# Patient Record
Sex: Female | Born: 1950 | Race: White | Hispanic: No | Marital: Single | Smoking: Former smoker
Health system: Southern US, Community
[De-identification: ages and names within clinical notes are randomized; demographics above are authoritative.]

## PROBLEM LIST (undated history)

## (undated) DIAGNOSIS — J449 Chronic obstructive pulmonary disease, unspecified: Secondary | ICD-10-CM

## (undated) DIAGNOSIS — K219 Gastro-esophageal reflux disease without esophagitis: Secondary | ICD-10-CM

## (undated) DIAGNOSIS — Z5189 Encounter for other specified aftercare: Secondary | ICD-10-CM

## (undated) DIAGNOSIS — Z9889 Other specified postprocedural states: Secondary | ICD-10-CM

## (undated) DIAGNOSIS — U071 COVID-19: Secondary | ICD-10-CM

## (undated) DIAGNOSIS — N3010 Interstitial cystitis (chronic) without hematuria: Secondary | ICD-10-CM

## (undated) DIAGNOSIS — N189 Chronic kidney disease, unspecified: Secondary | ICD-10-CM

## (undated) DIAGNOSIS — R350 Frequency of micturition: Secondary | ICD-10-CM

## (undated) DIAGNOSIS — R112 Nausea with vomiting, unspecified: Secondary | ICD-10-CM

## (undated) DIAGNOSIS — D649 Anemia, unspecified: Secondary | ICD-10-CM

## (undated) DIAGNOSIS — N390 Urinary tract infection, site not specified: Secondary | ICD-10-CM

## (undated) DIAGNOSIS — M797 Fibromyalgia: Secondary | ICD-10-CM

## (undated) DIAGNOSIS — G453 Amaurosis fugax: Secondary | ICD-10-CM

## (undated) DIAGNOSIS — F32A Depression, unspecified: Secondary | ICD-10-CM

## (undated) DIAGNOSIS — T7840XA Allergy, unspecified, initial encounter: Secondary | ICD-10-CM

## (undated) DIAGNOSIS — G9332 Myalgic encephalomyelitis/chronic fatigue syndrome: Secondary | ICD-10-CM

## (undated) DIAGNOSIS — Z8719 Personal history of other diseases of the digestive system: Secondary | ICD-10-CM

## (undated) DIAGNOSIS — G43909 Migraine, unspecified, not intractable, without status migrainosus: Secondary | ICD-10-CM

## (undated) DIAGNOSIS — E785 Hyperlipidemia, unspecified: Secondary | ICD-10-CM

## (undated) DIAGNOSIS — Z8709 Personal history of other diseases of the respiratory system: Secondary | ICD-10-CM

## (undated) DIAGNOSIS — R5382 Chronic fatigue, unspecified: Secondary | ICD-10-CM

## (undated) DIAGNOSIS — R0602 Shortness of breath: Secondary | ICD-10-CM

## (undated) DIAGNOSIS — K5792 Diverticulitis of intestine, part unspecified, without perforation or abscess without bleeding: Secondary | ICD-10-CM

## (undated) DIAGNOSIS — F329 Major depressive disorder, single episode, unspecified: Secondary | ICD-10-CM

## (undated) DIAGNOSIS — E079 Disorder of thyroid, unspecified: Secondary | ICD-10-CM

## (undated) DIAGNOSIS — E039 Hypothyroidism, unspecified: Secondary | ICD-10-CM

## (undated) DIAGNOSIS — R42 Dizziness and giddiness: Secondary | ICD-10-CM

## (undated) DIAGNOSIS — M5126 Other intervertebral disc displacement, lumbar region: Secondary | ICD-10-CM

## (undated) DIAGNOSIS — J189 Pneumonia, unspecified organism: Secondary | ICD-10-CM

## (undated) DIAGNOSIS — M1990 Unspecified osteoarthritis, unspecified site: Secondary | ICD-10-CM

## (undated) DIAGNOSIS — K589 Irritable bowel syndrome without diarrhea: Secondary | ICD-10-CM

## (undated) DIAGNOSIS — F419 Anxiety disorder, unspecified: Secondary | ICD-10-CM

## (undated) DIAGNOSIS — L900 Lichen sclerosus et atrophicus: Secondary | ICD-10-CM

## (undated) DIAGNOSIS — J439 Emphysema, unspecified: Secondary | ICD-10-CM

## (undated) HISTORY — DX: Hyperlipidemia, unspecified: E78.5

## (undated) HISTORY — DX: Chronic kidney disease, unspecified: N18.9

## (undated) HISTORY — DX: Depression, unspecified: F32.A

## (undated) HISTORY — DX: Encounter for other specified aftercare: Z51.89

## (undated) HISTORY — PX: TONSILLECTOMY: SUR1361

## (undated) HISTORY — PX: TEMPOROMANDIBULAR JOINT SURGERY: SHX35

## (undated) HISTORY — DX: Chronic fatigue, unspecified: R53.82

## (undated) HISTORY — DX: Emphysema, unspecified: J43.9

## (undated) HISTORY — PX: WRIST ARTHROSCOPY: SUR100

## (undated) HISTORY — DX: Major depressive disorder, single episode, unspecified: F32.9

## (undated) HISTORY — DX: Amaurosis fugax: G45.3

## (undated) HISTORY — PX: TRIGGER FINGER RELEASE: SHX641

## (undated) HISTORY — PX: PLANTAR FASCIA SURGERY: SHX746

## (undated) HISTORY — DX: COVID-19: U07.1

## (undated) HISTORY — PX: JOINT REPLACEMENT: SHX530

## (undated) HISTORY — DX: Chronic obstructive pulmonary disease, unspecified: J44.9

## (undated) HISTORY — DX: Other intervertebral disc displacement, lumbar region: M51.26

## (undated) HISTORY — DX: Gastro-esophageal reflux disease without esophagitis: K21.9

## (undated) HISTORY — PX: DOPPLER ECHOCARDIOGRAPHY: SHX263

## (undated) HISTORY — DX: Migraine, unspecified, not intractable, without status migrainosus: G43.909

## (undated) HISTORY — DX: Pneumonia, unspecified organism: J18.9

## (undated) HISTORY — PX: ABDOMINAL HYSTERECTOMY: SHX81

## (undated) HISTORY — PX: EYE SURGERY: SHX253

## (undated) HISTORY — DX: Irritable bowel syndrome, unspecified: K58.9

## (undated) HISTORY — PX: FOOT SURGERY: SHX648

## (undated) HISTORY — DX: Disorder of thyroid, unspecified: E07.9

## (undated) HISTORY — DX: Allergy, unspecified, initial encounter: T78.40XA

## (undated) HISTORY — DX: Myalgic encephalomyelitis/chronic fatigue syndrome: G93.32

## (undated) HISTORY — PX: OTHER SURGICAL HISTORY: SHX169

## (undated) HISTORY — PX: KNEE SURGERY: SHX244

## (undated) HISTORY — DX: Anxiety disorder, unspecified: F41.9

---

## 1998-05-10 ENCOUNTER — Emergency Department (HOSPITAL_COMMUNITY): Admission: EM | Admit: 1998-05-10 | Discharge: 1998-05-10 | Payer: Self-pay | Admitting: Emergency Medicine

## 1998-05-10 ENCOUNTER — Encounter: Payer: Self-pay | Admitting: Emergency Medicine

## 1999-03-30 ENCOUNTER — Encounter: Payer: Self-pay | Admitting: Family Medicine

## 1999-03-30 ENCOUNTER — Inpatient Hospital Stay (HOSPITAL_COMMUNITY): Admission: AD | Admit: 1999-03-30 | Discharge: 1999-04-01 | Payer: Self-pay | Admitting: Family Medicine

## 1999-07-12 ENCOUNTER — Encounter: Admission: RE | Admit: 1999-07-12 | Discharge: 1999-08-01 | Payer: Self-pay | Admitting: Family Medicine

## 2000-01-12 ENCOUNTER — Encounter: Payer: Self-pay | Admitting: Family Medicine

## 2000-01-12 ENCOUNTER — Encounter: Admission: RE | Admit: 2000-01-12 | Discharge: 2000-01-12 | Payer: Self-pay | Admitting: Family Medicine

## 2001-01-20 ENCOUNTER — Encounter: Payer: Self-pay | Admitting: Family Medicine

## 2001-01-20 ENCOUNTER — Encounter: Admission: RE | Admit: 2001-01-20 | Discharge: 2001-01-20 | Payer: Self-pay | Admitting: Family Medicine

## 2001-03-07 ENCOUNTER — Emergency Department (HOSPITAL_COMMUNITY): Admission: EM | Admit: 2001-03-07 | Discharge: 2001-03-07 | Payer: Self-pay

## 2002-01-26 ENCOUNTER — Encounter: Payer: Self-pay | Admitting: Family Medicine

## 2002-01-26 ENCOUNTER — Encounter: Admission: RE | Admit: 2002-01-26 | Discharge: 2002-01-26 | Payer: Self-pay | Admitting: Family Medicine

## 2002-10-09 ENCOUNTER — Emergency Department (HOSPITAL_COMMUNITY): Admission: EM | Admit: 2002-10-09 | Discharge: 2002-10-10 | Payer: Self-pay | Admitting: Emergency Medicine

## 2003-01-13 ENCOUNTER — Encounter: Admission: RE | Admit: 2003-01-13 | Discharge: 2003-01-13 | Payer: Self-pay | Admitting: Family Medicine

## 2003-12-31 ENCOUNTER — Ambulatory Visit: Payer: Self-pay | Admitting: Family Medicine

## 2004-01-07 ENCOUNTER — Encounter: Admission: RE | Admit: 2004-01-07 | Discharge: 2004-01-07 | Payer: Self-pay | Admitting: Family Medicine

## 2004-01-25 ENCOUNTER — Ambulatory Visit: Payer: Self-pay | Admitting: Family Medicine

## 2004-01-28 ENCOUNTER — Ambulatory Visit: Payer: Self-pay | Admitting: Family Medicine

## 2004-04-03 ENCOUNTER — Ambulatory Visit: Payer: Self-pay | Admitting: Family Medicine

## 2004-04-04 ENCOUNTER — Encounter: Admission: RE | Admit: 2004-04-04 | Discharge: 2004-04-04 | Payer: Self-pay | Admitting: Family Medicine

## 2004-05-17 ENCOUNTER — Ambulatory Visit: Payer: Self-pay | Admitting: Family Medicine

## 2004-08-16 ENCOUNTER — Ambulatory Visit: Payer: Self-pay | Admitting: Family Medicine

## 2004-10-03 ENCOUNTER — Ambulatory Visit: Payer: Self-pay | Admitting: Family Medicine

## 2004-12-12 ENCOUNTER — Ambulatory Visit: Payer: Self-pay | Admitting: Family Medicine

## 2005-01-09 ENCOUNTER — Ambulatory Visit: Payer: Self-pay | Admitting: Family Medicine

## 2005-01-23 ENCOUNTER — Ambulatory Visit: Payer: Self-pay | Admitting: Family Medicine

## 2005-04-10 ENCOUNTER — Ambulatory Visit: Payer: Self-pay | Admitting: Family Medicine

## 2005-04-24 ENCOUNTER — Encounter: Admission: RE | Admit: 2005-04-24 | Discharge: 2005-04-24 | Payer: Self-pay | Admitting: Family Medicine

## 2005-04-26 ENCOUNTER — Ambulatory Visit: Payer: Self-pay | Admitting: Family Medicine

## 2005-05-15 ENCOUNTER — Encounter: Admission: RE | Admit: 2005-05-15 | Discharge: 2005-05-15 | Payer: Self-pay | Admitting: Family Medicine

## 2005-07-18 ENCOUNTER — Ambulatory Visit: Payer: Self-pay | Admitting: Family Medicine

## 2005-12-04 ENCOUNTER — Ambulatory Visit: Payer: Self-pay | Admitting: Family Medicine

## 2005-12-04 ENCOUNTER — Encounter: Admission: RE | Admit: 2005-12-04 | Discharge: 2005-12-04 | Payer: Self-pay | Admitting: Family Medicine

## 2006-01-08 ENCOUNTER — Ambulatory Visit: Payer: Self-pay | Admitting: Family Medicine

## 2006-01-22 ENCOUNTER — Ambulatory Visit: Payer: Self-pay | Admitting: Family Medicine

## 2006-01-29 ENCOUNTER — Ambulatory Visit: Payer: Self-pay | Admitting: Family Medicine

## 2006-03-19 ENCOUNTER — Ambulatory Visit: Payer: Self-pay | Admitting: Family Medicine

## 2006-04-10 ENCOUNTER — Ambulatory Visit: Payer: Self-pay | Admitting: Internal Medicine

## 2006-04-16 ENCOUNTER — Ambulatory Visit: Payer: Self-pay | Admitting: Family Medicine

## 2006-05-03 ENCOUNTER — Encounter: Admission: RE | Admit: 2006-05-03 | Discharge: 2006-05-03 | Payer: Self-pay | Admitting: Family Medicine

## 2006-05-07 ENCOUNTER — Ambulatory Visit: Payer: Self-pay | Admitting: Family Medicine

## 2006-05-10 ENCOUNTER — Ambulatory Visit: Payer: Self-pay | Admitting: Internal Medicine

## 2006-07-09 ENCOUNTER — Telehealth (INDEPENDENT_AMBULATORY_CARE_PROVIDER_SITE_OTHER): Payer: Self-pay | Admitting: *Deleted

## 2006-09-17 ENCOUNTER — Ambulatory Visit: Payer: Self-pay | Admitting: Internal Medicine

## 2006-12-10 ENCOUNTER — Ambulatory Visit: Payer: Self-pay | Admitting: Family Medicine

## 2006-12-10 DIAGNOSIS — E7800 Pure hypercholesterolemia, unspecified: Secondary | ICD-10-CM | POA: Insufficient documentation

## 2006-12-10 DIAGNOSIS — R5382 Chronic fatigue, unspecified: Secondary | ICD-10-CM

## 2006-12-16 LAB — CONVERTED CEMR LAB
AST: 19 units/L (ref 0–37)
Albumin: 3.5 g/dL (ref 3.5–5.2)
Basophils Absolute: 0 10*3/uL (ref 0.0–0.1)
Bilirubin, Direct: 0.1 mg/dL (ref 0.0–0.3)
Cholesterol: 154 mg/dL (ref 0–200)
Creatinine, Ser: 0.9 mg/dL (ref 0.4–1.2)
Eosinophils Relative: 2.3 % (ref 0.0–5.0)
Glucose, Bld: 86 mg/dL (ref 70–99)
HCT: 35.8 % — ABNORMAL LOW (ref 36.0–46.0)
Hemoglobin: 12.3 g/dL (ref 12.0–15.0)
MCHC: 34.4 g/dL (ref 30.0–36.0)
MCV: 94.8 fL (ref 78.0–100.0)
Monocytes Absolute: 0.4 10*3/uL (ref 0.2–0.7)
Neutrophils Relative %: 34.6 % — ABNORMAL LOW (ref 43.0–77.0)
Potassium: 4.4 meq/L (ref 3.5–5.1)
RDW: 14.3 % (ref 11.5–14.6)
Sodium: 143 meq/L (ref 135–145)
TSH: 2.04 microintl units/mL (ref 0.35–5.50)
Total Bilirubin: 0.6 mg/dL (ref 0.3–1.2)
Total Protein: 6.2 g/dL (ref 6.0–8.3)
WBC: 5.4 10*3/uL (ref 4.5–10.5)

## 2006-12-20 ENCOUNTER — Ambulatory Visit: Payer: Self-pay | Admitting: Family Medicine

## 2007-03-27 ENCOUNTER — Ambulatory Visit: Payer: Self-pay | Admitting: Internal Medicine

## 2007-03-27 DIAGNOSIS — J449 Chronic obstructive pulmonary disease, unspecified: Secondary | ICD-10-CM | POA: Insufficient documentation

## 2007-05-07 ENCOUNTER — Ambulatory Visit: Payer: Self-pay | Admitting: Family Medicine

## 2007-05-07 DIAGNOSIS — H3400 Transient retinal artery occlusion, unspecified eye: Secondary | ICD-10-CM | POA: Insufficient documentation

## 2007-05-08 ENCOUNTER — Ambulatory Visit: Payer: Self-pay | Admitting: Vascular Surgery

## 2007-05-20 DIAGNOSIS — K224 Dyskinesia of esophagus: Secondary | ICD-10-CM

## 2007-05-20 DIAGNOSIS — F411 Generalized anxiety disorder: Secondary | ICD-10-CM | POA: Insufficient documentation

## 2007-05-20 DIAGNOSIS — F609 Personality disorder, unspecified: Secondary | ICD-10-CM | POA: Insufficient documentation

## 2007-05-20 DIAGNOSIS — F329 Major depressive disorder, single episode, unspecified: Secondary | ICD-10-CM

## 2007-05-20 DIAGNOSIS — F319 Bipolar disorder, unspecified: Secondary | ICD-10-CM

## 2007-05-20 DIAGNOSIS — K449 Diaphragmatic hernia without obstruction or gangrene: Secondary | ICD-10-CM

## 2007-05-21 ENCOUNTER — Ambulatory Visit: Payer: Self-pay | Admitting: Family Medicine

## 2007-05-22 ENCOUNTER — Telehealth: Payer: Self-pay | Admitting: Family Medicine

## 2007-05-23 ENCOUNTER — Encounter: Payer: Self-pay | Admitting: Family Medicine

## 2007-05-23 ENCOUNTER — Ambulatory Visit: Payer: Self-pay

## 2007-06-17 ENCOUNTER — Ambulatory Visit: Payer: Self-pay | Admitting: Family Medicine

## 2007-06-18 LAB — CONVERTED CEMR LAB
ALT: 16 units/L (ref 0–35)
Albumin: 3.9 g/dL (ref 3.5–5.2)
BUN: 17 mg/dL (ref 6–23)
Basophils Absolute: 0 10*3/uL (ref 0.0–0.1)
Basophils Relative: 0.1 % (ref 0.0–1.0)
Cholesterol: 167 mg/dL (ref 0–200)
Creatinine, Ser: 1.2 mg/dL (ref 0.4–1.2)
Eosinophils Absolute: 0.1 10*3/uL (ref 0.0–0.7)
Eosinophils Relative: 2.4 % (ref 0.0–5.0)
GFR calc Af Amer: 60 mL/min
HCT: 39.7 % (ref 36.0–46.0)
Hemoglobin: 13.4 g/dL (ref 12.0–15.0)
MCHC: 33.8 g/dL (ref 30.0–36.0)
MCV: 96.2 fL (ref 78.0–100.0)
Monocytes Absolute: 0.5 10*3/uL (ref 0.1–1.0)
Neutro Abs: 1.8 10*3/uL (ref 1.4–7.7)
Phosphorus: 4.3 mg/dL (ref 2.3–4.6)
RBC: 4.12 M/uL (ref 3.87–5.11)
VLDL: 17 mg/dL (ref 0–40)
WBC: 6 10*3/uL (ref 4.5–10.5)

## 2007-06-26 ENCOUNTER — Encounter: Admission: RE | Admit: 2007-06-26 | Discharge: 2007-06-26 | Payer: Self-pay | Admitting: Neurology

## 2007-09-18 ENCOUNTER — Ambulatory Visit: Payer: Self-pay | Admitting: Family Medicine

## 2007-09-19 LAB — CONVERTED CEMR LAB
AST: 20 units/L (ref 0–37)
HDL: 52.9 mg/dL (ref >39.0)
Total CHOL/HDL Ratio: 3.2
Triglycerides: 74 mg/dL (ref 0–149)
VLDL: 15 mg/dL (ref 0–40)

## 2007-09-25 ENCOUNTER — Ambulatory Visit: Payer: Self-pay | Admitting: Internal Medicine

## 2007-10-09 ENCOUNTER — Ambulatory Visit: Payer: Self-pay | Admitting: Family Medicine

## 2007-10-09 DIAGNOSIS — N951 Menopausal and female climacteric states: Secondary | ICD-10-CM

## 2007-10-16 ENCOUNTER — Ambulatory Visit: Payer: Self-pay | Admitting: Family Medicine

## 2007-10-22 LAB — CONVERTED CEMR LAB
BUN: 12 mg/dL (ref 6–23)
Calcium: 9.4 mg/dL (ref 8.4–10.5)
Chloride: 109 meq/L (ref 96–112)
Creatinine, Ser: 1 mg/dL (ref 0.4–1.2)
Glucose, Bld: 81 mg/dL (ref 70–99)
Phosphorus: 3.9 mg/dL (ref 2.3–4.6)
Potassium: 4.8 meq/L (ref 3.5–5.1)

## 2007-10-28 ENCOUNTER — Encounter: Payer: Self-pay | Admitting: Family Medicine

## 2008-02-03 ENCOUNTER — Ambulatory Visit: Payer: Self-pay | Admitting: Family Medicine

## 2008-02-04 LAB — CONVERTED CEMR LAB
Albumin: 3.8 g/dL (ref 3.5–5.2)
BUN: 19 mg/dL (ref 6–23)
Calcium: 9.6 mg/dL (ref 8.4–10.5)
Cholesterol: 148 mg/dL (ref 0–200)
Creatinine, Ser: 1.1 mg/dL (ref 0.4–1.2)
GFR calc Af Amer: 66 mL/min
LDL Cholesterol: 85 mg/dL (ref 0–99)
Phosphorus: 4.3 mg/dL (ref 2.3–4.6)
Total CHOL/HDL Ratio: 2.9
Triglycerides: 63 mg/dL (ref 0–149)
VLDL: 13 mg/dL (ref 0–40)

## 2008-03-09 ENCOUNTER — Ambulatory Visit: Payer: Self-pay | Admitting: Family Medicine

## 2008-03-09 DIAGNOSIS — R259 Unspecified abnormal involuntary movements: Secondary | ICD-10-CM | POA: Insufficient documentation

## 2008-03-12 ENCOUNTER — Ambulatory Visit: Payer: Self-pay | Admitting: Internal Medicine

## 2008-03-23 ENCOUNTER — Ambulatory Visit: Payer: Self-pay | Admitting: Family Medicine

## 2008-09-28 ENCOUNTER — Ambulatory Visit: Payer: Self-pay | Admitting: Family Medicine

## 2008-11-25 ENCOUNTER — Ambulatory Visit: Payer: Self-pay | Admitting: Family Medicine

## 2009-02-07 ENCOUNTER — Ambulatory Visit: Payer: Self-pay | Admitting: Family Medicine

## 2009-02-08 LAB — CONVERTED CEMR LAB
ALT: 15 units/L (ref 0–35)
Calcium: 9.5 mg/dL (ref 8.4–10.5)
Creatinine, Ser: 1 mg/dL (ref 0.4–1.2)
GFR calc non Af Amer: 60.39 mL/min (ref >60)
Glucose, Bld: 86 mg/dL (ref 70–99)
HDL: 49 mg/dL (ref >39.00)
LDL Cholesterol: 83 mg/dL (ref 0–99)
Phosphorus: 4.2 mg/dL (ref 2.3–4.6)
Potassium: 3.9 meq/L (ref 3.5–5.1)
Sodium: 144 meq/L (ref 135–145)
Total CHOL/HDL Ratio: 3
VLDL: 18.6 mg/dL (ref 0.0–40.0)

## 2009-03-11 ENCOUNTER — Telehealth: Payer: Self-pay | Admitting: Internal Medicine

## 2009-03-11 ENCOUNTER — Ambulatory Visit: Payer: Self-pay | Admitting: Internal Medicine

## 2009-04-08 ENCOUNTER — Ambulatory Visit: Payer: Self-pay | Admitting: Family Medicine

## 2009-04-13 ENCOUNTER — Telehealth: Payer: Self-pay | Admitting: Family Medicine

## 2009-04-14 ENCOUNTER — Telehealth: Payer: Self-pay | Admitting: Family Medicine

## 2009-04-18 ENCOUNTER — Telehealth: Payer: Self-pay | Admitting: Family Medicine

## 2009-04-18 ENCOUNTER — Ambulatory Visit: Payer: Self-pay | Admitting: Family Medicine

## 2009-04-29 ENCOUNTER — Telehealth: Payer: Self-pay | Admitting: Family Medicine

## 2009-05-02 ENCOUNTER — Encounter: Payer: Self-pay | Admitting: Family Medicine

## 2009-05-17 ENCOUNTER — Ambulatory Visit: Payer: Self-pay | Admitting: Internal Medicine

## 2009-05-26 ENCOUNTER — Encounter: Admission: RE | Admit: 2009-05-26 | Discharge: 2009-05-26 | Payer: Self-pay | Admitting: Family Medicine

## 2009-05-27 ENCOUNTER — Encounter: Payer: Self-pay | Admitting: Family Medicine

## 2009-08-05 ENCOUNTER — Ambulatory Visit: Payer: Self-pay | Admitting: Family Medicine

## 2009-08-05 DIAGNOSIS — G609 Hereditary and idiopathic neuropathy, unspecified: Secondary | ICD-10-CM | POA: Insufficient documentation

## 2009-08-05 DIAGNOSIS — M549 Dorsalgia, unspecified: Secondary | ICD-10-CM | POA: Insufficient documentation

## 2009-08-05 DIAGNOSIS — M79609 Pain in unspecified limb: Secondary | ICD-10-CM

## 2009-08-05 DIAGNOSIS — R252 Cramp and spasm: Secondary | ICD-10-CM | POA: Insufficient documentation

## 2009-08-05 DIAGNOSIS — M545 Low back pain, unspecified: Secondary | ICD-10-CM | POA: Insufficient documentation

## 2009-08-08 ENCOUNTER — Encounter: Payer: Self-pay | Admitting: Family Medicine

## 2009-08-09 ENCOUNTER — Telehealth: Payer: Self-pay | Admitting: Family Medicine

## 2009-08-10 LAB — CONVERTED CEMR LAB
BUN: 20 mg/dL (ref 6–23)
Basophils Absolute: 0 10*3/uL (ref 0.0–0.1)
Basophils Relative: 1 % (ref 0–1)
CO2: 24 meq/L (ref 19–32)
Chloride: 105 meq/L (ref 96–112)
Creatinine, Ser: 1.13 mg/dL (ref 0.40–1.20)
Eosinophils Absolute: 0.2 10*3/uL (ref 0.0–0.7)
MCHC: 32.8 g/dL (ref 30.0–36.0)
MCV: 93.7 fL (ref 78.0–100.0)
Monocytes Relative: 10 % (ref 3–12)
Neutrophils Relative %: 48 % (ref 43–77)
RBC: 3.78 M/uL — ABNORMAL LOW (ref 3.87–5.11)
RDW: 14.7 % (ref 11.5–15.5)
Vitamin B-12: 298 pg/mL (ref 211–911)

## 2009-08-26 ENCOUNTER — Ambulatory Visit: Payer: Self-pay | Admitting: Family Medicine

## 2009-08-30 ENCOUNTER — Encounter: Payer: Self-pay | Admitting: Family Medicine

## 2009-08-30 LAB — CONVERTED CEMR LAB: OCCULT 3: NEGATIVE

## 2009-09-08 ENCOUNTER — Ambulatory Visit: Payer: Self-pay | Admitting: Internal Medicine

## 2009-10-11 ENCOUNTER — Ambulatory Visit: Payer: Self-pay | Admitting: Family Medicine

## 2009-10-11 ENCOUNTER — Encounter (INDEPENDENT_AMBULATORY_CARE_PROVIDER_SITE_OTHER): Payer: Self-pay | Admitting: *Deleted

## 2009-10-11 DIAGNOSIS — D649 Anemia, unspecified: Secondary | ICD-10-CM | POA: Insufficient documentation

## 2009-10-13 LAB — CONVERTED CEMR LAB
CO2: 29 meq/L (ref 19–32)
Chloride: 102 meq/L (ref 96–112)
Eosinophils Relative: 2.1 % (ref 0.0–5.0)
Ferritin: 90.3 ng/mL (ref 10.0–291.0)
GFR calc non Af Amer: 58.89 mL/min (ref >60)
HCT: 34.9 % — ABNORMAL LOW (ref 36.0–46.0)
Hemoglobin: 12 g/dL (ref 12.0–15.0)
Lymphs Abs: 2.8 10*3/uL (ref 0.7–4.0)
Monocytes Relative: 8.9 % (ref 3.0–12.0)
Neutro Abs: 2.2 10*3/uL (ref 1.4–7.7)
Sodium: 141 meq/L (ref 135–145)
WBC: 5.6 10*3/uL (ref 4.5–10.5)

## 2009-10-15 ENCOUNTER — Encounter: Admission: RE | Admit: 2009-10-15 | Discharge: 2009-10-15 | Payer: Self-pay | Admitting: Orthopedic Surgery

## 2009-10-18 ENCOUNTER — Encounter: Admission: RE | Admit: 2009-10-18 | Discharge: 2009-10-18 | Payer: Self-pay | Admitting: Orthopedic Surgery

## 2009-11-08 ENCOUNTER — Ambulatory Visit: Payer: Self-pay | Admitting: Obstetrics & Gynecology

## 2009-11-29 ENCOUNTER — Ambulatory Visit: Payer: Self-pay | Admitting: Gastroenterology

## 2009-12-19 ENCOUNTER — Encounter: Admission: RE | Admit: 2009-12-19 | Discharge: 2010-01-04 | Payer: Self-pay | Admitting: Orthopedic Surgery

## 2010-02-22 ENCOUNTER — Ambulatory Visit
Admission: RE | Admit: 2010-02-22 | Discharge: 2010-02-22 | Payer: Self-pay | Source: Home / Self Care | Attending: Obstetrics & Gynecology | Admitting: Obstetrics & Gynecology

## 2010-03-07 ENCOUNTER — Ambulatory Visit
Admission: RE | Admit: 2010-03-07 | Discharge: 2010-03-07 | Payer: Self-pay | Source: Home / Self Care | Attending: Internal Medicine | Admitting: Internal Medicine

## 2010-03-07 DIAGNOSIS — F17200 Nicotine dependence, unspecified, uncomplicated: Secondary | ICD-10-CM | POA: Insufficient documentation
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
## 2010-03-28 NOTE — Letter (Signed)
Summary: Results Follow up Letter  [facility]card at [facility]  [address] [place], [place] [address]   Phone: [phone]  Fax: [phone]    08/30/2009 MRN: [id number]    TIOMBE TOMEO [address] [place], [place]  [address]    Dear Ms. Oberry,  The following are the results of your recent test(s):  Test         Result    Pap Smear:        Normal _____  Not Normal _____ Comments: ______________________________________________________ Cholesterol: LDL(Bad cholesterol):         Your goal is less than:         HDL (Good cholesterol):       Your goal is more than: Comments:  ______________________________________________________ Mammogram:        Normal _____  Not Normal _____ Comments:  ___________________________________________________________________ Hemoccult:        Normal __X___  Not normal _______ Comments: Will discuss in more detail at follow up appt.  _____________________________________________________________________ Other Tests:    We routinely do not discuss normal results over the telephone.  If you desire a copy of the results, or you have any questions about this information we can discuss them at your next office visit.   Sincerely,    Idamae Schuller Tiara Bartoli,MD  MT/ri

## 2010-03-28 NOTE — Progress Notes (Signed)
Summary: flovent FYI  Phone Note Call from Patient Call back at Home Phone [phone]   Caller: Patient Call For: young Summary of Call: pt states that her flovent is 44.  Initial call taken by: Tivis Ringer,  March 11, 2009 3:07 PM  Follow-up for Phone Call        pt states CY wanted her to call back about the strength of her flovent. Pt states she has Flovent and uses it as needed. FYI. Carron Curie CMA  March 11, 2009 3:14 PM   Additional Follow-up for Phone Call Additional follow up Details #1::        Updated on pt's med list.Katie [facility] CMA  March 11, 2009 4:11 PM

## 2010-03-28 NOTE — Miscellaneous (Signed)
Summary: Orders Update pft charges  Clinical Lists Changes  Orders: Added new Service order of Carbon Monoxide diffusing w/capacity (94720) - Signed Added new Service order of Lung Volumes (94240) - Signed Added new Service order of Spirometry (Pre & Post) (94060) - Signed 

## 2010-03-28 NOTE — Assessment & Plan Note (Signed)
Summary: 6 MONTH FOLLOW UP/[facility] r/s from 2/7   Vital Signs:  Patient profile:   60 year old female Height:      67 inches Weight:      177.50 pounds BMI:     27.90 Temp:     98.8 degrees F oral Pulse rate:   92 / minute Pulse rhythm:   regular BP sitting:   118 / 68  (left arm) Cuff size:   regular  Vitals Entered By: Lewanda Rife LPN (April 08, 2009 3:30 PM)  History of Present Illness: here for f/u of lipids/thyroid/ copd / smoking   is feeling ok overall   wt is stable today  bp good 118/68 no probs with that   lipids in fair control with trig 93/ HDL 49 and LDL 83 diet is overall about the same/ fair  zocor- is working well   thyroid test is nl  hx of hyperthyroid with tx by Dr Juleen China  is really hungry in the evenings -- does not eat as much during the day because not hungry  has had thyroid nodules in the past - was not told they needed to be followed  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 Penicillin 2)  ! Erythromycin 3)  ! Restasis (Cyclosporine) 4)  Aciphex  Past History:  Past Medical History: Last updated: 03/12/2008 hospitalized for depression with disability for conversion disorder chronic fatigue syndrome irritable bowel COPD- tob abuse pneumonia RLL- hosp 1995 migraine 3/09 amaurosis fugax hyperlipidemia opthy: Dr Dagoberto Ligas Hyperthyroidism Anxiety  Past Surgical History: Last updated: 09/28/2008 bilateral TMJ surgery Hysterectomy tonsilectomy Dexa- osteopenia femoral neck (02/2001) Thyroid nodules (12/2003) Epicondylitis- injection (12/2003) Foot surgery (12/2005) pneumonia with outpt tx (12/09) carotid  dopplers nl 3/09 echocardiogram 3/09  Family History: Last updated: 10/06/2007 Faather- bladder cancer Mother -thyroid disease, hypertension, renal failure, carotid stenosis, smoker/copd grandmother had rheumatism 2 siblings in good health  Social History: Last updated: 03/11/2009 Patient is a current smoker 1 ppd against repeated counseling. on disability since 1988 single no children  Risk Factors: Smoking Status: quit (05/20/2007) Packs/Day: 1 1/2 (03/27/2007)  Review of Systems General:  Denies fatigue, fever, loss of appetite, and malaise. Eyes:  Denies blurring and eye pain. CV:  Denies chest pain or discomfort, palpitations, and shortness of breath with exertion. Resp:  Denies cough and wheezing. GI:  Denies abdominal pain, bloody stools, change in bowel habits, indigestion, and nausea. MS:  Complains of joint pain, muscle aches, and stiffness; denies muscle weakness. Derm:  Denies lesion(s), poor wound healing, and rash. Neuro:  Denies numbness and tingling. Psych:  Complains of depression. Endo:  Denies cold intolerance, excessive thirst, excessive urination, and heat intolerance. Heme:  Denies abnormal bruising and bleeding.  Physical Exam  General:  fatigued but overall well appearing Head:  normocephalic, atraumatic, and no abnormalities observed.   Eyes:  vision grossly intact, pupils equal, pupils round, and pupils reactive to light.  no conjunctival pallor, injection or icterus  Ears:  R ear normal and L ear normal.   Nose:  no nasal discharge.  Mouth:  pharynx pink and moist.   Neck:  supple with full rom and no masses or thyromegally, no JVD or carotid bruit  Chest Wall:  No deformities, masses, or tenderness noted. Lungs:  prolonged exp phase with slt exp wheeze today no rales or crackles  Heart:  RRR with no M Abdomen:  Bowel sounds positive,abdomen soft and non-tender without masses, organomegaly or hernias noted. Msk:  trigger points noted  no  acute joint changes  Pulses:  R and L carotid,radial,femoral,dorsalis pedis and posterior tibial pulses are full and equal bilaterally Extremities:  No clubbing, cyanosis, edema, or deformity noted with normal full range of motion of all joints.   Neurologic:  sensation intact to light touch, gait normal, and DTRs symmetrical and normal.   Skin:  Intact without suspicious lesions or rashes Cervical Nodes:  No lymphadenopathy noted Psych:  normal affect, talkative and pleasant    Impression & Recommendations:  Problem # 1:  HYPERTHYROIDISM (ICD-242.90) Assessment Unchanged hx of graves that is in remission - without treatment  remote hx of thyroid nodules that did not change under care of endo no clinical changes  will continue to follow  lab rev today- stable tsh  Problem # 2:  TOBACCO USE (ICD-305.1) Assessment: Unchanged  discussed in detail risks of smoking, and possible outcomes including COPD, vascular dz, cancer and also respiratory infections/sinus problems  - pt voiced understanding talked in detail about reasons to quit and expectations may want to try chantix again  will disc with her psychologist and call for px when ready  Orders: Tobacco use cessation intermediate 3-10 minutes (99406)  Problem # 3:  HYPERCHOLESTEROLEMIA, PURE (ICD-272.0) Assessment: Unchanged  very well controlled with diet and zocor no change in this  disc imp of control in light of other vascular risk factors  Her updated medication list for this problem includes:    Zocor 20 Mg Tabs (Simvastatin) .Marland Kitchen... Take one by mouth daily  Labs Reviewed: SGOT: 22 (02/07/2009)   SGPT: 15 (02/07/2009)   HDL:49.00 (02/07/2009), 50.5 (02/03/2008)  LDL:83 (02/07/2009), 85 (02/03/2008)  Chol:151 (02/07/2009), 148 (02/03/2008)  Trig:93.0 (02/07/2009), 63 (02/03/2008)  Complete Medication List: 1)  Zoloft 25 Mg Tabs (Sertraline hcl) .... Take 1 by mouth every other day 2)  Remeron 30 Mg Tabs (Mirtazapine) ....  Take 1 tablet by mouth once a day 3)  Depakote Er 500 Mg Tb24 (Divalproex sodium) .... Take 1 tablet by mouth once a day 4)  Wellbutrin Xl 150 Mg Tb24 (Bupropion hcl) .... Take 1 tablet by mouth once a day 5)  Buspar 15 Mg Tabs (Buspirone hcl) .... One tab twice daily 6)  Spiriva Handihaler 18 Mcg Caps (Tiotropium bromide monohydrate) .... Once daily 7)  Flonase 50 Mcg/act Susp (Fluticasone propionate) .... 2 sprays in each nostril once daily 8)  Proair Hfa 108 (90 Base) Mcg/act Aers (Albuterol sulfate) .... 2 puffs 4 x daily as needed 9)  Ativan 1 Mg Tabs (Lorazepam) .... Prn 10)  Zocor 20 Mg Tabs (Simvastatin) .... Take one by mouth daily 11)  Adult Aspirin Low Strength 81 Mg Tbdp (Aspirin) .... Take 1 by mouth once daily 12)  Flexeril 10 Mg Tabs (Cyclobenzaprine hcl) .... 1/2 to 1 by mouth up to three times a day as needed back pain 13)  Cymbalta 60 Mg Cpep (Duloxetine hcl) .... Take 1 by mouth once daily 14)  Flovent Hfa 44 Mcg/act Aero (Fluticasone propionate  hfa) .Marland Kitchen.. 1puff two times a day as needed 15)  Fish Oil 1000 Mg Caps (Omega-3 fatty acids) .... Takes two daily  Patient Instructions: 1)  thyroid and and cholesterol are ok and stable  2)  keep working on smoking  3)  send me paperwork for chantix when needed  4)  follow up with me in about 6 months for 30 min check up  Prescriptions: FLEXERIL 10 MG TABS (CYCLOBENZAPRINE HCL) 1/2 to 1 by mouth up to three times a day as needed back pain  #30 x 0   Entered and Authorized by:   Judith Part MD   Signed by:   Judith Part MD on 04/08/2009   Method used:   Print then Give to Patient   RxID:   [id number]   Current Allergies (reviewed today): ! PENICILLIN ! ERYTHROMYCIN ! RESTASIS (CYCLOSPORINE) ACIPHEX

## 2010-03-28 NOTE — Assessment & Plan Note (Signed)
Summary: 6 min walk   Nurse Visit   Vital Signs:  Patient profile:   60 year old female Pulse rate:   96 / minute BP sitting:   124 / 64  Allergies: 1)  ! Penicillin 2)  ! Erythromycin 3)  ! Restasis (Cyclosporine) 4)  Aciphex  Orders Added: 1)  Pulmonary Stress (6 min walk) [94620]   Six Minute Walk Test Medications taken before test(dose and time): chantix at 0700 Supplemental oxygen during the test: No  Lap counter(place a tick mark inside a square for each lap completed) lap 1 complete  lap 2 complete   lap 3 complete   lap 4 complete  lap 5 complete  lap 6 complete  lap 7 complete    Baseline  BP sitting: 124/ 64 Heart rate: 96 Dyspnea ( Borg scale) 0 Fatigue (Borg scale) 3 SPO2 97 ra  End Of Test  BP sitting: 148/ 58 Heart rate: 122 Dyspnea ( Borg scale) 4 Fatigue (Borg scale) 6 SPO2 97 ra  2 Minutes post  BP sitting: 122/ 64 Heart rate: 104 SPO2 98 ra  Stopped or paused before six minutes? Yes Reason: 3:27sec - 2:51sec d/t tightness/heaviness in chest.  Interpretation: Number of laps  7 X 48 meters =   336 meters+ final partial lap: 23 meters =    359 meters   Total distance walked in six minutes: 359 meters  Tech ID: Boone Master CNA (May 17, 2009 3:14 PM) Tech Comments pt completed test w/ 1 rest break d/t tightness/heaviness in chest.

## 2010-03-28 NOTE — Medication Information (Signed)
Summary: Approval for Chantix/[employer]  Approval for Chantix/[employer]   Imported By: Lanelle Bal 05/10/2009 08:57:50  _____________________________________________________________________  External Attachment:    Type:   Image     Comment:   External Document

## 2010-03-28 NOTE — Progress Notes (Signed)
Summary: Chantix not covered under Medicare  Phone Note From Pharmacy Call back at [phone]   Caller: [employer] Call For: Dr. Milinda Antis  Summary of Call: Received form from [employer] stating that Chantix is not a covered medication under Medicare's approved prescription drug plan.  Would like for the provide to either choose an alternative medication or request a coverage review.  Please advise.  I will sent for coverage review information if you would like me to.  Please advise.  Form in your IN box. Initial call taken by: Linde Gillis CMA Duncan Dull),  April 29, 2009 4:20 PM  Follow-up for Phone Call        can you please call for prior auth form from [facility]-- the toll free number is on form- I will put in your box thanks  Follow-up by: Judith Part MD,  April 29, 2009 5:22 PM  Additional Follow-up for Phone Call Additional follow up Details #1::        Called [employer] to get PA forms faxed to our office.  Case# [id number].  [employer] case # [id number].  Spoke to Alexa.  thanks  form done and in nurse in box  Additional Follow-up by: Linde Gillis CMA Duncan Dull),  May 02, 2009 11:08 AM    Additional Follow-up for Phone Call Additional follow up Details #2::    Received PA forms, in your IN box.  Linde Gillis CMA Duncan Dull)  May 02, 2009 3:01 PM   Additional Follow-up for Phone Call Additional follow up Details #3:: Details for Additional Follow-up Action Taken: Complete forms faxed to [phone] as instructed. Original forms given to [facility] incase needed at later time.Lewanda Rife LPN  May 03, 8411 4:54 PM    Appended Document: Chantix not covered under Medicare Received denial letter from [employer].  Form in your IN box  Appended Document: Chantix not covered under Medicare please let pt know that medicare denied chantix this time- unfortunately she will likely have to save up and pay out of pocket when she can afford it   Appended Document: Chantix not covered under Medicare Patient notified via  voicemail on cell phone.    Appended Document: Chantix not covered under Medicare Pt called and reported that [employer] told her they would approve her chantix through 10/29/09.  Appended Document: Chantix not covered under Medicare good news - I'm glad to hear it   Appended Document: Chantix not covered under Medicare Received PA Approval for Chantix.  Approved from 04/11/2009 through 10/29/2009.  Pharmacy notified.

## 2010-03-28 NOTE — Miscellaneous (Signed)
Summary: [facility] PV/need for Propofol?  Clinical Lists Changes   Pt is taking mltiple medications for depression and anxiety.  Screening colonoscopy for 12/13/2009 cancelled.  Pt will make new patient appointment for early December to talke with Dr. Russella Dar about sedation.

## 2010-03-28 NOTE — Assessment & Plan Note (Signed)
Summary: FEVER & CONGESTION / [facility]   Vital Signs:  Patient profile:   60 year old female Height:      67 inches Weight:      179 pounds BMI:     28.14 Temp:     99.4 degrees F oral Pulse rate:   92 / minute Pulse rhythm:   regular Resp:     24 per minute BP sitting:   128 / 70  (left arm) Cuff size:   regular  Vitals Entered By: Lewanda Rife LPN (April 18, 2009 12:45 PM)  History of Present Illness: symptoms started monday- with some ache and chills and fever (no congestion or st yet )  tuesday- same thing  wednesday -started tamiflu (was called in ) , and started respiratory symptoms and congestion  clear to white phlegm with cough  wheezing is not too bad at this point - thankfully   thursday and friday sore throat and ahes - helped to gargle  saturday more color to her mucous -- coughing a lot more  yesterday fever over 100  today 99.4  is taking ibuprofen for that    is really hoarse  up all night - coughing up phlegm  tamiflu really keeps her awake - today is the last day   chest is really sore now     Hypertension History:      Positive major cardiovascular risk factors include female age 57 years old or older and hyperlipidemia.  Negative major cardiovascular risk factors include non-tobacco-user status.    Allergies: 1)  ! Penicillin 2)  ! Erythromycin 3)  ! Restasis (Cyclosporine) 4)  Aciphex  Past History:  Past Medical History: Last updated: 03/12/2008 hospitalized for depression with disability for conversion disorder chronic fatigue syndrome irritable bowel COPD- tob abuse pneumonia RLL- hosp 1995 migraine 3/09 amaurosis fugax hyperlipidemia opthy: Dr Dagoberto Ligas Hyperthyroidism Anxiety  Past Surgical History: Last updated: 09/28/2008 bilateral TMJ surgery Hysterectomy tonsilectomy Dexa- osteopenia femoral neck (02/2001) Thyroid nodules (12/2003) Epicondylitis- injection (12/2003) Foot surgery (12/2005) pneumonia with outpt tx  (12/09) carotid dopplers nl 3/09 echocardiogram 3/09  Family History: Last updated: 10/06/2007 Faather- bladder cancer Mother -thyroid disease, hypertension, renal failure, carotid stenosis, smoker/copd grandmother had rheumatism 2 siblings in good health  Social History: Last updated: 03/11/2009 Patient is a current smoker 1 ppd against repeated counseling. on disability since 1988 single no children  Risk Factors: Smoking Status: quit (05/20/2007) Packs/Day: 1 1/2 (03/27/2007)  Review of Systems General:  Complains of chills, fatigue, fever, loss of appetite, and malaise. Eyes:  Denies discharge and eye irritation. ENT:  Complains of nasal congestion, postnasal drainage, sinus pressure, and sore throat; denies earache. CV:  Denies palpitations. Resp:  Complains of chest pain with inspiration, cough, pleuritic, sputum productive, and wheezing; denies coughing up blood. GI:  Denies abdominal pain, change in bowel habits, nausea, and vomiting. MS:  Complains of thoracic pain; chest wall is sore . Derm:  Denies itching, lesion(s), poor wound healing, and rash.  Physical Exam  General:  fatigued appearing  Head:  normocephalic, atraumatic, and no abnormalities observed.  no sinus tenderness  Eyes:  vision grossly intact, pupils equal, pupils round, and pupils reactive to light.  mild conj injection Ears:  R ear normal and L ear normal.   Nose:  nares are congested bilat  Mouth:  pharynx pink and moist, no erythema, and no exudates.   Neck:  No deformities, masses, or tenderness noted. Lungs:  CTA with diffusely distant bs  no rales or rhonchi  wheeze only on forced exp  prolonged exp phase is baseline  wet sounding cough not sob Heart:  RRR with no M Abdomen:  soft and non-tender.   Skin:  Intact without suspicious lesions or rashes Cervical Nodes:  No lymphadenopathy noted Psych:  normal affect, talkative and pleasant    Impression & Recommendations:  Problem #  1:  INFLUENZA, WITH RESPIRATORY SYMPTOMS (ICD-487.1) Assessment New  in pt with inc prod cough (copd/ smoker) already finishing tamiflu high potential for bacterial complication- will cover with zithromax thankfully - reactive airways are stable  recommend sympt care- see pt instructions   trial of tessalon for cough/ mucinex for congestion  pt advised to update me if symptoms worsen or do not improve - esp if wheezing   Orders: Prescription Created Electronically [phone])  Problem # 2:  TOBACCO USE (ICD-305.1) Assessment: Comment Only pt plans to quit with chantix - starting as soon as she feel better  has cut down a lot with this illness  Her updated medication list for this problem includes:    Chantix Starting Month Pak 0.5 Mg X 11 & 1 Mg X 42 Tabs (Varenicline tartrate) .Marland Kitchen... Take by mouth as directed    Chantix 1 Mg Tabs (Varenicline tartrate) .Marland Kitchen... Take 1 by mouth two times a day after finishing starter pack  Complete Medication List: 1)  Remeron 30 Mg Tabs (Mirtazapine) .... Take 1 tablet by mouth once a day 2)  Depakote Er 500 Mg Tb24 (Divalproex sodium) .... Take 1 tablet by mouth once a day 3)  Wellbutrin Xl 150 Mg Tb24 (Bupropion hcl) .... Take 1 tablet by mouth once a day 4)  Buspar 15 Mg Tabs (Buspirone hcl) .... One tab twice daily 5)  Spiriva Handihaler 18 Mcg Caps (Tiotropium bromide monohydrate) .... Once daily 6)  Flonase 50 Mcg/act Susp (Fluticasone propionate) .... 2 sprays in each nostril once daily 7)  Proair Hfa 108 (90 Base) Mcg/act Aers (Albuterol sulfate) .... 2 puffs 4 x daily as needed 8)  Ativan 1 Mg Tabs (Lorazepam) .... Prn 9)  Zocor 20 Mg Tabs (Simvastatin) .... Take one by mouth daily 10)  Adult Aspirin Low Strength 81 Mg Tbdp (Aspirin) .... Take 1 by mouth once daily 11)  Flexeril 10 Mg Tabs (Cyclobenzaprine hcl) .... 1/2 to 1 by mouth up to three times a day as needed back pain 12)  Cymbalta 60 Mg Cpep (Duloxetine hcl) .... Take 1 by mouth once  daily 13)  Flovent Hfa 44 Mcg/act Aero (Fluticasone propionate  hfa) .Marland Kitchen.. 1puff two times a day as needed 14)  Fish Oil 1000 Mg Caps (Omega-3 fatty acids) .... Takes two daily 15)  Chantix Starting Month Pak 0.5 Mg X 11 & 1 Mg X 42 Tabs (Varenicline tartrate) .... Take by mouth as directed 16)  Chantix 1 Mg Tabs (Varenicline tartrate) .... Take 1 by mouth two times a day after finishing starter pack 17)  Ibuprofen 200 Mg Tabs (Ibuprofen) .... Otc as directed. 18)  Zithromax Z-pak 250 Mg Tabs (Azithromycin) .... Take by mouth as directed 19)  Tessalon 200 Mg Caps (Benzonatate) .Marland Kitchen.. 1 by mouth three times a day as needed cough - swallow whole  Hypertension Assessment/Plan:      The patient's hypertensive risk group is category B: At least one risk factor (excluding diabetes) with no target organ damage.  Her calculated 10 year risk of coronary heart disease is 6 %.  Today's blood pressure is 128/70.  Patient Instructions: 1)  drink lots of fluids and get rest  2)  tylenol or ibuprofen for fever 3)  alert me if wheezing worsens  4)  take zithromax to cover for bronchitis 5)  rest your voice 6)  try tessalon for cough  7)  mucinex for congestion is ok  Prescriptions: TESSALON 200 MG CAPS (BENZONATATE) 1 by mouth three times a day as needed cough - swallow whole  #30 x 0   Entered and Authorized by:   Judith Part MD   Signed by:   Judith Part MD on 04/18/2009   Method used:   Electronically to        [facility] (retail)       [address] [place], [place]  [address]       Ph: [phone]       Fax: [phone]   RxID:   [id number] ZITHROMAX Z-PAK 250 MG TABS (AZITHROMYCIN) take by mouth as directed  #1 pack x 0   Entered and Authorized by:   Judith Part MD   Signed by:   Judith Part MD on 04/18/2009   Method used:   Electronically to        [facility] (retail)       [address] [place], [place]  [address]       Ph:  [phone]       Fax: [phone]   RxID:   [id number]   Current Allergies (reviewed today): ! PENICILLIN ! ERYTHROMYCIN ! RESTASIS (CYCLOSPORINE) ACIPHEX

## 2010-03-28 NOTE — Progress Notes (Signed)
  Phone Note Call from Patient   Caller: Patient Summary of Call: Called the pt to set up Hand appt. She went to see Dr Lajoyce Corners for her neck and back and while she was there she also menthioned her hands. He was able to see films in the pacs system and said that it was nothing to see another Dr about. It was degenerative due to age and that the blood was pooling in her wrists due to the accident. She will call us if they continue to hurt and if she wants the referral to the Hand Specialist but will probably wait and see how they feel in time. Initial call taken by: Carlton Adam,  August 09, 2009 2:52 PM  Follow-up for Phone Call        that sounds like good news  please send for his note when availible  I'm fine with call back if hands do not improve - can cancel ref for now  Follow-up by: Judith Part MD,  August 09, 2009 5:20 PM  Additional Follow-up for Phone Call Additional follow up Details #1::        Called Dr Burna Sis office and requested his note be faxed to Dr Milinda Antis. Additional Follow-up by: Carlton Adam,  August 10, 2009 8:12 AM
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
## 2010-03-28 NOTE — Letter (Signed)
Summary: [facility]   Imported By: Lester Elkins 08/13/2009 11:28:14  _____________________________________________________________________  External Attachment:    Type:   Image     Comment:   External Document

## 2010-03-28 NOTE — Progress Notes (Signed)
Summary: weak, achy  Phone Note Call from Patient Call back at Home Phone [phone]   Caller: Patient Call For: Judith Part MD Summary of Call: Pt states she has felt weak, shaky, cold and achy since monday.  Fever of 101.2 last night.  Taking advil.  Advised pt there is not much to do- take tylenol or advil, get plenty of fluids and rest.  Call back if not better or if she gets worse or wants to be seen. Initial call taken by: Lowella Petties CMA,  April 13, 2009 4:18 PM  Follow-up for Phone Call        I am worried about flu in her in light of copd  worried enough to empirically start her on tamiflu px written on EMR for call in  update if worse or no improvement Follow-up by: Judith Part MD,  April 13, 2009 5:41 PM  Additional Follow-up for Phone Call Additional follow up Details #1::        Patient notified as instructed by telephone. Medication phoned to [facility] pharmacy as instructed. Lewanda Rife LPN  April 13, 2009 5:51 PM     New/Updated Medications: TAMIFLU 75 MG CAPS (OSELTAMIVIR PHOSPHATE) 1 by mouth two times a day for 5 days Prescriptions: TAMIFLU 75 MG CAPS (OSELTAMIVIR PHOSPHATE) 1 by mouth two times a day for 5 days  #10 x 0   Entered and Authorized by:   Judith Part MD   Signed by:   Lewanda Rife LPN on 09/81/1914   Method used:   Telephoned to ...       [facility] (retail)       [address] [place], [place]  [address]       Ph: [phone]       Fax: [phone]   RxID:   [id number]

## 2010-03-28 NOTE — Progress Notes (Signed)
Summary: pt requests chantix  Phone Note Call from Patient Call back at Home Phone [phone]   Caller: Patient Summary of Call: Pt is asking for a script for chantix to be called to [facility] in [place], phone [phone].  This will probably be denied by her insurance and will need a prior auth, but we have to call it in first in order to get the form from the pharmacy. Initial call taken by: Lowella Petties CMA,  April 14, 2009 3:24 PM  Follow-up for Phone Call        px written on EMR for call in  Follow-up by: Judith Part MD,  April 15, 2009 8:53 AM  Additional Follow-up for Phone Call Additional follow up Details #1::        sent to pharmacy Additional Follow-up by: Benny Lennert CMA Duncan Dull),  April 15, 2009 11:16 AM    New/Updated Medications: CHANTIX STARTING MONTH PAK 0.5 MG X 11 & 1 MG X 42 TABS (VARENICLINE TARTRATE) take by mouth as directed CHANTIX 1 MG TABS (VARENICLINE TARTRATE) take 1 by mouth two times a day after finishing starter pack Prescriptions: CHANTIX 1 MG TABS (VARENICLINE TARTRATE) take 1 by mouth two times a day after finishing starter pack  #60 x 4   Entered by:   Benny Lennert CMA (AAMA)   Authorized by:   Judith Part MD   Signed by:   Benny Lennert CMA (AAMA) on 04/15/2009   Method used:   Electronically to        [facility] (retail)       [address] [place], [place]  [address]       Ph: [phone]       Fax: [phone]   RxID:   [id number] CHANTIX STARTING MONTH PAK 0.5 MG X 11 & 1 MG X 42 TABS (VARENICLINE TARTRATE) take by mouth as directed  #1 pk x 0   Entered by:   Benny Lennert CMA (AAMA)   Authorized by:   Judith Part MD   Signed by:   Benny Lennert CMA (AAMA) on 04/15/2009   Method used:   Electronically to        [facility] (retail)       [address] [place], [place]  [address]       Ph: [phone]       Fax: [phone]   RxID:   [id number]

## 2010-03-28 NOTE — Letter (Signed)
Summary: Results Follow up Letter  [facility]  [address] [place], [place] [address]   Phone: [phone]  Fax: [phone]    05/27/2009 MRN: [id number]  ELYSHA DAW [address] [place], [place]  [address]  Dear Ms. Effinger,  The following are the results of your recent test(s):  Test         Result    Pap Smear:        Normal _____  Not Normal _____ Comments: ______________________________________________________ Cholesterol: LDL(Bad cholesterol):         Your goal is less than:         HDL (Good cholesterol):       Your goal is more than: Comments:  ______________________________________________________ Mammogram:        Normal __X___  Not Normal _____ Comments: next mammogram due in 1 year  ___________________________________________________________________ Hemoccult:        Normal _____  Not normal _______ Comments:    _____________________________________________________________________ Other Tests:    We routinely do not discuss normal results over the telephone.  If you desire a copy of the results, or you have any questions about this information we can discuss them at your next office visit.   Sincerely,     Roxy Manns, MD
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
## 2010-03-28 NOTE — Progress Notes (Signed)
Summary: call a nurse  Phone Note Call from Patient   Caller: Patient Call For: Judith Part MD Summary of Call: Triage Record Num: [id number] Operator: Caswell Corwin Patient Name: Lori Orozco Call Date & Time: 04/17/2009 7:47:46PM Patient Phone: PCP: Patient Gender: Female PCP Fax : Patient DOB: [birth year] Practice Name: [facility] Reason for Call: Karen/ roomate calling that she is Tamiflu since2/16/11. Spoke with pt. Last temp was 101.4 @ 1800. has ahd a temp since Monday 04/11/09/ Lowest it's gotten is 100.0. has coughed up some phlegm. Traiged URI and sx and ongoing. Inst needs to be sen in 24 hrs. Call office 04/18/09 for aPPT. Home care and call back parameters given. Protocol(s) Used: Upper Respiratory Infections / Colds Recommended Outcome per Protocol: See Provider within 24 hours Reason for Outcome: Symptoms worsen after 7 days or symptoms do not improve after 14 days of home care Care Advice:  ~ Use a cool mist humidifier to moisten air. Be sure to clean according to manufacturer's instructions. Go to the ED if stiff neck (unable to touch chin to chest), generalized headache, difficulty opening mouth, unable to swallow liquids or signs of dehydration.  ~  ~ Consider use of a saline nasal spray per package directions to help relieve nasal congestion. Drink 6-10 eight ounce glasses (1.2-2.0 liters) of fluids per day unless previously instructed to restrict fluid intake for other medical reasons. Limit fluids that contain caffeine, sugar or alcohol.  ~ Consider acetaminophen as directed on label or by pharmacist/provider for pain or fever. PRECAUTIONS: - Use only If there is no history of liver disease, alcoholism, or intake of three or more alcohol drinks per day. - If approved by provider when breastfeeding. - Do not exceed recommended dose or frequency.  ~  ~ Warm fluids may help, or try a mixture of honey and lemon juice in warm tea. Analgesic  Advice: Consider aspirin, ibuprofen, naproxen or ketoprofen for pain or fever as directed on label or by pharmacist/provider. PRECAUTION: - If over 24 years of age, should not take long Initial call taken by: Melody Comas,  April 18, 2009 10:04 AM

## 2010-03-28 NOTE — Medication Information (Signed)
Summary: [employer] Prior Auth Denial for Chantix Tablet  [employer] Prior Auth Denial for Chantix Tablet   Imported By: Beau Fanny 05/03/2009 14:14:36  _____________________________________________________________________  External Attachment:    Type:   Image     Comment:   External Document

## 2010-03-28 NOTE — Letter (Signed)
Summary: Pre Visit Letter Revised  [facility]  [address] [place], [place] [address]   Phone: [phone]  Fax: [phone]    10/11/2009 MRN: [id number]  Lori Orozco [address] [name], [place]  [address]              Procedure Date:  Tues Oct 18.  Welcome to the Gastroenterology Division at [facility].    You are scheduled to see a nurse for your pre-procedure visit on 11-29-09 at 2pm on the 3rd floor at [facility], [address]  We ask that you try to arrive at our office 15 minutes prior to your appointment time to allow for check-in.  Please take a minute to review the attached form.  If you answer "Yes" to one or more of the questions on the first page, we ask that you call the person listed at your earliest opportunity.  If you answer "No" to all of the questions, please complete the rest of the form and bring it to your appointment.    Your nurse visit will consist of discussing your medical and surgical history, your immediate family medical history, and your medications.    If you are unable to list all of your medications on the form, please bring the medication bottles to your appointment and we will list them.  We will need to be aware of both prescribed and over the counter drugs.  We will need to know exact dosage information as well.    Please be prepared to read and sign documents such as consent forms, a financial agreement, and acknowledgement forms.  If necessary, and with your consent, a friend or relative is welcome to sit-in on the nurse visit with you.  Please bring your insurance card so that we may make a copy of it.  If your insurance requires a referral to see a specialist, please bring your referral form from your primary care physician.  No co-pay is required for this nurse visit.     If you cannot keep your appointment, please call [phone] to cancel or reschedule prior to your appointment date.  This  allows [place] the opportunity to schedule an appointment for another patient in need of care.   Thank you for choosing  Gastroenterology for your medical needs.  We appreciate the opportunity to care for you.  Please visit [place] at our website  to learn more about our practice.                     Sincerely,               The Gastroenterology Division
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
## 2010-04-04 ENCOUNTER — Ambulatory Visit (INDEPENDENT_AMBULATORY_CARE_PROVIDER_SITE_OTHER): Payer: Medicare Other | Admitting: Gastroenterology

## 2010-04-04 ENCOUNTER — Telehealth: Payer: Self-pay | Admitting: Gastroenterology

## 2010-04-04 ENCOUNTER — Encounter: Payer: Self-pay | Admitting: Gastroenterology

## 2010-04-04 DIAGNOSIS — Z1211 Encounter for screening for malignant neoplasm of colon: Secondary | ICD-10-CM

## 2010-04-04 DIAGNOSIS — Z01810 Encounter for preprocedural cardiovascular examination: Secondary | ICD-10-CM

## 2010-04-07 ENCOUNTER — Encounter: Payer: Self-pay | Admitting: Family Medicine

## 2010-04-07 ENCOUNTER — Ambulatory Visit (INDEPENDENT_AMBULATORY_CARE_PROVIDER_SITE_OTHER): Payer: Medicare Other | Admitting: Family Medicine

## 2010-04-07 DIAGNOSIS — E7800 Pure hypercholesterolemia, unspecified: Secondary | ICD-10-CM

## 2010-04-07 DIAGNOSIS — J4489 Other specified chronic obstructive pulmonary disease: Secondary | ICD-10-CM

## 2010-04-07 DIAGNOSIS — R5382 Chronic fatigue, unspecified: Secondary | ICD-10-CM

## 2010-04-07 DIAGNOSIS — E0590 Thyrotoxicosis, unspecified without thyrotoxic crisis or storm: Secondary | ICD-10-CM

## 2010-04-07 DIAGNOSIS — J449 Chronic obstructive pulmonary disease, unspecified: Secondary | ICD-10-CM

## 2010-04-07 DIAGNOSIS — F17200 Nicotine dependence, unspecified, uncomplicated: Secondary | ICD-10-CM

## 2010-04-07 DIAGNOSIS — G9332 Myalgic encephalomyelitis/chronic fatigue syndrome: Secondary | ICD-10-CM

## 2010-04-12 ENCOUNTER — Telehealth: Payer: Self-pay | Admitting: Gastroenterology

## 2010-04-12 LAB — CONVERTED CEMR LAB
AST: 18 units/L (ref 0–37)
Albumin: 4.6 g/dL (ref 3.5–5.2)
BUN: 16 mg/dL (ref 6–23)
Calcium: 10.3 mg/dL (ref 8.4–10.5)
Chloride: 102 meq/L (ref 96–112)
Glucose, Bld: 81 mg/dL (ref 70–99)
HDL: 56 mg/dL (ref >39)
Potassium: 4.7 meq/L (ref 3.5–5.3)
TSH: 1.623 microintl units/mL (ref 0.350–4.500)

## 2010-04-13 NOTE — Assessment & Plan Note (Signed)
Summary: FILL OUT DIABILITY FORMS/CLE  MEDICARE   Vital Signs:  Patient profile:   60 year old female Height:      67 inches Weight:      185.25 pounds BMI:     29.12 Temp:     98.5 degrees F oral Pulse rate:   88 / minute Pulse rhythm:   regular BP sitting:   150 / 80  (left arm) Cuff size:   regular  Vitals Entered By: Lewanda Rife LPN (April 07, 2010 3:34 PM)  Serial Vital Signs/Assessments:  Time      Position  BP       Pulse  Resp  Temp     By                     138/80                         Judith Part MD  CC: Disablity forms Comments Pt Bp is up but pt has been rushing.   History of Present Illness: is here to fill out her [employer] forms  disability for chronic fatigue syndrome and copd   is really stressed- whole body is in turmoil  hard to take care of herself with full time care of her sick mother  she continues care with psychologist and psychiatrist   bp high - but better on 2nd check   still extremely fatigued- cannot sit or stand for any length of time  pain in joints and between them in muscles cannot lift over 10 lb which makes it hard to care for her mother  sob on moderate exertion- sees pulmonary  still smoking lightly at this time  Allergies: 1)  ! Penicillin 2)  ! Erythromycin 3)  ! Restasis (Cyclosporine) 4)  Aciphex  Past History:  Past Medical History: Last updated: 03/12/2008 hospitalized for depression with disability for conversion disorder chronic fatigue syndrome irritable bowel COPD- tob abuse pneumonia RLL- hosp 1995 migraine 3/09 amaurosis fugax hyperlipidemia opthy: Dr Dagoberto Ligas Hyperthyroidism Anxiety  Past Surgical History: Last updated: 09/28/2008 bilateral TMJ surgery Hysterectomy tonsilectomy Dexa- osteopenia femoral neck (02/2001) Thyroid nodules (12/2003) Epicondylitis- injection (12/2003) Foot surgery (12/2005) pneumonia with outpt tx (12/09) carotid dopplers nl 3/09 echocardiogram  3/09  Family History: Last updated: 04/04/2010 Faather- bladder cancer Mother -thyroid disease, hypertension, renal failure, carotid stenosis, smoker/copd grandmother had rheumatism 2 siblings in good health Family History of Colon Cancer:Great Uncle  Social History: Last updated: 04/04/2010 Patient is a current smoker 1 ppd against repeated counseling. on disability since 1988 single no children Alcohol Use - no Illicit Drug Use - no  Risk Factors: Smoking Status: current (03/07/2010) Packs/Day: 0.5 (03/07/2010)  Review of Systems General:  Complains of fatigue; denies chills, fever, loss of appetite, and malaise. Eyes:  Denies blurring and eye irritation. CV:  Denies chest pain or discomfort, lightheadness, palpitations, and shortness of breath with exertion. Resp:  Complains of cough, shortness of breath, and wheezing; denies pleuritic and sputum productive. GI:  Denies abdominal pain, change in bowel habits, indigestion, and loss of appetite. GU:  Denies dysuria and urinary frequency. MS:  Complains of joint pain, muscle, muscle weakness, and stiffness; denies joint redness, joint swelling, and cramps. Derm:  Denies itching, lesion(s), poor wound healing, and rash. Neuro:  Denies numbness and tingling. Psych:  Complains of anxiety and depression. Endo:  Denies cold intolerance, excessive thirst, excessive urination, and heat intolerance. Heme:  Denies  bleeding and enlarge lymph nodes.  Physical Exam  General:  overweight but generally well appearing  Head:  normocephalic, atraumatic, and no abnormalities observed.   Eyes:  vision grossly intact, pupils equal, pupils round, and pupils reactive to light.  no conjunctival pallor, injection or icterus  Ears:  R ear normal and L ear normal.   Nose:  no nasal discharge.   Mouth:  pharynx pink and moist.   Neck:  nares are injected and congested bilaterally  Chest Wall:  No deformities, masses, or tenderness noted. no  bruising or swelling noted  Lungs:  CTA with distant bs no wheeze or rales no sob  overall improved  Heart:  RRR with no M Abdomen:  Bowel sounds positive,abdomen soft and non-tender without masses, organomegaly or hernias noted. no renal bruits  Msk:  diffuse myofascial tenderness in arms and legs some trigger points in back  some whole spinal tenderness nl rom joints  Pulses:  R and L carotid,radial,femoral,dorsalis pedis and posterior tibial pulses are full and equal bilaterally Extremities:  nl rom all ext  Neurologic:  sensation intact to light touch, gait normal, and DTRs symmetrical and normal.  no tremor  Skin:  Intact without suspicious lesions or rashes Cervical Nodes:  No lymphadenopathy noted Inguinal Nodes:  No significant adenopathy Psych:  normal affect, talkative and pleasant    Impression & Recommendations:  Problem # 1:  SYMPTOM, SYNDROME, CHRONIC FATIGUE (ICD-780.71) Assessment Unchanged still disabled from this and copd  forms for [employer] filled out in detail - taking 25 minutes   Problem # 2:  TOBACCO USER (ICD-305.1) Assessment: Unchanged doing better but still smoking  reviewed plan to quit again  discussed in detail risks of smoking, and possible outcomes including COPD, vascular dz, cancer and also respiratory infections/sinus problems    Problem # 3:  HYPERTHYROIDISM (ICD-242.90) Assessment: Unchanged tsh today no clinical changes  Orders: T-Comprehensive Metabolic Panel [id number]) T-Lipid Profile (09811-91478) T-TSH ([id number]) Venipuncture (57846) Specimen Handling (96295)  Problem # 4:  COPD (ICD-496) Assessment: Unchanged this is fairly stable on mt meds will continue f/u with pulm disability is not changed  no change in meds  again disc imp of quitting smoking  Her updated medication list for this problem includes:    Spiriva Handihaler 18 Mcg Caps (Tiotropium bromide monohydrate) ..... Once daily    Proair Hfa 108 (90 Base)  Mcg/act Aers (Albuterol sulfate) .Marland Kitchen... 2 puffs 4 x daily as needed    Flovent Hfa 44 Mcg/act Aero (Fluticasone propionate  hfa) .Marland Kitchen... 1puff two times a day as needed  Problem # 5:  HYPERCHOLESTEROLEMIA, PURE (ICD-272.0) Assessment: Unchanged has been fairly controlled with zocor and diet  continue current lab today Her updated medication list for this problem includes:    Zocor 20 Mg Tabs (Simvastatin) .Marland Kitchen... Take one by mouth daily  Orders: T-Comprehensive Metabolic Panel [id number]) T-Lipid Profile [id number]) T-TSH [id number]) Venipuncture (38756) Specimen Handling (43329)  Complete Medication List: 1)  Remeron 45 Mg Tabs (Mirtazapine) .... One tablet by mouth once daily 2)  Depakote Er 500 Mg Tb24 (Divalproex sodium) .... Take 1 tablet by mouth once a day 3)  Wellbutrin Xl 150 Mg Tb24 (Bupropion hcl) .... Take 1 tablet by mouth once a day 4)  Buspar 15 Mg Tabs (Buspirone hcl) .... One and 1/3 tab twice daily 5)  Spiriva Handihaler 18 Mcg Caps (Tiotropium bromide monohydrate) .... Once daily 6)  Flonase 50 Mcg/act Susp (Fluticasone propionate) .... 2 sprays in each nostril once  daily as needed 7)  Proair Hfa 108 (90 Base) Mcg/act Aers (Albuterol sulfate) .... 2 puffs 4 x daily as needed 8)  Ativan 1 Mg Tabs (Lorazepam) .... Prn 9)  Zocor 20 Mg Tabs (Simvastatin) .... Take one by mouth daily 10)  Adult Aspirin Low Strength 81 Mg Tbdp (Aspirin) .... Take 1 by mouth once daily 11)  Flexeril 10 Mg Tabs (Cyclobenzaprine hcl) .... 1/2 to 1 by mouth up to three times a day as needed back pain 12)  Flovent Hfa 44 Mcg/act Aero (Fluticasone propionate  hfa) .Marland Kitchen.. 1puff two times a day as needed 13)  Ibuprofen 200 Mg Tabs (Ibuprofen) .... Otc as directed. 14)  Zoloft 100 Mg Tabs (Sertraline hcl) .... Take 1 by mouth once daily 15)  Premarin 0.625 Mg Tabs (Estrogens conjugated) .... Take 1 by mouth once daily 16)  Moviprep 100 Gm Solr (Peg-kcl-nacl-nasulf-na asc-c) .... As per prep  instructions.  Patient Instructions: 1)  labs today 2)  forms filled out to scan  3)  no change in medicines  4)  keep working on the smoking cessation    Orders Added: 1)  T-Comprehensive Metabolic Panel [[id number]] 2)  T-Lipid Profile [[id number]] 3)  T-TSH [[id number]] 4)  Venipuncture [13086] 5)  Specimen Handling [99000] 6)  Est. Patient Level IV [57846]    Current Allergies (reviewed today): ! PENICILLIN ! ERYTHROMYCIN ! RESTASIS (CYCLOSPORINE) ACIPHEX

## 2010-04-13 NOTE — Assessment & Plan Note (Signed)
Summary: NEED TO SET UP COLON..LSW - SCHD WITH PT//MEDICARE//ONLY ASPR...   History of Present Illness Visit Type: Initial Consult Primary GI MD: Elie Goody MD [facility] Primary Provider: Roxy Manns, MD Requesting Provider: Roxy Manns, MD Chief Complaint: Consult Colonoscopy. Pt denies any GI complaints  History of Present Illness:   Is a 60 year old female today to discuss colorectal cancer screening with colonoscopy. She has no ongoing colorectal complaints. A great uncle had colon cancer, but no other family members with a history of colon cancer or colon polyps. She has several underlying medical problems and is maintained on Wellbutrin, BuSpar and Ativan. She has COPD, chronic fatigue syndrome, hyperlipidemia, and anxiety.    GI Review of Systems      Denies abdominal pain, acid reflux, belching, bloating, chest pain, dysphagia with liquids, dysphagia with solids, heartburn, loss of appetite, nausea, vomiting, vomiting blood, weight loss, and  weight gain.        Denies anal fissure, black tarry stools, change in bowel habit, constipation, diarrhea, diverticulosis, fecal incontinence, heme positive stool, hemorrhoids, irritable bowel syndrome, jaundice, light color stool, liver problems, rectal bleeding, and  rectal pain.   Current Medications (verified): 1)  Remeron 45 Mg Tabs (Mirtazapine) .... One Tablet By Mouth Once Daily 2)  Depakote Er 500 Mg  Tb24 (Divalproex Sodium) .... Take 1 Tablet By Mouth Once A Day 3)  Wellbutrin Xl 150 Mg  Tb24 (Bupropion Hcl) .... Take 1 Tablet By Mouth Once A Day 4)  Buspar 15 Mg  Tabs (Buspirone Hcl) .... One and 1/3 Tab Twice Daily 5)  Spiriva Handihaler 18 Mcg  Caps (Tiotropium Bromide Monohydrate) .... Once Daily 6)  Flonase 50 Mcg/act  Susp (Fluticasone Propionate) .... 2 Sprays in Each Nostril Once Daily As Needed 7)  Proair Hfa 108 (90 Base) Mcg/act  Aers (Albuterol Sulfate) .... 2 Puffs 4 X Daily As Needed 8)  Ativan 1 Mg  Tabs  (Lorazepam) .... Prn 9)  Zocor 20 Mg  Tabs (Simvastatin) .... Take One By Mouth Daily 10)  Adult Aspirin Low Strength 81 Mg  Tbdp (Aspirin) .... Take 1 By Mouth Once Daily 11)  Flexeril 10 Mg Tabs (Cyclobenzaprine Hcl) .... 1/2 To 1 By Mouth Up To Three Times A Day As Needed Back Pain 12)  Flovent Hfa 44 Mcg/act Aero (Fluticasone Propionate  Hfa) .Marland Kitchen.. 1puff Two Times A Day As Needed 13)  Ibuprofen 200 Mg Tabs (Ibuprofen) .... Otc As Directed. 14)  Zoloft 100 Mg Tabs (Sertraline Hcl) .... Take 1 By Mouth Once Daily 15)  Premarin 0.625 Mg Tabs (Estrogens Conjugated) .... Take 1 By Mouth Once Daily  Allergies (verified): 1)  ! Penicillin 2)  ! Erythromycin 3)  ! Restasis (Cyclosporine) 4)  Aciphex  Past History:  Past Medical History: Reviewed history from 03/12/2008 and no changes required. hospitalized for depression with disability for conversion disorder chronic fatigue syndrome irritable bowel COPD- tob abuse pneumonia RLL- hosp 1995 migraine 3/09 amaurosis fugax hyperlipidemia opthy: Dr Dagoberto Ligas Hyperthyroidism Anxiety  Past Surgical History: Reviewed history from 09/28/2008 and no changes required. bilateral TMJ surgery Hysterectomy tonsilectomy Dexa- osteopenia femoral neck (02/2001) Thyroid nodules (12/2003) Epicondylitis- injection (12/2003) Foot surgery (12/2005) pneumonia with outpt tx (12/09) carotid dopplers nl 3/09 echocardiogram 3/09  Family History: Reviewed history from 10/06/2007 and no changes required. Faather- bladder cancer Mother -thyroid disease, hypertension, renal failure, carotid stenosis, smoker/copd grandmother had rheumatism 2 siblings in good health Family History of Colon Cancer:Great Uncle  Social History: Patient is a  current smoker 1 ppd against repeated counseling. on disability since 1988 single no children Alcohol Use - no Illicit Drug Use - no Drug Use:  no  Review of Systems       The patient complains of  anxiety-new, back pain, depression-new, fatigue, headaches-new, muscle pains/cramps, night sweats, shortness of breath, thirst - excessive, and urination - excessive.         The pertinent positives and negatives are noted as above and in the HPI. All other ROS were reviewed and were negative.  Vital Signs:  Patient profile:   60 year old female Height:      67 inches Weight:      186.13 pounds BMI:     29.26 BSA:     1.96 Pulse rate:   92 / minute Pulse rhythm:   regular BP sitting:   132 / 64  (left arm) Cuff size:   regular  Vitals Entered By: Christie Nottingham CMA Duncan Dull) (April 04, 2010 11:08 AM)  Physical Exam  General:  Well developed, well nourished, no acute distress. Head:  Normocephalic and atraumatic. Eyes:  PERRLA, no icterus. Ears:  Normal auditory acuity. Mouth:  No deformity or lesions, dentition normal. Neck:  Supple; no masses or thyromegaly. Lungs:  Clear throughout to auscultation. Heart:  Regular rate and rhythm; no murmurs, rubs,  or bruits. Abdomen:  Soft, nontender and nondistended. No masses, hepatosplenomegaly or hernias noted. Normal bowel sounds. Rectal:  deferred until time of colonoscopy.   Msk:  Symmetrical with no gross deformities. Normal posture. Pulses:  Normal pulses noted. Extremities:  No clubbing, cyanosis, edema or deformities noted. Neurologic:  Alert and  oriented x4;  grossly normal neurologically. Cervical Nodes:  No significant cervical adenopathy. Inguinal Nodes:  No significant inguinal adenopathy. Psych:  Alert and cooperative. Normal mood and affect.  Impression & Recommendations:  Problem # 1:  SPECIAL SCREENING FOR MALIGNANT NEOPLASMS COLON (ICD-V76.51) Average risk for colorectal cancer. The risks, benefits and alternatives to colonoscopy with possible biopsy and possible polypectomy were discussed with the patient and they consent to proceed. The procedure will be scheduled electively. Given her comorbidities and  medications MAC sedation would be more effective, and safer. Orders: Colonoscopy (Colon)  Problem # 2:  COPD (ICD-496)  Problem # 3:  ANXIETY (ICD-300.00)  Problem # 4:  BIPOLAR AFFECTIVE DISORDER (ICD-296.80)  Patient Instructions: 1)  Colonoscopy brochure given.  2)  Pick up your prep from your pharmacy.  3)  Copy sent to : Roxy Manns, MD 4)  The medication list was reviewed and reconciled.  All changed / newly prescribed medications were explained.  A complete medication list was provided to the patient / caregiver.  Prescriptions: MOVIPREP 100 GM  SOLR (PEG-KCL-NACL-NASULF-NA ASC-C) As per prep instructions.  #1 x 0   Entered by:   Christie Nottingham CMA (AAMA)   Authorized by:   Meryl Dare MD [facility]   Signed by:   Christie Nottingham CMA (AAMA) on 04/04/2010   Method used:   Electronically to        [facility] (retail)       [address] [place], [place]  [address]       Ph: [phone]       Fax: [phone]   RxID:   [id number]

## 2010-04-13 NOTE — Letter (Signed)
Summary: [facility] Instructions  [facility]  [address] [place], [place] [address]   Phone: [phone]  Fax: [phone]       ASHELYN Orozco    [birth year]    MRN: [id number]        Procedure Day /Date: Friday March 30th, 2012     Arrival Time: 1:00pm     Procedure Time: 2:00pm     Location of Procedure:                    _ x_  [facility] (4th Floor)                        PREPARATION FOR COLONOSCOPY w/ propofol WITH MOVIPREP   Starting 5 days prior to your procedure 04/22/10 do not eat nuts, seeds, popcorn, corn, beans, peas,  salads, or any raw vegetables.  Do not take any fiber supplements (e.g. Metamucil, Citrucel, and Benefiber).  THE DAY BEFORE YOUR PROCEDURE         DATE: 04/26/10  DAY: Thursday  1.  Drink clear liquids the entire day-NO SOLID FOOD  2.  Do not drink anything colored red or purple.  Avoid juices with pulp.  No orange juice.  3.  Drink at least 64 oz. (8 glasses) of fluid/clear liquids during the day to prevent dehydration and help the prep work efficiently.  CLEAR LIQUIDS INCLUDE: Water Jello Ice Popsicles Tea (sugar ok, no milk/cream) Powdered fruit flavored drinks Coffee (sugar ok, no milk/cream) Gatorade Juice: apple, white grape, white cranberry  Lemonade Clear bullion, consomm, broth Carbonated beverages (any kind) Strained chicken noodle soup Hard Candy                             4.  In the morning, mix first dose of MoviPrep solution:    Empty 1 Pouch A and 1 Pouch B into the disposable container    Add lukewarm drinking water to the top line of the container. Mix to dissolve    Refrigerate (mixed solution should be used within 24 hrs)  5.  Begin drinking the prep at 5:00 p.m. The MoviPrep container is divided by 4 marks.   Every 15 minutes drink the solution down to the next mark (approximately 8 oz) until the full liter is complete.   6.  Follow completed prep with 16 oz of clear liquid  of your choice (Nothing red or purple).  Continue to drink clear liquids until bedtime.  7.  Before going to bed, mix second dose of MoviPrep solution:    Empty 1 Pouch A and 1 Pouch B into the disposable container    Add lukewarm drinking water to the top line of the container. Mix to dissolve    Refrigerate  THE DAY OF YOUR PROCEDURE      DATE: 05/26/10 DAY: Friday  Beginning at  9:00a.m. (5 hours before procedure):         1. Every 15 minutes, drink the solution down to the next mark (approx 8 oz) until the full liter is complete.  2. Follow completed prep with 16 oz. of clear liquid of your choice.    3. You may drink clear liquids until 12:00pm (2 HOURS BEFORE PROCEDURE).   MEDICATION INSTRUCTIONS  Unless otherwise instructed, you should take regular prescription medications with a small sip of water   as early as possible the morning  of your procedure.          OTHER INSTRUCTIONS  You will need a responsible adult at least 60 years of age to accompany you and drive you home.   This person must remain in the waiting room during your procedure.  Wear loose fitting clothing that is easily removed.  Leave jewelry and other valuables at home.  However, you may wish to bring a book to read or  an iPod/MP3 player to listen to music as you wait for your procedure to start.  Remove all body piercing jewelry and leave at home.  Total time from sign-in until discharge is approximately 2-3 hours.  You should go home directly after your procedure and rest.  You can resume normal activities the  day after your procedure.  The day of your procedure you should not:   Drive   Make legal decisions   Operate machinery   Drink alcohol   Return to work  You will receive specific instructions about eating, activities and medications before you leave.    The above instructions have been reviewed and explained to me by   _______________________    I fully understand  and can verbalize these instructions _____________________________ Date _________

## 2010-04-13 NOTE — Progress Notes (Signed)
Summary: medication   Phone Note Call from Patient   Caller: Pharmacist Call For: Dr. Russella Dar Reason for Call: Talk to Nurse Summary of Call: [phone] Bill the pharmacist, they needs to know if they can use a generic for Moviprep because patients insurance does not cover it Initial call taken by: [place] Johnson,  April 04, 2010 1:10 PM  Follow-up for Phone Call        Left a message for Bill to call me back regarding pts Movi prep. Follow-up by: Christie Nottingham CMA Duncan Dull),  April 04, 2010 1:53 PM  Additional Follow-up for Phone Call Additional follow up Details #1::        Bill the pharmacist states her insurance company does not cover Movi prep and he wanted to know if we can switch patient to a alternative cheaper prep. Informed pharmacist we do not prefer to switch to another prep but we do have mail in rebate coupons for the patient. Informed Bill to call us back if the patient still cannot afford the prep with the coupon and needs to be switched to another prep. Additional Follow-up by: Christie Nottingham CMA Duncan Dull),  April 04, 2010 2:57 PM

## 2010-04-18 ENCOUNTER — Other Ambulatory Visit: Payer: Self-pay | Admitting: Family Medicine

## 2010-04-18 DIAGNOSIS — Z1231 Encounter for screening mammogram for malignant neoplasm of breast: Secondary | ICD-10-CM

## 2010-04-19 NOTE — Progress Notes (Signed)
Summary: Medication   Phone Note Call from Patient Call back at Home Phone [phone]   Caller: Patient Call For: Dr. Russella Dar Reason for Call: Talk to Nurse Summary of Call: Moviprep is still too expensive with the coupon.....requesting an alternative med. Initial call taken by: Karna Christmas,  April 12, 2010 1:40 PM  Follow-up for Phone Call        Movi prep sample was left up front for pt to pick up.  Follow-up by: Christie Nottingham CMA Duncan Dull),  April 12, 2010 3:55 PM

## 2010-05-22 ENCOUNTER — Other Ambulatory Visit: Payer: Self-pay | Admitting: *Deleted

## 2010-05-22 MED ORDER — CYCLOBENZAPRINE HCL 10 MG PO TABS: 10.0000 mg | ORAL_TABLET | Freq: Three times a day (TID) | ORAL | Status: AC | PRN

## 2010-05-22 NOTE — Telephone Encounter (Signed)
Here is px for call in

## 2010-05-25 ENCOUNTER — Encounter: Payer: Self-pay | Admitting: Gastroenterology

## 2010-05-26 ENCOUNTER — Encounter: Payer: Self-pay | Admitting: Gastroenterology

## 2010-05-26 ENCOUNTER — Ambulatory Visit (AMBULATORY_SURGERY_CENTER): Payer: Medicare Other | Admitting: Gastroenterology

## 2010-05-26 ENCOUNTER — Encounter: Payer: Medicare Other | Admitting: Gastroenterology

## 2010-05-26 VITALS — BP 156/72 | HR 87 | Temp 98.0°F | Resp 16 | Ht 68.0 in | Wt 180.0 lb

## 2010-05-26 DIAGNOSIS — K5520 Angiodysplasia of colon without hemorrhage: Secondary | ICD-10-CM

## 2010-05-26 DIAGNOSIS — Z1211 Encounter for screening for malignant neoplasm of colon: Secondary | ICD-10-CM

## 2010-05-26 HISTORY — PX: COLONOSCOPY: SHX174

## 2010-05-26 NOTE — Patient Instructions (Signed)
AVS sent to patient

## 2010-05-26 NOTE — Progress Notes (Signed)
PLEASE SEE SCANNED INTRA PROCEDURE NOTE FOR PROPOFOL PER JOHN NULTY CRNA

## 2010-05-29 ENCOUNTER — Telehealth: Payer: Self-pay

## 2010-05-29 NOTE — Telephone Encounter (Signed)
Called pt's hm#, which is the same # for cell, a/m picked up.  No ID, No message left. MAW

## 2010-05-30 ENCOUNTER — Ambulatory Visit
Admission: RE | Admit: 2010-05-30 | Discharge: 2010-05-30 | Disposition: A | Discharge status: Home / Self Care | Payer: Medicare Other | Source: Ambulatory Visit | Attending: Family Medicine | Admitting: Family Medicine

## 2010-05-30 DIAGNOSIS — Z1231 Encounter for screening mammogram for malignant neoplasm of breast: Secondary | ICD-10-CM

## 2010-05-30 LAB — HM MAMMOGRAPHY: HM Mammogram: NORMAL

## 2010-05-30 NOTE — Procedures (Signed)
Summary: Colonoscopy  Patient: Lori Orozco Note: All result statuses are Final unless otherwise noted.  Tests: (1) Colonoscopy (COL)   COL Colonoscopy           DONE     [facility]     [address]     [place], [place]  [address]          COLONOSCOPY PROCEDURE REPORT          PATIENT:  [name], [name]  MR#:  [id number]     BIRTHDATE:  [birth year], 59 yrs. old  GENDER:  female     ENDOSCOPIST:  Judie Petit T. Russella Dar, MD, [facility]     Referred by:  Marne A. Milinda Antis, M.D.     PROCEDURE DATE:  05/26/2010     PROCEDURE:  Average-risk screening colonoscopy G0121     ASA CLASS:  Class II     INDICATIONS:  1) Routine Risk Screening     MEDICATIONS:   propofol (Diprivan) 200 mg IV     DESCRIPTION OF PROCEDURE:   After the risks benefits and     alternatives of the procedure were thoroughly explained, informed     consent was obtained.  Digital rectal exam was performed and     revealed no abnormalities.   The LB [id number] endoscope     was introduced through the anus and advanced to the cecum, which     was identified by both the appendix and ileocecal valve, without     limitations.  The quality of the prep was excellent, using     MoviPrep.  The instrument was then slowly withdrawn as the colon     was fully examined.          <<PROCEDUREIMAGES>>          FINDINGS:  Arteriovenous malformation was seen in the in the     cecum. They were non-bleeding. They were 6 mm in size.  A normal     appearing  ileocecal valve, and appendiceal orifice were     identified. The ascending, hepatic flexure, transverse, splenic     flexure, descending, sigmoid colon, and rectum appeared     unremarkable. Retroflexed views in the rectum revealed no     abnormalities. The time to cecum =  3.75  minutes. The scope was     then withdrawn (time =  13  min) from the patient and the     procedure completed.          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          Venita Lick. Russella Dar, MD, [facility]          n.     eSIGNED:   Venita Lick. Stark at 05/26/2010 02:38 PM          Isidor Holts, [phone]  Note: An exclamation mark (!) indicates a result that was not dispersed into the flowsheet. Document Creation Date: 05/26/2010 2:38 PM _______________________________________________________________________  (1) Order result status: Final Collection or observation date-time: 05/26/2010 14:33 Requested date-time:  Receipt date-time:  Reported date-time:  Referring Physician:   Ordering Physician: Claudette Head [phone]) Specimen Source:  Source: Launa Grill  Order Number: [id number] Lab site:

## 2010-06-22 ENCOUNTER — Telehealth: Payer: Self-pay

## 2010-06-22 NOTE — Telephone Encounter (Signed)
Letter mailed to patient Re; mammogram as instructed. Health maintenance updated.

## 2010-06-30 ENCOUNTER — Encounter: Payer: Self-pay | Admitting: Family Medicine

## 2010-07-11 NOTE — Assessment & Plan Note (Signed)
NAMEANDIE, MUNGIN NO.:  [id number]   MEDICAL RECORD NO.:  [id number]          PATIENT TYPE:  POB   LOCATION:  [facility] at [place]         FACILITY:  [facility]   PHYSICIAN:  Allie Bossier, MD        DATE OF BIRTH:  12-24-1950   DATE OF SERVICE:  02/22/2010                                  CLINIC NOTE   Ms. Gassmann is a 60 year old nulliparous woman who was seen by Dr.  Penne Lash here on November 08, 2009.  She was here wanting to restart her  estrogen replacement therapy.  Dr. Penne Lash did restart her estrogen  replacement therapy because of the patient's severe menopausal symptoms.  They had a third discussion of the risks and benefits of this medical  treatment.  The patient comes back today and is extremely happy with the  improvement of her menopausal symptoms.  She does tell me that she has  had a boil in her left groin which is decreasing in size, but she  wanted me to look at it.  On exam, there appears to be a hair follicle  that has some evidence of infection; however, it does not need to be  incised and drained.  I have recommended warm compresses, told her that  should she not continue improving over the next week that I would call  her in an antibiotic; however, she is on any medicines and I have  recommended we try to avoid drug-drug interactions by treating it with  warm compresses.      Allie Bossier, MD     MCD/MEDQ  D:  02/22/2010  T:  02/22/2010  Job:  [id number]

## 2010-07-11 NOTE — Assessment & Plan Note (Signed)
[facility]                             PULMONARY OFFICE NOTE   NAME:Lori Orozco, Lori Orozco                  MRN:          [id number]  DATE:09/17/2006                            DOB:          1950/05/27    PROBLEM:  1. Chronic obstructive pulmonary disease.  2. Tobacco abuse.   HISTORY:  She got Chantix but did not refill it.  Still smoking about a  pack a day.  Cost is an issue on a day to day basis and because of that  she is still using Advair and Spiriva on a p.r.n. basis.  We reviewed  her medications.   OBJECTIVE:  Weight 186 pounds.  Blood pressure 142/80, pulse rate 98,  room air saturation 96%.  Chest is quiet, do not hear rhonchi or wheeze,  work of breathing is not increased.  I do not find adenopathy, there is  no neck vein distention or peripheral edema.  Heart sounds are regular  without murmur.   Chest x-ray from February 1 of 2008 had shown no evidence of acute  cardiopulmonary disease.   IMPRESSION:  Chronic obstructive pulmonary disease  as defined on  pulmonary function tests in March with mild slowing primarily involving  small airway flows where was response to bronchodilator and some  reduction of diffusion capacity.   PLAN:  1. We strongly emphasize smoking cessation today.  I offered support      and we talked about available tools and to Chantix. I also reviewed      her medications and discussed appropriate use, suggesting that if      she was very stable she could take her Advair once daily and use      Spiriva more on a p.r.n. basis during intervals of trouble.  2. Return in 6 months, earlier p.r.n.     Clinton D. Maple Hudson, MD, Tonny Bollman, FACP  Electronically Signed    CDY/MedQ  DD: 09/17/2006  DT: 09/18/2006  Job #: [id number]   cc:   Marne A. Milinda Antis, MD

## 2010-07-11 NOTE — Procedures (Signed)
CAROTID DUPLEX EXAM   INDICATION:  Amaurosis fugax.   HISTORY:  Diabetes:  No.  Cardiac:  No.  Hypertension:  No.  Smoking:  Yes.  Previous Surgery:  CV History:  Amaurosis Fugax  Yes, Paresthesias No, Hemiparesis No                                       RIGHT             LEFT  Brachial systolic pressure:         130               120  Brachial Doppler waveforms:         Biphasic          Biphasic  Vertebral direction of flow:        Antegrade         Antegrade  DUPLEX VELOCITIES (cm/sec)  CCA peak systolic                   77                79  ECA peak systolic                   90                117  ICA peak systolic                   78                77  ICA end diastolic                   32                20  PLAQUE MORPHOLOGY:                  Heterogenous      Heterogenous  PLAQUE AMOUNT:                      Mild              Mild  PLAQUE LOCATION:                    ICA, ECA          ICA, ECA   IMPRESSION:  1. 1-39% stenosis noted in bilateral internal carotid arteries.  2. Antegrade bilateral vertebral arteries.      ___________________________________________  Quita Skye Hart Rochester, M.D.   MG/MEDQ  D:  05/08/2007  T:  05/08/2007  Job:  [id number]
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
## 2010-09-07 ENCOUNTER — Encounter: Payer: Self-pay | Admitting: Internal Medicine

## 2010-09-07 ENCOUNTER — Ambulatory Visit (INDEPENDENT_AMBULATORY_CARE_PROVIDER_SITE_OTHER): Payer: Medicare Other | Admitting: Internal Medicine

## 2010-09-07 VITALS — BP 140/84 | HR 97 | Ht 68.0 in | Wt 184.4 lb

## 2010-09-07 DIAGNOSIS — Z23 Encounter for immunization: Secondary | ICD-10-CM

## 2010-09-07 DIAGNOSIS — J449 Chronic obstructive pulmonary disease, unspecified: Secondary | ICD-10-CM

## 2010-09-07 DIAGNOSIS — F17200 Nicotine dependence, unspecified, uncomplicated: Secondary | ICD-10-CM

## 2010-09-07 NOTE — Assessment & Plan Note (Addendum)
I will contnue working with her on this. We are giving print information on the [facility] program.

## 2010-09-07 NOTE — Assessment & Plan Note (Addendum)
She is between infections, and weather is gentler today so she feels well, but gradually more limited as predicted. Meds are appropriate for now. Recent pneumonia resolved. We discussed and will give a pneumovax today. Ongoing emphasis on smoking cessation.

## 2010-09-07 NOTE — Progress Notes (Signed)
  Subjective:    Patient ID: Lori Orozco, female    DOB: Mar 05, 1950, 60 y.o.   MRN: [id number]  HPI 09/07/10- 60 yoF smoker  followed for COPD and tobacco use.  Last here March 07, 2010 Says she was treated for pneumonia in June treated by an UC in [place]. F/U cxr showed clearing after Avelox. Has had pneumovax.  Still smoking 1 PPD. We discussed smoking cessation program. Discussed meds.  Today feels fine. Aware of some DOE if she hurries.  Review of Systems Constitutional:   No weight loss, night sweats,  Fevers, chills, fatigue, lassitude. HEENT:   No headaches,  Difficulty swallowing,  Tooth/dental problems,  Sore throat,                No sneezing, itching, ear ache, nasal congestion, post nasal drip,   CV:  No chest pain,  Orthopnea, PND, swelling in lower extremities, anasarca, dizziness, palpitations  GI  No heartburn, indigestion, abdominal pain, nausea, vomiting, diarrhea, change in bowel habits, loss of appetite  Resp:.  No excess mucus, no productive cough,  No non-productive cough,  No coughing up of blood.  No change in color of mucus.  No wheezing.    Skin: no rash or lesions.  GU: no dysuria, change in color of urine, no urgency or frequency.  No flank pain.  MS:  No joint pain or swelling.  No decreased range of motion.  No back pain.  Psych:  No change in mood or affect. No depression or anxiety.  No memory loss.      Objective:   Physical Exam General- Alert, Oriented, Affect-appropriate, Distress- none acute  Skin- rash-none, lesions- none, excoriation- none  Lymphadenopathy- none  Head- atraumatic  Eyes- Gross vision intact, PERRLA, conjunctivae clear secretions  Ears- Hearing, canals, Tm- normal  Nose- Clear, Septal dev, mucus, polyps, erosion, perforation   Throat- Mallampati II , mucosa clear , drainage- none, tonsils- atrophic  Neck- flexible , trachea midline, no stridor , thyroid nl, carotid no bruit  Chest - symmetrical  excursion , unlabored     Heart/CV- RRR , no murmur , no gallop  , no rub, nl s1 s2                     - JVD- none , edema- none, stasis changes- none, varices- none     Lung- clear to P&A, diminished, wheeze- trace           cough- none , dullness-none, rub- none     Chest wall-   Abd- tender-no, distended-no, bowel sounds-present, HSM- no  Br/ Gen/ Rectal- Not done, not indicated  Extrem- cyanosis- none, clubbing, none, atrophy- none, strength- nl  Neuro- grossly intact to observation         Assessment & Plan:

## 2010-09-07 NOTE — Patient Instructions (Signed)
Pneumovax  Print information sheet on [facility] smoking cessation program-- Please keep trying to stop smoking.

## 2010-10-27 ENCOUNTER — Telehealth: Payer: Self-pay | Admitting: *Deleted

## 2010-10-27 NOTE — Telephone Encounter (Signed)
Lori Orozco at [facility] called to request medical records.  Patient is having surgery on Tuesday and they would like the records today if possible.  Her faxed number is [phone].

## 2010-10-27 NOTE — Telephone Encounter (Signed)
Send whatever they want -thanks

## 2010-10-27 NOTE — Telephone Encounter (Signed)
Pt's last office note and labs faxed to [phone].

## 2011-01-01 ENCOUNTER — Other Ambulatory Visit: Payer: Self-pay | Admitting: *Deleted

## 2011-01-01 MED ORDER — SIMVASTATIN 20 MG PO TABS: 20.0000 mg | ORAL_TABLET | Freq: Every day | ORAL | Status: DC

## 2011-02-12 ENCOUNTER — Ambulatory Visit (INDEPENDENT_AMBULATORY_CARE_PROVIDER_SITE_OTHER)
Admission: RE | Admit: 2011-02-12 | Discharge: 2011-02-12 | Disposition: A | Discharge status: Home / Self Care | Payer: Medicare Other | Source: Ambulatory Visit | Attending: Family Medicine | Admitting: Family Medicine

## 2011-02-12 ENCOUNTER — Encounter: Payer: Self-pay | Admitting: Family Medicine

## 2011-02-12 ENCOUNTER — Ambulatory Visit (INDEPENDENT_AMBULATORY_CARE_PROVIDER_SITE_OTHER): Payer: Medicare Other | Admitting: Family Medicine

## 2011-02-12 VITALS — BP 122/64 | HR 105 | Temp 98.5°F | Resp 16 | Ht 68.0 in | Wt 181.0 lb

## 2011-02-12 DIAGNOSIS — R05 Cough: Secondary | ICD-10-CM

## 2011-02-12 DIAGNOSIS — J441 Chronic obstructive pulmonary disease with (acute) exacerbation: Secondary | ICD-10-CM

## 2011-02-12 MED ORDER — PREDNISONE 20 MG PO TABS: ORAL_TABLET | ORAL | Status: DC

## 2011-02-12 NOTE — Progress Notes (Signed)
  Patient Name: Lori Orozco Date of Birth: October 15, 1950 Age: 60 y.o. Medical Record Number: [id number] Gender: female  History of Present Illness:  Lori Orozco is a 60 y.o. very pleasant female patient with a history of COPD who presents with the following:  01/24/2011 got sick, then went to [facility]. Xrays, were worried about possible pneumonia?  Then had a + Influenza A. Was having some fever and felt really tight -- and on the following Monday went to [facility], and was given a script to prednisone. Had some IM steroids. Seemed to clear up and things started to loosen up. Everything that is breaking up and coughing up and spit. Back is so sore and hurting when coughing. Off prednisone right now.  She is still wheezing some. In general, improving, but wheezing, coughing up thick sputum.  Past Medical History, Surgical History, Social History, Family History, and Problem List have been reviewed in EHR and updated if relevant.  Review of Systems: ROS: GEN: Acute illness details above GI: Tolerating PO intake GU: maintaining adequate hydration and urination Pulm: No SOB Interactive and getting along well at home.  Otherwise, ROS is as per the HPI.   Physical Examination: Filed Vitals:   02/12/11 1219  BP: 122/64  Pulse: 105  Temp: 98.5 F (36.9 C)  TempSrc: Oral  Resp: 16  Height: 5\' 8"  (1.727 m)  Weight: 181 lb (82.101 kg)  SpO2: 95%    Body mass index is 27.52 kg/(m^2).   GEN: WDWN, NAD, Non-toxic, A & O x 3 HEENT: Atraumatic, Normocephalic. Neck supple. No masses, No LAD. Ears and Nose: No external deformity. CV: RRR, No M/G/R. No JVD. No thrill. No extra heart sounds. PULM: diffuse wheezing, occ rhonchi, no crackles. No resp distress EXTR: No c/c/e NEURO Normal gait.  PSYCH: Normally interactive. Conversant. Not depressed or anxious appearing.  Calm demeanor.    Assessment and Plan: 1. Productive cough  DG Chest 2 View, predniSONE (DELTASONE)  20 MG tablet  2. COPD exacerbation  predniSONE (DELTASONE) 20 MG tablet    Resolving flu. Ongoing COPD flare and cough as a result burst and taper of steroids.  CXR, AP and Lateral Indication: Cough, shortness or breath: Findings: No focal infiltrate, there is some increased peribronchial markings

## 2011-03-06 ENCOUNTER — Ambulatory Visit (INDEPENDENT_AMBULATORY_CARE_PROVIDER_SITE_OTHER): Payer: Medicare Other | Admitting: Internal Medicine

## 2011-03-06 ENCOUNTER — Encounter: Payer: Self-pay | Admitting: Internal Medicine

## 2011-03-06 VITALS — BP 138/84 | HR 95 | Ht 68.0 in | Wt 187.0 lb

## 2011-03-06 DIAGNOSIS — F17200 Nicotine dependence, unspecified, uncomplicated: Secondary | ICD-10-CM

## 2011-03-06 DIAGNOSIS — J449 Chronic obstructive pulmonary disease, unspecified: Secondary | ICD-10-CM

## 2011-03-06 MED ORDER — TIOTROPIUM BROMIDE MONOHYDRATE 18 MCG IN CAPS: 18.0000 ug | ORAL_CAPSULE | Freq: Every day | RESPIRATORY_TRACT | Status: DC

## 2011-03-06 NOTE — Patient Instructions (Addendum)
Refill script Spiriva  Please call as needed

## 2011-03-06 NOTE — Progress Notes (Signed)
Subjective:    Patient ID: Lori Orozco, female    DOB: September 18, 1950, 61 y.o.   MRN: [id number]  HPI 09/07/10- 27 yoF smoker  followed for COPD and tobacco use.  Last here March 07, 2010 Says she was treated for pneumonia in June treated by an UC in [place]. F/U cxr showed clearing after Avelox. Has had pneumovax.  Still smoking 1 PPD. We discussed smoking cessation program. Discussed meds.  Today feels fine. Aware of some DOE if she hurries.   03/06/11- 60 yoF smoker  followed for COPD and tobacco use. Since last here she lost her mother and is grieving. Had flu vaccine. Still smoking about a pack a day. She had finger surgery under general anesthesia at [facility] and brings preoperative spirometry done 10/27/2010 which showed FEV1 1.54, FEV1/FVC 0.63 and FEF 25-75% 0.84, indicating moderate obstructive airways disease with some restriction of forced vital capacity, possibly air trapping. Recognizes morning cough with only scant white sputum. Denies chest pain fever or blood. Short of breath with brisk exertion. Chest x-ray: 02/12/2011-stable, no active disease, old right rib fracture, healed.   ROS-see HPI Constitutional:   No-   weight loss, night sweats, fevers, chills, fatigue, lassitude. HEENT:   No-  headaches, difficulty swallowing, tooth/dental problems, sore throat,       No-  sneezing, itching, ear ache, nasal congestion, post nasal drip,  CV:  No-   chest pain, orthopnea, PND, swelling in lower extremities, anasarca, dizziness, palpitations Resp: +shortness of breath with exertion or at rest.              + productive cough,  No non-productive cough,  No- coughing up of blood.              No-   change in color of mucus.  No- wheezing.   Skin: No-   rash or lesions. GI:  No-   heartburn, indigestion, abdominal pain, nausea, vomiting, diarrhea,                 change in bowel habits, loss of appetite GU: MS:  No-   joint pain or swelling.  No- decreased range  of motion.  No- back pain. Neuro-     nothing unusual Psych:  No- change in mood or affect. No depression or anxiety.  No memory loss.      Objective:   Physical Exam General- Alert, Oriented, Affect-appropriate, Distress- none acute, looks older than stated age Skin- rash-none, lesions- none, excoriation- none Lymphadenopathy- none Head- atraumatic            Eyes- Gross vision intact, PERRLA, conjunctivae clear secretions            Ears- Hearing, canals-normal            Nose- Clear, no-Septal dev, mucus, polyps, erosion, perforation             Throat- Mallampati III-IV , mucosa clear , drainage- none, tonsils- atrophic Neck- flexible , trachea midline, no stridor , thyroid nl, carotid no bruit Chest - symmetrical excursion , unlabored           Heart/CV- RRR , no murmur , no gallop  , no rub, nl s1 s2                           - JVD- none , edema- none, stasis changes- none, varices- none  Lung- trace rhonchi RUL, cough- none , dullness-none, rub- none           Chest wall-  Abd- tender-no, distended-no, bowel sounds-present, HSM- no Br/ Gen/ Rectal- Not done, not indicated Extrem- cyanosis- none, clubbing, none, atrophy- none, strength- nl Neuro- grossly intact to observation

## 2011-03-10 NOTE — Assessment & Plan Note (Signed)
I talked with her about motivation to stop smoking and available support including electronic cigarettes.

## 2011-03-10 NOTE — Assessment & Plan Note (Signed)
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 Our emphasis is on smoking cessation.

## 2011-04-09 ENCOUNTER — Other Ambulatory Visit: Payer: Self-pay

## 2011-04-09 MED ORDER — SIMVASTATIN 20 MG PO TABS: 20.0000 mg | ORAL_TABLET | Freq: Every day | ORAL | Status: DC

## 2011-04-09 NOTE — Telephone Encounter (Signed)
[facility] request refill Simvastatin 20 mg #30 x 6. Pt saw Dr Patsy Lager COPD 02/12/11.

## 2011-04-17 ENCOUNTER — Encounter: Payer: Self-pay | Admitting: Family Medicine

## 2011-04-17 ENCOUNTER — Ambulatory Visit (INDEPENDENT_AMBULATORY_CARE_PROVIDER_SITE_OTHER): Payer: Medicare Other | Admitting: Family Medicine

## 2011-04-17 VITALS — BP 140/80 | HR 84 | Temp 98.1°F | Ht 68.0 in | Wt 188.1 lb

## 2011-04-17 DIAGNOSIS — R5382 Chronic fatigue, unspecified: Secondary | ICD-10-CM

## 2011-04-17 DIAGNOSIS — J4489 Other specified chronic obstructive pulmonary disease: Secondary | ICD-10-CM

## 2011-04-17 DIAGNOSIS — G9332 Myalgic encephalomyelitis/chronic fatigue syndrome: Secondary | ICD-10-CM

## 2011-04-17 DIAGNOSIS — F17200 Nicotine dependence, unspecified, uncomplicated: Secondary | ICD-10-CM

## 2011-04-17 DIAGNOSIS — J449 Chronic obstructive pulmonary disease, unspecified: Secondary | ICD-10-CM

## 2011-04-17 NOTE — Assessment & Plan Note (Signed)
Disc in detail risks of smoking and possible outcomes including copd, vascular/ heart disease, cancer , respiratory and sinus infections  Pt voices understanding Pt aware She has copd Is in grief for loss of mother - and thinks in the next 6 mo will be ready to quit again

## 2011-04-17 NOTE — Assessment & Plan Note (Signed)
Forms filled out for disability (yearly) for this and also copd Stable / unimproved and unable to work at all  Also now neuropathy- treated with gabapentin Will scan form into the chart Pt also continues psych care  Will f/u for annual exam in 6 mo

## 2011-04-17 NOTE — Assessment & Plan Note (Signed)
Overall stable at this time - sees pulmonary prn  Is disabled from work and meds are unchanged Pt continues to smoke and plans to quit again hopefully in the near future with counseling for grief

## 2011-04-17 NOTE — Progress Notes (Signed)
Subjective:    Patient ID: Lori Orozco, female    DOB: [birth year], 61 y.o.   MRN: [id number]  HPI Lost her mother since her last visit - going to hospice for counseling and doing ok overall Also has her sisters - has a lot to do with estate  Still sees psychologist and psychiatrist  Tries to take care of herself  Is not sleeping very well   Physically - not much change Chronic fatigue is the same - good days and bad days  More neuropathy lately -- in surgical shoe since mid jan -- thought she had a stress fracture - now not  Using insoles  On neurontin now -for ? Nerve damage  Not doing a lot of good - at 100 mg so far - has f/u on the 5th of march   Had the flu - over a weekend  Took her 4 weeks to get over it - in and out of [facility] and [facility]  Saw Dr Patsy Lager and had f/u cxr  She had a flu shot   Smoking is the same - has told herself when grief eases off will quit again   See intake form for disability Still totally disabled from work due to chronic fatigue syndrome and pain / also copd with sob  Forms filled out and rev with pt in detail today  Patient Active Problem List  Diagnoses  . HYPERTHYROIDISM  . HYPERCHOLESTEROLEMIA, PURE  . ANEMIA  . DEPRESSION, MAJOR  . BIPOLAR AFFECTIVE DISORDER  . ANXIETY  . PERSONALITY DISORDER  . PERIPHERAL NEUROPATHY, FEET  . GUILLAIN-BARRE SYNDROME  . AMAUROSIS FUGAX  . COPD  . ESOPHAGEAL SPASM  . HIATAL HERNIA  . MENOPAUSAL SYNDROME  . BACK PAIN  . HAND PAIN, BILATERAL  . CRAMP IN LIMB  . SYMPTOM, SYNDROME, CHRONIC FATIGUE  . TREMOR  . TOBACCO USER   Past Medical History  Diagnosis Date  . COPD (chronic obstructive pulmonary disease)   . Chronic fatigue syndrome   . Hyperlipidemia   . Anxiety   . Depression   . Irritable bowel syndrome   . Pneumonia   . Migraines   . Amaurosis fugax   . Asthma   . Cataract   . Emphysema of lung   . GERD (gastroesophageal reflux disease)   . Chronic kidney  disease     frequency, nephritis  . Thyroid disease     Graves   Past Surgical History  Procedure Date  . Temporomandibular joint surgery   . Abdominal hysterectomy   . Dexa-osteopenia   . Epicondylitis   . Foot surgery   . Carotid dopplar   . Doppler echocardiography   . Tonsillectomy   . Knee surgery     Left  . Trigger finger release   . Colonoscopy 05/26/2010    avms- otherwise nl , re check 10y   History  Substance Use Topics  . Smoking status: Current Everyday Smoker -- 1.0 packs/day    Types: Cigarettes  . Smokeless tobacco: Not on file  . Alcohol Use: No   Family History  Problem Relation Age of Onset  . Thyroid disease Mother   . Hypertension Mother   . Kidney disease Mother   . Cancer Father   . Colon cancer Other    Allergies  Allergen Reactions  . Codeine Nausea Only    Makes pt stay awake  . Cyclosporine     REACTION: caused eyes to severly burn  . Erythromycin  REACTION: GI  . Penicillins     REACTION: rash  . Rabeprazole Sodium     REACTION: keeps her awake   Current Outpatient Prescriptions on File Prior to Visit  Medication Sig Dispense Refill  . albuterol (PROAIR HFA) 108 (90 BASE) MCG/ACT inhaler Inhale 2 puffs into the lungs every 6 (six) hours as needed.        Marland Kitchen aspirin 81 MG chewable tablet Chew 81 mg by mouth daily.        Marland Kitchen buPROPion (WELLBUTRIN XL) 150 MG 24 hr tablet Take 150 mg by mouth daily.        . busPIRone (BUSPAR) 15 MG tablet Take 15 mg by mouth 2 (two) times daily before a meal.        . cyclobenzaprine (FLEXERIL) 10 MG tablet Take 10 mg by mouth 3 (three) times daily as needed.        . divalproex (DEPAKOTE) 500 MG 24 hr tablet Take 500 mg by mouth daily.        Marland Kitchen estrogens, conjugated, (PREMARIN) 0.625 MG tablet Take 0.625 mg by mouth daily. Take daily for 21 days then do not take for 7 days.       . fluticasone (FLONASE) 50 MCG/ACT nasal spray 2 sprays by Nasal route daily.        . fluticasone (FLOVENT HFA) 44  MCG/ACT inhaler Inhale 1 puff into the lungs 2 (two) times daily as needed.        Marland Kitchen ibuprofen (ADVIL,MOTRIN) 200 MG tablet Take 200 mg by mouth every 8 (eight) hours as needed.        Marland Kitchen LORazepam (ATIVAN) 1 MG tablet Take 1 mg by mouth every 8 (eight) hours as needed.        . mirtazapine (REMERON) 45 MG tablet Take 45 mg by mouth at bedtime.        . sertraline (ZOLOFT) 100 MG tablet Take 100 mg by mouth daily.        . simvastatin (ZOCOR) 20 MG tablet Take 1 tablet (20 mg total) by mouth at bedtime.  30 tablet  6  . tiotropium (SPIRIVA HANDIHALER) 18 MCG inhalation capsule Place 1 capsule (18 mcg total) into inhaler and inhale daily.  30 capsule  prn       Review of Systems Review of Systems  Constitutional: Negative for fever, appetite change,  and unexpected weight change. pos for severe fatigue  Eyes: Negative for pain and visual disturbance.  Respiratory: Negative for cough and pos for baseline sob    Cardiovascular: Negative for cp or palpitations    Gastrointestinal: Negative for nausea, diarrhea and constipation.  Genitourinary: Negative for urgency and frequency.  Skin: Negative for pallor or rash   MSK pos for diffuse joint and muscle pain  Neurological: Negative for weakness, light-headedness and headaches pos for numbness and burning of feet .  Hematological: Negative for adenopathy. Does not bruise/bleed easily.  Psychiatric/Behavioral: pos for dep/ anx fairly controlled also with grief rxn         Objective:   Physical Exam  Constitutional: She appears well-developed and well-nourished. No distress.       Fatigued but well appearing   HENT:  Head: Normocephalic and atraumatic.  Mouth/Throat: Oropharynx is clear and moist.  Eyes: Conjunctivae and EOM are normal. Pupils are equal, round, and reactive to light. No scleral icterus.  Neck: Normal range of motion. Neck supple. No JVD present. No thyromegaly present.  Cardiovascular: Normal rate, regular rhythm,  normal  heart sounds and intact distal pulses.  Exam reveals no gallop.   Pulmonary/Chest: Effort normal and breath sounds normal. No respiratory distress. She has no wheezes. She has no rales. She exhibits tenderness.       Diffusely distant bs   Abdominal: Soft. Bowel sounds are normal.  Musculoskeletal: Normal range of motion. She exhibits tenderness. She exhibits no edema.       Tender myofasical trigger points on back and diffuse tenderness of muscles on ext and joints  No joint deformities   Lymphadenopathy:    She has no cervical adenopathy.  Neurological: She is alert. She has normal reflexes. No cranial nerve deficit. She exhibits normal muscle tone. Coordination normal.  Skin: Skin is warm and dry. No rash noted. No erythema. No pallor.  Psychiatric: She has a normal mood and affect.          Assessment & Plan:

## 2011-04-17 NOTE — Patient Instructions (Signed)
Did your forms for disability today Schedule annual exam for 6 months with labs prior  Have the staff up front copy your form twice --you get the original and 1 copy and I keep one copy to scan into the chart Keep thinking about quitting smoking

## 2011-07-17 ENCOUNTER — Ambulatory Visit: Payer: Medicare Other | Admitting: Family Medicine

## 2011-07-17 ENCOUNTER — Encounter: Payer: Self-pay | Admitting: Family Medicine

## 2011-07-17 ENCOUNTER — Ambulatory Visit (INDEPENDENT_AMBULATORY_CARE_PROVIDER_SITE_OTHER): Payer: Medicare Other | Admitting: Family Medicine

## 2011-07-17 VITALS — BP 142/72 | HR 102 | Temp 98.2°F | Ht 68.0 in | Wt 191.8 lb

## 2011-07-17 DIAGNOSIS — F17200 Nicotine dependence, unspecified, uncomplicated: Secondary | ICD-10-CM

## 2011-07-17 DIAGNOSIS — J209 Acute bronchitis, unspecified: Secondary | ICD-10-CM | POA: Insufficient documentation

## 2011-07-17 DIAGNOSIS — J440 Chronic obstructive pulmonary disease with acute lower respiratory infection: Secondary | ICD-10-CM

## 2011-07-17 MED ORDER — FLUTICASONE PROPIONATE 50 MCG/ACT NA SUSP: 2.0000 | Freq: Every day | NASAL | Status: DC

## 2011-07-17 MED ORDER — ALBUTEROL SULFATE HFA 108 (90 BASE) MCG/ACT IN AERS: 2.0000 | INHALATION_SPRAY | RESPIRATORY_TRACT | Status: DC | PRN

## 2011-07-17 MED ORDER — AZITHROMYCIN 250 MG PO TABS: ORAL_TABLET | ORAL | Status: AC

## 2011-07-17 MED ORDER — FLUTICASONE PROPIONATE HFA 44 MCG/ACT IN AERO: 1.0000 | INHALATION_SPRAY | Freq: Two times a day (BID) | RESPIRATORY_TRACT | Status: DC | PRN

## 2011-07-17 MED ORDER — ALBUTEROL SULFATE (2.5 MG/3ML) 0.083% IN NEBU
2.5000 mg | INHALATION_SOLUTION | Freq: Once | RESPIRATORY_TRACT | Status: AC
  Administered 2011-07-17: 2.5 mg via RESPIRATORY_TRACT

## 2011-07-17 MED ORDER — PREDNISONE 10 MG PO TABS: ORAL_TABLET | ORAL | Status: DC

## 2011-07-17 NOTE — Progress Notes (Signed)
Subjective:    Patient ID: Lori Orozco, female    DOB: 1950/09/17, 61 y.o.   MRN: [id number]  HPI Sick since Thursday- at first thought allergies-runny nose  Friday- chest congestion and head congestion  Is drinking lots of fluids   Cough - prod - yellow  Last night low grade fever of 99- chills   Wheezing a fair amount   Was around nephew with a cold on Tuesday   Did have uri in April on vacation  Used her inhalers and ibuprofen   Smoking is about the same   Needs refil of flovent - almost out  Also refil on proair  Has not used inhaler yet to day and is quite wheezy  Patient Active Problem List  Diagnoses  . HYPERTHYROIDISM  . HYPERCHOLESTEROLEMIA, PURE  . ANEMIA  . DEPRESSION, MAJOR  . BIPOLAR AFFECTIVE DISORDER  . ANXIETY  . PERSONALITY DISORDER  . PERIPHERAL NEUROPATHY, FEET  . GUILLAIN-BARRE SYNDROME  . AMAUROSIS FUGAX  . COPD  . ESOPHAGEAL SPASM  . HIATAL HERNIA  . MENOPAUSAL SYNDROME  . BACK PAIN  . HAND PAIN, BILATERAL  . CRAMP IN LIMB  . SYMPTOM, SYNDROME, CHRONIC FATIGUE  . TREMOR  . TOBACCO USER  . Acute bronchitis with chronic obstructive pulmonary disease (COPD)   Past Medical History  Diagnosis Date  . COPD (chronic obstructive pulmonary disease)   . Chronic fatigue syndrome   . Hyperlipidemia   . Anxiety   . Depression   . Irritable bowel syndrome   . Pneumonia   . Migraines   . Amaurosis fugax   . Asthma   . Cataract   . Emphysema of lung   . GERD (gastroesophageal reflux disease)   . Chronic kidney disease     frequency, nephritis  . Thyroid disease     [place]   Past Surgical History  Procedure Date  . Temporomandibular joint surgery   . Abdominal hysterectomy   . Dexa-osteopenia   . Epicondylitis   . Foot surgery   . Carotid dopplar   . Doppler echocardiography   . Tonsillectomy   . Knee surgery     Left  . Trigger finger release   . Colonoscopy 05/26/2010    avms- otherwise nl , re check 10y   History    Substance Use Topics  . Smoking status: Current Everyday Smoker -- 1.0 packs/day    Types: Cigarettes  . Smokeless tobacco: Not on file  . Alcohol Use: No   Family History  Problem Relation Age of Onset  . Thyroid disease Mother   . Hypertension Mother   . Kidney disease Mother   . Cancer Father   . Colon cancer Other    Allergies  Allergen Reactions  . Codeine Nausea Only    Makes pt stay awake  . Cyclosporine     REACTION: caused eyes to severly burn  . Erythromycin     REACTION: GI  . Penicillins     REACTION: rash  . Rabeprazole Sodium     REACTION: keeps her awake   Current Outpatient Prescriptions on File Prior to Visit  Medication Sig Dispense Refill  . albuterol (PROAIR HFA) 108 (90 BASE) MCG/ACT inhaler Inhale 2 puffs into the lungs every 4 (four) hours as needed for wheezing.  1 Inhaler  11  . aspirin 81 MG chewable tablet Chew 81 mg by mouth daily.        Marland Kitchen buPROPion (WELLBUTRIN XL) 150 MG 24 hr tablet Take  150 mg by mouth daily.        . busPIRone (BUSPAR) 15 MG tablet Take 15 mg by mouth 2 (two) times daily before a meal.        . cyclobenzaprine (FLEXERIL) 10 MG tablet Take 10 mg by mouth 3 (three) times daily as needed.        . divalproex (DEPAKOTE) 500 MG 24 hr tablet Take 500 mg by mouth daily.        Marland Kitchen estrogens, conjugated, (PREMARIN) 0.625 MG tablet Take 0.625 mg by mouth daily. Take daily for 21 days then do not take for 7 days.       . fluticasone (FLONASE) 50 MCG/ACT nasal spray Place 2 sprays into the nose daily.  16 g  11  . fluticasone (FLOVENT HFA) 44 MCG/ACT inhaler Inhale 1 puff into the lungs 2 (two) times daily as needed.  1 Inhaler  11  . ibuprofen (ADVIL,MOTRIN) 200 MG tablet Take 200 mg by mouth every 8 (eight) hours as needed.        Marland Kitchen LORazepam (ATIVAN) 1 MG tablet Take 1 mg by mouth every 8 (eight) hours as needed.        . mirtazapine (REMERON) 45 MG tablet Take 45 mg by mouth at bedtime.        . sertraline (ZOLOFT) 100 MG tablet Take  100 mg by mouth daily.        . simvastatin (ZOCOR) 20 MG tablet Take 1 tablet (20 mg total) by mouth at bedtime.  30 tablet  6  . tiotropium (SPIRIVA HANDIHALER) 18 MCG inhalation capsule Place 1 capsule (18 mcg total) into inhaler and inhale daily.  30 capsule  prn         Review of Systems Review of Systems  Constitutional: Negative for fever, appetite change,  and unexpected weight change. pos for fatigue  Eyes: Negative for pain and visual disturbance.  ENT pos for nasal congestion/ st and ear fullness  Respiratory pos for prod cough/ wheeze and sob with exertion that is intermittent  Cardiovascular: Negative for cp or palpitations    Gastrointestinal: Negative for nausea, diarrhea and constipation.  Genitourinary: Negative for urgency and frequency.  Skin: Negative for pallor or rash   Neurological: Negative for weakness, light-headedness, numbness and headaches.  Hematological: Negative for adenopathy. Does not bruise/bleed easily.  Psychiatric/Behavioral: Negative for dysphoric mood. The patient is not nervous/anxious.          Objective:   Physical Exam  Constitutional: She appears well-developed and well-nourished. No distress.  HENT:  Head: Normocephalic and atraumatic.  Right Ear: External ear normal.  Left Ear: External ear normal.  Mouth/Throat: Oropharynx is clear and moist.       Nares are injected and congested  No sinus tenderness Tms dull  Post nasal drainage   Eyes: Conjunctivae and EOM are normal. Pupils are equal, round, and reactive to light. Right eye exhibits no discharge. Left eye exhibits no discharge. No scleral icterus.  Neck: Normal range of motion. Neck supple. No JVD present. No thyromegaly present.  Cardiovascular: Regular rhythm and normal heart sounds.  Exam reveals no gallop.        Rate in 90s at rest   Pulmonary/Chest: Effort normal. No respiratory distress. She has wheezes. She has no rales.       Harsh and distant bs throughout - with  exp wheezes in all areas with prolonged exp phase  After albuterol nmt- much improved air exch with less  wheezing  No rales heard occ rhonchi noted   Abdominal: Soft. Bowel sounds are normal. She exhibits no distension. There is no tenderness.  Musculoskeletal: She exhibits no edema.  Lymphadenopathy:    She has no cervical adenopathy.  Neurological: She is alert. She has normal reflexes. No cranial nerve deficit.       Slight hand tremor after NMT  Skin: Skin is warm and dry. No rash noted. No pallor.  Psychiatric: She has a normal mood and affect.          Assessment & Plan:

## 2011-07-17 NOTE — Patient Instructions (Signed)
Take medicines as directed  Stop smoking If worse- call or go to ER if severely short of breath  Follow up with me Friday

## 2011-07-19 NOTE — Assessment & Plan Note (Signed)
Improved after albuterol NMT in office Refilled inhalers- rescue and mt  Will cover with zithromax and also pred taper  Disc red flags to watch for - if worse or sob - call/ go to ER if after hours  Also disc smoking cessation F/u Friday for re check

## 2011-07-19 NOTE — Assessment & Plan Note (Signed)
Pt is back to smoking again -had quit for a while, but distressed after the loss of her mother  Disc in detail risks of smoking and possible outcomes including copd, vascular/ heart disease, cancer , respiratory and sinus infections  Pt voices understanding - she is well aware that quitting while sick is a good idea  She is not completely ready to quit yet- but will try

## 2011-07-20 ENCOUNTER — Encounter: Payer: Self-pay | Admitting: Family Medicine

## 2011-07-20 ENCOUNTER — Ambulatory Visit (INDEPENDENT_AMBULATORY_CARE_PROVIDER_SITE_OTHER): Payer: Medicare Other | Admitting: Family Medicine

## 2011-07-20 VITALS — BP 140/80 | HR 102 | Temp 97.8°F | Ht 68.0 in | Wt 192.1 lb

## 2011-07-20 DIAGNOSIS — J209 Acute bronchitis, unspecified: Secondary | ICD-10-CM

## 2011-07-20 DIAGNOSIS — J440 Chronic obstructive pulmonary disease with acute lower respiratory infection: Secondary | ICD-10-CM

## 2011-07-20 NOTE — Patient Instructions (Signed)
I'm glad you are starting to feel better  Finish the prednisone and antibiotic  Keep thinking about quitting smoking  If wheezing or other symptoms worsen again please let me know

## 2011-07-20 NOTE — Progress Notes (Signed)
Subjective:    Patient ID: Lori Orozco, female    DOB: 1950-11-17, 61 y.o.   MRN: [id number]  HPI Is feeling much better in terms of breathing - coughing up lots of sputum , yellow and green - all day long On prednisone and also zpack  Wheezing is not as bad  Using her inhalers   Has still been smoking- but less   No fever beyond 2 days (99)  Is tired but feeling better Tolerating steroids ok - but they keep her up all night   Patient Active Problem List  Diagnoses  . HYPERTHYROIDISM  . HYPERCHOLESTEROLEMIA, PURE  . ANEMIA  . DEPRESSION, MAJOR  . BIPOLAR AFFECTIVE DISORDER  . ANXIETY  . PERSONALITY DISORDER  . PERIPHERAL NEUROPATHY, FEET  . GUILLAIN-BARRE SYNDROME  . AMAUROSIS FUGAX  . COPD  . ESOPHAGEAL SPASM  . HIATAL HERNIA  . MENOPAUSAL SYNDROME  . BACK PAIN  . HAND PAIN, BILATERAL  . CRAMP IN LIMB  . SYMPTOM, SYNDROME, CHRONIC FATIGUE  . TREMOR  . TOBACCO USER  . Acute bronchitis with chronic obstructive pulmonary disease (COPD)   Past Medical History  Diagnosis Date  . COPD (chronic obstructive pulmonary disease)   . Chronic fatigue syndrome   . Hyperlipidemia   . Anxiety   . Depression   . Irritable bowel syndrome   . Pneumonia   . Migraines   . Amaurosis fugax   . Asthma   . Cataract   . Emphysema of lung   . GERD (gastroesophageal reflux disease)   . Chronic kidney disease     frequency, nephritis  . Thyroid disease     Gra[place]   Past Surgical History  Procedure Date  . Temporomandibular joint surgery   . Abdominal hysterectomy   . Dexa-osteopenia   . Epicondylitis   . Foot surgery   . Carotid dopplar   . Doppler echocardiography   . Tonsillectomy   . Knee surgery     Left  . Trigger finger release   . Colonoscopy 05/26/2010    avms- otherwise nl , re check 10y   History  Substance Use Topics  . Smoking status: Current Everyday Smoker -- 1.0 packs/day    Types: Cigarettes  . Smokeless tobacco: Not on file  . Alcohol Use:  No   Family History  Problem Relation Age of Onset  . Thyroid disease Mother   . Hypertension Mother   . Kidney disease Mother   . Cancer Father   . Colon cancer Other    Allergies  Allergen Reactions  . Codeine Nausea Only    Makes pt stay awake  . Cyclosporine     REACTION: caused eyes to severly burn  . Erythromycin     REACTION: GI  . Penicillins     REACTION: rash  . Rabeprazole Sodium     REACTION: keeps her awake   Current Outpatient Prescriptions on File Prior to Visit  Medication Sig Dispense Refill  . albuterol (PROAIR HFA) 108 (90 BASE) MCG/ACT inhaler Inhale 2 puffs into the lungs every 4 (four) hours as needed for wheezing.  1 Inhaler  11  . aspirin 81 MG chewable tablet Chew 81 mg by mouth daily.        Marland Kitchen azithromycin (ZITHROMAX Z-PAK) 250 MG tablet Take 2 pills by mouth today and then one pill each day by mouth for 4 days  6 each  0  . buPROPion (WELLBUTRIN XL) 150 MG 24 hr tablet Take 150  mg by mouth daily.        . busPIRone (BUSPAR) 15 MG tablet Take 15 mg by mouth 2 (two) times daily before a meal.        . cyclobenzaprine (FLEXERIL) 10 MG tablet Take 10 mg by mouth 3 (three) times daily as needed.        . divalproex (DEPAKOTE) 500 MG 24 hr tablet Take 500 mg by mouth daily.        Marland Kitchen estrogens, conjugated, (PREMARIN) 0.625 MG tablet Take 0.625 mg by mouth daily. Take daily for 21 days then do not take for 7 days.       . fluticasone (FLONASE) 50 MCG/ACT nasal spray Place 2 sprays into the nose daily.  16 g  11  . fluticasone (FLOVENT HFA) 44 MCG/ACT inhaler Inhale 1 puff into the lungs 2 (two) times daily as needed.  1 Inhaler  11  . gabapentin (NEURONTIN) 300 MG capsule Take 300 mg by mouth 3 (three) times daily.      Marland Kitchen ibuprofen (ADVIL,MOTRIN) 200 MG tablet Take 200 mg by mouth every 8 (eight) hours as needed.        Marland Kitchen LORazepam (ATIVAN) 1 MG tablet Take 1 mg by mouth every 8 (eight) hours as needed.        . mirtazapine (REMERON) 45 MG tablet Take 45 mg by  mouth at bedtime.        . predniSONE (DELTASONE) 10 MG tablet Take 4 pills once daily for 3 days by mouth and then 3 pills daily for 3 days and 2 pills daily for 3 days and then 1 pill daily for 3 days and then stop  30 tablet  0  . sertraline (ZOLOFT) 100 MG tablet Take 100 mg by mouth daily.        . simvastatin (ZOCOR) 20 MG tablet Take 1 tablet (20 mg total) by mouth at bedtime.  30 tablet  6  . tiotropium (SPIRIVA HANDIHALER) 18 MCG inhalation capsule Place 1 capsule (18 mcg total) into inhaler and inhale daily.  30 capsule  prn     Review of Systems Review of Systems  Constitutional: Negative for fever, appetite change, fatigue and unexpected weight change.  Eyes: Negative for pain and visual disturbance.  ENT pos for post nasal drip and ST from cough Respiratory: Neg for sob/ pos for cough and wheezing that are improved  Cardiovascular: Negative for cp or palpitations    Gastrointestinal: Negative for nausea, diarrhea and constipation.  Genitourinary: Negative for urgency and frequency.  Skin: Negative for pallor or rash   Neurological: Negative for weakness, light-headedness, numbness and headaches.  Hematological: Negative for adenopathy. Does not bruise/bleed easily.  Psychiatric/Behavioral: Negative for dysphoric mood. The patient is nervous from the prednisone- but doing ok       Objective:   Physical Exam  Constitutional: She appears well-developed and well-nourished. No distress.  HENT:  Head: Normocephalic and atraumatic.  Mouth/Throat: Oropharynx is clear and moist.  Eyes: Conjunctivae and EOM are normal. Pupils are equal, round, and reactive to light. Right eye exhibits no discharge. Left eye exhibits no discharge. No scleral icterus.  Neck: Normal range of motion. Neck supple.  Cardiovascular: Normal rate and regular rhythm.        Rate in low 90s at rest   Pulmonary/Chest: Effort normal. No respiratory distress. She has wheezes. She has no rales. She exhibits no  tenderness.       Diffusely distant bs  Harsh bs  throughtout  Few rhonchi No rales  Wheeze on forced exp   Lymphadenopathy:    She has no cervical adenopathy.  Neurological: She is alert.  Skin: Skin is warm and dry.          Assessment & Plan:

## 2011-07-23 NOTE — Assessment & Plan Note (Signed)
Much improved after start of prednisone taper and antibiotic  Will continue guifenesin to help with congestion  Has inhalers and using albuterol prn  Again disc smoking cessation-and pt is not ready to quit

## 2011-07-24 ENCOUNTER — Telehealth: Payer: Self-pay | Admitting: Family Medicine

## 2011-07-24 MED ORDER — AZITHROMYCIN 250 MG PO TABS: ORAL_TABLET | ORAL | Status: DC

## 2011-07-24 NOTE — Telephone Encounter (Signed)
Caller: Lori Orozco/Patient; PCP: Lori Manns A.; CB#: [phone]. Call regarding Message To PCP. Caller reports she was seen in the office on Tues 5/21 for congestion and was given a Zpack and Steroids. She was using her inhalers bid. Seen again on Friday 5/24 for follow-up appt, breathing was improved and she was advised to continue meds as directed. Completed her Zpack on Friday. Caller reports she began to use her Nebs yesterday, Monday 5/28 and she has 3 more days of tapering doses of steroids. Caller reports she is still coughing up thick yellow/greenish mucous and now has a low grade fever of 100. Taking fluids well, appetite is adequate. She is able to be up and about, but not as active as usual. Sleeping appx 3 hrs/night due to side effects of Prednisone. Caller would like PCP to be aware of same and would like further direction. She can be reached at above #.

## 2011-07-24 NOTE — Telephone Encounter (Signed)
Patient advised as instructed via telephone, Rx sent to [facility] electronically.  F/u appt scheduled for 07/27/2011 with Dr. Milinda Antis at 9:00am.

## 2011-07-24 NOTE — Telephone Encounter (Signed)
Lets extend the zithromax for another round and have her f/u this week for a re check  I am hesitant to extend the prednisone due to the insomnia , but if her breathing worsens, we will have to  Px written for call in

## 2011-07-27 ENCOUNTER — Ambulatory Visit (INDEPENDENT_AMBULATORY_CARE_PROVIDER_SITE_OTHER): Payer: Medicare Other | Admitting: Family Medicine

## 2011-07-27 ENCOUNTER — Encounter: Payer: Self-pay | Admitting: Family Medicine

## 2011-07-27 ENCOUNTER — Telehealth: Payer: Self-pay | Admitting: Family Medicine

## 2011-07-27 ENCOUNTER — Ambulatory Visit (INDEPENDENT_AMBULATORY_CARE_PROVIDER_SITE_OTHER)
Admission: RE | Admit: 2011-07-27 | Discharge: 2011-07-27 | Disposition: A | Discharge status: Home / Self Care | Payer: Medicare Other | Source: Ambulatory Visit | Attending: Family Medicine | Admitting: Family Medicine

## 2011-07-27 VITALS — BP 120/62 | HR 96 | Temp 98.7°F | Ht 68.0 in | Wt 190.2 lb

## 2011-07-27 DIAGNOSIS — J440 Chronic obstructive pulmonary disease with acute lower respiratory infection: Secondary | ICD-10-CM

## 2011-07-27 DIAGNOSIS — J209 Acute bronchitis, unspecified: Secondary | ICD-10-CM

## 2011-07-27 MED ORDER — LEVOFLOXACIN 500 MG PO TABS: 500.0000 mg | ORAL_TABLET | Freq: Every day | ORAL | Status: DC

## 2011-07-27 MED ORDER — PREDNISONE 10 MG PO TABS: ORAL_TABLET | ORAL | Status: DC

## 2011-07-27 NOTE — Assessment & Plan Note (Signed)
This is starting to improve after worsening - finishing 2nd zpak cxr today and exam is improved  Will repeat the prednisone taper-enc to use rescue bronchodialators when needed Again counseled strongly to quit smoking  Pend cxr report

## 2011-07-27 NOTE — Progress Notes (Signed)
Subjective:    Patient ID: Lori Orozco, female    DOB: [birth year], 61 y.o.   MRN: [id number]  HPI Here for f/u of acute bronchitis with copd   Was doing better after zpak and also pred taper  Then called with continued prod cough- yellow and fever of 100 Prednisone keeping her awake   Today is feeling some better  Still has raspy voice  Sputum is lighter in color - almost clear  Fever is 99 max now   Still wheezing - worse lying down Is exhausted , no energy at all  Thinks she will need more prednisone   Is using neb tx every 4 hours -- now does not need as much  No rescue inhaler this am   No change in smoking  Is just unable to quit at this time  Patient Active Problem List  Diagnoses  . HYPERTHYROIDISM  . HYPERCHOLESTEROLEMIA, PURE  . ANEMIA  . DEPRESSION, MAJOR  . BIPOLAR AFFECTIVE DISORDER  . ANXIETY  . PERSONALITY DISORDER  . PERIPHERAL NEUROPATHY, FEET  . GUILLAIN-BARRE SYNDROME  . AMAUROSIS FUGAX  . COPD  . ESOPHAGEAL SPASM  . HIATAL HERNIA  . MENOPAUSAL SYNDROME  . BACK PAIN  . HAND PAIN, BILATERAL  . CRAMP IN LIMB  . SYMPTOM, SYNDROME, CHRONIC FATIGUE  . TREMOR  . TOBACCO USER  . Acute bronchitis with chronic obstructive pulmonary disease (COPD)   Past Medical History  Diagnosis Date  . COPD (chronic obstructive pulmonary disease)   . Chronic fatigue syndrome   . Hyperlipidemia   . Anxiety   . Depression   . Irritable bowel syndrome   . Pneumonia   . Migraines   . Amaurosis fugax   . Asthma   . Cataract   . Emphysema of lung   . GERD (gastroesophageal reflux disease)   . Chronic kidney disease     frequency, nephritis  . Thyroid disease     Graves   Past Surgical History  Procedure Date  . Temporomandibular joint surgery   . Abdominal hysterectomy   . Dexa-osteopenia   . Epicondylitis   . Foot surgery   . Carotid dopplar   . Doppler echocardiography   . Tonsillectomy   . Knee surgery     Left  . Trigger finger release    . Colonoscopy 05/26/2010    avms- otherwise nl , re check 10y   History  Substance Use Topics  . Smoking status: Current Everyday Smoker -- 1.0 packs/day    Types: Cigarettes  . Smokeless tobacco: Not on file  . Alcohol Use: No   Family History  Problem Relation Age of Onset  . Thyroid disease Mother   . Hypertension Mother   . Kidney disease Mother   . Cancer Father   . Colon cancer Other    Allergies  Allergen Reactions  . Codeine Nausea Only    Makes pt stay awake  . Cyclosporine     REACTION: caused eyes to severly burn  . Erythromycin     REACTION: GI  . Penicillins     REACTION: rash  . Rabeprazole Sodium     REACTION: keeps her awake   Current Outpatient Prescriptions on File Prior to Visit  Medication Sig Dispense Refill  . albuterol (PROAIR HFA) 108 (90 BASE) MCG/ACT inhaler Inhale 2 puffs into the lungs every 4 (four) hours as needed for wheezing.  1 Inhaler  11  . aspirin 81 MG chewable tablet Chew 81 mg by  mouth daily.        Marland Kitchen azithromycin (ZITHROMAX Z-PAK) 250 MG tablet Take 2 pills by mouth today and then one pill each day by mouth for 4 days  6 each  0  . buPROPion (WELLBUTRIN XL) 150 MG 24 hr tablet Take 150 mg by mouth daily.        . busPIRone (BUSPAR) 15 MG tablet Take 15 mg by mouth 2 (two) times daily before a meal.        . cyclobenzaprine (FLEXERIL) 10 MG tablet Take 10 mg by mouth 3 (three) times daily as needed.        . divalproex (DEPAKOTE) 500 MG 24 hr tablet Take 500 mg by mouth daily.        Marland Kitchen estrogens, conjugated, (PREMARIN) 0.625 MG tablet Take 0.625 mg by mouth daily. Take daily for 21 days then do not take for 7 days.       . fluticasone (FLONASE) 50 MCG/ACT nasal spray Place 2 sprays into the nose daily.  16 g  11  . fluticasone (FLOVENT HFA) 44 MCG/ACT inhaler Inhale 1 puff into the lungs 2 (two) times daily as needed.  1 Inhaler  11  . gabapentin (NEURONTIN) 300 MG capsule Take 300 mg by mouth 3 (three) times daily.      Marland Kitchen ibuprofen  (ADVIL,MOTRIN) 200 MG tablet Take 200 mg by mouth every 8 (eight) hours as needed.        Marland Kitchen LORazepam (ATIVAN) 1 MG tablet Take 1 mg by mouth every 8 (eight) hours as needed.        . mirtazapine (REMERON) 45 MG tablet Take 45 mg by mouth at bedtime.        . sertraline (ZOLOFT) 100 MG tablet Take 100 mg by mouth daily.        . simvastatin (ZOCOR) 20 MG tablet Take 1 tablet (20 mg total) by mouth at bedtime.  30 tablet  6  . tiotropium (SPIRIVA HANDIHALER) 18 MCG inhalation capsule Place 1 capsule (18 mcg total) into inhaler and inhale daily.  30 capsule  prn     Review of Systems Review of Systems  Constitutional: Negative for fever, appetite change, fatigue and unexpected weight change.  Eyes: Negative for pain and visual disturbance.  ENT improvement in nasal cong/ no sinus pain / pos for hoarseness Respiratory: pos  for cough and shortness of breath.  And wheeze  Cardiovascular: Negative for cp or palpitations    Gastrointestinal: Negative for nausea, diarrhea and constipation.  Genitourinary: Negative for urgency and frequency.  Skin: Negative for pallor or rash   Neurological: Negative for weakness, light-headedness, numbness and headaches.  Hematological: Negative for adenopathy. Does not bruise/bleed easily.  Psychiatric/Behavioral: Negative for dysphoric mood. The patient is not nervous/anxious.  insomnia from prednisone        Objective:   Physical Exam  Constitutional: She appears well-developed and well-nourished. No distress.  HENT:  Head: Normocephalic and atraumatic.  Right Ear: External ear normal.  Left Ear: External ear normal.  Mouth/Throat: Oropharynx is clear and moist.  Eyes: Conjunctivae and EOM are normal. Pupils are equal, round, and reactive to light. No scleral icterus.  Neck: Normal range of motion. Neck supple.  Cardiovascular: Normal rate, regular rhythm and normal heart sounds.        Rate in high 80s  At rest  Pulmonary/Chest: No respiratory  distress. She has wheezes. She has no rales. She exhibits no tenderness.  Diffusely distant bs Harsh bs throughout Improved wheezing  Exp phase is not prolonged today- better air exch   Lymphadenopathy:    She has no cervical adenopathy.  Neurological: She is alert.  Skin: Skin is warm and dry. No rash noted. No pallor.  Psychiatric: She has a normal mood and affect.          Assessment & Plan:

## 2011-07-27 NOTE — Telephone Encounter (Signed)
cxr shows patchy area that could be pneumonia  Though zpak is good coverage- I want to extend with levaquin to be safe Px written for call in  -levaquin  Follow up with me in about 10-14 days please Call if worse at all

## 2011-07-27 NOTE — Patient Instructions (Signed)
Chest xray today  Will update you soon with result  Re start the prednisone taper Use your rescue inhaler or neb tx when you need it  Update if not starting to improve in a week or if worsening

## 2011-07-27 NOTE — Telephone Encounter (Signed)
Rx sent to pharmacy electronically, patient advised as instructed via telephone.

## 2011-08-06 ENCOUNTER — Ambulatory Visit (INDEPENDENT_AMBULATORY_CARE_PROVIDER_SITE_OTHER): Payer: Medicare Other | Admitting: Family Medicine

## 2011-08-06 ENCOUNTER — Encounter: Payer: Self-pay | Admitting: Family Medicine

## 2011-08-06 VITALS — BP 130/78 | HR 92 | Temp 98.1°F | Ht 68.0 in | Wt 189.0 lb

## 2011-08-06 DIAGNOSIS — J189 Pneumonia, unspecified organism: Secondary | ICD-10-CM

## 2011-08-06 NOTE — Assessment & Plan Note (Signed)
Now clinically resolved after course of levaquin and steroids  Much imp  Again disc smoking cessation-not ready to quit Rev her cxr  Will have this re checked likely at next pulm f/u Will call re : inc in price of spiriva  We did not have samples today

## 2011-08-06 NOTE — Patient Instructions (Signed)
I'm glad you are better ! - keep thinking about quitting smoking Call your pulmonary doctor about alternatives to spiriva - for cost matters  If cough or wheeze returns - let me know

## 2011-08-06 NOTE — Progress Notes (Signed)
Subjective:    Patient ID: [name], female    DOB: Jun 29, 1950, 61 y.o.   MRN: [id number]  HPI Here for f/u of pneumonia (patchy lingular opacity seen on cxr) with copd  Was put on levaquin after course of azithromycin Also steroids Continues to smoke -no change yet   Started getting better very quick after changing antibiotic  No longer sob  Pulse ox is 95%  No more fever   spiriva price went up -- will call pulm   Patient Active Problem List  Diagnoses  . HYPERTHYROIDISM  . HYPERCHOLESTEROLEMIA, PURE  . ANEMIA  . DEPRESSION, MAJOR  . BIPOLAR AFFECTIVE DISORDER  . ANXIETY  . PERSONALITY DISORDER  . PERIPHERAL NEUROPATHY, FEET  . GUILLAIN-BARRE SYNDROME  . AMAUROSIS FUGAX  . COPD  . ESOPHAGEAL SPASM  . HIATAL HERNIA  . MENOPAUSAL SYNDROME  . BACK PAIN  . HAND PAIN, BILATERAL  . CRAMP IN LIMB  . SYMPTOM, SYNDROME, CHRONIC FATIGUE  . TREMOR  . TOBACCO USER  . Acute bronchitis with chronic obstructive pulmonary disease (COPD)  . Lingular pneumonia   Past Medical History  Diagnosis Date  . COPD (chronic obstructive pulmonary disease)   . Chronic fatigue syndrome   . Hyperlipidemia   . Anxiety   . Depression   . Irritable bowel syndrome   . Pneumonia   . Migraines   . Amaurosis fugax   . Asthma   . Cataract   . Emphysema of lung   . GERD (gastroesophageal reflux disease)   . Chronic kidney disease     frequency, nephritis  . Thyroid disease     Graves   Past Surgical History  Procedure Date  . Temporomandibular joint surgery   . Abdominal hysterectomy   . Dexa-osteopenia   . Epicondylitis   . Foot surgery   . Carotid dopplar   . Doppler echocardiography   . Tonsillectomy   . Knee surgery     Left  . Trigger finger release   . Colonoscopy 05/26/2010    avms- otherwise nl , re check 10y   History  Substance Use Topics  . Smoking status: Current Everyday Smoker -- 0.5 packs/day    Types: Cigarettes  . Smokeless tobacco: Not on file   . Alcohol Use: No   Family History  Problem Relation Age of Onset  . Thyroid disease Mother   . Hypertension Mother   . Kidney disease Mother   . Cancer Father   . Colon cancer Other    Allergies  Allergen Reactions  . Codeine Nausea Only    Makes pt stay awake  . Cyclosporine     REACTION: caused eyes to severly burn  . Erythromycin     REACTION: GI  . Penicillins     REACTION: rash  . Rabeprazole Sodium     REACTION: keeps her awake   Current Outpatient Prescriptions on File Prior to Visit  Medication Sig Dispense Refill  . albuterol (PROAIR HFA) 108 (90 BASE) MCG/ACT inhaler Inhale 2 puffs into the lungs every 4 (four) hours as needed for wheezing.  1 Inhaler  11  . aspirin 81 MG chewable tablet Chew 81 mg by mouth daily.        Marland Kitchen buPROPion (WELLBUTRIN XL) 150 MG 24 hr tablet Take 150 mg by mouth daily.        . cyclobenzaprine (FLEXERIL) 10 MG tablet Take 10 mg by mouth 3 (three) times daily as needed.        Marland Kitchen  divalproex (DEPAKOTE) 500 MG 24 hr tablet Take 500 mg by mouth daily.        Marland Kitchen estrogens, conjugated, (PREMARIN) 0.625 MG tablet Take 0.625 mg by mouth daily. Take daily for 21 days then do not take for 7 days.       . fluticasone (FLONASE) 50 MCG/ACT nasal spray Place 2 sprays into the nose daily.  16 g  11  . fluticasone (FLOVENT HFA) 44 MCG/ACT inhaler Inhale 1 puff into the lungs 2 (two) times daily as needed.  1 Inhaler  11  . gabapentin (NEURONTIN) 300 MG capsule Take 300 mg by mouth 3 (three) times daily.      Marland Kitchen ibuprofen (ADVIL,MOTRIN) 200 MG tablet Take 200 mg by mouth every 8 (eight) hours as needed.        Marland Kitchen LORazepam (ATIVAN) 1 MG tablet Take 1 mg by mouth every 8 (eight) hours as needed.        . mirtazapine (REMERON) 45 MG tablet Take 45 mg by mouth at bedtime.        . predniSONE (DELTASONE) 10 MG tablet Take 4 pills once daily for 3 days by mouth and then 3 pills daily for 3 days and 2 pills daily for 3 days and then 1 pill daily for 3 days and then  stop  30 tablet  0  . sertraline (ZOLOFT) 100 MG tablet Take 100 mg by mouth daily.        . simvastatin (ZOCOR) 20 MG tablet Take 1 tablet (20 mg total) by mouth at bedtime.  30 tablet  6  . tiotropium (SPIRIVA HANDIHALER) 18 MCG inhalation capsule Place 1 capsule (18 mcg total) into inhaler and inhale daily.  30 capsule  prn  . busPIRone (BUSPAR) 15 MG tablet Take 15 mg by mouth 2 (two) times daily before a meal.            Review of Systems Review of Systems  Constitutional: Negative for fever, appetite change, fatigue and unexpected weight change.  Eyes: Negative for pain and visual disturbance.  Respiratory: Negative for sob or wheeze, cough is much iproved  Cardiovascular: Negative for cp or palpitations    Gastrointestinal: Negative for nausea, diarrhea and constipation.  Genitourinary: Negative for urgency and frequency.  Skin: Negative for pallor or rash   Neurological: Negative for weakness, light-headedness, numbness and headaches.  Hematological: Negative for adenopathy. Does not bruise/bleed easily.  Psychiatric/Behavioral: Negative for dysphoric mood. The patient is not nervous/anxious.         Objective:   Physical Exam  Constitutional: She appears well-developed and well-nourished. No distress.  HENT:  Head: Normocephalic and atraumatic.  Right Ear: External ear normal.  Left Ear: External ear normal.  Nose: Nose normal.  Mouth/Throat: Oropharynx is clear and moist.  Eyes: Conjunctivae and EOM are normal. Pupils are equal, round, and reactive to light. Right eye exhibits no discharge. Left eye exhibits no discharge. No scleral icterus.  Neck: Normal range of motion. Neck supple.  Cardiovascular: Normal rate and regular rhythm.   Pulmonary/Chest: Effort normal and breath sounds normal. No respiratory distress. She has no wheezes. She has no rales. She exhibits no tenderness.       Much improved exam Wheeze only on forced expiration  Not sob    Lymphadenopathy:      She has no cervical adenopathy.  Neurological: She is alert.  Skin: Skin is warm and dry. No rash noted.  Psychiatric: She has a normal mood and affect.  Assessment & Plan:

## 2011-10-08 ENCOUNTER — Telehealth: Payer: Self-pay | Admitting: Family Medicine

## 2011-10-08 DIAGNOSIS — D649 Anemia, unspecified: Secondary | ICD-10-CM

## 2011-10-08 DIAGNOSIS — E7800 Pure hypercholesterolemia, unspecified: Secondary | ICD-10-CM

## 2011-10-08 DIAGNOSIS — E0590 Thyrotoxicosis, unspecified without thyrotoxic crisis or storm: Secondary | ICD-10-CM

## 2011-10-08 NOTE — Telephone Encounter (Signed)
Message copied by [name] on Mon Oct 08, 2011 10:28 PM ------      Message from: Alvina Chou      Created: Wed Oct 03, 2011 12:46 PM      Regarding: Lab orders for Tuesday, 8.13.13       Patient is scheduled for CPX labs, please order future labs, Thanks , Camelia Eng

## 2011-10-09 ENCOUNTER — Other Ambulatory Visit (INDEPENDENT_AMBULATORY_CARE_PROVIDER_SITE_OTHER): Payer: Medicare Other

## 2011-10-09 DIAGNOSIS — E0590 Thyrotoxicosis, unspecified without thyrotoxic crisis or storm: Secondary | ICD-10-CM

## 2011-10-09 DIAGNOSIS — D649 Anemia, unspecified: Secondary | ICD-10-CM

## 2011-10-09 DIAGNOSIS — E7800 Pure hypercholesterolemia, unspecified: Secondary | ICD-10-CM

## 2011-10-09 LAB — COMPREHENSIVE METABOLIC PANEL
Albumin: 4 g/dL (ref 3.5–5.2)
Alkaline Phosphatase: 53 U/L (ref 39–117)
BUN: 16 mg/dL (ref 6–23)
GFR: 49.93 mL/min — ABNORMAL LOW (ref >60.00)
Glucose, Bld: 84 mg/dL (ref 70–99)
Potassium: 4.7 mEq/L (ref 3.5–5.1)
Total Bilirubin: 0.3 mg/dL (ref 0.3–1.2)

## 2011-10-09 LAB — CBC WITH DIFFERENTIAL/PLATELET
Basophils Relative: 0.7 % (ref 0.0–3.0)
Eosinophils Relative: 2.2 % (ref 0.0–5.0)
HCT: 39.4 % (ref 36.0–46.0)
Lymphs Abs: 3.1 10*3/uL (ref 0.7–4.0)
MCV: 95.3 fl (ref 78.0–100.0)
Monocytes Absolute: 0.4 10*3/uL (ref 0.1–1.0)
RBC: 4.13 Mil/uL (ref 3.87–5.11)
WBC: 5.9 10*3/uL (ref 4.5–10.5)

## 2011-10-09 LAB — LIPID PANEL
Cholesterol: 177 mg/dL (ref 0–200)
LDL Cholesterol: 92 mg/dL (ref 0–99)
Triglycerides: 116 mg/dL (ref 0.0–149.0)

## 2011-10-19 ENCOUNTER — Ambulatory Visit (INDEPENDENT_AMBULATORY_CARE_PROVIDER_SITE_OTHER): Payer: Medicare Other | Admitting: Family Medicine

## 2011-10-19 ENCOUNTER — Encounter: Payer: Self-pay | Admitting: Family Medicine

## 2011-10-19 VITALS — BP 130/82 | HR 95 | Temp 97.9°F | Ht 68.0 in | Wt 188.8 lb

## 2011-10-19 DIAGNOSIS — E039 Hypothyroidism, unspecified: Secondary | ICD-10-CM

## 2011-10-19 DIAGNOSIS — E7800 Pure hypercholesterolemia, unspecified: Secondary | ICD-10-CM

## 2011-10-19 DIAGNOSIS — F17200 Nicotine dependence, unspecified, uncomplicated: Secondary | ICD-10-CM

## 2011-10-19 DIAGNOSIS — M8580 Other specified disorders of bone density and structure, unspecified site: Secondary | ICD-10-CM | POA: Insufficient documentation

## 2011-10-19 DIAGNOSIS — Z23 Encounter for immunization: Secondary | ICD-10-CM

## 2011-10-19 DIAGNOSIS — M949 Disorder of cartilage, unspecified: Secondary | ICD-10-CM

## 2011-10-19 DIAGNOSIS — Z1231 Encounter for screening mammogram for malignant neoplasm of breast: Secondary | ICD-10-CM | POA: Insufficient documentation

## 2011-10-19 MED ORDER — SIMVASTATIN 20 MG PO TABS: 20.0000 mg | ORAL_TABLET | Freq: Every day | ORAL | Status: DC

## 2011-10-19 MED ORDER — LEVOTHYROXINE SODIUM 50 MCG PO TABS: 50.0000 ug | ORAL_TABLET | Freq: Every day | ORAL | Status: DC

## 2011-10-19 NOTE — Patient Instructions (Addendum)
If you are interested in shingles vaccine in future - call your insurance company to see how coverage is and call us to schedule  We will schedule mammogram and dexa at check out  Keep working on quitting smoking  Start synthroid 50 mcg one daily  Schedule non fasting labs 4-6 weeks for thyroid

## 2011-10-19 NOTE — Progress Notes (Signed)
Subjective:    Patient ID: Lori Orozco, female    DOB: 1950-09-23, 61 y.o.   MRN: [id number]  HPI Here for check up of chronic medical conditions and to review health mt list  No new complaints   She was sent to a neurologist recently and has had a bunch of tests done  Will have biopsy on the bottom of her feet - for neuropathy  Is trying to get some lidocaine patches approved for her -ins will not pay  Is on gabapentin     Wt is stable with bmi of 28 bp is stable/ good   Lipids On zocor and diet Lab Results  Component Value Date   CHOL 177 10/09/2011   CHOL 181 04/07/2010   CHOL 151 02/07/2009   Lab Results  Component Value Date   HDL 61.60 10/09/2011   HDL 56 04/07/2010   HDL 49.00 02/07/2009   Lab Results  Component Value Date   LDLCALC 92 10/09/2011   LDLCALC 90 04/07/2010   LDLCALC 83 02/07/2009   Lab Results  Component Value Date   TRIG 116.0 10/09/2011   TRIG 173* 04/07/2010   TRIG 93.0 02/07/2009   Lab Results  Component Value Date   CHOLHDL 3 10/09/2011   CHOLHDL 3.2 Ratio 04/07/2010   CHOLHDL 3 02/07/2009   No results found for this basename: LDLDIRECT    Cholesterol is very well controlled  Hx of retinal vein occlusion  On hrt-- wants to stay on HRT , understands the risks   Tab status - is at about 1 ppd or sometimes less  Plans to try to quit with the elect cig-bought several  Her quit date will be anniv of mother's death  tsh is high this time  Hx of graves in the past  Has chronic fatigue   Had hysterectomy No hx of abn paps  No problems or complaints   Zoster status Wants vaccine if her ins covers them    Td due Due to get that today    mammo 4/12 Is due for that - wants to get that in [place] Self exam   colonosc 2/12 with 10 y f/u   dexa has been a while - at least 4 years  Osteopenia - takes ca and D   Patient Active Problem List  Diagnosis  . HYPERTHYROIDISM  . HYPERCHOLESTEROLEMIA, PURE  . ANEMIA  .  DEPRESSION, MAJOR  . BIPOLAR AFFECTIVE DISORDER  . ANXIETY  . PERSONALITY DISORDER  . PERIPHERAL NEUROPATHY, FEET  . GUILLAIN-BARRE SYNDROME  . AMAUROSIS FUGAX  . COPD  . ESOPHAGEAL SPASM  . HIATAL HERNIA  . MENOPAUSAL SYNDROME  . BACK PAIN  . HAND PAIN, BILATERAL  . CRAMP IN LIMB  . SYMPTOM, SYNDROME, CHRONIC FATIGUE  . TREMOR  . TOBACCO USER  . Acute bronchitis with chronic obstructive pulmonary disease (COPD)  . Lingular pneumonia  . Osteopenia   Past Medical History  Diagnosis Date  . COPD (chronic obstructive pulmonary disease)   . Chronic fatigue syndrome   . Hyperlipidemia   . Anxiety   . Depression   . Irritable bowel syndrome   . Pneumonia   . Migraines   . Amaurosis fugax   . Asthma   . Cataract   . Emphysema of lung   . GERD (gastroesophageal reflux disease)   . Chronic kidney disease     frequency, nephritis  . Thyroid disease     Graves   Past Surgical History  Procedure  Date  . Temporomandibular joint surgery   . Abdominal hysterectomy   . Dexa-osteopenia   . Epicondylitis   . Foot surgery   . Carotid dopplar   . Doppler echocardiography   . Tonsillectomy   . Knee surgery     Left  . Trigger finger release   . Colonoscopy 05/26/2010    avms- otherwise nl , re check 10y   History  Substance Use Topics  . Smoking status: Current Everyday Smoker -- 0.5 packs/day    Types: Cigarettes  . Smokeless tobacco: Not on file  . Alcohol Use: No   Family History  Problem Relation Age of Onset  . Thyroid disease Mother   . Hypertension Mother   . Kidney disease Mother   . Cancer Father   . Colon cancer Other    Allergies  Allergen Reactions  . Codeine Nausea Only    Makes pt stay awake  . Cyclosporine     REACTION: caused eyes to severly burn  . Erythromycin     REACTION: GI  . Lithium Swelling  . Penicillins     REACTION: rash  . Rabeprazole Sodium     REACTION: keeps her awake   Current Outpatient Prescriptions on File Prior to  Visit  Medication Sig Dispense Refill  . albuterol (PROAIR HFA) 108 (90 BASE) MCG/ACT inhaler Inhale 2 puffs into the lungs every 4 (four) hours as needed for wheezing.  1 Inhaler  11  . aspirin 81 MG chewable tablet Chew 81 mg by mouth daily.        Marland Kitchen buPROPion (WELLBUTRIN XL) 150 MG 24 hr tablet Take 150 mg by mouth daily.        . busPIRone (BUSPAR) 15 MG tablet Take 15 mg by mouth 2 (two) times daily before a meal.        . cyclobenzaprine (FLEXERIL) 10 MG tablet Take 10 mg by mouth 3 (three) times daily as needed.        . divalproex (DEPAKOTE) 500 MG 24 hr tablet Take 500 mg by mouth daily.        Marland Kitchen estrogens, conjugated, (PREMARIN) 0.625 MG tablet Take 0.625 mg by mouth daily. Take daily for 21 days then do not take for 7 days.       . fluticasone (FLONASE) 50 MCG/ACT nasal spray Place 2 sprays into the nose daily.  16 g  11  . fluticasone (FLOVENT HFA) 44 MCG/ACT inhaler Inhale 1 puff into the lungs 2 (two) times daily as needed.  1 Inhaler  11  . gabapentin (NEURONTIN) 300 MG capsule Take 300 mg by mouth 3 (three) times daily.      Marland Kitchen ibuprofen (ADVIL,MOTRIN) 200 MG tablet Take 200 mg by mouth every 8 (eight) hours as needed.        Marland Kitchen LORazepam (ATIVAN) 1 MG tablet Take 1 mg by mouth every 8 (eight) hours as needed.        . mirtazapine (REMERON) 45 MG tablet Take 45 mg by mouth at bedtime.        . sertraline (ZOLOFT) 100 MG tablet Take 100 mg by mouth daily.        . simvastatin (ZOCOR) 20 MG tablet Take 1 tablet (20 mg total) by mouth at bedtime.  30 tablet  6  . tiotropium (SPIRIVA HANDIHALER) 18 MCG inhalation capsule Place 1 capsule (18 mcg total) into inhaler and inhale daily.  30 capsule  prn      Review of Systems Review  of Systems  Constitutional: Negative for fever, appetite change, and unexpected weight change. pos for chronic fatigue Eyes: Negative for pain and visual disturbance.  Respiratory: Negative for cough and pos for occ wheeze and sob from copd  Cardiovascular:  Negative for cp or palpitations    Gastrointestinal: Negative for nausea, diarrhea and constipation.  Genitourinary: Negative for urgency and frequency.  Skin: Negative for pallor or rash   Neurological: Negative for weakness, light-headedness, numbness and headaches. pos for burning of feet from neuropathy Hematological: Negative for adenopathy. Does not bruise/bleed easily.  Psychiatric/Behavioral: Negative for dysphoric mood. The patient is not nervous/anxious.         Objective:   Physical Exam  Constitutional: She appears well-developed and well-nourished. No distress.  HENT:  Head: Normocephalic and atraumatic.  Right Ear: External ear normal.  Left Ear: External ear normal.  Mouth/Throat: Oropharynx is clear and moist.       Nares are boggy  Eyes: Conjunctivae and EOM are normal. Pupils are equal, round, and reactive to light. Right eye exhibits no discharge. Left eye exhibits no discharge. No scleral icterus.  Neck: Normal range of motion. Neck supple. No JVD present. Carotid bruit is not present. No thyromegaly present.  Cardiovascular: Normal rate, regular rhythm, normal heart sounds and intact distal pulses.  Exam reveals no gallop.   Pulmonary/Chest: Effort normal and breath sounds normal. No respiratory distress. She has no wheezes.       Diffusely distant bs   Abdominal: Soft. Bowel sounds are normal. She exhibits no distension, no abdominal bruit and no mass. There is no tenderness.  Genitourinary: No breast swelling, tenderness, discharge or bleeding.       Breast exam: No mass, nodules, thickening, tenderness, bulging, retraction, inflamation, nipple discharge or skin changes noted.  No axillary or clavicular LA.  Chaperoned exam.    Musculoskeletal: Normal range of motion. She exhibits no edema.  Lymphadenopathy:    She has no cervical adenopathy.  Neurological: She is alert. She has normal reflexes. No cranial nerve deficit. She exhibits normal muscle tone.  Coordination normal.       Hypersensitive feet   Skin: Skin is warm and dry. No rash noted. No erythema. No pallor.  Psychiatric: She has a normal mood and affect.          Assessment & Plan:

## 2011-10-21 NOTE — Assessment & Plan Note (Signed)
Disc in detail risks of smoking and possible outcomes including copd, vascular/ heart disease, cancer , respiratory and sinus infections  Pt voices understanding  

## 2011-10-21 NOTE — Assessment & Plan Note (Signed)
Disc goals for lipids and reasons to control them Rev labs with pt Rev low sat fat diet in detail  Controlled on zocor Pt has hx of retinal vein occl

## 2011-10-21 NOTE — Assessment & Plan Note (Signed)
Needs dexa  Is smoker and now going on thyroid repl  Disc fx risk Disc need for ca and D Will schedule and then disc results

## 2011-10-21 NOTE — Assessment & Plan Note (Signed)
Scheduled annual screening mammogram Nl breast exam today  Encouraged monthly self exams   

## 2011-10-21 NOTE — Assessment & Plan Note (Signed)
Now tsh is up and pt is symptomatic Hx of grave's in past No goiter currently  Start synthroid 50 mcg daily Lab in 6 wk  Adv to update if any problems

## 2011-11-06 ENCOUNTER — Ambulatory Visit: Payer: Medicare Other

## 2011-11-06 ENCOUNTER — Ambulatory Visit (INDEPENDENT_AMBULATORY_CARE_PROVIDER_SITE_OTHER): Payer: Medicare Other

## 2011-11-06 DIAGNOSIS — N959 Unspecified menopausal and perimenopausal disorder: Secondary | ICD-10-CM

## 2011-11-06 DIAGNOSIS — Z1231 Encounter for screening mammogram for malignant neoplasm of breast: Secondary | ICD-10-CM

## 2011-11-06 LAB — HM DEXA SCAN: HM Dexa Scan: NORMAL

## 2011-11-07 ENCOUNTER — Ambulatory Visit (INDEPENDENT_AMBULATORY_CARE_PROVIDER_SITE_OTHER): Payer: Medicare Other | Admitting: Family Medicine

## 2011-11-07 ENCOUNTER — Encounter: Payer: Self-pay | Admitting: Family Medicine

## 2011-11-07 VITALS — BP 126/64 | HR 96 | Temp 98.1°F | Ht 68.0 in | Wt 189.8 lb

## 2011-11-07 DIAGNOSIS — J209 Acute bronchitis, unspecified: Secondary | ICD-10-CM

## 2011-11-07 DIAGNOSIS — J441 Chronic obstructive pulmonary disease with (acute) exacerbation: Secondary | ICD-10-CM | POA: Insufficient documentation

## 2011-11-07 DIAGNOSIS — F17200 Nicotine dependence, unspecified, uncomplicated: Secondary | ICD-10-CM

## 2011-11-07 MED ORDER — LEVOFLOXACIN 500 MG PO TABS: 500.0000 mg | ORAL_TABLET | Freq: Every day | ORAL | Status: AC

## 2011-11-07 MED ORDER — PREDNISONE 10 MG PO TABS: ORAL_TABLET | ORAL | Status: DC

## 2011-11-07 NOTE — Assessment & Plan Note (Signed)
With copd/ reactive airways and purulent mucous Cover with levaquin Fluids/ rest Prednisone taper  Update if not starting to improve in a week or if worsening   Can use mucinex DM for cough and congestion  Counseled on smoking cessation

## 2011-11-07 NOTE — Assessment & Plan Note (Signed)
With bronchitis pred taper 30 mg spiriva -continue Has albuterol hfa MDI and nebs to use prn Update if not starting to improve in a week or if worsening

## 2011-11-07 NOTE — Progress Notes (Signed)
Subjective:    Patient ID: Lori Orozco, female    DOB: February 15, 1951, 61 y.o.   MRN: [id number]  HPI Here with uri symptoms and cough  Started over the weekend  By Monday- progressively worse cough - green/ yellow mucous Lots of head congestion- also yellow mucous Ears are stopped up  No ST  Perhaps a little low grade fever last night- chills Ibuprofen helped   Wheezing -mild Using her proair rescue inhaler every 4 hours spriva regularly   ? Sick contacts -not really  Smoking is the same Has an E cigarette  Patient Active Problem List  Diagnosis  . HYPERCHOLESTEROLEMIA, PURE  . ANEMIA  . DEPRESSION, MAJOR  . BIPOLAR AFFECTIVE DISORDER  . ANXIETY  . PERSONALITY DISORDER  . PERIPHERAL NEUROPATHY, FEET  . GUILLAIN-BARRE SYNDROME  . AMAUROSIS FUGAX  . COPD  . ESOPHAGEAL SPASM  . HIATAL HERNIA  . MENOPAUSAL SYNDROME  . BACK PAIN  . HAND PAIN, BILATERAL  . SYMPTOM, SYNDROME, CHRONIC FATIGUE  . TREMOR  . TOBACCO USER  . Acute bronchitis with chronic obstructive pulmonary disease (COPD)  . Lingular pneumonia  . Osteopenia  . Other screening mammogram  . Hypothyroid   Past Medical History  Diagnosis Date  . COPD (chronic obstructive pulmonary disease)   . Chronic fatigue syndrome   . Hyperlipidemia   . Anxiety   . Depression   . Irritable bowel syndrome   . Pneumonia   . Migraines   . Amaurosis fugax   . Asthma   . Cataract   . Emphysema of lung   . GERD (gastroesophageal reflux disease)   . Chronic kidney disease     frequency, nephritis  . Thyroid disease     Graves   Past Surgical History  Procedure Date  . Temporomandibular joint surgery   . Abdominal hysterectomy   . Dexa-osteopenia   . Epicondylitis   . Foot surgery   . Carotid dopplar   . Doppler echocardiography   . Tonsillectomy   . Knee surgery     Left  . Trigger finger release   . Colonoscopy 05/26/2010    avms- otherwise nl , re check 10y   History  Substance Use Topics    . Smoking status: Current Every Day Smoker -- 0.5 packs/day    Types: Cigarettes  . Smokeless tobacco: Not on file  . Alcohol Use: No   Family History  Problem Relation Age of Onset  . Thyroid disease Mother   . Hypertension Mother   . Kidney disease Mother   . Cancer Father   . Colon cancer Other    Allergies  Allergen Reactions  . Codeine Nausea Only    Makes pt stay awake  . Cyclosporine     REACTION: caused eyes to severly burn  . Erythromycin     REACTION: GI  . Lithium Swelling  . Penicillins     REACTION: rash  . Rabeprazole Sodium     REACTION: keeps her awake   Current Outpatient Prescriptions on File Prior to Visit  Medication Sig Dispense Refill  . albuterol (PROAIR HFA) 108 (90 BASE) MCG/ACT inhaler Inhale 2 puffs into the lungs every 4 (four) hours as needed for wheezing.  1 Inhaler  11  . aspirin 81 MG chewable tablet Chew 81 mg by mouth daily.        Marland Kitchen buPROPion (WELLBUTRIN XL) 150 MG 24 hr tablet Take 150 mg by mouth daily.        Marland Kitchen  busPIRone (BUSPAR) 15 MG tablet Take 15 mg by mouth 2 (two) times daily before a meal.        . cyclobenzaprine (FLEXERIL) 10 MG tablet Take 10 mg by mouth 3 (three) times daily as needed.        . divalproex (DEPAKOTE) 500 MG 24 hr tablet Take 500 mg by mouth daily.        Marland Kitchen estrogens, conjugated, (PREMARIN) 0.625 MG tablet Take 0.625 mg by mouth daily. Take daily for 21 days then do not take for 7 days.       . fluticasone (FLONASE) 50 MCG/ACT nasal spray Place 2 sprays into the nose daily.  16 g  11  . fluticasone (FLOVENT HFA) 44 MCG/ACT inhaler Inhale 1 puff into the lungs 2 (two) times daily as needed.  1 Inhaler  11  . gabapentin (NEURONTIN) 300 MG capsule Take 300 mg by mouth 3 (three) times daily.      Marland Kitchen ibuprofen (ADVIL,MOTRIN) 200 MG tablet Take 200 mg by mouth every 8 (eight) hours as needed.        Marland Kitchen levothyroxine (SYNTHROID, LEVOTHROID) 50 MCG tablet Take 1 tablet (50 mcg total) by mouth daily.  30 tablet  11  .  LORazepam (ATIVAN) 1 MG tablet Take 1 mg by mouth every 8 (eight) hours as needed.        . mirtazapine (REMERON) 45 MG tablet Take 45 mg by mouth at bedtime.        . sertraline (ZOLOFT) 100 MG tablet Take 100 mg by mouth daily.        . simvastatin (ZOCOR) 20 MG tablet Take 1 tablet (20 mg total) by mouth at bedtime.  90 tablet  3  . tiotropium (SPIRIVA HANDIHALER) 18 MCG inhalation capsule Place 1 capsule (18 mcg total) into inhaler and inhale daily.  30 capsule  prn         Review of Systems Review of Systems  Constitutional: Negative for fever, appetite change, and unexpected weight change. pos for fatigue Eyes: Negative for pain and visual disturbance.  Respiratory: Negative for sob/ pos for prod cough and wheezing  Cardiovascular: Negative for cp or palpitations    Gastrointestinal: Negative for nausea, diarrhea and constipation.  Genitourinary: Negative for urgency and frequency.  Skin: Negative for pallor or rash   Neurological: Negative for weakness, light-headedness, numbness and headaches.  Hematological: Negative for adenopathy. Does not bruise/bleed easily.  Psychiatric/Behavioral: Negative for dysphoric mood. The patient is not nervous/anxious.         Objective:   Physical Exam  Constitutional: She appears well-developed and well-nourished. No distress.  HENT:  Head: Normocephalic and atraumatic.  Right Ear: External ear normal.  Left Ear: External ear normal.  Mouth/Throat: Oropharynx is clear and moist.       Nares are injected and congested   Some cerumen No facial tenderness  Eyes: Conjunctivae normal and EOM are normal. Pupils are equal, round, and reactive to light. Right eye exhibits no discharge. Left eye exhibits no discharge.  Neck: Normal range of motion.  Cardiovascular: Normal rate and normal heart sounds.        Mild tachycardia  Pulmonary/Chest: Effort normal. No respiratory distress. She has wheezes. She has no rales. She exhibits no  tenderness.       Diffuse rhonchi Diffusely distant bs Wheezing is mild/ expiratory Mildly prolonged exp phase  Lymphadenopathy:    She has no cervical adenopathy.  Neurological: She is alert.  Skin: Skin is  warm and dry. No rash noted. No erythema. No pallor.  Psychiatric: She has a normal mood and affect.          Assessment & Plan:

## 2011-11-07 NOTE — Patient Instructions (Addendum)
Take the levaquin as directed  Take the prednisone as directed Rest and drink fluids Work on stopping smoking  mucinex DM as needed for cough  Update if not starting to improve in a week or if worsening

## 2011-11-07 NOTE — Assessment & Plan Note (Signed)
Disc in detail risks of smoking and possible outcomes including copd, vascular/ heart disease, cancer , respiratory and sinus infections  Pt voices understanding  She is not ready to quit  Is trying e- cigarette however

## 2011-11-08 ENCOUNTER — Encounter: Payer: Self-pay | Admitting: *Deleted

## 2011-11-19 ENCOUNTER — Ambulatory Visit (INDEPENDENT_AMBULATORY_CARE_PROVIDER_SITE_OTHER): Payer: Medicare Other

## 2011-11-19 ENCOUNTER — Other Ambulatory Visit (INDEPENDENT_AMBULATORY_CARE_PROVIDER_SITE_OTHER): Payer: Medicare Other

## 2011-11-19 DIAGNOSIS — Z23 Encounter for immunization: Secondary | ICD-10-CM

## 2011-11-19 DIAGNOSIS — E039 Hypothyroidism, unspecified: Secondary | ICD-10-CM

## 2011-11-20 LAB — T4, FREE: Free T4: 1.08 ng/dL (ref 0.60–1.60)

## 2011-12-03 ENCOUNTER — Encounter: Payer: Self-pay | Admitting: Family Medicine

## 2012-03-04 ENCOUNTER — Encounter: Payer: Self-pay | Admitting: Internal Medicine

## 2012-03-04 ENCOUNTER — Ambulatory Visit (INDEPENDENT_AMBULATORY_CARE_PROVIDER_SITE_OTHER)
Admission: RE | Admit: 2012-03-04 | Discharge: 2012-03-04 | Disposition: A | Discharge status: Home / Self Care | Payer: Medicare Other | Source: Ambulatory Visit | Attending: Internal Medicine | Admitting: Internal Medicine

## 2012-03-04 ENCOUNTER — Ambulatory Visit (INDEPENDENT_AMBULATORY_CARE_PROVIDER_SITE_OTHER): Payer: Medicare Other | Admitting: Internal Medicine

## 2012-03-04 VITALS — BP 120/64 | HR 111 | Ht 68.0 in | Wt 180.6 lb

## 2012-03-04 DIAGNOSIS — F17200 Nicotine dependence, unspecified, uncomplicated: Secondary | ICD-10-CM

## 2012-03-04 DIAGNOSIS — J449 Chronic obstructive pulmonary disease, unspecified: Secondary | ICD-10-CM

## 2012-03-04 MED ORDER — IPRATROPIUM-ALBUTEROL 0.5-2.5 (3) MG/3ML IN SOLN: 3.0000 mL | Freq: Four times a day (QID) | RESPIRATORY_TRACT | Status: DC | PRN

## 2012-03-04 NOTE — Progress Notes (Signed)
Subjective:    Patient ID: Lori Orozco, female    DOB: 07/23/1950, 62 y.o.   MRN: [id number]  HPI 09/07/10- 1 yoF smoker  followed for COPD and tobacco use.  Last here March 07, 2010 Says she was treated for pneumonia in June treated by an UC in [place]. F/U cxr showed clearing after Avelox. Has had pneumovax.  Still smoking 1 PPD. We discussed smoking cessation program. Discussed meds.  Today feels fine. Aware of some DOE if she hurries.   03/06/11- 60 yoF smoker  followed for COPD and tobacco use. Since last here she lost her mother and is grieving. Had flu vaccine. Still smoking about a pack a day. She had finger surgery under general anesthesia at [facility] and brings preoperative spirometry done 10/27/2010 which showed FEV1 1.54, FEV1/FVC 0.63 and FEF 25-75% 0.84, indicating moderate obstructive airways disease with some restriction of forced vital capacity, possibly air trapping. Recognizes morning cough with only scant white sputum. Denies chest pain fever or blood. Short of breath with brisk exertion. Chest x-ray: 02/12/2011-stable, no active disease, old right rib fracture, healed.  03/04/12 61 yoF smoker  followed for COPD and tobacco use. FOLLOWS FOR: breathing about the same since last visit; has had to use inhalers more than normal-recently been sick Prolonged bronchitis/ pneumonia in May required antibiotics and prednisone before clearing. A few months later another episode of bronchitis which resolved and then a third episode in October when she also had a tooth abscess requiring extraction and antibiotics. Still smokes and I pointed out the negative impact her smoking with this history of repeated bronchitis. Can't afford Spiriva. CXR 5/ 31 /13 IMPRESSION:  Patchy lingular opacity, possibly reflecting pneumonia.  Follow-up radiographs are suggested to document clearing following  resolution of therapy.  Original Report Authenticated By: Charline Bills, M.D.    ROS-see HPI Constitutional:   No-   weight loss, night sweats, fevers, chills, fatigue, lassitude. HEENT:   No-  headaches, difficulty swallowing, tooth/dental problems, sore throat,       No-  sneezing, itching, ear ache, nasal congestion, post nasal drip,  CV:  No-   chest pain, orthopnea, PND, swelling in lower extremities, anasarca, dizziness, palpitations Resp: +shortness of breath with exertion or at rest.              + productive cough,  No non-productive cough,  No- coughing up of blood.               No-   change in color of mucus.  No- wheezing.   Skin: No-   rash or lesions. GI:  No-   heartburn, indigestion, abdominal pain, nausea, vomiting,  GU: MS:  No-   joint pain or swelling.  . Neuro-     nothing unusual Psych:  No- change in mood or affect. No depression or anxiety.  No memory loss.  Objective:   Physical Exam BP 120/64  Pulse 111  Ht 5\' 8"  (1.727 m)  Wt 180 lb 9.6 oz (81.92 kg)  BMI 27.46 kg/m2  SpO2 95% General- Alert, Oriented, Affect-appropriate, Distress- none acute. Odor of tobacco. Talkative Skin- rash-none, lesions- none, excoriation- none Lymphadenopathy- none Head- atraumatic            Eyes- Gross vision intact, PERRLA, conjunctivae clear secretions            Ears- Hearing, canals-normal            Nose- Clear, no-Septal dev, mucus, polyps, erosion, perforation  Throat- Mallampati III-IV , mucosa clear , drainage- none, tonsils- atrophic Neck- flexible , trachea midline, no stridor , thyroid nl, carotid no bruit Chest - symmetrical excursion , unlabored           Heart/CV- RRR , no murmur , no gallop  , no rub, nl s1 s2                           - JVD- none , edema- none, stasis changes- none, varices- none           Lung- +diminished but clear, cough- none , dullness-none, rub- none           Chest wall-  Abd-  Br/ Gen/ Rectal- Not done, not indicated Extrem- cyanosis- none, clubbing, none, atrophy- none, strength- nl Neuro-  grossly intact to observation

## 2012-03-04 NOTE — Patient Instructions (Addendum)
Order- CXR  Dx COPD  Order- DME nebulizer machine, with Duoneb neb solution  4 times daily as needed    Dx COPD  Lori Orozco- Please try to cut down and quit your smoking !

## 2012-03-06 NOTE — Progress Notes (Signed)
Quick Note:  Spoke with pt and notified of results per Dr. Young. Pt verbalized understanding and denied any questions.  ______ 

## 2012-03-15 NOTE — Assessment & Plan Note (Signed)
COPD with ongoing tobacco abuse and several bronchitis episodes including a pneumonia last May. Currently near her baseline but still smoking. Plan-smoking cessation discussion. She is not motivated. Chest x-ray, nebulizer, duo neb

## 2012-03-15 NOTE — Assessment & Plan Note (Signed)
Today we emphasized the connection between her repeated respiratory illnesses and her smoking habit and discussed available support including patches and other nicotine supplements.

## 2012-04-28 ENCOUNTER — Other Ambulatory Visit: Payer: Self-pay | Admitting: Family Medicine

## 2012-04-28 NOTE — Telephone Encounter (Signed)
Ok to refill 

## 2012-04-28 NOTE — Telephone Encounter (Signed)
Will refill electronically  

## 2012-05-20 ENCOUNTER — Encounter: Payer: Self-pay | Admitting: Family Medicine

## 2012-05-20 ENCOUNTER — Ambulatory Visit (INDEPENDENT_AMBULATORY_CARE_PROVIDER_SITE_OTHER): Payer: Medicare Other | Admitting: Family Medicine

## 2012-05-20 VITALS — BP 160/76 | HR 84 | Temp 98.1°F

## 2012-05-20 DIAGNOSIS — E039 Hypothyroidism, unspecified: Secondary | ICD-10-CM

## 2012-05-20 DIAGNOSIS — F17200 Nicotine dependence, unspecified, uncomplicated: Secondary | ICD-10-CM

## 2012-05-20 DIAGNOSIS — M549 Dorsalgia, unspecified: Secondary | ICD-10-CM

## 2012-05-20 LAB — TSH: TSH: 1.53 u[IU]/mL (ref 0.35–5.50)

## 2012-05-20 MED ORDER — HYDROCODONE-ACETAMINOPHEN 5-325 MG PO TABS: 1.0000 | ORAL_TABLET | Freq: Four times a day (QID) | ORAL | Status: DC | PRN

## 2012-05-20 NOTE — Assessment & Plan Note (Signed)
With moderate copd  Disc in detail risks of smoking and possible outcomes including copd, vascular/ heart disease, cancer , respiratory and sinus infections  Pt voices understanding - she is not yet ready to quit and will continue to contemplate it

## 2012-05-20 NOTE — Assessment & Plan Note (Signed)
Ongoing/ chronic with occas acute flares (in setting of chronic fatigue / widespread pain) Usually takes nsaid occas- uses norco very rarely for severe pain-needs new px since the formula changed Given the 5-325 # 30 which will last quite a while Enc to exercise low impact as tolerated

## 2012-05-20 NOTE — Progress Notes (Signed)
Subjective:    Patient ID: Lori Orozco, female    DOB: [birth year], 62 y.o.   MRN: [id number]  HPI Here for f/u of chronic medical problems  Had cataract surgery - in both eyes and her vision has not improved some - but will need a new glasses px   Had a stomach bug- that got better  Is trying lyrica for neuropathy- just increased that to see if it will work Problems with balance - and has to be very careful      bp is up a bit today (was up at neurol also)- usually comes down after sitting  2nd check 140/70 bp in fair control at this time  No changes needed  Disc lifstyle change with low sodium diet and exercise   Smoking status- about the same amount - no changes  Still wants to quit when she is ready  Not ready yet  Still dealing with mothers death/ grief and her affairs   Hypothyroid On med No clinical changes No goiter or neck discomfort No skin or hair or energy changes  Lab Results  Component Value Date   TSH 1.14 11/19/2011     dexa  Normal BMD this summer -that is reassuring   She uses hydrocodone for severe pain only -- takes it very rarely for her chronic pain / back/ hand / arthritis  Very infrequent Needs refil  Wants to be able to do a bit of low impact exercise  Patient Active Problem List  Diagnosis  . HYPERCHOLESTEROLEMIA, PURE  . ANEMIA  . DEPRESSION, MAJOR  . BIPOLAR AFFECTIVE DISORDER  . ANXIETY  . PERSONALITY DISORDER  . PERIPHERAL NEUROPATHY, FEET  . GUILLAIN-BARRE SYNDROME  . AMAUROSIS FUGAX  . COPD  . ESOPHAGEAL SPASM  . HIATAL HERNIA  . MENOPAUSAL SYNDROME  . BACK PAIN  . HAND PAIN, BILATERAL  . SYMPTOM, SYNDROME, CHRONIC FATIGUE  . TREMOR  . TOBACCO USER  . Acute bronchitis with chronic obstructive pulmonary disease (COPD)  . Lingular pneumonia  . Osteopenia  . Other screening mammogram  . Hypothyroid  . Acute bronchitis  . COPD exacerbation   Past Medical History  Diagnosis Date  . COPD (chronic obstructive  pulmonary disease)   . Chronic fatigue syndrome   . Hyperlipidemia   . Anxiety   . Depression   . Irritable bowel syndrome   . Pneumonia   . Migraines   . Amaurosis fugax   . Asthma   . Cataract   . Emphysema of lung   . GERD (gastroesophageal reflux disease)   . Chronic kidney disease     frequency, nephritis  . Thyroid disease     Graves   Past Surgical History  Procedure Laterality Date  . Temporomandibular joint surgery    . Abdominal hysterectomy    . Dexa-osteopenia    . Epicondylitis    . Foot surgery    . Carotid dopplar    . Doppler echocardiography    . Tonsillectomy    . Knee surgery      Left  . Trigger finger release    . Colonoscopy  05/26/2010    avms- otherwise nl , re check 10y   History  Substance Use Topics  . Smoking status: Current Every Day Smoker -- 0.50 packs/day    Types: Cigarettes  . Smokeless tobacco: Not on file  . Alcohol Use: No   Family History  Problem Relation Age of Onset  . Thyroid disease Mother   .  Hypertension Mother   . Kidney disease Mother   . Cancer Father   . Colon cancer Other    Allergies  Allergen Reactions  . Codeine Nausea Only    Makes pt stay awake  . Cyclosporine     REACTION: caused eyes to severly burn  . Erythromycin     REACTION: GI  . Lithium Swelling  . Penicillins     REACTION: rash  . Rabeprazole Sodium     REACTION: keeps her awake   Current Outpatient Prescriptions on File Prior to Visit  Medication Sig Dispense Refill  . albuterol (PROAIR HFA) 108 (90 BASE) MCG/ACT inhaler Inhale 2 puffs into the lungs every 4 (four) hours as needed for wheezing.  1 Inhaler  11  . aspirin 81 MG chewable tablet Chew 81 mg by mouth daily.        Marland Kitchen buPROPion (WELLBUTRIN XL) 150 MG 24 hr tablet Take 150 mg by mouth daily.        . busPIRone (BUSPAR) 15 MG tablet Take 15 mg by mouth 2 (two) times daily before a meal.        . cyclobenzaprine (FLEXERIL) 10 MG tablet ONE TABLET BY MOUTH UP TO 3 TIMES DAILY AS  NEEDED FOR muscle spasms  90 tablet  1  . divalproex (DEPAKOTE) 500 MG 24 hr tablet Take 500 mg by mouth daily.        Marland Kitchen estrogens, conjugated, (PREMARIN) 0.625 MG tablet Take 0.625 mg by mouth daily. Take daily for 21 days then do not take for 7 days.       . fluticasone (FLONASE) 50 MCG/ACT nasal spray Place 2 sprays into the nose daily.  16 g  11  . fluticasone (FLOVENT HFA) 44 MCG/ACT inhaler Inhale 1 puff into the lungs 2 (two) times daily as needed.  1 Inhaler  11  . ibuprofen (ADVIL,MOTRIN) 200 MG tablet Take 200 mg by mouth every 8 (eight) hours as needed.        Marland Kitchen ipratropium-albuterol (DUONEB) 0.5-2.5 (3) MG/3ML SOLN Take 3 mLs by nebulization every 6 (six) hours as needed.  360 mL  prn  . levothyroxine (SYNTHROID, LEVOTHROID) 50 MCG tablet Take 1 tablet (50 mcg total) by mouth daily.  30 tablet  11  . LORazepam (ATIVAN) 1 MG tablet Take 1 mg by mouth every 8 (eight) hours as needed.        . mirtazapine (REMERON) 45 MG tablet Take 45 mg by mouth at bedtime.        . sertraline (ZOLOFT) 100 MG tablet Take 100 mg by mouth daily.        . simvastatin (ZOCOR) 20 MG tablet Take 1 tablet (20 mg total) by mouth at bedtime.  90 tablet  3   No current facility-administered medications on file prior to visit.        Review of Systems     Objective:   Physical Exam  Constitutional: She appears well-developed and well-nourished. No distress.  HENT:  Head: Normocephalic and atraumatic.  Right Ear: External ear normal.  Left Ear: External ear normal.  Mouth/Throat: Oropharynx is clear and moist. No oropharyngeal exudate.  Nares are boggy  Eyes: Conjunctivae and EOM are normal. Pupils are equal, round, and reactive to light. No scleral icterus.  Neck: Normal range of motion. Neck supple. No JVD present. Carotid bruit is not present. No thyromegaly present.  Cardiovascular: Normal rate, regular rhythm, normal heart sounds and intact distal pulses.  Exam reveals no  gallop.    Pulmonary/Chest: Effort normal and breath sounds normal. No respiratory distress. She has no rales.  Diffusely distant bs  occ end exp wheeze that is mild  Abdominal: Soft. Bowel sounds are normal. She exhibits no distension, no abdominal bruit and no mass. There is no tenderness.  Musculoskeletal: She exhibits no edema.  Lymphadenopathy:    She has no cervical adenopathy.  Neurological: She is alert. She has normal reflexes. No cranial nerve deficit. She exhibits normal muscle tone. Coordination normal.  Skin: Skin is warm and dry. No rash noted. No erythema. No pallor.  Psychiatric: She has a normal mood and affect.          Assessment & Plan:

## 2012-05-20 NOTE — Assessment & Plan Note (Signed)
No new symptoms/ clinically stable  tsh today and adj dose of levothyroxine if needed

## 2012-05-20 NOTE — Patient Instructions (Addendum)
Take norco for pain (severe) with caution Keep thinking about quitting smoking  Thyroid test today  Take care of yourself Follow up in 6 months for annual exam with labs prior

## 2012-05-21 ENCOUNTER — Encounter: Payer: Self-pay | Admitting: *Deleted

## 2012-05-27 ENCOUNTER — Ambulatory Visit (INDEPENDENT_AMBULATORY_CARE_PROVIDER_SITE_OTHER): Payer: Medicare Other | Admitting: Family Medicine

## 2012-05-27 ENCOUNTER — Encounter: Payer: Self-pay | Admitting: Family Medicine

## 2012-05-27 ENCOUNTER — Telehealth: Payer: Self-pay | Admitting: Family Medicine

## 2012-05-27 VITALS — BP 162/70 | HR 113 | Temp 98.8°F | Wt 180.0 lb

## 2012-05-27 DIAGNOSIS — J441 Chronic obstructive pulmonary disease with (acute) exacerbation: Secondary | ICD-10-CM

## 2012-05-27 MED ORDER — LEVOFLOXACIN 500 MG PO TABS: 500.0000 mg | ORAL_TABLET | Freq: Every day | ORAL | Status: DC

## 2012-05-27 MED ORDER — PREDNISONE 20 MG PO TABS: ORAL_TABLET | ORAL | Status: DC

## 2012-05-27 NOTE — Telephone Encounter (Signed)
Will see today.  

## 2012-05-27 NOTE — Patient Instructions (Signed)
Start the antibiotic and prednisone today.  Take the prednisone with food.  If you get profoundly short of breath, then go to the ER. Continue to use your inhalers and nebs.  Take care.

## 2012-05-27 NOTE — Telephone Encounter (Signed)
Patient Information:  Caller Name: Charlett  Phone: [phone]  Patient: Lori Orozco, Lori Orozco  Gender: Female  DOB: 20-Oct-1950  Age: 62 Years  PCP: Roxy Manns [facility])  Office Follow Up:  Does the office need to follow up with this patient?: No  Instructions For The Office: N/A   Symptoms  Reason For Call & Symptoms: Patient states she was in the office on 05/20/12 for check up.  She states now she has "the crud".  Sputum green, chills, body aches.  +Fever,  Wheezing and  treating with Nebulizer and inhalers . HX of COPD, Asthma and Bronchitis. Smoker- 1/2 ppd.  Last used Nebulizer was last night. Inhaler used this morning.  Needs more frequently. Advised 2 puffs now and repeat in twenty minutes.  Appt Scheduled.  Reviewed Health History In EMR: Yes  Reviewed Medications In EMR: Yes  Reviewed Allergies In EMR: Yes  Reviewed Surgeries / Procedures: Yes  Date of Onset of Symptoms: 05/24/2012  Treatments Tried: Nebulizer , inhalers, Ibuprofen x3 every 4 hours.  Treatments Tried Worked: No  Any Fever: Yes  Fever Taken: Oral  Fever Time Of Reading: 19:00:00  Fever Last Reading: 100.0  Guideline(s) Used:  Asthma Attack  Disposition Per Guideline:   See Today in Office  Reason For Disposition Reached:   Asthma medicine (nebulizer or inhaler) is needed more frequently than every 4 hours to keep you comfortable  Advice Given:  Quick-Relief Asthma Medicine:   Start your quick-relief medicine (e.g., albuterol, salbutamol) at the first sign of any coughing or shortness of breath (don't wait for wheezing). Use your inhaler (2 puffs each time) or nebulizer every 4 hours. Continue the quick-relief medicine until you have not wheezed or coughed for 48 hours.  The best "cough medicine" for an adult with asthma is always the asthma medicine (Note: Don't use cough suppressants, but cough drops may help a tickly cough).  Drinking Liquids:  Try to drink normal amount of liquids (e.g.,  water). Being adequately hydrated makes it easier to cough up the sticky lung mucus.  Humidifier:   If the air is dry, use a cool mist humidifier to prevent drying of the upper airway.  Avoid Triggers:  Avoid known triggers of asthma attacks (e.g., tobacco smoke, cats, other pets, feather pillows, exercise).  Call Back If:  Inhaled asthma medicine (nebulizer or inhaler) is needed more often than every 4 hours  Wheezing has not completely cleared after 5 days  You become worse.  Patient Will Follow Care Advice:  YES  Appointment Scheduled:  05/27/2012 15:15:00 Appointment Scheduled Provider:  Crawford Givens Clelia Croft) [facility])

## 2012-05-27 NOTE — Progress Notes (Signed)
Started having sx (cough) about 3-4 days ago.  Then had change in sputum, darker.  Chest felt tight. Started her nebs. Continued other inhalers.  Smoking, started back after the death of her mother.  No temps >100.4.  More SOB than baseline.  Not wheezing more than typical.  Nebs help some.  Used albuterol and flovent this AM before the OV.  She has diffuse aches.  H/o PNA last year.   Meds, vitals, and allergies reviewed.   ROS: See HPI.  Otherwise, noncontributory.  nad ncat Mmm Rrr, recheck pulse 100-104 Recheck pulse ox 94-95% on RA No inc in wob but diffuse wheeze and rhonchi in all lung fields w/o stridor.  Cough noted abd soft Ext w/o edema or cyanosis.

## 2012-05-28 NOTE — Assessment & Plan Note (Signed)
Would start steroids with steroid caution, levaquin and continue home meds.  To ER if more SOB.  She agrees.  Okay for outpatient f/u at this time. Routed to PCP as FYI.

## 2012-07-01 ENCOUNTER — Ambulatory Visit (INDEPENDENT_AMBULATORY_CARE_PROVIDER_SITE_OTHER): Payer: Medicare Other | Admitting: Internal Medicine

## 2012-07-01 ENCOUNTER — Encounter: Payer: Self-pay | Admitting: Internal Medicine

## 2012-07-01 VITALS — BP 128/76 | HR 88 | Ht 67.25 in | Wt 177.4 lb

## 2012-07-01 DIAGNOSIS — J449 Chronic obstructive pulmonary disease, unspecified: Secondary | ICD-10-CM

## 2012-07-01 DIAGNOSIS — J4489 Other specified chronic obstructive pulmonary disease: Secondary | ICD-10-CM

## 2012-07-01 DIAGNOSIS — J209 Acute bronchitis, unspecified: Secondary | ICD-10-CM

## 2012-07-01 MED ORDER — FLUTICASONE FUROATE-VILANTEROL 100-25 MCG/INH IN AEPB: 1.0000 | INHALATION_SPRAY | Freq: Every day | RESPIRATORY_TRACT | Status: DC

## 2012-07-01 MED ORDER — IPRATROPIUM-ALBUTEROL 0.5-2.5 (3) MG/3ML IN SOLN: 3.0000 mL | Freq: Four times a day (QID) | RESPIRATORY_TRACT | Status: DC | PRN

## 2012-07-01 NOTE — Patient Instructions (Addendum)
Script for duoneb neb solution (albuterol plus ipratropium) to price compare at drug store  Sample Breo Ellipta 1 puff then rinse mouth, once daily        Try this instead of Flovent 110   Please keep trying to stop smoking- do it for your mother !

## 2012-07-01 NOTE — Progress Notes (Signed)
Subjective:    Patient ID: Lori Orozco, female    DOB: 1950/03/29, 62 y.o.   MRN: [id number]  HPI 09/07/10- 11 yoF smoker  followed for COPD and tobacco use.  Last here March 07, 2010 Says she was treated for pneumonia in June treated by an UC in [place]. F/U cxr showed clearing after Avelox. Has had pneumovax.  Still smoking 1 PPD. We discussed smoking cessation program. Discussed meds.  Today feels fine. Aware of some DOE if she hurries.   03/06/11- 60 yoF smoker  followed for COPD and tobacco use. Since last here she lost her mother and is grieving. Had flu vaccine. Still smoking about a pack a day. She had finger surgery under general anesthesia at [facility] and brings preoperative spirometry done 10/27/2010 which showed FEV1 1.54, FEV1/FVC 0.63 and FEF 25-75% 0.84, indicating moderate obstructive airways disease with some restriction of forced vital capacity, possibly air trapping. Recognizes morning cough with only scant white sputum. Denies chest pain fever or blood. Short of breath with brisk exertion. Chest x-ray: 02/12/2011-stable, no active disease, old right rib fracture, healed.  03/04/12 61 yoF smoker  followed for COPD and tobacco use. FOLLOWS FOR: breathing about the same since last visit; has had to use inhalers more than normal-recently been sick Prolonged bronchitis/ pneumonia in May required antibiotics and prednisone before clearing. A few months later another episode of bronchitis which resolved and then a third episode in October when she also had a tooth abscess requiring extraction and antibiotics. Still smokes and I pointed out the negative impact her smoking with this history of repeated bronchitis. Can't afford Spiriva. CXR 5/ 31 /13 IMPRESSION:  Patchy lingular opacity, possibly reflecting pneumonia.  Follow-up radiographs are suggested to document clearing following  resolution of therapy.  Original Report Authenticated By: Charline Bills, M.D.     07/01/12- 18 yoF smoker  followed for COPD and tobacco use. FOLLOWS FOR: good and bad days with breathing-sometimes struggle to walk long distances; Uses nebulizer and feels some what better. Still smoking one half pack per day despite all of the counseling. CXR 03/06/12 IMPRESSION:  No interval change or acute cardiopulmonary disease.  Original Report Authenticated By: Andreas Newport, M.D.   ROS-see HPI Constitutional:   No-   weight loss, night sweats, fevers, chills, fatigue, lassitude. HEENT:   No-  headaches, difficulty swallowing, tooth/dental problems, sore throat,       No-  sneezing, itching, ear ache, nasal congestion, post nasal drip,  CV:  No-   chest pain, orthopnea, PND, swelling in lower extremities, anasarca, dizziness, palpitations Resp: +shortness of breath with exertion or at rest.              + productive cough,  No non-productive cough,  No- coughing up of blood.               No-   change in color of mucus.  No- wheezing.   Skin: No-   rash or lesions. GI:  No-   heartburn, indigestion, abdominal pain, nausea, vomiting,  GU: MS:  No-   joint pain or swelling.  . Neuro-     nothing unusual Psych:  No- change in mood or affect. No depression or anxiety.  No memory loss.  Objective:   Physical Exam  General- Alert, Oriented, Affect-appropriate, Distress- none acute. Odor of tobacco. Talkative Skin- rash-none, lesions- none, excoriation- none Lymphadenopathy- none Head- atraumatic            Eyes-  Gross vision intact, PERRLA, conjunctivae clear secretions            Ears- Hearing, canals-normal            Nose- Clear, no-Septal dev, mucus, polyps, erosion, perforation             Throat- Mallampati III-IV , mucosa clear , drainage- none, tonsils- atrophic Neck- flexible , trachea midline, no stridor , thyroid nl, carotid no bruit Chest - symmetrical excursion , unlabored           Heart/CV- RRR , no murmur , no gallop  , no rub, nl s1 s2                            - JVD- none , edema- none, stasis changes- none, varices- none           Lung- +coarse wheeze, unlabored, cough+light , dullness-none, rub- none           Chest wall-  Abd-  Br/ Gen/ Rectal- Not done, not indicated Extrem- cyanosis- none, clubbing, none, atrophy- none, strength- nl Neuro- grossly intact to observation

## 2012-07-02 ENCOUNTER — Ambulatory Visit (INDEPENDENT_AMBULATORY_CARE_PROVIDER_SITE_OTHER): Payer: Medicare Other | Admitting: Obstetrics & Gynecology

## 2012-07-02 ENCOUNTER — Encounter: Payer: Self-pay | Admitting: Obstetrics & Gynecology

## 2012-07-02 VITALS — BP 128/68 | HR 85 | Resp 16 | Ht 67.25 in | Wt 177.0 lb

## 2012-07-02 DIAGNOSIS — Z01419 Encounter for gynecological examination (general) (routine) without abnormal findings: Secondary | ICD-10-CM

## 2012-07-02 DIAGNOSIS — Z0000 Encounter for general adult medical examination without abnormal findings: Secondary | ICD-10-CM

## 2012-07-02 MED ORDER — ESTROGENS CONJUGATED 0.625 MG PO TABS: 0.6250 mg | ORAL_TABLET | Freq: Every day | ORAL | Status: DC

## 2012-07-02 NOTE — Progress Notes (Signed)
Subjective:    Lori Orozco is a 62 y.o. female who presents for an annual exam. The patient has no complaints today. She wants a refill of her premarin. She has had some irritation and itching of her vulva. The patient is not currently sexually active. GYN screening history: last pap: was normal. The patient wears seatbelts: yes. The patient participates in regular exercise: no. Has the patient ever been transfused or tattooed?: not asked. The patient reports that there is not domestic violence in her life.   Menstrual History: OB History   Grav Para Term Preterm Abortions TAB SAB Ect Mult Living                  Menarche age: 21  No LMP recorded. Patient has had a hysterectomy.    The following portions of the patient's history were reviewed and updated as appropriate: allergies, current medications, past family history, past medical history, past social history, past surgical history and problem list.  Review of Systems A comprehensive review of systems was negative.    Objective:    BP 128/68  Pulse 85  Resp 16  Ht 5' 7.25" (1.708 m)  Wt 177 lb (80.287 kg)  BMI 27.52 kg/m2  General Appearance:    Alert, cooperative, no distress, appears stated age  Head:    Normocephalic, without obvious abnormality, atraumatic  Eyes:    PERRL, conjunctiva/corneas clear, EOM's intact, fundi    benign, both eyes  Ears:    Normal TM's and external ear canals, both ears  Nose:   Nares normal, septum midline, mucosa normal, no drainage    or sinus tenderness  Throat:   Lips, mucosa, and tongue normal; teeth and gums normal  Neck:   Supple, symmetrical, trachea midline, no adenopathy;    thyroid:  no enlargement/tenderness/nodules; no carotid   bruit or JVD  Back:     Symmetric, no curvature, ROM normal, no CVA tenderness  Lungs:     Clear to auscultation bilaterally, respirations unlabored  Chest Wall:    No tenderness or deformity   Heart:    Regular rate and rhythm, S1 and S2 normal,  no murmur, rub   or gallop  Breast Exam:    No tenderness, masses, or nipple abnormality  Abdomen:     Soft, non-tender, bowel sounds active all four quadrants,    no masses, no organomegaly  Genitalia:    Normal female without lesion, discharge or tenderness, almost complete obliteration of her vulva due to what appears to be lichen sclerosis. VERY tiny vaginal introitus. White plaque of posterior fourchette. Very skinny speculum required for vaginal exam (normal) Bimanual exam with my pinky reveals no masses     Extremities:   Extremities normal, atraumatic, no cyanosis or edema  Pulses:   2+ and symmetric all extremities  Skin:   Skin color, texture, turgor normal, no rashes or lesions  Lymph nodes:   Cervical, supraclavicular, and axillary nodes normal  Neurologic:   CNII-XII intact, normal strength, sensation and reflexes    throughout  .    Assessment:    Healthy female exam.  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 in 9/14 RTC for a vulvar biopsy I refilled her ERT (We again discussed risks and benefits)

## 2012-07-12 NOTE — Assessment & Plan Note (Signed)
Ongoing tobacco use against counseling. She refers to a depressing anniversary of her mother's death. Plan-try sample [employer]

## 2012-07-17 ENCOUNTER — Encounter: Payer: Self-pay | Admitting: Obstetrics & Gynecology

## 2012-07-17 ENCOUNTER — Ambulatory Visit (INDEPENDENT_AMBULATORY_CARE_PROVIDER_SITE_OTHER): Payer: Medicare Other | Admitting: Obstetrics & Gynecology

## 2012-07-17 VITALS — BP 136/82 | HR 109 | Resp 16 | Ht 67.25 in | Wt 174.0 lb

## 2012-07-17 DIAGNOSIS — N9089 Other specified noninflammatory disorders of vulva and perineum: Secondary | ICD-10-CM

## 2012-07-17 DIAGNOSIS — L940 Localized scleroderma [morphea]: Secondary | ICD-10-CM

## 2012-07-17 MED ORDER — CLOBETASOL PROPIONATE 0.05 % EX OINT: TOPICAL_OINTMENT | CUTANEOUS | Status: DC

## 2012-07-17 NOTE — Progress Notes (Signed)
  Subjective:    Patient ID: Lori Orozco, female    DOB: [birth year], 62 y.o.   MRN: [id number]  HPI  62 yo lady with severe vulvar itching and vulvar skin changes c/w lichen sclerosis. 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 Silver nitrate yielded hemostasis      Assessment & Plan:  As above I will go ahead and prescribe clobetasol

## 2012-07-24 ENCOUNTER — Telehealth: Payer: Self-pay | Admitting: Internal Medicine

## 2012-07-24 MED ORDER — FLUTICASONE FUROATE-VILANTEROL 100-25 MCG/INH IN AEPB: 1.0000 | INHALATION_SPRAY | Freq: Every day | RESPIRATORY_TRACT | Status: DC

## 2012-07-24 NOTE — Telephone Encounter (Signed)
Rx has been sent in. Pt is aware. Nothing further was needed. 

## 2012-07-29 ENCOUNTER — Encounter: Payer: Self-pay | Admitting: Obstetrics & Gynecology

## 2012-07-29 ENCOUNTER — Ambulatory Visit (INDEPENDENT_AMBULATORY_CARE_PROVIDER_SITE_OTHER): Payer: Medicare Other | Admitting: Obstetrics & Gynecology

## 2012-07-29 VITALS — BP 166/91 | HR 105 | Resp 16 | Ht 67.0 in | Wt 176.0 lb

## 2012-07-29 DIAGNOSIS — N904 Leukoplakia of vulva: Secondary | ICD-10-CM

## 2012-07-29 DIAGNOSIS — L940 Localized scleroderma [morphea]: Secondary | ICD-10-CM

## 2012-07-29 NOTE — Progress Notes (Signed)
  Subjective:    Patient ID: Lori Orozco, female    DOB: [birth year], 62 y.o.   MRN: [id number]  HPI  She is here to discuss her vulvar biopsy results. 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 She is using the temovate and having good relief from her itching.  Review of Systems     Objective:   Physical Exam        Assessment & Plan:  Lichen sclerosis I have discussed the increased risk of vulvar cancer and have recommended every 6 months exams here and monthly SVEs. I offered to add topical estrogen if her relief is not complete by next month.

## 2012-09-11 ENCOUNTER — Other Ambulatory Visit: Payer: Self-pay | Admitting: Family Medicine

## 2012-09-11 ENCOUNTER — Emergency Department (INDEPENDENT_AMBULATORY_CARE_PROVIDER_SITE_OTHER): Payer: Medicare Other

## 2012-09-11 ENCOUNTER — Encounter: Payer: Self-pay | Admitting: *Deleted

## 2012-09-11 ENCOUNTER — Emergency Department (INDEPENDENT_AMBULATORY_CARE_PROVIDER_SITE_OTHER)
Admission: EM | Admit: 2012-09-11 | Discharge: 2012-09-11 | Disposition: A | Discharge status: Home / Self Care | Payer: Medicare Other | Source: Home / Self Care | Attending: Family Medicine | Admitting: Family Medicine

## 2012-09-11 DIAGNOSIS — M25579 Pain in unspecified ankle and joints of unspecified foot: Secondary | ICD-10-CM

## 2012-09-11 DIAGNOSIS — S93402A Sprain of unspecified ligament of left ankle, initial encounter: Secondary | ICD-10-CM

## 2012-09-11 DIAGNOSIS — M7989 Other specified soft tissue disorders: Secondary | ICD-10-CM

## 2012-09-11 DIAGNOSIS — S93409A Sprain of unspecified ligament of unspecified ankle, initial encounter: Secondary | ICD-10-CM

## 2012-09-11 NOTE — ED Notes (Signed)
Pt c/o LT ankle injury after falling down the steps twice yesterday. She has applied ice, taken IBF, and Vicodin.

## 2012-09-11 NOTE — ED Provider Notes (Addendum)
History    CSN: [id number] Arrival date & time 09/11/12  1239  First MD Initiated Contact with Patient 09/11/12 1254     Chief Complaint  Patient presents with  . Ankle Pain      HPI Comments: Patient inverted her left ankle on stairs and fell yesterday.  Shortly afterwards, she inverted her ankle again.  She has had persistent pain/swelling laterally  Patient is a 62 y.o. female presenting with ankle pain. The history is provided by the patient.  Ankle Pain Lower extremity pain location: left ankle. Time since incident:  1 day Injury: yes   Mechanism of injury: fall   Fall:    Fall occurred:  Down stairs   Impact surface:  Hard floor Pain details:    Quality:  Throbbing and sharp   Radiates to:  Does not radiate   Severity:  Moderate   Onset quality:  Sudden   Duration:  1 day   Timing:  Constant   Progression:  Unchanged Chronicity:  New Prior injury to area:  Yes Relieved by:  Nothing Worsened by:  Bearing weight Associated symptoms: decreased ROM, stiffness, swelling and tingling   Associated symptoms: no back pain, no fatigue, no muscle weakness and no numbness    Past Medical History  Diagnosis Date  . COPD (chronic obstructive pulmonary disease)   . Chronic fatigue syndrome   . Hyperlipidemia   . Anxiety   . Depression   . Irritable bowel syndrome   . Pneumonia   . Migraines   . Amaurosis fugax   . Asthma   . Cataract   . Emphysema of lung   . GERD (gastroesophageal reflux disease)   . Chronic kidney disease     frequency, nephritis  . Thyroid disease     [place]   Past Surgical History  Procedure Laterality Date  . Temporomandibular joint surgery    . Abdominal hysterectomy    . Dexa-osteopenia    . Epicondylitis    . Foot surgery    . Carotid dopplar    . Doppler echocardiography    . Tonsillectomy    . Knee surgery      Left  . Trigger finger release    . Colonoscopy  05/26/2010    avms- otherwise nl , re check 10y  . Eye surgery      Family History  Problem Relation Age of Onset  . Thyroid disease Mother   . Hypertension Mother   . Kidney disease Mother   . Cancer Father   . Colon cancer Other    History  Substance Use Topics  . Smoking status: Current Every Day Smoker -- 0.50 packs/day for 43 years    Types: Cigarettes  . Smokeless tobacco: Not on file  . Alcohol Use: No   OB History   Grav Para Term Preterm Abortions TAB SAB Ect Mult Living                 Review of Systems  Constitutional: Negative for fatigue.  Musculoskeletal: Positive for stiffness. Negative for back pain.  All other systems reviewed and are negative.    Allergies  Codeine; Cyclosporine; Erythromycin; Lithium; Lyrica; Penicillins; Rabeprazole sodium; and Tegretol  Home Medications   Current Outpatient Rx  Name  Route  Sig  Dispense  Refill  . albuterol (PROAIR HFA) 108 (90 BASE) MCG/ACT inhaler   Inhalation   Inhale 2 puffs into the lungs every 4 (four) hours as needed for wheezing.  1 Inhaler   11   . aspirin 81 MG chewable tablet   Oral   Chew 81 mg by mouth daily.           Marland Kitchen buPROPion (WELLBUTRIN XL) 150 MG 24 hr tablet   Oral   Take 150 mg by mouth daily.           . busPIRone (BUSPAR) 15 MG tablet   Oral   Take 15 mg by mouth 2 (two) times daily before a meal.           . clobetasol ointment (TEMOVATE) 0.05 %      Apply to affected area daily for 2 weeks, then every other day   30 g   2   . cyanocobalamin 2000 MCG tablet   Oral   Take 2,000 mcg by mouth daily.         . cyclobenzaprine (FLEXERIL) 10 MG tablet      ONE TABLET BY MOUTH UP TO 3 TIMES DAILY AS NEEDED FOR muscle spasms   90 tablet   1     CYCLE FILL MEDICATION. Authorization is required f ...   . divalproex (DEPAKOTE) 500 MG 24 hr tablet   Oral   Take 500 mg by mouth daily.           Marland Kitchen estrogens, conjugated, (PREMARIN) 0.625 MG tablet   Oral   Take 1 tablet (0.625 mg total) by mouth daily. Take daily for 21 days  then do not take for 7 days.   31 tablet   13   . fluticasone (FLONASE) 50 MCG/ACT nasal spray   Nasal   Place 2 sprays into the nose daily.   16 g   11   . fluticasone (FLOVENT HFA) 44 MCG/ACT inhaler   Inhalation   Inhale 1 puff into the lungs 2 (two) times daily as needed.   1 Inhaler   11   . Fluticasone Furoate-Vilanterol (BREO ELLIPTA) 100-25 MCG/INH AEPB   Inhalation   Inhale 1 puff into the lungs daily.   1 each   2   . HYDROcodone-acetaminophen (NORCO/VICODIN) 5-325 MG per tablet   Oral   Take 1 tablet by mouth every 6 (six) hours as needed for pain.   30 tablet   0   . ibuprofen (ADVIL,MOTRIN) 200 MG tablet   Oral   Take 200 mg by mouth every 8 (eight) hours as needed.           Marland Kitchen ipratropium-albuterol (DUONEB) 0.5-2.5 (3) MG/3ML SOLN   Nebulization   Take 3 mLs by nebulization every 6 (six) hours as needed.   360 mL   prn   . levothyroxine (SYNTHROID, LEVOTHROID) 50 MCG tablet   Oral   Take 1 tablet (50 mcg total) by mouth daily.   30 tablet   11   . LORazepam (ATIVAN) 1 MG tablet   Oral   Take 1 mg by mouth every 8 (eight) hours as needed.           . mirtazapine (REMERON) 45 MG tablet   Oral   Take 45 mg by mouth at bedtime.           . sertraline (ZOLOFT) 100 MG tablet   Oral   Take 100 mg by mouth daily.           . simvastatin (ZOCOR) 20 MG tablet   Oral   Take 1 tablet (20 mg total) by mouth at bedtime.   90  tablet   3    BP 148/73  Pulse 96  Temp(Src) 98.1 F (36.7 C) (Oral)  Resp 18  Wt 175 lb (79.379 kg)  BMI 27.4 kg/m2  SpO2 96% Physical Exam  Nursing note and vitals reviewed. Constitutional: She is oriented to person, place, and time. She appears well-developed and well-nourished. No distress.  Eyes: Conjunctivae are normal. Pupils are equal, round, and reactive to light.  Musculoskeletal: She exhibits tenderness.       Left ankle: She exhibits decreased range of motion and swelling. She exhibits no ecchymosis,  no deformity, no laceration and normal pulse. Tenderness. Lateral malleolus, AITFL, CF ligament, posterior TFL and proximal fibula tenderness found. No medial malleolus and no head of 5th metatarsal tenderness found. Achilles tendon normal.       Feet:  Marked tenderness and swelling lateral malleolus as noted on diagram.  Patient has significant pain with attempted inversion of ankle joint.  Distal neurovascular function is intact.   Neurological: She is alert and oriented to person, place, and time.  Skin: Skin is warm and dry.    ED Course  Procedures  None   Labs Reviewed -    Dg Ankle Complete Left  09/11/2012   *RADIOLOGY REPORT*  Clinical Data: Inverted ankle with pain and swelling  LEFT ANKLE COMPLETE - 3+ VIEW  Comparison: None.  Findings: No acute fracture is seen.  The ankle joint appears normal and alignment is normal.  There is soft tissue swelling over the lateral malleolus.  IMPRESSION: No acute fracture.   Original Report Authenticated By: Dwyane Dee, M.D.   1. Ankle sprain, left, initial encounter.  Suspect either Grade 1 or 2 sprain     MDM  Ace wrap applied.  Dispensed AirCast stirrup splint Apply ice pack for 30 minutes every 1 to 4 hours until swelling decreases.  Elevate.  Use crutches for 5 to 7 days, then begin AirCast splint (patient already has crutches at home).  Wear Ace wrap until swelling decreases.  Wear AirCast brace for about 3 to 4 weeks.  Begin range of motion and stretching exercises in about 5 days as per instruction sheets.  Followup with [facility] if not improving about one week   Lattie Haw, MD 09/11/12 1405  Lattie Haw, MD 09/11/12 [phone]

## 2012-09-13 ENCOUNTER — Telehealth: Payer: Self-pay

## 2012-09-13 NOTE — ED Notes (Signed)
Left a message on voice mail asking how patient is feeling and advising to call back with any questions or concerns.  

## 2012-10-09 ENCOUNTER — Other Ambulatory Visit: Payer: Self-pay | Admitting: Family Medicine

## 2012-10-31 ENCOUNTER — Telehealth: Payer: Self-pay | Admitting: Internal Medicine

## 2012-10-31 MED ORDER — FLUTICASONE FUROATE-VILANTEROL 100-25 MCG/INH IN AEPB: 1.0000 | INHALATION_SPRAY | Freq: Every day | RESPIRATORY_TRACT | Status: DC

## 2012-10-31 NOTE — Telephone Encounter (Signed)
Pt is aware RX has been sent for breo. Nothing further needed

## 2012-11-08 ENCOUNTER — Emergency Department (INDEPENDENT_AMBULATORY_CARE_PROVIDER_SITE_OTHER): Payer: Medicare Other

## 2012-11-08 ENCOUNTER — Emergency Department (INDEPENDENT_AMBULATORY_CARE_PROVIDER_SITE_OTHER)
Admission: EM | Admit: 2012-11-08 | Discharge: 2012-11-08 | Disposition: A | Discharge status: Home / Self Care | Payer: Medicare Other | Source: Home / Self Care | Attending: Family Medicine | Admitting: Family Medicine

## 2012-11-08 ENCOUNTER — Encounter: Payer: Self-pay | Admitting: Emergency Medicine

## 2012-11-08 DIAGNOSIS — R05 Cough: Secondary | ICD-10-CM

## 2012-11-08 DIAGNOSIS — R509 Fever, unspecified: Secondary | ICD-10-CM

## 2012-11-08 DIAGNOSIS — J069 Acute upper respiratory infection, unspecified: Secondary | ICD-10-CM

## 2012-11-08 LAB — POCT CBC W AUTO DIFF (K'VILLE URGENT CARE)

## 2012-11-08 MED ORDER — PREDNISONE 20 MG PO TABS: 20.0000 mg | ORAL_TABLET | Freq: Two times a day (BID) | ORAL | Status: DC

## 2012-11-08 MED ORDER — DOXYCYCLINE HYCLATE 100 MG PO CAPS: 100.0000 mg | ORAL_CAPSULE | Freq: Two times a day (BID) | ORAL | Status: DC

## 2012-11-08 NOTE — ED Notes (Signed)
Reports 4 days of body aches, congestion, fever, sore throat and cough. Took ibuprofen at 1400.

## 2012-11-08 NOTE — ED Provider Notes (Signed)
CSN: [id number]     Arrival date & time 11/08/12  1652 History   First MD Initiated Contact with Patient 11/08/12 1730     Chief Complaint  Patient presents with  . Generalized Body Aches  . Fever  . Cough  . Nasal Congestion      HPI Comments: Patient complains of onset of URI symptoms four days ago with non-productive cough, sore throat, sinus congestion, myalgias, fatigue, and anterior chest tightness.  Her cough has increased as well as shortness of breath with activity, and she had a fever to 100 today. She has COPD and asthma, and has had pneumonia in the past.  She continues to smoke.  The history is provided by the patient.    Past Medical History  Diagnosis Date  . COPD (chronic obstructive pulmonary disease)   . Chronic fatigue syndrome   . Hyperlipidemia   . Anxiety   . Depression   . Irritable bowel syndrome   . Pneumonia   . Migraines   . Amaurosis fugax   . Asthma   . Cataract   . Emphysema of lung   . GERD (gastroesophageal reflux disease)   . Chronic kidney disease     frequency, nephritis  . Thyroid disease     [facility]   Past Surgical History  Procedure Laterality Date  . Temporomandibular joint surgery    . Abdominal hysterectomy    . Dexa-osteopenia    . Epicondylitis    . Foot surgery    . Carotid dopplar    . Doppler echocardiography    . Tonsillectomy    . Knee surgery      Left  . Trigger finger release    . Colonoscopy  05/26/2010    avms- otherwise nl , re check 10y  . Eye surgery     Family History  Problem Relation Age of Onset  . Thyroid disease Mother   . Hypertension Mother   . Kidney disease Mother   . Cancer Father   . Colon cancer Other    History  Substance Use Topics  . Smoking status: Current Every Day Smoker -- 0.50 packs/day for 43 years    Types: Cigarettes  . Smokeless tobacco: Not on file  . Alcohol Use: No   OB History   Grav Para Term Preterm Abortions TAB SAB Ect Mult Living                 Review of  Systems + sore throat + cough No pleuritic pain + wheezing + nasal congestion + post-nasal drainage No sinus pain/pressure No itchy/red eyes No earache No hemoptysis + SOB with activity + fever, + chills No nausea No vomiting No abdominal pain No diarrhea No urinary symptoms No skin rashes + fatigue + myalgias No headache Used OTC meds without relief  Allergies  Codeine; Cyclosporine; Erythromycin; Lithium; Lyrica; Penicillins; Rabeprazole sodium; and Tegretol  Home Medications   Current Outpatient Rx  Name  Route  Sig  Dispense  Refill  . albuterol (PROAIR HFA) 108 (90 BASE) MCG/ACT inhaler   Inhalation   Inhale 2 puffs into the lungs every 4 (four) hours as needed for wheezing.   1 Inhaler   11   . aspirin 81 MG chewable tablet   Oral   Chew 81 mg by mouth daily.           Marland Kitchen buPROPion (WELLBUTRIN XL) 150 MG 24 hr tablet   Oral   Take 150 mg by mouth  daily.           . busPIRone (BUSPAR) 15 MG tablet   Oral   Take 15 mg by mouth 2 (two) times daily before a meal.           . clobetasol ointment (TEMOVATE) 0.05 %      Apply to affected area daily for 2 weeks, then every other day   30 g   2   . cyanocobalamin 2000 MCG tablet   Oral   Take 2,000 mcg by mouth daily.         . cyclobenzaprine (FLEXERIL) 10 MG tablet      ONE TABLET BY MOUTH UP TO 3 TIMES DAILY AS NEEDED FOR muscle spasms   90 tablet   1     CYCLE FILL MEDICATION. Authorization is required f ...   . divalproex (DEPAKOTE) 500 MG 24 hr tablet   Oral   Take 500 mg by mouth daily.           Marland Kitchen doxycycline (VIBRAMYCIN) 100 MG capsule   Oral   Take 1 capsule (100 mg total) by mouth 2 (two) times daily.   20 capsule   0   . estrogens, conjugated, (PREMARIN) 0.625 MG tablet   Oral   Take 1 tablet (0.625 mg total) by mouth daily. Take daily for 21 days then do not take for 7 days.   31 tablet   13   . fluticasone (FLONASE) 50 MCG/ACT nasal spray   Nasal   Place 2 sprays  into the nose daily.   16 g   11   . fluticasone (FLOVENT HFA) 44 MCG/ACT inhaler   Inhalation   Inhale 1 puff into the lungs 2 (two) times daily as needed.   1 Inhaler   11   . Fluticasone Furoate-Vilanterol (BREO ELLIPTA) 100-25 MCG/INH AEPB   Inhalation   Inhale 1 puff into the lungs daily.   1 each   5   . HYDROcodone-acetaminophen (NORCO/VICODIN) 5-325 MG per tablet   Oral   Take 1 tablet by mouth every 6 (six) hours as needed for pain.   30 tablet   0   . ibuprofen (ADVIL,MOTRIN) 200 MG tablet   Oral   Take 200 mg by mouth every 8 (eight) hours as needed.           Marland Kitchen ipratropium-albuterol (DUONEB) 0.5-2.5 (3) MG/3ML SOLN   Nebulization   Take 3 mLs by nebulization every 6 (six) hours as needed.   360 mL   prn   . levothyroxine (SYNTHROID, LEVOTHROID) 50 MCG tablet      TAKE 1 TABLET (50 MCG TOTAL) BY MOUTH DAILY.   30 tablet   2   . LORazepam (ATIVAN) 1 MG tablet   Oral   Take 1 mg by mouth every 8 (eight) hours as needed.           . mirtazapine (REMERON) 45 MG tablet   Oral   Take 45 mg by mouth at bedtime.           . predniSONE (DELTASONE) 20 MG tablet   Oral   Take 1 tablet (20 mg total) by mouth 2 (two) times daily. Take with food.   10 tablet   0   . sertraline (ZOLOFT) 100 MG tablet   Oral   Take 100 mg by mouth daily.           . simvastatin (ZOCOR) 20 MG tablet  TAKE 1 TABLET (20 MG TOTAL) BY MOUTH AT BEDTIME.   30 tablet   2    BP 128/80  Pulse 96  Temp(Src) 98.2 F (36.8 C) (Oral)  Resp 16  Ht 5\' 7"  (1.702 m)  Wt 171 lb (77.565 kg)  BMI 26.78 kg/m2  SpO2 94% Physical Exam Nursing notes and Vital Signs reviewed. Appearance:  Patient appears older than stated age, and in no acute distress Eyes:  Pupils are equal, round, and reactive to light and accomodation.  Extraocular movement is intact.  Conjunctivae are not inflamed  Ears:  Canals normal.  Tympanic membranes normal.  Nose:  Mildly congested turbinates.  No  sinus tenderness.   Pharynx:  Normal Neck:  Supple.  ender shotty posterior nodes are palpated bilaterally  Lungs:   Diffuse rhonchi and wheezes bilaterally.  Breath sounds are equal.  Heart:  Regular rate and rhythm without murmurs, rubs, or gallops.  Abdomen:  Nontender without masses or hepatosplenomegaly.  Bowel sounds are present.  No CVA or flank tenderness.  Extremities:  No edema.  No calf tenderness Skin:  No rash present.   ED Course  Procedures  none    Labs Reviewed  POCT CBC W AUTO DIFF ([facility])  WBC 7.8; LY 30.8; MO 4.0; GR 65.2; Hgb 12.8; Platelets 186    Imaging Review Dg Chest 2 View  11/08/2012   CLINICAL DATA:  Cough.  EXAM: CHEST  2 VIEW  COMPARISON:  03/04/2012  FINDINGS: Biapical scarring. No acute airspace opacities. Mild hyperinflation of the lungs. Heart is normal size. No effusions or acute bony abnormality.  IMPRESSION: No active cardiopulmonary disease.   Electronically Signed   By: Charlett Nose M.D.   On: 11/08/2012 18:10    MDM   1. Acute upper respiratory infections of unspecified site; although her respiratory infection is likely viral, she is at risk for pneumonia with her history of COPD and asthma    Begin doxycycline, and prednisone burst. Take plain Mucinex (guaifenesin) twice daily for cough and congestion.  Increase fluid intake, rest. May use Afrin nasal spray (or generic oxymetazoline) twice daily for about 5 days.  Also recommend using saline nasal spray several times daily and saline nasal irrigation (AYR is a common brand) Stop all antihistamines for now, and other non-prescription cough/cold preparations. Continue inhalers as prescribed. May take Delsym Cough Suppressant at bedtime for nighttime cough.  Follow-up with family doctor if not improving 7 to 10 days. If symptoms become significantly worse during the night or over the weekend, proceed to the local emergency room.      Lattie Haw, MD 11/09/12 [phone]

## 2012-11-10 ENCOUNTER — Telehealth: Payer: Self-pay | Admitting: Family Medicine

## 2012-11-10 DIAGNOSIS — M8580 Other specified disorders of bone density and structure, unspecified site: Secondary | ICD-10-CM

## 2012-11-10 DIAGNOSIS — Z0000 Encounter for general adult medical examination without abnormal findings: Secondary | ICD-10-CM | POA: Insufficient documentation

## 2012-11-10 DIAGNOSIS — E7800 Pure hypercholesterolemia, unspecified: Secondary | ICD-10-CM

## 2012-11-10 DIAGNOSIS — D649 Anemia, unspecified: Secondary | ICD-10-CM

## 2012-11-10 DIAGNOSIS — E039 Hypothyroidism, unspecified: Secondary | ICD-10-CM

## 2012-11-10 NOTE — Telephone Encounter (Signed)
Message copied by [name] on Mon Nov 10, 2012  7:41 PM ------      Message from: Alvina Chou      Created: Thu Nov 06, 2012 10:58 AM      Regarding: Lab orders for Tuesday, 9.16.14       Patient is scheduled for CPX labs, please order future labs, Thanks , Terri       ------

## 2012-11-11 ENCOUNTER — Other Ambulatory Visit (INDEPENDENT_AMBULATORY_CARE_PROVIDER_SITE_OTHER): Payer: Medicare Other

## 2012-11-11 DIAGNOSIS — M8580 Other specified disorders of bone density and structure, unspecified site: Secondary | ICD-10-CM

## 2012-11-11 DIAGNOSIS — E039 Hypothyroidism, unspecified: Secondary | ICD-10-CM

## 2012-11-11 DIAGNOSIS — D649 Anemia, unspecified: Secondary | ICD-10-CM

## 2012-11-11 DIAGNOSIS — Z0000 Encounter for general adult medical examination without abnormal findings: Secondary | ICD-10-CM

## 2012-11-11 DIAGNOSIS — E7800 Pure hypercholesterolemia, unspecified: Secondary | ICD-10-CM

## 2012-11-11 LAB — COMPREHENSIVE METABOLIC PANEL
ALT: 13 U/L (ref 0–35)
Alkaline Phosphatase: 62 U/L (ref 39–117)
CO2: 30 mEq/L (ref 19–32)
Creatinine, Ser: 1 mg/dL (ref 0.4–1.2)
GFR: 59.63 mL/min — ABNORMAL LOW (ref >60.00)
Glucose, Bld: 81 mg/dL (ref 70–99)
Total Bilirubin: 0.3 mg/dL (ref 0.3–1.2)

## 2012-11-11 LAB — CBC WITH DIFFERENTIAL/PLATELET
Basophils Absolute: 0 10*3/uL (ref 0.0–0.1)
HCT: 39.6 % (ref 36.0–46.0)
Hemoglobin: 13.5 g/dL (ref 12.0–15.0)
Lymphs Abs: 3.7 10*3/uL (ref 0.7–4.0)
MCHC: 34 g/dL (ref 30.0–36.0)
MCV: 94.7 fl (ref 78.0–100.0)
Monocytes Absolute: 0.7 10*3/uL (ref 0.1–1.0)
Monocytes Relative: 7 % (ref 3.0–12.0)
Neutro Abs: 5.9 10*3/uL (ref 1.4–7.7)
RDW: 15.3 % — ABNORMAL HIGH (ref 11.5–14.6)

## 2012-11-11 LAB — LIPID PANEL
Cholesterol: 164 mg/dL (ref 0–200)
HDL: 60 mg/dL (ref >39.00)
VLDL: 17.6 mg/dL (ref 0.0–40.0)

## 2012-11-18 ENCOUNTER — Ambulatory Visit (INDEPENDENT_AMBULATORY_CARE_PROVIDER_SITE_OTHER): Payer: Medicare Other | Admitting: Family Medicine

## 2012-11-18 ENCOUNTER — Encounter: Payer: Self-pay | Admitting: Family Medicine

## 2012-11-18 VITALS — BP 130/70 | HR 95 | Temp 98.5°F | Ht 65.75 in | Wt 168.2 lb

## 2012-11-18 DIAGNOSIS — M899 Disorder of bone, unspecified: Secondary | ICD-10-CM

## 2012-11-18 DIAGNOSIS — E039 Hypothyroidism, unspecified: Secondary | ICD-10-CM

## 2012-11-18 DIAGNOSIS — J209 Acute bronchitis, unspecified: Secondary | ICD-10-CM | POA: Insufficient documentation

## 2012-11-18 DIAGNOSIS — M8580 Other specified disorders of bone density and structure, unspecified site: Secondary | ICD-10-CM

## 2012-11-18 DIAGNOSIS — Z9181 History of falling: Secondary | ICD-10-CM

## 2012-11-18 DIAGNOSIS — E7800 Pure hypercholesterolemia, unspecified: Secondary | ICD-10-CM

## 2012-11-18 DIAGNOSIS — F17200 Nicotine dependence, unspecified, uncomplicated: Secondary | ICD-10-CM

## 2012-11-18 DIAGNOSIS — Z0000 Encounter for general adult medical examination without abnormal findings: Secondary | ICD-10-CM

## 2012-11-18 MED ORDER — PREDNISONE 10 MG PO TABS: ORAL_TABLET | ORAL | Status: DC

## 2012-11-18 MED ORDER — FLUTICASONE PROPIONATE 50 MCG/ACT NA SUSP: 2.0000 | Freq: Every day | NASAL | Status: DC

## 2012-11-18 MED ORDER — AZITHROMYCIN 250 MG PO TABS: ORAL_TABLET | ORAL | Status: DC

## 2012-11-18 NOTE — Progress Notes (Signed)
Subjective:    Patient ID: Lori Orozco, female    DOB: September 16, 1950, 62 y.o.   MRN: [id number]  HPI I have personally reviewed the Medicare Annual Wellness questionnaire and have noted 1. The patient's medical and social history 2. Their use of alcohol, tobacco or illicit drugs 3. Their current medications and supplements 4. The patient's functional ability including ADL's, fall risks, home safety risks and hearing or visual             impairment. 5. Diet and physical activities 6. Evidence for depression or mood disorders  The patients weight, height, BMI have been recorded in the chart and visual acuity is per eye clinic.  I have made referrals, counseling and provided education to the patient based review of the above and I have provided the pt with a written personalized care plan for preventive services.  Hard summer  Had to gut the house her mother lived in - very difficult physically and emotionally- but it is done   She is getting over a resp infection- took doxycycline  No fever but achey  Bad cough - prod of phlegm that is light in color (better than it was)  Today was last day of doxycycline    See scanned forms.  Routine anticipatory guidance given to patient.  See health maintenance. Flu- wants to go ahead and get the shot (she has a hx of GB- but has had many flu shots without incident) Shingles- has not had the vaccine and ins will not pay for it  PNA vaccine 7/12 Tetanus 8/14 vaccine  Colon 3/12 was ok - [facility], recall in 10 years  Breast cancer screening 9/13 normal -she is due (got her letter in the mail)- gets that in [place] Has had a total hysterectomy- not doing pap smears  Has seen gyn for lichen sclerosis - steroid cream qod - is helping  Advance directive--she has a living will set up already  Cognitive function addressed- see scanned forms- and if abnormal then additional documentation follows. Has some memory issues with chronic fatigue-no  changes   Falls -she has 4 falls  Recently -- 3 in house and one in back of truck after slipping  Slipped down some stairs recently - fell on the cement  No particular injuries  Her balance is off - and has discussed this with neruologist- thinks it is a combo of pain/ neuropathy/ med side eff  She has not done PT     Mood - is treated for depression- ok overall / lot of stressors but ok and motivated (her medicine is working)  Hx of osteopenia but dexa was in nl range 9/13  Smoking -still about the same , she has seriously thought about quitting - getting her mother's estate settled now    Hyperlipidemia Lab Results  Component Value Date   CHOL 164 11/11/2012   CHOL 177 10/09/2011   CHOL 181 04/07/2010   Lab Results  Component Value Date   HDL 60.00 11/11/2012   HDL 62.13 10/09/2011   HDL 56 04/07/2010   Lab Results  Component Value Date   LDLCALC 86 11/11/2012   LDLCALC 92 10/09/2011   LDLCALC 90 04/07/2010   Lab Results  Component Value Date   TRIG 88.0 11/11/2012   TRIG 116.0 10/09/2011   TRIG 173* 04/07/2010   Lab Results  Component Value Date   CHOLHDL 3 11/11/2012   CHOLHDL 3 10/09/2011   CHOLHDL 3.2 Ratio 04/07/2010   No results found  for this basename: LDLDIRECT      PMH and SH reviewed  Meds, vitals, and allergies reviewed.   ROS: See HPI.  Otherwise negative.    Patient Active Problem List   Diagnosis Date Noted  . Encounter for Medicare annual wellness exam 11/18/2012  . Acute bronchitis with bronchospasm 11/18/2012  . History of falling 11/18/2012  . Routine general medical examination at a health care facility 11/10/2012  . 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 COPD exacerbation 11/07/2011  . Osteopenia 10/19/2011  . Other screening mammogram 10/19/2011  . Hypothyroid 10/19/2011  . Lingular pneumonia 08/06/2011  . Acute bronchitis with chronic obstructive pulmonary disease (COPD) 07/17/2011  . TOBACCO USER 03/07/2010  . ANEMIA  10/11/2009  . PERIPHERAL NEUROPATHY, FEET 08/05/2009  . BACK PAIN 08/05/2009  . HAND PAIN, BILATERAL 08/05/2009  . TREMOR 03/09/2008  . MENOPAUSAL SYNDROME 10/09/2007  . DEPRESSION, MAJOR 05/20/2007  . BIPOLAR AFFECTIVE DISORDER 05/20/2007  . ANXIETY 05/20/2007  . 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 ESOPHAGEAL SPASM 05/20/2007  . HIATAL HERNIA 05/20/2007  . AMAUROSIS FUGAX 05/07/2007  . COPD with chronic bronchitis 03/27/2007  . HYPERCHOLESTEROLEMIA, PURE 12/10/2006  . SYMPTOM, SYNDROME, CHRONIC FATIGUE 12/10/2006   Past Medical History  Diagnosis Date  . COPD (chronic obstructive pulmonary disease)   . Chronic fatigue syndrome   . Hyperlipidemia   . Anxiety   . Depression   . Irritable bowel syndrome   . Pneumonia   . Migraines   . Amaurosis fugax   . Asthma   . Cataract   . Emphysema of lung   . GERD (gastroesophageal reflux disease)   . Chronic kidney disease     frequency, nephritis  . Thyroid disease     [facility]   Past Surgical History  Procedure Laterality Date  . Temporomandibular joint surgery    . Abdominal hysterectomy    . Dexa-osteopenia    . Epicondylitis    . Foot surgery    . Carotid dopplar    . Doppler echocardiography    . Tonsillectomy    . Knee surgery      Left  . Trigger finger release    . Colonoscopy  05/26/2010    avms- otherwise nl , re check 10y  . Eye surgery     History  Substance Use Topics  . Smoking status: Current Every Day Smoker -- 0.50 packs/day for 43 years    Types: Cigarettes  . Smokeless tobacco: Not on file  . Alcohol Use: No   Family History  Problem Relation Age of Onset  . Thyroid disease Mother   . Hypertension Mother   . Kidney disease Mother   . Cancer Father   . Colon cancer Other    Allergies  Allergen Reactions  . Codeine Nausea Only    Makes pt stay awake  . Cyclosporine     REACTION: caused eyes to severly burn  . Erythromycin     REACTION: GI  .  Lithium Swelling  . Lyrica [Pregabalin]     Felt faint  . Penicillins     REACTION: rash  . Rabeprazole Sodium     REACTION: keeps her awake  . Tegretol [Carbamazepine]     Body aches and cold   Current Outpatient Prescriptions on File Prior to Visit  Medication Sig Dispense Refill  . albuterol (PROAIR HFA) 108 (90 BASE) MCG/ACT inhaler Inhale 2 puffs into the lungs every 4 (four) hours as  needed for wheezing.  1 Inhaler  11  . aspirin 81 MG chewable tablet Chew 81 mg by mouth daily.        Marland Kitchen buPROPion (WELLBUTRIN XL) 150 MG 24 hr tablet Take 150 mg by mouth daily.        . busPIRone (BUSPAR) 15 MG tablet Take 15 mg by mouth 2 (two) times daily before a meal.        . clobetasol ointment (TEMOVATE) 0.05 % Apply to affected area daily for 2 weeks, then every other day  30 g  2  . cyanocobalamin 2000 MCG tablet Take 2,000 mcg by mouth daily.      . cyclobenzaprine (FLEXERIL) 10 MG tablet ONE TABLET BY MOUTH UP TO 3 TIMES DAILY AS NEEDED FOR muscle spasms  90 tablet  1  . divalproex (DEPAKOTE) 500 MG 24 hr tablet Take 500 mg by mouth daily.        Marland Kitchen doxycycline (VIBRAMYCIN) 100 MG capsule Take 1 capsule (100 mg total) by mouth 2 (two) times daily.  20 capsule  0  . estrogens, conjugated, (PREMARIN) 0.625 MG tablet Take 1 tablet (0.625 mg total) by mouth daily. Take daily for 21 days then do not take for 7 days.  31 tablet  13  . fluticasone (FLOVENT HFA) 44 MCG/ACT inhaler Inhale 1 puff into the lungs 2 (two) times daily as needed.  1 Inhaler  11  . Fluticasone Furoate-Vilanterol (BREO ELLIPTA) 100-25 MCG/INH AEPB Inhale 1 puff into the lungs daily.  1 each  5  . HYDROcodone-acetaminophen (NORCO/VICODIN) 5-325 MG per tablet Take 1 tablet by mouth every 6 (six) hours as needed for pain.  30 tablet  0  . ibuprofen (ADVIL,MOTRIN) 200 MG tablet Take 200 mg by mouth every 8 (eight) hours as needed.        Marland Kitchen ipratropium-albuterol (DUONEB) 0.5-2.5 (3) MG/3ML SOLN Take 3 mLs by nebulization every 6  (six) hours as needed.  360 mL  prn  . levothyroxine (SYNTHROID, LEVOTHROID) 50 MCG tablet TAKE 1 TABLET (50 MCG TOTAL) BY MOUTH DAILY.  30 tablet  2  . LORazepam (ATIVAN) 1 MG tablet Take 1 mg by mouth every 8 (eight) hours as needed.        . mirtazapine (REMERON) 45 MG tablet Take 45 mg by mouth at bedtime.        . predniSONE (DELTASONE) 20 MG tablet Take 1 tablet (20 mg total) by mouth 2 (two) times daily. Take with food.  10 tablet  0  . sertraline (ZOLOFT) 100 MG tablet Take 100 mg by mouth daily.        . simvastatin (ZOCOR) 20 MG tablet TAKE 1 TABLET (20 MG TOTAL) BY MOUTH AT BEDTIME.  30 tablet  2   No current facility-administered medications on file prior to visit.    Review of Systems Review of Systems  Constitutional: Negative for fever, appetite change, fatigue and unexpected weight change.  ENT pos for cong and rhinorrhea  Eyes: Negative for pain and visual disturbance.  Respiratory: pos for cough and wheeze  Cardiovascular: Negative for cp or palpitations    Gastrointestinal: Negative for nausea, diarrhea and constipation.  Genitourinary: Negative for urgency and frequency.  Skin: Negative for pallor or rash   Neurological: Negative for weakness, light-headedness, numbness and headaches.  Hematological: Negative for adenopathy. Does not bruise/bleed easily.  Psychiatric/Behavioral: Negative for dysphoric mood. The patient is not nervous/anxious.         Objective:  Physical Exam  Constitutional: She appears well-developed and well-nourished. No distress.  HENT:  Head: Normocephalic and atraumatic.  Right Ear: External ear normal.  Left Ear: External ear normal.  Mouth/Throat: Oropharynx is clear and moist. No oropharyngeal exudate.  Nares are injected and congested    Eyes: Conjunctivae and EOM are normal. Pupils are equal, round, and reactive to light. Right eye exhibits no discharge. No scleral icterus.  Neck: Normal range of motion. Neck supple. No JVD  present. Carotid bruit is not present. No thyromegaly present.  Cardiovascular: Normal rate, regular rhythm, normal heart sounds and intact distal pulses.  Exam reveals no gallop.   Pulmonary/Chest: Effort normal. No respiratory distress. She has wheezes. She has no rales. She exhibits no tenderness.  Diffusely distant bs Rhonchi diffusely  Wheeze with slt prolonged exp phase   Abdominal: Soft. Bowel sounds are normal. She exhibits no distension, no abdominal bruit and no mass. There is no tenderness.  Musculoskeletal: She exhibits no edema and no tenderness.  Lymphadenopathy:    She has no cervical adenopathy.  Neurological: She is alert. She has normal reflexes. She displays no atrophy. No cranial nerve deficit or sensory deficit. She exhibits normal muscle tone. Coordination and gait normal.  Skin: Skin is warm and dry. No rash noted. No erythema. No pallor.  Psychiatric: She has a normal mood and affect.          Assessment & Plan:

## 2012-11-18 NOTE — Patient Instructions (Addendum)
For upper respiratory infection - take the zithromax and prednisone taper - update me if no improvement  Put off flu shot until you are feeling better  I want to refer you to physical therapy for balance - when you are ready for the referral let me know  Don't forget to make your mammogram appt  You need more fluids - work on that ! - especially water

## 2012-11-19 NOTE — Assessment & Plan Note (Signed)
S/p tx with doxy (in pt with copd) - still symptomatic  Per pt -neg cxr  Cover with zpak and pred taper 30 mg  Update if not starting to improve in a week or if worsening   counseled to quit smoking

## 2012-11-19 NOTE — Assessment & Plan Note (Signed)
Reviewed health habits including diet and exercise and skin cancer prevention Also reviewed health mt list, fam hx and immunizations  See HPI Labs rev

## 2012-11-19 NOTE — Assessment & Plan Note (Signed)
Strongly enc pt to consider PT for balance/ vestib training  May need a walker in the future She has neuropathy and poor balance and sedating medication  She will call when ready for ref Is a high fx risk

## 2012-11-19 NOTE — Assessment & Plan Note (Signed)
Disc goals for lipids and reasons to control them Rev labs with pt Rev low sat fat diet in detail   

## 2012-11-19 NOTE — Assessment & Plan Note (Signed)
dexa utd Rev ca and D  No fx Need to address falls

## 2012-11-19 NOTE — Assessment & Plan Note (Signed)
Disc in detail risks of smoking and possible outcomes including copd, vascular/ heart disease, cancer , respiratory and sinus infections  Pt voices understanding Pt states she is not ready to quit  

## 2012-11-19 NOTE — Assessment & Plan Note (Signed)
Hypothyroidism  Pt has no clinical changes No change in energy level/ hair or skin/ edema and no tremor Lab Results  Component Value Date   TSH 1.05 11/11/2012

## 2012-12-17 ENCOUNTER — Other Ambulatory Visit: Payer: Self-pay | Admitting: Family Medicine

## 2012-12-19 ENCOUNTER — Telehealth: Payer: Self-pay

## 2012-12-19 NOTE — Telephone Encounter (Signed)
Pt left v/m requesting cb about billing and coding issue. Pt said she has already spoke with [facility] rep and they will be contacting our office.

## 2012-12-30 ENCOUNTER — Encounter: Payer: Self-pay | Admitting: Internal Medicine

## 2012-12-30 ENCOUNTER — Ambulatory Visit (INDEPENDENT_AMBULATORY_CARE_PROVIDER_SITE_OTHER): Payer: Medicare Other | Admitting: Internal Medicine

## 2012-12-30 VITALS — BP 136/70 | HR 99 | Ht 66.0 in | Wt 172.0 lb

## 2012-12-30 DIAGNOSIS — J449 Chronic obstructive pulmonary disease, unspecified: Secondary | ICD-10-CM

## 2012-12-30 DIAGNOSIS — F17200 Nicotine dependence, unspecified, uncomplicated: Secondary | ICD-10-CM

## 2012-12-30 NOTE — Progress Notes (Signed)
Subjective:    Patient ID: Lori Orozco, female    DOB: 1950-08-25, 62 y.o.   MRN: [id number]  HPI 09/07/10- 76 yoF smoker  followed for COPD and tobacco use.  Last here March 07, 2010 Says she was treated for pneumonia in June treated by an UC in [place]. F/U cxr showed clearing after Avelox. Has had pneumovax.  Still smoking 1 PPD. We discussed smoking cessation program. Discussed meds.  Today feels fine. Aware of some DOE if she hurries.   03/06/11- 60 yoF smoker  followed for COPD and tobacco use. Since last here she lost her mother and is grieving. Had flu vaccine. Still smoking about a pack a day. She had finger surgery under general anesthesia at [facility] and brings preoperative spirometry done 10/27/2010 which showed FEV1 1.54, FEV1/FVC 0.63 and FEF 25-75% 0.84, indicating moderate obstructive airways disease with some restriction of forced vital capacity, possibly air trapping. Recognizes morning cough with only scant white sputum. Denies chest pain fever or blood. Short of breath with brisk exertion. Chest x-ray: 02/12/2011-stable, no active disease, old right rib fracture, healed.  03/04/12 61 yoF smoker  followed for COPD and tobacco use. FOLLOWS FOR: breathing about the same since last visit; has had to use inhalers more than normal-recently been sick Prolonged bronchitis/ pneumonia in May required antibiotics and prednisone before clearing. A few months later another episode of bronchitis which resolved and then a third episode in October when she also had a tooth abscess requiring extraction and antibiotics. Still smokes and I pointed out the negative impact her smoking with this history of repeated bronchitis. Can't afford Spiriva. CXR 5/ 31 /13 IMPRESSION:  Patchy lingular opacity, possibly reflecting pneumonia.  Follow-up radiographs are suggested to document clearing following  resolution of therapy.  Original Report Authenticated By: Charline Bills, M.D.     07/01/12- 8 yoF smoker  followed for COPD and tobacco use. FOLLOWS FOR: good and bad days with breathing-sometimes struggle to walk long distances; Uses nebulizer and feels some what better. Still smoking one half pack per day despite all of the counseling. CXR 03/06/12 IMPRESSION:  No interval change or acute cardiopulmonary disease.  Original Report Authenticated By: Andreas Newport, M.D.   12/30/12- 65 yoF smoker  followed for COPD and tobacco use. Follows For:  SOB since on Breo - Prod cough (clear, brown) - Occas wheezing - Denies chest tightness She continues to smoke making no effort to stop despite my counseling and offers of help. Had steroid injection for disc disease. Considers Breo Ellipta a significant help. CXR 11/08/12 IMPRESSION:  No active cardiopulmonary disease.  Electronically Signed  By: Charlett Nose M.D.  On: 11/08/2012 18:10   ROS-see HPI Constitutional:   No-   weight loss, night sweats, fevers, chills, fatigue, lassitude. HEENT:   No-  headaches, difficulty swallowing, tooth/dental problems, sore throat,       No-  sneezing, itching, ear ache, nasal congestion, post nasal drip,  CV:  No-   chest pain, orthopnea, PND, swelling in lower extremities, anasarca, dizziness, palpitations Resp: +shortness of breath with exertion or at rest.              + productive cough,  No non-productive cough,  No- coughing up of blood.               No-   change in color of mucus.  No- wheezing.   Skin: No-   rash or lesions. GI:  No-   heartburn, indigestion,  abdominal pain, nausea, vomiting,  GU: MS:  No-   joint pain or swelling.  . Neuro-     nothing unusual Psych:  No- change in mood or affect. No depression or anxiety.  No memory loss.  Objective:   Physical Exam  General- Alert, Oriented, Affect-appropriate, Distress- none acute. Odor of tobacco. Talkative Skin- rash-none, lesions- none, excoriation- none Lymphadenopathy- none Head- atraumatic            Eyes-  Gross vision intact, PERRLA, conjunctivae clear secretions            Ears- Hearing, canals-normal            Nose- Clear, no-Septal dev, mucus, polyps, erosion, perforation             Throat- Mallampati III-IV , mucosa clear , drainage- none, tonsils- atrophic Neck- flexible , trachea midline, no stridor , thyroid nl, carotid no bruit Chest - symmetrical excursion , unlabored           Heart/CV- RRR , no murmur , no gallop  , no rub, nl s1 s2                           - JVD- none , edema- none, stasis changes- none, varices- none           Lung- +coarse wheeze, unlabored, cough+light , dullness-none, rub- none           Chest wall-   Abd-  Br/ Gen/ Rectal- Not done, not indicated Extrem- cyanosis- none, clubbing, none, atrophy- none, strength- nl Neuro- grossly intact to observation

## 2012-12-30 NOTE — Patient Instructions (Signed)
We continue to encourage you to stop smoking- ask yourself every time- "Do I really need this one, or can I skip or delay it?"  We can continue present meds  Please call as needed

## 2013-01-09 ENCOUNTER — Other Ambulatory Visit: Payer: Self-pay | Admitting: Family Medicine

## 2013-01-14 NOTE — Assessment & Plan Note (Signed)
Symptom control Lori Orozco is quite good. Unfortunately she continues to smoke. We removed Flovent 44 from her medication list-not needed

## 2013-01-14 NOTE — Assessment & Plan Note (Signed)
Counseling done, support offered

## 2013-01-29 ENCOUNTER — Encounter: Payer: Self-pay | Admitting: Obstetrics & Gynecology

## 2013-01-29 ENCOUNTER — Ambulatory Visit (INDEPENDENT_AMBULATORY_CARE_PROVIDER_SITE_OTHER): Payer: Medicare Other | Admitting: Obstetrics & Gynecology

## 2013-01-29 ENCOUNTER — Ambulatory Visit (INDEPENDENT_AMBULATORY_CARE_PROVIDER_SITE_OTHER): Payer: Medicare Other

## 2013-01-29 VITALS — BP 147/71 | HR 99 | Resp 16 | Ht 67.0 in | Wt 175.0 lb

## 2013-01-29 DIAGNOSIS — L940 Localized scleroderma [morphea]: Secondary | ICD-10-CM

## 2013-01-29 DIAGNOSIS — N904 Leukoplakia of vulva: Secondary | ICD-10-CM | POA: Insufficient documentation

## 2013-01-29 DIAGNOSIS — Z0000 Encounter for general adult medical examination without abnormal findings: Secondary | ICD-10-CM

## 2013-01-29 DIAGNOSIS — Z1231 Encounter for screening mammogram for malignant neoplasm of breast: Secondary | ICD-10-CM

## 2013-01-29 NOTE — Progress Notes (Signed)
   Subjective:    Patient ID: Lori Orozco, female    DOB: 01-16-1951, 62 y.o.   MRN: [id number]  HPI  Lori Orozco is here for a vulva check. She is using a very small amount of clobetasol QOD at bedtime and reports that the itching and discomfort have resolved.  Review of Systems Her mammogram is due. She has had a flu vaccine this season.    Objective:   Physical Exam  Mild improvement of her lichen.  No evidence of vulvar cancer      Assessment & Plan:  Preventative care- schedule mammogram Lichen- continue clobetasol

## 2013-01-30 ENCOUNTER — Telehealth: Payer: Self-pay | Admitting: Family Medicine

## 2013-01-30 MED ORDER — SCOPOLAMINE 1 MG/3DAYS TD PT72: 1.0000 | MEDICATED_PATCH | TRANSDERMAL | Status: DC

## 2013-01-30 NOTE — Telephone Encounter (Signed)
Pt requesting scop patches for upcoming cruise.

## 2013-01-30 NOTE — Telephone Encounter (Signed)
Pt advise Rx sent and careful of sedation

## 2013-01-30 NOTE — Telephone Encounter (Signed)
I sent it to [facility] for her  Watch out for sedation  Have a great trip!

## 2013-02-12 ENCOUNTER — Other Ambulatory Visit (HOSPITAL_COMMUNITY): Payer: Self-pay | Admitting: Orthopedic Surgery

## 2013-02-20 ENCOUNTER — Telehealth: Payer: Self-pay | Admitting: Family Medicine

## 2013-02-20 NOTE — Telephone Encounter (Signed)
Pt states she is getting total hip replacement in mid January.  She wants to know if there is anything you need to see her for in the next couple of weeks?  She won't be able to come for a couple of months.

## 2013-02-22 NOTE — Telephone Encounter (Signed)
Not unless she needs preop exam for her surgeon  Good luck with everything

## 2013-02-23 ENCOUNTER — Ambulatory Visit (INDEPENDENT_AMBULATORY_CARE_PROVIDER_SITE_OTHER): Payer: Medicare Other | Admitting: Family Medicine

## 2013-02-23 ENCOUNTER — Telehealth: Payer: Self-pay

## 2013-02-23 ENCOUNTER — Encounter: Payer: Self-pay | Admitting: Family Medicine

## 2013-02-23 VITALS — BP 116/64 | HR 95 | Temp 98.5°F | Ht 67.0 in | Wt 180.5 lb

## 2013-02-23 DIAGNOSIS — J111 Influenza due to unidentified influenza virus with other respiratory manifestations: Secondary | ICD-10-CM | POA: Insufficient documentation

## 2013-02-23 DIAGNOSIS — R05 Cough: Secondary | ICD-10-CM

## 2013-02-23 MED ORDER — OSELTAMIVIR PHOSPHATE 75 MG PO CAPS: 75.0000 mg | ORAL_CAPSULE | Freq: Two times a day (BID) | ORAL | Status: DC

## 2013-02-23 NOTE — Telephone Encounter (Signed)
I will see her then  

## 2013-02-23 NOTE — Telephone Encounter (Signed)
Pt has hip replacement and cruise scheduled in Jan and pt has fever, non productive cough with wheezing and chills and head congestion.No CP. Pt scheduled appt this AM.

## 2013-02-23 NOTE — Progress Notes (Signed)
Pre-visit discussion using our clinic review tool. No additional management support is needed unless otherwise documented below in the visit note.  

## 2013-02-23 NOTE — Telephone Encounter (Signed)
Pt.notified

## 2013-02-23 NOTE — Progress Notes (Signed)
Subjective:    Patient ID: Lori Orozco, female    DOB: 02-Dec-1950, 62 y.o.   MRN: [id number]  HPI Here with uri symptoms  Just started getting sick yesterday am - had chills - temp went up to 101  Took ibuprofen -helpful  This am went up to 102 Now 98.5  Some nasal congestion - used neo synephrine yesterday Cough makes her chest sore (is not too bad however)  Small amt of phlegm  Breathing ok   Fever/ aches is the worst symptom    Rapid flu test is neg today  Patient Active Problem List   Diagnosis Date Noted  . 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 Encounter for Medicare annual wellness exam 11/18/2012  . Acute bronchitis with bronchospasm 11/18/2012  . History of falling 11/18/2012  . Routine general medical examination at a health care facility 11/10/2012  . 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 COPD exacerbation 11/07/2011  . Osteopenia 10/19/2011  . Other screening mammogram 10/19/2011  . Hypothyroid 10/19/2011  . Lingular pneumonia 08/06/2011  . Acute bronchitis with chronic obstructive pulmonary disease (COPD) 07/17/2011  . TOBACCO USER 03/07/2010  . ANEMIA 10/11/2009  . PERIPHERAL NEUROPATHY, FEET 08/05/2009  . BACK PAIN 08/05/2009  . HAND PAIN, BILATERAL 08/05/2009  . TREMOR 03/09/2008  . MENOPAUSAL SYNDROME 10/09/2007  . DEPRESSION, MAJOR 05/20/2007  . BIPOLAR AFFECTIVE DISORDER 05/20/2007  . ANXIETY 05/20/2007  . 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 ESOPHAGEAL SPASM 05/20/2007  . HIATAL HERNIA 05/20/2007  . AMAUROSIS FUGAX 05/07/2007  . COPD with chronic bronchitis 03/27/2007  . HYPERCHOLESTEROLEMIA, PURE 12/10/2006  . SYMPTOM, SYNDROME, CHRONIC FATIGUE 12/10/2006   Past Medical History  Diagnosis Date  . COPD (chronic obstructive pulmonary disease)   . Chronic fatigue syndrome   . Hyperlipidemia   . Anxiety   . Depression   . Irritable bowel syndrome   .  Pneumonia   . Migraines   . Amaurosis fugax   . Asthma   . Cataract   . Emphysema of lung   . GERD (gastroesophageal reflux disease)   . Chronic kidney disease     frequency, nephritis  . Thyroid disease     [place]  . Lumbar herniated disc    Past Surgical History  Procedure Laterality Date  . Temporomandibular joint surgery    . Abdominal hysterectomy    . Dexa-osteopenia    . Epicondylitis    . Foot surgery    . Carotid dopplar    . Doppler echocardiography    . Tonsillectomy    . Knee surgery      Left  . Trigger finger release    . Colonoscopy  05/26/2010    avms- otherwise nl , re check 10y  . Eye surgery     History  Substance Use Topics  . Smoking status: Current Every Day Smoker -- 0.50 packs/day for 43 years    Types: Cigarettes  . Smokeless tobacco: Not on file  . Alcohol Use: No   Family History  Problem Relation Age of Onset  . Thyroid disease Mother   . Hypertension Mother   . Kidney disease Mother   . Cancer Father   . Colon cancer Other    Allergies  Allergen Reactions  . Codeine Nausea Only    Makes pt stay awake  . Cyclosporine     REACTION: caused eyes to severly burn  . Erythromycin  REACTION: GI  . Lithium Swelling  . Lyrica [Pregabalin]     Felt faint  . Penicillins     REACTION: rash  . Rabeprazole Sodium     REACTION: keeps her awake  . Tegretol [Carbamazepine]     Body aches and cold   Current Outpatient Prescriptions on File Prior to Visit  Medication Sig Dispense Refill  . albuterol (PROAIR HFA) 108 (90 BASE) MCG/ACT inhaler Inhale 2 puffs into the lungs every 4 (four) hours as needed for wheezing.  1 Inhaler  11  . aspirin 81 MG chewable tablet Chew 81 mg by mouth daily.        Marland Kitchen buPROPion (WELLBUTRIN XL) 150 MG 24 hr tablet Take 150 mg by mouth daily.        . busPIRone (BUSPAR) 15 MG tablet Take 15 mg by mouth 2 (two) times daily before a meal.        . clobetasol ointment (TEMOVATE) 0.05 % Apply to affected area  daily for 2 weeks, then every other day  30 g  2  . cyanocobalamin 2000 MCG tablet Take 2,000 mcg by mouth daily.      . cyclobenzaprine (FLEXERIL) 10 MG tablet ONE TABLET BY MOUTH UP TO 3 TIMES DAILY AS NEEDED FOR muscle spasms  90 tablet  1  . divalproex (DEPAKOTE) 500 MG 24 hr tablet Take 500 mg by mouth daily.        Marland Kitchen estrogens, conjugated, (PREMARIN) 0.625 MG tablet Take 1 tablet (0.625 mg total) by mouth daily. Take daily for 21 days then do not take for 7 days.  31 tablet  13  . fluticasone (FLONASE) 50 MCG/ACT nasal spray Place 2 sprays into the nose daily.  16 g  11  . Fluticasone Furoate-Vilanterol (BREO ELLIPTA) 100-25 MCG/INH AEPB Inhale 1 puff into the lungs daily.  1 each  5  . HYDROcodone-acetaminophen (NORCO/VICODIN) 5-325 MG per tablet Take 1 tablet by mouth every 6 (six) hours as needed for pain.  30 tablet  0  . ibuprofen (ADVIL,MOTRIN) 200 MG tablet Take 200 mg by mouth every 8 (eight) hours as needed.        Marland Kitchen ipratropium-albuterol (DUONEB) 0.5-2.5 (3) MG/3ML SOLN Take 3 mLs by nebulization every 6 (six) hours as needed.  360 mL  prn  . levothyroxine (SYNTHROID, LEVOTHROID) 50 MCG tablet TAKE 1 TABLET (50 MCG TOTAL) BY MOUTH DAILY.  30 tablet  5  . LORazepam (ATIVAN) 1 MG tablet Take 1 mg by mouth every 8 (eight) hours as needed.        . mirtazapine (REMERON) 45 MG tablet Take 45 mg by mouth at bedtime.        Marland Kitchen oxyCODONE-acetaminophen (PERCOCET/ROXICET) 5-325 MG per tablet Take 1-2 tablets by mouth every 6 (six) hours as needed for severe pain.      Marland Kitchen scopolamine (TRANSDERM-SCOP) 1.5 MG Place 1 patch (1.5 mg total) onto the skin every 3 (three) days. As needed for motion sickness  10 patch  1  . sertraline (ZOLOFT) 100 MG tablet Take 100 mg by mouth daily.        . simvastatin (ZOCOR) 20 MG tablet TAKE 1 TABLET (20 MG TOTAL) BY MOUTH AT BEDTIME.  30 tablet  5   No current facility-administered medications on file prior to visit.    Review of Systems Review of Systems    Constitutional: Negative for fever, appetite change,  and unexpected weight change.  ENt pos for congestion and rhinorrhea  Eyes: Negative for pain and visual disturbance.  Respiratory: Negative for  shortness of breath.   Cardiovascular: Negative for cp or palpitations    Gastrointestinal: Negative for nausea, diarrhea and constipation.  Genitourinary: Negative for urgency and frequency.  Skin: Negative for pallor or rash   Neurological: Negative for weakness, light-headedness, numbness and headaches.  Hematological: Negative for adenopathy. Does not bruise/bleed easily.  Psychiatric/Behavioral: Negative for dysphoric mood. The patient is not nervous/anxious.         Objective:   Physical Exam  Nursing note and vitals reviewed. Constitutional: She appears well-developed and well-nourished. No distress.  HENT:  Head: Normocephalic and atraumatic.  Right Ear: External ear normal.  Left Ear: External ear normal.  Mouth/Throat: Oropharynx is clear and moist. No oropharyngeal exudate.  Nares are injected and congested  No sinus tenderness Clear rhinorrhea   Eyes: Conjunctivae and EOM are normal. Pupils are equal, round, and reactive to light. Right eye exhibits no discharge. Left eye exhibits no discharge.  Neck: Normal range of motion. Neck supple.  Cardiovascular: Regular rhythm and normal heart sounds.   Pulmonary/Chest: Effort normal. No respiratory distress. She has wheezes. She has no rales.  Diffusely distant bs Scant wheeze on forced exp only   Lymphadenopathy:    She has no cervical adenopathy.  Skin: Skin is warm and dry. No rash noted.  Psychiatric: She has a normal mood and affect.          Assessment & Plan:

## 2013-02-23 NOTE — Assessment & Plan Note (Signed)
In high risk pt -smoker with copd Cover with 5 d of tamiflu Disc symptomatic care - see instructions on AVS  Update if not starting to improve in a week or if worsening  -esp if wheeze or sob

## 2013-02-23 NOTE — Patient Instructions (Signed)
I think you have the flu - even though you have a negative flu test  Fluids/ rest / symptomatic care  Take tamiflu as directed  Ibuprofen as needed for fever (with food) - and tylenol if needed  If wheezing worsens please let me know   I'm glad you are thinking about quitting smoking

## 2013-03-16 ENCOUNTER — Encounter (HOSPITAL_COMMUNITY): Payer: Self-pay

## 2013-03-16 ENCOUNTER — Encounter (HOSPITAL_COMMUNITY)
Admission: RE | Admit: 2013-03-16 | Discharge: 2013-03-16 | Disposition: A | Discharge status: Home / Self Care | Payer: Medicare Other | Source: Ambulatory Visit | Attending: Orthopedic Surgery | Admitting: Orthopedic Surgery

## 2013-03-16 ENCOUNTER — Ambulatory Visit (HOSPITAL_COMMUNITY)
Admission: RE | Admit: 2013-03-16 | Discharge: 2013-03-16 | Disposition: A | Discharge status: Home / Self Care | Payer: Medicare Other | Source: Ambulatory Visit | Attending: Orthopedic Surgery | Admitting: Orthopedic Surgery

## 2013-03-16 DIAGNOSIS — Z01810 Encounter for preprocedural cardiovascular examination: Secondary | ICD-10-CM | POA: Insufficient documentation

## 2013-03-16 DIAGNOSIS — Z01818 Encounter for other preprocedural examination: Secondary | ICD-10-CM | POA: Insufficient documentation

## 2013-03-16 DIAGNOSIS — Z01812 Encounter for preprocedural laboratory examination: Secondary | ICD-10-CM | POA: Insufficient documentation

## 2013-03-16 HISTORY — DX: Hypothyroidism, unspecified: E03.9

## 2013-03-16 HISTORY — DX: Anemia, unspecified: D64.9

## 2013-03-16 HISTORY — DX: Shortness of breath: R06.02

## 2013-03-16 HISTORY — DX: Personal history of other diseases of the digestive system: Z87.19

## 2013-03-16 HISTORY — DX: Other specified postprocedural states: Z98.890

## 2013-03-16 HISTORY — DX: Fibromyalgia: M79.7

## 2013-03-16 HISTORY — DX: Nausea with vomiting, unspecified: R11.2

## 2013-03-16 LAB — PROTIME-INR
INR: 0.87 (ref 0.00–1.49)
PROTHROMBIN TIME: 11.7 s (ref 11.6–15.2)

## 2013-03-16 LAB — COMPREHENSIVE METABOLIC PANEL
ALK PHOS: 57 U/L (ref 39–117)
ALT: 13 U/L (ref 0–35)
AST: 17 U/L (ref 0–37)
Albumin: 3.5 g/dL (ref 3.5–5.2)
BUN: 19 mg/dL (ref 6–23)
CO2: 26 meq/L (ref 19–32)
Calcium: 9.2 mg/dL (ref 8.4–10.5)
Chloride: 104 mEq/L (ref 96–112)
Creatinine, Ser: 1.08 mg/dL (ref 0.50–1.10)
GFR, EST AFRICAN AMERICAN: 62 mL/min — AB (ref >90)
GFR, EST NON AFRICAN AMERICAN: 54 mL/min — AB (ref >90)
GLUCOSE: 87 mg/dL (ref 70–99)
Potassium: 4.8 mEq/L (ref 3.7–5.3)
Sodium: 142 mEq/L (ref 137–147)
Total Bilirubin: 0.3 mg/dL (ref 0.3–1.2)
Total Protein: 6.8 g/dL (ref 6.0–8.3)

## 2013-03-16 LAB — CBC
HEMATOCRIT: 37.1 % (ref 36.0–46.0)
Hemoglobin: 12.3 g/dL (ref 12.0–15.0)
MCH: 31.9 pg (ref 26.0–34.0)
MCHC: 33.2 g/dL (ref 30.0–36.0)
MCV: 96.4 fL (ref 78.0–100.0)
Platelets: 207 10*3/uL (ref 150–400)
RBC: 3.85 MIL/uL — AB (ref 3.87–5.11)
RDW: 14.4 % (ref 11.5–15.5)
WBC: 6.7 10*3/uL (ref 4.0–10.5)

## 2013-03-16 LAB — TYPE AND SCREEN
ABO/RH(D): A POS
Antibody Screen: NEGATIVE

## 2013-03-16 LAB — SURGICAL PCR SCREEN
MRSA, PCR: NEGATIVE
STAPHYLOCOCCUS AUREUS: NEGATIVE

## 2013-03-16 LAB — APTT: APTT: 25 s (ref 24–37)

## 2013-03-16 NOTE — Pre-Procedure Instructions (Addendum)
Lori Orozco  03/16/2013   Your procedure is scheduled on:  friday, January 23.  Report to [facility], Main Entrance [name] at 10:15AM.  Call this number if you have problems the morning of surgery: [phone]   Remember:   Do not eat food or drink liquids after midnight.   Take these medicines the morning of surgery with A SIP OF WATER: buPROPion (WELLBUTRIN XL, sertraline (ZOLOFT) buspar.   Use inhalers,nebulizer if needed and bring Albuterol inhaler to the hospital with you.  Take if needed :oxyCODONE-acetaminophen (PERCOCET/ROXICET), , LORazepam (ATIVAN).             STOP all herbel meds, nsaids (aleve,naproxen,advil,ibuprofen) 5 days prior to surgery including aspirin,ibuprofen   Do not wear jewelry, make-up or nail polish.  Do not wear lotions, powders, or perfumes. You may wear deodorant.  Do not shave 48 hours prior to surgery.   Do not bring valuables to the hospital.  [facility] is not responsible for any belongings or valuables.               Contacts, dentures or bridgework may not be worn into surgery.  Leave suitcase in the car. After surgery it may be brought to your room.  For patients admitted to the hospital, discharge time is determined by your  treatment team.                 Special Instructions: Shower using CHG 2 nights before surgery and the night before surgery.  If you shower the day of surgery use CHG.  Use special wash - you have one bottle of CHG for all showers.  You should use approximately 1/3 of the bottle for each shower.   Please read over the following fact sheets that you were given: Pain Booklet, Coughing and Deep Breathing and Surgical Site Infection Prevention

## 2013-03-17 LAB — ABO/RH: ABO/RH(D): A POS

## 2013-03-19 MED ORDER — CLINDAMYCIN PHOSPHATE 900 MG/50ML IV SOLN
900.0000 mg | INTRAVENOUS | Status: AC
  Administered 2013-03-20: 900 mg via INTRAVENOUS
  Filled 2013-03-19 (×2): qty 50

## 2013-03-20 ENCOUNTER — Inpatient Hospital Stay (HOSPITAL_COMMUNITY)
Admission: RE | Admit: 2013-03-20 | Discharge: 2013-03-23 | DRG: 470 | Disposition: A | Discharge status: Skilled Nursing Facility | Payer: Medicare Other | Source: Ambulatory Visit | Attending: Orthopedic Surgery | Admitting: Orthopedic Surgery

## 2013-03-20 ENCOUNTER — Inpatient Hospital Stay (HOSPITAL_COMMUNITY): Payer: Medicare Other

## 2013-03-20 ENCOUNTER — Encounter (HOSPITAL_COMMUNITY): Payer: Medicare Other | Admitting: Critical Care Medicine

## 2013-03-20 ENCOUNTER — Encounter (HOSPITAL_COMMUNITY)
Admission: RE | Disposition: A | Discharge status: Skilled Nursing Facility | Payer: Self-pay | Source: Ambulatory Visit | Attending: Orthopedic Surgery

## 2013-03-20 ENCOUNTER — Encounter (HOSPITAL_COMMUNITY): Payer: Self-pay | Admitting: *Deleted

## 2013-03-20 ENCOUNTER — Inpatient Hospital Stay (HOSPITAL_COMMUNITY): Payer: Medicare Other | Admitting: Critical Care Medicine

## 2013-03-20 DIAGNOSIS — F411 Generalized anxiety disorder: Secondary | ICD-10-CM | POA: Diagnosis present

## 2013-03-20 DIAGNOSIS — D62 Acute posthemorrhagic anemia: Secondary | ICD-10-CM | POA: Diagnosis not present

## 2013-03-20 DIAGNOSIS — Z8249 Family history of ischemic heart disease and other diseases of the circulatory system: Secondary | ICD-10-CM

## 2013-03-20 DIAGNOSIS — Z880 Allergy status to penicillin: Secondary | ICD-10-CM

## 2013-03-20 DIAGNOSIS — K589 Irritable bowel syndrome without diarrhea: Secondary | ICD-10-CM | POA: Diagnosis present

## 2013-03-20 DIAGNOSIS — M169 Osteoarthritis of hip, unspecified: Principal | ICD-10-CM | POA: Diagnosis present

## 2013-03-20 DIAGNOSIS — E785 Hyperlipidemia, unspecified: Secondary | ICD-10-CM | POA: Diagnosis present

## 2013-03-20 DIAGNOSIS — G9332 Myalgic encephalomyelitis/chronic fatigue syndrome: Secondary | ICD-10-CM | POA: Diagnosis present

## 2013-03-20 DIAGNOSIS — R5382 Chronic fatigue, unspecified: Secondary | ICD-10-CM | POA: Diagnosis present

## 2013-03-20 DIAGNOSIS — F319 Bipolar disorder, unspecified: Secondary | ICD-10-CM | POA: Diagnosis present

## 2013-03-20 DIAGNOSIS — K219 Gastro-esophageal reflux disease without esophagitis: Secondary | ICD-10-CM | POA: Diagnosis present

## 2013-03-20 DIAGNOSIS — Z881 Allergy status to other antibiotic agents status: Secondary | ICD-10-CM

## 2013-03-20 DIAGNOSIS — F609 Personality disorder, unspecified: Secondary | ICD-10-CM | POA: Diagnosis present

## 2013-03-20 DIAGNOSIS — Z800 Family history of malignant neoplasm of digestive organs: Secondary | ICD-10-CM

## 2013-03-20 DIAGNOSIS — E039 Hypothyroidism, unspecified: Secondary | ICD-10-CM | POA: Diagnosis present

## 2013-03-20 DIAGNOSIS — M87059 Idiopathic aseptic necrosis of unspecified femur: Secondary | ICD-10-CM | POA: Diagnosis present

## 2013-03-20 DIAGNOSIS — Z96649 Presence of unspecified artificial hip joint: Secondary | ICD-10-CM

## 2013-03-20 DIAGNOSIS — E7800 Pure hypercholesterolemia, unspecified: Secondary | ICD-10-CM | POA: Diagnosis present

## 2013-03-20 DIAGNOSIS — Z841 Family history of disorders of kidney and ureter: Secondary | ICD-10-CM

## 2013-03-20 DIAGNOSIS — J4489 Other specified chronic obstructive pulmonary disease: Secondary | ICD-10-CM | POA: Diagnosis present

## 2013-03-20 DIAGNOSIS — J449 Chronic obstructive pulmonary disease, unspecified: Secondary | ICD-10-CM | POA: Diagnosis present

## 2013-03-20 DIAGNOSIS — F17200 Nicotine dependence, unspecified, uncomplicated: Secondary | ICD-10-CM | POA: Diagnosis present

## 2013-03-20 DIAGNOSIS — Z79899 Other long term (current) drug therapy: Secondary | ICD-10-CM

## 2013-03-20 DIAGNOSIS — Z888 Allergy status to other drugs, medicaments and biological substances status: Secondary | ICD-10-CM

## 2013-03-20 DIAGNOSIS — M1610 Unilateral primary osteoarthritis, unspecified hip: Principal | ICD-10-CM | POA: Diagnosis present

## 2013-03-20 DIAGNOSIS — R110 Nausea: Secondary | ICD-10-CM | POA: Diagnosis not present

## 2013-03-20 DIAGNOSIS — Z7982 Long term (current) use of aspirin: Secondary | ICD-10-CM

## 2013-03-20 HISTORY — PX: TOTAL HIP ARTHROPLASTY: SHX124

## 2013-03-20 SURGERY — ARTHROPLASTY, HIP, TOTAL,POSTERIOR APPROACH
Anesthesia: General | Site: Hip | Laterality: Right

## 2013-03-20 MED ORDER — DIVALPROEX SODIUM ER 500 MG PO TB24
500.0000 mg | ORAL_TABLET | Freq: Every day | ORAL | Status: DC
  Administered 2013-03-20 – 2013-03-23 (×4): 500 mg via ORAL
  Filled 2013-03-20 (×4): qty 1

## 2013-03-20 MED ORDER — BUPROPION HCL ER (XL) 150 MG PO TB24
150.0000 mg | ORAL_TABLET | Freq: Every day | ORAL | Status: DC
  Administered 2013-03-20 – 2013-03-23 (×4): 150 mg via ORAL
  Filled 2013-03-20 (×4): qty 1

## 2013-03-20 MED ORDER — PROPOFOL 10 MG/ML IV BOLUS
INTRAVENOUS | Status: AC
  Filled 2013-03-20: qty 20

## 2013-03-20 MED ORDER — CEFAZOLIN SODIUM 1-5 GM-% IV SOLN: 1.0000 g | Freq: Four times a day (QID) | INTRAVENOUS | Status: DC

## 2013-03-20 MED ORDER — CLINDAMYCIN PHOSPHATE 600 MG/50ML IV SOLN
600.0000 mg | Freq: Four times a day (QID) | INTRAVENOUS | Status: AC
  Administered 2013-03-20: 600 mg via INTRAVENOUS
  Filled 2013-03-20 (×2): qty 50

## 2013-03-20 MED ORDER — ASPIRIN EC 325 MG PO TBEC
325.0000 mg | DELAYED_RELEASE_TABLET | Freq: Every day | ORAL | Status: DC
  Administered 2013-03-21 – 2013-03-23 (×3): 325 mg via ORAL
  Filled 2013-03-20 (×5): qty 1

## 2013-03-20 MED ORDER — METOCLOPRAMIDE HCL 5 MG/ML IJ SOLN
5.0000 mg | Freq: Three times a day (TID) | INTRAMUSCULAR | Status: DC | PRN
  Administered 2013-03-20: 10 mg via INTRAVENOUS
  Filled 2013-03-20: qty 2

## 2013-03-20 MED ORDER — BUSPIRONE HCL 15 MG PO TABS
15.0000 mg | ORAL_TABLET | Freq: Two times a day (BID) | ORAL | Status: DC
  Administered 2013-03-20 – 2013-03-23 (×6): 15 mg via ORAL
  Filled 2013-03-20 (×9): qty 1

## 2013-03-20 MED ORDER — LORAZEPAM 1 MG PO TABS
1.0000 mg | ORAL_TABLET | Freq: Three times a day (TID) | ORAL | Status: DC | PRN
  Administered 2013-03-21 – 2013-03-22 (×2): 1 mg via ORAL
  Filled 2013-03-20 (×2): qty 1

## 2013-03-20 MED ORDER — PROPOFOL INFUSION 10 MG/ML OPTIME
INTRAVENOUS | Status: DC | PRN
  Administered 2013-03-20: 50 ug/kg/min via INTRAVENOUS

## 2013-03-20 MED ORDER — LEVOTHYROXINE SODIUM 50 MCG PO TABS
50.0000 ug | ORAL_TABLET | Freq: Every day | ORAL | Status: DC
  Administered 2013-03-21 – 2013-03-23 (×3): 50 ug via ORAL
  Filled 2013-03-20 (×4): qty 1

## 2013-03-20 MED ORDER — ACETAMINOPHEN 650 MG RE SUPP: 650.0000 mg | Freq: Four times a day (QID) | RECTAL | Status: DC | PRN

## 2013-03-20 MED ORDER — ALBUTEROL SULFATE (2.5 MG/3ML) 0.083% IN NEBU: 2.5000 mg | INHALATION_SOLUTION | RESPIRATORY_TRACT | Status: DC | PRN

## 2013-03-20 MED ORDER — ALBUTEROL SULFATE HFA 108 (90 BASE) MCG/ACT IN AERS: 2.0000 | INHALATION_SPRAY | RESPIRATORY_TRACT | Status: DC | PRN

## 2013-03-20 MED ORDER — MIRTAZAPINE 45 MG PO TABS
45.0000 mg | ORAL_TABLET | Freq: Every day | ORAL | Status: DC
  Administered 2013-03-20 – 2013-03-22 (×3): 45 mg via ORAL
  Filled 2013-03-20 (×5): qty 1

## 2013-03-20 MED ORDER — IPRATROPIUM-ALBUTEROL 0.5-2.5 (3) MG/3ML IN SOLN: 3.0000 mL | Freq: Four times a day (QID) | RESPIRATORY_TRACT | Status: DC | PRN

## 2013-03-20 MED ORDER — ESTROGENS CONJUGATED 0.625 MG PO TABS: 0.6250 mg | ORAL_TABLET | Freq: Every day | ORAL | Status: DC

## 2013-03-20 MED ORDER — SODIUM CHLORIDE 0.9 % IV SOLN
INTRAVENOUS | Status: DC
  Administered 2013-03-20: 23:00:00 via INTRAVENOUS

## 2013-03-20 MED ORDER — FLUTICASONE FUROATE-VILANTEROL 100-25 MCG/INH IN AEPB: 1.0000 | INHALATION_SPRAY | Freq: Every day | RESPIRATORY_TRACT | Status: DC

## 2013-03-20 MED ORDER — MIDAZOLAM HCL 2 MG/2ML IJ SOLN
INTRAMUSCULAR | Status: AC
  Filled 2013-03-20: qty 2

## 2013-03-20 MED ORDER — BUPIVACAINE HCL (PF) 0.5 % IJ SOLN
INTRAMUSCULAR | Status: DC | PRN
  Administered 2013-03-20: 3 mL

## 2013-03-20 MED ORDER — ONDANSETRON HCL 4 MG PO TABS
4.0000 mg | ORAL_TABLET | Freq: Four times a day (QID) | ORAL | Status: DC | PRN
  Administered 2013-03-21: 4 mg via ORAL
  Filled 2013-03-20: qty 1

## 2013-03-20 MED ORDER — METOCLOPRAMIDE HCL 10 MG PO TABS
5.0000 mg | ORAL_TABLET | Freq: Three times a day (TID) | ORAL | Status: DC | PRN
  Administered 2013-03-21 (×3): 10 mg via ORAL
  Filled 2013-03-20 (×3): qty 1

## 2013-03-20 MED ORDER — HYDROMORPHONE HCL PF 1 MG/ML IJ SOLN
1.0000 mg | INTRAMUSCULAR | Status: DC | PRN
  Administered 2013-03-20 – 2013-03-21 (×3): 1 mg via INTRAVENOUS
  Filled 2013-03-20 (×4): qty 1

## 2013-03-20 MED ORDER — PROMETHAZINE HCL 25 MG/ML IJ SOLN: 6.2500 mg | INTRAMUSCULAR | Status: DC | PRN

## 2013-03-20 MED ORDER — OXYCODONE HCL 5 MG PO TABS
5.0000 mg | ORAL_TABLET | ORAL | Status: DC | PRN
  Administered 2013-03-21 (×4): 10 mg via ORAL
  Filled 2013-03-20 (×5): qty 2

## 2013-03-20 MED ORDER — SERTRALINE HCL 100 MG PO TABS
100.0000 mg | ORAL_TABLET | Freq: Every day | ORAL | Status: DC
  Administered 2013-03-20 – 2013-03-23 (×4): 100 mg via ORAL
  Filled 2013-03-20 (×4): qty 1

## 2013-03-20 MED ORDER — LACTATED RINGERS IV SOLN
INTRAVENOUS | Status: DC
  Administered 2013-03-20: 13:00:00 via INTRAVENOUS
  Administered 2013-03-20: 50 mL/h via INTRAVENOUS

## 2013-03-20 MED ORDER — SIMVASTATIN 20 MG PO TABS
20.0000 mg | ORAL_TABLET | Freq: Every day | ORAL | Status: DC
  Administered 2013-03-20 – 2013-03-22 (×3): 20 mg via ORAL
  Filled 2013-03-20 (×4): qty 1

## 2013-03-20 MED ORDER — OXYCODONE HCL 5 MG/5ML PO SOLN: 5.0000 mg | Freq: Once | ORAL | Status: DC | PRN

## 2013-03-20 MED ORDER — MIDAZOLAM HCL 5 MG/5ML IJ SOLN
INTRAMUSCULAR | Status: DC | PRN
  Administered 2013-03-20: 2 mg via INTRAVENOUS

## 2013-03-20 MED ORDER — MENTHOL 3 MG MT LOZG
1.0000 | LOZENGE | OROMUCOSAL | Status: DC | PRN
Start: 2013-03-20 — End: 2013-03-23

## 2013-03-20 MED ORDER — PHENOL 1.4 % MT LIQD: 1.0000 | OROMUCOSAL | Status: DC | PRN

## 2013-03-20 MED ORDER — BUDESONIDE-FORMOTEROL FUMARATE 160-4.5 MCG/ACT IN AERO
2.0000 | INHALATION_SPRAY | Freq: Two times a day (BID) | RESPIRATORY_TRACT | Status: DC
  Administered 2013-03-21 – 2013-03-23 (×5): 2 via RESPIRATORY_TRACT
  Filled 2013-03-20: qty 6

## 2013-03-20 MED ORDER — ACETAMINOPHEN 325 MG PO TABS: 325.0000 mg | ORAL_TABLET | ORAL | Status: DC | PRN

## 2013-03-20 MED ORDER — METHOCARBAMOL 100 MG/ML IJ SOLN: 500.0000 mg | Freq: Four times a day (QID) | INTRAVENOUS | Status: DC | PRN

## 2013-03-20 MED ORDER — OXYCODONE HCL 5 MG PO TABS: 5.0000 mg | ORAL_TABLET | Freq: Once | ORAL | Status: DC | PRN

## 2013-03-20 MED ORDER — FERROUS SULFATE 325 (65 FE) MG PO TABS
325.0000 mg | ORAL_TABLET | Freq: Three times a day (TID) | ORAL | Status: DC
  Administered 2013-03-20 – 2013-03-23 (×8): 325 mg via ORAL
  Filled 2013-03-20 (×11): qty 1

## 2013-03-20 MED ORDER — ALUM & MAG HYDROXIDE-SIMETH 200-200-20 MG/5ML PO SUSP: 30.0000 mL | ORAL | Status: DC | PRN

## 2013-03-20 MED ORDER — KETOROLAC TROMETHAMINE 30 MG/ML IJ SOLN
INTRAMUSCULAR | Status: AC
  Filled 2013-03-20: qty 1

## 2013-03-20 MED ORDER — FENTANYL CITRATE 0.05 MG/ML IJ SOLN
INTRAMUSCULAR | Status: AC
  Filled 2013-03-20: qty 5

## 2013-03-20 MED ORDER — ACETAMINOPHEN 325 MG PO TABS
650.0000 mg | ORAL_TABLET | Freq: Four times a day (QID) | ORAL | Status: DC | PRN
Start: 2013-03-20 — End: 2013-03-23

## 2013-03-20 MED ORDER — SCOPOLAMINE 1 MG/3DAYS TD PT72
1.0000 | MEDICATED_PATCH | TRANSDERMAL | Status: DC
  Administered 2013-03-20: 1.5 mg via TRANSDERMAL
  Filled 2013-03-20 (×2): qty 1

## 2013-03-20 MED ORDER — FENTANYL CITRATE 0.05 MG/ML IJ SOLN
INTRAMUSCULAR | Status: DC | PRN
  Administered 2013-03-20: 100 ug via INTRAVENOUS

## 2013-03-20 MED ORDER — KETOROLAC TROMETHAMINE 30 MG/ML IJ SOLN
15.0000 mg | Freq: Once | INTRAMUSCULAR | Status: AC | PRN
  Administered 2013-03-20: 30 mg via INTRAVENOUS

## 2013-03-20 MED ORDER — HYDROMORPHONE HCL PF 1 MG/ML IJ SOLN
0.2500 mg | INTRAMUSCULAR | Status: DC | PRN
  Administered 2013-03-20 (×2): 0.5 mg via INTRAVENOUS

## 2013-03-20 MED ORDER — METHOCARBAMOL 500 MG PO TABS
500.0000 mg | ORAL_TABLET | Freq: Four times a day (QID) | ORAL | Status: DC | PRN
  Administered 2013-03-20 – 2013-03-21 (×2): 500 mg via ORAL
  Filled 2013-03-20 (×3): qty 1

## 2013-03-20 MED ORDER — ONDANSETRON HCL 4 MG/2ML IJ SOLN
4.0000 mg | Freq: Four times a day (QID) | INTRAMUSCULAR | Status: DC | PRN
  Administered 2013-03-20 (×2): 4 mg via INTRAVENOUS
  Filled 2013-03-20 (×2): qty 2

## 2013-03-20 MED ORDER — DIPHENHYDRAMINE HCL 12.5 MG/5ML PO ELIX: 12.5000 mg | ORAL_SOLUTION | ORAL | Status: DC | PRN

## 2013-03-20 MED ORDER — SODIUM CHLORIDE 0.9 % IR SOLN
Status: DC | PRN
  Administered 2013-03-20: 1000 mL

## 2013-03-20 MED ORDER — CYCLOBENZAPRINE HCL 10 MG PO TABS
10.0000 mg | ORAL_TABLET | Freq: Three times a day (TID) | ORAL | Status: DC | PRN
  Administered 2013-03-21: 10 mg via ORAL
  Filled 2013-03-20: qty 1

## 2013-03-20 MED ORDER — LIDOCAINE HCL (CARDIAC) 20 MG/ML IV SOLN
INTRAVENOUS | Status: AC
  Filled 2013-03-20: qty 5

## 2013-03-20 MED ORDER — FLUTICASONE PROPIONATE 50 MCG/ACT NA SUSP
2.0000 | Freq: Every day | NASAL | Status: DC
  Administered 2013-03-21 – 2013-03-23 (×3): 2 via NASAL
  Filled 2013-03-20: qty 16

## 2013-03-20 MED ORDER — HYDROMORPHONE HCL PF 1 MG/ML IJ SOLN
INTRAMUSCULAR | Status: AC
  Filled 2013-03-20: qty 1

## 2013-03-20 MED ORDER — ACETAMINOPHEN 160 MG/5ML PO SOLN
325.0000 mg | ORAL | Status: DC | PRN
  Filled 2013-03-20: qty 20.3

## 2013-03-20 MED ORDER — PHENYLEPHRINE HCL 10 MG/ML IJ SOLN
INTRAMUSCULAR | Status: DC | PRN
  Administered 2013-03-20 (×5): 80 ug via INTRAVENOUS

## 2013-03-20 SURGICAL SUPPLY — 47 items
BLADE SAW SAG 73X25 THK (BLADE) ×2
BLADE SAW SGTL 73X25 THK (BLADE) ×1 IMPLANT
BLADE SURG 10 STRL SS (BLADE) IMPLANT
BLADE SURG 21 STRL SS (BLADE) ×3 IMPLANT
BRUSH FEMORAL CANAL (MISCELLANEOUS) IMPLANT
CAP POROUS W/METAL ON POLY ×2 IMPLANT
CLOTH BEACON ORANGE TIMEOUT ST (SAFETY) ×3 IMPLANT
COVER BACK TABLE 24X17X13 BIG (DRAPES) IMPLANT
COVER SURGICAL LIGHT HANDLE (MISCELLANEOUS) ×3 IMPLANT
DRAPE INCISE IOBAN 85X60 (DRAPES) ×3 IMPLANT
DRAPE ORTHO SPLIT 77X108 STRL (DRAPES) ×6
DRAPE SURG ORHT 6 SPLT 77X108 (DRAPES) ×2 IMPLANT
DRAPE U-SHAPE 47X51 STRL (DRAPES) ×3 IMPLANT
DRSG MEPILEX BORDER 4X12 (GAUZE/BANDAGES/DRESSINGS) ×2 IMPLANT
DRSG MEPILEX BORDER 4X8 (GAUZE/BANDAGES/DRESSINGS) IMPLANT
DURAPREP 26ML APPLICATOR (WOUND CARE) ×3 IMPLANT
ELECT BLADE 6.5 EXT (BLADE) IMPLANT
ELECT CAUTERY BLADE 6.4 (BLADE) ×3 IMPLANT
ELECT REM PT RETURN 9FT ADLT (ELECTROSURGICAL) ×3
ELECTRODE REM PT RTRN 9FT ADLT (ELECTROSURGICAL) ×1 IMPLANT
GLOVE BIOGEL PI IND STRL 9 (GLOVE) ×1 IMPLANT
GLOVE BIOGEL PI INDICATOR 9 (GLOVE) ×2
GLOVE SURG ORTHO 9.0 STRL STRW (GLOVE) ×3 IMPLANT
GOWN PREVENTION PLUS XLARGE (GOWN DISPOSABLE) ×3 IMPLANT
GOWN SRG XL XLNG 56XLVL 4 (GOWN DISPOSABLE) ×1 IMPLANT
GOWN STRL NON-REIN XL XLG LVL4 (GOWN DISPOSABLE) ×3
HANDPIECE INTERPULSE COAX TIP (DISPOSABLE)
KIT BASIN OR (CUSTOM PROCEDURE TRAY) ×3 IMPLANT
KIT ROOM TURNOVER OR (KITS) ×3 IMPLANT
MANIFOLD NEPTUNE II (INSTRUMENTS) ×3 IMPLANT
NS IRRIG 1000ML POUR BTL (IV SOLUTION) ×3 IMPLANT
PACK TOTAL JOINT (CUSTOM PROCEDURE TRAY) ×3 IMPLANT
PAD ARMBOARD 7.5X6 YLW CONV (MISCELLANEOUS) ×6 IMPLANT
PRESSURIZER FEMORAL UNIV (MISCELLANEOUS) IMPLANT
SET HNDPC FAN SPRY TIP SCT (DISPOSABLE) IMPLANT
STAPLER VISISTAT 35W (STAPLE) ×3 IMPLANT
SUT ETHIBOND NAB CT1 #1 30IN (SUTURE) ×5 IMPLANT
SUT VIC AB 0 CT1 27 (SUTURE) ×6
SUT VIC AB 0 CT1 27XBRD ANBCTR (SUTURE) ×2 IMPLANT
SUT VIC AB 1 CTX 36 (SUTURE) ×3
SUT VIC AB 1 CTX36XBRD ANBCTR (SUTURE) ×1 IMPLANT
SUT VIC AB 2-0 CTB1 (SUTURE) ×6 IMPLANT
TOWEL OR 17X24 6PK STRL BLUE (TOWEL DISPOSABLE) ×3 IMPLANT
TOWEL OR 17X26 10 PK STRL BLUE (TOWEL DISPOSABLE) ×3 IMPLANT
TOWER CARTRIDGE SMART MIX (DISPOSABLE) IMPLANT
TRAY FOLEY CATH 16FRSI W/METER (SET/KITS/TRAYS/PACK) IMPLANT
WATER STERILE IRR 1000ML POUR (IV SOLUTION) ×9 IMPLANT

## 2013-03-20 NOTE — Anesthesia Postprocedure Evaluation (Signed)
  Anesthesia Post-op Note  Patient: Lori Orozco  Procedure(s) Performed: Procedure(s) with comments: Right TOTAL HIP ARTHROPLASTY (Right) - Right Total Hip Arthroplasty  Patient Location: PACU  Anesthesia Type:Spinal  Level of Consciousness: awake, alert  and oriented  Airway and Oxygen Therapy: Patient Spontanous Breathing  Post-op Pain: mild  Post-op Assessment: Post-op Vital signs reviewed, Patient's Cardiovascular Status Stable, Respiratory Function Stable, Patent Airway, No signs of Nausea or vomiting and Pain level controlled  Post-op Vital Signs: Reviewed and stable  Complications: No apparent anesthesia complications

## 2013-03-20 NOTE — Progress Notes (Signed)
Anticipate patient will require discharge to skilled nursing facility. Patient may be discharged on Monday.

## 2013-03-20 NOTE — Transfer of Care (Signed)
Immediate Anesthesia Transfer of Care Note  Patient: Lori Orozco  Procedure(s) Performed: Procedure(s) with comments: Right TOTAL HIP ARTHROPLASTY (Right) - Right Total Hip Arthroplasty  Patient Location: PACU  Anesthesia Type:spinal  Level of Consciousness: awake, alert  and oriented  Airway & Oxygen Therapy: Patient Spontanous Breathing and Patient connected to nasal cannula oxygen  Post-op Assessment: Report given to PACU RN and Post -op Vital signs reviewed and stable  Post vital signs: Reviewed and stable  Complications: No apparent anesthesia complications

## 2013-03-20 NOTE — Anesthesia Procedure Notes (Addendum)
Spinal  Patient location during procedure: OR Start time: 03/20/2013 12:01 PM End time: 03/20/2013 12:06 PM Staffing Anesthesiologist: MOSER, CHRIS Performed by: anesthesiologist  Preanesthetic Checklist Completed: patient identified, site marked, surgical consent, pre-op evaluation, timeout performed, IV checked, risks and benefits discussed and monitors and equipment checked Spinal Block Patient position: sitting Prep: Betadine Patient monitoring: heart rate, cardiac monitor, continuous pulse ox and blood pressure Approach: midline Location: L4-5 Injection technique: single-shot Needle Needle type: Quincke  Needle gauge: 25 G Needle length: 5 cm  Procedure Name: MAC Date/Time: 03/20/2013 11:59 AM Performed by: Elon Alas Pre-anesthesia Checklist: Patient identified, Timeout performed, Emergency Drugs available, Patient being monitored and Suction available Oxygen Delivery Method: Simple face mask Intubation Type: IV induction Placement Confirmation: positive ETCO2 and breath sounds checked- equal and bilateral Dental Injury: Teeth and Oropharynx as per pre-operative assessment

## 2013-03-20 NOTE — Op Note (Signed)
OPERATIVE REPORT  DATE OF SURGERY: 03/20/2013  PATIENT:  Lori Orozco,  63 y.o. female  PRE-OPERATIVE DIAGNOSIS:  Osteoarthritis Right Hip  POST-OPERATIVE DIAGNOSIS:  Osteoarthritis Right Hip  PROCEDURE:  Procedure(s): Right TOTAL HIP ARTHROPLASTY Zimmer components size 11 stem  modular neck size A. a  36 mm metal head +0  54 mm acetabulum and 36 mm liner  SURGEON:  Surgeon(s): [name], MD  ANESTHESIA:   spinal  EBL:  min ML  SPECIMEN:  No Specimen  TOURNIQUET:  * No tourniquets in log *  PROCEDURE DETAILS: Patient is a 63 year old woman with osteoarthritis of her right hip. She has failed conservative care has pain with activities of daily living and presents at this time for total hip arthroplasty. Risks and benefits were discussed including infection neurovascular injury pain DVT need for additional surgery. Patient states she understands and wishes to proceed at this time. Description of procedure patient was brought to the operating room and underwent a spinal anesthetic. After adequate levels of anesthesia were obtained patient was placed on her left lateral decubitus position with the right side up and the right lower extremity was prepped using DuraPrep draped in a sterile field an [place] was used to cover all exposed skin. A posterior lateral incision was made this was carried down to the tensor fascia lata which was split. The piriformis short external rotators and capsule were incised off the femoral neck retracted with Ethibond suture. The hip was dislocated and the femoral neck cut was made 1 cm proximal to the calcar. The acetabulum was sequentially reamed to 54 mm and a 54 mm acetabulum was placed 45 of abduction and 20 of anteversion. The 0 liner was placed. Attention was then focused on the femur the femur was sequentially broached up to a size 11 the size 11 femoral stem was placed. The neck with the anteversion was sequentially tried different lengths and  patient had good stable leg length with a -4 modular stem anteverted. The final stem and head were placed the hip was placed a full range of motion. Patient had full flexion to 110 internal rotation of 90 and the hip was stable with full abduction. Leg lengths clinically were equal. The wound is irrigated with normal saline the piriformis short external rotators and capsule were reapproximated using the Ethibond suture. The tensor fascia lata was closed using #1 Vicryl. Subcutaneous is closed using 0 Vicryl. Skin was closed using staples. A Mepilex dressing was applied. Patient was taken to the PACU in stable condition.  PLAN OF CARE: Admit to inpatient   PATIENT DISPOSITION:  PACU - hemodynamically stable.   [name], MD 03/20/2013 3:59 PM

## 2013-03-20 NOTE — Anesthesia Preprocedure Evaluation (Addendum)
Anesthesia Evaluation  Patient identified by MRN, date of birth, ID band Patient awake    Reviewed: Allergy & Precautions, H&P , NPO status , Patient's Chart, lab work & pertinent test results  History of Anesthesia Complications (+) PONV and history of anesthetic complications  Airway Mallampati: III TM Distance: >3 FB Neck ROM: Full    Dental  (+) Dental Advisory Given and Teeth Intact   Pulmonary shortness of breath, COPD COPD inhaler, Current Smoker,          Cardiovascular + Peripheral Vascular Disease     Neuro/Psych  Headaches, PSYCHIATRIC DISORDERS Anxiety Depression  Neuromuscular disease    GI/Hepatic hiatal hernia, GERD-  ,  Endo/Other  Hypothyroidism   Renal/GU      Musculoskeletal  (+) Fibromyalgia -  Abdominal   Peds  Hematology   Anesthesia Other Findings   Reproductive/Obstetrics                        Anesthesia Physical Anesthesia Plan  ASA: III  Anesthesia Plan: Spinal and MAC   Post-op Pain Management:    Induction: Intravenous  Airway Management Planned: Simple Face Mask  Additional Equipment:   Intra-op Plan:   Post-operative Plan:   Informed Consent: I have reviewed the patients History and Physical, chart, labs and discussed the procedure including the risks, benefits and alternatives for the proposed anesthesia with the patient or authorized representative who has indicated his/her understanding and acceptance.   Dental advisory given  Plan Discussed with: Anesthesiologist and Surgeon  Anesthesia Plan Comments:         Anesthesia Quick Evaluation

## 2013-03-20 NOTE — H&P (Signed)
TOTAL HIP ADMISSION H&P  Patient is admitted for right total hip arthroplasty.  Subjective:  Chief Complaint: right hip pain  HPI: Lori Orozco, 63 y.o. female, has a history of pain and functional disability in the right hip(s) due to arthritis and patient has failed non-surgical conservative treatments for greater than 12 weeks to include NSAID's and/or analgesics, use of assistive devices and activity modification.  Onset of symptoms was gradual starting 8 years ago with gradually worsening course since that time.The patient noted no past surgery on the right hip(s).  Patient currently rates pain in the right hip at 8 out of 10 with activity. Patient has night pain, worsening of pain with activity and weight bearing, trendelenberg gait, pain that interfers with activities of daily living and pain with passive range of motion. Patient has evidence of subchondral cysts, subchondral sclerosis, periarticular osteophytes and joint space narrowing by imaging studies. This condition presents safety issues increasing the risk of falls. This patient has had avascular necrosis of the hip, acetabular fracture, hip dysplasia.  There is no current active infection.  Patient Active Problem List   Diagnosis Date Noted  . Influenza 02/23/2013  . 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 Encounter for Medicare annual wellness exam 11/18/2012  . Acute bronchitis with bronchospasm 11/18/2012  . History of falling 11/18/2012  . Routine general medical examination at a health care facility 11/10/2012  . 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 COPD exacerbation 11/07/2011  . Osteopenia 10/19/2011  . Other screening mammogram 10/19/2011  . Hypothyroid 10/19/2011  . Lingular pneumonia 08/06/2011  . Acute bronchitis with chronic obstructive pulmonary disease (COPD) 07/17/2011  . TOBACCO USER 03/07/2010  . ANEMIA 10/11/2009  . PERIPHERAL NEUROPATHY, FEET 08/05/2009  .  BACK PAIN 08/05/2009  . HAND PAIN, BILATERAL 08/05/2009  . TREMOR 03/09/2008  . MENOPAUSAL SYNDROME 10/09/2007  . DEPRESSION, MAJOR 05/20/2007  . BIPOLAR AFFECTIVE DISORDER 05/20/2007  . ANXIETY 05/20/2007  . 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 ESOPHAGEAL SPASM 05/20/2007  . HIATAL HERNIA 05/20/2007  . AMAUROSIS FUGAX 05/07/2007  . COPD with chronic bronchitis 03/27/2007  . HYPERCHOLESTEROLEMIA, PURE 12/10/2006  . SYMPTOM, SYNDROME, CHRONIC FATIGUE 12/10/2006   Past Medical History  Diagnosis Date  . COPD (chronic obstructive pulmonary disease)   . Chronic fatigue syndrome   . Hyperlipidemia   . Anxiety   . Depression   . Irritable bowel syndrome   . Migraines   . Amaurosis fugax   . Asthma   . Cataract   . Emphysema of lung   . Thyroid disease     [facility]  . Lumbar herniated disc   . PONV (postoperative nausea and vomiting)   . Hypothyroidism   . Shortness of breath   . Pneumonia   . Chronic kidney disease     frequency, nephritis when 63 yrs old  . GERD (gastroesophageal reflux disease)     occ  . H/O hiatal hernia   . Fibromyalgia   . Anemia     hx    Past Surgical History  Procedure Laterality Date  . Temporomandibular joint surgery    . Abdominal hysterectomy    . Dexa-osteopenia    . Epicondylitis    . Foot surgery Bilateral   . Carotid dopplar    . Doppler echocardiography    . Tonsillectomy    . Knee surgery      Left cartilage  . Trigger finger release  Right   . Colonoscopy  05/26/2010    avms- otherwise nl , re check 10y  . Plantar fascia surgery Right   . Wrist arthroscopy Right     ligament tear  . Eye surgery Bilateral     cataracts    No prescriptions prior to admission   Allergies  Allergen Reactions  . Codeine Nausea Only    Makes pt stay awake  . Cyclosporine     REACTION: caused eyes to severly burn  . Cymbalta [Duloxetine Hcl] Other (See Comments)    Makes pt pass out   . Erythromycin      REACTION: GI  . Lithium Swelling  . Lyrica [Pregabalin]     Felt faint  . Penicillins     REACTION: rash  . Rabeprazole Sodium     REACTION: keeps her awake  . Tegretol [Carbamazepine]     Body aches and cold  . Duraprep [employer], Misc.] Rash    RASH   . Tape Rash    PT ALLERGIC NYLON TAPE     History  Substance Use Topics  . Smoking status: Current Every Day Smoker -- 1.00 packs/day for 43 years    Types: Cigarettes  . Smokeless tobacco: Not on file     Comment: "vaping now"for 3 weeks  . Alcohol Use: No    Family History  Problem Relation Age of Onset  . Thyroid disease Mother   . Hypertension Mother   . Kidney disease Mother   . Cancer Father   . Colon cancer Other      Review of Systems  All other systems reviewed and are negative.    Objective:  Physical Exam  Vital signs in last 24 hours:    Labs:   Estimated body mass index is 27.40 kg/(m^2) as calculated from the following:   Height as of 01/29/13: 5\' 7"  (1.702 m).   Weight as of 01/29/13: 79.379 kg (175 lb).   Imaging Review Plain radiographs demonstrate moderate degenerative joint disease of the right hip(s). The bone quality appears to be adequate for age and reported activity level.  Assessment/Plan:  End stage arthritis, right hip(s)  The patient history, physical examination, clinical judgement of the provider and imaging studies are consistent with end stage degenerative joint disease of the right hip(s) and total hip arthroplasty is deemed medically necessary. The treatment options including medical management, injection therapy, arthroscopy and arthroplasty were discussed at length. The risks and benefits of total hip arthroplasty were presented and reviewed. The risks due to aseptic loosening, infection, stiffness, dislocation/subluxation,  thromboembolic complications and other imponderables were discussed.  The patient acknowledged the explanation, agreed to proceed with  the plan and consent was signed. Patient is being admitted for inpatient treatment for surgery, pain control, PT, OT, prophylactic antibiotics, VTE prophylaxis, progressive ambulation and ADL's and discharge planning.The patient is planning to be discharged home with home health services

## 2013-03-20 NOTE — Progress Notes (Signed)
UR completed 

## 2013-03-20 NOTE — Preoperative (Signed)
Beta Blockers   Reason not to administer Beta Blockers:Not Applicable 

## 2013-03-21 LAB — BASIC METABOLIC PANEL
BUN: 21 mg/dL (ref 6–23)
CALCIUM: 8.4 mg/dL (ref 8.4–10.5)
CO2: 24 meq/L (ref 19–32)
Chloride: 102 mEq/L (ref 96–112)
Creatinine, Ser: 0.78 mg/dL (ref 0.50–1.10)
GFR calc Af Amer: >90 mL/min (ref >90)
GFR calc non Af Amer: 88 mL/min — ABNORMAL LOW (ref >90)
GLUCOSE: 129 mg/dL — AB (ref 70–99)
POTASSIUM: 4.7 meq/L (ref 3.7–5.3)
Sodium: 140 mEq/L (ref 137–147)

## 2013-03-21 LAB — CBC
HEMATOCRIT: 33.5 % — AB (ref 36.0–46.0)
HEMOGLOBIN: 11.1 g/dL — AB (ref 12.0–15.0)
MCH: 31.4 pg (ref 26.0–34.0)
MCHC: 33.1 g/dL (ref 30.0–36.0)
MCV: 94.6 fL (ref 78.0–100.0)
Platelets: 195 10*3/uL (ref 150–400)
RBC: 3.54 MIL/uL — ABNORMAL LOW (ref 3.87–5.11)
RDW: 13.7 % (ref 11.5–15.5)
WBC: 6.7 10*3/uL (ref 4.0–10.5)

## 2013-03-21 MED ORDER — FLUTICASONE FUROATE-VILANTEROL 100-25 MCG/INH IN AEPB
1.0000 | INHALATION_SPRAY | Freq: Every day | RESPIRATORY_TRACT | Status: DC
  Administered 2013-03-22 – 2013-03-23 (×2): 1 via RESPIRATORY_TRACT

## 2013-03-21 NOTE — Evaluation (Signed)
Physical Therapy Evaluation Patient Details Name: Lori Orozco MRN: [id number] DOB: 26-Jul-1950 Today's Date: 03/21/2013 Time: 4098-1191 PT Time Calculation (min): 25 min  PT Assessment / Plan / Recommendation History of Present Illness  Pt underwent R THA posterior approach yesterday, 03/20/13.  Clinical Impression  Pt is s/p THA resulting in the deficits listed below (see PT Problem List).  Pt will benefit from skilled PT to increase their independence and safety with mobility to allow discharge to the venue listed below. Recommending [facility] SNF at d/c due to no caregiver assist at home.       PT Assessment  Patient needs continued PT services    Follow Up Recommendations  SNF    Does the patient have the potential to tolerate intense rehabilitation      Barriers to Discharge Decreased caregiver support      Equipment Recommendations  None recommended by PT    Recommendations for Other Services     Frequency 7X/week    Precautions / Restrictions Precautions Precautions: Posterior Hip Precaution Booklet Issued: Yes (comment) Precaution Comments: Pt educated on 3/3 hip precautions. Restrictions Weight Bearing Restrictions: Yes RLE Weight Bearing: Weight bearing as tolerated   Pertinent Vitals/Pain 10/10 with ambulation (asymptomatic)      Mobility  Bed Mobility Overal bed mobility: Needs Assistance Bed Mobility: Sit to Supine Sit to supine: Min assist General bed mobility comments: assist with RLE, verbal cues for technique/precautions Transfers Overall transfer level: Needs assistance Equipment used: Rolling walker (2 wheeled) Transfers: Sit to/from Stand Sit to Stand: Min assist General transfer comment: verbal cues for sequencing Ambulation/Gait Ambulation/Gait assistance: Min assist Ambulation Distance (Feet): 15 Feet Assistive device: Rolling walker (2 wheeled) Gait Pattern/deviations: Step-to pattern;Antalgic;Decreased stance time - right Gait  velocity interpretation: Below normal speed for age/gender    Exercises Total Joint Exercises Ankle Circles/Pumps: AROM;Both;10 reps;Supine Heel Slides: AAROM;Right;10 reps;Supine Hip ABduction/ADduction: AAROM;Right;10 reps;Supine   PT Diagnosis: Difficulty walking;Acute pain  PT Problem List: Decreased strength;Decreased range of motion;Decreased activity tolerance;Decreased balance;Decreased mobility;Decreased knowledge of precautions;Pain;Decreased knowledge of use of DME PT Treatment Interventions: DME instruction;Gait training;Functional mobility training;Therapeutic activities;Therapeutic exercise;Patient/family education;Balance training     PT Goals(Current goals can be found in the care plan section) Acute Rehab PT Goals Patient Stated Goal: home PT Goal Formulation: With patient Time For Goal Achievement: 03/28/13 Potential to Achieve Goals: Good  Visit Information  Last PT Received On: 03/21/13 Assistance Needed: +1 History of Present Illness: Pt underwent R THA posterior approach yesterday, 03/20/13.       Prior Functioning  Home Living Family/patient expects to be discharged to:: Skilled nursing facility Prior Function Level of Independence: Independent Communication Communication: No difficulties    Cognition  Cognition Arousal/Alertness: Awake/alert Behavior During Therapy: WFL for tasks assessed/performed Overall Cognitive Status: Within Functional Limits for tasks assessed    Extremity/Trunk Assessment     Balance    End of Session PT - End of Session Equipment Utilized During Treatment: Gait belt Activity Tolerance: Patient tolerated treatment well Patient left: in bed;with call bell/phone within reach Nurse Communication: Mobility status  GP     Ilda Foil 03/21/2013, 11:53 AM  Aida Raider, PT  Office # [phone] Pager [phone]

## 2013-03-21 NOTE — Progress Notes (Signed)
Patient ID: Lori Orozco, female   DOB: [birth year], 63 y.o.   MRN: [id number] POD#1 Awake, alert and comfortable in chair-some nausea, but eating "all breakfast"-denies SOB or chest pain. Has voided without problem. Dressing right hip clean, dry-n/v intact to RLE. Will get OOB with PT, saline lock IV. VS stable, lab ok. Will need rehab facility at D/C

## 2013-03-21 NOTE — Progress Notes (Signed)
OT Cancellation Note  Patient Details Name: ADILEE LEMME MRN: [id number] DOB: Nov 06, 1950   Cancelled Treatment:    Reason Eval/Treat Not Completed: OT screened, no needs identified, will sign off - Pt is for SNF placement.  Will therefore, defer OT eval to SNF.  If d/c plan change, please reconsult OT   Jeani Hawking, OTR/L [phone]  03/21/2013, 2:08 PM

## 2013-03-22 LAB — BASIC METABOLIC PANEL
BUN: 12 mg/dL (ref 6–23)
CALCIUM: 8.6 mg/dL (ref 8.4–10.5)
CO2: 27 meq/L (ref 19–32)
CREATININE: 0.96 mg/dL (ref 0.50–1.10)
Chloride: 101 mEq/L (ref 96–112)
GFR calc Af Amer: 72 mL/min — ABNORMAL LOW (ref >90)
GFR calc non Af Amer: 62 mL/min — ABNORMAL LOW (ref >90)
GLUCOSE: 101 mg/dL — AB (ref 70–99)
Potassium: 4.2 mEq/L (ref 3.7–5.3)
Sodium: 140 mEq/L (ref 137–147)

## 2013-03-22 LAB — CBC
HCT: 32.3 % — ABNORMAL LOW (ref 36.0–46.0)
HEMOGLOBIN: 10.6 g/dL — AB (ref 12.0–15.0)
MCH: 31.5 pg (ref 26.0–34.0)
MCHC: 32.8 g/dL (ref 30.0–36.0)
MCV: 96.1 fL (ref 78.0–100.0)
Platelets: 176 10*3/uL (ref 150–400)
RBC: 3.36 MIL/uL — AB (ref 3.87–5.11)
RDW: 14.3 % (ref 11.5–15.5)
WBC: 7.5 10*3/uL (ref 4.0–10.5)

## 2013-03-22 NOTE — Progress Notes (Signed)
Clinical Social Work Department BRIEF PSYCHOSOCIAL ASSESSMENT 03/22/2013  Patient:  Lori Orozco,Lori Orozco     Account Number:  [id number]     Admit date:  03/20/2013  Clinical Social Worker:  MORGAN,, LCSWA  Date/Time:  03/22/2013 06:32 PM  Referred by:  Physician  Date Referred:  03/21/2013 Referred for  SNF Placement   Other Referral:   Interview type:  Patient Other interview type:    PSYCHOSOCIAL DATA Living Status:  OTHER Admitted from facility:   Level of care:   Primary support name:  Karen Short- Roommate Primary support relationship to patient:  NONE Degree of support available:   Karen Short is patient's roommate and primary support identified by the patient.    CURRENT CONCERNS  Other Concerns:    SOCIAL WORK ASSESSMENT / PLAN Clinical Social Worker (CSW) met with patient to discuss D/C plan. Patient reported that she wants to go to [facility] or [facility]. CSW encouraged patient to think about a third SNF choice because [facility] and [facility] are the most requested facilities. Patient verbalized her understanding.   Assessment/plan status:  Psychosocial Support/Ongoing Assessment of Needs Other assessment/ plan:   Information/referral to community resources:   CSW gave SNF list.    PATIENT'S/FAMILY'S RESPONSE TO PLAN OF CARE: Patient thanked CSW for visit.        

## 2013-03-22 NOTE — Progress Notes (Signed)
Patient ID: Lori Orozco, female   DOB: February 07, 1951, 63 y.o.   MRN: [id number] PATIENT ID: Lori Orozco        MRN:  [id number]          DOB/AGE: [birth year] / 63 y.o.    Norlene Campbell, MD   Jacqualine Code, PA-C [address] [place], [place]  [address]                             [phone]   PROGRESS NOTE  Subjective:  negative for Chest Pain  negative for Shortness of Breath  negative for Nausea/Vomiting   negative for Calf Pain    Tolerating Diet: yes         Patient reports pain as mild and moderate.     Very comfortable.  No complaints   Objective: Vital signs in last 24 hours:   Patient Vitals for the past 24 hrs:  BP Temp Temp src Pulse Resp SpO2  03/22/13 0930 128/58 mmHg 98.6 F (37 C) Oral 103 16 93 %  03/22/13 0757 - - - - - 96 %  03/22/13 0618 153/67 mmHg 98.5 F (36.9 C) Oral 92 16 96 %  03/22/13 0214 137/71 mmHg 99.1 F (37.3 C) Oral 102 16 94 %  03/21/13 2128 138/62 mmHg 99.9 F (37.7 C) Oral 100 18 96 %  03/21/13 1833 147/60 mmHg 98.6 F (37 C) Oral 95 18 93 %  03/21/13 1400 115/81 mmHg 100 F (37.8 C) Oral 90 18 95 %      Intake/Output from previous day:   01/24 0701 - 01/25 0700 In: 960 [P.O.:960] Out: -    Intake/Output this shift:   01/25 0701 - 01/25 1900 In: 120 [P.O.:120] Out: -    Intake/Output     01/24 0701 - 01/25 0700 01/25 0701 - 01/26 0700   P.O. 960 120   I.V. (mL/kg)     Total Intake(mL/kg) 960 (11.7) 120 (1.5)   Net +960 +120        Urine Occurrence 9 x       LABORATORY DATA:  Recent Labs  03/16/13 1353 03/21/13 0540 03/22/13 0600  WBC 6.7 6.7 7.5  HGB 12.3 11.1* 10.6*  HCT 37.1 33.5* 32.3*  PLT 207 195 176    Recent Labs  03/16/13 1353 03/21/13 0540 03/22/13 0600  NA 142 140 140  K 4.8 4.7 4.2  CL 104 102 101  CO2 26 24 27   BUN 19 21 12   CREATININE 1.08 0.78 0.96  GLUCOSE 87 129* 101*  CALCIUM 9.2 8.4 8.6   Lab Results  Component Value Date   INR 0.87 03/16/2013    Recent  Radiographic Studies :  Dg Chest 2 View  03/16/2013   CLINICAL DATA:  Preop  EXAM: CHEST  2 VIEW  COMPARISON:  Prior study from 11/08/2012  FINDINGS: The cardiac and mediastinal silhouettes are stable in size and contour, and remain within normal limits.  The lungs are normally inflated. No airspace consolidation, pleural effusion, or pulmonary edema is identified. There is no pneumothorax. Irregular biapical pleural thickening noted, unchanged.  No acute osseous abnormality identified.  IMPRESSION: No active cardiopulmonary disease.   Electronically Signed   By: 03/18/2013 M.D.   On: 03/16/2013 15:06   Dg Hip Portable 1 View Right  03/20/2013   CLINICAL DATA:  Postop hip arthroplasty.  EXAM: PORTABLE RIGHT HIP - 1 VIEW  COMPARISON:  None.  FINDINGS: Exam demonstrates a total right hip arthroplasty intact and normally located. There postsurgical changes over the adjacent soft tissues. Skin staples are present laterally. There are multiple phleboliths of the pelvis.  IMPRESSION: Normal postop right total hip arthroplasty.   Electronically Signed   By: Elberta Fortis M.D.   On: 03/20/2013 15:52     Examination:  General appearance: alert, cooperative, mild distress and moderate distress Resp: clear to auscultation bilaterally Cardio: regular rate and rhythm GI: normal findings: bowel sounds normal  Wound Exam: clean, dry, intact   Drainage:  None: wound tissue dry  Motor Exam: EHL, FHL, Anterior Tibial and Posterior Tibial Intact  Sensory Exam: Superficial Peroneal, Deep Peroneal and Tibial normal  Vascular Exam: Right posterior tibial artery has trace pulse  Assessment:    2 Days Post-Op  Procedure(s) (LRB): Right TOTAL HIP ARTHROPLASTY (Right)  ADDITIONAL DIAGNOSIS:  Active Problems:   S/P total hip arthroplasty  Acute Blood Loss Anemia asymptomatic   Plan: Physical Therapy as ordered   DVT Prophylaxis:  Aspirin  DISCHARGE PLAN: Skilled Nursing  Facility/Rehab  DISCHARGE NEEDS: HHPT, Walker and 3-in-1 comode seat  Social worker seeing today to determine d/c plans       [facility] 03/22/2013 10:33 AM

## 2013-03-22 NOTE — Progress Notes (Signed)
Physical Therapy Treatment Patient Details Name: Lori Orozco MRN: [id number] DOB: 1950-12-17 Today's Date: 03/22/2013 Time: 7341-9379 PT Time Calculation (min): 24 min  PT Assessment / Plan / Recommendation  History of Present Illness     PT Comments   Gait distance continues to be limited by fatigue/nausea/ligtheaded.  Follow Up Recommendations  SNF     Does the patient have the potential to tolerate intense rehabilitation     Barriers to Discharge        Equipment Recommendations  None recommended by PT    Recommendations for Other Services    Frequency 7X/week   Progress towards PT Goals Progress towards PT goals: Progressing toward goals  Plan Current plan remains appropriate    Precautions / Restrictions Precautions Precautions: Posterior Hip Precaution Comments: Reviewed 3/3 hip precautions. Restrictions RLE Weight Bearing: Weight bearing as tolerated   Pertinent Vitals/Pain 5/10    Mobility  Bed Mobility Bed Mobility: Supine to Sit Supine to sit: Min assist General bed mobility comments: assist with RLE Transfers Equipment used: Rolling walker (2 wheeled) Sit to Stand: Min assist General transfer comment: verbal cues for hand placement Ambulation/Gait Ambulation/Gait assistance: Min assist Ambulation Distance (Feet): 20 Feet Assistive device: Rolling walker (2 wheeled) Gait Pattern/deviations: Step-to pattern;Antalgic Gait velocity interpretation: Below normal speed for age/gender    Exercises Total Joint Exercises Ankle Circles/Pumps: AROM;Both;10 reps Quad Sets: AROM;Both;10 reps Gluteal Sets: AROM;Both;10 reps Heel Slides: AAROM;Right;10 reps Hip ABduction/ADduction: AAROM;Right;10 reps   PT Diagnosis:    PT Problem List:   PT Treatment Interventions:     PT Goals (current goals can now be found in the care plan section)    Visit Information  Last PT Received On: 03/22/13 Assistance Needed: +1    Subjective Data       Cognition  Cognition Arousal/Alertness: Awake/alert Behavior During Therapy: WFL for tasks assessed/performed Overall Cognitive Status: Within Functional Limits for tasks assessed    Balance     End of Session PT - End of Session Equipment Utilized During Treatment: Gait belt Activity Tolerance: Patient limited by fatigue Patient left: in chair;with family/visitor present;with call bell/phone within reach Nurse Communication: Mobility status   GP     Ilda Foil 03/22/2013, 3:48 PM  Aida Raider, PT  Office # [phone] Pager [phone]

## 2013-03-22 NOTE — Progress Notes (Addendum)
Clinical Social Work Department CLINICAL SOCIAL WORK PLACEMENT NOTE 03/22/2013  Patient:  Lori Orozco, Lori Orozco  Account Number:  [id number] Admit date:  03/20/2013  Clinical Social Worker:  Samuella Bruin, Theresia Majors  Date/time:  03/22/2013 06:26 PM  Clinical Social Work is seeking post-discharge placement for this patient at the following level of care:   SKILLED NURSING   (*CSW will update this form in Epic as items are completed)   03/22/2013  Patient/family provided with [facility] Department of Clinical Social Work's list of facilities offering this level of care within the geographic area requested by the patient (or if unable, by the patient's family).  03/22/2013  Patient/family informed of their freedom to choose among providers that offer the needed level of care, that participate in Medicare, Medicaid or managed care program needed by the patient, have an available bed and are willing to accept the patient.    Patient/family informed of [facility]' ownership interest in [facility], as well as of the fact that they are under no obligation to receive care at this facility.  PASARR submitted to EDS on 03/22/2013 PASARR number received from EDS on 03/22/2013  FL2 transmitted to all facilities in geographic area requested by pt/family on  03/22/2013 FL2 transmitted to all facilities within larger geographic area on 03/22/2013  Patient informed that his/her managed care company has contracts with or will negotiate with  certain facilities, including the following:     Patient/family informed of bed offers received:  03/23/2013 Patient chooses bed at [facility] Physician recommends and patient chooses bed at    Patient to be transferred to [facility] on  03/23/2013 Patient to be transferred to facility by [facility]  The following physician request were entered in Epic:   Additional Comments:   Samuella Bruin, MSW, LCSWA Clinical Social  Worker [facility] Emergency Dept. [phone]

## 2013-03-23 LAB — BASIC METABOLIC PANEL
BUN: 11 mg/dL (ref 6–23)
CHLORIDE: 101 meq/L (ref 96–112)
CO2: 26 meq/L (ref 19–32)
Calcium: 8.7 mg/dL (ref 8.4–10.5)
Creatinine, Ser: 0.81 mg/dL (ref 0.50–1.10)
GFR calc Af Amer: 88 mL/min — ABNORMAL LOW (ref >90)
GFR calc non Af Amer: 76 mL/min — ABNORMAL LOW (ref >90)
GLUCOSE: 105 mg/dL — AB (ref 70–99)
POTASSIUM: 4.2 meq/L (ref 3.7–5.3)
Sodium: 140 mEq/L (ref 137–147)

## 2013-03-23 LAB — CBC
HEMATOCRIT: 30.7 % — AB (ref 36.0–46.0)
Hemoglobin: 10.5 g/dL — ABNORMAL LOW (ref 12.0–15.0)
MCH: 32.4 pg (ref 26.0–34.0)
MCHC: 34.2 g/dL (ref 30.0–36.0)
MCV: 94.8 fL (ref 78.0–100.0)
Platelets: 168 10*3/uL (ref 150–400)
RBC: 3.24 MIL/uL — AB (ref 3.87–5.11)
RDW: 14.1 % (ref 11.5–15.5)
WBC: 7.6 10*3/uL (ref 4.0–10.5)

## 2013-03-23 MED ORDER — OXYCODONE-ACETAMINOPHEN 5-325 MG PO TABS: 1.0000 | ORAL_TABLET | ORAL | Status: DC | PRN

## 2013-03-23 NOTE — Progress Notes (Signed)
CSW (Clinical Social Worker) prepared pt dc packet and placed with shadow chart. CSW arranged non-emergent ambulance transport. Pt, pt nurse, and facility informed. CSW signing off.  Walt Geathers, LCSWA [phone]  

## 2013-03-23 NOTE — Discharge Summary (Signed)
Physician Discharge Summary  Patient ID: Lori Orozco MRN: [id number] DOB/AGE: Jul 26, 1950 63 y.o.  Admit date: 03/20/2013 Discharge date: 03/23/2013  Admission Diagnoses: Osteoarthritis right hip  Discharge Diagnoses:  Active Problems:   S/P total hip arthroplasty   Discharged Condition: stable  Hospital Course: Patient's hospital course was essentially unremarkable. She underwent a right total hip arthroplasty. Postoperatively patient progressed slowly with therapy and was discharged to skilled nursing.  Consults: None  Significant Diagnostic Studies: labs: Routine labs  Treatments: surgery: See operative note  Discharge Exam: Blood pressure 140/61, pulse 102, temperature 98.8 F (37.1 C), temperature source Oral, resp. rate 18, height 5\' 6"  (1.676 m), weight 82.101 kg (181 lb), SpO2 95.00%. Incision/Wound: clean and dry  Disposition: 01-Home or Self Care   Future Appointments Provider Department Dept Phone   06/29/2013 9:30 AM 08/29/2013, MD [facility] [phone]       Medication List    ASK your doctor about these medications       albuterol 108 (90 BASE) MCG/ACT inhaler  Commonly known as:  PROAIR HFA  Inhale 2 puffs into the lungs every 4 (four) hours as needed for wheezing.     aspirin 81 MG chewable tablet  Chew 81 mg by mouth daily.     buPROPion 150 MG 24 hr tablet  Commonly known as:  WELLBUTRIN XL  Take 150 mg by mouth daily.     busPIRone 15 MG tablet  Commonly known as:  BUSPAR  Take 15 mg by mouth 2 (two) times daily before a meal.     clobetasol ointment 0.05 %  Commonly known as:  TEMOVATE  Apply to affected area daily for 2 weeks, then every other day     cyanocobalamin 2000 MCG tablet  Take 2,000 mcg by mouth daily.     cyclobenzaprine 10 MG tablet  Commonly known as:  FLEXERIL  Take 10 mg by mouth 3 (three) times daily as needed for muscle spasms.     divalproex 500 MG 24 hr tablet  Commonly known as:   DEPAKOTE ER  Take 500 mg by mouth daily.     estrogens (conjugated) 0.625 MG tablet  Commonly known as:  PREMARIN  Take 1 tablet (0.625 mg total) by mouth daily. Take daily for 21 days then do not take for 7 days.     fluticasone 50 MCG/ACT nasal spray  Commonly known as:  FLONASE  Place 2 sprays into the nose daily.     Fluticasone Furoate-Vilanterol 100-25 MCG/INH Aepb  Commonly known as:  BREO ELLIPTA  Inhale 1 puff into the lungs daily.     HYDROcodone-acetaminophen 5-325 MG per tablet  Commonly known as:  NORCO/VICODIN  Take 1 tablet by mouth every 6 (six) hours as needed for pain.     ibuprofen 200 MG tablet  Commonly known as:  ADVIL,MOTRIN  Take 200 mg by mouth every 8 (eight) hours as needed.     ipratropium-albuterol 0.5-2.5 (3) MG/3ML Soln  Commonly known as:  DUONEB  Take 3 mLs by nebulization every 6 (six) hours as needed.     levothyroxine 50 MCG tablet  Commonly known as:  SYNTHROID, LEVOTHROID  Take 50 mcg by mouth daily before breakfast.     LORazepam 1 MG tablet  Commonly known as:  ATIVAN  Take 1 mg by mouth every 8 (eight) hours as needed.     mirtazapine 45 MG tablet  Commonly known as:  REMERON  Take 45 mg by  mouth at bedtime.     oxyCODONE-acetaminophen 5-325 MG per tablet  Commonly known as:  PERCOCET/ROXICET  Take 1-2 tablets by mouth every 6 (six) hours as needed for severe pain.     scopolamine 1.5 MG  Commonly known as:  TRANSDERM-SCOP  Place 1 patch (1.5 mg total) onto the skin every 3 (three) days. As needed for motion sickness     sertraline 100 MG tablet  Commonly known as:  ZOLOFT  Take 100 mg by mouth daily.     simvastatin 20 MG tablet  Commonly known as:  ZOCOR  Take 20 mg by mouth at bedtime.           Follow-up Information   Follow up with DUDA,MARCUS V, MD In 2 weeks.   Specialty:  Orthopedic Surgery   Contact information:   [address] [place] [address] [phone]       Signed: [name] 03/23/2013, 6:36 AM

## 2013-03-23 NOTE — Discharge Instructions (Signed)
Posterior total hip precautions right hip

## 2013-03-23 NOTE — Care Management Note (Signed)
CARE MANAGEMENT NOTE 03/23/2013  Patient:  Lori Orozco, Lori Orozco   Account Number:  [id number]  Date Initiated:  03/23/2013  Documentation initiated by:  Vance Peper  Subjective/Objective Assessment:   63 yr old female s/p right total hip arthroplasty     Action/Plan:   Patient is for shortterm rehab at [facility]. Selected [facility]. Social worker is aware.   Anticipated DC Date:  03/23/2013   Anticipated DC Plan:  SKILLED NURSING FACILITY         Choice offered to / List presented to:             Status of service:  Completed, signed off Medicare Important Message given?   (If response is "NO", the following Medicare IM given date fields will be blank) Date Medicare IM given:   Date Additional Medicare IM given:    Discharge Disposition:  SKILLED NURSING FACILITY  Per UR Regulation:

## 2013-03-24 ENCOUNTER — Encounter (HOSPITAL_COMMUNITY): Payer: Self-pay | Admitting: Orthopedic Surgery

## 2013-04-04 ENCOUNTER — Other Ambulatory Visit: Payer: Self-pay | Admitting: Internal Medicine

## 2013-06-17 ENCOUNTER — Encounter: Payer: Self-pay | Admitting: Family Medicine

## 2013-06-17 ENCOUNTER — Ambulatory Visit (INDEPENDENT_AMBULATORY_CARE_PROVIDER_SITE_OTHER): Payer: Medicare Other | Admitting: Family Medicine

## 2013-06-17 VITALS — BP 118/62 | HR 92 | Temp 98.5°F | Ht 67.0 in | Wt 188.0 lb

## 2013-06-17 DIAGNOSIS — B9689 Other specified bacterial agents as the cause of diseases classified elsewhere: Secondary | ICD-10-CM | POA: Insufficient documentation

## 2013-06-17 DIAGNOSIS — H109 Unspecified conjunctivitis: Secondary | ICD-10-CM

## 2013-06-17 DIAGNOSIS — J302 Other seasonal allergic rhinitis: Secondary | ICD-10-CM | POA: Insufficient documentation

## 2013-06-17 DIAGNOSIS — F17200 Nicotine dependence, unspecified, uncomplicated: Secondary | ICD-10-CM

## 2013-06-17 DIAGNOSIS — J019 Acute sinusitis, unspecified: Secondary | ICD-10-CM

## 2013-06-17 DIAGNOSIS — J309 Allergic rhinitis, unspecified: Secondary | ICD-10-CM

## 2013-06-17 DIAGNOSIS — J3089 Other allergic rhinitis: Secondary | ICD-10-CM

## 2013-06-17 MED ORDER — AZITHROMYCIN 250 MG PO TABS: ORAL_TABLET | ORAL | Status: DC

## 2013-06-17 MED ORDER — NEOMYCIN-POLYMYXIN-HC 3.5-10000-1 OP SUSP: 3.0000 [drp] | Freq: Four times a day (QID) | OPHTHALMIC | Status: DC

## 2013-06-17 MED ORDER — MOMETASONE FUROATE 50 MCG/ACT NA SUSP: 2.0000 | Freq: Every day | NASAL | Status: DC

## 2013-06-17 NOTE — Progress Notes (Signed)
Pre visit review using our clinic review tool, if applicable. No additional management support is needed unless otherwise documented below in the visit note. 

## 2013-06-17 NOTE — Progress Notes (Signed)
Subjective:    Patient ID: Lori Orozco, female    DOB: 1950-03-22, 63 y.o.   MRN: [id number]  HPI Here with uri / allergy symptoms  Symptoms started 4 days ago -felt generally rotten - and then she began to have chills at night relieved by ibuprofen Then coughing (hack) and heavy head and sinus pain  Glands have been swollen and irritated throat  L eye is very watery and irritated /itchy (not very red)   She is out of flonase -wanted to check to see if her ins covered since otc   Some streaks of yellow /green in sputum and very thick  Using her Breo  Monday wheezed just a little   Using vapor cig instead of a regular one since Jan  Using the 16 mg nicotine (she thinks) - has not weaned it yet  A big improvement in breathing   Patient Active Problem List   Diagnosis Date Noted  . S/P total hip arthroplasty 03/20/2013  . Influenza 02/23/2013  . 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 Encounter for Medicare annual wellness exam 11/18/2012  . Acute bronchitis with bronchospasm 11/18/2012  . History of falling 11/18/2012  . Routine general medical examination at a health care facility 11/10/2012  . 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 COPD exacerbation 11/07/2011  . Osteopenia 10/19/2011  . Other screening mammogram 10/19/2011  . Hypothyroid 10/19/2011  . Lingular pneumonia 08/06/2011  . Acute bronchitis with chronic obstructive pulmonary disease (COPD) 07/17/2011  . TOBACCO USER 03/07/2010  . ANEMIA 10/11/2009  . PERIPHERAL NEUROPATHY, FEET 08/05/2009  . BACK PAIN 08/05/2009  . HAND PAIN, BILATERAL 08/05/2009  . TREMOR 03/09/2008  . MENOPAUSAL SYNDROME 10/09/2007  . DEPRESSION, MAJOR 05/20/2007  . BIPOLAR AFFECTIVE DISORDER 05/20/2007  . ANXIETY 05/20/2007  . 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 ESOPHAGEAL SPASM 05/20/2007  . HIATAL HERNIA 05/20/2007  . AMAUROSIS FUGAX 05/07/2007  .  COPD with chronic bronchitis 03/27/2007  . HYPERCHOLESTEROLEMIA, PURE 12/10/2006  . SYMPTOM, SYNDROME, CHRONIC FATIGUE 12/10/2006   Past Medical History  Diagnosis Date  . COPD (chronic obstructive pulmonary disease)   . Chronic fatigue syndrome   . Hyperlipidemia   . Anxiety   . Depression   . Irritable bowel syndrome   . Migraines   . Amaurosis fugax   . Asthma   . Cataract   . Emphysema of lung   . Thyroid disease     [place]  . Lumbar herniated disc   . PONV (postoperative nausea and vomiting)   . Hypothyroidism   . Shortness of breath   . Pneumonia   . Chronic kidney disease     frequency, nephritis when 63 yrs old  . GERD (gastroesophageal reflux disease)     occ  . H/O hiatal hernia   . Fibromyalgia   . Anemia     hx   Past Surgical History  Procedure Laterality Date  . Temporomandibular joint surgery    . Abdominal hysterectomy    . Dexa-osteopenia    . Epicondylitis    . Foot surgery Bilateral   . Carotid dopplar    . Doppler echocardiography    . Tonsillectomy    . Knee surgery      Left cartilage  . Trigger finger release Right   . Colonoscopy  05/26/2010    avms- otherwise nl , re check 10y  . Plantar fascia surgery Right   .  Wrist arthroscopy Right     ligament tear  . Eye surgery Bilateral     cataracts  . Total hip arthroplasty Right 03/20/2013    Procedure: Right TOTAL HIP ARTHROPLASTY;  Surgeon: [name], MD;  Location: [facility] OR;  Service: Orthopedics;  Laterality: Right;  Right Total Hip Arthroplasty   History  Substance Use Topics  . Smoking status: Former Smoker -- 1.00 packs/day for 43 years    Types: Cigarettes    Quit date: 02/26/2013  . Smokeless tobacco: Not on file     Comment: "vaping now"for 3 weeks  . Alcohol Use: No   Family History  Problem Relation Age of Onset  . Thyroid disease Mother   . Hypertension Mother   . Kidney disease Mother   . Cancer Father   . Colon cancer Other    Allergies  Allergen Reactions  .  Codeine Nausea Only    Makes pt stay awake  . Cyclosporine     REACTION: caused eyes to severly burn  . Cymbalta [Duloxetine Hcl] Other (See Comments)    Makes pt pass out   . Erythromycin     REACTION: GI  . Lithium Swelling  . Lyrica [Pregabalin]     Felt faint  . Neurontin [Gabapentin]     Passes  out  . Penicillins     REACTION: rash  . Rabeprazole Sodium     REACTION: keeps her awake  . Tegretol [Carbamazepine]     Body aches and cold  . Duraprep [employer], Misc.] Rash    RASH   . Tape Rash    PT ALLERGIC NYLON TAPE    Current Outpatient Prescriptions on File Prior to Visit  Medication Sig Dispense Refill  . albuterol (PROAIR HFA) 108 (90 BASE) MCG/ACT inhaler Inhale 2 puffs into the lungs every 4 (four) hours as needed for wheezing.  1 Inhaler  11  . aspirin 81 MG chewable tablet Chew 81 mg by mouth daily.        Marland Kitchen BREO ELLIPTA 100-25 MCG/INH AEPB INHALE 1 PUFF INTO THE LUNGS DAILY.  60 each  4  . buPROPion (WELLBUTRIN XL) 150 MG 24 hr tablet Take 150 mg by mouth daily.        . busPIRone (BUSPAR) 15 MG tablet Take 15 mg by mouth 2 (two) times daily before a meal.       . clobetasol ointment (TEMOVATE) 0.05 % Apply to affected area daily for 2 weeks, then every other day  30 g  2  . cyanocobalamin 2000 MCG tablet Take 2,000 mcg by mouth daily.      . cyclobenzaprine (FLEXERIL) 10 MG tablet Take 10 mg by mouth 3 (three) times daily as needed for muscle spasms.      . divalproex (DEPAKOTE) 500 MG 24 hr tablet Take 500 mg by mouth daily.        Marland Kitchen estrogens, conjugated, (PREMARIN) 0.625 MG tablet Take 1 tablet (0.625 mg total) by mouth daily. Take daily for 21 days then do not take for 7 days.  31 tablet  13  . fluticasone (FLONASE) 50 MCG/ACT nasal spray Place 2 sprays into the nose daily.  16 g  11  . HYDROcodone-acetaminophen (NORCO/VICODIN) 5-325 MG per tablet Take 1 tablet by mouth every 6 (six) hours as needed for pain.  30 tablet  0  . ibuprofen  (ADVIL,MOTRIN) 200 MG tablet Take 200 mg by mouth every 8 (eight) hours as needed.        Marland Kitchen  ipratropium-albuterol (DUONEB) 0.5-2.5 (3) MG/3ML SOLN Take 3 mLs by nebulization every 6 (six) hours as needed.  360 mL  prn  . levothyroxine (SYNTHROID, LEVOTHROID) 50 MCG tablet Take 50 mcg by mouth daily before breakfast.      . LORazepam (ATIVAN) 1 MG tablet Take 1 mg by mouth every 8 (eight) hours as needed.        . mirtazapine (REMERON) 45 MG tablet Take 45 mg by mouth at bedtime.        Marland Kitchen oxyCODONE-acetaminophen (ROXICET) 5-325 MG per tablet Take 1 tablet by mouth every 4 (four) hours as needed for severe pain.  60 tablet  0  . scopolamine (TRANSDERM-SCOP) 1.5 MG Place 1 patch (1.5 mg total) onto the skin every 3 (three) days. As needed for motion sickness  10 patch  1  . sertraline (ZOLOFT) 100 MG tablet Take 100 mg by mouth daily.        . simvastatin (ZOCOR) 20 MG tablet Take 20 mg by mouth at bedtime.       No current facility-administered medications on file prior to visit.    Review of Systems Review of Systems  Constitutional: Negative for, appetite change, and unexpected weight change.  ENT pos for congestion and drip and sinus pain  Eyes: Negative for pain and visual disturbance.  Respiratory: Negative for wheeze and shortness of breath.   Cardiovascular: Negative for cp or palpitations    Gastrointestinal: Negative for nausea, diarrhea and constipation.  Genitourinary: Negative for urgency and frequency.  Skin: Negative for pallor or rash   Neurological: Negative for weakness, light-headedness, numbness and headaches.  Hematological: Negative for adenopathy. Does not bruise/bleed easily.  Psychiatric/Behavioral: Negative for dysphoric mood. The patient is not nervous/anxious.         Objective:   Physical Exam  Constitutional: She appears well-developed and well-nourished. No distress.  HENT:  Head: Normocephalic and atraumatic.  Right Ear: External ear normal.  Left Ear:  External ear normal.  Mouth/Throat: Oropharynx is clear and moist. No oropharyngeal exudate.  Nares are injected and congested   Bilateral maxillary sinus tenderness Post nasal drip   Eyes: EOM are normal. Pupils are equal, round, and reactive to light. Right eye exhibits no discharge. Left eye exhibits no discharge.  Mild conj injection of L eye  Vision grossly nl   Neck: Normal range of motion. Neck supple.  Cardiovascular: Normal rate and regular rhythm.   Pulmonary/Chest: Effort normal and breath sounds normal. No respiratory distress. She has no wheezes. She has no rales.  Diffusely distant bs Overall much improved air exch from pre smoking cessation   Lymphadenopathy:    She has no cervical adenopathy.  Neurological: She is alert.  Skin: Skin is warm and dry. No rash noted. No pallor.  Psychiatric: She has a normal mood and affect.          Assessment & Plan:

## 2013-06-17 NOTE — Patient Instructions (Signed)
Start Nasonex daily for allergy season  Use eye drops until improved  Take zithromax for sinus infection  Drink fluids and rest  [place] job with smoking!!!! Take care of yourself  Update if not starting to improve in a week or if worsening

## 2013-06-18 NOTE — Assessment & Plan Note (Signed)
Cover with zithromax  Disc symptomatic care - see instructions on AVS  Update if not starting to improve in a week or if worsening  Allergy control

## 2013-06-18 NOTE — Assessment & Plan Note (Signed)
Suspect rel to current infx  Will try corticosporin opthy susp Update if not starting to improve in a week or if worsening   Disc symptomatic care - see instructions on AVS

## 2013-06-18 NOTE — Assessment & Plan Note (Signed)
Will add nasonex for onging cong and other nasal all symptoms Update if not starting to improve in a week or if worsening

## 2013-06-20 ENCOUNTER — Encounter: Payer: Self-pay | Admitting: Family Medicine

## 2013-06-29 ENCOUNTER — Ambulatory Visit (INDEPENDENT_AMBULATORY_CARE_PROVIDER_SITE_OTHER): Payer: Medicare Other | Admitting: Internal Medicine

## 2013-06-29 ENCOUNTER — Encounter: Payer: Self-pay | Admitting: Internal Medicine

## 2013-06-29 VITALS — BP 120/74 | HR 82 | Ht 66.0 in | Wt 189.4 lb

## 2013-06-29 DIAGNOSIS — J449 Chronic obstructive pulmonary disease, unspecified: Secondary | ICD-10-CM

## 2013-06-29 DIAGNOSIS — F17200 Nicotine dependence, unspecified, uncomplicated: Secondary | ICD-10-CM

## 2013-06-29 NOTE — Progress Notes (Signed)
Subjective:    Patient ID: [name], female    DOB: January 10, 1951, 63 y.o.   MRN: [id number]  HPI 09/07/10- 63 yoF smoker  followed for COPD and tobacco use.  Last here March 07, 2010 Says she was treated for pneumonia in June treated by an UC in [place]. F/U cxr showed clearing after Avelox. Has had pneumovax.  Still smoking 1 PPD. We discussed smoking cessation program. Discussed meds.  Today feels fine. Aware of some DOE if she hurries.   03/06/11- 60 yoF smoker  followed for COPD and tobacco use. Since last here she lost her mother and is grieving. Had flu vaccine. Still smoking about a pack a day. She had finger surgery under general anesthesia at [facility] and brings preoperative spirometry done 10/27/2010 which showed FEV1 1.54, FEV1/FVC 0.63 and FEF 25-75% 0.84, indicating moderate obstructive airways disease with some restriction of forced vital capacity, possibly air trapping. Recognizes morning cough with only scant white sputum. Denies chest pain fever or blood. Short of breath with brisk exertion. Chest x-ray: 02/12/2011-stable, no active disease, old right rib fracture, healed.  03/04/12 61 yoF smoker  followed for COPD and tobacco use. FOLLOWS FOR: breathing about the same since last visit; has had to use inhalers more than normal-recently been sick Prolonged bronchitis/ pneumonia in May required antibiotics and prednisone before clearing. A few months later another episode of bronchitis which resolved and then a third episode in October when she also had a tooth abscess requiring extraction and antibiotics. Still smokes and I pointed out the negative impact her smoking with this history of repeated bronchitis. Can't afford Spiriva. CXR 5/ 31 /13 IMPRESSION:  Patchy lingular opacity, possibly reflecting pneumonia.  Follow-up radiographs are suggested to document clearing following  resolution of therapy.  Original Report Authenticated By: Charline Bills, M.D.     07/01/12- 28 yoF smoker  followed for COPD and tobacco use. FOLLOWS FOR: good and bad days with breathing-sometimes struggle to walk long distances; Uses nebulizer and feels some what better. Still smoking one half pack per day despite all of the counseling. CXR 03/06/12 IMPRESSION:  No interval change or acute cardiopulmonary disease.  Original Report Authenticated By: Andreas Newport, M.D.   12/30/12- 66 yoF smoker  followed for COPD and tobacco use. Follows For:  SOB since on Breo - Prod cough (clear, brown) - Occas wheezing - Denies chest tightness She continues to smoke making no effort to stop despite my counseling and offers of help. Had steroid injection for disc disease. Considers Breo Ellipta a significant help. CXR 11/08/12 IMPRESSION:  No active cardiopulmonary disease.  Electronically Signed  By: Charlett Nose M.D.  On: 11/08/2012 18:10  06/29/13- 63 yoF Vape smoker  followed for COPD and nicotine use FOLLOWS FOR: Pt states she stopped smoking cigs 02-26-2013 and her breathing is doing great-no SOB, wheezing, or cough. Using mid-strength nicotine Vapes. Minor pollen rhinitis. R THR in January without respiratory complication CXR 03/16/13 IMPRESSION:  No active cardiopulmonary disease.  Electronically Signed  By: Rise Mu M.D.  On: 03/16/2013 15:06  ROS-see HPI Constitutional:   No-   weight loss, night sweats, fevers, chills, fatigue, lassitude. HEENT:   No-  headaches, difficulty swallowing, tooth/dental problems, sore throat,       No-  sneezing, itching, ear ache, nasal congestion, post nasal drip,  CV:  No-   chest pain, orthopnea, PND, swelling in lower extremities, anasarca, dizziness, palpitations Resp: +shortness of breath with exertion or at rest.  No- productive cough,  No non-productive cough,  No- coughing up of blood.               No-   change in color of mucus.  No- wheezing.   Skin: No-   rash or lesions. GI:  No-   heartburn,  indigestion, abdominal pain, nausea, vomiting,  GU: MS:  No-   joint pain or swelling.  . Neuro-     nothing unusual Psych:  No- change in mood or affect. No depression or anxiety.  No memory loss.  Objective:   Physical Exam  General- Alert, Oriented, Affect-appropriate, Distress- none acute.  Talkative Skin- rash-none, lesions- none, excoriation- none Lymphadenopathy- none Head- atraumatic            Eyes- Gross vision intact, PERRLA, conjunctivae clear secretions            Ears- Hearing, canals-normal            Nose- Clear, no-Septal dev, mucus, polyps, erosion, perforation             Throat- Mallampati III-IV , mucosa clear , drainage- none, tonsils- atrophic Neck- flexible , trachea midline, no stridor , thyroid nl, carotid no bruit Chest - symmetrical excursion , unlabored           Heart/CV- RRR , no murmur , no gallop  , no rub, nl s1 s2                           - JVD- none , edema- none, stasis changes- none, varices- none           Lung- + wheeze LUL , unlabored, cough+light , dullness-none, rub- none           Chest wall-   Abd-  Br/ Gen/ Rectal- Not done, not indicated Extrem- cyanosis- none, clubbing, none, atrophy- none, strength- nl Neuro- grossly intact to observation

## 2013-06-29 NOTE — Patient Instructions (Signed)
I am very pleased that you have been able to get off cigarettes. Now try to taper down on the nicotine concentration in your Vape.  Please call if we can help

## 2013-07-10 ENCOUNTER — Other Ambulatory Visit: Payer: Self-pay | Admitting: Family Medicine

## 2013-07-17 ENCOUNTER — Other Ambulatory Visit: Payer: Self-pay | Admitting: Obstetrics & Gynecology

## 2013-07-18 ENCOUNTER — Other Ambulatory Visit: Payer: Self-pay | Admitting: Obstetrics & Gynecology

## 2013-07-22 ENCOUNTER — Other Ambulatory Visit: Payer: Self-pay | Admitting: Obstetrics & Gynecology

## 2013-07-29 ENCOUNTER — Other Ambulatory Visit: Payer: Self-pay | Admitting: Family Medicine

## 2013-07-29 NOTE — Telephone Encounter (Signed)
Electronic refill request, no recent/future appt. (last OV was in Dec 2014 for an acute illness), please advise

## 2013-07-29 NOTE — Telephone Encounter (Signed)
Please set her up after 11/18/13 for annual exam with labs prior and refill until then- thanks

## 2013-07-30 ENCOUNTER — Ambulatory Visit (INDEPENDENT_AMBULATORY_CARE_PROVIDER_SITE_OTHER): Payer: Medicare Other | Admitting: Obstetrics & Gynecology

## 2013-07-30 ENCOUNTER — Encounter: Payer: Self-pay | Admitting: Obstetrics & Gynecology

## 2013-07-30 VITALS — BP 152/88 | HR 91 | Resp 16 | Ht 67.0 in

## 2013-07-30 DIAGNOSIS — N904 Leukoplakia of vulva: Secondary | ICD-10-CM

## 2013-07-30 DIAGNOSIS — L940 Localized scleroderma [morphea]: Secondary | ICD-10-CM

## 2013-07-30 NOTE — Progress Notes (Signed)
   Subjective:    Patient ID: Lori Orozco, female    DOB: November 06, 1950, 63 y.o.   MRN: [id number]  HPI  63 yo lady with lichen sclerosis who is here for a vulva check and a refill of her clobetasol. She has no complaints today.  Review of Systems She recently had hip replacement    Objective:   Physical Exam  Severe vulvovaginal atrophy but no evidence of lichen currently or malignancy      Assessment & Plan:  Lichen sclerosis- continue clobetasol

## 2013-08-08 NOTE — Assessment & Plan Note (Signed)
This may be progress but she should quit these as well.

## 2013-08-08 NOTE — Assessment & Plan Note (Signed)
Symptoms improved. She may need inhalers more than she realizes. Plan-watch off of cigarettes and encourage cessation of e-cigs.

## 2013-12-08 ENCOUNTER — Other Ambulatory Visit: Payer: Self-pay | Admitting: Family Medicine

## 2013-12-09 NOTE — Telephone Encounter (Signed)
Please schedule PE after the first of the year and refill until then

## 2013-12-09 NOTE — Telephone Encounter (Signed)
Electronic refill request, no recent/future appt., please advise  

## 2013-12-10 NOTE — Telephone Encounter (Signed)
appt scheduled and med refilled 

## 2013-12-11 ENCOUNTER — Other Ambulatory Visit: Payer: Self-pay

## 2013-12-29 ENCOUNTER — Encounter: Payer: Self-pay | Admitting: Internal Medicine

## 2013-12-29 ENCOUNTER — Ambulatory Visit: Payer: Medicare Other | Admitting: Internal Medicine

## 2013-12-29 VITALS — BP 142/86 | HR 83 | Ht 66.0 in | Wt 200.4 lb

## 2013-12-29 DIAGNOSIS — J4489 Other specified chronic obstructive pulmonary disease: Secondary | ICD-10-CM

## 2013-12-29 MED ORDER — AZITHROMYCIN 250 MG PO TABS: ORAL_TABLET | ORAL | Status: DC

## 2013-12-29 NOTE — Patient Instructions (Signed)
Script sent for Zpak to hold for respiratory infection  Ok to continue present meds  Please call as needed

## 2013-12-29 NOTE — Progress Notes (Signed)
Subjective:    Patient ID: Lori Orozco, female    DOB: 1951/02/14, 63 y.o.   MRN: [id number]  HPI 09/07/10- 63 yoF smoker  followed for COPD and tobacco use.  Last here March 07, 2010 Says she was treated for pneumonia in June treated by an UC in [place]. F/U cxr showed clearing after Avelox. Has had pneumovax.  Still smoking 1 PPD. We discussed smoking cessation program. Discussed meds.  Today feels fine. Aware of some DOE if she hurries.   03/06/11- 63 yoF smoker  followed for COPD and tobacco use. Since last here she lost her mother and is grieving. Had flu vaccine. Still smoking about a pack a day. She had finger surgery under general anesthesia at [facility] and brings preoperative spirometry done 10/27/2010 which showed FEV1 1.54, FEV1/FVC 0.63 and FEF 25-75% 0.84, indicating moderate obstructive airways disease with some restriction of forced vital capacity, possibly air trapping. Recognizes morning cough with only scant white sputum. Denies chest pain fever or blood. Short of breath with brisk exertion. Chest x-ray: 02/12/2011-stable, no active disease, old right rib fracture, healed.  03/04/12 63 yoF smoker  followed for COPD and tobacco use. FOLLOWS FOR: breathing about the same since last visit; has had to use inhalers more than normal-recently been sick Prolonged bronchitis/ pneumonia in May required antibiotics and prednisone before clearing. A few months later another episode of bronchitis which resolved and then a third episode in October when she also had a tooth abscess requiring extraction and antibiotics. Still smokes and I pointed out the negative impact her smoking with this history of repeated bronchitis. Can't afford Spiriva. CXR 5/ 31 /13 IMPRESSION:  Patchy lingular opacity, possibly reflecting pneumonia.  Follow-up radiographs are suggested to document clearing following  resolution of therapy.  Original Report Authenticated By: Charline Bills, M.D.     07/01/12- 63 yoF smoker  followed for COPD and tobacco use. FOLLOWS FOR: good and bad days with breathing-sometimes struggle to walk long distances; Uses nebulizer and feels some what better. Still smoking one half pack per day despite all of the counseling. CXR 03/06/12 IMPRESSION:  No interval change or acute cardiopulmonary disease.  Original Report Authenticated By: Andreas Newport, M.D.   12/30/12- 63 yoF smoker  followed for COPD and tobacco use. Follows For:  SOB since on Breo - Prod cough (clear, brown) - Occas wheezing - Denies chest tightness She continues to smoke making no effort to stop despite my counseling and offers of help. Had steroid injection for disc disease. Considers Breo Ellipta a significant help. CXR 11/08/12 IMPRESSION:  No active cardiopulmonary disease.  Electronically Signed  By: Charlett Nose M.D.  On: 11/08/2012 18:10  06/29/13- 63 yoF Vape smoker  followed for COPD and nicotine use FOLLOWS FOR: Pt states she stopped smoking cigs 02-26-2013 and her breathing is doing great-no SOB, wheezing, or cough. Using mid-strength nicotine Vapes. Minor pollen rhinitis. R THR in January without respiratory complication CXR 03/16/13 IMPRESSION:  No active cardiopulmonary disease.  Electronically Signed  By: Rise Mu M.D.  On: 03/16/2013 15:06  12/29/13- 63 yoF Vape smoker  followed for COPD and nicotine use, complicated by BiPolar,  FOLLOWS FOR: Pt states she is just getting over a cold and just finished keflex. Pt denies change in SOB. Pt c/o dry hacky cough and chest tightness at night.     ROS-see HPI Constitutional:   No-   weight loss, night sweats, fevers, chills, fatigue, lassitude. HEENT:   No-  headaches, difficulty  swallowing, tooth/dental problems, sore throat,       No-  sneezing, itching, ear ache, nasal congestion, post nasal drip,  CV:  No-   chest pain, orthopnea, PND, swelling in lower extremities, anasarca, dizziness, palpitations Resp:  +shortness of breath with exertion or at rest.              No- productive cough,  No non-productive cough,  No- coughing up of blood.               No-   change in color of mucus.  No- wheezing.   Skin: No-   rash or lesions. GI:  No-   heartburn, indigestion, abdominal pain, nausea, vomiting,  GU: MS:  No-   joint pain or swelling.  . Neuro-     nothing unusual Psych:  No- change in mood or affect. No depression or anxiety.  No memory loss.  Objective:   Physical Exam  General- Alert, Oriented, Affect-appropriate, Distress- none acute.  Talkative Skin- rash-none, lesions- none, excoriation- none Lymphadenopathy- none Head- atraumatic            Eyes- Gross vision intact, PERRLA, conjunctivae clear secretions            Ears- Hearing, canals-normal            Nose- Clear, no-Septal dev, mucus, polyps, erosion, perforation             Throat- Mallampati III-IV , mucosa clear , drainage- none, tonsils- atrophic Neck- flexible , trachea midline, no stridor , thyroid nl, carotid no bruit Chest - symmetrical excursion , unlabored           Heart/CV- RRR , no murmur , no gallop  , no rub, nl s1 s2                           - JVD- none , edema- none, stasis changes- none, varices- none           Lung- + wheeze LUL , unlabored, cough+light , dullness-none, rub- none           Chest wall-   Abd-  Br/ Gen/ Rectal- Not done, not indicated Extrem- cyanosis- none, clubbing, none, atrophy- none, strength- nl Neuro- grossly intact to observation

## 2013-12-31 ENCOUNTER — Other Ambulatory Visit: Payer: Self-pay | Admitting: *Deleted

## 2013-12-31 MED ORDER — SIMVASTATIN 20 MG PO TABS: ORAL_TABLET | ORAL | Status: DC

## 2014-02-04 ENCOUNTER — Ambulatory Visit (INDEPENDENT_AMBULATORY_CARE_PROVIDER_SITE_OTHER): Payer: Medicare Other | Admitting: Obstetrics & Gynecology

## 2014-02-04 ENCOUNTER — Encounter: Payer: Self-pay | Admitting: Obstetrics & Gynecology

## 2014-02-04 VITALS — BP 155/80 | HR 90 | Resp 16 | Ht 67.0 in | Wt 195.0 lb

## 2014-02-04 DIAGNOSIS — N904 Leukoplakia of vulva: Secondary | ICD-10-CM

## 2014-02-04 MED ORDER — CLOBETASOL PROPIONATE 0.05 % EX OINT: TOPICAL_OINTMENT | CUTANEOUS | Status: DC

## 2014-02-04 NOTE — Progress Notes (Signed)
   Subjective:    Patient ID: Lori Orozco, female    DOB: 05-10-1950, 63 y.o.   MRN: [id number]  HPI  63 yo lady with biopsy-proven lichen sclerosus is here for her biannual vulvar check. She needs a refill of her temovate.  Review of Systems     Objective:   Physical Exam There is a 3 mm white area at the posterior fourchette. The remainder of her vulva is atrophic. The clitorus is not visible. She is not up for a biopsy of the white area today.       Assessment & Plan:  Lichen-  She will make sure that the white area receives temovate 3 times per week. If it is still there at her next visit, she is agreeable to have it biopsied.

## 2014-03-09 ENCOUNTER — Other Ambulatory Visit: Payer: Self-pay | Admitting: Family Medicine

## 2014-04-07 ENCOUNTER — Other Ambulatory Visit: Payer: Self-pay | Admitting: Family Medicine

## 2014-04-13 ENCOUNTER — Encounter: Payer: Medicare Other | Admitting: Family Medicine

## 2014-04-23 ENCOUNTER — Encounter: Payer: Self-pay | Admitting: Family Medicine

## 2014-04-23 ENCOUNTER — Ambulatory Visit (INDEPENDENT_AMBULATORY_CARE_PROVIDER_SITE_OTHER): Payer: Medicare Other | Admitting: Family Medicine

## 2014-04-23 VITALS — BP 132/80 | HR 78 | Temp 98.1°F | Ht 67.0 in | Wt 198.8 lb

## 2014-04-23 DIAGNOSIS — Z0000 Encounter for general adult medical examination without abnormal findings: Secondary | ICD-10-CM

## 2014-04-23 DIAGNOSIS — M8580 Other specified disorders of bone density and structure, unspecified site: Secondary | ICD-10-CM

## 2014-04-23 DIAGNOSIS — E7800 Pure hypercholesterolemia, unspecified: Secondary | ICD-10-CM

## 2014-04-23 DIAGNOSIS — E039 Hypothyroidism, unspecified: Secondary | ICD-10-CM

## 2014-04-23 LAB — COMPREHENSIVE METABOLIC PANEL
ALBUMIN: 4 g/dL (ref 3.5–5.2)
ALK PHOS: 67 U/L (ref 39–117)
ALT: 12 U/L (ref 0–35)
AST: 15 U/L (ref 0–37)
BILIRUBIN TOTAL: 0.4 mg/dL (ref 0.2–1.2)
BUN: 20 mg/dL (ref 6–23)
CO2: 30 mEq/L (ref 19–32)
Calcium: 9.1 mg/dL (ref 8.4–10.5)
Chloride: 103 mEq/L (ref 96–112)
Creatinine, Ser: 1.13 mg/dL (ref 0.40–1.20)
GFR: 51.55 mL/min — ABNORMAL LOW (ref >60.00)
Glucose, Bld: 82 mg/dL (ref 70–99)
POTASSIUM: 4.1 meq/L (ref 3.5–5.1)
SODIUM: 138 meq/L (ref 135–145)
Total Protein: 6.7 g/dL (ref 6.0–8.3)

## 2014-04-23 LAB — TSH: TSH: 1.25 u[IU]/mL (ref 0.35–4.50)

## 2014-04-23 LAB — LIPID PANEL
CHOL/HDL RATIO: 3
Cholesterol: 182 mg/dL (ref 0–200)
HDL: 68.7 mg/dL (ref >39.00)
LDL Cholesterol: 97 mg/dL (ref 0–99)
NonHDL: 113.3
TRIGLYCERIDES: 84 mg/dL (ref 0.0–149.0)
VLDL: 16.8 mg/dL (ref 0.0–40.0)

## 2014-04-23 LAB — VITAMIN D 25 HYDROXY (VIT D DEFICIENCY, FRACTURES): VITD: 17.16 ng/mL — ABNORMAL LOW (ref 30.00–100.00)

## 2014-04-23 MED ORDER — FLUTICASONE PROPIONATE 50 MCG/ACT NA SUSP: NASAL | Status: DC

## 2014-04-23 MED ORDER — LEVOTHYROXINE SODIUM 50 MCG PO TABS: ORAL_TABLET | ORAL | Status: DC

## 2014-04-23 MED ORDER — SIMVASTATIN 20 MG PO TABS: ORAL_TABLET | ORAL | Status: DC

## 2014-04-23 NOTE — Patient Instructions (Addendum)
Labs today  Don't forget to schedule your mammogram  I gave you some materials to work on an advanced directive (if you want to revise what you have)   Take care of yourself

## 2014-04-23 NOTE — Progress Notes (Signed)
Subjective:    Patient ID: Lori Orozco, female    DOB: 20-Apr-1950, 64 y.o.   MRN: [id number]  HPI Here for annual medicare wellness visit as well as chronic/acute medical problems   Wt is up 3 lb with  bmi of 31  I have personally reviewed the Medicare Annual Wellness questionnaire and have noted 1. The patient's medical and social history 2. Their use of alcohol, tobacco or illicit drugs 3. Their current medications and supplements 4. The patient's functional ability including ADL's, fall risks, home safety risks and hearing or visual             impairment. 5. Diet and physical activities 6. Evidence for depression or mood disorders  The patients weight, height, BMI have been recorded in the chart and visual acuity is per eye clinic.  I have made referrals, counseling and provided education to the patient based review of the above and I have provided the pt with a written personalized care plan for preventive services.  Doing all right overall  Has had several colds  Her pulmonologist gave her a px for zpack to hold on to and she took it  Feeling better   See scanned forms.  Routine anticipatory guidance given to patient.  See health maintenance. Colon cancer screening 3/12 -with 10 year recall  Breast cancer screening 12/14 - just got a letter in the mail / will schedule it Lori Orozco) Self breast exam-no lumps or changes  Gyn care - saw Dr Lori Orozco in Dec- following lichen sclerosis (goes every 6 mo)- watching a new spot - has had multiple biopsies (found out her oldest sister has it too)  Declines HIV screen-not high risk  Flu vaccine 9/15  Tetanus vaccine 8/13  Pneumovax 7/12 Zoster vaccine -was interested in one but medicare will not pay   Advance directive - she has done it years ago / may revise it -given materials  Cognitive function addressed- see scanned forms- and if abnormal then additional documentation follows.   PMH and SH reviewed  Meds, vitals,  and allergies reviewed.   ROS: See HPI.  Otherwise negative.    Had dexa 9/13 normal   Due for thyroid check Lab Results  Component Value Date   TSH 1.05 11/11/2012    Hx of CKD   Chemistry      Component Value Date/Time   NA 140 03/23/2013 0535   K 4.2 03/23/2013 0535   CL 101 03/23/2013 0535   CO2 26 03/23/2013 0535   BUN 11 03/23/2013 0535   CREATININE 0.81 03/23/2013 0535      Component Value Date/Time   CALCIUM 8.7 03/23/2013 0535   ALKPHOS 57 03/16/2013 1353   AST 17 03/16/2013 1353   ALT 13 03/16/2013 1353   BILITOT 0.3 03/16/2013 1353     was ok last time   Due for chol check Lab Results  Component Value Date   CHOL 164 11/11/2012   HDL 60.00 11/11/2012   LDLCALC 86 11/11/2012   TRIG 88.0 11/11/2012   CHOLHDL 3 11/11/2012    Diet is fair - she eats too much but healthy food  She eats at night watching TV- it is hard  Wt is up 3 lb     Patient Active Problem List   Diagnosis Date Noted  . Acute bacterial sinusitis 06/17/2013  . Allergic rhinitis 06/17/2013  . Conjunctivitis, left eye 06/17/2013  . S/P total hip arthroplasty 03/20/2013  . Lichen sclerosus et atrophicus of  the vulva 01/29/2013  . Encounter for Medicare annual wellness exam 11/18/2012  . History of falling 11/18/2012  . Routine general medical examination at a health care facility 11/10/2012  . 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 COPD exacerbation 11/07/2011  . Osteopenia 10/19/2011  . Other screening mammogram 10/19/2011  . Hypothyroid 10/19/2011  . Acute bronchitis with chronic obstructive pulmonary disease (COPD) 07/17/2011  . Tobacco use disorder 03/07/2010  . ANEMIA 10/11/2009  . PERIPHERAL NEUROPATHY, FEET 08/05/2009  . BACK PAIN 08/05/2009  . HAND PAIN, BILATERAL 08/05/2009  . TREMOR 03/09/2008  . MENOPAUSAL SYNDROME 10/09/2007  . DEPRESSION, MAJOR 05/20/2007  . BIPOLAR AFFECTIVE DISORDER 05/20/2007  . ANXIETY 05/20/2007  . PERSONALITY DISORDER  05/20/2007  . 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 ESOPHAGEAL SPASM 05/20/2007  . HIATAL HERNIA 05/20/2007  . AMAUROSIS FUGAX 05/07/2007  . COPD with chronic bronchitis 03/27/2007  . HYPERCHOLESTEROLEMIA, PURE 12/10/2006  . SYMPTOM, SYNDROME, CHRONIC FATIGUE 12/10/2006   Past Medical History  Diagnosis Date  . COPD (chronic obstructive pulmonary disease)   . Chronic fatigue syndrome   . Hyperlipidemia   . Anxiety   . Depression   . Irritable bowel syndrome   . Migraines   . Amaurosis fugax   . Asthma   . Cataract   . Emphysema of lung   . Thyroid disease     [place]  . Lumbar herniated disc   . PONV (postoperative nausea and vomiting)   . Hypothyroidism   . Shortness of breath   . Pneumonia   . Chronic kidney disease     frequency, nephritis when 64 yrs old  . GERD (gastroesophageal reflux disease)     occ  . H/O hiatal hernia   . Fibromyalgia   . Anemia     hx   Past Surgical History  Procedure Laterality Date  . Temporomandibular joint surgery    . Abdominal hysterectomy    . Dexa-osteopenia    . Epicondylitis    . Foot surgery Bilateral   . Carotid dopplar    . Doppler echocardiography    . Tonsillectomy    . Knee surgery      Left cartilage  . Trigger finger release Right   . Colonoscopy  05/26/2010    avms- otherwise nl , re check 10y  . Plantar fascia surgery Right   . Wrist arthroscopy Right     ligament tear  . Eye surgery Bilateral     cataracts  . Total hip arthroplasty Right 03/20/2013    Procedure: Right TOTAL HIP ARTHROPLASTY;  Surgeon: [name], MD;  Location: [facility] OR;  Service: Orthopedics;  Laterality: Right;  Right Total Hip Arthroplasty   History  Substance Use Topics  . Smoking status: Former Smoker -- 1.00 packs/day for 43 years    Types: Cigarettes    Quit date: 02/26/2013  . Smokeless tobacco: Not on file     Comment: "vaping now"  . Alcohol Use: No   Family History  Problem Relation Age of Onset  . Thyroid disease Mother    . Hypertension Mother   . Kidney disease Mother   . Cancer Father   . Colon cancer Other    Allergies  Allergen Reactions  . Codeine Nausea Only    Makes pt stay awake  . Cyclosporine     REACTION: caused eyes to severly burn  . Cymbalta [Duloxetine Hcl] Other (See Comments)    Makes pt pass out   . Erythromycin  REACTION: GI  . Lithium Swelling  . Lyrica [Pregabalin]     Felt faint  . Neurontin [Gabapentin]     Passes  out  . Penicillins     REACTION: rash  . Rabeprazole Sodium     REACTION: keeps her awake  . Tegretol [Carbamazepine]     Body aches and cold  . Duraprep [employer], Misc.] Rash    RASH   . Tape Rash    PT ALLERGIC NYLON TAPE    Current Outpatient Prescriptions on File Prior to Visit  Medication Sig Dispense Refill  . albuterol (PROAIR HFA) 108 (90 BASE) MCG/ACT inhaler Inhale 2 puffs into the lungs every 4 (four) hours as needed for wheezing. 1 Inhaler 11  . aspirin 81 MG chewable tablet Chew 81 mg by mouth daily.      Marland Kitchen BREO ELLIPTA 100-25 MCG/INH AEPB INHALE 1 PUFF INTO THE LUNGS DAILY. 60 each 4  . buPROPion (WELLBUTRIN XL) 150 MG 24 hr tablet Take 150 mg by mouth daily.      . busPIRone (BUSPAR) 15 MG tablet Take 15 mg by mouth 2 (two) times daily before a meal.     . clobetasol ointment (TEMOVATE) 0.05 % Apply to affected area every night for 4 weeks, then every other day for 4 weeks and then twice a week for 4 weeks or until resolution. 30 g 5  . cyanocobalamin 2000 MCG tablet Take 2,000 mcg by mouth daily.    . cyclobenzaprine (FLEXERIL) 10 MG tablet Take 10 mg by mouth 3 (three) times daily as needed for muscle spasms.    . divalproex (DEPAKOTE) 500 MG 24 hr tablet Take 500 mg by mouth daily.      Marland Kitchen HYDROcodone-acetaminophen (NORCO/VICODIN) 5-325 MG per tablet Take 1 tablet by mouth every 6 (six) hours as needed for pain. 30 tablet 0  . ibuprofen (ADVIL,MOTRIN) 200 MG tablet Take 200 mg by mouth every 8 (eight) hours as needed.       Marland Kitchen LORazepam (ATIVAN) 1 MG tablet Take 1 mg by mouth every 8 (eight) hours as needed.      . mirtazapine (REMERON) 45 MG tablet Take 45 mg by mouth at bedtime.      . nabumetone (RELAFEN) 750 MG tablet Take 750 mg by mouth 2 (two) times daily.    Marland Kitchen Pentafluoroprop-Tetrafluoroeth (GEBAUERS SPRAY AND STRETCH EX) Apply topically as needed.    Marland Kitchen PREMARIN 0.625 MG tablet TAKE ONE TABLET BY MOUTH DAILY FOR 21 DAYS THEN DO NOT TAKE FOR 7 DAYS 21 tablet 13  . sertraline (ZOLOFT) 100 MG tablet Take 100 mg by mouth daily.       No current facility-administered medications on file prior to visit.      Review of Systems Review of Systems  Constitutional: Negative for fever, appetite change, fatigue and unexpected weight change.  Eyes: Negative for pain and visual disturbance.  Respiratory: Negative for cough and pos for intermittent  shortness of breath.   Cardiovascular: Negative for cp or palpitations    Gastrointestinal: Negative for nausea, diarrhea and constipation.  Genitourinary: Negative for urgency and frequency.  Skin: Negative for pallor or rash   Neurological: Negative for weakness, light-headedness, numbness and headaches.  Hematological: Negative for adenopathy. Does not bruise/bleed easily.  Psychiatric/Behavioral: Negative for dysphoric mood. The patient is not nervous/anxious.         Objective:   Physical Exam  Constitutional: She appears well-developed and well-nourished. No distress.  overwt and well app  HENT:  Head: Normocephalic and atraumatic.  Right Ear: External ear normal.  Left Ear: External ear normal.  Mouth/Throat: Oropharynx is clear and moist.  Eyes: Conjunctivae and EOM are normal. Pupils are equal, round, and reactive to light. No scleral icterus.  Neck: Normal range of motion. Neck supple. No JVD present. Carotid bruit is not present. No thyromegaly present.  Cardiovascular: Normal rate, regular rhythm, normal heart sounds and intact distal pulses.  Exam  reveals no gallop.   Pulmonary/Chest: Effort normal and breath sounds normal. No respiratory distress. She has no wheezes. She exhibits no tenderness.  Diffusely distant bs  No wheeze  Abdominal: Soft. Bowel sounds are normal. She exhibits no distension, no abdominal bruit and no mass. There is no tenderness.  Genitourinary: No breast swelling, tenderness, discharge or bleeding.  Breast exam: No mass, nodules, thickening, tenderness, bulging, retraction, inflamation, nipple discharge or skin changes noted.  No axillary or clavicular LA.      Musculoskeletal: Normal range of motion. She exhibits no edema or tenderness.  Lymphadenopathy:    She has no cervical adenopathy.  Neurological: She is alert. She has normal reflexes. No cranial nerve deficit. She exhibits normal muscle tone. Coordination normal.  Skin: Skin is warm and dry. No rash noted. No erythema. No pallor.  Some lentigos and keratoses   Psychiatric: She has a normal mood and affect.          Assessment & Plan:   Problem List Items Addressed This Visit      Endocrine   Hypothyroid    tsh today  No clinical changes  Refilled levothyroxine       Relevant Medications   levothyroxine (SYNTHROID, LEVOTHROID) tablet   Other Relevant Orders   TSH (Completed)     Musculoskeletal and Integument   Osteopenia    Nl dexa 9/13 Understands inc risk as former smoker  Disc need for calcium/ vitamin D/ wt bearing exercise and bone density test every 2 y to monitor Disc safety/ fracture risk in detail   Will plan to check at age 20       Relevant Orders   Vit D  25 hydroxy (rtn osteoporosis monitoring) (Completed)     Other   Encounter for Medicare annual wellness exam - Primary    Reviewed health habits including diet and exercise and skin cancer prevention Reviewed appropriate screening tests for age  Also reviewed health mt list, fam hx and immunization status , as well as social and family history   Urged to  schedule her annual mammogram  Given materials to revise her advanced directive   Labs today  Commended on being nicotine free        HYPERCHOLESTEROLEMIA, PURE    Disc goals for lipids and reasons to control them Rev labs with pt (from last check) Lipid panel drawn today Rev low sat fat diet in detail       Relevant Medications   simvastatin (ZOCOR) tablet   Other Relevant Orders   Comprehensive metabolic panel (Completed)   Lipid panel (Completed)

## 2014-04-23 NOTE — Progress Notes (Signed)
Pre visit review using our clinic review tool, if applicable. No additional management support is needed unless otherwise documented below in the visit note. 

## 2014-04-25 NOTE — Assessment & Plan Note (Signed)
Disc goals for lipids and reasons to control them Rev labs with pt (from last check) Lipid panel drawn today Rev low sat fat diet in detail

## 2014-04-25 NOTE — Assessment & Plan Note (Signed)
Reviewed health habits including diet and exercise and skin cancer prevention Reviewed appropriate screening tests for age  Also reviewed health mt list, fam hx and immunization status , as well as social and family history   Urged to schedule her annual mammogram  Given materials to revise her advanced directive   Labs today  Commended on being nicotine free

## 2014-04-25 NOTE — Assessment & Plan Note (Signed)
Nl dexa 9/13 Understands inc risk as former smoker  Disc need for calcium/ vitamin D/ wt bearing exercise and bone density test every 2 y to monitor Disc safety/ fracture risk in detail   Will plan to check at age 64

## 2014-04-25 NOTE — Assessment & Plan Note (Signed)
tsh today  No clinical changes  Refilled levothyroxine

## 2014-04-26 ENCOUNTER — Encounter: Payer: Self-pay | Admitting: Family Medicine

## 2014-04-26 ENCOUNTER — Other Ambulatory Visit: Payer: Self-pay | Admitting: Family Medicine

## 2014-04-26 DIAGNOSIS — Z1231 Encounter for screening mammogram for malignant neoplasm of breast: Secondary | ICD-10-CM

## 2014-04-27 ENCOUNTER — Telehealth: Payer: Self-pay | Admitting: Family Medicine

## 2014-04-27 DIAGNOSIS — E559 Vitamin D deficiency, unspecified: Secondary | ICD-10-CM | POA: Insufficient documentation

## 2014-04-27 MED ORDER — ERGOCALCIFEROL 1.25 MG (50000 UT) PO CAPS: 50000.0000 [IU] | ORAL_CAPSULE | ORAL | Status: DC

## 2014-04-27 NOTE — Telephone Encounter (Signed)
Lori Orozco left v/m wanting to ck on vit D rx. Spoke with Toni Amend at [facility] and rx was received;nothing further needed.

## 2014-04-27 NOTE — Telephone Encounter (Signed)
Sending vit D to her pharmacy for a 12 week course

## 2014-04-28 ENCOUNTER — Ambulatory Visit (INDEPENDENT_AMBULATORY_CARE_PROVIDER_SITE_OTHER): Payer: Medicare Other

## 2014-04-28 DIAGNOSIS — Z1231 Encounter for screening mammogram for malignant neoplasm of breast: Secondary | ICD-10-CM

## 2014-04-29 ENCOUNTER — Other Ambulatory Visit: Payer: Self-pay | Admitting: Family Medicine

## 2014-06-04 ENCOUNTER — Other Ambulatory Visit: Payer: Self-pay | Admitting: Family Medicine

## 2014-06-15 ENCOUNTER — Other Ambulatory Visit: Payer: Self-pay | Admitting: Family Medicine

## 2014-07-01 ENCOUNTER — Ambulatory Visit (INDEPENDENT_AMBULATORY_CARE_PROVIDER_SITE_OTHER): Payer: Medicare Other | Admitting: Internal Medicine

## 2014-07-01 ENCOUNTER — Ambulatory Visit (INDEPENDENT_AMBULATORY_CARE_PROVIDER_SITE_OTHER)
Admission: RE | Admit: 2014-07-01 | Discharge: 2014-07-01 | Disposition: A | Discharge status: Home / Self Care | Payer: Medicare Other | Source: Ambulatory Visit | Attending: Internal Medicine | Admitting: Internal Medicine

## 2014-07-01 ENCOUNTER — Encounter: Payer: Self-pay | Admitting: Internal Medicine

## 2014-07-01 VITALS — BP 142/68 | HR 89 | Ht 66.0 in | Wt 203.4 lb

## 2014-07-01 DIAGNOSIS — J3089 Other allergic rhinitis: Secondary | ICD-10-CM

## 2014-07-01 DIAGNOSIS — J432 Centrilobular emphysema: Secondary | ICD-10-CM

## 2014-07-01 DIAGNOSIS — J302 Other seasonal allergic rhinitis: Secondary | ICD-10-CM

## 2014-07-01 DIAGNOSIS — J449 Chronic obstructive pulmonary disease, unspecified: Secondary | ICD-10-CM

## 2014-07-01 DIAGNOSIS — J309 Allergic rhinitis, unspecified: Secondary | ICD-10-CM | POA: Diagnosis not present

## 2014-07-01 MED ORDER — AZITHROMYCIN 250 MG PO TABS: ORAL_TABLET | ORAL | Status: DC

## 2014-07-01 NOTE — Patient Instructions (Signed)
Order CXR - dx centrilobular emphysema  Script for Zpak to hold  Please call as needed

## 2014-07-01 NOTE — Progress Notes (Signed)
Subjective:    Patient ID: Lori Orozco, female    DOB: [birth year], 64 y.o.   MRN: [id number]  HPI 09/07/10- 8 yoF smoker  followed for COPD and tobacco use.  Last here March 07, 2010 Says she was treated for pneumonia in June treated by an UC in [place]. F/U cxr showed clearing after Avelox. Has had pneumovax.  Still smoking 1 PPD. We discussed smoking cessation program. Discussed meds.  Today feels fine. Aware of some DOE if she hurries.   03/06/11- 60 yoF smoker  followed for COPD and tobacco use. Since last here she lost her mother and is grieving. Had flu vaccine. Still smoking about a pack a day. She had finger surgery under general anesthesia at [facility] and brings preoperative spirometry done 10/27/2010 which showed FEV1 1.54, FEV1/FVC 0.63 and FEF 25-75% 0.84, indicating moderate obstructive airways disease with some restriction of forced vital capacity, possibly air trapping. Recognizes morning cough with only scant white sputum. Denies chest pain fever or blood. Short of breath with brisk exertion. Chest x-ray: 02/12/2011-stable, no active disease, old right rib fracture, healed.  03/04/12 61 yoF smoker  followed for COPD and tobacco use. FOLLOWS FOR: breathing about the same since last visit; has had to use inhalers more than normal-recently been sick Prolonged bronchitis/ pneumonia in May required antibiotics and prednisone before clearing. A few months later another episode of bronchitis which resolved and then a third episode in October when she also had a tooth abscess requiring extraction and antibiotics. Still smokes and I pointed out the negative impact her smoking with this history of repeated bronchitis. Can't afford Spiriva. CXR 5/ 31 /13 IMPRESSION:  Patchy lingular opacity, possibly reflecting pneumonia.  Follow-up radiographs are suggested to document clearing following  resolution of therapy.  Original Report Authenticated By: Charline Bills, M.D.     07/01/12- 78 yoF smoker  followed for COPD and tobacco use. FOLLOWS FOR: good and bad days with breathing-sometimes struggle to walk long distances; Uses nebulizer and feels some what better. Still smoking one half pack per day despite all of the counseling. CXR 03/06/12 IMPRESSION:  No interval change or acute cardiopulmonary disease.  Original Report Authenticated By: Andreas Newport, M.D.   12/30/12- 5 yoF smoker  followed for COPD and tobacco use. Follows For:  SOB since on Breo - Prod cough (clear, brown) - Occas wheezing - Denies chest tightness She continues to smoke making no effort to stop despite my counseling and offers of help. Had steroid injection for disc disease. Considers Breo Ellipta a significant help. CXR 11/08/12 IMPRESSION:  No active cardiopulmonary disease.  Electronically Signed  By: Charlett Nose M.D.  On: 11/08/2012 18:10  06/29/13- 63 yoF Vape smoker  followed for COPD and nicotine use FOLLOWS FOR: Pt states she stopped smoking cigs 02-26-2013 and her breathing is doing great-no SOB, wheezing, or cough. Using mid-strength nicotine Vapes. Minor pollen rhinitis. R THR in January without respiratory complication CXR 03/16/13 IMPRESSION:  No active cardiopulmonary disease.  Electronically Signed  By: Rise Mu M.D.  On: 03/16/2013 15:06  12/29/13- 63 yoF Vape smoker  followed for COPD and nicotine use, complicated by BiPolar,  FOLLOWS FOR: Pt states she is just getting over a cold and just finished keflex. Pt denies change in SOB. Pt c/o dry hacky cough and chest tightness at night.   06/29/13- 63 yoF Vape smoker  followed for COPD and nicotine use, complicated by BiPolar,  FOLLOWS YHC:WCBJS good now.Sob same with exertion,denies wheeze and  cough,decreased energy. No tobacco in over a year and she is using Vapes with no nicotine, slowing down. She feels well. Z-Pak cleared a bronchitis. Discussed avoiding quinolone antibiotics because of peripheral  neuropathy-guided by her neurologist  ROS-see HPI Constitutional:   No-   weight loss, night sweats, fevers, chills, fatigue, lassitude. HEENT:   No-  headaches, difficulty swallowing, tooth/dental problems, sore throat,       No-  sneezing, itching, ear ache, nasal congestion, post nasal drip,  CV:  No-   chest pain, orthopnea, PND, swelling in lower extremities, anasarca, dizziness, palpitations Resp: +shortness of breath with exertion or at rest.              No- productive cough,  No non-productive cough,  No- coughing up of blood.               No-   change in color of mucus.  No- wheezing.   Skin: No-   rash or lesions. GI:  No-   heartburn, indigestion, abdominal pain, nausea, vomiting,  GU: MS:  No-   joint pain or swelling.  . Neuro-     nothing unusual Psych:  No- change in mood or affect. No depression or anxiety.  No memory loss.  Objective:   Physical Exam  General- Alert, Oriented, Affect-appropriate, Distress- none acute.  Talkative Skin- rash-none, lesions- none, excoriation- none Lymphadenopathy- none Head- atraumatic            Eyes- Gross vision intact, PERRLA, conjunctivae clear secretions            Ears- Hearing, canals-normal            Nose- Clear, no-Septal dev, mucus, polyps, erosion, perforation             Throat- Mallampati III-IV , mucosa clear , drainage- none, tonsils- atrophic Neck- flexible , trachea midline, no stridor , thyroid nl, carotid no bruit Chest - symmetrical excursion , unlabored           Heart/CV- RRR , no murmur , no gallop  , no rub, nl s1 s2                           - JVD- none , edema- none, stasis changes- none, varices- none           Lung- clear , unlabored, cough-none , dullness-none, rub- none           Chest wall-   Abd-  Br/ Gen/ Rectal- Not done, not indicated Extrem- cyanosis- none, clubbing, none, atrophy- none, strength- nl Neuro- grossly intact to observation

## 2014-07-15 ENCOUNTER — Encounter: Payer: Self-pay | Admitting: Family Medicine

## 2014-07-25 NOTE — Assessment & Plan Note (Signed)
As she has stopped smoking cigarettes, the irritant rhinitis component has improved. She noted mild seasonal pollen symptoms early in the spring. We will watch her pattern through other seasons

## 2014-07-25 NOTE — Assessment & Plan Note (Signed)
She has improved substantially on clinical exam since stopping cigarettes. Plan-I encouraged her to get off of her Vapes also. Z-Pak to hold. Chest x-ray

## 2014-07-28 ENCOUNTER — Other Ambulatory Visit: Payer: Self-pay | Admitting: Obstetrics & Gynecology

## 2014-08-18 ENCOUNTER — Encounter: Payer: Self-pay | Admitting: Obstetrics & Gynecology

## 2014-08-18 ENCOUNTER — Ambulatory Visit (INDEPENDENT_AMBULATORY_CARE_PROVIDER_SITE_OTHER): Payer: Medicare Other | Admitting: Obstetrics & Gynecology

## 2014-08-18 VITALS — BP 146/85 | HR 96 | Resp 16 | Ht 67.0 in | Wt 204.0 lb

## 2014-08-18 DIAGNOSIS — N904 Leukoplakia of vulva: Secondary | ICD-10-CM | POA: Diagnosis not present

## 2014-08-18 NOTE — Progress Notes (Signed)
   Subjective:    Patient ID: Lori Orozco, female    DOB: 05-23-1950, 64 y.o.   MRN: [id number]  HPI  64 yo lady is here for her q 6 month vulva check. She is using her clobetasol. She has no complaints.  Review of Systems     Objective:   Physical Exam  WNWHWFNAD Breathing and ambulating normally Abd- benign EG- atrophyic, varicosities, 2 mm area of white right at the introitus. I applied vinegar and used magnification to view the area. No areas of obvious dysplasia.      Assessment & Plan:  She will pay special attention to applying the clobetasol to the introitus for the next 2 months, but if this area is still white, then she will need a biopsy. She did not want a biopsy today.

## 2014-10-18 ENCOUNTER — Encounter: Payer: Self-pay | Admitting: Obstetrics & Gynecology

## 2014-10-18 ENCOUNTER — Ambulatory Visit (INDEPENDENT_AMBULATORY_CARE_PROVIDER_SITE_OTHER): Payer: Medicare Other | Admitting: Obstetrics & Gynecology

## 2014-10-18 VITALS — BP 144/80 | HR 90 | Resp 16 | Ht 67.0 in | Wt 207.0 lb

## 2014-10-18 DIAGNOSIS — N904 Leukoplakia of vulva: Secondary | ICD-10-CM

## 2014-10-18 NOTE — Progress Notes (Signed)
   Subjective:    Patient ID: Lori Orozco, female    DOB: 08-Jul-1950, 64 y.o.   MRN: [id number]  HPI This pleasant 64 yo lady with lichen sclerosis is here today to see if her attention to applying temovate to the introitus will cause the last white area to resolve. She is very nervous about getting another vulvar biopsy.   Review of Systems     Objective:   Physical Exam  WNWHWF, anxious Breathing, conversing, and ambulating, normally Abd- benign EG- severe atrophy, there is still a very small area of white at the introitus (It does seem somewhat smaller).      Assessment & Plan:  Lichen- If the white does not resolve in 3 months, rec biopsy

## 2014-10-27 ENCOUNTER — Other Ambulatory Visit: Payer: Self-pay | Admitting: Obstetrics & Gynecology

## 2014-12-03 ENCOUNTER — Other Ambulatory Visit: Payer: Self-pay | Admitting: Sports Medicine

## 2014-12-03 DIAGNOSIS — M25562 Pain in left knee: Secondary | ICD-10-CM

## 2014-12-17 ENCOUNTER — Ambulatory Visit
Admission: RE | Admit: 2014-12-17 | Discharge: 2014-12-17 | Disposition: A | Discharge status: Home / Self Care | Payer: Medicare Other | Source: Ambulatory Visit | Attending: Sports Medicine | Admitting: Sports Medicine

## 2014-12-17 ENCOUNTER — Telehealth: Payer: Self-pay | Admitting: Family Medicine

## 2014-12-17 DIAGNOSIS — M25562 Pain in left knee: Secondary | ICD-10-CM

## 2014-12-17 NOTE — Telephone Encounter (Signed)
Please have her list the things that prevent her from doing jury duty- symptom wise - I have to include those in the letter   Thanks

## 2014-12-17 NOTE — Telephone Encounter (Signed)
Letter in your in box. 

## 2014-12-17 NOTE — Telephone Encounter (Signed)
Pt brought in a jury summons letter. She is asking for an excuse from it. She will need a doctors note. Pt asked if we could send the note and letter in the mail to the address provided on the back of the letter. If not or if you have any questions, call [phone]. Placing ppw in Dr. Lucretia Roers rx slot.

## 2014-12-20 NOTE — Telephone Encounter (Signed)
Dr. Royden Purl letter as well as the letter she dropped off were mailed to pt and pt notified

## 2014-12-20 NOTE — Telephone Encounter (Signed)
Done and in IN box 

## 2014-12-20 NOTE — Telephone Encounter (Signed)
Pt said she has problems walking and once she is sitting it's hard to get up and down, pt had an MRI of her knee on 12/17/14, and she just found out today from her ortho doc that she is going to have to have a knee replacement ASAP because her knee is so bad (MRI results are in EPIC) Pt request once letter is done that we mail it to her sine getting around is difficult right now

## 2015-01-11 ENCOUNTER — Ambulatory Visit (INDEPENDENT_AMBULATORY_CARE_PROVIDER_SITE_OTHER): Payer: Medicare Other | Admitting: Obstetrics & Gynecology

## 2015-01-11 ENCOUNTER — Encounter: Payer: Self-pay | Admitting: Obstetrics & Gynecology

## 2015-01-11 VITALS — BP 178/80 | HR 101 | Resp 16 | Ht 67.0 in | Wt 207.0 lb

## 2015-01-11 DIAGNOSIS — N904 Leukoplakia of vulva: Secondary | ICD-10-CM | POA: Diagnosis not present

## 2015-01-11 NOTE — Progress Notes (Signed)
   Subjective:    Patient ID: Lori Orozco, female    DOB: Aug 08, 1950, 64 y.o.   MRN: [id number]  HPI  64 yo lady with lichen here to see if she needs a vulvar biopsy.   Review of Systems     Objective:   Physical Exam The white area is still present so I am recommending a biopsy again.       Assessment & Plan:  She agrees with the plan for a biopsy but she is having a total knee replacement in 2 weeks and has been told by those surgeons that she should have NO open wounds. She would like to delay this biopsy until after her knee surgery.

## 2015-01-14 ENCOUNTER — Other Ambulatory Visit (HOSPITAL_COMMUNITY): Payer: Self-pay | Admitting: Orthopaedic Surgery

## 2015-01-19 ENCOUNTER — Encounter (HOSPITAL_COMMUNITY): Payer: Self-pay

## 2015-01-19 ENCOUNTER — Encounter (HOSPITAL_COMMUNITY)
Admission: RE | Admit: 2015-01-19 | Discharge: 2015-01-19 | Disposition: A | Discharge status: Home / Self Care | Payer: Medicare Other | Source: Ambulatory Visit | Attending: Orthopaedic Surgery | Admitting: Orthopaedic Surgery

## 2015-01-19 DIAGNOSIS — M179 Osteoarthritis of knee, unspecified: Secondary | ICD-10-CM | POA: Insufficient documentation

## 2015-01-19 DIAGNOSIS — Z01818 Encounter for other preprocedural examination: Secondary | ICD-10-CM | POA: Insufficient documentation

## 2015-01-19 HISTORY — DX: Diverticulitis of intestine, part unspecified, without perforation or abscess without bleeding: K57.92

## 2015-01-19 HISTORY — DX: Dizziness and giddiness: R42

## 2015-01-19 HISTORY — DX: Personal history of other diseases of the respiratory system: Z87.09

## 2015-01-19 HISTORY — DX: Frequency of micturition: R35.0

## 2015-01-19 HISTORY — DX: Lichen sclerosus et atrophicus: L90.0

## 2015-01-19 HISTORY — DX: Urinary tract infection, site not specified: N39.0

## 2015-01-19 HISTORY — DX: Interstitial cystitis (chronic) without hematuria: N30.10

## 2015-01-19 HISTORY — DX: Unspecified osteoarthritis, unspecified site: M19.90

## 2015-01-19 LAB — ABO/RH: ABO/RH(D): A POS

## 2015-01-19 LAB — CBC
HEMATOCRIT: 38 % (ref 36.0–46.0)
Hemoglobin: 12.3 g/dL (ref 12.0–15.0)
MCH: 31.4 pg (ref 26.0–34.0)
MCHC: 32.4 g/dL (ref 30.0–36.0)
MCV: 96.9 fL (ref 78.0–100.0)
Platelets: 220 10*3/uL (ref 150–400)
RBC: 3.92 MIL/uL (ref 3.87–5.11)
RDW: 14 % (ref 11.5–15.5)
WBC: 5.2 10*3/uL (ref 4.0–10.5)

## 2015-01-19 LAB — BASIC METABOLIC PANEL
Anion gap: 9 (ref 5–15)
BUN: 23 mg/dL — AB (ref 6–20)
CHLORIDE: 102 mmol/L (ref 101–111)
CO2: 29 mmol/L (ref 22–32)
Calcium: 9.6 mg/dL (ref 8.9–10.3)
Creatinine, Ser: 1.16 mg/dL — ABNORMAL HIGH (ref 0.44–1.00)
GFR calc Af Amer: 56 mL/min — ABNORMAL LOW (ref >60)
GFR calc non Af Amer: 49 mL/min — ABNORMAL LOW (ref >60)
GLUCOSE: 93 mg/dL (ref 65–99)
POTASSIUM: 4.7 mmol/L (ref 3.5–5.1)
Sodium: 140 mmol/L (ref 135–145)

## 2015-01-19 LAB — PROTIME-INR
INR: 0.92 (ref 0.00–1.49)
PROTHROMBIN TIME: 12.6 s (ref 11.6–15.2)

## 2015-01-19 LAB — SURGICAL PCR SCREEN
MRSA, PCR: NEGATIVE
Staphylococcus aureus: NEGATIVE

## 2015-01-19 LAB — APTT: APTT: 27 s (ref 24–37)

## 2015-01-19 NOTE — Progress Notes (Signed)
BMP results in epic per PAT visit 01/19/2015

## 2015-01-19 NOTE — Patient Instructions (Addendum)
Lori Orozco  01/19/2015 Your procedure is scheduled on: Friday January 28, 2015    Report to [facility] Main  Entrance take [place]  elevators to 3rd floor to  Short Stay Center at 12:45 PM.  Call this number if you have problems the morning of surgery [phone]   Remember: ONLY 1 PERSON MAY GO WITH YOU TO SHORT STAY TO GET  READY MORNING OF YOUR SURGERY.  Do not eat food After Midnight but may take clear liquids till 8:45 am day of surgery then nothing by mouth.      Take these medicines the morning of surgery with A SIP OF WATER: Bupropion (Wellbutrin); Sertraline (Zoloft); Buspirone (Buspar); May use flonase if needed (bring with you day of surgery); May use albuterol inhaler if needed (bring with you day of surgery); Lorazepam (Ativan) if needed; Hydrocodone_Acetaminophen if needed                               You may not have any metal on your body including hair pins and              piercings  Do not wear jewelry, make-up, lotions, powders or perfumes, deodorant             Do not wear nail polish.  Do not shave  48 hours prior to surgery.              Do not bring valuables to the hospital. [place] IS NOT             RESPONSIBLE   FOR VALUABLES.  Contacts, dentures or bridgework may not be worn into surgery.  Leave suitcase in the car. After surgery it may be brought to your room.    Please read over the following fact sheets you were given:MRSA INFORMATION SHEET; INCENTIVE SPIROMETER; BLOOD TRANSFUSION INFORMATION SHEET _____________________________________________________________________             [facility] - Preparing for Surgery Before surgery, you can play an important role.  Because skin is not sterile, your skin needs to be as free of germs as possible.  You can reduce the number of germs on your skin by washing with CHG (chlorahexidine gluconate) soap before surgery.  CHG is an antiseptic cleaner which kills germs and bonds with  the skin to continue killing germs even after washing. Please DO NOT use if you have an allergy to CHG or antibacterial soaps.  If your skin becomes reddened/irritated stop using the CHG and inform your nurse when you arrive at Short Stay. Do not shave (including legs and underarms) for at least 48 hours prior to the first CHG shower.  You may shave your face/neck. Please follow these instructions carefully:  1.  Shower with CHG Soap the night before surgery and the  morning of Surgery.  2.  If you choose to wash your hair, wash your hair first as usual with your  normal  shampoo.  3.  After you shampoo, rinse your hair and body thoroughly to remove the  shampoo.                           4.  Use CHG as you would any other liquid soap.  You can apply chg directly  to the skin and wash  Gently with a scrungie or clean washcloth.  5.  Apply the CHG Soap to your body ONLY FROM THE NECK DOWN.   Do not use on face/ open                           Wound or open sores. Avoid contact with eyes, ears mouth and genitals (private parts).                       Wash face,  Genitals (private parts) with your normal soap.             6.  Wash thoroughly, paying special attention to the area where your surgery  will be performed.  7.  Thoroughly rinse your body with warm water from the neck down.  8.  DO NOT shower/wash with your normal soap after using and rinsing off  the CHG Soap.                9.  Pat yourself dry with a clean towel.            10.  Wear clean pajamas.            11.  Place clean sheets on your bed the night of your first shower and do not  sleep with pets. Day of Surgery : Do not apply any lotions/deodorants the morning of surgery.  Please wear clean clothes to the hospital/surgery center.  FAILURE TO FOLLOW THESE INSTRUCTIONS MAY RESULT IN THE CANCELLATION OF YOUR SURGERY PATIENT SIGNATURE_________________________________  NURSE  SIGNATURE__________________________________  ________________________________________________________________________    CLEAR LIQUID DIET   Foods Allowed                                                                     Foods Excluded  Coffee and tea, regular and decaf                             liquids that you cannot  Plain Jell-O in any flavor                                             see through such as: Fruit ices (not with fruit pulp)                                     milk, soups, orange juice  Iced Popsicles                                    All solid food Carbonated beverages, regular and diet                                    Cranberry, grape and apple juices Sports drinks like Gatorade Lightly seasoned clear broth or consume(fat free) Sugar, honey syrup  Sample Menu Breakfast                                Lunch                                     Supper Cranberry juice                    Beef broth                            Chicken broth Jell-O                                     Grape juice                           Apple juice Coffee or tea                        Jell-O                                      Popsicle                                                Coffee or tea                        Coffee or tea  _____________________________________________________________________    Incentive Spirometer  An incentive spirometer is a tool that can help keep your lungs clear and active. This tool measures how well you are filling your lungs with each breath. Taking long deep breaths may help reverse or decrease the chance of developing breathing (pulmonary) problems (especially infection) following:  A long period of time when you are unable to move or be active. BEFORE THE PROCEDURE   If the spirometer includes an indicator to show your best effort, your nurse or respiratory therapist will set it to a desired goal.  If possible, sit up straight or lean  slightly forward. Try not to slouch.  Hold the incentive spirometer in an upright position. INSTRUCTIONS FOR USE   Sit on the edge of your bed if possible, or sit up as far as you can in bed or on a chair.  Hold the incentive spirometer in an upright position.  Breathe out normally.  Place the mouthpiece in your mouth and seal your lips tightly around it.  Breathe in slowly and as deeply as possible, raising the piston or the ball toward the top of the column.  Hold your breath for 3-5 seconds or for as long as possible. Allow the piston or ball to fall to the bottom of the column.  Remove the mouthpiece from your mouth and breathe out normally.  Rest for a few seconds and repeat Steps 1 through 7 at least 10 times every 1-2 hours when you are awake. Take your time and take a few normal breaths between  deep breaths.  The spirometer may include an indicator to show your best effort. Use the indicator as a goal to work toward during each repetition.  After each set of 10 deep breaths, practice coughing to be sure your lungs are clear. If you have an incision (the cut made at the time of surgery), support your incision when coughing by placing a pillow or rolled up towels firmly against it. Once you are able to get out of bed, walk around indoors and cough well. You may stop using the incentive spirometer when instructed by your caregiver.  RISKS AND COMPLICATIONS  Take your time so you do not get dizzy or light-headed.  If you are in pain, you may need to take or ask for pain medication before doing incentive spirometry. It is harder to take a deep breath if you are having pain. AFTER USE  Rest and breathe slowly and easily.  It can be helpful to keep track of a log of your progress. Your caregiver can provide you with a simple table to help with this. If you are using the spirometer at home, follow these instructions: SEEK MEDICAL CARE IF:   You are having difficultly using the  spirometer.  You have trouble using the spirometer as often as instructed.  Your pain medication is not giving enough relief while using the spirometer.  You develop fever of 100.5 F (38.1 C) or higher. SEEK IMMEDIATE MEDICAL CARE IF:   You cough up bloody sputum that had not been present before.  You develop fever of 102 F (38.9 C) or greater.  You develop worsening pain at or near the incision site. MAKE SURE YOU:   Understand these instructions.  Will watch your condition.  Will get help right away if you are not doing well or get worse. Document Released: 06/25/2006 Document Revised: 05/07/2011 Document Reviewed: 08/26/2006 Exit[employer] Patient Information 2014 [employer], [place].   ________________________________________________________________________  WHAT IS A BLOOD TRANSFUSION? Blood Transfusion Information  A transfusion is the replacement of blood or some of its parts. Blood is made up of multiple cells which provide different functions.  Red blood cells carry oxygen and are used for blood loss replacement.  White blood cells fight against infection.  Platelets control bleeding.  Plasma helps clot blood.  Other blood products are available for specialized needs, such as hemophilia or other clotting disorders. BEFORE THE TRANSFUSION  Who gives blood for transfusions?   Healthy volunteers who are fully evaluated to make sure their blood is safe. This is blood bank blood. Transfusion therapy is the safest it has ever been in the practice of medicine. Before blood is taken from a donor, a complete history is taken to make sure that person has no history of diseases nor engages in risky social behavior (examples are intravenous drug use or sexual activity with multiple partners). The donor's travel history is screened to minimize risk of transmitting infections, such as malaria. The donated blood is tested for signs of infectious diseases, such as HIV and hepatitis.  The blood is then tested to be sure it is compatible with you in order to minimize the chance of a transfusion reaction. If you or a relative donates blood, this is often done in anticipation of surgery and is not appropriate for emergency situations. It takes many days to process the donated blood. RISKS AND COMPLICATIONS Although transfusion therapy is very safe and saves many lives, the main dangers of transfusion include:   Getting an infectious disease.  Developing a transfusion reaction. This is an allergic reaction to something in the blood you were given. Every precaution is taken to prevent this. The decision to have a blood transfusion has been considered carefully by your caregiver before blood is given. Blood is not given unless the benefits outweigh the risks. AFTER THE TRANSFUSION  Right after receiving a blood transfusion, you will usually feel much better and more energetic. This is especially true if your red blood cells have gotten low (anemic). The transfusion raises the level of the red blood cells which carry oxygen, and this usually causes an energy increase.  The nurse administering the transfusion will monitor you carefully for complications. HOME CARE INSTRUCTIONS  No special instructions are needed after a transfusion. You may find your energy is better. Speak with your caregiver about any limitations on activity for underlying diseases you may have. SEEK MEDICAL CARE IF:   Your condition is not improving after your transfusion.  You develop redness or irritation at the intravenous (IV) site. SEEK IMMEDIATE MEDICAL CARE IF:  Any of the following symptoms occur over the next 12 hours:  Shaking chills.  You have a temperature by mouth above 102 F (38.9 C), not controlled by medicine.  Chest, back, or muscle pain.  People around you feel you are not acting correctly or are confused.  Shortness of breath or difficulty breathing.  Dizziness and fainting.  You  get a rash or develop hives.  You have a decrease in urine output.  Your urine turns a dark color or changes to pink, red, or brown. Any of the following symptoms occur over the next 10 days:  You have a temperature by mouth above 102 F (38.9 C), not controlled by medicine.  Shortness of breath.  Weakness after normal activity.  The white part of the eye turns yellow (jaundice).  You have a decrease in the amount of urine or are urinating less often.  Your urine turns a dark color or changes to pink, red, or brown. Document Released: 02/10/2000 Document Revised: 05/07/2011 Document Reviewed: 09/29/2007 [facility] Patient Information 2014 [place], [place].  _______________________________________________________________________

## 2015-01-19 NOTE — Progress Notes (Addendum)
CXR 07/01/2014 epic  ECHO epic 05/23/2007

## 2015-01-28 ENCOUNTER — Encounter (HOSPITAL_COMMUNITY): Payer: Self-pay | Admitting: *Deleted

## 2015-01-28 ENCOUNTER — Inpatient Hospital Stay (HOSPITAL_COMMUNITY): Payer: Medicare Other | Admitting: Anesthesiology

## 2015-01-28 ENCOUNTER — Encounter (HOSPITAL_COMMUNITY)
Admission: RE | Disposition: A | Discharge status: Home Health | Payer: Self-pay | Source: Ambulatory Visit | Attending: Orthopaedic Surgery

## 2015-01-28 ENCOUNTER — Inpatient Hospital Stay (HOSPITAL_COMMUNITY)
Admission: RE | Admit: 2015-01-28 | Discharge: 2015-01-30 | DRG: 470 | Disposition: A | Discharge status: Home Health | Payer: Medicare Other | Source: Ambulatory Visit | Attending: Orthopaedic Surgery | Admitting: Orthopaedic Surgery

## 2015-01-28 ENCOUNTER — Inpatient Hospital Stay (HOSPITAL_COMMUNITY): Payer: Medicare Other

## 2015-01-28 DIAGNOSIS — I739 Peripheral vascular disease, unspecified: Secondary | ICD-10-CM | POA: Diagnosis present

## 2015-01-28 DIAGNOSIS — M1712 Unilateral primary osteoarthritis, left knee: Principal | ICD-10-CM | POA: Diagnosis present

## 2015-01-28 DIAGNOSIS — Z01812 Encounter for preprocedural laboratory examination: Secondary | ICD-10-CM | POA: Diagnosis not present

## 2015-01-28 DIAGNOSIS — E039 Hypothyroidism, unspecified: Secondary | ICD-10-CM | POA: Diagnosis present

## 2015-01-28 DIAGNOSIS — Z87891 Personal history of nicotine dependence: Secondary | ICD-10-CM

## 2015-01-28 DIAGNOSIS — Z96641 Presence of right artificial hip joint: Secondary | ICD-10-CM | POA: Diagnosis present

## 2015-01-28 DIAGNOSIS — M25562 Pain in left knee: Secondary | ICD-10-CM | POA: Diagnosis present

## 2015-01-28 DIAGNOSIS — J449 Chronic obstructive pulmonary disease, unspecified: Secondary | ICD-10-CM | POA: Diagnosis present

## 2015-01-28 DIAGNOSIS — K219 Gastro-esophageal reflux disease without esophagitis: Secondary | ICD-10-CM | POA: Diagnosis present

## 2015-01-28 DIAGNOSIS — K449 Diaphragmatic hernia without obstruction or gangrene: Secondary | ICD-10-CM | POA: Diagnosis present

## 2015-01-28 DIAGNOSIS — J45909 Unspecified asthma, uncomplicated: Secondary | ICD-10-CM | POA: Diagnosis present

## 2015-01-28 DIAGNOSIS — Z96652 Presence of left artificial knee joint: Secondary | ICD-10-CM

## 2015-01-28 HISTORY — PX: TOTAL KNEE ARTHROPLASTY: SHX125

## 2015-01-28 LAB — TYPE AND SCREEN
ABO/RH(D): A POS
Antibody Screen: NEGATIVE

## 2015-01-28 SURGERY — ARTHROPLASTY, KNEE, TOTAL
Anesthesia: Monitor Anesthesia Care | Site: Knee | Laterality: Left

## 2015-01-28 MED ORDER — HYDROMORPHONE HCL 1 MG/ML IJ SOLN: 0.2500 mg | INTRAMUSCULAR | Status: DC | PRN

## 2015-01-28 MED ORDER — METOCLOPRAMIDE HCL 5 MG PO TABS
5.0000 mg | ORAL_TABLET | Freq: Three times a day (TID) | ORAL | Status: DC | PRN
  Filled 2015-01-28: qty 2

## 2015-01-28 MED ORDER — LACTATED RINGERS IV SOLN
INTRAVENOUS | Status: DC
  Administered 2015-01-28: 1000 mL via INTRAVENOUS

## 2015-01-28 MED ORDER — ONDANSETRON HCL 4 MG/2ML IJ SOLN: 4.0000 mg | Freq: Four times a day (QID) | INTRAMUSCULAR | Status: DC | PRN

## 2015-01-28 MED ORDER — MIRTAZAPINE 45 MG PO TABS
45.0000 mg | ORAL_TABLET | Freq: Every day | ORAL | Status: DC
  Administered 2015-01-28 – 2015-01-29 (×2): 45 mg via ORAL
  Filled 2015-01-28 (×3): qty 1

## 2015-01-28 MED ORDER — CLINDAMYCIN PHOSPHATE 900 MG/50ML IV SOLN
900.0000 mg | INTRAVENOUS | Status: AC
  Administered 2015-01-28: 900 mg via INTRAVENOUS

## 2015-01-28 MED ORDER — ACETAMINOPHEN 325 MG PO TABS: 650.0000 mg | ORAL_TABLET | Freq: Four times a day (QID) | ORAL | Status: DC | PRN

## 2015-01-28 MED ORDER — BUPROPION HCL ER (XL) 150 MG PO TB24
150.0000 mg | ORAL_TABLET | Freq: Every day | ORAL | Status: DC
  Administered 2015-01-29 – 2015-01-30 (×2): 150 mg via ORAL
  Filled 2015-01-28 (×2): qty 1

## 2015-01-28 MED ORDER — FENTANYL CITRATE (PF) 100 MCG/2ML IJ SOLN
INTRAMUSCULAR | Status: DC | PRN
  Administered 2015-01-28: 100 ug via INTRAVENOUS

## 2015-01-28 MED ORDER — MIDAZOLAM HCL 2 MG/2ML IJ SOLN
INTRAMUSCULAR | Status: AC
  Filled 2015-01-28: qty 2

## 2015-01-28 MED ORDER — DIVALPROEX SODIUM ER 500 MG PO TB24
500.0000 mg | ORAL_TABLET | Freq: Every evening | ORAL | Status: DC
  Administered 2015-01-28 – 2015-01-29 (×2): 500 mg via ORAL
  Filled 2015-01-28 (×3): qty 1

## 2015-01-28 MED ORDER — BUPIVACAINE HCL (PF) 0.5 % IJ SOLN
INTRAMUSCULAR | Status: AC
  Filled 2015-01-28: qty 30

## 2015-01-28 MED ORDER — MIDAZOLAM HCL 5 MG/5ML IJ SOLN
INTRAMUSCULAR | Status: DC | PRN
  Administered 2015-01-28: 2 mg via INTRAVENOUS

## 2015-01-28 MED ORDER — OXYCODONE HCL 5 MG PO TABS: 5.0000 mg | ORAL_TABLET | Freq: Once | ORAL | Status: DC | PRN

## 2015-01-28 MED ORDER — ACETAMINOPHEN 650 MG RE SUPP
650.0000 mg | Freq: Four times a day (QID) | RECTAL | Status: DC | PRN
  Filled 2015-01-28: qty 1

## 2015-01-28 MED ORDER — BUPIVACAINE HCL (PF) 0.75 % IJ SOLN
INTRAMUSCULAR | Status: DC | PRN
  Administered 2015-01-28: 15 mg via INTRATHECAL

## 2015-01-28 MED ORDER — METOCLOPRAMIDE HCL 5 MG/ML IJ SOLN
5.0000 mg | Freq: Three times a day (TID) | INTRAMUSCULAR | Status: DC | PRN
  Administered 2015-01-28: 10 mg via INTRAVENOUS
  Filled 2015-01-28: qty 2

## 2015-01-28 MED ORDER — HYDROMORPHONE HCL 1 MG/ML IJ SOLN
1.0000 mg | INTRAMUSCULAR | Status: DC | PRN
  Administered 2015-01-28: 1 mg via INTRAVENOUS
  Filled 2015-01-28: qty 1

## 2015-01-28 MED ORDER — BUSPIRONE HCL 15 MG PO TABS
15.0000 mg | ORAL_TABLET | Freq: Two times a day (BID) | ORAL | Status: DC
  Administered 2015-01-29 – 2015-01-30 (×3): 15 mg via ORAL
  Filled 2015-01-28 (×5): qty 1

## 2015-01-28 MED ORDER — METHOCARBAMOL 500 MG PO TABS
500.0000 mg | ORAL_TABLET | Freq: Four times a day (QID) | ORAL | Status: DC | PRN
  Administered 2015-01-28: 500 mg via ORAL
  Filled 2015-01-28: qty 1

## 2015-01-28 MED ORDER — CLINDAMYCIN PHOSPHATE 900 MG/50ML IV SOLN: 900.0000 mg | INTRAVENOUS | Status: DC

## 2015-01-28 MED ORDER — ESTROGENS CONJUGATED 0.625 MG PO TABS
0.6250 mg | ORAL_TABLET | Freq: Every day | ORAL | Status: DC
  Administered 2015-01-29 – 2015-01-30 (×2): 0.625 mg via ORAL
  Filled 2015-01-28 (×2): qty 1

## 2015-01-28 MED ORDER — TRANEXAMIC ACID 1000 MG/10ML IV SOLN
1000.0000 mg | INTRAVENOUS | Status: AC
  Administered 2015-01-28: 1000 mg via INTRAVENOUS
  Filled 2015-01-28: qty 10

## 2015-01-28 MED ORDER — METHOCARBAMOL 1000 MG/10ML IJ SOLN
500.0000 mg | Freq: Four times a day (QID) | INTRAVENOUS | Status: DC | PRN
  Filled 2015-01-28: qty 5

## 2015-01-28 MED ORDER — POLYETHYLENE GLYCOL 3350 17 G PO PACK: 17.0000 g | PACK | Freq: Every day | ORAL | Status: DC | PRN

## 2015-01-28 MED ORDER — OXYCODONE HCL 5 MG/5ML PO SOLN
5.0000 mg | Freq: Once | ORAL | Status: DC | PRN
  Filled 2015-01-28: qty 5

## 2015-01-28 MED ORDER — PROPOFOL 10 MG/ML IV BOLUS
INTRAVENOUS | Status: AC
  Filled 2015-01-28: qty 20

## 2015-01-28 MED ORDER — FENTANYL CITRATE (PF) 100 MCG/2ML IJ SOLN
INTRAMUSCULAR | Status: AC
  Filled 2015-01-28: qty 2

## 2015-01-28 MED ORDER — RIVAROXABAN 10 MG PO TABS
10.0000 mg | ORAL_TABLET | Freq: Every day | ORAL | Status: DC
  Administered 2015-01-29 – 2015-01-30 (×2): 10 mg via ORAL
  Filled 2015-01-28 (×3): qty 1

## 2015-01-28 MED ORDER — SODIUM CHLORIDE 0.9 % IR SOLN
Status: DC | PRN
  Administered 2015-01-28: 2000 mL

## 2015-01-28 MED ORDER — PROPOFOL 10 MG/ML IV BOLUS
INTRAVENOUS | Status: AC
  Filled 2015-01-28: qty 40

## 2015-01-28 MED ORDER — VITAMIN D3 25 MCG (1000 UNIT) PO TABS
2000.0000 [IU] | ORAL_TABLET | Freq: Every day | ORAL | Status: DC
  Administered 2015-01-28 – 2015-01-30 (×3): 2000 [IU] via ORAL
  Filled 2015-01-28 (×3): qty 2

## 2015-01-28 MED ORDER — MENTHOL 3 MG MT LOZG: 1.0000 | LOZENGE | OROMUCOSAL | Status: DC | PRN

## 2015-01-28 MED ORDER — VITAMIN B-12 1000 MCG PO TABS
2000.0000 ug | ORAL_TABLET | Freq: Every day | ORAL | Status: DC
Start: 2015-01-29 — End: 2015-01-30
  Administered 2015-01-29 – 2015-01-30 (×2): 2000 ug via ORAL
  Filled 2015-01-28 (×2): qty 2

## 2015-01-28 MED ORDER — PROMETHAZINE HCL 25 MG/ML IJ SOLN: 6.2500 mg | INTRAMUSCULAR | Status: DC | PRN

## 2015-01-28 MED ORDER — KETOROLAC TROMETHAMINE 15 MG/ML IJ SOLN
7.5000 mg | Freq: Four times a day (QID) | INTRAMUSCULAR | Status: AC
  Administered 2015-01-28 – 2015-01-29 (×4): 7.5 mg via INTRAVENOUS
  Filled 2015-01-28 (×4): qty 1

## 2015-01-28 MED ORDER — LORAZEPAM 1 MG PO TABS
1.0000 mg | ORAL_TABLET | Freq: Two times a day (BID) | ORAL | Status: DC | PRN
  Administered 2015-01-29: 1 mg via ORAL
  Filled 2015-01-28: qty 1

## 2015-01-28 MED ORDER — PHENOL 1.4 % MT LIQD: 1.0000 | OROMUCOSAL | Status: DC | PRN

## 2015-01-28 MED ORDER — DOCUSATE SODIUM 100 MG PO CAPS
100.0000 mg | ORAL_CAPSULE | Freq: Two times a day (BID) | ORAL | Status: DC
  Administered 2015-01-28 – 2015-01-30 (×4): 100 mg via ORAL

## 2015-01-28 MED ORDER — OXYCODONE HCL 5 MG PO TABS
5.0000 mg | ORAL_TABLET | ORAL | Status: DC | PRN
  Administered 2015-01-28 – 2015-01-29 (×5): 10 mg via ORAL
  Filled 2015-01-28 (×5): qty 2

## 2015-01-28 MED ORDER — CLINDAMYCIN PHOSPHATE 600 MG/50ML IV SOLN
600.0000 mg | Freq: Four times a day (QID) | INTRAVENOUS | Status: AC
  Administered 2015-01-28 – 2015-01-29 (×2): 600 mg via INTRAVENOUS
  Filled 2015-01-28 (×2): qty 50

## 2015-01-28 MED ORDER — ALBUTEROL SULFATE (2.5 MG/3ML) 0.083% IN NEBU: 3.0000 mL | INHALATION_SOLUTION | RESPIRATORY_TRACT | Status: DC | PRN

## 2015-01-28 MED ORDER — SIMVASTATIN 20 MG PO TABS
20.0000 mg | ORAL_TABLET | Freq: Every day | ORAL | Status: DC
  Administered 2015-01-28 – 2015-01-29 (×2): 20 mg via ORAL
  Filled 2015-01-28 (×3): qty 1

## 2015-01-28 MED ORDER — ONDANSETRON HCL 4 MG PO TABS: 4.0000 mg | ORAL_TABLET | Freq: Four times a day (QID) | ORAL | Status: DC | PRN

## 2015-01-28 MED ORDER — PROPOFOL 500 MG/50ML IV EMUL
INTRAVENOUS | Status: DC | PRN
  Administered 2015-01-28: 25 ug/kg/min via INTRAVENOUS

## 2015-01-28 MED ORDER — SERTRALINE HCL 100 MG PO TABS
100.0000 mg | ORAL_TABLET | Freq: Every day | ORAL | Status: DC
  Administered 2015-01-29 – 2015-01-30 (×2): 100 mg via ORAL
  Filled 2015-01-28 (×2): qty 1

## 2015-01-28 MED ORDER — FLUTICASONE PROPIONATE 50 MCG/ACT NA SUSP
2.0000 | Freq: Every day | NASAL | Status: DC | PRN
  Filled 2015-01-28: qty 16

## 2015-01-28 MED ORDER — CLINDAMYCIN PHOSPHATE 900 MG/50ML IV SOLN
INTRAVENOUS | Status: AC
  Filled 2015-01-28: qty 50

## 2015-01-28 MED ORDER — DIPHENHYDRAMINE HCL 12.5 MG/5ML PO ELIX
12.5000 mg | ORAL_SOLUTION | ORAL | Status: DC | PRN
  Filled 2015-01-28: qty 10

## 2015-01-28 MED ORDER — LEVOTHYROXINE SODIUM 50 MCG PO TABS
50.0000 ug | ORAL_TABLET | Freq: Every day | ORAL | Status: DC
  Administered 2015-01-28 – 2015-01-29 (×2): 50 ug via ORAL
  Filled 2015-01-28 (×3): qty 1

## 2015-01-28 SURGICAL SUPPLY — 51 items
APL SKNCLS STERI-STRIP NONHPOA (GAUZE/BANDAGES/DRESSINGS)
BAG SPEC THK2 15X12 ZIP CLS (MISCELLANEOUS)
BAG ZIPLOCK 12X15 (MISCELLANEOUS) IMPLANT
BANDAGE ELASTIC 6 VELCRO ST LF (GAUZE/BANDAGES/DRESSINGS) ×3 IMPLANT
BENZOIN TINCTURE PRP APPL 2/3 (GAUZE/BANDAGES/DRESSINGS) IMPLANT
BLADE SAG 13.0X1.37X90 (BLADE) IMPLANT
BLADE SAG 18X100X1.27 (BLADE) IMPLANT
BOWL SMART MIX CTS (DISPOSABLE) ×3 IMPLANT
CAPT KNEE TOTAL 3 ×2 IMPLANT
CEMENT BONE 1-PACK (Cement) ×10 IMPLANT
CLOSURE WOUND 1/2 X4 (GAUZE/BANDAGES/DRESSINGS)
CLOTH BEACON ORANGE TIMEOUT ST (SAFETY) ×3 IMPLANT
CUFF TOURN SGL QUICK 34 (TOURNIQUET CUFF) ×3
CUFF TRNQT CYL 34X4X40X1 (TOURNIQUET CUFF) ×1 IMPLANT
DRAPE U-SHAPE 47X51 STRL (DRAPES) ×3 IMPLANT
DRSG AQUACEL AG ADV 3.5X10 (GAUZE/BANDAGES/DRESSINGS) ×1 IMPLANT
DRSG PAD ABDOMINAL 8X10 ST (GAUZE/BANDAGES/DRESSINGS) ×1 IMPLANT
DURAPREP 26ML APPLICATOR (WOUND CARE) ×3 IMPLANT
ELECT REM PT RETURN 9FT ADLT (ELECTROSURGICAL) ×3
ELECTRODE REM PT RTRN 9FT ADLT (ELECTROSURGICAL) ×1 IMPLANT
GAUZE SPONGE 4X4 12PLY STRL (GAUZE/BANDAGES/DRESSINGS) ×3 IMPLANT
GAUZE XEROFORM 1X8 LF (GAUZE/BANDAGES/DRESSINGS) ×2 IMPLANT
GLOVE BIO SURGEON STRL SZ7.5 (GLOVE) ×3 IMPLANT
GLOVE BIOGEL PI IND STRL 8 (GLOVE) ×2 IMPLANT
GLOVE BIOGEL PI INDICATOR 8 (GLOVE) ×4
GLOVE ECLIPSE 8.0 STRL XLNG CF (GLOVE) ×3 IMPLANT
GOWN STRL REUS W/TWL XL LVL3 (GOWN DISPOSABLE) ×6 IMPLANT
HANDPIECE INTERPULSE COAX TIP (DISPOSABLE) ×3
IMMOBILIZER KNEE 20 (SOFTGOODS) ×3
IMMOBILIZER KNEE 20 THIGH 36 (SOFTGOODS) ×1 IMPLANT
NS IRRIG 1000ML POUR BTL (IV SOLUTION) ×3 IMPLANT
PACK TOTAL KNEE CUSTOM (KITS) ×3 IMPLANT
PADDING CAST COTTON 6X4 STRL (CAST SUPPLIES) ×4 IMPLANT
POSITIONER SURGICAL ARM (MISCELLANEOUS) ×3 IMPLANT
SET HNDPC FAN SPRY TIP SCT (DISPOSABLE) ×1 IMPLANT
SET PAD KNEE POSITIONER (MISCELLANEOUS) ×3 IMPLANT
STAPLER VISISTAT 35W (STAPLE) IMPLANT
STRIP CLOSURE SKIN 1/2X4 (GAUZE/BANDAGES/DRESSINGS) IMPLANT
SUCTION FRAZIER 12FR DISP (SUCTIONS) ×3 IMPLANT
SUT MNCRL AB 4-0 PS2 18 (SUTURE) IMPLANT
SUT VIC AB 0 CT1 27 (SUTURE) ×3
SUT VIC AB 0 CT1 27XBRD ANTBC (SUTURE) ×1 IMPLANT
SUT VIC AB 1 CT1 27 (SUTURE) ×6
SUT VIC AB 1 CT1 27XBRD ANTBC (SUTURE) ×2 IMPLANT
SUT VIC AB 2-0 CT1 27 (SUTURE) ×6
SUT VIC AB 2-0 CT1 TAPERPNT 27 (SUTURE) ×2 IMPLANT
TRAY FOLEY W/METER SILVER 14FR (SET/KITS/TRAYS/PACK) ×3 IMPLANT
TRAY FOLEY W/METER SILVER 16FR (SET/KITS/TRAYS/PACK) ×1 IMPLANT
WATER STERILE IRR 1500ML POUR (IV SOLUTION) ×3 IMPLANT
WRAP KNEE MAXI GEL POST OP (GAUZE/BANDAGES/DRESSINGS) ×3 IMPLANT
YANKAUER SUCT BULB TIP 10FT TU (MISCELLANEOUS) ×3 IMPLANT

## 2015-01-28 NOTE — Brief Op Note (Signed)
01/28/2015  3:40 PM  PATIENT:  Lori Orozco  64 y.o. female  PRE-OPERATIVE DIAGNOSIS:  severe osteoarthritis left knee  POST-OPERATIVE DIAGNOSIS:  severe osteoarthritis left knee  PROCEDURE:  Procedure(s): LEFT TOTAL KNEE ARTHROPLASTY (Left)  SURGEON:  Surgeon(s) and Role:    * [name], MD - Primary  PHYSICIAN ASSISTANT: Rexene Edison, PA-C  ANESTHESIA:   spinal  EBL:  Total I/O In: 1000 [I.V.:1000] Out: 150 [Urine:100; Blood:50]  BLOOD ADMINISTERED:none  DRAINS: none   LOCAL MEDICATIONS USED:  NONE  SPECIMEN:  No Specimen  DISPOSITION OF SPECIMEN:  N/A  COUNTS:  YES  TOURNIQUET:   Total Tourniquet Time Documented: Thigh (Left) - 49 minutes Total: Thigh (Left) - 49 minutes   DICTATION: .Other Dictation: Dictation Number [id number]  PLAN OF CARE: Admit to inpatient   PATIENT DISPOSITION:  PACU - hemodynamically stable.   Delay start of Pharmacological VTE agent (>24hrs) due to surgical blood loss or risk of bleeding: no

## 2015-01-28 NOTE — Anesthesia Preprocedure Evaluation (Addendum)
Anesthesia Evaluation  Patient identified by MRN, date of birth, ID band Patient awake    Reviewed: Allergy & Precautions, NPO status , Patient's Chart, lab work & pertinent test results  History of Anesthesia Complications (+) PONV  Airway Mallampati: II  TM Distance: >3 FB Neck ROM: Full    Dental   Pulmonary asthma , COPD, former smoker,    breath sounds clear to auscultation       Cardiovascular + Peripheral Vascular Disease   Rhythm:Regular Rate:Normal     Neuro/Psych  Headaches, Anxiety Depression Bipolar Disorder    GI/Hepatic Neg liver ROS, hiatal hernia, GERD  ,  Endo/Other  Hypothyroidism   Renal/GU Renal disease     Musculoskeletal   Abdominal   Peds  Hematology negative hematology ROS (+)   Anesthesia Other Findings   Reproductive/Obstetrics                            Lab Results  Component Value Date   WBC 5.2 01/19/2015   HGB 12.3 01/19/2015   HCT 38.0 01/19/2015   MCV 96.9 01/19/2015   PLT 220 01/19/2015   Lab Results  Component Value Date   CREATININE 1.16* 01/19/2015   BUN 23* 01/19/2015   NA 140 01/19/2015   K 4.7 01/19/2015   CL 102 01/19/2015   CO2 29 01/19/2015    Anesthesia Physical Anesthesia Plan  ASA: III  Anesthesia Plan: Spinal and MAC   Post-op Pain Management:    Induction: Intravenous  Airway Management Planned: Simple Face Mask and Natural Airway  Additional Equipment:   Intra-op Plan:   Post-operative Plan:   Informed Consent: I have reviewed the patients History and Physical, chart, labs and discussed the procedure including the risks, benefits and alternatives for the proposed anesthesia with the patient or authorized representative who has indicated his/her understanding and acceptance.     Plan Discussed with: CRNA  Anesthesia Plan Comments:         Anesthesia Quick Evaluation

## 2015-01-28 NOTE — H&P (Signed)
TOTAL KNEE ADMISSION H&P  Patient is being admitted for left total knee arthroplasty.  Subjective:  Chief Complaint:left knee pain.  HPI: Lori Orozco, 64 y.o. female, has a history of pain and functional disability in the left knee due to arthritis and has failed non-surgical conservative treatments for greater than 12 weeks to includeNSAID's and/or analgesics, corticosteriod injections, viscosupplementation injections, flexibility and strengthening excercises and activity modification.  Onset of symptoms was gradual, starting 5 years ago with gradually worsening course since that time. The patient noted prior procedures on the knee to include  arthroscopy on the left knee(s).  Patient currently rates pain in the left knee(s) at 10 out of 10 with activity. Patient has night pain, worsening of pain with activity and weight bearing, pain that interferes with activities of daily living, pain with passive range of motion, crepitus and joint swelling.  Patient has evidence of subchondral sclerosis, periarticular osteophytes and joint space narrowing by imaging studies. There is no active infection.  Patient Active Problem List   Diagnosis Date Noted  . Osteoarthritis of left knee 01/28/2015  . Vitamin D deficiency 04/27/2014  . Acute bacterial sinusitis 06/17/2013  . Seasonal and perennial allergic rhinitis 06/17/2013  . S/P total hip arthroplasty 03/20/2013  . 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 Encounter for Medicare annual wellness exam 11/18/2012  . History of falling 11/18/2012  . Routine general medical examination at a health care facility 11/10/2012  . 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 COPD exacerbation (HCC) 11/07/2011  . Osteopenia 10/19/2011  . Other screening mammogram 10/19/2011  . Hypothyroid 10/19/2011  . Acute bronchitis with chronic obstructive pulmonary disease (COPD) (HCC) 07/17/2011  . Tobacco use disorder 03/07/2010  .  ANEMIA 10/11/2009  . PERIPHERAL NEUROPATHY, FEET 08/05/2009  . BACK PAIN 08/05/2009  . HAND PAIN, BILATERAL 08/05/2009  . TREMOR 03/09/2008  . MENOPAUSAL SYNDROME 10/09/2007  . DEPRESSION, MAJOR 05/20/2007  . BIPOLAR AFFECTIVE DISORDER 05/20/2007  . ANXIETY 05/20/2007  . 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 History of Guillain-Barre syndrome 05/20/2007  . ESOPHAGEAL SPASM 05/20/2007  . HIATAL HERNIA 05/20/2007  . AMAUROSIS FUGAX 05/07/2007  . COPD with chronic bronchitis (HCC) 03/27/2007  . HYPERCHOLESTEROLEMIA, PURE 12/10/2006  . SYMPTOM, SYNDROME, CHRONIC FATIGUE 12/10/2006   Past Medical History  Diagnosis Date  . COPD (chronic obstructive pulmonary disease) (HCC)   . Chronic fatigue syndrome   . Hyperlipidemia   . Anxiety   . Depression   . Irritable bowel syndrome   . Migraines   . Amaurosis fugax   . Asthma   . Cataract   . Emphysema of lung (HCC)   . Thyroid disease     [place]  . Lumbar herniated disc   . PONV (postoperative nausea and vomiting)   . Hypothyroidism   . Pneumonia   . Chronic kidney disease     frequency, nephritis when 64 yrs old  . GERD (gastroesophageal reflux disease)     occ  . H/O hiatal hernia   . Fibromyalgia   . Anemia     hx  . Shortness of breath     exertion   . History of bronchitis   . Urinary tract infection   . Urinary frequency   . Arthritis   . Diverticulitis   . Vertigo   . Lichen sclerosus   . Interstitial cystitis     Past Surgical History  Procedure Laterality Date  . Temporomandibular joint surgery    .  Abdominal hysterectomy    . Dexa-osteopenia    . Epicondylitis    . Foot surgery Bilateral   . Carotid dopplar    . Doppler echocardiography    . Tonsillectomy    . Knee surgery      Left cartilage  . Trigger finger release Right   . Colonoscopy  05/26/2010    avms- otherwise nl , re check 10y  . Plantar fascia surgery Left   . Wrist arthroscopy Right     ligament tear  . Eye surgery Bilateral      cataracts  . Total hip arthroplasty Right 03/20/2013    Procedure: Right TOTAL HIP ARTHROPLASTY;  Surgeon: [name], MD;  Location: [facility] OR;  Service: Orthopedics;  Laterality: Right;  Right Total Hip Arthroplasty  . Bladder sugery     . Lipoma in second finger right hand      No prescriptions prior to admission   Allergies  Allergen Reactions  . Codeine Nausea Only    Makes pt stay awake  . Cymbalta [Duloxetine Hcl] Other (See Comments)    Makes pt pass out   . Erythromycin     REACTION: abdominal pain  . Lithium Swelling  . Lyrica [Pregabalin]     Felt faint  . Neurontin [Gabapentin]     Passes  out  . Rabeprazole Sodium     REACTION: insomnia  . Tegretol [Carbamazepine] Other (See Comments)    Fever and body aches   . Duraprep [employer], Misc.] Itching and Rash    Tolerates Betadine   . Penicillins Rash    Has patient had a PCN reaction causing immediate rash, facial/tongue/throat swelling, SOB or lightheadedness with hypotension: Yes Has patient had a PCN reaction causing severe rash involving mucus membranes or skin necrosis: No Has patient had a PCN reaction that required hospitalization No Has patient had a PCN reaction occurring within the last 10 years: No If all of the above answers are "NO", then may proceed with Cephalosporin use.   . Tape Rash    PT ALLERGIC NYLON TAPE     Social History  Substance Use Topics  . Smoking status: Former Smoker -- 2.00 packs/day for 43 years    Types: Cigarettes    Quit date: 02/26/2013  . Smokeless tobacco: Never Used     Comment: "vaping now"  . Alcohol Use: No    Family History  Problem Relation Age of Onset  . Thyroid disease Mother   . Hypertension Mother   . Kidney disease Mother   . Cancer Father   . Colon cancer Other      Review of Systems  Musculoskeletal: Positive for joint pain.  All other systems reviewed and are negative.   Objective:  Physical Exam  Constitutional: She is oriented  to person, place, and time. She appears well-developed and well-nourished.  HENT:  Head: Normocephalic and atraumatic.  Eyes: EOM are normal. Pupils are equal, round, and reactive to light.  Neck: Normal range of motion. Neck supple.  Cardiovascular: Normal rate and regular rhythm.   Respiratory: Effort normal and breath sounds normal.  GI: Soft. Bowel sounds are normal.  Musculoskeletal:       Left knee: She exhibits decreased range of motion, effusion and bony tenderness. Tenderness found. Medial joint line and lateral joint line tenderness noted.  Neurological: She is alert and oriented to person, place, and time.  Skin: Skin is warm and dry.  Psychiatric: She has a normal mood and  affect.    Vital signs in last 24 hours:    Labs:   Estimated body mass index is 32.41 kg/(m^2) as calculated from the following:   Height as of 10/18/14: 5\' 7"  (1.702 m).   Weight as of 10/18/14: 93.895 kg (207 lb).   Imaging Review Plain radiographs demonstrate moderate degenerative joint disease of the left knee(s). The overall alignment isneutral. The bone quality appears to be excellent for age and reported activity level.  Assessment/Plan:  End stage arthritis, left knee   The patient history, physical examination, clinical judgment of the provider and imaging studies are consistent with end stage degenerative joint disease of the left knee(s) and total knee arthroplasty is deemed medically necessary. The treatment options including medical management, injection therapy arthroscopy and arthroplasty were discussed at length. The risks and benefits of total knee arthroplasty were presented and reviewed. The risks due to aseptic loosening, infection, stiffness, patella tracking problems, thromboembolic complications and other imponderables were discussed. The patient acknowledged the explanation, agreed to proceed with the plan and consent was signed. Patient is being admitted for inpatient treatment  for surgery, pain control, PT, OT, prophylactic antibiotics, VTE prophylaxis, progressive ambulation and ADL's and discharge planning. The patient is planning to be discharged home with home health services

## 2015-01-28 NOTE — Transfer of Care (Signed)
Immediate Anesthesia Transfer of Care Note  Patient: Lori Orozco  Procedure(s) Performed: Procedure(s): LEFT TOTAL KNEE ARTHROPLASTY (Left)  Patient Location: PACU  Anesthesia Type:Spinal  Level of Consciousness: sedated  Airway & Oxygen Therapy: Patient Spontanous Breathing and Patient connected to face mask oxygen  Post-op Assessment: Report given to RN and Post -op Vital signs reviewed and stable  Post vital signs: Reviewed and stable  Last Vitals:  Filed Vitals:   01/28/15 1210  BP: 148/78  Pulse: 108  Temp: 37 C  Resp: 20    Complications: No apparent anesthesia complications

## 2015-01-28 NOTE — Anesthesia Procedure Notes (Signed)
Spinal Patient location during procedure: OR Start time: 01/28/2015 2:20 PM End time: 01/28/2015 2:22 PM Staffing Resident/CRNA: Harle Stanford R Performed by: resident/CRNA  Preanesthetic Checklist Completed: patient identified, site marked, surgical consent, pre-op evaluation, timeout performed, IV checked, risks and benefits discussed and monitors and equipment checked Spinal Block Patient position: sitting Prep: Betadine Patient monitoring: heart rate, cardiac monitor, continuous pulse ox and blood pressure Approach: midline Location: L3-4 Injection technique: single-shot Needle Needle type: Sprotte  Needle gauge: 24 G Needle length: 10 cm Needle insertion depth: 7 cm Assessment Sensory level: T6 Additional Notes Timeout performed. Spinal kit date checked. SAB without difficulty

## 2015-01-29 LAB — BASIC METABOLIC PANEL
ANION GAP: 7 (ref 5–15)
BUN: 20 mg/dL (ref 6–20)
CALCIUM: 8.6 mg/dL — AB (ref 8.9–10.3)
CHLORIDE: 103 mmol/L (ref 101–111)
CO2: 28 mmol/L (ref 22–32)
CREATININE: 0.96 mg/dL (ref 0.44–1.00)
GFR calc Af Amer: >60 mL/min (ref >60)
GFR calc non Af Amer: >60 mL/min (ref >60)
GLUCOSE: 138 mg/dL — AB (ref 65–99)
Potassium: 4.5 mmol/L (ref 3.5–5.1)
Sodium: 138 mmol/L (ref 135–145)

## 2015-01-29 LAB — CBC
HEMATOCRIT: 31.3 % — AB (ref 36.0–46.0)
HEMOGLOBIN: 10.5 g/dL — AB (ref 12.0–15.0)
MCH: 33 pg (ref 26.0–34.0)
MCHC: 33.5 g/dL (ref 30.0–36.0)
MCV: 98.4 fL (ref 78.0–100.0)
Platelets: 181 10*3/uL (ref 150–400)
RBC: 3.18 MIL/uL — ABNORMAL LOW (ref 3.87–5.11)
RDW: 14 % (ref 11.5–15.5)
WBC: 6.7 10*3/uL (ref 4.0–10.5)

## 2015-01-29 MED ORDER — METHOCARBAMOL 500 MG PO TABS: 500.0000 mg | ORAL_TABLET | Freq: Four times a day (QID) | ORAL | Status: DC | PRN

## 2015-01-29 MED ORDER — OXYCODONE HCL 5 MG PO TABS: 5.0000 mg | ORAL_TABLET | ORAL | Status: DC | PRN

## 2015-01-29 MED ORDER — DOCUSATE SODIUM 100 MG PO CAPS: 100.0000 mg | ORAL_CAPSULE | Freq: Two times a day (BID) | ORAL | Status: DC | PRN

## 2015-01-29 MED ORDER — RIVAROXABAN 10 MG PO TABS
10.0000 mg | ORAL_TABLET | Freq: Every day | ORAL | Status: DC
Start: 2015-01-29 — End: 2015-04-26

## 2015-01-29 NOTE — Op Note (Signed)
Lori Orozco, Lori Orozco NO.:  [id number]  MEDICAL RECORD NO.:  [id number]  LOCATION:  1608                         FACILITY:  [facility]  PHYSICIAN:  Vanita Panda. [name], M.D.DATE OF BIRTH:  23-Jun-1950  DATE OF PROCEDURE:  01/28/2015 DATE OF DISCHARGE:                              OPERATIVE REPORT   PREOPERATIVE DIAGNOSIS:  Primary osteoarthritis and degenerative joint disease, left knee.  POSTOPERATIVE DIAGNOSIS:  Primary osteoarthritis and degenerative joint disease, left knee.  PROCEDURE:  Left total knee arthroplasty.  IMPLANTS:  Stryker Triathlon knee with size 3 femur, size 3 tibial tray, 9-mm fix bearing polyethylene insert, size 29 patellar button.  SURGEON:  Vanita Panda. [name], MD.  ASSISTANT:  Richardean Canal, P.A.  ANESTHESIA:  Spinal.  ANTIBIOTICS:  900 mg of IV clindamycin.  TOURNIQUET TIME:  Less than 1 hour.  BLOOD LOSS:  100 mL.  COMPLICATIONS:  None.  INDICATIONS:  Lori Orozco is a 64 year old very active female with debilitating arthritis involving her left knee.  An MRI shows a complete loss of cartilage on the patellofemoral joint, the lateral joint line and medial.  With that being said, she has recurrent effusions of her knee, her pain is daily that is affecting her mobility and her quality of life detrimentally.  At this point, with failure of conservative treatment, she wished to proceed with a total knee arthroplasty.  She understands the risk of acute blood loss anemia, nerve and vessel injury, fracture, infection, and DVT.  She understands our goals are decreased pain, improved mobility, and overall improved quality of life.  PROCEDURE DESCRIPTION:  After informed consent was obtained, appropriate left knee was marked, she was brought to the operating room where spinal anesthesia was obtained.  She was then laid supine on the operating table.  A nonsterile tourniquet was placed around her upper left thigh, and her  right leg was prepped and draped with ChloraPrep and sterile drapes including sterile stockinette.  A time-out was called to identify correct patient, correct left knee.  We then used an Esmarch to wrap out the leg and tourniquet was inflated to 300 mm of pressure.  We made a direct midline incision over the patella and carried this proximally and distally.  We dissected down the knee joint and carried out a medial parapatellar arthrotomy.  We found a large joint effusion and complete cartilage loss throughout the knee.  With the knee in a flexed position, we used the extramedullary cutting guide for the tibia, correction of her varus and valgus in a neutral slope to take 9 mm off the high side. We made this cut without difficulty.  Using an intramedullary guide for the femur and based off [place] line with the epicondylar axis,  we were able to set our distal femoral cut at 8 mm based off the cutting jig also at 5 degrees externally rotated for the left knee.  We made our distal femoral cut without problem.  We then brought the knee back down in extension and with the knee in full extension, we used a 9-mm extension block and we had achieved full extension.  We then went back to the femur and put our femoral sizing guide and chose  a size 3 femur. We placed our 4 in 1 cutting block for a size 3 femur and made our anterior-posterior cuts followed by our chamfer cuts.  We then made our femoral box cut.  We then went back to the tibia and then chose a size 3 tibial tray.  We made our keel cut off this.  With the size 3 tibial tray and the size 3 femur, we placed a 9-mm trial polyethylene insert, and we were pleased with our hip flexion-extension gaps, range of motion and stability.  We then made our patellar cut and drilled 3 holes for a size 29 asymmetric patellar button.  With all trial components in place, again we were pleased with range of motion and stability of the knee. We then  removed all trial components and irrigated the knee with normal saline solution using pulsatile lavage.  We mixed our cement and cemented the real Stryker Triathlon tibial tray size 3, followed by the real size 3 femur.  We placed the real 9-mm fix bearing polyethylene insert and cemented the 29 patellar button.  Once our cement had hardened, we let the tourniquet down and hemostasis was obtained with electrocautery.  We then irrigated the knee again with normal saline solution using pulsatile lavage.  We then closed our arthrotomy with interrupted #1 Vicryl suture, followed by 0 Vicryl in the deep tissue, 2- 0 Vicryl in subcutaneous tissue, and interrupted staples on the skin. Well-padded sterile dressing was applied.  She was taken to the recovery room in stable condition.  All final counts were correct.  There were no complications noted.  Of note, Lori Edison PA-C assisted me in the entire case.  His assistance was crucial for facilitating all aspects of this case.     Vanita Panda. [name], M.D.     CYB/MEDQ  D:  01/28/2015  T:  01/29/2015  Job:  [id number]

## 2015-01-29 NOTE — Progress Notes (Signed)
Occupational Therapy Evaluation Patient Details Name: Lori Orozco MRN: [id number] DOB: 1950/03/24 Today's Date: 01/29/2015    History of Present Illness Pt is a 64 year old female s/p L TKA with hx of R THA   Clinical Impression   Limited OT evaluation due to patient politely declining OOB due to just getting comfortable in bed after PT session. Patient reports no need to review LB bathing/dressing as she has AE at home and remembers how to use it. Patient also plans to sponge bathe initially. OT will follow for toilet transfer/toileting education.    Follow Up Recommendations  No OT follow up;Supervision - Intermittent    Equipment Recommendations  None recommended by OT    Recommendations for Other Services       Precautions / Restrictions Precautions Precautions: Knee Required Braces or Orthoses: Knee Immobilizer - Left Restrictions Other Position/Activity Restrictions: WBAT      Mobility Bed Mobility             Transfers                 Balance                                            ADL Overall ADL's : Needs assistance/impaired Eating/Feeding: Independent   Grooming: Set up   Upper Body Bathing: Set up   Lower Body Bathing: Minimal assistance   Upper Body Dressing : Set up   Lower Body Dressing: Minimal assistance;Moderate assistance                 General ADL Comments: Patient received in bed; reports no pain due to being still; icing her knee. Educated patient on 3 in 1 over toilet and she reports she has one at home. She just got foley catheter out and does not yet feel the urge to urinate. Prefers to practice [facility] transfer when she needs to urinate. Discussed tub transfer; patient reports she will sponge bathe until she feels confident about getting in the tub and plans to borrow tub bench from a family member. For LB bathing/dressing, patient reports she has reacher/long sponge/sock aid/long shoe  horn and does not need a review on how to use this AE. Patient politely declined OOB activities due to just getting comfortable after PT session.Will follow up with patient tomorrow to see if any further OT needs.     Vision     Perception     Praxis      Pertinent Vitals/Pain Pain Assessment: 0-10 Pain Score: 0-No pain Pain Location: L knee with ambulation however reports none at rest Pain Descriptors / Indicators: Sore;Sharp Pain Intervention(s): Limited activity within patient's tolerance;Monitored during session;Repositioned;Ice applied     Hand Dominance     Extremity/Trunk Assessment Upper Extremity Assessment Upper Extremity Assessment: Overall WFL for tasks assessed   Lower Extremity Assessment Lower Extremity Assessment: Defer to PT evaluation        Communication Communication Communication: No difficulties   Cognition Arousal/Alertness: Awake/alert Behavior During Therapy: WFL for tasks assessed/performed Overall Cognitive Status: Within Functional Limits for tasks assessed                     General Comments       Exercises      Shoulder Instructions      Home Living Family/patient expects to be discharged to::  Private residence Living Arrangements: Non-relatives/Friends Available Help at Discharge: Family;Friend(s) Type of Home: House Home Access: Level entry     Home Layout: One level     Bathroom Shower/Tub: Chief Strategy Officer: Standard Bathroom Accessibility: Yes How Accessible: Accessible via walker Home Equipment: Walker - 2 wheels;Bedside commode (will borrow tub bench from family member)   Additional Comments: pt reports her sister is going to stay with her upon d/c and assist      Prior Functioning/Environment Level of Independence: Independent             OT Diagnosis: Acute pain   OT Problem List: Decreased strength;Decreased range of motion;Decreased activity tolerance;Pain   OT  Treatment/Interventions: Self-care/ADL training;DME and/or AE instruction;Therapeutic activities;Patient/family education    OT Goals(Current goals can be found in the care plan section) Acute Rehab OT Goals Patient Stated Goal: none stated OT Goal Formulation: With patient Time For Goal Achievement: 02/12/15 Potential to Achieve Goals: Good  OT Frequency: Min 2X/week   Barriers to D/C:            Co-evaluation              End of Session    Activity Tolerance: Patient tolerated treatment well Patient left: in bed;with call bell/phone within reach;with family/visitor present   Time: 3276-1470 OT Time Calculation (min): 17 min Charges:  OT General Charges $OT Visit: 1 Procedure OT Evaluation $Initial OT Evaluation Tier I: 1 Procedure G-Codes:    Kerby Borner A 02-07-2015, 2:36 PM

## 2015-01-29 NOTE — Progress Notes (Signed)
Physical Therapy Treatment Note    01/29/15 1336  PT Visit Information  Last PT Received On 01/29/15  Assistance Needed +1  History of Present Illness Pt is a 64 year old female s/p L TKA with hx of R THA  PT Time Calculation  PT Start Time (ACUTE ONLY) 1318  PT Stop Time (ACUTE ONLY) 1344  PT Time Calculation (min) (ACUTE ONLY) 26 min  Subjective Data  Subjective Pt ambulated in hallway and performed LE exercises.  Pt plans to d/c home tomorrow.  Precautions  Precautions Knee  Required Braces or Orthoses Knee Immobilizer - Left  Restrictions  Other Position/Activity Restrictions WBAT  Pain Assessment  Pain Assessment 0-10  Pain Score 9  Pain Location L knee with ambulation however reports none at rest  Pain Descriptors / Indicators Sore;Sharp  Pain Intervention(s) Limited activity within patient's tolerance;Monitored during session;Repositioned;Ice applied  Cognition  Arousal/Alertness Awake/alert  Behavior During Therapy WFL for tasks assessed/performed  Overall Cognitive Status Within Functional Limits for tasks assessed  Bed Mobility  Overal bed mobility Needs Assistance  Bed Mobility Sit to Supine  Sit to supine Supervision  General bed mobility comments verbal cues for self assist  Transfers  Overall transfer level Needs assistance  Equipment used Rolling walker (2 wheeled)  Transfers Sit to/from Stand  Sit to Stand Min guard  General transfer comment verbal cues for safe technique including L LE forward  Ambulation/Gait  Ambulation/Gait assistance Min guard  Ambulation Distance (Feet) 140 Feet  Assistive device Rolling walker (2 wheeled)  Gait Pattern/deviations Step-through pattern;Antalgic;Decreased stance time - left  General Gait Details verbal cues for RW positioning, step length  Exercises  Exercises Total Joint  Total Joint Exercises  Ankle Circles/Pumps AROM;Both;15 reps  Quad Sets AROM;Both;10 reps  Short Arc Quad 10 reps;Left;AAROM  Heel Slides  AAROM;Left;10 reps  Hip ABduction/ADduction AAROM;Left;10 reps  Straight Leg Raises AAROM;Left;10 reps  Goniometric ROM L knee AAROM flexion 55*  PT - End of Session  Equipment Utilized During Treatment Left knee immobilizer  Activity Tolerance Patient tolerated treatment well  Patient left in bed;with call bell/phone within reach  PT - Assessment/Plan  PT Plan Current plan remains appropriate  PT Frequency (ACUTE ONLY) 7X/week  Follow Up Recommendations Home health PT;Supervision for mobility/OOB  PT equipment None recommended by PT  PT Goal Progression  Progress towards PT goals Progressing toward goals  PT General Charges  $$ ACUTE PT VISIT 1 Procedure  PT Treatments  $Gait Training 8-22 mins  $Therapeutic Exercise 8-22 mins   Zenovia Jarred, PT, DPT 01/29/2015 Pager: [phone]

## 2015-01-29 NOTE — Progress Notes (Signed)
Subjective: Patient stable pain controlled   Objective: Vital signs in last 24 hours: Temp:  [97.6 F (36.4 C)-98.7 F (37.1 C)] 98.7 F (37.1 C) (12/03 0641) Pulse Rate:  [64-108] 102 (12/03 0641) Resp:  [17-22] 18 (12/03 0641) BP: (103-156)/(52-78) 156/66 mmHg (12/03 0641) SpO2:  [92 %-100 %] 96 % (12/03 0641) Weight:  [93.441 kg (206 lb)] 93.441 kg (206 lb) (12/02 1748)  Intake/Output from previous day: 12/02 0701 - 12/03 0700 In: 2100 [P.O.:600; I.V.:1500] Out: 800 [Urine:750; Blood:50] Intake/Output this shift: Total I/O In: 120 [P.O.:120] Out: -   Exam:  Sensation intact distally Dorsiflexion/Plantar flexion intact  Labs:  Recent Labs  01/29/15 0510  HGB 10.5*    Recent Labs  01/29/15 0510  WBC 6.7  RBC 3.18*  HCT 31.3*  PLT 181    Recent Labs  01/29/15 0510  NA 138  K 4.5  CL 103  CO2 28  BUN 20  CREATININE 0.96  GLUCOSE 138*  CALCIUM 8.6*   No results for input(s): LABPT, INR in the last 72 hours.  Assessment/Plan: Plan mobilization with physical therapy possible discharge Sunday versus Monday   Raffaella Edison SCOTT 01/29/2015, 10:50 AM

## 2015-01-29 NOTE — Evaluation (Signed)
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  Pt will benefit from skilled PT to increase their independence and safety with mobility to allow discharge to the venue listed below.   Pt reports she lives with a roommate and will also have her sister coming to stay with her upon d/c home.     Follow Up Recommendations Home health PT;Supervision for mobility/OOB    Equipment Recommendations  None recommended by PT    Recommendations for Other Services       Precautions / Restrictions Precautions Precautions: Knee Required Braces or Orthoses: Knee Immobilizer - Left Restrictions Other Position/Activity Restrictions: WBAT      Mobility  Bed Mobility Overal bed mobility: Needs Assistance Bed Mobility: Supine to Sit     Supine to sit: Min guard;HOB elevated     General bed mobility comments: verbal cues for sefl assist  Transfers Overall transfer level: Needs assistance Equipment used: Rolling walker (2 wheeled) Transfers: Sit to/from Stand Sit to Stand: Min assist         General transfer comment: verbal cues for safe technique, assist to steady with rise  Ambulation/Gait Ambulation/Gait assistance: Min assist Ambulation Distance (Feet): 120 Feet Assistive device: Rolling walker (2 wheeled) Gait Pattern/deviations: Step-to pattern;Antalgic     General Gait Details: verbal cues for sequence, RW positioning, step length, assist for LOB with tight turn  Stairs            Wheelchair Mobility    Modified Rankin (Stroke Patients Only)       Balance                                             Pertinent Vitals/Pain Pain Assessment: 0-10 Pain Score: 4  Pain Location: L  knee Pain Descriptors / Indicators: Aching;Sore Pain Intervention(s): Monitored during session;Limited activity within patient's tolerance;Repositioned;Premedicated before session    Home Living Family/patient expects to be discharged to:: Private residence Living Arrangements: Non-relatives/Friends Available Help at Discharge: Family;Friend(s) Type of Home: House Home Access: Level entry     Home Layout: One level Home Equipment: Environmental consultant - 2 wheels;Bedside commode Additional Comments: pt reports her sister is going to stay with her upon d/c and assist    Prior Function Level of Independence: Independent               Hand Dominance        Extremity/Trunk Assessment               Lower Extremity Assessment: LLE deficits/detail   LLE Deficits / Details: able to perform SLR, ROM not tested     Communication   Communication: No difficulties  Cognition Arousal/Alertness: Awake/alert Behavior During Therapy: WFL for tasks assessed/performed Overall Cognitive Status: Within Functional Limits for tasks assessed                      General Comments      Exercises        Assessment/Plan    PT Assessment    PT Diagnosis Difficulty walking;Acute pain   PT Problem List Decreased  strength;Decreased range of motion;Decreased mobility;Decreased knowledge of use of DME;Decreased knowledge of precautions;Pain  PT Treatment Interventions Functional mobility training;Stair training;Gait training;DME instruction;Patient/family education;Therapeutic activities;Therapeutic exercise   PT Goals (Current goals can be found in the Care Plan section) Acute Rehab PT Goals PT Goal Formulation: With patient Time For Goal Achievement: 02/02/15 Potential to Achieve Goals: Good    Frequency 7X/week   Barriers to discharge        Co-evaluation               End of Session Equipment Utilized During Treatment: Gait belt;Left knee immobilizer Activity  Tolerance: Patient tolerated treatment well Patient left: in chair;with call bell/phone within reach;with family/visitor present           Time: 1031-1050 PT Time Calculation (min) (ACUTE ONLY): 19 min   Charges:   PT Evaluation $Initial PT Evaluation Tier I: 1 Procedure     PT G Codes:        Nimrat Woolworth,KATHrine E 01/29/2015, 12:24 PM Zenovia Jarred, PT, DPT 01/29/2015 Pager: [phone]

## 2015-01-29 NOTE — Discharge Instructions (Signed)
INSTRUCTIONS AFTER JOINT REPLACEMENT  ° °o Remove items at home which could result in a fall. This includes throw rugs or furniture in walking pathways °o ICE to the affected joint every three hours while awake for 30 minutes at a time, for at least the first 3-5 days, and then as needed for pain and swelling.  Continue to use ice for pain and swelling. You may notice swelling that will progress down to the foot and ankle.  This is normal after surgery.  Elevate your leg when you are not up walking on it.   °o Continue to use the breathing machine you got in the hospital (incentive spirometer) which will help keep your temperature down.  It is common for your temperature to cycle up and down following surgery, especially at night when you are not up moving around and exerting yourself.  The breathing machine keeps your lungs expanded and your temperature down. ° ° °DIET:  As you were doing prior to hospitalization, we recommend a well-balanced diet. ° °DRESSING / WOUND CARE / SHOWERING ° °Keep the surgical dressing until follow up.  The dressing is water proof, so you can shower without any extra covering.  IF THE DRESSING FALLS OFF or the wound gets wet inside, change the dressing with sterile gauze.  Please use good hand washing techniques before changing the dressing.  Do not use any lotions or creams on the incision until instructed by your surgeon.   ° °ACTIVITY ° °o Increase activity slowly as tolerated, but follow the weight bearing instructions below.   °o No driving for 6 weeks or until further direction given by your physician.  You cannot drive while taking narcotics.  °o No lifting or carrying greater than 10 lbs. until further directed by your surgeon. °o Avoid periods of inactivity such as sitting longer than an hour when not asleep. This helps prevent blood clots.  °o You may return to work once you are authorized by your doctor.  ° ° ° °WEIGHT BEARING  ° °Weight bearing as tolerated with assist  device (walker, cane, etc) as directed, use it as long as suggested by your surgeon or therapist, typically at least 4-6 weeks. ° ° °EXERCISES ° °Results after joint replacement surgery are often greatly improved when you follow the exercise, range of motion and muscle strengthening exercises prescribed by your doctor. Safety measures are also important to protect the joint from further injury. Any time any of these exercises cause you to have increased pain or swelling, decrease what you are doing until you are comfortable again and then slowly increase them. If you have problems or questions, call your caregiver or physical therapist for advice.  ° °Rehabilitation is important following a joint replacement. After just a few days of immobilization, the muscles of the leg can become weakened and shrink (atrophy).  These exercises are designed to build up the tone and strength of the thigh and leg muscles and to improve motion. Often times heat used for twenty to thirty minutes before working out will loosen up your tissues and help with improving the range of motion but do not use heat for the first two weeks following surgery (sometimes heat can increase post-operative swelling).  ° °These exercises can be done on a training (exercise) mat, on the floor, on a table or on a bed. Use whatever works the best and is most comfortable for you.    Use music or television while you are exercising so that   the exercises are a pleasant break in your day. This will make your life better with the exercises acting as a break in your routine that you can look forward to.   Perform all exercises about fifteen times, three times per day or as directed.  You should exercise both the operative leg and the other leg as well. ° °Exercises include: °  °• Quad Sets - Tighten up the muscle on the front of the thigh (Quad) and hold for 5-10 seconds.   °• Straight Leg Raises - With your knee straight (if you were given a brace, keep it on),  lift the leg to 60 degrees, hold for 3 seconds, and slowly lower the leg.  Perform this exercise against resistance later as your leg gets stronger.  °• Leg Slides: Lying on your back, slowly slide your foot toward your buttocks, bending your knee up off the floor (only go as far as is comfortable). Then slowly slide your foot back down until your leg is flat on the floor again.  °• Angel Wings: Lying on your back spread your legs to the side as far apart as you can without causing discomfort.  °• Hamstring Strength:  Lying on your back, push your heel against the floor with your leg straight by tightening up the muscles of your buttocks.  Repeat, but this time bend your knee to a comfortable angle, and push your heel against the floor.  You may put a pillow under the heel to make it more comfortable if necessary.  ° °A rehabilitation program following joint replacement surgery can speed recovery and prevent re-injury in the future due to weakened muscles. Contact your doctor or a physical therapist for more information on knee rehabilitation.  ° ° °CONSTIPATION ° °Constipation is defined medically as fewer than three stools per week and severe constipation as less than one stool per week.  Even if you have a regular bowel pattern at home, your normal regimen is likely to be disrupted due to multiple reasons following surgery.  Combination of anesthesia, postoperative narcotics, change in appetite and fluid intake all can affect your bowels.  ° °YOU MUST use at least one of the following options; they are listed in order of increasing strength to get the job done.  They are all available over the counter, and you may need to use some, POSSIBLY even all of these options:   ° °Drink plenty of fluids (prune juice may be helpful) and high fiber foods °Colace 100 mg by mouth twice a day  °Senokot for constipation as directed and as needed Dulcolax (bisacodyl), take with full glass of water  °Miralax (polyethylene glycol)  once or twice a day as needed. ° °If you have tried all these things and are unable to have a bowel movement in the first 3-4 days after surgery call either your surgeon or your primary doctor.   ° °If you experience loose stools or diarrhea, hold the medications until you stool forms back up.  If your symptoms do not get better within 1 week or if they get worse, check with your doctor.  If you experience "the worst abdominal pain ever" or develop nausea or vomiting, please contact the office immediately for further recommendations for treatment. ° ° °ITCHING:  If you experience itching with your medications, try taking only a single pain pill, or even half a pain pill at a time.  You can also use Benadryl over the counter for itching or also to   help with sleep.  ° °TED HOSE STOCKINGS:  Use stockings on both legs until for at least 2 weeks or as directed by physician office. They may be removed at night for sleeping. ° °MEDICATIONS:  See your medication summary on the “After Visit Summary” that nursing will review with you.  You may have some home medications which will be placed on hold until you complete the course of blood thinner medication.  It is important for you to complete the blood thinner medication as prescribed. ° °PRECAUTIONS:  If you experience chest pain or shortness of breath - call 911 immediately for transfer to the hospital emergency department.  ° °If you develop a fever greater that 101 F, purulent drainage from wound, increased redness or drainage from wound, foul odor from the wound/dressing, or calf pain - CONTACT YOUR SURGEON.   °                                                °FOLLOW-UP APPOINTMENTS:  If you do not already have a post-op appointment, please call the office for an appointment to be seen by your surgeon.  Guidelines for how soon to be seen are listed in your “After Visit Summary”, but are typically between 1-4 weeks after surgery. ° °OTHER INSTRUCTIONS:  ° °Knee  Replacement:  Do not place pillow under knee, focus on keeping the knee straight while resting. CPM instructions: 0-90 degrees, 2 hours in the morning, 2 hours in the afternoon, and 2 hours in the evening. Place foam block, curve side up under heel at all times except when in CPM or when walking.  DO NOT modify, tear, cut, or change the foam block in any way. ° °MAKE SURE YOU:  °• Understand these instructions.  °• Get help right away if you are not doing well or get worse.  ° ° °Thank you for letting us be a part of your medical care team.  It is a privilege we respect greatly.  We hope these instructions will help you stay on track for a fast and full recovery!  ° °Information on my medicine - XARELTO® (Rivaroxaban) ° °This medication education was reviewed with me or my healthcare representative as part of my discharge preparation. ° °Why was Xarelto® prescribed for you? °Xarelto® was prescribed for you to reduce the risk of blood clots forming after orthopedic surgery. The medical term for these abnormal blood clots is venous thromboembolism (VTE). ° °What do you need to know about xarelto® ? °Take your Xarelto® ONCE DAILY at the same time every day. °You may take it either with or without food. ° °If you have difficulty swallowing the tablet whole, you may crush it and mix in applesauce just prior to taking your dose. ° °Take Xarelto® exactly as prescribed by your doctor and DO NOT stop taking Xarelto® without talking to the doctor who prescribed the medication.  Stopping without other VTE prevention medication to take the place of Xarelto® may increase your risk of developing a clot. ° °After discharge, you should have regular check-up appointments with your healthcare provider that is prescribing your Xarelto®.   ° °What do you do if you miss a dose? °If you miss a dose, take it as soon as you remember on the same day then continue your regularly scheduled once daily regimen the next day. Do   not take two  doses of Xarelto® on the same day.  ° °Important Safety Information °A possible side effect of Xarelto® is bleeding. You should call your healthcare provider right away if you experience any of the following: °? Bleeding from an injury or your nose that does not stop. °? Unusual colored urine (red or dark brown) or unusual colored stools (red or black). °? Unusual bruising for unknown reasons. °? A serious fall or if you hit your head (even if there is no bleeding). ° °Some medicines may interact with Xarelto® and might increase your risk of bleeding while on Xarelto®. To help avoid this, consult your healthcare provider or pharmacist prior to using any new prescription or non-prescription medications, including herbals, vitamins, non-steroidal anti-inflammatory drugs (NSAIDs) and supplements. ° °This website has more information on Xarelto®: www.xarelto.com. ° ° ° ° °

## 2015-01-30 NOTE — Progress Notes (Signed)
Subjective: Pt stable  Desires dc to home   Objective: Vital signs in last 24 hours: Temp:  [99.3 F (37.4 C)-100.3 F (37.9 C)] 99.3 F (37.4 C) (12/04 0517) Pulse Rate:  [101-110] 107 (12/04 0517) Resp:  [16-20] 18 (12/04 0517) BP: (160-174)/(63-74) 160/71 mmHg (12/04 0517) SpO2:  [93 %-95 %] 95 % (12/04 0517)  Intake/Output from previous day: 12/03 0701 - 12/04 0700 In: 1440 [P.O.:1440] Out: 1350 [Urine:1350] Intake/Output this shift:    Exam:  Neurovascular intact Sensation intact distally Intact pulses distally Dorsiflexion/Plantar flexion intact  Labs:  Recent Labs  01/29/15 0510  HGB 10.5*    Recent Labs  01/29/15 0510  WBC 6.7  RBC 3.18*  HCT 31.3*  PLT 181    Recent Labs  01/29/15 0510  NA 138  K 4.5  CL 103  CO2 28  BUN 20  CREATININE 0.96  GLUCOSE 138*  CALCIUM 8.6*   No results for input(s): LABPT, INR in the last 72 hours.  Assessment/Plan: Dc home   DEAN,GREGORY SCOTT 01/30/2015, 11:42 AM

## 2015-01-30 NOTE — Care Management Note (Signed)
Case Management Note  Patient Details  Name: Lori Orozco MRN: [id number] Date of Birth: 1950/04/08  Subjective/Objective:    Left total knee arthroplasty                Action/Plan: NCM spoke to pt and offered choice for [facility]. Pt agreeable to [facility] for [facility]. Has RW, bedside commode and cane at home. Sister at home to assist with care as needed.   Expected Discharge Date:  01/30/2015              Expected Discharge Plan:  Home w Home Health Services  In-House Referral:     Discharge planning Services  CM Consult  Post Acute Care Choice:  Home Health Choice offered to:  Patient   HH Arranged:  PT HH Agency:  [facility]  Status of Service:  Completed, signed off  Medicare Important Message Given:    Date Medicare IM Given:    Medicare IM give by:    Date Additional Medicare IM Given:    Additional Medicare Important Message give by:     If discussed at Long Length of Stay Meetings, dates discussed:    Additional Comments:  Elliot Cousin, RN 01/30/2015, 10:53 AM

## 2015-01-30 NOTE — Progress Notes (Signed)
Discharged from floor via w/c, friend & belongings with pt. No changes in assessment. Norris Brumbach  

## 2015-01-30 NOTE — Progress Notes (Signed)
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 OT will sign off.  Follow Up Recommendations  No OT follow up;Supervision - Intermittent    Equipment Recommendations  None recommended by OT    Recommendations for Other Services      Precautions / Restrictions Precautions Precautions: Knee Required Braces or Orthoses: Knee Immobilizer - Left Restrictions Weight Bearing Restrictions: No Other Position/Activity Restrictions: WBAT       Mobility Bed Mobility Overal bed mobility: Needs Assistance Bed Mobility: Sit to Supine       Sit to supine: Supervision      Transfers Overall transfer level: Needs assistance Equipment used: Rolling walker (2 wheeled) Transfers: Sit to/from Stand Sit to Stand: Supervision              Balance                                   ADL Overall ADL's : Needs assistance/impaired Eating/Feeding: Independent   Grooming: Wash/dry Radiographer, therapeutic: Supervision/safety;Ambulation;BSC;RW   Toileting- Clothing Manipulation and Hygiene: Supervision/safety;Sit to/from stand       Functional mobility during ADLs: Supervision/safety;Rolling walker General ADL Comments: Patient received sitting in recliner eating breakfast. Reports discomfort from chair and reports she needs to use the bathroom. Assisted patient to bathroom, then to bed per her request. When attempting to sit on bed, patient had decreased eccentric control due to forgetting to place operated leg in front. Educated on proper technique. Patient plans to discharge home today.      Vision                      Perception     Praxis      Cognition   Behavior During Therapy: WFL for tasks assessed/performed Overall Cognitive Status: Within Functional Limits for tasks assessed                       Extremity/Trunk Assessment               Exercises     Shoulder Instructions       General Comments      Pertinent Vitals/ Pain       Pain Assessment: 0-10 Pain Score: 5  Pain Location: L knee Pain Descriptors / Indicators: Aching;Sore Pain Intervention(s): Limited activity within patient's tolerance;Monitored during session;Repositioned  Home Living                                          Prior Functioning/Environment              Frequency       Progress Toward Goals  OT Goals(current goals can now be found in the care plan section)  Progress towards OT goals: Goals met/education completed, patient discharged from [facility] All goals met and education completed, patient discharged from OT services  Co-evaluation                 End of Session Equipment Utilized During Treatment: Rolling walker;Left knee immobilizer CPM Left Knee CPM Left Knee: Off   Activity Tolerance Patient tolerated treatment well   Patient Left in bed;with call bell/phone within reach   Nurse Communication          Time: 6147-0929 OT Time Calculation (min): 13 min  Charges: OT General Charges $OT Visit: 1 Procedure OT Treatments $Self Care/Home Management : 8-22 mins  Laurin Morgenstern A 01/30/2015, 9:58 AM

## 2015-01-30 NOTE — Progress Notes (Signed)
Physical Therapy Treatment Patient Details Name: Lori Orozco MRN: [id number] DOB: [birth year] Today's Date: 01/30/2015    History of Present Illness Pt is a 64 year old female s/p L TKA with hx of R THA    PT Comments    Pt ambulated good distance in hallway and performed LE exercises.  Pt plans to d/c home today and had no further questions/concerns.  Follow Up Recommendations  Home health PT;Supervision for mobility/OOB     Equipment Recommendations  None recommended by PT    Recommendations for Other Services       Precautions / Restrictions Precautions Precautions: Knee Required Braces or Orthoses: Knee Immobilizer - Left Restrictions Weight Bearing Restrictions: No Other Position/Activity Restrictions: WBAT    Mobility  Bed Mobility Overal bed mobility: Needs Assistance Bed Mobility: Sit to Supine;Supine to Sit     Supine to sit: Supervision Sit to supine: Supervision   General bed mobility comments: verbal cues for self assist  Transfers Overall transfer level: Needs assistance Equipment used: Rolling walker (2 wheeled) Transfers: Sit to/from Stand Sit to Stand: Supervision         General transfer comment: verbal cues for safe technique including L LE forward  Ambulation/Gait Ambulation/Gait assistance: Supervision Ambulation Distance (Feet): 240 Feet Assistive device: Rolling walker (2 wheeled) Gait Pattern/deviations: Antalgic;Step-through pattern;Decreased stance time - left Gait velocity: verbal cues for safe RW positioning (occasionally catches R toes on walker leg) and smaller steps for better pain control   General Gait Details: verbal cues for RW positioning, step length   Stairs            Wheelchair Mobility    Modified Rankin (Stroke Patients Only)       Balance                                    Cognition Arousal/Alertness: Awake/alert Behavior During Therapy: WFL for tasks  assessed/performed Overall Cognitive Status: Within Functional Limits for tasks assessed                      Exercises Total Joint Exercises Ankle Circles/Pumps: AROM;Both;15 reps Quad Sets: AROM;Both;10 reps Short Arc Quad: 10 reps;Left;AROM Heel Slides: AAROM;Left;10 reps Hip ABduction/ADduction: AAROM;Left;10 reps Straight Leg Raises: AAROM;Left;10 reps    General Comments        Pertinent Vitals/Pain Pain Assessment: 0-10 Pain Score: 5  Pain Location: L knee Pain Descriptors / Indicators: Aching;Sore Pain Intervention(s): Limited activity within patient's tolerance;Monitored during session;Repositioned;Premedicated before session;Ice applied    Home Living                      Prior Function            PT Goals (current goals can now be found in the care plan section) Progress towards PT goals: Progressing toward goals    Frequency  7X/week    PT Plan Current plan remains appropriate    Co-evaluation             End of Session   Activity Tolerance: Patient tolerated treatment well Patient left: in bed;with call bell/phone within reach     Time: 0953-1027 PT Time Calculation (min) (ACUTE ONLY): 34 min  Charges:  $Gait Training: 8-22 mins $Therapeutic Exercise: 8-22 mins                    G Codes:  Marnette Perkins,KATHrine E 01/30/2015, 11:53 AM Zenovia Jarred, PT, DPT 01/30/2015 Pager: [phone]

## 2015-01-30 NOTE — Progress Notes (Signed)
Utilization Review Completed.Lori Orozco T12/05/2014  

## 2015-01-31 ENCOUNTER — Encounter (HOSPITAL_COMMUNITY): Payer: Self-pay | Admitting: Orthopaedic Surgery

## 2015-02-01 NOTE — Anesthesia Postprocedure Evaluation (Signed)
Anesthesia Post Note  Patient: Lori Orozco  Procedure(s) Performed: Procedure(s) (LRB): LEFT TOTAL KNEE ARTHROPLASTY (Left)  Patient location during evaluation: PACU Anesthesia Type: Spinal Level of consciousness: awake and alert Pain management: pain level controlled Vital Signs Assessment: post-procedure vital signs reviewed and stable Respiratory status: spontaneous breathing Cardiovascular status: blood pressure returned to baseline Postop Assessment: spinal receding Anesthetic complications: no    Last Vitals:  Filed Vitals:   01/30/15 0058 01/30/15 0517  BP:  160/71  Pulse:  107  Temp: 37.9 C 37.4 C  Resp:  18    Last Pain:  Filed Vitals:   01/30/15 0946  PainSc: 3                  Kennieth Rad

## 2015-02-02 NOTE — Discharge Summary (Signed)
Patient ID: Lori Orozco MRN: [id number] DOB/AGE: 64-29-1952 64 y.o.  Admit date: 01/28/2015 Discharge date: 02/02/2015  Admission Diagnoses:  Principal Problem:   Osteoarthritis of left knee Active Problems:   Status post total left knee replacement   Discharge Diagnoses:  Same  Past Medical History  Diagnosis Date  . COPD (chronic obstructive pulmonary disease) (HCC)   . Chronic fatigue syndrome   . Hyperlipidemia   . Anxiety   . Depression   . Irritable bowel syndrome   . Migraines   . Amaurosis fugax   . Asthma   . Cataract   . Emphysema of lung (HCC)   . Thyroid disease     [place]  . Lumbar herniated disc   . PONV (postoperative nausea and vomiting)   . Hypothyroidism   . Pneumonia   . Chronic kidney disease     frequency, nephritis when 64 yrs old  . GERD (gastroesophageal reflux disease)     occ  . H/O hiatal hernia   . Fibromyalgia   . Anemia     hx  . Shortness of breath     exertion   . History of bronchitis   . Urinary tract infection   . Urinary frequency   . Arthritis   . Diverticulitis   . Vertigo   . Lichen sclerosus   . Interstitial cystitis     Surgeries: Procedure(s): LEFT TOTAL KNEE ARTHROPLASTY on 01/28/2015   Consultants:    Discharged Condition: Improved  Hospital Course: Lori Orozco is an 64 y.o. female who was admitted 01/28/2015 for operative treatment ofOsteoarthritis of left knee. Patient has severe unremitting pain that affects sleep, daily activities, and work/hobbies. After pre-op clearance the patient was taken to the operating room on 01/28/2015 and underwent  Procedure(s): LEFT TOTAL KNEE ARTHROPLASTY.    Patient was given perioperative antibiotics:  Anti-infectives    Start     Dose/Rate Route Frequency Ordered Stop   01/29/15 0600  clindamycin (CLEOCIN) IVPB 900 mg  Status:  Discontinued     900 mg 100 mL/hr over 30 Minutes Intravenous On call to O.R. 01/28/15 1203 01/28/15 1207   01/28/15 2000   clindamycin (CLEOCIN) IVPB 600 mg     600 mg 100 mL/hr over 30 Minutes Intravenous Every 6 hours 01/28/15 1748 01/29/15 0236   01/28/15 1300  clindamycin (CLEOCIN) IVPB 900 mg     900 mg 100 mL/hr over 30 Minutes Intravenous On call to O.R. 01/28/15 1206 01/28/15 1423       Patient was given sequential compression devices, early ambulation, and chemoprophylaxis to prevent DVT.  Patient benefited maximally from hospital stay and there were no complications.    Recent vital signs: No data found.    Recent laboratory studies: No results for input(s): WBC, HGB, HCT, PLT, NA, K, CL, CO2, BUN, CREATININE, GLUCOSE, INR, CALCIUM in the last 72 hours.  Invalid input(s): PT, 2   Discharge Medications:     Medication List    TAKE these medications        albuterol 108 (90 BASE) MCG/ACT inhaler  Commonly known as:  PROAIR HFA  Inhale 2 puffs into the lungs every 4 (four) hours as needed for wheezing.     aspirin 81 MG chewable tablet  Chew 81 mg by mouth at bedtime.     buPROPion 150 MG 24 hr tablet  Commonly known as:  WELLBUTRIN XL  Take 150 mg by mouth daily.     busPIRone 15 MG tablet  Commonly known as:  BUSPAR  Take 15 mg by mouth 2 (two) times daily before a meal.     clobetasol cream 0.05 %  Commonly known as:  TEMOVATE  APPLY SMALL AMOUNT TO AFFECTED AREAS 2 TO 3 TIMES PER WEEK     cyanocobalamin 2000 MCG tablet  Take 2,000 mcg by mouth daily.     divalproex 500 MG 24 hr tablet  Commonly known as:  DEPAKOTE ER  Take 500 mg by mouth every evening.     docusate sodium 100 MG capsule  Commonly known as:  COLACE  Take 1 capsule (100 mg total) by mouth 2 (two) times daily as needed for mild constipation.     fluticasone 50 MCG/ACT nasal spray  Commonly known as:  FLONASE  Place 2 sprays into both nostrils daily as needed for allergies or rhinitis.     HYDROcodone-acetaminophen 5-325 MG tablet  Commonly known as:  NORCO/VICODIN  Take 1 tablet by mouth every 6  (six) hours as needed for moderate pain.     levothyroxine 50 MCG tablet  Commonly known as:  SYNTHROID, LEVOTHROID  Take 50 mcg by mouth at bedtime.     LORazepam 1 MG tablet  Commonly known as:  ATIVAN  Take 1 mg by mouth 2 (two) times daily as needed for anxiety.     methocarbamol 500 MG tablet  Commonly known as:  ROBAXIN  Take 1 tablet (500 mg total) by mouth every 6 (six) hours as needed for muscle spasms.     mirtazapine 45 MG tablet  Commonly known as:  REMERON  Take 45 mg by mouth at bedtime.     nabumetone 750 MG tablet  Commonly known as:  RELAFEN  Take 750 mg by mouth 2 (two) times daily.     oxyCODONE 5 MG immediate release tablet  Commonly known as:  Oxy IR/ROXICODONE  Take 1-2 tablets (5-10 mg total) by mouth every 4 (four) hours as needed for breakthrough pain.     PREMARIN 0.625 MG tablet  Generic drug:  estrogens (conjugated)  Take 0.625 mg by mouth daily.     rivaroxaban 10 MG Tabs tablet  Commonly known as:  XARELTO  Take 1 tablet (10 mg total) by mouth daily with breakfast.     sertraline 100 MG tablet  Commonly known as:  ZOLOFT  Take 100 mg by mouth daily.     simvastatin 20 MG tablet  Commonly known as:  ZOCOR  Take 20 mg by mouth at bedtime.     Vitamin D3 2000 UNITS Tabs  Take 2,000 Units by mouth daily.        Diagnostic Studies: Dg Knee Left Port  01/28/2015  CLINICAL DATA:  Left total knee arthroplasty EXAM: PORTABLE LEFT KNEE - 1-2 VIEW COMPARISON:  None. FINDINGS: The patient is status post left total knee arthroplasty, with well-positioned left distal femoral and left proximal tibial prostheses, with no evidence of hardware fracture or loosening. No osseous fracture or malalignment. No suspicious focal osseous lesion. Expected overlying midline skin staples and soft tissue gas within and surrounding the left knee joint. IMPRESSION: Satisfactory appearance status post left total knee arthroplasty. Electronically Signed   By: Delbert Phenix  M.D.   On: 01/28/2015 16:38    Disposition: 06-Home-Health Care Svc      Discharge Instructions    Call MD / Call 911    Complete by:  As directed   If you experience chest pain or shortness of breath,  CALL 911 and be transported to the hospital emergency room.  If you develope a fever above 101 F, pus (white drainage) or increased drainage or redness at the wound, or calf pain, call your surgeon's office.     Constipation Prevention    Complete by:  As directed   Drink plenty of fluids.  Prune juice may be helpful.  You may use a stool softener, such as Colace (over the counter) 100 mg twice a day.  Use MiraLax (over the counter) for constipation as needed.     Diet - low sodium heart healthy    Complete by:  As directed      Increase activity slowly as tolerated    Complete by:  As directed            Follow-up Information    Follow up with [name], MD In 2 weeks.   Specialty:  Orthopedic Surgery   Contact information:   [address] [place] [address] [phone]       Follow up with [facility].   Why:  Home Health Physical Therapy   Contact information:   [address] [place] [address] [phone]        Signed: [name] 02/02/2015, 12:39 PM

## 2015-03-21 ENCOUNTER — Ambulatory Visit (INDEPENDENT_AMBULATORY_CARE_PROVIDER_SITE_OTHER): Payer: Medicare Other | Admitting: Obstetrics & Gynecology

## 2015-03-21 ENCOUNTER — Encounter: Payer: Self-pay | Admitting: Obstetrics & Gynecology

## 2015-03-21 VITALS — BP 166/89 | HR 121 | Resp 16 | Ht 67.0 in | Wt 195.0 lb

## 2015-03-21 DIAGNOSIS — N9089 Other specified noninflammatory disorders of vulva and perineum: Secondary | ICD-10-CM

## 2015-03-21 DIAGNOSIS — N904 Leukoplakia of vulva: Secondary | ICD-10-CM

## 2015-03-21 NOTE — Progress Notes (Signed)
   Subjective:    Patient ID: [name], female    DOB: [birth year], 65 y.o.   MRN: [id number]  HPI  65 yo lady with biopsy-proven lichen sclerosis with persistent vaginal introitus white lesion despite 3 times per week temovate.  Review of Systems She has now had her right knee replacement and is doing well with recovery.    Objective:   Physical Exam  [id number] Breathing, conversing, and ambulating normally (slight favoring of her left knee) I prepped the area with betadine after spraying lidocaine topically. I then used 1 cc of 1% lidocaine injection I used a 39mm punch biopsy. The area was cauterized with silver nitrate. She tolerated the procedure well.      Assessment & Plan:  Vulvar lesion- await biopsy

## 2015-04-10 ENCOUNTER — Other Ambulatory Visit: Payer: Self-pay | Admitting: Family Medicine

## 2015-04-26 ENCOUNTER — Encounter: Payer: Self-pay | Admitting: Family Medicine

## 2015-04-26 ENCOUNTER — Ambulatory Visit (INDEPENDENT_AMBULATORY_CARE_PROVIDER_SITE_OTHER): Payer: Medicare Other | Admitting: Family Medicine

## 2015-04-26 VITALS — BP 136/84 | HR 79 | Temp 98.0°F | Ht 66.5 in | Wt 200.0 lb

## 2015-04-26 DIAGNOSIS — E559 Vitamin D deficiency, unspecified: Secondary | ICD-10-CM | POA: Diagnosis not present

## 2015-04-26 DIAGNOSIS — E039 Hypothyroidism, unspecified: Secondary | ICD-10-CM | POA: Diagnosis not present

## 2015-04-26 DIAGNOSIS — M8580 Other specified disorders of bone density and structure, unspecified site: Secondary | ICD-10-CM | POA: Diagnosis not present

## 2015-04-26 DIAGNOSIS — Z0000 Encounter for general adult medical examination without abnormal findings: Secondary | ICD-10-CM | POA: Diagnosis not present

## 2015-04-26 DIAGNOSIS — J4489 Other specified chronic obstructive pulmonary disease: Secondary | ICD-10-CM

## 2015-04-26 DIAGNOSIS — Z1159 Encounter for screening for other viral diseases: Secondary | ICD-10-CM

## 2015-04-26 DIAGNOSIS — Z114 Encounter for screening for human immunodeficiency virus [HIV]: Secondary | ICD-10-CM

## 2015-04-26 DIAGNOSIS — E669 Obesity, unspecified: Secondary | ICD-10-CM

## 2015-04-26 DIAGNOSIS — E7800 Pure hypercholesterolemia, unspecified: Secondary | ICD-10-CM | POA: Diagnosis not present

## 2015-04-26 DIAGNOSIS — J449 Chronic obstructive pulmonary disease, unspecified: Secondary | ICD-10-CM

## 2015-04-26 LAB — COMPREHENSIVE METABOLIC PANEL
ALK PHOS: 66 U/L (ref 39–117)
ALT: 8 U/L (ref 0–35)
AST: 15 U/L (ref 0–37)
Albumin: 4.1 g/dL (ref 3.5–5.2)
BILIRUBIN TOTAL: 0.3 mg/dL (ref 0.2–1.2)
BUN: 24 mg/dL — AB (ref 6–23)
CO2: 30 mEq/L (ref 19–32)
Calcium: 9.6 mg/dL (ref 8.4–10.5)
Chloride: 102 mEq/L (ref 96–112)
Creatinine, Ser: 1.04 mg/dL (ref 0.40–1.20)
GFR: 56.55 mL/min — AB (ref >60.00)
GLUCOSE: 86 mg/dL (ref 70–99)
Potassium: 4.5 mEq/L (ref 3.5–5.1)
Sodium: 138 mEq/L (ref 135–145)
TOTAL PROTEIN: 7.4 g/dL (ref 6.0–8.3)

## 2015-04-26 LAB — LIPID PANEL
CHOL/HDL RATIO: 3
Cholesterol: 199 mg/dL (ref 0–200)
HDL: 64 mg/dL (ref >39.00)
LDL Cholesterol: 108 mg/dL — ABNORMAL HIGH (ref 0–99)
NONHDL: 134.76
TRIGLYCERIDES: 132 mg/dL (ref 0.0–149.0)
VLDL: 26.4 mg/dL (ref 0.0–40.0)

## 2015-04-26 LAB — CBC WITH DIFFERENTIAL/PLATELET
BASOS ABS: 0 10*3/uL (ref 0.0–0.1)
Basophils Relative: 0.4 % (ref 0.0–3.0)
Eosinophils Absolute: 0.1 10*3/uL (ref 0.0–0.7)
Eosinophils Relative: 1.4 % (ref 0.0–5.0)
HEMATOCRIT: 36.3 % (ref 36.0–46.0)
HEMOGLOBIN: 12 g/dL (ref 12.0–15.0)
LYMPHS ABS: 2.5 10*3/uL (ref 0.7–4.0)
LYMPHS PCT: 35.1 % (ref 12.0–46.0)
MCHC: 33 g/dL (ref 30.0–36.0)
MCV: 91.8 fl (ref 78.0–100.0)
MONOS PCT: 6.1 % (ref 3.0–12.0)
Monocytes Absolute: 0.4 10*3/uL (ref 0.1–1.0)
NEUTROS PCT: 57 % (ref 43.0–77.0)
Neutro Abs: 4.1 10*3/uL (ref 1.4–7.7)
Platelets: 278 10*3/uL (ref 150.0–400.0)
RBC: 3.95 Mil/uL (ref 3.87–5.11)
RDW: 15.8 % — ABNORMAL HIGH (ref 11.5–15.5)
WBC: 7.2 10*3/uL (ref 4.0–10.5)

## 2015-04-26 LAB — TSH: TSH: 4.23 u[IU]/mL (ref 0.35–4.50)

## 2015-04-26 LAB — VITAMIN D 25 HYDROXY (VIT D DEFICIENCY, FRACTURES): VITD: 37.04 ng/mL (ref 30.00–100.00)

## 2015-04-26 MED ORDER — SIMVASTATIN 20 MG PO TABS: 20.0000 mg | ORAL_TABLET | Freq: Every day | ORAL | Status: DC

## 2015-04-26 NOTE — Patient Instructions (Signed)
Don't forget to schedule your mammogram  Lab today  Please work on your advance directive (the blue booklet)  Follow up for vertigo if no improvement (make sure to ask for a 30 minute appointment)- or call back and ask for a referral to ENT  Next- after age 65 - you will need a prevnar vaccine and bone density test

## 2015-04-26 NOTE — Progress Notes (Signed)
Subjective:    Patient ID: Lori Orozco, female    DOB: 1950/08/28, 65 y.o.   MRN: [id number]  HPI  Here for annual medicare wellness visit as well as chronic/acute medical problems   I have personally reviewed the Medicare Annual Wellness questionnaire and have noted 1. The patient's medical and social history 2. Their use of alcohol, tobacco or illicit drugs 3. Their current medications and supplements 4. The patient's functional ability including ADL's, fall risks, home safety risks and hearing or visual             impairment. 5. Diet and physical activities 6. Evidence for depression or mood disorders  The patients weight, height, BMI have been recorded in the chart and visual acuity is per eye clinic.  I have made referrals, counseling and provided education to the patient based review of the above and I have provided the pt with a written personalized care plan for preventive services. Reviewed and updated provider list, see scanned forms.  Had a knee replacement - doing well with that so far   See scanned forms.  Routine anticipatory guidance given to patient.  See health maintenance. 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 Nothing new/ no new concerns   PMH and SH reviewed  Meds, vitals, and allergies reviewed.   ROS: See HPI.  Otherwise negative.    Going to the gym- biking / working out and doing PT    Still has lots of aches and pains Old surgical pain in R leg from rod in it is still very very painful   Due for D level- was low 17.16 last year   Due for TSH- she has increased appetite at night  Unsure if it is thyroid related - or from psyche meds like remeron   Due for cholesterol Lab Results  Component Value Date   CHOL 182 04/23/2014   HDL 68.70 04/23/2014   LDLCALC 97 04/23/2014   TRIG 84.0 04/23/2014   CHOLHDL 3 04/23/2014    Does want screen for hep C , and HIV  Saw gyn for lichen sclerosis- every 6 months   Patient Active Problem List   Diagnosis Date Noted  . Need for hepatitis C screening test 04/26/2015  . Screening for HIV (human immunodeficiency virus) 04/26/2015  . Osteoarthritis of left knee 01/28/2015  . Status post total left knee replacement 01/28/2015  . Vitamin D deficiency 04/27/2014  . Seasonal and perennial allergic rhinitis 06/17/2013  . S/P total hip arthroplasty 03/20/2013  . 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 Encounter for Medicare annual wellness exam 11/18/2012  . History of falling 11/18/2012  . Routine general medical examination at a health care facility 11/10/2012  . 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 Osteopenia 10/19/2011  . Other screening mammogram 10/19/2011  . Hypothyroid 10/19/2011  . Tobacco use disorder  03/07/2010  . ANEMIA 10/11/2009  . PERIPHERAL NEUROPATHY, FEET 08/05/2009  . BACK PAIN 08/05/2009  . HAND PAIN, BILATERAL 08/05/2009  . TREMOR 03/09/2008  . MENOPAUSAL SYNDROME 10/09/2007  . DEPRESSION, MAJOR 05/20/2007  . BIPOLAR AFFECTIVE DISORDER 05/20/2007  . ANXIETY 05/20/2007  . 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 History of Guillain-Barre syndrome 05/20/2007  . ESOPHAGEAL SPASM 05/20/2007  . HIATAL HERNIA 05/20/2007  . AMAUROSIS FUGAX 05/07/2007  . COPD with chronic bronchitis (HCC) 03/27/2007  . HYPERCHOLESTEROLEMIA, PURE 12/10/2006  . SYMPTOM, SYNDROME, CHRONIC  FATIGUE 12/10/2006   Past Medical History  Diagnosis Date  . COPD (chronic obstructive pulmonary disease) (HCC)   . Chronic fatigue syndrome   . Hyperlipidemia   . Anxiety   . Depression   . Irritable bowel syndrome   . Migraines   . Amaurosis fugax   . Asthma   . Cataract   . Emphysema of lung (HCC)   . Thyroid disease     [place]  . Lumbar herniated disc   . PONV (postoperative nausea and vomiting)   . Hypothyroidism   . Pneumonia   . Chronic kidney disease     frequency, nephritis when 65 yrs old  . GERD (gastroesophageal reflux disease)     occ  . H/O hiatal hernia   . Fibromyalgia   . Anemia     hx  . Shortness of breath     exertion   . History of bronchitis   . Urinary tract infection   . Urinary frequency   . Arthritis   . Diverticulitis   . Vertigo   . Lichen sclerosus   . Interstitial cystitis    Past Surgical History  Procedure Laterality Date  . Temporomandibular joint surgery    . Abdominal hysterectomy    . Dexa-osteopenia    . Epicondylitis    . Foot surgery Bilateral   . Carotid dopplar    . Doppler echocardiography    . Tonsillectomy    . Knee surgery      Left cartilage  . Trigger finger release Right   . Colonoscopy  05/26/2010    avms- otherwise nl , re check 10y  . Plantar fascia surgery Left   . Wrist arthroscopy Right     ligament tear  . Eye surgery Bilateral     cataracts  . Total hip arthroplasty Right 03/20/2013    Procedure: Right TOTAL HIP ARTHROPLASTY;  Surgeon: [name], MD;  Location: [facility] OR;  Service: Orthopedics;  Laterality: Right;  Right Total Hip Arthroplasty  . Bladder sugery     . Lipoma in second finger right hand    . Total knee arthroplasty Left 01/28/2015    Procedure: LEFT TOTAL KNEE ARTHROPLASTY;  Surgeon: [name], MD;  Location: [facility] ORS;  Service: Orthopedics;  Laterality: Left;   Social History  Substance Use Topics  . Smoking status: Former Smoker -- 2.00 packs/day for 43 years     Types: Cigarettes    Quit date: 02/26/2013  . Smokeless tobacco: Never Used     Comment: "vaping now"  . Alcohol Use: No   Family History  Problem Relation Age of Onset  . Thyroid disease Mother   . Hypertension Mother   . Kidney disease Mother   . Cancer Father   . Colon cancer Other    Allergies  Allergen Reactions  . Codeine Nausea Only    Makes pt stay awake  . Cymbalta [Duloxetine Hcl] Other (See Comments)  Makes pt pass out   . Erythromycin     REACTION: abdominal pain  . Lithium Swelling  . Lyrica [Pregabalin]     Felt faint  . Neurontin [Gabapentin]     Passes  out  . Rabeprazole Sodium     REACTION: insomnia  . Tegretol [Carbamazepine] Other (See Comments)    Fever and body aches   . Duraprep [employer], Misc.] Itching and Rash    Tolerates Betadine   . Penicillins Rash    Has patient had a PCN reaction causing immediate rash, facial/tongue/throat swelling, SOB or lightheadedness with hypotension: Yes Has patient had a PCN reaction causing severe rash involving mucus membranes or skin necrosis: No Has patient had a PCN reaction that required hospitalization No Has patient had a PCN reaction occurring within the last 10 years: No If all of the above answers are "NO", then may proceed with Cephalosporin use.   . Tape Rash    PT ALLERGIC NYLON TAPE    Current Outpatient Prescriptions on File Prior to Visit  Medication Sig Dispense Refill  . aspirin 81 MG chewable tablet Chew 81 mg by mouth at bedtime.     Marland Kitchen buPROPion (WELLBUTRIN XL) 150 MG 24 hr tablet Take 150 mg by mouth daily.      . busPIRone (BUSPAR) 15 MG tablet Take 15 mg by mouth 2 (two) times daily before a meal.     . Cholecalciferol (VITAMIN D3) 2000 UNITS TABS Take 2,000 Units by mouth daily.    . clobetasol cream (TEMOVATE) 0.05 % APPLY SMALL AMOUNT TO AFFECTED AREAS 2 TO 3 TIMES PER WEEK (Patient taking differently: APPLY SMALL AMOUNT TO AFFECTED AREAS 3 TIMES PER WEEK) 30 g 11  .  cyanocobalamin 2000 MCG tablet Take 2,000 mcg by mouth daily.    . divalproex (DEPAKOTE ER) 500 MG 24 hr tablet Take 500 mg by mouth every evening.     . docusate sodium (COLACE) 100 MG capsule Take 1 capsule (100 mg total) by mouth 2 (two) times daily as needed for mild constipation. 20 capsule 0  . fluticasone (FLONASE) 50 MCG/ACT nasal spray PLACE 2 SPRAYS INTO THE NOSE DAILY. 16 g 0  . HYDROcodone-acetaminophen (NORCO/VICODIN) 5-325 MG tablet Take 1 tablet by mouth every 6 (six) hours as needed for moderate pain.    Marland Kitchen levothyroxine (SYNTHROID, LEVOTHROID) 50 MCG tablet Take 50 mcg by mouth at bedtime.     Marland Kitchen LORazepam (ATIVAN) 1 MG tablet Take 1 mg by mouth 2 (two) times daily as needed for anxiety.    . methocarbamol (ROBAXIN) 500 MG tablet Take 1 tablet (500 mg total) by mouth every 6 (six) hours as needed for muscle spasms. 60 tablet 0  . mirtazapine (REMERON) 45 MG tablet Take 45 mg by mouth at bedtime.     . nabumetone (RELAFEN) 750 MG tablet Take 750 mg by mouth 2 (two) times daily.     Marland Kitchen oxyCODONE (OXY IR/ROXICODONE) 5 MG immediate release tablet Take 1-2 tablets (5-10 mg total) by mouth every 4 (four) hours as needed for breakthrough pain. 60 tablet 0  . PREMARIN 0.625 MG tablet Take 0.625 mg by mouth daily.     Marland Kitchen PROAIR HFA 108 (90 Base) MCG/ACT inhaler INHALE 2 PUFFS INTO THE LUNGS EVERY 4 (FOUR) HOURS AS NEEDED FOR WHEEZING. 8.5 g 0  . sertraline (ZOLOFT) 100 MG tablet Take 100 mg by mouth daily.      No current facility-administered medications on file prior to visit.  Review of Systems Review of Systems  Constitutional: Negative for fever, appetite change, fatigue and unexpected weight change.  Eyes: Negative for pain and visual disturbance.  Respiratory: Negative for cough and pos for baseline but improving  shortness of breath.   Cardiovascular: Negative for cp or palpitations    Gastrointestinal: Negative for nausea, diarrhea and constipation.  Genitourinary: Negative for  urgency and frequency. pos for lichen sclerosis  Skin: Negative for pallor or rash   MSK pos for chronic joint and myofascial pain (chronic) Neurological: Negative for weakness, light-headedness, numbness and headaches.  Hematological: Negative for adenopathy. Does not bruise/bleed easily.  Psychiatric/Behavioral: Negative for dysphoric mood. The patient is not nervous/anxious.         Objective:   Physical Exam  Constitutional: She appears well-developed and well-nourished. No distress.  obese and well appearing   HENT:  Head: Normocephalic and atraumatic.  Right Ear: External ear normal.  Left Ear: External ear normal.  Mouth/Throat: Oropharynx is clear and moist.  Eyes: Conjunctivae and EOM are normal. Pupils are equal, round, and reactive to light. No scleral icterus.  Neck: Normal range of motion. Neck supple. No JVD present. Carotid bruit is not present. No thyromegaly present.  Cardiovascular: Normal rate, regular rhythm, normal heart sounds and intact distal pulses.  Exam reveals no gallop.   Pulmonary/Chest: Effort normal and breath sounds normal. No respiratory distress. She has no wheezes. She exhibits no tenderness.  Diffusely distant bs   Abdominal: Soft. Bowel sounds are normal. She exhibits no distension, no abdominal bruit and no mass. There is no tenderness.  Genitourinary: No breast swelling, tenderness, discharge or bleeding.  Breast exam: No mass, nodules, thickening, tenderness, bulging, retraction, inflamation, nipple discharge or skin changes noted.  No axillary or clavicular LA.      Musculoskeletal: Normal range of motion. She exhibits no edema or tenderness.  Limited rom LS/hips/knees  Lymphadenopathy:    She has no cervical adenopathy.  Neurological: She is alert. She has normal reflexes. No cranial nerve deficit. She exhibits normal muscle tone. Coordination normal.  Skin: Skin is warm and dry. No rash noted. No erythema. No pallor.  Psychiatric: She has a  normal mood and affect.          Assessment & Plan:   Problem List Items Addressed This Visit      Respiratory   COPD with chronic bronchitis (HCC)    Overall clinically stable -no longer smoking (does vape non nicotine) Says breathing continues to improve        Endocrine   Hypothyroid - Primary    Hypothyroidism  Pt has no clinical changes No change in energy level/ hair or skin/ edema and no tremor Lab Results  Component Value Date   TSH 4.23 04/26/2015          Relevant Orders   TSH (Completed)     Musculoskeletal and Integument   Osteopenia    Will plan to order dexa when she is 78  Prev smoker but no falls/ fx and last dexa nl in 2013 Disc need for calcium/ vitamin D/ wt bearing exercise and bone density test every 2 y to monitor Disc safety/ fracture risk in detail          Other   Encounter for Medicare annual wellness exam    Reviewed health habits including diet and exercise and skin cancer prevention Reviewed appropriate screening tests for age  Also reviewed health mt list, fam hx and immunization status , as  well as social and family history   See HPI Labs reviewed  Don't forget to schedule your mammogram  Lab today  Please work on your advance directive (the blue booklet)  Follow up for vertigo if no improvement (make sure to ask for a 30 minute appointment)- or call back and ask for a referral to ENT  Next- after age 39 - you will need a prevnar vaccine and bone density test       HYPERCHOLESTEROLEMIA, PURE    Disc goals for lipids and reasons to control them Rev labs with pt Rev low sat fat diet in detail       Relevant Medications   simvastatin (ZOCOR) 20 MG tablet   Other Relevant Orders   CBC with Differential/Platelet (Completed)   Comprehensive metabolic panel (Completed)   Lipid panel (Completed)   Need for hepatitis C screening test   Relevant Orders   Hepatitis C antibody (Completed)   Screening for HIV (human  immunodeficiency virus)   Vitamin D deficiency    Improved with current supplementation- now in 30s  Disc imp to bone and overall health      Relevant Orders   VITAMIN D 25 Hydroxy (Vit-D Deficiency, Fractures) (Completed)

## 2015-04-26 NOTE — Progress Notes (Signed)
Pre visit review using our clinic review tool, if applicable. No additional management support is needed unless otherwise documented below in the visit note. 

## 2015-04-27 LAB — HEPATITIS C ANTIBODY: HCV Ab: NEGATIVE

## 2015-04-28 DIAGNOSIS — E669 Obesity, unspecified: Secondary | ICD-10-CM | POA: Insufficient documentation

## 2015-04-28 NOTE — Assessment & Plan Note (Signed)
Will plan to order dexa when she is 66  Prev smoker but no falls/ fx and last dexa nl in 2013 Disc need for calcium/ vitamin D/ wt bearing exercise and bone density test every 2 y to monitor Disc safety/ fracture risk in detail

## 2015-04-28 NOTE — Assessment & Plan Note (Signed)
Overall clinically stable -no longer smoking (does vape non nicotine) Says breathing continues to improve

## 2015-04-28 NOTE — Assessment & Plan Note (Addendum)
Reviewed health habits including diet and exercise and skin cancer prevention Reviewed appropriate screening tests for age  Also reviewed health mt list, fam hx and immunization status , as well as social and family history   See HPI Labs reviewed  Don't forget to schedule your mammogram  Lab today  Please work on your advance directive (the blue booklet)  Follow up for vertigo if no improvement (make sure to ask for a 30 minute appointment)- or call back and ask for a referral to ENT  Next- after age 65 - you will need a prevnar vaccine and bone density test

## 2015-04-28 NOTE — Assessment & Plan Note (Signed)
Hypothyroidism  Pt has no clinical changes No change in energy level/ hair or skin/ edema and no tremor Lab Results  Component Value Date   TSH 4.23 04/26/2015

## 2015-04-28 NOTE — Assessment & Plan Note (Signed)
Discussed how this problem influences overall health and the risks it imposes  Reviewed plan for weight loss with lower calorie diet (via better food choices and also portion control or program like weight watchers) and exercise building up to or more than 30 minutes 5 days per week including some aerobic activity    

## 2015-04-28 NOTE — Assessment & Plan Note (Signed)
Disc goals for lipids and reasons to control them Rev labs with pt Rev low sat fat diet in detail   

## 2015-04-28 NOTE — Assessment & Plan Note (Signed)
Improved with current supplementation- now in 30s  Disc imp to bone and overall health

## 2015-05-03 ENCOUNTER — Other Ambulatory Visit: Payer: Self-pay | Admitting: Family Medicine

## 2015-06-07 ENCOUNTER — Other Ambulatory Visit: Payer: Self-pay | Admitting: Family Medicine

## 2015-06-07 DIAGNOSIS — Z1231 Encounter for screening mammogram for malignant neoplasm of breast: Secondary | ICD-10-CM

## 2015-06-08 ENCOUNTER — Ambulatory Visit (INDEPENDENT_AMBULATORY_CARE_PROVIDER_SITE_OTHER): Payer: Medicare Other

## 2015-06-08 DIAGNOSIS — Z1231 Encounter for screening mammogram for malignant neoplasm of breast: Secondary | ICD-10-CM | POA: Diagnosis not present

## 2015-06-22 ENCOUNTER — Other Ambulatory Visit: Payer: Self-pay | Admitting: Obstetrics & Gynecology

## 2015-06-23 ENCOUNTER — Encounter: Payer: Self-pay | Admitting: *Deleted

## 2015-06-27 ENCOUNTER — Encounter: Payer: Self-pay | Admitting: Obstetrics & Gynecology

## 2015-06-29 ENCOUNTER — Other Ambulatory Visit: Payer: Self-pay | Admitting: *Deleted

## 2015-06-29 DIAGNOSIS — R232 Flushing: Secondary | ICD-10-CM

## 2015-06-29 MED ORDER — PREMARIN 0.625 MG PO TABS: 0.6250 mg | ORAL_TABLET | Freq: Every day | ORAL | Status: DC

## 2015-06-29 NOTE — Telephone Encounter (Signed)
Rf request for Premarin RF was sent to [facility] and prior authorization was obtained per Dr [name].

## 2015-07-01 ENCOUNTER — Ambulatory Visit (INDEPENDENT_AMBULATORY_CARE_PROVIDER_SITE_OTHER): Payer: Medicare Other | Admitting: Internal Medicine

## 2015-07-01 ENCOUNTER — Encounter: Payer: Self-pay | Admitting: Internal Medicine

## 2015-07-01 VITALS — BP 150/76 | HR 85 | Ht 67.0 in | Wt 204.8 lb

## 2015-07-01 DIAGNOSIS — J449 Chronic obstructive pulmonary disease, unspecified: Secondary | ICD-10-CM

## 2015-07-01 DIAGNOSIS — F17200 Nicotine dependence, unspecified, uncomplicated: Secondary | ICD-10-CM | POA: Diagnosis not present

## 2015-07-01 MED ORDER — AZITHROMYCIN 250 MG PO TABS: ORAL_TABLET | ORAL | Status: DC

## 2015-07-01 NOTE — Progress Notes (Signed)
Subjective:    Patient ID: Lori Orozco, female    DOB: [birth year], 65 y.o.   MRN: [id number]  HPI 09/07/10- 8 yoF smoker  followed for COPD and tobacco use.  Last here March 07, 2010 Says she was treated for pneumonia in June treated by an UC in [place]. F/U cxr showed clearing after Avelox. Has had pneumovax.  Still smoking 1 PPD. We discussed smoking cessation program. Discussed meds.  Today feels fine. Aware of some DOE if she hurries.   03/06/11- 60 yoF smoker  followed for COPD and tobacco use. Since last here she lost her mother and is grieving. Had flu vaccine. Still smoking about a pack a day. She had finger surgery under general anesthesia at [facility] and brings preoperative spirometry done 10/27/2010 which showed FEV1 1.54, FEV1/FVC 0.63 and FEF 25-75% 0.84, indicating moderate obstructive airways disease with some restriction of forced vital capacity, possibly air trapping. Recognizes morning cough with only scant white sputum. Denies chest pain fever or blood. Short of breath with brisk exertion. Chest x-ray: 02/12/2011-stable, no active disease, old right rib fracture, healed.  03/04/12 61 yoF smoker  followed for COPD and tobacco use. FOLLOWS FOR: breathing about the same since last visit; has had to use inhalers more than normal-recently been sick Prolonged bronchitis/ pneumonia in May required antibiotics and prednisone before clearing. A few months later another episode of bronchitis which resolved and then a third episode in October when she also had a tooth abscess requiring extraction and antibiotics. Still smokes and I pointed out the negative impact her smoking with this history of repeated bronchitis. Can't afford Spiriva. CXR 5/ 31 /13 IMPRESSION:  Patchy lingular opacity, possibly reflecting pneumonia.  Follow-up radiographs are suggested to document clearing following  resolution of therapy.  Original Report Authenticated By: Charline Bills, M.D.     07/01/12- 12 yoF smoker  followed for COPD and tobacco use. FOLLOWS FOR: good and bad days with breathing-sometimes struggle to walk long distances; Uses nebulizer and feels some what better. Still smoking one half pack per day despite all of the counseling. CXR 03/06/12 IMPRESSION:  No interval change or acute cardiopulmonary disease.  Original Report Authenticated By: Andreas Newport, M.D.   12/30/12- 72 yoF smoker  followed for COPD and tobacco use. Follows For:  SOB since on Breo - Prod cough (clear, brown) - Occas wheezing - Denies chest tightness She continues to smoke making no effort to stop despite my counseling and offers of help. Had steroid injection for disc disease. Considers Breo Ellipta a significant help. CXR 11/08/12 IMPRESSION:  No active cardiopulmonary disease.  Electronically Signed  By: Charlett Nose M.D.  On: 11/08/2012 18:10  06/29/13- 63 yoF Vape smoker  followed for COPD and nicotine use FOLLOWS FOR: Pt states she stopped smoking cigs 02-26-2013 and her breathing is doing great-no SOB, wheezing, or cough. Using mid-strength nicotine Vapes. Minor pollen rhinitis. R THR in January without respiratory complication CXR 03/16/13 IMPRESSION:  No active cardiopulmonary disease.  Electronically Signed  By: Rise Mu M.D.  On: 03/16/2013 15:06  12/29/13- 63 yoF Vape smoker  followed for COPD and nicotine use, complicated by BiPolar,  FOLLOWS FOR: Pt states she is just getting over a cold and just finished keflex. Pt denies change in SOB. Pt c/o dry hacky cough and chest tightness at night.   06/30/14- 63 yoF Vape smoker  followed for COPD and nicotine use, complicated by BiPolar,  FOLLOWS JKK:XFGHW good now.Sob same with exertion,denies wheeze and  cough,decreased energy. No tobacco in over a year and she is using Vapes with no nicotine, slowing down. She feels well. Z-Pak cleared a bronchitis. Discussed avoiding quinolone antibiotics because of peripheral  neuropathy-guided by her neurologist  07/01/2015-65 year old female VAPE smoker followed for COPD, nicotine use, complicated by bipolar FOLLOW FOR: breathing doing well.  no concerns.  CAT Score: 14  She says she is doing fine since quitting smoking 3 years ago. Still enjoys a nicotine- free VAPE. Only occasionally needs ProAir rescue inhaler Asks Z-Pak to hold CXR 07/01/2014-NAD  ROS-see HPI Constitutional:   No-   weight loss, night sweats, fevers, chills, fatigue, lassitude. HEENT:   No-  headaches, difficulty swallowing, tooth/dental problems, sore throat,       No-  sneezing, itching, ear ache, nasal congestion, post nasal drip,  CV:  No-   chest pain, orthopnea, PND, swelling in lower extremities, anasarca, dizziness, palpitations Resp: +shortness of breath with exertion or at rest.              No- productive cough,  + non-productive cough,  No- coughing up of blood.               No-   change in color of mucus.  No- wheezing.   Skin: No-   rash or lesions. GI:  No-   heartburn, indigestion, abdominal pain, nausea, vomiting,  GU: MS:  No-   joint pain or swelling.  . Neuro-     nothing unusual Psych:  No- change in mood or affect. No depression or anxiety.  No memory loss.  Objective:   Physical Exam  General- Alert, Oriented, Affect-appropriate, Distress- none acute.   Skin- rash-none, lesions- none, excoriation- none Lymphadenopathy- none Head- atraumatic            Eyes- Gross vision intact, PERRLA, conjunctivae clear secretions            Ears- Hearing, canals-normal            Nose- Clear, no-Septal dev, mucus, polyps, erosion, perforation             Throat- Mallampati III-IV , mucosa clear , drainage- none, tonsils- atrophic Neck- flexible , trachea midline, no stridor , thyroid nl, carotid no bruit Chest - symmetrical excursion , unlabored           Heart/CV- RRR , no murmur , no gallop  , no rub, nl s1 s2                           - JVD- none , edema- none, stasis  changes- none, varices- none           Lung- clear , unlabored, cough-none , dullness-none, rub- none           Chest wall-   Abd-  Br/ Gen/ Rectal- Not done, not indicated Extrem- cyanosis- none, clubbing, none, atrophy- none, strength- nl Neuro- + Jiggling right leg

## 2015-07-01 NOTE — Assessment & Plan Note (Signed)
She is pleased with her status and seems to feel stable and comfortable. No acute respiratory infection this winter. Plan-Z-Pak to hold as requested with discussion

## 2015-07-01 NOTE — Patient Instructions (Signed)
Refill print script for Zpak to hold in case needed  It will be fine for you to see Dr Milinda Antis as needed for your breathing problems. We will be happy to see you again if needed.

## 2015-07-01 NOTE — Assessment & Plan Note (Signed)
Encouraged to wean off of her VAPE smoking habit as able over this next year

## 2015-07-27 ENCOUNTER — Ambulatory Visit (INDEPENDENT_AMBULATORY_CARE_PROVIDER_SITE_OTHER): Payer: Medicare Other | Admitting: Primary Care

## 2015-07-27 ENCOUNTER — Encounter: Payer: Self-pay | Admitting: Primary Care

## 2015-07-27 VITALS — BP 146/80 | HR 75 | Temp 98.0°F | Ht 67.0 in | Wt 203.4 lb

## 2015-07-27 DIAGNOSIS — R32 Unspecified urinary incontinence: Secondary | ICD-10-CM

## 2015-07-27 DIAGNOSIS — N3941 Urge incontinence: Secondary | ICD-10-CM | POA: Insufficient documentation

## 2015-07-27 LAB — POC URINALSYSI DIPSTICK (AUTOMATED)
BILIRUBIN UA: NEGATIVE
Glucose, UA: NEGATIVE
KETONES UA: NEGATIVE
LEUKOCYTES UA: NEGATIVE
Nitrite, UA: NEGATIVE
PH UA: 6
Protein, UA: NEGATIVE
RBC UA: NEGATIVE
SPEC GRAV UA: 1.015
Urobilinogen, UA: NEGATIVE

## 2015-07-27 MED ORDER — MIRABEGRON ER 25 MG PO TB24: 25.0000 mg | ORAL_TABLET | Freq: Every day | ORAL | Status: DC

## 2015-07-27 NOTE — Patient Instructions (Signed)
Start Myrbetriq 25 mg tablets for overactive bladder and urinary incontinence. Take 1 tablet by mouth once daily.  Follow up with Dr. Milinda Antis in 4 weeks for re-evaluation of urinary symptoms.  Please call sooner if symptoms become worse or if you notice burning with urination, fevers, abdominal pain.  It was a pleasure meeting you!  Urinary Incontinence Urinary incontinence is the involuntary loss of urine from your bladder. CAUSES  There are many causes of urinary incontinence. They include:  Medicines.  Infections.  Prostatic enlargement, leading to overflow of urine from your bladder.  Surgery.  Neurological diseases.  Emotional factors. SIGNS AND SYMPTOMS Urinary Incontinence can be divided into four types: 1. Urge incontinence. Urge incontinence is the involuntary loss of urine before you have the opportunity to go to the bathroom. There is a sudden urge to void but not enough time to reach a bathroom. 2. Stress incontinence. Stress incontinence is the sudden loss of urine with any activity that forces urine to pass. It is commonly caused by anatomical changes to the pelvis and sphincter areas of your body. 3. Overflow incontinence. Overflow incontinence is the loss of urine from an obstructed opening to your bladder. This results in a backup of urine and a resultant buildup of pressure within the bladder. When the pressure within the bladder exceeds the closing pressure of the sphincter, the urine overflows, which causes incontinence, similar to water overflowing a dam. 4. Total incontinence. Total incontinence is the loss of urine as a result of the inability to store urine within your bladder. DIAGNOSIS  Evaluating the cause of incontinence may require:  A thorough and complete medical and obstetric history.  A complete physical exam.  Laboratory tests such as a urine culture and sensitivities. When additional tests are indicated, they can include:  An ultrasound  exam.  Kidney and bladder X-rays.  Cystoscopy. This is an exam of the bladder using a narrow scope.  Urodynamic testing to test the nerve function to the bladder and sphincter areas. TREATMENT  Treatment for urinary incontinence depends on the cause:  For urge incontinence caused by a bacterial infection, antibiotics will be prescribed. If the urge incontinence is related to medicines you take, your health care provider may have you change the medicine.  For stress incontinence, surgery to re-establish anatomical support to the bladder or sphincter, or both, will often correct the condition.  For overflow incontinence caused by an enlarged prostate, an operation to open the channel through the enlarged prostate will allow the flow of urine out of the bladder. In women with fibroids, a hysterectomy may be recommended.  For total incontinence, surgery on your urinary sphincter may help. An artificial urinary sphincter (an inflatable cuff placed around the urethra) may be required. In women who have developed a hole-like passage between their bladder and vagina (vesicovaginal fistula), surgery to close the fistula often is required. HOME CARE INSTRUCTIONS  Normal daily hygiene and the use of pads or adult diapers that are changed regularly will help prevent odors and skin damage.  Avoid caffeine. It can overstimulate your bladder.  Use the bathroom regularly. Try about every 2-3 hours to go to the bathroom, even if you do not feel the need to do so. Take time to empty your bladder completely. After urinating, wait a minute. Then try to urinate again.  For causes involving nerve dysfunction, keep a log of the medicines you take and a journal of the times you go to the bathroom. SEEK MEDICAL CARE  IF:  You experience worsening of pain instead of improvement in pain after your procedure.  Your incontinence becomes worse instead of better. SEE IMMEDIATE MEDICAL CARE IF:  You experience fever  or shaking chills.  You are unable to pass your urine.  You have redness spreading into your groin or down into your thighs. MAKE SURE YOU:   Understand these instructions.   Will watch your condition.  Will get help right away if you are not doing well or get worse.   This information is not intended to replace advice given to you by your health care provider. Make sure you discuss any questions you have with your health care provider.   Document Released: 03/22/2004 Document Revised: 03/05/2014 Document Reviewed: 07/22/2012 Elsevier Interactive Patient Education [employer]

## 2015-07-27 NOTE — Assessment & Plan Note (Signed)
Long history of urinary frequency, no history of diabetes or hyperglycemia. 1 month of nocturia. Now incontinent of urine. Once managed on medication for incontinence, could not tolerate.   No other obvious cause of urinary frequency or incontinence. UA today: Negative. Unlikely diabetes (normal CMP in 03/2015), no pelvic pressure, new meds.  Discussed her heavy caffeine consumption likely attributing to frequency. Given recent changes will trial her on low dose Myrbetriq for urinary incontinence. If no improvement may need to send to Urology for further testing/evaluation. She is to follow up with myself or PCP in 4 weeks for re-evaluation or sooner if needed.

## 2015-07-27 NOTE — Progress Notes (Signed)
Pre visit review using our clinic review tool, if applicable. No additional management support is needed unless otherwise documented below in the visit note. 

## 2015-07-27 NOTE — Progress Notes (Signed)
Subjective:    Patient ID: Lori Orozco, female    DOB: [birth year], 65 y.o.   MRN: [id number]  HPI  Lori Orozco is a 65 year old female who presents today with a chief complaint of urinary incontinence. She has a long standing history of increased urinary frequency that has been ongoing for years. She will urinate several times in an hour. She drinks mainly diet mountain dew with little to no water intake.   Over the past month she's noticed nocturia which is not normal for her. Last weekend she was standing in the kitchen and urinated on herself on two separate occasions. Tuesday morning she woke up and found that she wet the bed that prior night.   She was once on placed on a medication per Urology to help "calm" her bladder which caused pressure and discomfort so she stopped taking. She had a partial hysterectomy 35 years ago. Denies vaginal pressure, hematuria, dysuria, vaginal discharge, fevers, abdominal pain, rectal pressure, new medications, new foods, history of hyperglycemia. She's experiencing regular bowel movements per her norm.   Review of Systems  Gastrointestinal: Negative for abdominal pain, diarrhea and constipation.  Genitourinary: Positive for frequency. Negative for dysuria, hematuria, flank pain, vaginal discharge and pelvic pain.  Neurological: Negative for weakness.       Past Medical History  Diagnosis Date  . COPD (chronic obstructive pulmonary disease) (HCC)   . Chronic fatigue syndrome   . Hyperlipidemia   . Anxiety   . Depression   . Irritable bowel syndrome   . Migraines   . Amaurosis fugax   . Asthma   . Cataract   . Emphysema of lung (HCC)   . Thyroid disease     Graves  . Lumbar herniated disc   . PONV (postoperative nausea and vomiting)   . Hypothyroidism   . Pneumonia   . Chronic kidney disease     frequency, nephritis when 65 yrs old  . GERD (gastroesophageal reflux disease)     occ  . H/O hiatal hernia   . Fibromyalgia   .  Anemia     hx  . Shortness of breath     exertion   . History of bronchitis   . Urinary tract infection   . Urinary frequency   . Arthritis   . Diverticulitis   . Vertigo   . Lichen sclerosus   . Interstitial cystitis      Social History   Social History  . Marital Status: Single    Spouse Name: N/A  . Number of Children: N/A  . Years of Education: N/A   Occupational History  . Not on file.   Social History Main Topics  . Smoking status: Former Smoker -- 2.00 packs/day for 43 years    Types: Cigarettes    Quit date: 02/26/2013  . Smokeless tobacco: Never Used     Comment: "vaping now"  . Alcohol Use: No  . Drug Use: No  . Sexual Activity: Not on file   Other Topics Concern  . Not on file   Social History Narrative    Past Surgical History  Procedure Laterality Date  . Temporomandibular joint surgery    . Abdominal hysterectomy    . Dexa-osteopenia    . Epicondylitis    . Foot surgery Bilateral   . Carotid dopplar    . Doppler echocardiography    . Tonsillectomy    . Knee surgery      Left cartilage  .  Trigger finger release Right   . Colonoscopy  05/26/2010    avms- otherwise nl , re check 10y  . Plantar fascia surgery Left   . Wrist arthroscopy Right     ligament tear  . Eye surgery Bilateral     cataracts  . Total hip arthroplasty Right 03/20/2013    Procedure: Right TOTAL HIP ARTHROPLASTY;  Surgeon: [name], MD;  Location: [facility] OR;  Service: Orthopedics;  Laterality: Right;  Right Total Hip Arthroplasty  . Bladder sugery     . Lipoma in second finger right hand    . Total knee arthroplasty Left 01/28/2015    Procedure: LEFT TOTAL KNEE ARTHROPLASTY;  Surgeon: [name], MD;  Location: [facility];  Service: Orthopedics;  Laterality: Left;    Family History  Problem Relation Age of Onset  . Thyroid disease Mother   . Hypertension Mother   . Kidney disease Mother   . Cancer Father   . Colon cancer Other     Allergies  Allergen  Reactions  . Lithium Anaphylaxis  . Tegretol [Carbamazepine] Other (See Comments)    Fever and body aches (fever over 103)  . Tricyclic Antidepressants Anaphylaxis  . Codeine Nausea Only    Makes pt stay awake  . Cymbalta [Duloxetine Hcl] Other (See Comments)    Makes pt pass out   . Erythromycin     REACTION: abdominal pain  . Lyrica [Pregabalin]     Felt faint  . Neurontin [Gabapentin]     Passes  out  . Rabeprazole Sodium     REACTION: insomnia  . Duraprep [employer], Misc.] Itching and Rash    Tolerates Betadine   . Penicillins Rash    Has patient had a PCN reaction causing immediate rash, facial/tongue/throat swelling, SOB or lightheadedness with hypotension: Yes Has patient had a PCN reaction causing severe rash involving mucus membranes or skin necrosis: No Has patient had a PCN reaction that required hospitalization No Has patient had a PCN reaction occurring within the last 10 years: No If all of the above answers are "NO", then may proceed with Cephalosporin use.   . Tape Rash    PT ALLERGIC NYLON TAPE     Current Outpatient Prescriptions on File Prior to Visit  Medication Sig Dispense Refill  . aspirin 81 MG chewable tablet Chew 81 mg by mouth at bedtime.     Marland Kitchen buPROPion (WELLBUTRIN XL) 150 MG 24 hr tablet Take 150 mg by mouth daily.      . busPIRone (BUSPAR) 15 MG tablet Take 15 mg by mouth 2 (two) times daily before a meal.     . Cholecalciferol (VITAMIN D3) 2000 UNITS TABS Take 2,000 Units by mouth daily.    . clobetasol cream (TEMOVATE) 0.05 % APPLY SMALL AMOUNT TO AFFECTED AREAS 2 TO 3 TIMES PER WEEK (Patient taking differently: APPLY SMALL AMOUNT TO AFFECTED AREAS 3 TIMES PER WEEK) 30 g 11  . cyanocobalamin 2000 MCG tablet Take 2,000 mcg by mouth daily.    . divalproex (DEPAKOTE ER) 500 MG 24 hr tablet Take 500 mg by mouth every evening.     . fluticasone (FLONASE) 50 MCG/ACT nasal spray PLACE 2 SPRAYS INTO THE NOSE DAILY. 16 g 0  .  HYDROcodone-acetaminophen (NORCO/VICODIN) 5-325 MG tablet Take 1 tablet by mouth every 6 (six) hours as needed for moderate pain. Reported on 07/27/2015    . levothyroxine (SYNTHROID, LEVOTHROID) 50 MCG tablet TAKE 1 TABLET (50 MCG TOTAL) BY MOUTH  DAILY. 30 tablet 5  . LORazepam (ATIVAN) 1 MG tablet Take 1 mg by mouth 2 (two) times daily as needed for anxiety.    . methocarbamol (ROBAXIN) 500 MG tablet Take 1 tablet (500 mg total) by mouth every 6 (six) hours as needed for muscle spasms. 60 tablet 0  . nabumetone (RELAFEN) 750 MG tablet Take 750 mg by mouth 2 (two) times daily.     Marland Kitchen oxyCODONE (OXY IR/ROXICODONE) 5 MG immediate release tablet Take 1-2 tablets (5-10 mg total) by mouth every 4 (four) hours as needed for breakthrough pain. 60 tablet 0  . PREMARIN 0.625 MG tablet Take 1 tablet (0.625 mg total) by mouth daily. 30 tablet 12  . PROAIR HFA 108 (90 Base) MCG/ACT inhaler INHALE 2 PUFFS INTO THE LUNGS EVERY 4 (FOUR) HOURS AS NEEDED FOR WHEEZING. 8.5 g 0  . sertraline (ZOLOFT) 100 MG tablet Take 100 mg by mouth daily.     . simvastatin (ZOCOR) 20 MG tablet Take 1 tablet (20 mg total) by mouth at bedtime. 30 tablet 11   No current facility-administered medications on file prior to visit.    BP 146/80 mmHg  Pulse 75  Temp(Src) 98 F (36.7 C) (Oral)  Ht 5\' 7"  (1.702 m)  Wt 203 lb 6.4 oz (92.262 kg)  BMI 31.85 kg/m2  SpO2 94%    Objective:   Physical Exam  Constitutional: She appears well-nourished.  Cardiovascular: Normal rate and regular rhythm.   Pulmonary/Chest: Effort normal and breath sounds normal.  Abdominal: Soft. Bowel sounds are normal. There is no tenderness.  Skin: Skin is warm and dry.          Assessment & Plan:

## 2015-08-18 ENCOUNTER — Ambulatory Visit (INDEPENDENT_AMBULATORY_CARE_PROVIDER_SITE_OTHER): Payer: Medicare Other | Admitting: Obstetrics & Gynecology

## 2015-08-18 ENCOUNTER — Encounter: Payer: Self-pay | Admitting: Obstetrics & Gynecology

## 2015-08-18 VITALS — BP 152/80 | HR 93 | Resp 16 | Ht 67.0 in | Wt 200.0 lb

## 2015-08-18 DIAGNOSIS — N904 Leukoplakia of vulva: Secondary | ICD-10-CM | POA: Diagnosis not present

## 2015-08-18 NOTE — Progress Notes (Signed)
   Subjective:    Patient ID: Lori Orozco, female    DOB: 02-24-1951, 65 y.o.   MRN: [id number]  HPI 64 yo SW G0 here for her 6 month vulva check due to her lichen. She is using her temovate, but is somewhat "slack" about it. She has no complaints.   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       Assessment & Plan:  Lichen- stable RTC 1 year/prn sooner Rec vuvlar exams monthly

## 2015-09-02 ENCOUNTER — Ambulatory Visit (INDEPENDENT_AMBULATORY_CARE_PROVIDER_SITE_OTHER): Payer: Medicare Other | Admitting: Family Medicine

## 2015-09-02 ENCOUNTER — Encounter: Payer: Self-pay | Admitting: Family Medicine

## 2015-09-02 VITALS — BP 138/70 | HR 85 | Temp 98.0°F | Ht 67.0 in | Wt 197.0 lb

## 2015-09-02 DIAGNOSIS — R32 Unspecified urinary incontinence: Secondary | ICD-10-CM

## 2015-09-02 DIAGNOSIS — N3941 Urge incontinence: Secondary | ICD-10-CM | POA: Diagnosis not present

## 2015-09-02 DIAGNOSIS — E669 Obesity, unspecified: Secondary | ICD-10-CM

## 2015-09-02 DIAGNOSIS — R030 Elevated blood-pressure reading, without diagnosis of hypertension: Secondary | ICD-10-CM | POA: Diagnosis not present

## 2015-09-02 DIAGNOSIS — I10 Essential (primary) hypertension: Secondary | ICD-10-CM | POA: Insufficient documentation

## 2015-09-02 DIAGNOSIS — IMO0001 Reserved for inherently not codable concepts without codable children: Secondary | ICD-10-CM

## 2015-09-02 MED ORDER — MIRABEGRON ER 25 MG PO TB24: 25.0000 mg | ORAL_TABLET | Freq: Every day | ORAL | Status: DC

## 2015-09-02 NOTE — Progress Notes (Signed)
Subjective:    Patient ID: Lori Orozco, female    DOB: 11/18/1950, 65 y.o.   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 recently  Still healing Waiting on pathology  (it started in April-went to the dentist)    Seen by Mayra Reel a month ago for urinary incontinence UA was negative Disc heavy caffeine consumption with her  Tried low dose of Myrbetriq  She has regular visits to gyn for lichen sclerosis  Doing better on Myrbetriq  Only once did she loose continence on the way to the bathroom  In the past that class of med gave her stomach cramps (detrol)  Pays 46$ but it is working  Also makes her feel like she needs to go less often   Does not have any stress incontinence   Her lichen sclerosis is improved   Wt is down 3 lb with bmi of 30  Cannot eat as much  Lost 6 lb by her scale   BP Readings from Last 3 Encounters:  09/02/15 146/74  08/18/15 152/80  07/27/15 146/80   mother had HTN  Re check 138/70  No headache/flushing/cp or other symptoms    Patient Active Problem List   Diagnosis Date Noted  . Elevated blood pressure 09/02/2015  . Urge incontinence 07/27/2015  . Obesity 04/28/2015  . Need for hepatitis C screening test 04/26/2015  . Screening for HIV (human immunodeficiency virus) 04/26/2015  . Osteoarthritis of left knee 01/28/2015  . Status post total left knee replacement 01/28/2015  . Vitamin D deficiency 04/27/2014  . Seasonal and perennial allergic rhinitis 06/17/2013  . S/P total hip arthroplasty 03/20/2013  . 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 Encounter for Medicare annual wellness exam 11/18/2012  . History of falling 11/18/2012  . Routine general medical examination at a health care facility 11/10/2012  . 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 Osteopenia 10/19/2011  . Other screening mammogram 10/19/2011  . Hypothyroid  10/19/2011  . Tobacco use disorder 03/07/2010  . ANEMIA 10/11/2009  . PERIPHERAL NEUROPATHY, FEET 08/05/2009  . BACK PAIN 08/05/2009  . HAND PAIN, BILATERAL 08/05/2009  . TREMOR 03/09/2008  . MENOPAUSAL SYNDROME 10/09/2007  . DEPRESSION, MAJOR 05/20/2007  . BIPOLAR AFFECTIVE DISORDER 05/20/2007  . ANXIETY 05/20/2007  . 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 History of Guillain-Barre syndrome 05/20/2007  . ESOPHAGEAL SPASM 05/20/2007  . HIATAL HERNIA 05/20/2007  . AMAUROSIS FUGAX 05/07/2007  . COPD mixed type (HCC) 03/27/2007  . HYPERCHOLESTEROLEMIA, PURE 12/10/2006  . SYMPTOM, SYNDROME, CHRONIC FATIGUE 12/10/2006   Past Medical History  Diagnosis Date  . COPD (chronic obstructive pulmonary disease) (HCC)   . Chronic fatigue syndrome   . Hyperlipidemia   . Anxiety   . Depression   . Irritable bowel syndrome   . Migraines   . Amaurosis fugax   . Asthma   . Cataract   . Emphysema of lung (HCC)   . Thyroid disease     Graves  . Lumbar herniated disc   . PONV (postoperative nausea and vomiting)   . Hypothyroidism   . Pneumonia   . Chronic kidney disease     frequency, nephritis when 65 yrs old  . GERD (gastroesophageal reflux disease)     occ  . H/O hiatal hernia   . Fibromyalgia   . Anemia     hx  . Shortness of  breath     exertion   . History of bronchitis   . Urinary tract infection   . Urinary frequency   . Arthritis   . Diverticulitis   . Vertigo   . Lichen sclerosus   . Interstitial cystitis    Past Surgical History  Procedure Laterality Date  . Temporomandibular joint surgery    . Abdominal hysterectomy    . Dexa-osteopenia    . Epicondylitis    . Foot surgery Bilateral   . Carotid dopplar    . Doppler echocardiography    . Tonsillectomy    . Knee surgery      Left cartilage  . Trigger finger release Right   . Colonoscopy  05/26/2010    avms- otherwise nl , re check 10y  . Plantar fascia surgery Left   . Wrist arthroscopy Right     ligament  tear  . Eye surgery Bilateral     cataracts  . Total hip arthroplasty Right 03/20/2013    Procedure: Right TOTAL HIP ARTHROPLASTY;  Surgeon: [name], MD;  Location: [facility];  Service: Orthopedics;  Laterality: Right;  Right Total Hip Arthroplasty  . Bladder sugery     . Lipoma in second finger right hand    . Total knee arthroplasty Left 01/28/2015    Procedure: LEFT TOTAL KNEE ARTHROPLASTY;  Surgeon: [name], MD;  Location: [facility];  Service: Orthopedics;  Laterality: Left;   Social History  Substance Use Topics  . Smoking status: Former Smoker -- 2.00 packs/day for 43 years    Types: Cigarettes    Quit date: 02/26/2013  . Smokeless tobacco: Never Used     Comment: "vaping now"  . Alcohol Use: No   Family History  Problem Relation Age of Onset  . Thyroid disease Mother   . Hypertension Mother   . Kidney disease Mother   . Cancer Father   . Colon cancer Other    Allergies  Allergen Reactions  . Lithium Anaphylaxis  . Tegretol [Carbamazepine] Other (See Comments)    Fever and body aches (fever over 103)  . Tricyclic Antidepressants Anaphylaxis  . Codeine Nausea Only    Makes pt stay awake  . Cymbalta [Duloxetine Hcl] Other (See Comments)    Makes pt pass out   . Erythromycin     REACTION: abdominal pain  . Lyrica [Pregabalin]     Felt faint  . Neurontin [Gabapentin]     Passes  out  . Rabeprazole Sodium     REACTION: insomnia  . Duraprep [employer], Misc.] Itching and Rash    Tolerates Betadine   . Penicillins Rash    Has patient had a PCN reaction causing immediate rash, facial/tongue/throat swelling, SOB or lightheadedness with hypotension: Yes Has patient had a PCN reaction causing severe rash involving mucus membranes or skin necrosis: No Has patient had a PCN reaction that required hospitalization No Has patient had a PCN reaction occurring within the last 10 years: No If all of the above answers are "NO", then may proceed with  Cephalosporin use.   . Tape Rash    PT ALLERGIC NYLON TAPE    Current Outpatient Prescriptions on File Prior to Visit  Medication Sig Dispense Refill  . aspirin 81 MG chewable tablet Chew 81 mg by mouth at bedtime.     Marland Kitchen buPROPion (WELLBUTRIN XL) 150 MG 24 hr tablet Take 150 mg by mouth daily.      . busPIRone (BUSPAR) 15 MG tablet  Take 15 mg by mouth 2 (two) times daily before a meal.     . Cholecalciferol (VITAMIN D3) 2000 UNITS TABS Take 2,000 Units by mouth daily.    . clobetasol cream (TEMOVATE) 0.05 % APPLY SMALL AMOUNT TO AFFECTED AREAS 2 TO 3 TIMES PER WEEK (Patient taking differently: APPLY SMALL AMOUNT TO AFFECTED AREAS 3 TIMES PER WEEK) 30 g 11  . cyanocobalamin 2000 MCG tablet Take 2,000 mcg by mouth daily.    . divalproex (DEPAKOTE ER) 500 MG 24 hr tablet Take 500 mg by mouth every evening.     . fluticasone (FLONASE) 50 MCG/ACT nasal spray PLACE 2 SPRAYS INTO THE NOSE DAILY. 16 g 0  . HYDROcodone-acetaminophen (NORCO/VICODIN) 5-325 MG tablet Take 1 tablet by mouth every 6 (six) hours as needed for moderate pain. Reported on 07/27/2015    . levothyroxine (SYNTHROID, LEVOTHROID) 50 MCG tablet TAKE 1 TABLET (50 MCG TOTAL) BY MOUTH DAILY. 30 tablet 5  . LORazepam (ATIVAN) 1 MG tablet Take 1 mg by mouth 2 (two) times daily as needed for anxiety.    . methocarbamol (ROBAXIN) 500 MG tablet Take 1 tablet (500 mg total) by mouth every 6 (six) hours as needed for muscle spasms. 60 tablet 0  . mirtazapine (REMERON) 30 MG tablet Take 30 mg by mouth at bedtime.     . nabumetone (RELAFEN) 750 MG tablet Take 750 mg by mouth 2 (two) times daily.     Marland Kitchen oxyCODONE (OXY IR/ROXICODONE) 5 MG immediate release tablet Take 1-2 tablets (5-10 mg total) by mouth every 4 (four) hours as needed for breakthrough pain. 60 tablet 0  . PREMARIN 0.625 MG tablet Take 1 tablet (0.625 mg total) by mouth daily. 30 tablet 12  . PROAIR HFA 108 (90 Base) MCG/ACT inhaler INHALE 2 PUFFS INTO THE LUNGS EVERY 4 (FOUR) HOURS  AS NEEDED FOR WHEEZING. 8.5 g 0  . sertraline (ZOLOFT) 100 MG tablet Take 100 mg by mouth daily.     . simvastatin (ZOCOR) 20 MG tablet Take 1 tablet (20 mg total) by mouth at bedtime. 30 tablet 11   No current facility-administered medications on file prior to visit.    Review of Systems Review of Systems  Constitutional: Negative for fever, appetite change, fatigue and unexpected weight change.  Eyes: Negative for pain and visual disturbance.  Respiratory: Negative for cough and shortness of breath.   Cardiovascular: Negative for cp or palpitations    Gastrointestinal: Negative for nausea, diarrhea and constipation.  Genitourinary: Negative for urgency and frequency. pos for improved urge incontinence  Skin: Negative for pallor or rash   Neurological: Negative for weakness, light-headedness, numbness and headaches.  Hematological: Negative for adenopathy. Does not bruise/bleed easily.  Psychiatric/Behavioral: Negative for dysphoric mood. The patient is not nervous/anxious.         Objective:   Physical Exam  Constitutional: She appears well-developed and well-nourished. No distress.  obese and well appearing   HENT:  Head: Normocephalic and atraumatic.  Mouth/Throat: Oropharynx is clear and moist.  Eyes: Conjunctivae and EOM are normal. Pupils are equal, round, and reactive to light.  Neck: Normal range of motion. Neck supple. No JVD present. Carotid bruit is not present. No thyromegaly present.  Cardiovascular: Normal rate, regular rhythm, normal heart sounds and intact distal pulses.  Exam reveals no gallop.   Pulmonary/Chest: Effort normal and breath sounds normal. No respiratory distress. She has no wheezes. She has no rales.  No crackles  Diffusely distant bs   Abdominal:  Soft. Bowel sounds are normal. She exhibits no distension, no abdominal bruit and no mass. There is no tenderness.  Musculoskeletal: She exhibits no edema.  Lymphadenopathy:    She has no cervical  adenopathy.  Neurological: She is alert. She has normal reflexes. No cranial nerve deficit. She exhibits normal muscle tone. Coordination normal.  Skin: Skin is warm and dry. No rash noted.  Psychiatric: She has a normal mood and affect.          Assessment & Plan:   Problem List Items Addressed This Visit      Other   Urge incontinence - Primary    Much improved with Mybertriq -pt is tolerating it well  Will continue this  Disc freeze and squeeze technique as well as kegel exercises      Relevant Medications   mirabegron ER (MYRBETRIQ) 25 MG TB24 tablet   Obesity    Discussed how this problem influences overall health and the risks it imposes  Reviewed plan for weight loss with lower calorie diet (via better food choices and also portion control or program like weight watchers) and exercise building up to or more than 30 minutes 5 days per week including some aerobic activity         Elevated blood pressure    BP: 138/70 mmHg  This has been elevated on and off  Disc risk of essential HTN given family hx  Enc wt loss Given copy of DASH eating plan  Disc exercise as tolerated F/u planned  Will treat if no improvement

## 2015-09-02 NOTE — Progress Notes (Signed)
Pre visit review using our clinic review tool, if applicable. No additional management support is needed unless otherwise documented below in the visit note. 

## 2015-09-02 NOTE — Patient Instructions (Signed)
To control blood pressure-keep working on weight loss  Check out the app : myfitnesspal  Goal is to stick to a calorie limit and also aim to work up to exercise 5 days per week  Do work on reducing caffeine for bladder  Continue the bladder medicine  Try to drink more water  Take a look at the [facility] eating plan  We will monitor you for hypertension in the future

## 2015-09-04 NOTE — Assessment & Plan Note (Signed)
BP: 138/70 mmHg  This has been elevated on and off  Disc risk of essential HTN given family hx  Enc wt loss Given copy of [facility] eating plan  Disc exercise as tolerated F/u planned  Will treat if no improvement

## 2015-09-04 NOTE — Assessment & Plan Note (Signed)
Discussed how this problem influences overall health and the risks it imposes  Reviewed plan for weight loss with lower calorie diet (via better food choices and also portion control or program like weight watchers) and exercise building up to or more than 30 minutes 5 days per week including some aerobic activity    

## 2015-09-04 NOTE — Assessment & Plan Note (Signed)
Much improved with Mybertriq -pt is tolerating it well  Will continue this  Disc freeze and squeeze technique as well as kegel exercises

## 2015-10-30 ENCOUNTER — Other Ambulatory Visit: Payer: Self-pay | Admitting: Family Medicine

## 2015-10-30 ENCOUNTER — Encounter: Payer: Self-pay | Admitting: Family Medicine

## 2015-12-06 ENCOUNTER — Ambulatory Visit (INDEPENDENT_AMBULATORY_CARE_PROVIDER_SITE_OTHER): Payer: Medicare Other

## 2015-12-06 DIAGNOSIS — Z23 Encounter for immunization: Secondary | ICD-10-CM

## 2015-12-06 NOTE — Progress Notes (Signed)
Pt requested to have flu and PCV13 vaccines today during nurse visit. Pt was provided patient education both verbally and in writing.

## 2015-12-19 ENCOUNTER — Telehealth (INDEPENDENT_AMBULATORY_CARE_PROVIDER_SITE_OTHER): Payer: Self-pay | Admitting: Family Medicine

## 2015-12-19 ENCOUNTER — Other Ambulatory Visit (INDEPENDENT_AMBULATORY_CARE_PROVIDER_SITE_OTHER): Payer: Self-pay | Admitting: Physical Medicine and Rehabilitation

## 2015-12-19 DIAGNOSIS — M545 Low back pain: Principal | ICD-10-CM

## 2015-12-19 DIAGNOSIS — G8929 Other chronic pain: Secondary | ICD-10-CM

## 2015-12-19 MED ORDER — BACLOFEN 10 MG PO TABS: 10.0000 mg | ORAL_TABLET | Freq: Three times a day (TID) | ORAL | 0 refills | Status: DC | PRN

## 2015-12-19 NOTE — Telephone Encounter (Signed)
I think I ordered baclofen on EPIC and it should have gone to pharmacy on record

## 2016-01-02 ENCOUNTER — Ambulatory Visit (INDEPENDENT_AMBULATORY_CARE_PROVIDER_SITE_OTHER): Payer: Self-pay | Admitting: Orthopedic Surgery

## 2016-01-02 ENCOUNTER — Other Ambulatory Visit: Payer: Self-pay | Admitting: Internal Medicine

## 2016-01-03 ENCOUNTER — Ambulatory Visit (INDEPENDENT_AMBULATORY_CARE_PROVIDER_SITE_OTHER): Payer: Medicare Other

## 2016-01-03 ENCOUNTER — Ambulatory Visit (INDEPENDENT_AMBULATORY_CARE_PROVIDER_SITE_OTHER): Payer: Self-pay

## 2016-01-03 ENCOUNTER — Ambulatory Visit (INDEPENDENT_AMBULATORY_CARE_PROVIDER_SITE_OTHER): Payer: Medicare Other | Admitting: Orthopedic Surgery

## 2016-01-03 ENCOUNTER — Encounter (INDEPENDENT_AMBULATORY_CARE_PROVIDER_SITE_OTHER): Payer: Self-pay | Admitting: Orthopedic Surgery

## 2016-01-03 VITALS — Ht 67.0 in | Wt 200.0 lb

## 2016-01-03 DIAGNOSIS — M25551 Pain in right hip: Secondary | ICD-10-CM

## 2016-01-03 DIAGNOSIS — M79642 Pain in left hand: Secondary | ICD-10-CM

## 2016-01-03 MED ORDER — NABUMETONE 750 MG PO TABS: 750.0000 mg | ORAL_TABLET | Freq: Two times a day (BID) | ORAL | 3 refills | Status: DC

## 2016-01-03 MED ORDER — METHOCARBAMOL 750 MG PO TABS: 750.0000 mg | ORAL_TABLET | Freq: Three times a day (TID) | ORAL | 0 refills | Status: DC | PRN

## 2016-01-03 MED ORDER — METHYLPREDNISOLONE ACETATE 40 MG/ML IJ SUSP
40.0000 mg | INTRAMUSCULAR | Status: AC | PRN
  Administered 2016-01-03: 40 mg

## 2016-01-03 MED ORDER — LIDOCAINE HCL 1 % IJ SOLN
1.0000 mL | INTRAMUSCULAR | Status: AC | PRN
  Administered 2016-01-03: 1 mL

## 2016-01-03 NOTE — Progress Notes (Signed)
Office Visit Note   Patient: Lori Orozco           Date of Birth: 11-11-1950           MRN: [id number] Visit Date: 01/03/2016              Requested by: [name], MD [address] [place], [place] [address] PCP: Roxy Manns, MD   Assessment & Plan: Visit Diagnoses:  1. Pain in right hip   2. Pain in left hand    Pain carpometacarpal joint left thumb with lateral right thigh pain Plan: Left thumb carpometacarpal joint was injected with 1 mL depth metal 40 mg and 1 mL of 1% lidocaine plain she tolerated this well. Recommended exercises to help with the strengthening for her right hip. I discussed that if this is in continuing ongoing problem we could consider a bone scan to further evaluate the potential cortical reaction to the prosthesis.  Follow-Up Instructions: Return if symptoms worsen or fail to improve.   Orders:  Orders Placed This Encounter  Procedures  . XR HIP UNILAT W OR W/O PELVIS 2-3 VIEWS RIGHT   Meds ordered this encounter  Medications  . nabumetone (RELAFEN) 750 MG tablet    Sig: Take 1 tablet (750 mg total) by mouth 2 (two) times daily.    Dispense:  60 tablet    Refill:  3  . methocarbamol (ROBAXIN) 750 MG tablet    Sig: Take 1 tablet (750 mg total) by mouth every 8 (eight) hours as needed for muscle spasms.    Dispense:  60 tablet    Refill:  0      Procedures: Hand/UE Inj Date/Time: 01/03/2016 11:20 AM Performed by: Mirabelle Cyphers V Authorized by: [name]   Consent Given by:  Patient Site marked: the procedure site was marked   Timeout: prior to procedure the correct patient, procedure, and site was verified   Indications:  Diagnostic and therapeutic Condition: de Quervain's   Site:  L extensor compartment 1 Prep: patient was prepped and draped in usual sterile fashion   Needle Size:  22 G Approach:  Dorsal Medications:  1 mL lidocaine 1 %; 40 mg methylPREDNISolone acetate 40 MG/ML Patient tolerance:  Patient tolerated  the procedure well with no immediate complications     Clinical Data: No additional findings.   Subjective: Chief Complaint  Patient presents with  . Left Thumb - Pain    Patient presents today for left thumb pain. She was last seen 11/17/15 but feels her thumb is no better today. She was unable to obtain voltaren gel due to expenses and insurance denial. She states after this was denied she had called back to the office and was placed on zanaflex which gave her nausea and dizziness and there after discontinued this medication. She called again and Dr. Alvester Morin had placed her on baclofen 10mg  but this also caused her dizziness and she discontinued this medication as well. She is currently taking robaxin 750mg  bid and relafen daily. She is requesting a refill on both robaxin and relafen today. She complains of catching at left thumb and pain localized to MCP joint.     Review of Systems   Objective: Vital Signs: Ht 5\' 7"  (1.702 m)   Wt 200 lb (90.7 kg)   BMI 31.32 kg/m   Physical Exam Patient is alert oriented no adenopathy well-dressed normal affect normal respiratory effort.  Patient has a normal gait. She has no  pain with range of motion of hip knee or ankle negative straight leg raise no radicular symptoms. Pain is primarily lateral thigh at the location of the distal tip of the stem of the total hip arthroplasty. No focal motor weakness in either lower extremity. Patient states her pain is worse with going up and down stairs. Examination of her left thumb she has pain to palpation carpometacarpal joint the first dorsal extensor compartment is nontender to palpation MCP joint is stable with no pain with range of motion. She has good active flexion-extension neurovascular intact.   Ortho Exam  Specialty Comments:  No specialty comments available.  Imaging: Xr Hip Unilat W Or W/o Pelvis 2-3 Views Right  Result Date: 01/03/2016 Two-view radiographs of the right hip shows equal leg  length shows no complicating features of the total hip arthroplasty on the right which has been in place for approximately 3 years. There is a little cortical thickening distal lateral which may attribute to her lateral thigh pain.    PMFS History: Patient Active Problem List   Diagnosis Date Noted  . Elevated blood pressure 09/02/2015  . Urge incontinence 07/27/2015  . Obesity 04/28/2015  . Need for hepatitis C screening test 04/26/2015  . Screening for HIV (human immunodeficiency virus) 04/26/2015  . Osteoarthritis of left knee 01/28/2015  . Status post total left knee replacement 01/28/2015  . Vitamin D deficiency 04/27/2014  . Seasonal and perennial allergic rhinitis 06/17/2013  . S/P total hip arthroplasty 03/20/2013  . 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 Encounter for Medicare annual wellness exam 11/18/2012  . History of falling 11/18/2012  . Routine general medical examination at a health care facility 11/10/2012  . 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 Osteopenia 10/19/2011  . Other screening mammogram 10/19/2011  . Hypothyroid 10/19/2011  . Tobacco use disorder 03/07/2010  . ANEMIA 10/11/2009  . PERIPHERAL NEUROPATHY, FEET 08/05/2009  . BACK PAIN 08/05/2009  . HAND PAIN, BILATERAL 08/05/2009  . TREMOR 03/09/2008  . MENOPAUSAL SYNDROME 10/09/2007  . DEPRESSION, MAJOR 05/20/2007  . BIPOLAR AFFECTIVE DISORDER 05/20/2007  . ANXIETY 05/20/2007  . 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 ESOPHAGEAL SPASM 05/20/2007  . HIATAL HERNIA 05/20/2007  . AMAUROSIS FUGAX 05/07/2007  . COPD mixed type (HCC) 03/27/2007  . HYPERCHOLESTEROLEMIA, PURE 12/10/2006  . SYMPTOM, SYNDROME, CHRONIC FATIGUE 12/10/2006   Past Medical History:  Diagnosis Date  . Amaurosis fugax   . Anemia    hx  . Anxiety   . Arthritis   . Asthma   . Cataract   . Chronic fatigue syndrome   . Chronic kidney disease    frequency, nephritis when 65 yrs old  . COPD  (chronic obstructive pulmonary disease) (HCC)   . Depression   . Diverticulitis   . Emphysema of lung (HCC)   . Fibromyalgia   . GERD (gastroesophageal reflux disease)    occ  . H/O hiatal hernia   . History of bronchitis   . Hyperlipidemia   . Hypothyroidism   . Interstitial cystitis   . Irritable bowel syndrome   . Lichen sclerosus   . Lumbar herniated disc   . Migraines   . Pneumonia   . PONV (postoperative nausea and vomiting)   . Shortness of breath    exertion   . Thyroid disease    Graves  . Urinary frequency   . Urinary tract infection   . Vertigo     Family History  Problem  Relation Age of Onset  . Thyroid disease Mother   . Hypertension Mother   . Kidney disease Mother   . Cancer Father   . Colon cancer Other     Past Surgical History:  Procedure Laterality Date  . ABDOMINAL HYSTERECTOMY    . bladder sugery     . CAROTID DOPPLAR    . COLONOSCOPY  05/26/2010   avms- otherwise nl , re check 10y  . DEXA-OSTEOPENIA    . DOPPLER ECHOCARDIOGRAPHY    . EPICONDYLITIS    . EYE SURGERY Bilateral    cataracts  . FOOT SURGERY Bilateral   . KNEE SURGERY     Left cartilage  . lipoma in second finger right hand    . PLANTAR FASCIA SURGERY Left   . TEMPOROMANDIBULAR JOINT SURGERY    . TONSILLECTOMY    . TOTAL HIP ARTHROPLASTY Right 03/20/2013   Procedure: Right TOTAL HIP ARTHROPLASTY;  Surgeon: Nadara MustardMarcus V Marvelle Caudill, MD;  Location: [facility];  Service: Orthopedics;  Laterality: Right;  Right Total Hip Arthroplasty  . TOTAL KNEE ARTHROPLASTY Left 01/28/2015   Procedure: LEFT TOTAL KNEE ARTHROPLASTY;  Surgeon: Kathryne Hitchhristopher Y Blackman, MD;  Location: [facility];  Service: Orthopedics;  Laterality: Left;  . TRIGGER FINGER RELEASE Right   . WRIST ARTHROSCOPY Right    ligament tear   Social History   Occupational History  . Not on file.   Social History Main Topics  . Smoking status: Former Smoker    Packs/day: 2.00    Years: 43.00    Types: Cigarettes    Quit date: 02/26/2013    . Smokeless tobacco: Never Used     Comment: "vaping now"  . Alcohol use No  . Drug use: No  . Sexual activity: Not on file

## 2016-01-04 ENCOUNTER — Telehealth: Payer: Self-pay | Admitting: Internal Medicine

## 2016-01-05 MED ORDER — DOXYCYCLINE HYCLATE 100 MG PO TABS: ORAL_TABLET | ORAL | 0 refills | Status: DC

## 2016-01-05 NOTE — Telephone Encounter (Signed)
Called spoke with patient and advised of CY's recommendations as stated by CY below Pt okay with this and voiced her understanding Rx sent to verified pharmacy Nothing further needed; will sign off

## 2016-01-05 NOTE — Telephone Encounter (Signed)
lmtcb x1 for pt. 

## 2016-01-05 NOTE — Telephone Encounter (Signed)
Pt returning call.Lori Orozco ° °

## 2016-01-05 NOTE — Telephone Encounter (Signed)
Called and spoke to pt. Pt c/o increase in prod cough with yellow mucus, body aches, chills, night sweats, and chest tightness at night x 3 days. Pt states she has not taken her temperature. Pt denies increase in SOB and wheezing. Pt is requesting a zpak.  Dr. Maple Hudson please advise. Thanks.   Allergies  Allergen Reactions  . Lithium Anaphylaxis  . Tegretol [Carbamazepine] Other (See Comments)    Fever and body aches (fever over 103)  . Tricyclic Antidepressants Anaphylaxis  . Codeine Nausea Only    Makes pt stay awake  . Cymbalta [Duloxetine Hcl] Other (See Comments)    Makes pt pass out   . Erythromycin     REACTION: abdominal pain  . Lyrica [Pregabalin]     Felt faint  . Neurontin [Gabapentin]     Passes  out  . Rabeprazole Sodium     REACTION: insomnia  . Duraprep [employer], Misc.] Itching and Rash    Tolerates Betadine   . Penicillins Rash    Has patient had a PCN reaction causing immediate rash, facial/tongue/throat swelling, SOB or lightheadedness with hypotension: Yes Has patient had a PCN reaction causing severe rash involving mucus membranes or skin necrosis: No Has patient had a PCN reaction that required hospitalization No Has patient had a PCN reaction occurring within the last 10 years: No If all of the above answers are "NO", then may proceed with Cephalosporin use.   . Tape Rash    PT ALLERGIC NYLON TAPE     Current Outpatient Prescriptions on File Prior to Visit  Medication Sig Dispense Refill  . aspirin 81 MG chewable tablet Chew 81 mg by mouth at bedtime.     . baclofen (LIORESAL) 10 MG tablet Take 1 tablet (10 mg total) by mouth every 8 (eight) hours as needed for muscle spasms (Pain). (Patient not taking: Reported on 01/03/2016) 60 tablet 0  . buPROPion (WELLBUTRIN XL) 150 MG 24 hr tablet Take 150 mg by mouth daily.      . busPIRone (BUSPAR) 15 MG tablet Take 15 mg by mouth 2 (two) times daily before a meal.     . Cholecalciferol (VITAMIN D3)  2000 UNITS TABS Take 2,000 Units by mouth daily.    . clobetasol cream (TEMOVATE) 0.05 % APPLY SMALL AMOUNT TO AFFECTED AREAS 2 TO 3 TIMES PER WEEK (Patient taking differently: APPLY SMALL AMOUNT TO AFFECTED AREAS 3 TIMES PER WEEK) 30 g 11  . cyanocobalamin 2000 MCG tablet Take 2,000 mcg by mouth daily.    . divalproex (DEPAKOTE ER) 500 MG 24 hr tablet Take 500 mg by mouth every evening.     . fluticasone (FLONASE) 50 MCG/ACT nasal spray PLACE 2 SPRAYS INTO THE NOSE DAILY. 16 g 0  . HYDROcodone-acetaminophen (NORCO/VICODIN) 5-325 MG tablet Take 1 tablet by mouth every 6 (six) hours as needed for moderate pain. Reported on 07/27/2015    . levothyroxine (SYNTHROID, LEVOTHROID) 50 MCG tablet TAKE 1 TABLET (50 MCG TOTAL) BY MOUTH DAILY. 30 tablet 5  . LORazepam (ATIVAN) 1 MG tablet Take 1 mg by mouth 2 (two) times daily as needed for anxiety.    . methocarbamol (ROBAXIN) 500 MG tablet Take 1 tablet (500 mg total) by mouth every 6 (six) hours as needed for muscle spasms. 60 tablet 0  . methocarbamol (ROBAXIN) 750 MG tablet Take 1 tablet (750 mg total) by mouth every 8 (eight) hours as needed for muscle spasms. 60 tablet 0  . mirabegron ER (MYRBETRIQ) 25  MG TB24 tablet Take 1 tablet (25 mg total) by mouth daily. 30 tablet 11  . mirtazapine (REMERON) 30 MG tablet Take 30 mg by mouth at bedtime.     . nabumetone (RELAFEN) 750 MG tablet Take 1 tablet (750 mg total) by mouth 2 (two) times daily. 60 tablet 3  . oxyCODONE (OXY IR/ROXICODONE) 5 MG immediate release tablet Take 1-2 tablets (5-10 mg total) by mouth every 4 (four) hours as needed for breakthrough pain. 60 tablet 0  . PREMARIN 0.625 MG tablet Take 1 tablet (0.625 mg total) by mouth daily. 30 tablet 12  . PROAIR HFA 108 (90 Base) MCG/ACT inhaler INHALE 2 PUFFS INTO THE LUNGS EVERY 4 (FOUR) HOURS AS NEEDED FOR WHEEZING. 8.5 g 0  . sertraline (ZOLOFT) 100 MG tablet Take 100 mg by mouth daily.     . simvastatin (ZOCOR) 20 MG tablet Take 1 tablet (20 mg  total) by mouth at bedtime. 30 tablet 11   No current facility-administered medications on file prior to visit.

## 2016-01-05 NOTE — Telephone Encounter (Signed)
Offer doxycycline 100 mg, # 8, 2 today then one daily  Stay well hydrated. Ok to use otc cough and cold remedies if helpful

## 2016-01-09 ENCOUNTER — Ambulatory Visit (INDEPENDENT_AMBULATORY_CARE_PROVIDER_SITE_OTHER): Payer: Self-pay

## 2016-01-09 ENCOUNTER — Ambulatory Visit (INDEPENDENT_AMBULATORY_CARE_PROVIDER_SITE_OTHER): Payer: Medicare Other

## 2016-01-09 ENCOUNTER — Ambulatory Visit (INDEPENDENT_AMBULATORY_CARE_PROVIDER_SITE_OTHER): Payer: Medicare Other | Admitting: Orthopaedic Surgery

## 2016-01-09 DIAGNOSIS — M25562 Pain in left knee: Secondary | ICD-10-CM

## 2016-01-09 DIAGNOSIS — G8929 Other chronic pain: Secondary | ICD-10-CM

## 2016-01-09 DIAGNOSIS — Z96652 Presence of left artificial knee joint: Secondary | ICD-10-CM

## 2016-01-09 DIAGNOSIS — M25551 Pain in right hip: Secondary | ICD-10-CM | POA: Diagnosis not present

## 2016-01-09 NOTE — Progress Notes (Signed)
Office Visit Note   Patient: Lori Orozco           Date of Birth: March 24, 1950           MRN: [id number] Visit Date: 01/09/2016              Requested by: [name], MD [address] [place], [place] [address] PCP: Roxy Manns, MD   Assessment & Plan: Visit Diagnoses:  1. History of total left knee replacement   2. Chronic pain of left knee   3. Pain in right hip     Plan: At this point an MRI of her right hip is warranted to rule out any type of pseudotumor or evidence of prosthetic loosening. Is been 3 years since this hip is been in and it continues to hurt her in the thigh with failure conservative treatment. We'll see her back after the MRI.  Follow-Up Instructions: No Follow-up on file.   Orders:  Orders Placed This Encounter  Procedures  . XR Knee 1-2 Views Left   No orders of the defined types were placed in this encounter.     Procedures: No procedures performed   Clinical Data: No additional findings.   Subjective: Chief Complaint  Patient presents with  . Left Knee - Follow-up  . Right Hip - Follow-up  She is almost 65 years status post a left total knee arthroplasty and has no complaints of that left knee. She is 65 years status post a right total hip arthroplasty done by another surgeon. She still has thigh pain. I did inject around her IT band where she is most tender at her last visit and she said this didn't help at all. She still concern a while that hip hurts. She had x-rays a week ago and these were unremarkable  HPI  Review of Systems Negative for headache, shortness of breath, chest pain, fever, chills, nausea, vomiting  Objective: Vital Signs: There were no vitals taken for this visit.  Physical Exam Alert and oriented 3, no acute distress Ortho Exam Examination of her left knee shows no effusion with full range of motion. There is slight laxity in the ligaments but this is asymptomatic to her. Examination of her right hip  shows fluid range of motion of the hip. There is no pain in the groin. There is only pain in the mid thigh and some over the IT band. Specialty Comments:  No specialty comments available.  Imaging: No results found.   PMFS History: Patient Active Problem List   Diagnosis Date Noted  . Elevated blood pressure 09/02/2015  . Urge incontinence 07/27/2015  . Obesity 04/28/2015  . Need for hepatitis C screening test 04/26/2015  . Screening for HIV (human immunodeficiency virus) 04/26/2015  . Osteoarthritis of left knee 01/28/2015  . Status post total left knee replacement 01/28/2015  . Vitamin D deficiency 04/27/2014  . Seasonal and perennial allergic rhinitis 06/17/2013  . S/P total hip arthroplasty 03/20/2013  . 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 Encounter for Medicare annual wellness exam 11/18/2012  . History of falling 11/18/2012  . Routine general medical examination at a health care facility 11/10/2012  . 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 Osteopenia 10/19/2011  . Other screening mammogram 10/19/2011  . Hypothyroid 10/19/2011  . Tobacco use disorder 03/07/2010  . ANEMIA 10/11/2009  . PERIPHERAL NEUROPATHY, FEET 08/05/2009  . BACK PAIN 08/05/2009  . HAND PAIN, BILATERAL 08/05/2009  .  TREMOR 03/09/2008  . MENOPAUSAL SYNDROME 10/09/2007  . DEPRESSION, MAJOR 05/20/2007  . BIPOLAR AFFECTIVE DISORDER 05/20/2007  . ANXIETY 05/20/2007  . 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 ESOPHAGEAL SPASM 05/20/2007  . HIATAL HERNIA 05/20/2007  . AMAUROSIS FUGAX 05/07/2007  . COPD mixed type (HCC) 03/27/2007  . HYPERCHOLESTEROLEMIA, PURE 12/10/2006  . SYMPTOM, SYNDROME, CHRONIC FATIGUE 12/10/2006   Past Medical History:  Diagnosis Date  . Amaurosis fugax   . Anemia    hx  . Anxiety   . Arthritis   . Asthma   . Cataract   . Chronic fatigue syndrome   . Chronic kidney disease    frequency, nephritis when 65 yrs old  . COPD (chronic  obstructive pulmonary disease) (HCC)   . Depression   . Diverticulitis   . Emphysema of lung (HCC)   . Fibromyalgia   . GERD (gastroesophageal reflux disease)    occ  . H/O hiatal hernia   . History of bronchitis   . Hyperlipidemia   . Hypothyroidism   . Interstitial cystitis   . Irritable bowel syndrome   . Lichen sclerosus   . Lumbar herniated disc   . Migraines   . Pneumonia   . PONV (postoperative nausea and vomiting)   . Shortness of breath    exertion   . Thyroid disease    Graves  . Urinary frequency   . Urinary tract infection   . Vertigo     Family History  Problem Relation Age of Onset  . Thyroid disease Mother   . Hypertension Mother   . Kidney disease Mother   . Cancer Father   . Colon cancer Other     Past Surgical History:  Procedure Laterality Date  . ABDOMINAL HYSTERECTOMY    . bladder sugery     . CAROTID DOPPLAR    . COLONOSCOPY  05/26/2010   avms- otherwise nl , re check 10y  . DEXA-OSTEOPENIA    . DOPPLER ECHOCARDIOGRAPHY    . EPICONDYLITIS    . EYE SURGERY Bilateral    cataracts  . FOOT SURGERY Bilateral   . KNEE SURGERY     Left cartilage  . lipoma in second finger right hand    . PLANTAR FASCIA SURGERY Left   . TEMPOROMANDIBULAR JOINT SURGERY    . TONSILLECTOMY    . TOTAL HIP ARTHROPLASTY Right 03/20/2013   Procedure: Right TOTAL HIP ARTHROPLASTY;  Surgeon: [name], MD;  Location: [facility];  Service: Orthopedics;  Laterality: Right;  Right Total Hip Arthroplasty  . TOTAL KNEE ARTHROPLASTY Left 01/28/2015   Procedure: LEFT TOTAL KNEE ARTHROPLASTY;  Surgeon: [name], MD;  Location: [facility];  Service: Orthopedics;  Laterality: Left;  . TRIGGER FINGER RELEASE Right   . WRIST ARTHROSCOPY Right    ligament tear   Social History   Occupational History  . Not on file.   Social History Main Topics  . Smoking status: Former Smoker    Packs/day: 2.00    Years: 43.00    Types: Cigarettes    Quit date: 02/26/2013  .  Smokeless tobacco: Never Used     Comment: "vaping now"  . Alcohol use No  . Drug use: No  . Sexual activity: Not on file

## 2016-01-09 NOTE — Addendum Note (Signed)
Addended by: Shonna Chock on: 01/09/2016 04:34 PM   Modules accepted: Orders

## 2016-01-13 ENCOUNTER — Other Ambulatory Visit: Payer: Self-pay | Admitting: Family Medicine

## 2016-01-23 ENCOUNTER — Ambulatory Visit
Admission: RE | Admit: 2016-01-23 | Discharge: 2016-01-23 | Disposition: A | Discharge status: Home / Self Care | Payer: Medicare Other | Source: Ambulatory Visit | Attending: Orthopaedic Surgery | Admitting: Orthopaedic Surgery

## 2016-01-23 DIAGNOSIS — M25551 Pain in right hip: Secondary | ICD-10-CM

## 2016-01-25 ENCOUNTER — Other Ambulatory Visit: Payer: Self-pay | Admitting: Obstetrics & Gynecology

## 2016-01-26 ENCOUNTER — Telehealth (INDEPENDENT_AMBULATORY_CARE_PROVIDER_SITE_OTHER): Payer: Self-pay | Admitting: Orthopaedic Surgery

## 2016-01-26 NOTE — Telephone Encounter (Signed)
Please advise 

## 2016-02-06 ENCOUNTER — Ambulatory Visit (INDEPENDENT_AMBULATORY_CARE_PROVIDER_SITE_OTHER): Payer: Medicare Other | Admitting: Orthopaedic Surgery

## 2016-02-06 DIAGNOSIS — M25551 Pain in right hip: Secondary | ICD-10-CM

## 2016-02-06 NOTE — Progress Notes (Signed)
The patient is here for follow-up for her right hip after having an MRI of this hip. She had a total hip replacement done several years ago by one of my partners. She still complains of deep thigh pain and pain over the IT band. We have tried and failed all forms of conservative treatment other than physical therapy. We sent her for an MRI to rule out prosthetic loosening or even a pseudo-tumor area  MRI is reviewed with her and shows no complications age the prosthesis at all. I see no evidence of stress fracture or fluid collection around the hip.  On examination she still hurts directly over the IT band at the mid thigh area.  At this point we'll send her to outpatient physical therapy to work specifically on varus modalities to try to improve her gait and decrease her pain as well as improving her hip strength. We'll see her back in about 6 weeks to see how she is doing overall.

## 2016-03-21 ENCOUNTER — Ambulatory Visit (INDEPENDENT_AMBULATORY_CARE_PROVIDER_SITE_OTHER): Payer: Medicare Other | Admitting: Orthopaedic Surgery

## 2016-03-21 DIAGNOSIS — M25551 Pain in right hip: Secondary | ICD-10-CM

## 2016-03-21 NOTE — Progress Notes (Signed)
The patient still complains of right hip and thigh pain. She is someone who had a right total hip replacement done by another physician. We obtained an MRI of her right hip recently that was unremarkable. She still has a deep thigh pain and outside injection over the IT band and that did not help her at all. She's been to rest, ice, heat, anti-inflammatories, activity modification, physical therapy and again an injection. She is frustrated.  On examination her pain is deep in the right thigh with good range of motion of right hip and appears stable.  At this point a 3 phase bone scan is warranted to rule out prosthetic loosening. Sometimes this can be more sensitive than an MRI. She understands this. We will see her back after 3 days bone scan. I did refill her hydrocodone. Had a discussion about narcotics use.

## 2016-03-22 ENCOUNTER — Other Ambulatory Visit (INDEPENDENT_AMBULATORY_CARE_PROVIDER_SITE_OTHER): Payer: Self-pay

## 2016-03-22 ENCOUNTER — Ambulatory Visit (INDEPENDENT_AMBULATORY_CARE_PROVIDER_SITE_OTHER): Payer: Medicare Other | Admitting: Orthopaedic Surgery

## 2016-03-22 DIAGNOSIS — M25551 Pain in right hip: Secondary | ICD-10-CM

## 2016-03-29 ENCOUNTER — Ambulatory Visit (HOSPITAL_COMMUNITY): Payer: Medicare Other

## 2016-03-29 ENCOUNTER — Ambulatory Visit (HOSPITAL_COMMUNITY)
Admission: RE | Admit: 2016-03-29 | Discharge: 2016-03-29 | Disposition: A | Discharge status: Home / Self Care | Payer: Medicare Other | Source: Ambulatory Visit | Attending: Orthopaedic Surgery | Admitting: Orthopaedic Surgery

## 2016-03-29 DIAGNOSIS — M25551 Pain in right hip: Secondary | ICD-10-CM | POA: Insufficient documentation

## 2016-03-29 DIAGNOSIS — Z96641 Presence of right artificial hip joint: Secondary | ICD-10-CM | POA: Insufficient documentation

## 2016-03-29 MED ORDER — TECHNETIUM TC 99M MEDRONATE IV KIT
25.0000 | PACK | Freq: Once | INTRAVENOUS | Status: AC | PRN
  Administered 2016-03-29: 25 via INTRAVENOUS

## 2016-04-02 ENCOUNTER — Telehealth (INDEPENDENT_AMBULATORY_CARE_PROVIDER_SITE_OTHER): Payer: Self-pay | Admitting: Orthopaedic Surgery

## 2016-04-02 NOTE — Telephone Encounter (Signed)
Please advise 

## 2016-04-02 NOTE — Telephone Encounter (Signed)
PATIENT CALLED WANTING THE RESULTS FROM HER BONE SCAN LAST THURS. CB # [phone]

## 2016-04-10 ENCOUNTER — Ambulatory Visit (INDEPENDENT_AMBULATORY_CARE_PROVIDER_SITE_OTHER): Payer: Medicare Other | Admitting: Orthopaedic Surgery

## 2016-04-10 ENCOUNTER — Telehealth (INDEPENDENT_AMBULATORY_CARE_PROVIDER_SITE_OTHER): Payer: Self-pay | Admitting: Orthopaedic Surgery

## 2016-04-10 ENCOUNTER — Encounter (INDEPENDENT_AMBULATORY_CARE_PROVIDER_SITE_OTHER): Payer: Self-pay | Admitting: Orthopaedic Surgery

## 2016-04-10 VITALS — Ht 67.0 in | Wt 200.0 lb

## 2016-04-10 DIAGNOSIS — M7061 Trochanteric bursitis, right hip: Secondary | ICD-10-CM

## 2016-04-10 NOTE — Telephone Encounter (Signed)
Patient seen by [facility] today and is interested in giving you a surgery date to work with if possible.  March 8th would be good for her so that she will have her roommate (who will be on vacation) for transportation and post op care.  cb  [phone]

## 2016-04-10 NOTE — Progress Notes (Signed)
The patient is well-known to [place]. She had a right total hip arthroplasty done by another surgeon about 3 years ago. She's had chronic pain in that right hip since then. She is here for review of a 3 phase bone scan to rule out prosthetic loosening. We are he had plain films and an MRI that showed no, getting features.  On exam she still tender over the trochanteric area and IT band she is responded to injections in this area in the past. The three-phase bone scan also showed no evidence of prosthetic loosening and reviewed all the studies again including the plain films and MRI and a 3 phase bone scan.  At this point I would not recommend any type of revision surgery due to the well seated nature of this implant. I do feel that should benefit at this point from release of the IT band over the greater trochanteric area with a bursectomy in this area and I think that this could help alleviate her symptoms. I talked to her thoroughly about this and explained in detail the risk and benefits of the surgery. This can be done as an outpatient I will let her weight-bear as tolerated after surgery leaving let her drive the next day. My only restrictions of the right having her sleep on that side as well as avoiding high-impact aerobic activities for a month. Also order to lay off of bowling for a month after surgery as well. We'll work on getting this set up for her and we'll see her back in 2 weeks postoperative. All questions were encouraged and answered.

## 2016-04-13 NOTE — Telephone Encounter (Signed)
Spk with patient yesterday and scheduled surgery.

## 2016-04-16 ENCOUNTER — Other Ambulatory Visit: Payer: Self-pay | Admitting: Family Medicine

## 2016-04-27 ENCOUNTER — Other Ambulatory Visit: Payer: Self-pay | Admitting: Family Medicine

## 2016-05-03 DIAGNOSIS — M7071 Other bursitis of hip, right hip: Secondary | ICD-10-CM

## 2016-05-03 HISTORY — PX: TROCHANTERIC BURSA EXCISION: SHX2581

## 2016-05-06 ENCOUNTER — Telehealth: Payer: Self-pay | Admitting: Family Medicine

## 2016-05-06 DIAGNOSIS — E7800 Pure hypercholesterolemia, unspecified: Secondary | ICD-10-CM

## 2016-05-06 DIAGNOSIS — G9332 Myalgic encephalomyelitis/chronic fatigue syndrome: Secondary | ICD-10-CM

## 2016-05-06 DIAGNOSIS — E559 Vitamin D deficiency, unspecified: Secondary | ICD-10-CM

## 2016-05-06 DIAGNOSIS — R5382 Chronic fatigue, unspecified: Secondary | ICD-10-CM

## 2016-05-06 DIAGNOSIS — E039 Hypothyroidism, unspecified: Secondary | ICD-10-CM

## 2016-05-06 NOTE — Telephone Encounter (Signed)
-----   Message from Robert Bellow, LPN sent at 6/0/1093 10:06 PM EST ----- Regarding: Labs 3/14 Please place lab orders.

## 2016-05-07 ENCOUNTER — Ambulatory Visit: Payer: PRIVATE HEALTH INSURANCE

## 2016-05-07 ENCOUNTER — Other Ambulatory Visit: Payer: PRIVATE HEALTH INSURANCE

## 2016-05-09 ENCOUNTER — Ambulatory Visit (INDEPENDENT_AMBULATORY_CARE_PROVIDER_SITE_OTHER): Payer: Medicare Other

## 2016-05-09 VITALS — BP 130/78 | HR 94 | Temp 98.3°F | Ht 67.0 in | Wt 208.2 lb

## 2016-05-09 DIAGNOSIS — Z0000 Encounter for general adult medical examination without abnormal findings: Secondary | ICD-10-CM

## 2016-05-09 DIAGNOSIS — E559 Vitamin D deficiency, unspecified: Secondary | ICD-10-CM | POA: Diagnosis not present

## 2016-05-09 DIAGNOSIS — G9332 Myalgic encephalomyelitis/chronic fatigue syndrome: Secondary | ICD-10-CM

## 2016-05-09 DIAGNOSIS — R5382 Chronic fatigue, unspecified: Secondary | ICD-10-CM

## 2016-05-09 DIAGNOSIS — E7800 Pure hypercholesterolemia, unspecified: Secondary | ICD-10-CM | POA: Diagnosis not present

## 2016-05-09 DIAGNOSIS — E039 Hypothyroidism, unspecified: Secondary | ICD-10-CM | POA: Diagnosis not present

## 2016-05-09 LAB — TSH: TSH: 1.83 u[IU]/mL (ref 0.35–4.50)

## 2016-05-09 LAB — VITAMIN D 25 HYDROXY (VIT D DEFICIENCY, FRACTURES): VITD: 38.57 ng/mL (ref 30.00–100.00)

## 2016-05-09 LAB — LIPID PANEL
CHOL/HDL RATIO: 3
Cholesterol: 205 mg/dL — ABNORMAL HIGH (ref 0–200)
HDL: 60.6 mg/dL (ref >39.00)
LDL CALC: 114 mg/dL — AB (ref 0–99)
NonHDL: 144.46
Triglycerides: 153 mg/dL — ABNORMAL HIGH (ref 0.0–149.0)
VLDL: 30.6 mg/dL (ref 0.0–40.0)

## 2016-05-09 LAB — COMPREHENSIVE METABOLIC PANEL
ALBUMIN: 4.2 g/dL (ref 3.5–5.2)
ALT: 10 U/L (ref 0–35)
AST: 14 U/L (ref 0–37)
Alkaline Phosphatase: 70 U/L (ref 39–117)
BILIRUBIN TOTAL: 0.6 mg/dL (ref 0.2–1.2)
BUN: 27 mg/dL — AB (ref 6–23)
CHLORIDE: 100 meq/L (ref 96–112)
CO2: 30 meq/L (ref 19–32)
CREATININE: 1.11 mg/dL (ref 0.40–1.20)
Calcium: 10.5 mg/dL (ref 8.4–10.5)
GFR: 52.29 mL/min — ABNORMAL LOW (ref >60.00)
Glucose, Bld: 99 mg/dL (ref 70–99)
Potassium: 4.8 mEq/L (ref 3.5–5.1)
SODIUM: 139 meq/L (ref 135–145)
Total Protein: 7.6 g/dL (ref 6.0–8.3)

## 2016-05-09 LAB — CBC WITH DIFFERENTIAL/PLATELET
BASOS ABS: 0.1 10*3/uL (ref 0.0–0.1)
BASOS PCT: 0.7 % (ref 0.0–3.0)
EOS ABS: 0.3 10*3/uL (ref 0.0–0.7)
Eosinophils Relative: 4.1 % (ref 0.0–5.0)
HEMATOCRIT: 37.8 % (ref 36.0–46.0)
HEMOGLOBIN: 12.7 g/dL (ref 12.0–15.0)
LYMPHS PCT: 35.9 % (ref 12.0–46.0)
Lymphs Abs: 2.5 10*3/uL (ref 0.7–4.0)
MCHC: 33.6 g/dL (ref 30.0–36.0)
MCV: 93.9 fl (ref 78.0–100.0)
MONO ABS: 0.4 10*3/uL (ref 0.1–1.0)
Monocytes Relative: 5.3 % (ref 3.0–12.0)
Neutro Abs: 3.8 10*3/uL (ref 1.4–7.7)
Neutrophils Relative %: 54 % (ref 43.0–77.0)
PLATELETS: 272 10*3/uL (ref 150.0–400.0)
RBC: 4.03 Mil/uL (ref 3.87–5.11)
RDW: 15.1 % (ref 11.5–15.5)
WBC: 7.1 10*3/uL (ref 4.0–10.5)

## 2016-05-09 NOTE — Progress Notes (Signed)
Subjective:   Lori Orozco is a 66 y.o. female who presents for Medicare Annual (Subsequent) preventive examination.  Review of Systems:  N/A Cardiac Risk Factors include: advanced age (>62men, >52 women);obesity (BMI >30kg/m2);dyslipidemia     Objective:     Vitals: BP 130/78 (BP Location: Left Arm, Patient Position: Sitting, Cuff Size: Normal)   Pulse 94   Temp 98.3 F (36.8 C) (Oral)   Ht 5\' 7"  (1.702 m) Comment: shoes  Wt 208 lb 4 oz (94.5 kg)   SpO2 94%   BMI 32.62 kg/m   Body mass index is 32.62 kg/m.   Tobacco History  Smoking Status  . Former Smoker  . Packs/day: 2.00  . Years: 43.00  . Types: Cigarettes  . Quit date: 02/26/2013  Smokeless Tobacco  . Never Used    Comment: "vaping now"     Counseling given: No   Past Medical History:  Diagnosis Date  . Amaurosis fugax   . Anemia    hx  . Anxiety   . Arthritis   . Asthma   . Cataract   . Chronic fatigue syndrome   . Chronic kidney disease    frequency, nephritis when 66 yrs old  . COPD (chronic obstructive pulmonary disease) (HCC)   . Depression   . Diverticulitis   . Emphysema of lung (HCC)   . Fibromyalgia   . GERD (gastroesophageal reflux disease)    occ  . H/O hiatal hernia   . History of bronchitis   . Hyperlipidemia   . Hypothyroidism   . Interstitial cystitis   . Irritable bowel syndrome   . Lichen sclerosus   . Lumbar herniated disc   . Migraines   . Pneumonia   . PONV (postoperative nausea and vomiting)   . Shortness of breath    exertion   . Thyroid disease    [place]  . Urinary frequency   . Urinary tract infection   . Vertigo    Past Surgical History:  Procedure Laterality Date  . ABDOMINAL HYSTERECTOMY    . bladder sugery     . CAROTID DOPPLAR    . COLONOSCOPY  05/26/2010   avms- otherwise nl , re check 10y  . DEXA-OSTEOPENIA    . DOPPLER ECHOCARDIOGRAPHY    . EPICONDYLITIS    . EYE SURGERY Bilateral    cataracts  . FOOT SURGERY Bilateral   . KNEE  SURGERY     Left cartilage  . lipoma in second finger right hand    . PLANTAR FASCIA SURGERY Left   . TEMPOROMANDIBULAR JOINT SURGERY    . TONSILLECTOMY    . TOTAL HIP ARTHROPLASTY Right 03/20/2013   Procedure: Right TOTAL HIP ARTHROPLASTY;  Surgeon: [name], MD;  Location: [facility] OR;  Service: Orthopedics;  Laterality: Right;  Right Total Hip Arthroplasty  . TOTAL KNEE ARTHROPLASTY Left 01/28/2015   Procedure: LEFT TOTAL KNEE ARTHROPLASTY;  Surgeon: [name], MD;  Location: [facility];  Service: Orthopedics;  Laterality: Left;  . TRIGGER FINGER RELEASE Right   . TROCHANTERIC BURSA EXCISION Right 05/03/2016   Dr. [name], [name]  . WRIST ARTHROSCOPY Right    ligament tear   Family History  Problem Relation Age of Onset  . Thyroid disease Mother   . Hypertension Mother   . Kidney disease Mother   . Cancer Father   . Colon cancer Other    History  Sexual Activity  . Sexual activity: Not on file    Outpatient  Encounter Prescriptions as of 05/09/2016  Medication Sig  . aspirin 81 MG chewable tablet Chew 81 mg by mouth at bedtime.   . baclofen (LIORESAL) 10 MG tablet Take 1 tablet (10 mg total) by mouth every 8 (eight) hours as needed for muscle spasms (Pain).  Marland Kitchen buPROPion (WELLBUTRIN XL) 150 MG 24 hr tablet Take 150 mg by mouth daily.    . busPIRone (BUSPAR) 15 MG tablet Take 15 mg by mouth 2 (two) times daily before a meal.   . Cholecalciferol (VITAMIN D3) 2000 UNITS TABS Take 2,000 Units by mouth daily.  . clobetasol cream (TEMOVATE) 0.05 % APPLY SMALL AMOUNT TO AFFECTED AREAS 2 TO 3 TIMES PER WEEK  . cyanocobalamin 2000 MCG tablet Take 2,000 mcg by mouth daily.  . divalproex (DEPAKOTE ER) 250 MG 24 hr tablet   . fluticasone (FLONASE) 50 MCG/ACT nasal spray PLACE 2 SPRAYS INTO THE NOSE DAILY.  Marland Kitchen HYDROcodone-acetaminophen (NORCO/VICODIN) 5-325 MG tablet Take 1 tablet by mouth every 6 (six) hours as needed for moderate pain. Reported on 07/27/2015  . levothyroxine  (SYNTHROID, LEVOTHROID) 50 MCG tablet TAKE 1 TABLET (50 MCG TOTAL) BY MOUTH DAILY.  Marland Kitchen LORazepam (ATIVAN) 1 MG tablet Take 1 mg by mouth 2 (two) times daily as needed for anxiety.  . methocarbamol (ROBAXIN) 750 MG tablet Take 1 tablet (750 mg total) by mouth every 8 (eight) hours as needed for muscle spasms.  . mirabegron ER (MYRBETRIQ) 25 MG TB24 tablet Take 1 tablet (25 mg total) by mouth daily.  . mirtazapine (REMERON) 30 MG tablet Take 30 mg by mouth at bedtime.   . nabumetone (RELAFEN) 750 MG tablet Take 1 tablet (750 mg total) by mouth 2 (two) times daily.  Marland Kitchen oxyCODONE (OXY IR/ROXICODONE) 5 MG immediate release tablet Take 1-2 tablets (5-10 mg total) by mouth every 4 (four) hours as needed for breakthrough pain.  Marland Kitchen PREMARIN 0.625 MG tablet Take 1 tablet (0.625 mg total) by mouth daily.  Marland Kitchen PROAIR HFA 108 (90 Base) MCG/ACT inhaler INHALE 2 PUFFS INTO THE LUNGS EVERY 4 (FOUR) HOURS AS NEEDED FOR WHEEZING.  . sertraline (ZOLOFT) 100 MG tablet Take 100 mg by mouth daily.   . simvastatin (ZOCOR) 20 MG tablet TAKE 1 TABLET (20 MG TOTAL) BY MOUTH AT BEDTIME.  . [DISCONTINUED] divalproex (DEPAKOTE ER) 500 MG 24 hr tablet Take 500 mg by mouth every evening.   . [DISCONTINUED] doxycycline (VIBRA-TABS) 100 MG tablet Take 2 today, then 1 daily until gone.  . [DISCONTINUED] methocarbamol (ROBAXIN) 500 MG tablet Take 1 tablet (500 mg total) by mouth every 6 (six) hours as needed for muscle spasms.   No facility-administered encounter medications on file as of 05/09/2016.     Activities of Daily Living In your present state of health, do you have any difficulty performing the following activities: 05/09/2016  Hearing? Y  Vision? N  Difficulty concentrating or making decisions? N  Walking or climbing stairs? Y  Dressing or bathing? N  Doing errands, shopping? N  Preparing Food and eating ? N  Using the Toilet? N  In the past six months, have you accidently leaked urine? Y  Do you have problems with  loss of bowel control? N  Managing your Medications? N  Managing your Finances? N  Housekeeping or managing your Housekeeping? N  Some recent data might be hidden    Patient Care Team: [name], MD as PCP - General Mckinley Jewel, MD as Consulting Physician (Ophthalmology) Phillip Heal, MD as  Referring Physician (Psychiatry) Allie Bossier, MD as Consulting Physician (Obstetrics and Gynecology) Waymon Budge, MD as Consulting Physician (Pulmonary Disease) [name], MD as Consulting Physician (Orthopedic Surgery) [name], MD as Consulting Physician (Orthopedic Surgery) Tyrell Antonio, MD as Consulting Physician (Physical Medicine and Rehabilitation) Ronalee Belts, MD as Referring Physician (Otolaryngology) Doran Heater, PhD as Referring Physician (Psychology)    Assessment:     Hearing Screening   125Hz  250Hz  500Hz  1000Hz  2000Hz  3000Hz  4000Hz  6000Hz  8000Hz   Right ear:   0 40 40  0    Left ear:   0 0 40  0    Vision Screening Comments: Last vision exam in Nov 2017    Exercise Activities and Dietary recommendations Current Exercise Habits: The patient does not participate in regular exercise at present, Exercise limited by: Other - see comments (chronic fatigue syndrome and pain)  Goals    . resume walking without pain          Staring 05/09/16, I will continue to follow doctor instructions in an effort to recover and be able to resume walking without pain.       Fall Risk Fall Risk  05/09/2016 04/26/2015 04/23/2014 11/18/2012  Falls in the past year? Yes No No Yes  Number falls in past yr: 1 - - 2 or more  Injury with Fall? Yes - - -  Risk Factor Category  - - - High Fall Risk   Depression Screen PHQ 2/9 Scores 05/09/2016 04/26/2015 04/23/2014 11/18/2012  PHQ - 2 Score 6 0 1 2  PHQ- 9 Score 21 - - -     Cognitive Function MMSE - Mini Mental State Exam 05/09/2016  Orientation to time 5  Orientation to Place 5  Registration 3  Attention/  Calculation 0  Recall 3  Language- name 2 objects 0  Language- repeat 1  Language- follow 3 step command 3  Language- read & follow direction 0  Write a sentence 0  Copy design 0  Total score 20       PLEASE NOTE: A Mini-Cog screen was completed. Maximum score is 20. A value of 0 denotes this part of Folstein MMSE was not completed or the patient failed this part of the Mini-Cog screening.   Mini-Cog Screening Orientation to Time - Max 5 pts Orientation to Place - Max 5 pts Registration - Max 3 pts Recall - Max 3 pts Language Repeat - Max 1 pts Language Follow 3 Step Command - Max 3 pts   Immunization History  Administered Date(s) Administered  . Influenza Split 11/19/2011, 11/02/2013  . Influenza Whole 12/20/2006, 11/25/2008, 11/26/2009, 11/24/2010  . Influenza,inj,Quad PF,36+ Mos 12/06/2015  . Influenza-Unspecified 12/08/2012, 12/03/2014  . Pneumococcal Conjugate-13 12/06/2015  . Pneumococcal Polysaccharide-23 01/26/2002, 03/23/2008, 09/07/2010  . Td 06/26/2000, 10/19/2011   Screening Tests Health Maintenance  Topic Date Due  . PAP SMEAR  03/30/2020 (Originally 06/19/1971)  . HIV Screening  03/30/2020 (Originally 06/18/1965)  . MAMMOGRAM  06/07/2016  . PNA vac Low Risk Adult (2 of 2 - PPSV23) 12/05/2016  . COLONOSCOPY  05/25/2020  . TETANUS/TDAP  10/18/2021  . INFLUENZA VACCINE  Completed  . DEXA SCAN  Completed  . Hepatitis C Screening  Completed      Plan:   I have personally reviewed and addressed the Medicare Annual Wellness questionnaire and have noted the following in the patient's chart:  A. Medical and social history B. Use of alcohol, tobacco or illicit drugs  C. Current medications  and supplements D. Functional ability and status E.  Nutritional status F.  Physical activity G. Advance directives H. List of other physicians I.  Hospitalizations, surgeries, and ER visits in previous 12 months J.  Vitals K. Screenings to include hearing, vision,  cognitive, depression L. Referrals and appointments - none  In addition, I have reviewed and discussed with patient certain preventive protocols, quality metrics, and best practice recommendations. A written personalized care plan for preventive services as well as general preventive health recommendations were provided to patient.  See attached scanned questionnaire for additional information.   Signed,   Randa Evens, MHA, BS, LPN Health Coach

## 2016-05-09 NOTE — Progress Notes (Signed)
Pre visit review using our clinic review tool, if applicable. No additional management support is needed unless otherwise documented below in the visit note. 

## 2016-05-09 NOTE — Patient Instructions (Signed)
Lori Orozco , Thank you for taking time to come for your Medicare Wellness Visit. I appreciate your ongoing commitment to your health goals. Please review the following plan we discussed and let me know if I can assist you in the future.   These are the goals we discussed: Goals    . resume walking without pain          Staring 05/09/16, I will continue to follow doctor instructions in an effort to recover and be able to resume walking without pain.        This is a list of the screening recommended for you and due dates:  Health Maintenance  Topic Date Due  . Pap Smear  03/30/2020*  . HIV Screening  03/30/2020*  . Mammogram  06/07/2016  . Pneumonia vaccines (2 of 2 - PPSV23) 12/05/2016  . Colon Cancer Screening  05/25/2020  . Tetanus Vaccine  10/18/2021  . Flu Shot  Completed  . DEXA scan (bone density measurement)  Completed  .  Hepatitis C: One time screening is recommended by Center for Disease Control  (CDC) for  adults born from [birth year] through 1965.   Completed  *Topic was postponed. The date shown is not the original due date.   Preventive Care for Adults  A healthy lifestyle and preventive care can promote health and wellness. Preventive health guidelines for adults include the following key practices.  . A routine yearly physical is a good way to check with your health care provider about your health and preventive screening. It is a chance to share any concerns and updates on your health and to receive a thorough exam.  . Visit your dentist for a routine exam and preventive care every 6 months. Brush your teeth twice a day and floss once a day. Good oral hygiene prevents tooth decay and gum disease.  . The frequency of eye exams is based on your age, health, family medical history, use  of contact lenses, and other factors. Follow your health care provider's ecommendations for frequency of eye exams.  . Eat a healthy diet. Foods like vegetables, fruits, whole grains,  low-fat dairy products, and lean protein foods contain the nutrients you need without too many calories. Decrease your intake of foods high in solid fats, added sugars, and salt. Eat the right amount of calories for you. Get information about a proper diet from your health care provider, if necessary.  . Regular physical exercise is one of the most important things you can do for your health. Most adults should get at least 150 minutes of moderate-intensity exercise (any activity that increases your heart rate and causes you to sweat) each week. In addition, most adults need muscle-strengthening exercises on 2 or more days a week.  Silver Sneakers may be a benefit available to you. To determine eligibility, you may visit the website: www.silversneakers.com or contact program at 231-856-6871 Mon-Fri between 8AM-8PM.   . Maintain a healthy weight. The body mass index (BMI) is a screening tool to identify possible weight problems. It provides an estimate of body fat based on height and weight. Your health care provider can find your BMI and can help you achieve or maintain a healthy weight.   For adults 20 years and older: ? A BMI below 18.5 is considered underweight. ? A BMI of 18.5 to 24.9 is normal. ? A BMI of 25 to 29.9 is considered overweight. ? A BMI of 30 and above is considered obese.   Marland Kitchen  Maintain normal blood lipids and cholesterol levels by exercising and minimizing your intake of saturated fat. Eat a balanced diet with plenty of fruit and vegetables. Blood tests for lipids and cholesterol should begin at age 43 and be repeated every 5 years. If your lipid or cholesterol levels are high, you are over 50, or you are at high risk for heart disease, you may need your cholesterol levels checked more frequently. Ongoing high lipid and cholesterol levels should be treated with medicines if diet and exercise are not working.  . If you smoke, find out from your health care provider how to quit. If  you do not use tobacco, please do not start.  . If you choose to drink alcohol, please do not consume more than 2 drinks per day. One drink is considered to be 12 ounces (355 mL) of beer, 5 ounces (148 mL) of wine, or 1.5 ounces (44 mL) of liquor.  . If you are 34-28 years old, ask your health care provider if you should take aspirin to prevent strokes.  . Use sunscreen. Apply sunscreen liberally and repeatedly throughout the day. You should seek shade when your shadow is shorter than you. Protect yourself by wearing long sleeves, pants, a wide-brimmed hat, and sunglasses year round, whenever you are outdoors.  . Once a month, do a whole body skin exam, using a mirror to look at the skin on your back. Tell your health care provider of new moles, moles that have irregular borders, moles that are larger than a pencil eraser, or moles that have changed in shape or color.

## 2016-05-09 NOTE — Progress Notes (Signed)
PCP notes:   Health maintenance:  No gaps identified.   Abnormal screenings:   Hearing - failed Fall risk - hx of fall with injury Depression score: 21  Patient concerns:   Patient is recovering from surgery on 05/03/16 to right trochanter. Patient is ambulating with cane. Pain level: 10/10.   Nurse concerns:  None  Next PCP appt:   05/16/16 @ 1130  I reviewed health advisor's note, was available for consultation, and agree with documentation and plan. Roxy Manns MD

## 2016-05-11 ENCOUNTER — Encounter: Payer: PRIVATE HEALTH INSURANCE | Admitting: Family Medicine

## 2016-05-12 ENCOUNTER — Other Ambulatory Visit: Payer: Self-pay | Admitting: Family Medicine

## 2016-05-16 ENCOUNTER — Encounter: Payer: Medicare Other | Admitting: Family Medicine

## 2016-05-17 ENCOUNTER — Ambulatory Visit (INDEPENDENT_AMBULATORY_CARE_PROVIDER_SITE_OTHER): Payer: Medicare Other | Admitting: Orthopaedic Surgery

## 2016-05-17 DIAGNOSIS — M7061 Trochanteric bursitis, right hip: Secondary | ICD-10-CM

## 2016-05-17 NOTE — Progress Notes (Signed)
The patient is 2 weeks status post a right hip trochanteric bursectomy. She's having considerable amount of pain but overall doing well. She is trying to refrain from sleeping on her right hip.  On examination incisions well-healed. She has pain of the trochanteric area and IT band. Is no evidence infection.  At this point she'll continue slowly increase her activities but no contact sports including no bowling or sleeping on her right hip until further notice. I'll see her back in a month to see how she is doing overall. She'll try anti-inflammatories as well as alternating ice and heat.

## 2016-05-28 ENCOUNTER — Other Ambulatory Visit: Payer: Self-pay | Admitting: Family Medicine

## 2016-05-28 DIAGNOSIS — Z1231 Encounter for screening mammogram for malignant neoplasm of breast: Secondary | ICD-10-CM

## 2016-06-08 ENCOUNTER — Ambulatory Visit (INDEPENDENT_AMBULATORY_CARE_PROVIDER_SITE_OTHER): Payer: Medicare Other

## 2016-06-08 DIAGNOSIS — Z1231 Encounter for screening mammogram for malignant neoplasm of breast: Secondary | ICD-10-CM | POA: Diagnosis not present

## 2016-06-09 ENCOUNTER — Other Ambulatory Visit: Payer: Self-pay | Admitting: Family Medicine

## 2016-06-20 ENCOUNTER — Ambulatory Visit (INDEPENDENT_AMBULATORY_CARE_PROVIDER_SITE_OTHER): Payer: Medicare Other | Admitting: Orthopaedic Surgery

## 2016-06-20 DIAGNOSIS — M7061 Trochanteric bursitis, right hip: Secondary | ICD-10-CM

## 2016-06-20 NOTE — Progress Notes (Signed)
Ms. 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 She says she is slowly feeling better and does feel that there is been improvement since surgery.  Her incisions well-healed right side. She still has some pain over the mid iliotibial band but not as severe as is been.  At this point she'll continue stretching exercises and slow return to full activities. I'll let her get back to her bowling in May. At this point she'll follow-up as needed. All questions were encouraged and answered.

## 2016-06-22 ENCOUNTER — Other Ambulatory Visit: Payer: Self-pay | Admitting: Family Medicine

## 2016-06-26 ENCOUNTER — Ambulatory Visit (INDEPENDENT_AMBULATORY_CARE_PROVIDER_SITE_OTHER): Payer: Medicare Other | Admitting: Family Medicine

## 2016-06-26 ENCOUNTER — Encounter: Payer: Self-pay | Admitting: Family Medicine

## 2016-06-26 VITALS — BP 140/86 | HR 91 | Temp 98.2°F | Ht 67.0 in | Wt 211.0 lb

## 2016-06-26 DIAGNOSIS — M8580 Other specified disorders of bone density and structure, unspecified site: Secondary | ICD-10-CM

## 2016-06-26 DIAGNOSIS — E7800 Pure hypercholesterolemia, unspecified: Secondary | ICD-10-CM

## 2016-06-26 DIAGNOSIS — E6609 Other obesity due to excess calories: Secondary | ICD-10-CM

## 2016-06-26 DIAGNOSIS — E039 Hypothyroidism, unspecified: Secondary | ICD-10-CM

## 2016-06-26 DIAGNOSIS — E559 Vitamin D deficiency, unspecified: Secondary | ICD-10-CM | POA: Diagnosis not present

## 2016-06-26 DIAGNOSIS — J449 Chronic obstructive pulmonary disease, unspecified: Secondary | ICD-10-CM

## 2016-06-26 DIAGNOSIS — N904 Leukoplakia of vulva: Secondary | ICD-10-CM

## 2016-06-26 DIAGNOSIS — Z6833 Body mass index (BMI) 33.0-33.9, adult: Secondary | ICD-10-CM

## 2016-06-26 DIAGNOSIS — M7061 Trochanteric bursitis, right hip: Secondary | ICD-10-CM

## 2016-06-26 DIAGNOSIS — R030 Elevated blood-pressure reading, without diagnosis of hypertension: Secondary | ICD-10-CM

## 2016-06-26 DIAGNOSIS — F319 Bipolar disorder, unspecified: Secondary | ICD-10-CM

## 2016-06-26 MED ORDER — SIMVASTATIN 20 MG PO TABS: 20.0000 mg | ORAL_TABLET | Freq: Every day | ORAL | 11 refills | Status: DC

## 2016-06-26 MED ORDER — SCOPOLAMINE 1 MG/3DAYS TD PT72: 1.0000 | MEDICATED_PATCH | TRANSDERMAL | 0 refills | Status: DC

## 2016-06-26 MED ORDER — MIRABEGRON ER 25 MG PO TB24: 25.0000 mg | ORAL_TABLET | Freq: Every day | ORAL | 11 refills | Status: DC

## 2016-06-26 NOTE — Patient Instructions (Addendum)
Shingrix is the new shingles vaccine  If you are interested in a shingles/zoster vaccine - call your insurance to check on coverage,( you should not get it within 1 month of other vaccines) , then call us for a prescription  for it to take to a pharmacy that gives the shot , or make a nurse visit to get it here depending on your coverage    For cholesterol : Avoid red meat/ fried foods/ egg yolks/ fatty breakfast meats/ butter, cheese and high fat dairy/ and shellfish   Try to stop season with bacon drippings   For kidney health - aim for total of 64 oz day

## 2016-06-26 NOTE — Progress Notes (Signed)
Pre visit review using our clinic review tool, if applicable. No additional management support is needed unless otherwise documented below in the visit note. 

## 2016-06-26 NOTE — Progress Notes (Signed)
Subjective:    Patient ID: Lori Orozco, female    DOB: [birth year], 66 y.o.   MRN: [id number]  HPI Here for annual f/u of chronic health problems   Had AMW 3/14 Missed several tones on hearing exam She had a hearing test last year -clinic/sees ENT and they keep an eye on it  Unsure if she needs ears cleaned out  Cannot afford hearing aides  Had a fall this year Depression score 21  Had trochanter surgery for bursitis- pain level is going down/ still sore (tendon release)   Wt Readings from Last 3 Encounters:  06/26/16 211 lb (95.7 kg)  05/09/16 208 lb 4 oz (94.5 kg)  04/10/16 200 lb (90.7 kg)  diet- she says "I need an appetite suppressant" - eats several hours after she eats dinner  bmi 33.0 Cannot exercise yet  Also issues with chronic pain/fatigue   Mammogram 4/18 neg Self breast exam -no lumps or changes   Colonoscopy 3/12-routine risk recall  dexa 9/13-normal range D level 38-takes 2000 iu daily   Zoster vaccine - interested in shingrix if affordable   Sees gyn - for lichen sclerosis/ doing great!  Has had a hysterectomy   Hypothyroidism  Pt has no clinical changes No change in energy level/ hair or skin/ edema and no tremor Lab Results  Component Value Date   TSH 1.83 05/09/2016      BP Readings from Last 3 Encounters:  06/26/16 140/86  05/09/16 130/78  09/02/15 138/70     Hx of copd-well controlled  Still vapes occasionally - nicotine free 3 years !  Still a little sob when exerting herself   Hx of hyperlipidemia Lab Results  Component Value Date   CHOL 205 (H) 05/09/2016   CHOL 199 04/26/2015   CHOL 182 04/23/2014   Lab Results  Component Value Date   HDL 60.60 05/09/2016   HDL 64.00 04/26/2015   HDL 68.70 04/23/2014   Lab Results  Component Value Date   LDLCALC 114 (H) 05/09/2016   LDLCALC 108 (H) 04/26/2015   LDLCALC 97 04/23/2014   Lab Results  Component Value Date   TRIG 153.0 (H) 05/09/2016   TRIG 132.0 04/26/2015     TRIG 84.0 04/23/2014   Lab Results  Component Value Date   CHOLHDL 3 05/09/2016   CHOLHDL 3 04/26/2015   CHOLHDL 3 04/23/2014   No results found for: LDLDIRECT  Does watch fat in diet  No fast food  occ bacon  She does season with bacon drippings   Results for orders placed or performed in visit on 05/09/16  CBC with Differential/Platelet  Result Value Ref Range   WBC 7.1 4.0 - 10.5 K/uL   RBC 4.03 3.87 - 5.11 Mil/uL   Hemoglobin 12.7 12.0 - 15.0 g/dL   HCT 76.5 46.5 - 03.5 %   MCV 93.9 78.0 - 100.0 fl   MCHC 33.6 30.0 - 36.0 g/dL   RDW 46.5 68.1 - 27.5 %   Platelets 272.0 150.0 - 400.0 K/uL   Neutrophils Relative % 54.0 43.0 - 77.0 %   Lymphocytes Relative 35.9 12.0 - 46.0 %   Monocytes Relative 5.3 3.0 - 12.0 %   Eosinophils Relative 4.1 0.0 - 5.0 %   Basophils Relative 0.7 0.0 - 3.0 %   Neutro Abs 3.8 1.4 - 7.7 K/uL   Lymphs Abs 2.5 0.7 - 4.0 K/uL   Monocytes Absolute 0.4 0.1 - 1.0 K/uL   Eosinophils Absolute 0.3 0.0 -  0.7 K/uL   Basophils Absolute 0.1 0.0 - 0.1 K/uL  Comprehensive metabolic panel  Result Value Ref Range   Sodium 139 135 - 145 mEq/L   Potassium 4.8 3.5 - 5.1 mEq/L   Chloride 100 96 - 112 mEq/L   CO2 30 19 - 32 mEq/L   Glucose, Bld 99 70 - 99 mg/dL   BUN 27 (H) 6 - 23 mg/dL   Creatinine, Ser 1.61 0.40 - 1.20 mg/dL   Total Bilirubin 0.6 0.2 - 1.2 mg/dL   Alkaline Phosphatase 70 39 - 117 U/L   AST 14 0 - 37 U/L   ALT 10 0 - 35 U/L   Total Protein 7.6 6.0 - 8.3 g/dL   Albumin 4.2 3.5 - 5.2 g/dL   Calcium 09.6 8.4 - 04.5 mg/dL   GFR 40.98 (L) >11.91 mL/min  Lipid panel  Result Value Ref Range   Cholesterol 205 (H) 0 - 200 mg/dL   Triglycerides 478.2 (H) 0.0 - 149.0 mg/dL   HDL 95.62 >13.08 mg/dL   VLDL 65.7 0.0 - 84.6 mg/dL   LDL Cholesterol 962 (H) 0 - 99 mg/dL   Total CHOL/HDL Ratio 3    NonHDL 144.46   TSH  Result Value Ref Range   TSH 1.83 0.35 - 4.50 uIU/mL  VITAMIN D 25 Hydroxy (Vit-D Deficiency, Fractures)  Result Value Ref  Range   VITD 38.57 30.00 - 100.00 ng/mL    BUN is slt high  She tries to drink more water    Patient Active Problem List   Diagnosis Date Noted  . Trochanteric bursitis, right hip 04/10/2016  . Elevated BP without diagnosis of hypertension 09/02/2015  . Urge incontinence 07/27/2015  . Obesity 04/28/2015  . Need for hepatitis C screening test 04/26/2015  . Screening for HIV (human immunodeficiency virus) 04/26/2015  . Osteoarthritis of left knee 01/28/2015  . Status post total left knee replacement 01/28/2015  . Vitamin D deficiency 04/27/2014  . Seasonal and perennial allergic rhinitis 06/17/2013  . S/P total hip arthroplasty 03/20/2013  . 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 Encounter for Medicare annual wellness exam 11/18/2012  . History of falling 11/18/2012  . Routine general medical examination at a health care facility 11/10/2012  . 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 Osteopenia 10/19/2011  . Other screening mammogram 10/19/2011  . Hypothyroid 10/19/2011  . ANEMIA 10/11/2009  . PERIPHERAL NEUROPATHY, FEET 08/05/2009  . BACK PAIN 08/05/2009  . HAND PAIN, BILATERAL 08/05/2009  . TREMOR 03/09/2008  . MENOPAUSAL SYNDROME 10/09/2007  . DEPRESSION, MAJOR 05/20/2007  . BIPOLAR AFFECTIVE DISORDER 05/20/2007  . ANXIETY 05/20/2007  . 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 ESOPHAGEAL SPASM 05/20/2007  . HIATAL HERNIA 05/20/2007  . AMAUROSIS FUGAX 05/07/2007  . COPD mixed type (HCC) 03/27/2007  . HYPERCHOLESTEROLEMIA, PURE 12/10/2006  . SYMPTOM, SYNDROME, CHRONIC FATIGUE 12/10/2006   Past Medical History:  Diagnosis Date  . Amaurosis fugax   . Anemia    hx  . Anxiety   . Arthritis   . Asthma   . Cataract   . Chronic fatigue syndrome   . Chronic kidney disease    frequency, nephritis when 66 yrs old  . COPD (chronic obstructive pulmonary disease) (HCC)   . Depression   . Diverticulitis   . Emphysema of lung (HCC)   .  Fibromyalgia   . GERD (gastroesophageal reflux disease)    occ  . H/O hiatal hernia   . History of  bronchitis   . Hyperlipidemia   . Hypothyroidism   . Interstitial cystitis   . Irritable bowel syndrome   . Lichen sclerosus   . Lumbar herniated disc   . Migraines   . Pneumonia   . PONV (postoperative nausea and vomiting)   . Shortness of breath    exertion   . Thyroid disease    [place]  . Urinary frequency   . Urinary tract infection   . Vertigo    Past Surgical History:  Procedure Laterality Date  . ABDOMINAL HYSTERECTOMY    . bladder sugery     . CAROTID DOPPLAR    . COLONOSCOPY  05/26/2010   avms- otherwise nl , re check 10y  . DEXA-OSTEOPENIA    . DOPPLER ECHOCARDIOGRAPHY    . EPICONDYLITIS    . EYE SURGERY Bilateral    cataracts  . FOOT SURGERY Bilateral   . KNEE SURGERY     Left cartilage  . lipoma in second finger right hand    . PLANTAR FASCIA SURGERY Left   . TEMPOROMANDIBULAR JOINT SURGERY    . TONSILLECTOMY    . TOTAL HIP ARTHROPLASTY Right 03/20/2013   Procedure: Right TOTAL HIP ARTHROPLASTY;  Surgeon: [name], MD;  Location: [facility] OR;  Service: Orthopedics;  Laterality: Right;  Right Total Hip Arthroplasty  . TOTAL KNEE ARTHROPLASTY Left 01/28/2015   Procedure: LEFT TOTAL KNEE ARTHROPLASTY;  Surgeon: [name], MD;  Location: [facility];  Service: Orthopedics;  Laterality: Left;  . TRIGGER FINGER RELEASE Right   . TROCHANTERIC BURSA EXCISION Right 05/03/2016   Dr. [name], [name]  . WRIST ARTHROSCOPY Right    ligament tear   Social History  Substance Use Topics  . Smoking status: Former Smoker    Packs/day: 2.00    Years: 43.00    Types: Cigarettes    Quit date: 02/26/2013  . Smokeless tobacco: Never Used     Comment: "vaping now"  . Alcohol use No   Family History  Problem Relation Age of Onset  . Thyroid disease Mother   . Hypertension Mother   . Kidney disease Mother   . Cancer Father   . Colon cancer Other     Allergies  Allergen Reactions  . Lithium Anaphylaxis  . Tegretol [Carbamazepine] Other (See Comments)    Fever and body aches (fever over 103)  . Tricyclic Antidepressants Anaphylaxis  . Codeine Nausea Only    Makes pt stay awake  . Cymbalta [Duloxetine Hcl] Other (See Comments)    Makes pt pass out   . Erythromycin     REACTION: abdominal pain  . Lyrica [Pregabalin]     Felt faint  . Neurontin [Gabapentin]     Passes  out  . Rabeprazole Sodium     REACTION: insomnia  . Duraprep [employer], Misc.] Itching and Rash    Tolerates Betadine   . Penicillins Rash    Has patient had a PCN reaction causing immediate rash, facial/tongue/throat swelling, SOB or lightheadedness with hypotension: Yes Has patient had a PCN reaction causing severe rash involving mucus membranes or skin necrosis: No Has patient had a PCN reaction that required hospitalization No Has patient had a PCN reaction occurring within the last 10 years: No If all of the above answers are "NO", then may proceed with Cephalosporin use.   . Tape Rash    PT ALLERGIC NYLON TAPE    Current Outpatient Prescriptions on File Prior to Visit  Medication Sig Dispense Refill  .  aspirin 81 MG chewable tablet Chew 81 mg by mouth at bedtime.     . baclofen (LIORESAL) 10 MG tablet Take 1 tablet (10 mg total) by mouth every 8 (eight) hours as needed for muscle spasms (Pain). 60 tablet 0  . buPROPion (WELLBUTRIN XL) 150 MG 24 hr tablet Take 150 mg by mouth daily.      . busPIRone (BUSPAR) 15 MG tablet Take 15 mg by mouth 2 (two) times daily before a meal.     . Cholecalciferol (VITAMIN D3) 2000 UNITS TABS Take 2,000 Units by mouth daily.    . clobetasol cream (TEMOVATE) 0.05 % APPLY SMALL AMOUNT TO AFFECTED AREAS 2 TO 3 TIMES PER WEEK 15 g 3  . cyanocobalamin 2000 MCG tablet Take 2,000 mcg by mouth daily.    . divalproex (DEPAKOTE ER) 250 MG 24 hr tablet     . fluticasone (FLONASE) 50 MCG/ACT nasal spray PLACE 2 SPRAYS  INTO THE NOSE DAILY. 16 g 5  . HYDROcodone-acetaminophen (NORCO/VICODIN) 5-325 MG tablet Take 1 tablet by mouth every 6 (six) hours as needed for moderate pain. Reported on 07/27/2015    . levothyroxine (SYNTHROID, LEVOTHROID) 50 MCG tablet TAKE 1 TABLET (50 MCG TOTAL) BY MOUTH DAILY. 30 tablet 11  . LORazepam (ATIVAN) 1 MG tablet Take 1 mg by mouth 2 (two) times daily as needed for anxiety.    . mirtazapine (REMERON) 30 MG tablet Take 30 mg by mouth at bedtime.     Marland Kitchen oxyCODONE (OXY IR/ROXICODONE) 5 MG immediate release tablet Take 1-2 tablets (5-10 mg total) by mouth every 4 (four) hours as needed for breakthrough pain. 60 tablet 0  . PREMARIN 0.625 MG tablet Take 1 tablet (0.625 mg total) by mouth daily. 30 tablet 12  . PROAIR HFA 108 (90 Base) MCG/ACT inhaler INHALE 2 PUFFS INTO THE LUNGS EVERY 4 (FOUR) HOURS AS NEEDED FOR WHEEZING. 8.5 g 0  . sertraline (ZOLOFT) 100 MG tablet Take 100 mg by mouth daily.      No current facility-administered medications on file prior to visit.     Review of Systems Review of Systems  Constitutional: Negative for fever, appetite change,  and unexpected weight change. pos for chronic fatigue  Eyes: Negative for pain and visual disturbance.  Respiratory: Negative for cough and improved shortness of breath.   Cardiovascular: Negative for cp or palpitations    Gastrointestinal: Negative for nausea, diarrhea and constipation.  Genitourinary: Negative for urgency and frequency.  Skin: Negative for pallor or rash   Neurological: Negative for weakness, light-headedness, numbness and headaches.  Hematological: Negative for adenopathy. Does not bruise/bleed easily.  Psychiatric/Behavioral: Negative for dysphoric mood. The patient is sometimes nervous/anxious.         Objective:   Physical Exam  Constitutional: She appears well-developed and well-nourished. No distress.  obese and well appearing   HENT:  Head: Normocephalic and atraumatic.  Right Ear: External  ear normal.  Left Ear: External ear normal.  Mouth/Throat: Oropharynx is clear and moist.  Eyes: Conjunctivae and EOM are normal. Pupils are equal, round, and reactive to light. No scleral icterus.  Neck: Normal range of motion. Neck supple. No JVD present. Carotid bruit is not present. No thyromegaly present.  Cardiovascular: Normal rate, regular rhythm, normal heart sounds and intact distal pulses.  Exam reveals no gallop.   Pulmonary/Chest: Effort normal and breath sounds normal. No respiratory distress. She has no wheezes. She exhibits no tenderness.  Diffusely distant bs  Good air exch  No wheeze  Abdominal: Soft. Bowel sounds are normal. She exhibits no distension, no abdominal bruit and no mass. There is no tenderness.  Genitourinary: No breast swelling, tenderness, discharge or bleeding.  Genitourinary Comments: Breast exam: No mass, nodules, thickening, tenderness, bulging, retraction, inflamation, nipple discharge or skin changes noted.  No axillary or clavicular LA.      Musculoskeletal: Normal range of motion. She exhibits no edema or tenderness.  No kyphosis   Lymphadenopathy:    She has no cervical adenopathy.  Neurological: She is alert. She has normal reflexes. No cranial nerve deficit. She exhibits normal muscle tone. Coordination normal.  Skin: Skin is warm and dry. No rash noted. No erythema. No pallor.  Lentigines diffusely  Psychiatric: She has a normal mood and affect.  Bright affect today          Assessment & Plan:   Problem List Items Addressed This Visit      Respiratory   COPD mixed type (HCC) - Primary    Pt continues to abstain from smoking (only vapes non nicotine) Breathing continues to improve        Endocrine   Hypothyroid    Hypothyroidism  Pt has no clinical changes No change in energy level/ hair or skin/ edema and no tremor Lab Results  Component Value Date   TSH 1.83 05/09/2016            Musculoskeletal and Integument   Lichen  sclerosus et atrophicus of the vulva    Continues gyn f/u now yearly      Osteopenia    Last dexa was in the normal range  She takes 2000 iu vit D Level is 38  Disc need for calcium/ vitamin D/ wt bearing exercise and bone density test every 2 y to monitor Disc safety/ fracture risk in detail   No longer smoking      Trochanteric bursitis, right hip    Gradually improving after surgery         Other   BIPOLAR AFFECTIVE DISORDER    Pt continues to see psychiatry Overall stable on current medicines They make it difficult to loose weight      Elevated BP without diagnosis of hypertension    Some white coat component  Improved on 2nd check      HYPERCHOLESTEROLEMIA, PURE    Disc goals for lipids and reasons to control them Rev labs with pt Rev low sat fat diet in detail  On simvastatin and diet  LDL up from 108 to 114 Do not want this to go higher  Diet counseling done/handout given      Relevant Medications   simvastatin (ZOCOR) 20 MG tablet   Obesity    Discussed how this problem influences overall health and the risks it imposes  Reviewed plan for weight loss with lower calorie diet (via better food choices and also portion control or program like weight watchers) and exercise building up to or more than 30 minutes 5 days per week including some aerobic activity   Exercise is challenging-suggested chair exercise program      Vitamin D deficiency    Vitamin D level is therapeutic with current supplementation Disc importance of this to bone and overall health Level of 38  Takes 2000 iu daily

## 2016-06-27 NOTE — Assessment & Plan Note (Signed)
Pt continues to abstain from smoking (only vapes non nicotine) Breathing continues to improve

## 2016-06-27 NOTE — Assessment & Plan Note (Signed)
Gradually improving after surgery

## 2016-06-27 NOTE — Assessment & Plan Note (Signed)
Pt continues to see psychiatry Overall stable on current medicines They make it difficult to loose weight

## 2016-06-27 NOTE — Assessment & Plan Note (Signed)
Continues gyn f/u now yearly

## 2016-06-27 NOTE — Assessment & Plan Note (Signed)
Vitamin D level is therapeutic with current supplementation Disc importance of this to bone and overall health Level of 38  Takes 2000 iu daily

## 2016-06-27 NOTE — Assessment & Plan Note (Signed)
Last dexa was in the normal range  She takes 2000 iu vit D Level is 38  Disc need for calcium/ vitamin D/ wt bearing exercise and bone density test every 2 y to monitor Disc safety/ fracture risk in detail   No longer smoking

## 2016-06-27 NOTE — Assessment & Plan Note (Signed)
Disc goals for lipids and reasons to control them Rev labs with pt Rev low sat fat diet in detail  On simvastatin and diet  LDL up from 108 to 114 Do not want this to go higher  Diet counseling done/handout given

## 2016-06-27 NOTE — Assessment & Plan Note (Signed)
Hypothyroidism  Pt has no clinical changes No change in energy level/ hair or skin/ edema and no tremor Lab Results  Component Value Date   TSH 1.83 05/09/2016

## 2016-06-27 NOTE — Assessment & Plan Note (Signed)
Discussed how this problem influences overall health and the risks it imposes  Reviewed plan for weight loss with lower calorie diet (via better food choices and also portion control or program like weight watchers) and exercise building up to or more than 30 minutes 5 days per week including some aerobic activity   Exercise is challenging-suggested chair exercise program

## 2016-06-27 NOTE — Assessment & Plan Note (Signed)
Some white coat component  Improved on 2nd check

## 2016-07-07 ENCOUNTER — Other Ambulatory Visit: Payer: Self-pay | Admitting: Obstetrics & Gynecology

## 2016-07-07 DIAGNOSIS — R232 Flushing: Secondary | ICD-10-CM

## 2016-08-21 ENCOUNTER — Ambulatory Visit (INDEPENDENT_AMBULATORY_CARE_PROVIDER_SITE_OTHER): Payer: Medicare Other | Admitting: Obstetrics & Gynecology

## 2016-08-21 ENCOUNTER — Encounter: Payer: Self-pay | Admitting: Obstetrics & Gynecology

## 2016-08-21 VITALS — BP 159/84 | HR 101 | Ht 67.0 in | Wt 210.0 lb

## 2016-08-21 DIAGNOSIS — R232 Flushing: Secondary | ICD-10-CM | POA: Diagnosis not present

## 2016-08-21 DIAGNOSIS — N904 Leukoplakia of vulva: Secondary | ICD-10-CM | POA: Diagnosis not present

## 2016-08-21 MED ORDER — PREMARIN 0.625 MG PO TABS: 0.6250 mg | ORAL_TABLET | Freq: Every day | ORAL | 11 refills | Status: DC

## 2016-08-21 MED ORDER — CLOBETASOL PROPIONATE 0.05 % EX CREA: TOPICAL_CREAM | CUTANEOUS | 6 refills | Status: DC

## 2016-08-21 NOTE — Progress Notes (Signed)
   Subjective:    Patient ID: Lori Orozco, female    DOB: 1950-12-15, 66 y.o.   MRN: [id number]  HPI  66 yo lady here for a vulva check. She has long standing lichen sclerosis. She had her annual exam recently with Dr. Milinda Antis. She needs a refill of her premarin (she had a hysterectomy).  Review of Systems     Objective:   Physical Exam  [id number] Breathing, conversing, and ambulating normally (although with her COPD, she has some heavy breathing with exertion) Her vulva shows varicosities, atrophy, and the only sign of lichen today is at the introitus (where I have biopsied several times in the past). There is no evidence of vulvar dysplasia or cancer.      Assessment & Plan:  Lichen- continue temovate 2-3 times per week Hot flashes- she does not want to stop her premarin 0.625 mg (still having mild hot flashes). Refill given RTC 1 year/prn sooner

## 2016-09-12 ENCOUNTER — Encounter (INDEPENDENT_AMBULATORY_CARE_PROVIDER_SITE_OTHER): Payer: Self-pay | Admitting: Orthopaedic Surgery

## 2016-09-12 ENCOUNTER — Ambulatory Visit (INDEPENDENT_AMBULATORY_CARE_PROVIDER_SITE_OTHER): Payer: Medicare Other | Admitting: Orthopaedic Surgery

## 2016-09-12 DIAGNOSIS — M79651 Pain in right thigh: Secondary | ICD-10-CM | POA: Diagnosis not present

## 2016-09-12 NOTE — Progress Notes (Signed)
The patient is well-known to me. She is following up after having a trochanteric bursectomy of her right hip. She still has some pain of her IT band distally would like to have an injection today in that area since she is heading on a cruise in August to [place]. She is someone who is also had a left total knee arthroplasty 16 months ago by [place]. She still gets some swelling in that knee.  On examination of her right hip there is no further pain over trochanteric area but there is still pain at the mid IT band. I was able to prep this area that alcohol dried injection of 3 mL lidocaine 1 mL of Depo-Medrol. This far as her left knee goes she's got excellent range of motion of his left knee there is no redness. There is some slight laxity in Lachman's exam.  I'll see her back in 2 months I would like an AP and lateral of that right knee. All questions were encouraged and answered.

## 2016-10-26 ENCOUNTER — Ambulatory Visit (INDEPENDENT_AMBULATORY_CARE_PROVIDER_SITE_OTHER): Payer: Medicare Other | Admitting: Family Medicine

## 2016-10-26 ENCOUNTER — Encounter: Payer: Self-pay | Admitting: Family Medicine

## 2016-10-26 VITALS — BP 128/64 | HR 76 | Temp 98.2°F | Ht 67.0 in | Wt 209.8 lb

## 2016-10-26 DIAGNOSIS — M7061 Trochanteric bursitis, right hip: Secondary | ICD-10-CM

## 2016-10-26 DIAGNOSIS — R600 Localized edema: Secondary | ICD-10-CM

## 2016-10-26 MED ORDER — FUROSEMIDE 20 MG PO TABS: 10.0000 mg | ORAL_TABLET | Freq: Every day | ORAL | 0 refills | Status: DC | PRN

## 2016-10-26 NOTE — Progress Notes (Signed)
Subjective:    Patient ID: Lori Orozco, female    DOB: 06/20/1950, 66 y.o.   MRN: [id number]  HPI  Here for swelling of feet and ankles  Just got back from a cruise  Her legs tend to swell from sitting   (was on plane for 6 hours) Once she got on the ship - swelling in ankles and feet got worse and went up to her thighs Then sore to touch Then itchy rash on both ankles and top of L foot   (used a cortisone cream)  When she got home and back in recliner with feet up and it improved   She does not have support stockings (used to have them)  Has some ted hose at the house    Did not feel sob  No chest discomfort Felt fine   Has some varicose veins   Hx of orthopedic issues   Wt Readings from Last 3 Encounters:  10/26/16 209 lb 12 oz (95.1 kg)  08/21/16 210 lb (95.3 kg)  06/26/16 211 lb (95.7 kg)   32.85 kg/m  Pulse ox 94% on RA  BP Readings from Last 3 Encounters:  10/26/16 128/64  08/21/16 (!) 159/84  06/26/16 140/86   Hypothyroidism  Pt has no clinical changes No change in energy level/ hair or skin/ edema and no tremor Lab Results  Component Value Date   TSH 1.83 05/09/2016     Right now -fairly back to normal   She does try to drink fluids regularly  She tried to eat more fruit and less salty foods    Patient Active Problem List   Diagnosis Date Noted  . Pedal edema 10/26/2016  . Right thigh pain 09/12/2016  . Trochanteric bursitis, right hip 04/10/2016  . Elevated BP without diagnosis of hypertension 09/02/2015  . Urge incontinence 07/27/2015  . Obesity 04/28/2015  . Need for hepatitis C screening test 04/26/2015  . Screening for HIV (human immunodeficiency virus) 04/26/2015  . Osteoarthritis of left knee 01/28/2015  . Status post total left knee replacement 01/28/2015  . Vitamin D deficiency 04/27/2014  . Seasonal and perennial allergic rhinitis 06/17/2013  . S/P total hip arthroplasty 03/20/2013  . Lichen sclerosus et atrophicus of  the vulva 01/29/2013  . Encounter for Medicare annual wellness exam 11/18/2012  . History of falling 11/18/2012  . Routine general medical examination at a health care facility 11/10/2012  . 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 Osteopenia 10/19/2011  . Other screening mammogram 10/19/2011  . Hypothyroid 10/19/2011  . ANEMIA 10/11/2009  . PERIPHERAL NEUROPATHY, FEET 08/05/2009  . BACK PAIN 08/05/2009  . HAND PAIN, BILATERAL 08/05/2009  . TREMOR 03/09/2008  . MENOPAUSAL SYNDROME 10/09/2007  . DEPRESSION, MAJOR 05/20/2007  . BIPOLAR AFFECTIVE DISORDER 05/20/2007  . ANXIETY 05/20/2007  . 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 ESOPHAGEAL SPASM 05/20/2007  . HIATAL HERNIA 05/20/2007  . AMAUROSIS FUGAX 05/07/2007  . COPD mixed type (HCC) 03/27/2007  . HYPERCHOLESTEROLEMIA, PURE 12/10/2006  . SYMPTOM, SYNDROME, CHRONIC FATIGUE 12/10/2006   Past Medical History:  Diagnosis Date  . Amaurosis fugax   . Anemia    hx  . Anxiety   . Arthritis   . Asthma   . Cataract   . Chronic fatigue syndrome   . Chronic kidney disease    frequency, nephritis when 66 yrs old  . COPD (chronic obstructive pulmonary disease) (HCC)   . Depression   . Diverticulitis   . Emphysema of lung (  HCC)   . Fibromyalgia   . GERD (gastroesophageal reflux disease)    occ  . H/O hiatal hernia   . History of bronchitis   . Hyperlipidemia   . Hypothyroidism   . Interstitial cystitis   . Irritable bowel syndrome   . Lichen sclerosus   . Lumbar herniated disc   . Migraines   . Pneumonia   . PONV (postoperative nausea and vomiting)   . Shortness of breath    exertion   . Thyroid disease    Graves  . Urinary frequency   . Urinary tract infection   . Vertigo    Past Surgical History:  Procedure Laterality Date  . ABDOMINAL HYSTERECTOMY    . bladder sugery     . CAROTID DOPPLAR    . COLONOSCOPY  05/26/2010   avms- otherwise nl , re check 10y  . DEXA-OSTEOPENIA    . DOPPLER  ECHOCARDIOGRAPHY    . EPICONDYLITIS    . EYE SURGERY Bilateral    cataracts  . FOOT SURGERY Bilateral   . KNEE SURGERY     Left cartilage  . lipoma in second finger right hand    . PLANTAR FASCIA SURGERY Left   . TEMPOROMANDIBULAR JOINT SURGERY    . TONSILLECTOMY    . TOTAL HIP ARTHROPLASTY Right 03/20/2013   Procedure: Right TOTAL HIP ARTHROPLASTY;  Surgeon: [name], MD;  Location: [facility] OR;  Service: Orthopedics;  Laterality: Right;  Right Total Hip Arthroplasty  . TOTAL KNEE ARTHROPLASTY Left 01/28/2015   Procedure: LEFT TOTAL KNEE ARTHROPLASTY;  Surgeon: [name], MD;  Location: [facility];  Service: Orthopedics;  Laterality: Left;  . TRIGGER FINGER RELEASE Right   . TROCHANTERIC BURSA EXCISION Right 05/03/2016   Dr. [name], [name]  . WRIST ARTHROSCOPY Right    ligament tear   Social History  Substance Use Topics  . Smoking status: Former Smoker    Packs/day: 2.00    Years: 43.00    Types: Cigarettes    Quit date: 02/26/2013  . Smokeless tobacco: Never Used     Comment: "vaping now"  . Alcohol use No   Family History  Problem Relation Age of Onset  . Thyroid disease Mother   . Hypertension Mother   . Kidney disease Mother   . Cancer Father   . Colon cancer Other    Allergies  Allergen Reactions  . Lithium Anaphylaxis  . Tegretol [Carbamazepine] Other (See Comments)    Fever and body aches (fever over 103)  . Tricyclic Antidepressants Anaphylaxis  . Codeine Nausea Only    Makes pt stay awake  . Cymbalta [Duloxetine Hcl] Other (See Comments)    Makes pt pass out   . Erythromycin     REACTION: abdominal pain  . Lyrica [Pregabalin]     Felt faint  . Neurontin [Gabapentin]     Passes  out  . Rabeprazole Sodium     REACTION: insomnia  . Duraprep [employer], Misc.] Itching and Rash    Tolerates Betadine   . Penicillins Rash    Has patient had a PCN reaction causing immediate rash, facial/tongue/throat swelling, SOB or  lightheadedness with hypotension: Yes Has patient had a PCN reaction causing severe rash involving mucus membranes or skin necrosis: No Has patient had a PCN reaction that required hospitalization No Has patient had a PCN reaction occurring within the last 10 years: No If all of the above answers are "NO", then may proceed with Cephalosporin use.   Marland Kitchen  Tape Rash    PT ALLERGIC NYLON TAPE    Current Outpatient Prescriptions on File Prior to Visit  Medication Sig Dispense Refill  . aspirin 81 MG chewable tablet Chew 81 mg by mouth at bedtime.     . baclofen (LIORESAL) 10 MG tablet Take 1 tablet (10 mg total) by mouth every 8 (eight) hours as needed for muscle spasms (Pain). 60 tablet 0  . buPROPion (WELLBUTRIN XL) 150 MG 24 hr tablet Take 150 mg by mouth daily.      . busPIRone (BUSPAR) 15 MG tablet Take 15 mg by mouth 2 (two) times daily before a meal.     . Cholecalciferol (VITAMIN D3) 2000 UNITS TABS Take 2,000 Units by mouth daily.    . clobetasol cream (TEMOVATE) 0.05 % APPLY SMALL AMOUNT TO AFFECTED AREAS 2 TO 3 TIMES PER WEEK 15 g 6  . cyanocobalamin 2000 MCG tablet Take 2,000 mcg by mouth daily.    . divalproex (DEPAKOTE ER) 250 MG 24 hr tablet     . fluticasone (FLONASE) 50 MCG/ACT nasal spray PLACE 2 SPRAYS INTO THE NOSE DAILY. 16 g 5  . levothyroxine (SYNTHROID, LEVOTHROID) 50 MCG tablet TAKE 1 TABLET (50 MCG TOTAL) BY MOUTH DAILY. 30 tablet 11  . LORazepam (ATIVAN) 1 MG tablet Take 1 mg by mouth 2 (two) times daily as needed for anxiety.    . mirabegron ER (MYRBETRIQ) 25 MG TB24 tablet Take 1 tablet (25 mg total) by mouth daily. 30 tablet 11  . mirtazapine (REMERON) 30 MG tablet Take 30 mg by mouth at bedtime.     Marland Kitchen PREMARIN 0.625 MG tablet Take 1 tablet (0.625 mg total) by mouth daily. 30 tablet 11  . PROAIR HFA 108 (90 Base) MCG/ACT inhaler INHALE 2 PUFFS INTO THE LUNGS EVERY 4 (FOUR) HOURS AS NEEDED FOR WHEEZING. 8.5 g 0  . scopolamine (TRANSDERM-SCOP, 1.5 MG,) 1 MG/3DAYS Place 1  patch (1.5 mg total) onto the skin every 3 (three) days. 10 patch 0  . sertraline (ZOLOFT) 100 MG tablet Take 100 mg by mouth daily.     . simvastatin (ZOCOR) 20 MG tablet Take 1 tablet (20 mg total) by mouth at bedtime. 30 tablet 11   No current facility-administered medications on file prior to visit.     Review of Systems    Review of Systems  Constitutional: Negative for fever, appetite change, fatigue and unexpected weight change.  Eyes: Negative for pain and visual disturbance.  Respiratory: Negative for cough and shortness of breath.   Cardiovascular: Negative for cp or palpitations   neg for PND/orthopnea    Pos for prev pedal edema that is now gone  Gastrointestinal: Negative for nausea, diarrhea and constipation.  Genitourinary: Negative for urgency and frequency.  Skin: Negative for pallor or rash   Neurological: Negative for weakness, light-headedness, numbness and headaches.  Hematological: Negative for adenopathy. Does not bruise/bleed easily.  Psychiatric/Behavioral: Negative for dysphoric mood. The patient is not nervous/anxious.      Objective:   Physical Exam  Constitutional: She appears well-developed and well-nourished. No distress.  overwt and well app  HENT:  Head: Normocephalic and atraumatic.  Mouth/Throat: Oropharynx is clear and moist.  Eyes: Pupils are equal, round, and reactive to light. Conjunctivae and EOM are normal.  Neck: Normal range of motion. Neck supple. No JVD present. Carotid bruit is not present. No thyromegaly present.  Cardiovascular: Normal rate, regular rhythm, normal heart sounds and intact distal pulses.  Exam reveals no gallop.  Pulmonary/Chest: Effort normal and breath sounds normal. No respiratory distress. She has no wheezes. She has no rales.  No crackles  Abdominal: Soft. Bowel sounds are normal. She exhibits no distension, no abdominal bruit and no mass. There is no tenderness.  Musculoskeletal: She exhibits no edema or  tenderness.  No edema/pitting today  Small varicosities noted Pedal pulses intact No rash or skin change No warmth/tenderness or homan's sign   Limited rom R hip due to bursitis    Lymphadenopathy:    She has no cervical adenopathy.  Neurological: She is alert. She has normal reflexes. She exhibits normal muscle tone. Coordination normal.  Skin: Skin is warm and dry. No rash noted. No erythema. No pallor.  Psychiatric: She has a normal mood and affect.          Assessment & Plan:   Problem List Items Addressed This Visit      Musculoskeletal and Integument   Trochanteric bursitis, right hip    Pt is still struggling with this and had a cortisone shot last mo   She will f/u with orthopedics        Other   Pedal edema    S/p 6 hour plane ride and cruise Now resolved after leg elevation at home in recliner  Suspect multifactorial with travel/prolonged sitting/ varicose veins/salty food/ change in fluid intake/ hx of multiple orthopedic surgeries and cortisone shot before she left  Suspect rash was from edema (also resolved now)  Continue elevation  From now on for travel will wear her ted hose to the thigh for any sitting over 45 min  Given px for lasix 20 mg (1/2 to 1 daily prn) for edema while traveling (aware will need labs if she takes it regularly) Keep posted Nl exam today and no cardiac red flags

## 2016-10-26 NOTE — Assessment & Plan Note (Signed)
S/p 6 hour plane ride and cruise Now resolved after leg elevation at home in recliner  Suspect multifactorial with travel/prolonged sitting/ varicose veins/salty food/ change in fluid intake/ hx of multiple orthopedic surgeries and cortisone shot before she left  Suspect rash was from edema (also resolved now)  Continue elevation  From now on for travel will wear her ted hose to the thigh for any sitting over 45 min  Given px for lasix 20 mg (1/2 to 1 daily prn) for edema while traveling (aware will need labs if she takes it regularly) Keep posted Nl exam today and no cardiac red flags

## 2016-10-26 NOTE — Patient Instructions (Addendum)
I think your swelling was multifactorial (and the rash was likely from swelling)  Less fluid/more sodium/ sitting/recent cortisone shot/ hx of ortho surgery and some varicose veins)   Next time you travel-wear your Ted hose when sitting for prolonged periods   Always elevate legs when sitting  Keep drinking fluids Avoid salty / processed foods whenever possible   We can try a mild diuretic the next time you travel

## 2016-10-26 NOTE — Assessment & Plan Note (Signed)
Pt is still struggling with this and had a cortisone shot last mo   She will f/u with orthopedics

## 2016-11-12 ENCOUNTER — Ambulatory Visit (INDEPENDENT_AMBULATORY_CARE_PROVIDER_SITE_OTHER): Payer: Medicare Other

## 2016-11-12 ENCOUNTER — Ambulatory Visit (INDEPENDENT_AMBULATORY_CARE_PROVIDER_SITE_OTHER): Payer: Medicare Other | Admitting: Orthopaedic Surgery

## 2016-11-12 DIAGNOSIS — Z96652 Presence of left artificial knee joint: Secondary | ICD-10-CM

## 2016-11-12 DIAGNOSIS — G8929 Other chronic pain: Secondary | ICD-10-CM

## 2016-11-12 DIAGNOSIS — M25561 Pain in right knee: Secondary | ICD-10-CM | POA: Diagnosis not present

## 2016-11-12 DIAGNOSIS — M7061 Trochanteric bursitis, right hip: Secondary | ICD-10-CM | POA: Diagnosis not present

## 2016-11-12 MED ORDER — METHYLPREDNISOLONE ACETATE 40 MG/ML IJ SUSP
40.0000 mg | INTRAMUSCULAR | Status: AC | PRN
  Administered 2016-11-12: 40 mg via INTRAMUSCULAR

## 2016-11-12 MED ORDER — LIDOCAINE HCL 1 % IJ SOLN
1.0000 mL | INTRAMUSCULAR | Status: AC | PRN
  Administered 2016-11-12: 1 mL

## 2016-11-12 NOTE — Progress Notes (Signed)
   Procedure Note  Patient: Lori Orozco             Date of Birth: 1950/11/13           MRN: [id number]             Visit Date: 11/12/2016  Procedures: Visit Diagnoses: Chronic pain of right knee - Plan: XR Knee 1-2 Views Right  Status post total left knee replacement  Trochanteric bursitis, right hip  Trigger Point Inj Date/Time: 11/12/2016 1:28 PM Performed by: [name] Authorized by: [name]   Total # of Trigger Points:  1 Location: lower extremity   Medications #1:  1 mL lidocaine 1 %; 40 mg methylPREDNISolone acetate 40 MG/ML

## 2016-11-12 NOTE — Progress Notes (Signed)
   The patient is well-known to me. We performed a left total knee arthroplasty on her years ago. Should that knee is doing well. Her right knee has had some pain and a but really still thigh pain that she's had. We performed a right trochanteric bursectomy. She's had a total hip arthroplasty 4 years ago on that side by one of my partners. Should she never hurts in the groin and trochanteric pain is gone still over the iliotibial band where she points the source of her pain.  Examination of her right lower extremity shows her knee exam be normal. She does have point tenderness over the iliotibial band but the distal third of the defined femur just a lateral side. Her left knee exam is normal. X-rays of the left knee show well-seated total knee arthroplasty no complicating features.  At this point I'll try a trigger point injection in the mid iliotibial band and see if this will help calm down the inflammation in this area.  I'll see her back in 3 months to likely repeat trigger point injection around her right iliotibial band.

## 2016-11-29 ENCOUNTER — Ambulatory Visit (INDEPENDENT_AMBULATORY_CARE_PROVIDER_SITE_OTHER): Payer: Medicare Other | Admitting: Acute Care

## 2016-11-29 ENCOUNTER — Telehealth: Payer: Self-pay | Admitting: Acute Care

## 2016-11-29 ENCOUNTER — Ambulatory Visit (INDEPENDENT_AMBULATORY_CARE_PROVIDER_SITE_OTHER)
Admission: RE | Admit: 2016-11-29 | Discharge: 2016-11-29 | Disposition: A | Discharge status: Home / Self Care | Payer: Medicare Other | Source: Ambulatory Visit | Attending: Acute Care | Admitting: Acute Care

## 2016-11-29 ENCOUNTER — Telehealth: Payer: Self-pay | Admitting: Internal Medicine

## 2016-11-29 ENCOUNTER — Encounter: Payer: Self-pay | Admitting: Acute Care

## 2016-11-29 VITALS — BP 152/76 | HR 100 | Temp 98.5°F | Ht 67.0 in | Wt 212.0 lb

## 2016-11-29 DIAGNOSIS — J449 Chronic obstructive pulmonary disease, unspecified: Secondary | ICD-10-CM | POA: Diagnosis not present

## 2016-11-29 DIAGNOSIS — R059 Cough, unspecified: Secondary | ICD-10-CM

## 2016-11-29 DIAGNOSIS — R05 Cough: Secondary | ICD-10-CM

## 2016-11-29 MED ORDER — BUDESONIDE-FORMOTEROL FUMARATE 80-4.5 MCG/ACT IN AERO: 2.0000 | INHALATION_SPRAY | Freq: Two times a day (BID) | RESPIRATORY_TRACT | 0 refills | Status: DC

## 2016-11-29 MED ORDER — ALBUTEROL SULFATE HFA 108 (90 BASE) MCG/ACT IN AERS: INHALATION_SPRAY | RESPIRATORY_TRACT | 5 refills | Status: DC

## 2016-11-29 MED ORDER — AZITHROMYCIN 250 MG PO TABS: ORAL_TABLET | ORAL | 0 refills | Status: DC

## 2016-11-29 MED ORDER — PREDNISONE 10 MG PO TABS: ORAL_TABLET | ORAL | 0 refills | Status: DC

## 2016-11-29 NOTE — Telephone Encounter (Signed)
Spoke with pt, advised I would send in Proair but not Flovent because we gave her samples of Symbicort 80 to try and she cannot take both of those medications. Pt understood and will call to let us know if Symbicort is working so we can cal in the Rx. Nothing further is needed.

## 2016-11-29 NOTE — Assessment & Plan Note (Signed)
Mild exacerbation Plan CXR today We will send in a prescription for your Pro Air inhaler. We will give you a sample of Symbicort. 2 puffs twice daily Please take this every day without fail. Rinse mouth after use. Prednisone taper; 10 mg tablets: 4 tabs x 2 days, 3 tabs x 2 days, 2 tabs x 2 days 1 tab x 2 days then stop. Z-pack Tylenol or ibuprofen for body aches  Mucinex 1200 mg once daily with full glass of water for chest congestion. Continue Sudafed and Flonase as you have been doing Fluids and rest. Follow up in 2 weeks with Dr. Maple Hudson or Maralyn Sago NP. Please contact office for sooner follow up if symptoms do not improve or worsen or seek emergency care

## 2016-11-29 NOTE — Progress Notes (Signed)
History of Present Illness Lori Orozco is a 66 y.o. female former smoker( 86-pack-year smoking history, quit 2015)  with COPD, mixed type. She is followed by Dr. Maple Hudson.    11/29/2016 Acute OV: Pt states she has been sick since 11/26/2016. She states she has had low grade fever with body aches and increased secretions that are green in color. She has not had to use her nebs this exacerbation.She is currently not using her maintenance Symbicort Inhaler. She states she has run out of her rescue inhaler. She states she does not have a current prescription for either medication..   Test Results: CXR 11/29/2016>>  No acute cardiopulmonary process  CBC Latest Ref Rng & Units 05/09/2016 04/26/2015 01/29/2015  WBC 4.0 - 10.5 K/uL 7.1 7.2 6.7  Hemoglobin 12.0 - 15.0 g/dL 62.6 94.8 10.5(L)  Hematocrit 36.0 - 46.0 % 37.8 36.3 31.3(L)  Platelets 150.0 - 400.0 K/uL 272.0 278.0 181    BMP Latest Ref Rng & Units 05/09/2016 04/26/2015 01/29/2015  Glucose 70 - 99 mg/dL 99 86 546(E)  BUN 6 - 23 mg/dL 70(J) 50(K) 20  Creatinine 0.40 - 1.20 mg/dL 9.38 1.82 9.93  Sodium 135 - 145 mEq/L 139 138 138  Potassium 3.5 - 5.1 mEq/L 4.8 4.5 4.5  Chloride 96 - 112 mEq/L 100 102 103  CO2 19 - 32 mEq/L 30 30 28   Calcium 8.4 - 10.5 mg/dL 9.6 71.6)     Dg Chest 2 View  Result Date: 11/29/2016 CLINICAL DATA:  Cough, congestion and wheezing x 2 days. Hx of pna. Hx of asthma, COPD, pna. Ex smoker. EXAM: CHEST  2 VIEW COMPARISON:  07/01/2014 FINDINGS: Cardiac silhouette is normal in size and configuration. Normal mediastinal and hilar contours. Mild scarring noted at the lung apices, stable. Lungs are otherwise clear. No pleural effusion pneumothorax. Skeletal structures are demineralized. Old healed right posterolateral fourth rib fractures. IMPRESSION: No active cardiopulmonary disease. Electronically Signed   By: 08/31/2014 M.D.   On: 11/29/2016 12:24   Xr Knee 1-2 Views Right  Result Date: 11/12/2016 An  AP and lateral vacs of the left knee shows a well-seated total knee arthroplasty with no complicating features or evidence of loosening.    Past medical hx Past Medical History:  Diagnosis Date  . Amaurosis fugax   . Anemia    hx  . Anxiety   . Arthritis   . Asthma   . Cataract   . Chronic fatigue syndrome   . Chronic kidney disease    frequency, nephritis when 66 yrs old  . COPD (chronic obstructive pulmonary disease) (HCC)   . Depression   . Diverticulitis   . Emphysema of lung (HCC)   . Fibromyalgia   . GERD (gastroesophageal reflux disease)    occ  . H/O hiatal hernia   . History of bronchitis   . Hyperlipidemia   . Hypothyroidism   . Interstitial cystitis   . Irritable bowel syndrome   . Lichen sclerosus   . Lumbar herniated disc   . Migraines   . Pneumonia   . PONV (postoperative nausea and vomiting)   . Shortness of breath    exertion   . Thyroid disease    Graves  . Urinary frequency   . Urinary tract infection   . Vertigo      Social History  Substance Use Topics  . Smoking status: Former Smoker    Packs/day: 2.00    Years: 43.00    Types: Cigarettes  Quit date: 02/26/2013  . Smokeless tobacco: Never Used     Comment: "vaping now"  . Alcohol use No    Ms.Gurski reports that she quit smoking about 3 years ago. Her smoking use included Cigarettes. She has a 86.00 pack-year smoking history. She has never used smokeless tobacco. She reports that she does not drink alcohol or use drugs.  Tobacco Cessation: Former smoker with an 86 pack year smoking history quit 2015. She is Vaping   Past surgical hx, Family hx, Social hx all reviewed.  Current Outpatient Prescriptions on File Prior to Visit  Medication Sig  . aspirin 81 MG chewable tablet Chew 81 mg by mouth at bedtime.   . baclofen (LIORESAL) 10 MG tablet Take 1 tablet (10 mg total) by mouth every 8 (eight) hours as needed for muscle spasms (Pain).  Marland Kitchen buPROPion (WELLBUTRIN XL) 150 MG 24 hr  tablet Take 150 mg by mouth daily.    . busPIRone (BUSPAR) 15 MG tablet Take 15 mg by mouth 2 (two) times daily before a meal.   . Cholecalciferol (VITAMIN D3) 2000 UNITS TABS Take 2,000 Units by mouth daily.  . clobetasol cream (TEMOVATE) 0.05 % APPLY SMALL AMOUNT TO AFFECTED AREAS 2 TO 3 TIMES PER WEEK  . cyanocobalamin 2000 MCG tablet Take 2,000 mcg by mouth daily.  . divalproex (DEPAKOTE ER) 250 MG 24 hr tablet   . fluticasone (FLONASE) 50 MCG/ACT nasal spray PLACE 2 SPRAYS INTO THE NOSE DAILY.  . furosemide (LASIX) 20 MG tablet Take 0.5-1 tablets (10-20 mg total) by mouth daily as needed. For swelling when traveling  . levothyroxine (SYNTHROID, LEVOTHROID) 50 MCG tablet TAKE 1 TABLET (50 MCG TOTAL) BY MOUTH DAILY.  Marland Kitchen LORazepam (ATIVAN) 1 MG tablet Take 1 mg by mouth 2 (two) times daily as needed for anxiety.  . mirabegron ER (MYRBETRIQ) 25 MG TB24 tablet Take 1 tablet (25 mg total) by mouth daily.  . mirtazapine (REMERON) 30 MG tablet Take 30 mg by mouth at bedtime.   Marland Kitchen PREMARIN 0.625 MG tablet Take 1 tablet (0.625 mg total) by mouth daily.  Marland Kitchen PROAIR HFA 108 (90 Base) MCG/ACT inhaler INHALE 2 PUFFS INTO THE LUNGS EVERY 4 (FOUR) HOURS AS NEEDED FOR WHEEZING.  Marland Kitchen scopolamine (TRANSDERM-SCOP, 1.5 MG,) 1 MG/3DAYS Place 1 patch (1.5 mg total) onto the skin every 3 (three) days.  Marland Kitchen sertraline (ZOLOFT) 100 MG tablet Take 100 mg by mouth daily.   . simvastatin (ZOCOR) 20 MG tablet Take 1 tablet (20 mg total) by mouth at bedtime.   No current facility-administered medications on file prior to visit.      Allergies  Allergen Reactions  . Lithium Anaphylaxis  . Tegretol [Carbamazepine] Other (See Comments)    Fever and body aches (fever over 103)  . Tricyclic Antidepressants Anaphylaxis  . Codeine Nausea Only    Makes pt stay awake  . Cymbalta [Duloxetine Hcl] Other (See Comments)    Makes pt pass out   . Erythromycin     REACTION: abdominal pain  . Lyrica [Pregabalin]     Felt faint  .  Neurontin [Gabapentin]     Passes  out  . Rabeprazole Sodium     REACTION: insomnia  . Duraprep [employer], Misc.] Itching and Rash    Tolerates Betadine   . Penicillins Rash    Has patient had a PCN reaction causing immediate rash, facial/tongue/throat swelling, SOB or lightheadedness with hypotension: Yes Has patient had a PCN reaction causing severe rash involving  mucus membranes or skin necrosis: No Has patient had a PCN reaction that required hospitalization No Has patient had a PCN reaction occurring within the last 10 years: No If all of the above answers are "NO", then may proceed with Cephalosporin use.   . Tape Rash    PT ALLERGIC NYLON TAPE     Review Of Systems:  Constitutional:   No  weight loss, night sweats, + Fevers, +chills,+ fatigue, or  lassitude.  HEENT:   No headaches,  Difficulty swallowing,  Tooth/dental problems, or  Sore throat,                No sneezing, itching, ear ache, nasal congestion, post nasal drip,   CV:  No chest pain,  Orthopnea, PND, swelling in lower extremities, anasarca, dizziness, palpitations, syncope.   GI  No heartburn, indigestion, abdominal pain, nausea, vomiting, diarrhea, change in bowel habits, loss of appetite, bloody stools.   Resp: + shortness of breath with exertion less at rest.  + excess mucus, + productive cough,  No non-productive cough,  No coughing up of blood.  + change in color of mucus.  + wheezing.  No chest wall deformity  Skin: no rash or lesions.  GU: no dysuria, change in color of urine, no urgency or frequency.  No flank pain, no hematuria   MS:  No joint pain or swelling.  No decreased range of motion.  No back pain.  Psych:  No change in mood or affect. No depression or anxiety.  No memory loss.   Vital Signs BP (!) 152/76 (BP Location: Left Arm, Cuff Size: Normal)   Pulse 100   Temp 98.5 F (36.9 C) (Oral)   Ht 5\' 7"  (1.702 m)   Wt 212 lb (96.2 kg)   SpO2 94%   BMI 33.20 kg/m     Physical Exam:  General- No distress,  A&Ox3, pleasant ENT: No sinus tenderness, TM clear, pale nasal mucosa, no oral exudate,no post nasal drip, no LAN Cardiac: S1, S2, regular rate and rhythm, no murmur Chest: + wheeze/ no rales/ dullness; no accessory muscle use, no nasal flaring, no sternal retractions Abd.: Soft Non-tender, nondistended, bowel sounds positive, obese Ext: No clubbing cyanosis, edema Neuro:  normal strength Skin: No rashes, warm and dry Psych: normal mood and behavior   Assessment/Plan  COPD mixed type (HCC) Mild exacerbation Plan CXR today We will send in a prescription for your Pro Air inhaler. We will give you a sample of Symbicort. 2 puffs twice daily Please take this every day without fail. Rinse mouth after use. Prednisone taper; 10 mg tablets: 4 tabs x 2 days, 3 tabs x 2 days, 2 tabs x 2 days 1 tab x 2 days then stop. Z-pack Tylenol or ibuprofen for body aches  Mucinex 1200 mg once daily with full glass of water for chest congestion. Continue Sudafed and Flonase as you have been doing Fluids and rest. Follow up in 2 weeks with Dr. or Maple Hudson NP. Please contact office for sooner follow up if symptoms do not improve or worsen or seek emergency care    Consider flu shot follow-up  Maralyn Sago, NP 11/29/2016  2:13 PM

## 2016-11-29 NOTE — Telephone Encounter (Signed)
Notes recorded by Bevelyn Ngo, NP on 11/29/2016 at 2:15 PM EDT Please call patient and let her know her chest x-ray did not show any pneumonia. Please have her follow-up plan of care developed in the office today. Thank you  Advised pt of results. Pt understood and nothing further is needed.

## 2016-11-29 NOTE — Patient Instructions (Addendum)
It is good to see you today. CXR today We will send in a prescription for your Pro Air inhaler. We will give you a sample of Symbicort. 2 puffs twice daily Please take this every day without fail. Rinse mouth after use. Prednisone taper; 10 mg tablets: 4 tabs x 2 days, 3 tabs x 2 days, 2 tabs x 2 days 1 tab x 2 days then stop. Z-pack Tylenol or ibuprofen for body aches  Mucinex 1200 mg once daily with full glass of water for chest congestion. Continue Sudafed and Flonase as you have been doing Fluids and rest. Follow up in 2 weeks with Dr. Maple Hudson or Maralyn Sago NP. Please contact office for sooner follow up if symptoms do not improve or worsen or seek emergency care

## 2016-11-30 NOTE — Telephone Encounter (Signed)
Entered in error

## 2016-12-13 ENCOUNTER — Encounter: Payer: Self-pay | Admitting: Acute Care

## 2016-12-13 ENCOUNTER — Ambulatory Visit (INDEPENDENT_AMBULATORY_CARE_PROVIDER_SITE_OTHER): Payer: Medicare Other | Admitting: Acute Care

## 2016-12-13 DIAGNOSIS — J449 Chronic obstructive pulmonary disease, unspecified: Secondary | ICD-10-CM

## 2016-12-13 DIAGNOSIS — Z23 Encounter for immunization: Secondary | ICD-10-CM

## 2016-12-13 NOTE — Progress Notes (Signed)
History of Present Illness Lori Orozco is a 66 y.o. female former smoker ( 86-pack-year smoking history, quit 2015)  with COPD, mixed type. She is followed by Dr. Maple Hudson.    12/13/2016 2 week follow up: Pt was seen in the office 11/29/2016 for mild COPD exacerbation.She was treated at that time with a prednisone taper and z pack. She returns today for follow up.She states she is better. She states her fever is gone. Body aches have subsided. Secretions have cleared and now are gone. He cough has resolved. She tried Symbicort, but did not like the taste so she is using her rescue inhaler as needed. She is using her rescue inhaler very rarely at present. She denies fever, chest pain, orthopnea or hemoptysis.  Test Results:  CBC Latest Ref Rng & Units 05/09/2016 04/26/2015 01/29/2015  WBC 4.0 - 10.5 K/uL 7.1 7.2 6.7  Hemoglobin 12.0 - 15.0 g/dL 71.2 45.8 10.5(L)  Hematocrit 36.0 - 46.0 % 37.8 36.3 31.3(L)  Platelets 150.0 - 400.0 K/uL 272.0 278.0 181    BMP Latest Ref Rng & Units 05/09/2016 04/26/2015 01/29/2015  Glucose 70 - 99 mg/dL 99 86 099(I)  BUN 6 - 23 mg/dL 33(A) 25(K) 20  Creatinine 0.40 - 1.20 mg/dL 5.39 7.67 3.41  Sodium 135 - 145 mEq/L 139 138 138  Potassium 3.5 - 5.1 mEq/L 4.8 4.5 4.5  Chloride 96 - 112 mEq/L 100 102 103  CO2 19 - 32 mEq/L 30 30 28   Calcium 8.4 - 10.5 mg/dL 9.6 93.7)    PFT No results found for: FEV1PRE, FEV1POST, FVCPRE, FVCPOST, TLC, DLCOUNC, PREFEV1FVCRT, PSTFEV1FVCRT  Dg Chest 2 View  Result Date: 11/29/2016 CLINICAL DATA:  Cough, congestion and wheezing x 2 days. Hx of pna. Hx of asthma, COPD, pna. Ex smoker. EXAM: CHEST  2 VIEW COMPARISON:  07/01/2014 FINDINGS: Cardiac silhouette is normal in size and configuration. Normal mediastinal and hilar contours. Mild scarring noted at the lung apices, stable. Lungs are otherwise clear. No pleural effusion pneumothorax. Skeletal structures are demineralized. Old healed right posterolateral fourth rib  fractures. IMPRESSION: No active cardiopulmonary disease. Electronically Signed   By: 08/31/2014 M.D.   On: 11/29/2016 12:24     Past medical hx Past Medical History:  Diagnosis Date  . Amaurosis fugax   . Anemia    hx  . Anxiety   . Arthritis   . Asthma   . Cataract   . Chronic fatigue syndrome   . Chronic kidney disease    frequency, nephritis when 66 yrs old  . COPD (chronic obstructive pulmonary disease) (HCC)   . Depression   . Diverticulitis   . Emphysema of lung (HCC)   . Fibromyalgia   . GERD (gastroesophageal reflux disease)    occ  . H/O hiatal hernia   . History of bronchitis   . Hyperlipidemia   . Hypothyroidism   . Interstitial cystitis   . Irritable bowel syndrome   . Lichen sclerosus   . Lumbar herniated disc   . Migraines   . Pneumonia   . PONV (postoperative nausea and vomiting)   . Shortness of breath    exertion   . Thyroid disease    Graves  . Urinary frequency   . Urinary tract infection   . Vertigo      Social History  Substance Use Topics  . Smoking status: Former Smoker    Packs/day: 2.00    Years: 43.00    Types: Cigarettes  Quit date: 02/26/2013  . Smokeless tobacco: Never Used     Comment: "vaping now"  . Alcohol use No    Ms.Aloi reports that she quit smoking about 3 years ago. Her smoking use included Cigarettes. She has a 86.00 pack-year smoking history. She has never used smokeless tobacco. She reports that she does not drink alcohol or use drugs.  Tobacco Cessation: Former smoker  Past surgical hx, Family hx, Social hx all reviewed.  Current Outpatient Prescriptions on File Prior to Visit  Medication Sig  . albuterol (PROAIR HFA) 108 (90 Base) MCG/ACT inhaler INHALE 2 PUFFS INTO THE LUNGS EVERY 4 (FOUR) HOURS AS NEEDED FOR WHEEZING.  Marland Kitchen aspirin 81 MG chewable tablet Chew 81 mg by mouth at bedtime.   . baclofen (LIORESAL) 10 MG tablet Take 1 tablet (10 mg total) by mouth every 8 (eight) hours as needed for muscle  spasms (Pain).  . budesonide-formoterol (SYMBICORT) 80-4.5 MCG/ACT inhaler Inhale 2 puffs into the lungs 2 (two) times daily.  Marland Kitchen buPROPion (WELLBUTRIN XL) 150 MG 24 hr tablet Take 150 mg by mouth daily.    . busPIRone (BUSPAR) 15 MG tablet Take 15 mg by mouth 2 (two) times daily before a meal.   . Cholecalciferol (VITAMIN D3) 2000 UNITS TABS Take 2,000 Units by mouth daily.  . clobetasol cream (TEMOVATE) 0.05 % APPLY SMALL AMOUNT TO AFFECTED AREAS 2 TO 3 TIMES PER WEEK  . cyanocobalamin 2000 MCG tablet Take 2,000 mcg by mouth daily.  . divalproex (DEPAKOTE ER) 250 MG 24 hr tablet   . fluticasone (FLONASE) 50 MCG/ACT nasal spray PLACE 2 SPRAYS INTO THE NOSE DAILY.  . furosemide (LASIX) 20 MG tablet Take 0.5-1 tablets (10-20 mg total) by mouth daily as needed. For swelling when traveling  . levothyroxine (SYNTHROID, LEVOTHROID) 50 MCG tablet TAKE 1 TABLET (50 MCG TOTAL) BY MOUTH DAILY.  Marland Kitchen LORazepam (ATIVAN) 1 MG tablet Take 1 mg by mouth 2 (two) times daily as needed for anxiety.  . mirabegron ER (MYRBETRIQ) 25 MG TB24 tablet Take 1 tablet (25 mg total) by mouth daily.  . mirtazapine (REMERON) 30 MG tablet Take 30 mg by mouth at bedtime.   Marland Kitchen PREMARIN 0.625 MG tablet Take 1 tablet (0.625 mg total) by mouth daily.  Marland Kitchen scopolamine (TRANSDERM-SCOP, 1.5 MG,) 1 MG/3DAYS Place 1 patch (1.5 mg total) onto the skin every 3 (three) days.  Marland Kitchen sertraline (ZOLOFT) 100 MG tablet Take 100 mg by mouth daily.   . simvastatin (ZOCOR) 20 MG tablet Take 1 tablet (20 mg total) by mouth at bedtime.   No current facility-administered medications on file prior to visit.      Allergies  Allergen Reactions  . Lithium Anaphylaxis  . Tegretol [Carbamazepine] Other (See Comments)    Fever and body aches (fever over 103)  . Tricyclic Antidepressants Anaphylaxis  . Codeine Nausea Only    Makes pt stay awake  . Cymbalta [Duloxetine Hcl] Other (See Comments)    Makes pt pass out   . Erythromycin     REACTION: abdominal  pain  . Lyrica [Pregabalin]     Felt faint  . Neurontin [Gabapentin]     Passes  out  . Rabeprazole Sodium     REACTION: insomnia  . Duraprep [employer], Misc.] Itching and Rash    Tolerates Betadine   . Penicillins Rash    Has patient had a PCN reaction causing immediate rash, facial/tongue/throat swelling, SOB or lightheadedness with hypotension: Yes Has patient had a PCN  reaction causing severe rash involving mucus membranes or skin necrosis: No Has patient had a PCN reaction that required hospitalization No Has patient had a PCN reaction occurring within the last 10 years: No If all of the above answers are "NO", then may proceed with Cephalosporin use.   . Tape Rash    PT ALLERGIC NYLON TAPE     Review Of Systems:  Constitutional:   No  weight loss, night sweats,  Fevers, chills, fatigue, or  lassitude.  HEENT:   No headaches,  Difficulty swallowing,  Tooth/dental problems, or  Sore throat,                No sneezing, itching, ear ache, nasal congestion, post nasal drip,   CV:  No chest pain,  Orthopnea, PND, swelling in lower extremities, anasarca, dizziness, palpitations, syncope.   GI  No heartburn, indigestion, abdominal pain, nausea, vomiting, diarrhea, change in bowel habits, loss of appetite, bloody stools.   Resp: No shortness of breath with exertion or at rest.  No excess mucus, no productive cough,  No non-productive cough,  No coughing up of blood.  No change in color of mucus.  Faint basline  wheezing.  No chest wall deformity  Skin: no rash or lesions.  GU: no dysuria, change in color of urine, no urgency or frequency.  No flank pain, no hematuria   MS:  No joint pain or swelling.  No decreased range of motion.  No back pain.  Psych:  No change in mood or affect. No depression or anxiety.  No memory loss.   Vital Signs BP (!) 158/80 (BP Location: Left Arm, Cuff Size: Normal)   Pulse 87   Ht 5\' 7"  (1.702 m)   Wt 212 lb 6.4 oz (96.3 kg)   SpO2  95%   BMI 33.27 kg/m    Physical Exam:  General- No distress,  A&Ox3, pleasant ENT: No sinus tenderness, TM clear, pale nasal mucosa, no oral exudate,no post nasal drip, no LAN Cardiac: S1, S2, regular rate and rhythm, no murmur Chest: + faint expiratory wheeze/ rales/ dullness; no accessory muscle use, no nasal flaring, no sternal retractions Abd.: Soft Non-tender Ext: No clubbing cyanosis, edema Neuro:  normal strength, cranial nerves intact Skin: No rashes, warm and dry Psych: normal mood and behavior   Assessment/Plan  COPD mixed type (HCC) Resolution of flare with z-pack and pred taper Plan: We are glad you are better. Continue your Rescue inhaler as needed Continue Flonase as you have been doing. Flu shot today. Follow up with Dr. in 1 year. Please contact office for sooner follow up if symptoms do not improve or worsen or seek emergency care      Maple Hudson, NP 12/13/2016  11:51 AM

## 2016-12-13 NOTE — Patient Instructions (Signed)
It is good to see you today. We are glad you are better. Continue your Rescue inhaler as needed Continue Flonase as you have been doing. Flu shot today. Follow up with Dr. Maple Hudson in 1 year. Please contact office for sooner follow up if symptoms do not improve or worsen or seek emergency care

## 2016-12-13 NOTE — Assessment & Plan Note (Signed)
Resolution of flare with z-pack and pred taper Plan: We are glad you are better. Continue your Rescue inhaler as needed Continue Flonase as you have been doing. Flu shot today. Follow up with Dr. Maple Hudson in 1 year. Please contact office for sooner follow up if symptoms do not improve or worsen or seek emergency care

## 2016-12-31 ENCOUNTER — Ambulatory Visit (INDEPENDENT_AMBULATORY_CARE_PROVIDER_SITE_OTHER): Payer: Medicare Other | Admitting: Physician Assistant

## 2016-12-31 ENCOUNTER — Encounter (INDEPENDENT_AMBULATORY_CARE_PROVIDER_SITE_OTHER): Payer: Self-pay | Admitting: Physician Assistant

## 2016-12-31 ENCOUNTER — Ambulatory Visit (INDEPENDENT_AMBULATORY_CARE_PROVIDER_SITE_OTHER): Payer: Medicare Other

## 2016-12-31 DIAGNOSIS — M25512 Pain in left shoulder: Secondary | ICD-10-CM

## 2016-12-31 MED ORDER — LIDOCAINE HCL 1 % IJ SOLN
0.5000 mL | INTRAMUSCULAR | Status: AC | PRN
  Administered 2016-12-31: .5 mL

## 2016-12-31 MED ORDER — METHYLPREDNISOLONE ACETATE 40 MG/ML IJ SUSP
80.0000 mg | INTRAMUSCULAR | Status: AC | PRN
  Administered 2016-12-31: 80 mg
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
## 2017-01-06 ENCOUNTER — Other Ambulatory Visit (INDEPENDENT_AMBULATORY_CARE_PROVIDER_SITE_OTHER): Payer: Self-pay | Admitting: Physical Medicine and Rehabilitation

## 2017-01-06 DIAGNOSIS — M545 Low back pain: Principal | ICD-10-CM

## 2017-01-06 DIAGNOSIS — G8929 Other chronic pain: Secondary | ICD-10-CM

## 2017-01-07 NOTE — Telephone Encounter (Signed)
Will refill but would nee follow up if needed again, PCP could do

## 2017-01-07 NOTE — Telephone Encounter (Signed)
Please advise 

## 2017-01-14 ENCOUNTER — Ambulatory Visit (INDEPENDENT_AMBULATORY_CARE_PROVIDER_SITE_OTHER): Payer: Medicare Other | Admitting: Physician Assistant

## 2017-01-14 ENCOUNTER — Encounter (INDEPENDENT_AMBULATORY_CARE_PROVIDER_SITE_OTHER): Payer: Self-pay | Admitting: Physician Assistant

## 2017-01-14 DIAGNOSIS — M25512 Pain in left shoulder: Secondary | ICD-10-CM | POA: Diagnosis not present

## 2017-01-14 NOTE — Progress Notes (Signed)
Lori Orozco returns today for follow-up of her left shoulder pain.  She states that the injection gave her a couple of hours of decreased intensity and pain in the shoulder but that was all the relief that she had with the injection.  Still having difficulty with overhead range of motion and with crossing her arm over chest.  She denies any radicular symptoms down the left arm.  Also denies any neck pain.  She is trying to continue to do the exercises shown at last visit which include pendulum, Codman, forward flexion and wall crawls.  Review of systems please see HPI otherwise negative  Physical exam: Left shoulder she is able to actively forward flex to 138 being a temporizing 160 degrees which is quite a great deal of pain with this.  She has weakness with external rotation of the left shoulder next resistance.  Positive impingement testing on the left.  Impression: Left shoulder pain, acute  Plan: Have her continue to work on range of motion of the shoulder.  We will obtain an MRI of her left shoulder to rule out rotator cuff tear.  Have her follow-up after the MRI to go over results and discuss further treatment.  Questions encouraged and answered at length.

## 2017-01-15 ENCOUNTER — Other Ambulatory Visit (INDEPENDENT_AMBULATORY_CARE_PROVIDER_SITE_OTHER): Payer: Self-pay

## 2017-01-15 DIAGNOSIS — M25512 Pain in left shoulder: Principal | ICD-10-CM

## 2017-01-15 DIAGNOSIS — G8929 Other chronic pain: Secondary | ICD-10-CM

## 2017-01-27 ENCOUNTER — Other Ambulatory Visit (INDEPENDENT_AMBULATORY_CARE_PROVIDER_SITE_OTHER): Payer: Self-pay | Admitting: Physical Medicine and Rehabilitation

## 2017-01-27 ENCOUNTER — Ambulatory Visit
Admission: RE | Admit: 2017-01-27 | Discharge: 2017-01-27 | Disposition: A | Discharge status: Home / Self Care | Payer: Medicare Other | Source: Ambulatory Visit | Attending: Orthopaedic Surgery | Admitting: Orthopaedic Surgery

## 2017-01-27 DIAGNOSIS — M25512 Pain in left shoulder: Principal | ICD-10-CM

## 2017-01-27 DIAGNOSIS — G8929 Other chronic pain: Secondary | ICD-10-CM

## 2017-01-27 DIAGNOSIS — M545 Low back pain: Principal | ICD-10-CM

## 2017-01-30 ENCOUNTER — Ambulatory Visit (INDEPENDENT_AMBULATORY_CARE_PROVIDER_SITE_OTHER): Payer: Medicare Other | Admitting: Orthopaedic Surgery

## 2017-01-30 ENCOUNTER — Encounter (INDEPENDENT_AMBULATORY_CARE_PROVIDER_SITE_OTHER): Payer: Self-pay | Admitting: Orthopaedic Surgery

## 2017-01-30 DIAGNOSIS — M7542 Impingement syndrome of left shoulder: Secondary | ICD-10-CM

## 2017-01-30 NOTE — Telephone Encounter (Signed)
Please schedule f/up no hurry, did go ahead and refill

## 2017-01-30 NOTE — Telephone Encounter (Signed)
Please advise. Follow up vs PCP suggested on last refill request.

## 2017-01-30 NOTE — Progress Notes (Signed)
The patient is following up after an MRI of her left shoulder due to severe shoulder pain and the failure of conservative treatment.  Even steroid injection did not make her pain go away and she is having a throbbing and hurting her significantly.  On exam she has fullness in the subacromial area of her shoulder.  There is significant pain with external rotation and abduction as well as a positive crossarm test.  Rotator cuff itself feels strong.  She is neurovascular intact.  Her neck exam is normal.  MRI of her shoulders reviewed with her and shows a large collection of fluid in the subacromial subdeltoid bursa showing severe impingement syndrome.  There is mild acromioclavicular arthritic changes.  The rotator cuff itself is intact.  There is no significant arthritic changes.  The biceps tendon is intact.  Given the severity of her impingement syndrome and the severity of her bursitis we are recommending an arthroscopic intervention of this left shoulder with a subacromial decompression and debridement.  I showed her a shoulder model and went over her MRI and explained in detail with the surgery involved.  I do feel that is necessary at this point given further conservative treatment infected injections did not help her even when in bed.  With these results certainly surgery could help alleviate her pain symptoms.  I talked about the risk of surgery with her operative and postoperative course would involve.  She is interested in having this set up in the near future given the significance of her pain.  We can see her back in 1 week postoperative for suture removal.  All questions concerns were answered and addressed.

## 2017-02-11 ENCOUNTER — Ambulatory Visit (INDEPENDENT_AMBULATORY_CARE_PROVIDER_SITE_OTHER): Payer: Medicare Other | Admitting: Orthopaedic Surgery

## 2017-02-14 DIAGNOSIS — M7542 Impingement syndrome of left shoulder: Secondary | ICD-10-CM | POA: Diagnosis not present

## 2017-02-14 DIAGNOSIS — M7552 Bursitis of left shoulder: Secondary | ICD-10-CM | POA: Diagnosis not present

## 2017-02-17 ENCOUNTER — Other Ambulatory Visit (INDEPENDENT_AMBULATORY_CARE_PROVIDER_SITE_OTHER): Payer: Self-pay | Admitting: Physical Medicine and Rehabilitation

## 2017-02-17 DIAGNOSIS — M545 Low back pain: Principal | ICD-10-CM

## 2017-02-17 DIAGNOSIS — G8929 Other chronic pain: Secondary | ICD-10-CM

## 2017-02-25 ENCOUNTER — Encounter (INDEPENDENT_AMBULATORY_CARE_PROVIDER_SITE_OTHER): Payer: Self-pay | Admitting: Orthopaedic Surgery

## 2017-02-25 ENCOUNTER — Ambulatory Visit (INDEPENDENT_AMBULATORY_CARE_PROVIDER_SITE_OTHER): Payer: Medicare Other | Admitting: Orthopaedic Surgery

## 2017-02-25 DIAGNOSIS — Z9889 Other specified postprocedural states: Secondary | ICD-10-CM

## 2017-02-25 NOTE — Progress Notes (Signed)
The patient is 11 days out from a left shoulder arthroscopy.  She actually had to go to the emergency room the weekend after her surgery due to some type of reaction she had from she felt the nerve block.  After given some pain medication as well as IV steroids and I believe some antibiotic she did much better overall.  She says she has a throbbing pain but feels like she is making progress now.  She said is still sore to her.  On exam her axillary nerve function is entirely intact.  She can hold her shoulder at 90 degrees abduction without difficulty.  I can lift her arm above her head as well with some pain.  All incision sites look good there is no evidence of infection.  Removed all sutures.  I went over arthroscopy pictures and showed extent of the debridement subacromial decompression be performed.  She will still limit overhead activities to only calling her hand up the wall twice daily until I see her back in 4 weeks.  All questions concerns were answered and addressed.  She states she does not knee pain medication right now.

## 2017-03-05 ENCOUNTER — Other Ambulatory Visit (INDEPENDENT_AMBULATORY_CARE_PROVIDER_SITE_OTHER): Payer: Self-pay | Admitting: Physical Medicine and Rehabilitation

## 2017-03-05 ENCOUNTER — Ambulatory Visit (INDEPENDENT_AMBULATORY_CARE_PROVIDER_SITE_OTHER): Payer: Medicare Other | Admitting: Physical Medicine and Rehabilitation

## 2017-03-05 ENCOUNTER — Encounter (INDEPENDENT_AMBULATORY_CARE_PROVIDER_SITE_OTHER): Payer: Self-pay | Admitting: Physical Medicine and Rehabilitation

## 2017-03-05 DIAGNOSIS — M545 Low back pain, unspecified: Secondary | ICD-10-CM

## 2017-03-05 DIAGNOSIS — G894 Chronic pain syndrome: Secondary | ICD-10-CM | POA: Diagnosis not present

## 2017-03-05 DIAGNOSIS — G8929 Other chronic pain: Secondary | ICD-10-CM

## 2017-03-05 MED ORDER — BACLOFEN 10 MG PO TABS: ORAL_TABLET | ORAL | 6 refills | Status: DC

## 2017-03-05 NOTE — Telephone Encounter (Signed)
Patient seen today.    Please advise.

## 2017-03-05 NOTE — Progress Notes (Signed)
Lori Orozco is a 67 year old female that I have seen in the past for her lower back.  She has a history of chronic low back pain and chronic pain syndrome.  Her situation is complicated by concomitant depression and anxiety on psychotropic medications including mirtazapine, sertraline, lorazepam, buspirone, appropriate on, Depakote baclofen.  I have been prescribing baclofen for her back spasms that she does take very intermittently see her for follow-up since is been quite some time since we had seen her still make sure the baclofen was safe to prescribe.  We did review her case today and spoke approximately 10 minutes about her back pain.  Recently had shoulder arthroscopy by Dr. [name] and continues to follow with him.

## 2017-03-11 ENCOUNTER — Ambulatory Visit (INDEPENDENT_AMBULATORY_CARE_PROVIDER_SITE_OTHER): Payer: Medicare Other | Admitting: Orthopaedic Surgery

## 2017-03-15 ENCOUNTER — Other Ambulatory Visit: Payer: Self-pay | Admitting: Family Medicine

## 2017-03-18 ENCOUNTER — Encounter (INDEPENDENT_AMBULATORY_CARE_PROVIDER_SITE_OTHER): Payer: Self-pay | Admitting: Physical Medicine and Rehabilitation

## 2017-03-25 ENCOUNTER — Encounter (INDEPENDENT_AMBULATORY_CARE_PROVIDER_SITE_OTHER): Payer: Self-pay | Admitting: Orthopaedic Surgery

## 2017-03-25 ENCOUNTER — Ambulatory Visit (INDEPENDENT_AMBULATORY_CARE_PROVIDER_SITE_OTHER): Payer: Medicare Other | Admitting: Orthopaedic Surgery

## 2017-03-25 DIAGNOSIS — Z9889 Other specified postprocedural states: Secondary | ICD-10-CM

## 2017-03-25 DIAGNOSIS — M7702 Medial epicondylitis, left elbow: Secondary | ICD-10-CM | POA: Diagnosis not present

## 2017-03-25 MED ORDER — HYDROCODONE-ACETAMINOPHEN 5-325 MG PO TABS: 1.0000 | ORAL_TABLET | Freq: Two times a day (BID) | ORAL | 0 refills | Status: DC | PRN

## 2017-03-25 MED ORDER — METHYLPREDNISOLONE ACETATE 40 MG/ML IJ SUSP
40.0000 mg | INTRAMUSCULAR | Status: AC | PRN
  Administered 2017-03-25: 40 mg

## 2017-03-25 MED ORDER — LIDOCAINE HCL 1 % IJ SOLN
1.0000 mL | INTRAMUSCULAR | Status: AC | PRN
  Administered 2017-03-25: 1 mL

## 2017-03-25 NOTE — Progress Notes (Signed)
Office Visit Note   Patient: Lori Orozco           Date of Birth: 06-11-1950           MRN: [id number] Visit Date: 03/25/2017              Requested by: Tower, Audrie Gallus, MD [address] [place], [place] [address] PCP: [name], MD   Assessment & Plan: Visit Diagnoses:  1. Medial epicondylitis, left elbow   2. S/P arthroscopy of left shoulder     Plan: I agreed with her trying steroid injection in the epicondylar area of her left elbow and she tolerated this well.  I explained the risk and benefits of these injections.  I will send in some more hydrocodone for her.  I will see her back in a month see how she is doing overall.  She can also take anti-inflammatories and limit her bowling.  Follow-Up Instructions: Return in about 4 weeks (around 04/22/2017).   Orders:  No orders of the defined types were placed in this encounter.  No orders of the defined types were placed in this encounter.     Procedures: Hand/UE Inj: L elbow for medial epicondylitis on 03/25/2017 2:26 PM Medications: 1 mL lidocaine 1 %; 40 mg methylPREDNISolone acetate 40 MG/ML      Clinical Data: No additional findings.   Subjective: Chief Complaint  Patient presents with  . Left Shoulder - Follow-up  The patient comes in today in continued follow-up for left shoulder arthroscopy about 1 month out.  She is feeling somewhat better.  She is been having a lot of problems with medial epicondylar area of her left elbow and that is bothering her quite a bit.  She is requesting injection in that today.  She is an avid ballroom bowels about once a week.  She is also needing refill on hydrocodone.  HPI  Review of Systems She currently denies any fever and chills or nausea vomiting.  Objective: Vital Signs: There were no vitals taken for this visit.  Physical Exam She is alert and oriented x3 and in no acute distress Ortho Exam Examination of her left shoulder shows more fluid range  of motion and less pain.  Examination of her elbow has severe tenderness over the medial epicondylar area with a negative Tinel sign over the cubital tunnel. Specialty Comments:  No specialty comments available.  Imaging: No results found.   PMFS History: Patient Active Problem List   Diagnosis Date Noted  . S/P arthroscopy of left shoulder 02/25/2017  . Impingement syndrome of left shoulder 01/30/2017  . Chronic pain of right knee 11/12/2016  . Pedal edema 10/26/2016  . Right thigh pain 09/12/2016  . Trochanteric bursitis, right hip 04/10/2016  . Elevated BP without diagnosis of hypertension 09/02/2015  . Urge incontinence 07/27/2015  . Obesity 04/28/2015  . Need for hepatitis C screening test 04/26/2015  . Screening for HIV (human immunodeficiency virus) 04/26/2015  . Osteoarthritis of left knee 01/28/2015  . Status post total left knee replacement 01/28/2015  . Vitamin D deficiency 04/27/2014  . Seasonal and perennial allergic rhinitis 06/17/2013  . S/P total hip arthroplasty 03/20/2013  . 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 Encounter for Medicare annual wellness exam 11/18/2012  . History of falling 11/18/2012  . Routine general medical examination at a health care facility 11/10/2012  . 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 Osteopenia 10/19/2011  .  Other screening mammogram 10/19/2011  . Hypothyroid 10/19/2011  . ANEMIA 10/11/2009  . PERIPHERAL NEUROPATHY, FEET 08/05/2009  . BACK PAIN 08/05/2009  . HAND PAIN, BILATERAL 08/05/2009  . TREMOR 03/09/2008  . MENOPAUSAL SYNDROME 10/09/2007  . DEPRESSION, MAJOR 05/20/2007  . BIPOLAR AFFECTIVE DISORDER 05/20/2007  . ANXIETY 05/20/2007  . 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 ESOPHAGEAL SPASM 05/20/2007  . HIATAL HERNIA 05/20/2007  . AMAUROSIS FUGAX 05/07/2007  . COPD mixed type (HCC) 03/27/2007  . HYPERCHOLESTEROLEMIA, PURE 12/10/2006  . SYMPTOM, SYNDROME, CHRONIC FATIGUE  12/10/2006   Past Medical History:  Diagnosis Date  . Amaurosis fugax   . Anemia    hx  . Anxiety   . Arthritis   . Asthma   . Cataract   . Chronic fatigue syndrome   . Chronic kidney disease    frequency, nephritis when 67 yrs old  . COPD (chronic obstructive pulmonary disease) (HCC)   . Depression   . Diverticulitis   . Emphysema of lung (HCC)   . Fibromyalgia   . GERD (gastroesophageal reflux disease)    occ  . H/O hiatal hernia   . History of bronchitis   . Hyperlipidemia   . Hypothyroidism   . Interstitial cystitis   . Irritable bowel syndrome   . Lichen sclerosus   . Lumbar herniated disc   . Migraines   . Pneumonia   . PONV (postoperative nausea and vomiting)   . Shortness of breath    exertion   . Thyroid disease    Graves  . Urinary frequency   . Urinary tract infection   . Vertigo     Family History  Problem Relation Age of Onset  . Thyroid disease Mother   . Hypertension Mother   . Kidney disease Mother   . Cancer Father   . Colon cancer Other     Past Surgical History:  Procedure Laterality Date  . ABDOMINAL HYSTERECTOMY    . bladder sugery     . CAROTID DOPPLAR    . COLONOSCOPY  05/26/2010   avms- otherwise nl , re check 10y  . DEXA-OSTEOPENIA    . DOPPLER ECHOCARDIOGRAPHY    . EPICONDYLITIS    . EYE SURGERY Bilateral    cataracts  . FOOT SURGERY Bilateral   . KNEE SURGERY     Left cartilage  . lipoma in second finger right hand    . PLANTAR FASCIA SURGERY Left   . TEMPOROMANDIBULAR JOINT SURGERY    . TONSILLECTOMY    . TOTAL HIP ARTHROPLASTY Right 03/20/2013   Procedure: Right TOTAL HIP ARTHROPLASTY;  Surgeon: [name], MD;  Location: [facility];  Service: Orthopedics;  Laterality: Right;  Right Total Hip Arthroplasty  . TOTAL KNEE ARTHROPLASTY Left 01/28/2015   Procedure: LEFT TOTAL KNEE ARTHROPLASTY;  Surgeon: [name], MD;  Location: [facility];  Service: Orthopedics;  Laterality: Left;  . TRIGGER FINGER RELEASE Right     . TROCHANTERIC BURSA EXCISION Right 05/03/2016   Dr. [name], [name]  . WRIST ARTHROSCOPY Right    ligament tear   Social History   Occupational History  . Not on file  Tobacco Use  . Smoking status: Former Smoker    Packs/day: 2.00    Years: 43.00    Pack years: 86.00    Types: Cigarettes    Last attempt to quit: 02/26/2013    Years since quitting: 4.0  . Smokeless tobacco: Never Used  . Tobacco comment: "vaping now"  Substance and Sexual  Activity  . Alcohol use: No    Alcohol/week: 0.0 oz  . Drug use: No  . Sexual activity: Not Currently

## 2017-04-29 ENCOUNTER — Encounter (INDEPENDENT_AMBULATORY_CARE_PROVIDER_SITE_OTHER): Payer: Self-pay | Admitting: Orthopaedic Surgery

## 2017-04-29 ENCOUNTER — Ambulatory Visit (INDEPENDENT_AMBULATORY_CARE_PROVIDER_SITE_OTHER): Payer: Medicare Other | Admitting: Orthopaedic Surgery

## 2017-04-29 DIAGNOSIS — M7702 Medial epicondylitis, left elbow: Secondary | ICD-10-CM | POA: Diagnosis not present

## 2017-04-29 DIAGNOSIS — Z9889 Other specified postprocedural states: Secondary | ICD-10-CM

## 2017-04-29 NOTE — Progress Notes (Signed)
The patient is continue to follow-up status post a left shoulder arthroscopy for severe impingement syndrome.  She also has severe left elbow medial epicondylitis.  We did place an injection in the medial epicondyle area month ago at her last appointment.  She says her shoulder and elbow are still giving her a significant  problem in terms of pain.  On exam she does have good range of motion of her left shoulder and left elbow but pain over the medial epicondyle which is quite severe to palpation.  There is no pain over the cubital tunnel.  There is negative Tinel sign the left elbow.  Her function as well as normal elbow other than severe pain over the medial epicondyle area.  Her shoulder function is improving.  At this point I agree with outpatient physical therapy with trying this for her shoulder and elbow with any modalities that can decrease her pain and improve her function.  I would like to see her back in 6 weeks and at that visit I would consider an injection of a steroid both her left shoulder and her left epicondyle area.

## 2017-05-07 ENCOUNTER — Other Ambulatory Visit: Payer: Self-pay | Admitting: Family Medicine

## 2017-05-07 DIAGNOSIS — Z1239 Encounter for other screening for malignant neoplasm of breast: Secondary | ICD-10-CM

## 2017-06-10 ENCOUNTER — Encounter (INDEPENDENT_AMBULATORY_CARE_PROVIDER_SITE_OTHER): Payer: Self-pay | Admitting: Orthopaedic Surgery

## 2017-06-10 ENCOUNTER — Ambulatory Visit (INDEPENDENT_AMBULATORY_CARE_PROVIDER_SITE_OTHER): Payer: Medicare Other | Admitting: Orthopaedic Surgery

## 2017-06-10 DIAGNOSIS — M25522 Pain in left elbow: Secondary | ICD-10-CM | POA: Diagnosis not present

## 2017-06-10 DIAGNOSIS — M25512 Pain in left shoulder: Secondary | ICD-10-CM

## 2017-06-10 DIAGNOSIS — Z9889 Other specified postprocedural states: Secondary | ICD-10-CM | POA: Diagnosis not present

## 2017-06-10 DIAGNOSIS — M7702 Medial epicondylitis, left elbow: Secondary | ICD-10-CM | POA: Diagnosis not present

## 2017-06-10 MED ORDER — LIDOCAINE HCL 1 % IJ SOLN
1.0000 mL | INTRAMUSCULAR | Status: AC | PRN
  Administered 2017-06-10: 1 mL

## 2017-06-10 MED ORDER — LIDOCAINE HCL 1 % IJ SOLN
3.0000 mL | INTRAMUSCULAR | Status: AC | PRN
  Administered 2017-06-10: 3 mL

## 2017-06-10 MED ORDER — METHYLPREDNISOLONE ACETATE 40 MG/ML IJ SUSP
40.0000 mg | INTRAMUSCULAR | Status: AC | PRN
  Administered 2017-06-10: 40 mg via INTRA_ARTICULAR

## 2017-06-10 MED ORDER — METHYLPREDNISOLONE ACETATE 40 MG/ML IJ SUSP
40.0000 mg | INTRAMUSCULAR | Status: AC | PRN
  Administered 2017-06-10: 40 mg

## 2017-06-10 NOTE — Progress Notes (Signed)
Office Visit Note   Patient: Lori Orozco           Date of Birth: 1950/11/06           MRN: [id number] Visit Date: 06/10/2017              Requested by: Tower, Audrie Gallus, MD [address] [place], [place] [address] PCP: [name], MD   Assessment & Plan: Visit Diagnoses:  1. S/P arthroscopy of left shoulder   2. Medial epicondylitis, left elbow     Plan: At this point I want to try just a home exercise program and activity modification.  I do feel she is appropriate to have injections in both her left shoulder and left elbow epicondyle area on the medial side.  She feels the same way and we placed injections in both areas that she tolerated well.  We will see her back in 3 months to see how she is doing overall.  Follow-Up Instructions: Return in about 3 months (around 09/09/2017).   Orders:  Orders Placed This Encounter  Procedures  . Large Joint Inj  . Hand/UE Inj   No orders of the defined types were placed in this encounter.     Procedures: Large Joint Inj: L subacromial bursa on 06/10/2017 1:10 PM Indications: pain and diagnostic evaluation Details: 22 G 1.5 in needle  Arthrogram: No  Medications: 3 mL lidocaine 1 %; 40 mg methylPREDNISolone acetate 40 MG/ML Outcome: tolerated well, no immediate complications Procedure, treatment alternatives, risks and benefits explained, specific risks discussed. Consent was given by the patient. Immediately prior to procedure a time out was called to verify the correct patient, procedure, equipment, support staff and site/side marked as required. Patient was prepped and draped in the usual sterile fashion.   Hand/UE Inj: L elbow for medial epicondylitis on 06/10/2017 1:10 PM Medications: 1 mL lidocaine 1 %; 40 mg methylPREDNISolone acetate 40 MG/ML      Clinical Data: No additional findings.   Subjective: Chief Complaint  Patient presents with  . Left Shoulder - Pain  . Left Elbow - Pain  The patient  is still having significant problems with left shoulder pain after left shoulder arthroscopy with subacromial decompression and extensive debridement.  This was done in December and is now getting close to 4 months since that surgery.  She also has severe medial epicondylitis of her left elbow.  I sent her to physical therapy.  She does help minimally.  She like to have injections in both areas today and I agree with this.  She is not a diabetic.  HPI  Review of Systems She currently denies any headache, chest pain, shortness of breath, fever, chills, nausea, vomiting.  Objective: Vital Signs: There were no vitals taken for this visit.  Physical Exam She is alert and oriented x3 and in no acute distress Ortho Exam Examination of her left shoulder shows full range of motion that is painful.  Examination of her left elbow shows pain over the medial epicondyle area with full range of motion and no elbow instability. Specialty Comments:  No specialty comments available.  Imaging: No results found.   PMFS History: Patient Active Problem List   Diagnosis Date Noted  . Medial epicondylitis, left elbow 04/29/2017  . S/P arthroscopy of left shoulder 02/25/2017  . Impingement syndrome of left shoulder 01/30/2017  . Chronic pain of right knee 11/12/2016  . Pedal edema 10/26/2016  . Right thigh pain 09/12/2016  .  Trochanteric bursitis, right hip 04/10/2016  . Elevated BP without diagnosis of hypertension 09/02/2015  . Urge incontinence 07/27/2015  . Obesity 04/28/2015  . Need for hepatitis C screening test 04/26/2015  . Screening for HIV (human immunodeficiency virus) 04/26/2015  . Osteoarthritis of left knee 01/28/2015  . Status post total left knee replacement 01/28/2015  . Vitamin D deficiency 04/27/2014  . Seasonal and perennial allergic rhinitis 06/17/2013  . S/P total hip arthroplasty 03/20/2013  . 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 Encounter for Medicare  annual wellness exam 11/18/2012  . History of falling 11/18/2012  . Routine general medical examination at a health care facility 11/10/2012  . 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 Osteopenia 10/19/2011  . Other screening mammogram 10/19/2011  . Hypothyroid 10/19/2011  . ANEMIA 10/11/2009  . PERIPHERAL NEUROPATHY, FEET 08/05/2009  . BACK PAIN 08/05/2009  . HAND PAIN, BILATERAL 08/05/2009  . TREMOR 03/09/2008  . MENOPAUSAL SYNDROME 10/09/2007  . DEPRESSION, MAJOR 05/20/2007  . BIPOLAR AFFECTIVE DISORDER 05/20/2007  . ANXIETY 05/20/2007  . 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 ESOPHAGEAL SPASM 05/20/2007  . HIATAL HERNIA 05/20/2007  . AMAUROSIS FUGAX 05/07/2007  . COPD mixed type (HCC) 03/27/2007  . HYPERCHOLESTEROLEMIA, PURE 12/10/2006  . SYMPTOM, SYNDROME, CHRONIC FATIGUE 12/10/2006   Past Medical History:  Diagnosis Date  . Amaurosis fugax   . Anemia    hx  . Anxiety   . Arthritis   . Asthma   . Cataract   . Chronic fatigue syndrome   . Chronic kidney disease    frequency, nephritis when 67 yrs old  . COPD (chronic obstructive pulmonary disease) (HCC)   . Depression   . Diverticulitis   . Emphysema of lung (HCC)   . Fibromyalgia   . GERD (gastroesophageal reflux disease)    occ  . H/O hiatal hernia   . History of bronchitis   . Hyperlipidemia   . Hypothyroidism   . Interstitial cystitis   . Irritable bowel syndrome   . Lichen sclerosus   . Lumbar herniated disc   . Migraines   . Pneumonia   . PONV (postoperative nausea and vomiting)   . Shortness of breath    exertion   . Thyroid disease    Graves  . Urinary frequency   . Urinary tract infection   . Vertigo     Family History  Problem Relation Age of Onset  . Thyroid disease Mother   . Hypertension Mother   . Kidney disease Mother   . Cancer Father   . Colon cancer Other     Past Surgical History:  Procedure Laterality Date  . ABDOMINAL HYSTERECTOMY    . bladder sugery     .  CAROTID DOPPLAR    . COLONOSCOPY  05/26/2010   avms- otherwise nl , re check 10y  . DEXA-OSTEOPENIA    . DOPPLER ECHOCARDIOGRAPHY    . EPICONDYLITIS    . EYE SURGERY Bilateral    cataracts  . FOOT SURGERY Bilateral   . KNEE SURGERY     Left cartilage  . lipoma in second finger right hand    . PLANTAR FASCIA SURGERY Left   . TEMPOROMANDIBULAR JOINT SURGERY    . TONSILLECTOMY    . TOTAL HIP ARTHROPLASTY Right 03/20/2013   Procedure: Right TOTAL HIP ARTHROPLASTY;  Surgeon: [name], MD;  Location: [facility];  Service: Orthopedics;  Laterality: Right;  Right Total Hip Arthroplasty  . TOTAL  KNEE ARTHROPLASTY Left 01/28/2015   Procedure: LEFT TOTAL KNEE ARTHROPLASTY;  Surgeon: [name], MD;  Location: [facility];  Service: Orthopedics;  Laterality: Left;  . TRIGGER FINGER RELEASE Right   . TROCHANTERIC BURSA EXCISION Right 05/03/2016   Dr. [name], [name]  . WRIST ARTHROSCOPY Right    ligament tear   Social History   Occupational History  . Not on file  Tobacco Use  . Smoking status: Former Smoker    Packs/day: 2.00    Years: 43.00    Pack years: 86.00    Types: Cigarettes    Last attempt to quit: 02/26/2013    Years since quitting: 4.2  . Smokeless tobacco: Never Used  . Tobacco comment: "vaping now"  Substance and Sexual Activity  . Alcohol use: No    Alcohol/week: 0.0 oz  . Drug use: No  . Sexual activity: Not Currently

## 2017-06-13 ENCOUNTER — Ambulatory Visit (INDEPENDENT_AMBULATORY_CARE_PROVIDER_SITE_OTHER): Payer: Medicare Other

## 2017-06-13 DIAGNOSIS — Z1239 Encounter for other screening for malignant neoplasm of breast: Secondary | ICD-10-CM

## 2017-06-13 DIAGNOSIS — Z1231 Encounter for screening mammogram for malignant neoplasm of breast: Secondary | ICD-10-CM

## 2017-06-17 ENCOUNTER — Other Ambulatory Visit: Payer: Self-pay | Admitting: Family Medicine

## 2017-06-18 ENCOUNTER — Ambulatory Visit (INDEPENDENT_AMBULATORY_CARE_PROVIDER_SITE_OTHER): Payer: Medicare Other

## 2017-06-18 VITALS — BP 122/70 | HR 68 | Temp 98.6°F | Ht 66.5 in | Wt 207.5 lb

## 2017-06-18 DIAGNOSIS — E559 Vitamin D deficiency, unspecified: Secondary | ICD-10-CM

## 2017-06-18 DIAGNOSIS — Z0000 Encounter for general adult medical examination without abnormal findings: Secondary | ICD-10-CM

## 2017-06-18 DIAGNOSIS — E7800 Pure hypercholesterolemia, unspecified: Secondary | ICD-10-CM

## 2017-06-18 DIAGNOSIS — E039 Hypothyroidism, unspecified: Secondary | ICD-10-CM

## 2017-06-18 LAB — CBC WITH DIFFERENTIAL/PLATELET
Basophils Absolute: 0 K/uL (ref 0.0–0.1)
Basophils Relative: 0.6 % (ref 0.0–3.0)
Eosinophils Absolute: 0.1 K/uL (ref 0.0–0.7)
Eosinophils Relative: 0.8 % (ref 0.0–5.0)
HCT: 38 % (ref 36.0–46.0)
Hemoglobin: 12.6 g/dL (ref 12.0–15.0)
Lymphocytes Relative: 43 % (ref 12.0–46.0)
Lymphs Abs: 3.1 K/uL (ref 0.7–4.0)
MCHC: 33.2 g/dL (ref 30.0–36.0)
MCV: 93.4 fl (ref 78.0–100.0)
Monocytes Absolute: 0.3 K/uL (ref 0.1–1.0)
Monocytes Relative: 4.6 % (ref 3.0–12.0)
Neutro Abs: 3.7 K/uL (ref 1.4–7.7)
Neutrophils Relative %: 51 % (ref 43.0–77.0)
Platelets: 264 K/uL (ref 150.0–400.0)
RBC: 4.07 Mil/uL (ref 3.87–5.11)
RDW: 15.1 % (ref 11.5–15.5)
WBC: 7.3 K/uL (ref 4.0–10.5)

## 2017-06-18 LAB — COMPREHENSIVE METABOLIC PANEL WITH GFR
ALT: 11 U/L (ref 0–35)
AST: 14 U/L (ref 0–37)
Albumin: 4 g/dL (ref 3.5–5.2)
Alkaline Phosphatase: 72 U/L (ref 39–117)
BUN: 22 mg/dL (ref 6–23)
CO2: 30 meq/L (ref 19–32)
Calcium: 9.5 mg/dL (ref 8.4–10.5)
Chloride: 104 meq/L (ref 96–112)
Creatinine, Ser: 1 mg/dL (ref 0.40–1.20)
GFR: 58.78 mL/min — ABNORMAL LOW
Glucose, Bld: 93 mg/dL (ref 70–99)
Potassium: 4.6 meq/L (ref 3.5–5.1)
Sodium: 140 meq/L (ref 135–145)
Total Bilirubin: 0.4 mg/dL (ref 0.2–1.2)
Total Protein: 6.9 g/dL (ref 6.0–8.3)

## 2017-06-18 LAB — LIPID PANEL
Cholesterol: 182 mg/dL (ref 0–200)
HDL: 77.8 mg/dL
LDL Cholesterol: 93 mg/dL (ref 0–99)
NonHDL: 104.05
Total CHOL/HDL Ratio: 2
Triglycerides: 55 mg/dL (ref 0.0–149.0)
VLDL: 11 mg/dL (ref 0.0–40.0)

## 2017-06-18 LAB — VITAMIN D 25 HYDROXY (VIT D DEFICIENCY, FRACTURES): VITD: 50.2 ng/mL (ref 30.00–100.00)

## 2017-06-18 LAB — TSH: TSH: 2.46 u[IU]/mL (ref 0.35–4.50)

## 2017-06-18 NOTE — Progress Notes (Signed)
PCP notes:   Health maintenance:  PNA vaccine - postponed until CPE; pt travelling  Abnormal screenings:   Hearing - failed  Hearing Screening   125Hz  250Hz  500Hz  1000Hz  2000Hz  3000Hz  4000Hz  6000Hz  8000Hz   Right ear:   40 40 40  0    Left ear:   40 40 40  0     Mini-Cog score: 18/20 MMSE - Mini Mental State Exam 06/18/2017 05/09/2016  Orientation to time 5 5  Orientation to Place 5 5  Registration 3 3  Attention/ Calculation 0 0  Recall 1 3  Recall-comments unable to recall 2 of 3 words -  Language- name 2 objects 0 0  Language- repeat 1 1  Language- follow 3 step command 3 3  Language- read & follow direction 0 0  Write a sentence 0 0  Copy design 0 0  Total score 18 20     Patient concerns:   None  Nurse concerns:  None  Next PCP appt:   06/28/17 @ 0930  I reviewed health advisor's note, was available for consultation, and agree with documentation and plan. MD

## 2017-06-18 NOTE — Progress Notes (Signed)
Subjective:   Lori Orozco is a 67 y.o. female who presents for Medicare Annual (Subsequent) preventive examination.  Review of Systems:  N/A Cardiac Risk Factors include: advanced age (>1men, >16 women);obesity (BMI >30kg/m2);dyslipidemia     Objective:     Vitals: BP 122/70 (BP Location: Left Arm, Patient Position: Sitting, Cuff Size: Normal)   Pulse 68   Temp 98.6 F (37 C) (Oral)   Ht 5' 6.5" (1.689 m) Comment: no shoes  Wt 207 lb 8 oz (94.1 kg)   SpO2 95%   BMI 32.99 kg/m   Body mass index is 32.99 kg/m.  Advanced Directives 06/18/2017 05/09/2016 01/28/2015 01/19/2015 03/20/2013 03/16/2013  Does Patient Have a Medical Advance Directive? Yes Yes Yes Yes Patient has advance directive, copy not in chart Patient has advance directive, copy not in chart  Type of Advance Directive Living will Healthcare Power of [place];Living will Living will Living will Living will Living will  Does patient want to make changes to medical advance directive? - - No - Patient declined No - Patient declined No No  Copy of Healthcare Power of Attorney in Chart? - No - copy requested No - copy requested No - copy requested Copy requested from family Copy requested from family  Pre-existing out of facility DNR order (yellow form or pink MOST form) - - - - No -    Tobacco Social History   Tobacco Use  Smoking Status Former Smoker  . Packs/day: 2.00  . Years: 43.00  . Pack years: 86.00  . Types: Cigarettes  . Last attempt to quit: 02/26/2013  . Years since quitting: 4.3  Smokeless Tobacco Never Used  Tobacco Comment   "vaping now"     Counseling given: No Comment: "vaping now"   Clinical Intake:  Pre-visit preparation completed: Yes  Pain : No/denies pain Pain Score: 4      Nutritional Status: BMI > 30  Obese Nutritional Risks: None Diabetes: No  How often do you need to have someone help you when you read instructions, pamphlets, or other written materials from your  doctor or pharmacy?: 1 - Never What is the last grade level you completed in school?: 12th grade  Interpreter Needed?: No  Comments: pt lives with a roommate Information entered by :: LPinson, LPN  Past Medical History:  Diagnosis Date  . Amaurosis fugax   . Anemia    hx  . Anxiety   . Arthritis   . Asthma   . Cataract   . Chronic fatigue syndrome   . Chronic kidney disease    frequency, nephritis when 67 yrs old  . COPD (chronic obstructive pulmonary disease) (HCC)   . Depression   . Diverticulitis   . Emphysema of lung (HCC)   . Fibromyalgia   . GERD (gastroesophageal reflux disease)    occ  . H/O hiatal hernia   . History of bronchitis   . Hyperlipidemia   . Hypothyroidism   . Interstitial cystitis   . Irritable bowel syndrome   . Lichen sclerosus   . Lumbar herniated disc   . Migraines   . Pneumonia   . PONV (postoperative nausea and vomiting)   . Shortness of breath    exertion   . Thyroid disease    [place]  . Urinary frequency   . Urinary tract infection   . Vertigo    Past Surgical History:  Procedure Laterality Date  . ABDOMINAL HYSTERECTOMY    . bladder sugery     .  CAROTID DOPPLAR    . COLONOSCOPY  05/26/2010   avms- otherwise nl , re check 10y  . DEXA-OSTEOPENIA    . DOPPLER ECHOCARDIOGRAPHY    . EPICONDYLITIS    . EYE SURGERY Bilateral    cataracts  . FOOT SURGERY Bilateral   . KNEE SURGERY     Left cartilage  . lipoma in second finger right hand    . PLANTAR FASCIA SURGERY Left   . TEMPOROMANDIBULAR JOINT SURGERY    . TONSILLECTOMY    . TOTAL HIP ARTHROPLASTY Right 03/20/2013   Procedure: Right TOTAL HIP ARTHROPLASTY;  Surgeon: [name], MD;  Location: [facility] OR;  Service: Orthopedics;  Laterality: Right;  Right Total Hip Arthroplasty  . TOTAL KNEE ARTHROPLASTY Left 01/28/2015   Procedure: LEFT TOTAL KNEE ARTHROPLASTY;  Surgeon: [name], MD;  Location: [facility];  Service: Orthopedics;  Laterality: Left;  . TRIGGER FINGER  RELEASE Right   . TROCHANTERIC BURSA EXCISION Right 05/03/2016   Dr. [name], [name]  . WRIST ARTHROSCOPY Right    ligament tear   Family History  Problem Relation Age of Onset  . Thyroid disease Mother   . Hypertension Mother   . Kidney disease Mother   . Cancer Father   . Colon cancer Other    Social History   Socioeconomic History  . Marital status: Single    Spouse name: Not on file  . Number of children: Not on file  . Years of education: Not on file  . Highest education level: Not on file  Occupational History  . Not on file  Social Needs  . Financial resource strain: Not on file  . Food insecurity:    Worry: Not on file    Inability: Not on file  . Transportation needs:    Medical: Not on file    Non-medical: Not on file  Tobacco Use  . Smoking status: Former Smoker    Packs/day: 2.00    Years: 43.00    Pack years: 86.00    Types: Cigarettes    Last attempt to quit: 02/26/2013    Years since quitting: 4.3  . Smokeless tobacco: Never Used  . Tobacco comment: "vaping now"  Substance and Sexual Activity  . Alcohol use: No    Alcohol/week: 0.0 oz  . Drug use: No  . Sexual activity: Not Currently  Lifestyle  . Physical activity:    Days per week: Not on file    Minutes per session: Not on file  . Stress: Not on file  Relationships  . Social connections:    Talks on phone: Not on file    Gets together: Not on file    Attends religious service: Not on file    Active member of club or organization: Not on file    Attends meetings of clubs or organizations: Not on file    Relationship status: Not on file  Other Topics Concern  . Not on file  Social History Narrative  . Not on file    Outpatient Encounter Medications as of 06/18/2017  Medication Sig  . albuterol (PROAIR HFA) 108 (90 Base) MCG/ACT inhaler INHALE 2 PUFFS INTO THE LUNGS EVERY 4 (FOUR) HOURS AS NEEDED FOR WHEEZING.  . baclofen (LIORESAL) 10 MG tablet TAKE 1 TABLET BY MOUTH EVERY 8  HOURS AS NEEDED FOR MUSCLE SPASMS  . buPROPion (WELLBUTRIN XL) 150 MG 24 hr tablet Take 150 mg by mouth daily.    . busPIRone (BUSPAR) 15 MG tablet Take  15 mg by mouth 2 (two) times daily before a meal.   . Cholecalciferol (VITAMIN D3) 2000 UNITS TABS Take 2,000 Units by mouth daily.  . clobetasol cream (TEMOVATE) 0.05 % APPLY SMALL AMOUNT TO AFFECTED AREAS 2 TO 3 TIMES PER WEEK  . cyanocobalamin 2000 MCG tablet Take 2,000 mcg by mouth daily.  . fluticasone (FLONASE) 50 MCG/ACT nasal spray PLACE 2 SPRAYS INTO THE NOSE DAILY.  Marland Kitchen HYDROcodone-acetaminophen (NORCO/VICODIN) 5-325 MG tablet Take 1 tablet by mouth 2 (two) times daily as needed for moderate pain.  Marland Kitchen levothyroxine (SYNTHROID, LEVOTHROID) 50 MCG tablet TAKE 1 TABLET (50 MCG TOTAL) BY MOUTH DAILY.  Marland Kitchen LORazepam (ATIVAN) 1 MG tablet Take 1 mg by mouth 2 (two) times daily as needed for anxiety.  . mirabegron ER (MYRBETRIQ) 25 MG TB24 tablet Take 1 tablet (25 mg total) by mouth daily.  . mirtazapine (REMERON) 30 MG tablet Take 30 mg by mouth at bedtime.   Marland Kitchen PREMARIN 0.625 MG tablet Take 1 tablet (0.625 mg total) by mouth daily.  . sertraline (ZOLOFT) 100 MG tablet Take 100 mg by mouth daily.   . simvastatin (ZOCOR) 20 MG tablet Take 1 tablet (20 mg total) by mouth at bedtime.  . [DISCONTINUED] aspirin 81 MG chewable tablet Chew 81 mg by mouth at bedtime.   . [DISCONTINUED] budesonide-formoterol (SYMBICORT) 80-4.5 MCG/ACT inhaler Inhale 2 puffs into the lungs 2 (two) times daily.  . [DISCONTINUED] divalproex (DEPAKOTE ER) 250 MG 24 hr tablet   . [DISCONTINUED] furosemide (LASIX) 20 MG tablet Take 0.5-1 tablets (10-20 mg total) by mouth daily as needed. For swelling when traveling  . [DISCONTINUED] scopolamine (TRANSDERM-SCOP, 1.5 MG,) 1 MG/3DAYS Place 1 patch (1.5 mg total) onto the skin every 3 (three) days.   No facility-administered encounter medications on file as of 06/18/2017.     Activities of Daily Living In your present state of  health, do you have any difficulty performing the following activities: 06/18/2017  Hearing? N  Vision? N  Difficulty concentrating or making decisions? N  Walking or climbing stairs? N  Dressing or bathing? N  Doing errands, shopping? N  Preparing Food and eating ? N  Using the Toilet? N  In the past six months, have you accidently leaked urine? N  Do you have problems with loss of bowel control? N  Managing your Medications? N  Managing your Finances? N  Housekeeping or managing your Housekeeping? N  Some recent data might be hidden    Patient Care Team: Tower, Audrie Gallus, MD as PCP - General Mckinley Jewel, MD as Consulting Physician (Ophthalmology) Phillip Heal, MD as Referring Physician (Psychiatry) Allie Bossier, MD as Consulting Physician (Obstetrics and Gynecology) Waymon Budge, MD as Consulting Physician (Pulmonary Disease) [name], MD as Consulting Physician (Orthopedic Surgery) [name], MD as Consulting Physician (Orthopedic Surgery) Tyrell Antonio, MD as Consulting Physician (Physical Medicine and Rehabilitation) Ronalee Belts, MD as Referring Physician (Otolaryngology) Doran Heater, PhD as Referring Physician (Psychology)    Assessment:   This is a routine wellness examination for [place].   Hearing Screening   125Hz  250Hz  500Hz  1000Hz  2000Hz  3000Hz  4000Hz  6000Hz  8000Hz   Right ear:   40 40 40  0    Left ear:   40 40 40  0    Vision Screening Comments: Last vision exam in Sept 2018 with Dr.   Exercise Activities and Dietary recommendations Current Exercise Habits: The patient does not participate in regular exercise at present, Exercise  limited by: None identified  Goals    . Follow up with Primary Care Provider     Starting 06/18/2017, I will continue to take medications as prescribed and to keep appointments with PCP as scheduled.        Fall Risk Fall Risk  06/18/2017 05/09/2016 04/26/2015 04/23/2014 11/18/2012    Falls in the past year? No Yes No No Yes  Comment - pt fell backwards over stacked bricks that were in yard; bruise to right hip - - -  Number falls in past yr: - 1 - - 2 or more  Injury with Fall? - Yes - - -  Risk Factor Category  - - - - High Fall Risk  Comment - - - - 4 falls with in the last 3 months   Depression Screen PHQ 2/9 Scores 06/18/2017 05/09/2016 04/26/2015 04/23/2014  PHQ - 2 Score 0 6 0 1  PHQ- 9 Score 0 21 - -     Cognitive Function MMSE - Mini Mental State Exam 06/18/2017 05/09/2016  Orientation to time 5 5  Orientation to Place 5 5  Registration 3 3  Attention/ Calculation 0 0  Recall 1 3  Recall-comments unable to recall 2 of 3 words -  Language- name 2 objects 0 0  Language- repeat 1 1  Language- follow 3 step command 3 3  Language- read & follow direction 0 0  Write a sentence 0 0  Copy design 0 0  Total score 18 20     PLEASE NOTE: A Mini-Cog screen was completed. Maximum score is 20. A value of 0 denotes this part of [place] MMSE was not completed or the patient failed this part of the Mini-Cog screening.   Mini-Cog Screening Orientation to Time - Max 5 pts Orientation to Place - Max 5 pts Registration - Max 3 pts Recall - Max 3 pts Language Repeat - Max 1 pts Language Follow 3 Step Command - Max 3 pts     Immunization History  Administered Date(s) Administered  . Influenza Split 11/19/2011, 11/02/2013  . Influenza Whole 12/20/2006, 11/25/2008, 11/26/2009, 11/24/2010  . Influenza, High Dose Seasonal PF 12/13/2016  . Influenza,inj,Quad PF,6+ Mos 12/06/2015  . Influenza-Unspecified 12/08/2012, 12/03/2014  . Pneumococcal Conjugate-13 12/06/2015  . Pneumococcal Polysaccharide-23 01/26/2002, 03/23/2008, 09/07/2010  . Td 06/26/2000, 10/19/2011    Screening Tests Health Maintenance  Topic Date Due  . PNA vac Low Risk Adult (2 of 2 - PPSV23) 06/30/2017 (Originally 12/05/2016)  . INFLUENZA VACCINE  09/26/2017  . MAMMOGRAM  06/14/2018  .  COLONOSCOPY  05/25/2020  . TETANUS/TDAP  10/18/2021  . DEXA SCAN  Completed  . Hepatitis C Screening  Completed      Plan:     I have personally reviewed, addressed, and noted the following in the patient's chart:  A. Medical and social history B. Use of alcohol, tobacco or illicit drugs  C. Current medications and supplements D. Functional ability and status E.  Nutritional status F.  Physical activity G. Advance directives H. List of other physicians I.  Hospitalizations, surgeries, and ER visits in previous 12 months J.  Vitals K. Screenings to include hearing, vision, cognitive, depression L. Referrals and appointments - none  In addition, I have reviewed and discussed with patient certain preventive protocols, quality metrics, and best practice recommendations. A written personalized care plan for preventive services as well as general preventive health recommendations were provided to patient.  See attached scanned questionnaire for additional information.   Signed,  Lindell Noe, MHA, BS, LPN Health Coach

## 2017-06-18 NOTE — Patient Instructions (Addendum)
Ms. Sieg , Thank you for taking time to come for your Medicare Wellness Visit. I appreciate your ongoing commitment to your health goals. Please review the following plan we discussed and let me know if I can assist you in the future.   These are the goals we discussed: Goals    . Follow up with Primary Care Provider     Starting 06/18/2017, I will continue to take medications as prescribed and to keep appointments with PCP as scheduled.        This is a list of the screening recommended for you and due dates:  Health Maintenance  Topic Date Due  . Pneumonia vaccines (2 of 2 - PPSV23) 06/30/2017*  . Flu Shot  09/26/2017  . Mammogram  06/14/2018  . Colon Cancer Screening  05/25/2020  . Tetanus Vaccine  10/18/2021  . DEXA scan (bone density measurement)  Completed  .  Hepatitis C: One time screening is recommended by Center for Disease Control  (CDC) for  adults born from [birth year] through 1965.   Completed  *Topic was postponed. The date shown is not the original due date.   Preventive Care for Adults  A healthy lifestyle and preventive care can promote health and wellness. Preventive health guidelines for adults include the following key practices.  . A routine yearly physical is a good way to check with your health care provider about your health and preventive screening. It is a chance to share any concerns and updates on your health and to receive a thorough exam.  . Visit your dentist for a routine exam and preventive care every 6 months. Brush your teeth twice a day and floss once a day. Good oral hygiene prevents tooth decay and gum disease.  . The frequency of eye exams is based on your age, health, family medical history, use  of contact lenses, and other factors. Follow your health care provider's recommendations for frequency of eye exams.  . Eat a healthy diet. Foods like vegetables, fruits, whole grains, low-fat dairy products, and lean protein foods contain the  nutrients you need without too many calories. Decrease your intake of foods high in solid fats, added sugars, and salt. Eat the right amount of calories for you. Get information about a proper diet from your health care provider, if necessary.  . Regular physical exercise is one of the most important things you can do for your health. Most adults should get at least 150 minutes of moderate-intensity exercise (any activity that increases your heart rate and causes you to sweat) each week. In addition, most adults need muscle-strengthening exercises on 2 or more days a week.  Silver Sneakers may be a benefit available to you. To determine eligibility, you may visit the website: www.silversneakers.com or contact program at 307-350-4618 Mon-Fri between 8AM-8PM.   . Maintain a healthy weight. The body mass index (BMI) is a screening tool to identify possible weight problems. It provides an estimate of body fat based on height and weight. Your health care provider can find your BMI and can help you achieve or maintain a healthy weight.   For adults 20 years and older: ? A BMI below 18.5 is considered underweight. ? A BMI of 18.5 to 24.9 is normal. ? A BMI of 25 to 29.9 is considered overweight. ? A BMI of 30 and above is considered obese.   . Maintain normal blood lipids and cholesterol levels by exercising and minimizing your intake of saturated fat. Eat a balanced  diet with plenty of fruit and vegetables. Blood tests for lipids and cholesterol should begin at age 47 and be repeated every 5 years. If your lipid or cholesterol levels are high, you are over 50, or you are at high risk for heart disease, you may need your cholesterol levels checked more frequently. Ongoing high lipid and cholesterol levels should be treated with medicines if diet and exercise are not working.  . If you smoke, find out from your health care provider how to quit. If you do not use tobacco, please do not start.  . If you  choose to drink alcohol, please do not consume more than 2 drinks per day. One drink is considered to be 12 ounces (355 mL) of beer, 5 ounces (148 mL) of wine, or 1.5 ounces (44 mL) of liquor.  . If you are 10-27 years old, ask your health care provider if you should take aspirin to prevent strokes.  . Use sunscreen. Apply sunscreen liberally and repeatedly throughout the day. You should seek shade when your shadow is shorter than you. Protect yourself by wearing long sleeves, pants, a wide-brimmed hat, and sunglasses year round, whenever you are outdoors.  . Once a month, do a whole body skin exam, using a mirror to look at the skin on your back. Tell your health care provider of new moles, moles that have irregular borders, moles that are larger than a pencil eraser, or moles that have changed in shape or color.

## 2017-06-21 ENCOUNTER — Ambulatory Visit: Payer: Medicare Other

## 2017-06-28 ENCOUNTER — Encounter: Payer: Self-pay | Admitting: Family Medicine

## 2017-06-28 ENCOUNTER — Ambulatory Visit (INDEPENDENT_AMBULATORY_CARE_PROVIDER_SITE_OTHER): Payer: Medicare Other | Admitting: Family Medicine

## 2017-06-28 VITALS — BP 132/80 | HR 87 | Temp 98.3°F | Ht 66.5 in | Wt 209.2 lb

## 2017-06-28 DIAGNOSIS — E6609 Other obesity due to excess calories: Secondary | ICD-10-CM | POA: Diagnosis not present

## 2017-06-28 DIAGNOSIS — E559 Vitamin D deficiency, unspecified: Secondary | ICD-10-CM | POA: Diagnosis not present

## 2017-06-28 DIAGNOSIS — Z23 Encounter for immunization: Secondary | ICD-10-CM

## 2017-06-28 DIAGNOSIS — Z6833 Body mass index (BMI) 33.0-33.9, adult: Secondary | ICD-10-CM

## 2017-06-28 DIAGNOSIS — M8580 Other specified disorders of bone density and structure, unspecified site: Secondary | ICD-10-CM

## 2017-06-28 DIAGNOSIS — F319 Bipolar disorder, unspecified: Secondary | ICD-10-CM | POA: Diagnosis not present

## 2017-06-28 DIAGNOSIS — E7800 Pure hypercholesterolemia, unspecified: Secondary | ICD-10-CM

## 2017-06-28 DIAGNOSIS — E2839 Other primary ovarian failure: Secondary | ICD-10-CM

## 2017-06-28 DIAGNOSIS — J449 Chronic obstructive pulmonary disease, unspecified: Secondary | ICD-10-CM

## 2017-06-28 DIAGNOSIS — E039 Hypothyroidism, unspecified: Secondary | ICD-10-CM

## 2017-06-28 MED ORDER — MIRABEGRON ER 25 MG PO TB24: 25.0000 mg | ORAL_TABLET | Freq: Every day | ORAL | 11 refills | Status: DC

## 2017-06-28 MED ORDER — LEVOTHYROXINE SODIUM 50 MCG PO TABS: ORAL_TABLET | ORAL | 11 refills | Status: DC

## 2017-06-28 MED ORDER — SIMVASTATIN 20 MG PO TABS: 20.0000 mg | ORAL_TABLET | Freq: Every day | ORAL | 11 refills | Status: DC

## 2017-06-28 NOTE — Patient Instructions (Addendum)
For weight loss and better health  Try to get most of your carbohydrates from produce (with the exception of white potatoes)  Eat less bread/pasta/rice/snack foods/cereals/sweets and other items from the middle of the grocery store (processed carbs)   We will refer for dexa   If you are interested in the new shingles vaccine (Shingrix) - call your local pharmacy to check on coverage and availability  If you can afford it please get on a waiting list at your pharmacy    Pneumonia vaccine today

## 2017-06-28 NOTE — Progress Notes (Signed)
Subjective:    Patient ID: Lori Orozco, female    DOB: [birth year], 67 y.o.   MRN: [id number]  HPI Here for annual f/u of chronic health problems   Lot of orthopedic problems and surgeries Doing ok overall  Just had shoulder surgery/ inj and PT -- some improvement now  Also elbow problems  Troch bursitis-still getting shots for that  So frustrating  Is hard to exercise   Wt Readings from Last 3 Encounters:  06/28/17 209 lb 4 oz (94.9 kg)  06/18/17 207 lb 8 oz (94.1 kg)  12/13/16 212 lb 6.4 oz (96.3 kg)  fairly stable   33.27 kg/m   Had amw on 4/23 Missed highest tone both ears for hearing screen -is going to get another hearing test at [facility] in the fall  Mini cog-unable to recall 2 of 3 words (suspect due to her psychiatric medicines)  No problems with cognition at home /no red flags    Mammogram 4/19 neg Self breast exam no breast exam   Colonoscopy 3/12 -routine risk recall    dexa 9/13 nl range  Vit D level 50.2- very good  No falls or fractures (is careful)  Is interested in dexa    Gyn visit -still sees Dr [name]  Has lichen sclerosis, doing well and goes back in June  Had a hysterectomy   Zoster status -- is interested in shingrix   Hypothyroidism  Pt has no clinical changes No change in energy level/ hair or skin/ edema and no tremor Lab Results  Component Value Date   TSH 2.46 06/18/2017     Blood pressure BP Readings from Last 3 Encounters:  06/28/17 (!) 148/78  06/18/17 122/70  12/13/16 (!) 158/80  this tends to be labile  2nd check BP: 132/80  Improved   Copd - doing fairly well  Still sob on exertion -no changes /about the same   Due for ppsv23 Will do that today    Hyperlipidemia Lab Results  Component Value Date   CHOL 182 06/18/2017   CHOL 205 (H) 05/09/2016   CHOL 199 04/26/2015   Lab Results  Component Value Date   HDL 77.80 06/18/2017   HDL 60.60 05/09/2016   HDL 64.00 04/26/2015   Lab Results  Component Value  Date   LDLCALC 93 06/18/2017   LDLCALC 114 (H) 05/09/2016   LDLCALC 108 (H) 04/26/2015   Lab Results  Component Value Date   TRIG 55.0 06/18/2017   TRIG 153.0 (H) 05/09/2016   TRIG 132.0 04/26/2015   Lab Results  Component Value Date   CHOLHDL 2 06/18/2017   CHOLHDL 3 05/09/2016   CHOLHDL 3 04/26/2015   No results found for: LDLDIRECT zocor and diet  Not eating differently but it did improve   Rest of labs Results for orders placed or performed in visit on 06/18/17  CBC with Differential/Platelet  Result Value Ref Range   WBC 7.3 4.0 - 10.5 K/uL   RBC 4.07 3.87 - 5.11 Mil/uL   Hemoglobin 12.6 12.0 - 15.0 g/dL   HCT 40.9 81.1 - 91.4 %   MCV 93.4 78.0 - 100.0 fl   MCHC 33.2 30.0 - 36.0 g/dL   RDW 78.2 95.6 - 21.3 %   Platelets 264.0 150.0 - 400.0 K/uL   Neutrophils Relative % 51.0 43.0 - 77.0 %   Lymphocytes Relative 43.0 12.0 - 46.0 %   Monocytes Relative 4.6 3.0 - 12.0 %   Eosinophils Relative 0.8 0.0 - 5.0 %  Basophils Relative 0.6 0.0 - 3.0 %   Neutro Abs 3.7 1.4 - 7.7 K/uL   Lymphs Abs 3.1 0.7 - 4.0 K/uL   Monocytes Absolute 0.3 0.1 - 1.0 K/uL   Eosinophils Absolute 0.1 0.0 - 0.7 K/uL   Basophils Absolute 0.0 0.0 - 0.1 K/uL  Comprehensive metabolic panel  Result Value Ref Range   Sodium 140 135 - 145 mEq/L   Potassium 4.6 3.5 - 5.1 mEq/L   Chloride 104 96 - 112 mEq/L   CO2 30 19 - 32 mEq/L   Glucose, Bld 93 70 - 99 mg/dL   BUN 22 6 - 23 mg/dL   Creatinine, Ser 1.61 0.40 - 1.20 mg/dL   Total Bilirubin 0.4 0.2 - 1.2 mg/dL   Alkaline Phosphatase 72 39 - 117 U/L   AST 14 0 - 37 U/L   ALT 11 0 - 35 U/L   Total Protein 6.9 6.0 - 8.3 g/dL   Albumin 4.0 3.5 - 5.2 g/dL   Calcium 9.5 8.4 - 09.6 mg/dL   GFR 04.54 (L) >09.81 mL/min  TSH  Result Value Ref Range   TSH 2.46 0.35 - 4.50 uIU/mL  Vitamin D, 25-hydroxy  Result Value Ref Range   VITD 50.20 30.00 - 100.00 ng/mL  Lipid Panel  Result Value Ref Range   Cholesterol 182 0 - 200 mg/dL   Triglycerides  19.1 0.0 - 149.0 mg/dL   HDL 47.82 >95.62 mg/dL   VLDL 13.0 0.0 - 86.5 mg/dL   LDL Cholesterol 93 0 - 99 mg/dL   Total CHOL/HDL Ratio 2    NonHDL 104.05      Patient Active Problem List   Diagnosis Date Noted  . Estrogen deficiency 06/28/2017  . Medial epicondylitis, left elbow 04/29/2017  . S/P arthroscopy of left shoulder 02/25/2017  . Impingement syndrome of left shoulder 01/30/2017  . Chronic pain of right knee 11/12/2016  . Pedal edema 10/26/2016  . Right thigh pain 09/12/2016  . Trochanteric bursitis, right hip 04/10/2016  . Elevated BP without diagnosis of hypertension 09/02/2015  . Urge incontinence 07/27/2015  . Obesity 04/28/2015  . Need for hepatitis C screening test 04/26/2015  . Screening for HIV (human immunodeficiency virus) 04/26/2015  . Osteoarthritis of left knee 01/28/2015  . Status post total left knee replacement 01/28/2015  . Vitamin D deficiency 04/27/2014  . Seasonal and perennial allergic rhinitis 06/17/2013  . S/P total hip arthroplasty 03/20/2013  . 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 Encounter for Medicare annual wellness exam 11/18/2012  . History of falling 11/18/2012  . Routine general medical examination at a health care facility 11/10/2012  . 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 Osteopenia 10/19/2011  . Other screening mammogram 10/19/2011  . Hypothyroid 10/19/2011  . ANEMIA 10/11/2009  . PERIPHERAL NEUROPATHY, FEET 08/05/2009  . BACK PAIN 08/05/2009  . HAND PAIN, BILATERAL 08/05/2009  . TREMOR 03/09/2008  . MENOPAUSAL SYNDROME 10/09/2007  . DEPRESSION, MAJOR 05/20/2007  . BIPOLAR AFFECTIVE DISORDER 05/20/2007  . ANXIETY 05/20/2007  . 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 ESOPHAGEAL SPASM 05/20/2007  . HIATAL HERNIA 05/20/2007  . AMAUROSIS FUGAX 05/07/2007  . COPD mixed type (HCC) 03/27/2007  . HYPERCHOLESTEROLEMIA, PURE 12/10/2006  . SYMPTOM, SYNDROME, CHRONIC FATIGUE 12/10/2006   Past  Medical History:  Diagnosis Date  . Amaurosis fugax   . Anemia    hx  . Anxiety   . Arthritis   . Asthma   . Cataract   .  Chronic fatigue syndrome   . Chronic kidney disease    frequency, nephritis when 67 yrs old  . COPD (chronic obstructive pulmonary disease) (HCC)   . Depression   . Diverticulitis   . Emphysema of lung (HCC)   . Fibromyalgia   . GERD (gastroesophageal reflux disease)    occ  . H/O hiatal hernia   . History of bronchitis   . Hyperlipidemia   . Hypothyroidism   . Interstitial cystitis   . Irritable bowel syndrome   . Lichen sclerosus   . Lumbar herniated disc   . Migraines   . Pneumonia   . PONV (postoperative nausea and vomiting)   . Shortness of breath    exertion   . Thyroid disease    [place]  . Urinary frequency   . Urinary tract infection   . Vertigo    Past Surgical History:  Procedure Laterality Date  . ABDOMINAL HYSTERECTOMY    . bladder sugery     . CAROTID DOPPLAR    . COLONOSCOPY  05/26/2010   avms- otherwise nl , re check 10y  . DEXA-OSTEOPENIA    . DOPPLER ECHOCARDIOGRAPHY    . EPICONDYLITIS    . EYE SURGERY Bilateral    cataracts  . FOOT SURGERY Bilateral   . KNEE SURGERY     Left cartilage  . lipoma in second finger right hand    . PLANTAR FASCIA SURGERY Left   . TEMPOROMANDIBULAR JOINT SURGERY    . TONSILLECTOMY    . TOTAL HIP ARTHROPLASTY Right 03/20/2013   Procedure: Right TOTAL HIP ARTHROPLASTY;  Surgeon: [name], MD;  Location: [facility] OR;  Service: Orthopedics;  Laterality: Right;  Right Total Hip Arthroplasty  . TOTAL KNEE ARTHROPLASTY Left 01/28/2015   Procedure: LEFT TOTAL KNEE ARTHROPLASTY;  Surgeon: [name], MD;  Location: [facility];  Service: Orthopedics;  Laterality: Left;  . TRIGGER FINGER RELEASE Right   . TROCHANTERIC BURSA EXCISION Right 05/03/2016   Dr. [name], [name]  . WRIST ARTHROSCOPY Right    ligament tear   Social History   Tobacco Use  . Smoking status: Former Smoker     Packs/day: 2.00    Years: 43.00    Pack years: 86.00    Types: Cigarettes    Last attempt to quit: 02/26/2013    Years since quitting: 4.3  . Smokeless tobacco: Never Used  . Tobacco comment: "vaping now"  Substance Use Topics  . Alcohol use: No    Alcohol/week: 0.0 oz  . Drug use: No   Family History  Problem Relation Age of Onset  . Thyroid disease Mother   . Hypertension Mother   . Kidney disease Mother   . Cancer Father   . Colon cancer Other    Allergies  Allergen Reactions  . Lithium Anaphylaxis  . Tegretol [Carbamazepine] Other (See Comments)    Fever and body aches (fever over 103)  . Tricyclic Antidepressants Anaphylaxis  . Codeine Nausea Only    Makes pt stay awake  . Cymbalta [Duloxetine Hcl] Other (See Comments)    Makes pt pass out   . Erythromycin     REACTION: abdominal pain  . Lyrica [Pregabalin]     Felt faint  . Neurontin [Gabapentin]     Passes  out  . Rabeprazole Sodium     REACTION: insomnia  . Duraprep [employer], Misc.] Itching and Rash    Tolerates Betadine   . Penicillins Rash    Has patient had  a PCN reaction causing immediate rash, facial/tongue/throat swelling, SOB or lightheadedness with hypotension: Yes Has patient had a PCN reaction causing severe rash involving mucus membranes or skin necrosis: No Has patient had a PCN reaction that required hospitalization No Has patient had a PCN reaction occurring within the last 10 years: No If all of the above answers are "NO", then may proceed with Cephalosporin use.   . Tape Rash    PT ALLERGIC NYLON TAPE    Current Outpatient Medications on File Prior to Visit  Medication Sig Dispense Refill  . albuterol (PROAIR HFA) 108 (90 Base) MCG/ACT inhaler INHALE 2 PUFFS INTO THE LUNGS EVERY 4 (FOUR) HOURS AS NEEDED FOR WHEEZING. 8.5 g 5  . baclofen (LIORESAL) 10 MG tablet TAKE 1 TABLET BY MOUTH EVERY 8 HOURS AS NEEDED FOR MUSCLE SPASMS 60 tablet 6  . buPROPion (WELLBUTRIN XL) 150 MG 24 hr  tablet Take 150 mg by mouth daily.      . busPIRone (BUSPAR) 15 MG tablet Take 15 mg by mouth 2 (two) times daily before a meal.     . Cholecalciferol (VITAMIN D3) 2000 UNITS TABS Take 2,000 Units by mouth daily.    . clobetasol cream (TEMOVATE) 0.05 % APPLY SMALL AMOUNT TO AFFECTED AREAS 2 TO 3 TIMES PER WEEK 15 g 6  . cyanocobalamin 2000 MCG tablet Take 2,000 mcg by mouth daily.    . fluticasone (FLONASE) 50 MCG/ACT nasal spray PLACE 2 SPRAYS INTO THE NOSE DAILY. 16 g 4  . HYDROcodone-acetaminophen (NORCO/VICODIN) 5-325 MG tablet Take 1 tablet by mouth 2 (two) times daily as needed for moderate pain. 60 tablet 0  . LORazepam (ATIVAN) 1 MG tablet Take 1 mg by mouth 2 (two) times daily as needed for anxiety.    . mirtazapine (REMERON) 30 MG tablet Take 30 mg by mouth at bedtime.     Marland Kitchen PREMARIN 0.625 MG tablet Take 1 tablet (0.625 mg total) by mouth daily. 30 tablet 11  . sertraline (ZOLOFT) 100 MG tablet Take 100 mg by mouth daily.      No current facility-administered medications on file prior to visit.      Review of Systems  Constitutional: Positive for fatigue. Negative for activity change, appetite change, fever and unexpected weight change.  HENT: Negative for congestion, ear pain, rhinorrhea, sinus pressure and sore throat.   Eyes: Negative for pain, redness and visual disturbance.  Respiratory: Negative for cough, shortness of breath and wheezing.   Cardiovascular: Negative for chest pain and palpitations.  Gastrointestinal: Negative for abdominal pain, blood in stool, constipation and diarrhea.  Endocrine: Negative for polydipsia and polyuria.  Genitourinary: Negative for dysuria, frequency and urgency.  Musculoskeletal: Positive for arthralgias. Negative for back pain and myalgias.  Skin: Negative for pallor and rash.  Allergic/Immunologic: Negative for environmental allergies.  Neurological: Negative for dizziness, syncope and headaches.  Hematological: Negative for adenopathy.  Does not bruise/bleed easily.  Psychiatric/Behavioral: Negative for decreased concentration and dysphoric mood. The patient is not nervous/anxious.        Objective:   Physical Exam  Constitutional: She appears well-developed and well-nourished. No distress.  obese and well appearing   HENT:  Head: Normocephalic and atraumatic.  Right Ear: External ear normal.  Left Ear: External ear normal.  Mouth/Throat: Oropharynx is clear and moist.  Eyes: Pupils are equal, round, and reactive to light. Conjunctivae and EOM are normal. No scleral icterus.  Neck: Normal range of motion. Neck supple. No JVD present. Carotid bruit is  not present. No thyromegaly present.  Cardiovascular: Normal rate, regular rhythm, normal heart sounds and intact distal pulses. Exam reveals no gallop.  Pulmonary/Chest: Effort normal and breath sounds normal. No respiratory distress. She has no wheezes. She exhibits no tenderness. No breast tenderness, discharge or bleeding.  Diffusely distant bs  Good air exch  Abdominal: Soft. Bowel sounds are normal. She exhibits no distension, no abdominal bruit and no mass. There is no tenderness.  Genitourinary: No breast tenderness, discharge or bleeding.  Genitourinary Comments:      Musculoskeletal: Normal range of motion. She exhibits no edema or tenderness.  Lymphadenopathy:    She has no cervical adenopathy.  Neurological: She is alert. She has normal reflexes. No cranial nerve deficit. She exhibits normal muscle tone. Coordination normal.  Skin: Skin is warm and dry. No rash noted. No erythema. No pallor.  Psychiatric: She has a normal mood and affect.          Assessment & Plan:   Problem List Items Addressed This Visit      Respiratory   COPD mixed type (HCC)    Doing well under care of pulmonary  Still not smoking  No clinical changes         Endocrine   Hypothyroid - Primary    Hypothyroidism  Pt has no clinical changes No change in energy level/  hair or skin/ edema and no tremor Lab Results  Component Value Date   TSH 2.46 06/18/2017          Relevant Medications   levothyroxine (SYNTHROID, LEVOTHROID) 50 MCG tablet     Musculoskeletal and Integument   Osteopenia    Due for dexa Ordered one   Disc need for calcium/ vitamin D/ wt bearing exercise and bone density test every 2 y to monitor Disc safety/ fracture risk in detail   No longer smoking         Other   BIPOLAR AFFECTIVE DISORDER    Stable under psychiatric care       Estrogen deficiency    Ref for dexa       Relevant Orders   DG Bone Density   HYPERCHOLESTEROLEMIA, PURE    Disc goals for lipids and reasons to control them Rev labs with pt Rev low sat fat diet in detail Continue simvastatin        Relevant Medications   simvastatin (ZOCOR) 20 MG tablet   Obesity    Discussed how this problem influences overall health and the risks it imposes  Reviewed plan for weight loss with lower calorie diet (via better food choices and also portion control or program like weight watchers) and exercise building up to or more than 30 minutes 5 days per week including some aerobic activity         Vitamin D deficiency    Vitamin D level is therapeutic with current supplementation Disc importance of this to bone and overall health Level of 50       Other Visit Diagnoses    Need for 23-polyvalent pneumococcal polysaccharide vaccine       Relevant Orders   Pneumococcal polysaccharide vaccine 23-valent greater than or equal to 2yo subcutaneous/IM (Completed)

## 2017-06-30 NOTE — Assessment & Plan Note (Signed)
Doing well under care of pulmonary  Still not smoking  No clinical changes

## 2017-06-30 NOTE — Assessment & Plan Note (Signed)
Due for dexa Ordered one   Disc need for calcium/ vitamin D/ wt bearing exercise and bone density test every 2 y to monitor Disc safety/ fracture risk in detail   No longer smoking

## 2017-06-30 NOTE — Assessment & Plan Note (Signed)
Discussed how this problem influences overall health and the risks it imposes  Reviewed plan for weight loss with lower calorie diet (via better food choices and also portion control or program like weight watchers) and exercise building up to or more than 30 minutes 5 days per week including some aerobic activity    

## 2017-06-30 NOTE — Assessment & Plan Note (Signed)
Vitamin D level is therapeutic with current supplementation ?Disc importance of this to bone and overall health ?Level of 50  ?

## 2017-06-30 NOTE — Assessment & Plan Note (Signed)
Ref for dexa 

## 2017-06-30 NOTE — Assessment & Plan Note (Signed)
Hypothyroidism  Pt has no clinical changes No change in energy level/ hair or skin/ edema and no tremor Lab Results  Component Value Date   TSH 2.46 06/18/2017

## 2017-06-30 NOTE — Assessment & Plan Note (Signed)
Stable under psychiatric care 

## 2017-06-30 NOTE — Assessment & Plan Note (Signed)
Disc goals for lipids and reasons to control them Rev labs with pt Rev low sat fat diet in detail Continue simvastatin  

## 2017-07-03 ENCOUNTER — Ambulatory Visit (INDEPENDENT_AMBULATORY_CARE_PROVIDER_SITE_OTHER): Payer: Medicare Other

## 2017-07-03 DIAGNOSIS — Z13820 Encounter for screening for osteoporosis: Secondary | ICD-10-CM

## 2017-07-03 DIAGNOSIS — E2839 Other primary ovarian failure: Secondary | ICD-10-CM

## 2017-07-25 ENCOUNTER — Encounter: Payer: Self-pay | Admitting: Family Medicine

## 2017-07-25 MED ORDER — ZOSTER VAC RECOMB ADJUVANTED 50 MCG/0.5ML IM SUSR: 0.5000 mL | Freq: Once | INTRAMUSCULAR | 1 refills | Status: AC

## 2017-07-25 NOTE — Telephone Encounter (Signed)
Sent shingrix px to pharmacy

## 2017-08-13 ENCOUNTER — Encounter: Payer: Self-pay | Admitting: Obstetrics & Gynecology

## 2017-08-13 ENCOUNTER — Ambulatory Visit (INDEPENDENT_AMBULATORY_CARE_PROVIDER_SITE_OTHER): Payer: Medicare Other | Admitting: Obstetrics & Gynecology

## 2017-08-13 VITALS — BP 156/88 | HR 99 | Resp 16 | Ht 67.0 in | Wt 205.0 lb

## 2017-08-13 DIAGNOSIS — N904 Leukoplakia of vulva: Secondary | ICD-10-CM

## 2017-08-13 DIAGNOSIS — Z79890 Hormone replacement therapy (postmenopausal): Secondary | ICD-10-CM

## 2017-08-13 DIAGNOSIS — R232 Flushing: Secondary | ICD-10-CM

## 2017-08-13 MED ORDER — PREMARIN 0.625 MG PO TABS: 0.6250 mg | ORAL_TABLET | Freq: Every day | ORAL | 11 refills | Status: DC

## 2017-08-13 MED ORDER — CLOBETASOL PROPIONATE 0.05 % EX CREA: TOPICAL_CREAM | CUTANEOUS | 6 refills | Status: DC

## 2017-08-13 NOTE — Progress Notes (Signed)
   Subjective:    Patient ID: Lori Orozco, female    DOB: 25-Jul-1950, 67 y.o.   MRN: [id number]  HPI  67 yo lady P0 here for a vulva check, done yearly, for her stable lichen sclerosis. She is having no problems today.  Review of Systems She has a long term girlfriend of 18 years.    Objective:   Physical Exam Breathing, conversing, and ambulating normally Well nourished, well hydrated White female, no apparent distress Vulva with atrophy but no evidence of vulvar cancer     Assessment & Plan:  Refills on premarin and clobetasol Come back 1 year

## 2017-09-09 ENCOUNTER — Encounter (INDEPENDENT_AMBULATORY_CARE_PROVIDER_SITE_OTHER): Payer: Self-pay | Admitting: Orthopaedic Surgery

## 2017-09-09 ENCOUNTER — Ambulatory Visit (INDEPENDENT_AMBULATORY_CARE_PROVIDER_SITE_OTHER): Payer: Medicare Other | Admitting: Orthopaedic Surgery

## 2017-09-09 VITALS — Ht 67.0 in | Wt 205.0 lb

## 2017-09-09 DIAGNOSIS — G8929 Other chronic pain: Secondary | ICD-10-CM | POA: Diagnosis not present

## 2017-09-09 DIAGNOSIS — M25512 Pain in left shoulder: Secondary | ICD-10-CM

## 2017-09-09 DIAGNOSIS — M25511 Pain in right shoulder: Secondary | ICD-10-CM

## 2017-09-09 MED ORDER — LIDOCAINE HCL 1 % IJ SOLN
3.0000 mL | INTRAMUSCULAR | Status: AC | PRN
  Administered 2017-09-09: 3 mL

## 2017-09-09 MED ORDER — METHYLPREDNISOLONE ACETATE 40 MG/ML IJ SUSP
40.0000 mg | INTRAMUSCULAR | Status: AC | PRN
  Administered 2017-09-09: 40 mg via INTRA_ARTICULAR

## 2017-09-09 NOTE — Progress Notes (Signed)
Office Visit Note   Patient: Lori Orozco           Date of Birth: 1950-12-26           MRN: [id number] Visit Date: 09/09/2017              Requested by: Tower, Audrie Gallus, MD [address] [place], [place] [address] PCP: [name], MD   Assessment & Plan: Visit Diagnoses:  1. Chronic pain of both shoulders     Plan: Since is been a while since injections in her shoulders I do not mind trying this in both her shoulders today in subacromial space.  She understands fully the risk and benefits of steroid injections as well.  She tolerated them well.  She knows to wait at least 3 months between injections.  Follow be otherwise as needed.  Follow-Up Instructions: Return if symptoms worsen or fail to improve.   Orders:  Orders Placed This Encounter  Procedures  . Large Joint Inj  . Large Joint Inj   No orders of the defined types were placed in this encounter.     Procedures: Large Joint Inj: R subacromial bursa on 09/09/2017 1:24 PM Indications: pain and diagnostic evaluation Details: 22 G 1.5 in needle  Arthrogram: No  Medications: 3 mL lidocaine 1 %; 40 mg methylPREDNISolone acetate 40 MG/ML Outcome: tolerated well, no immediate complications Procedure, treatment alternatives, risks and benefits explained, specific risks discussed. Consent was given by the patient. Immediately prior to procedure a time out was called to verify the correct patient, procedure, equipment, support staff and site/side marked as required. Patient was prepped and draped in the usual sterile fashion.   Large Joint Inj: L subacromial bursa on 09/09/2017 1:24 PM Indications: pain and diagnostic evaluation Details: 22 G 1.5 in needle  Arthrogram: No  Medications: 3 mL lidocaine 1 %; 40 mg methylPREDNISolone acetate 40 MG/ML Outcome: tolerated well, no immediate complications Procedure, treatment alternatives, risks and benefits explained, specific risks discussed. Consent was given  by the patient. Immediately prior to procedure a time out was called to verify the correct patient, procedure, equipment, support staff and site/side marked as required. Patient was prepped and draped in the usual sterile fashion.       Clinical Data: No additional findings.   Subjective: Chief Complaint  Patient presents with  . Left Shoulder - Pain    S/p injection left shoulder and elbow 06/10/17   . Left Elbow - Pain  Patient is well-known to me.  She is an avid Teacher, music.  She has bilateral shoulder impingement syndrome.  In light of an arthroscopic intervention of the left shoulder she still gets chronic pain in that shoulder.  Is been having right shoulder pain as well.  She denies weakness in the shoulder.  She comes in today hoping to have a steroid injection subacromial space on both shoulders.  She said her hips and elbows been doing well.  She is not a diabetic.  She has had no other acute changes in her medical status.   HPI  Review of Systems She currently denies any headache, chest pain, shortness of breath, fever, chills, nausea, vomiting.  Objective: Vital Signs: Ht 5\' 7"  (1.702 m)   Wt 205 lb (93 kg)   BMI 32.11 kg/m   Physical Exam She is alert and oriented x3 and in no acute distress Ortho Exam Examination of both shoulder shows good range of motion overall.  There is a slight  pain at the [facility] joint bilaterally in the subacromial outlet. Specialty Comments:  No specialty comments available.  Imaging: No results found.   PMFS History: Patient Active Problem List   Diagnosis Date Noted  . Estrogen deficiency 06/28/2017  . Medial epicondylitis, left elbow 04/29/2017  . S/P arthroscopy of left shoulder 02/25/2017  . Impingement syndrome of left shoulder 01/30/2017  . Chronic pain of right knee 11/12/2016  . Pedal edema 10/26/2016  . Right thigh pain 09/12/2016  . Trochanteric bursitis, right hip 04/10/2016  . Elevated BP without diagnosis of hypertension  09/02/2015  . Urge incontinence 07/27/2015  . Obesity 04/28/2015  . Need for hepatitis C screening test 04/26/2015  . Screening for HIV (human immunodeficiency virus) 04/26/2015  . Osteoarthritis of left knee 01/28/2015  . Status post total left knee replacement 01/28/2015  . Vitamin D deficiency 04/27/2014  . Seasonal and perennial allergic rhinitis 06/17/2013  . S/P total hip arthroplasty 03/20/2013  . 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 Encounter for Medicare annual wellness exam 11/18/2012  . History of falling 11/18/2012  . Routine general medical examination at a health care facility 11/10/2012  . 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 Osteopenia 10/19/2011  . Other screening mammogram 10/19/2011  . Hypothyroid 10/19/2011  . ANEMIA 10/11/2009  . PERIPHERAL NEUROPATHY, FEET 08/05/2009  . BACK PAIN 08/05/2009  . HAND PAIN, BILATERAL 08/05/2009  . TREMOR 03/09/2008  . MENOPAUSAL SYNDROME 10/09/2007  . DEPRESSION, MAJOR 05/20/2007  . BIPOLAR AFFECTIVE DISORDER 05/20/2007  . ANXIETY 05/20/2007  . 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 ESOPHAGEAL SPASM 05/20/2007  . HIATAL HERNIA 05/20/2007  . AMAUROSIS FUGAX 05/07/2007  . COPD mixed type (HCC) 03/27/2007  . HYPERCHOLESTEROLEMIA, PURE 12/10/2006  . SYMPTOM, SYNDROME, CHRONIC FATIGUE 12/10/2006   Past Medical History:  Diagnosis Date  . Amaurosis fugax   . Anemia    hx  . Anxiety   . Arthritis   . Asthma   . Cataract   . Chronic fatigue syndrome   . Chronic kidney disease    frequency, nephritis when 68 yrs old  . COPD (chronic obstructive pulmonary disease) (HCC)   . Depression   . Diverticulitis   . Emphysema of lung (HCC)   . Fibromyalgia   . GERD (gastroesophageal reflux disease)    occ  . H/O hiatal hernia   . History of bronchitis   . Hyperlipidemia   . Hypothyroidism   . Interstitial cystitis   . Irritable bowel syndrome   . Lichen sclerosus   . Lumbar herniated  disc   . Migraines   . Pneumonia   . PONV (postoperative nausea and vomiting)   . Shortness of breath    exertion   . Thyroid disease    Graves  . Urinary frequency   . Urinary tract infection   . Vertigo     Family History  Problem Relation Age of Onset  . Thyroid disease Mother   . Hypertension Mother   . Kidney disease Mother   . Cancer Father   . Colon cancer Other     Past Surgical History:  Procedure Laterality Date  . ABDOMINAL HYSTERECTOMY    . bladder sugery     . CAROTID DOPPLAR    . COLONOSCOPY  05/26/2010   avms- otherwise nl , re check 10y  . DEXA-OSTEOPENIA    . DOPPLER ECHOCARDIOGRAPHY    . EPICONDYLITIS    . EYE SURGERY Bilateral  cataracts  . FOOT SURGERY Bilateral   . KNEE SURGERY     Left cartilage  . lipoma in second finger right hand    . PLANTAR FASCIA SURGERY Left   . TEMPOROMANDIBULAR JOINT SURGERY    . TONSILLECTOMY    . TOTAL HIP ARTHROPLASTY Right 03/20/2013   Procedure: Right TOTAL HIP ARTHROPLASTY;  Surgeon: [name], MD;  Location: [facility];  Service: Orthopedics;  Laterality: Right;  Right Total Hip Arthroplasty  . TOTAL KNEE ARTHROPLASTY Left 01/28/2015   Procedure: LEFT TOTAL KNEE ARTHROPLASTY;  Surgeon: [name], MD;  Location: [facility];  Service: Orthopedics;  Laterality: Left;  . TRIGGER FINGER RELEASE Right   . TROCHANTERIC BURSA EXCISION Right 05/03/2016   Dr. [name], [name]  . WRIST ARTHROSCOPY Right    ligament tear   Social History   Occupational History  . Not on file  Tobacco Use  . Smoking status: Former Smoker    Packs/day: 2.00    Years: 43.00    Pack years: 86.00    Types: Cigarettes    Last attempt to quit: 02/26/2013    Years since quitting: 4.5  . Smokeless tobacco: Never Used  . Tobacco comment: "vaping now"  Substance and Sexual Activity  . Alcohol use: No    Alcohol/week: 0.0 oz  . Drug use: No  . Sexual activity: Not Currently

## 2017-09-26 ENCOUNTER — Telehealth (INDEPENDENT_AMBULATORY_CARE_PROVIDER_SITE_OTHER): Payer: Self-pay | Admitting: Orthopaedic Surgery

## 2017-09-26 NOTE — Telephone Encounter (Signed)
Patient called stating that she is buying a house that has stairs.  She is putting in a stair lift and was told that if she had an RX for the lift, it will cover the taxes for the lift.  CB#[phone].  Thank you.

## 2017-09-26 NOTE — Telephone Encounter (Signed)
If you agree, can you write and sign and I will get this to her

## 2017-09-26 NOTE — Telephone Encounter (Signed)
That sounds fine/I agree with a lift.

## 2017-09-27 NOTE — Telephone Encounter (Signed)
Patient aware written and at front desk

## 2017-12-11 ENCOUNTER — Other Ambulatory Visit: Payer: Self-pay | Admitting: Obstetrics & Gynecology

## 2017-12-12 ENCOUNTER — Encounter (INDEPENDENT_AMBULATORY_CARE_PROVIDER_SITE_OTHER): Payer: Self-pay | Admitting: Orthopaedic Surgery

## 2017-12-12 ENCOUNTER — Ambulatory Visit (INDEPENDENT_AMBULATORY_CARE_PROVIDER_SITE_OTHER): Payer: Medicare Other | Admitting: Orthopaedic Surgery

## 2017-12-12 DIAGNOSIS — M25511 Pain in right shoulder: Secondary | ICD-10-CM | POA: Diagnosis not present

## 2017-12-12 DIAGNOSIS — M7702 Medial epicondylitis, left elbow: Secondary | ICD-10-CM | POA: Diagnosis not present

## 2017-12-12 DIAGNOSIS — G8929 Other chronic pain: Secondary | ICD-10-CM | POA: Diagnosis not present

## 2017-12-12 DIAGNOSIS — M25512 Pain in left shoulder: Secondary | ICD-10-CM | POA: Diagnosis not present

## 2017-12-12 DIAGNOSIS — Z9889 Other specified postprocedural states: Secondary | ICD-10-CM | POA: Diagnosis not present

## 2017-12-12 MED ORDER — LIDOCAINE HCL 1 % IJ SOLN
1.0000 mL | INTRAMUSCULAR | Status: AC | PRN
  Administered 2017-12-12: 1 mL

## 2017-12-12 MED ORDER — METHYLPREDNISOLONE ACETATE 40 MG/ML IJ SUSP
40.0000 mg | INTRAMUSCULAR | Status: AC | PRN
  Administered 2017-12-12: 40 mg

## 2017-12-12 NOTE — Progress Notes (Signed)
Office Visit Note   Patient: Lori Orozco           Date of Birth: Jun 28, 1950           MRN: [id number] Visit Date: 12/12/2017              Requested by: Tower, Audrie Gallus, MD [address] [place], [place] [address] PCP: [name], MD   Assessment & Plan: Visit Diagnoses:  1. Chronic pain of both shoulders   2. S/P arthroscopy of left shoulder   3. Medial epicondylitis of elbow, left     Plan: At this point I did place an injection of the medial epicondyle area of her left elbow.  We are going to send her for an MRI of her left shoulder to further assess her shoulder joint and soft tissues to help better determine what is going on with her shoulder.  Follow-Up Instructions: Return in about 2 weeks (around 12/26/2017).   Orders:  No orders of the defined types were placed in this encounter.  No orders of the defined types were placed in this encounter.     Procedures: Hand/UE Inj: L elbow for medial epicondylitis on 12/12/2017 1:39 PM Medications: 1 mL lidocaine 1 %; 40 mg methylPREDNISolone acetate 40 MG/ML      Clinical Data: No additional findings.   Subjective: Chief Complaint  Patient presents with  . Left Shoulder - Follow-up  . Right Shoulder - Follow-up  The patient comes in with continued left chronic shoulder pain.  Is getting worse for her.  We have tried an arthroscopic intervention back in December and found a partial-thickness rotator cuff tear which was debrided and performed a subacromial decompression.  She still has the same amount of pain she had before surgery.  She is also having pain in the medial epicondyle area of her left elbow and like an injection in that area today.  HPI  Review of Systems She currently denies any headache, chest pain, shortness of breath, fever, chills, nausea, vomiting.  Objective: Vital Signs: There were no vitals taken for this visit.  Physical Exam She is alert and oriented x3 and in no acute  distress Ortho Exam Examination of her left shoulder shows pain in any plane of motion left at her throat shoulder through there is no evidence of infection but even palpating around the subacromial outlet causes severe pain.  There is also pain over the medial epicondyle of her right elbow.  She is neurovascular intact otherwise with a normal neck exam as well. Specialty Comments:  No specialty comments available.  Imaging: No results found.   PMFS History: Patient Active Problem List   Diagnosis Date Noted  . Estrogen deficiency 06/28/2017  . Medial epicondylitis, left elbow 04/29/2017  . S/P arthroscopy of left shoulder 02/25/2017  . Impingement syndrome of left shoulder 01/30/2017  . Chronic pain of right knee 11/12/2016  . Pedal edema 10/26/2016  . Right thigh pain 09/12/2016  . Trochanteric bursitis, right hip 04/10/2016  . Elevated BP without diagnosis of hypertension 09/02/2015  . Urge incontinence 07/27/2015  . Obesity 04/28/2015  . Need for hepatitis C screening test 04/26/2015  . Screening for HIV (human immunodeficiency virus) 04/26/2015  . Osteoarthritis of left knee 01/28/2015  . Status post total left knee replacement 01/28/2015  . Vitamin D deficiency 04/27/2014  . Seasonal and perennial allergic rhinitis 06/17/2013  . S/P total hip arthroplasty 03/20/2013  . Lichen sclerosus et atrophicus of the  vulva 01/29/2013  . Encounter for Medicare annual wellness exam 11/18/2012  . History of falling 11/18/2012  . Routine general medical examination at a health care facility 11/10/2012  . 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 Osteopenia 10/19/2011  . Other screening mammogram 10/19/2011  . Hypothyroid 10/19/2011  . ANEMIA 10/11/2009  . PERIPHERAL NEUROPATHY, FEET 08/05/2009  . BACK PAIN 08/05/2009  . HAND PAIN, BILATERAL 08/05/2009  . TREMOR 03/09/2008  . MENOPAUSAL SYNDROME 10/09/2007  . DEPRESSION, MAJOR 05/20/2007  . BIPOLAR AFFECTIVE  DISORDER 05/20/2007  . ANXIETY 05/20/2007  . 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 ESOPHAGEAL SPASM 05/20/2007  . HIATAL HERNIA 05/20/2007  . AMAUROSIS FUGAX 05/07/2007  . COPD mixed type (HCC) 03/27/2007  . HYPERCHOLESTEROLEMIA, PURE 12/10/2006  . SYMPTOM, SYNDROME, CHRONIC FATIGUE 12/10/2006   Past Medical History:  Diagnosis Date  . Amaurosis fugax   . Anemia    hx  . Anxiety   . Arthritis   . Asthma   . Cataract   . Chronic fatigue syndrome   . Chronic kidney disease    frequency, nephritis when 67 yrs old  . COPD (chronic obstructive pulmonary disease) (HCC)   . Depression   . Diverticulitis   . Emphysema of lung (HCC)   . Fibromyalgia   . GERD (gastroesophageal reflux disease)    occ  . H/O hiatal hernia   . History of bronchitis   . Hyperlipidemia   . Hypothyroidism   . Interstitial cystitis   . Irritable bowel syndrome   . Lichen sclerosus   . Lumbar herniated disc   . Migraines   . Pneumonia   . PONV (postoperative nausea and vomiting)   . Shortness of breath    exertion   . Thyroid disease    Graves  . Urinary frequency   . Urinary tract infection   . Vertigo     Family History  Problem Relation Age of Onset  . Thyroid disease Mother   . Hypertension Mother   . Kidney disease Mother   . Cancer Father   . Colon cancer Other     Past Surgical History:  Procedure Laterality Date  . ABDOMINAL HYSTERECTOMY    . bladder sugery     . CAROTID DOPPLAR    . COLONOSCOPY  05/26/2010   avms- otherwise nl , re check 10y  . DEXA-OSTEOPENIA    . DOPPLER ECHOCARDIOGRAPHY    . EPICONDYLITIS    . EYE SURGERY Bilateral    cataracts  . FOOT SURGERY Bilateral   . KNEE SURGERY     Left cartilage  . lipoma in second finger right hand    . PLANTAR FASCIA SURGERY Left   . TEMPOROMANDIBULAR JOINT SURGERY    . TONSILLECTOMY    . TOTAL HIP ARTHROPLASTY Right 03/20/2013   Procedure: Right TOTAL HIP ARTHROPLASTY;  Surgeon: [name], MD;  Location: [facility];   Service: Orthopedics;  Laterality: Right;  Right Total Hip Arthroplasty  . TOTAL KNEE ARTHROPLASTY Left 01/28/2015   Procedure: LEFT TOTAL KNEE ARTHROPLASTY;  Surgeon: [name], MD;  Location: [facility];  Service: Orthopedics;  Laterality: Left;  . TRIGGER FINGER RELEASE Right   . TROCHANTERIC BURSA EXCISION Right 05/03/2016   Dr. [name], [name]  . WRIST ARTHROSCOPY Right    ligament tear   Social History   Occupational History  . Not on file  Tobacco Use  . Smoking status: Former Smoker    Packs/day: 2.00  Years: 43.00    Pack years: 86.00    Types: Cigarettes    Last attempt to quit: 02/26/2013    Years since quitting: 4.7  . Smokeless tobacco: Never Used  . Tobacco comment: "vaping now"  Substance and Sexual Activity  . Alcohol use: No    Alcohol/week: 0.0 standard drinks  . Drug use: No  . Sexual activity: Not Currently

## 2017-12-13 ENCOUNTER — Ambulatory Visit (INDEPENDENT_AMBULATORY_CARE_PROVIDER_SITE_OTHER): Payer: Medicare Other | Admitting: Internal Medicine

## 2017-12-13 ENCOUNTER — Encounter: Payer: Self-pay | Admitting: Internal Medicine

## 2017-12-13 DIAGNOSIS — J449 Chronic obstructive pulmonary disease, unspecified: Secondary | ICD-10-CM | POA: Diagnosis not present

## 2017-12-13 DIAGNOSIS — K224 Dyskinesia of esophagus: Secondary | ICD-10-CM | POA: Diagnosis not present

## 2017-12-13 MED ORDER — ALBUTEROL SULFATE HFA 108 (90 BASE) MCG/ACT IN AERS: INHALATION_SPRAY | RESPIRATORY_TRACT | 12 refills | Status: DC

## 2017-12-13 NOTE — Patient Instructions (Signed)
Proair renewed in chart to be available when you need a refill   Please call if we can help

## 2017-12-13 NOTE — Assessment & Plan Note (Addendum)
She feels that she is at baseline.  I have emphasized the importance of stopping recreational inhalation-vapes with particular attention to the recent associated lung injury cases. Plan-we are updating [employer] prescription in the record for refills when needed

## 2017-12-13 NOTE — Progress Notes (Signed)
Subjective:    Patient ID: Lori Orozco, female    DOB: 03-02-1950, 67 y.o.   MRN: [id number]  HPI  female former cigarette/ VAPE smoker followed for COPD, complicated by bipolar, GERD,  PFT 10/09/10-  ----------------------------------------------------------------------------- 07/01/2015-67 year old female VAPE smoker followed for COPD, nicotine use, complicated by bipolar FOLLOW FOR: breathing doing well.  no concerns.  CAT Score: 14  She says she is doing fine since quitting smoking 3 years ago. Still enjoys a nicotine- free VAPE. Only occasionally needs ProAir rescue inhaler Asks Z-Pak to hold CXR 07/01/2014-NAD -----COPD mixed type: Pt states she is doing well overall  Pro-air HFA, She still vapes occasionally but avoids flavored products other than tobacco and is very much aware of the recent lung injury cases.  I advised her to stop completely.  She feels her breathing is good with little cough or wheeze and only using rescue inhaler every few months.  Up-to-date flu and pneumonia vaccines.  She denies acute concerns. CXR 11/29/2016- No active cardiopulmonary disease.  ROS-see HPI   + = positive Constitutional:   No-   weight loss, night sweats, fevers, chills, fatigue, lassitude. HEENT:   No-  headaches, difficulty swallowing, tooth/dental problems, sore throat,       No-  sneezing, itching, ear ache, nasal congestion, post nasal drip,  CV:  No-   chest pain, orthopnea, PND, swelling in lower extremities, anasarca, dizziness, palpitations Resp: +shortness of breath with exertion or at rest.              No- productive cough,   non-productive cough,  No- coughing up of blood.               No-   change in color of mucus.  No- wheezing.   Skin: No-   rash or lesions. GI:  No-   heartburn, indigestion, abdominal pain, nausea, vomiting,  GU: MS:  + joint pain or swelling.  . Neuro-     nothing unusual Psych:  No- change in mood or affect. No depression or anxiety.  No  memory loss.  Objective:   Physical Exam  General- Alert, Oriented, Affect-appropriate, Distress- none acute.   Skin- rash-none, lesions- none, excoriation- none Lymphadenopathy- none Head- atraumatic            Eyes- Gross vision intact, PERRLA, conjunctivae clear secretions            Ears- Hearing, canals-normal            Nose- Clear, no-Septal dev, mucus, polyps, erosion, perforation             Throat- Mallampati III-IV , mucosa clear , drainage- none, tonsils- atrophic Neck- flexible , trachea midline, no stridor , thyroid nl, carotid no bruit Chest - symmetrical excursion , unlabored           Heart/CV- RRR , no murmur , no gallop  , no rub, nl s1 s2                           - JVD- none , edema- none, stasis changes- none, varices- none           Lung- clear , unlabored, cough-none , dullness-none, rub- none           Chest wall-   Abd-  Br/ Gen/ Rectal- Not done, not indicated Extrem- cyanosis- none, clubbing, none, atrophy- none, strength- nl Neuro- + Jiggling right leg

## 2017-12-13 NOTE — Assessment & Plan Note (Signed)
Reminded her importance of reflux precautions.

## 2017-12-19 ENCOUNTER — Encounter (INDEPENDENT_AMBULATORY_CARE_PROVIDER_SITE_OTHER): Payer: Self-pay | Admitting: Orthopaedic Surgery

## 2017-12-19 ENCOUNTER — Other Ambulatory Visit (INDEPENDENT_AMBULATORY_CARE_PROVIDER_SITE_OTHER): Payer: Self-pay | Admitting: Orthopaedic Surgery

## 2017-12-19 DIAGNOSIS — G8929 Other chronic pain: Secondary | ICD-10-CM

## 2017-12-19 DIAGNOSIS — M25512 Pain in left shoulder: Principal | ICD-10-CM

## 2017-12-24 ENCOUNTER — Ambulatory Visit
Admission: RE | Admit: 2017-12-24 | Discharge: 2017-12-24 | Disposition: A | Discharge status: Home / Self Care | Payer: Medicare Other | Source: Ambulatory Visit | Attending: Orthopaedic Surgery | Admitting: Orthopaedic Surgery

## 2017-12-24 DIAGNOSIS — G8929 Other chronic pain: Secondary | ICD-10-CM

## 2017-12-24 DIAGNOSIS — M25512 Pain in left shoulder: Principal | ICD-10-CM

## 2017-12-26 ENCOUNTER — Ambulatory Visit (INDEPENDENT_AMBULATORY_CARE_PROVIDER_SITE_OTHER): Payer: Medicare Other | Admitting: Physician Assistant

## 2017-12-26 ENCOUNTER — Encounter (INDEPENDENT_AMBULATORY_CARE_PROVIDER_SITE_OTHER): Payer: Self-pay | Admitting: Physician Assistant

## 2017-12-26 ENCOUNTER — Ambulatory Visit (INDEPENDENT_AMBULATORY_CARE_PROVIDER_SITE_OTHER): Payer: Medicare Other | Admitting: Orthopaedic Surgery

## 2017-12-26 DIAGNOSIS — M75122 Complete rotator cuff tear or rupture of left shoulder, not specified as traumatic: Secondary | ICD-10-CM | POA: Diagnosis not present

## 2017-12-26 NOTE — Progress Notes (Signed)
HPI: Ms. Lori Orozco comes in today to discuss the results of her left shoulder.  She continues to have chronic pain in her left shoulder she states her pain is actually getting worse.  She underwent a left shoulder arthroscopy in mid December 2018 with subacromial decompression and debridement she had a partial rotator cuff tear which was debrided.  She had multiple injections in her shoulder and continues to have pain in the shoulder.  MRI results were reviewed with the patient today shoulder model is used for visualization purposes.  MRI showed a complete tear of the supraspinatus tendon with retraction measuring 4.3 cm.  Mild tendinosis of the infraspinatus tendon and the articular portion of the long head of the biceps tendon.  Impression: Left shoulder rotator cuff tear with retraction  Plan: This point time recommend left shoulder arthroscopy with possible rotator cuff repair.  Discussed with her the possibility that the cuff could not be repaired.  Also discussed the postoperative recovery if the rotator cuff is able to be repaired and how she would be unable to do any overhead activity or extreme external rotation for approximately 6 weeks.  She needs significant amount of therapy afterwards.  Of note she did have a reaction to the block she received at the time of the last shoulder arthroscopy and had to go to the ER.  She like to proceed with a left shoulder arthroscopy with possible rotator cuff repair in the near future.  We will see her back 1 week postop.  Questions were encouraged and answered

## 2017-12-27 HISTORY — PX: ROTATOR CUFF REPAIR: SHX139

## 2018-01-09 ENCOUNTER — Encounter: Payer: Self-pay | Admitting: Orthopaedic Surgery

## 2018-01-09 DIAGNOSIS — M75122 Complete rotator cuff tear or rupture of left shoulder, not specified as traumatic: Secondary | ICD-10-CM

## 2018-01-16 ENCOUNTER — Encounter (INDEPENDENT_AMBULATORY_CARE_PROVIDER_SITE_OTHER): Payer: Self-pay | Admitting: Orthopaedic Surgery

## 2018-01-16 ENCOUNTER — Ambulatory Visit (INDEPENDENT_AMBULATORY_CARE_PROVIDER_SITE_OTHER): Payer: Medicare Other | Admitting: Orthopaedic Surgery

## 2018-01-16 DIAGNOSIS — Z9889 Other specified postprocedural states: Secondary | ICD-10-CM | POA: Insufficient documentation

## 2018-01-16 NOTE — Progress Notes (Signed)
The patient is one-week status post a left shoulder arthroscopy with a rotator cuff repair.  We were able to actually repair a cleavage tear in the rotator cuff.  There was an abundant amount of inflamed tissue that we debrided as well.  This is someone who we want to go very slow on in terms of her rehab.  I will likely not put her through much physical therapy at all.  We will at least wait until she follows up over the next several weeks to determine this or not.  She reports that she has not had much pain at all with the shoulder and she feels better overall.  She has been adherent and compliant with wearing her shoulder abduction sling.  On examination I removed all 3 sutures from her left shoulder and placed Steri-Strips.  Her axillary nerve functions.  The shoulder is weak to be expected.  I shared with her the arthroscopy pictures from her surgery.  We talked about what to expect in the shoulder.  She is going work on Catering manager exercises and come out of her sling several times a day but she should still sleep in her sling at night and wear it out in public.  We will reevaluate her clinical exam in 3 weeks.

## 2018-02-04 ENCOUNTER — Other Ambulatory Visit: Payer: Self-pay | Admitting: Acute Care

## 2018-02-06 ENCOUNTER — Encounter (INDEPENDENT_AMBULATORY_CARE_PROVIDER_SITE_OTHER): Payer: Self-pay | Admitting: Orthopaedic Surgery

## 2018-02-06 ENCOUNTER — Ambulatory Visit (INDEPENDENT_AMBULATORY_CARE_PROVIDER_SITE_OTHER): Payer: Medicare Other | Admitting: Orthopaedic Surgery

## 2018-02-06 DIAGNOSIS — Z9889 Other specified postprocedural states: Secondary | ICD-10-CM

## 2018-02-06 NOTE — Progress Notes (Signed)
HPI: Lori Orozco returns now 4 weeks status post left shoulder arthroscopy with arthroscopic rotator cuff repair.  She is overall doing well states her shoulder pain is better than it was preop.  She has been doing pendulum and Codman exercises at home on her own.  She is wearing the sling whenever she is sleeping and out of the home.  She had no fevers chills shortness of breath chest pain.  Physical exam: Left elbow she has full extension and flexion.  Has full supination pronation forearm.  No testing rotator cuff today.  Impression: 4 weeks status post left shoulder arthroscopy with rotator cuff repair  Plan: She will continue her home exercises for the next 2 weeks avoiding any overhead activity and extreme external rotation or abduction.  At 6 weeks postop she will start outpatient physical therapy.  Also at that point she can stop wearing the sling when sleeping.  However advised her to continue to wear the sling when out of the home.  She will follow-up with [place] in 1 month check progress lack of.

## 2018-03-06 ENCOUNTER — Ambulatory Visit (INDEPENDENT_AMBULATORY_CARE_PROVIDER_SITE_OTHER): Payer: Medicare Other | Admitting: Orthopaedic Surgery

## 2018-03-06 ENCOUNTER — Encounter (INDEPENDENT_AMBULATORY_CARE_PROVIDER_SITE_OTHER): Payer: Self-pay | Admitting: Orthopaedic Surgery

## 2018-03-06 DIAGNOSIS — Z9889 Other specified postprocedural states: Secondary | ICD-10-CM

## 2018-03-06 DIAGNOSIS — M75122 Complete rotator cuff tear or rupture of left shoulder, not specified as traumatic: Secondary | ICD-10-CM

## 2018-03-06 NOTE — Progress Notes (Signed)
The patient is now 8-week status post a left shoulder arthroscopy with arthroscopically assisted rotator cuff repair.  The fibers are very tenuous and so we want to go slow on her.  She is 68 years old.  She has had 3 physical therapy sessions.  She has been wearing her sling as well.  On examination her left shoulder is certainly stiff.  At this point she can stop her sling.  She does show some integrity of the rotator cuff on exam.  I gave her notes for therapy to now begin aggressive motion and strengthening of her left shoulder.  We will see her back in 4 weeks to see how she is doing overall.  She will stop her sling.

## 2018-04-03 ENCOUNTER — Ambulatory Visit (INDEPENDENT_AMBULATORY_CARE_PROVIDER_SITE_OTHER): Payer: Medicare Other | Admitting: Orthopaedic Surgery

## 2018-04-03 ENCOUNTER — Telehealth (INDEPENDENT_AMBULATORY_CARE_PROVIDER_SITE_OTHER): Payer: Self-pay | Admitting: Orthopaedic Surgery

## 2018-04-03 ENCOUNTER — Encounter (INDEPENDENT_AMBULATORY_CARE_PROVIDER_SITE_OTHER): Payer: Self-pay | Admitting: Orthopaedic Surgery

## 2018-04-03 DIAGNOSIS — M75122 Complete rotator cuff tear or rupture of left shoulder, not specified as traumatic: Secondary | ICD-10-CM

## 2018-04-03 DIAGNOSIS — Z9889 Other specified postprocedural states: Secondary | ICD-10-CM

## 2018-04-03 MED ORDER — HYDROCODONE-ACETAMINOPHEN 5-325 MG PO TABS: 1.0000 | ORAL_TABLET | Freq: Four times a day (QID) | ORAL | 0 refills | Status: DC | PRN

## 2018-04-03 NOTE — Telephone Encounter (Signed)
Bret from [employer] called to clarify the quantity of the Hydrocodone RX that was sent over to her pharmacy.  It was put in for 300 tablets, was it suppose to be 30 tablets instead?  CB#[phone].  Thank you.

## 2018-04-03 NOTE — Telephone Encounter (Signed)
They asked for you to send in new script please

## 2018-04-03 NOTE — Progress Notes (Signed)
The patient is now 12 weeks status post left shoulder arthroscopy with an arthroscopically assisted rotator cuff.  This is a very hard repair of tissue that was not great.  She is 68 years old.  She is very pleased with how she is doing overall now having gone through therapy.  We had delayed therapy until we felt it was appropriate to get her moving.  We also want to let the repair feels much as possible.  On exam today her range of motion is full.  She looks great overall.  There is still weakness the cuff itself but she is made significant progress.  From my standpoint she can get back to her bowling.  I will send in some hydrocodone for her.  We will see her back in about 3 months to see how she is doing overall.

## 2018-04-04 ENCOUNTER — Ambulatory Visit (INDEPENDENT_AMBULATORY_CARE_PROVIDER_SITE_OTHER): Payer: Medicare Other | Admitting: Family Medicine

## 2018-04-04 ENCOUNTER — Encounter: Payer: Self-pay | Admitting: Family Medicine

## 2018-04-04 DIAGNOSIS — J449 Chronic obstructive pulmonary disease, unspecified: Secondary | ICD-10-CM

## 2018-04-04 MED ORDER — PREDNISONE 20 MG PO TABS: ORAL_TABLET | ORAL | 0 refills | Status: DC

## 2018-04-04 MED ORDER — AZITHROMYCIN 250 MG PO TABS: ORAL_TABLET | ORAL | 0 refills | Status: DC

## 2018-04-04 NOTE — Patient Instructions (Signed)
Start zithromax.  Prednisone with food.  Use tylenol if needed.  Continue using albuterol as needed.  Rest your voice.  Rest and fluids.  Update [place] as needed.  Take care.  Glad to see you.

## 2018-04-04 NOTE — Progress Notes (Signed)
Sx started about 5-6 days ago with cough.  Then hoarse voice.  More sputum in the last day or so.  Using SABA prn and that is atypical for patient.  Some aches.  Chest feels tighter at night.  She quit smoking about 6 years ago.  SABA use helps, only used when sick.  Some wheeze noted.  Sputum is yellow. No fevers.  No ear or facial pain.    Can tolerate zmax and pred, d/w pt.   Meds, vitals, and allergies reviewed.   ROS: Per HPI unless specifically indicated in ROS section   GEN: nad, alert and oriented HEENT: mucous membranes moist, tm w/o erythema, nasal exam w/o erythema, clear discharge noted,  OP with cobblestoning NECK: supple w/o LA CV: rrr.   PULM: ctab except for scant scattered rhonchi, no inc wob EXT: no edema SKIN: well perfused.

## 2018-04-06 NOTE — Assessment & Plan Note (Signed)
Presumed exacerbation.  Still okay for outpatient follow-up. Start zithromax.  Prednisone with food.  Use tylenol if needed.  Continue using albuterol as needed.  Rest voice.  Rest and fluids.  Update us as needed.  She agrees with plan.

## 2018-04-12 ENCOUNTER — Encounter: Payer: Self-pay | Admitting: Family Medicine

## 2018-04-14 MED ORDER — SCOPOLAMINE 1 MG/3DAYS TD PT72: 1.0000 | MEDICATED_PATCH | TRANSDERMAL | 1 refills | Status: DC

## 2018-04-14 NOTE — Telephone Encounter (Signed)
Transderm scop patch sent

## 2018-04-21 ENCOUNTER — Telehealth (INDEPENDENT_AMBULATORY_CARE_PROVIDER_SITE_OTHER): Payer: Self-pay | Admitting: Orthopaedic Surgery

## 2018-04-21 ENCOUNTER — Other Ambulatory Visit (INDEPENDENT_AMBULATORY_CARE_PROVIDER_SITE_OTHER): Payer: Self-pay

## 2018-04-21 MED ORDER — NABUMETONE 750 MG PO TABS: 750.0000 mg | ORAL_TABLET | Freq: Two times a day (BID) | ORAL | 1 refills | Status: DC | PRN

## 2018-04-21 NOTE — Telephone Encounter (Signed)
Patient called requesting an RX for Relafen.  She stated that her elbow is hurting her and she does not see Dr. [name] until May.  CB#[phone].  Thank you.

## 2018-04-21 NOTE — Telephone Encounter (Signed)
Please advise 

## 2018-06-20 ENCOUNTER — Other Ambulatory Visit: Payer: Self-pay | Admitting: Family Medicine

## 2018-06-20 NOTE — Telephone Encounter (Signed)
Pharmacy requests refill on patients Simvastatin:  Last refilled: #30, 11 refills on 06/28/17 Last OV: Annual Exam 06/28/17/ acute COPD OV on 04/04/18  Next OV: 06/30/18 for AWV, patient also due for labs at that time.   Will Refill X1 only to ensure compliance with appointment and lab follow up.

## 2018-06-25 ENCOUNTER — Telehealth: Payer: Self-pay | Admitting: Family Medicine

## 2018-06-25 DIAGNOSIS — R600 Localized edema: Secondary | ICD-10-CM

## 2018-06-25 DIAGNOSIS — E7800 Pure hypercholesterolemia, unspecified: Secondary | ICD-10-CM

## 2018-06-25 DIAGNOSIS — R030 Elevated blood-pressure reading, without diagnosis of hypertension: Secondary | ICD-10-CM

## 2018-06-25 DIAGNOSIS — E559 Vitamin D deficiency, unspecified: Secondary | ICD-10-CM

## 2018-06-25 DIAGNOSIS — E039 Hypothyroidism, unspecified: Secondary | ICD-10-CM

## 2018-06-25 DIAGNOSIS — D649 Anemia, unspecified: Secondary | ICD-10-CM

## 2018-06-25 NOTE — Telephone Encounter (Signed)
-----   Message from Aquilla Solian, RT sent at 06/25/2018 10:19 AM EDT ----- Regarding: Lab Orders for Monday 5.4.2020 Please place lab orders for 5.4.2020, Doxy.me visit on Tuesday 5.5.2020 Thank you, Jones Bales RT(R)

## 2018-06-27 ENCOUNTER — Other Ambulatory Visit: Payer: Self-pay | Admitting: Family Medicine

## 2018-06-30 ENCOUNTER — Other Ambulatory Visit: Payer: Self-pay

## 2018-06-30 ENCOUNTER — Encounter: Payer: Medicare Other | Admitting: Family Medicine

## 2018-06-30 ENCOUNTER — Other Ambulatory Visit (INDEPENDENT_AMBULATORY_CARE_PROVIDER_SITE_OTHER): Payer: Medicare Other

## 2018-06-30 ENCOUNTER — Ambulatory Visit (INDEPENDENT_AMBULATORY_CARE_PROVIDER_SITE_OTHER): Payer: Medicare Other

## 2018-06-30 DIAGNOSIS — E7800 Pure hypercholesterolemia, unspecified: Secondary | ICD-10-CM | POA: Diagnosis not present

## 2018-06-30 DIAGNOSIS — R030 Elevated blood-pressure reading, without diagnosis of hypertension: Secondary | ICD-10-CM | POA: Diagnosis not present

## 2018-06-30 DIAGNOSIS — D649 Anemia, unspecified: Secondary | ICD-10-CM | POA: Diagnosis not present

## 2018-06-30 DIAGNOSIS — E559 Vitamin D deficiency, unspecified: Secondary | ICD-10-CM | POA: Diagnosis not present

## 2018-06-30 DIAGNOSIS — E039 Hypothyroidism, unspecified: Secondary | ICD-10-CM

## 2018-06-30 DIAGNOSIS — R600 Localized edema: Secondary | ICD-10-CM | POA: Diagnosis not present

## 2018-06-30 DIAGNOSIS — Z0000 Encounter for general adult medical examination without abnormal findings: Secondary | ICD-10-CM

## 2018-06-30 LAB — COMPREHENSIVE METABOLIC PANEL
ALT: 7 U/L (ref 0–35)
AST: 13 U/L (ref 0–37)
Albumin: 3.7 g/dL (ref 3.5–5.2)
Alkaline Phosphatase: 55 U/L (ref 39–117)
BUN: 24 mg/dL — ABNORMAL HIGH (ref 6–23)
CO2: 30 mEq/L (ref 19–32)
Calcium: 9 mg/dL (ref 8.4–10.5)
Chloride: 102 mEq/L (ref 96–112)
Creatinine, Ser: 0.99 mg/dL (ref 0.40–1.20)
GFR: 55.78 mL/min — ABNORMAL LOW (ref >60.00)
Glucose, Bld: 89 mg/dL (ref 70–99)
Potassium: 4.4 mEq/L (ref 3.5–5.1)
Sodium: 139 mEq/L (ref 135–145)
Total Bilirubin: 0.4 mg/dL (ref 0.2–1.2)
Total Protein: 6.5 g/dL (ref 6.0–8.3)

## 2018-06-30 LAB — CBC WITH DIFFERENTIAL/PLATELET
Basophils Absolute: 0 10*3/uL (ref 0.0–0.1)
Basophils Relative: 0.4 % (ref 0.0–3.0)
Eosinophils Absolute: 0.1 10*3/uL (ref 0.0–0.7)
Eosinophils Relative: 1.6 % (ref 0.0–5.0)
HCT: 36.7 % (ref 36.0–46.0)
Hemoglobin: 12.2 g/dL (ref 12.0–15.0)
Lymphocytes Relative: 45.5 % (ref 12.0–46.0)
Lymphs Abs: 2.3 10*3/uL (ref 0.7–4.0)
MCHC: 33.3 g/dL (ref 30.0–36.0)
MCV: 95.2 fl (ref 78.0–100.0)
Monocytes Absolute: 0.3 10*3/uL (ref 0.1–1.0)
Monocytes Relative: 5.9 % (ref 3.0–12.0)
Neutro Abs: 2.4 10*3/uL (ref 1.4–7.7)
Neutrophils Relative %: 46.6 % (ref 43.0–77.0)
Platelets: 195 10*3/uL (ref 150.0–400.0)
RBC: 3.85 Mil/uL — ABNORMAL LOW (ref 3.87–5.11)
RDW: 14.6 % (ref 11.5–15.5)
WBC: 5.1 10*3/uL (ref 4.0–10.5)

## 2018-06-30 LAB — LIPID PANEL
Cholesterol: 164 mg/dL (ref 0–200)
HDL: 62.2 mg/dL (ref >39.00)
LDL Cholesterol: 79 mg/dL (ref 0–99)
NonHDL: 101.49
Total CHOL/HDL Ratio: 3
Triglycerides: 110 mg/dL (ref 0.0–149.0)
VLDL: 22 mg/dL (ref 0.0–40.0)

## 2018-06-30 LAB — TSH: TSH: 2.82 u[IU]/mL (ref 0.35–4.50)

## 2018-06-30 LAB — VITAMIN D 25 HYDROXY (VIT D DEFICIENCY, FRACTURES): VITD: 45.85 ng/mL (ref 30.00–100.00)

## 2018-06-30 NOTE — Progress Notes (Signed)
Subjective:   Lori Orozco is a 68 y.o. female who presents for Medicare Annual (Subsequent) preventive examination.  Review of Systems:  N/A Cardiac Risk Factors include: advanced age (>16men, >27 women);obesity (BMI >30kg/m2);dyslipidemia     Objective:     Vitals: There were no vitals taken for this visit.  There is no height or weight on file to calculate BMI.  Advanced Directives 06/18/2017 05/09/2016 01/28/2015 01/19/2015 03/20/2013 03/16/2013  Does Patient Have a Medical Advance Directive? Yes Yes Yes Yes Patient has advance directive, copy not in chart Patient has advance directive, copy not in chart  Type of Advance Directive Living will Healthcare Power of [place];Living will Living will Living will Living will Living will  Does patient want to make changes to medical advance directive? - - No - Patient declined No - Patient declined No No  Copy of Healthcare Power of Attorney in Chart? - No - copy requested No - copy requested No - copy requested Copy requested from family Copy requested from family  Pre-existing out of facility DNR order (yellow form or pink MOST form) - - - - No -    Tobacco Social History   Tobacco Use  Smoking Status Former Smoker  . Packs/day: 2.00  . Years: 43.00  . Pack years: 86.00  . Types: Cigarettes  . Last attempt to quit: 02/26/2013  . Years since quitting: 5.3  Smokeless Tobacco Never Used  Tobacco Comment   "vaping now"     Counseling given: No Comment: "vaping now"   Clinical Intake:  Pre-visit preparation completed: Yes  Pain : No/denies pain Pain Score: 0-No pain     Nutritional Status: BMI > 30  Obese Nutritional Risks: None  How often do you need to have someone help you when you read instructions, pamphlets, or other written materials from your doctor or pharmacy?: 1 - Never  Interpreter Needed?: No  Information entered by :: LPinson, LPN  Past Medical History:  Diagnosis Date  . Amaurosis fugax   .  Anemia    hx  . Anxiety   . Arthritis   . Asthma   . Cataract   . Chronic fatigue syndrome   . Chronic kidney disease    frequency, nephritis when 68 yrs old  . COPD (chronic obstructive pulmonary disease) (HCC)   . Depression   . Diverticulitis   . Emphysema of lung (HCC)   . Fibromyalgia   . GERD (gastroesophageal reflux disease)    occ  . H/O hiatal hernia   . History of bronchitis   . Hyperlipidemia   . Hypothyroidism   . Interstitial cystitis   . Irritable bowel syndrome   . Lichen sclerosus   . Lumbar herniated disc   . Migraines   . Pneumonia   . PONV (postoperative nausea and vomiting)   . Shortness of breath    exertion   . Thyroid disease    [place]  . Urinary frequency   . Urinary tract infection   . Vertigo    Past Surgical History:  Procedure Laterality Date  . ABDOMINAL HYSTERECTOMY    . bladder sugery     . CAROTID DOPPLAR    . COLONOSCOPY  05/26/2010   avms- otherwise nl , re check 10y  . DEXA-OSTEOPENIA    . DOPPLER ECHOCARDIOGRAPHY    . EPICONDYLITIS    . EYE SURGERY Bilateral    cataracts  . FOOT SURGERY Bilateral   . KNEE SURGERY  Left cartilage  . lipoma in second finger right hand    . PLANTAR FASCIA SURGERY Left   . ROTATOR CUFF REPAIR Left 12/2017  . TEMPOROMANDIBULAR JOINT SURGERY    . TONSILLECTOMY    . TOTAL HIP ARTHROPLASTY Right 03/20/2013   Procedure: Right TOTAL HIP ARTHROPLASTY;  Surgeon: [name], MD;  Location: [facility] OR;  Service: Orthopedics;  Laterality: Right;  Right Total Hip Arthroplasty  . TOTAL KNEE ARTHROPLASTY Left 01/28/2015   Procedure: LEFT TOTAL KNEE ARTHROPLASTY;  Surgeon: [name], MD;  Location: [facility];  Service: Orthopedics;  Laterality: Left;  . TRIGGER FINGER RELEASE Right   . TROCHANTERIC BURSA EXCISION Right 05/03/2016   Dr. [name], [name]  . WRIST ARTHROSCOPY Right    ligament tear   Family History  Problem Relation Age of Onset  . Thyroid disease Mother   . Hypertension  Mother   . Kidney disease Mother   . Cancer Father   . Colon cancer Other    Social History   Socioeconomic History  . Marital status: Single    Spouse name: Not on file  . Number of children: Not on file  . Years of education: Not on file  . Highest education level: Not on file  Occupational History  . Not on file  Social Needs  . Financial resource strain: Not on file  . Food insecurity:    Worry: Not on file    Inability: Not on file  . Transportation needs:    Medical: Not on file    Non-medical: Not on file  Tobacco Use  . Smoking status: Former Smoker    Packs/day: 2.00    Years: 43.00    Pack years: 86.00    Types: Cigarettes    Last attempt to quit: 02/26/2013    Years since quitting: 5.3  . Smokeless tobacco: Never Used  . Tobacco comment: "vaping now"  Substance and Sexual Activity  . Alcohol use: No    Alcohol/week: 0.0 standard drinks  . Drug use: No  . Sexual activity: Not Currently  Lifestyle  . Physical activity:    Days per week: Not on file    Minutes per session: Not on file  . Stress: Not on file  Relationships  . Social connections:    Talks on phone: Not on file    Gets together: Not on file    Attends religious service: Not on file    Active member of club or organization: Not on file    Attends meetings of clubs or organizations: Not on file    Relationship status: Not on file  Other Topics Concern  . Not on file  Social History Narrative  . Not on file    Outpatient Encounter Medications as of 06/30/2018  Medication Sig  . albuterol (PROAIR HFA) 108 (90 Base) MCG/ACT inhaler INHALE 2 PUFFS INTO THE LUNGS EVERY 4 HOURS AS NEEDED FOR WHEEZING  . azithromycin (ZITHROMAX) 250 MG tablet 2 tabs a day for 1 day and then 1 a day for 4 days.  . baclofen (LIORESAL) 10 MG tablet TAKE 1 TABLET BY MOUTH EVERY 8 HOURS AS NEEDED FOR MUSCLE SPASMS  . buPROPion (WELLBUTRIN XL) 150 MG 24 hr tablet Take 150 mg by mouth daily.    . busPIRone (BUSPAR) 15  MG tablet Take 15 mg by mouth 2 (two) times daily before a meal.   . Cholecalciferol (VITAMIN D3) 2000 UNITS TABS Take 2,000 Units by mouth daily.  Marland Kitchen  clobetasol cream (TEMOVATE) 0.05 % APPLY SMALL AMOUNT TO AFFECTED AREAS 2 TO 3 TIMES PER WEEK  . cyanocobalamin 2000 MCG tablet Take 2,000 mcg by mouth daily.  . divalproex (DEPAKOTE ER) 250 MG 24 hr tablet Take 500 mg by mouth daily.  . fluticasone (FLONASE) 50 MCG/ACT nasal spray PLACE 2 SPRAYS INTO THE NOSE DAILY.  Marland Kitchen. HYDROcodone-acetaminophen (NORCO/VICODIN) 5-325 MG tablet Take 1 tablet by mouth every 6 (six) hours as needed for moderate pain.  Marland Kitchen. levothyroxine (SYNTHROID, LEVOTHROID) 50 MCG tablet TAKE 1 TABLET (50 MCG TOTAL) BY MOUTH DAILY.  Marland Kitchen. LORazepam (ATIVAN) 1 MG tablet Take 1 mg by mouth 2 (two) times daily as needed for anxiety.  . mirtazapine (REMERON) 30 MG tablet Take 30 mg by mouth at bedtime.   Marland Kitchen. MYRBETRIQ 25 MG TB24 tablet TAKE ONE TABLET BY MOUTH DAILY  . predniSONE (DELTASONE) 20 MG tablet Take 2 a day for 5 days, then 1 a day for 5 days, with food. Don't take with aleve/ibuprofen.  Marland Kitchen. PREMARIN 0.625 MG tablet Take 1 tablet (0.625 mg total) by mouth daily.  Marland Kitchen. scopolamine (TRANSDERM-SCOP, 1.5 MG,) 1 MG/3DAYS Place 1 patch (1.5 mg total) onto the skin every 3 (three) days.  Marland Kitchen. sertraline (ZOLOFT) 100 MG tablet Take 100 mg by mouth daily.   . simvastatin (ZOCOR) 20 MG tablet TAKE ONE TABLET BY MOUTH AT BEDTIME  . [DISCONTINUED] albuterol (PROAIR HFA) 108 (90 Base) MCG/ACT inhaler INHALE 2 PUFFS INTO THE LUNGS EVERY 4 (FOUR) HOURS AS NEEDED FOR WHEEZING.  . [DISCONTINUED] HYDROcodone-acetaminophen (NORCO/VICODIN) 5-325 MG tablet Take 1 tablet by mouth 2 (two) times daily as needed for moderate pain.  . [DISCONTINUED] mirabegron ER (MYRBETRIQ) 25 MG TB24 tablet Take 1 tablet (25 mg total) by mouth daily.  . [DISCONTINUED] nabumetone (RELAFEN) 750 MG tablet Take 1 tablet (750 mg total) by mouth 2 (two) times daily as needed.  .  [DISCONTINUED] simvastatin (ZOCOR) 20 MG tablet Take 1 tablet (20 mg total) by mouth at bedtime.   No facility-administered encounter medications on file as of 06/30/2018.     Activities of Daily Living In your present state of health, do you have any difficulty performing the following activities: 06/30/2018  Hearing? N  Vision? N  Difficulty concentrating or making decisions? N  Walking or climbing stairs? Y  Dressing or bathing? N  Doing errands, shopping? N  Preparing Food and eating ? N  Using the Toilet? N  In the past six months, have you accidently leaked urine? N  Do you have problems with loss of bowel control? N  Managing your Medications? N  Managing your Finances? N  Housekeeping or managing your Housekeeping? N  Some recent data might be hidden    Patient Care Team: Tower, Audrie GallusMarne A, MD as PCP - General Mckinley JewelStoneburner, Sara, MD as Consulting Physician (Ophthalmology) Phillip HealSteiner, Jane, MD as Referring Physician (Psychiatry) Allie Bossierove, Myra C, MD as Consulting Physician (Obstetrics and Gynecology) Waymon BudgeYoung, Clinton D, MD as Consulting Physician (Pulmonary Disease) Kathryne HitchBlackman, Christopher Y, MD as Consulting Physician (Orthopedic Surgery) Nadara Mustarduda, Marcus V, MD as Consulting Physician (Orthopedic Surgery) Tyrell AntonioNewton, Frederic, MD as Consulting Physician (Physical Medicine and Rehabilitation) Ronalee BeltsInman, Joseph, MD as Referring Physician (Otolaryngology) Doran HeaterMargaret Barnes, PhD as Referring Physician (Psychology)    Assessment:   This is a routine wellness examination for [place]hyllis.  Vision Screening Comments: Vision exam in Dec 2019 with Dr. Dagoberto LigasStoneburner  Exercise Activities and Dietary recommendations Current Exercise Habits: The patient does not participate in regular exercise at  present, Exercise limited by: None identified  Goals    . Follow up with Primary Care Provider     Starting 06/30/18, I will continue to take medications as prescribed and to keep appointments with PCP as scheduled.         Fall Risk Fall Risk  06/30/2018 06/18/2017 05/09/2016 04/26/2015 04/23/2014  Falls in the past year? 0 No Yes No No  Comment - - pt fell backwards over stacked bricks that were in yard; bruise to right hip - -  Number falls in past yr: - - 1 - -  Injury with Fall? - - Yes - -  Risk Factor Category  - - - - -  Comment - - - - -   Depression Screen PHQ 2/9 Scores 06/30/2018 06/18/2017 05/09/2016 04/26/2015  PHQ - 2 Score 0 0 6 0  PHQ- 9 Score 0 0 21 -     Cognitive Function MMSE - Mini Mental State Exam 06/30/2018 06/18/2017 05/09/2016  Orientation to time 5 5 5   Orientation to Place 5 5 5   Registration 3 3 3   Attention/ Calculation 0 0 0  Recall 3 1 3   Recall-comments - unable to recall 2 of 3 words -  Language- name 2 objects 0 0 0  Language- repeat 1 1 1   Language- follow 3 step command 0 3 3  Language- read & follow direction 0 0 0  Write a sentence 0 0 0  Copy design 0 0 0  Total score 17 18 20        PLEASE NOTE: A Mini-Cog screen was completed. Maximum score is 17. A value of 0 denotes this part of Folstein MMSE was not completed or the patient failed this part of the Mini-Cog screening.   Mini-Cog Screening Orientation to Time - Max 5 pts Orientation to Place - Max 5 pts Registration - Max 3 pts Recall - Max 3 pts Language Repeat - Max 1 pts    Immunization History  Administered Date(s) Administered  . Influenza Split 11/19/2011, 11/02/2013  . Influenza Whole 12/20/2006, 11/25/2008, 11/26/2009, 11/24/2010  . Influenza, High Dose Seasonal PF 12/13/2016  . Influenza,inj,Quad PF,6+ Mos 12/06/2015  . Influenza-Unspecified 12/08/2012, 12/03/2014, 12/08/2017  . Pneumococcal Conjugate-13 12/06/2015  . Pneumococcal Polysaccharide-23 01/26/2002, 03/23/2008, 09/07/2010, 06/28/2017  . Td 06/26/2000, 10/19/2011  . Zoster Recombinat (Shingrix) 09/16/2017    Screening Tests Health Maintenance  Topic Date Due  . MAMMOGRAM  02/26/2019 (Originally 06/14/2018)  . INFLUENZA VACCINE   09/27/2018  . COLONOSCOPY  05/25/2020  . TETANUS/TDAP  10/18/2021  . DEXA SCAN  Completed  . Hepatitis C Screening  Completed  . PNA vac Low Risk Adult  Completed      Plan:     I have personally reviewed, addressed, and noted the following in the patient's chart:  A. Medical and social history B. Use of alcohol, tobacco or illicit drugs  C. Current medications and supplements D. Functional ability and status E.  Nutritional status F.  Physical activity G. Advance directives H. List of other physicians I.  Hospitalizations, surgeries, and ER visits in previous 12 months J.  Vitals (unless it is a telemedicine encounter) K. Screenings to include cognitive, depression, hearing, vision (NOTE: hearing and vision screenings not completed in telemedicine encounter) L. Referrals and appointments   In addition, I have reviewed and discussed with patient certain preventive protocols, quality metrics, and best practice recommendations. A written personalized care plan for preventive services and recommendations were provided to patient.  With patient's permission, we connected on 06/30/18 at  8:00 AM EDT by a video enabled telemedicine application. Two patient identifiers were used to ensure the encounter occurred with the correct person.    Patient was in home and writer was in office.   Signed,   Randa EvensLesia Seleena Reimers, MHA, BS, LPN Health Coach

## 2018-06-30 NOTE — Patient Instructions (Addendum)
Lori Orozco , Thank you for taking time to come for your Medicare Wellness Visit. I appreciate your ongoing commitment to your health goals. Please review the following plan we discussed and let me know if I can assist you in the future.   These are the goals we discussed: Goals    . Follow up with Primary Care Provider     Starting 06/30/18, I will continue to take medications as prescribed and to keep appointments with PCP as scheduled.        This is a list of the screening recommended for you and due dates:  Health Maintenance  Topic Date Due  . Mammogram  02/26/2019*  . Flu Shot  09/27/2018  . Colon Cancer Screening  05/25/2020  . Tetanus Vaccine  10/18/2021  . DEXA scan (bone density measurement)  Completed  .  Hepatitis C: One time screening is recommended by Center for Disease Control  (CDC) for  adults born from [birth year] through 1965.   Completed  . Pneumonia vaccines  Completed  *Topic was postponed. The date shown is not the original due date.   Preventive Care for Adults  A healthy lifestyle and preventive care can promote health and wellness. Preventive health guidelines for adults include the following key practices.  . A routine yearly physical is a good way to check with your health care provider about your health and preventive screening. It is a chance to share any concerns and updates on your health and to receive a thorough exam.  . Visit your dentist for a routine exam and preventive care every 6 months. Brush your teeth twice a day and floss once a day. Good oral hygiene prevents tooth decay and gum disease.  . The frequency of eye exams is based on your age, health, family medical history, use  of contact lenses, and other factors. Follow your health care provider's recommendations for frequency of eye exams.  . Eat a healthy diet. Foods like vegetables, fruits, whole grains, low-fat dairy products, and lean protein foods contain the nutrients you need without  too many calories. Decrease your intake of foods high in solid fats, added sugars, and salt. Eat the right amount of calories for you. Get information about a proper diet from your health care provider, if necessary.  . Regular physical exercise is one of the most important things you can do for your health. Most adults should get at least 150 minutes of moderate-intensity exercise (any activity that increases your heart rate and causes you to sweat) each week. In addition, most adults need muscle-strengthening exercises on 2 or more days a week.  Silver Sneakers may be a benefit available to you. To determine eligibility, you may visit the website: www.silversneakers.com or contact program at 747-742-3975 Mon-Fri between 8AM-8PM.   . Maintain a healthy weight. The body mass index (BMI) is a screening tool to identify possible weight problems. It provides an estimate of body fat based on height and weight. Your health care provider can find your BMI and can help you achieve or maintain a healthy weight.   For adults 20 years and older: ? A BMI below 18.5 is considered underweight. ? A BMI of 18.5 to 24.9 is normal. ? A BMI of 25 to 29.9 is considered overweight. ? A BMI of 30 and above is considered obese.   . Maintain normal blood lipids and cholesterol levels by exercising and minimizing your intake of saturated fat. Eat a balanced diet with plenty of  fruit and vegetables. Blood tests for lipids and cholesterol should begin at age 2 and be repeated every 5 years. If your lipid or cholesterol levels are high, you are over 50, or you are at high risk for heart disease, you may need your cholesterol levels checked more frequently. Ongoing high lipid and cholesterol levels should be treated with medicines if diet and exercise are not working.  . If you smoke, find out from your health care provider how to quit. If you do not use tobacco, please do not start.  . If you choose to drink alcohol,  please do not consume more than 2 drinks per day. One drink is considered to be 12 ounces (355 mL) of beer, 5 ounces (148 mL) of wine, or 1.5 ounces (44 mL) of liquor.  . If you are 60-9 years old, ask your health care provider if you should take aspirin to prevent strokes.  . Use sunscreen. Apply sunscreen liberally and repeatedly throughout the day. You should seek shade when your shadow is shorter than you. Protect yourself by wearing long sleeves, pants, a wide-brimmed hat, and sunglasses year round, whenever you are outdoors.  . Once a month, do a whole body skin exam, using a mirror to look at the skin on your back. Tell your health care provider of new moles, moles that have irregular borders, moles that are larger than a pencil eraser, or moles that have changed in shape or color.

## 2018-06-30 NOTE — Progress Notes (Signed)
PCP notes:   Health maintenance:  Mammogram - postponed  Abnormal screenings:   None  Patient concerns:   None  Nurse concerns:  None  Next PCP appt:   07/01/18 @ 1430  I reviewed health advisor's note, was available for consultation, and agree with documentation and plan. Roxy Manns MD

## 2018-07-01 ENCOUNTER — Ambulatory Visit (INDEPENDENT_AMBULATORY_CARE_PROVIDER_SITE_OTHER): Payer: Medicare Other | Admitting: Family Medicine

## 2018-07-01 ENCOUNTER — Encounter: Payer: Self-pay | Admitting: Family Medicine

## 2018-07-01 VITALS — Ht 67.0 in | Wt 210.0 lb

## 2018-07-01 DIAGNOSIS — J449 Chronic obstructive pulmonary disease, unspecified: Secondary | ICD-10-CM

## 2018-07-01 DIAGNOSIS — E7800 Pure hypercholesterolemia, unspecified: Secondary | ICD-10-CM

## 2018-07-01 DIAGNOSIS — E6609 Other obesity due to excess calories: Secondary | ICD-10-CM

## 2018-07-01 DIAGNOSIS — E039 Hypothyroidism, unspecified: Secondary | ICD-10-CM | POA: Diagnosis not present

## 2018-07-01 DIAGNOSIS — F319 Bipolar disorder, unspecified: Secondary | ICD-10-CM | POA: Diagnosis not present

## 2018-07-01 DIAGNOSIS — M8580 Other specified disorders of bone density and structure, unspecified site: Secondary | ICD-10-CM | POA: Diagnosis not present

## 2018-07-01 DIAGNOSIS — J302 Other seasonal allergic rhinitis: Secondary | ICD-10-CM

## 2018-07-01 DIAGNOSIS — E559 Vitamin D deficiency, unspecified: Secondary | ICD-10-CM

## 2018-07-01 DIAGNOSIS — J3089 Other allergic rhinitis: Secondary | ICD-10-CM

## 2018-07-01 DIAGNOSIS — Z6833 Body mass index (BMI) 33.0-33.9, adult: Secondary | ICD-10-CM

## 2018-07-01 MED ORDER — MIRABEGRON ER 25 MG PO TB24: 25.0000 mg | ORAL_TABLET | Freq: Every day | ORAL | 11 refills | Status: DC

## 2018-07-01 MED ORDER — FLUTICASONE PROPIONATE 50 MCG/ACT NA SUSP: NASAL | 11 refills | Status: DC

## 2018-07-01 MED ORDER — FAMOTIDINE 20 MG PO TABS: 20.0000 mg | ORAL_TABLET | Freq: Two times a day (BID) | ORAL | 11 refills | Status: DC

## 2018-07-01 MED ORDER — LEVOTHYROXINE SODIUM 50 MCG PO TABS: ORAL_TABLET | ORAL | 11 refills | Status: DC

## 2018-07-01 MED ORDER — SIMVASTATIN 20 MG PO TABS: 20.0000 mg | ORAL_TABLET | Freq: Every day | ORAL | 11 refills | Status: DC

## 2018-07-01 NOTE — Assessment & Plan Note (Signed)
Reassuring dexa 5/19  Enc ca/D supplementation and exercise as tolerated  No falls or fx D level is therapeutic

## 2018-07-01 NOTE — Assessment & Plan Note (Signed)
Stable on current medicines under psychiatric care

## 2018-07-01 NOTE — Assessment & Plan Note (Signed)
Disc goals for lipids and reasons to control them Rev last labs with pt Rev low sat fat diet in detail Well controlled with simvastatin and diet  

## 2018-07-01 NOTE — Progress Notes (Signed)
Virtual Visit via Video Note  I connected with Lori Orozco on 07/01/18 at  2:30 PM EDT by a video enabled telemedicine application and verified that I am speaking with the correct person using two identifiers.  Location: Patient: home Provider: office    I discussed the limitations of evaluation and management by telemedicine and the availability of in person appointments. The patient expressed understanding and agreed to proceed.  History of Present Illness: Pt presents for annual f/u of chronic medical problems   AMW on 5/4 was without concern or gaps  Postponing mammogram due to the current pandemic   Weight is 210 Wt Readings from Last 3 Encounters:  07/01/18 210 lb (95.3 kg)  12/13/17 206 lb 3.2 oz (93.5 kg)  09/09/17 205 lb (93 kg)  she is taking care of herself  Eating well (perhaps too much) 32.89 kg/m  Smoking/vaping status  vaping for 6 y - no nicotine   Gyn care -she has appt with Dr Marice Potterove for June  Mammogram 4/19 Self breast exam -no lumps  Colonoscopy 3/12  Copd - stable/overall breathing is about the same Gets winded easily baseline No uri right now which is good  Hypothyroidism  Pt has no clinical changes- feels fine  No change in energy level/ hair or skin/ edema and no tremor Lab Results  Component Value Date   TSH 2.82 06/30/2018     Osteopenia -h/o  dexa 5/19 however showed bmd in the nl range  Taking vit D and just started calcium  D level 45.8  Mood in context of bipolar disorder More anxiety due to the pandemic  Otherwise mood is pretty good   Hyperlipidemia Lab Results  Component Value Date   CHOL 164 06/30/2018   CHOL 182 06/18/2017   CHOL 205 (H) 05/09/2016   Lab Results  Component Value Date   HDL 62.20 06/30/2018   HDL 77.80 06/18/2017   HDL 60.60 05/09/2016   Lab Results  Component Value Date   LDLCALC 79 06/30/2018   LDLCALC 93 06/18/2017   LDLCALC 114 (H) 05/09/2016   Lab Results  Component Value Date    TRIG 110.0 06/30/2018   TRIG 55.0 06/18/2017   TRIG 153.0 (H) 05/09/2016   Lab Results  Component Value Date   CHOLHDL 3 06/30/2018   CHOLHDL 2 06/18/2017   CHOLHDL 3 05/09/2016   No results found for: LDLDIRECT zocor and diet  Looking good  Avoids greasy foods and high fat foods    Other labs Lab Results  Component Value Date   CREATININE 0.99 06/30/2018   BUN 24 (H) 06/30/2018   NA 139 06/30/2018   K 4.4 06/30/2018   CL 102 06/30/2018   CO2 30 06/30/2018   Lab Results  Component Value Date   WBC 5.1 06/30/2018   HGB 12.2 06/30/2018   HCT 36.7 06/30/2018   MCV 95.2 06/30/2018   PLT 195.0 06/30/2018   Glucose of 89 Needs to drink more water  Diet mt dew   She is taking pepcid -it actually helps allergies more than most allergy medicines May help breathing as well   Review of Systems  Constitutional: Negative for chills, fever, malaise/fatigue and weight loss.  HENT: Negative for ear pain, hearing loss and sore throat.        Allergy rhinitis   Eyes: Negative for blurred vision.  Respiratory: Negative for cough, sputum production, shortness of breath and wheezing.   Cardiovascular: Negative for chest pain, palpitations and leg swelling.  Gastrointestinal: Negative for abdominal pain, heartburn, nausea and vomiting.  Genitourinary: Negative for frequency.  Musculoskeletal: Positive for joint pain. Negative for myalgias.  Skin: Negative for itching and rash.  Neurological: Negative for dizziness, tremors and headaches.  Endo/Heme/Allergies: Positive for environmental allergies. Negative for polydipsia.  Psychiatric/Behavioral: Negative for memory loss. The patient is nervous/anxious.        Depression is fairly stable      Patient Active Problem List   Diagnosis Date Noted  . Status post arthroscopy of left shoulder 01/16/2018  . Estrogen deficiency 06/28/2017  . Medial epicondylitis, left elbow 04/29/2017  . S/P arthroscopy of left shoulder 02/25/2017  .  Impingement syndrome of left shoulder 01/30/2017  . Chronic pain of right knee 11/12/2016  . Pedal edema 10/26/2016  . Right thigh pain 09/12/2016  . Trochanteric bursitis, right hip 04/10/2016  . Elevated BP without diagnosis of hypertension 09/02/2015  . Urge incontinence 07/27/2015  . Obesity 04/28/2015  . Need for hepatitis C screening test 04/26/2015  . Screening for HIV (human immunodeficiency virus) 04/26/2015  . Osteoarthritis of left knee 01/28/2015  . Status post total left knee replacement 01/28/2015  . Vitamin D deficiency 04/27/2014  . Seasonal and perennial allergic rhinitis 06/17/2013  . S/P total hip arthroplasty 03/20/2013  . 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 Encounter for Medicare annual wellness exam 11/18/2012  . History of falling 11/18/2012  . Routine general medical examination at a health care facility 11/10/2012  . 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 Osteopenia 10/19/2011  . Other screening mammogram 10/19/2011  . Hypothyroid 10/19/2011  . ANEMIA 10/11/2009  . PERIPHERAL NEUROPATHY, FEET 08/05/2009  . BACK PAIN 08/05/2009  . HAND PAIN, BILATERAL 08/05/2009  . TREMOR 03/09/2008  . MENOPAUSAL SYNDROME 10/09/2007  . DEPRESSION, MAJOR 05/20/2007  . BIPOLAR AFFECTIVE DISORDER 05/20/2007  . ANXIETY 05/20/2007  . 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 ESOPHAGEAL SPASM 05/20/2007  . HIATAL HERNIA 05/20/2007  . AMAUROSIS FUGAX 05/07/2007  . COPD mixed type (HCC) 03/27/2007  . HYPERCHOLESTEROLEMIA, PURE 12/10/2006  . SYMPTOM, SYNDROME, CHRONIC FATIGUE 12/10/2006   Past Medical History:  Diagnosis Date  . Amaurosis fugax   . Anemia    hx  . Anxiety   . Arthritis   . Asthma   . Cataract   . Chronic fatigue syndrome   . Chronic kidney disease    frequency, nephritis when 68 yrs old  . COPD (chronic obstructive pulmonary disease) (HCC)   . Depression   . Diverticulitis   . Emphysema of lung (HCC)   .  Fibromyalgia   . GERD (gastroesophageal reflux disease)    occ  . H/O hiatal hernia   . History of bronchitis   . Hyperlipidemia   . Hypothyroidism   . Interstitial cystitis   . Irritable bowel syndrome   . Lichen sclerosus   . Lumbar herniated disc   . Migraines   . Pneumonia   . PONV (postoperative nausea and vomiting)   . Shortness of breath    exertion   . Thyroid disease    Graves  . Urinary frequency   . Urinary tract infection   . Vertigo    Past Surgical History:  Procedure Laterality Date  . ABDOMINAL HYSTERECTOMY    . bladder sugery     . CAROTID DOPPLAR    . COLONOSCOPY  05/26/2010   avms- otherwise nl , re check 10y  . DEXA-OSTEOPENIA    . DOPPLER  ECHOCARDIOGRAPHY    . EPICONDYLITIS    . EYE SURGERY Bilateral    cataracts  . FOOT SURGERY Bilateral   . KNEE SURGERY     Left cartilage  . lipoma in second finger right hand    . PLANTAR FASCIA SURGERY Left   . ROTATOR CUFF REPAIR Left 12/2017  . TEMPOROMANDIBULAR JOINT SURGERY    . TONSILLECTOMY    . TOTAL HIP ARTHROPLASTY Right 03/20/2013   Procedure: Right TOTAL HIP ARTHROPLASTY;  Surgeon: [name], MD;  Location: [facility];  Service: Orthopedics;  Laterality: Right;  Right Total Hip Arthroplasty  . TOTAL KNEE ARTHROPLASTY Left 01/28/2015   Procedure: LEFT TOTAL KNEE ARTHROPLASTY;  Surgeon: [name], MD;  Location: [facility];  Service: Orthopedics;  Laterality: Left;  . TRIGGER FINGER RELEASE Right   . TROCHANTERIC BURSA EXCISION Right 05/03/2016   Dr. [name], [name]  . WRIST ARTHROSCOPY Right    ligament tear   Social History   Tobacco Use  . Smoking status: Former Smoker    Packs/day: 2.00    Years: 43.00    Pack years: 86.00    Types: Cigarettes    Last attempt to quit: 02/26/2013    Years since quitting: 5.3  . Smokeless tobacco: Never Used  . Tobacco comment: "vaping now"  Substance Use Topics  . Alcohol use: No    Alcohol/week: 0.0 standard drinks  . Drug use: No    Family History  Problem Relation Age of Onset  . Thyroid disease Mother   . Hypertension Mother   . Kidney disease Mother   . Cancer Father   . Colon cancer Other    Allergies  Allergen Reactions  . Lithium Anaphylaxis  . Tegretol [Carbamazepine] Other (See Comments)    Fever and body aches (fever over 103)  . Tricyclic Antidepressants Anaphylaxis  . Strawberry Extract Hives  . Codeine Nausea Only    Makes pt stay awake  . Cymbalta [Duloxetine Hcl] Other (See Comments)    Makes pt pass out   . Erythromycin     REACTION: abdominal pain  . Lyrica [Pregabalin]     Felt faint  . Neurontin [Gabapentin]     Passes  out  . Rabeprazole Sodium     REACTION: insomnia  . Duraprep [employer], Misc.] Itching and Rash    Tolerates Betadine   . Penicillins Rash    Has patient had a PCN reaction causing immediate rash, facial/tongue/throat swelling, SOB or lightheadedness with hypotension: Yes Has patient had a PCN reaction causing severe rash involving mucus membranes or skin necrosis: No Has patient had a PCN reaction that required hospitalization No Has patient had a PCN reaction occurring within the last 10 years: No If all of the above answers are "NO", then may proceed with Cephalosporin use.   . Tape Rash    PT ALLERGIC NYLON TAPE    Current Outpatient Medications on File Prior to Visit  Medication Sig Dispense Refill  . albuterol (PROAIR HFA) 108 (90 Base) MCG/ACT inhaler INHALE 2 PUFFS INTO THE LUNGS EVERY 4 HOURS AS NEEDED FOR WHEEZING 8.5 each 4  . baclofen (LIORESAL) 10 MG tablet TAKE 1 TABLET BY MOUTH EVERY 8 HOURS AS NEEDED FOR MUSCLE SPASMS 60 tablet 6  . buPROPion (WELLBUTRIN XL) 150 MG 24 hr tablet Take 150 mg by mouth daily.      . busPIRone (BUSPAR) 15 MG tablet Take 15 mg by mouth 2 (two) times daily before a meal.     .  Cholecalciferol (VITAMIN D3) 2000 UNITS TABS Take 2,000 Units by mouth daily.    . clobetasol cream (TEMOVATE) 0.05 % APPLY SMALL  AMOUNT TO AFFECTED AREAS 2 TO 3 TIMES PER WEEK 15 g 6  . cyanocobalamin 2000 MCG tablet Take 2,000 mcg by mouth daily.    . divalproex (DEPAKOTE ER) 250 MG 24 hr tablet Take 500 mg by mouth daily.    Marland Kitchen. HYDROcodone-acetaminophen (NORCO/VICODIN) 5-325 MG tablet Take 1 tablet by mouth every 6 (six) hours as needed for moderate pain. 30 tablet 0  . LORazepam (ATIVAN) 1 MG tablet Take 1 mg by mouth 2 (two) times daily as needed for anxiety.    . mirtazapine (REMERON) 30 MG tablet Take 30 mg by mouth at bedtime.     Marland Kitchen. PREMARIN 0.625 MG tablet Take 1 tablet (0.625 mg total) by mouth daily. 30 tablet 11  . sertraline (ZOLOFT) 100 MG tablet Take 100 mg by mouth daily.      No current facility-administered medications on file prior to visit.     Observations/Objective: Patient appears well and obese/baseline - like her normal self  Mood is good/not anxious  Mentally sharp and talkative  No facial swelling or asymmetry  Not hoarse  Does not clear throat or cough  No sob or wheezing during speech  Mildly tanned/baseline skin tone w/o lesions or rash   Assessment and Plan: Problem List Items Addressed This Visit      Respiratory   COPD mixed type (HCC)    Stable /still sob on exertion  No changes in tx  Continues to vape w/o nicotine -does not feel like she can stop this      Relevant Medications   fluticasone (FLONASE) 50 MCG/ACT nasal spray   Seasonal and perennial allergic rhinitis    Pt finds that pepcid helps otc 20 mg bid  Also allergen avoidance        Endocrine   Hypothyroid - Primary    Hypothyroidism  Pt has no clinical changes No change in energy level/ hair or skin/ edema and no tremor Lab Results  Component Value Date   TSH 2.82 06/30/2018          Relevant Medications   levothyroxine (SYNTHROID) 50 MCG tablet     Musculoskeletal and Integument   Osteopenia    Reassuring dexa 5/19  Enc ca/D supplementation and exercise as tolerated  No falls or fx D level is  therapeutic        Other   HYPERCHOLESTEROLEMIA, PURE    Disc goals for lipids and reasons to control them Rev last labs with pt Rev low sat fat diet in detail Well controlled with simvastatin and diet       Relevant Medications   simvastatin (ZOCOR) 20 MG tablet   BIPOLAR AFFECTIVE DISORDER    Stable on current medicines under psychiatric care      Vitamin D deficiency    In setting of bone loss hx  Level therapeutic in 40s Will continue current dosing      Obesity    Discussed how this problem influences overall health and the risks it imposes  Reviewed plan for weight loss with lower calorie diet (via better food choices and also portion control or program like weight watchers) and exercise building up to or more than 30 minutes 5 days per week including some aerobic activity             Follow Up Instructions:   No change in  current medications Be as active as you can  Avoid pollen during allergy season  Eat a healthy diet low in fat and processed carbs  Take care of yourself  Continue to quarantine during the pandemic  Get your mammogram when the imaging site opens  I discussed the assessment and treatment plan with the patient. The patient was provided an opportunity to ask questions and all were answered. The patient agreed with the plan and demonstrated an understanding of the instructions.   The patient was advised to call back or seek an in-person evaluation if the symptoms worsen or if the condition fails to improve as anticipated.     Roxy Manns, MD

## 2018-07-01 NOTE — Assessment & Plan Note (Signed)
Stable /still sob on exertion  No changes in tx  Continues to vape w/o nicotine -does not feel like she can stop this

## 2018-07-01 NOTE — Patient Instructions (Signed)
No change in current medications Be as active as you can  Avoid pollen during allergy season  Eat a healthy diet low in fat and processed carbs  Take care of yourself  Continue to quarantine during the pandemic  Get your mammogram when the imaging site opens

## 2018-07-01 NOTE — Assessment & Plan Note (Signed)
Hypothyroidism  Pt has no clinical changes No change in energy level/ hair or skin/ edema and no tremor Lab Results  Component Value Date   TSH 2.82 06/30/2018

## 2018-07-01 NOTE — Assessment & Plan Note (Signed)
Pt finds that pepcid helps otc 20 mg bid  Also allergen avoidance

## 2018-07-01 NOTE — Assessment & Plan Note (Signed)
Discussed how this problem influences overall health and the risks it imposes  Reviewed plan for weight loss with lower calorie diet (via better food choices and also portion control or program like weight watchers) and exercise building up to or more than 30 minutes 5 days per week including some aerobic activity    

## 2018-07-01 NOTE — Assessment & Plan Note (Signed)
In setting of bone loss hx  Level therapeutic in 40s Will continue current dosing

## 2018-07-02 ENCOUNTER — Ambulatory Visit (INDEPENDENT_AMBULATORY_CARE_PROVIDER_SITE_OTHER): Payer: Medicare Other | Admitting: Orthopaedic Surgery

## 2018-07-02 ENCOUNTER — Other Ambulatory Visit: Payer: Self-pay

## 2018-07-02 ENCOUNTER — Encounter: Payer: Self-pay | Admitting: Orthopaedic Surgery

## 2018-07-02 DIAGNOSIS — G8929 Other chronic pain: Secondary | ICD-10-CM | POA: Diagnosis not present

## 2018-07-02 DIAGNOSIS — M7702 Medial epicondylitis, left elbow: Secondary | ICD-10-CM | POA: Diagnosis not present

## 2018-07-02 DIAGNOSIS — M25512 Pain in left shoulder: Secondary | ICD-10-CM

## 2018-07-02 MED ORDER — METHYLPREDNISOLONE ACETATE 40 MG/ML IJ SUSP
40.0000 mg | INTRAMUSCULAR | Status: AC | PRN
  Administered 2018-07-02: 40 mg

## 2018-07-02 MED ORDER — LIDOCAINE HCL 1 % IJ SOLN
3.0000 mL | INTRAMUSCULAR | Status: AC | PRN
  Administered 2018-07-02: 3 mL

## 2018-07-02 MED ORDER — METHYLPREDNISOLONE ACETATE 40 MG/ML IJ SUSP
40.0000 mg | INTRAMUSCULAR | Status: AC | PRN
  Administered 2018-07-02: 40 mg via INTRA_ARTICULAR

## 2018-07-02 NOTE — Progress Notes (Signed)
Office Visit Note   Patient: Lori Orozco           Date of Birth: [birth year]           MRN: [id number] Visit Date: 07/02/2018              Requested by: Tower, Audrie GallusMarne A, MD [address] [place], [place] [id number] PCP: Judy Pimpleower, Marne A, MD   Assessment & Plan: Visit Diagnoses: No diagnosis found.  Plan: I did provide a steroid injection around the anterior lateral aspect of her left shoulder just off of the lateral edge of the acromion.  She tolerated this well.  Also provide a steroid injection over the medial epicondyle area.  At this point given the fact that she has had multiple injections over the medial epicondyle we will obtain an MRI of her left elbow to rule out any type of tear or other internal derangement that is causing her pain.  We will see her back when she has the MRI of her left elbow.  All question concerns were answered and addressed.  Follow-Up Instructions: She will call for follow-up when she has an MRI of her left elbow.  Orders:  No orders of the defined types were placed in this encounter.  No orders of the defined types were placed in this encounter.     Procedures: Large Joint Inj: L subacromial bursa on 07/02/2018 9:52 AM Indications: pain and diagnostic evaluation Details: 22 G 1.5 in needle  Arthrogram: No  Medications: 3 mL lidocaine 1 %; 40 mg methylPREDNISolone acetate 40 MG/ML Outcome: tolerated well, no immediate complications Procedure, treatment alternatives, risks and benefits explained, specific risks discussed. Consent was given by the patient. Immediately prior to procedure a time out was called to verify the correct patient, procedure, equipment, support staff and site/side marked as required. Patient was prepped and draped in the usual sterile fashion.   Hand/UE Inj: L elbow for medial epicondylitis on 07/02/2018 9:52 AM Medications: 3 mL lidocaine 1 %; 40 mg methylPREDNISolone acetate 40 MG/ML      Clinical Data: No  additional findings.   Subjective: Chief Complaint  Patient presents with  . Left Shoulder - Follow-up  The patient is well-known to me.  She has chronic left shoulder and left elbow issues.  She has had several arthroscopic interventions on her left shoulder and we were able to partially repair the rotator cuff tear several months ago.  She is having some pain in that shoulder and would like to have an injection more in the anterior to lateral aspect of the shoulder which I think is reasonable.  He is very active individual.  She is still plagued by medial epicondylar pain with medial epicondylitis at the left elbow that we have injected multiple times.  She like to have an injection that today as well.  It has been 3 months or more since her last injections.  She has had no other acute change in her medical status  HPI  Review of Systems She currently denies any headache, chest pain, shortness of breath, fever, chills, nausea, vomiting  Objective: Vital Signs: There were no vitals taken for this visit.  Physical Exam She is alert and orient x3 and in no acute distress Ortho Exam Examination of her left shoulder does show pain at the subacromial outlet into the lateral aspect of her shoulder with improved range of motion overall status post rotator cuff repair.  She is very tender over the  left elbow medial epicondyle but not over the cubital tunnel and she has a negative Tinel's sign over the cubital tunnel and good grip and pinch strength.  There is no numbness of her hand. Specialty Comments:  No specialty comments available.  Imaging: No results found.   PMFS History: Patient Active Problem List   Diagnosis Date Noted  . Status post arthroscopy of left shoulder 01/16/2018  . Estrogen deficiency 06/28/2017  . Medial epicondylitis, left elbow 04/29/2017  . S/P arthroscopy of left shoulder 02/25/2017  . Impingement syndrome of left shoulder 01/30/2017  . Chronic pain of right  knee 11/12/2016  . Pedal edema 10/26/2016  . Right thigh pain 09/12/2016  . Trochanteric bursitis, right hip 04/10/2016  . Elevated BP without diagnosis of hypertension 09/02/2015  . Urge incontinence 07/27/2015  . Obesity 04/28/2015  . Need for hepatitis C screening test 04/26/2015  . Screening for HIV (human immunodeficiency virus) 04/26/2015  . Osteoarthritis of left knee 01/28/2015  . Status post total left knee replacement 01/28/2015  . Vitamin D deficiency 04/27/2014  . Seasonal and perennial allergic rhinitis 06/17/2013  . S/P total hip arthroplasty 03/20/2013  . 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 Encounter for Medicare annual wellness exam 11/18/2012  . History of falling 11/18/2012  . Routine general medical examination at a health care facility 11/10/2012  . 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 Osteopenia 10/19/2011  . Other screening mammogram 10/19/2011  . Hypothyroid 10/19/2011  . ANEMIA 10/11/2009  . PERIPHERAL NEUROPATHY, FEET 08/05/2009  . BACK PAIN 08/05/2009  . HAND PAIN, BILATERAL 08/05/2009  . TREMOR 03/09/2008  . MENOPAUSAL SYNDROME 10/09/2007  . DEPRESSION, MAJOR 05/20/2007  . BIPOLAR AFFECTIVE DISORDER 05/20/2007  . ANXIETY 05/20/2007  . 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 ESOPHAGEAL SPASM 05/20/2007  . HIATAL HERNIA 05/20/2007  . AMAUROSIS FUGAX 05/07/2007  . COPD mixed type (HCC) 03/27/2007  . HYPERCHOLESTEROLEMIA, PURE 12/10/2006  . SYMPTOM, SYNDROME, CHRONIC FATIGUE 12/10/2006   Past Medical History:  Diagnosis Date  . Amaurosis fugax   . Anemia    hx  . Anxiety   . Arthritis   . Asthma   . Cataract   . Chronic fatigue syndrome   . Chronic kidney disease    frequency, nephritis when 68 yrs old  . COPD (chronic obstructive pulmonary disease) (HCC)   . Depression   . Diverticulitis   . Emphysema of lung (HCC)   . Fibromyalgia   . GERD (gastroesophageal reflux disease)    occ  . H/O hiatal  hernia   . History of bronchitis   . Hyperlipidemia   . Hypothyroidism   . Interstitial cystitis   . Irritable bowel syndrome   . Lichen sclerosus   . Lumbar herniated disc   . Migraines   . Pneumonia   . PONV (postoperative nausea and vomiting)   . Shortness of breath    exertion   . Thyroid disease    Graves  . Urinary frequency   . Urinary tract infection   . Vertigo     Family History  Problem Relation Age of Onset  . Thyroid disease Mother   . Hypertension Mother   . Kidney disease Mother   . Cancer Father   . Colon cancer Other     Past Surgical History:  Procedure Laterality Date  . ABDOMINAL HYSTERECTOMY    . bladder sugery     . CAROTID DOPPLAR    . COLONOSCOPY  05/26/2010   avms- otherwise nl , re check 10y  . DEXA-OSTEOPENIA    . DOPPLER ECHOCARDIOGRAPHY    . EPICONDYLITIS    . EYE SURGERY Bilateral    cataracts  . FOOT SURGERY Bilateral   . KNEE SURGERY     Left cartilage  . lipoma in second finger right hand    . PLANTAR FASCIA SURGERY Left   . ROTATOR CUFF REPAIR Left 12/2017  . TEMPOROMANDIBULAR JOINT SURGERY    . TONSILLECTOMY    . TOTAL HIP ARTHROPLASTY Right 03/20/2013   Procedure: Right TOTAL HIP ARTHROPLASTY;  Surgeon: [name], MD;  Location: [facility];  Service: Orthopedics;  Laterality: Right;  Right Total Hip Arthroplasty  . TOTAL KNEE ARTHROPLASTY Left 01/28/2015   Procedure: LEFT TOTAL KNEE ARTHROPLASTY;  Surgeon: [name], MD;  Location: [facility];  Service: Orthopedics;  Laterality: Left;  . TRIGGER FINGER RELEASE Right   . TROCHANTERIC BURSA EXCISION Right 05/03/2016   Dr. [name], [name]  . WRIST ARTHROSCOPY Right    ligament tear   Social History   Occupational History  . Not on file  Tobacco Use  . Smoking status: Former Smoker    Packs/day: 2.00    Years: 43.00    Pack years: 86.00    Types: Cigarettes    Last attempt to quit: 02/26/2013    Years since quitting: 5.3  . Smokeless tobacco: Never Used  .  Tobacco comment: "vaping now"  Substance and Sexual Activity  . Alcohol use: No    Alcohol/week: 0.0 standard drinks  . Drug use: No  . Sexual activity: Not Currently

## 2018-08-01 ENCOUNTER — Other Ambulatory Visit: Payer: Self-pay | Admitting: Family Medicine

## 2018-08-01 DIAGNOSIS — Z1231 Encounter for screening mammogram for malignant neoplasm of breast: Secondary | ICD-10-CM

## 2018-08-06 ENCOUNTER — Other Ambulatory Visit: Payer: Self-pay | Admitting: *Deleted

## 2018-08-06 DIAGNOSIS — M25522 Pain in left elbow: Secondary | ICD-10-CM

## 2018-08-21 ENCOUNTER — Encounter: Payer: Self-pay | Admitting: Obstetrics & Gynecology

## 2018-08-21 ENCOUNTER — Ambulatory Visit (INDEPENDENT_AMBULATORY_CARE_PROVIDER_SITE_OTHER): Payer: Medicare Other

## 2018-08-21 ENCOUNTER — Other Ambulatory Visit: Payer: Self-pay

## 2018-08-21 ENCOUNTER — Ambulatory Visit (INDEPENDENT_AMBULATORY_CARE_PROVIDER_SITE_OTHER): Payer: Medicare Other | Admitting: Obstetrics & Gynecology

## 2018-08-21 VITALS — BP 182/84 | HR 101 | Resp 16 | Ht 68.0 in | Wt 207.0 lb

## 2018-08-21 DIAGNOSIS — N9089 Other specified noninflammatory disorders of vulva and perineum: Secondary | ICD-10-CM | POA: Diagnosis not present

## 2018-08-21 DIAGNOSIS — Z1231 Encounter for screening mammogram for malignant neoplasm of breast: Secondary | ICD-10-CM | POA: Diagnosis not present

## 2018-08-21 NOTE — Progress Notes (Signed)
   Subjective:    Patient ID: Lori Orozco, female    DOB: 1950/03/21, 68 y.o.   MRN: [id number]  HPI  68 yo single P0 here for a vulva check. She has lichen sclerosis and uses temovate 2-3 days per week. She has no complaints today.  Review of Systems     Objective:   Physical Exam Breathing, conversing normally Well nourished, well hydrated White female, no apparent distress She has a scaly white patch on the inner portion of her right labia majora I prepped the area with Hurricane spray and betadine and then did a 3 mm punch biopsy. Silver nitrate was used to achieve hemostasis. She tolerated the procedure well.     Assessment & Plan:  Preventative care- mammogram today Vulvar lesion- await biopsy results

## 2018-08-23 ENCOUNTER — Other Ambulatory Visit: Payer: Self-pay

## 2018-08-23 ENCOUNTER — Ambulatory Visit
Admission: RE | Admit: 2018-08-23 | Discharge: 2018-08-23 | Disposition: A | Discharge status: Home / Self Care | Payer: Medicare Other | Source: Ambulatory Visit | Attending: Orthopaedic Surgery | Admitting: Orthopaedic Surgery

## 2018-08-23 DIAGNOSIS — M25522 Pain in left elbow: Secondary | ICD-10-CM

## 2018-08-25 ENCOUNTER — Other Ambulatory Visit: Payer: Self-pay | Admitting: Obstetrics & Gynecology

## 2018-08-25 DIAGNOSIS — R232 Flushing: Secondary | ICD-10-CM

## 2018-08-27 ENCOUNTER — Ambulatory Visit (INDEPENDENT_AMBULATORY_CARE_PROVIDER_SITE_OTHER): Payer: Medicare Other | Admitting: Orthopaedic Surgery

## 2018-08-27 ENCOUNTER — Other Ambulatory Visit: Payer: Self-pay

## 2018-08-27 ENCOUNTER — Encounter: Payer: Self-pay | Admitting: Orthopaedic Surgery

## 2018-08-27 DIAGNOSIS — M7702 Medial epicondylitis, left elbow: Secondary | ICD-10-CM

## 2018-08-27 NOTE — Progress Notes (Signed)
The patient comes in today to go over an MRI of her left elbow.  She is had chronic medial epicondylitis and after multiple injections over the years 1 to make sure there was nothing in terms of tendon tears that would require any other type of intervention.  She still denies any numbness and tingling and the pain is only over the medial epicondyle.  On exam her elbow motion is full.  She still been dealing with recovery from rotator cuff surgery.  This is on the left side as well.  She is 68 years old.  There is negative Tinel sign at the cubital tunnel.  Her elbow motion is full and her elbow is ligamentously stable.  MRI is reviewed with her and is a normal MRI of the left elbow.  At this point I recommended only Voltaren gel for her shoulder and her elbow.  There is really nothing else that I would recommend and I would not recommend continuing injections.  She is never had physical therapy on the elbow.  We can always proceed that route if needed however, some of the elbow problems may have flared up after therapy to increase her shoulder strength following her left rotator cuff surgery.  At this point she will just rest things.  All questions were answered addressed.  We will see her back in 3 months for repeat evaluation.

## 2018-09-09 ENCOUNTER — Encounter: Payer: Self-pay | Admitting: Physical Medicine and Rehabilitation

## 2018-09-09 ENCOUNTER — Telehealth: Payer: Self-pay | Admitting: *Deleted

## 2018-09-09 ENCOUNTER — Ambulatory Visit (INDEPENDENT_AMBULATORY_CARE_PROVIDER_SITE_OTHER): Payer: Medicare Other | Admitting: Physical Medicine and Rehabilitation

## 2018-09-09 ENCOUNTER — Ambulatory Visit (INDEPENDENT_AMBULATORY_CARE_PROVIDER_SITE_OTHER): Payer: Medicare Other

## 2018-09-09 ENCOUNTER — Other Ambulatory Visit: Payer: Self-pay

## 2018-09-09 VITALS — BP 160/81 | HR 78 | Ht 67.0 in | Wt 207.0 lb

## 2018-09-09 DIAGNOSIS — G8929 Other chronic pain: Secondary | ICD-10-CM

## 2018-09-09 DIAGNOSIS — M47816 Spondylosis without myelopathy or radiculopathy, lumbar region: Secondary | ICD-10-CM

## 2018-09-09 DIAGNOSIS — M545 Low back pain, unspecified: Secondary | ICD-10-CM

## 2018-09-09 DIAGNOSIS — M5136 Other intervertebral disc degeneration, lumbar region: Secondary | ICD-10-CM | POA: Diagnosis not present

## 2018-09-09 DIAGNOSIS — M7918 Myalgia, other site: Secondary | ICD-10-CM | POA: Diagnosis not present

## 2018-09-09 DIAGNOSIS — M797 Fibromyalgia: Secondary | ICD-10-CM

## 2018-09-09 MED ORDER — BACLOFEN 10 MG PO TABS: ORAL_TABLET | ORAL | 6 refills | Status: DC

## 2018-09-09 NOTE — Progress Notes (Signed)
 .  Numeric Pain Rating Scale and Functional Assessment Average Pain 10 Pain Right Now 10 My pain is constant and sharp Pain is worse with: walking and standing Pain improves with: medication   In the last MONTH (on 0-10 scale) has pain interfered with the following?  1. General activity like being  able to carry out your everyday physical activities such as walking, climbing stairs, carrying groceries, or moving a chair?  Rating(7)  2. Relation with others like being able to carry out your usual social activities and roles such as  activities at home, at work and in your community. Rating(7)  3. Enjoyment of life such that you have  been bothered by emotional problems such as feeling anxious, depressed or irritable?  Rating(3)

## 2018-09-10 ENCOUNTER — Encounter: Payer: Self-pay | Admitting: Physical Medicine and Rehabilitation

## 2018-09-10 NOTE — Progress Notes (Signed)
Lori Orozco - 68 y.o. female MRN [id number]  Date of birth: 1950/04/12  Office Visit Note: Visit Date: 09/09/2018 PCP: Abner Greenspan, MD Referred by: Tower, Wynelle Fanny, MD  Subjective: Chief Complaint  Patient presents with  . Lower Back - Pain   HPI: Lori Orozco is a 68 y.o. female who comes in today For evaluation management of worsening low back pain this been ongoing now for about 3 months of worsening.  She really shows me this to be more of the upper lumbar region worse with standing at one spot somewhat worse with walking.  She does use ice and baclofen with some relief.  She reports her pain is 10 out of 10 and constant and sharp.  She denies any radicular pain at all no pain into the buttock region.  No pain into the hips.  No focal weakness or other red flag complaints.  No unexplained weight loss no trauma.  We have actually been seeing the patient off and on for many years.  She is followed by Dr. Ninfa Linden and Dr. Sharol Given in the office.  She has had prior right total hip arthroplasty and prior left total knee arthroplasty.  We initially saw her and completed facet joint blocks which were very beneficial initially.  Then there was a point where she would have just intermittent flareups and we did provide baclofen as a muscle relaxer that really seemed to help her quite a bit.  The last time I saw the patient was in 2019 in January so over a year and a half ago.  At that point we just refilled her baclofen and she was doing well.  This time she reports the baclofen has not helped as much and she really has had chronic worsening pain now for several months.  She has had physical therapy in the past but not recently.  Review of Systems  Constitutional: Negative for chills, fever, malaise/fatigue and weight loss.  HENT: Negative for hearing loss and sinus pain.   Eyes: Negative for blurred vision, double vision and photophobia.  Respiratory: Negative for cough and shortness of  breath.   Cardiovascular: Negative for chest pain, palpitations and leg swelling.  Gastrointestinal: Negative for abdominal pain, nausea and vomiting.  Genitourinary: Negative for flank pain.  Musculoskeletal: Positive for back pain and joint pain. Negative for myalgias.  Skin: Negative for itching and rash.  Neurological: Negative for tremors, focal weakness and weakness.  Endo/Heme/Allergies: Negative.   Psychiatric/Behavioral: Negative for depression.  All other systems reviewed and are negative.  Otherwise per HPI.  Assessment & Plan: Visit Diagnoses:  1. Chronic bilateral low back pain without sciatica   2. Spondylosis without myelopathy or radiculopathy, lumbar region   3. Other intervertebral disc degeneration, lumbar region   4. Myofascial pain syndrome   5. Fibromyalgia     Plan: Findings:  Chronic history of axial low back pain which is felt to have been facet mediated low back pain that was helpful diagnostically with medial branch/facet joint blocks many years ago.  Patient's been maintained on muscle relaxer of baclofen and doing quite well with that and activity modification and exercises.  Her case is complicated by history of anxiety depression and intolerance to a lot of psychotropic type medications.  She is also had multiple orthopedic problems and surgeries over the years.  I think at this point the flareup of pain is related to potentially upper facet joint issues and lower facet joint issues consistent on  exam with extension and facet loading.  She also has x-ray changes at L1-2 of more of a degenerative nature and this could be somewhat disc related although I think that is probably unlikely at this point.  She has no real symptoms that would necessitate MRI of the lumbar spine.  Depending on outcome how long the pain is lasting I would look at MRI of the lumbar spine because of her age and just ongoing back pain and the fact that this has not been repeated in a while.   X-rays were completed and reviewed with the patient below.  We will refill her baclofen and complete diagnostic upper lumbar facet joint block.  She would be a good candidate for ablation depending on longevity of relief.    Meds & Orders:  Meds ordered this encounter  Medications  . baclofen (LIORESAL) 10 MG tablet    Sig: TAKE 1 TABLET BY MOUTH EVERY 8 HOURS AS NEEDED FOR MUSCLE SPASMS    Dispense:  60 tablet    Refill:  6    Orders Placed This Encounter  Procedures  . XR Lumbar Spine 2-3 Views    Follow-up: Return for Diagnostic facet joint blocks at L1-2 L2-3.Marland Kitchen.   Procedures: No procedures performed  No notes on file   Clinical History: No specialty comments available.   She reports that she quit smoking about 5 years ago. Her smoking use included cigarettes. She has a 86.00 pack-year smoking history. She has never used smokeless tobacco. No results for input(s): HGBA1C, LABURIC in the last 8760 hours.  Objective:  VS:  HT:5\' 7"  (170.2 cm)   WT:207 lb (93.9 kg)  BMI:32.41    BP:(!) 160/81  HR:78bpm  TEMP: ( )  RESP:  Physical Exam Vitals signs and nursing note reviewed.  Constitutional:      General: She is not in acute distress.    Appearance: Normal appearance. She is well-developed.  HENT:     Head: Normocephalic and atraumatic.     Nose: Nose normal.     Mouth/Throat:     Mouth: Mucous membranes are moist.     Pharynx: Oropharynx is clear.  Eyes:     Conjunctiva/sclera: Conjunctivae normal.     Pupils: Pupils are equal, round, and reactive to light.  Neck:     Musculoskeletal: Normal range of motion and neck supple.  Cardiovascular:     Rate and Rhythm: Regular rhythm.  Pulmonary:     Effort: Pulmonary effort is normal. No respiratory distress.  Abdominal:     General: There is no distension.     Palpations: Abdomen is soft.     Tenderness: There is no guarding.  Musculoskeletal:     Right lower leg: No edema.     Left lower leg: No edema.      Comments: Patient ambulates without aid she is slow to rise from seated position to full extension and does have concordant back pain with extension and facet loading.  She does have tenderness across the entire lower back with somewhat light palpation of the musculature.  No focal deep trigger points noted.  She does have good distal strength in the lower extremities and no clonus.  No pain over the greater trochanters no pain with hip rotation.  Skin:    General: Skin is warm and dry.     Findings: No erythema or rash.  Neurological:     General: No focal deficit present.     Mental Status: She is  alert and oriented to person, place, and time.     Sensory: No sensory deficit.     Motor: No abnormal muscle tone.     Coordination: Coordination normal.     Gait: Gait normal.  Psychiatric:        Mood and Affect: Mood normal.        Behavior: Behavior normal.        Thought Content: Thought content normal.     Ortho Exam Imaging: Xr Lumbar Spine 2-3 Views  Result Date: 09/10/2018 AP and lateral lumbar spine show fairly normal anatomic alignment AP and lateral without listhesis.  There is degenerative changes of the disc particularly at L1-2 but otherwise disc heights are well-maintained throughout.  There is facet arthropathy right more than left L4-5 and L5-S1.  There is status post right total hip arthroplasty.  Left hip appears to have only minimal degenerative change.   Past Medical/Family/Surgical/Social History: Medications & Allergies reviewed per EMR, new medications updated. Patient Active Problem List   Diagnosis Date Noted  . Status post arthroscopy of left shoulder 01/16/2018  . Estrogen deficiency 06/28/2017  . Medial epicondylitis, left elbow 04/29/2017  . S/P arthroscopy of left shoulder 02/25/2017  . Impingement syndrome of left shoulder 01/30/2017  . Chronic pain of right knee 11/12/2016  . Pedal edema 10/26/2016  . Right thigh pain 09/12/2016  . Trochanteric  bursitis, right hip 04/10/2016  . Elevated BP without diagnosis of hypertension 09/02/2015  . Urge incontinence 07/27/2015  . Obesity 04/28/2015  . Need for hepatitis C screening test 04/26/2015  . Screening for HIV (human immunodeficiency virus) 04/26/2015  . Osteoarthritis of left knee 01/28/2015  . Status post total left knee replacement 01/28/2015  . Vitamin D deficiency 04/27/2014  . Seasonal and perennial allergic rhinitis 06/17/2013  . S/P total hip arthroplasty 03/20/2013  . 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 Encounter for Medicare annual wellness exam 11/18/2012  . History of falling 11/18/2012  . Routine general medical examination at a health care facility 11/10/2012  . 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 Osteopenia 10/19/2011  . Other screening mammogram 10/19/2011  . Hypothyroid 10/19/2011  . ANEMIA 10/11/2009  . PERIPHERAL NEUROPATHY, FEET 08/05/2009  . BACK PAIN 08/05/2009  . HAND PAIN, BILATERAL 08/05/2009  . TREMOR 03/09/2008  . MENOPAUSAL SYNDROME 10/09/2007  . DEPRESSION, MAJOR 05/20/2007  . BIPOLAR AFFECTIVE DISORDER 05/20/2007  . ANXIETY 05/20/2007  . 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 ESOPHAGEAL SPASM 05/20/2007  . HIATAL HERNIA 05/20/2007  . AMAUROSIS FUGAX 05/07/2007  . COPD mixed type (HCC) 03/27/2007  . HYPERCHOLESTEROLEMIA, PURE 12/10/2006  . SYMPTOM, SYNDROME, CHRONIC FATIGUE 12/10/2006   Past Medical History:  Diagnosis Date  . Amaurosis fugax   . Anemia    hx  . Anxiety   . Arthritis   . Asthma   . Cataract   . Chronic fatigue syndrome   . Chronic kidney disease    frequency, nephritis when 68 yrs old  . COPD (chronic obstructive pulmonary disease) (HCC)   . Depression   . Diverticulitis   . Emphysema of lung (HCC)   . Fibromyalgia   . GERD (gastroesophageal reflux disease)    occ  . H/O hiatal hernia   . History of bronchitis   . Hyperlipidemia   . Hypothyroidism   . Interstitial  cystitis   . Irritable bowel syndrome   . Lichen sclerosus   . Lumbar herniated disc   . Migraines   .  Pneumonia   . PONV (postoperative nausea and vomiting)   . Shortness of breath    exertion   . Thyroid disease    [place]  . Urinary frequency   . Urinary tract infection   . Vertigo    Family History  Problem Relation Age of Onset  . Thyroid disease Mother   . Hypertension Mother   . Kidney disease Mother   . Cancer Father   . Colon cancer Other    Past Surgical History:  Procedure Laterality Date  . ABDOMINAL HYSTERECTOMY    . bladder sugery     . CAROTID DOPPLAR    . COLONOSCOPY  05/26/2010   avms- otherwise nl , re check 10y  . DEXA-OSTEOPENIA    . DOPPLER ECHOCARDIOGRAPHY    . EPICONDYLITIS    . EYE SURGERY Bilateral    cataracts  . FOOT SURGERY Bilateral   . KNEE SURGERY     Left cartilage  . lipoma in second finger right hand    . PLANTAR FASCIA SURGERY Left   . ROTATOR CUFF REPAIR Left 12/2017  . TEMPOROMANDIBULAR JOINT SURGERY    . TONSILLECTOMY    . TOTAL HIP ARTHROPLASTY Right 03/20/2013   Procedure: Right TOTAL HIP ARTHROPLASTY;  Surgeon: [name], MD;  Location: [facility] OR;  Service: Orthopedics;  Laterality: Right;  Right Total Hip Arthroplasty  . TOTAL KNEE ARTHROPLASTY Left 01/28/2015   Procedure: LEFT TOTAL KNEE ARTHROPLASTY;  Surgeon: [name], MD;  Location: [facility];  Service: Orthopedics;  Laterality: Left;  . TRIGGER FINGER RELEASE Right   . TROCHANTERIC BURSA EXCISION Right 05/03/2016   Dr. [name], [name]  . WRIST ARTHROSCOPY Right    ligament tear   Social History   Occupational History  . Not on file  Tobacco Use  . Smoking status: Former Smoker    Packs/day: 2.00    Years: 43.00    Pack years: 86.00    Types: Cigarettes    Quit date: 02/26/2013    Years since quitting: 5.5  . Smokeless tobacco: Never Used  . Tobacco comment: "vaping now"  Substance and Sexual Activity  . Alcohol use: No    Alcohol/week: 0.0  standard drinks  . Drug use: No  . Sexual activity: Not Currently

## 2018-09-25 ENCOUNTER — Ambulatory Visit: Payer: Self-pay

## 2018-09-25 ENCOUNTER — Encounter: Payer: Self-pay | Admitting: Physical Medicine and Rehabilitation

## 2018-09-25 ENCOUNTER — Ambulatory Visit (INDEPENDENT_AMBULATORY_CARE_PROVIDER_SITE_OTHER): Payer: Medicare Other | Admitting: Physical Medicine and Rehabilitation

## 2018-09-25 ENCOUNTER — Other Ambulatory Visit: Payer: Self-pay

## 2018-09-25 VITALS — BP 134/77 | HR 81

## 2018-09-25 DIAGNOSIS — M47816 Spondylosis without myelopathy or radiculopathy, lumbar region: Secondary | ICD-10-CM | POA: Diagnosis not present

## 2018-09-25 DIAGNOSIS — G8929 Other chronic pain: Secondary | ICD-10-CM

## 2018-09-25 NOTE — Progress Notes (Signed)
.  Numeric Pain Rating Scale and Functional Assessment Average Pain 9   In the last MONTH (on 0-10 scale) has pain interfered with the following?  1. General activity like being  able to carry out your everyday physical activities such as walking, climbing stairs, carrying groceries, or moving a chair?  Rating(8)   +Driver, -BT, -Dye Allergies.  

## 2018-09-29 ENCOUNTER — Telehealth: Payer: Self-pay | Admitting: Physical Medicine and Rehabilitation

## 2018-09-29 DIAGNOSIS — G8929 Other chronic pain: Secondary | ICD-10-CM | POA: Diagnosis not present

## 2018-09-29 DIAGNOSIS — M47816 Spondylosis without myelopathy or radiculopathy, lumbar region: Secondary | ICD-10-CM | POA: Diagnosis not present

## 2018-09-29 DIAGNOSIS — M545 Low back pain: Secondary | ICD-10-CM

## 2018-09-29 MED ORDER — METHYLPREDNISOLONE ACETATE 80 MG/ML IJ SUSP
80.0000 mg | Freq: Once | INTRAMUSCULAR | Status: AC
  Administered 2018-09-29: 80 mg

## 2018-09-29 NOTE — Procedures (Signed)
Lumbar Facet Joint Intra-Articular Injection(s) with Fluoroscopic Guidance  Patient: Lori Orozco      Date of Birth: Jul 11, 1950 MRN: [id number] PCP: Abner Greenspan, MD      Visit Date: 09/25/2018   Universal Protocol:    Date/Time: 09/25/2018  Consent Given By: the patient  Position: PRONE   Additional Comments: Vital signs were monitored before and after the procedure. Patient was prepped and draped in the usual sterile fashion. The correct patient, procedure, and site was verified.   Injection Procedure Details:  Procedure Site One Meds Administered:  Meds ordered this encounter  Medications  . methylPREDNISolone acetate (DEPO-MEDROL) injection 80 mg     Laterality: Bilateral  Location/Site:  L1-L2  Needle size: 22 guage  Needle type: Spinal  Needle Placement: Articular  Findings:  -Comments: Excellent flow contrast with articulate pillars without intravascular flow  Procedure Details: The fluoroscope beam is vertically oriented in AP, and the inferior recess is visualized beneath the lower pole of the inferior apophyseal process, which represents the target point for needle insertion. When direct visualization is difficult the target point is located at the medial projection of the vertebral pedicle. The region overlying each aforementioned target is locally anesthetized with a 1 to 2 ml. volume of 1% Lidocaine without Epinephrine.   The spinal needle was inserted into each of the above mentioned facet joints using biplanar fluoroscopic guidance. A 0.25 to 0.5 ml. volume of Isovue-250 was injected and a partial facet joint arthrogram was obtained. A single spot film was obtained of the resulting arthrogram.    One to 1.25 ml of the steroid/anesthetic solution was then injected into each of the facet joints noted above.   Additional Comments:  The patient tolerated the procedure well Dressing: 2 x 2 sterile gauze and Band-Aid    Post-procedure  details: Patient was observed during the procedure. Post-procedure instructions were reviewed.  Patient left the clinic in stable condition.

## 2018-09-29 NOTE — Telephone Encounter (Signed)
First of all I am seeing you name 101.4 and not 110.4.  If it was just fever and she was having increasing back pain I think we should get some lab work or see here in the office.  The fact that she is having nausea and vomiting with this I think this is something different.  She may want to see her primary care physician.  If it is self-limiting with some Tylenol and increased fluids and watching I think that is fine as well.  If her temperature does go over 101.5 or if she has other increasing symptoms that I think she definitely should see her primary care physician.  The steroid medication could give somebody a very low grade increased temp but not really specifically a fever.  If she is really concerned I would order blood work which would be a CBC with differential and ESR and CRP

## 2018-09-29 NOTE — Telephone Encounter (Signed)
Yes. Fever is 100.3. I called patient to advise. She will call her PCP and let us know if she needs anything from [place] such as lab orders or an OV.

## 2018-09-29 NOTE — Progress Notes (Signed)
   SLYVIA LARTIGUE - 68 y.o. female MRN [id number]  Date of birth: 06-04-1950  Office Visit Note: Visit Date: 09/25/2018 PCP: Abner Greenspan, MD Referred by: Tower, Wynelle Fanny, MD  Subjective: Chief Complaint  Patient presents with  . Lower Back - Pain   HPI:  Lori Orozco is a 68 y.o. female who comes in today For planned L1-2 facet joint blocks/medial branch block.  This is for upper lumbar back pain as noted on her prior office visit note.  ROS Otherwise per HPI.  Assessment & Plan: Visit Diagnoses:  1. Spondylosis without myelopathy or radiculopathy, lumbar region   2. Chronic bilateral low back pain without sciatica     Plan: No additional findings.   Meds & Orders:  Meds ordered this encounter  Medications  . methylPREDNISolone acetate (DEPO-MEDROL) injection 80 mg    Orders Placed This Encounter  Procedures  . Facet Injection  . XR C-ARM NO REPORT    Follow-up: Return if symptoms worsen or fail to improve.   Procedures: No procedures performed  Lumbar Facet Joint Intra-Articular Injection(s) with Fluoroscopic Guidance  Patient: Lori Orozco      Date of Birth: 1950-03-09 MRN: [id number] PCP: Abner Greenspan, MD      Visit Date: 09/25/2018   Universal Protocol:    Date/Time: 09/25/2018  Consent Given By: the patient  Position: PRONE   Additional Comments: Vital signs were monitored before and after the procedure. Patient was prepped and draped in the usual sterile fashion. The correct patient, procedure, and site was verified.   Injection Procedure Details:  Procedure Site One Meds Administered:  Meds ordered this encounter  Medications  . methylPREDNISolone acetate (DEPO-MEDROL) injection 80 mg     Laterality: Bilateral  Location/Site:  L1-L2  Needle size: 22 guage  Needle type: Spinal  Needle Placement: Articular  Findings:  -Comments: Excellent flow contrast with articulate pillars without intravascular flow   Procedure Details: The fluoroscope beam is vertically oriented in AP, and the inferior recess is visualized beneath the lower pole of the inferior apophyseal process, which represents the target point for needle insertion. When direct visualization is difficult the target point is located at the medial projection of the vertebral pedicle. The region overlying each aforementioned target is locally anesthetized with a 1 to 2 ml. volume of 1% Lidocaine without Epinephrine.   The spinal needle was inserted into each of the above mentioned facet joints using biplanar fluoroscopic guidance. A 0.25 to 0.5 ml. volume of Isovue-250 was injected and a partial facet joint arthrogram was obtained. A single spot film was obtained of the resulting arthrogram.    One to 1.25 ml of the steroid/anesthetic solution was then injected into each of the facet joints noted above.   Additional Comments:  The patient tolerated the procedure well Dressing: 2 x 2 sterile gauze and Band-Aid    Post-procedure details: Patient was observed during the procedure. Post-procedure instructions were reviewed.  Patient left the clinic in stable condition.    Clinical History: No specialty comments available.     Objective:  VS:  HT:    WT:   BMI:     BP:134/77  HR:81bpm  TEMP: ( )  RESP:  Physical Exam  Ortho Exam Imaging: No results found.

## 2018-09-30 ENCOUNTER — Ambulatory Visit (INDEPENDENT_AMBULATORY_CARE_PROVIDER_SITE_OTHER): Payer: Medicare Other | Admitting: Family Medicine

## 2018-09-30 ENCOUNTER — Other Ambulatory Visit: Payer: Self-pay

## 2018-09-30 ENCOUNTER — Encounter: Payer: Self-pay | Admitting: Family Medicine

## 2018-09-30 DIAGNOSIS — R509 Fever, unspecified: Secondary | ICD-10-CM | POA: Diagnosis not present

## 2018-09-30 DIAGNOSIS — R112 Nausea with vomiting, unspecified: Secondary | ICD-10-CM | POA: Insufficient documentation

## 2018-09-30 DIAGNOSIS — Z20822 Contact with and (suspected) exposure to covid-19: Secondary | ICD-10-CM

## 2018-09-30 NOTE — Patient Instructions (Addendum)
Switch to tylenol for fever   Watch for abdominal pain, diarrhea, higher fever or cough/ shortness of breath  Go ahead to [facility] in [place]   Update if not starting to improve in a week or if worsening

## 2018-09-30 NOTE — Assessment & Plan Note (Signed)
With low grade fever No diarrhea or abd pain  Gradually getting better (now just mild nausea) Disc strategy to re hydrate  Lori Orozco diet- adv gradually Declined nausea medication  covid test ordered-she will go get today Update if not starting to improve in a week or if worsening

## 2018-09-30 NOTE — Progress Notes (Signed)
Virtual Visit via Video Note  I connected with Lori Orozco on 09/30/18 at 12:00 PM EDT by a video enabled telemedicine application and verified that I am speaking with the correct person using two identifiers.  Location: Patient: home Provider: office    I discussed the limitations of evaluation and management by telemedicine and the availability of in person appointments. The patient expressed understanding and agreed to proceed.  History of Present Illness: Pt presents with fever and vomiting for 4 days   Last Thursday had epidural with Dr Alvester MorinNewton (4 shots in her back) Thursday night/fri am -got nauseated and started vomiting  By Friday the dry heaves Then fever started  Temp up to 100.3  Last night was fever free  Low grade this am   Nausea (a little queasy when up moving around)=mild No longer vomiting  No abd pain  No appetite  No diarrhea   Taking ibuprofen for fever  Can take tylenol instead  Has chills   Drinking a lot  Nibbling -bland foods - small amounts   No cough  No change in breathing  No loss of taste or smell   No cold symptoms   No sick contacts  Not going out and not letting anyone go in her house   Last thing eaten - cabbage/salmon/peas/corn bread   She took dramamine for nausea  Then some pepto bismol  Nothing really helped    Back is still sore  She called Dr Franklin BlasNewton's office Monday  Did not think it was related to back injections   Patient Active Problem List   Diagnosis Date Noted  . Nausea & vomiting 09/30/2018  . Fever 09/30/2018  . Status post arthroscopy of left shoulder 01/16/2018  . Estrogen deficiency 06/28/2017  . Medial epicondylitis, left elbow 04/29/2017  . S/P arthroscopy of left shoulder 02/25/2017  . Impingement syndrome of left shoulder 01/30/2017  . Chronic pain of right knee 11/12/2016  . Pedal edema 10/26/2016  . Right thigh pain 09/12/2016  . Trochanteric bursitis, right hip 04/10/2016  . Elevated BP  without diagnosis of hypertension 09/02/2015  . Urge incontinence 07/27/2015  . Obesity 04/28/2015  . Need for hepatitis C screening test 04/26/2015  . Screening for HIV (human immunodeficiency virus) 04/26/2015  . Osteoarthritis of left knee 01/28/2015  . Status post total left knee replacement 01/28/2015  . Vitamin D deficiency 04/27/2014  . Seasonal and perennial allergic rhinitis 06/17/2013  . S/P total hip arthroplasty 03/20/2013  . 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 Encounter for Medicare annual wellness exam 11/18/2012  . History of falling 11/18/2012  . Routine general medical examination at a health care facility 11/10/2012  . 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 Osteopenia 10/19/2011  . Other screening mammogram 10/19/2011  . Hypothyroid 10/19/2011  . ANEMIA 10/11/2009  . PERIPHERAL NEUROPATHY, FEET 08/05/2009  . BACK PAIN 08/05/2009  . HAND PAIN, BILATERAL 08/05/2009  . TREMOR 03/09/2008  . MENOPAUSAL SYNDROME 10/09/2007  . DEPRESSION, MAJOR 05/20/2007  . BIPOLAR AFFECTIVE DISORDER 05/20/2007  . ANXIETY 05/20/2007  . 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 ESOPHAGEAL SPASM 05/20/2007  . HIATAL HERNIA 05/20/2007  . AMAUROSIS FUGAX 05/07/2007  . COPD mixed type (HCC) 03/27/2007  . HYPERCHOLESTEROLEMIA, PURE 12/10/2006  . SYMPTOM, SYNDROME, CHRONIC FATIGUE 12/10/2006   Past Medical History:  Diagnosis Date  . Amaurosis fugax   . Anemia    hx  . Anxiety   . Arthritis   .  Asthma   . Cataract   . Chronic fatigue syndrome   . Chronic kidney disease    frequency, nephritis when 68 yrs old  . COPD (chronic obstructive pulmonary disease) (HCC)   . Depression   . Diverticulitis   . Emphysema of lung (HCC)   . Fibromyalgia   . GERD (gastroesophageal reflux disease)    occ  . H/O hiatal hernia   . History of bronchitis   . Hyperlipidemia   . Hypothyroidism   . Interstitial cystitis   . Irritable bowel syndrome   . Lichen  sclerosus   . Lumbar herniated disc   . Migraines   . Pneumonia   . PONV (postoperative nausea and vomiting)   . Shortness of breath    exertion   . Thyroid disease    Graves  . Urinary frequency   . Urinary tract infection   . Vertigo    Past Surgical History:  Procedure Laterality Date  . ABDOMINAL HYSTERECTOMY    . bladder sugery     . CAROTID DOPPLAR    . COLONOSCOPY  05/26/2010   avms- otherwise nl , re check 10y  . DEXA-OSTEOPENIA    . DOPPLER ECHOCARDIOGRAPHY    . EPICONDYLITIS    . EYE SURGERY Bilateral    cataracts  . FOOT SURGERY Bilateral   . KNEE SURGERY     Left cartilage  . lipoma in second finger right hand    . PLANTAR FASCIA SURGERY Left   . ROTATOR CUFF REPAIR Left 12/2017  . TEMPOROMANDIBULAR JOINT SURGERY    . TONSILLECTOMY    . TOTAL HIP ARTHROPLASTY Right 03/20/2013   Procedure: Right TOTAL HIP ARTHROPLASTY;  Surgeon: [name], MD;  Location: [facility] OR;  Service: Orthopedics;  Laterality: Right;  Right Total Hip Arthroplasty  . TOTAL KNEE ARTHROPLASTY Left 01/28/2015   Procedure: LEFT TOTAL KNEE ARTHROPLASTY;  Surgeon: [name], MD;  Location: [facility];  Service: Orthopedics;  Laterality: Left;  . TRIGGER FINGER RELEASE Right   . TROCHANTERIC BURSA EXCISION Right 05/03/2016   Dr. [name], [name]  . WRIST ARTHROSCOPY Right    ligament tear   Social History   Tobacco Use  . Smoking status: Former Smoker    Packs/day: 2.00    Years: 43.00    Pack years: 86.00    Types: Cigarettes    Quit date: 02/26/2013    Years since quitting: 5.5  . Smokeless tobacco: Never Used  . Tobacco comment: "vaping now"  Substance Use Topics  . Alcohol use: No    Alcohol/week: 0.0 standard drinks  . Drug use: No   Family History  Problem Relation Age of Onset  . Thyroid disease Mother   . Hypertension Mother   . Kidney disease Mother   . Cancer Father   . Colon cancer Other    Allergies  Allergen Reactions  . Lithium Anaphylaxis  .  Tegretol [Carbamazepine] Other (See Comments)    Fever and body aches (fever over 103)  . Tricyclic Antidepressants Anaphylaxis  . Strawberry Extract Hives  . Codeine Nausea Only    Makes pt stay awake  . Cymbalta [Duloxetine Hcl] Other (See Comments)    Makes pt pass out   . Erythromycin     REACTION: abdominal pain  . Lyrica [Pregabalin]     Felt faint  . Neurontin [Gabapentin]     Passes  out  . Rabeprazole Sodium     REACTION: insomnia  . Duraprep [employer],  Misc.] Itching and Rash    Tolerates Betadine   . Penicillins Rash    Has patient had a PCN reaction causing immediate rash, facial/tongue/throat swelling, SOB or lightheadedness with hypotension: Yes Has patient had a PCN reaction causing severe rash involving mucus membranes or skin necrosis: No Has patient had a PCN reaction that required hospitalization No Has patient had a PCN reaction occurring within the last 10 years: No If all of the above answers are "NO", then may proceed with Cephalosporin use.   . Tape Rash    PT ALLERGIC NYLON TAPE    Current Outpatient Medications on File Prior to Visit  Medication Sig Dispense Refill  . albuterol (PROAIR HFA) 108 (90 Base) MCG/ACT inhaler INHALE 2 PUFFS INTO THE LUNGS EVERY 4 HOURS AS NEEDED FOR WHEEZING 8.5 each 4  . baclofen (LIORESAL) 10 MG tablet TAKE 1 TABLET BY MOUTH EVERY 8 HOURS AS NEEDED FOR MUSCLE SPASMS 60 tablet 6  . buPROPion (WELLBUTRIN XL) 150 MG 24 hr tablet Take 150 mg by mouth daily.      . busPIRone (BUSPAR) 15 MG tablet Take 15 mg by mouth 2 (two) times daily before a meal.     . Cholecalciferol (VITAMIN D3) 2000 UNITS TABS Take 2,000 Units by mouth daily.    . clobetasol cream (TEMOVATE) 0.05 % APPLY SMALL AMOUNT TO AFFECTED AREAS 2 TO 3 TIMES PER WEEK 15 g 6  . cyanocobalamin 2000 MCG tablet Take 2,000 mcg by mouth daily.    . divalproex (DEPAKOTE ER) 250 MG 24 hr tablet Take 500 mg by mouth daily.    . famotidine (PEPCID) 20 MG tablet  Take 1 tablet (20 mg total) by mouth 2 (two) times daily. 60 tablet 11  . fluticasone (FLONASE) 50 MCG/ACT nasal spray PLACE 2 SPRAYS INTO THE NOSE DAILY. 16 g 11  . HYDROcodone-acetaminophen (NORCO/VICODIN) 5-325 MG tablet Take 1 tablet by mouth every 6 (six) hours as needed for moderate pain. 30 tablet 0  . levothyroxine (SYNTHROID) 50 MCG tablet TAKE 1 TABLET (50 MCG TOTAL) BY MOUTH DAILY. 30 tablet 11  . LORazepam (ATIVAN) 1 MG tablet Take 1 mg by mouth 2 (two) times daily as needed for anxiety.    . mirabegron ER (MYRBETRIQ) 25 MG TB24 tablet Take 1 tablet (25 mg total) by mouth daily. 30 tablet 11  . mirtazapine (REMERON) 30 MG tablet Take 30 mg by mouth at bedtime.     Marland Kitchen PREMARIN 0.625 MG tablet TAKE 1 TABLET BY MOUTH DAILY 30 tablet 10  . sertraline (ZOLOFT) 100 MG tablet Take 100 mg by mouth daily.     . simvastatin (ZOCOR) 20 MG tablet Take 1 tablet (20 mg total) by mouth at bedtime. 30 tablet 11   No current facility-administered medications on file prior to visit.     Review of Systems  Constitutional: Positive for chills, fever and malaise/fatigue. Negative for diaphoresis.  HENT: Negative for congestion, ear pain, nosebleeds, sinus pain and sore throat.   Eyes: Negative for discharge and redness.  Respiratory: Negative for cough, sputum production, shortness of breath, wheezing and stridor.   Cardiovascular: Negative for chest pain, palpitations and leg swelling.  Gastrointestinal: Positive for nausea and vomiting. Negative for abdominal pain, blood in stool, diarrhea and heartburn.  Genitourinary: Negative for dysuria.  Skin: Negative for itching and rash.  Neurological: Negative for dizziness and headaches.  Endo/Heme/Allergies: Negative for polydipsia.       Observations/Objective: Patient appears well, in no distress Weight  is baseline (overwt)  Sitting in recliner No facial swelling or asymmetry Normal voice-not hoarse and no slurred speech No obvious tremor or  mobility impairment Moving neck and UEs normally Able to hear the call well  No cough or shortness of breath during interview  Talkative and mentally sharp with no cognitive changes No skin changes on face or neck , no rash or pallor Affect is normal    Assessment and Plan: Problem List Items Addressed This Visit      Digestive   Nausea & vomiting    With low grade fever No diarrhea or abd pain  Gradually getting better (now just mild nausea) Disc strategy to re hydrate  Bland diet- adv gradually Declined nausea medication  covid test ordered-she will go get today Update if not starting to improve in a week or if worsening        Relevant Orders   Novel Coronavirus, NAA ([facility])     Other   Fever - Primary    Now low grade  Since Thursday with n/v covid test ordered-she will go today  Declined nausea med inst to change to tylenol for fever (less GI irritation) Disc s/s to watch for - cough/sob/loss of taste or smell  She will isolate herself  Pend covid test Update if not starting to improve in a week or if worsening        Relevant Orders   Novel Coronavirus, NAA ([facility])       Follow Up Instructions: Switch to tylenol for fever   Watch for abdominal pain, diarrhea, higher fever or cough/ shortness of breath  Go ahead to [place] in [place]   Update if not starting to improve in a week or if worsening     I discussed the assessment and treatment plan with the patient. The patient was provided an opportunity to ask questions and all were answered. The patient agreed with the plan and demonstrated an understanding of the instructions.   The patient was advised to call back or seek an in-person evaluation if the symptoms worsen or if the condition fails to improve as anticipated.     Loura Pardon, MD

## 2018-09-30 NOTE — Assessment & Plan Note (Signed)
Now low grade  Since Thursday with n/v covid test ordered-she will go today  Declined nausea med inst to change to tylenol for fever (less GI irritation) Disc s/s to watch for - cough/sob/loss of taste or smell  She will isolate herself  Pend covid test Update if not starting to improve in a week or if worsening

## 2018-10-01 LAB — NOVEL CORONAVIRUS, NAA: SARS-CoV-2, NAA: NOT DETECTED

## 2018-10-02 ENCOUNTER — Telehealth: Payer: Self-pay | Admitting: *Deleted

## 2018-10-02 NOTE — Telephone Encounter (Signed)
-----   Message from Abner Greenspan, MD sent at 10/01/2018  8:59 PM EDT ----- covid test is negative  Please ask how her symptoms are

## 2018-10-02 NOTE — Telephone Encounter (Signed)
Patient returned your call  C/B # [phone]

## 2018-10-02 NOTE — Telephone Encounter (Signed)
Left VM requesting pt to call the office back 

## 2018-10-02 NOTE — Telephone Encounter (Signed)
Addressed through result notes  

## 2018-10-03 ENCOUNTER — Telehealth: Payer: Self-pay | Admitting: *Deleted

## 2018-10-03 MED ORDER — TIZANIDINE HCL 2 MG PO TABS: 2.0000 mg | ORAL_TABLET | Freq: Three times a day (TID) | ORAL | 0 refills | Status: DC | PRN

## 2018-10-03 NOTE — Telephone Encounter (Signed)
Called pt and advised.  

## 2018-10-03 NOTE — Telephone Encounter (Signed)
Aware, thanks!

## 2018-10-03 NOTE — Telephone Encounter (Signed)
Patient called back to give update. Patient stated that her fever is pretty much gone.  She stated that her back is still hurting but she believes that this is what is causing her nausea.  Patient has a follow up visit with Dr Ernestina Patches to check on her back and she what could be prescribed to help with pain. She will call us back if anything changes

## 2018-10-03 NOTE — Telephone Encounter (Signed)
Done see note

## 2018-10-03 NOTE — Telephone Encounter (Signed)
Trial of tizanidine instead of baclofen

## 2018-10-03 NOTE — Telephone Encounter (Signed)
Left VM requesting pt to call the office back 

## 2018-11-27 ENCOUNTER — Ambulatory Visit (INDEPENDENT_AMBULATORY_CARE_PROVIDER_SITE_OTHER): Payer: Medicare Other | Admitting: Orthopaedic Surgery

## 2018-11-27 ENCOUNTER — Encounter: Payer: Self-pay | Admitting: Orthopaedic Surgery

## 2018-11-27 ENCOUNTER — Other Ambulatory Visit: Payer: Self-pay

## 2018-11-27 DIAGNOSIS — M25512 Pain in left shoulder: Secondary | ICD-10-CM

## 2018-11-27 DIAGNOSIS — Z9889 Other specified postprocedural states: Secondary | ICD-10-CM

## 2018-11-27 DIAGNOSIS — Z20822 Contact with and (suspected) exposure to covid-19: Secondary | ICD-10-CM

## 2018-11-27 DIAGNOSIS — G8929 Other chronic pain: Secondary | ICD-10-CM | POA: Diagnosis not present

## 2018-11-27 MED ORDER — LIDOCAINE HCL 1 % IJ SOLN
3.0000 mL | INTRAMUSCULAR | Status: AC | PRN
  Administered 2018-11-27: 13:00:00 3 mL

## 2018-11-27 MED ORDER — METHYLPREDNISOLONE ACETATE 40 MG/ML IJ SUSP
40.0000 mg | INTRAMUSCULAR | Status: AC | PRN
  Administered 2018-11-27: 40 mg via INTRA_ARTICULAR

## 2018-11-27 NOTE — Progress Notes (Signed)
Office Visit Note   Patient: Lori Orozco           Date of Birth: Jul 31, 1950           MRN: [id number] Visit Date: 11/27/2018              Requested by: Tower, Wynelle Fanny, MD [place],  [place] [address] PCP: Abner Greenspan, MD   Assessment & Plan: Visit Diagnoses:  1. Chronic left shoulder pain   2. S/P arthroscopy of left shoulder     Plan: Hopefully as time goes by she will continue to have improved shoulder function and decrease pain.  I am pleased with the shoulder function itself.  Hopefully from a pain standpoint trying some Voltaren gel of her shoulder several times a day and 1 more subacromial injection will be helpful.  She agreed with trying this treatment regimen and I placed a steroid injection without difficulty.  All question concerns were answered addressed.  We can see her back in 6 months to see how she is doing overall or sooner if there is any issues.  Follow-Up Instructions: Return in about 6 months (around 05/28/2019).   Orders:  Orders Placed This Encounter  Procedures  . Large Joint Inj   No orders of the defined types were placed in this encounter.     Procedures: Large Joint Inj: L subacromial bursa on 11/27/2018 1:06 PM Indications: pain and diagnostic evaluation Details: 22 G 1.5 in needle  Arthrogram: No  Medications: 3 mL lidocaine 1 %; 40 mg methylPREDNISolone acetate 40 MG/ML Outcome: tolerated well, no immediate complications Procedure, treatment alternatives, risks and benefits explained, specific risks discussed. Consent was given by the patient. Immediately prior to procedure a time out was called to verify the correct patient, procedure, equipment, support staff and site/side marked as required. Patient was prepped and draped in the usual sterile fashion.       Clinical Data: No additional findings.   Subjective: Chief Complaint  Patient presents with  . Left Elbow - Follow-up  . Left Shoulder - Pain   The patient is following up with chronic left shoulder and left elbow pain.  The left elbow is been epicondylitis of the medial aspect.  The left shoulder has had chronic pain and she has had 2 surgeries on that left shoulder with the last being released repair of the infraspinatus aspect of the rotator cuff.  She still has chronic pain in that shoulder.  She is very active 68 year old.  HPI  Review of Systems She currently denies any headache, chest pain, shortness of breath, fever, chills, nausea, vomiting  Objective: Vital Signs: There were no vitals taken for this visit.  Physical Exam She is alert and orient x3 and in no acute Ortho Exam Stress examination of the left shoulder shows she actually has excellent range of motion of that shoulder and good strength.  She still hurts over the repair site over the lateral aspect of the shoulder at the subacromial space just off the edge of the acromion. Specialty Comments:  No specialty comments available.  Imaging: No results found.   PMFS History: Patient Active Problem List   Diagnosis Date Noted  . Nausea & vomiting 09/30/2018  . Fever 09/30/2018  . Status post arthroscopy of left shoulder 01/16/2018  . Estrogen deficiency 06/28/2017  . Medial epicondylitis, left elbow 04/29/2017  . S/P arthroscopy of left shoulder 02/25/2017  . Impingement syndrome of left shoulder 01/30/2017  .  Chronic pain of right knee 11/12/2016  . Pedal edema 10/26/2016  . Right thigh pain 09/12/2016  . Trochanteric bursitis, right hip 04/10/2016  . Elevated BP without diagnosis of hypertension 09/02/2015  . Urge incontinence 07/27/2015  . Obesity 04/28/2015  . Need for hepatitis C screening test 04/26/2015  . Screening for HIV (human immunodeficiency virus) 04/26/2015  . Osteoarthritis of left knee 01/28/2015  . Status post total left knee replacement 01/28/2015  . Vitamin D deficiency 04/27/2014  . Seasonal and perennial allergic rhinitis  06/17/2013  . S/P total hip arthroplasty 03/20/2013  . 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 Encounter for Medicare annual wellness exam 11/18/2012  . History of falling 11/18/2012  . Routine general medical examination at a health care facility 11/10/2012  . 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 Osteopenia 10/19/2011  . Other screening mammogram 10/19/2011  . Hypothyroid 10/19/2011  . ANEMIA 10/11/2009  . PERIPHERAL NEUROPATHY, FEET 08/05/2009  . BACK PAIN 08/05/2009  . HAND PAIN, BILATERAL 08/05/2009  . TREMOR 03/09/2008  . MENOPAUSAL SYNDROME 10/09/2007  . DEPRESSION, MAJOR 05/20/2007  . BIPOLAR AFFECTIVE DISORDER 05/20/2007  . ANXIETY 05/20/2007  . 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 ESOPHAGEAL SPASM 05/20/2007  . HIATAL HERNIA 05/20/2007  . AMAUROSIS FUGAX 05/07/2007  . COPD mixed type (HCC) 03/27/2007  . HYPERCHOLESTEROLEMIA, PURE 12/10/2006  . SYMPTOM, SYNDROME, CHRONIC FATIGUE 12/10/2006   Past Medical History:  Diagnosis Date  . Amaurosis fugax   . Anemia    hx  . Anxiety   . Arthritis   . Asthma   . Cataract   . Chronic fatigue syndrome   . Chronic kidney disease    frequency, nephritis when 68 yrs old  . COPD (chronic obstructive pulmonary disease) (HCC)   . Depression   . Diverticulitis   . Emphysema of lung (HCC)   . Fibromyalgia   . GERD (gastroesophageal reflux disease)    occ  . H/O hiatal hernia   . History of bronchitis   . Hyperlipidemia   . Hypothyroidism   . Interstitial cystitis   . Irritable bowel syndrome   . Lichen sclerosus   . Lumbar herniated disc   . Migraines   . Pneumonia   . PONV (postoperative nausea and vomiting)   . Shortness of breath    exertion   . Thyroid disease    Graves  . Urinary frequency   . Urinary tract infection   . Vertigo     Family History  Problem Relation Age of Onset  . Thyroid disease Mother   . Hypertension Mother   . Kidney disease Mother   . Cancer  Father   . Colon cancer Other     Past Surgical History:  Procedure Laterality Date  . ABDOMINAL HYSTERECTOMY    . bladder sugery     . CAROTID DOPPLAR    . COLONOSCOPY  05/26/2010   avms- otherwise nl , re check 10y  . DEXA-OSTEOPENIA    . DOPPLER ECHOCARDIOGRAPHY    . EPICONDYLITIS    . EYE SURGERY Bilateral    cataracts  . FOOT SURGERY Bilateral   . KNEE SURGERY     Left cartilage  . lipoma in second finger right hand    . PLANTAR FASCIA SURGERY Left   . ROTATOR CUFF REPAIR Left 12/2017  . TEMPOROMANDIBULAR JOINT SURGERY    . TONSILLECTOMY    . TOTAL HIP ARTHROPLASTY Right 03/20/2013   Procedure: Right TOTAL  HIP ARTHROPLASTY;  Surgeon: [name], MD;  Location: [facility] OR;  Service: Orthopedics;  Laterality: Right;  Right Total Hip Arthroplasty  . TOTAL KNEE ARTHROPLASTY Left 01/28/2015   Procedure: LEFT TOTAL KNEE ARTHROPLASTY;  Surgeon: [name], MD;  Location: [facility];  Service: Orthopedics;  Laterality: Left;  . TRIGGER FINGER RELEASE Right   . TROCHANTERIC BURSA EXCISION Right 05/03/2016   Dr. [name], [name]  . WRIST ARTHROSCOPY Right    ligament tear   Social History   Occupational History  . Not on file  Tobacco Use  . Smoking status: Former Smoker    Packs/day: 2.00    Years: 43.00    Pack years: 86.00    Types: Cigarettes    Quit date: 02/26/2013    Years since quitting: 5.7  . Smokeless tobacco: Never Used  . Tobacco comment: "vaping now"  Substance and Sexual Activity  . Alcohol use: No    Alcohol/week: 0.0 standard drinks  . Drug use: No  . Sexual activity: Not Currently

## 2018-11-28 LAB — NOVEL CORONAVIRUS, NAA: SARS-CoV-2, NAA: NOT DETECTED

## 2018-12-15 ENCOUNTER — Ambulatory Visit (INDEPENDENT_AMBULATORY_CARE_PROVIDER_SITE_OTHER): Payer: Medicare Other | Admitting: Internal Medicine

## 2018-12-15 ENCOUNTER — Encounter: Payer: Self-pay | Admitting: Internal Medicine

## 2018-12-15 ENCOUNTER — Other Ambulatory Visit: Payer: Self-pay

## 2018-12-15 ENCOUNTER — Ambulatory Visit (INDEPENDENT_AMBULATORY_CARE_PROVIDER_SITE_OTHER): Payer: Medicare Other

## 2018-12-15 VITALS — BP 120/70 | HR 92 | Temp 98.7°F | Ht 67.0 in | Wt 204.8 lb

## 2018-12-15 DIAGNOSIS — J449 Chronic obstructive pulmonary disease, unspecified: Secondary | ICD-10-CM

## 2018-12-15 NOTE — Patient Instructions (Signed)
Ok to use your albuterol rescue inhaler if needed, as you have been  Order CXR   Dx COPD mixed type  Please call if we can help

## 2018-12-15 NOTE — Progress Notes (Signed)
Subjective:    Patient ID: Lori Orozco, female    DOB: September 13, 1950, 68 y.o.   MRN: [id number]  HPI  female former cigarette/ VAPE smoker followed for COPD, complicated by bipolar, GERD,  PFT 10/09/10-  ----------------------------------------------------------------------------- 07/01/2015-68 year old female VAPE smoker followed for COPD, nicotine use, complicated by bipolar FOLLOW FOR: breathing doing well.  no concerns.  CAT Score: 14  She says she is doing fine since quitting smoking 3 years ago. Still enjoys a nicotine- free VAPE. Only occasionally needs ProAir rescue inhaler Asks Z-Pak to hold CXR 07/01/2014-NAD -----COPD mixed type: Pt states she is doing well overall  Pro-air HFA, She still vapes occasionally but avoids flavored products other than tobacco and is very much aware of the recent lung injury cases.  I advised her to stop completely.  She feels her breathing is good with little cough or wheeze and only using rescue inhaler every few months.  Up-to-date flu and pneumonia vaccines.  She denies acute concerns. CXR 11/29/2016- No active cardiopulmonary disease.  12/15/2018- 68 year old female VAPE smoker followed for COPD, nicotine use, complicated by bipolar disorder ------pt states breathing is at baseline, reports usual DOE Uses rescue inhaler occ, prn. No significant exacerbation. Still vaping some. Overall she seems quite comfortable with her breathing. Discussed Covid precautions and potential vaccine. She is being very careful.  Felt anxious among others in waiting room, despite precautions.  ROS-see HPI   + = positive Constitutional:   No-   weight loss, night sweats, fevers, chills, fatigue, lassitude. HEENT:   No-  headaches, difficulty swallowing, tooth/dental problems, sore throat,       No-  sneezing, itching, ear ache, nasal congestion, post nasal drip,  CV:  No-   chest pain, orthopnea, PND, swelling in lower extremities, anasarca, dizziness,  palpitations Resp: +shortness of breath with exertion or at rest.              No- productive cough,   non-productive cough,  No- coughing up of blood.               No-   change in color of mucus.  No- wheezing.   Skin: No-   rash or lesions. GI:  No-   heartburn, indigestion, abdominal pain, nausea, vomiting,  GU: MS:  + joint pain or swelling.  . Neuro-     nothing unusual Psych:  No- change in mood or affect. depression or +anxiety.  No memory loss.  Objective:   Physical Exam  General- Alert, Oriented, Affect-appropriate, Distress- none acute.   Skin- rash-none, lesions- none, excoriation- none Lymphadenopathy- none Head- atraumatic            Eyes- Gross vision intact, PERRLA, conjunctivae clear secretions            Ears- Hearing, canals-normal            Nose- Clear, no-Septal dev, mucus, polyps, erosion, perforation             Throat- Mallampati III-IV , mucosa clear , drainage- none, tonsils- atrophic Neck- flexible , trachea midline, no stridor , thyroid nl, carotid no bruit Chest - symmetrical excursion , unlabored           Heart/CV- RRR , no murmur , no gallop  , no rub, nl s1 s2                           - JVD- none , edema- none, stasis changes-  none, varices- none           Lung- +clear/ diminished , unlabored, cough-none , dullness-none, rub- none           Chest wall-   Abd-  Br/ Gen/ Rectal- Not done, not indicated Extrem- cyanosis- none, clubbing, none, atrophy- none, strength- nl Neuro- + Jiggling right leg

## 2018-12-16 ENCOUNTER — Other Ambulatory Visit (INDEPENDENT_AMBULATORY_CARE_PROVIDER_SITE_OTHER): Payer: Medicare Other

## 2018-12-16 ENCOUNTER — Telehealth: Payer: Self-pay | Admitting: Internal Medicine

## 2018-12-16 DIAGNOSIS — Z5181 Encounter for therapeutic drug level monitoring: Secondary | ICD-10-CM

## 2018-12-16 DIAGNOSIS — R9389 Abnormal findings on diagnostic imaging of other specified body structures: Secondary | ICD-10-CM

## 2018-12-16 LAB — BASIC METABOLIC PANEL
BUN: 21 mg/dL (ref 6–23)
CO2: 32 mEq/L (ref 19–32)
Calcium: 9.3 mg/dL (ref 8.4–10.5)
Chloride: 100 mEq/L (ref 96–112)
Creatinine, Ser: 0.92 mg/dL (ref 0.40–1.20)
GFR: 60.62 mL/min (ref >60.00)
Glucose, Bld: 90 mg/dL (ref 70–99)
Potassium: 4.4 mEq/L (ref 3.5–5.1)
Sodium: 138 mEq/L (ref 135–145)

## 2018-12-16 NOTE — Assessment & Plan Note (Signed)
Doing much better since she stopped cigarettes. Plan- ok to just use rescue inhaler for now.           Update CXR

## 2018-12-16 NOTE — Telephone Encounter (Signed)
Called and spoke with Patient.  CXR results and recommendations given.  Understanding stated.  BMP and CT chest with contrast ordered.  Nothing further at this time.   CXR results from 12/15/18- Notes recorded by Baird Lyons D, MD on 12/15/2018 at 4:31 PM EDT  CXR- shows a shadow which is new and needs to be looked at more closely.   Order- BMET for renal function  Order- schedule CT chest with contrast  Dx abnormal CXR

## 2018-12-17 ENCOUNTER — Ambulatory Visit (INDEPENDENT_AMBULATORY_CARE_PROVIDER_SITE_OTHER): Payer: Medicare Other | Admitting: Family Medicine

## 2018-12-17 ENCOUNTER — Encounter: Payer: Self-pay | Admitting: Family Medicine

## 2018-12-17 DIAGNOSIS — R21 Rash and other nonspecific skin eruption: Secondary | ICD-10-CM | POA: Insufficient documentation

## 2018-12-17 MED ORDER — NYSTATIN 100000 UNIT/GM EX CREA: 1.0000 "application " | TOPICAL_CREAM | Freq: Two times a day (BID) | CUTANEOUS | 0 refills | Status: DC

## 2018-12-17 NOTE — Progress Notes (Signed)
Virtual Visit via Video Note  I connected with Lori Orozco on 12/17/18 at  4:30 PM EDT by a video enabled telemedicine application and verified that I am speaking with the correct person using two identifiers.  Location: Patient: home Provider: office   Parties taking part in encounter Patient: Lori Orozco Treating physician: Roxy Manns MD    I discussed the limitations of evaluation and management by telemedicine and the availability of in person appointments. The patient expressed understanding and agreed to proceed.  History of Present Illness: Pt presents for rash  on hip and waist for 3 d  (around underwear line)  ? If reaction to material/fabric  Itchy and it burns  Both sides - going around her back   No vesicles  No hives  Is splotchy   Has used cortisone cream otc  Taking antihistamine   Tried clotrimazole - tried her room mate's cream- ? If helped   Body soap is - caress  Laundry detergent - purcell  Downy fabric softener  Patient Active Problem List   Diagnosis Date Noted  . Nausea & vomiting 09/30/2018  . Fever 09/30/2018  . Status post arthroscopy of left shoulder 01/16/2018  . Estrogen deficiency 06/28/2017  . Medial epicondylitis, left elbow 04/29/2017  . S/P arthroscopy of left shoulder 02/25/2017  . Impingement syndrome of left shoulder 01/30/2017  . Chronic pain of right knee 11/12/2016  . Pedal edema 10/26/2016  . Right thigh pain 09/12/2016  . Trochanteric bursitis, right hip 04/10/2016  . Elevated BP without diagnosis of hypertension 09/02/2015  . Urge incontinence 07/27/2015  . Obesity 04/28/2015  . Need for hepatitis C screening test 04/26/2015  . Screening for HIV (human immunodeficiency virus) 04/26/2015  . Osteoarthritis of left knee 01/28/2015  . Status post total left knee replacement 01/28/2015  . Vitamin D deficiency 04/27/2014  . Seasonal and perennial allergic rhinitis 06/17/2013  . S/P total hip arthroplasty  03/20/2013  . 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 Encounter for Medicare annual wellness exam 11/18/2012  . History of falling 11/18/2012  . Routine general medical examination at a health care facility 11/10/2012  . 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 Osteopenia 10/19/2011  . Other screening mammogram 10/19/2011  . Hypothyroid 10/19/2011  . ANEMIA 10/11/2009  . PERIPHERAL NEUROPATHY, FEET 08/05/2009  . BACK PAIN 08/05/2009  . HAND PAIN, BILATERAL 08/05/2009  . TREMOR 03/09/2008  . MENOPAUSAL SYNDROME 10/09/2007  . DEPRESSION, MAJOR 05/20/2007  . BIPOLAR AFFECTIVE DISORDER 05/20/2007  . ANXIETY 05/20/2007  . 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 ESOPHAGEAL SPASM 05/20/2007  . HIATAL HERNIA 05/20/2007  . AMAUROSIS FUGAX 05/07/2007  . COPD mixed type (HCC) 03/27/2007  . HYPERCHOLESTEROLEMIA, PURE 12/10/2006  . SYMPTOM, SYNDROME, CHRONIC FATIGUE 12/10/2006   Past Medical History:  Diagnosis Date  . Amaurosis fugax   . Anemia    hx  . Anxiety   . Arthritis   . Asthma   . Cataract   . Chronic fatigue syndrome   . Chronic kidney disease    frequency, nephritis when 68 yrs old  . COPD (chronic obstructive pulmonary disease) (HCC)   . Depression   . Diverticulitis   . Emphysema of lung (HCC)   . Fibromyalgia   . GERD (gastroesophageal reflux disease)    occ  . H/O hiatal hernia   . History of bronchitis   . Hyperlipidemia   . Hypothyroidism   . Interstitial cystitis   . Irritable  bowel syndrome   . Lichen sclerosus   . Lumbar herniated disc   . Migraines   . Pneumonia   . PONV (postoperative nausea and vomiting)   . Shortness of breath    exertion   . Thyroid disease    [place]  . Urinary frequency   . Urinary tract infection   . Vertigo    Past Surgical History:  Procedure Laterality Date  . ABDOMINAL HYSTERECTOMY    . bladder sugery     . CAROTID DOPPLAR    . COLONOSCOPY  05/26/2010   avms- otherwise nl , re check  10y  . DEXA-OSTEOPENIA    . DOPPLER ECHOCARDIOGRAPHY    . EPICONDYLITIS    . EYE SURGERY Bilateral    cataracts  . FOOT SURGERY Bilateral   . KNEE SURGERY     Left cartilage  . lipoma in second finger right hand    . PLANTAR FASCIA SURGERY Left   . ROTATOR CUFF REPAIR Left 12/2017  . TEMPOROMANDIBULAR JOINT SURGERY    . TONSILLECTOMY    . TOTAL HIP ARTHROPLASTY Right 03/20/2013   Procedure: Right TOTAL HIP ARTHROPLASTY;  Surgeon: Nadara MustardMarcus V Duda, MD;  Location: [facility] OR;  Service: Orthopedics;  Laterality: Right;  Right Total Hip Arthroplasty  . TOTAL KNEE ARTHROPLASTY Left 01/28/2015   Procedure: LEFT TOTAL KNEE ARTHROPLASTY;  Surgeon: Kathryne Hitchhristopher Y Blackman, MD;  Location: [facility];  Service: Orthopedics;  Laterality: Left;  . TRIGGER FINGER RELEASE Right   . TROCHANTERIC BURSA EXCISION Right 05/03/2016   Dr. Magnus IvanBlackman, Cristal Deerhristopher  . WRIST ARTHROSCOPY Right    ligament tear   Social History   Tobacco Use  . Smoking status: Former Smoker    Packs/day: 2.00    Years: 43.00    Pack years: 86.00    Types: Cigarettes    Quit date: 02/26/2013    Years since quitting: 5.8  . Smokeless tobacco: Never Used  . Tobacco comment: "vaping now"  Substance Use Topics  . Alcohol use: No    Alcohol/week: 0.0 standard drinks  . Drug use: No   Family History  Problem Relation Age of Onset  . Thyroid disease Mother   . Hypertension Mother   . Kidney disease Mother   . Cancer Father   . Colon cancer Other    Allergies  Allergen Reactions  . Lithium Anaphylaxis  . Tegretol [Carbamazepine] Other (See Comments)    Fever and body aches (fever over 103)  . Tricyclic Antidepressants Anaphylaxis  . Strawberry Extract Hives  . Codeine Nausea Only    Makes pt stay awake  . Cymbalta [Duloxetine Hcl] Other (See Comments)    Makes pt pass out   . Erythromycin     REACTION: abdominal pain  . Lyrica [Pregabalin]     Felt faint  . Neurontin [Gabapentin]     Passes  out  . Rabeprazole Sodium      REACTION: insomnia  . Duraprep [employer][Antiseptic Products, Misc.] Itching and Rash    Tolerates Betadine   . Penicillins Rash    Has patient had a PCN reaction causing immediate rash, facial/tongue/throat swelling, SOB or lightheadedness with hypotension: Yes Has patient had a PCN reaction causing severe rash involving mucus membranes or skin necrosis: No Has patient had a PCN reaction that required hospitalization No Has patient had a PCN reaction occurring within the last 10 years: No If all of the above answers are "NO", then may proceed with Cephalosporin use.   . Tape Rash  PT ALLERGIC NYLON TAPE    Current Outpatient Medications on File Prior to Visit  Medication Sig Dispense Refill  . albuterol (PROAIR HFA) 108 (90 Base) MCG/ACT inhaler INHALE 2 PUFFS INTO THE LUNGS EVERY 4 HOURS AS NEEDED FOR WHEEZING 8.5 each 4  . buPROPion (WELLBUTRIN XL) 150 MG 24 hr tablet Take 150 mg by mouth daily.      . busPIRone (BUSPAR) 15 MG tablet Take 15 mg by mouth 2 (two) times daily before a meal.     . Cholecalciferol (VITAMIN D3) 2000 UNITS TABS Take 2,000 Units by mouth daily.    . clobetasol cream (TEMOVATE) 0.05 % APPLY SMALL AMOUNT TO AFFECTED AREAS 2 TO 3 TIMES PER WEEK 15 g 6  . cyanocobalamin 2000 MCG tablet Take 2,000 mcg by mouth daily.    . divalproex (DEPAKOTE ER) 250 MG 24 hr tablet Take 500 mg by mouth daily.    Marland Kitchen EPINEPHrine 0.3 mg/0.3 mL IJ SOAJ injection Inject into the skin.    . famotidine (PEPCID) 20 MG tablet Take 1 tablet (20 mg total) by mouth 2 (two) times daily. 60 tablet 11  . fluticasone (FLONASE) 50 MCG/ACT nasal spray PLACE 2 SPRAYS INTO THE NOSE DAILY. 16 g 11  . HYDROcodone-acetaminophen (NORCO/VICODIN) 5-325 MG tablet Take 1 tablet by mouth every 6 (six) hours as needed for moderate pain. 30 tablet 0  . levothyroxine (SYNTHROID) 50 MCG tablet TAKE 1 TABLET (50 MCG TOTAL) BY MOUTH DAILY. 30 tablet 11  . LORazepam (ATIVAN) 1 MG tablet Take 1 mg by mouth 2 (two) times  daily as needed for anxiety.    . mirabegron ER (MYRBETRIQ) 25 MG TB24 tablet Take 1 tablet (25 mg total) by mouth daily. 30 tablet 11  . mirtazapine (REMERON) 30 MG tablet Take 30 mg by mouth at bedtime.     Marland Kitchen PREMARIN 0.625 MG tablet TAKE 1 TABLET BY MOUTH DAILY 30 tablet 10  . sertraline (ZOLOFT) 100 MG tablet Take 100 mg by mouth daily.     . simvastatin (ZOCOR) 20 MG tablet Take 1 tablet (20 mg total) by mouth at bedtime. 30 tablet 11  . tiZANidine (ZANAFLEX) 2 MG tablet Take 1 tablet (2 mg total) by mouth every 8 (eight) hours as needed for muscle spasms. 30 tablet 0   No current facility-administered medications on file prior to visit.    Review of Systems  Constitutional: Negative for chills, fever and malaise/fatigue.  HENT: Negative for congestion, ear pain, sinus pain and sore throat.   Eyes: Negative for blurred vision, discharge and redness.  Respiratory: Negative for cough, shortness of breath and stridor.   Cardiovascular: Negative for chest pain, palpitations and leg swelling.  Gastrointestinal: Negative for abdominal pain, diarrhea, nausea and vomiting.  Musculoskeletal: Negative for myalgias.  Skin: Positive for itching and rash.  Neurological: Negative for dizziness and headaches.      Observations/Objective: Patient appears well, in no distress Weight is baseline  No facial swelling or asymmetry Normal voice-not hoarse and no slurred speech No obvious tremor or mobility impairment Moving neck and UEs normally Able to hear the call well  No cough or shortness of breath during interview  Talkative and mentally sharp with no cognitive changes Pink to red rash noted on low back/waist (does cross the midline) and over gluteal cleft , appears patchy and no papules or vesicles seen- per pt slightly raised if any and smooth in texture  Affect is normal    Assessment and Plan:  Problem List Items Addressed This Visit      Musculoskeletal and Integument   Rash and  nonspecific skin eruption    3 days/ around waist and buttocks and itchy  Unfortunately -not well visualized with camera (if no improvement will need to come in) Suspect candidal rash/intertrigo  Adv to keep clean and dry  Nystatin cream send to pharmacy to use bid Avoid allergens/chemicals/fragrances and hot conditions  Antihistamine prn and cold compress F/u if no improvement or if worse          Follow Up Instructions: Start using nystatin cream twice daily to itchy /rash areas Watch for any blistering or other changes Let me know if no improvement in 2-3 days   Keep cool/ cold packs may help  Keep area dry  Use unscented products if you can Oral antihistamine is ok   Update if worse or no improvement (we would need to see you in the office)    I discussed the assessment and treatment plan with the patient. The patient was provided an opportunity to ask questions and all were answered. The patient agreed with the plan and demonstrated an understanding of the instructions.   The patient was advised to call back or seek an in-person evaluation if the symptoms worsen or if the condition fails to improve as anticipated.     Roxy Manns, MD

## 2018-12-17 NOTE — Assessment & Plan Note (Signed)
3 days/ around waist and buttocks and itchy  Unfortunately -not well visualized with camera (if no improvement will need to come in) Suspect candidal rash/intertrigo  Adv to keep clean and dry  Nystatin cream send to pharmacy to use bid Avoid allergens/chemicals/fragrances and hot conditions  Antihistamine prn and cold compress F/u if no improvement or if worse

## 2018-12-17 NOTE — Patient Instructions (Signed)
Start using nystatin cream twice daily to itchy /rash areas Watch for any blistering or other changes Let me know if no improvement in 2-3 days   Keep cool/ cold packs may help  Keep area dry  Use unscented products if you can Oral antihistamine is ok   Update if worse or no improvement (we would need to see you in the office)

## 2018-12-29 ENCOUNTER — Ambulatory Visit (INDEPENDENT_AMBULATORY_CARE_PROVIDER_SITE_OTHER): Payer: Medicare Other

## 2018-12-29 ENCOUNTER — Other Ambulatory Visit: Payer: Self-pay

## 2018-12-29 DIAGNOSIS — R911 Solitary pulmonary nodule: Secondary | ICD-10-CM

## 2018-12-29 DIAGNOSIS — R9389 Abnormal findings on diagnostic imaging of other specified body structures: Secondary | ICD-10-CM | POA: Diagnosis not present

## 2018-12-29 MED ORDER — IOHEXOL 300 MG/ML  SOLN
100.0000 mL | Freq: Once | INTRAMUSCULAR | Status: AC | PRN
  Administered 2018-12-29: 75 mL via INTRAVENOUS

## 2019-01-06 ENCOUNTER — Ambulatory Visit (INDEPENDENT_AMBULATORY_CARE_PROVIDER_SITE_OTHER): Payer: Medicare Other | Admitting: Orthopaedic Surgery

## 2019-01-06 ENCOUNTER — Encounter: Payer: Self-pay | Admitting: Orthopaedic Surgery

## 2019-01-06 DIAGNOSIS — M7702 Medial epicondylitis, left elbow: Secondary | ICD-10-CM

## 2019-01-06 MED ORDER — LIDOCAINE HCL 1 % IJ SOLN
1.0000 mL | INTRAMUSCULAR | Status: AC | PRN
  Administered 2019-01-06: 1 mL

## 2019-01-06 MED ORDER — METHYLPREDNISOLONE ACETATE 40 MG/ML IJ SUSP
40.0000 mg | INTRAMUSCULAR | Status: AC | PRN
  Administered 2019-01-06: 11:00:00 40 mg

## 2019-01-06 NOTE — Progress Notes (Signed)
   Procedure Note  Patient: Lori Orozco             Date of Birth: Jun 01, 1950           MRN: [id number]             Visit Date: 01/06/2019 HPI: Ms. Lori Orozco comes in today with left elbow pain.  She has  chronic left elbow pain and has undergone multiple injections.  She also underwent an MRI 08/23/2018 of the left elbow which was normal.  She comes in today due to increased pain the last 3 months.  She has never had therapy to this area.  She does take ibuprofen.  She is requesting repeat injection  Physical exam: Left elbow good range of motion.  Flexion of the wrist against resistance causes pain medial aspect the elbow.  She has tenderness over the medial epicondyle of the left wrist.  No rashes skin lesions ulcerations or edema about the left elbow.  Procedures: Visit Diagnoses:  1. Medial epicondylitis, left elbow     Hand/UE Inj for medial epicondylitis on 01/06/2019 10:30 AM Medications: 1 mL lidocaine 1 %; 40 mg methylPREDNISolone acetate 40 MG/ML    Plan: We will send her to formal physical therapy for range of motion modalities and home exercise program.  She will follow-up with [place] as needed.  Questions encouraged and answered at length

## 2019-01-29 ENCOUNTER — Other Ambulatory Visit: Payer: Self-pay | Admitting: Obstetrics & Gynecology

## 2019-02-02 ENCOUNTER — Other Ambulatory Visit: Payer: Self-pay

## 2019-02-02 ENCOUNTER — Telehealth: Payer: Self-pay | Admitting: Orthopaedic Surgery

## 2019-02-02 DIAGNOSIS — M7702 Medial epicondylitis, left elbow: Secondary | ICD-10-CM

## 2019-02-02 DIAGNOSIS — M25522 Pain in left elbow: Secondary | ICD-10-CM

## 2019-02-02 NOTE — Telephone Encounter (Signed)
It is okay to get a MRI of her left elbow to assess for tendon tears or other pathology that is causing her to have pain.  She does have a history of epicondylitis.

## 2019-02-02 NOTE — Telephone Encounter (Signed)
Order placed

## 2019-02-02 NOTE — Telephone Encounter (Signed)
Patient called advised her last day of (PT) is tomorrow and nothing is working. Patient said the injections are not working either. Patient asked if she can get an MRI or CT with Contrast for her elbow. The number to contact patient is [phone]

## 2019-02-02 NOTE — Telephone Encounter (Signed)
Please advise 

## 2019-02-06 ENCOUNTER — Telehealth: Payer: Self-pay | Admitting: Orthopaedic Surgery

## 2019-02-06 NOTE — Telephone Encounter (Signed)
I called patient and advised. 

## 2019-02-06 NOTE — Telephone Encounter (Signed)
Patient called stating that she was under the impression that the MRI would be with contrast not without.  CB#[phone].  Thank you.

## 2019-02-06 NOTE — Telephone Encounter (Signed)
Please advise. Did you want MRI Arthrogram of Elbow?  MRI Elbow without contrast has been entered.

## 2019-02-06 NOTE — Telephone Encounter (Signed)
Just a regular MRI.  Not an arthrogram.

## 2019-02-07 ENCOUNTER — Encounter: Payer: Self-pay | Admitting: Family Medicine

## 2019-02-18 ENCOUNTER — Other Ambulatory Visit: Payer: Self-pay

## 2019-02-18 ENCOUNTER — Ambulatory Visit
Admission: RE | Admit: 2019-02-18 | Discharge: 2019-02-18 | Disposition: A | Discharge status: Home / Self Care | Payer: Medicare Other | Source: Ambulatory Visit | Attending: Orthopaedic Surgery | Admitting: Orthopaedic Surgery

## 2019-02-18 DIAGNOSIS — M25522 Pain in left elbow: Secondary | ICD-10-CM

## 2019-02-18 DIAGNOSIS — M7702 Medial epicondylitis, left elbow: Secondary | ICD-10-CM

## 2019-02-23 ENCOUNTER — Ambulatory Visit (INDEPENDENT_AMBULATORY_CARE_PROVIDER_SITE_OTHER): Payer: Medicare Other | Admitting: Orthopaedic Surgery

## 2019-02-23 ENCOUNTER — Other Ambulatory Visit: Payer: Self-pay

## 2019-02-23 ENCOUNTER — Encounter: Payer: Self-pay | Admitting: Orthopaedic Surgery

## 2019-02-23 DIAGNOSIS — M25522 Pain in left elbow: Secondary | ICD-10-CM | POA: Diagnosis not present

## 2019-02-23 DIAGNOSIS — M7702 Medial epicondylitis, left elbow: Secondary | ICD-10-CM

## 2019-02-23 NOTE — Progress Notes (Signed)
The patient is here today to go over a MRI of her left elbow.  She has been dealing with chronic left elbow pain for over 3 years now.  We have diagnosed her with medial epicondylitis.  She has had no evidence of cubital tunnel syndrome.  We have injected the medial epicondyle area of her elbow multiple times over 3 years.  She has tried topical anti-inflammatories as well as activity modification.  She has tried oral anti-inflammatories as well as physical therapy.  Due to continued severe pain we decided to obtain an MRI to look for any pathology around the elbow.  On exam she has full elbow motion on the left elbow and it is ligamentously stable but is severely tender to palpation along the common flexor tendon origin at the medial epicondyle of the left elbow.  The MRI does show moderate tendinopathy of the flexor tendon origin at the medial epicondyle and significant medial epicondylitis.  There is no gross tears that can be seen.  This point given the failed conservative treatment over 3 years I have recommended a debridement of the medial epicondyle area of the left elbow.  I explained in detail what the surgery involves.  We talked about her interoperative and postoperative course and having her void heavy lifting and contact sports for at least 3 months after surgery.  All question concerns were answered addressed.  At this point I feel that is necessary as the she given the worsening of her symptoms combined with failure of conservative treatment for over 3 years.  We would then see her back at 2 weeks postoperative for wound assessment and removal of sutures as needed.

## 2019-03-05 ENCOUNTER — Encounter: Payer: Self-pay | Admitting: Orthopaedic Surgery

## 2019-03-05 ENCOUNTER — Other Ambulatory Visit: Payer: Self-pay | Admitting: Orthopaedic Surgery

## 2019-03-05 DIAGNOSIS — M7702 Medial epicondylitis, left elbow: Secondary | ICD-10-CM | POA: Diagnosis not present

## 2019-03-05 MED ORDER — ONDANSETRON 4 MG PO TBDP: 4.0000 mg | ORAL_TABLET | Freq: Three times a day (TID) | ORAL | 0 refills | Status: DC | PRN

## 2019-03-05 MED ORDER — HYDROCODONE-ACETAMINOPHEN 7.5-325 MG PO TABS: 1.0000 | ORAL_TABLET | Freq: Four times a day (QID) | ORAL | 0 refills | Status: DC | PRN

## 2019-03-11 ENCOUNTER — Inpatient Hospital Stay: Payer: Medicare Other | Admitting: Orthopaedic Surgery

## 2019-03-13 ENCOUNTER — Encounter: Payer: Self-pay | Admitting: Family Medicine

## 2019-03-13 ENCOUNTER — Other Ambulatory Visit: Payer: Self-pay

## 2019-03-13 ENCOUNTER — Ambulatory Visit (INDEPENDENT_AMBULATORY_CARE_PROVIDER_SITE_OTHER): Payer: Medicare Other | Admitting: Family Medicine

## 2019-03-13 VITALS — Temp 99.7°F | Wt 205.0 lb

## 2019-03-13 DIAGNOSIS — J449 Chronic obstructive pulmonary disease, unspecified: Secondary | ICD-10-CM | POA: Diagnosis not present

## 2019-03-13 DIAGNOSIS — J069 Acute upper respiratory infection, unspecified: Secondary | ICD-10-CM | POA: Insufficient documentation

## 2019-03-13 MED ORDER — PREDNISONE 10 MG PO TABS: ORAL_TABLET | ORAL | 0 refills | Status: DC

## 2019-03-13 MED ORDER — ALBUTEROL SULFATE HFA 108 (90 BASE) MCG/ACT IN AERS: INHALATION_SPRAY | RESPIRATORY_TRACT | 5 refills | Status: DC

## 2019-03-13 NOTE — Assessment & Plan Note (Signed)
In fair control during this uri Prednisone 30 mg taper px  Update if not starting to improve in a week or if worsening  -esp if wheezing Pt will also get tested for covid

## 2019-03-13 NOTE — Patient Instructions (Addendum)
Please get a covid test scheduled with [facility] or any other outside source and let me know when it comes back   Stay isolated (as you have been) Drink fluids and rest  Take the prednisone as directed  Tylenol will help fever and chills mucinex for congestion   Call and seek care if symptoms suddenly worsen  Also if symptoms change- let us know

## 2019-03-13 NOTE — Assessment & Plan Note (Signed)
In pt with copd  Enc to get tested for covid/info given to schedule test   (her partner also needs testing) Fluids/rest/iaolatio  Prednisone for copd /sob  Symptomatic care  Alert if symptoms worsen or change  inst to call when she gets a result of covid test
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
## 2019-03-16 ENCOUNTER — Ambulatory Visit: Payer: Medicare Other | Attending: Internal Medicine

## 2019-03-16 ENCOUNTER — Telehealth: Payer: Self-pay | Admitting: Orthopaedic Surgery

## 2019-03-16 DIAGNOSIS — Z20822 Contact with and (suspected) exposure to covid-19: Secondary | ICD-10-CM

## 2019-03-16 NOTE — Telephone Encounter (Signed)
Called patient

## 2019-03-16 NOTE — Telephone Encounter (Signed)
Patient called and stated she was to get stitches out on Thurs, 03/19/19 but has tested positive for Covid 19 on 03/11/19.  Wants someone to call her to advise what she should do. [phone]

## 2019-03-16 NOTE — Telephone Encounter (Signed)
Please advise 

## 2019-03-16 NOTE — Telephone Encounter (Signed)
Have her come to parking lot and call someone will come out to her car.

## 2019-03-17 ENCOUNTER — Telehealth: Payer: Self-pay

## 2019-03-17 LAB — NOVEL CORONAVIRUS, NAA: SARS-CoV-2, NAA: DETECTED — AB

## 2019-03-17 NOTE — Telephone Encounter (Signed)
Left VM letting pt know Dr. Tower's comments  

## 2019-03-17 NOTE — Telephone Encounter (Addendum)
Pt left v/m that she just found out she is positive for covid. Pt wanted to know if Dr Milinda Antis had any special instructions.  Pt had virtual appt with on 03/13/19. Unable to reach pt or any DPR contact on pts chart. Left v/m for pt to call [facility] for further instructions and present symptoms. Pt called back; pt temp earlier today was 101.8 fever broke this afternoon.chilling really badly at times; pt has had lower back pain on 03/11/19 but after taking steroids back pain is better. No UTI symptoms. No severe h/a. Pt has generalized weakness especially in legs. Prod cough with milky white phlegm. No rash. SOB upon exertion. Pt had diarrhea 2 days ago but no abd pain. Pt cannot taste. Pt has head & chest  Congestion and inhaler helps congestion. No wheezing and No CP. No N& V. Pt does not feel in any distress at this time. Suggest for pt to increase fluid intake, rest, and take Tylenol fever and body aches. Pt is self quarantining. UC & ED precautions given an pt voiced understanding. Pt request any further recommendations from Dr Milinda Antis. Pt request cb.

## 2019-03-17 NOTE — Telephone Encounter (Signed)
Thanks for letting me know.  Pretty much the same instructions- acetaminophen for fever and pain/ mucinex for congestion and lots of rest/fluids and isolation  If sob please alert [place] and get to ER   Please let me know how symptoms are later in the week

## 2019-03-17 NOTE — Telephone Encounter (Signed)
Patient returned call  Phone- [phone]

## 2019-03-18 ENCOUNTER — Other Ambulatory Visit: Payer: Self-pay | Admitting: Adult Health

## 2019-03-18 DIAGNOSIS — U071 COVID-19: Secondary | ICD-10-CM

## 2019-03-18 NOTE — Progress Notes (Signed)
  I connected by phone with Lori Orozco on 03/18/2019 at 6:27 PM to discuss the potential use of an new treatment for mild to moderate COVID-19 viral infection in non-hospitalized patients.  This patient is a 69 y.o. female that meets the FDA criteria for Emergency Use Authorization of bamlanivimab or casirivimab\imdevimab.  Has a (+) direct SARS-CoV-2 viral test result  Has mild or moderate COVID-19   Is ? 69 years of age and weighs ? 40 kg  Is NOT hospitalized due to COVID-19  Is NOT requiring oxygen therapy or requiring an increase in baseline oxygen flow rate due to COVID-19  Is within 10 days of symptom onset  Has at least one of the high risk factor(s) for progression to severe COVID-19 and/or hospitalization as defined in EUA.  Specific high risk criteria : >/= 69 yo   I have spoken and communicated the following to the patient or parent/caregiver:  1. FDA has authorized the emergency use of bamlanivimab and casirivimab\imdevimab for the treatment of mild to moderate COVID-19 in adults and pediatric patients with positive results of direct SARS-CoV-2 viral testing who are 24 years of age and older weighing at least 40 kg, and who are at high risk for progressing to severe COVID-19 and/or hospitalization.  2. The significant known and potential risks and benefits of bamlanivimab and casirivimab\imdevimab, and the extent to which such potential risks and benefits are unknown.  3. Information on available alternative treatments and the risks and benefits of those alternatives, including clinical trials.  4. Patients treated with bamlanivimab and casirivimab\imdevimab should continue to self-isolate and use infection control measures (e.g., wear mask, isolate, social distance, avoid sharing personal items, clean and disinfect "high touch" surfaces, and frequent handwashing) according to CDC guidelines.   5. The patient or parent/caregiver has the option to accept or refuse  bamlanivimab or casirivimab\imdevimab .  After reviewing this information with the patient, The patient agreed to proceed with receiving the bamlanimivab infusion and will be provided a copy of the Fact sheet prior to receiving the infusion.Lori Orozco [name] 03/18/2019 6:27 PM

## 2019-03-19 ENCOUNTER — Ambulatory Visit (INDEPENDENT_AMBULATORY_CARE_PROVIDER_SITE_OTHER): Payer: Medicare Other | Admitting: Physician Assistant

## 2019-03-19 ENCOUNTER — Other Ambulatory Visit: Payer: Self-pay

## 2019-03-19 ENCOUNTER — Inpatient Hospital Stay: Payer: Medicare Other | Admitting: Orthopaedic Surgery

## 2019-03-19 ENCOUNTER — Encounter: Payer: Self-pay | Admitting: Physician Assistant

## 2019-03-19 DIAGNOSIS — M7702 Medial epicondylitis, left elbow: Secondary | ICD-10-CM

## 2019-03-19 NOTE — Progress Notes (Signed)
HPI: Ms. Orlene Och returns today 2 weeks status post left elbow debridement epicondyle.  Patient seen in the parking lot for suture removal due to the fact that she has COVID.  She is due to have antibody infusion this Saturday.  States overall the elbow is doing well.  Physical exam: Left elbow full extension full flexion.  Surgical incision site is healing well and well approximated with interrupted nylon sutures.  There is no signs of infection.  She has full supination pronation of the left forearm.  Impression: 2 weeks status post debridement medial epicondyle  Plan: Sutures removed Steri-Strips applied.  She is able to get the incision wet but not submerge the incision.  She will follow-up with [place] in 1 month sooner if there is any questions concerns.

## 2019-03-20 NOTE — Telephone Encounter (Signed)
That sounds ok.  Keep doing what she is doing and go forward with the infusion if her pulmonologist wants her to do it. Rest/fluids/symptomatic care    If SOB that does not improve with rest- then go to ER Update [place] on monday

## 2019-03-20 NOTE — Telephone Encounter (Signed)
Pt notified of Dr. Royden Purl comments and recommendations and verbalized understanding. She will update [place] on Monday

## 2019-03-20 NOTE — Telephone Encounter (Signed)
Called to check on pt and she said she isn't feeling good but she's stable. Pt said she is still dealing with fevers and severe chills, she is having to take ibuprofen and tylenol often. She is very weak with no energy but her pulmonologist did scheduled her to get an IV infusion tomorrow so she is hoping that will help. Pt said that she isn't having any trouble breathing but if she "moves around to much" she becomes SOB but once she sits down she is able to recover quickly.

## 2019-03-21 ENCOUNTER — Ambulatory Visit (HOSPITAL_COMMUNITY)
Admission: RE | Admit: 2019-03-21 | Discharge: 2019-03-21 | Disposition: A | Discharge status: Home / Self Care | Payer: Medicare Other | Source: Ambulatory Visit | Attending: Pulmonary Disease | Admitting: Pulmonary Disease

## 2019-03-21 DIAGNOSIS — U071 COVID-19: Secondary | ICD-10-CM | POA: Diagnosis present

## 2019-03-21 DIAGNOSIS — Z23 Encounter for immunization: Secondary | ICD-10-CM | POA: Diagnosis not present

## 2019-03-21 MED ORDER — METHYLPREDNISOLONE SODIUM SUCC 125 MG IJ SOLR: 125.0000 mg | Freq: Once | INTRAMUSCULAR | Status: DC | PRN

## 2019-03-21 MED ORDER — FAMOTIDINE IN NACL 20-0.9 MG/50ML-% IV SOLN: 20.0000 mg | Freq: Once | INTRAVENOUS | Status: DC | PRN

## 2019-03-21 MED ORDER — SODIUM CHLORIDE 0.9 % IV SOLN
700.0000 mg | Freq: Once | INTRAVENOUS | Status: AC
  Administered 2019-03-21: 09:00:00 700 mg via INTRAVENOUS
  Filled 2019-03-21: qty 20

## 2019-03-21 MED ORDER — DIPHENHYDRAMINE HCL 50 MG/ML IJ SOLN: 50.0000 mg | Freq: Once | INTRAMUSCULAR | Status: DC | PRN

## 2019-03-21 MED ORDER — EPINEPHRINE 0.3 MG/0.3ML IJ SOAJ: 0.3000 mg | Freq: Once | INTRAMUSCULAR | Status: DC | PRN

## 2019-03-21 MED ORDER — ALBUTEROL SULFATE HFA 108 (90 BASE) MCG/ACT IN AERS: 2.0000 | INHALATION_SPRAY | Freq: Once | RESPIRATORY_TRACT | Status: DC | PRN

## 2019-03-21 MED ORDER — SODIUM CHLORIDE 0.9 % IV SOLN
INTRAVENOUS | Status: DC | PRN
  Administered 2019-03-21: 250 mL via INTRAVENOUS

## 2019-03-21 NOTE — Discharge Instructions (Signed)

## 2019-03-21 NOTE — Progress Notes (Signed)
  Diagnosis: COVID-19  Physician: Dr Wright  Procedure: [facility] Med: bamlanivimab infusion - Provided patient with bamlanimivab fact sheet for patients, parents and caregivers prior to infusion.  Complications: No immediate complications noted.  Discharge: Discharged home   Lori Orozco 03/21/2019  

## 2019-03-23 NOTE — Telephone Encounter (Signed)
I'm glad she is feeling better!   I do recommend the covid vaccine in 90 days if she is a candidate (cannot get it if past h/o anaphylaxis with a vaccine)     we will see what the guidelines look like in 3 months as well

## 2019-03-23 NOTE — Telephone Encounter (Signed)
Pt notified of Dr. Tower's comments and verbalized understanding  

## 2019-03-23 NOTE — Telephone Encounter (Signed)
Patient called and states she is doing much better. Not eating well still-food tastes horrible, feels weak and SOB because she is not eating like she should. No fever today so far. She feels like things are looking up.   Patient wanted to know if she should get the COVID vaccine after 90 days since she had the COVID virus?

## 2019-03-26 ENCOUNTER — Encounter: Payer: Self-pay | Admitting: Family Medicine

## 2019-03-26 NOTE — Telephone Encounter (Signed)
It is exactly the same  The albuterol on your list right now actually says proair already Please tell the pharmacy the next time you get it filled and they can call me if they need more clarification  Glad you are starting to feel better!

## 2019-03-31 ENCOUNTER — Telehealth: Payer: Self-pay | Admitting: *Deleted

## 2019-03-31 ENCOUNTER — Encounter (INDEPENDENT_AMBULATORY_CARE_PROVIDER_SITE_OTHER): Payer: Self-pay

## 2019-03-31 ENCOUNTER — Ambulatory Visit (INDEPENDENT_AMBULATORY_CARE_PROVIDER_SITE_OTHER): Payer: Medicare Other | Admitting: Family Medicine

## 2019-03-31 ENCOUNTER — Encounter: Payer: Self-pay | Admitting: Family Medicine

## 2019-03-31 DIAGNOSIS — U071 COVID-19: Secondary | ICD-10-CM | POA: Insufficient documentation

## 2019-03-31 DIAGNOSIS — F319 Bipolar disorder, unspecified: Secondary | ICD-10-CM | POA: Diagnosis not present

## 2019-03-31 NOTE — Patient Instructions (Signed)
Take your acetaminophen on a schedule (instead of as needed)  650 mg every 4-6 hours  This should help chills/aches and fever Keep up a good fluid intake  Do watch for symptoms of pneumonia/sinus infection/dehydration or anything new Let us know if not improving over the next week

## 2019-03-31 NOTE — Assessment & Plan Note (Signed)
Pos test 1/18 symptomatic  myoclonal antibody infusion 1/23 (no immediate reaction) Respiratory symptoms are improved Fever/chills/aches persist (worse at night)-but temp is lower  Disc course of covid - not uncommon  Disc sympt care at home  Will plan on acetaminophen -on a schedule of 650 mg every 4-6 hours for the next several days - then taper off  Continue to watch temp  inst to alert [place] if any new symptoms or signs of pneumonia or bacterial sinus infection (reviewed)  Will monitor closely

## 2019-03-31 NOTE — Progress Notes (Signed)
Virtual Visit via Video Note  I connected with Lori Orozco on 03/31/19 at  2:00 PM EST by a video enabled telemedicine application and verified that I am speaking with the correct person using two identifiers.  Location: Patient: home Provider: office   I discussed the limitations of evaluation and management by telemedicine and the availability of in person appointments. The patient expressed understanding and agreed to proceed.  Parties involved in encounter  Patient: Lori Orozco  Provider:  Roxy Manns MD   History of Present Illness: Pt presents with covid 19 and ? Infusion reaction from monoclonal antibody  She had pos test on 1/18 for covid   Since Thursday her legs age badly including knees  Chills and aches  Temp: 99.7 F (37.6 C)   Comes and goes -fever  The highest temp 101 - early on    Cough- not much/ raspy voice  pnd - clears throat  Head feels full  A bit sob with exertion - but better than it was   Cannot get nasal mucous out very well   Taking nasonex Tylenol -prn  Ibuprofen   Has not use nasal saline in a while Mucous is thick and white     Infusion was done on 1/23 The following medications were ordered: methylPREDNISolone sodium succinate (SOLU-MEDROL) 125 mg/2 mL injection 125 mg - Discontinued Once PRN 03/21/19 03/22/19  0.9 % sodium chloride infusion Order Report Discontinued As needed 03/21/19 03/22/19  albuterol (VENTOLIN HFA) 108 (90 Base) MCG/ACT inhaler 2 puff - Discontinued Once PRN 03/21/19 03/22/19  bamlanivimab 700 mg in sodium chloride 0.9 % 270 mL IVPB Order Report Completed Once 03/21/19 03/21/19  diphenhydrAMINE (BENADRYL) injection 50 mg - Discontinued Once PRN 03/21/19 03/22/19  EPINEPHrine (EPI-PEN) injection 0.3 mg - Discontinued Once PRN 03/21/19 03/22/19  famotidine (PEPCID) IVPB 20 mg premix        During the actual infusion - no problems  Only ended up getting the antibody and the saline  Started  having more symptoms the following Thursday   Patient Active Problem List   Diagnosis Date Noted  . COVID-19 03/31/2019  . Viral URI with cough 03/13/2019  . Rash and nonspecific skin eruption 12/17/2018  . Fever 09/30/2018  . Status post arthroscopy of left shoulder 01/16/2018  . Estrogen deficiency 06/28/2017  . Medial epicondylitis, left elbow 04/29/2017  . S/P arthroscopy of left shoulder 02/25/2017  . Impingement syndrome of left shoulder 01/30/2017  . Chronic pain of right knee 11/12/2016  . Pedal edema 10/26/2016  . Right thigh pain 09/12/2016  . Trochanteric bursitis, right hip 04/10/2016  . Elevated BP without diagnosis of hypertension 09/02/2015  . Urge incontinence 07/27/2015  . Obesity 04/28/2015  . Need for hepatitis C screening test 04/26/2015  . Screening for HIV (human immunodeficiency virus) 04/26/2015  . Osteoarthritis of left knee 01/28/2015  . Status post total left knee replacement 01/28/2015  . Vitamin D deficiency 04/27/2014  . Seasonal and perennial allergic rhinitis 06/17/2013  . S/P total hip arthroplasty 03/20/2013  . 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 Encounter for Medicare annual wellness exam 11/18/2012  . History of falling 11/18/2012  . Routine general medical examination at a health care facility 11/10/2012  . 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 Osteopenia 10/19/2011  . Other screening mammogram 10/19/2011  . Hypothyroid 10/19/2011  . ANEMIA 10/11/2009  . PERIPHERAL NEUROPATHY, FEET 08/05/2009  . BACK PAIN 08/05/2009  . HAND PAIN,  BILATERAL 08/05/2009  . TREMOR 03/09/2008  . MENOPAUSAL SYNDROME 10/09/2007  . DEPRESSION, MAJOR 05/20/2007  . BIPOLAR AFFECTIVE DISORDER 05/20/2007  . ANXIETY 05/20/2007  . 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 ESOPHAGEAL SPASM 05/20/2007  . HIATAL HERNIA 05/20/2007  . AMAUROSIS FUGAX 05/07/2007  . COPD mixed type (HCC) 03/27/2007  . HYPERCHOLESTEROLEMIA, PURE  12/10/2006  . SYMPTOM, SYNDROME, CHRONIC FATIGUE 12/10/2006   Past Medical History:  Diagnosis Date  . Amaurosis fugax   . Anemia    hx  . Anxiety   . Arthritis   . Asthma   . Cataract   . Chronic fatigue syndrome   . Chronic kidney disease    frequency, nephritis when 69 yrs old  . COPD (chronic obstructive pulmonary disease) (HCC)   . Depression   . Diverticulitis   . Emphysema of lung (HCC)   . Fibromyalgia   . GERD (gastroesophageal reflux disease)    occ  . H/O hiatal hernia   . History of bronchitis   . Hyperlipidemia   . Hypothyroidism   . Interstitial cystitis   . Irritable bowel syndrome   . Lichen sclerosus   . Lumbar herniated disc   . Migraines   . Pneumonia   . PONV (postoperative nausea and vomiting)   . Shortness of breath    exertion   . Thyroid disease    [place]  . Urinary frequency   . Urinary tract infection   . Vertigo    Past Surgical History:  Procedure Laterality Date  . ABDOMINAL HYSTERECTOMY    . bladder sugery     . CAROTID DOPPLAR    . COLONOSCOPY  05/26/2010   avms- otherwise nl , re check 10y  . DEXA-OSTEOPENIA    . DOPPLER ECHOCARDIOGRAPHY    . EPICONDYLITIS    . EYE SURGERY Bilateral    cataracts  . FOOT SURGERY Bilateral   . KNEE SURGERY     Left cartilage  . lipoma in second finger right hand    . PLANTAR FASCIA SURGERY Left   . ROTATOR CUFF REPAIR Left 12/2017  . TEMPOROMANDIBULAR JOINT SURGERY    . TONSILLECTOMY    . TOTAL HIP ARTHROPLASTY Right 03/20/2013   Procedure: Right TOTAL HIP ARTHROPLASTY;  Surgeon: [name], MD;  Location: [facility] OR;  Service: Orthopedics;  Laterality: Right;  Right Total Hip Arthroplasty  . TOTAL KNEE ARTHROPLASTY Left 01/28/2015   Procedure: LEFT TOTAL KNEE ARTHROPLASTY;  Surgeon: [name], MD;  Location: [facility];  Service: Orthopedics;  Laterality: Left;  . TRIGGER FINGER RELEASE Right   . TROCHANTERIC BURSA EXCISION Right 05/03/2016   Dr. [name], [name]  . WRIST  ARTHROSCOPY Right    ligament tear   Social History   Tobacco Use  . Smoking status: Former Smoker    Packs/day: 2.00    Years: 43.00    Pack years: 86.00    Types: Cigarettes    Quit date: 02/26/2013    Years since quitting: 6.0  . Smokeless tobacco: Never Used  . Tobacco comment: "vaping now"  Substance Use Topics  . Alcohol use: No    Alcohol/week: 0.0 standard drinks  . Drug use: No   Family History  Problem Relation Age of Onset  . Thyroid disease Mother   . Hypertension Mother   . Kidney disease Mother   . Cancer Father   . Colon cancer Other    Allergies  Allergen Reactions  . Lithium Anaphylaxis  . Tegretol [Carbamazepine] Other (See  Comments)    Fever and body aches (fever over 103)  . Tricyclic Antidepressants Anaphylaxis  . Strawberry Extract Hives  . Codeine Nausea Only    Makes pt stay awake  . Cymbalta [Duloxetine Hcl] Other (See Comments)    Makes pt pass out   . Erythromycin     REACTION: abdominal pain  . Lyrica [Pregabalin]     Felt faint  . Neurontin [Gabapentin]     Passes  out  . Rabeprazole Sodium     REACTION: insomnia  . Duraprep [employer], Misc.] Itching and Rash    Tolerates Betadine   . Penicillins Rash    Has patient had a PCN reaction causing immediate rash, facial/tongue/throat swelling, SOB or lightheadedness with hypotension: Yes Has patient had a PCN reaction causing severe rash involving mucus membranes or skin necrosis: No Has patient had a PCN reaction that required hospitalization No Has patient had a PCN reaction occurring within the last 10 years: No If all of the above answers are "NO", then may proceed with Cephalosporin use.   . Tape Rash    PT ALLERGIC NYLON TAPE    Current Outpatient Medications on File Prior to Visit  Medication Sig Dispense Refill  . albuterol (PROAIR HFA) 108 (90 Base) MCG/ACT inhaler INHALE 2 PUFFS INTO THE LUNGS EVERY 4 HOURS AS NEEDED FOR WHEEZING 18 g 5  . buPROPion (WELLBUTRIN  XL) 150 MG 24 hr tablet Take 150 mg by mouth daily.      . busPIRone (BUSPAR) 15 MG tablet Take 15 mg by mouth 2 (two) times daily before a meal.     . Cholecalciferol (VITAMIN D3) 2000 UNITS TABS Take 2,000 Units by mouth daily.    . clobetasol cream (TEMOVATE) 0.05 % APPLY SMALL AMOUNT TO AFFECTED AREAS TWO TO THREE TIMES PER WEEK 30 g 4  . cyanocobalamin 2000 MCG tablet Take 2,000 mcg by mouth daily.    . divalproex (DEPAKOTE ER) 250 MG 24 hr tablet Take 500 mg by mouth daily.    . famotidine (PEPCID) 20 MG tablet Take 1 tablet (20 mg total) by mouth 2 (two) times daily. 60 tablet 11  . fluticasone (FLONASE) 50 MCG/ACT nasal spray PLACE 2 SPRAYS INTO THE NOSE DAILY. 16 g 11  . HYDROcodone-acetaminophen (NORCO) 7.5-325 MG tablet Take 1-2 tablets by mouth every 6 (six) hours as needed for moderate pain. 30 tablet 0  . levothyroxine (SYNTHROID) 50 MCG tablet TAKE 1 TABLET (50 MCG TOTAL) BY MOUTH DAILY. 30 tablet 11  . LORazepam (ATIVAN) 1 MG tablet Take 1 mg by mouth 2 (two) times daily as needed for anxiety.    . mirabegron ER (MYRBETRIQ) 25 MG TB24 tablet Take 1 tablet (25 mg total) by mouth daily. 30 tablet 11  . mirtazapine (REMERON) 30 MG tablet Take 30 mg by mouth at bedtime.     . ondansetron (ZOFRAN ODT) 4 MG disintegrating tablet Take 1 tablet (4 mg total) by mouth every 8 (eight) hours as needed for nausea or vomiting. 20 tablet 0  . PREMARIN 0.625 MG tablet TAKE 1 TABLET BY MOUTH DAILY 30 tablet 10  . sertraline (ZOLOFT) 100 MG tablet Take 100 mg by mouth daily.     . simvastatin (ZOCOR) 20 MG tablet Take 1 tablet (20 mg total) by mouth at bedtime. 30 tablet 11  . tiZANidine (ZANAFLEX) 2 MG tablet Take 1 tablet (2 mg total) by mouth every 8 (eight) hours as needed for muscle spasms. 30 tablet 0  No current facility-administered medications on file prior to visit.   Review of Systems  Constitutional: Positive for chills, fever and malaise/fatigue. Negative for diaphoresis.  HENT:  Positive for congestion. Negative for ear pain, sinus pain and sore throat.   Eyes: Negative for blurred vision, discharge and redness.  Respiratory: Positive for cough and sputum production. Negative for shortness of breath, wheezing and stridor.   Cardiovascular: Negative for chest pain, palpitations and leg swelling.  Gastrointestinal: Negative for abdominal pain, diarrhea, nausea and vomiting.  Musculoskeletal: Positive for myalgias.  Skin: Negative for rash.  Neurological: Positive for headaches. Negative for dizziness.      Observations/Objective: Patient appears well, in no distress (improved energy) Weight is baseline  No facial swelling or asymmetry Mildly hoarse voice  No obvious tremor or mobility impairment Moving neck and UEs normally Able to hear the call well  No cough or shortness of breath during interview  Talkative and mentally sharp with no cognitive changes No skin changes on face or neck , no rash or pallor Affect is normal    Assessment and Plan: Problem List Items Addressed This Visit      Other   COVID-19    Pos test 1/18 symptomatic  myoclonal antibody infusion 1/23 (no immediate reaction) Respiratory symptoms are improved Fever/chills/aches persist (worse at night)-but temp is lower  Disc course of covid - not uncommon  Disc sympt care at home  Will plan on acetaminophen -on a schedule of 650 mg every 4-6 hours for the next several days - then taper off  Continue to watch temp  inst to alert [place] if any new symptoms or signs of pneumonia or bacterial sinus infection (reviewed)  Will monitor closely           Follow Up Instructions: Take your acetaminophen on a schedule (instead of as needed)  650 mg every 4-6 hours  This should help chills/aches and fever Keep up a good fluid intake  Do watch for symptoms of pneumonia/sinus infection/dehydration or anything new Let us know if not improving over the next week    I discussed the assessment  and treatment plan with the patient. The patient was provided an opportunity to ask questions and all were answered. The patient agreed with the plan and demonstrated an understanding of the instructions.   The patient was advised to call back or seek an in-person evaluation if the symptoms worsen or if the condition fails to improve as anticipated.     Loura Pardon, MD

## 2019-03-31 NOTE — Telephone Encounter (Signed)
Patient stated that she had already spoken with her provider in regards to her elevated temp and was given recommendations. She denies having any other concerns at this time.

## 2019-03-31 NOTE — Assessment & Plan Note (Signed)
Stable Pt continues psychiatric care

## 2019-04-01 ENCOUNTER — Encounter (INDEPENDENT_AMBULATORY_CARE_PROVIDER_SITE_OTHER): Payer: Self-pay

## 2019-04-02 ENCOUNTER — Encounter (INDEPENDENT_AMBULATORY_CARE_PROVIDER_SITE_OTHER): Payer: Self-pay

## 2019-04-03 ENCOUNTER — Encounter (INDEPENDENT_AMBULATORY_CARE_PROVIDER_SITE_OTHER): Payer: Self-pay

## 2019-04-04 ENCOUNTER — Encounter (INDEPENDENT_AMBULATORY_CARE_PROVIDER_SITE_OTHER): Payer: Self-pay

## 2019-04-05 ENCOUNTER — Encounter (INDEPENDENT_AMBULATORY_CARE_PROVIDER_SITE_OTHER): Payer: Self-pay

## 2019-04-06 ENCOUNTER — Encounter (INDEPENDENT_AMBULATORY_CARE_PROVIDER_SITE_OTHER): Payer: Self-pay

## 2019-04-07 ENCOUNTER — Encounter (INDEPENDENT_AMBULATORY_CARE_PROVIDER_SITE_OTHER): Payer: Self-pay

## 2019-04-08 ENCOUNTER — Encounter (INDEPENDENT_AMBULATORY_CARE_PROVIDER_SITE_OTHER): Payer: Self-pay

## 2019-04-09 ENCOUNTER — Ambulatory Visit: Payer: Medicare Other | Admitting: Physician Assistant

## 2019-04-09 ENCOUNTER — Encounter: Payer: Self-pay | Admitting: Orthopaedic Surgery

## 2019-04-09 ENCOUNTER — Encounter (INDEPENDENT_AMBULATORY_CARE_PROVIDER_SITE_OTHER): Payer: Self-pay

## 2019-04-09 ENCOUNTER — Other Ambulatory Visit: Payer: Self-pay

## 2019-04-09 ENCOUNTER — Ambulatory Visit (INDEPENDENT_AMBULATORY_CARE_PROVIDER_SITE_OTHER): Payer: Medicare Other | Admitting: Orthopaedic Surgery

## 2019-04-09 DIAGNOSIS — M7702 Medial epicondylitis, left elbow: Secondary | ICD-10-CM

## 2019-04-09 MED ORDER — HYDROCODONE-ACETAMINOPHEN 7.5-325 MG PO TABS: 1.0000 | ORAL_TABLET | Freq: Four times a day (QID) | ORAL | 0 refills | Status: DC | PRN

## 2019-04-09 NOTE — Progress Notes (Signed)
The patient comes in today about 6 weeks out from a left elbow lateral epicondylar release.  She said there is little bit of soreness but she is doing well from the elbow standpoint.  She unfortunately did contract COVID-19.  She had to have a monoclonal antibody infusion.  She is still feeling short of breath but overall feels like she is making some progress.  The elbow is where it should be at 6 weeks postoperative.  On examination of her left elbow the incisions healed nicely.  There is still some pain over the medial epicondyle but not severe.  She has good mobility of the elbow and it is stable ligamentously.  I will send in some more hydrocodone for her.  I would like to see her back in 4 weeks to see how she is doing overall.  All question concerns were answered and addressed.

## 2019-04-10 ENCOUNTER — Encounter (INDEPENDENT_AMBULATORY_CARE_PROVIDER_SITE_OTHER): Payer: Self-pay

## 2019-04-11 ENCOUNTER — Encounter (INDEPENDENT_AMBULATORY_CARE_PROVIDER_SITE_OTHER): Payer: Self-pay

## 2019-04-12 ENCOUNTER — Encounter (INDEPENDENT_AMBULATORY_CARE_PROVIDER_SITE_OTHER): Payer: Self-pay

## 2019-04-13 ENCOUNTER — Encounter (INDEPENDENT_AMBULATORY_CARE_PROVIDER_SITE_OTHER): Payer: Self-pay

## 2019-05-07 ENCOUNTER — Other Ambulatory Visit: Payer: Self-pay

## 2019-05-07 ENCOUNTER — Ambulatory Visit (INDEPENDENT_AMBULATORY_CARE_PROVIDER_SITE_OTHER): Payer: Medicare Other | Admitting: Orthopaedic Surgery

## 2019-05-07 ENCOUNTER — Encounter: Payer: Self-pay | Admitting: Orthopaedic Surgery

## 2019-05-07 DIAGNOSIS — M7702 Medial epicondylitis, left elbow: Secondary | ICD-10-CM

## 2019-05-07 DIAGNOSIS — Z9889 Other specified postprocedural states: Secondary | ICD-10-CM

## 2019-05-07 NOTE — Progress Notes (Signed)
The patient is 53-month status post a left elbow lateral epicondylar release.  She is well-known to me.  She is also out from the rotator cuff repair of her left shoulder done within the last year or more.  She is doing well.  She does report pain with her shoulder and her elbow both on the left side.  On exam the elbow is ligamentously stable and her incisions healed nicely.  There is still some pain over the medial structures of the elbow.  She has negative Tinel's sign at the cubital tunnel.  She is having some shoulder pain but improved function on examination of the left shoulder.  We will see her back in 4 weeks from now to make sure she is doing well.  That is a visit where we have plan to place a steroid injection in the subacromial outlet of her left shoulder we can always inject the left elbow medial epicondyle area with a steroid at that standpoint.  Be comfortable doing so only if she needs it.

## 2019-05-15 ENCOUNTER — Encounter: Payer: Self-pay | Admitting: Family Medicine

## 2019-06-04 ENCOUNTER — Ambulatory Visit (INDEPENDENT_AMBULATORY_CARE_PROVIDER_SITE_OTHER): Payer: Medicare Other | Admitting: Orthopaedic Surgery

## 2019-06-04 ENCOUNTER — Encounter: Payer: Self-pay | Admitting: Orthopaedic Surgery

## 2019-06-04 ENCOUNTER — Other Ambulatory Visit: Payer: Self-pay

## 2019-06-04 DIAGNOSIS — M7702 Medial epicondylitis, left elbow: Secondary | ICD-10-CM | POA: Diagnosis not present

## 2019-06-04 DIAGNOSIS — Z9889 Other specified postprocedural states: Secondary | ICD-10-CM | POA: Diagnosis not present

## 2019-06-04 DIAGNOSIS — M25512 Pain in left shoulder: Secondary | ICD-10-CM

## 2019-06-04 MED ORDER — METHYLPREDNISOLONE ACETATE 40 MG/ML IJ SUSP
40.0000 mg | INTRAMUSCULAR | Status: AC | PRN
  Administered 2019-06-04: 13:00:00 40 mg

## 2019-06-04 MED ORDER — METHYLPREDNISOLONE ACETATE 40 MG/ML IJ SUSP
40.0000 mg | INTRAMUSCULAR | Status: AC | PRN
  Administered 2019-06-04: 40 mg via INTRA_ARTICULAR

## 2019-06-04 MED ORDER — LIDOCAINE HCL 1 % IJ SOLN
3.0000 mL | INTRAMUSCULAR | Status: AC | PRN
  Administered 2019-06-04: 13:00:00 3 mL

## 2019-06-04 MED ORDER — LIDOCAINE HCL 1 % IJ SOLN
1.0000 mL | INTRAMUSCULAR | Status: AC | PRN
  Administered 2019-06-04: 1 mL

## 2019-06-04 NOTE — Progress Notes (Signed)
   Procedure Note  Patient: Lori Orozco             Date of Birth: [birth year]           MRN: [id number]             Visit Date: 06/04/2019  Procedures: Visit Diagnoses:  1. S/P arthroscopy of left shoulder   2. Medial epicondylitis, left elbow     Hand/UE Inj: L elbow for medial epicondylitis on 06/04/2019 1:14 PM Medications: 1 mL lidocaine 1 %; 40 mg methylPREDNISolone acetate 40 MG/ML  Large Joint Inj: L subacromial bursa on 06/04/2019 1:14 PM Indications: pain and diagnostic evaluation Details: 22 G 1.5 in needle  Arthrogram: No  Medications: 3 mL lidocaine 1 %; 40 mg methylPREDNISolone acetate 40 MG/ML Outcome: tolerated well, no immediate complications Procedure, treatment alternatives, risks and benefits explained, specific risks discussed. Consent was given by the patient. Immediately prior to procedure a time out was called to verify the correct patient, procedure, equipment, support staff and site/side marked as required. Patient was prepped and draped in the usual sterile fashion.    I am seeing the patient for 3 different issues today.  1 is for her left shoulder.  She has a history of a left shoulder arthroscopy with subacromial decompression.  Later she ended up having a repeat arthroscopy for rotator cuff tear and we were able to perform a partial repair of the rotator cuff.  In January of this year we performed a medial epicondylar release on the left elbow after over a years worth of treatment they fail conservative treatment.  She has a history of a left total knee arthroplasty done several years ago.  She says the knee is intimately swollen for her.  She is requesting injections in at least her left shoulder and left elbow today.  She is 69 years old.  She is very active and she is an avid Teacher, music as well.  On examination of her left shoulder she has good range of motion of the shoulder but there is certainly some pain and weakness of the shoulder.  Examination of  the left elbow shows her elbow is ligamentously stable full range of motion.  The incision medially is well-healed with no redness but she is tender over the surgical site.  Examination of her left knee shows some slight plate in the ligaments of the knee in terms of may feel like her polyethylene liner needs to be upsize but she denies any symptoms of instability.  There is no effusion today and her range of motion is full.  I did place a steroid injection in her left shoulder and left epicondyle area medially on the elbow.  She tolerated both of these well.  She understands that I would like to give her a break for any type of injections for at least 3 to 6 months.  If the knee becomes unstable to her she will let us know.  We had a long thorough discussion today.  All question concerns were answered addressed.  At this point follow-up can be as needed.  She will try to get back to her regular exercise routine as well.

## 2019-07-01 ENCOUNTER — Other Ambulatory Visit: Payer: Self-pay | Admitting: Family Medicine

## 2019-07-01 NOTE — Telephone Encounter (Signed)
Will hold until after CPE on 07/03/19 to make sure dose doesn't need to be adjusted

## 2019-07-03 ENCOUNTER — Encounter: Payer: Self-pay | Admitting: Family Medicine

## 2019-07-03 ENCOUNTER — Ambulatory Visit: Payer: Medicare Other

## 2019-07-03 ENCOUNTER — Other Ambulatory Visit: Payer: Self-pay

## 2019-07-03 ENCOUNTER — Ambulatory Visit (INDEPENDENT_AMBULATORY_CARE_PROVIDER_SITE_OTHER): Payer: Medicare Other | Admitting: Family Medicine

## 2019-07-03 VITALS — BP 132/81 | HR 82 | Temp 97.9°F | Ht 66.25 in | Wt 209.1 lb

## 2019-07-03 DIAGNOSIS — E559 Vitamin D deficiency, unspecified: Secondary | ICD-10-CM | POA: Diagnosis not present

## 2019-07-03 DIAGNOSIS — Z0000 Encounter for general adult medical examination without abnormal findings: Secondary | ICD-10-CM

## 2019-07-03 DIAGNOSIS — J449 Chronic obstructive pulmonary disease, unspecified: Secondary | ICD-10-CM | POA: Diagnosis not present

## 2019-07-03 DIAGNOSIS — E039 Hypothyroidism, unspecified: Secondary | ICD-10-CM

## 2019-07-03 DIAGNOSIS — N904 Leukoplakia of vulva: Secondary | ICD-10-CM

## 2019-07-03 DIAGNOSIS — M8580 Other specified disorders of bone density and structure, unspecified site: Secondary | ICD-10-CM

## 2019-07-03 DIAGNOSIS — E7800 Pure hypercholesterolemia, unspecified: Secondary | ICD-10-CM | POA: Diagnosis not present

## 2019-07-03 DIAGNOSIS — R600 Localized edema: Secondary | ICD-10-CM | POA: Diagnosis not present

## 2019-07-03 DIAGNOSIS — E6609 Other obesity due to excess calories: Secondary | ICD-10-CM

## 2019-07-03 DIAGNOSIS — Z6833 Body mass index (BMI) 33.0-33.9, adult: Secondary | ICD-10-CM

## 2019-07-03 LAB — COMPREHENSIVE METABOLIC PANEL
ALT: 11 U/L (ref 0–35)
AST: 16 U/L (ref 0–37)
Albumin: 4.2 g/dL (ref 3.5–5.2)
Alkaline Phosphatase: 64 U/L (ref 39–117)
BUN: 26 mg/dL — ABNORMAL HIGH (ref 6–23)
CO2: 32 mEq/L (ref 19–32)
Calcium: 9.2 mg/dL (ref 8.4–10.5)
Chloride: 102 mEq/L (ref 96–112)
Creatinine, Ser: 1.04 mg/dL (ref 0.40–1.20)
GFR: 52.54 mL/min — ABNORMAL LOW (ref >60.00)
Glucose, Bld: 92 mg/dL (ref 70–99)
Potassium: 4.4 mEq/L (ref 3.5–5.1)
Sodium: 136 mEq/L (ref 135–145)
Total Bilirubin: 0.5 mg/dL (ref 0.2–1.2)
Total Protein: 7.5 g/dL (ref 6.0–8.3)

## 2019-07-03 LAB — CBC WITH DIFFERENTIAL/PLATELET
Basophils Absolute: 0 10*3/uL (ref 0.0–0.1)
Basophils Relative: 0.6 % (ref 0.0–3.0)
Eosinophils Absolute: 0.1 10*3/uL (ref 0.0–0.7)
Eosinophils Relative: 0.8 % (ref 0.0–5.0)
HCT: 36.9 % (ref 36.0–46.0)
Hemoglobin: 12.3 g/dL (ref 12.0–15.0)
Lymphocytes Relative: 35.5 % (ref 12.0–46.0)
Lymphs Abs: 2.5 10*3/uL (ref 0.7–4.0)
MCHC: 33.3 g/dL (ref 30.0–36.0)
MCV: 94.7 fl (ref 78.0–100.0)
Monocytes Absolute: 0.5 10*3/uL (ref 0.1–1.0)
Monocytes Relative: 6.5 % (ref 3.0–12.0)
Neutro Abs: 4 10*3/uL (ref 1.4–7.7)
Neutrophils Relative %: 56.6 % (ref 43.0–77.0)
Platelets: 253 10*3/uL (ref 150.0–400.0)
RBC: 3.89 Mil/uL (ref 3.87–5.11)
RDW: 16.3 % — ABNORMAL HIGH (ref 11.5–15.5)
WBC: 7 10*3/uL (ref 4.0–10.5)

## 2019-07-03 LAB — VITAMIN D 25 HYDROXY (VIT D DEFICIENCY, FRACTURES): VITD: 69.35 ng/mL (ref 30.00–100.00)

## 2019-07-03 LAB — LIPID PANEL
Cholesterol: 203 mg/dL — ABNORMAL HIGH (ref 0–200)
HDL: 72.2 mg/dL (ref >39.00)
LDL Cholesterol: 110 mg/dL — ABNORMAL HIGH (ref 0–99)
NonHDL: 130.73
Total CHOL/HDL Ratio: 3
Triglycerides: 104 mg/dL (ref 0.0–149.0)
VLDL: 20.8 mg/dL (ref 0.0–40.0)

## 2019-07-03 LAB — TSH: TSH: 2.03 u[IU]/mL (ref 0.35–4.50)

## 2019-07-03 MED ORDER — FAMOTIDINE 20 MG PO TABS: 20.0000 mg | ORAL_TABLET | Freq: Two times a day (BID) | ORAL | 11 refills | Status: DC

## 2019-07-03 MED ORDER — MIRABEGRON ER 25 MG PO TB24: 25.0000 mg | ORAL_TABLET | Freq: Every day | ORAL | 11 refills | Status: DC

## 2019-07-03 MED ORDER — SIMVASTATIN 20 MG PO TABS: 20.0000 mg | ORAL_TABLET | Freq: Every day | ORAL | 11 refills | Status: DC

## 2019-07-03 NOTE — Progress Notes (Signed)
Subjective:    Patient ID: Lori Orozco, female    DOB: 06-16-1950, 69 y.o.   MRN: [id number]  This visit occurred during the SARS-CoV-2 public health emergency.  Safety protocols were in place, including screening questions prior to the visit, additional usage of staff PPE, and extensive cleaning of exam room while observing appropriate contact time as indicated for disinfecting solutions.    HPI Pt presents for medicare wellness visit with review of chronic health problems   I have personally reviewed the Medicare Annual Wellness questionnaire and have noted 1. The patient's medical and social history 2. Their use of alcohol, tobacco or illicit drugs 3. Their current medications and supplements 4. The patient's functional ability including ADL's, fall risks, home safety risks and hearing or visual             impairment. 5. Diet and physical activities 6. Evidence for depression or mood disorders  The patients weight, height, BMI have been recorded in the chart and visual acuity is per eye clinic.  I have made referrals, counseling and provided education to the patient based review of the above and I have provided the pt with a written personalized care plan for preventive services. Reviewed and updated provider list, see scanned forms.  See scanned forms.  Routine anticipatory guidance given to patient.  See health maintenance. Colon cancer screening colonoscopy 3/12 Breast cancer screening  Mammogram 6/20 Self breast exam-no lumps  Gyn -due in June (Dr [name] retired) , she was set up with someone else - following her lichen sclerosis  Flu vaccine 9/20 Tetanus vaccine 8/13 Td Pneumovax-up to date  covid status -had the illness this winter  First moderna vaccine was 4/25 (gave her chills)  Zoster vaccine-shingrix 7/19 Dexa- 5/19 BMD in the normal range  Falls- none  Fractures-none  Supplements-takes her D /history of low D Exercise -she has baseline sob and a lot of  joint problems/unable to do much   Advance directive-has living will /needs to do her power of attorney (given materials)  Cognitive function addressed- see scanned forms- and if abnormal then additional documentation follows.  Since having covid -did struggle with concentration/memory but this is getting better gradually  (brain fog)  That was frustrating   PMH and SH reviewed No changes in family history   Meds, vitals, and allergies reviewed.   ROS: See HPI.  Otherwise negative.    Weight : Wt Readings from Last 3 Encounters:  07/03/19 209 lb 2 oz (94.9 kg)  03/13/19 205 lb (93 kg)  12/15/18 204 lb 12.8 oz (92.9 kg)  she cannot quit eating as night  Does eat a balanced dinner - then very hungry at 9-10 pm   Does aim for raw veggies  33.50 kg/m   Noticed more of a tremor in both hands when picking things up     Hearing/vision:  Hearing Screening             Right ear:   40 40 40  0    Left ear:   40 40 40  0    Vision Screening Comments: Last vision exam in Jan 2021 with Dr. Dagoberto Ligas day to day does not notice problems    Due for labs today  BP Readings from Last 3 Encounters:  07/03/19 (!) 142/70  03/21/19 140/70  12/15/18 120/70  bp better on 2nd check BP: 132/81   Pulse Readings from Last 3 Encounters:  07/03/19 82  03/21/19 76  12/15/18  92    COPD- fairly stable (is sob with exertion esp since covid)- getting a little better    Hypothyroid Hypothyroidism  Pt has no clinical changes No change in energy level/ hair or skin/ edema and no tremor Lab Results  Component Value Date   TSH 2.82 06/30/2018    Due for labs today  Hyperlipidemia  Due for labs Simvastatin and diet  Last time:  Lab Results  Component Value Date   CHOL 164 06/30/2018   HDL 62.20 06/30/2018   LDLCALC 79 06/30/2018   TRIG 110.0 06/30/2018   CHOLHDL 3 06/30/2018   Patient Active Problem List   Diagnosis Date Noted    . Rash and nonspecific skin eruption 12/17/2018  . Fever 09/30/2018  . Status post arthroscopy of left shoulder 01/16/2018  . Estrogen deficiency 06/28/2017  . Medial epicondylitis, left elbow 04/29/2017  . S/P arthroscopy of left shoulder 02/25/2017  . Impingement syndrome of left shoulder 01/30/2017  . Chronic pain of right knee 11/12/2016  . Pedal edema 10/26/2016  . Trochanteric bursitis, right hip 04/10/2016  . Elevated BP without diagnosis of hypertension 09/02/2015  . Urge incontinence 07/27/2015  . Obesity 04/28/2015  . Need for hepatitis C screening test 04/26/2015  . Screening for HIV (human immunodeficiency virus) 04/26/2015  . Osteoarthritis of left knee 01/28/2015  . Status post total left knee replacement 01/28/2015  . Vitamin D deficiency 04/27/2014  . Seasonal and perennial allergic rhinitis 06/17/2013  . S/P total hip arthroplasty 03/20/2013  . 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 Encounter for Medicare annual wellness exam 11/18/2012  . History of falling 11/18/2012  . Routine general medical examination at a health care facility 11/10/2012  . 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 Osteopenia 10/19/2011  . Other screening mammogram 10/19/2011  . Hypothyroid 10/19/2011  . ANEMIA 10/11/2009  . PERIPHERAL NEUROPATHY, FEET 08/05/2009  . BACK PAIN 08/05/2009  . HAND PAIN, BILATERAL 08/05/2009  . TREMOR 03/09/2008  . MENOPAUSAL SYNDROME 10/09/2007  . DEPRESSION, MAJOR 05/20/2007  . BIPOLAR AFFECTIVE DISORDER 05/20/2007  . ANXIETY 05/20/2007  . 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 ESOPHAGEAL SPASM 05/20/2007  . HIATAL HERNIA 05/20/2007  . AMAUROSIS FUGAX 05/07/2007  . COPD mixed type (HCC) 03/27/2007  . HYPERCHOLESTEROLEMIA, PURE 12/10/2006  . SYMPTOM, SYNDROME, CHRONIC FATIGUE 12/10/2006   Past Medical History:  Diagnosis Date  . Amaurosis fugax   . Anemia    hx  . Anxiety   . Arthritis   . Asthma   . Cataract    . Chronic fatigue syndrome   . Chronic kidney disease    frequency, nephritis when 69 yrs old  . COPD (chronic obstructive pulmonary disease) (HCC)   . Depression   . Diverticulitis   . Emphysema of lung (HCC)   . Fibromyalgia   . GERD (gastroesophageal reflux disease)    occ  . H/O hiatal hernia   . History of bronchitis   . Hyperlipidemia   . Hypothyroidism   . Interstitial cystitis   . Irritable bowel syndrome   . Lichen sclerosus   . Lumbar herniated disc   . Migraines   . Pneumonia   . PONV (postoperative nausea and vomiting)   . Shortness of breath    exertion   . Thyroid disease    [place]  . Urinary frequency   . Urinary tract infection   . Vertigo    Past Surgical History:  Procedure Laterality Date  .  ABDOMINAL HYSTERECTOMY    . bladder sugery     . CAROTID DOPPLAR    . COLONOSCOPY  05/26/2010   avms- otherwise nl , re check 10y  . DEXA-OSTEOPENIA    . DOPPLER ECHOCARDIOGRAPHY    . EPICONDYLITIS    . EYE SURGERY Bilateral    cataracts  . FOOT SURGERY Bilateral   . KNEE SURGERY     Left cartilage  . lipoma in second finger right hand    . PLANTAR FASCIA SURGERY Left   . ROTATOR CUFF REPAIR Left 12/2017  . TEMPOROMANDIBULAR JOINT SURGERY    . TONSILLECTOMY    . TOTAL HIP ARTHROPLASTY Right 03/20/2013   Procedure: Right TOTAL HIP ARTHROPLASTY;  Surgeon: [name], MD;  Location: [facility] OR;  Service: Orthopedics;  Laterality: Right;  Right Total Hip Arthroplasty  . TOTAL KNEE ARTHROPLASTY Left 01/28/2015   Procedure: LEFT TOTAL KNEE ARTHROPLASTY;  Surgeon: [name], MD;  Location: [facility];  Service: Orthopedics;  Laterality: Left;  . TRIGGER FINGER RELEASE Right   . TROCHANTERIC BURSA EXCISION Right 05/03/2016   Dr. [name], [name]  . WRIST ARTHROSCOPY Right    ligament tear   Social History   Tobacco Use  . Smoking status: Former Smoker    Packs/day: 2.00    Years: 43.00    Pack years: 86.00    Types: Cigarettes    Quit date:  02/26/2013    Years since quitting: 6.3  . Smokeless tobacco: Never Used  . Tobacco comment: "vaping now"  Substance Use Topics  . Alcohol use: No    Alcohol/week: 0.0 standard drinks  . Drug use: No   Family History  Problem Relation Age of Onset  . Thyroid disease Mother   . Hypertension Mother   . Kidney disease Mother   . Cancer Father   . Colon cancer Other    Allergies  Allergen Reactions  . Lithium Anaphylaxis  . Tegretol [Carbamazepine] Other (See Comments)    Fever and body aches (fever over 103)  . Tricyclic Antidepressants Anaphylaxis  . Strawberry Extract Hives  . Codeine Nausea Only    Makes pt stay awake  . Cymbalta [Duloxetine Hcl] Other (See Comments)    Makes pt pass out   . Erythromycin     REACTION: abdominal pain  . Lyrica [Pregabalin]     Felt faint  . Neurontin [Gabapentin]     Passes  out  . Rabeprazole Sodium     REACTION: insomnia  . Duraprep [employer], Misc.] Itching and Rash    Tolerates Betadine   . Penicillins Rash    Has patient had a PCN reaction causing immediate rash, facial/tongue/throat swelling, SOB or lightheadedness with hypotension: Yes Has patient had a PCN reaction causing severe rash involving mucus membranes or skin necrosis: No Has patient had a PCN reaction that required hospitalization No Has patient had a PCN reaction occurring within the last 10 years: No If all of the above answers are "NO", then may proceed with Cephalosporin use.   . Tape Rash    PT ALLERGIC NYLON TAPE    Current Outpatient Medications on File Prior to Visit  Medication Sig Dispense Refill  . albuterol (PROAIR HFA) 108 (90 Base) MCG/ACT inhaler Inhale 2 puffs into the lungs every 4 (four) hours as needed.    Marland Kitchen buPROPion (WELLBUTRIN XL) 150 MG 24 hr tablet Take 150 mg by mouth daily.      . busPIRone (BUSPAR) 15 MG tablet Take  15 mg by mouth 2 (two) times daily before a meal.     . Cholecalciferol (VITAMIN D3) 2000 UNITS TABS Take 2,000  Units by mouth daily.    . clobetasol cream (TEMOVATE) 0.05 % APPLY SMALL AMOUNT TO AFFECTED AREAS TWO TO THREE TIMES PER WEEK 30 g 4  . cyanocobalamin 2000 MCG tablet Take 2,000 mcg by mouth daily.    . divalproex (DEPAKOTE ER) 250 MG 24 hr tablet Take 500 mg by mouth daily.    . fluticasone (FLONASE) 50 MCG/ACT nasal spray PLACE 2 SPRAYS INTO THE NOSE DAILY. 16 g 11  . HYDROcodone-acetaminophen (NORCO) 7.5-325 MG tablet Take 1-2 tablets by mouth every 6 (six) hours as needed for moderate pain. 30 tablet 0  . levothyroxine (SYNTHROID) 50 MCG tablet TAKE 1 TABLET (50 MCG TOTAL) BY MOUTH DAILY. 30 tablet 11  . LORazepam (ATIVAN) 1 MG tablet Take 1 mg by mouth 2 (two) times daily as needed for anxiety.    . mirtazapine (REMERON) 30 MG tablet Take 30 mg by mouth at bedtime.     . ondansetron (ZOFRAN ODT) 4 MG disintegrating tablet Take 1 tablet (4 mg total) by mouth every 8 (eight) hours as needed for nausea or vomiting. 20 tablet 0  . PREMARIN 0.625 MG tablet TAKE 1 TABLET BY MOUTH DAILY 30 tablet 10  . sertraline (ZOLOFT) 100 MG tablet Take 100 mg by mouth daily.     Marland Kitchen tiZANidine (ZANAFLEX) 2 MG tablet Take 1 tablet (2 mg total) by mouth every 8 (eight) hours as needed for muscle spasms. 30 tablet 0   No current facility-administered medications on file prior to visit.    Review of Systems  Constitutional: Negative for activity change, appetite change, fatigue, fever and unexpected weight change.  HENT: Negative for congestion, ear pain, rhinorrhea, sinus pressure and sore throat.   Eyes: Negative for pain, redness and visual disturbance.  Respiratory: Positive for shortness of breath. Negative for cough and wheezing.        Baseline sob on exertion   Cardiovascular: Negative for chest pain and palpitations.       Pedal edema- on and off /not today  Gastrointestinal: Negative for abdominal pain, blood in stool, constipation and diarrhea.  Endocrine: Negative for polydipsia and polyuria.    Genitourinary: Negative for dysuria, frequency and urgency.  Musculoskeletal: Positive for arthralgias, back pain and gait problem. Negative for myalgias.  Skin: Negative for pallor and rash.  Allergic/Immunologic: Negative for environmental allergies.  Neurological: Negative for dizziness, syncope and headaches.  Hematological: Negative for adenopathy. Does not bruise/bleed easily.  Psychiatric/Behavioral: Negative for decreased concentration and dysphoric mood. The patient is not nervous/anxious.        Objective:   Physical Exam Constitutional:      General: She is not in acute distress.    Appearance: Normal appearance. She is well-developed. She is obese. She is not ill-appearing or diaphoretic.  HENT:     Head: Normocephalic and atraumatic.     Right Ear: Tympanic membrane, ear canal and external ear normal.     Left Ear: Tympanic membrane, ear canal and external ear normal.     Nose: Nose normal. No congestion.     Mouth/Throat:     Mouth: Mucous membranes are moist.     Pharynx: Oropharynx is clear. No posterior oropharyngeal erythema.  Eyes:     General: No scleral icterus.    Extraocular Movements: Extraocular movements intact.     Conjunctiva/sclera: Conjunctivae normal.  Pupils: Pupils are equal, round, and reactive to light.  Neck:     Thyroid: No thyromegaly.     Vascular: No carotid bruit or JVD.  Cardiovascular:     Rate and Rhythm: Normal rate and regular rhythm.     Pulses: Normal pulses.     Heart sounds: Normal heart sounds. No gallop.   Pulmonary:     Effort: Pulmonary effort is normal. No respiratory distress.     Breath sounds: Normal breath sounds. No wheezing.     Comments: Good air exch Diffusely distant bs  Not sob at rest Chest:     Chest wall: No tenderness.  Abdominal:     General: Bowel sounds are normal. There is no distension or abdominal bruit.     Palpations: Abdomen is soft. There is no mass.     Tenderness: There is no abdominal  tenderness.     Hernia: No hernia is present.  Genitourinary:    Comments: Breast exam: No mass, nodules, thickening, tenderness, bulging, retraction, inflamation, nipple discharge or skin changes noted.  No axillary or clavicular LA.     Musculoskeletal:        General: No tenderness. Normal range of motion.     Cervical back: Normal range of motion and neck supple. No rigidity. No muscular tenderness.     Right lower leg: No edema.     Left lower leg: No edema.     Comments: Limited rom of L knee and both shoulders/elbows Needs asisist to get on table  Lymphadenopathy:     Cervical: No cervical adenopathy.  Skin:    General: Skin is warm and dry.     Coloration: Skin is not pale.     Findings: No erythema or rash.     Comments: Solar lentigines diffusely Some angiomas  Neurological:     Mental Status: She is alert. Mental status is at baseline.     Cranial Nerves: No cranial nerve deficit.     Motor: No abnormal muscle tone.     Coordination: Coordination normal.     Gait: Gait normal.     Deep Tendon Reflexes: Reflexes are normal and symmetric. Reflexes normal.  Psychiatric:        Mood and Affect: Mood normal.        Cognition and Memory: Cognition and memory normal.           Assessment & Plan:   Problem List Items Addressed This Visit      Respiratory   COPD mixed type ([place])    Per pt stable (has been more sob on exertion since covid but gradually improving Also deconditioned      Relevant Medications   albuterol (PROAIR HFA) 108 (90 Base) MCG/ACT inhaler     Endocrine   Hypothyroid    TSH today No clinical changes except for exercise intolerance after covid      Relevant Orders   TSH     Musculoskeletal and Integument   Osteopenia   Lichen sclerosus et atrophicus of the vulva    Pt has gyn f/u planned Stable per her        Other   HYPERCHOLESTEROLEMIA, PURE    Disc goals for lipids and reasons to control them Rev last labs with pt Rev low  sat fat diet in detail Simvastatin and diet  Labs ordered today      Relevant Medications   simvastatin (ZOCOR) 20 MG tablet   Other Relevant Orders   Comprehensive  metabolic panel   Lipid panel   Encounter for Medicare annual wellness exam - Primary    Reviewed health habits including diet and exercise and skin cancer prevention Reviewed appropriate screening tests for age  Also reviewed health mt list, fam hx and immunization status , as well as social and family history   See HPI Labs ordered for chronic health problems  Mammogram due in June/pt aware Also has gyn f/u planned 2nd covid vaccine is pending  Stable bmd 5/19 -no falls or fx (D level done today) Unable to exercise due to orthopedic problems  Will work on [facility] given  No cognitive concerns Hearing screen reviewed/no protlems sited by ppt utd eye exam  2nd bp check is improved'        Vitamin D deficiency    Level today  Reassuring dexa 5/19  Disc imp to bone and overall health      Relevant Orders   VITAMIN D 25 Hydroxy (Vit-D Deficiency, Fractures)   Obesity    Discussed how this problem influences overall health and the risks it imposes  Reviewed plan for weight loss with lower calorie diet (via better food choices and also portion control or program like weight watchers) and exercise building up to or more than 30 minutes 5 days per week including some aerobic activity   Limited exercise- recommend further red in calorie intake      Pedal edema    Stable and tolerated Labs today  Recommend leg elevation      Relevant Orders   CBC with Differential/Platelet   Comprehensive metabolic panel

## 2019-07-03 NOTE — Assessment & Plan Note (Signed)
Level today  Reassuring dexa 5/19  Disc imp to bone and overall health

## 2019-07-03 NOTE — Assessment & Plan Note (Signed)
Pt has gyn f/u planned Stable per her

## 2019-07-03 NOTE — Assessment & Plan Note (Signed)
TSH today No clinical changes except for exercise intolerance after covid

## 2019-07-03 NOTE — Assessment & Plan Note (Signed)
Disc goals for lipids and reasons to control them Rev last labs with pt Rev low sat fat diet in detail Simvastatin and diet  Labs ordered today

## 2019-07-03 NOTE — Patient Instructions (Addendum)
Mammogram is due in June- you can schedule that yourself  Please work on your power of attorney - see the blue packet   Stay as active as you be  Eat a healthy balanced diet   Avoid the high dose flu shot in the future   Get your 2nd covid vaccine

## 2019-07-03 NOTE — Telephone Encounter (Signed)
TSH was in normal range so med refilled

## 2019-07-03 NOTE — Assessment & Plan Note (Signed)
Discussed how this problem influences overall health and the risks it imposes  Reviewed plan for weight loss with lower calorie diet (via better food choices and also portion control or program like weight watchers) and exercise building up to or more than 30 minutes 5 days per week including some aerobic activity   Limited exercise- recommend further red in calorie intake

## 2019-07-03 NOTE — Assessment & Plan Note (Signed)
Stable and tolerated Labs today  Recommend leg elevation

## 2019-07-03 NOTE — Assessment & Plan Note (Signed)
Per pt stable (has been more sob on exertion since covid but gradually improving Also deconditioned

## 2019-07-03 NOTE — Assessment & Plan Note (Signed)
Reviewed health habits including diet and exercise and skin cancer prevention Reviewed appropriate screening tests for age  Also reviewed health mt list, fam hx and immunization status , as well as social and family history   See HPI Labs ordered for chronic health problems  Mammogram due in June/pt aware Also has gyn f/u planned 2nd covid vaccine is pending  Stable bmd 5/19 -no falls or fx (D level done today) Unable to exercise due to orthopedic problems  Will work on [facility] given  No cognitive concerns Hearing screen reviewed/no protlems sited by ppt utd eye exam  2nd bp check is improved'

## 2019-07-08 ENCOUNTER — Other Ambulatory Visit: Payer: Self-pay | Admitting: Family Medicine

## 2019-07-08 DIAGNOSIS — Z1231 Encounter for screening mammogram for malignant neoplasm of breast: Secondary | ICD-10-CM

## 2019-07-23 ENCOUNTER — Other Ambulatory Visit: Payer: Self-pay | Admitting: Family Medicine

## 2019-08-05 ENCOUNTER — Encounter: Payer: Self-pay | Admitting: Family Medicine

## 2019-08-24 ENCOUNTER — Encounter: Payer: Self-pay | Admitting: Obstetrics & Gynecology

## 2019-08-24 ENCOUNTER — Ambulatory Visit (INDEPENDENT_AMBULATORY_CARE_PROVIDER_SITE_OTHER): Payer: Medicare Other | Admitting: Obstetrics & Gynecology

## 2019-08-24 ENCOUNTER — Other Ambulatory Visit: Payer: Self-pay

## 2019-08-24 VITALS — BP 142/86 | Ht 68.0 in | Wt 211.0 lb

## 2019-08-24 DIAGNOSIS — R0602 Shortness of breath: Secondary | ICD-10-CM

## 2019-08-24 DIAGNOSIS — N904 Leukoplakia of vulva: Secondary | ICD-10-CM

## 2019-08-24 DIAGNOSIS — R232 Flushing: Secondary | ICD-10-CM

## 2019-08-24 DIAGNOSIS — Z20822 Contact with and (suspected) exposure to covid-19: Secondary | ICD-10-CM

## 2019-08-24 MED ORDER — CLOBETASOL PROPIONATE 0.05 % EX CREA: TOPICAL_CREAM | CUTANEOUS | 4 refills | Status: DC

## 2019-08-24 MED ORDER — PREMARIN 0.625 MG PO TABS: 0.6250 mg | ORAL_TABLET | Freq: Every day | ORAL | 12 refills | Status: DC

## 2019-08-24 NOTE — Progress Notes (Signed)
   Subjective:    Patient ID: Lori Orozco, female    DOB: 08-26-1950, 69 y.o.   MRN: [id number]  HPI  Patient is 69 year old female who presents for refill of Premarin as well as evaluation for lichen sclerosus.  Patient's been on Premarin for many years and would like to continue to be on it.  She has had hysterectomy in the past.  She understands increased risk of breast cancer cerebrovascular accidents and DVT.  She opts to accept these risks because her hot flashes are debilitating.  Patient takes Temovate 2-3 times a week.  She prefers the cream over the ointment.  She has not had any increased itching.  She has not seen any changes in her external genitalia.  Patient did have Covid in January and experiences long-term shortness of breath  Review of Systems  Constitutional: Negative.   Respiratory: Positive for shortness of breath.   Genitourinary: Negative.   Psychiatric/Behavioral: Negative.        Objective:   Physical Exam Vitals reviewed.  Constitutional:      General: She is not in acute distress.    Appearance: She is well-developed.  HENT:     Head: Normocephalic and atraumatic.  Eyes:     Conjunctiva/sclera: Conjunctivae normal.  Cardiovascular:     Rate and Rhythm: Normal rate.  Pulmonary:     Effort: Pulmonary effort is normal.  Genitourinary:    Comments: Tanner V Vulva:  Labia majora--fordyce spots; labia minor--architecture distorted and fusion of labia minora to majora.  Can visualize urethra. Bimanual deferred.    Skin:    General: Skin is warm and dry.  Neurological:     Mental Status: She is alert and oriented to person, place, and time.     Assessment & Plan:  69 year old female with postmenopausal symptoms and lichen sclerosus  1.  Postmenopausal symptoms:  See HPI; premarin renewed 2.  LS:  Condition is stable.  Refill Temovate. 3.  RTC:  1 year or with symptoms.    25 minutes spent with patient for exam, counseling and documentation.

## 2019-08-24 NOTE — Progress Notes (Signed)
   Subjective:    Patient ID: Lori Orozco, female    DOB: November 18, 1950, 69 y.o.   MRN: [id number]  HPI    Review of Systems     Objective:   Physical Exam        Assessment & Plan:

## 2019-08-26 ENCOUNTER — Ambulatory Visit (INDEPENDENT_AMBULATORY_CARE_PROVIDER_SITE_OTHER): Payer: Medicare Other

## 2019-08-26 DIAGNOSIS — Z1231 Encounter for screening mammogram for malignant neoplasm of breast: Secondary | ICD-10-CM | POA: Diagnosis not present

## 2019-09-15 ENCOUNTER — Other Ambulatory Visit: Payer: Self-pay

## 2019-09-15 ENCOUNTER — Ambulatory Visit (INDEPENDENT_AMBULATORY_CARE_PROVIDER_SITE_OTHER): Payer: Medicare Other | Admitting: Orthopaedic Surgery

## 2019-09-15 ENCOUNTER — Encounter: Payer: Self-pay | Admitting: Orthopaedic Surgery

## 2019-09-15 VITALS — Ht 68.0 in | Wt 211.0 lb

## 2019-09-15 DIAGNOSIS — G8929 Other chronic pain: Secondary | ICD-10-CM | POA: Diagnosis not present

## 2019-09-15 DIAGNOSIS — M25512 Pain in left shoulder: Secondary | ICD-10-CM

## 2019-09-15 MED ORDER — METHYLPREDNISOLONE ACETATE 40 MG/ML IJ SUSP
40.0000 mg | INTRAMUSCULAR | Status: AC | PRN
  Administered 2019-09-15: 40 mg via INTRA_ARTICULAR

## 2019-09-15 MED ORDER — LIDOCAINE HCL 1 % IJ SOLN
3.0000 mL | INTRAMUSCULAR | Status: AC | PRN
  Administered 2019-09-15: 3 mL

## 2019-09-15 NOTE — Progress Notes (Signed)
Office Visit Note   Patient: Lori Orozco           Date of Birth: 12-21-1950           MRN: [id number] Visit Date: 09/15/2019              Requested by: Tower, Audrie Gallus, MD [address] [place],  [place] [address] PCP: [name], MD   Assessment & Plan: Visit Diagnoses:  1. Chronic left shoulder pain     Plan: I did provide a steroid injection of her left shoulder subacromial space per her wish.  I will see her back as needed.  If she continues to have shoulder issues I would like her to call our office and then we will set her up appointment with Dr. August Saucer in the office to consider potentially the possibility of shoulder replacement.  Follow-Up Instructions: Return if symptoms worsen or fail to improve.   Orders:  Orders Placed This Encounter  Procedures  . Large Joint Inj  . Large Joint Inj   No orders of the defined types were placed in this encounter.     Procedures: Large Joint Inj: L subacromial bursa on 09/15/2019 11:47 AM Indications: pain and diagnostic evaluation Details: 22 G 1.5 in needle  Arthrogram: No  Medications: 3 mL lidocaine 1 %; 40 mg methylPREDNISolone acetate 40 MG/ML Outcome: tolerated well, no immediate complications Procedure, treatment alternatives, risks and benefits explained, specific risks discussed. Consent was given by the patient. Immediately prior to procedure a time out was called to verify the correct patient, procedure, equipment, support staff and site/side marked as required. Patient was prepped and draped in the usual sterile fashion.       Clinical Data: No additional findings.   Subjective: Chief Complaint  Patient presents with  . Left Shoulder - Pain  The patient is well-known to me.  She has had at least 2 surgeries on her left shoulder and has been dealing with chronic shoulder pain.  She has had full-thickness rotator cuff repairs as well as a rotator cuff repair on the second look after worsening  tearing.  She is a very active 69 year old female.  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  She comes in today requesting a steroid injection in her left shoulder due to upcoming travels.  HPI  Review of Systems She currently denies any headache, chest pain, shortness of breath, fever, chills, nausea, vomiting  Objective: Vital Signs: Ht 5\' 8"  (1.727 m)   Wt 211 lb (95.7 kg)   BMI 32.08 kg/m   Physical Exam She is alert and orient x3 and in no acute distress Ortho Exam Examination of her left shoulder shows grinding I believe that the humeral head with the undersurface of the acromion.  Is very tight shoulder and painful with limited strength and mobility. Specialty Comments:  No specialty comments available.  Imaging: No results found.   PMFS History: Patient Active Problem List   Diagnosis Date Noted  . Rash and nonspecific skin eruption 12/17/2018  . Fever 09/30/2018  . Status post arthroscopy of left shoulder 01/16/2018  . Estrogen deficiency 06/28/2017  . Medial epicondylitis, left elbow 04/29/2017  . S/P arthroscopy of left shoulder 02/25/2017  . Impingement syndrome of left shoulder 01/30/2017  . Chronic pain of right knee 11/12/2016  . Pedal edema 10/26/2016  . Trochanteric bursitis, right hip 04/10/2016  . Elevated BP without diagnosis of hypertension  09/02/2015  . Urge incontinence 07/27/2015  . Obesity 04/28/2015  . Need for hepatitis C screening test 04/26/2015  . Screening for HIV (human immunodeficiency virus) 04/26/2015  . Osteoarthritis of left knee 01/28/2015  . Status post total left knee replacement 01/28/2015  . Vitamin D deficiency 04/27/2014  . Seasonal and perennial allergic rhinitis 06/17/2013  . S/P total hip arthroplasty 03/20/2013  . 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 Encounter for Medicare annual wellness exam 11/18/2012  . History of falling 11/18/2012  . Routine general medical examination  at a health care facility 11/10/2012  . 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 Osteopenia 10/19/2011  . Other screening mammogram 10/19/2011  . Hypothyroid 10/19/2011  . ANEMIA 10/11/2009  . PERIPHERAL NEUROPATHY, FEET 08/05/2009  . BACK PAIN 08/05/2009  . HAND PAIN, BILATERAL 08/05/2009  . TREMOR 03/09/2008  . MENOPAUSAL SYNDROME 10/09/2007  . DEPRESSION, MAJOR 05/20/2007  . BIPOLAR AFFECTIVE DISORDER 05/20/2007  . ANXIETY 05/20/2007  . 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 ESOPHAGEAL SPASM 05/20/2007  . HIATAL HERNIA 05/20/2007  . AMAUROSIS FUGAX 05/07/2007  . COPD mixed type (HCC) 03/27/2007  . HYPERCHOLESTEROLEMIA, PURE 12/10/2006  . SYMPTOM, SYNDROME, CHRONIC FATIGUE 12/10/2006   Past Medical History:  Diagnosis Date  . Amaurosis fugax   . Anemia    hx  . Anxiety   . Arthritis   . Asthma   . Cataract   . Chronic fatigue syndrome   . Chronic kidney disease    frequency, nephritis when 69 yrs old  . COPD (chronic obstructive pulmonary disease) (HCC)   . COVID-19   . Depression   . Diverticulitis   . Emphysema of lung (HCC)   . Fibromyalgia   . GERD (gastroesophageal reflux disease)    occ  . H/O hiatal hernia   . History of bronchitis   . Hyperlipidemia   . Hypothyroidism   . Interstitial cystitis   . Irritable bowel syndrome   . Lichen sclerosus   . Lumbar herniated disc   . Migraines   . Pneumonia   . PONV (postoperative nausea and vomiting)   . Shortness of breath    exertion   . Thyroid disease    Gra[place]  . Urinary frequency   . Urinary tract infection   . Vertigo     Family History  Problem Relation Age of Onset  . Thyroid disease Mother   . Hypertension Mother   . Kidney disease Mother   . Cancer Father   . Colon cancer Other     Past Surgical History:  Procedure Laterality Date  . ABDOMINAL HYSTERECTOMY    . bladder sugery     . CAROTID DOPPLAR    . COLONOSCOPY  05/26/2010   avms- otherwise nl , re check 10y  .  DEXA-OSTEOPENIA    . DOPPLER ECHOCARDIOGRAPHY    . elbow surgery    . EPICONDYLITIS    . EYE SURGERY Bilateral    cataracts  . FOOT SURGERY Bilateral   . KNEE SURGERY     Left cartilage  . lipoma in second finger right hand    . PLANTAR FASCIA SURGERY Left   . ROTATOR CUFF REPAIR Left 12/2017  . TEMPOROMANDIBULAR JOINT SURGERY    . TONSILLECTOMY    . TOTAL HIP ARTHROPLASTY Right 03/20/2013   Procedure: Right TOTAL HIP ARTHROPLASTY;  Surgeon: [name], MD;  Location: [facility] OR;  Service: Orthopedics;  Laterality: Right;  Right Total Hip  Arthroplasty  . TOTAL KNEE ARTHROPLASTY Left 01/28/2015   Procedure: LEFT TOTAL KNEE ARTHROPLASTY;  Surgeon: [name], MD;  Location: [facility];  Service: Orthopedics;  Laterality: Left;  . TRIGGER FINGER RELEASE Right   . TROCHANTERIC BURSA EXCISION Right 05/03/2016   Dr. [name], [name]  . WRIST ARTHROSCOPY Right    ligament tear   Social History   Occupational History  . Not on file  Tobacco Use  . Smoking status: Former Smoker    Packs/day: 2.00    Years: 43.00    Pack years: 86.00    Types: Cigarettes    Quit date: 02/26/2013    Years since quitting: 6.5  . Smokeless tobacco: Never Used  . Tobacco comment: "vaping now"  Vaping Use  . Vaping Use: Some days  Substance and Sexual Activity  . Alcohol use: No    Alcohol/week: 0.0 standard drinks  . Drug use: No  . Sexual activity: Not Currently

## 2019-10-09 ENCOUNTER — Encounter: Payer: Self-pay | Admitting: Family Medicine

## 2019-10-19 IMAGING — MR MRI OF THE LEFT ELBOW WITHOUT CONTRAST
4 of 7 series · 21 of 40 positions shown · non-contrast
Comparison: None.

CLINICAL DATA: Medial elbow pain for 2 years.

EXAM:
MRI OF THE LEFT ELBOW WITHOUT CONTRAST
TECHNIQUE: Multiplanar, multisequence MR imaging of the elbow was performed. No
intravenous contrast was administered.

[Series 4: T2 fat-sat · axial · 3.5mm · 0.27mm/px · z∈[-20,+84]mm · 7 of 23 slices shown (1 of 3)]
[im 1/23]
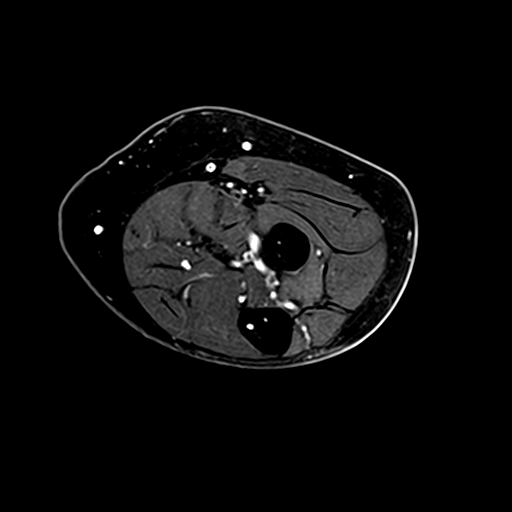
[im 4/23]
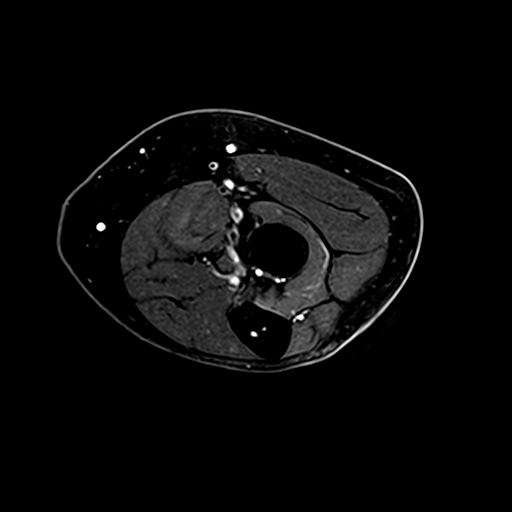
[im 8/23]
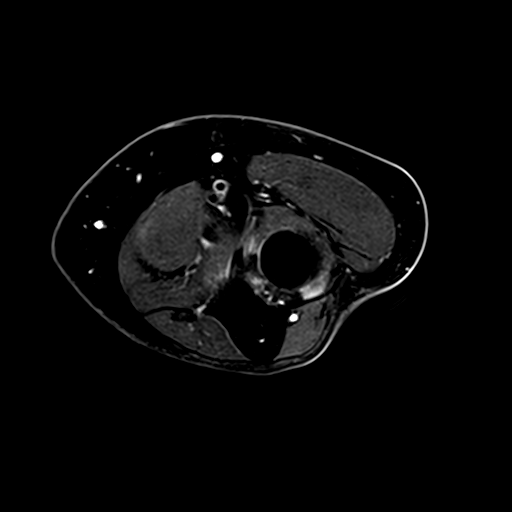
[im 12/23]
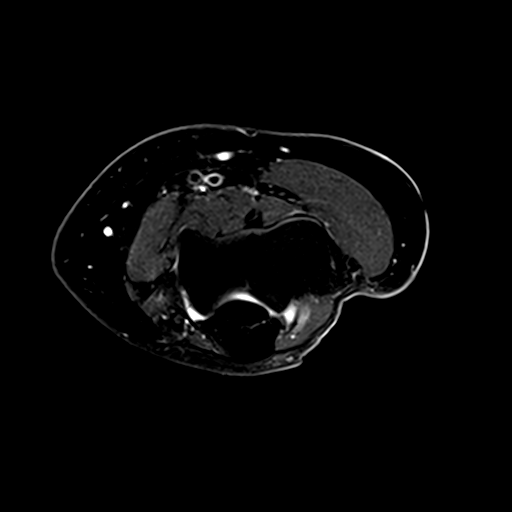
[im 15/23]
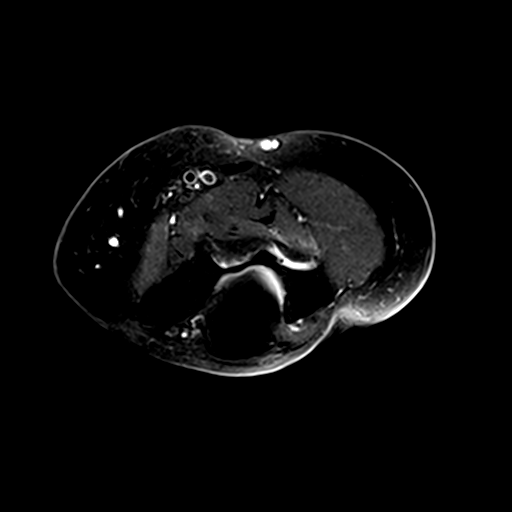
[im 19/23]
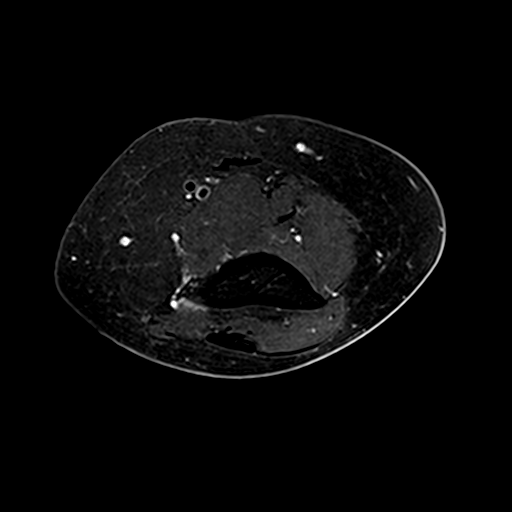
[im 23/23]
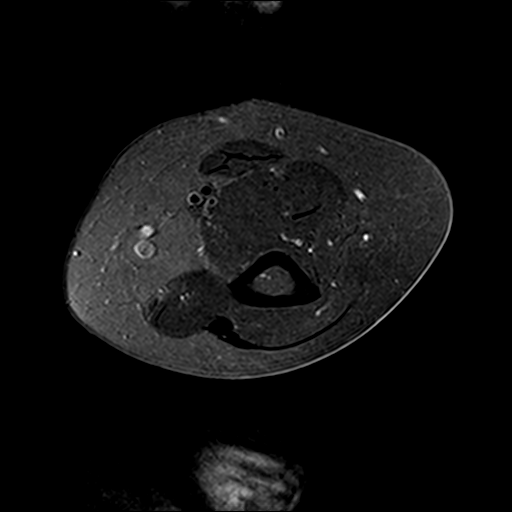

[Series 6: T2 fat-sat · coronal · 3.5mm · 0.27mm/px · 5 of 17 slices shown (2 of 3)]
[im 1/17]
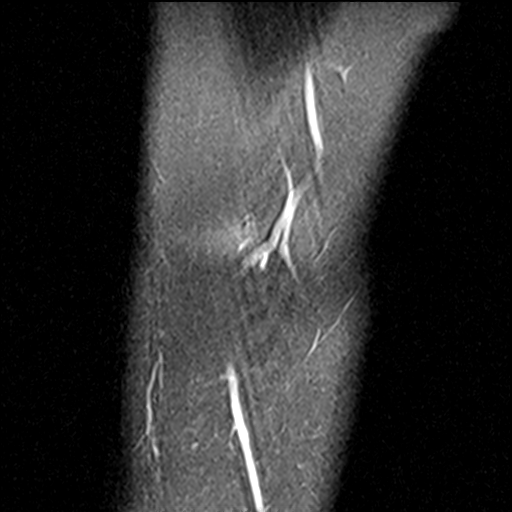
[im 5/17]
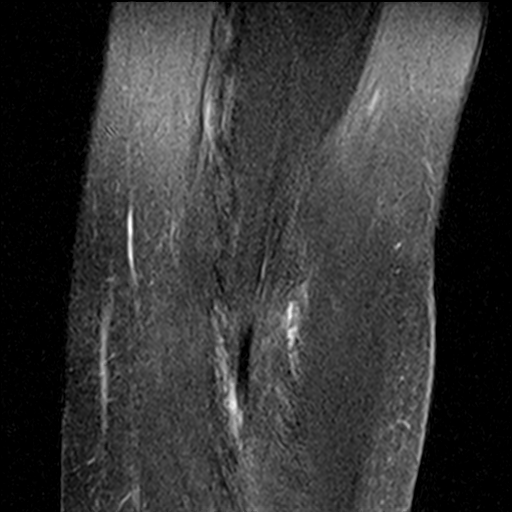
[im 9/17]
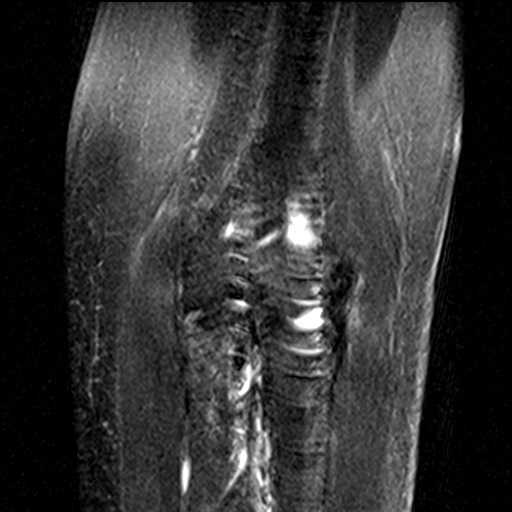
[im 13/17]
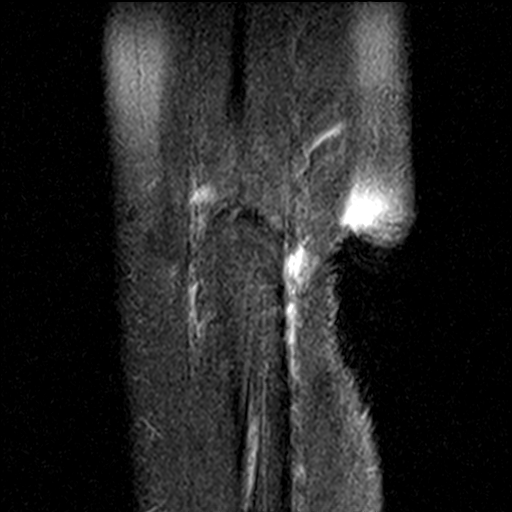
[im 17/17]
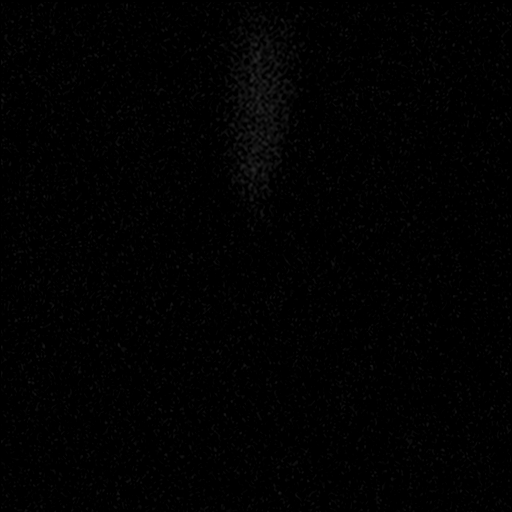

[Series 7: PD fat-sat · sagittal · 3.0mm · 0.27mm/px · 6 of 22 slices shown]
[im 1/22]
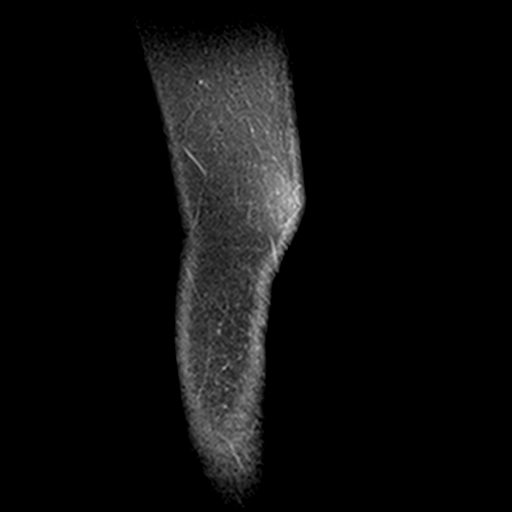
[im 5/22]
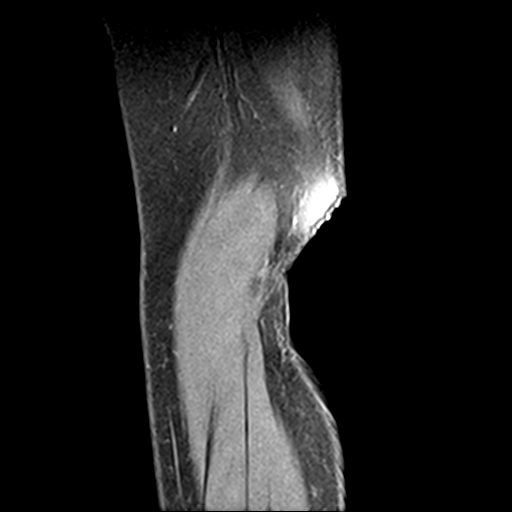
[im 9/22]
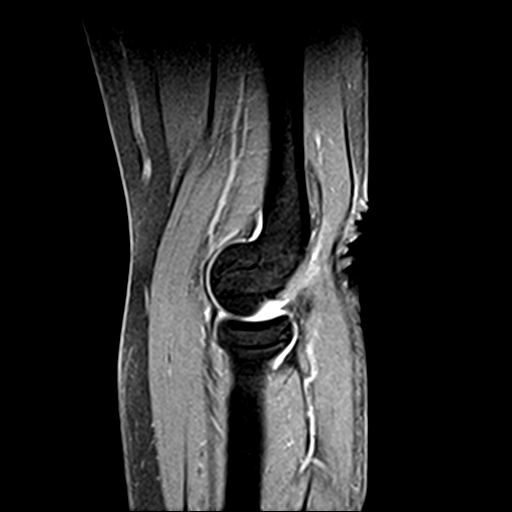
[im 13/22]
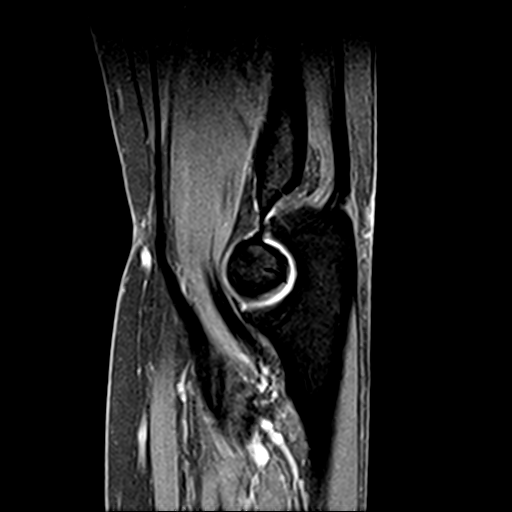
[im 17/22]
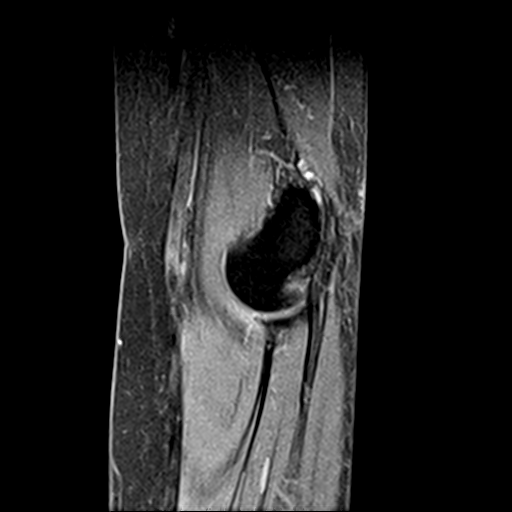
[im 22/22]
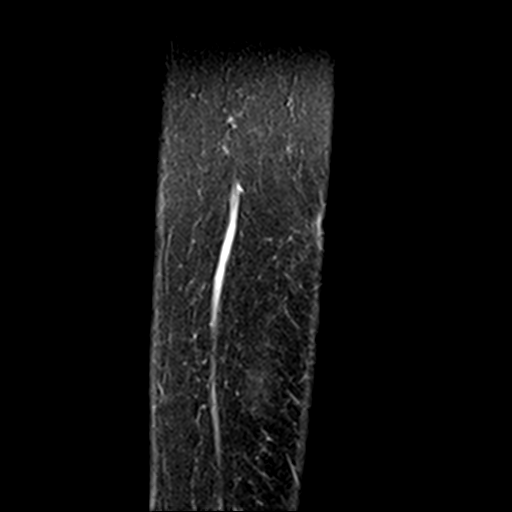

[Series 9: T2 fat-sat · coronal · 3.5mm · 0.36mm/px · 3 of 17 slices shown (3 of 3)]
[im 1/17]
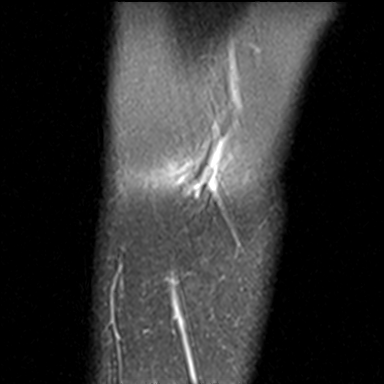
[im 9/17]
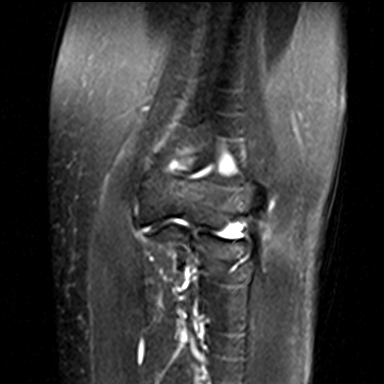
[im 17/17]
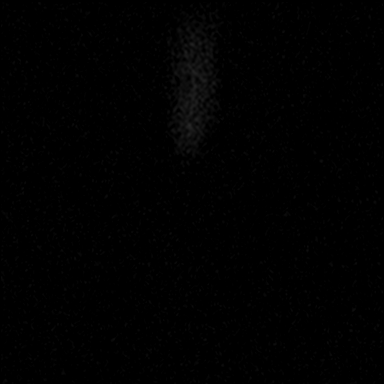

[21 of 40 positions shown; findings below may reference images not displayed]

FINDINGS: TENDONS

Common forearm flexor origin: Normal.

Common forearm extensor origin: Normal.

Biceps: Normal.

Triceps: Normal.

LIGAMENTS

Medial stabilizers: Normal.

Lateral stabilizers:  Normal.

Cartilage: Normal.

Joint: No effusion or loose body.

Cubital tunnel: Normal.  Specifically, the ulnar nerve is normal.

Bones: Normal.
IMPRESSION: Normal MRI of the left elbow.

## 2019-12-01 ENCOUNTER — Telehealth: Payer: Self-pay

## 2019-12-01 NOTE — Telephone Encounter (Signed)
Pt left /vm that she has some concerns of possible covid symptoms; for 4 days pt has body aches, sore muscles,H/A,SOB upon exertion(had sob since + covid in 02/2019) and chills. Pt had moderna covid vaccines on 06/21/19 & 07/19/19. Pt does not have cough, fever,diarrhea, no loss of taste or smell and no/s//t, runny nose or vomiting. Pt no known exposure to + covid. Pt is not in any distress but is not comfortable with achiness and h/a.pt had + covid 03/16/19. Pt  Scheduled my chart video visit with Dr Patsy Lager on 12/02/19 at 9 AM. Pt will have what vital signs she can get ready prior to CMA call. Advised pt to drink plenty of fluids, rest, tylenol for fever (if no allergy to tylenol) and self quarantine until video visit on 12/02/19. UC & ED precautions given and pt voiced understanding.

## 2019-12-02 ENCOUNTER — Other Ambulatory Visit: Payer: Self-pay

## 2019-12-02 ENCOUNTER — Telehealth (INDEPENDENT_AMBULATORY_CARE_PROVIDER_SITE_OTHER): Payer: Medicare Other | Admitting: Family Medicine

## 2019-12-02 ENCOUNTER — Other Ambulatory Visit: Payer: Medicare Other

## 2019-12-02 ENCOUNTER — Encounter: Payer: Self-pay | Admitting: Family Medicine

## 2019-12-02 VITALS — Temp 98.2°F | Ht 66.25 in | Wt 199.0 lb

## 2019-12-02 DIAGNOSIS — R6883 Chills (without fever): Secondary | ICD-10-CM

## 2019-12-02 DIAGNOSIS — Z20822 Contact with and (suspected) exposure to covid-19: Secondary | ICD-10-CM

## 2019-12-02 DIAGNOSIS — R519 Headache, unspecified: Secondary | ICD-10-CM

## 2019-12-02 DIAGNOSIS — R52 Pain, unspecified: Secondary | ICD-10-CM

## 2019-12-02 NOTE — Progress Notes (Signed)
Lori Habermehl T. Zyaire Dumas, MD Primary Care and Sports Medicine [facility] [address] [place], [address] Phone: [phone]  FAX: [phone]  Lori WITTMEYER - 69 y.o. female  MRN [id number]  Date of Birth: 1950-07-25  Visit Date: 12/02/2019  PCP: [name], MD  Referred by: Tower, Audrie Gallus, MD Chief Complaint  Patient presents with  . Generalized Body Aches  . Fatigue  . Chills  . Headache   Virtual Visit via Video Note:  I connected with  Lori Orozco on 12/02/2019  9:00 AM EDT by a video enabled telemedicine application and verified that I am speaking with the correct person using two identifiers.   Location patient: home computer, tablet, or smartphone Location provider: work or home office Consent: Verbal consent directly obtained from Lori Orozco. Persons participating in the virtual visit: patient, provider  I discussed the limitations of evaluation and management by telemedicine and the availability of in person appointments. The patient expressed understanding and agreed to proceed.  Interactive audio and video telecommunications were attempted between this provider and patient, however failed, due to patient having technical difficulties OR patient did not have access to video capability.  We continued and completed visit with audio only.    History of Present Illness:  Aches, sore, weak.  Ha. No fever.  Feels some chills. 4 days.  Globally she does not really feel all that well.  ? covid-19. Mask usage.  Bowling again this year.  She has been doing this regularly and routinely.  She does always wear a mask, but multiple people in the bowling alley do not.  No. Gi.  Not eating much. Chest feels a little heavy. She does not have any chest pain or significant shortness of breath.  Does have some inhalers.  Immunization History  Administered Date(s) Administered  . Influenza Split 11/19/2011,  11/02/2013  . Influenza Whole 12/20/2006, 11/25/2008, 11/26/2009, 11/24/2010  . Influenza, High Dose Seasonal PF 12/13/2016  . Influenza,inj,Quad PF,6+ Mos 12/06/2015  . Influenza-Unspecified 12/08/2012, 12/03/2014, 12/08/2017, 11/10/2018, 10/31/2019  . Moderna SARS-COVID-2 Vaccination 06/21/2019, 07/19/2019  . Pneumococcal Conjugate-13 12/06/2015  . Pneumococcal Polysaccharide-23 01/26/2002, 03/23/2008, 09/07/2010, 06/28/2017  . Td 06/26/2000, 10/19/2011  . Zoster Recombinat (Shingrix) 09/16/2017     Review of Systems as above: See pertinent positives and pertinent negatives per HPI No acute distress verbally   Observations/Objective/Exam:  An attempt was made to discern vital signs over the phone and per patient if applicable and possible.   General:    Alert, Oriented, appears well and in no acute distress  Pulmonary:     On inspection no signs of respiratory distress.  Psych / Neurological:     Pleasant and cooperative.  Assessment and Plan:    ICD-10-CM   1. Body aches  R52   2. Chills  R68.83   3. Acute nonintractable headache, unspecified headache type  R51.9    I think given the current climate and COVID-19 outbreak in our community, the primary concern would be for her to get tested for COVID-19, and she agrees.  She does have some Ventolin at home to use if she needs to for cough or wheezing.  I discussed the assessment and treatment plan with the patient. The patient was provided an opportunity to ask questions and all were answered. The patient agreed with the plan and demonstrated an understanding of the instructions.   The patient was advised to call back or seek  an in-person evaluation if the symptoms worsen or if the condition fails to improve as anticipated.  Follow-up: prn unless noted otherwise below No follow-ups on file.  No orders of the defined types were placed in this encounter.  No orders of the defined types were placed in this  encounter.   Signed,  Elpidio Galea. Salvatrice Morandi, MD

## 2019-12-03 LAB — NOVEL CORONAVIRUS, NAA: SARS-CoV-2, NAA: NOT DETECTED

## 2019-12-03 LAB — SARS-COV-2, NAA 2 DAY TAT

## 2019-12-15 ENCOUNTER — Other Ambulatory Visit: Payer: Self-pay

## 2019-12-15 ENCOUNTER — Ambulatory Visit (INDEPENDENT_AMBULATORY_CARE_PROVIDER_SITE_OTHER): Payer: Medicare Other

## 2019-12-15 DIAGNOSIS — R911 Solitary pulmonary nodule: Secondary | ICD-10-CM

## 2019-12-16 ENCOUNTER — Other Ambulatory Visit: Payer: Self-pay

## 2019-12-16 ENCOUNTER — Telehealth: Payer: Self-pay | Admitting: Orthopaedic Surgery

## 2019-12-16 DIAGNOSIS — G8929 Other chronic pain: Secondary | ICD-10-CM

## 2019-12-16 NOTE — Telephone Encounter (Signed)
Spoke with patient and put referral in for Dr.Dean.

## 2019-12-16 NOTE — Telephone Encounter (Signed)
She actually needs to see Dr. August Saucer as a patient first so he can evaluate her left shoulder to determine whether or not he feels the shoulder replacement is warranted.  That way he would be able to talk about the types of shoulder replacements with her as well as schedule her surgery.  She needs to set up an appointment with him and be seen in the office by him.

## 2019-12-16 NOTE — Telephone Encounter (Signed)
Pt called stating she would like for Dr.Blackman to talk to Dr.Dean about a shoulder replacement, then she would like a CB from Dr.Blackman to discuss a date for surgery   [phone]

## 2019-12-17 ENCOUNTER — Encounter: Payer: Self-pay | Admitting: Internal Medicine

## 2019-12-17 ENCOUNTER — Ambulatory Visit (INDEPENDENT_AMBULATORY_CARE_PROVIDER_SITE_OTHER): Payer: Medicare Other | Admitting: Internal Medicine

## 2019-12-17 ENCOUNTER — Other Ambulatory Visit: Payer: Self-pay

## 2019-12-17 DIAGNOSIS — IMO0001 Reserved for inherently not codable concepts without codable children: Secondary | ICD-10-CM

## 2019-12-17 DIAGNOSIS — U071 COVID-19: Secondary | ICD-10-CM | POA: Diagnosis not present

## 2019-12-17 DIAGNOSIS — J449 Chronic obstructive pulmonary disease, unspecified: Secondary | ICD-10-CM

## 2019-12-17 DIAGNOSIS — R911 Solitary pulmonary nodule: Secondary | ICD-10-CM

## 2019-12-17 MED ORDER — UMECLIDINIUM BROMIDE 62.5 MCG/INH IN AEPB: INHALATION_SPRAY | RESPIRATORY_TRACT | 12 refills | Status: DC

## 2019-12-17 NOTE — Progress Notes (Signed)
Subjective:    Patient ID: Lori Orozco, female    DOB: Nov 24, 1950, 69 y.o.   MRN: [id number]  HPI  female former cigarette/ VAPE smoker followed for COPD, complicated by bipolar, GERD,  PFT 10/09/10-  -----------------------------------------------------------------------------   12/15/2018- 69 year old female VAPE smoker followed for COPD, nicotine use, complicated by bipolar disorder ------pt states breathing is at baseline, reports usual DOE Uses rescue inhaler occ, prn. No significant exacerbation. Still vaping some. Overall she seems quite comfortable with her breathing. Discussed Covid precautions and potential vaccine. She is being very careful.  Felt anxious among others in waiting room, despite precautions.  12/17/19- 69 year old female VAPE smoker followed for COPD , Lung Nodules,, nicotine use, Allergic Rhinitis, complicated by bipolar disorder, Hiatal Hernia, Hypothyroid, Peripheral Neuropathy, Covid infection/MAB infusion Jan, 2021,  Flonase, Proair hfa,  Covid vax- 2 Moderna Flu vax- had No acute change. Discussed CT scan. Little cough since she stopped smoking.  Very limited insurance coverage of inhalers. Using only rescue hfa occ.  Discussed CT. CT chest 12/16/19-  IMPRESSION: 1. Stable small RIGHT lower lobe pulmonary nodules over 1 year time consistent benign etiology per Fleischner criteria. 2. Fine micro nodularity in the medial middle lobes consistent with chronic inflammation/infection. No interval change.  ROS-see HPI   + = positive Constitutional:   No-   weight loss, night sweats, fevers, chills, fatigue, lassitude. HEENT:   No-  headaches, difficulty swallowing, tooth/dental problems, sore throat,       No-  sneezing, itching, ear ache, nasal congestion, post nasal drip,  CV:  No-   chest pain, orthopnea, PND, swelling in lower extremities, anasarca, dizziness, palpitations Resp: +shortness of breath with exertion or at rest.              No-  productive cough,   non-productive cough,  No- coughing up of blood.               No-   change in color of mucus.  No- wheezing.   Skin: No-   rash or lesions. GI:  No-   heartburn, indigestion, abdominal pain, nausea, vomiting,  GU: MS:  + joint pain or swelling.  . Neuro-     nothing unusual Psych:  No- change in mood or affect. depression or +anxiety.  No memory loss.  Objective:   Physical Exam  General- Alert, Oriented, Affect-appropriate, Distress- none acute.   Skin- rash-none, lesions- none, excoriation- none Lymphadenopathy- none Head- atraumatic            Eyes- Gross vision intact, PERRLA, conjunctivae clear secretions            Ears- Hearing, canals-normal            Nose- Clear, no-Septal dev, mucus, polyps, erosion, perforation             Throat- Mallampati III-IV , mucosa clear , drainage- none, tonsils- atrophic Neck- flexible , trachea midline, no stridor , thyroid nl, carotid no bruit Chest - symmetrical excursion , unlabored           Heart/CV- RRR , no murmur , no gallop  , no rub, nl s1 s2                           - JVD- none , edema- none, stasis changes- none, varices- none           Lung- +clear/ diminished , unlabored, cough-none , dullness-none, rub- none  Chest wall-   Abd-  Br/ Gen/ Rectal- Not done, not indicated Extrem- cyanosis- none, clubbing, none, atrophy- none, strength- nl Neuro- + Jiggling right leg

## 2019-12-17 NOTE — Patient Instructions (Signed)
Script sent to try Incruse (umeclidinium) Inhale 1 puf once daily- maintenance  See if adding this helps any with the shortness of breath  You can still use the albuterol rescue inhaler 2 Puffs every 6 hors, if needed  Please call if we can help

## 2019-12-18 ENCOUNTER — Telehealth: Payer: Self-pay | Admitting: Internal Medicine

## 2019-12-18 ENCOUNTER — Encounter: Payer: Self-pay | Admitting: Family Medicine

## 2019-12-18 NOTE — Telephone Encounter (Signed)
Called and spoke with patient who states that Incruse is to expensive at $51.00 an inhaler and she can't afford that. Would like something else comparable. Also states she wants Proair taken off her med list because it is not covered by insurance and its not on the list to be covered next year.   Dr. Maple Hudson please advise

## 2019-12-20 NOTE — Telephone Encounter (Signed)
Offer Spiriva 2.5 handihaler    # 1, ref x 12    inhale 2 puffs once daily  Ok to offer albuterol hfa generic   18 g, # 1   inhale 2 puffs every 6 hours if needed        DC Proair

## 2019-12-21 ENCOUNTER — Telehealth: Payer: Self-pay | Admitting: Internal Medicine

## 2019-12-21 DIAGNOSIS — J449 Chronic obstructive pulmonary disease, unspecified: Secondary | ICD-10-CM

## 2019-12-21 MED ORDER — SPIRIVA RESPIMAT 2.5 MCG/ACT IN AERS: 2.0000 | INHALATION_SPRAY | Freq: Every day | RESPIRATORY_TRACT | 11 refills | Status: DC

## 2019-12-21 NOTE — Telephone Encounter (Signed)
Called and spoke with pt who states she is unable to afford the spiriva. Pt said the med is going to cost her over $40 and due to her being on social security and having limited funds, she cannot afford any med that is over $20 due to other meds she is on that she has to take.  Pt also stated she called prescription drug service to discuss this with them and they told her that most inhalers would be around the same price range for her other than her rescue inhaler.  Pt said she has tried pt assistance before and said she would not be approved for pt assistance unless meds were covered by Part D.  Dr. Maple Hudson, please advise.

## 2019-12-21 NOTE — Telephone Encounter (Signed)
Spoke with the pt and notified of response per Dr Maple Hudson  She verbalized understanding  She already has ventolin HFA and states no need to send in rx for albuterol  I have sent in rx for Spiriva  Nothing further needed

## 2019-12-21 NOTE — Telephone Encounter (Signed)
Pt is returning call - CB # [phone]

## 2019-12-21 NOTE — Telephone Encounter (Signed)
Called and spoke with pt letting her know the info stated by CY about getting her established with a DME for nebulizer machine and meds and she verbalized understanding. Stated to pt that we would send this to DME for her as it will be cheaper for her to get meds through DME due to her having medicare.  Order for nebulizer has been placed.  [facility], please let us know what DME this is sent to as if it is one that we can send meds through to the DME pharmacy, we will then send the meds there once we know who the DME is that the order will be sent to.

## 2019-12-21 NOTE — Telephone Encounter (Signed)
If she has Medicare, then our best option is to order new DME, new compressor nebulizer,  with Duoneb 75 ml,  Ref prn           1 neb every 6 hours as needed for breathing.    Medicare should cover cost of this.   CX COPD mixed type

## 2019-12-21 NOTE — Telephone Encounter (Signed)
ATC patient.  LMTCB. 

## 2019-12-22 MED ORDER — IPRATROPIUM-ALBUTEROL 0.5-2.5 (3) MG/3ML IN SOLN: 3.0000 mL | Freq: Four times a day (QID) | RESPIRATORY_TRACT | 99 refills | Status: DC | PRN

## 2019-12-22 NOTE — Telephone Encounter (Signed)
This order was sent to [facility].  I will be happy to fax the Rx to [facility] ([facility]'s pharmacy) if you provide me with the Rx.

## 2019-12-22 NOTE — Telephone Encounter (Signed)
Rx for duoneb sol has been sent electronically to pharmacy. Routing to [place] as an Financial planner. Nothing further needed.

## 2019-12-28 ENCOUNTER — Telehealth: Payer: Self-pay | Admitting: Internal Medicine

## 2019-12-28 ENCOUNTER — Ambulatory Visit: Payer: Medicare Other | Admitting: Orthopedic Surgery

## 2019-12-29 MED ORDER — IPRATROPIUM-ALBUTEROL 0.5-2.5 (3) MG/3ML IN SOLN: 3.0000 mL | Freq: Four times a day (QID) | RESPIRATORY_TRACT | 99 refills | Status: DC | PRN

## 2019-12-29 NOTE — Telephone Encounter (Signed)
Rx has been updated and sent back to [facility]. Nothing further was needed.

## 2020-01-02 DIAGNOSIS — R911 Solitary pulmonary nodule: Secondary | ICD-10-CM | POA: Insufficient documentation

## 2020-01-02 DIAGNOSIS — IMO0001 Reserved for inherently not codable concepts without codable children: Secondary | ICD-10-CM | POA: Insufficient documentation

## 2020-01-02 NOTE — Assessment & Plan Note (Signed)
No evident residual now.

## 2020-01-02 NOTE — Assessment & Plan Note (Signed)
Stable or improved with little bronchitis component now. Do want her to have a maintenance controller if she can afford. Discussed nebulizer. Plan- [facility]- Charyl Dancer

## 2020-01-02 NOTE — Assessment & Plan Note (Signed)
Not symptomatic. Plan- Can watch with imaging over time as needed.

## 2020-01-07 ENCOUNTER — Ambulatory Visit (INDEPENDENT_AMBULATORY_CARE_PROVIDER_SITE_OTHER): Payer: Medicare Other | Admitting: Orthopedic Surgery

## 2020-01-07 ENCOUNTER — Ambulatory Visit (INDEPENDENT_AMBULATORY_CARE_PROVIDER_SITE_OTHER): Payer: Medicare Other

## 2020-01-07 DIAGNOSIS — M25512 Pain in left shoulder: Secondary | ICD-10-CM

## 2020-01-07 DIAGNOSIS — M75122 Complete rotator cuff tear or rupture of left shoulder, not specified as traumatic: Secondary | ICD-10-CM | POA: Diagnosis not present

## 2020-01-10 ENCOUNTER — Encounter: Payer: Self-pay | Admitting: Orthopedic Surgery

## 2020-01-10 NOTE — Progress Notes (Signed)
Office Visit Note   Patient: Lori Orozco           Date of Birth: April 26, 1950           MRN: [id number] Visit Date: 01/07/2020 Requested by: [name], MD [address] [place],  [place] [address] PCP: [name], MD  Subjective: Chief Complaint  Patient presents with  . Left Shoulder - Pain    HPI: Lori Orozco is a 69 y.o. female who presents to the office complaining of left shoulder pain.  Patient is referred from Dr. [name].  Patient complains of left shoulder pain for 2 years and has had 2 prior surgeries on the left shoulder by Dr. [name].  She had MRI in 2019 of her left shoulder that showed a supraspinatus tear with 4.3 cm of retraction of the left shoulder and she states that she had a partial repair of this rotator cuff tear by Dr. [name].  She is right-hand dominant.  She notes constant pain of the left shoulder and pain that wakes her up at night.  She has painful range of motion.  She has decreased active range of motion of the shoulder and receives injections every 3 months.  Her last injection was 4 months ago and provided about 1 week of relief.  She takes ibuprofen with occasional Norco for pain as well as using ice, heat, and TENS unit.  She has had physical therapy with some decrease in pain but nothing significant.  Denies any neck pain or numbness/tingling in the left arm.  She is retired and she likes to bowl using a 10 pound bowling ball.  Denies any cardiac history, diabetes, clot history.  She does have a history of COPD.  Also has history of left total knee arthroplasty and right total hip arthroplasty..                ROS: All systems reviewed are negative as they relate to the chief complaint within the history of present illness.  Patient denies fevers or chills.  Assessment & Plan: Visit Diagnoses:  1. Left shoulder pain, unspecified chronicity   2. Nontraumatic complete tear of left rotator cuff     Plan: Patient is a  69 year old female presents for evaluation of left shoulder pain.  She has history of at least 2 years of pain with this left shoulder.  She had MRI in 2019 that showed supraspinatus tear with 4.3 cm of retraction.  This was subsequently partially repaired by Dr. [name] but she states that she has had persistent pain despite that surgery.  Pain seems to be getting worse.  She wakes up at night and describes constant pain.  Injections only last for about 1 week.denies fevers or signs and sxs of infection.    She is interested in operative management.  Discussed the risks and benefits of the procedure including nerve and vessel damage, shoulder stiffness, prosthetic joint infection, shoulder instability.  Discussed the recovery timeline.  After lengthy discussion, patient wishes to proceed with reverse shoulder arthroplasty of the left shoulder.  Ordered thin cut CT scan of the left shoulder for preoperative planning purposes.  Follow-up after procedure.  Follow-Up Instructions: No follow-ups on file.   Orders:  Orders Placed This Encounter  Procedures  . XR Shoulder Left  . CT SHOULDER LEFT WO CONTRAST   No orders of the defined types were placed in this encounter.     Procedures: No procedures performed   Clinical Data:  No additional findings.  Objective: Vital Signs: There were no vitals taken for this visit.  Physical Exam:  Constitutional: Patient appears well-developed HEENT:  Head: Normocephalic Eyes:EOM are normal Neck: Normal range of motion Cardiovascular: Normal rate Pulmonary/chest: Effort normal Neurologic: Patient is alert Skin: Skin is warm Psychiatric: Patient has normal mood and affect  Ortho Exam: Ortho exam demonstrates left shoulder with 50 degrees external rotation, 100 degrees abduction, 140 degrees forward flexion.  4/5 motor strength of the supraspinatus with 5/5 motor strength of the infraspinatus and subscapularis.  Crepitus is felt with passive range of  motion of the left shoulder.  No tenderness over the axial cervical spine.  5/5 motor strength of the bilateral grip strength, finger abduction, pronation/supination, bicep, tricep, deltoid.  Specialty Comments:  No specialty comments available.  Imaging: No results found.   PMFS History: Patient Active Problem List   Diagnosis Date Noted  . Lung nodule < 6cm on CT 01/02/2020  . Rash and nonspecific skin eruption 12/17/2018  . Fever 09/30/2018  . Status post arthroscopy of left shoulder 01/16/2018  . Estrogen deficiency 06/28/2017  . Medial epicondylitis, left elbow 04/29/2017  . S/P arthroscopy of left shoulder 02/25/2017  . Impingement syndrome of left shoulder 01/30/2017  . Pedal edema 10/26/2016  . Trochanteric bursitis, right hip 04/10/2016  . Elevated BP without diagnosis of hypertension 09/02/2015  . Urge incontinence 07/27/2015  . Obesity 04/28/2015  . Need for hepatitis C screening test 04/26/2015  . Screening for HIV (human immunodeficiency virus) 04/26/2015  . Osteoarthritis of left knee 01/28/2015  . Status post total left knee replacement 01/28/2015  . Vitamin D deficiency 04/27/2014  . Seasonal and perennial allergic rhinitis 06/17/2013  . S/P total hip arthroplasty 03/20/2013  . 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 Encounter for Medicare annual wellness exam 11/18/2012  . History of falling 11/18/2012  . Routine general medical examination at a health care facility 11/10/2012  . 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 Osteopenia 10/19/2011  . Other screening mammogram 10/19/2011  . Hypothyroid 10/19/2011  . ANEMIA 10/11/2009  . PERIPHERAL NEUROPATHY, FEET 08/05/2009  . BACK PAIN 08/05/2009  . HAND PAIN, BILATERAL 08/05/2009  . TREMOR 03/09/2008  . MENOPAUSAL SYNDROME 10/09/2007  . DEPRESSION, MAJOR 05/20/2007  . BIPOLAR AFFECTIVE DISORDER 05/20/2007  . ANXIETY 05/20/2007  . 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  ESOPHAGEAL SPASM 05/20/2007  . HIATAL HERNIA 05/20/2007  . AMAUROSIS FUGAX 05/07/2007  . COPD mixed type (HCC) 03/27/2007  . HYPERCHOLESTEROLEMIA, PURE 12/10/2006  . SYMPTOM, SYNDROME, CHRONIC FATIGUE 12/10/2006   Past Medical History:  Diagnosis Date  . Amaurosis fugax   . Anemia    hx  . Anxiety   . Arthritis   . Asthma   . Cataract   . Chronic fatigue syndrome   . Chronic kidney disease    frequency, nephritis when 69 yrs old  . COPD (chronic obstructive pulmonary disease) (HCC)   . COVID-19   . Depression   . Diverticulitis   . Emphysema of lung (HCC)   . Fibromyalgia   . GERD (gastroesophageal reflux disease)    occ  . H/O hiatal hernia   . History of bronchitis   . Hyperlipidemia   . Hypothyroidism   . Interstitial cystitis   . Irritable bowel syndrome   . Lichen sclerosus   . Lumbar herniated disc   . Migraines   . Pneumonia   . PONV (postoperative nausea and vomiting)   .  Shortness of breath    exertion   . Thyroid disease    [place]  . Urinary frequency   . Urinary tract infection   . Vertigo     Family History  Problem Relation Age of Onset  . Thyroid disease Mother   . Hypertension Mother   . Kidney disease Mother   . Cancer Father   . Colon cancer Other     Past Surgical History:  Procedure Laterality Date  . ABDOMINAL HYSTERECTOMY    . bladder sugery     . CAROTID DOPPLAR    . COLONOSCOPY  05/26/2010   avms- otherwise nl , re check 10y  . DEXA-OSTEOPENIA    . DOPPLER ECHOCARDIOGRAPHY    . elbow surgery    . EPICONDYLITIS    . EYE SURGERY Bilateral    cataracts  . FOOT SURGERY Bilateral   . KNEE SURGERY     Left cartilage  . lipoma in second finger right hand    . PLANTAR FASCIA SURGERY Left   . ROTATOR CUFF REPAIR Left 12/2017  . TEMPOROMANDIBULAR JOINT SURGERY    . TONSILLECTOMY    . TOTAL HIP ARTHROPLASTY Right 03/20/2013   Procedure: Right TOTAL HIP ARTHROPLASTY;  Surgeon: [name], MD;  Location: [facility] OR;  Service:  Orthopedics;  Laterality: Right;  Right Total Hip Arthroplasty  . TOTAL KNEE ARTHROPLASTY Left 01/28/2015   Procedure: LEFT TOTAL KNEE ARTHROPLASTY;  Surgeon: [name], MD;  Location: [facility];  Service: Orthopedics;  Laterality: Left;  . TRIGGER FINGER RELEASE Right   . TROCHANTERIC BURSA EXCISION Right 05/03/2016   Dr. [name], [name]  . WRIST ARTHROSCOPY Right    ligament tear   Social History   Occupational History  . Not on file  Tobacco Use  . Smoking status: Former Smoker    Packs/day: 2.00    Years: 43.00    Pack years: 86.00    Types: Cigarettes    Quit date: 02/26/2013    Years since quitting: 6.8  . Smokeless tobacco: Never Used  . Tobacco comment: "vaping now"  Vaping Use  . Vaping Use: Some days  Substance and Sexual Activity  . Alcohol use: No    Alcohol/week: 0.0 standard drinks  . Drug use: No  . Sexual activity: Not Currently

## 2020-01-11 ENCOUNTER — Encounter: Payer: Self-pay | Admitting: Family Medicine

## 2020-01-14 ENCOUNTER — Other Ambulatory Visit: Payer: Self-pay

## 2020-01-14 ENCOUNTER — Ambulatory Visit
Admission: RE | Admit: 2020-01-14 | Discharge: 2020-01-14 | Disposition: A | Discharge status: Home / Self Care | Payer: Medicare Other | Source: Ambulatory Visit | Attending: Orthopedic Surgery | Admitting: Orthopedic Surgery

## 2020-01-14 DIAGNOSIS — M25512 Pain in left shoulder: Secondary | ICD-10-CM

## 2020-01-20 ENCOUNTER — Other Ambulatory Visit: Payer: Self-pay

## 2020-01-24 ENCOUNTER — Encounter: Payer: Self-pay | Admitting: Family Medicine

## 2020-01-27 ENCOUNTER — Other Ambulatory Visit: Payer: Medicare Other

## 2020-01-27 NOTE — Progress Notes (Signed)
[facility] - [place], [place] - [id number] [place] [address] Phone: [phone] Fax: [phone]  [facility] - [place], [place] - [address] [facility] [address]. [address] [employer]. [address] [place] [address] Phone: [phone] Fax: [phone]      Your procedure is scheduled on Tuesday 02/02/2020.  Report to [facility] Main Entrance "A" at 08:45 A.M., and check in at the Admitting office.  Call this number if you have problems the morning of surgery:  [phone]  Call [phone] if you have any questions prior to your surgery date Monday-Friday 8am-4pm    Remember:  Do not eat after midnight the night before your surgery  You may drink clear liquids until 0745am the morning of your surgery.   Clear liquids allowed are: Water, Non-Citrus Juices (without pulp), Carbonated Beverages, Clear Tea, Black Coffee Only, and Gatorade    Take these medicines the morning of surgery with A SIP OF WATER: Bupropion (Wellbutrin XL) Buspirone (Buspar) Famotidine (Pepcid) Mirabegron ER (Myrbetriq) Sertraline (Zoloft)  If NEEDED you may take the following medications the morning of surgery: Acetaminophen (Tylenol) Albuterol (Ventolin) inhaler - Please bring all inhalers with you the day of surgery.  Fluticasone (Flonase) nasal spray Hydrocodone-acetaminophen (Norco) Duoneb nebulizer Eye drops Lorazepam (Ativan) Ondansetron (Zofran) Tizanidine (Zanaflex)   As of today, STOP taking any Aspirin (unless otherwise instructed by your surgeon) Aleve, Naproxen, Ibuprofen, Motrin, Advil, Goody's, BC's, all herbal medications, fish oil, and all vitamins.                      Do not wear jewelry, make up, or nail polish            Do not wear lotions, powders, perfumes, or deodorant.            Do not shave 48 hours prior to surgery.  Men may shave face and neck.            Do not bring valuables to the hospital.             [facility] is not responsible for any belongings or valuables.  Do NOT Smoke (Tobacco/Vaping) or drink Alcohol 24 hours prior to your procedure  If you use a CPAP at night, you may bring all equipment for your overnight stay.   Contacts, glasses, dentures or bridgework may not be worn into surgery.      For patients admitted to the hospital, discharge time will be determined by your treatment team.   Patients discharged the day of surgery will not be allowed to drive home, and someone needs to stay with them for 24 hours.    Special instructions:    [facility]- Preparing for Total Shoulder Arthroplasty  Before surgery, you can play an important role. Because skin is not sterile, your skin needs to be as free of germs as possible. You can reduce the number of germs on your skin by using the following products.   Benzoyl Peroxide Gel  o Reduces the number of germs present on the skin  o Applied twice a day to shoulder area starting two days before surgery   Chlorhexidine Gluconate (CHG) Soap (instructions listed above on how to wash with CHG Soap)  o An antiseptic cleaner that kills germs and bonds with the skin to continue killing germs even after washing  o Used for showering the night before surgery and morning of surgery   ==================================================================  Please follow these instructions carefully:  BENZOYL PEROXIDE 5% GEL  Please do not use if you have an allergy to benzoyl peroxide. If your skin becomes reddened/irritated stop using the benzoyl peroxide.  Starting two days before surgery, apply as follows:  1. Apply benzoyl peroxide in the morning and at night. Apply after taking a shower. If you are not taking a shower clean entire shoulder front, back, and side along with the armpit with a clean wet washcloth.  2. Place a quarter-sized dollop on your SHOULDER and rub in thoroughly, making sure to cover the front, back,  and side of your shoulder, along with the armpit.   2 Days prior to Surgery First Dose on Sunday Morning 01/31/2020 Second Dose on Sunday Night 01/31/2020  Day Before Surgery First Dose on Monday Morning 02/01/2020 Night before surgery wash (entire body except face and private areas) with CHG Soap THEN Second Dose on Monday Night 02/01/2020  Morning of Surgery (Tuesday Morning 02/02/2020) wash BODY AGAIN with CHG Soap   4. Do NOT apply benzoyl peroxide gel on the day of surgery  [facility]- Preparing For Surgery  Before surgery, you can play an important role. Because skin is not sterile, your skin needs to be as free of germs as possible. You can reduce the number of germs on your skin by washing with CHG (chlorahexidine gluconate) Soap before surgery.  CHG is an antiseptic cleaner which kills germs and bonds with the skin to continue killing germs even after washing.    Oral Hygiene is also important to reduce your risk of infection.  Remember - BRUSH YOUR TEETH THE MORNING OF SURGERY WITH YOUR REGULAR TOOTHPASTE  Please do not use if you have an allergy to CHG or antibacterial soaps. If your skin becomes reddened/irritated stop using the CHG.  Do not shave (including legs and underarms) for at least 48 hours prior to first CHG shower. It is OK to shave your face.  Please follow these instructions carefully.   1. Shower the NIGHT BEFORE SURGERY and the MORNING OF SURGERY with CHG Soap.   2. If you chose to wash your hair, wash your hair first as usual with your normal shampoo.  3. After you shampoo, rinse your hair and body thoroughly to remove the shampoo.  4. Use CHG as you would any other liquid soap. You can apply CHG directly to the skin and wash gently with a scrungie or a clean washcloth.   5. Apply the CHG Soap to your body ONLY FROM THE NECK DOWN.  Do not use on open wounds or open sores. Avoid contact with your eyes, ears, mouth and genitals (private parts). Wash Face and  genitals (private parts)  with your normal soap.   6. Wash thoroughly, paying special attention to the area where your surgery will be performed.  7. Thoroughly rinse your body with warm water from the neck down.  8. DO NOT shower/wash with your normal soap after using and rinsing off the CHG Soap.  9. Pat yourself dry with a CLEAN TOWEL.  10. Wear CLEAN PAJAMAS to bed the night before surgery  11. Place CLEAN SHEETS on your bed the night of your first shower and DO NOT SLEEP WITH PETS.   Day of Surgery: Shower with CHG Soap as directed Wear Clean/Comfortable clothing the morning of surgery Do not apply any deodorants/lotions.   Remember to brush your teeth WITH YOUR REGULAR TOOTHPASTE.   Please read over the following fact sheets  that you were given.

## 2020-01-28 ENCOUNTER — Encounter (HOSPITAL_COMMUNITY): Payer: Self-pay

## 2020-01-28 ENCOUNTER — Other Ambulatory Visit (HOSPITAL_COMMUNITY)
Admission: RE | Admit: 2020-01-28 | Discharge: 2020-01-28 | Disposition: A | Discharge status: Home / Self Care | Payer: Medicare Other | Source: Ambulatory Visit | Attending: Orthopedic Surgery | Admitting: Orthopedic Surgery

## 2020-01-28 ENCOUNTER — Other Ambulatory Visit: Payer: Self-pay

## 2020-01-28 ENCOUNTER — Encounter (HOSPITAL_COMMUNITY)
Admission: RE | Admit: 2020-01-28 | Discharge: 2020-01-28 | Disposition: A | Discharge status: Home / Self Care | Payer: Medicare Other | Source: Ambulatory Visit | Attending: Orthopedic Surgery | Admitting: Orthopedic Surgery

## 2020-01-28 ENCOUNTER — Telehealth: Payer: Self-pay | Admitting: Orthopedic Surgery

## 2020-01-28 DIAGNOSIS — Z8616 Personal history of COVID-19: Secondary | ICD-10-CM | POA: Diagnosis not present

## 2020-01-28 DIAGNOSIS — E0500 Thyrotoxicosis with diffuse goiter without thyrotoxic crisis or storm: Secondary | ICD-10-CM | POA: Insufficient documentation

## 2020-01-28 DIAGNOSIS — R5382 Chronic fatigue, unspecified: Secondary | ICD-10-CM | POA: Insufficient documentation

## 2020-01-28 DIAGNOSIS — Z79890 Hormone replacement therapy (postmenopausal): Secondary | ICD-10-CM | POA: Insufficient documentation

## 2020-01-28 DIAGNOSIS — R911 Solitary pulmonary nodule: Secondary | ICD-10-CM | POA: Insufficient documentation

## 2020-01-28 DIAGNOSIS — Z87891 Personal history of nicotine dependence: Secondary | ICD-10-CM | POA: Diagnosis not present

## 2020-01-28 DIAGNOSIS — M12812 Other specific arthropathies, not elsewhere classified, left shoulder: Secondary | ICD-10-CM | POA: Insufficient documentation

## 2020-01-28 DIAGNOSIS — J439 Emphysema, unspecified: Secondary | ICD-10-CM | POA: Insufficient documentation

## 2020-01-28 DIAGNOSIS — K449 Diaphragmatic hernia without obstruction or gangrene: Secondary | ICD-10-CM | POA: Insufficient documentation

## 2020-01-28 DIAGNOSIS — Z20822 Contact with and (suspected) exposure to covid-19: Secondary | ICD-10-CM | POA: Insufficient documentation

## 2020-01-28 DIAGNOSIS — E785 Hyperlipidemia, unspecified: Secondary | ICD-10-CM | POA: Insufficient documentation

## 2020-01-28 DIAGNOSIS — Z01812 Encounter for preprocedural laboratory examination: Secondary | ICD-10-CM | POA: Diagnosis present

## 2020-01-28 DIAGNOSIS — K219 Gastro-esophageal reflux disease without esophagitis: Secondary | ICD-10-CM | POA: Insufficient documentation

## 2020-01-28 DIAGNOSIS — M75102 Unspecified rotator cuff tear or rupture of left shoulder, not specified as traumatic: Secondary | ICD-10-CM | POA: Insufficient documentation

## 2020-01-28 DIAGNOSIS — M797 Fibromyalgia: Secondary | ICD-10-CM | POA: Insufficient documentation

## 2020-01-28 DIAGNOSIS — E039 Hypothyroidism, unspecified: Secondary | ICD-10-CM | POA: Insufficient documentation

## 2020-01-28 DIAGNOSIS — Z87448 Personal history of other diseases of urinary system: Secondary | ICD-10-CM | POA: Insufficient documentation

## 2020-01-28 DIAGNOSIS — G43909 Migraine, unspecified, not intractable, without status migrainosus: Secondary | ICD-10-CM | POA: Insufficient documentation

## 2020-01-28 DIAGNOSIS — J45909 Unspecified asthma, uncomplicated: Secondary | ICD-10-CM | POA: Insufficient documentation

## 2020-01-28 DIAGNOSIS — Z96652 Presence of left artificial knee joint: Secondary | ICD-10-CM | POA: Insufficient documentation

## 2020-01-28 DIAGNOSIS — D649 Anemia, unspecified: Secondary | ICD-10-CM | POA: Insufficient documentation

## 2020-01-28 DIAGNOSIS — Z96641 Presence of right artificial hip joint: Secondary | ICD-10-CM | POA: Insufficient documentation

## 2020-01-28 DIAGNOSIS — Z79899 Other long term (current) drug therapy: Secondary | ICD-10-CM | POA: Insufficient documentation

## 2020-01-28 DIAGNOSIS — Z01818 Encounter for other preprocedural examination: Secondary | ICD-10-CM | POA: Insufficient documentation

## 2020-01-28 LAB — URINALYSIS, ROUTINE W REFLEX MICROSCOPIC
Bilirubin Urine: NEGATIVE
Glucose, UA: NEGATIVE mg/dL
Hgb urine dipstick: NEGATIVE
Ketones, ur: NEGATIVE mg/dL
Leukocytes,Ua: NEGATIVE
Nitrite: NEGATIVE
Protein, ur: NEGATIVE mg/dL
Specific Gravity, Urine: 1.02 (ref 1.005–1.030)
pH: 5 (ref 5.0–8.0)

## 2020-01-28 LAB — CBC
HCT: 36 % (ref 36.0–46.0)
Hemoglobin: 11.4 g/dL — ABNORMAL LOW (ref 12.0–15.0)
MCH: 31.1 pg (ref 26.0–34.0)
MCHC: 31.7 g/dL (ref 30.0–36.0)
MCV: 98.1 fL (ref 80.0–100.0)
Platelets: 199 10*3/uL (ref 150–400)
RBC: 3.67 MIL/uL — ABNORMAL LOW (ref 3.87–5.11)
RDW: 13.7 % (ref 11.5–15.5)
WBC: 4.9 10*3/uL (ref 4.0–10.5)
nRBC: 0 % (ref 0.0–0.2)

## 2020-01-28 LAB — SARS CORONAVIRUS 2 (TAT 6-24 HRS): SARS Coronavirus 2: NEGATIVE

## 2020-01-28 LAB — SURGICAL PCR SCREEN
MRSA, PCR: NEGATIVE
Staphylococcus aureus: POSITIVE — AB

## 2020-01-28 LAB — BASIC METABOLIC PANEL
Anion gap: 8 (ref 5–15)
BUN: 22 mg/dL (ref 8–23)
CO2: 27 mmol/L (ref 22–32)
Calcium: 9.3 mg/dL (ref 8.9–10.3)
Chloride: 102 mmol/L (ref 98–111)
Creatinine, Ser: 1.1 mg/dL — ABNORMAL HIGH (ref 0.44–1.00)
GFR, Estimated: 54 mL/min — ABNORMAL LOW (ref >60)
Glucose, Bld: 97 mg/dL (ref 70–99)
Potassium: 4.2 mmol/L (ref 3.5–5.1)
Sodium: 137 mmol/L (ref 135–145)

## 2020-01-28 NOTE — Telephone Encounter (Signed)
Patient called stating she need a call back. Patient states she has an upcoming surgery this Tuesday and need a call from Lauren F before then. Patient has questions. Please call patient at [phone]. Patient asked for call asap.

## 2020-01-28 NOTE — Progress Notes (Signed)
[facility] - [place], [place] - [phone] [place] [address] Phone: [phone] Fax: [phone]  [facility] - [place], [place] - [address] [facility] [place]. [address] [place] [employer]. [address] [place] [address] Phone: [phone] Fax: [phone]      Your procedure is scheduled on Tuesday 02/02/2020.  Report to [facility] Main Entrance "A" at 08:45 A.M., and check in at the Admitting office.  Call this number if you have problems the morning of surgery:  [phone]  Call [phone] if you have any questions prior to your surgery date Monday-Friday 8am-4pm    Remember:  Do not eat after midnight the night before your surgery  You may drink clear liquids until 0745am the morning of your surgery.   Clear liquids allowed are: Water, Non-Citrus Juices (without pulp), Carbonated Beverages, Clear Tea, Black Coffee Only, and Gatorade  Patient Instructions  . The night before surgery:  o No food after midnight. ONLY clear liquids after midnight  . The day of surgery (if you do NOT have diabetes):  o Drink ONE (1) Pre-Surgery Clear Ensure by 7:45am. Drink in one sitting. Do not sip.  o This drink was given to you during your hospital  pre-op appointment visit. o Nothing else to drink after completing the  Pre-Surgery Clear Ensure.         If you have questions, please contact your surgeon's office.     Take these medicines the morning of surgery with A SIP OF WATER: Bupropion (Wellbutrin XL) Buspirone (Buspar) Famotidine (Pepcid) Mirabegron ER (Myrbetriq) Sertraline (Zoloft)  If NEEDED you may take the following medications the morning of surgery: Acetaminophen (Tylenol) Albuterol (Ventolin) inhaler - Please bring all inhalers with you the day of surgery.  Fluticasone (Flonase) nasal spray Hydrocodone-acetaminophen (Norco) Duoneb nebulizer Eye drops Lorazepam (Ativan) Ondansetron  (Zofran) Tizanidine (Zanaflex)   As of today, STOP taking any Aspirin (unless otherwise instructed by your surgeon) Aleve, Naproxen, Ibuprofen, Motrin, Advil, Goody's, BC's, all herbal medications, fish oil, and all vitamins.                      Do not wear jewelry, make up, or nail polish            Do not wear lotions, powders, perfumes, or deodorant.            Do not shave 48 hours prior to surgery.  Men may shave face and neck.            Do not bring valuables to the hospital.            [facility] is not responsible for any belongings or valuables.  Do NOT Smoke (Tobacco/Vaping) or drink Alcohol 24 hours prior to your procedure  If you use a CPAP at night, you may bring all equipment for your overnight stay.   Contacts, glasses, dentures or bridgework may not be worn into surgery.      For patients admitted to the hospital, discharge time will be determined by your treatment team.   Patients discharged the day of surgery will not be allowed to drive home, and someone needs to stay with them for 24 hours.    Special instructions:    - Preparing for Total Shoulder Arthroplasty  Before surgery, you can play an important role. Because skin is not sterile, your skin needs to be as free of germs as possible. You can reduce the  number of germs on your skin by using the following products.   Benzoyl Peroxide Gel  o Reduces the number of germs present on the skin  o Applied twice a day to shoulder area starting two days before surgery   Chlorhexidine Gluconate (CHG) Soap (instructions listed above on how to wash with CHG Soap)  o An antiseptic cleaner that kills germs and bonds with the skin to continue killing germs even after washing  o Used for showering the night before surgery and morning of surgery   ==================================================================  Please follow these instructions carefully:  BENZOYL PEROXIDE 5% GEL  Please do  not use if you have an allergy to benzoyl peroxide. If your skin becomes reddened/irritated stop using the benzoyl peroxide.  Starting two days before surgery, apply as follows:  1. Apply benzoyl peroxide in the morning and at night. Apply after taking a shower. If you are not taking a shower clean entire shoulder front, back, and side along with the armpit with a clean wet washcloth.  2. Place a quarter-sized dollop on your SHOULDER and rub in thoroughly, making sure to cover the front, back, and side of your shoulder, along with the armpit.   2 Days prior to Surgery First Dose on Sunday Morning 01/31/2020 Second Dose on Sunday Night 01/31/2020  Day Before Surgery First Dose on Monday Morning 02/01/2020 Night before surgery wash (entire body except face and private areas) with CHG Soap THEN Second Dose on Monday Night 02/01/2020  Morning of Surgery (Tuesday Morning 02/02/2020) wash BODY AGAIN with CHG Soap   4. Do NOT apply benzoyl peroxide gel on the day of surgery  - Preparing For Surgery  Before surgery, you can play an important role. Because skin is not sterile, your skin needs to be as free of germs as possible. You can reduce the number of germs on your skin by washing with CHG (chlorahexidine gluconate) Soap before surgery.  CHG is an antiseptic cleaner which kills germs and bonds with the skin to continue killing germs even after washing.    Oral Hygiene is also important to reduce your risk of infection.  Remember - BRUSH YOUR TEETH THE MORNING OF SURGERY WITH YOUR REGULAR TOOTHPASTE  Please do not use if you have an allergy to CHG or antibacterial soaps. If your skin becomes reddened/irritated stop using the CHG.  Do not shave (including legs and underarms) for at least 48 hours prior to first CHG shower. It is OK to shave your face.  Please follow these instructions carefully.   1. Shower the NIGHT BEFORE SURGERY and the MORNING OF SURGERY with CHG Soap.   2. If  you chose to wash your hair, wash your hair first as usual with your normal shampoo.  3. After you shampoo, rinse your hair and body thoroughly to remove the shampoo.  4. Use CHG as you would any other liquid soap. You can apply CHG directly to the skin and wash gently with a scrungie or a clean washcloth.   5. Apply the CHG Soap to your body ONLY FROM THE NECK DOWN.  Do not use on open wounds or open sores. Avoid contact with your eyes, ears, mouth and genitals (private parts). Wash Face and genitals (private parts)  with your normal soap.   6. Wash thoroughly, paying special attention to the area where your surgery will be performed.  7. Thoroughly rinse your body with warm water from the neck down.  8. DO NOT shower/wash with  your normal soap after using and rinsing off the CHG Soap.  9. Pat yourself dry with a CLEAN TOWEL.  10. Wear CLEAN PAJAMAS to bed the night before surgery  11. Place CLEAN SHEETS on your bed the night of your first shower and DO NOT SLEEP WITH PETS.   Day of Surgery: Shower with CHG Soap as directed Wear Clean/Comfortable clothing the morning of surgery Do not apply any deodorants/lotions.   Remember to brush your teeth WITH YOUR REGULAR TOOTHPASTE.   Please read over the following fact sheets that you were given.

## 2020-01-28 NOTE — Progress Notes (Addendum)
PCP - Occupational hygienist - denies Pulmonologist: Dr. Jetty Duhamel  PPM/ICD - denies  Chest x-ray -n/a  EKG - n/a Stress Test - over 10 years ago ECHO - 2009 Cardiac Cath - denies  Sleep Study - denies  Patient instructed to hold all Aspirin, NSAID's, herbal medications, fish oil and vitamins 7 days prior to surgery.   ERAS Protcol -yes, water and instructions given  COVID TEST- 01/28/2020   Anesthesia review: yes., elevated BP during PAT appointment. Lori Orozco. PA-C notified during appt and ordered an EKG. No history of hypertension and not taking antihypertensive medication.   Patient denies shortness of breath, fever, cough and chest pain at PAT appointment   All instructions explained to the patient, with a verbal understanding of the material. Patient agrees to go over the instructions while at home for a better understanding. Patient also instructed to self quarantine after being tested for COVID-19. The opportunity to ask questions was provided.

## 2020-01-29 LAB — URINE CULTURE: Culture: NO GROWTH

## 2020-01-29 NOTE — Telephone Encounter (Signed)
I spoke with patient.  She had questions about using restroom after surgery as well as visitor policy for hospital stay

## 2020-02-01 ENCOUNTER — Telehealth: Payer: Self-pay | Admitting: Orthopedic Surgery

## 2020-02-01 NOTE — Anesthesia Preprocedure Evaluation (Addendum)
Anesthesia Evaluation  Patient identified by MRN, date of birth, ID band Patient awake    Reviewed: Allergy & Precautions, NPO status , Patient's Chart, lab work & pertinent test results  History of Anesthesia Complications (+) PONV  Airway Mallampati: II  TM Distance: >3 FB Neck ROM: Full    Dental  (+) Dental Advisory Given, Chipped   Pulmonary asthma , COPD,  COPD inhaler, former smoker,  01/28/2020 SARS coronavirus NEG   breath sounds clear to auscultation       Cardiovascular (-) anginanegative cardio ROS   Rhythm:Regular Rate:Normal  Echo 05/23/07: SUMMARY  - Overall left ventricular systolic function was vigorous. Left     ventricular ejection fraction was estimated , range being 70     % to 75 %. There were no left ventricular regional wall     motion abnormalities.  - Porbable patent foramen ovale. There was redundancy of the     interatrial septum, with borderline criteria for atrial     septal aneurysm.     Neuro/Psych  Headaches, Anxiety Depression Bipolar Disorder    GI/Hepatic Neg liver ROS, hiatal hernia, GERD  Medicated and Controlled,  Endo/Other  Hypothyroidism   Renal/GU Renal InsufficiencyRenal disease (creat 1.10)     Musculoskeletal  (+) Arthritis , Fibromyalgia -, narcotic dependent  Abdominal   Peds  Hematology negative hematology ROS (+)   Anesthesia Other Findings   Reproductive/Obstetrics                          Anesthesia Physical Anesthesia Plan  ASA: III  Anesthesia Plan: General   Post-op Pain Management: GA combined w/ Regional for post-op pain   Induction: Intravenous  PONV Risk Score and Plan: 4 or greater and Dexamethasone, Ondansetron and Treatment may vary due to age or medical condition  Airway Management Planned: Oral ETT  Additional Equipment: None  Intra-op Plan:   Post-operative Plan: Extubation in  OR  Informed Consent: I have reviewed the patients History and Physical, chart, labs and discussed the procedure including the risks, benefits and alternatives for the proposed anesthesia with the patient or authorized representative who has indicated his/her understanding and acceptance.     Dental advisory given  Plan Discussed with: CRNA and Surgeon  Anesthesia Plan Comments: (PAT note written 02/01/2020 by Shonna Chock, PA-C. Plan routine monitors, GETA with interscalene block for post op analgesia)      Anesthesia Quick Evaluation

## 2020-02-01 NOTE — Progress Notes (Addendum)
Anesthesia Chart Review:  Case: [id number] Date/Time: 02/02/20 1030   Procedure: LEFT REVERSE SHOULDER ARTHROPLASTY (Left Shoulder)   Anesthesia type: General   Pre-op diagnosis: left shoulder rotator cuff arthropathy   [facility] / [facility]   Surgeons: [name], MD      DISCUSSION: Patient is a 69 year old female scheduled for the above procedure.   History includes former smoker (quit 02/26/13), post-operative N/V, COPD/emphysema, asthma, exertional dyspnea (2016), HLD, chronic fatigue syndrome, GERD, hiatal hernia, migraines, Graves disease, hypothyroidism, fibromyalgia, nephritis (age 28), anemia, diverticulitis, left eye amaurosis fugax/scotoma (04/2007 with no significant carotid disease and negative head CT), COVID-19 (03/16/19, s/p monoclonal antibodies 03/21/19), TMJ surgery. Small right lung nodule (stable on 12/15/19 CT, felt consistent with benign etiology). Probable PFO on 2009 echo.   Patient without reported HTN history but BP elevated at PAT 167/80, so EKG added to PAT testing. Results where stable since 2015 showing SR, septal infarct. Previous BP on 12/17/19 was 122/72. Message left for Debbie at Dr. Diamantina [place] office regarding elevated BP at PAT. She will get vitals on arrival day of surgery.   Notes indicate she has received Moderna COVID-19 vaccine x2. Presurgical COVID-19 test negative on 02/02/20.    VS: BP (!) 167/80   Pulse 78   Temp 36.6 C (Oral)   Resp 20   Ht  (1.702 m)   Wt 90.8 kg   SpO2 94%   BMI 31.36 kg/m   BP Readings from Last 3 Encounters:  01/28/20 (!) 167/80  12/17/19 122/72  08/24/19 (!) 142/86    PROVIDERS: [name], MD is PCP  Jetty Duhamel, MD is pulmonologist. Last visit 12/17/19. No evident COVID-19 residual symptoms. COPD stable. He notes plans to follow right lung nodules over time as needed (felt likely benign on 12/15/19 CT).    LABS: Labs reviewed: Acceptable for surgery. (all labs ordered are listed, but  only abnormal results are displayed)  Labs Reviewed  SURGICAL PCR SCREEN - Abnormal; Notable for the following components:      Result Value   Staphylococcus aureus POSITIVE (*)    All other components within normal limits  BASIC METABOLIC PANEL - Abnormal; Notable for the following components:   Creatinine, Ser 1.10 (*)    GFR, Estimated 54 (*)    All other components within normal limits  CBC - Abnormal; Notable for the following components:   RBC 3.67 (*)    Hemoglobin 11.4 (*)    All other components within normal limits  URINE CULTURE  URINALYSIS, ROUTINE W REFLEX MICROSCOPIC     IMAGES: CT Left shoulder 01/14/20: IMPRESSION: 1. Mild glenohumeral joint degenerative changes. 2. The humeral head is riding high in the glenoid fossa and the humeroacromial space is narrowed. This is due to a chronic supraspinatus tendon tear. There is associated fairly marked fatty atrophy of the supraspinatus muscle 3. The infraspinatus and subscapularis tendons appear to be grossly intact by CT. 4. Emphysematous changes are noted and there are aortic and coronary artery calcifications. 5. Emphysema and aortic atherosclerosis.  CT Chest 12/15/19: IMPRESSION: 1. Stable small RIGHT lower lobe pulmonary nodules over 1 year time consistent benign etiology per Fleischner criteria. 2. Fine micro nodularity in the medial middle lobes consistent with chronic inflammation/infection. No interval change.  CTA head/neck 06/26/07: IMPRESSION:  Negative CT angiography of the extracranial circulation; there does  not appear to be significant atherosclerotic disease responsible  for visual scotomas.  EKG: 01/28/20: Normal sinus rhythm Possible Left atrial enlargement Septal infarct , age undetermined Abnormal ECG No significant change since last tracing Confirmed by Tonny Bollman [phone]) on 01/30/2020 8:13:33 AM   CV: Echo 05/23/07: SUMMARY  - Overall left ventricular systolic  function was vigorous. Left     ventricular ejection fraction was estimated , range being 70     % to 75 %. There were no left ventricular regional wall     motion abnormalities.  - Probable patent foramen ovale. There was redundancy of the     interatrial septum, with borderline criteria for atrial     septal aneurysm.    Carotid US 05/08/07: IMPRESSION:  1. 1-39% stenosis noted in bilateral internal carotid arteries.  2. Antegrade bilateral vertebral arteries.  Reported stress test > 10 years ago.   Past Medical History:  Diagnosis Date  . Amaurosis fugax   . Anemia    hx  . Anxiety   . Arthritis   . Asthma   . Cataract   . Chronic fatigue syndrome   . Chronic kidney disease    frequency, nephritis when 69 yrs old  . COPD (chronic obstructive pulmonary disease) (HCC)   . COVID-19   . Depression   . Diverticulitis   . Emphysema of lung (HCC)   . Fibromyalgia   . GERD (gastroesophageal reflux disease)    occ  . H/O hiatal hernia   . History of bronchitis   . Hyperlipidemia   . Hypothyroidism   . Interstitial cystitis   . Irritable bowel syndrome   . Lichen sclerosus   . Lumbar herniated disc   . Migraines   . Pneumonia   . PONV (postoperative nausea and vomiting)   . Shortness of breath    exertion   . Thyroid disease    Graves  . Urinary frequency   . Urinary tract infection   . Vertigo     Past Surgical History:  Procedure Laterality Date  . ABDOMINAL HYSTERECTOMY    . bladder sugery     . CAROTID DOPPLAR    . COLONOSCOPY  05/26/2010   avms- otherwise nl , re check 10y  . DEXA-OSTEOPENIA    . DOPPLER ECHOCARDIOGRAPHY    . elbow surgery    . EPICONDYLITIS    . EYE SURGERY Bilateral    cataracts  . FOOT SURGERY Bilateral   . KNEE SURGERY     Left cartilage  . lipoma in second finger right hand    . PLANTAR FASCIA SURGERY Left   . ROTATOR CUFF REPAIR Left 12/2017  . TEMPOROMANDIBULAR JOINT SURGERY    . TONSILLECTOMY    .  TOTAL HIP ARTHROPLASTY Right 03/20/2013   Procedure: Right TOTAL HIP ARTHROPLASTY;  Surgeon: [name], MD;  Location: [facility];  Service: Orthopedics;  Laterality: Right;  Right Total Hip Arthroplasty  . TOTAL KNEE ARTHROPLASTY Left 01/28/2015   Procedure: LEFT TOTAL KNEE ARTHROPLASTY;  Surgeon: [name], MD;  Location: [facility];  Service: Orthopedics;  Laterality: Left;  . TRIGGER FINGER RELEASE Right   . TROCHANTERIC BURSA EXCISION Right 05/03/2016   Dr. [name], [name]  . WRIST ARTHROSCOPY Right    ligament tear    MEDICATIONS: . acetaminophen (TYLENOL) 500 MG tablet  . albuterol (VENTOLIN HFA) 108 (90 Base) MCG/ACT inhaler  . buPROPion (WELLBUTRIN XL) 150 MG 24 hr tablet  . busPIRone (BUSPAR) 15 MG tablet  . Cholecalciferol (VITAMIN D3) 2000 UNITS TABS  . clobetasol cream (  TEMOVATE) 0.05 %  . cyanocobalamin 2000 MCG tablet  . diphenhydrAMINE (BENADRYL) 25 MG tablet  . divalproex (DEPAKOTE ER) 500 MG 24 hr tablet  . famotidine (PEPCID) 20 MG tablet  . fluticasone (FLONASE) 50 MCG/ACT nasal spray  . HYDROcodone-acetaminophen (NORCO) 7.5-325 MG tablet  . ibuprofen (ADVIL) 200 MG tablet  . ipratropium-albuterol (DUONEB) 0.5-2.5 (3) MG/3ML SOLN  . ketotifen (ZADITOR) 0.025 % ophthalmic solution  . levothyroxine (SYNTHROID) 50 MCG tablet  . LORazepam (ATIVAN) 1 MG tablet  . mirabegron ER (MYRBETRIQ) 25 MG TB24 tablet  . mirtazapine (REMERON) 30 MG tablet  . ondansetron (ZOFRAN ODT) 4 MG disintegrating tablet  . PREMARIN 0.625 MG tablet  . sertraline (ZOLOFT) 100 MG tablet  . simvastatin (ZOCOR) 20 MG tablet  . tiZANidine (ZANAFLEX) 2 MG tablet   No current facility-administered medications for this encounter.    Shonna Chock, PA-C Surgical Short Stay/Anesthesiology [facility] Phone [phone] [facility] Phone [phone] 02/01/2020 9:48 AM

## 2020-02-01 NOTE — Telephone Encounter (Signed)
FYI:   Patient's blood pressure was elevated at PAT 167/80, so EKG added to PAT testing. Previous BP on 12/17/19 was 122/72.  Vitals will be done again on day of surgery.

## 2020-02-02 ENCOUNTER — Observation Stay (HOSPITAL_COMMUNITY)
Admission: RE | Admit: 2020-02-02 | Discharge: 2020-02-03 | Disposition: A | Discharge status: Home / Self Care | Payer: Medicare Other | Source: Ambulatory Visit | Attending: Orthopedic Surgery | Admitting: Orthopedic Surgery

## 2020-02-02 ENCOUNTER — Other Ambulatory Visit: Payer: Self-pay

## 2020-02-02 ENCOUNTER — Encounter (HOSPITAL_COMMUNITY)
Admission: RE | Disposition: A | Discharge status: Home / Self Care | Payer: Self-pay | Source: Ambulatory Visit | Attending: Orthopedic Surgery

## 2020-02-02 ENCOUNTER — Ambulatory Visit (HOSPITAL_COMMUNITY): Payer: Medicare Other | Admitting: Vascular Surgery

## 2020-02-02 ENCOUNTER — Ambulatory Visit (HOSPITAL_COMMUNITY): Payer: Medicare Other | Admitting: Anesthesiology

## 2020-02-02 ENCOUNTER — Ambulatory Visit (HOSPITAL_COMMUNITY): Payer: Medicare Other

## 2020-02-02 ENCOUNTER — Encounter (HOSPITAL_COMMUNITY): Payer: Self-pay | Admitting: Orthopedic Surgery

## 2020-02-02 DIAGNOSIS — M12811 Other specific arthropathies, not elsewhere classified, right shoulder: Secondary | ICD-10-CM | POA: Diagnosis not present

## 2020-02-02 DIAGNOSIS — J449 Chronic obstructive pulmonary disease, unspecified: Secondary | ICD-10-CM | POA: Insufficient documentation

## 2020-02-02 DIAGNOSIS — N189 Chronic kidney disease, unspecified: Secondary | ICD-10-CM | POA: Insufficient documentation

## 2020-02-02 DIAGNOSIS — Z9889 Other specified postprocedural states: Secondary | ICD-10-CM

## 2020-02-02 DIAGNOSIS — Z87891 Personal history of nicotine dependence: Secondary | ICD-10-CM | POA: Insufficient documentation

## 2020-02-02 DIAGNOSIS — S46002A Unspecified injury of muscle(s) and tendon(s) of the rotator cuff of left shoulder, initial encounter: Secondary | ICD-10-CM | POA: Diagnosis not present

## 2020-02-02 DIAGNOSIS — Z96652 Presence of left artificial knee joint: Secondary | ICD-10-CM | POA: Insufficient documentation

## 2020-02-02 DIAGNOSIS — Z7951 Long term (current) use of inhaled steroids: Secondary | ICD-10-CM | POA: Insufficient documentation

## 2020-02-02 DIAGNOSIS — Z8616 Personal history of COVID-19: Secondary | ICD-10-CM | POA: Diagnosis not present

## 2020-02-02 DIAGNOSIS — J45909 Unspecified asthma, uncomplicated: Secondary | ICD-10-CM | POA: Insufficient documentation

## 2020-02-02 DIAGNOSIS — Z79899 Other long term (current) drug therapy: Secondary | ICD-10-CM | POA: Insufficient documentation

## 2020-02-02 DIAGNOSIS — M19012 Primary osteoarthritis, left shoulder: Principal | ICD-10-CM | POA: Insufficient documentation

## 2020-02-02 DIAGNOSIS — Z96641 Presence of right artificial hip joint: Secondary | ICD-10-CM | POA: Diagnosis not present

## 2020-02-02 DIAGNOSIS — M19011 Primary osteoarthritis, right shoulder: Secondary | ICD-10-CM

## 2020-02-02 DIAGNOSIS — X58XXXA Exposure to other specified factors, initial encounter: Secondary | ICD-10-CM | POA: Insufficient documentation

## 2020-02-02 DIAGNOSIS — E039 Hypothyroidism, unspecified: Secondary | ICD-10-CM | POA: Diagnosis not present

## 2020-02-02 DIAGNOSIS — Z96612 Presence of left artificial shoulder joint: Secondary | ICD-10-CM

## 2020-02-02 DIAGNOSIS — M25512 Pain in left shoulder: Secondary | ICD-10-CM | POA: Diagnosis present

## 2020-02-02 HISTORY — PX: REVERSE SHOULDER ARTHROPLASTY: SHX5054

## 2020-02-02 SURGERY — ARTHROPLASTY, SHOULDER, TOTAL, REVERSE
Anesthesia: General | Site: Shoulder | Laterality: Left

## 2020-02-02 MED ORDER — VANCOMYCIN HCL 1000 MG IV SOLR
INTRAVENOUS | Status: DC | PRN
  Administered 2020-02-02: 1000 mg via TOPICAL

## 2020-02-02 MED ORDER — DIPHENHYDRAMINE HCL 25 MG PO CAPS: 25.0000 mg | ORAL_CAPSULE | Freq: Four times a day (QID) | ORAL | Status: DC | PRN

## 2020-02-02 MED ORDER — MIDAZOLAM HCL 2 MG/2ML IJ SOLN: 1.0000 mg | Freq: Once | INTRAMUSCULAR | Status: DC

## 2020-02-02 MED ORDER — ALBUTEROL SULFATE HFA 108 (90 BASE) MCG/ACT IN AERS: 2.0000 | INHALATION_SPRAY | Freq: Four times a day (QID) | RESPIRATORY_TRACT | Status: DC | PRN

## 2020-02-02 MED ORDER — TRANEXAMIC ACID-NACL 1000-0.7 MG/100ML-% IV SOLN
1000.0000 mg | INTRAVENOUS | Status: AC
  Administered 2020-02-02: 1000 mg via INTRAVENOUS
  Filled 2020-02-02: qty 100

## 2020-02-02 MED ORDER — MEPERIDINE HCL 25 MG/ML IJ SOLN: 6.2500 mg | INTRAMUSCULAR | Status: DC | PRN

## 2020-02-02 MED ORDER — OXYCODONE HCL 5 MG PO TABS: 5.0000 mg | ORAL_TABLET | ORAL | Status: DC | PRN

## 2020-02-02 MED ORDER — LIDOCAINE 2% (20 MG/ML) 5 ML SYRINGE
INTRAMUSCULAR | Status: DC | PRN
  Administered 2020-02-02: 40 mg via INTRAVENOUS
  Administered 2020-02-02: 30 mg via INTRAVENOUS

## 2020-02-02 MED ORDER — PROMETHAZINE HCL 25 MG/ML IJ SOLN: 6.2500 mg | INTRAMUSCULAR | Status: DC | PRN

## 2020-02-02 MED ORDER — ONDANSETRON HCL 4 MG PO TABS: 4.0000 mg | ORAL_TABLET | Freq: Four times a day (QID) | ORAL | Status: DC | PRN

## 2020-02-02 MED ORDER — MENTHOL 3 MG MT LOZG: 1.0000 | LOZENGE | OROMUCOSAL | Status: DC | PRN

## 2020-02-02 MED ORDER — LACTATED RINGERS IV SOLN: INTRAVENOUS | Status: DC

## 2020-02-02 MED ORDER — LACTATED RINGERS IV SOLN: INTRAVENOUS | Status: DC | PRN

## 2020-02-02 MED ORDER — CEFAZOLIN SODIUM-DEXTROSE 2-4 GM/100ML-% IV SOLN
2.0000 g | Freq: Three times a day (TID) | INTRAVENOUS | Status: AC
  Administered 2020-02-02 – 2020-02-03 (×2): 2 g via INTRAVENOUS
  Filled 2020-02-02 (×2): qty 100

## 2020-02-02 MED ORDER — ACETAMINOPHEN 325 MG PO TABS: 325.0000 mg | ORAL_TABLET | Freq: Four times a day (QID) | ORAL | Status: DC | PRN

## 2020-02-02 MED ORDER — BUPIVACAINE-EPINEPHRINE (PF) 0.5% -1:200000 IJ SOLN
INTRAMUSCULAR | Status: DC | PRN
  Administered 2020-02-02: 10 mL via PERINEURAL

## 2020-02-02 MED ORDER — FENTANYL CITRATE (PF) 250 MCG/5ML IJ SOLN
INTRAMUSCULAR | Status: DC | PRN
  Administered 2020-02-02 (×2): 50 ug via INTRAVENOUS

## 2020-02-02 MED ORDER — METHOCARBAMOL 1000 MG/10ML IJ SOLN
500.0000 mg | Freq: Four times a day (QID) | INTRAVENOUS | Status: DC | PRN
  Filled 2020-02-02: qty 5

## 2020-02-02 MED ORDER — VANCOMYCIN HCL IN DEXTROSE 1-5 GM/200ML-% IV SOLN
1000.0000 mg | INTRAVENOUS | Status: AC
  Administered 2020-02-02: 1000 mg via INTRAVENOUS

## 2020-02-02 MED ORDER — FENTANYL CITRATE (PF) 100 MCG/2ML IJ SOLN: 50.0000 ug | Freq: Once | INTRAMUSCULAR | Status: DC

## 2020-02-02 MED ORDER — IBUPROFEN 800 MG PO TABS
800.0000 mg | ORAL_TABLET | Freq: Three times a day (TID) | ORAL | Status: DC
  Administered 2020-02-02 – 2020-02-03 (×2): 800 mg via ORAL
  Filled 2020-02-02 (×2): qty 1

## 2020-02-02 MED ORDER — PROPOFOL 10 MG/ML IV BOLUS
INTRAVENOUS | Status: DC | PRN
  Administered 2020-02-02: 160 mg via INTRAVENOUS

## 2020-02-02 MED ORDER — DIVALPROEX SODIUM ER 500 MG PO TB24
500.0000 mg | ORAL_TABLET | Freq: Every day | ORAL | Status: DC
  Administered 2020-02-02: 500 mg via ORAL
  Filled 2020-02-02 (×2): qty 1

## 2020-02-02 MED ORDER — MIRABEGRON ER 25 MG PO TB24: 25.0000 mg | ORAL_TABLET | Freq: Every day | ORAL | Status: DC

## 2020-02-02 MED ORDER — VANCOMYCIN HCL IN DEXTROSE 1-5 GM/200ML-% IV SOLN
INTRAVENOUS | Status: AC
  Filled 2020-02-02: qty 200

## 2020-02-02 MED ORDER — PHENYLEPHRINE HCL-NACL 10-0.9 MG/250ML-% IV SOLN
INTRAVENOUS | Status: DC | PRN
  Administered 2020-02-02: 60 ug/min via INTRAVENOUS

## 2020-02-02 MED ORDER — POVIDONE-IODINE 10 % EX SWAB
2.0000 "application " | Freq: Once | CUTANEOUS | Status: AC
  Administered 2020-02-02: 2 via TOPICAL

## 2020-02-02 MED ORDER — ONDANSETRON HCL 4 MG/2ML IJ SOLN: 4.0000 mg | Freq: Four times a day (QID) | INTRAMUSCULAR | Status: DC | PRN

## 2020-02-02 MED ORDER — HYDROMORPHONE HCL 1 MG/ML IJ SOLN: 0.5000 mg | INTRAMUSCULAR | Status: DC | PRN

## 2020-02-02 MED ORDER — CEFAZOLIN SODIUM-DEXTROSE 2-3 GM-%(50ML) IV SOLR
INTRAVENOUS | Status: DC | PRN
  Administered 2020-02-02: 2 g via INTRAVENOUS

## 2020-02-02 MED ORDER — PHENOL 1.4 % MT LIQD: 1.0000 | OROMUCOSAL | Status: DC | PRN

## 2020-02-02 MED ORDER — BUPROPION HCL ER (XL) 150 MG PO TB24
150.0000 mg | ORAL_TABLET | Freq: Every day | ORAL | Status: DC
  Administered 2020-02-03: 150 mg via ORAL
  Filled 2020-02-02: qty 1

## 2020-02-02 MED ORDER — GLYCOPYRROLATE PF 0.2 MG/ML IJ SOSY
PREFILLED_SYRINGE | INTRAMUSCULAR | Status: DC | PRN
  Administered 2020-02-02: .2 mg via INTRAVENOUS

## 2020-02-02 MED ORDER — ACETAMINOPHEN 500 MG PO TABS
ORAL_TABLET | ORAL | Status: AC
  Filled 2020-02-02: qty 2

## 2020-02-02 MED ORDER — POVIDONE-IODINE 7.5 % EX SOLN: Freq: Once | CUTANEOUS | Status: AC

## 2020-02-02 MED ORDER — METOCLOPRAMIDE HCL 5 MG/ML IJ SOLN: 5.0000 mg | Freq: Three times a day (TID) | INTRAMUSCULAR | Status: DC | PRN

## 2020-02-02 MED ORDER — DEXAMETHASONE SODIUM PHOSPHATE 10 MG/ML IJ SOLN
INTRAMUSCULAR | Status: DC | PRN
  Administered 2020-02-02: 8 mg via INTRAVENOUS

## 2020-02-02 MED ORDER — IPRATROPIUM-ALBUTEROL 0.5-2.5 (3) MG/3ML IN SOLN: 3.0000 mL | Freq: Four times a day (QID) | RESPIRATORY_TRACT | Status: DC | PRN

## 2020-02-02 MED ORDER — ONDANSETRON HCL 4 MG/2ML IJ SOLN
INTRAMUSCULAR | Status: DC | PRN
  Administered 2020-02-02: 4 mg via INTRAVENOUS

## 2020-02-02 MED ORDER — MIDAZOLAM HCL 2 MG/2ML IJ SOLN
INTRAMUSCULAR | Status: AC
  Filled 2020-02-02: qty 2

## 2020-02-02 MED ORDER — LEVOTHYROXINE SODIUM 50 MCG PO TABS
50.0000 ug | ORAL_TABLET | Freq: Every day | ORAL | Status: DC
  Administered 2020-02-02: 50 ug via ORAL
  Filled 2020-02-02: qty 1
  Filled 2020-02-02: qty 2
  Filled 2020-02-02: qty 1

## 2020-02-02 MED ORDER — FENTANYL CITRATE (PF) 100 MCG/2ML IJ SOLN
INTRAMUSCULAR | Status: AC
  Filled 2020-02-02: qty 2

## 2020-02-02 MED ORDER — PROPOFOL 10 MG/ML IV BOLUS
INTRAVENOUS | Status: AC
  Filled 2020-02-02: qty 20

## 2020-02-02 MED ORDER — CHLORHEXIDINE GLUCONATE 0.12 % MT SOLN
OROMUCOSAL | Status: AC
  Administered 2020-02-02: 15 mL
  Filled 2020-02-02: qty 15

## 2020-02-02 MED ORDER — MIRABEGRON ER 25 MG PO TB24
25.0000 mg | ORAL_TABLET | Freq: Every day | ORAL | Status: DC
  Administered 2020-02-03: 25 mg via ORAL
  Filled 2020-02-02 (×2): qty 1

## 2020-02-02 MED ORDER — MIDAZOLAM HCL 2 MG/2ML IJ SOLN: 0.5000 mg | Freq: Once | INTRAMUSCULAR | Status: DC | PRN

## 2020-02-02 MED ORDER — HYDROMORPHONE HCL 1 MG/ML IJ SOLN: 0.2500 mg | INTRAMUSCULAR | Status: DC | PRN

## 2020-02-02 MED ORDER — SERTRALINE HCL 100 MG PO TABS
100.0000 mg | ORAL_TABLET | Freq: Every day | ORAL | Status: DC
  Administered 2020-02-03: 100 mg via ORAL
  Filled 2020-02-02: qty 1

## 2020-02-02 MED ORDER — ACETAMINOPHEN 500 MG PO TABS
1000.0000 mg | ORAL_TABLET | Freq: Four times a day (QID) | ORAL | Status: DC
  Administered 2020-02-02 – 2020-02-03 (×3): 1000 mg via ORAL
  Filled 2020-02-02 (×3): qty 2

## 2020-02-02 MED ORDER — ASPIRIN EC 81 MG PO TBEC
81.0000 mg | DELAYED_RELEASE_TABLET | Freq: Every day | ORAL | Status: DC
  Administered 2020-02-02 – 2020-02-03 (×2): 81 mg via ORAL
  Filled 2020-02-02 (×2): qty 1

## 2020-02-02 MED ORDER — ROCURONIUM BROMIDE 10 MG/ML (PF) SYRINGE
PREFILLED_SYRINGE | INTRAVENOUS | Status: AC
  Filled 2020-02-02: qty 10

## 2020-02-02 MED ORDER — FENTANYL CITRATE (PF) 250 MCG/5ML IJ SOLN
INTRAMUSCULAR | Status: AC
  Filled 2020-02-02: qty 5

## 2020-02-02 MED ORDER — PROPOFOL 500 MG/50ML IV EMUL
INTRAVENOUS | Status: DC | PRN
  Administered 2020-02-02: 25 ug/kg/min via INTRAVENOUS

## 2020-02-02 MED ORDER — 0.9 % SODIUM CHLORIDE (POUR BTL) OPTIME
TOPICAL | Status: DC | PRN
  Administered 2020-02-02: 1000 mL

## 2020-02-02 MED ORDER — OXYCODONE HCL 5 MG/5ML PO SOLN: 5.0000 mg | Freq: Once | ORAL | Status: DC | PRN

## 2020-02-02 MED ORDER — VANCOMYCIN HCL 1000 MG IV SOLR
INTRAVENOUS | Status: AC
  Filled 2020-02-02: qty 1000

## 2020-02-02 MED ORDER — ACETAMINOPHEN 500 MG PO TABS
1000.0000 mg | ORAL_TABLET | Freq: Once | ORAL | Status: AC
  Administered 2020-02-02: 1000 mg via ORAL

## 2020-02-02 MED ORDER — DOCUSATE SODIUM 100 MG PO CAPS
100.0000 mg | ORAL_CAPSULE | Freq: Two times a day (BID) | ORAL | Status: DC
  Administered 2020-02-02 – 2020-02-03 (×2): 100 mg via ORAL
  Filled 2020-02-02 (×2): qty 1

## 2020-02-02 MED ORDER — ONDANSETRON HCL 4 MG/2ML IJ SOLN
INTRAMUSCULAR | Status: AC
  Filled 2020-02-02: qty 2

## 2020-02-02 MED ORDER — BUSPIRONE HCL 15 MG PO TABS
15.0000 mg | ORAL_TABLET | Freq: Two times a day (BID) | ORAL | Status: DC
  Administered 2020-02-02 – 2020-02-03 (×2): 15 mg via ORAL
  Filled 2020-02-02 (×3): qty 1

## 2020-02-02 MED ORDER — METHOCARBAMOL 500 MG PO TABS: 500.0000 mg | ORAL_TABLET | Freq: Four times a day (QID) | ORAL | Status: DC | PRN

## 2020-02-02 MED ORDER — DEXAMETHASONE SODIUM PHOSPHATE 10 MG/ML IJ SOLN
INTRAMUSCULAR | Status: AC
  Filled 2020-02-02: qty 1

## 2020-02-02 MED ORDER — PHENYLEPHRINE 40 MCG/ML (10ML) SYRINGE FOR IV PUSH (FOR BLOOD PRESSURE SUPPORT)
PREFILLED_SYRINGE | INTRAVENOUS | Status: DC | PRN
  Administered 2020-02-02: 120 ug via INTRAVENOUS

## 2020-02-02 MED ORDER — SUCCINYLCHOLINE CHLORIDE 200 MG/10ML IV SOSY
PREFILLED_SYRINGE | INTRAVENOUS | Status: DC | PRN
  Administered 2020-02-02: 100 mg via INTRAVENOUS

## 2020-02-02 MED ORDER — OXYCODONE HCL 5 MG PO TABS: 5.0000 mg | ORAL_TABLET | Freq: Once | ORAL | Status: DC | PRN

## 2020-02-02 MED ORDER — CEFAZOLIN SODIUM-DEXTROSE 2-4 GM/100ML-% IV SOLN
INTRAVENOUS | Status: AC
  Filled 2020-02-02: qty 100

## 2020-02-02 MED ORDER — FAMOTIDINE 20 MG PO TABS
20.0000 mg | ORAL_TABLET | Freq: Two times a day (BID) | ORAL | Status: DC
  Administered 2020-02-03 (×2): 20 mg via ORAL
  Filled 2020-02-02 (×2): qty 1

## 2020-02-02 MED ORDER — METOCLOPRAMIDE HCL 5 MG PO TABS: 5.0000 mg | ORAL_TABLET | Freq: Three times a day (TID) | ORAL | Status: DC | PRN

## 2020-02-02 MED ORDER — BUPIVACAINE LIPOSOME 1.3 % IJ SUSP
INTRAMUSCULAR | Status: DC | PRN
  Administered 2020-02-02: 10 mL via PERINEURAL

## 2020-02-02 MED ORDER — IRRISEPT - 450ML BOTTLE WITH 0.05% CHG IN STERILE WATER, USP 99.95% OPTIME
TOPICAL | Status: DC | PRN
  Administered 2020-02-02: 450 mL via TOPICAL

## 2020-02-02 SURGICAL SUPPLY — 90 items
AID PSTN UNV HD RSTRNT DISP (MISCELLANEOUS) ×1
ALCOHOL 70% 16 OZ (MISCELLANEOUS) ×3 IMPLANT
APL PRP STRL LF DISP 70% ISPRP (MISCELLANEOUS) ×1
AUG COMP REV MI TAPER ADAPTER (Joint) ×3 IMPLANT
AUGMENT COMP REV MI TAPR ADPTR (Joint) IMPLANT
BIT DRILL 2.7 W/STOP DISP (BIT) ×1 IMPLANT
BIT DRILL 2.7MM W/STOP DISP (BIT) ×1
BIT DRILL QUICK REL 1/8 2PK SL (DRILL) IMPLANT
BIT DRILL TWIST 2.7 (BIT) ×1 IMPLANT
BIT DRILL TWIST 2.7MM (BIT) ×1
BLADE SAW SGTL 13X75X1.27 (BLADE) ×3 IMPLANT
BRNG HUM +3 36 RVRS SHLDR (Shoulder) ×1 IMPLANT
BSPLAT GLND SM AUG TPR ADPR (Joint) ×1 IMPLANT
CHLORAPREP W/TINT 26 (MISCELLANEOUS) ×3 IMPLANT
CLOSURE WOUND 1/2 X4 (GAUZE/BANDAGES/DRESSINGS) ×1
COOLER ICEMAN CLASSIC (MISCELLANEOUS) ×3 IMPLANT
COVER SURGICAL LIGHT HANDLE (MISCELLANEOUS) ×3 IMPLANT
COVER WAND RF STERILE (DRAPES) ×3 IMPLANT
DRAPE INCISE IOBAN 66X45 STRL (DRAPES) ×3 IMPLANT
DRAPE U-SHAPE 47X51 STRL (DRAPES) ×6 IMPLANT
DRILL QUICK RELEASE 1/8 INCH (DRILL) ×3
DRSG AQUACEL AG ADV 3.5X10 (GAUZE/BANDAGES/DRESSINGS) ×3 IMPLANT
ELECT BLADE 4.0 EZ CLEAN MEGAD (MISCELLANEOUS) ×3
ELECT REM PT RETURN 9FT ADLT (ELECTROSURGICAL) ×3
ELECTRODE BLDE 4.0 EZ CLN MEGD (MISCELLANEOUS) ×1 IMPLANT
ELECTRODE REM PT RTRN 9FT ADLT (ELECTROSURGICAL) ×1 IMPLANT
GAUZE SPONGE 4X4 12PLY STRL LF (GAUZE/BANDAGES/DRESSINGS) ×3 IMPLANT
GLENOID SPHERE STD STRL 36MM (Orthopedic Implant) ×2 IMPLANT
GLOVE BIOGEL PI IND STRL 7.0 (GLOVE) ×1 IMPLANT
GLOVE BIOGEL PI IND STRL 8 (GLOVE) ×1 IMPLANT
GLOVE BIOGEL PI INDICATOR 7.0 (GLOVE) ×2
GLOVE BIOGEL PI INDICATOR 8 (GLOVE) ×2
GLOVE ECLIPSE 7.0 STRL STRAW (GLOVE) ×3 IMPLANT
GLOVE ECLIPSE 8.0 STRL XLNG CF (GLOVE) ×3 IMPLANT
GOWN STRL REUS W/ TWL LRG LVL3 (GOWN DISPOSABLE) ×2 IMPLANT
GOWN STRL REUS W/ TWL XL LVL3 (GOWN DISPOSABLE) IMPLANT
GOWN STRL REUS W/TWL LRG LVL3 (GOWN DISPOSABLE) ×6
GOWN STRL REUS W/TWL XL LVL3 (GOWN DISPOSABLE)
GUIDE MODEL REV SHLD LT (ORTHOPEDIC DISPOSABLE SUPPLIES) ×2 IMPLANT
HYDROGEN PEROXIDE 16OZ (MISCELLANEOUS) ×3 IMPLANT
JET LAVAGE IRRISEPT WOUND (IRRIGATION / IRRIGATOR) ×3
KIT BASIN OR (CUSTOM PROCEDURE TRAY) ×3 IMPLANT
KIT TURNOVER KIT B (KITS) ×3 IMPLANT
LAVAGE JET IRRISEPT WOUND (IRRIGATION / IRRIGATOR) ×1 IMPLANT
LOOP VESSEL MAXI BLUE (MISCELLANEOUS) ×3 IMPLANT
MANIFOLD NEPTUNE II (INSTRUMENTS) ×3 IMPLANT
NDL SUT 6 .5 CRC .975X.05 MAYO (NEEDLE) IMPLANT
NDL TAPERED W/ NITINOL LOOP (MISCELLANEOUS) ×1 IMPLANT
NEEDLE MAYO TAPER (NEEDLE)
NEEDLE TAPERED W/ NITINOL LOOP (MISCELLANEOUS) ×3 IMPLANT
NS IRRIG 1000ML POUR BTL (IV SOLUTION) ×3 IMPLANT
PACK SHOULDER (CUSTOM PROCEDURE TRAY) ×3 IMPLANT
PAD ARMBOARD 7.5X6 YLW CONV (MISCELLANEOUS) ×6 IMPLANT
PAD COLD SHLDR WRAP-ON (PAD) ×3 IMPLANT
PASSER SUT SWANSON 36MM LOOP (INSTRUMENTS) ×3 IMPLANT
PIN HUMERAL STMN 3.2MMX9IN (INSTRUMENTS) ×2 IMPLANT
PIN THREADED REVERSE (PIN) ×2 IMPLANT
REAMER GUIDE BUSHING SURG DISP (MISCELLANEOUS) ×2 IMPLANT
REAMER GUIDE W/SCREW AUG (MISCELLANEOUS) ×2 IMPLANT
RESTRAINT HEAD UNIVERSAL NS (MISCELLANEOUS) ×3 IMPLANT
SCREW BONE STRL 6.5MMX25MM (Screw) ×2 IMPLANT
SCREW BONE STRL 6.5MMX30MM (Screw) ×2 IMPLANT
SCREW LOCKING 4.75MMX15MM (Screw) ×6 IMPLANT
SCREW LOCKING STRL 4.75X25X3.5 (Screw) ×2 IMPLANT
SLING ARM IMMOBILIZER LRG (SOFTGOODS) ×3 IMPLANT
SOL PREP POV-IOD 4OZ 10% (MISCELLANEOUS) ×3 IMPLANT
SPONGE LAP 18X18 RF (DISPOSABLE) ×3 IMPLANT
STEM SHOULDER (Stem) ×2 IMPLANT
STRIP CLOSURE SKIN 1/2X4 (GAUZE/BANDAGES/DRESSINGS) ×2 IMPLANT
SUCTION FRAZIER HANDLE 10FR (MISCELLANEOUS) ×3
SUCTION TUBE FRAZIER 10FR DISP (MISCELLANEOUS) ×1 IMPLANT
SUT BROADBAND TAPE 2PK 1.5 (SUTURE) ×4 IMPLANT
SUT FIBERWIRE #2 38 T-5 BLUE (SUTURE)
SUT MAXBRAID (SUTURE) IMPLANT
SUT MNCRL AB 3-0 PS2 18 (SUTURE) ×3 IMPLANT
SUT SILK 2 0 TIES 10X30 (SUTURE) ×3 IMPLANT
SUT VIC AB 0 CT1 27 (SUTURE) ×12
SUT VIC AB 0 CT1 27XBRD ANBCTR (SUTURE) ×4 IMPLANT
SUT VIC AB 1 CT1 27 (SUTURE) ×6
SUT VIC AB 1 CT1 27XBRD ANBCTR (SUTURE) ×2 IMPLANT
SUT VIC AB 2-0 CT1 27 (SUTURE) ×9
SUT VIC AB 2-0 CT1 TAPERPNT 27 (SUTURE) ×3 IMPLANT
SUT VICRYL 0 UR6 27IN ABS (SUTURE) ×6 IMPLANT
SUTURE FIBERWR #2 38 T-5 BLUE (SUTURE) IMPLANT
TOWEL GREEN STERILE (TOWEL DISPOSABLE) ×3 IMPLANT
TRAY FOL W/BAG SLVR 16FR STRL (SET/KITS/TRAYS/PACK) IMPLANT
TRAY FOLEY W/BAG SLVR 16FR LF (SET/KITS/TRAYS/PACK)
TRAY HUM REV SHOULDER 36 +3 (Shoulder) ×2 IMPLANT
TRAY HUM REV SHOULDER STD +6 (Shoulder) ×2 IMPLANT
WATER STERILE IRR 1000ML POUR (IV SOLUTION) ×3 IMPLANT

## 2020-02-02 NOTE — Brief Op Note (Signed)
   02/02/2020  3:51 PM  PATIENT:  Lori Orozco  69 y.o. female  PRE-OPERATIVE DIAGNOSIS:  left shoulder rotator cuff arthropathy  POST-OPERATIVE DIAGNOSIS:   left shoulder rotator cuff arthropathy  PROCEDURE:  Procedure(s): LEFT REVERSE SHOULDER ARTHROPLASTY  SURGEON:  Surgeon(s): August Saucer Corrie Mckusick, MD  ASSISTANT: luke magnant pa  ANESTHESIA:   general  EBL: 50 ml    No intake/output data recorded.  BLOOD ADMINISTERED: none  DRAINS: none   LOCAL MEDICATIONS USED: vanco  SPECIMEN:  No Specimen  COUNTS:  YES  TOURNIQUET:  * No tourniquets in log *  DICTATION: .Other Dictation: Dictation Number [id number]  PLAN OF CARE: Admit for overnight observation  PATIENT DISPOSITION:  PACU - hemodynamically stable

## 2020-02-02 NOTE — H&P (Signed)
Lori Orozco is an 69 y.o. female.   Chief Complaint: Left shoulder pain HPI: Lori Orozco is a 69 year old patient with left shoulder pain.  She has had 2 prior surgeries on the left shoulder last 1 in 2019.  She has had pain since that time.  Denies any fevers or chills.  Gets episodic injections the last of which was 4 months ago and only helped her for a week.  She reports pain primarily as a complaint as opposed to loss of function.  She also describes some weakness with overhead activity.  Presents now for reverse shoulder replacement after explanation risk benefits.  Past Medical History:  Diagnosis Date  . Amaurosis fugax   . Anemia    hx  . Anxiety   . Arthritis   . Asthma   . Cataract   . Chronic fatigue syndrome   . Chronic kidney disease    frequency, nephritis when 69 yrs old  . COPD (chronic obstructive pulmonary disease) (HCC)   . COVID-19   . Depression   . Diverticulitis   . Emphysema of lung (HCC)   . Fibromyalgia   . GERD (gastroesophageal reflux disease)    occ  . H/O hiatal hernia   . History of bronchitis   . Hyperlipidemia   . Hypothyroidism   . Interstitial cystitis   . Irritable bowel syndrome   . Lichen sclerosus   . Lumbar herniated disc   . Migraines   . Pneumonia   . PONV (postoperative nausea and vomiting)   . Shortness of breath    exertion   . Thyroid disease    Graves  . Urinary frequency   . Urinary tract infection   . Vertigo     Past Surgical History:  Procedure Laterality Date  . ABDOMINAL HYSTERECTOMY    . bladder sugery     . CAROTID DOPPLAR    . COLONOSCOPY  05/26/2010   avms- otherwise nl , re check 10y  . DEXA-OSTEOPENIA    . DOPPLER ECHOCARDIOGRAPHY    . elbow surgery    . EPICONDYLITIS    . EYE SURGERY Bilateral    cataracts  . FOOT SURGERY Bilateral   . KNEE SURGERY     Left cartilage  . lipoma in second finger right hand    . PLANTAR FASCIA SURGERY Left   . ROTATOR CUFF REPAIR Left 12/2017  .  TEMPOROMANDIBULAR JOINT SURGERY    . TONSILLECTOMY    . TOTAL HIP ARTHROPLASTY Right 03/20/2013   Procedure: Right TOTAL HIP ARTHROPLASTY;  Surgeon: [name], MD;  Location: [facility];  Service: Orthopedics;  Laterality: Right;  Right Total Hip Arthroplasty  . TOTAL KNEE ARTHROPLASTY Left 01/28/2015   Procedure: LEFT TOTAL KNEE ARTHROPLASTY;  Surgeon: [name], MD;  Location: [facility];  Service: Orthopedics;  Laterality: Left;  . TRIGGER FINGER RELEASE Right   . TROCHANTERIC BURSA EXCISION Right 05/03/2016   Dr. [name], [name]  . WRIST ARTHROSCOPY Right    ligament tear    Family History  Problem Relation Age of Onset  . Thyroid disease Mother   . Hypertension Mother   . Kidney disease Mother   . Cancer Father   . Colon cancer Other    Social History:  reports that she quit smoking about 6 years ago. Her smoking use included cigarettes. She has a 86.00 pack-year smoking history. She has never used smokeless tobacco. She reports that she does not drink alcohol and does not use drugs.  Allergies:  Allergies  Allergen Reactions  . Lithium Anaphylaxis  . Tegretol [Carbamazepine] Other (See Comments)    Fever and body aches (fever over 103)  . Tricyclic Antidepressants Anaphylaxis  . Strawberry Extract Hives  . Codeine Nausea Only    Makes pt stay awake  . Cymbalta [Duloxetine Hcl] Other (See Comments)    Makes pt pass out   . Erythromycin     abdominal pain  . Lyrica [Pregabalin]     Felt faint  . Neurontin [Gabapentin]     Passes  out  . Rabeprazole Sodium     insomnia  . Duraprep [employer], Misc.] Itching and Rash    Tolerates Betadine   . Penicillins Rash    Has patient had a PCN reaction causing immediate rash, facial/tongue/throat swelling, SOB or lightheadedness with hypotension: Yes Has patient had a PCN reaction causing severe rash involving mucus membranes or skin necrosis: No Has patient had a PCN reaction that required  hospitalization No Has patient had a PCN reaction occurring within the last 10 years: No If all of the above answers are "NO", then may proceed with Cephalosporin use.   . Tape Rash    PT ALLERGIC NYLON TAPE     Medications Prior to Admission  Medication Sig Dispense Refill  . acetaminophen (TYLENOL) 500 MG tablet Take 1,500 mg by mouth every 8 (eight) hours as needed for moderate pain or headache.    . albuterol (VENTOLIN HFA) 108 (90 Base) MCG/ACT inhaler Inhale 2 puffs into the lungs every 6 (six) hours as needed for wheezing or shortness of breath.    Marland Kitchen buPROPion (WELLBUTRIN XL) 150 MG 24 hr tablet Take 150 mg by mouth daily.      . busPIRone (BUSPAR) 15 MG tablet Take 15 mg by mouth 2 (two) times daily before a meal.     . Cholecalciferol (VITAMIN D3) 2000 UNITS TABS Take 2,000 Units by mouth daily.    . clobetasol cream (TEMOVATE) 0.05 % APPLY SMALL AMOUNT TO AFFECTED AREAS TWO TO THREE TIMES PER WEEK (Patient taking differently: Apply 1 application topically See admin instructions. APPLY SMALL AMOUNT TO AFFECTED AREAS TWO TO THREE TIMES PER WEEK) 30 g 4  . cyanocobalamin 2000 MCG tablet Take 2,000 mcg by mouth daily.    . diphenhydrAMINE (BENADRYL) 25 MG tablet Take 25 mg by mouth every 6 (six) hours as needed for allergies.    Marland Kitchen divalproex (DEPAKOTE ER) 500 MG 24 hr tablet Take 500 mg by mouth at bedtime.     . famotidine (PEPCID) 20 MG tablet Take 1 tablet (20 mg total) by mouth 2 (two) times daily. 60 tablet 11  . fluticasone (FLONASE) 50 MCG/ACT nasal spray PLACE 2 SPRAYS INTO THE NOSE DAILY. (Patient taking differently: Place 2 sprays into both nostrils daily as needed for allergies. ) 16 g 11  . HYDROcodone-acetaminophen (NORCO) 7.5-325 MG tablet Take 1-2 tablets by mouth every 6 (six) hours as needed for moderate pain. 30 tablet 0  . ibuprofen (ADVIL) 200 MG tablet Take 600-800 mg by mouth every 6 (six) hours as needed for headache or moderate pain.    Marland Kitchen ipratropium-albuterol  (DUONEB) 0.5-2.5 (3) MG/3ML SOLN Take 3 mLs by nebulization every 6 (six) hours as needed. (Patient taking differently: Take 3 mLs by nebulization every 6 (six) hours as needed (shortness of breath). ) 120 mL prn  . ketotifen (ZADITOR) 0.025 % ophthalmic solution Place 1 drop into both eyes 2 (two) times daily as needed (  allergies).    Marland Kitchen levothyroxine (SYNTHROID) 50 MCG tablet TAKE ONE TABLET BY MOUTH DAILY (Patient taking differently: Take 50 mcg by mouth at bedtime. ) 90 tablet 3  . LORazepam (ATIVAN) 1 MG tablet Take 1 mg by mouth 2 (two) times daily as needed for anxiety.    . mirabegron ER (MYRBETRIQ) 25 MG TB24 tablet Take 1 tablet (25 mg total) by mouth daily. 30 tablet 11  . mirtazapine (REMERON) 30 MG tablet Take 30 mg by mouth at bedtime.     . ondansetron (ZOFRAN ODT) 4 MG disintegrating tablet Take 1 tablet (4 mg total) by mouth every 8 (eight) hours as needed for nausea or vomiting. 20 tablet 0  . PREMARIN 0.625 MG tablet Take 1 tablet (0.625 mg total) by mouth daily. Take one tablet daily (Patient taking differently: Take 0.625 mg by mouth daily. ) 30 tablet 12  . sertraline (ZOLOFT) 100 MG tablet Take 100 mg by mouth daily.     . simvastatin (ZOCOR) 20 MG tablet Take 1 tablet (20 mg total) by mouth at bedtime. 30 tablet 11  . tiZANidine (ZANAFLEX) 2 MG tablet Take 1 tablet (2 mg total) by mouth every 8 (eight) hours as needed for muscle spasms. 30 tablet 0    No results found for this or any previous visit (from the past 48 hour(s)). No results found.  Review of Systems  Musculoskeletal: Positive for arthralgias.  All other systems reviewed and are negative.   Blood pressure (!) 160/58, pulse 80, SpO2 94 %. Physical Exam Vitals reviewed.  HENT:     Head: Normocephalic.     Nose: Nose normal.     Mouth/Throat:     Mouth: Mucous membranes are moist.  Eyes:     Pupils: Pupils are equal, round, and reactive to light.  Cardiovascular:     Rate and Rhythm: Normal rate.      Pulses: Normal pulses.  Pulmonary:     Effort: Pulmonary effort is normal.  Abdominal:     General: Abdomen is flat.  Musculoskeletal:     Cervical back: Normal range of motion.  Skin:    General: Skin is warm.     Capillary Refill: Capillary refill takes less than 2 seconds.  Neurological:     General: No focal deficit present.     Mental Status: She is alert.  Psychiatric:        Mood and Affect: Mood normal.   Examination of the left shoulder demonstrates functional deltoid.  Patient has external rotation of 15 degrees of abduction about 50 degrees.  Forward flexion to about 130 isolated glenohumeral abduction around 90 to 95 degrees.  Rotator cuff strength is weak to infraspinatus and supraspinatus testing.  Motor sensory function hand is intact.  Radial pulses intact  Assessment/Plan  Impression is left shoulder rotator cuff arthropathy with pain and weakness.  Functionally she does have some overhead motion but she does continue to have significant pain with activities of daily living.  Plan is reverse shoulder replacement.  Risk benefits are discussed with the patient including not limited to infection nerve vessel damage instability dislocation potential need for revision as well as incomplete return of function and incomplete pain relief.  Patient understands risk benefits.  She did undergo preoperative benzyl peroxide scrubbing over the past 3 days.  Patient understands risk benefits and wishes to proceed.  Extensive nature of the rehabilitative process also discussed.  All questions answered. Burnard Bunting, MD 02/02/2020, 11:58 AM

## 2020-02-02 NOTE — Anesthesia Procedure Notes (Signed)
Procedure Name: Intubation Performed by: Ezekiel Ina, CRNA Pre-anesthesia Checklist: Patient identified, Emergency Drugs available, Suction available and Patient being monitored Patient Re-evaluated:Patient Re-evaluated prior to induction Oxygen Delivery Method: Circle System Utilized Preoxygenation: Pre-oxygenation with 100% oxygen Induction Type: IV induction Ventilation: Mask ventilation without difficulty Laryngoscope Size: Miller and 2 Grade View: Grade II Tube type: Oral Tube size: 7.5 mm Number of attempts: 1 Airway Equipment and Method: Stylet and Oral airway Placement Confirmation: ETT inserted through vocal cords under direct vision,  positive ETCO2 and breath sounds checked- equal and bilateral Secured at: 22 cm Tube secured with: Tape Dental Injury: Teeth and Oropharynx as per pre-operative assessment

## 2020-02-02 NOTE — Transfer of Care (Signed)
Immediate Anesthesia Transfer of Care Note  Patient: Lori Orozco  Procedure(s) Performed: LEFT REVERSE SHOULDER ARTHROPLASTY (Left Shoulder)  Patient Location: PACU  Anesthesia Type:General  Level of Consciousness: drowsy  Airway & Oxygen Therapy: Patient Spontanous Breathing and Patient connected to face mask oxygen  Post-op Assessment: Report given to RN and Post -op Vital signs reviewed and stable  Post vital signs: Reviewed and stable  Last Vitals:  Vitals Value Taken Time  BP 136/73 02/02/20 1559  Temp    Pulse 88 02/02/20 1600  Resp 16 02/02/20 1600  SpO2 100 % 02/02/20 1600  Vitals shown include unvalidated device data.  Last Pain:  Vitals:   02/02/20 0948  PainSc: 5          Complications: No complications documented.

## 2020-02-02 NOTE — Anesthesia Procedure Notes (Signed)
Anesthesia Regional Block: Interscalene brachial plexus block   Pre-Anesthetic Checklist: ,, timeout performed, Correct Patient, Correct Site, Correct Laterality, Correct Procedure, Correct Position, site marked, Risks and benefits discussed,  Surgical consent,  Pre-op evaluation,  At surgeon's request and post-op pain management  Laterality: Left and Upper  Prep: chloraprep       Needles:  Injection technique: Single-shot  Needle Type: Echogenic Needle     Needle Length: 9cm  Needle Gauge: 21     Additional Needles:   Procedures:,,,, ultrasound used (permanent image in chart),,,,  Narrative:  Start time: 02/02/2020 10:09 AM End time: 02/02/2020 10:16 AM Injection made incrementally with aspirations every 5 mL.  Performed by: Personally  Anesthesiologist: Jairo Ben, MD  Additional Notes: Pt identified in Holding room.  Monitors applied. Working IV access confirmed. Sterile prep L clavicle and neck.  #21ga ECHOgenic needle to interscalene brachial plexus with US guidance.  10cc 0.5% Bupivacaine with 1:200k epi, Exparel injected incrementally after negative test dose.  Patient asymptomatic, VSS, no heme aspirated, tolerated well.   Sedalia Muta, MD

## 2020-02-03 ENCOUNTER — Encounter (HOSPITAL_COMMUNITY): Payer: Self-pay | Admitting: Orthopedic Surgery

## 2020-02-03 ENCOUNTER — Ambulatory Visit (INDEPENDENT_AMBULATORY_CARE_PROVIDER_SITE_OTHER): Payer: Medicare Other

## 2020-02-03 ENCOUNTER — Ambulatory Visit (INDEPENDENT_AMBULATORY_CARE_PROVIDER_SITE_OTHER): Payer: Medicare Other | Admitting: Orthopedic Surgery

## 2020-02-03 DIAGNOSIS — M25512 Pain in left shoulder: Secondary | ICD-10-CM | POA: Diagnosis not present

## 2020-02-03 DIAGNOSIS — M19012 Primary osteoarthritis, left shoulder: Secondary | ICD-10-CM | POA: Diagnosis not present

## 2020-02-03 MED ORDER — OXYCODONE HCL 5 MG PO TABS: 5.0000 mg | ORAL_TABLET | ORAL | 0 refills | Status: DC | PRN

## 2020-02-03 NOTE — Evaluation (Signed)
Occupational Therapy Evaluation Patient Details Name: Lori Orozco MRN: [id number] DOB: 04/18/1950 Today's Date: 02/03/2020    History of Present Illness 69 y.o. female with prior L shoulder surgery in 2019 presenting with continued L shoulder pain. PMH includes but is not limited to anxiety, chronic fatigue syndrome, CKP, COPD, depression, emphysema, fibromyalia, mingraines, vertigo. Pt underwent L reverse total shoulder on 02/02/2020.   Clinical Impression   Patient evaluated by Occupational Therapy with no further acute OT needs identified. All education has been completed and the patient has no further questions. See below for any follow-up Occupational Therapy or equipment needs. OT to sign off. Thank you for referral.      Follow Up Recommendations  No OT follow up    Equipment Recommendations  None recommended by OT    Recommendations for Other Services       Precautions / Restrictions Precautions Precautions: Shoulder Type of Shoulder Precautions: per Ortho Charles Magnant: AROM as tolerated, lifting restrictions to <1-2 pounds, no ER ROM over 30 degrees Shoulder Interventions:  (sling at all times other than during exercise) Precaution Comments: handout for after shoulder care Required Braces or Orthoses: Sling Restrictions Weight Bearing Restrictions: Yes LUE Weight Bearing: Non weight bearing      Mobility Bed Mobility Overal bed mobility: Independent                  Transfers Overall transfer level: Independent                    Balance Overall balance assessment: Independent                                         ADL either performed or assessed with clinical judgement   ADL Overall ADL's : Needs assistance/impaired Eating/Feeding: Set up   Grooming: Set up     Upper Body Bathing Details (indicate cue type and reason): educated on positioning     Upper Body Dressing : Minimal assistance   Lower Body  Dressing: Moderate assistance Lower Body Dressing Details (indicate cue type and reason): requries help pulling up  Toilet Transfer: Min guard   Toileting- Clothing Manipulation and Hygiene: Min guard       Functional mobility during ADLs: Min guard General ADL Comments: educated on sling    Shoulder precautions ( handout provided): Educated patient on don doff brace with return demonstration,washing under arms with cloth, never to wash directly on incision site, avoid shoulder movement. positioning with pillows in chair for , sleeping positioning (recommend recliner if possible), pt educated on dressing care during bathing/ dressing.  Home exercise program as stated below (indicated by MD).   Vision Baseline Vision/History: Wears glasses Wears Glasses: Reading only       Perception     Praxis      Pertinent Vitals/Pain Pain Assessment: No/denies pain (nerve block still in place)     Hand Dominance Right   Extremity/Trunk Assessment Upper Extremity Assessment Upper Extremity Assessment: LUE deficits/detail LUE Deficits / Details: activation of digits at this time only nerve block still in place   Lower Extremity Assessment Lower Extremity Assessment: Overall WFL for tasks assessed   Cervical / Trunk Assessment Cervical / Trunk Assessment: Normal   Communication Communication Communication: No difficulties   Cognition Arousal/Alertness: Awake/alert Behavior During Therapy: WFL for tasks assessed/performed Overall Cognitive Status: Within Functional Limits for tasks assessed  General Comments  VSS on RA    Exercises Exercises: Shoulder   Shoulder Instructions Shoulder Instructions Donning/doffing shirt without moving shoulder: Minimal assistance Method for sponge bathing under operated UE: Minimal assistance Donning/doffing sling/immobilizer: Minimal assistance Correct positioning of sling/immobilizer: Minimal  assistance Pendulum exercises (written home exercise program): Min-guard ROM for elbow, wrist and digits of operated UE: Modified independent Sling wearing schedule (on at all times/off for ADL's): Modified independent Proper positioning of operated UE when showering: Modified independent Positioning of UE while sleeping: Modified independent    Home Living Family/patient expects to be discharged to:: Private residence Living Arrangements: Non-relatives/Friends Available Help at Discharge: Friend(s);Available 24 hours/day Type of Home: House Home Access: Stairs to enter [employer] of Steps: 1 Entrance Stairs-Rails: Right Home Layout: Two level Alternate Level Stairs-Number of Steps: stair lift   Bathroom Shower/Tub: Producer, television/film/video: Standard     Home Equipment: Environmental consultant - 2 wheels;Cane - single point;Crutches;Bedside commode;Shower seat - built in          Prior Functioning/Environment Level of Independence: Independent                 OT Problem List: Decreased activity tolerance;Impaired balance (sitting and/or standing)      OT Treatment/Interventions:      OT Goals(Current goals can be found in the care plan section) Acute Rehab OT Goals Patient Stated Goal: to go home asap OT Goal Formulation: With patient  OT Frequency:     Barriers to D/C:            Co-evaluation              AM-PAC OT "6 Clicks" Daily Activity     Outcome Measure Help from another person eating meals?: None Help from another person taking care of personal grooming?: None Help from another person toileting, which includes using toliet, bedpan, or urinal?: None Help from another person bathing (including washing, rinsing, drying)?: None Help from another person to put on and taking off regular upper body clothing?: None Help from another person to put on and taking off regular lower body clothing?: None 6 Click Score: 24   End of Session Nurse  Communication: Mobility status;Precautions;Weight bearing status  Activity Tolerance: Patient tolerated treatment well Patient left: in bed;with call bell/phone within reach  OT Visit Diagnosis: Unsteadiness on feet (R26.81)                Time: 0600-4599 OT Time Calculation (min): 31 min Charges:  OT General Charges $OT Visit: 1 Visit OT Evaluation $OT Eval Moderate Complexity: 1 Mod OT Treatments $Self Care/Home Management : 8-22 mins   Brynn, OTR/L  Acute Rehabilitation Services Pager: [phone] Office: [phone] .   Mateo Flow 02/03/2020, 11:55 AM

## 2020-02-03 NOTE — Plan of Care (Signed)
Patient alert and oriented, mae's well, voiding adequate amount of urine, swallowing without difficulty, no c/o pain at time of discharge. Patient discharged home with family. Script and discharged instructions given to patient. Patient and family stated understanding of instructions given. Patient has an appointment with Dr. Dean 

## 2020-02-03 NOTE — Evaluation (Signed)
Physical Therapy Evaluation Patient Details Name: Lori Orozco MRN: [id number] DOB: 1951-01-30 Today's Date: 02/03/2020   History of Present Illness  69 y.o. female with prior L shoulder surgery in 2019 presenting with continued L shoulder pain. PMH includes but is not limited to anxiety, chronic fatigue syndrome, CKP, COPD, depression, emphysema, fibromyalia, mingraines, vertigo. Pt underwent L reverse total shoulder on 02/02/2020.  Clinical Impression  Pt presents to PT s/p L total reverse shoulder surgery. Pt is able to mobilize and ambulate independently at this time, expresses good knowledge of precautions at this time. Pt has sufficient support in the home setting from her roommate and expresses the desire to return home as soon as possible. Pt has no further acute PT needs. PT recommends outpatient PT at the time of discharge.    Follow Up Recommendations Outpatient PT    Equipment Recommendations  None recommended by PT    Recommendations for Other Services       Precautions / Restrictions Precautions Precautions: Shoulder Type of Shoulder Precautions: per Ortho Charles Magnant: AROM as tolerated, lifting restrictions to <1-2 pounds, no ER ROM over 30 degrees Shoulder Interventions:  (sling at all times other than during exercise) Required Braces or Orthoses: Sling Restrictions Weight Bearing Restrictions: Yes LUE Weight Bearing: Non weight bearing      Mobility  Bed Mobility Overal bed mobility: Independent                  Transfers Overall transfer level: Independent                  Ambulation/Gait Ambulation/Gait assistance: Independent Gait Distance (Feet): 200 Feet Assistive device: None Gait Pattern/deviations: WFL(Within Functional Limits) Gait velocity: functional Gait velocity interpretation: 1.31 - 2.62 ft/sec, indicative of limited community ambulator    Stairs Stairs: Yes Stairs assistance: Supervision Stair Management: One  rail Right Number of Stairs: 1    Wheelchair Mobility    Modified Rankin (Stroke Patients Only)       Balance Overall balance assessment: Independent                                           Pertinent Vitals/Pain Pain Assessment: No/denies pain    Home Living Family/patient expects to be discharged to:: Private residence Living Arrangements: Non-relatives/Friends Available Help at Discharge: Friend(s);Available 24 hours/day Type of Home: House Home Access: Stairs to enter Entrance Stairs-Rails: Right Entrance Stairs-Number of Steps: 1 Home Layout: Two level Home Equipment: Walker - 2 wheels;Cane - single point;Crutches;Bedside commode;Shower seat - built in      Prior Function Level of Independence: Independent               Higher education careers adviser        Extremity/Trunk Assessment   Upper Extremity Assessment Upper Extremity Assessment: Defer to OT evaluation    Lower Extremity Assessment Lower Extremity Assessment: Overall WFL for tasks assessed    Cervical / Trunk Assessment Cervical / Trunk Assessment: Normal  Communication   Communication: No difficulties  Cognition Arousal/Alertness: Awake/alert Behavior During Therapy: WFL for tasks assessed/performed Overall Cognitive Status: Within Functional Limits for tasks assessed                                        General Comments General comments (skin  integrity, edema, etc.): VSS on RA    Exercises     Assessment/Plan    PT Assessment Patent does not need any further PT services  PT Problem List         PT Treatment Interventions      PT Goals (Current goals can be found in the Care Plan section)       Frequency     Barriers to discharge        Co-evaluation               AM-PAC PT "6 Clicks" Mobility  Outcome Measure Help needed turning from your back to your side while in a flat bed without using bedrails?: None Help needed moving from  lying on your back to sitting on the side of a flat bed without using bedrails?: None Help needed moving to and from a bed to a chair (including a wheelchair)?: None Help needed standing up from a chair using your arms (e.g., wheelchair or bedside chair)?: None Help needed to walk in hospital room?: None Help needed climbing 3-5 steps with a railing? : A Little 6 Click Score: 23    End of Session   Activity Tolerance: Patient tolerated treatment well Patient left: in bed;with call bell/phone within reach Nurse Communication: Mobility status      Time: 6761-9509 PT Time Calculation (min) (ACUTE ONLY): 16 min   Charges:   PT Evaluation $PT Eval Low Complexity: 1 Low          Arlyss Gandy, PT, DPT Acute Rehabilitation Pager: [phone]   Arlyss Gandy 02/03/2020, 8:55 AM

## 2020-02-03 NOTE — Progress Notes (Signed)
  Subjective: Lori Orozco is a 69 y.o. female s/p left RSA.  They are POD 1.  Pt's pain is controlled.  Block still in effect.    Objective: Vital signs in last 24 hours: Temp:  [97.2 F (36.2 C)-98.4 F (36.9 C)] 97.8 F (36.6 C) (12/08 0734) Pulse Rate:  [72-92] 91 (12/08 0734) Resp:  [10-23] 16 (12/08 0734) BP: (123-194)/(58-91) 142/64 (12/08 0734) SpO2:  [93 %-100 %] 94 % (12/08 0734)  Intake/Output from previous day: 12/07 0701 - 12/08 0700 In: 1050 [I.V.:900] Out: 200 [Blood:200] Intake/Output this shift: No intake/output data recorded.  Exam:  No gross blood or drainage overlying the dressing 2+ radial pulse Sensation intact distally in the left hand but diminished Able to extend the left wrist.  EPL function intact.   Labs: No results for input(s): HGB in the last 72 hours. No results for input(s): WBC, RBC, HCT, PLT in the last 72 hours. No results for input(s): NA, K, CL, CO2, BUN, CREATININE, GLUCOSE, CALCIUM in the last 72 hours. No results for input(s): LABPT, INR in the last 72 hours.  Assessment/Plan: Pt is POD 1 s/p left RSA    -Plan to discharge to home today pending patient's pain  -No lifting with the operative arm     Joycie Peek Maayan Jenning 02/03/2020, 9:10 AM

## 2020-02-03 NOTE — Progress Notes (Addendum)
CSW received request for outpatient PT referral. CSW sent referral to [facility].  UPDATE: CSW received call from [facility] and they have patient as a pre-op referral. They will start services with patient at home. Patient can follow up with outpatient therapy after home health services are completed if needed.   Antwan Pandya LCSW

## 2020-02-03 NOTE — Op Note (Signed)
NAME: Lori Orozco, Lori Orozco MEDICAL RECORD [id number] ACCOUNT [id number] DATE OF BIRTH:1950-03-08 FACILITY: [facility] LOCATION: [facility] PHYSICIAN:Alley Neils Diamantina [place], MD  OPERATIVE REPORT  DATE OF PROCEDURE:  02/02/2020  PREOPERATIVE DIAGNOSIS:  Left rotator cuff arthropathy and arthritis.  POSTOPERATIVE DIAGNOSIS:  Left rotator cuff arthropathy and arthritis.  PROCEDURE:  Left reverse shoulder replacement using [employer] comprehensive reverse shoulder system with small augmented baseplate with 1 central screw, 4 peripheral fixed locking screws, 36 standard glenosphere, 10 mm mini humeral stem with +6  mini humeral tray, 40 mm in diameter with 36 mm +3 highly cross-linked polyethylene bearing.  SURGEON:  [name], MD  ASSISTANT:  Karenann Cai, PA.  INDICATIONS:  The patient is a 69 year old patient with left shoulder pain refractory to nonoperative management, who presents for operative management after explanation of risks and benefits.  PROCEDURE:  The patient was brought to the operating room where general anesthetic was induced.  Preoperative antibiotics administered.  Timeout was called.  Left shoulder prescrubbed with hydrogen peroxide, alcohol and Betadine and then prepped with  ChloraPrep solution and draped in sterile manner.  The patient did undergo benzoyl peroxide prescrub for 3 days prior to the procedure.  Ioban used to cover the operative field.  Timeout was called.  Deltopectoral approach was made.  Skin and  subcutaneous tissue were sharply divided.  Cephalic vein present.  Interval between the pectoralis and the deltoid developed.  Deltoid released anteriorly manually to assist with mobilization.  Biceps tendon was tenodesed to the pec tendon using 0 Vicryl  suture.  Rotator interval was opened up to the base of the glenoid.  The circumflex vessels were ligated.  Axillary nerve was identified and visualized and a vessel loop placed around it.  Protected at all  times during the case.  Next, the subscapularis  was detached and the capsule was then detached from the inferior humeral neck down to the 7 o'clock position.  About 2 cm of capsular detachment was also performed to mobilize the humerus away from the glenoid.  The humeral head was dislocated.  Reaming  was performed up to a size 11 and then the head was cut in 30 degrees of retroversion.  The patient had a very nice fit with a 10 broach.  A cap was placed and posterior retractor was placed.  Bankart lesion created from the 12 o'clock to 7 o'clock  position.  A 360 degree capsular release was performed.  Girtha Rm was also mobilized.  Next, anterior retractor was placed.  Using patient specific instrumentation, the guide was placed and the central pin was placed.  Reaming was performed in accordance  with preoperative templating to about 6-7 degrees of inferior tilt.  Good bleeding bone was encountered inferiorly, but bony surface was prepped with good bleeding bone surface achieved as well.  Augmented reaming was then performed.  Good fit was then  achieved.  A baseplate was then placed with 1 central compression screw and 4 peripheral locking screws.  Good fixation achieved.  A standard glenosphere was then placed with about 1.5 mm inferior offset.  Next, attention was directed towards the  humerus.  With a 10 broach in place, we trialled with a +6 offset, +0 polyethylene liner and +3 polyethylene liner.  The +3 liner gave very good stability was difficult to reduce and allowed for excellent range of motion, particularly with adduction,  extension and a forward force.  Trial components were removed on the humeral side, true components placed after drilling and  placing 4 suture tapes for subscap repair.  A thorough irrigation was then performed.  IrriSept was used after the incision,  after the arthrotomy and at multiple times during the case.  With the shoulder reduced, axillary nerve again palpated and found  to be intact.  Next, subscapularis was repaired and the patient had about 60 degrees of external rotation after subscap  repair.  IrriSept solution followed by Vancomycin powder was then placed within the incision.  Deltopectoral interval was then closed over vancomycin powder as well using #1 Vicryl suture.  Watertight closure was achieved.  The remaining tissue was then  closed using 0 Vicryl suture, 2-0 Vicryl suture and a 3-0 Monocryl.  Steri-Strips, Aquacel dressing applied.  The patient tolerated the procedure well without immediate complications.  He was placed in a shoulder sling.  Luke's assistance was required at  all times for the case for limb positioning, retraction, opening and closing.  His assistance was a medical necessity.  HN/NUANCE  D:02/02/2020 T:02/03/2020 JOB:[id number]

## 2020-02-04 NOTE — Anesthesia Postprocedure Evaluation (Signed)
Anesthesia Post Note  Patient: Lori Orozco  Procedure(s) Performed: LEFT REVERSE SHOULDER ARTHROPLASTY (Left Shoulder)     Patient location during evaluation: PACU Anesthesia Type: General and Regional Level of consciousness: awake and alert Pain management: pain level controlled Vital Signs Assessment: post-procedure vital signs reviewed and stable Respiratory status: spontaneous breathing, nonlabored ventilation, respiratory function stable and patient connected to nasal cannula oxygen Cardiovascular status: blood pressure returned to baseline and stable Postop Assessment: no apparent nausea or vomiting Anesthetic complications: no   No complications documented.  Last Vitals:  Vitals:   02/03/20 0414 02/03/20 0734  BP: (!) 153/72 (!) 142/64  Pulse: 83 91  Resp: 18 16  Temp: 36.9 C 36.6 C  SpO2: 93% 94%    Last Pain:  Vitals:   02/03/20 0734  TempSrc: Oral  PainSc:    Pain Goal:                   [name]

## 2020-02-06 ENCOUNTER — Encounter: Payer: Self-pay | Admitting: Orthopedic Surgery

## 2020-02-06 NOTE — Progress Notes (Signed)
Post-Op Visit Note   Patient: Lori Orozco           Date of Birth: March 05, 1950           MRN: [id number] Visit Date: 02/03/2020 PCP: [name], MD   Assessment & Plan:  Chief Complaint:  Chief Complaint  Patient presents with  . Left Shoulder - Injury   Visit Diagnoses:  1. Left shoulder pain, unspecified chronicity     Plan: Revecca underwent left reverse shoulder replacement 02/02/2020.  Went home and actually fell on the left shoulder.  Block is still in effect.  Incision intact.  Radiographs showed no fracture.  Plan is to continue with passive range of motion and we will see her back for her scheduled appointment.  No indication of immediate hardware or shoulder complication.  Follow-Up Instructions: No follow-ups on file.   Orders:  Orders Placed This Encounter  Procedures  . XR Shoulder Left   No orders of the defined types were placed in this encounter.   Imaging: No results found.  PMFS History: Patient Active Problem List   Diagnosis Date Noted  . S/P reverse total shoulder arthroplasty, 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  . Lung nodule < 6cm on CT 01/02/2020  . Rash and nonspecific skin eruption 12/17/2018  . Fever 09/30/2018  . Status post arthroscopy of left shoulder 01/16/2018  . Estrogen deficiency 06/28/2017  . Medial epicondylitis, left elbow 04/29/2017  . S/P arthroscopy of left shoulder 02/25/2017  . Impingement syndrome of left shoulder 01/30/2017  . Pedal edema 10/26/2016  . Trochanteric bursitis, right hip 04/10/2016  . Elevated BP without diagnosis of hypertension 09/02/2015  . Urge incontinence 07/27/2015  . Obesity 04/28/2015  . Need for hepatitis C screening test 04/26/2015  . Screening for HIV (human immunodeficiency virus) 04/26/2015  . Osteoarthritis of left knee 01/28/2015  . Status post total left knee replacement 01/28/2015  . Vitamin D deficiency 04/27/2014  . Seasonal and perennial allergic rhinitis 06/17/2013  . S/P total hip  arthroplasty 03/20/2013  . 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 Encounter for Medicare annual wellness exam 11/18/2012  . History of falling 11/18/2012  . Routine general medical examination at a health care facility 11/10/2012  . 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 Osteopenia 10/19/2011  . Other screening mammogram 10/19/2011  . Hypothyroid 10/19/2011  . ANEMIA 10/11/2009  . PERIPHERAL NEUROPATHY, FEET 08/05/2009  . BACK PAIN 08/05/2009  . HAND PAIN, BILATERAL 08/05/2009  . TREMOR 03/09/2008  . MENOPAUSAL SYNDROME 10/09/2007  . DEPRESSION, MAJOR 05/20/2007  . BIPOLAR AFFECTIVE DISORDER 05/20/2007  . ANXIETY 05/20/2007  . 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 ESOPHAGEAL SPASM 05/20/2007  . HIATAL HERNIA 05/20/2007  . AMAUROSIS FUGAX 05/07/2007  . COPD mixed type (HCC) 03/27/2007  . HYPERCHOLESTEROLEMIA, PURE 12/10/2006  . SYMPTOM, SYNDROME, CHRONIC FATIGUE 12/10/2006   Past Medical History:  Diagnosis Date  . Amaurosis fugax   . Anemia    hx  . Anxiety   . Arthritis   . Asthma   . Cataract   . Chronic fatigue syndrome   . Chronic kidney disease    frequency, nephritis when 69 yrs old  . COPD (chronic obstructive pulmonary disease) (HCC)   . COVID-19   . Depression   . Diverticulitis   . Emphysema of lung (HCC)   . Fibromyalgia   . GERD (gastroesophageal reflux disease)    occ  . H/O hiatal hernia   . History of  bronchitis   . Hyperlipidemia   . Hypothyroidism   . Interstitial cystitis   . Irritable bowel syndrome   . Lichen sclerosus   . Lumbar herniated disc   . Migraines   . Pneumonia   . PONV (postoperative nausea and vomiting)   . Shortness of breath    exertion   . Thyroid disease    Graves  . Urinary frequency   . Urinary tract infection   . Vertigo     Family History  Problem Relation Age of Onset  . Thyroid disease Mother   . Hypertension Mother   . Kidney disease Mother   . Cancer Father   . Colon  cancer Other     Past Surgical History:  Procedure Laterality Date  . ABDOMINAL HYSTERECTOMY    . bladder sugery     . CAROTID DOPPLAR    . COLONOSCOPY  05/26/2010   avms- otherwise nl , re check 10y  . DEXA-OSTEOPENIA    . DOPPLER ECHOCARDIOGRAPHY    . elbow surgery    . EPICONDYLITIS    . EYE SURGERY Bilateral    cataracts  . FOOT SURGERY Bilateral   . KNEE SURGERY     Left cartilage  . lipoma in second finger right hand    . PLANTAR FASCIA SURGERY Left   . REVERSE SHOULDER ARTHROPLASTY Left 02/02/2020   Procedure: LEFT REVERSE SHOULDER ARTHROPLASTY;  Surgeon: [name], MD;  Location: [facility] OR;  Service: Orthopedics;  Laterality: Left;  . ROTATOR CUFF REPAIR Left 12/2017  . TEMPOROMANDIBULAR JOINT SURGERY    . TONSILLECTOMY    . TOTAL HIP ARTHROPLASTY Right 03/20/2013   Procedure: Right TOTAL HIP ARTHROPLASTY;  Surgeon: [name], MD;  Location: [facility] OR;  Service: Orthopedics;  Laterality: Right;  Right Total Hip Arthroplasty  . TOTAL KNEE ARTHROPLASTY Left 01/28/2015   Procedure: LEFT TOTAL KNEE ARTHROPLASTY;  Surgeon: [name], MD;  Location: [facility] ORS;  Service: Orthopedics;  Laterality: Left;  . TRIGGER FINGER RELEASE Right   . TROCHANTERIC BURSA EXCISION Right 05/03/2016   Dr. [name], [name]  . WRIST ARTHROSCOPY Right    ligament tear   Social History   Occupational History  . Not on file  Tobacco Use  . Smoking status: Former Smoker    Packs/day: 2.00    Years: 43.00    Pack years: 86.00    Types: Cigarettes    Quit date: 02/26/2013    Years since quitting: 6.9  . Smokeless tobacco: Never Used  . Tobacco comment: quit in 2015  Vaping Use  . Vaping Use: Some days  . Devices: no nicotine in it  Substance and Sexual Activity  . Alcohol use: No    Alcohol/week: 0.0 standard drinks  . Drug use: No  . Sexual activity: Not Currently

## 2020-02-12 ENCOUNTER — Telehealth: Payer: Self-pay | Admitting: Orthopedic Surgery

## 2020-02-12 NOTE — Telephone Encounter (Signed)
noted 

## 2020-02-12 NOTE — Telephone Encounter (Signed)
Lori Orozco from [facility] called. Says he has not been able to do an OT Eval this week do to patient having other appointments. Will try next week. His call back number is [phone]

## 2020-02-17 ENCOUNTER — Ambulatory Visit (INDEPENDENT_AMBULATORY_CARE_PROVIDER_SITE_OTHER): Payer: Medicare Other | Admitting: Orthopedic Surgery

## 2020-02-17 ENCOUNTER — Other Ambulatory Visit: Payer: Self-pay

## 2020-02-17 ENCOUNTER — Encounter: Payer: Self-pay | Admitting: Orthopedic Surgery

## 2020-02-17 VITALS — Ht 67.0 in | Wt 200.0 lb

## 2020-02-17 DIAGNOSIS — M25512 Pain in left shoulder: Secondary | ICD-10-CM

## 2020-02-17 DIAGNOSIS — Z96612 Presence of left artificial shoulder joint: Secondary | ICD-10-CM

## 2020-02-17 DIAGNOSIS — G8929 Other chronic pain: Secondary | ICD-10-CM

## 2020-02-17 DIAGNOSIS — Z9889 Other specified postprocedural states: Secondary | ICD-10-CM

## 2020-02-18 ENCOUNTER — Telehealth: Payer: Self-pay | Admitting: Orthopedic Surgery

## 2020-02-18 ENCOUNTER — Encounter: Payer: Self-pay | Admitting: Orthopedic Surgery

## 2020-02-18 NOTE — Telephone Encounter (Signed)
[facility] called and there was some Scheduling difficulties and he's gonna try again next week. His number is [phone]

## 2020-02-18 NOTE — Progress Notes (Signed)
Post-Op Visit Note   Patient: Lori Orozco           Date of Birth: 1950-05-03           MRN: [id number] Visit Date: 02/17/2020 PCP: [name], MD   Assessment & Plan:  Chief Complaint:  Chief Complaint  Patient presents with  . Left Shoulder - Routine Post Op    02/02/2020 Left reverse shoulder arthroplasty   Visit Diagnoses:  1. Chronic left shoulder pain   2. S/P arthroscopy of left shoulder   3. Status post reverse total replacement of left shoulder     Plan: Chia is a patient is 2 weeks out from reverse shoulder replacement.  She states she is doing well and is pain-free.  She is on CPM machine up to 90 degrees.  She took 1 oxycodone after surgery and has not had any medication since.  On exam she has forward flexion and abduction in to just below 90 degrees which is very good for 2 weeks out.  Deltoid is functional.  Motor sensory function to the hand is intact.  Incision intact.  Plan is 1 more week CPM and then transition to outpatient physical therapy next week.  Come back in 4 weeks for clinical recheck.  Discontinue sling use but no lifting more than 5 pounds.  She will need radiographs on return since she did not get any today.  Follow-Up Instructions: No follow-ups on file.   Orders:  Orders Placed This Encounter  Procedures  . Ambulatory referral to Physical Therapy   No orders of the defined types were placed in this encounter.   Imaging: No results found.  PMFS History: Patient Active Problem List   Diagnosis Date Noted  . S/P reverse total shoulder arthroplasty, 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  . Lung nodule < 6cm on CT 01/02/2020  . Rash and nonspecific skin eruption 12/17/2018  . Fever 09/30/2018  . Status post arthroscopy of left shoulder 01/16/2018  . Estrogen deficiency 06/28/2017  . Medial epicondylitis, left elbow 04/29/2017  . S/P arthroscopy of left shoulder 02/25/2017  . Impingement syndrome of left shoulder 01/30/2017  . Pedal edema  10/26/2016  . Trochanteric bursitis, right hip 04/10/2016  . Elevated BP without diagnosis of hypertension 09/02/2015  . Urge incontinence 07/27/2015  . Obesity 04/28/2015  . Need for hepatitis C screening test 04/26/2015  . Screening for HIV (human immunodeficiency virus) 04/26/2015  . Osteoarthritis of left knee 01/28/2015  . Status post total left knee replacement 01/28/2015  . Vitamin D deficiency 04/27/2014  . Seasonal and perennial allergic rhinitis 06/17/2013  . S/P total hip arthroplasty 03/20/2013  . 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 Encounter for Medicare annual wellness exam 11/18/2012  . History of falling 11/18/2012  . Routine general medical examination at a health care facility 11/10/2012  . 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 Osteopenia 10/19/2011  . Other screening mammogram 10/19/2011  . Hypothyroid 10/19/2011  . ANEMIA 10/11/2009  . PERIPHERAL NEUROPATHY, FEET 08/05/2009  . BACK PAIN 08/05/2009  . HAND PAIN, BILATERAL 08/05/2009  . TREMOR 03/09/2008  . MENOPAUSAL SYNDROME 10/09/2007  . DEPRESSION, MAJOR 05/20/2007  . BIPOLAR AFFECTIVE DISORDER 05/20/2007  . ANXIETY 05/20/2007  . 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 ESOPHAGEAL SPASM 05/20/2007  . HIATAL HERNIA 05/20/2007  . AMAUROSIS FUGAX 05/07/2007  . COPD mixed type (HCC) 03/27/2007  . HYPERCHOLESTEROLEMIA, PURE 12/10/2006  . SYMPTOM, SYNDROME, CHRONIC FATIGUE 12/10/2006   Past  Medical History:  Diagnosis Date  . Amaurosis fugax   . Anemia    hx  . Anxiety   . Arthritis   . Asthma   . Cataract   . Chronic fatigue syndrome   . Chronic kidney disease    frequency, nephritis when 69 yrs old  . COPD (chronic obstructive pulmonary disease) (HCC)   . COVID-19   . Depression   . Diverticulitis   . Emphysema of lung (HCC)   . Fibromyalgia   . GERD (gastroesophageal reflux disease)    occ  . H/O hiatal hernia   . History of bronchitis   . Hyperlipidemia    . Hypothyroidism   . Interstitial cystitis   . Irritable bowel syndrome   . Lichen sclerosus   . Lumbar herniated disc   . Migraines   . Pneumonia   . PONV (postoperative nausea and vomiting)   . Shortness of breath    exertion   . Thyroid disease    Graves  . Urinary frequency   . Urinary tract infection   . Vertigo     Family History  Problem Relation Age of Onset  . Thyroid disease Mother   . Hypertension Mother   . Kidney disease Mother   . Cancer Father   . Colon cancer Other     Past Surgical History:  Procedure Laterality Date  . ABDOMINAL HYSTERECTOMY    . bladder sugery     . CAROTID DOPPLAR    . COLONOSCOPY  05/26/2010   avms- otherwise nl , re check 10y  . DEXA-OSTEOPENIA    . DOPPLER ECHOCARDIOGRAPHY    . elbow surgery    . EPICONDYLITIS    . EYE SURGERY Bilateral    cataracts  . FOOT SURGERY Bilateral   . KNEE SURGERY     Left cartilage  . lipoma in second finger right hand    . PLANTAR FASCIA SURGERY Left   . REVERSE SHOULDER ARTHROPLASTY Left 02/02/2020   Procedure: LEFT REVERSE SHOULDER ARTHROPLASTY;  Surgeon: [name], MD;  Location: [facility] OR;  Service: Orthopedics;  Laterality: Left;  . ROTATOR CUFF REPAIR Left 12/2017  . TEMPOROMANDIBULAR JOINT SURGERY    . TONSILLECTOMY    . TOTAL HIP ARTHROPLASTY Right 03/20/2013   Procedure: Right TOTAL HIP ARTHROPLASTY;  Surgeon: [name], MD;  Location: [facility] OR;  Service: Orthopedics;  Laterality: Right;  Right Total Hip Arthroplasty  . TOTAL KNEE ARTHROPLASTY Left 01/28/2015   Procedure: LEFT TOTAL KNEE ARTHROPLASTY;  Surgeon: [name], MD;  Location: [facility];  Service: Orthopedics;  Laterality: Left;  . TRIGGER FINGER RELEASE Right   . TROCHANTERIC BURSA EXCISION Right 05/03/2016   Dr. [name], [name]  . WRIST ARTHROSCOPY Right    ligament tear   Social History   Occupational History  . Not on file  Tobacco Use  . Smoking status: Former Smoker    Packs/day: 2.00     Years: 43.00    Pack years: 86.00    Types: Cigarettes    Quit date: 02/26/2013    Years since quitting: 6.9  . Smokeless tobacco: Never Used  . Tobacco comment: quit in 2015  Vaping Use  . Vaping Use: Some days  . Devices: no nicotine in it  Substance and Sexual Activity  . Alcohol use: No    Alcohol/week: 0.0 standard drinks  . Drug use: No  . Sexual activity: Not Currently

## 2020-02-18 NOTE — Discharge Summary (Signed)
Physician Discharge Summary      Patient ID: Lori Orozco MRN: [id number] DOB/AGE: [birth year] 69 y.o.  Admit date: 02/02/2020 Discharge date: 02/03/20  Admission Diagnoses:  Active Problems:   S/P reverse total shoulder arthroplasty, left   Discharge Diagnoses:  Same  Surgeries: Procedure(s): LEFT REVERSE SHOULDER ARTHROPLASTY on 02/02/2020   Consultants:   Discharged Condition: Stable  Hospital Course: Lori Orozco is an 69 y.o. female who was admitted 02/02/2020 with a chief complaint of left shoulder pain, and found to have a diagnosis of left shoulder rotator cuff arthropathy.  They were brought to the operating room on 02/02/2020 and underwent the above named procedures.  Pt awoke from anesthesia without complication and was transferred to the floor. On POD1, patient was doing very well and mobilized well in PT with minimal pain.  She was discharged home on POD1.  Pt will f/u with Dr. August Saucer in clinic in ~2 weeks.   Antibiotics given:  Anti-infectives (From admission, onward)   Start     Dose/Rate Route Frequency Ordered Stop   02/02/20 2200  ceFAZolin (ANCEF) IVPB 2g/100 mL premix        2 g 200 mL/hr over 30 Minutes Intravenous Every 8 hours 02/02/20 1924 02/03/20 0539   02/02/20 1401  vancomycin (VANCOCIN) powder  Status:  Discontinued          As needed 02/02/20 1401 02/02/20 1554   02/02/20 1208  ceFAZolin (ANCEF) 2-4 GM/100ML-% IVPB       Note to Pharmacy: Carney Harder   : cabinet override      02/02/20 1208 02/03/20 0014   02/02/20 0915  vancomycin (VANCOCIN) IVPB 1000 mg/200 mL premix        1,000 mg 200 mL/hr over 60 Minutes Intravenous On call to O.R. 02/02/20 0902 02/02/20 1130   02/02/20 0907  vancomycin (VANCOCIN) 1-5 GM/200ML-% IVPB       Note to Pharmacy: Dia Crawford   : cabinet override      02/02/20 0907 02/02/20 2114    .  Recent vital signs:  Vitals:   02/03/20 0414 02/03/20 0734  BP: (!) 153/72 (!) 142/64  Pulse: 83 91  Resp: 18  16  Temp: 98.4 F (36.9 C) 97.8 F (36.6 C)  SpO2: 93% 94%    Recent laboratory studies:  Results for orders placed or performed during the hospital encounter of 01/28/20  Urine culture   Specimen: Urine, Clean Catch  Result Value Ref Range   Specimen Description URINE, CLEAN CATCH    Special Requests NONE    Culture      NO GROWTH Performed at [facility], [address], [place], [place] [address]    Report Status 01/29/2020 FINAL   Surgical pcr screen   Specimen: Nasal Mucosa; Nasal Swab  Result Value Ref Range   MRSA, PCR NEGATIVE NEGATIVE   Staphylococcus aureus POSITIVE (A) NEGATIVE  Basic metabolic panel  Result Value Ref Range   Sodium 137 135 - 145 mmol/L   Potassium 4.2 3.5 - 5.1 mmol/L   Chloride 102 98 - 111 mmol/L   CO2 27 22 - 32 mmol/L   Glucose, Bld 97 70 - 99 mg/dL   BUN 22 8 - 23 mg/dL   Creatinine, Ser 6.57 (H) 0.44 - 1.00 mg/dL   Calcium 9.3 8.9 - 84.6 mg/dL   GFR, Estimated 54 (L) >60 mL/min   Anion gap 8 5 - 15  CBC  Result Value Ref Range  WBC 4.9 4.0 - 10.5 K/uL   RBC 3.67 (L) 3.87 - 5.11 MIL/uL   Hemoglobin 11.4 (L) 12.0 - 15.0 g/dL   HCT 43.3 29.5 - 18.8 %   MCV 98.1 80.0 - 100.0 fL   MCH 31.1 26.0 - 34.0 pg   MCHC 31.7 30.0 - 36.0 g/dL   RDW 41.6 60.6 - 30.1 %   Platelets 199 150 - 400 K/uL   nRBC 0.0 0.0 - 0.2 %  Urinalysis, Routine w reflex microscopic Urine, Clean Catch  Result Value Ref Range   Color, Urine YELLOW YELLOW   APPearance CLEAR CLEAR   Specific Gravity, Urine 1.020 1.005 - 1.030   pH 5.0 5.0 - 8.0   Glucose, UA NEGATIVE NEGATIVE mg/dL   Hgb urine dipstick NEGATIVE NEGATIVE   Bilirubin Urine NEGATIVE NEGATIVE   Ketones, ur NEGATIVE NEGATIVE mg/dL   Protein, ur NEGATIVE NEGATIVE mg/dL   Nitrite NEGATIVE NEGATIVE   Leukocytes,Ua NEGATIVE NEGATIVE    Discharge Medications:   Allergies as of 02/03/2020      Reactions   Lithium Anaphylaxis   Tegretol [carbamazepine] Other (See Comments)   Fever and body  aches (fever over 103)   Tricyclic Antidepressants Anaphylaxis   Strawberry Extract Hives   Codeine Nausea Only   Makes pt stay awake   Cymbalta [duloxetine Hcl] Other (See Comments)   Makes pt pass out   Erythromycin    abdominal pain   Lyrica [pregabalin]    Felt faint   Neurontin [gabapentin]    Passes  out   Rabeprazole Sodium    insomnia   Duraprep [antiseptic Products, Misc.] Itching, Rash   Tolerates Betadine   Penicillins Rash   Tolerated ANCEF 02/02/20 Has patient had a PCN reaction causing immediate rash, facial/tongue/throat swelling, SOB or lightheadedness with hypotension: Yes Has patient had a PCN reaction causing severe rash involving mucus membranes or skin necrosis: No Has patient had a PCN reaction that required hospitalization No Has patient had a PCN reaction occurring within the last 10 years: No If all of the above answers are "NO", then may proceed with Cephalosporin use.   Tape Rash   PT ALLERGIC NYLON TAPE       Medication List    STOP taking these medications   HYDROcodone-acetaminophen 7.5-325 MG tablet Commonly known as: NORCO   ibuprofen 200 MG tablet Commonly known as: ADVIL   LORazepam 1 MG tablet Commonly known as: ATIVAN     TAKE these medications   acetaminophen 500 MG tablet Commonly known as: TYLENOL Take 1,500 mg by mouth every 8 (eight) hours as needed for moderate pain or headache.   albuterol 108 (90 Base) MCG/ACT inhaler Commonly known as: VENTOLIN HFA Inhale 2 puffs into the lungs every 6 (six) hours as needed for wheezing or shortness of breath.   buPROPion 150 MG 24 hr tablet Commonly known as: WELLBUTRIN XL Take 150 mg by mouth daily.   busPIRone 15 MG tablet Commonly known as: BUSPAR Take 15 mg by mouth 2 (two) times daily before a meal.   clobetasol cream 0.05 % Commonly known as: TEMOVATE APPLY SMALL AMOUNT TO AFFECTED AREAS TWO TO THREE TIMES PER WEEK What changed:   how much to take  how to take  this  when to take this   cyanocobalamin 2000 MCG tablet Take 2,000 mcg by mouth daily.   diphenhydrAMINE 25 MG tablet Commonly known as: BENADRYL Take 25 mg by mouth every 6 (six) hours as needed for allergies.  divalproex 500 MG 24 hr tablet Commonly known as: DEPAKOTE ER Take 500 mg by mouth at bedtime.   famotidine 20 MG tablet Commonly known as: PEPCID Take 1 tablet (20 mg total) by mouth 2 (two) times daily.   fluticasone 50 MCG/ACT nasal spray Commonly known as: FLONASE PLACE 2 SPRAYS INTO THE NOSE DAILY. What changed:   how much to take  how to take this  when to take this  reasons to take this  additional instructions   ipratropium-albuterol 0.5-2.5 (3) MG/3ML Soln Commonly known as: DUONEB Take 3 mLs by nebulization every 6 (six) hours as needed. What changed: reasons to take this   ketotifen 0.025 % ophthalmic solution Commonly known as: ZADITOR Place 1 drop into both eyes 2 (two) times daily as needed (allergies).   levothyroxine 50 MCG tablet Commonly known as: SYNTHROID TAKE ONE TABLET BY MOUTH DAILY What changed:   how much to take  how to take this  when to take this  additional instructions   mirabegron ER 25 MG Tb24 tablet Commonly known as: Myrbetriq Take 1 tablet (25 mg total) by mouth daily.   mirtazapine 30 MG tablet Commonly known as: REMERON Take 30 mg by mouth at bedtime.   ondansetron 4 MG disintegrating tablet Commonly known as: Zofran ODT Take 1 tablet (4 mg total) by mouth every 8 (eight) hours as needed for nausea or vomiting.   oxyCODONE 5 MG immediate release tablet Commonly known as: Oxy IR/ROXICODONE Take 1 tablet (5 mg total) by mouth every 4 (four) hours as needed for moderate pain (pain score 4-6).   Premarin 0.625 MG tablet Generic drug: estrogens (conjugated) Take 1 tablet (0.625 mg total) by mouth daily. Take one tablet daily What changed: additional instructions   sertraline 100 MG tablet Commonly  known as: ZOLOFT Take 100 mg by mouth daily.   simvastatin 20 MG tablet Commonly known as: ZOCOR Take 1 tablet (20 mg total) by mouth at bedtime.   tiZANidine 2 MG tablet Commonly known as: ZANAFLEX Take 1 tablet (2 mg total) by mouth every 8 (eight) hours as needed for muscle spasms.   Vitamin D3 50 MCG (2000 UT) Tabs Take 2,000 Units by mouth daily.       Diagnostic Studies: DG Shoulder Left Port  Result Date: 02/02/2020 CLINICAL DATA:  Status post shoulder surgery. EXAM: LEFT SHOULDER COMPARISON:  January 07, 2020 FINDINGS: The patient is status post reversed total shoulder arthroplasty on the left. The hardware appears grossly intact. There are expected postsurgical changes. There is no evidence for a periprosthetic fracture. IMPRESSION: Status post reverse total shoulder arthroplasty on the left. Electronically Signed   By: Katherine Mantle M.D.   On: 02/02/2020 19:16   XR Shoulder Left  Result Date: 02/04/2020 2 views left shoulder reviewed.  Reverse shoulder prosthesis in good position alignment with no complicating features.  Shoulder is located.  No injury to the clavicle or acromion visualized.   Disposition: Discharge disposition: 01-Home or Self Care       Discharge Instructions    Ambulatory referral to Physical Therapy   Complete by: As directed    No ER past 30 degrees. Focus on gentle PROM for the first 2 weeks following surgery.  No strengthening until ~6 weeks out from procedure.  Call [facility] at [phone] with any questions or for any needed clarifications.   Call MD / Call 911   Complete by: As directed    If you experience chest pain or shortness  of breath, CALL 911 and be transported to the hospital emergency room.  If you develope a fever above 101 F, pus (white drainage) or increased drainage or redness at the wound, or calf pain, call your surgeon's office.   Call MD / Call 911   Complete by: As directed    If you experience chest pain or  shortness of breath, CALL 911 and be transported to the hospital emergency room.  If you develope a fever above 101 F, pus (white drainage) or increased drainage or redness at the wound, or calf pain, call your surgeon's office.   Constipation Prevention   Complete by: As directed    Drink plenty of fluids.  Prune juice may be helpful.  You may use a stool softener, such as Colace (over the counter) 100 mg twice a day.  Use MiraLax (over the counter) for constipation as needed.   Constipation Prevention   Complete by: As directed    Drink plenty of fluids.  Prune juice may be helpful.  You may use a stool softener, such as Colace (over the counter) 100 mg twice a day.  Use MiraLax (over the counter) for constipation as needed.   Diet - low sodium heart healthy   Complete by: As directed    Diet - low sodium heart healthy   Complete by: As directed    Discharge instructions   Complete by: As directed    You may shower, dressing is waterproof.  Do not bathe or soak the operative shoulder in a tub, pool.  Use the CPM machine 3 times a day for one hour each time.  No lifting with the operative shoulder. Continue use of the sling.  Follow-up with Dr. August Saucer in ~2 weeks on your given appointment date.  We will remove your adhesive bandage at that time as well as start outpatient PT.   Dental Antibiotics:  In most cases prophylactic antibiotics for Dental procdeures after total joint surgery are not necessary.  Exceptions are as follows:  1. History of prior total joint infection  2. Severely immunocompromised (Organ Transplant, cancer chemotherapy, Rheumatoid biologic meds such as Humera)  3. Poorly controlled diabetes (A1C &gt; 8.0, blood glucose over 200)  If you have one of these conditions, contact your surgeon for an antibiotic prescription, prior to your dental procedure.   Increase activity slowly as tolerated   Complete by: As directed    Increase activity slowly as tolerated    Complete by: As directed        Follow-up Information    [facility] Follow up.   Specialty: Rehabilitation Why: Please contact them regarding referral if you need outpatient therapy after home health services. Contact information: [address] [id number] [place] [address] [phone]       [name], MD Follow up.   Specialty: Orthopedic Surgery Contact information: [address] [place] [address] [phone]        [facility] Follow up.   Specialty: Home Health Services Why: Home Health Services arranged. Contact information: [address] [place] [address] [phone]                Signed: Julieanne Cotton 02/18/2020, 9:20 AM

## 2020-02-18 NOTE — Telephone Encounter (Signed)
FYI

## 2020-02-18 NOTE — Telephone Encounter (Signed)
thanks

## 2020-02-25 ENCOUNTER — Ambulatory Visit (INDEPENDENT_AMBULATORY_CARE_PROVIDER_SITE_OTHER): Payer: Medicare Other | Admitting: Rehabilitative and Restorative Service Providers"

## 2020-02-25 ENCOUNTER — Other Ambulatory Visit: Payer: Self-pay

## 2020-02-25 DIAGNOSIS — M6281 Muscle weakness (generalized): Secondary | ICD-10-CM | POA: Diagnosis not present

## 2020-02-25 DIAGNOSIS — R29898 Other symptoms and signs involving the musculoskeletal system: Secondary | ICD-10-CM

## 2020-02-25 DIAGNOSIS — R600 Localized edema: Secondary | ICD-10-CM

## 2020-02-25 DIAGNOSIS — M25512 Pain in left shoulder: Secondary | ICD-10-CM

## 2020-02-25 NOTE — Patient Instructions (Signed)
Access Code: [email or web address] URL: [email or web address] Date: 02/25/2020 Prepared by: Margretta Ditty  Exercises Supine Shoulder Flexion Extension AAROM with Dowel - 2 x daily - 7 x weekly - 1 sets - 10 reps Standing Isometric Shoulder Abduction with Doorway - Arm Bent - 2 x daily - 7 x weekly - 1 sets - 5 reps - 3-5 seconds hold Standing Scapular Retraction - 2 x daily - 7 x weekly - 1 sets - 10 reps Standing Elbow Flexion Extension AROM - 2 x daily - 7 x weekly - 1 sets - 10 reps

## 2020-02-25 NOTE — Therapy (Signed)
[facility] [place] [address]  [address] [place], [place], [address] Phone: [phone]   Fax:  [phone]  Physical Therapy Evaluation  Patient Details  Name: Lori Orozco MRN: [id number] Date of Birth: 02/22/1951 Referring Provider (PT): [name], MD   Encounter Date: 02/25/2020   PT End of Session - 02/25/20 1458    Visit Number 1    Number of Visits 12    Date for PT Re-Evaluation 04/07/20    PT Start Time 1445    PT Stop Time 1538    PT Time Calculation (min) 53 min           Past Medical History:  Diagnosis Date  . Amaurosis fugax   . Anemia    hx  . Anxiety   . Arthritis   . Asthma   . Cataract   . Chronic fatigue syndrome   . Chronic kidney disease    frequency, nephritis when 69 yrs old  . COPD (chronic obstructive pulmonary disease) (HCC)   . COVID-19   . Depression   . Diverticulitis   . Emphysema of lung (HCC)   . Fibromyalgia   . GERD (gastroesophageal reflux disease)    occ  . H/O hiatal hernia   . History of bronchitis   . Hyperlipidemia   . Hypothyroidism   . Interstitial cystitis   . Irritable bowel syndrome   . Lichen sclerosus   . Lumbar herniated disc   . Migraines   . Pneumonia   . PONV (postoperative nausea and vomiting)   . Shortness of breath    exertion   . Thyroid disease    Graves  . Urinary frequency   . Urinary tract infection   . Vertigo     Past Surgical History:  Procedure Laterality Date  . ABDOMINAL HYSTERECTOMY    . bladder sugery     . CAROTID DOPPLAR    . COLONOSCOPY  05/26/2010   avms- otherwise nl , re check 10y  . DEXA-OSTEOPENIA    . DOPPLER ECHOCARDIOGRAPHY    . elbow surgery    . EPICONDYLITIS    . EYE SURGERY Bilateral    cataracts  . FOOT SURGERY Bilateral   . KNEE SURGERY     Left cartilage  . lipoma in second finger right hand    . PLANTAR FASCIA SURGERY Left   . REVERSE SHOULDER ARTHROPLASTY Left 02/02/2020   Procedure: LEFT  REVERSE SHOULDER ARTHROPLASTY;  Surgeon: [name], MD;  Location: [facility] OR;  Service: Orthopedics;  Laterality: Left;  . ROTATOR CUFF REPAIR Left 12/2017  . TEMPOROMANDIBULAR JOINT SURGERY    . TONSILLECTOMY    . TOTAL HIP ARTHROPLASTY Right 03/20/2013   Procedure: Right TOTAL HIP ARTHROPLASTY;  Surgeon: [name], MD;  Location: [facility] OR;  Service: Orthopedics;  Laterality: Right;  Right Total Hip Arthroplasty  . TOTAL KNEE ARTHROPLASTY Left 01/28/2015   Procedure: LEFT TOTAL KNEE ARTHROPLASTY;  Surgeon: [name], MD;  Location: [facility] ORS;  Service: Orthopedics;  Laterality: Left;  . TRIGGER FINGER RELEASE Right   . TROCHANTERIC BURSA EXCISION Right 05/03/2016   Dr. [name], [name]  . WRIST ARTHROSCOPY Right    ligament tear    There were no vitals filed for this visit.    Subjective Assessment - 02/25/20 1452    Subjective The patient reports 3 year h/o L shoulder pain and needed cortisone injections.  She  underwent her third surgery on 02/02/20 with a reverse  total shoulder replacement.  The patient is using her UE regularly and gets occasional sharp pain in her triceps region.    Pertinent History 2 prior shoulder surgeries (h/o tear), R THR, L TKR, chronic fatigue syndrome, h/o chronic pain in legs/back, h/o depression.    Patient Stated Goals Return to bowling and golfing    Currently in Pain? No/denies    Pain Score --   none at rest; gets sharp shooting pain in triceps region with demonstration of AAROM *has point tenderness over triceps L   Pain Location Shoulder    Pain Orientation Left    Pain Descriptors / Indicators Aching;Sore              [facility] PT Assessment - 02/25/20 1456      Assessment   Medical Diagnosis Reverse total shoulder    Referring Provider (PT) [name], MD    Onset Date/Surgical Date 02/02/20    Hand Dominance Right;Left   predominately R, sports with L, lifting with the L   Next MD Visit 03/17/19    Prior Therapy  prior to surgery had therapy in past      Precautions   Precautions Shoulder    Type of Shoulder Precautions reverse total shoulder    Shoulder Interventions --   using passive motion machine/ uses ice triceps region   Precaution Comments she reports no lifting over 1 lb; she reports no other restrictions. *acute care notes document no ER >30 and AROM as tolerated; no WB      Restrictions   Weight Bearing Restrictions Yes    LUE Weight Bearing Non weight bearing      Balance Screen   Has the patient fallen in the past 6 months Yes    How many times? 1- after surgery    Has the patient had a decrease in activity level because of a fear of falling?  No    Is the patient reluctant to leave their home because of a fear of falling?  No      Home Environment   Living Environment Private residence    Living Arrangements Spouse/significant other    Type of Home House    Home Access --   uses a lift   Additional Comments had a ball fall years ago, got a lift 2 years ago to negotiate steps      Prior Function   Level of Independence Independent      Observation/Other Assessments   Focus on Therapeutic Outcomes (FOTO)  43% functional status measure      Sensation   Light Touch Appears Intact      Posture/Postural Control   Posture/Postural Control Postural limitations    Postural Limitations Rounded Shoulders;Forward head      ROM / Strength   AROM / PROM / Strength AROM;PROM;Strength      AROM   Overall AROM  Deficits    Overall AROM Comments patient demonstrates fast A/AROM with cane *she has been doing this at home    AROM Assessment Site Shoulder    Right/Left Shoulder Left    Left Shoulder Flexion 145 Degrees   AAROM     PROM   Overall PROM  Deficits    PROM Assessment Site Shoulder    Right/Left Shoulder Left    Left Shoulder External Rotation 20 Degrees   in place of scapula     Strength   Overall Strength Comments lifts against gravity to demonstrate reaching to  shelf, scaption  *  gets pain with pulling pants up  that grabs in posterior aspect of the shoulder      Palpation   Palpation comment tenderness to palpation L triceps                      Objective measurements completed on examination: See above findings.       [facility] Adult PT Treatment/Exercise - 02/25/20 1518      Self-Care   Self-Care Other Self-Care Comments    Other Self-Care Comments  patient is still sleeping in her recliner because of pain when lying supine in bed-- discussed position with towel under distal humerus (notes she tried)      Exercises   Exercises Shoulder;Elbow      Elbow Exercises   Elbow Flexion Limitations Began elbow flexion supine wiht towel under distal humerus to reduce shoulder extension; patient needs cues to move slowly-- she moves fast at times leading to ER of the shoulder.  Therefore performed in standing position to limit ER of arm due to gravity      Shoulder Exercises: Supine   External Rotation PROM;Left;10 reps    External Rotation Limitations in scapular plane with towel under distal humerus    Flexion AAROM    Flexion Limitations PT corrected technique for AAROM flexion with cane-- slower motion and avoid pushing into painful range      Shoulder Exercises: Standing   Retraction Strengthening;Both;10 reps      Shoulder Exercises: Isometric Strengthening   ABduction 3X5"    ABduction Limitations standing in doorway                  PT Education - 02/25/20 2211    Education Details HEP    Person(s) Educated Patient    Methods Explanation;Demonstration;Handout    Comprehension Returned demonstration;Verbalized understanding            PT Short Term Goals - 02/25/20 2225      PT SHORT TERM GOAL #1   Title The patient will be indep with initial HEP.    Time 4    Period Weeks    Target Date 03/26/20      PT SHORT TERM GOAL #2   Title The patient will return to sleeping in her bed demonstrating improved  positioning with L UE.    Time 4    Period Weeks    Target Date 03/26/20      PT SHORT TERM GOAL #3   Title The patient will reduce pain in L triceps region to < or equal to 2/10 throughout day.    Time 4    Period Weeks    Target Date 03/26/20      PT SHORT TERM GOAL #4   Title The patient will improve L shoulder ER AAROM in scapular plane to 30 degrees.    Time 4    Period Weeks    Target Date 03/26/20             PT Long Term Goals - 02/25/20 2231      PT LONG TERM GOAL #1   Title The patient will be indep with progression of HEP.    Time 8    Period Weeks    Target Date 04/25/20      PT LONG TERM GOAL #2   Title The patient will improve functioanl status measure from 43% up to 57%.    Time 8    Period Weeks  Target Date 04/25/20      PT LONG TERM GOAL #3   Title The patient will be able to reach top of head for self care.    Time 8    Period Weeks    Target Date 04/25/20      PT LONG TERM GOAL #4   Title The patient will be able to donn pants and manage clothing w ithout c/o pain.    Baseline notes severe triceps pain with clothing mgmt.    Time 8    Period Weeks    Target Date 04/25/20                  Plan - 02/25/20 2238    Clinical Impression Statement The patient is a 69 yo female presenting to OP physical therapy s/p reverse total shoulder replacement on 02/02/2020.  She presents today performing AAROM in the home with some pain.  PT recommended dec'd speed and a more controlled ROM.  Patient has impairments in AROM, PROM, strength, myofascial tightness, and pain limiting function.  PT to progress to patient tolerance to optimize functional status.    Personal Factors and Comorbidities Comorbidity 3+;Time since onset of injury/illness/exacerbation    Comorbidities chronic fatigue, prior h/o shoulder surgeries, depression    Examination-Activity Limitations Lift;Reach Overhead    Examination-Participation Restrictions Cleaning;Community  Activity;Driving    Stability/Clinical Decision Making Stable/Uncomplicated    Clinical Decision Making Low    Rehab Potential Good    PT Frequency 2x / week    PT Duration 8 weeks    PT Treatment/Interventions ADLs/Self Care Home Management;Therapeutic activities;Therapeutic exercise;Electrical Stimulation;Moist Heat;Cryotherapy;Vasopneumatic Device;Taping;Manual techniques;Patient/family education    PT Next Visit Plan *review ROM restrictions again (avoid IR, shoulder extension, cross body adduction); progress HEP emphasizing A/AROM, scapular retraction and motor control, deltoid isometrics avoiding shoulder extension beyond frontal plane    PT Home Exercise Plan Access Code: [email or web address]    Consulted and Agree with Plan of Care Patient           Patient will benefit from skilled therapeutic intervention in order to improve the following deficits and impairments:  Pain,Decreased activity tolerance,Decreased strength,Decreased range of motion,Increased fascial restricitons,Impaired flexibility,Postural dysfunction,Impaired UE functional use  Visit Diagnosis: Acute pain of left shoulder  Muscle weakness (generalized)  Other symptoms and signs involving the musculoskeletal system  Localized edema     Problem List Patient Active Problem List   Diagnosis Date Noted  . S/P reverse total shoulder arthroplasty, 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  . Lung nodule < 6cm on CT 01/02/2020  . Rash and nonspecific skin eruption 12/17/2018  . Fever 09/30/2018  . Status post arthroscopy of left shoulder 01/16/2018  . Estrogen deficiency 06/28/2017  . Medial epicondylitis, left elbow 04/29/2017  . S/P arthroscopy of left shoulder 02/25/2017  . Impingement syndrome of left shoulder 01/30/2017  . Pedal edema 10/26/2016  . Trochanteric bursitis, right hip 04/10/2016  . Elevated BP without diagnosis of hypertension 09/02/2015  . Urge incontinence 07/27/2015  . Obesity 04/28/2015  . Need for hepatitis C screening  test 04/26/2015  . Screening for HIV (human immunodeficiency virus) 04/26/2015  . Osteoarthritis of left knee 01/28/2015  . Status post total left knee replacement 01/28/2015  . Vitamin D deficiency 04/27/2014  . Seasonal and perennial allergic rhinitis 06/17/2013  . S/P total hip arthroplasty 03/20/2013  . 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 Encounter for Medicare annual wellness exam 11/18/2012  . History of falling 11/18/2012  . Routine  general medical examination at a health care facility 11/10/2012  . 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 Osteopenia 10/19/2011  . Other screening mammogram 10/19/2011  . Hypothyroid 10/19/2011  . ANEMIA 10/11/2009  . PERIPHERAL NEUROPATHY, FEET 08/05/2009  . BACK PAIN 08/05/2009  . HAND PAIN, BILATERAL 08/05/2009  . TREMOR 03/09/2008  . MENOPAUSAL SYNDROME 10/09/2007  . DEPRESSION, MAJOR 05/20/2007  . BIPOLAR AFFECTIVE DISORDER 05/20/2007  . ANXIETY 05/20/2007  . 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 ESOPHAGEAL SPASM 05/20/2007  . HIATAL HERNIA 05/20/2007  . AMAUROSIS FUGAX 05/07/2007  . COPD mixed type (HCC) 03/27/2007  . HYPERCHOLESTEROLEMIA, PURE 12/10/2006  . SYMPTOM, SYNDROME, CHRONIC FATIGUE 12/10/2006    Amirr Achord, PT 02/25/2020, 10:52 PM  [facility] [address] [place] [address] [place], [place], [address] Phone: [phone]   Fax:  [phone]  Name: Lori Orozco MRN: [id number] Date of Birth: 04/27/1950

## 2020-03-01 ENCOUNTER — Other Ambulatory Visit: Payer: Self-pay

## 2020-03-01 ENCOUNTER — Ambulatory Visit (INDEPENDENT_AMBULATORY_CARE_PROVIDER_SITE_OTHER): Payer: Medicare Other | Admitting: Rehabilitative and Restorative Service Providers"

## 2020-03-01 DIAGNOSIS — R29898 Other symptoms and signs involving the musculoskeletal system: Secondary | ICD-10-CM

## 2020-03-01 DIAGNOSIS — M25512 Pain in left shoulder: Secondary | ICD-10-CM

## 2020-03-01 DIAGNOSIS — R600 Localized edema: Secondary | ICD-10-CM

## 2020-03-01 DIAGNOSIS — M6281 Muscle weakness (generalized): Secondary | ICD-10-CM | POA: Diagnosis not present

## 2020-03-01 NOTE — Patient Instructions (Addendum)
  Access Code: [email or web address] URL: [email or web address] Date: 03/01/2020 Prepared by: Margretta Ditty  Program Notes Avoid internal rotation (no tucking shirt in), shoulder extension, and reaching across your body.  Don't stretch into pain.  Let tissue heal.    Avoid lifting more than a pound (your coffee cup).  Do not support your body weight (leaning on table or pressing up from a chair).   Exercises Supine Shoulder Flexion Extension AAROM with Dowel - 2 x daily - 7 x weekly - 1 sets - 10 reps Standing Isometric Shoulder Abduction with Doorway - Arm Bent - 2 x daily - 7 x weekly - 1 sets - 5 reps - 3-5 seconds hold Standing Scapular Retraction - 2 x daily - 7 x weekly - 1 sets - 10 reps Standing Elbow Flexion Extension AROM - 2 x daily - 7 x weekly - 1 sets - 10 reps Circular Shoulder Pendulum with Table Support - 2 x daily - 7 x weekly - 1 sets - 10 reps

## 2020-03-01 NOTE — Therapy (Signed)
[facility] [place] [address] [place] [address] [place], [place], [address] Phone: [phone]   Fax:  [phone]  Physical Therapy Treatment  Patient Details  Name: Lori Orozco MRN: [id number] Date of Birth: [birth year] Referring Provider (PT): [name], MD   Encounter Date: 03/01/2020   PT End of Session - 03/01/20 1225    Visit Number 2    Number of Visits 12    Date for PT Re-Evaluation 04/07/20    PT Start Time 1148    PT Stop Time 1233    PT Time Calculation (min) 45 min    Activity Tolerance Patient tolerated treatment well    Behavior During Therapy [facility] for tasks assessed/performed           Past Medical History:  Diagnosis Date  . Amaurosis fugax   . Anemia    hx  . Anxiety   . Arthritis   . Asthma   . Cataract   . Chronic fatigue syndrome   . Chronic kidney disease    frequency, nephritis when 70 yrs old  . COPD (chronic obstructive pulmonary disease) (HCC)   . COVID-19   . Depression   . Diverticulitis   . Emphysema of lung (HCC)   . Fibromyalgia   . GERD (gastroesophageal reflux disease)    occ  . H/O hiatal hernia   . History of bronchitis   . Hyperlipidemia   . Hypothyroidism   . Interstitial cystitis   . Irritable bowel syndrome   . Lichen sclerosus   . Lumbar herniated disc   . Migraines   . Pneumonia   . PONV (postoperative nausea and vomiting)   . Shortness of breath    exertion   . Thyroid disease    Graves  . Urinary frequency   . Urinary tract infection   . Vertigo     Past Surgical History:  Procedure Laterality Date  . ABDOMINAL HYSTERECTOMY    . bladder sugery     . CAROTID DOPPLAR    . COLONOSCOPY  05/26/2010   avms- otherwise nl , re check 10y  . DEXA-OSTEOPENIA    . DOPPLER ECHOCARDIOGRAPHY    . elbow surgery    . EPICONDYLITIS    . EYE SURGERY Bilateral    cataracts  . FOOT SURGERY Bilateral   . KNEE SURGERY     Left cartilage  . lipoma in second finger  right hand    . PLANTAR FASCIA SURGERY Left   . REVERSE SHOULDER ARTHROPLASTY Left 02/02/2020   Procedure: LEFT REVERSE SHOULDER ARTHROPLASTY;  Surgeon: [name], MD;  Location: [facility] [place] OR;  Service: Orthopedics;  Laterality: Left;  . ROTATOR CUFF REPAIR Left 12/2017  . TEMPOROMANDIBULAR JOINT SURGERY    . TONSILLECTOMY    . TOTAL HIP ARTHROPLASTY Right 03/20/2013   Procedure: Right TOTAL HIP ARTHROPLASTY;  Surgeon: [name], MD;  Location: [facility] OR;  Service: Orthopedics;  Laterality: Right;  Right Total Hip Arthroplasty  . TOTAL KNEE ARTHROPLASTY Left 01/28/2015   Procedure: LEFT TOTAL KNEE ARTHROPLASTY;  Surgeon: [name], MD;  Location: [facility];  Service: Orthopedics;  Laterality: Left;  . TRIGGER FINGER RELEASE Right   . TROCHANTERIC BURSA EXCISION Right 05/03/2016   Dr. [name], [name]  . WRIST ARTHROSCOPY Right    ligament tear    There were no vitals filed for this visit.   Subjective Assessment - 03/01/20 1152    Subjective The patient reports no pain at rest.  She gets some shooting pain down the back of the arm with certain movements.  PT reviewed to avoid IR, horizontal adduction, and ER > 30.  Also reviewed no WB through [facility].    Pertinent History 2 prior shoulder surgeries (h/o tear), R THR, L TKR, chronic fatigue syndrome, h/o chronic pain in legs/back, h/o depression.    Patient Stated Goals Return to bowling and golfing    Currently in Pain? No/denies              [facility] PT Assessment - 03/01/20 1246      Assessment   Medical Diagnosis Reverse total shoulder    Referring Provider (PT) [name], MD    Onset Date/Surgical Date 02/02/20    Hand Dominance Right;Left                         [facility] Adult PT Treatment/Exercise - 03/01/20 1246      Exercises   Exercises Shoulder;Elbow      Elbow Exercises   Elbow Flexion AROM;Left;10 reps    Elbow Flexion Limitations with arm supported by towel in supine (distal  humerus) with demo to keep it at neutral.      Shoulder Exercises: Supine   External Rotation PROM;AAROM;Left;10 reps    External Rotation Limitations in scapular plane with towel under distal humerus to 25 degrees    Flexion AAROM;Left;10 reps    Flexion Limitations with PT assisting performing flexion and scaption (flexion to 90, scaption to 110)      Shoulder Exercises: Prone   Retraction Strengthening;Left;10 reps    Retraction Limitations arm in 90 degree shoulder flexion over edge of mat table performing guided retraction for scapular engagement      Shoulder Exercises: Sidelying   Other Sidelying Exercises Moved into sidelying for parascapular strengthening and patient had significant L triceps pain-- returned to supine      Shoulder Exercises: Pulleys   Flexion 2 minutes    Other Pulley Exercises worked on technique dec'ing ROM when upper trap begins to elevate shoulder      Shoulder Exercises: Isometric Strengthening   Other Isometric Exercises supine wiht towel under distal humerus performing shoulder extension isometrics x 5 seconds x 5 reps      Modalities   Modalities Vasopneumatic      Vasopneumatic   Number Minutes Vasopneumatic  10 minutes    Vasopnuematic Location  Shoulder    Vasopneumatic Pressure Low    Vasopneumatic Temperature  34                  PT Education - 03/01/20 1224    Education Details HEP    Person(s) Educated Patient    Methods Explanation;Demonstration;Handout    Comprehension Verbalized understanding;Returned demonstration            PT Short Term Goals - 02/25/20 2225      PT SHORT TERM GOAL #1   Title The patient will be indep with initial HEP.    Time 4    Period Weeks    Target Date 03/26/20      PT SHORT TERM GOAL #2   Title The patient will return to sleeping in her bed demonstrating improved positioning with L UE.    Time 4    Period Weeks    Target Date 03/26/20      PT SHORT TERM GOAL #3   Title The  patient will reduce pain in L triceps region  to < or equal to 2/10 throughout day.    Time 4    Period Weeks    Target Date 03/26/20      PT SHORT TERM GOAL #4   Title The patient will improve L shoulder ER AAROM in scapular plane to 30 degrees.    Time 4    Period Weeks    Target Date 03/26/20             PT Long Term Goals - 02/25/20 2231      PT LONG TERM GOAL #1   Title The patient will be indep with progression of HEP.    Time 8    Period Weeks    Target Date 04/25/20      PT LONG TERM GOAL #2   Title The patient will improve functioanl status measure from 43% up to 57%.    Time 8    Period Weeks    Target Date 04/25/20      PT LONG TERM GOAL #3   Title The patient will be able to reach top of head for self care.    Time 8    Period Weeks    Target Date 04/25/20      PT LONG TERM GOAL #4   Title The patient will be able to donn pants and manage clothing w ithout c/o pain.    Baseline notes severe triceps pain with clothing mgmt.    Time 8    Period Weeks    Target Date 04/25/20                 Plan - 03/01/20 1225    Clinical Impression Statement The patient has good motion in L shoulder.  She continues with sharp pain in L triceps region with movement that comes and goes.  She also has point tenderness in triceps region for pain and up into axilla (subscapularis region).  PT reviewed reverse total shoulder precautions and discussed need for tissue healing and avoidance of irritating movements.  Plan to progress to patient tolerance.    PT Treatment/Interventions ADLs/Self Care Home Management;Therapeutic activities;Therapeutic exercise;Electrical Stimulation;Moist Heat;Cryotherapy;Vasopneumatic Device;Taping;Manual techniques;Patient/family education    PT Next Visit Plan progress HEP, isometrics for deltoid (submaximal effort), scapular retraction, motor control, A/AROM.    PT Home Exercise Plan Access Code: [email or web address]    Consulted and Agree with Plan of  Care Patient           Patient will benefit from skilled therapeutic intervention in order to improve the following deficits and impairments:     Visit Diagnosis: Acute pain of left shoulder  Muscle weakness (generalized)  Other symptoms and signs involving the musculoskeletal system  Localized edema     Problem List Patient Active Problem List   Diagnosis Date Noted  . S/P reverse total shoulder arthroplasty, 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  . Lung nodule < 6cm on CT 01/02/2020  . Rash and nonspecific skin eruption 12/17/2018  . Fever 09/30/2018  . Status post arthroscopy of left shoulder 01/16/2018  . Estrogen deficiency 06/28/2017  . Medial epicondylitis, left elbow 04/29/2017  . S/P arthroscopy of left shoulder 02/25/2017  . Impingement syndrome of left shoulder 01/30/2017  . Pedal edema 10/26/2016  . Trochanteric bursitis, right hip 04/10/2016  . Elevated BP without diagnosis of hypertension 09/02/2015  . Urge incontinence 07/27/2015  . Obesity 04/28/2015  . Need for hepatitis C screening test 04/26/2015  . Screening for HIV (human immunodeficiency virus) 04/26/2015  . Osteoarthritis of left knee  01/28/2015  . Status post total left knee replacement 01/28/2015  . Vitamin D deficiency 04/27/2014  . Seasonal and perennial allergic rhinitis 06/17/2013  . S/P total hip arthroplasty 03/20/2013  . 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 Encounter for Medicare annual wellness exam 11/18/2012  . History of falling 11/18/2012  . Routine general medical examination at a health care facility 11/10/2012  . 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 Osteopenia 10/19/2011  . Other screening mammogram 10/19/2011  . Hypothyroid 10/19/2011  . ANEMIA 10/11/2009  . PERIPHERAL NEUROPATHY, FEET 08/05/2009  . BACK PAIN 08/05/2009  . HAND PAIN, BILATERAL 08/05/2009  . TREMOR 03/09/2008  . MENOPAUSAL SYNDROME 10/09/2007  . DEPRESSION, MAJOR 05/20/2007  .  BIPOLAR AFFECTIVE DISORDER 05/20/2007  . ANXIETY 05/20/2007  . 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 ESOPHAGEAL SPASM 05/20/2007  . HIATAL HERNIA 05/20/2007  . AMAUROSIS FUGAX 05/07/2007  . COPD mixed type (HCC) 03/27/2007  . HYPERCHOLESTEROLEMIA, PURE 12/10/2006  . SYMPTOM, SYNDROME, CHRONIC FATIGUE 12/10/2006    Raigen Jagielski, PT 03/01/2020, 12:50 PM  [facility] [address] [place] [address] [place], [place], [address] Phone: [phone]   Fax:  [phone]  Name: Lori Orozco MRN: [id number] Date of Birth: [birth year]

## 2020-03-04 ENCOUNTER — Ambulatory Visit (INDEPENDENT_AMBULATORY_CARE_PROVIDER_SITE_OTHER): Payer: Medicare Other | Admitting: Rehabilitative and Restorative Service Providers"

## 2020-03-04 ENCOUNTER — Other Ambulatory Visit: Payer: Self-pay

## 2020-03-04 DIAGNOSIS — R600 Localized edema: Secondary | ICD-10-CM

## 2020-03-04 DIAGNOSIS — M6281 Muscle weakness (generalized): Secondary | ICD-10-CM | POA: Diagnosis not present

## 2020-03-04 DIAGNOSIS — R29898 Other symptoms and signs involving the musculoskeletal system: Secondary | ICD-10-CM | POA: Diagnosis not present

## 2020-03-04 DIAGNOSIS — M25512 Pain in left shoulder: Secondary | ICD-10-CM | POA: Diagnosis not present

## 2020-03-04 NOTE — Patient Instructions (Signed)
Access Code: [email or web address] URL: [email or web address] Date: 03/04/2020 Prepared by: Margretta Ditty  Program Notes Avoid internal rotation (no tucking shirt in), shoulder extension, and reaching across your body.  Don't stretch into pain.  Let tissue heal.    Avoid lifting more than a pound (your coffee cup).  Do not support your body weight (leaning on table or pressing up from a chair).   Exercises Supine Shoulder Flexion Extension AAROM with Dowel - 2 x daily - 7 x weekly - 1 sets - 10 reps Standing Elbow Flexion Extension AROM - 2 x daily - 7 x weekly - 1 sets - 10 reps Circular Shoulder Pendulum with Table Support - 2 x daily - 7 x weekly - 1 sets - 10 reps Seated Shoulder Shrug - 2 x daily - 7 x weekly - 1 sets - 5 reps  Patient Education Scar Massage Kinesiology tape

## 2020-03-04 NOTE — Therapy (Signed)
[facility] [place] [address] [place], [place], [address] Phone: [phone]   Fax:  [phone]  Physical Therapy Treatment  Patient Details  Name: Lori Orozco MRN: [id number] Date of Birth: 1950-05-26 Referring Provider (PT): [name], MD   Encounter Date: 03/04/2020   PT End of Session - 03/04/20 1405    Visit Number 3    Number of Visits 12    Date for PT Re-Evaluation 04/07/20    PT Start Time 1403    PT Stop Time 1445    PT Time Calculation (min) 42 min    Activity Tolerance Patient tolerated treatment well    Behavior During Therapy [facility] for tasks assessed/performed           Past Medical History:  Diagnosis Date  . Amaurosis fugax   . Anemia    hx  . Anxiety   . Arthritis   . Asthma   . Cataract   . Chronic fatigue syndrome   . Chronic kidney disease    frequency, nephritis when 70 yrs old  . COPD (chronic obstructive pulmonary disease) (HCC)   . COVID-19   . Depression   . Diverticulitis   . Emphysema of lung (HCC)   . Fibromyalgia   . GERD (gastroesophageal reflux disease)    occ  . H/O hiatal hernia   . History of bronchitis   . Hyperlipidemia   . Hypothyroidism   . Interstitial cystitis   . Irritable bowel syndrome   . Lichen sclerosus   . Lumbar herniated disc   . Migraines   . Pneumonia   . PONV (postoperative nausea and vomiting)   . Shortness of breath    exertion   . Thyroid disease    [place]  . Urinary frequency   . Urinary tract infection   . Vertigo     Past Surgical History:  Procedure Laterality Date  . ABDOMINAL HYSTERECTOMY    . bladder sugery     . CAROTID DOPPLAR    . COLONOSCOPY  05/26/2010   avms- otherwise nl , re check 10y  . DEXA-OSTEOPENIA    . DOPPLER ECHOCARDIOGRAPHY    . elbow surgery    . EPICONDYLITIS    . EYE SURGERY Bilateral    cataracts  . FOOT SURGERY Bilateral   . KNEE SURGERY     Left cartilage  . lipoma in second finger  right hand    . PLANTAR FASCIA SURGERY Left   . REVERSE SHOULDER ARTHROPLASTY Left 02/02/2020   Procedure: LEFT REVERSE SHOULDER ARTHROPLASTY;  Surgeon: [name], MD;  Location: [facility];  Service: Orthopedics;  Laterality: Left;  . ROTATOR CUFF REPAIR Left 12/2017  . TEMPOROMANDIBULAR JOINT SURGERY    . TONSILLECTOMY    . TOTAL HIP ARTHROPLASTY Right 03/20/2013   Procedure: Right TOTAL HIP ARTHROPLASTY;  Surgeon: [name], MD;  Location: [facility] OR;  Service: Orthopedics;  Laterality: Right;  Right Total Hip Arthroplasty  . TOTAL KNEE ARTHROPLASTY Left 01/28/2015   Procedure: LEFT TOTAL KNEE ARTHROPLASTY;  Surgeon: [name], MD;  Location: [facility] ORS;  Service: Orthopedics;  Laterality: Left;  . TRIGGER FINGER RELEASE Right   . TROCHANTERIC BURSA EXCISION Right 05/03/2016   Dr. [name], [name]  . WRIST ARTHROSCOPY Right    ligament tear    There were no vitals filed for this visit.       [facility] PT Assessment - 03/04/20 1405      Assessment  Medical Diagnosis Reverse total shoulder    Referring Provider (PT) Meredith Pel, MD    Onset Date/Surgical Date 02/02/20    Hand Dominance Right;Left    Next MD Visit 03/17/19    Prior Therapy prior to surgery had therapy in past      Precautions   Precaution Comments she reports no lifting over 1 lb; she reports no other restrictions.   *see in PA note from 12/8No ER past 30 degrees. Focus on gentle PROM for the first 2 weeks following surgery.  No strengthening until ~6 weeks out from procedure                         [facility] Adult PT Treatment/Exercise - 03/04/20 1405      Self-Care   Self-Care Other Self-Care Comments    Other Self-Care Comments  scar tissue mobilization anterior shoulder      Exercises   Exercises Shoulder;Elbow      Shoulder Exercises: Supine   External Rotation PROM    External Rotation Limitations in scapular plane with towel under distal humerus to 20 degrees due to  pain    Flexion AAROM;Left;15 reps    Flexion Limitations wash cloth press;    ABduction AAROM;Left;10 reps    ABduction Limitations scaption      Shoulder Exercises: Seated   Retraction --    Retraction Limitations --    Flexion Strengthening;Left;10 reps;AAROM   2 sets   Flexion Limitations incline flexion AAROM using raised head of mat table to slide (to 50 degrees)      Shoulder Exercises: Standing   Retraction Limitations attempted retration and patient has significant increase in posterior UE pain (triceps region)    Other Standing Exercises elbow flexion in standing leads to increased pain in posterior aspect of arm      Shoulder Exercises: Pulleys   Flexion 2 minutes    ABduction 2 minutes    ABduction Limitations scaption    Other Pulley Exercises *gentle AAROM <90 degrees and with cues to reduce shoulder shrug      Modalities   Modalities Moist Heat      Moist Heat Therapy   Number Minutes Moist Heat 10 Minutes    Moist Heat Location Shoulder   patient does not tolerate vaso well and PT used heat due ot muscular pain posterior aspect of arm.     Manual Therapy   Manual Therapy Soft tissue mobilization;Passive ROM;Taping    Manual therapy comments taping triceps region and along lateral aspect of arm to reduce pain    Soft tissue mobilization STM L triceps *patient significant pain with palpation    Passive ROM scaption, scapular plane ER, flexion                  PT Education - 03/04/20 1451    Education Details Modified HEP    Person(s) Educated Patient    Methods Explanation;Demonstration;Handout    Comprehension Verbalized understanding;Returned demonstration            PT Short Term Goals - 02/25/20 2225      PT SHORT TERM GOAL #1   Title The patient will be indep with initial HEP.    Time 4    Period Weeks    Target Date 03/26/20      PT SHORT TERM GOAL #2   Title The patient will return to sleeping in her bed demonstrating improved  positioning with L UE.  Time 4    Period Weeks    Target Date 03/26/20      PT SHORT TERM GOAL #3   Title The patient will reduce pain in L triceps region to < or equal to 2/10 throughout day.    Time 4    Period Weeks    Target Date 03/26/20      PT SHORT TERM GOAL #4   Title The patient will improve L shoulder ER AAROM in scapular plane to 30 degrees.    Time 4    Period Weeks    Target Date 03/26/20             PT Long Term Goals - 02/25/20 2231      PT LONG TERM GOAL #1   Title The patient will be indep with progression of HEP.    Time 8    Period Weeks    Target Date 04/25/20      PT LONG TERM GOAL #2   Title The patient will improve functioanl status measure from 43% up to 57%.    Time 8    Period Weeks    Target Date 04/25/20      PT LONG TERM GOAL #3   Title The patient will be able to reach top of head for self care.    Time 8    Period Weeks    Target Date 04/25/20      PT LONG TERM GOAL #4   Title The patient will be able to donn pants and manage clothing w ithout c/o pain.    Baseline notes severe triceps pain with clothing mgmt.    Time 8    Period Weeks    Target Date 04/25/20                 Plan - 03/04/20 1453    Clinical Impression Statement The patient is continuing with significant pain in posterior aspect of arm worse with active elbow flexion in standing, palpation to triceps region, and scapular retraction.  The patient has no pain at rest, but she has intermittent episodes of "catching pain" when transitioning standing > supine and repositioning shoulder (kept a towel to prevent shoulder extension).  PT to reach out ot PA for Dr. August Saucer due to pain limiting tolerance to movement and severity of pain.    PT Treatment/Interventions ADLs/Self Care Home Management;Therapeutic activities;Therapeutic exercise;Electrical Stimulation;Moist Heat;Cryotherapy;Vasopneumatic Device;Taping;Manual techniques;Patient/family education    PT Next  Visit Plan continue with PROM (avoid ER >30), AAROM, PROM, movement to tolerance; *did patient feel tape helped with posterior arm pain?    PT Home Exercise Plan Access Code: [email or web address]    Consulted and Agree with Plan of Care Patient           Patient will benefit from skilled therapeutic intervention in order to improve the following deficits and impairments:  Pain,Decreased activity tolerance,Decreased strength,Decreased range of motion,Increased fascial restricitons,Impaired flexibility,Postural dysfunction,Impaired UE functional use  Visit Diagnosis: Acute pain of left shoulder  Muscle weakness (generalized)  Other symptoms and signs involving the musculoskeletal system  Localized edema     Problem List Patient Active Problem List   Diagnosis Date Noted  . S/P reverse total shoulder arthroplasty, 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  . Lung nodule < 6cm on CT 01/02/2020  . Rash and nonspecific skin eruption 12/17/2018  . Fever 09/30/2018  . Status post arthroscopy of left shoulder 01/16/2018  . Estrogen deficiency 06/28/2017  . Medial epicondylitis, left elbow 04/29/2017  . S/P  arthroscopy of left shoulder 02/25/2017  . Impingement syndrome of left shoulder 01/30/2017  . Pedal edema 10/26/2016  . Trochanteric bursitis, right hip 04/10/2016  . Elevated BP without diagnosis of hypertension 09/02/2015  . Urge incontinence 07/27/2015  . Obesity 04/28/2015  . Need for hepatitis C screening test 04/26/2015  . Screening for HIV (human immunodeficiency virus) 04/26/2015  . Osteoarthritis of left knee 01/28/2015  . Status post total left knee replacement 01/28/2015  . Vitamin D deficiency 04/27/2014  . Seasonal and perennial allergic rhinitis 06/17/2013  . S/P total hip arthroplasty 03/20/2013  . 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 Encounter for Medicare annual wellness exam 11/18/2012  . History of falling 11/18/2012  . Routine general medical examination at a health care  facility 11/10/2012  . 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 Osteopenia 10/19/2011  . Other screening mammogram 10/19/2011  . Hypothyroid 10/19/2011  . ANEMIA 10/11/2009  . PERIPHERAL NEUROPATHY, FEET 08/05/2009  . BACK PAIN 08/05/2009  . HAND PAIN, BILATERAL 08/05/2009  . TREMOR 03/09/2008  . MENOPAUSAL SYNDROME 10/09/2007  . DEPRESSION, MAJOR 05/20/2007  . BIPOLAR AFFECTIVE DISORDER 05/20/2007  . ANXIETY 05/20/2007  . 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 ESOPHAGEAL SPASM 05/20/2007  . HIATAL HERNIA 05/20/2007  . AMAUROSIS FUGAX 05/07/2007  . COPD mixed type (HCC) 03/27/2007  . HYPERCHOLESTEROLEMIA, PURE 12/10/2006  . SYMPTOM, SYNDROME, CHRONIC FATIGUE 12/10/2006    Prabhnoor Ellenberger, PT 03/04/2020, 3:09 PM  [facility] [address] [place], [place], [address] Phone: [phone]   Fax:  [phone]  Name: MYONNA CHISOM MRN: [id number] Date of Birth: 09-May-1950

## 2020-03-07 ENCOUNTER — Other Ambulatory Visit: Payer: Self-pay

## 2020-03-07 ENCOUNTER — Ambulatory Visit (INDEPENDENT_AMBULATORY_CARE_PROVIDER_SITE_OTHER): Payer: Medicare Other | Admitting: Rehabilitative and Restorative Service Providers"

## 2020-03-07 DIAGNOSIS — M6281 Muscle weakness (generalized): Secondary | ICD-10-CM | POA: Diagnosis not present

## 2020-03-07 DIAGNOSIS — R600 Localized edema: Secondary | ICD-10-CM | POA: Diagnosis not present

## 2020-03-07 DIAGNOSIS — M25512 Pain in left shoulder: Secondary | ICD-10-CM | POA: Diagnosis not present

## 2020-03-07 DIAGNOSIS — R29898 Other symptoms and signs involving the musculoskeletal system: Secondary | ICD-10-CM

## 2020-03-07 NOTE — Therapy (Signed)
[facility] [place] [address] [place], [place], [address] Phone: [phone]   Fax:  [phone]  Physical Therapy Treatment  Patient Details  Name: Lori Orozco MRN: [id number] Date of Birth: 05-Nov-1950 Referring Provider (PT): [name], MD   Encounter Date: 03/07/2020   PT End of Session - 03/07/20 1513    Visit Number 4    Number of Visits 12    Date for PT Re-Evaluation 04/07/20    PT Start Time 1430    PT Stop Time 1515    PT Time Calculation (min) 45 min    Activity Tolerance Patient tolerated treatment well    Behavior During Therapy [facility] for tasks assessed/performed           Past Medical History:  Diagnosis Date  . Amaurosis fugax   . Anemia    hx  . Anxiety   . Arthritis   . Asthma   . Cataract   . Chronic fatigue syndrome   . Chronic kidney disease    frequency, nephritis when 70 yrs old  . COPD (chronic obstructive pulmonary disease) (HCC)   . COVID-19   . Depression   . Diverticulitis   . Emphysema of lung (HCC)   . Fibromyalgia   . GERD (gastroesophageal reflux disease)    occ  . H/O hiatal hernia   . History of bronchitis   . Hyperlipidemia   . Hypothyroidism   . Interstitial cystitis   . Irritable bowel syndrome   . Lichen sclerosus   . Lumbar herniated disc   . Migraines   . Pneumonia   . PONV (postoperative nausea and vomiting)   . Shortness of breath    exertion   . Thyroid disease    [place]  . Urinary frequency   . Urinary tract infection   . Vertigo     Past Surgical History:  Procedure Laterality Date  . ABDOMINAL HYSTERECTOMY    . bladder sugery     . CAROTID DOPPLAR    . COLONOSCOPY  05/26/2010   avms- otherwise nl , re check 10y  . DEXA-OSTEOPENIA    . DOPPLER ECHOCARDIOGRAPHY    . elbow surgery    . EPICONDYLITIS    . EYE SURGERY Bilateral    cataracts  . FOOT SURGERY Bilateral   . KNEE SURGERY     Left cartilage  . lipoma in second finger  right hand    . PLANTAR FASCIA SURGERY Left   . REVERSE SHOULDER ARTHROPLASTY Left 02/02/2020   Procedure: LEFT REVERSE SHOULDER ARTHROPLASTY;  Surgeon: [name], MD;  Location: [facility] OR;  Service: Orthopedics;  Laterality: Left;  . ROTATOR CUFF REPAIR Left 12/2017  . TEMPOROMANDIBULAR JOINT SURGERY    . TONSILLECTOMY    . TOTAL HIP ARTHROPLASTY Right 03/20/2013   Procedure: Right TOTAL HIP ARTHROPLASTY;  Surgeon: [name], MD;  Location: [facility] OR;  Service: Orthopedics;  Laterality: Right;  Right Total Hip Arthroplasty  . TOTAL KNEE ARTHROPLASTY Left 01/28/2015   Procedure: LEFT TOTAL KNEE ARTHROPLASTY;  Surgeon: [name], MD;  Location: [facility] OR[facility];  Service: Orthopedics;  Laterality: Left;  . TRIGGER FINGER RELEASE Right   . TROCHANTERIC BURSA EXCISION Right 05/03/2016   Dr. [name], [name]  . WRIST ARTHROSCOPY Right    ligament tear    There were no vitals filed for this visit.   Subjective Assessment - 03/07/20 1433    Subjective The patient reports she has increased pain  today.  PT asked what is provoking pain.  She tried to reach into the microwave today with the left arm.  PT reviewed that she should not be using the left arm for reaching overhead.  She notes feeling discouraged with progress.  PT discussed that she is trying to do items around the house that she should be avoiding and this is contributing to pain.    Pertinent History 2 prior shoulder surgeries (h/o tear), R THR, L TKR, chronic fatigue syndrome, h/o chronic pain in legs/back, h/o depression.    Patient Stated Goals Return to bowling and golfing    Currently in Pain? Yes    Pain Descriptors / Indicators Sore    Pain Type Surgical pain    Pain Onset More than a month ago    Aggravating Factors  using arm    Pain Relieving Factors rest, ice              [facility] PT Assessment - 03/07/20 1436      Assessment   Medical Diagnosis Reverse total shoulder    Referring Provider (PT) [name], MD    Onset Date/Surgical Date 02/02/20    Hand Dominance Right;Left    Next MD Visit 03/17/19                         [facility] Adult PT Treatment/Exercise - 03/07/20 1436      Exercises   Exercises Shoulder;Elbow      Elbow Exercises   Elbow Flexion AROM;Left;10 reps    Elbow Flexion Limitations with arm suppported by towel in plane of scapula to avoid shoulder extension    Elbow Extension AROM;Left;10 reps      Shoulder Exercises: Supine   External Rotation PROM    External Rotation Limitations in scapular plane with towel under distal humerus to 20 degrees due to pain    Flexion AAROM;Left;15 reps    Flexion Limitations with cane    ABduction AAROM;Left;10 reps    ABduction Limitations scaption      Shoulder Exercises: Seated   Flexion Strengthening;Left;10 reps   2 sets   Flexion Limitations incline flexion AAROM using raised head of mat table to slide (to 50 degrees)      Shoulder Exercises: Standing   Internal Rotation AAROM;Left;10 reps    Internal Rotation Limitations IR with cane in small ROM stopping at neutral ( to avoid pushing into ER)    Flexion AAROM;Left;10 reps    Flexion Limitations physioball on mat table to perform AAROM L UE for flexion x 10 reps.  Attempted scaption *this leads to pain in shoulder joint and posterior arm    ABduction AAROM    ABduction Limitations patient demonstrates that she does AAROM with cane in home for abduction (she started this herself after surgery).    Other Standing Exercises shoulder shrugs x 10 reps    Other Standing Exercises standing elbow flexion x with palms up is painful-- did not continue      Shoulder Exercises: Pulleys   Flexion 2 minutes    ABduction 2 minutes    ABduction Limitations scaption    Other Pulley Exercises *gentle AAROM <90 degrees and with cues to reduce shoulder shrug      Shoulder Exercises: ROM/Strengthening   Pendulum seated pendulum x 2 minutes to relax muscles       Modalities   Modalities Vasopneumatic      Vasopneumatic   Number Minutes Vasopneumatic  10 minutes    Vasopnuematic Location  Shoulder    Vasopneumatic Pressure Low    Vasopneumatic Temperature  34                    PT Short Term Goals - 02/25/20 2225      PT SHORT TERM GOAL #1   Title The patient will be indep with initial HEP.    Time 4    Period Weeks    Target Date 03/26/20      PT SHORT TERM GOAL #2   Title The patient will return to sleeping in her bed demonstrating improved positioning with L UE.    Time 4    Period Weeks    Target Date 03/26/20      PT SHORT TERM GOAL #3   Title The patient will reduce pain in L triceps region to < or equal to 2/10 throughout day.    Time 4    Period Weeks    Target Date 03/26/20      PT SHORT TERM GOAL #4   Title The patient will improve L shoulder ER AAROM in scapular plane to 30 degrees.    Time 4    Period Weeks    Target Date 03/26/20             PT Long Term Goals - 02/25/20 2231      PT LONG TERM GOAL #1   Title The patient will be indep with progression of HEP.    Time 8    Period Weeks    Target Date 04/25/20      PT LONG TERM GOAL #2   Title The patient will improve functioanl status measure from 43% up to 57%.    Time 8    Period Weeks    Target Date 04/25/20      PT LONG TERM GOAL #3   Title The patient will be able to reach top of head for self care.    Time 8    Period Weeks    Target Date 04/25/20      PT LONG TERM GOAL #4   Title The patient will be able to donn pants and manage clothing w ithout c/o pain.    Baseline notes severe triceps pain with clothing mgmt.    Time 8    Period Weeks    Target Date 04/25/20                 Plan - 03/07/20 1514    Clinical Impression Statement The patient and PT worked to avoid pain in triceps region with activities today.  She showed up today noting she tried to reach overhead to open the microwave leading to pain in L shoulder.   PT anticipates that pain may be related to activities in the home, however we have modified to avoid scap retraction, elbow flexion with palm up AAROM standing.  PT will work on scar tissue massage next visit.  Patient will be at 5 weeks post surgery tomorrow.    PT Treatment/Interventions ADLs/Self Care Home Management;Therapeutic activities;Therapeutic exercise;Electrical Stimulation;Moist Heat;Cryotherapy;Vasopneumatic Device;Taping;Manual techniques;Patient/family education    PT Next Visit Plan continue with PROM (avoid ER >30), AAROM, PROM, movement to tolerance;    PT Home Exercise Plan Access Code: [email or web address]           Patient will benefit from skilled therapeutic intervention in order to improve the following deficits and impairments:  Pain,Decreased activity tolerance,Decreased strength,Decreased range of  motion,Increased fascial restricitons,Impaired flexibility,Postural dysfunction,Impaired UE functional use  Visit Diagnosis: Acute pain of left shoulder  Muscle weakness (generalized)  Other symptoms and signs involving the musculoskeletal system  Localized edema     Problem List Patient Active Problem List   Diagnosis Date Noted  . S/P reverse total shoulder arthroplasty, 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  . Lung nodule < 6cm on CT 01/02/2020  . Rash and nonspecific skin eruption 12/17/2018  . Fever 09/30/2018  . Status post arthroscopy of left shoulder 01/16/2018  . Estrogen deficiency 06/28/2017  . Medial epicondylitis, left elbow 04/29/2017  . S/P arthroscopy of left shoulder 02/25/2017  . Impingement syndrome of left shoulder 01/30/2017  . Pedal edema 10/26/2016  . Trochanteric bursitis, right hip 04/10/2016  . Elevated BP without diagnosis of hypertension 09/02/2015  . Urge incontinence 07/27/2015  . Obesity 04/28/2015  . Need for hepatitis C screening test 04/26/2015  . Screening for HIV (human immunodeficiency virus) 04/26/2015  . Osteoarthritis of left knee 01/28/2015  .  Status post total left knee replacement 01/28/2015  . Vitamin D deficiency 04/27/2014  . Seasonal and perennial allergic rhinitis 06/17/2013  . S/P total hip arthroplasty 03/20/2013  . 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 Encounter for Medicare annual wellness exam 11/18/2012  . History of falling 11/18/2012  . Routine general medical examination at a health care facility 11/10/2012  . 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 Osteopenia 10/19/2011  . Other screening mammogram 10/19/2011  . Hypothyroid 10/19/2011  . ANEMIA 10/11/2009  . PERIPHERAL NEUROPATHY, FEET 08/05/2009  . BACK PAIN 08/05/2009  . HAND PAIN, BILATERAL 08/05/2009  . TREMOR 03/09/2008  . MENOPAUSAL SYNDROME 10/09/2007  . DEPRESSION, MAJOR 05/20/2007  . BIPOLAR AFFECTIVE DISORDER 05/20/2007  . ANXIETY 05/20/2007  . 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 ESOPHAGEAL SPASM 05/20/2007  . HIATAL HERNIA 05/20/2007  . AMAUROSIS FUGAX 05/07/2007  . COPD mixed type (HCC) 03/27/2007  . HYPERCHOLESTEROLEMIA, PURE 12/10/2006  . SYMPTOM, SYNDROME, CHRONIC FATIGUE 12/10/2006    Verneta Hamidi, PT 03/07/2020, 3:18 PM  [facility] [address] [place], [place], [address] Phone: [phone]   Fax:  [phone]  Name: Lori Orozco MRN: [id number] Date of Birth: May 22, 1950

## 2020-03-10 ENCOUNTER — Encounter: Payer: Self-pay | Admitting: Physical Therapy

## 2020-03-10 ENCOUNTER — Ambulatory Visit (INDEPENDENT_AMBULATORY_CARE_PROVIDER_SITE_OTHER): Payer: Medicare Other | Admitting: Physical Therapy

## 2020-03-10 ENCOUNTER — Other Ambulatory Visit: Payer: Self-pay

## 2020-03-10 DIAGNOSIS — M25512 Pain in left shoulder: Secondary | ICD-10-CM

## 2020-03-10 DIAGNOSIS — M6281 Muscle weakness (generalized): Secondary | ICD-10-CM

## 2020-03-10 DIAGNOSIS — R600 Localized edema: Secondary | ICD-10-CM | POA: Diagnosis not present

## 2020-03-10 DIAGNOSIS — R29898 Other symptoms and signs involving the musculoskeletal system: Secondary | ICD-10-CM

## 2020-03-10 NOTE — Therapy (Addendum)
[facility] [place] [place], [place], [address] Phone: [phone]   Fax:  [phone]  Physical Therapy Treatment and Discharge Summary  Patient Details  Name: Lori Orozco MRN: [id number] Date of Birth: 1950/04/29 Referring Provider (PT): Meredith Pel, MD   Encounter Date: 03/10/2020   PT End of Session - 03/10/20 1452    Visit Number 5    Number of Visits 12    Date for PT Re-Evaluation 04/07/20    PT Start Time 1448    PT Stop Time 1536    PT Time Calculation (min) 48 min    Activity Tolerance Patient tolerated treatment well    Behavior During Therapy [facility] for tasks assessed/performed           Past Medical History:  Diagnosis Date  . Amaurosis fugax   . Anemia    hx  . Anxiety   . Arthritis   . Asthma   . Cataract   . Chronic fatigue syndrome   . Chronic kidney disease    frequency, nephritis when 70 yrs old  . COPD (chronic obstructive pulmonary disease) ([place])   . COVID-19   . Depression   . Diverticulitis   . Emphysema of lung ([place])   . Fibromyalgia   . GERD (gastroesophageal reflux disease)    occ  . H/O hiatal hernia   . History of bronchitis   . Hyperlipidemia   . Hypothyroidism   . Interstitial cystitis   . Irritable bowel syndrome   . Lichen sclerosus   . Lumbar herniated disc   . Migraines   . Pneumonia   . PONV (postoperative nausea and vomiting)   . Shortness of breath    exertion   . Thyroid disease    [place]  . Urinary frequency   . Urinary tract infection   . Vertigo     Past Surgical History:  Procedure Laterality Date  . ABDOMINAL HYSTERECTOMY    . bladder sugery     . CAROTID DOPPLAR    . COLONOSCOPY  05/26/2010   avms- otherwise nl , re check 10y  . DEXA-OSTEOPENIA    . DOPPLER ECHOCARDIOGRAPHY    . elbow surgery    . EPICONDYLITIS    . EYE SURGERY Bilateral    cataracts  . FOOT SURGERY Bilateral   . KNEE SURGERY     Left cartilage  .  lipoma in second finger right hand    . PLANTAR FASCIA SURGERY Left   . REVERSE SHOULDER ARTHROPLASTY Left 02/02/2020   Procedure: LEFT REVERSE SHOULDER ARTHROPLASTY;  Surgeon: Meredith Pel, MD;  Location: [place];  Service: Orthopedics;  Laterality: Left;  . ROTATOR CUFF REPAIR Left 12/2017  . TEMPOROMANDIBULAR JOINT SURGERY    . TONSILLECTOMY    . TOTAL HIP ARTHROPLASTY Right 03/20/2013   Procedure: Right TOTAL HIP ARTHROPLASTY;  Surgeon: Newt Minion, MD;  Location: [place];  Service: Orthopedics;  Laterality: Right;  Right Total Hip Arthroplasty  . TOTAL KNEE ARTHROPLASTY Left 01/28/2015   Procedure: LEFT TOTAL KNEE ARTHROPLASTY;  Surgeon: Mcarthur Rossetti, MD;  Location: [facility];  Service: Orthopedics;  Laterality: Left;  . TRIGGER FINGER RELEASE Right   . TROCHANTERIC BURSA EXCISION Right 05/03/2016   Dr. Ninfa Linden, [place] Gave  . WRIST ARTHROSCOPY Right    ligament tear    There were no vitals filed for this visit.   Subjective Assessment - 03/10/20 1459    Subjective Pt reports no new  changes since last visit.  Posterior Lt arm is sore (10/10) with any movement outside neutral.    Pertinent History 2 prior shoulder surgeries (h/o tear), R THR, L TKR, chronic fatigue syndrome, h/o chronic pain in legs/back, h/o depression.    Patient Stated Goals Return to bowling and golfing    Currently in Pain? No/denies    Pain Score 0-No pain    Pain Onset More than a month ago              [facility] PT Assessment - 03/10/20 0001      Assessment   Medical Diagnosis Reverse total shoulder    Referring Provider (PT) Meredith Pel, MD    Onset Date/Surgical Date 02/02/20    Hand Dominance Right;Left    Next MD Visit 03/17/19      AROM   Right/Left Shoulder Left    Left Shoulder Flexion 145 Degrees   in supine AAROM with cane          [facility] Adult PT Treatment/Exercise - 03/10/20 0001      Shoulder Exercises: Supine   External Rotation PROM    External Rotation  Limitations in scapular plane with pillow under distal humerus to ~30 degrees    Flexion AAROM;Both;10 reps   within tolerance, with cane   Flexion Limitations cues to slow speed of exercise.    Other Supine Exercises chest press with cane x 10, Lt arm to neutral with pillow to prevent from moving into extension      Shoulder Exercises: Seated   Flexion Strengthening;Left;10 reps   2 sets   Flexion Limitations incline flexion AAROM using raised head of mat table to slide (to 50 degrees). cues to slow speed of exercise.    Other Seated Exercises scap retraction x 5 sec x 5 reps (without movement of UE, just scapula- improved tolerance)    Other Seated Exercises shoulder circles forward/ backward x 5 reps each      Shoulder Exercises: Pulleys   Flexion 2 minutes   to 90 deg   Scaption 2 minutes   to 90 deg     Modalities   Modalities Moist Heat;Vasopneumatic      Moist Heat Therapy   Number Minutes Moist Heat 10 Minutes    Moist Heat Location Cervical      Vasopneumatic   Number Minutes Vasopneumatic  10 minutes    Vasopnuematic Location  Shoulder    Vasopneumatic Pressure Low    Vasopneumatic Temperature  38 deg      Manual Therapy   Manual therapy comments I strip of sensitive skin Rock tape applied in zigzag pattern to Lt ant incision to assist with scar management; I strip applied from Lt tricep origin to mid tricep with 20% stretch, perpendicular strip applied with 20% stretch to tricep origin to decompress tissue and improve proprioception    Soft tissue mobilization IASTM / STM to Lt tricep to decrease fascial restriction and pain.  Limited tolerance to temp of [place] gel and tool, despite gentle use.    Passive ROM scaption, scapular plane ER, flexion   to tissue limits and no pain     Neck Exercises: Stretches   Upper Trapezius Stretch Limitations Rt lateral flexion stretch x 15 sec x 2 reps                    PT Short Term Goals - 02/25/20 2225      PT SHORT TERM  GOAL #1  Title The patient will be indep with initial HEP.    Time 4    Period Weeks    Target Date 03/26/20      PT SHORT TERM GOAL #2   Title The patient will return to sleeping in her bed demonstrating improved positioning with L UE.    Time 4    Period Weeks    Target Date 03/26/20      PT SHORT TERM GOAL #3   Title The patient will reduce pain in L triceps region to < or equal to 2/10 throughout day.    Time 4    Period Weeks    Target Date 03/26/20      PT SHORT TERM GOAL #4   Title The patient will improve L shoulder ER AAROM in scapular plane to 30 degrees.    Time 4    Period Weeks    Target Date 03/26/20             PT Long Term Goals - 02/25/20 2231      PT LONG TERM GOAL #1   Title The patient will be indep with progression of HEP.    Time 8    Period Weeks    Target Date 04/25/20      PT LONG TERM GOAL #2   Title The patient will improve functioanl status measure from 43% up to 57%.    Time 8    Period Weeks    Target Date 04/25/20      PT LONG TERM GOAL #3   Title The patient will be able to reach top of head for self care.    Time 8    Period Weeks    Target Date 04/25/20      PT LONG TERM GOAL #4   Title The patient will be able to donn pants and manage clothing w ithout c/o pain.    Baseline notes severe triceps pain with clothing mgmt.    Time 8    Period Weeks    Target Date 04/25/20                 Plan - 03/10/20 1554    Clinical Impression Statement Lt shoulder ROM exercises performed within tolerance and within protocol restrictions (no ext or cross body movement).  She has limited tolerance for allowing her Lt arm to be in neutral position (supported scaption with pillow); prefers arm abducted to ~80 deg and elbow flexed.  Limited tolerance for the [place] gel (cool temp) and IASTM, but reported some feeling of support with application of ktape to posterior shoulder.  Pt making gradual gains each visit towards therapy goals.    PT  Treatment/Interventions ADLs/Self Care Home Management;Therapeutic activities;Therapeutic exercise;Electrical Stimulation;Moist Heat;Cryotherapy;Vasopneumatic Device;Taping;Manual techniques;Patient/family education    PT Next Visit Plan continue with PROM (avoid ER >30), AAROM, PROM, movement to tolerance;    PT Home Exercise Plan Access Code: [email or web address]           Patient will benefit from skilled therapeutic intervention in order to improve the following deficits and impairments:  Pain,Decreased activity tolerance,Decreased strength,Decreased range of motion,Increased fascial restricitons,Impaired flexibility,Postural dysfunction,Impaired UE functional use  Visit Diagnosis: Acute pain of left shoulder  Muscle weakness (generalized)  Other symptoms and signs involving the musculoskeletal system  Localized edema     Problem List Patient Active Problem List   Diagnosis Date Noted  . S/P reverse total shoulder arthroplasty, 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  . Lung nodule < 6cm on CT  01/02/2020  . Rash and nonspecific skin eruption 12/17/2018  . Fever 09/30/2018  . Status post arthroscopy of left shoulder 01/16/2018  . Estrogen deficiency 06/28/2017  . Medial epicondylitis, left elbow 04/29/2017  . S/P arthroscopy of left shoulder 02/25/2017  . Impingement syndrome of left shoulder 01/30/2017  . Pedal edema 10/26/2016  . Trochanteric bursitis, right hip 04/10/2016  . Elevated BP without diagnosis of hypertension 09/02/2015  . Urge incontinence 07/27/2015  . Obesity 04/28/2015  . Need for hepatitis C screening test 04/26/2015  . Screening for HIV (human immunodeficiency virus) 04/26/2015  . Osteoarthritis of left knee 01/28/2015  . Status post total left knee replacement 01/28/2015  . Vitamin D deficiency 04/27/2014  . Seasonal and perennial allergic rhinitis 06/17/2013  . S/P total hip arthroplasty 03/20/2013  . 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 Encounter for Medicare  annual wellness exam 11/18/2012  . History of falling 11/18/2012  . Routine general medical examination at a health care facility 11/10/2012  . 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 Osteopenia 10/19/2011  . Other screening mammogram 10/19/2011  . Hypothyroid 10/19/2011  . ANEMIA 10/11/2009  . PERIPHERAL NEUROPATHY, FEET 08/05/2009  . BACK PAIN 08/05/2009  . HAND PAIN, BILATERAL 08/05/2009  . TREMOR 03/09/2008  . MENOPAUSAL SYNDROME 10/09/2007  . DEPRESSION, MAJOR 05/20/2007  . BIPOLAR AFFECTIVE DISORDER 05/20/2007  . ANXIETY 05/20/2007  . 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 ESOPHAGEAL SPASM 05/20/2007  . HIATAL HERNIA 05/20/2007  . AMAUROSIS FUGAX 05/07/2007  . COPD mixed type ([place]) 03/27/2007  . HYPERCHOLESTEROLEMIA, PURE 12/10/2006  . SYMPTOM, SYNDROME, CHRONIC FATIGUE 12/10/2006    PHYSICAL THERAPY DISCHARGE SUMMARY  Visits from Start of Care: 5  Current functional level related to goals / functional outcomes: See note above for last known status   Remaining deficits: Increased shoulder pain-- patient held to allow symptoms to settle   Education / Equipment: Home program  Plan:                                                    Patient goals were not met. Patient is being discharged due to not returning since the last visit.  ?????        Thank you for the referral of this patient. Rudell Cobb, MPT [place], [place] 03/10/20 4:02 PM  [facility] [address] [place], [place], [address] Phone: [phone]   Fax:  [phone]  Name: Lori Orozco MRN: [id number] Date of Birth: Jan 12, 1951

## 2020-03-15 ENCOUNTER — Encounter: Payer: Medicare Other | Admitting: Physical Therapy

## 2020-03-16 ENCOUNTER — Ambulatory Visit (INDEPENDENT_AMBULATORY_CARE_PROVIDER_SITE_OTHER): Payer: Medicare Other

## 2020-03-16 ENCOUNTER — Other Ambulatory Visit: Payer: Self-pay

## 2020-03-16 ENCOUNTER — Ambulatory Visit (INDEPENDENT_AMBULATORY_CARE_PROVIDER_SITE_OTHER): Payer: Medicare Other | Admitting: Orthopedic Surgery

## 2020-03-16 DIAGNOSIS — Z96612 Presence of left artificial shoulder joint: Secondary | ICD-10-CM

## 2020-03-16 MED ORDER — DICLOFENAC SODIUM 75 MG PO TBEC: DELAYED_RELEASE_TABLET | ORAL | 0 refills | Status: DC

## 2020-03-18 ENCOUNTER — Encounter: Payer: Medicare Other | Admitting: Rehabilitative and Restorative Service Providers"

## 2020-03-19 ENCOUNTER — Encounter: Payer: Self-pay | Admitting: Orthopedic Surgery

## 2020-03-19 NOTE — Progress Notes (Signed)
Post-Op Visit Note   Patient: Lori Orozco           Date of Birth: 07/01/1950           MRN: [id number] Visit Date: 03/16/2020 PCP: [name], MD   Assessment & Plan:  Chief Complaint:  Chief Complaint  Patient presents with  . Left Shoulder - Routine Post Op   Visit Diagnoses:  1. Status post reverse total replacement of left shoulder     Plan: Jolin is a 70 year old patient 6 weeks out left shoulder reverse shoulder replacement.  She is having some posterior pain mid humerus posteriorly.  No numbness and tingling.  No fevers or chills.  This is tissue pain more than bone pain.  On examination she has 35 degrees of external rotation 90 degrees of abduction and 120 of forward flexion.  Tricep strength is excellent.  Incision is intact.  Radiographs look good.  Impression is soft tissue pain unknown origin posterior left arm.  Does not look radicular in nature.  Radiographs look good.  Rena put her on a Voltaren taper for 3 weeks and then see her back in 4 weeks.  Hold off on therapy for the next 3 weeks as she has excellent range of motion and strength at this time.  Follow-Up Instructions: No follow-ups on file.   Orders:  Orders Placed This Encounter  Procedures  . XR Shoulder Left  . XR Humerus Left   Meds ordered this encounter  Medications  . diclofenac (VOLTAREN) 75 MG EC tablet    Sig: 1 po bid x 10 days then po q d x 10 days then q d prn    Dispense:  60 tablet    Refill:  0    Imaging: No results found.  PMFS History: Patient Active Problem List   Diagnosis Date Noted  . S/P reverse total shoulder arthroplasty, 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  . Lung nodule < 6cm on CT 01/02/2020  . Rash and nonspecific skin eruption 12/17/2018  . Fever 09/30/2018  . Status post arthroscopy of left shoulder 01/16/2018  . Estrogen deficiency 06/28/2017  . Medial epicondylitis, left elbow 04/29/2017  . S/P arthroscopy of left shoulder 02/25/2017  . Impingement syndrome of  left shoulder 01/30/2017  . Pedal edema 10/26/2016  . Trochanteric bursitis, right hip 04/10/2016  . Elevated BP without diagnosis of hypertension 09/02/2015  . Urge incontinence 07/27/2015  . Obesity 04/28/2015  . Need for hepatitis C screening test 04/26/2015  . Screening for HIV (human immunodeficiency virus) 04/26/2015  . Osteoarthritis of left knee 01/28/2015  . Status post total left knee replacement 01/28/2015  . Vitamin D deficiency 04/27/2014  . Seasonal and perennial allergic rhinitis 06/17/2013  . S/P total hip arthroplasty 03/20/2013  . 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 Encounter for Medicare annual wellness exam 11/18/2012  . History of falling 11/18/2012  . Routine general medical examination at a health care facility 11/10/2012  . 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 Osteopenia 10/19/2011  . Other screening mammogram 10/19/2011  . Hypothyroid 10/19/2011  . ANEMIA 10/11/2009  . PERIPHERAL NEUROPATHY, FEET 08/05/2009  . BACK PAIN 08/05/2009  . HAND PAIN, BILATERAL 08/05/2009  . TREMOR 03/09/2008  . MENOPAUSAL SYNDROME 10/09/2007  . DEPRESSION, MAJOR 05/20/2007  . BIPOLAR AFFECTIVE DISORDER 05/20/2007  . ANXIETY 05/20/2007  . 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 ESOPHAGEAL SPASM 05/20/2007  . HIATAL HERNIA 05/20/2007  . AMAUROSIS FUGAX 05/07/2007  .  COPD mixed type ([facility]) 03/27/2007  . HYPERCHOLESTEROLEMIA, PURE 12/10/2006  . SYMPTOM, SYNDROME, CHRONIC FATIGUE 12/10/2006   Past Medical History:  Diagnosis Date  . Amaurosis fugax   . Anemia    hx  . Anxiety   . Arthritis   . Asthma   . Cataract   . Chronic fatigue syndrome   . Chronic kidney disease    frequency, nephritis when 70 yrs old  . COPD (chronic obstructive pulmonary disease) ([facility])   . COVID-19   . Depression   . Diverticulitis   . Emphysema of lung ([facility])   . Fibromyalgia   . GERD (gastroesophageal reflux disease)    occ  . H/O hiatal hernia   .  History of bronchitis   . Hyperlipidemia   . Hypothyroidism   . Interstitial cystitis   . Irritable bowel syndrome   . Lichen sclerosus   . Lumbar herniated disc   . Migraines   . Pneumonia   . PONV (postoperative nausea and vomiting)   . Shortness of breath    exertion   . Thyroid disease    Gra[place]  . Urinary frequency   . Urinary tract infection   . Vertigo     Family History  Problem Relation Age of Onset  . Thyroid disease Mother   . Hypertension Mother   . Kidney disease Mother   . Cancer Father   . Colon cancer Other     Past Surgical History:  Procedure Laterality Date  . ABDOMINAL HYSTERECTOMY    . bladder sugery     . CAROTID DOPPLAR    . COLONOSCOPY  05/26/2010   avms- otherwise nl , re check 10y  . DEXA-OSTEOPENIA    . DOPPLER ECHOCARDIOGRAPHY    . elbow surgery    . EPICONDYLITIS    . EYE SURGERY Bilateral    cataracts  . FOOT SURGERY Bilateral   . KNEE SURGERY     Left cartilage  . lipoma in second finger right hand    . PLANTAR FASCIA SURGERY Left   . REVERSE SHOULDER ARTHROPLASTY Left 02/02/2020   Procedure: LEFT REVERSE SHOULDER ARTHROPLASTY;  Surgeon: [name], MD;  [facility]: [facility];  Service: Orthopedics;  Laterality: Left;  . ROTATOR CUFF REPAIR Left 12/2017  . TEMPOROMANDIBULAR JOINT SURGERY    . TONSILLECTOMY    . TOTAL HIP ARTHROPLASTY Right 03/20/2013   Procedure: Right TOTAL HIP ARTHROPLASTY;  Surgeon: [name], MD;  Location: [facility];  Service: Orthopedics;  Laterality: Right;  Right Total Hip Arthroplasty  . TOTAL KNEE ARTHROPLASTY Left 01/28/2015   Procedure: LEFT TOTAL KNEE ARTHROPLASTY;  Surgeon: [name], MD;  Location: [facility];  Service: Orthopedics;  Laterality: Left;  . TRIGGER FINGER RELEASE Right   . TROCHANTERIC BURSA EXCISION Right 05/03/2016   Dr. [name], [name]  . WRIST ARTHROSCOPY Right    ligament tear   Social History   Occupational History  . Not on file  Tobacco Use  . Smoking  status: Former Smoker    Packs/day: 2.00    Years: 43.00    Pack years: 86.00    Types: Cigarettes    Quit date: 02/26/2013    Years since quitting: 7.0  . Smokeless tobacco: Never Used  . Tobacco comment: quit in 2015  Vaping Use  . Vaping Use: Some days  . Devices: no nicotine in it  Substance and Sexual Activity  . Alcohol use: No    Alcohol/week: 0.0 standard drinks  .  Drug use: No  . Sexual activity: Not Currently

## 2020-04-13 ENCOUNTER — Other Ambulatory Visit: Payer: Self-pay | Admitting: Orthopaedic Surgery

## 2020-04-13 ENCOUNTER — Telehealth: Payer: Self-pay

## 2020-04-13 MED ORDER — HYDROCODONE-ACETAMINOPHEN 5-325 MG PO TABS
1.0000 | ORAL_TABLET | Freq: Four times a day (QID) | ORAL | 0 refills | Status: DC | PRN
Start: 2020-04-13 — End: 2020-08-22

## 2020-04-13 NOTE — Telephone Encounter (Signed)
Patient called she is requesting rx refill for Vicodin call back:[phone]

## 2020-04-15 ENCOUNTER — Ambulatory Visit: Payer: Medicare Other | Admitting: Orthopedic Surgery

## 2020-04-18 ENCOUNTER — Other Ambulatory Visit: Payer: Self-pay

## 2020-04-18 ENCOUNTER — Ambulatory Visit (INDEPENDENT_AMBULATORY_CARE_PROVIDER_SITE_OTHER): Payer: Medicare Other | Admitting: Orthopedic Surgery

## 2020-04-18 DIAGNOSIS — Z96612 Presence of left artificial shoulder joint: Secondary | ICD-10-CM

## 2020-04-19 ENCOUNTER — Encounter: Payer: Self-pay | Admitting: Orthopedic Surgery

## 2020-04-19 NOTE — Progress Notes (Signed)
Post-Op Visit Note   Patient: Lori Orozco           Date of Birth: Nov 02, 1950           MRN: [id number] Visit Date: 04/18/2020 PCP: [name], MD   Assessment & Plan:  Chief Complaint:  Chief Complaint  Patient presents with  . Left Shoulder - Follow-up   Visit Diagnoses:  1. Status post reverse total replacement of left shoulder     Plan: Milisa is now about 6 weeks out left reverse shoulder replacement.  We put her on a Voltaren taper last time for some anterior shoulder pain.  That pain has gotten better.  She is doing her own physical therapy for strengthening.  On exam she is got active range of motion above 90 degrees of abduction and forward flexion.  Strength is improving but is about where I would expect it for a reverse replacement at this time.  Incision is intact.  Plan is to continue her home exercise program.  Not exactly certain what the pain generator was but it seems to have resolved.  No tenderness around the acromion today.  Follow-up as needed.  Follow-Up Instructions: No follow-ups on file.   Orders:  No orders of the defined types were placed in this encounter.  No orders of the defined types were placed in this encounter.   Imaging: No results found.  PMFS History: Patient Active Problem List   Diagnosis Date Noted  . S/P reverse total shoulder arthroplasty, 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  . Lung nodule < 6cm on CT 01/02/2020  . Rash and nonspecific skin eruption 12/17/2018  . Fever 09/30/2018  . Status post arthroscopy of left shoulder 01/16/2018  . Estrogen deficiency 06/28/2017  . Medial epicondylitis, left elbow 04/29/2017  . S/P arthroscopy of left shoulder 02/25/2017  . Impingement syndrome of left shoulder 01/30/2017  . Pedal edema 10/26/2016  . Trochanteric bursitis, right hip 04/10/2016  . Elevated BP without diagnosis of hypertension 09/02/2015  . Urge incontinence 07/27/2015  . Obesity 04/28/2015  . Need for hepatitis C screening  test 04/26/2015  . Screening for HIV (human immunodeficiency virus) 04/26/2015  . Osteoarthritis of left knee 01/28/2015  . Status post total left knee replacement 01/28/2015  . Vitamin D deficiency 04/27/2014  . Seasonal and perennial allergic rhinitis 06/17/2013  . S/P total hip arthroplasty 03/20/2013  . 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 Encounter for Medicare annual wellness exam 11/18/2012  . History of falling 11/18/2012  . Routine general medical examination at a health care facility 11/10/2012  . 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 Osteopenia 10/19/2011  . Other screening mammogram 10/19/2011  . Hypothyroid 10/19/2011  . ANEMIA 10/11/2009  . PERIPHERAL NEUROPATHY, FEET 08/05/2009  . BACK PAIN 08/05/2009  . HAND PAIN, BILATERAL 08/05/2009  . TREMOR 03/09/2008  . MENOPAUSAL SYNDROME 10/09/2007  . DEPRESSION, MAJOR 05/20/2007  . BIPOLAR AFFECTIVE DISORDER 05/20/2007  . ANXIETY 05/20/2007  . 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 ESOPHAGEAL SPASM 05/20/2007  . HIATAL HERNIA 05/20/2007  . AMAUROSIS FUGAX 05/07/2007  . COPD mixed type ([facility]) 03/27/2007  . HYPERCHOLESTEROLEMIA, PURE 12/10/2006  . SYMPTOM, SYNDROME, CHRONIC FATIGUE 12/10/2006   Past Medical History:  Diagnosis Date  . Amaurosis fugax   . Anemia    hx  . Anxiety   . Arthritis   . Asthma   . Cataract   . Chronic fatigue syndrome   . Chronic kidney disease  frequency, nephritis when 70 yrs old  . COPD (chronic obstructive pulmonary disease) ([facility])   . COVID-19   . Depression   . Diverticulitis   . Emphysema of lung ([facility])   . Fibromyalgia   . GERD (gastroesophageal reflux disease)    occ  . H/O hiatal hernia   . History of bronchitis   . Hyperlipidemia   . Hypothyroidism   . Interstitial cystitis   . Irritable bowel syndrome   . Lichen sclerosus   . Lumbar herniated disc   . Migraines   . Pneumonia   . PONV (postoperative nausea and vomiting)   .  Shortness of breath    exertion   . Thyroid disease    [place]  . Urinary frequency   . Urinary tract infection   . Vertigo     Family History  Problem Relation Age of Onset  . Thyroid disease Mother   . Hypertension Mother   . Kidney disease Mother   . Cancer Father   . Colon cancer Other     Past Surgical History:  Procedure Laterality Date  . ABDOMINAL HYSTERECTOMY    . bladder sugery     . CAROTID DOPPLAR    . COLONOSCOPY  05/26/2010   avms- otherwise nl , re check 10y  . DEXA-OSTEOPENIA    . DOPPLER ECHOCARDIOGRAPHY    . elbow surgery    . EPICONDYLITIS    . EYE SURGERY Bilateral    cataracts  . FOOT SURGERY Bilateral   . KNEE SURGERY     Left cartilage  . lipoma in second finger right hand    . PLANTAR FASCIA SURGERY Left   . REVERSE SHOULDER ARTHROPLASTY Left 02/02/2020   Procedure: LEFT REVERSE SHOULDER ARTHROPLASTY;  Surgeon: [name], MD;  Location: [facility] OR;  Service: Orthopedics;  Laterality: Left;  . ROTATOR CUFF REPAIR Left 12/2017  . TEMPOROMANDIBULAR JOINT SURGERY    . TONSILLECTOMY    . TOTAL HIP ARTHROPLASTY Right 03/20/2013   Procedure: Right TOTAL HIP ARTHROPLASTY;  Surgeon: [name], MD;  Location: [facility] OR;  Service: Orthopedics;  Laterality: Right;  Right Total Hip Arthroplasty  . TOTAL KNEE ARTHROPLASTY Left 01/28/2015   Procedure: LEFT TOTAL KNEE ARTHROPLASTY;  Surgeon: [name], MD;  Location: [facility];  Service: Orthopedics;  Laterality: Left;  . TRIGGER FINGER RELEASE Right   . TROCHANTERIC BURSA EXCISION Right 05/03/2016   Dr. [name], [name]  . WRIST ARTHROSCOPY Right    ligament tear   Social History   Occupational History  . Not on file  Tobacco Use  . Smoking status: Former Smoker    Packs/day: 2.00    Years: 43.00    Pack years: 86.00    Types: Cigarettes    Quit date: 02/26/2013    Years since quitting: 7.1  . Smokeless tobacco: Never Used  . Tobacco comment: quit in 2015  Vaping Use  . Vaping  Use: Some days  . Devices: no nicotine in it  Substance and Sexual Activity  . Alcohol use: No    Alcohol/week: 0.0 standard drinks  . Drug use: No  . Sexual activity: Not Currently

## 2020-04-29 ENCOUNTER — Other Ambulatory Visit: Payer: Self-pay | Admitting: Family Medicine

## 2020-05-25 ENCOUNTER — Ambulatory Visit (INDEPENDENT_AMBULATORY_CARE_PROVIDER_SITE_OTHER): Payer: Medicare Other

## 2020-05-25 ENCOUNTER — Encounter: Payer: Self-pay | Admitting: Orthopaedic Surgery

## 2020-05-25 ENCOUNTER — Other Ambulatory Visit: Payer: Self-pay

## 2020-05-25 ENCOUNTER — Ambulatory Visit (INDEPENDENT_AMBULATORY_CARE_PROVIDER_SITE_OTHER): Payer: Medicare Other | Admitting: Orthopaedic Surgery

## 2020-05-25 DIAGNOSIS — M545 Low back pain, unspecified: Secondary | ICD-10-CM

## 2020-05-25 DIAGNOSIS — G8929 Other chronic pain: Secondary | ICD-10-CM

## 2020-05-25 NOTE — Progress Notes (Signed)
The patient is well-known to [place].  She is a 70 year old female who comes in with low back pain.  There is no radicular components or sciatic components of her pain.  It is purely in the low back and she points to the midline into the immediate medial lateral sides of the midline at around the mid lumbar to lower lumbar spine.  She has had no change in bowel or bladder function or weakness in her legs.  There is been no numbness and tingling either.  She has tried topical anti-inflammatories and oral anti-inflammatories as well.  She has remote history of epidural steroid injections in the lumbar spine.  There is been no recent imaging.  On my examination she has pain in the lower lumbar spine and mid lumbar spine with flexion extension only.  She has a negative straight leg raise bilaterally and excellent strength in her lower extremities and normal sensation in all dermatomes.  2 views lumbar spine shows generally that her overall alignment is well-maintained.  The lateral view does show some narrowing between L4 and L5 but no malalignment.  There is arthritic changes in the posterior elements at the mid to lower lumbar spine.  Based on my clinical exam I feel that she would best benefit from facet joint injections.  This is also based on her signs and symptoms.  Most likely level should be at L4-L5 but it may end up being L3-L4.  I would like to send her to Dr. Alvester Morin to see if he would provide these injections for her in the facet joints of her back.  He can then get her back to [place] sometime later we will see her in follow-up himself.

## 2020-05-31 ENCOUNTER — Telehealth: Payer: Self-pay | Admitting: Physical Medicine and Rehabilitation

## 2020-05-31 NOTE — Telephone Encounter (Signed)
Called pt and sch 4/19

## 2020-05-31 NOTE — Telephone Encounter (Signed)
Pt called and said someone from Dr.Newtons office called to schedule an appt and she would like a call back.

## 2020-06-04 ENCOUNTER — Encounter: Payer: Self-pay | Admitting: Family Medicine

## 2020-06-14 ENCOUNTER — Other Ambulatory Visit: Payer: Self-pay

## 2020-06-14 ENCOUNTER — Ambulatory Visit (INDEPENDENT_AMBULATORY_CARE_PROVIDER_SITE_OTHER): Payer: Medicare Other | Admitting: Physical Medicine and Rehabilitation

## 2020-06-14 ENCOUNTER — Ambulatory Visit: Payer: Medicare Other

## 2020-06-14 ENCOUNTER — Encounter: Payer: Self-pay | Admitting: Physical Medicine and Rehabilitation

## 2020-06-14 VITALS — BP 172/75 | HR 91

## 2020-06-14 DIAGNOSIS — M47816 Spondylosis without myelopathy or radiculopathy, lumbar region: Secondary | ICD-10-CM | POA: Diagnosis not present

## 2020-06-14 MED ORDER — BETAMETHASONE SOD PHOS & ACET 6 (3-3) MG/ML IJ SUSP
12.0000 mg | Freq: Once | INTRAMUSCULAR | Status: AC
  Administered 2020-06-14: 12 mg

## 2020-06-14 NOTE — Progress Notes (Signed)
Lori Orozco - 70 y.o. female MRN [id number]  Date of birth: [birth year]  Office Visit Note: Visit Date: 06/14/2020 PCP: [name], MD Referred by: Tower, Audrie Gallus, MD  Subjective: Chief Complaint  Patient presents with  . Lower Back - Pain   HPI:  Lori Orozco is a 70 y.o. female who comes in today at the request of Dr. Doneen Poisson for planned Bilateral  L4-L5 and L5-S1 Lumbar facet/medial branch block with fluoroscopic guidance.  The patient has failed conservative care including home exercise, medications, time and activity modification.  This injection will be diagnostic and hopefully therapeutic.  Please see requesting physician notes for further details and justification.  Exam has shown concordant pain with facet joint loading.  Since I last seen the patient she has had a left shoulder replacement by Dr. August Saucer and doing quite well with that.  She continues to strive to stay active with bowling etc.  She does have a lot of axial low back pain worse with standing and movement.  Prior facet blocks for quite a bit in the past.  No red flag complaints.   ROS Otherwise per HPI.  Assessment & Plan: Visit Diagnoses:    ICD-10-CM   1. Spondylosis without myelopathy or radiculopathy, lumbar region  M47.816 XR C-ARM NO REPORT    Facet Injection    betamethasone acetate-betamethasone sodium phosphate (CELESTONE) injection 12 mg    Plan: No additional findings.   Meds & Orders:  Meds ordered this encounter  Medications  . betamethasone acetate-betamethasone sodium phosphate (CELESTONE) injection 12 mg    Orders Placed This Encounter  Procedures  . Facet Injection  . XR C-ARM NO REPORT    Follow-up: Return if symptoms worsen or fail to improve.   Procedures: No procedures performed  Lumbar Diagnostic Facet Joint Nerve Block with Fluoroscopic Guidance   Patient: Lori Orozco      Date of Birth: Nov 18, 1950 MRN: [id number] PCP: [name],  MD      Visit Date: 06/14/2020   Universal Protocol:    Date/Time: 04/19/224:09 PM  Consent Given By: the patient  Position: PRONE  Additional Comments: Vital signs were monitored before and after the procedure. Patient was prepped and draped in the usual sterile fashion. The correct patient, procedure, and site was verified.   Injection Procedure Details:   Procedure diagnoses:  1. Spondylosis without myelopathy or radiculopathy, lumbar region      Meds Administered:  Meds ordered this encounter  Medications  . betamethasone acetate-betamethasone sodium phosphate (CELESTONE) injection 12 mg     Laterality: Bilateral  Location/Site: L4-L5, L3 and L4 medial branches and L5-S1, L4 medial branch and L5 dorsal ramus  Needle: 5.0 in., 25 ga.  Short bevel or Quincke spinal needle  Needle Placement: Oblique pedical  Findings:   -Comments: There was excellent flow of contrast along the articular pillars without intravascular flow.  Procedure Details: The fluoroscope beam is vertically oriented in AP and then obliqued 15 to 20 degrees to the ipsilateral side of the desired nerve to achieve the "Scotty dog" appearance.  The skin over the target area of the junction of the superior articulating process and the transverse process (sacral ala if blocking the L5 dorsal rami) was locally anesthetized with a 1 ml volume of 1% Lidocaine without Epinephrine.  The spinal needle was inserted and advanced in a trajectory view down to the target.   After contact with periosteum and negative aspirate for blood and  CSF, correct placement without intravascular or epidural spread was confirmed by injecting 0.5 ml. of Isovue-250.  A spot radiograph was obtained of this image.    Next, a 0.5 ml. volume of the injectate described above was injected. The needle was then redirected to the other facet joint nerves mentioned above if needed.  Prior to the procedure, the patient was given a Pain Diary  which was completed for baseline measurements.  After the procedure, the patient rated their pain every 30 minutes and will continue rating at this frequency for a total of 5 hours.  The patient has been asked to complete the Diary and return to [place] by mail, fax or hand delivered as soon as possible.   Additional Comments:  The patient tolerated the procedure well Dressing: 2 x 2 sterile gauze and Band-Aid    Post-procedure details: Patient was observed during the procedure. Post-procedure instructions were reviewed.  Patient left the clinic in stable condition.     Clinical History: No specialty comments available.     Objective:  VS:  HT:    WT:   BMI:     BP:(!) 172/75  HR:91bpm  TEMP: ( )  RESP:  Physical Exam Vitals and nursing note reviewed.  Constitutional:      General: She is not in acute distress.    Appearance: Normal appearance. She is obese. She is not ill-appearing.  HENT:     Head: Normocephalic and atraumatic.     Right Ear: External ear normal.     Left Ear: External ear normal.  Eyes:     Extraocular Movements: Extraocular movements intact.  Cardiovascular:     Rate and Rhythm: Normal rate.     Pulses: Normal pulses.  Pulmonary:     Effort: Pulmonary effort is normal. No respiratory distress.  Abdominal:     General: There is no distension.     Palpations: Abdomen is soft.  Musculoskeletal:        General: Tenderness present.     Cervical back: Neck supple.     Right lower leg: No edema.     Left lower leg: No edema.     Comments: Patient has good distal strength with no pain over the greater trochanters.  No clonus or focal weakness. Patient somewhat slow to rise from a seated position to full extension.  There is concordant low back pain with facet loading and lumbar spine extension rotation.  There are no definitive trigger points but the patient is somewhat tender across the lower back and PSIS.  There is no pain with hip rotation.   Skin:     Findings: No erythema, lesion or rash.  Neurological:     General: No focal deficit present.     Mental Status: She is alert and oriented to person, place, and time.     Sensory: No sensory deficit.     Motor: No weakness or abnormal muscle tone.     Coordination: Coordination normal.  Psychiatric:        Mood and Affect: Mood normal.        Behavior: Behavior normal.      Imaging: XR C-ARM NO REPORT  Result Date: 06/14/2020 Please see Notes tab for imaging impression.

## 2020-06-14 NOTE — Procedures (Signed)
Lumbar Diagnostic Facet Joint Nerve Block with Fluoroscopic Guidance   Patient: Lori Orozco      Date of Birth: 1950/06/05 MRN: [id number] PCP: [name], MD      Visit Date: 06/14/2020   Universal Protocol:    Date/Time: 04/19/224:09 PM  Consent Given By: the patient  Position: PRONE  Additional Comments: Vital signs were monitored before and after the procedure. Patient was prepped and draped in the usual sterile fashion. The correct patient, procedure, and site was verified.   Injection Procedure Details:   Procedure diagnoses:  1. Spondylosis without myelopathy or radiculopathy, lumbar region      Meds Administered:  Meds ordered this encounter  Medications  . betamethasone acetate-betamethasone sodium phosphate (CELESTONE) injection 12 mg     Laterality: Bilateral  Location/Site: L4-L5, L3 and L4 medial branches and L5-S1, L4 medial branch and L5 dorsal ramus  Needle: 5.0 in., 25 ga.  Short bevel or Quincke spinal needle  Needle Placement: Oblique pedical  Findings:   -Comments: There was excellent flow of contrast along the articular pillars without intravascular flow.  Procedure Details: The fluoroscope beam is vertically oriented in AP and then obliqued 15 to 20 degrees to the ipsilateral side of the desired nerve to achieve the "Scotty dog" appearance.  The skin over the target area of the junction of the superior articulating process and the transverse process (sacral ala if blocking the L5 dorsal rami) was locally anesthetized with a 1 ml volume of 1% Lidocaine without Epinephrine.  The spinal needle was inserted and advanced in a trajectory view down to the target.   After contact with periosteum and negative aspirate for blood and CSF, correct placement without intravascular or epidural spread was confirmed by injecting 0.5 ml. of Isovue-250.  A spot radiograph was obtained of this image.    Next, a 0.5 ml. volume of the injectate described  above was injected. The needle was then redirected to the other facet joint nerves mentioned above if needed.  Prior to the procedure, the patient was given a Pain Diary which was completed for baseline measurements.  After the procedure, the patient rated their pain every 30 minutes and will continue rating at this frequency for a total of 5 hours.  The patient has been asked to complete the Diary and return to [place] by mail, fax or hand delivered as soon as possible.   Additional Comments:  The patient tolerated the procedure well Dressing: 2 x 2 sterile gauze and Band-Aid    Post-procedure details: Patient was observed during the procedure. Post-procedure instructions were reviewed.  Patient left the clinic in stable condition.

## 2020-06-14 NOTE — Patient Instructions (Signed)

## 2020-06-14 NOTE — Progress Notes (Signed)
Pt state lower back pain. Pt state walking and standing makes the pain worse. Pt state she take over the counter pain meds and use heat and ice to help ease her pain.  Numeric Pain Rating Scale and Functional Assessment Average Pain 10   In the last MONTH (on 0-10 scale) has pain interfered with the following?  1. General activity like being  able to carry out your everyday physical activities such as walking, climbing stairs, carrying groceries, or moving a chair?  Rating(10)   +Driver, -BT, -Dye Allergies.

## 2020-06-15 NOTE — Progress Notes (Signed)
Subjective:    Patient ID: Lori Orozco, female    DOB: 07/30/1950, 70 y.o.   MRN: [id number]  HPI  female former cigarette/ VAPE smoker followed for COPD, complicated by bipolar, GERD,  PFT 10/09/10-  -----------------------------------------------------------------------------   12/17/19- 71 year old female VAPE smoker followed for COPD , Lung Nodules,, nicotine use, Allergic Rhinitis, complicated by bipolar disorder, Hiatal Hernia, Hypothyroid, Peripheral Neuropathy, Covid infection/MAB infusion Jan, 2021,  Flonase, Proair hfa,  Covid vax- 2 Moderna Flu vax- had No acute change. Discussed CT scan. Little cough since she stopped smoking.  Very limited insurance coverage of inhalers. Using only rescue hfa occ.  Discussed CT. CT chest 12/16/19-  IMPRESSION: 1. Stable small RIGHT lower lobe pulmonary nodules over 1 year time consistent benign etiology per Fleischner criteria. 2. Fine micro nodularity in the medial middle lobes consistent with chronic inflammation/infection. No interval change.  06/16/20- 69 year old female former VAPE/ smoker followed for COPD,  Lung Nodules,, nicotine use, Allergic Rhinitis, complicated by Bipolar disorder, Hiatal Hernia, Hypothyroid, Peripheral Neuropathy, Covid infection/MAB infusion Jan, 2021,  -Flonase, Proair hfa, neb, Duoneb,  Covid vax- 3 [place], 1 Phizer Flu vax- had ACT score 12 Denies needing inhalers since she quit smoking. No DOE with ADLs and feels much better since recovery from Covid last year. No routine cough or phlegm and no active concerns.  ROS-see HPI   + = positive Constitutional:   No-   weight loss, night sweats, fevers, chills, fatigue, lassitude. HEENT:   No-  headaches, difficulty swallowing, tooth/dental problems, sore throat,       No-  sneezing, itching, ear ache, nasal congestion, post nasal drip,  CV:  No-   chest pain, orthopnea, PND, swelling in lower extremities, anasarca, dizziness, palpitations Resp:  shortness of breath with exertion or at rest.              No- productive cough,   non-productive cough,  No- coughing up of blood.               No-   change in color of mucus.  No- wheezing.   Skin: No-   rash or lesions. GI:  No-   heartburn, indigestion, abdominal pain, nausea, vomiting,  GU: MS:  + joint pain or swelling.  . Neuro-     nothing unusual Psych:  No- change in mood or affect. depression or +anxiety.  No memory loss.  Objective:   Physical Exam  General- Alert, Oriented, Affect-appropriate, Distress- none acute.   Skin- rash-none, lesions- none, excoriation- none Lymphadenopathy- none Head- atraumatic            Eyes- Gross vision intact, PERRLA, conjunctivae clear secretions            Ears- Hearing, canals-normal            Nose- Clear, no-Septal dev, mucus, polyps, erosion, perforation             Throat- Mallampati III-IV , mucosa clear , drainage- none, tonsils- atrophic Neck- flexible , trachea midline, no stridor , thyroid nl, carotid no bruit Chest - symmetrical excursion , unlabored           Heart/CV- RRR , no murmur , no gallop  , no rub, nl s1 s2                           - JVD- none , edema- none, stasis changes- none, varices- none  Lung- +clear/ diminished , unlabored, cough-none , dullness-none, rub- none           Chest wall-   Abd-  Br/ Gen/ Rectal- Not done, not indicated Extrem- cyanosis- none, clubbing, none, atrophy- none, strength- nl Neuro-

## 2020-06-16 ENCOUNTER — Other Ambulatory Visit: Payer: Self-pay

## 2020-06-16 ENCOUNTER — Encounter: Payer: Self-pay | Admitting: Internal Medicine

## 2020-06-16 ENCOUNTER — Ambulatory Visit (INDEPENDENT_AMBULATORY_CARE_PROVIDER_SITE_OTHER): Payer: Medicare Other | Admitting: Internal Medicine

## 2020-06-16 DIAGNOSIS — G9332 Myalgic encephalomyelitis/chronic fatigue syndrome: Secondary | ICD-10-CM

## 2020-06-16 DIAGNOSIS — J449 Chronic obstructive pulmonary disease, unspecified: Secondary | ICD-10-CM | POA: Diagnosis not present

## 2020-06-16 DIAGNOSIS — R5382 Chronic fatigue, unspecified: Secondary | ICD-10-CM

## 2020-06-16 NOTE — Patient Instructions (Signed)
We can keep current meds on your list, but it sounds as if you are managing very well.  Please call if we can help

## 2020-06-25 ENCOUNTER — Other Ambulatory Visit: Payer: Self-pay | Admitting: Family Medicine

## 2020-07-01 ENCOUNTER — Other Ambulatory Visit: Payer: Self-pay

## 2020-07-01 ENCOUNTER — Ambulatory Visit: Payer: Medicare Other

## 2020-07-03 ENCOUNTER — Other Ambulatory Visit: Payer: Self-pay | Admitting: Family Medicine

## 2020-07-05 NOTE — Telephone Encounter (Signed)
Pharmacy requests refill on: Simvastatin 20 mg   LAST REFILL: 07/03/2019 (Q-30, R-11) LAST OV: 07/03/2019 NEXT OV: 07/08/2020 PHARMACY: [facility]

## 2020-07-08 ENCOUNTER — Encounter: Payer: Self-pay | Admitting: Family Medicine

## 2020-07-08 ENCOUNTER — Other Ambulatory Visit: Payer: Self-pay

## 2020-07-08 ENCOUNTER — Ambulatory Visit (INDEPENDENT_AMBULATORY_CARE_PROVIDER_SITE_OTHER): Payer: Medicare Other | Admitting: Family Medicine

## 2020-07-08 VITALS — BP 140/88 | HR 79 | Temp 97.0°F | Ht 66.0 in | Wt 197.0 lb

## 2020-07-08 DIAGNOSIS — I10 Essential (primary) hypertension: Secondary | ICD-10-CM | POA: Diagnosis not present

## 2020-07-08 DIAGNOSIS — E559 Vitamin D deficiency, unspecified: Secondary | ICD-10-CM

## 2020-07-08 DIAGNOSIS — E7800 Pure hypercholesterolemia, unspecified: Secondary | ICD-10-CM

## 2020-07-08 DIAGNOSIS — Z6831 Body mass index (BMI) 31.0-31.9, adult: Secondary | ICD-10-CM

## 2020-07-08 DIAGNOSIS — Z0000 Encounter for general adult medical examination without abnormal findings: Secondary | ICD-10-CM | POA: Diagnosis not present

## 2020-07-08 DIAGNOSIS — M8580 Other specified disorders of bone density and structure, unspecified site: Secondary | ICD-10-CM

## 2020-07-08 DIAGNOSIS — E039 Hypothyroidism, unspecified: Secondary | ICD-10-CM | POA: Diagnosis not present

## 2020-07-08 DIAGNOSIS — F319 Bipolar disorder, unspecified: Secondary | ICD-10-CM

## 2020-07-08 DIAGNOSIS — E6609 Other obesity due to excess calories: Secondary | ICD-10-CM

## 2020-07-08 LAB — CBC WITH DIFFERENTIAL/PLATELET
Basophils Absolute: 0 10*3/uL (ref 0.0–0.1)
Basophils Relative: 0.4 % (ref 0.0–3.0)
Eosinophils Absolute: 0 10*3/uL (ref 0.0–0.7)
Eosinophils Relative: 0.9 % (ref 0.0–5.0)
HCT: 37.3 % (ref 36.0–46.0)
Hemoglobin: 12.3 g/dL (ref 12.0–15.0)
Lymphocytes Relative: 51.6 % — ABNORMAL HIGH (ref 12.0–46.0)
Lymphs Abs: 2.4 10*3/uL (ref 0.7–4.0)
MCHC: 33 g/dL (ref 30.0–36.0)
MCV: 94.8 fl (ref 78.0–100.0)
Monocytes Absolute: 0.3 10*3/uL (ref 0.1–1.0)
Monocytes Relative: 6 % (ref 3.0–12.0)
Neutro Abs: 1.9 10*3/uL (ref 1.4–7.7)
Neutrophils Relative %: 41.1 % — ABNORMAL LOW (ref 43.0–77.0)
Platelets: 208 10*3/uL (ref 150.0–400.0)
RBC: 3.94 Mil/uL (ref 3.87–5.11)
RDW: 14.9 % (ref 11.5–15.5)
WBC: 4.6 10*3/uL (ref 4.0–10.5)

## 2020-07-08 LAB — LIPID PANEL
Cholesterol: 192 mg/dL (ref 0–200)
HDL: 70.3 mg/dL (ref >39.00)
LDL Cholesterol: 101 mg/dL — ABNORMAL HIGH (ref 0–99)
NonHDL: 121.59
Total CHOL/HDL Ratio: 3
Triglycerides: 101 mg/dL (ref 0.0–149.0)
VLDL: 20.2 mg/dL (ref 0.0–40.0)

## 2020-07-08 LAB — COMPREHENSIVE METABOLIC PANEL
ALT: 8 U/L (ref 0–35)
AST: 14 U/L (ref 0–37)
Albumin: 4.2 g/dL (ref 3.5–5.2)
Alkaline Phosphatase: 62 U/L (ref 39–117)
BUN: 20 mg/dL (ref 6–23)
CO2: 31 mEq/L (ref 19–32)
Calcium: 9.7 mg/dL (ref 8.4–10.5)
Chloride: 103 mEq/L (ref 96–112)
Creatinine, Ser: 1.03 mg/dL (ref 0.40–1.20)
GFR: 55.27 mL/min — ABNORMAL LOW (ref >60.00)
Glucose, Bld: 96 mg/dL (ref 70–99)
Potassium: 4.5 mEq/L (ref 3.5–5.1)
Sodium: 142 mEq/L (ref 135–145)
Total Bilirubin: 0.5 mg/dL (ref 0.2–1.2)
Total Protein: 7.1 g/dL (ref 6.0–8.3)

## 2020-07-08 LAB — VITAMIN D 25 HYDROXY (VIT D DEFICIENCY, FRACTURES): VITD: 52.85 ng/mL (ref 30.00–100.00)

## 2020-07-08 LAB — TSH: TSH: 4.16 u[IU]/mL (ref 0.35–4.50)

## 2020-07-08 MED ORDER — MIRABEGRON ER 25 MG PO TB24
25.0000 mg | ORAL_TABLET | Freq: Every day | ORAL | 11 refills | Status: DC
Start: 2020-07-08 — End: 2021-05-16

## 2020-07-08 MED ORDER — SIMVASTATIN 20 MG PO TABS
20.0000 mg | ORAL_TABLET | Freq: Every day | ORAL | 11 refills | Status: DC
Start: 2020-07-08 — End: 2020-08-02

## 2020-07-08 MED ORDER — LEVOTHYROXINE SODIUM 50 MCG PO TABS
50.0000 ug | ORAL_TABLET | Freq: Every day | ORAL | 11 refills | Status: DC
Start: 2020-07-08 — End: 2020-07-28

## 2020-07-08 NOTE — Patient Instructions (Addendum)
I will order cologuard for colon cancer screening   Mammogram is due in June   If blood pressure runs high- we may need to start a medication  Please check at home  If over 140 on top/ 90 on bottom for more than 3 readings then we need to know  Labs today   Keep working on healthy diet

## 2020-07-08 NOTE — Progress Notes (Signed)
Subjective:    Patient ID: Lori Orozco, female    DOB: October 13, 1950, 70 y.o.   MRN: [id number]  This visit occurred during the SARS-CoV-2 public health emergency.  Safety protocols were in place, including screening questions prior to the visit, additional usage of staff PPE, and extensive cleaning of exam room while observing appropriate contact time as indicated for disinfecting solutions.    HPI Pt presents amw and annual follow up of chronic medical conditions  I have personally reviewed the Medicare Annual Wellness questionnaire and have noted 1. The patient's medical and social history 2. Their use of alcohol, tobacco or illicit drugs 3. Their current medications and supplements 4. The patient's functional ability including ADL's, fall risks, home safety risks and hearing or visual             impairment. 5. Diet and physical activities 6. Evidence for depression or mood disorders  The patients weight, height, BMI have been recorded in the chart and visual acuity is per eye clinic.  I have made referrals, counseling and provided education to the patient based review of the above and I have provided the pt with a written personalized care plan for preventive services. Reviewed and updated provider list, see scanned forms.  See scanned forms.  Routine anticipatory guidance given to patient.  See health maintenance. Colon cancer screening 3/12 colonoscopy Breast cancer screening   Mammogram 6/21 Self breast exam-no lumps  Gyn exam due in july Flu vaccine 9/21 Tetanus vaccine  8/13 Td Pneumovax-completed covid vaccinated Zoster vaccine-had shingrix Dexa 5/19  Osteopenia   Falls- one fall after her shoulder replacement (had to re xray her arm)-nothing broken Fractures- none  Supplements- vit D Exercise - has a hard time with it (does bowl once per week) - a lot of body pain   Advance directive-up to date  Cognitive function addressed- see scanned forms- and if  abnormal then additional documentation follows.   She is her usual self  No changes  Sometimes forgets what she wants to say for a minute Does finances  Not confused not lost   PMH and SH reviewed  Meds, vitals, and allergies reviewed.   ROS: See HPI.  Otherwise negative.    Weight : Wt Readings from Last 3 Encounters:  07/08/20 197 lb (89.4 kg)  06/16/20 198 lb (89.8 kg)  02/17/20 200 lb (90.7 kg)   31.80 kg/m  Doing ok overall  Hearing/vision:  Hearing Screening   125Hz  250Hz  500Hz  1000Hz  2000Hz  3000Hz  4000Hz  6000Hz  8000Hz   Right ear:   40 40 40  0    Left ear:   40 40 40  0    Vision Screening Comments: Eye exam on 07/06/20 with Dr.  Room mate says her hearing is not as good  She had eval a year ago and it was fine   Care team Audre[facility]- pcp - [facility]    bp is elevated mildly BP Readings from Last 3 Encounters:  07/08/20 140/88  06/16/20 (!) 158/84  06/14/20 (!) 172/75   Has h/o white coat htn in office Pulse Readings from Last 3 Encounters:  07/08/20 79  06/16/20 73  06/14/20 91   Copd- doing fairly well  Saw Dr 09/05/20 in april  Hypothyroidism  Pt has no clinical changes No change in energy level/ hair or skin/ edema and no tremor Lab Results  Component Value Date   TSH 2.03 07/03/2019    Due for labs  Levothyroxine  50 mcg daily   Hyperlipidemia Lab Results  Component Value Date   CHOL 203 (H) 07/03/2019   HDL 72.20 07/03/2019   LDLCALC 110 (H) 07/03/2019   TRIG 104.0 07/03/2019   CHOLHDL 3 07/03/2019   Due for labs  Simvastatin 20 mg daily  She has changed eating and cooking- much better  Uses a grill  No fried foods  Does not season with fat  Watches sugar/carbs  Hardest part is eating at night   GERD- thinks she can stop her pepcid   Patient Active Problem List   Diagnosis Date Noted  . S/P reverse total shoulder arthroplasty, 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  . Lung nodule < 6cm on CT 01/02/2020  . Rash and nonspecific  skin eruption 12/17/2018  . Fever 09/30/2018  . Status post arthroscopy of left shoulder 01/16/2018  . Estrogen deficiency 06/28/2017  . Medial epicondylitis, left elbow 04/29/2017  . S/P arthroscopy of left shoulder 02/25/2017  . Impingement syndrome of left shoulder 01/30/2017  . Pedal edema 10/26/2016  . Trochanteric bursitis, right hip 04/10/2016  . Essential hypertension 09/02/2015  . Urge incontinence 07/27/2015  . Obesity 04/28/2015  . Need for hepatitis C screening test 04/26/2015  . Screening for HIV (human immunodeficiency virus) 04/26/2015  . Osteoarthritis of left knee 01/28/2015  . Status post total left knee replacement 01/28/2015  . Vitamin D deficiency 04/27/2014  . Seasonal and perennial allergic rhinitis 06/17/2013  . S/P total hip arthroplasty 03/20/2013  . Medicare annual wellness visit, subsequent 11/18/2012  . History of falling 11/18/2012  . Routine general medical examination at a health care facility 11/10/2012  . 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 Osteopenia 10/19/2011  . Other screening mammogram 10/19/2011  . Hypothyroid 10/19/2011  . ANEMIA 10/11/2009  . PERIPHERAL NEUROPATHY, FEET 08/05/2009  . BACK PAIN 08/05/2009  . HAND PAIN, BILATERAL 08/05/2009  . TREMOR 03/09/2008  . MENOPAUSAL SYNDROME 10/09/2007  . DEPRESSION, MAJOR 05/20/2007  . BIPOLAR AFFECTIVE DISORDER 05/20/2007  . ANXIETY 05/20/2007  . 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 ESOPHAGEAL SPASM 05/20/2007  . HIATAL HERNIA 05/20/2007  . AMAUROSIS FUGAX 05/07/2007  . COPD mixed type (HCC) 03/27/2007  . HYPERCHOLESTEROLEMIA, PURE 12/10/2006  . SYMPTOM, SYNDROME, CHRONIC FATIGUE 12/10/2006   Past Medical History:  Diagnosis Date  . Amaurosis fugax   . Anemia    hx  . Anxiety   . Arthritis   . Asthma   . Cataract   . Chronic fatigue syndrome   . Chronic kidney disease    frequency, nephritis when 70 yrs old  . COPD (chronic obstructive pulmonary disease)  (HCC)   . COVID-19   . Depression   . Diverticulitis   . Emphysema of lung (HCC)   . Fibromyalgia   . GERD (gastroesophageal reflux disease)    occ  . H/O hiatal hernia   . History of bronchitis   . Hyperlipidemia   . Hypothyroidism   . Interstitial cystitis   . Irritable bowel syndrome   . Lichen sclerosus   . Lumbar herniated disc   . Migraines   . Pneumonia   . PONV (postoperative nausea and vomiting)   . Shortness of breath    exertion   . Thyroid disease    [place]  . Urinary frequency   . Urinary tract infection   . Vertigo    Past Surgical History:  Procedure Laterality Date  . ABDOMINAL HYSTERECTOMY    . bladder sugery     . CAROTID  DOPPLAR    . COLONOSCOPY  05/26/2010   avms- otherwise nl , re check 10y  . DEXA-OSTEOPENIA    . DOPPLER ECHOCARDIOGRAPHY    . elbow surgery    . EPICONDYLITIS    . EYE SURGERY Bilateral    cataracts  . FOOT SURGERY Bilateral   . KNEE SURGERY     Left cartilage  . lipoma in second finger right hand    . PLANTAR FASCIA SURGERY Left   . REVERSE SHOULDER ARTHROPLASTY Left 02/02/2020   Procedure: LEFT REVERSE SHOULDER ARTHROPLASTY;  Surgeon: [name], MD;  Location: [facility] OR;  Service: Orthopedics;  Laterality: Left;  . ROTATOR CUFF REPAIR Left 12/2017  . TEMPOROMANDIBULAR JOINT SURGERY    . TONSILLECTOMY    . TOTAL HIP ARTHROPLASTY Right 03/20/2013   Procedure: Right TOTAL HIP ARTHROPLASTY;  Surgeon: [name], MD;  Location: [facility] OR;  Service: Orthopedics;  Laterality: Right;  Right Total Hip Arthroplasty  . TOTAL KNEE ARTHROPLASTY Left 01/28/2015   Procedure: LEFT TOTAL KNEE ARTHROPLASTY;  Surgeon: [name], MD;  Location: [facility] ORS;  Service: Orthopedics;  Laterality: Left;  . TRIGGER FINGER RELEASE Right   . TROCHANTERIC BURSA EXCISION Right 05/03/2016   Dr. [name], [name]  . WRIST ARTHROSCOPY Right    ligament tear   Social History   Tobacco Use  . Smoking status: Former Smoker    Packs/day:  2.00    Years: 43.00    Pack years: 86.00    Types: Cigarettes    Quit date: 02/26/2013    Years since quitting: 7.3  . Smokeless tobacco: Never Used  . Tobacco comment: quit in 2015  Vaping Use  . Vaping Use: Some days  . Devices: no nicotine in it  Substance Use Topics  . Alcohol use: No    Alcohol/week: 0.0 standard drinks  . Drug use: No   Family History  Problem Relation Age of Onset  . Thyroid disease Mother   . Hypertension Mother   . Kidney disease Mother   . Cancer Father   . Colon cancer Other    Allergies  Allergen Reactions  . Lithium Anaphylaxis  . Tegretol [Carbamazepine] Other (See Comments)    Fever and body aches (fever over 103)  . Tricyclic Antidepressants Anaphylaxis  . Strawberry Extract Hives  . Codeine Nausea Only    Makes pt stay awake  . Cymbalta [Duloxetine Hcl] Other (See Comments)    Makes pt pass out   . Erythromycin     abdominal pain  . Lyrica [Pregabalin]     Felt faint  . Neurontin [Gabapentin]     Passes  out  . Rabeprazole Sodium     insomnia  . Duraprep [employer], Misc.] Itching and Rash    Tolerates Betadine   . Penicillins Rash    Tolerated ANCEF 02/02/20  Has patient had a PCN reaction causing immediate rash, facial/tongue/throat swelling, SOB or lightheadedness with hypotension: Yes Has patient had a PCN reaction causing severe rash involving mucus membranes or skin necrosis: No Has patient had a PCN reaction that required hospitalization No Has patient had a PCN reaction occurring within the last 10 years: No If all of the above answers are "NO", then may proceed with Cephalosporin use.   . Tape Rash    PT ALLERGIC NYLON TAPE    Current Outpatient Medications on File Prior to Visit  Medication Sig Dispense Refill  . albuterol (VENTOLIN HFA) 108 (90 Base) MCG/ACT inhaler Inhale  2 puffs into the lungs every 6 (six) hours as needed for wheezing or shortness of breath.    Marland Kitchen buPROPion (WELLBUTRIN XL) 150 MG 24  hr tablet Take 150 mg by mouth daily.    . busPIRone (BUSPAR) 15 MG tablet Take 15 mg by mouth 2 (two) times daily before a meal.    . Cholecalciferol (VITAMIN D3) 2000 UNITS TABS Take 2,000 Units by mouth daily.    . clobetasol cream (TEMOVATE) 0.05 % APPLY SMALL AMOUNT TO AFFECTED AREAS TWO TO THREE TIMES PER WEEK (Patient taking differently: Apply 1 application topically See admin instructions. APPLY SMALL AMOUNT TO AFFECTED AREAS TWO TO THREE TIMES PER WEEK) 30 g 4  . cyanocobalamin 2000 MCG tablet Take 2,000 mcg by mouth daily.    . diphenhydrAMINE (BENADRYL) 25 MG tablet Take 25 mg by mouth every 6 (six) hours as needed for allergies.    Marland Kitchen divalproex (DEPAKOTE ER) 500 MG 24 hr tablet Take 500 mg by mouth at bedtime.     . famotidine (PEPCID) 20 MG tablet TAKE ONE TABLET BY MOUTH TWICE A DAY 60 tablet 0  . fluticasone (FLONASE) 50 MCG/ACT nasal spray PLACE 2 SPRAYS INTO THE NOSE DAILY. (Patient taking differently: Place 2 sprays into both nostrils daily as needed for allergies.) 16 g 11  . HYDROcodone-acetaminophen (NORCO/VICODIN) 5-325 MG tablet Take 1 tablet by mouth every 6 (six) hours as needed for moderate pain. 30 tablet 0  . ipratropium-albuterol (DUONEB) 0.5-2.5 (3) MG/3ML SOLN Take 3 mLs by nebulization every 6 (six) hours as needed. 120 mL prn  . ketotifen (ZADITOR) 0.025 % ophthalmic solution Place 1 drop into both eyes 2 (two) times daily as needed (allergies).    . mirtazapine (REMERON) 30 MG tablet Take 30 mg by mouth at bedtime.     . ondansetron (ZOFRAN ODT) 4 MG disintegrating tablet Take 1 tablet (4 mg total) by mouth every 8 (eight) hours as needed for nausea or vomiting. 20 tablet 0  . PREMARIN 0.625 MG tablet Take 1 tablet (0.625 mg total) by mouth daily. Take one tablet daily (Patient taking differently: Take 0.625 mg by mouth daily.) 30 tablet 12  . sertraline (ZOLOFT) 100 MG tablet Take 100 mg by mouth daily.      No current facility-administered medications on file  prior to visit.    Review of Systems  Constitutional: Positive for fatigue. Negative for activity change, appetite change, fever and unexpected weight change.  HENT: Negative for congestion, ear pain, rhinorrhea, sinus pressure and sore throat.   Eyes: Negative for pain, redness and visual disturbance.  Respiratory: Negative for cough, shortness of breath and wheezing.   Cardiovascular: Negative for chest pain and palpitations.  Gastrointestinal: Negative for abdominal pain, blood in stool, constipation and diarrhea.  Endocrine: Negative for polydipsia and polyuria.  Genitourinary: Negative for dysuria, frequency and urgency.  Musculoskeletal: Positive for arthralgias and myalgias. Negative for back pain.  Skin: Negative for pallor and rash.  Allergic/Immunologic: Negative for environmental allergies.  Neurological: Negative for dizziness, syncope and headaches.  Hematological: Negative for adenopathy. Does not bruise/bleed easily.  Psychiatric/Behavioral: Positive for dysphoric mood. Negative for decreased concentration. The patient is not nervous/anxious.        Objective:   Physical Exam Constitutional:      General: She is not in acute distress.    Appearance: Normal appearance. She is well-developed and normal weight. She is not ill-appearing or diaphoretic.  HENT:     Head: Normocephalic and atraumatic.  Right Ear: Tympanic membrane, ear canal and external ear normal.     Left Ear: Tympanic membrane, ear canal and external ear normal.     Nose: Nose normal. No congestion.     Mouth/Throat:     Mouth: Mucous membranes are moist.     Pharynx: Oropharynx is clear. No posterior oropharyngeal erythema.  Eyes:     General: No scleral icterus.    Extraocular Movements: Extraocular movements intact.     Conjunctiva/sclera: Conjunctivae normal.     Pupils: Pupils are equal, round, and reactive to light.  Neck:     Thyroid: No thyromegaly.     Vascular: No carotid bruit or JVD.   Cardiovascular:     Rate and Rhythm: Normal rate and regular rhythm.     Pulses: Normal pulses.     Heart sounds: Normal heart sounds. No gallop.   Pulmonary:     Effort: Pulmonary effort is normal. No respiratory distress.     Breath sounds: Normal breath sounds. No wheezing.     Comments: Good air exch Chest:     Chest wall: No tenderness.  Abdominal:     General: Bowel sounds are normal. There is no distension or abdominal bruit.     Palpations: Abdomen is soft. There is no mass.     Tenderness: There is no abdominal tenderness.     Hernia: No hernia is present.  Genitourinary:    Comments: Gyn provider does breast and pelvic exam  Musculoskeletal:        General: No tenderness. Normal range of motion.     Cervical back: Normal range of motion and neck supple. No rigidity. No muscular tenderness.     Right lower leg: No edema.     Left lower leg: No edema.  Lymphadenopathy:     Cervical: No cervical adenopathy.  Skin:    General: Skin is warm and dry.     Coloration: Skin is not pale.     Findings: No erythema or rash.     Comments: Solar lentigines diffusely Some sks  Neurological:     Mental Status: She is alert. Mental status is at baseline.     Cranial Nerves: No cranial nerve deficit.     Motor: No abnormal muscle tone.     Coordination: Coordination normal.     Gait: Gait normal.     Deep Tendon Reflexes: Reflexes are normal and symmetric. Reflexes normal.  Psychiatric:        Mood and Affect: Mood normal.        Cognition and Memory: Cognition and memory normal.     Comments: Pleasant            Assessment & Plan:   Problem List Items Addressed This Visit      Cardiovascular and Mediastinum   Essential hypertension    bp has been elevated several visits and she has a h/o white coat HTN BP: 140/88  Discussed poss early HTN (fam hx) Will start checking at home when relaxed and use good technique-given handout  Disc DASH eating  Will start tx if not  under 140 /90      Relevant Medications   simvastatin (ZOCOR) 20 MG tablet   Other Relevant Orders   CBC with Differential/Platelet (Completed)   Comprehensive metabolic panel (Completed)   Lipid panel (Completed)     Endocrine   Hypothyroid    Hypothyroidism  Pt has no clinical changes No change in energy level/ hair or skin/  edema and no tremor TSH today  Takes levothyroxine 50 mcg daily      Relevant Medications   levothyroxine (SYNTHROID) 50 MCG tablet   Other Relevant Orders   TSH (Completed)     Musculoskeletal and Integument   Osteopenia    dexa 5/19  One fall w/o fracture Prior smoker  Taking vit D  Exercise is challenging due to her issues with chronic pain         Other   HYPERCHOLESTEROLEMIA, PURE    Disc goals for lipids and reasons to control them Rev last labs with pt Rev low sat fat diet in detail Labs today  Taking simvastatin 20 mg daily  Better diet recently , commended       Relevant Medications   simvastatin (ZOCOR) 20 MG tablet   Other Relevant Orders   Lipid panel (Completed)   BIPOLAR AFFECTIVE DISORDER    Continues psychiatric care Overall stable       Medicare annual wellness visit, subsequent - Primary    Reviewed health habits including diet and exercise and skin cancer prevention Reviewed appropriate screening tests for age  Also reviewed health mt list, fam hx and immunization status , as well as social and family history   See HPI Labs ordered Colonoscopy was 22 y ago, pt would like to do cologuard for screening now  Mammogram utd 6/21 utd vaccines Hearing screen- loses the highest tone She is not ready for audiology visit  utd vision and eye exam        Vitamin D deficiency    D level today H/o osteopenia  Disc imp to bone and overall health      Relevant Orders   VITAMIN D 25 Hydroxy (Vit-D Deficiency, Fractures) (Completed)   Obesity    Discussed how this problem influences overall health and the risks it  imposes  Reviewed plan for weight loss with lower calorie diet (via better food choices and also portion control or program like weight watchers) and exercise building up to or more than 30 minutes 5 days per week including some aerobic activity

## 2020-07-10 ENCOUNTER — Encounter: Payer: Self-pay | Admitting: Family Medicine

## 2020-07-10 NOTE — Assessment & Plan Note (Signed)
D level today H/o osteopenia  Disc imp to bone and overall health

## 2020-07-10 NOTE — Assessment & Plan Note (Signed)
bp has been elevated several visits and she has a h/o white coat HTN BP: 140/88  Discussed poss early HTN (fam hx) Will start checking at home when relaxed and use good technique-given handout  Disc DASH eating  Will start tx if not under 140 /90

## 2020-07-10 NOTE — Assessment & Plan Note (Signed)
Continues psychiatric care Overall stable

## 2020-07-10 NOTE — Assessment & Plan Note (Signed)
dexa 5/19  One fall w/o fracture Prior smoker  Taking vit D  Exercise is challenging due to her issues with chronic pain

## 2020-07-10 NOTE — Assessment & Plan Note (Signed)
Reviewed health habits including diet and exercise and skin cancer prevention Reviewed appropriate screening tests for age  Also reviewed health mt list, fam hx and immunization status , as well as social and family history   See HPI Labs ordered Colonoscopy was 35 y ago, pt would like to do cologuard for screening now  Mammogram utd 6/21 utd vaccines Hearing screen- loses the highest tone She is not ready for audiology visit  utd vision and eye exam

## 2020-07-10 NOTE — Assessment & Plan Note (Addendum)
Disc goals for lipids and reasons to control them Rev last labs with pt Rev low sat fat diet in detail Labs today  Taking simvastatin 20 mg daily  Better diet recently , commended

## 2020-07-10 NOTE — Assessment & Plan Note (Signed)
Discussed how this problem influences overall health and the risks it imposes  Reviewed plan for weight loss with lower calorie diet (via better food choices and also portion control or program like weight watchers) and exercise building up to or more than 30 minutes 5 days per week including some aerobic activity    

## 2020-07-10 NOTE — Assessment & Plan Note (Signed)
Hypothyroidism  Pt has no clinical changes No change in energy level/ hair or skin/ edema and no tremor TSH today  Takes levothyroxine 50 mcg daily

## 2020-07-12 ENCOUNTER — Other Ambulatory Visit: Payer: Self-pay | Admitting: Family Medicine

## 2020-07-12 ENCOUNTER — Encounter: Payer: Self-pay | Admitting: Gastroenterology

## 2020-07-12 DIAGNOSIS — Z1231 Encounter for screening mammogram for malignant neoplasm of breast: Secondary | ICD-10-CM

## 2020-07-25 LAB — COLOGUARD
COLOGUARD: NEGATIVE
Cologuard: NEGATIVE

## 2020-07-26 ENCOUNTER — Other Ambulatory Visit: Payer: Self-pay | Admitting: Family Medicine

## 2020-07-28 ENCOUNTER — Other Ambulatory Visit: Payer: Self-pay | Admitting: Family Medicine

## 2020-08-01 ENCOUNTER — Other Ambulatory Visit: Payer: Self-pay | Admitting: Family Medicine

## 2020-08-02 ENCOUNTER — Encounter: Payer: Self-pay | Admitting: Family Medicine

## 2020-08-03 MED ORDER — AMLODIPINE BESYLATE 5 MG PO TABS: 5.0000 mg | ORAL_TABLET | Freq: Every day | ORAL | 3 refills | Status: DC

## 2020-08-10 ENCOUNTER — Encounter: Payer: Self-pay | Admitting: Family Medicine

## 2020-08-12 ENCOUNTER — Encounter: Payer: Self-pay | Admitting: Family Medicine

## 2020-08-12 NOTE — Telephone Encounter (Signed)
Pt called triage line and left a VM regarding the same issue that she described in her mychart message. Mychart has been sent to pt's provider.

## 2020-08-18 ENCOUNTER — Other Ambulatory Visit: Payer: Self-pay

## 2020-08-18 ENCOUNTER — Encounter: Payer: Self-pay | Admitting: Physician Assistant

## 2020-08-18 ENCOUNTER — Ambulatory Visit: Payer: Self-pay

## 2020-08-18 ENCOUNTER — Encounter: Payer: Self-pay | Admitting: Family Medicine

## 2020-08-18 ENCOUNTER — Ambulatory Visit (INDEPENDENT_AMBULATORY_CARE_PROVIDER_SITE_OTHER): Payer: Medicare Other | Admitting: Physician Assistant

## 2020-08-18 DIAGNOSIS — M25531 Pain in right wrist: Secondary | ICD-10-CM | POA: Diagnosis not present

## 2020-08-18 MED ORDER — COLCHICINE 0.6 MG PO CAPS: 0.6000 mg | ORAL_CAPSULE | Freq: Every day | ORAL | 2 refills | Status: DC

## 2020-08-18 NOTE — Progress Notes (Signed)
Office Visit Note   Patient: Lori Orozco           Date of Birth: 1950-07-09           MRN: [id number] Visit Date: 08/18/2020              Requested by: Tower, Audrie Gallus, MD [address] [place],  [place] [address] PCP: [name], MD   Assessment & Plan: Visit Diagnoses:  1. Pain in right wrist     Plan: We will place her in a removable Velcro wrist splint.  Start her on colchicine.  I will give her call on 08/19/2020 to check her progress.  Questions were encouraged and answered at length.  Follow-Up Instructions: Return if symptoms worsen or fail to improve.   Orders:  Orders Placed This Encounter  Procedures   XR Wrist Complete Right   Meds ordered this encounter  Medications   Colchicine 0.6 MG CAPS    Sig: Take 0.6 mg by mouth daily. Take one capsule  Every hour for three doses first day then one daily thereafter for one month.    Dispense:  33 capsule    Refill:  2      Procedures: No procedures performed   Clinical Data: No additional findings.   Subjective: Chief Complaint  Patient presents with   Right Wrist - Pain    Pain started in wrist 3 days ago. The next day her wrist and hand were swollen. NKI. Right hand dominant. Hurts to even rest her wrist on a pillow.    HPI Mrs. Moree is well-known to Dr. [name] service comes in today with an acute onset of right wrist pain.  States pain started 3 days ago no injury.  She does note swelling of her hand she cannot bend her fingers or grasp anything.  She is right-hand dominant.  She notes that even with objects touching the hand that she has increased pain.  She denies any history of gout.  Denies any fevers or chills. Review of Systems See HPI  Objective: Vital Signs: There were no vitals taken for this visit.  Physical Exam Constitutional:      Appearance: She is not ill-appearing or diaphoretic.  Neurological:     Mental Status: She is alert.  Psychiatric:        Behavior:  Behavior normal.    Ortho Exam Right wrist and hand she has swelling the dorsal aspect of the right wrist.  Slight warmth.  Minimal erythema.  There is no wound or gross deformity of the right wrist compared to the left otherwise.  She has pain with any manipulation of her fingers or the wrist.  She is unable to fully supinate the right forearm due to pain.  She is nontender over the proximal right forearm and elbow. Specialty Comments:  No specialty comments available.  Imaging: XR Wrist Complete Right  Result Date: 08/18/2020 Right wrist 3 views: No acute fractures or acute findings.  Wrist well located.  Heterotopic bone seen in the ulnar gutter region.  Otherwise no bony abnormalities.    PMFS History: Patient Active Problem List   Diagnosis Date Noted   S/P reverse total shoulder arthroplasty, 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   Lung nodule < 6cm on CT 01/02/2020   Rash and nonspecific skin eruption 12/17/2018   Fever 09/30/2018   Status post arthroscopy of left shoulder 01/16/2018   Estrogen deficiency 06/28/2017   Medial epicondylitis, left elbow 04/29/2017   S/P  arthroscopy of left shoulder 02/25/2017   Impingement syndrome of left shoulder 01/30/2017   Pedal edema 10/26/2016   Trochanteric bursitis, right hip 04/10/2016   Essential hypertension 09/02/2015   Urge incontinence 07/27/2015   Obesity 04/28/2015   Need for hepatitis C screening test 04/26/2015   Screening for HIV (human immunodeficiency virus) 04/26/2015   Osteoarthritis of left knee 01/28/2015   Status post total left knee replacement 01/28/2015   Vitamin D deficiency 04/27/2014   Seasonal and perennial allergic rhinitis 06/17/2013   S/P total hip arthroplasty 03/20/2013   Medicare annual wellness visit, subsequent 11/18/2012   History of falling 11/18/2012   Routine general medical examination at a health care facility 11/10/2012   Lichen sclerosus et atrophicus of the vulva 07/29/2012   Osteopenia 10/19/2011   Other  screening mammogram 10/19/2011   Hypothyroid 10/19/2011   ANEMIA 10/11/2009   PERIPHERAL NEUROPATHY, FEET 08/05/2009   BACK PAIN 08/05/2009   HAND PAIN, BILATERAL 08/05/2009   TREMOR 03/09/2008   MENOPAUSAL SYNDROME 10/09/2007   DEPRESSION, MAJOR 05/20/2007   BIPOLAR AFFECTIVE DISORDER 05/20/2007   ANXIETY 05/20/2007   PERSONALITY DISORDER 05/20/2007   ESOPHAGEAL SPASM 05/20/2007   HIATAL HERNIA 05/20/2007   AMAUROSIS FUGAX 05/07/2007   COPD mixed type (HCC) 03/27/2007   HYPERCHOLESTEROLEMIA, PURE 12/10/2006   SYMPTOM, SYNDROME, CHRONIC FATIGUE 12/10/2006   Past Medical History:  Diagnosis Date   Amaurosis fugax    Anemia    hx   Anxiety    Arthritis    Asthma    Cataract    Chronic fatigue syndrome    Chronic kidney disease    frequency, nephritis when 70 yrs old   COPD (chronic obstructive pulmonary disease) (HCC)    COVID-19    Depression    Diverticulitis    Emphysema of lung (HCC)    Fibromyalgia    GERD (gastroesophageal reflux disease)    occ   H/O hiatal hernia    History of bronchitis    Hyperlipidemia    Hypothyroidism    Interstitial cystitis    Irritable bowel syndrome    Lichen sclerosus    Lumbar herniated disc    Migraines    Pneumonia    PONV (postoperative nausea and vomiting)    Shortness of breath    exertion    Thyroid disease    Graves   Urinary frequency    Urinary tract infection    Vertigo     Family History  Problem Relation Age of Onset   Thyroid disease Mother    Hypertension Mother    Kidney disease Mother    Cancer Father    Colon cancer Other     Past Surgical History:  Procedure Laterality Date   ABDOMINAL HYSTERECTOMY     bladder sugery      CAROTID DOPPLAR     COLONOSCOPY  05/26/2010   [facility]- otherwise nl , re check 10y   DEXA-OSTEOPENIA     DOPPLER ECHOCARDIOGRAPHY     elbow surgery     EPICONDYLITIS     EYE SURGERY Bilateral    cataracts   FOOT SURGERY Bilateral    KNEE SURGERY     Left cartilage    lipoma in second finger right hand     PLANTAR FASCIA SURGERY Left    REVERSE SHOULDER ARTHROPLASTY Left 02/02/2020   Procedure: LEFT REVERSE SHOULDER ARTHROPLASTY;  Surgeon: [name], MD;  Location: [facility];  Service: Orthopedics;  Laterality: Left;   ROTATOR CUFF REPAIR  Left 12/2017   TEMPOROMANDIBULAR JOINT SURGERY     TONSILLECTOMY     TOTAL HIP ARTHROPLASTY Right 03/20/2013   Procedure: Right TOTAL HIP ARTHROPLASTY;  Surgeon: [name], MD;  Location: [facility];  Service: Orthopedics;  Laterality: Right;  Right Total Hip Arthroplasty   TOTAL KNEE ARTHROPLASTY Left 01/28/2015   Procedure: LEFT TOTAL KNEE ARTHROPLASTY;  Surgeon: [name], MD;  Location: [facility];  Service: Orthopedics;  Laterality: Left;   TRIGGER FINGER RELEASE Right    TROCHANTERIC BURSA EXCISION Right 05/03/2016   Dr. Doneen Poisson   WRIST ARTHROSCOPY Right    ligament tear   Social History   Occupational History   Not on file  Tobacco Use   Smoking status: Former    Packs/day: 2.00    Years: 43.00    Pack years: 86.00    Types: Cigarettes    Quit date: 02/26/2013    Years since quitting: 7.4   Smokeless tobacco: Never   Tobacco comments:    quit in 2015  Vaping Use   Vaping Use: Some days   Devices: no nicotine in it  Substance and Sexual Activity   Alcohol use: No    Alcohol/week: 0.0 standard drinks   Drug use: No   Sexual activity: Not Currently

## 2020-08-20 ENCOUNTER — Encounter: Payer: Self-pay | Admitting: Family Medicine

## 2020-08-22 ENCOUNTER — Other Ambulatory Visit: Payer: Self-pay | Admitting: Orthopaedic Surgery

## 2020-08-22 ENCOUNTER — Encounter: Payer: Self-pay | Admitting: Physician Assistant

## 2020-08-22 ENCOUNTER — Ambulatory Visit (INDEPENDENT_AMBULATORY_CARE_PROVIDER_SITE_OTHER): Payer: Medicare Other | Admitting: Physician Assistant

## 2020-08-22 DIAGNOSIS — M25431 Effusion, right wrist: Secondary | ICD-10-CM

## 2020-08-22 DIAGNOSIS — M25531 Pain in right wrist: Secondary | ICD-10-CM | POA: Diagnosis not present

## 2020-08-22 MED ORDER — HYDROCODONE-ACETAMINOPHEN 7.5-325 MG PO TABS
1.0000 | ORAL_TABLET | Freq: Four times a day (QID) | ORAL | 0 refills | Status: DC | PRN
Start: 2020-08-22 — End: 2020-08-25

## 2020-08-22 MED ORDER — LIDOCAINE HCL 1 % IJ SOLN
4.0000 mL | INTRAMUSCULAR | Status: AC | PRN
  Administered 2020-08-22: 16:00:00 4 mL

## 2020-08-22 MED ORDER — METHYLPREDNISOLONE ACETATE 40 MG/ML IJ SUSP
1.0000 mL | INTRAMUSCULAR | Status: AC | PRN
  Administered 2020-08-22: 16:00:00 1 mL via INTRA_ARTICULAR

## 2020-08-22 MED ORDER — PREDNISONE 50 MG PO TABS: 50.0000 mg | ORAL_TABLET | Freq: Every day | ORAL | 0 refills | Status: DC

## 2020-08-22 MED ORDER — DOXYCYCLINE HYCLATE 100 MG PO TABS: 100.0000 mg | ORAL_TABLET | Freq: Two times a day (BID) | ORAL | 0 refills | Status: DC

## 2020-08-22 NOTE — Progress Notes (Signed)
Office Visit Note   Patient: Lori Orozco           Date of Birth: [birth year]           MRN: [id number] Visit Date: 08/22/2020              Requested by: Tower, Lori Gallus, MD [address] [place],  [place] [address] PCP: Lori Pimple, MD   Assessment & Plan: Visit Diagnoses:  1. Pain in right wrist   2. Wrist swelling, right     Plan: Dr. Magnus Orozco was asked to evaluate the patient.  He then discussed with the patient aspiration and possible injection of the wrist.  She was amendable to this.  They have informed the wrist aspiration injection.  Aspiration approximately 3 cc of yellow blood-tinged fluid was obtained.  Then cortisone injection was placed in the right wrist.  She is placed on prednisone 50 mg daily Norco, and 100 mg twice daily.  She will follow-up with Dr. Magnus Orozco this Thursday to see how she is doing.  Aspirate was sent for culture, sensitivity, gram stain and crystals.  Follow-Up Instructions: Return in about 3 days (around 08/25/2020), or Dr. Magnus Orozco.   Orders:  Orders Placed This Encounter  Procedures   Body Fluid Culture   Gram stain   Cell count + diff,  w/ cryst-synvl fld   Meds ordered this encounter  Medications   predniSONE (DELTASONE) 50 MG tablet    Sig: Take 1 tablet (50 mg total) by mouth daily with breakfast.    Dispense:  7 tablet    Refill:  0   doxycycline (VIBRA-TABS) 100 MG tablet    Sig: Take 1 tablet (100 mg total) by mouth 2 (two) times daily for 14 days.    Dispense:  28 tablet    Refill:  0   HYDROcodone-acetaminophen (NORCO) 7.5-325 MG tablet    Sig: Take 1 tablet by mouth every 6 (six) hours as needed for moderate pain.    Dispense:  25 tablet    Refill:  0      Procedures: Medium Joint Inj: R radiocarpal on 08/22/2020 3:53 PM Indications: pain, joint swelling and diagnostic evaluation Details: 25 G 1.5 in needle, dorsal approach Medications: 4 mL lidocaine 1 %; 1 mL methylPREDNISolone acetate 40  MG/ML Aspirate: 3 mL yellow and blood-tinged Consent was given by the patient. Immediately prior to procedure a time out was called to verify the correct patient, procedure, equipment, support staff and site/side marked as required. Patient was prepped and draped in the usual sterile fashion.      Clinical Data: No additional findings.   Subjective: Chief Complaint  Patient presents with   Right Wrist - Pain, Follow-up   Right Hand - Pain, Follow-up    HPI [name] returns today for follow-up of her right wrist pain.  She states that after getting off the phone with me on Friday when she thought that her wrist pain was improving her wrist pain take a turn for the worse.  Again she denies any injury to the wrist.  She is been taking the colchicine without any relief.  She has had no fevers or chills.  Review of Systems  Constitutional:  Negative for chills and fever.    Objective: Vital Signs: There were no vitals taken for this visit.  Physical Exam Constitutional:      Appearance: She is not ill-appearing or diaphoretic.  Neurological:     Mental Status:  She is alert and oriented to person, place, and time.  Psychiatric:        Mood and Affect: Mood normal.    Ortho Exam Right wrist dorsal swelling almost fluidlike stimulation and wrist joint.  No significant erythema.  Slight warmth.  She has difficulty with any passive or active range of motion of the wrist or hand.  She can minimally flex her fingers and extend her fingers with a great deal of pain.  Radial pulses intact.  Specialty Comments:  No specialty comments available.  Imaging: No results found.   PMFS History: Patient Active Problem List   Diagnosis Date Noted   S/P reverse total shoulder arthroplasty, 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   Lung nodule < 6cm on CT 01/02/2020   Rash and nonspecific skin eruption 12/17/2018   Fever 09/30/2018   Status post arthroscopy of left shoulder 01/16/2018   Estrogen deficiency  06/28/2017   Medial epicondylitis, left elbow 04/29/2017   S/P arthroscopy of left shoulder 02/25/2017   Impingement syndrome of left shoulder 01/30/2017   Pedal edema 10/26/2016   Trochanteric bursitis, right hip 04/10/2016   Essential hypertension 09/02/2015   Urge incontinence 07/27/2015   Obesity 04/28/2015   Need for hepatitis C screening test 04/26/2015   Screening for HIV (human immunodeficiency virus) 04/26/2015   Osteoarthritis of left knee 01/28/2015   Status post total left knee replacement 01/28/2015   Vitamin D deficiency 04/27/2014   Seasonal and perennial allergic rhinitis 06/17/2013   S/P total hip arthroplasty 03/20/2013   Medicare annual wellness visit, subsequent 11/18/2012   History of falling 11/18/2012   Routine general medical examination at a health care facility 11/10/2012   Lichen sclerosus et atrophicus of the vulva 07/29/2012   Osteopenia 10/19/2011   Other screening mammogram 10/19/2011   Hypothyroid 10/19/2011   ANEMIA 10/11/2009   PERIPHERAL NEUROPATHY, FEET 08/05/2009   BACK PAIN 08/05/2009   HAND PAIN, BILATERAL 08/05/2009   TREMOR 03/09/2008   MENOPAUSAL SYNDROME 10/09/2007   DEPRESSION, MAJOR 05/20/2007   BIPOLAR AFFECTIVE DISORDER 05/20/2007   ANXIETY 05/20/2007   PERSONALITY DISORDER 05/20/2007   ESOPHAGEAL SPASM 05/20/2007   HIATAL HERNIA 05/20/2007   AMAUROSIS FUGAX 05/07/2007   COPD mixed type (HCC) 03/27/2007   HYPERCHOLESTEROLEMIA, PURE 12/10/2006   SYMPTOM, SYNDROME, CHRONIC FATIGUE 12/10/2006   Past Medical History:  Diagnosis Date   Amaurosis fugax    Anemia    hx   Anxiety    Arthritis    Asthma    Cataract    Chronic fatigue syndrome    Chronic kidney disease    frequency, nephritis when 70 yrs old   COPD (chronic obstructive pulmonary disease) (HCC)    COVID-19    Depression    Diverticulitis    Emphysema of lung (HCC)    Fibromyalgia    GERD (gastroesophageal reflux disease)    occ   H/O hiatal hernia     History of bronchitis    Hyperlipidemia    Hypothyroidism    Interstitial cystitis    Irritable bowel syndrome    Lichen sclerosus    Lumbar herniated disc    Migraines    Pneumonia    PONV (postoperative nausea and vomiting)    Shortness of breath    exertion    Thyroid disease    Graves   Urinary frequency    Urinary tract infection    Vertigo     Family History  Problem Relation Age of Onset   Thyroid disease Mother  Hypertension Mother    Kidney disease Mother    Cancer Father    Colon cancer Other     Past Surgical History:  Procedure Laterality Date   ABDOMINAL HYSTERECTOMY     bladder sugery      CAROTID DOPPLAR     COLONOSCOPY  05/26/2010   [facility]- otherwise nl , re check 10y   DEXA-OSTEOPENIA     DOPPLER ECHOCARDIOGRAPHY     elbow surgery     EPICONDYLITIS     EYE SURGERY Bilateral    cataracts   FOOT SURGERY Bilateral    KNEE SURGERY     Left cartilage   lipoma in second finger right hand     PLANTAR FASCIA SURGERY Left    REVERSE SHOULDER ARTHROPLASTY Left 02/02/2020   Procedure: LEFT REVERSE SHOULDER ARTHROPLASTY;  Surgeon: [name], MD;  Location: [facility];  Service: Orthopedics;  Laterality: Left;   ROTATOR CUFF REPAIR Left 12/2017   TEMPOROMANDIBULAR JOINT SURGERY     TONSILLECTOMY     TOTAL HIP ARTHROPLASTY Right 03/20/2013   Procedure: Right TOTAL HIP ARTHROPLASTY;  Surgeon: [name], MD;  Location: [facility];  Service: Orthopedics;  Laterality: Right;  Right Total Hip Arthroplasty   TOTAL KNEE ARTHROPLASTY Left 01/28/2015   Procedure: LEFT TOTAL KNEE ARTHROPLASTY;  Surgeon: [name], MD;  Location: [facility];  Service: Orthopedics;  Laterality: Left;   TRIGGER FINGER RELEASE Right    TROCHANTERIC BURSA EXCISION Right 05/03/2016   Dr. Doneen Poisson   WRIST ARTHROSCOPY Right    ligament tear   Social History   Occupational History   Not on file  Tobacco Use   Smoking status: Former    Packs/day: 2.00    Years:  43.00    Pack years: 86.00    Types: Cigarettes    Quit date: 02/26/2013    Years since quitting: 7.4   Smokeless tobacco: Never   Tobacco comments:    quit in 2015  Vaping Use   Vaping Use: Some days   Devices: no nicotine in it  Substance and Sexual Activity   Alcohol use: No    Alcohol/week: 0.0 standard drinks   Drug use: No   Sexual activity: Not Currently

## 2020-08-23 LAB — SYNOVIAL FLUID ANALYSIS, COMPLETE

## 2020-08-23 LAB — GRAM STAIN
MICRO NUMBER:: 12057228
SPECIMEN QUALITY:: ADEQUATE

## 2020-08-24 ENCOUNTER — Other Ambulatory Visit: Payer: Self-pay | Admitting: Obstetrics & Gynecology

## 2020-08-24 DIAGNOSIS — R232 Flushing: Secondary | ICD-10-CM

## 2020-08-25 ENCOUNTER — Ambulatory Visit (INDEPENDENT_AMBULATORY_CARE_PROVIDER_SITE_OTHER): Payer: Medicare Other | Admitting: Orthopaedic Surgery

## 2020-08-25 ENCOUNTER — Encounter: Payer: Self-pay | Admitting: Orthopaedic Surgery

## 2020-08-25 DIAGNOSIS — M25531 Pain in right wrist: Secondary | ICD-10-CM | POA: Diagnosis not present

## 2020-08-25 MED ORDER — HYDROCODONE-ACETAMINOPHEN 7.5-325 MG PO TABS: 1.0000 | ORAL_TABLET | Freq: Four times a day (QID) | ORAL | 0 refills | Status: DC | PRN

## 2020-08-25 NOTE — Progress Notes (Signed)
The patient is following up 3 days after we saw her for her right wrist with significant swelling and pain.  I did aspirate fluid off the wrist but we could not get a cell count or crystal analysis because it coagulated.  The white blood cell count was high but there is no organisms.  We have her on prednisone and doxycycline.  Compared to Monday the swelling is much less.  There is no redness.  She still having a lot of pain but overall it looks significantly better to me than it did a few days ago.  This is not appear to be a significant infection and is more of some type of inflammatory process.  She will continue ice and elevation and anti-inflammatories.  I did refill her pain medicine but I want her to continue the prednisone for 2 more days and the antibiotics.  I would like to reevaluate in a week.  Obviously if this is worsening she will let us know.

## 2020-08-26 LAB — SPECIMEN STATUS REPORT

## 2020-08-28 ENCOUNTER — Inpatient Hospital Stay (HOSPITAL_COMMUNITY)
Admission: EM | Admit: 2020-08-28 | Discharge: 2020-09-07 | DRG: 502 | Disposition: A | Discharge status: Skilled Nursing Facility | Payer: Medicare Other | Attending: Internal Medicine | Admitting: Internal Medicine

## 2020-08-28 ENCOUNTER — Other Ambulatory Visit: Payer: Self-pay

## 2020-08-28 ENCOUNTER — Encounter (HOSPITAL_COMMUNITY): Payer: Self-pay

## 2020-08-28 ENCOUNTER — Emergency Department (HOSPITAL_COMMUNITY): Payer: Medicare Other

## 2020-08-28 DIAGNOSIS — Z79899 Other long term (current) drug therapy: Secondary | ICD-10-CM

## 2020-08-28 DIAGNOSIS — Z7952 Long term (current) use of systemic steroids: Secondary | ICD-10-CM

## 2020-08-28 DIAGNOSIS — M797 Fibromyalgia: Secondary | ICD-10-CM | POA: Diagnosis present

## 2020-08-28 DIAGNOSIS — I129 Hypertensive chronic kidney disease with stage 1 through stage 4 chronic kidney disease, or unspecified chronic kidney disease: Secondary | ICD-10-CM | POA: Diagnosis present

## 2020-08-28 DIAGNOSIS — M25561 Pain in right knee: Secondary | ICD-10-CM | POA: Diagnosis not present

## 2020-08-28 DIAGNOSIS — Z79890 Hormone replacement therapy (postmenopausal): Secondary | ICD-10-CM

## 2020-08-28 DIAGNOSIS — J449 Chronic obstructive pulmonary disease, unspecified: Secondary | ICD-10-CM

## 2020-08-28 DIAGNOSIS — M65831 Other synovitis and tenosynovitis, right forearm: Principal | ICD-10-CM | POA: Diagnosis present

## 2020-08-28 DIAGNOSIS — I10 Essential (primary) hypertension: Secondary | ICD-10-CM | POA: Diagnosis not present

## 2020-08-28 DIAGNOSIS — M25431 Effusion, right wrist: Secondary | ICD-10-CM | POA: Diagnosis present

## 2020-08-28 DIAGNOSIS — E039 Hypothyroidism, unspecified: Secondary | ICD-10-CM

## 2020-08-28 DIAGNOSIS — K219 Gastro-esophageal reflux disease without esophagitis: Secondary | ICD-10-CM | POA: Diagnosis present

## 2020-08-28 DIAGNOSIS — Z8249 Family history of ischemic heart disease and other diseases of the circulatory system: Secondary | ICD-10-CM

## 2020-08-28 DIAGNOSIS — J439 Emphysema, unspecified: Secondary | ICD-10-CM | POA: Diagnosis present

## 2020-08-28 DIAGNOSIS — Z87891 Personal history of nicotine dependence: Secondary | ICD-10-CM

## 2020-08-28 DIAGNOSIS — Z96643 Presence of artificial hip joint, bilateral: Secondary | ICD-10-CM | POA: Diagnosis present

## 2020-08-28 DIAGNOSIS — F411 Generalized anxiety disorder: Secondary | ICD-10-CM

## 2020-08-28 DIAGNOSIS — M25539 Pain in unspecified wrist: Secondary | ICD-10-CM | POA: Diagnosis present

## 2020-08-28 DIAGNOSIS — Z8616 Personal history of COVID-19: Secondary | ICD-10-CM

## 2020-08-28 DIAGNOSIS — F32A Depression, unspecified: Secondary | ICD-10-CM | POA: Diagnosis present

## 2020-08-28 DIAGNOSIS — M25531 Pain in right wrist: Secondary | ICD-10-CM

## 2020-08-28 DIAGNOSIS — Z90710 Acquired absence of both cervix and uterus: Secondary | ICD-10-CM

## 2020-08-28 DIAGNOSIS — E785 Hyperlipidemia, unspecified: Secondary | ICD-10-CM | POA: Diagnosis present

## 2020-08-28 DIAGNOSIS — Z96612 Presence of left artificial shoulder joint: Secondary | ICD-10-CM | POA: Diagnosis present

## 2020-08-28 DIAGNOSIS — Z800 Family history of malignant neoplasm of digestive organs: Secondary | ICD-10-CM

## 2020-08-28 LAB — COMPREHENSIVE METABOLIC PANEL
ALT: 14 U/L (ref 0–44)
AST: 16 U/L (ref 15–41)
Albumin: 2.9 g/dL — ABNORMAL LOW (ref 3.5–5.0)
Alkaline Phosphatase: 63 U/L (ref 38–126)
Anion gap: 8 (ref 5–15)
BUN: 29 mg/dL — ABNORMAL HIGH (ref 8–23)
CO2: 28 mmol/L (ref 22–32)
Calcium: 9.3 mg/dL (ref 8.9–10.3)
Chloride: 99 mmol/L (ref 98–111)
Creatinine, Ser: 0.97 mg/dL (ref 0.44–1.00)
GFR, Estimated: >60 mL/min (ref >60)
Glucose, Bld: 128 mg/dL — ABNORMAL HIGH (ref 70–99)
Potassium: 4.6 mmol/L (ref 3.5–5.1)
Sodium: 135 mmol/L (ref 135–145)
Total Bilirubin: 0.4 mg/dL (ref 0.3–1.2)
Total Protein: 6.4 g/dL — ABNORMAL LOW (ref 6.5–8.1)

## 2020-08-28 LAB — CBC WITH DIFFERENTIAL/PLATELET
Abs Immature Granulocytes: 0.03 10*3/uL (ref 0.00–0.07)
Basophils Absolute: 0 10*3/uL (ref 0.0–0.1)
Basophils Relative: 0 %
Eosinophils Absolute: 0 10*3/uL (ref 0.0–0.5)
Eosinophils Relative: 0 %
HCT: 35.4 % — ABNORMAL LOW (ref 36.0–46.0)
Hemoglobin: 11.5 g/dL — ABNORMAL LOW (ref 12.0–15.0)
Immature Granulocytes: 0 %
Lymphocytes Relative: 15 %
Lymphs Abs: 1.2 10*3/uL (ref 0.7–4.0)
MCH: 31.5 pg (ref 26.0–34.0)
MCHC: 32.5 g/dL (ref 30.0–36.0)
MCV: 97 fL (ref 80.0–100.0)
Monocytes Absolute: 0.2 10*3/uL (ref 0.1–1.0)
Monocytes Relative: 3 %
Neutro Abs: 6.3 10*3/uL (ref 1.7–7.7)
Neutrophils Relative %: 82 %
Platelets: 327 10*3/uL (ref 150–400)
RBC: 3.65 MIL/uL — ABNORMAL LOW (ref 3.87–5.11)
RDW: 14 % (ref 11.5–15.5)
WBC: 7.8 10*3/uL (ref 4.0–10.5)
nRBC: 0 % (ref 0.0–0.2)

## 2020-08-28 LAB — BODY FLUID CULTURE

## 2020-08-28 LAB — C-REACTIVE PROTEIN: CRP: 14.2 mg/dL — ABNORMAL HIGH (ref <1.0)

## 2020-08-28 LAB — SEDIMENTATION RATE: Sed Rate: 37 mm/hr — ABNORMAL HIGH (ref 0–22)

## 2020-08-28 MED ORDER — SIMVASTATIN 20 MG PO TABS
20.0000 mg | ORAL_TABLET | Freq: Every day | ORAL | Status: DC
  Administered 2020-08-29 – 2020-09-06 (×10): 20 mg via ORAL
  Filled 2020-08-28 (×10): qty 1

## 2020-08-28 MED ORDER — HYDRALAZINE HCL 20 MG/ML IJ SOLN: 10.0000 mg | Freq: Four times a day (QID) | INTRAMUSCULAR | Status: DC | PRN

## 2020-08-28 MED ORDER — ONDANSETRON HCL 4 MG PO TABS: 4.0000 mg | ORAL_TABLET | Freq: Four times a day (QID) | ORAL | Status: DC | PRN

## 2020-08-28 MED ORDER — ALBUTEROL SULFATE (2.5 MG/3ML) 0.083% IN NEBU: 2.5000 mg | INHALATION_SOLUTION | Freq: Four times a day (QID) | RESPIRATORY_TRACT | Status: DC | PRN

## 2020-08-28 MED ORDER — VANCOMYCIN HCL 1500 MG/300ML IV SOLN
1500.0000 mg | Freq: Once | INTRAVENOUS | Status: AC
  Administered 2020-08-29: 1500 mg via INTRAVENOUS
  Filled 2020-08-28: qty 300

## 2020-08-28 MED ORDER — BUPROPION HCL ER (XL) 150 MG PO TB24
150.0000 mg | ORAL_TABLET | Freq: Every day | ORAL | Status: DC
  Administered 2020-08-30 – 2020-09-07 (×9): 150 mg via ORAL
  Filled 2020-08-28 (×10): qty 1

## 2020-08-28 MED ORDER — HYDROMORPHONE HCL 1 MG/ML IJ SOLN
1.0000 mg | INTRAMUSCULAR | Status: DC | PRN
  Administered 2020-08-29 – 2020-09-01 (×4): 1 mg via INTRAVENOUS
  Filled 2020-08-28 (×5): qty 1

## 2020-08-28 MED ORDER — MIRTAZAPINE 15 MG PO TABS
30.0000 mg | ORAL_TABLET | Freq: Every day | ORAL | Status: DC
  Administered 2020-08-29 – 2020-09-06 (×10): 30 mg via ORAL
  Filled 2020-08-28 (×5): qty 2
  Filled 2020-08-28: qty 1
  Filled 2020-08-28 (×4): qty 2

## 2020-08-28 MED ORDER — LEVOTHYROXINE SODIUM 50 MCG PO TABS
50.0000 ug | ORAL_TABLET | Freq: Every day | ORAL | Status: DC
  Administered 2020-08-29 – 2020-09-06 (×10): 50 ug via ORAL
  Filled 2020-08-28 (×10): qty 1

## 2020-08-28 MED ORDER — POLYETHYLENE GLYCOL 3350 17 G PO PACK: 17.0000 g | PACK | Freq: Every day | ORAL | Status: DC | PRN

## 2020-08-28 MED ORDER — LORAZEPAM 1 MG PO TABS
1.0000 mg | ORAL_TABLET | Freq: Every evening | ORAL | Status: DC | PRN
  Administered 2020-09-02 – 2020-09-04 (×3): 1 mg via ORAL
  Filled 2020-08-28 (×3): qty 1

## 2020-08-28 MED ORDER — KETOTIFEN FUMARATE 0.025 % OP SOLN: 1.0000 [drp] | Freq: Two times a day (BID) | OPHTHALMIC | Status: DC | PRN

## 2020-08-28 MED ORDER — ONDANSETRON HCL 4 MG/2ML IJ SOLN: 4.0000 mg | Freq: Four times a day (QID) | INTRAMUSCULAR | Status: DC | PRN

## 2020-08-28 MED ORDER — MIRABEGRON ER 25 MG PO TB24
25.0000 mg | ORAL_TABLET | Freq: Every day | ORAL | Status: DC
  Administered 2020-08-29 – 2020-09-06 (×10): 25 mg via ORAL
  Filled 2020-08-28 (×11): qty 1

## 2020-08-28 MED ORDER — DIVALPROEX SODIUM ER 500 MG PO TB24
500.0000 mg | ORAL_TABLET | Freq: Every day | ORAL | Status: DC
  Administered 2020-08-29 – 2020-09-06 (×10): 500 mg via ORAL
  Filled 2020-08-28 (×11): qty 1

## 2020-08-28 MED ORDER — HYDROMORPHONE HCL 1 MG/ML IJ SOLN
0.5000 mg | INTRAMUSCULAR | Status: DC | PRN
  Administered 2020-08-31 – 2020-09-03 (×5): 0.5 mg via INTRAVENOUS
  Filled 2020-08-28 (×5): qty 0.5

## 2020-08-28 MED ORDER — IPRATROPIUM-ALBUTEROL 0.5-2.5 (3) MG/3ML IN SOLN: 3.0000 mL | Freq: Four times a day (QID) | RESPIRATORY_TRACT | Status: DC | PRN

## 2020-08-28 MED ORDER — HYDROMORPHONE HCL 1 MG/ML IJ SOLN
1.0000 mg | Freq: Once | INTRAMUSCULAR | Status: AC
  Administered 2020-08-28: 1 mg via INTRAVENOUS
  Filled 2020-08-28: qty 1

## 2020-08-28 MED ORDER — ACETAMINOPHEN 650 MG RE SUPP: 650.0000 mg | Freq: Four times a day (QID) | RECTAL | Status: DC | PRN

## 2020-08-28 MED ORDER — ENOXAPARIN SODIUM 40 MG/0.4ML IJ SOSY
40.0000 mg | PREFILLED_SYRINGE | Freq: Every day | INTRAMUSCULAR | Status: DC
  Administered 2020-08-30 – 2020-09-01 (×3): 40 mg via SUBCUTANEOUS
  Filled 2020-08-28 (×3): qty 0.4

## 2020-08-28 MED ORDER — SODIUM CHLORIDE 0.9 % IV SOLN
2.0000 g | Freq: Once | INTRAVENOUS | Status: AC
  Administered 2020-08-28: 2 g via INTRAVENOUS
  Filled 2020-08-28: qty 2

## 2020-08-28 MED ORDER — FLUTICASONE PROPIONATE 50 MCG/ACT NA SUSP: 2.0000 | Freq: Every day | NASAL | Status: DC | PRN

## 2020-08-28 MED ORDER — LACTATED RINGERS IV SOLN: INTRAVENOUS | Status: AC

## 2020-08-28 MED ORDER — BUSPIRONE HCL 5 MG PO TABS
15.0000 mg | ORAL_TABLET | Freq: Two times a day (BID) | ORAL | Status: DC
  Administered 2020-08-29 – 2020-09-07 (×19): 15 mg via ORAL
  Filled 2020-08-28 (×8): qty 1
  Filled 2020-08-28: qty 2
  Filled 2020-08-28 (×10): qty 1

## 2020-08-28 MED ORDER — ACETAMINOPHEN 325 MG PO TABS
650.0000 mg | ORAL_TABLET | Freq: Four times a day (QID) | ORAL | Status: DC | PRN
  Administered 2020-09-03 – 2020-09-04 (×4): 650 mg via ORAL
  Filled 2020-08-28 (×4): qty 2

## 2020-08-28 NOTE — ED Triage Notes (Signed)
Patient complains of 3 weeks of ongoing right wrist pain and swelling. Has seen ortho for same and finished steroid and antibiotic. Denies trauma.

## 2020-08-28 NOTE — ED Notes (Signed)
Painful swollen rt wrist for 3 weeks under the care of an ortho doctor  Here for a second opinion  Sl discolored  Nothing has helped thus far   No known injury

## 2020-08-28 NOTE — Progress Notes (Signed)
Patient ID: Lori Orozco, female   DOB: [birth year], 70 y.o.   MRN: [id number] The patient is well-known to me.  I actually saw her this past Monday almost a week ago and aspirated fluid from her right wrist.  The Gram stain was negative for organisms and the cultures have still not grown out anything.  There was a high white blood cell count with gram stain.  The remainder of the fluid coagulated so we could not obtain a crystal analysis or cell count.  I then saw her this past Thursday which was 3 days ago.  Although she was having pain, the swelling had decreased and there was no redness.  We have had her on both a steroid and an oral antibiotic.  Apparently she presented to the emergency room with worsening right wrist pain and swelling.  Her white blood cell count is normal but her CRP and sed rate are elevated.  I have recommended medical admission and starting broad-spectrum IV antibiotics.  I have still seen inflammatory and rheumatologic process is present like this.  I will see her tomorrow and make recommendations as to whether or not I MRI is needed or repeat aspiration of the right wrist.

## 2020-08-28 NOTE — ED Provider Notes (Signed)
Emergency Medicine Provider Triage Evaluation Note  Lori Orozco , a 70 y.o. female  was evaluated in triage.  Pt complains of pain and swelling to right wrist.  She reports that she has had the symptoms over the last 3 weeks.  Swelling has improved since then.  Has been following up with her orthopedic provider Dr. [name].  Patient had emergency room today because she is not having any improvement in her pain.  Patient denies any recent falls or injuries  Per chart review patient last seen 6/30, aspirated fluid from wrist at elevated white blood cell count but no evidence of organisms.  Patient is currently on prednisone and doxycycline.  Dr. [name] had seen an improvement in her swelling    Review of Systems  Positive: Arthralgias, myalgias Negative: Fever, chills, numbness, weakness  Physical Exam  BP (!) 157/75 (BP Location: Left Arm)   Pulse 91   Temp 97.8 F (36.6 C) (Oral)   Resp 15   SpO2 93%  Gen:   Awake, no distress   Resp:  Normal effort  MSK:   Moves extremities without difficulty.  Patient has diffuse swelling to right wrist and hand.  Tenderness to palpation throughout right wrist and hand.  No areas of erythema noted.  +2 right radial pulse sensation intact to all digits of right hand.  Full range of motion to all digits of right hand. Other:    Medical Decision Making  Medically screening exam initiated at 2:40 PM.  Appropriate orders placed.  Lori Orozco was informed that the remainder of the evaluation will be completed by another provider, this initial triage assessment does not replace that evaluation, and the importance of remaining in the ED until their evaluation is complete.  The patient appears stable so that the remainder of the work up may be completed by another provider.      Haskel Schroeder, PA-C 08/28/20 1442    Koleen Distance, MD 08/28/20 [phone]

## 2020-08-28 NOTE — H&P (Signed)
History and Physical    Lori Orozco UEA:[id number] DOB: 1950/05/29 DOA: 08/28/2020  PCP: [name], MD  Patient coming from: Home   Chief Complaint: Home    HPI:    70 year old female with past medical history of hypothyroidism, hyperlipidemia, generalized anxiety disorder, hypertension who presents to [facility] emergency department with complaints of right wrist pain.  Planes that approximately 1 month ago she began to experience mild discomfort in her right wrist.  Over the following weeks this wrist pain became more more severe at eventually became severe in intensity.  Pain is waxing and waning but usually between 9 out of 10 and 10 out of 10 in intensity.  Pain is sharp in quality, radiating proximally and worse with any movement of the wrist joint whatsoever.  Patient denies any associated nausea, vomiting, fever or weakness.  Patient's symptoms continue to persist for several weeks until she eventually was seen in orthopedic surgery clinic on 6/23.  At that time, an x-ray was performed which was found to be unremarkable.  Patient's wrist was placed in a splint and patient was placed on a course of colchicine.  Over the next several days patient's symptoms did not improved.  Patient presented once again to the orthopedic surgery clinic on 6/27.  During that visit a joint aspiration was performed.  Unfortunately, cell count and crystal analysis could not be interpreted due to clotting of the sample.  No organisms could be identified on the gram stain.  Patient was therefore sent home on a course of oral doxycycline and prednisone.  Over the next several days patient took the doxycycline and prednisone as instructed.  Patient presented once again to Dr. [name] in orthopedic surgery clinic on 6/30 for repeat assessment and during that visit it was felt that the swelling and warmth of the wrist had improved somewhat.  Patient eventually presented to [facility]  emergency department for evaluation.  Evaluation in the emergency department patient was found to have substantial right wrist pain.  Patient exhibited no evidence of fever or leukocytosis.  Case was discussed with Dr. [name] with orthopedic surgery who recommended initiation of broad-spectrum antibiotics and hospitalization on the hospital service with orthopedic surgery to evaluate the patient tomorrow morning for potential MRI or repeat aspiration of the wrist.  The hospitalist group was then called to assess the patient for admission to the hospital.  Review of Systems:   Review of Systems  Musculoskeletal:  Positive for joint pain.   Past Medical History:  Diagnosis Date   Amaurosis fugax    Anemia    hx   Anxiety    Arthritis    Asthma    Cataract    Chronic fatigue syndrome    Chronic kidney disease    frequency, nephritis when 70 yrs old   COPD (chronic obstructive pulmonary disease) (HCC)    COVID-19    Depression    Diverticulitis    Emphysema of lung (HCC)    Fibromyalgia    GERD (gastroesophageal reflux disease)    occ   H/O hiatal hernia    History of bronchitis    Hyperlipidemia    Hypothyroidism    Interstitial cystitis    Irritable bowel syndrome    Lichen sclerosus    Lumbar herniated disc    Migraines    Pneumonia    PONV (postoperative nausea and vomiting)    Shortness of breath    exertion    Thyroid disease  [place]   Urinary frequency    Urinary tract infection    Vertigo     Past Surgical History:  Procedure Laterality Date   ABDOMINAL HYSTERECTOMY     bladder sugery      CAROTID DOPPLAR     COLONOSCOPY  05/26/2010   avms- otherwise nl , re check 10y   DEXA-OSTEOPENIA     DOPPLER ECHOCARDIOGRAPHY     elbow surgery     EPICONDYLITIS     EYE SURGERY Bilateral    cataracts   FOOT SURGERY Bilateral    KNEE SURGERY     Left cartilage   lipoma in second finger right hand     PLANTAR FASCIA SURGERY Left    REVERSE SHOULDER  ARTHROPLASTY Left 02/02/2020   Procedure: LEFT REVERSE SHOULDER ARTHROPLASTY;  Surgeon: [name], MD;  Location: [facility] OR;  Service: Orthopedics;  Laterality: Left;   ROTATOR CUFF REPAIR Left 12/2017   TEMPOROMANDIBULAR JOINT SURGERY     TONSILLECTOMY     TOTAL HIP ARTHROPLASTY Right 03/20/2013   Procedure: Right TOTAL HIP ARTHROPLASTY;  Surgeon: [name], MD;  Location: [facility] OR;  Service: Orthopedics;  Laterality: Right;  Right Total Hip Arthroplasty   TOTAL KNEE ARTHROPLASTY Left 01/28/2015   Procedure: LEFT TOTAL KNEE ARTHROPLASTY;  Surgeon: [name], MD;  Location: [facility] ORS;  Service: Orthopedics;  Laterality: Left;   TRIGGER FINGER RELEASE Right    TROCHANTERIC BURSA EXCISION Right 05/03/2016   Dr. [name], [name]   WRIST ARTHROSCOPY Right    ligament tear     reports that she has quit smoking. Her smoking use included e-cigarettes and cigarettes. She has a 80.00 pack-year smoking history. She has never used smokeless tobacco. She reports that she does not drink alcohol and does not use drugs.  Allergies  Allergen Reactions   Lithium Anaphylaxis   Tegretol [Carbamazepine] Other (See Comments)    Fever and body aches (fever over 103)   Tricyclic Antidepressants Anaphylaxis   Strawberry Extract Hives   Codeine Nausea Only    Makes pt stay awake   Cymbalta [Duloxetine Hcl] Other (See Comments)    Makes pt pass out    Erythromycin     abdominal pain   Lyrica [Pregabalin]     Felt faint   Neurontin [Gabapentin]     Passes  out   Rabeprazole Sodium     insomnia   Duraprep [Antiseptic Products, Misc.] Itching and Rash    Tolerates Betadine    Penicillins Rash    Tolerated ANCEF 02/02/20  Has patient had a PCN reaction causing immediate rash, facial/tongue/throat swelling, SOB or lightheadedness with hypotension: Yes Has patient had a PCN reaction causing severe rash involving mucus membranes or skin necrosis: No Has patient had a PCN reaction that  required hospitalization No Has patient had a PCN reaction occurring within the last 10 years: No If all of the above answers are "NO", then may proceed with Cephalosporin use.    Tape Rash    PT ALLERGIC NYLON TAPE     Family History  Problem Relation Age of Onset   Thyroid disease Mother    Hypertension Mother    Kidney disease Mother    Cancer Father    Colon cancer Other      Prior to Admission medications   Medication Sig Start Date End Date Taking? Authorizing Provider  albuterol (VENTOLIN HFA) 108 (90 Base) MCG/ACT inhaler Inhale 2 puffs into the lungs every 6 (six) hours  as needed for wheezing or shortness of breath.   Yes [provider]  buPROPion (WELLBUTRIN XL) 150 MG 24 hr tablet Take 150 mg by mouth daily.   Yes [provider]  busPIRone (BUSPAR) 15 MG tablet Take 15 mg by mouth 2 (two) times daily before a meal.   Yes [provider]  Cholecalciferol (VITAMIN D3) 2000 UNITS TABS Take 2,000 Units by mouth daily.   Yes [provider]  clobetasol cream (TEMOVATE) 0.05 % APPLY SMALL AMOUNT TO AFFECTED AREAS TWO TO THREE TIMES PER WEEK Patient taking differently: Apply 1 application topically 2 (two) times a week. 08/24/19  Yes Lesly Dukes, MD  cyanocobalamin 2000 MCG tablet Take 2,000 mcg by mouth daily.   Yes [provider]  diphenhydrAMINE (BENADRYL) 25 MG tablet Take 25 mg by mouth every 6 (six) hours as needed for allergies.   Yes [provider]  divalproex (DEPAKOTE ER) 500 MG 24 hr tablet Take 500 mg by mouth at bedtime.  11/02/19  Yes [provider]  doxycycline (VIBRA-TABS) 100 MG tablet Take 1 tablet (100 mg total) by mouth 2 (two) times daily for 14 days. 08/22/20 09/05/20 Yes Kirtland Bouchard, PA-C  fluticasone (FLONASE) 50 MCG/ACT nasal spray PLACE 2 SPRAYS INTO THE NOSE DAILY. Patient taking differently: Place 2 sprays into both nostrils daily as needed for allergies. 07/01/18  Yes Tower, Audrie Gallus,  MD  HYDROcodone-acetaminophen (NORCO) 7.5-325 MG tablet Take 1 tablet by mouth every 6 (six) hours as needed for moderate pain. 08/25/20  Yes [name], MD  ibuprofen (ADVIL) 200 MG tablet Take 200 mg by mouth every 6 (six) hours as needed for moderate pain.   Yes [provider]  ipratropium-albuterol (DUONEB) 0.5-2.5 (3) MG/3ML SOLN Take 3 mLs by nebulization every 6 (six) hours as needed. Patient taking differently: Take 3 mLs by nebulization every 6 (six) hours as needed (sob/wheezing). 12/29/19  Yes Young, Joni Fears D, MD  ketotifen (ZADITOR) 0.025 % ophthalmic solution Place 1 drop into both eyes 2 (two) times daily as needed (allergies).   Yes [provider]  levothyroxine (SYNTHROID) 50 MCG tablet TAKE ONE TABLET BY MOUTH DAILY BEFORE BREAKFAST Patient taking differently: Take 50 mcg by mouth at bedtime. 07/28/20  Yes Tower, Audrie Gallus, MD  LORazepam (ATIVAN) 1 MG tablet Take 1 mg by mouth at bedtime as needed for anxiety or sleep. 06/28/20  Yes [provider]  mirabegron ER (MYRBETRIQ) 25 MG TB24 tablet Take 1 tablet (25 mg total) by mouth daily. Patient taking differently: Take 25 mg by mouth at bedtime. 07/08/20  Yes Tower, Audrie Gallus, MD  mirtazapine (REMERON) 30 MG tablet Take 30 mg by mouth at bedtime.  07/22/15  Yes [provider]  ondansetron (ZOFRAN-ODT) 4 MG disintegrating tablet Take 4 mg by mouth every 8 (eight) hours as needed for nausea or vomiting.   Yes [provider]  predniSONE (DELTASONE) 50 MG tablet Take 1 tablet (50 mg total) by mouth daily with breakfast. 08/22/20  Yes Kirtland Bouchard, PA-C  PREMARIN 0.625 MG tablet Take 1 tablet (0.625 mg total) by mouth daily. Take one tablet daily Patient taking differently: Take 0.625 mg by mouth daily. 08/24/19  Yes Lesly Dukes, MD  sertraline (ZOLOFT) 100 MG tablet Take 100 mg by mouth daily.  12/06/14  Yes [provider]  simvastatin (ZOCOR) 20 MG tablet TAKE ONE  TABLET BY MOUTH EVERY NIGHT AT BEDTIME 08/02/20  Yes Tower, [employer]	[place],  MD  Colchicine 0.6 MG CAPS Take 0.6 mg by mouth daily. Take one capsule  Every hour for three doses first day then one daily thereafter for one month. 08/18/20   Kirtland Bouchard, PA-C    Physical Exam: Vitals:   08/28/20 1915 08/28/20 1930 08/28/20 2000 08/28/20 2104  BP: (!) 154/87 (!) 144/89 (!) 146/75 (!) 167/79  Pulse:    78  Resp:    18  Temp:    98.6 F (37 C)  TempSrc:    Oral  SpO2:    93%    Constitutional: Awake alert and oriented x3, patient is in distress due to wrist pain. Skin: Mild redness noted of the right wrist.  No rashes, no lesions, good skin turgor noted. Eyes: Pupils are equally reactive to light.  No evidence of scleral icterus or conjunctival pallor.  ENMT: Moist mucous membranes noted.  Posterior pharynx clear of any exudate or lesions.   Neck: normal, supple, no masses, no thyromegaly.  No evidence of jugular venous distension.   Respiratory: clear to auscultation bilaterally, no wheezing, no crackles. Normal respiratory effort. No accessory muscle use.  Cardiovascular: Regular rate and rhythm, no murmurs / rubs / gallops. No extremity edema. 2+ pedal pulses. No carotid bruits.  Chest:   Nontender without crepitus or deformity.   Back:   Nontender without crepitus or deformity. Abdomen: Abdomen is soft and nontender.  No evidence of intra-abdominal masses.  Positive bowel sounds noted in all quadrants.   Musculoskeletal: No joint deformity upper and lower extremities. Good ROM, no contractures. Normal muscle tone.  Neurologic: CN 2-12 grossly intact. Sensation intact.  Patient moving all 4 extremities spontaneously.  Patient is following all commands.  Patient is responsive to verbal stimuli.   Psychiatric: Patient exhibits anxious mood with appropriate affect.  Patient seems to possess insight as to their current situation.     Labs on Admission: I have personally reviewed following labs and  imaging studies -   CBC: Recent Labs  Lab 08/28/20 2033  WBC 7.8  NEUTROABS 6.3  HGB 11.5*  HCT 35.4*  MCV 97.0  PLT 327   Basic Metabolic Panel: Recent Labs  Lab 08/28/20 2033  NA 135  K 4.6  CL 99  CO2 28  GLUCOSE 128*  BUN 29*  CREATININE 0.97  CALCIUM 9.3   GFR: CrCl cannot be calculated (Unknown ideal weight.). Liver Function Tests: Recent Labs  Lab 08/28/20 2033  AST 16  ALT 14  ALKPHOS 63  BILITOT 0.4  PROT 6.4*  ALBUMIN 2.9*   No results for input(s): LIPASE, AMYLASE in the last 168 hours. No results for input(s): AMMONIA in the last 168 hours. Coagulation Profile: No results for input(s): INR, PROTIME in the last 168 hours. Cardiac Enzymes: No results for input(s): CKTOTAL, CKMB, CKMBINDEX, TROPONINI in the last 168 hours. BNP (last 3 results) No results for input(s): PROBNP in the last 8760 hours. HbA1C: No results for input(s): HGBA1C in the last 72 hours. CBG: No results for input(s): GLUCAP in the last 168 hours. Lipid Profile: No results for input(s): CHOL, HDL, LDLCALC, TRIG, CHOLHDL, LDLDIRECT in the last 72 hours. Thyroid Function Tests: No results for input(s): TSH, T4TOTAL, FREET4, T3FREE, THYROIDAB in the last 72 hours. Anemia Panel: No results for input(s): VITAMINB12, FOLATE, FERRITIN, TIBC, IRON, RETICCTPCT in the last 72 hours. Urine analysis:    Component Value Date/Time   COLORURINE YELLOW 01/28/2020 0949   APPEARANCEUR CLEAR 01/28/2020 0949   LABSPEC 1.020  01/28/2020 0949   PHURINE 5.0 01/28/2020 0949   GLUCOSEU NEGATIVE 01/28/2020 0949   HGBUR NEGATIVE 01/28/2020 0949   BILIRUBINUR NEGATIVE 01/28/2020 0949   BILIRUBINUR negative 07/27/2015 1020   KETONESUR NEGATIVE 01/28/2020 0949   PROTEINUR NEGATIVE 01/28/2020 0949   UROBILINOGEN negative 07/27/2015 1020   NITRITE NEGATIVE 01/28/2020 0949   LEUKOCYTESUR NEGATIVE 01/28/2020 0949    Radiological Exams on Admission - Personally Reviewed: DG Wrist Complete  Right  Result Date: 08/28/2020 CLINICAL DATA:  Pain and swelling. Limited range of motion without pain. EXAM: RIGHT WRIST - COMPLETE 3+ VIEW COMPARISON:  08/18/2020 FINDINGS: There is no acute fracture. There is ulnar minus variance. There is well corticated fragmentation of the ulnar styloid region, consistent with remote injury. Degenerative changes are identified at the first carpometacarpal joint and radiocarpal joint. Degenerative changes of the interphalangeal joint of the thumb. IMPRESSION: Degenerative changes.  No evidence for acute  abnormality. Electronically Signed   By: Norva Pavlov M.D.   On: 08/28/2020 15:07    Telemetry: Personally reviewed.  Normal sinus rhythm heart rate of 78bpm.  Assessment/Plan Principal Problem:   Pain and swelling of right wrist  Patient presenting with ongoing substantial redness of the right wrist and surrounding hand/forearm Examination reveals that wrist is additionally somewhat warm with minimal redness Etiology of this ongoing inflammation the wrist is currently unclear.  Unfortunately the cell count and crystal analysis from the arthrocentesis performed on 6/27 could not be interpreted due to clotting. X-ray does not reveal any evidence of bony destruction.  Patient exhibits no leukocytosis or fever.  These findings are reassuring. That being said, patient is additionally on prednisone and therefore presentation may be somewhat subdued. Per Dr. Eliberto Ivory recommendations patient has been placed on broad-spectrum intravenous antibiotics with cefepime and vancomycin Hydrating gently with intravenous isotonic fluid As needed opiate-based analgesics for associated substantial pain Orthopedic surgery to evaluate the patient in the morning and determine whether or not they will repeat arthrocentesis or proceed with MRI of the wrist. We will additionally obtain blood cultures.  Active Problems:   Generalized anxiety disorder  Continue home regimen of  psychotropic agents    COPD (chronic obstructive pulmonary disease) (HCC)  No evidence of COPD exacerbation at this time As needed bronchodilator therapy for shortness of breath and wheezing    Hypothyroidism  Continue home regimen of Synthroid    Essential hypertension  Patient reports that she has been diagnosed with hypertension and was recently tried on a trial of amlodipine as she apparently could not tolerate due to some unclear side effect Patient does not want to be placed on an alternative antihypertensive at this time. Monitoring blood pressures, will provide as needed intravenous hypertensives for markedly elevated blood pressure and revisit restarting home antihypertensive with patient later in the hospitalization.   Code Status:  Full code Family Communication: deferred   Status is: Observation  The patient remains OBS appropriate and will d/c before 2 midnights.  Dispo: The patient is from: Home              Anticipated d/c is to: Home              Patient currently is not medically stable to d/c.   Difficult to place patient No        Marinda Elk MD [facility] Pager [phone]  If 7PM-7AM, please contact night-coverage [email or web address] Use universal [place] password for that web site. If you do not have the password,  please call the hospital operator.  08/28/2020, 11:30 PM

## 2020-08-28 NOTE — ED Notes (Signed)
Pt repeat labs sent to lab pt upset about the time here and about not being able to eat

## 2020-08-28 NOTE — ED Provider Notes (Signed)
[facility] Provider Note   CSN: [id number] Arrival date & time: 08/28/20  1255     History No chief complaint on file.   Lori Orozco is a 70 y.o. female.  HPI 60yoF pmhx fibromyalgia, hypothyroidism, hld, tia, asthma, copd (emphysema), ckd, anxiety/depression, hiatal hernia w/ gerd, interstitial cystitis, p/w R wrist pain/swelling x3wk. Was seen by ortho (Dr. Ninfa Linden) 1wk ago, arthrocentesis w/ elevated wbcs but no organisms, started on doxy + prednisone w/ improved swelling but persisting pain. Pain is 10/10 severity, most focused over ulnar aspect right wrist, radiating to hand and remainder of wrist, throbbing/stabbing/tight quality, no improving factors (including splint, vicodin, oxycodone), worse w/ movement and touch. No known injury. No known autoimmune hx. No further medical concern at this time, including fevers, chills, diaphoresis, CP, SOB, NVDC, abdominal pain.     Past Medical History:  Diagnosis Date   Amaurosis fugax    Anemia    hx   Anxiety    Arthritis    Asthma    Cataract    Chronic fatigue syndrome    Chronic kidney disease    frequency, nephritis when 70 yrs old   COPD (chronic obstructive pulmonary disease) ([place])    COVID-19    Depression    Diverticulitis    Emphysema of lung (HCC)    Fibromyalgia    GERD (gastroesophageal reflux disease)    occ   H/O hiatal hernia    History of bronchitis    Hyperlipidemia    Hypothyroidism    Interstitial cystitis    Irritable bowel syndrome    Lichen sclerosus    Lumbar herniated disc    Migraines    Pneumonia    PONV (postoperative nausea and vomiting)    Shortness of breath    exertion    Thyroid disease    Graves   Urinary frequency    Urinary tract infection    Vertigo     Patient Active Problem List   Diagnosis Date Noted   Pain and swelling of right wrist 08/28/2020   S/P reverse total shoulder arthroplasty, 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   Lung nodule <  6cm on CT 01/02/2020   Rash and nonspecific skin eruption 12/17/2018   Fever 09/30/2018   Status post arthroscopy of left shoulder 01/16/2018   Estrogen deficiency 06/28/2017   Medial epicondylitis, left elbow 04/29/2017   S/P arthroscopy of left shoulder 02/25/2017   Impingement syndrome of left shoulder 01/30/2017   Pedal edema 10/26/2016   Trochanteric bursitis, right hip 04/10/2016   Essential hypertension 09/02/2015   Urge incontinence 07/27/2015   Obesity 04/28/2015   Need for hepatitis C screening test 04/26/2015   Screening for HIV (human immunodeficiency virus) 04/26/2015   Osteoarthritis of left knee 01/28/2015   Status post total left knee replacement 01/28/2015   Vitamin D deficiency 04/27/2014   Seasonal and perennial allergic rhinitis 06/17/2013   S/P total hip arthroplasty 03/20/2013   Medicare annual wellness visit, subsequent 11/18/2012   History of falling 11/18/2012   Routine general medical examination at a health care facility 21/30/8657   Lichen sclerosus et atrophicus of the vulva 07/29/2012   Osteopenia 10/19/2011   Other screening mammogram 10/19/2011   Hypothyroidism 10/19/2011   ANEMIA 10/11/2009   PERIPHERAL NEUROPATHY, FEET 08/05/2009   BACK PAIN 08/05/2009   HAND PAIN, BILATERAL 08/05/2009   TREMOR 03/09/2008   MENOPAUSAL SYNDROME 10/09/2007   DEPRESSION, MAJOR 05/20/2007   BIPOLAR AFFECTIVE DISORDER 05/20/2007   Generalized anxiety  disorder 05/20/2007   PERSONALITY DISORDER 05/20/2007   ESOPHAGEAL SPASM 05/20/2007   HIATAL HERNIA 05/20/2007   AMAUROSIS FUGAX 05/07/2007   COPD (chronic obstructive pulmonary disease) ([place]) 03/27/2007   HYPERCHOLESTEROLEMIA, PURE 12/10/2006   SYMPTOM, SYNDROME, CHRONIC FATIGUE 12/10/2006    Past Surgical History:  Procedure Laterality Date   ABDOMINAL HYSTERECTOMY     bladder sugery      CAROTID DOPPLAR     COLONOSCOPY  05/26/2010   avms- otherwise nl , re check 10y   DEXA-OSTEOPENIA     DOPPLER  ECHOCARDIOGRAPHY     elbow surgery     EPICONDYLITIS     EYE SURGERY Bilateral    cataracts   FOOT SURGERY Bilateral    KNEE SURGERY     Left cartilage   lipoma in second finger right hand     PLANTAR FASCIA SURGERY Left    REVERSE SHOULDER ARTHROPLASTY Left 02/02/2020   Procedure: LEFT REVERSE SHOULDER ARTHROPLASTY;  Surgeon: Meredith Pel, MD;  Location: [facility];  Service: Orthopedics;  Laterality: Left;   ROTATOR CUFF REPAIR Left 12/2017   TEMPOROMANDIBULAR JOINT SURGERY     TONSILLECTOMY     TOTAL HIP ARTHROPLASTY Right 03/20/2013   Procedure: Right TOTAL HIP ARTHROPLASTY;  Surgeon: Newt Minion, MD;  Location: [facility];  Service: Orthopedics;  Laterality: Right;  Right Total Hip Arthroplasty   TOTAL KNEE ARTHROPLASTY Left 01/28/2015   Procedure: LEFT TOTAL KNEE ARTHROPLASTY;  Surgeon: Mcarthur Rossetti, MD;  Location: [facility];  Service: Orthopedics;  Laterality: Left;   TRIGGER FINGER RELEASE Right    TROCHANTERIC BURSA EXCISION Right 05/03/2016   Dr. Ninfa Linden, [place] Gave   WRIST ARTHROSCOPY Right    ligament tear     OB History     Gravida  0   Para  0   Term  0   Preterm  0   AB  0   Living  0      SAB  0   IAB  0   Ectopic  0   Multiple  0   Live Births              Family History  Problem Relation Age of Onset   Thyroid disease Mother    Hypertension Mother    Kidney disease Mother    Cancer Father    Colon cancer Other     Social History   Tobacco Use   Smoking status: Former    Packs/day: 2.00    Years: 40.00    Pack years: 80.00    Types: E-cigarettes, Cigarettes   Smokeless tobacco: Never   Tobacco comments:    quit in 2015  Vaping Use   Vaping Use: Some days   Devices: no nicotine in it  Substance Use Topics   Alcohol use: No    Alcohol/week: 0.0 standard drinks   Drug use: No    Home Medications Prior to Admission medications   Medication Sig Start Date End Date Taking? Authorizing Provider  albuterol  (VENTOLIN HFA) 108 (90 Base) MCG/ACT inhaler Inhale 2 puffs into the lungs every 6 (six) hours as needed for wheezing or shortness of breath.   Yes [provider]  buPROPion (WELLBUTRIN XL) 150 MG 24 hr tablet Take 150 mg by mouth daily.   Yes [provider]  busPIRone (BUSPAR) 15 MG tablet Take 15 mg by mouth 2 (two) times daily before a meal.   Yes [provider]  Cholecalciferol (VITAMIN D3) 2000  UNITS TABS Take 2,000 Units by mouth daily.   Yes [provider]  clobetasol cream (TEMOVATE) 0.05 % APPLY SMALL AMOUNT TO AFFECTED AREAS TWO TO THREE TIMES PER WEEK Patient taking differently: Apply 1 application topically 2 (two) times a week. 08/24/19  Yes Guss Bunde, MD  cyanocobalamin 2000 MCG tablet Take 2,000 mcg by mouth daily.   Yes [provider]  diphenhydrAMINE (BENADRYL) 25 MG tablet Take 25 mg by mouth every 6 (six) hours as needed for allergies.   Yes [provider]  divalproex (DEPAKOTE ER) 500 MG 24 hr tablet Take 500 mg by mouth at bedtime.  11/02/19  Yes [provider]  doxycycline (VIBRA-TABS) 100 MG tablet Take 1 tablet (100 mg total) by mouth 2 (two) times daily for 14 days. 08/22/20 09/05/20 Yes Pete Pelt, PA-C  fluticasone (FLONASE) 50 MCG/ACT nasal spray PLACE 2 SPRAYS INTO THE NOSE DAILY. Patient taking differently: Place 2 sprays into both nostrils daily as needed for allergies. 07/01/18  Yes Tower, Wynelle Fanny, MD  HYDROcodone-acetaminophen (NORCO) 7.5-325 MG tablet Take 1 tablet by mouth every 6 (six) hours as needed for moderate pain. 08/25/20  Yes Mcarthur Rossetti, MD  ibuprofen (ADVIL) 200 MG tablet Take 200 mg by mouth every 6 (six) hours as needed for moderate pain.   Yes [provider]  ipratropium-albuterol (DUONEB) 0.5-2.5 (3) MG/3ML SOLN Take 3 mLs by nebulization every 6 (six) hours as needed. Patient taking differently: Take 3 mLs by nebulization every 6 (six) hours as needed  (sob/wheezing). 12/29/19  Yes Young, Tarri Fuller D, MD  ketotifen (ZADITOR) 0.025 % ophthalmic solution Place 1 drop into both eyes 2 (two) times daily as needed (allergies).   Yes [provider]  levothyroxine (SYNTHROID) 50 MCG tablet TAKE ONE TABLET BY MOUTH DAILY BEFORE BREAKFAST Patient taking differently: Take 50 mcg by mouth at bedtime. 07/28/20  Yes Tower, Wynelle Fanny, MD  LORazepam (ATIVAN) 1 MG tablet Take 1 mg by mouth at bedtime as needed for anxiety or sleep. 06/28/20  Yes [provider]  mirabegron ER (MYRBETRIQ) 25 MG TB24 tablet Take 1 tablet (25 mg total) by mouth daily. Patient taking differently: Take 25 mg by mouth at bedtime. 07/08/20  Yes Tower, Wynelle Fanny, MD  mirtazapine (REMERON) 30 MG tablet Take 30 mg by mouth at bedtime.  07/22/15  Yes [provider]  ondansetron (ZOFRAN-ODT) 4 MG disintegrating tablet Take 4 mg by mouth every 8 (eight) hours as needed for nausea or vomiting.   Yes [provider]  predniSONE (DELTASONE) 50 MG tablet Take 1 tablet (50 mg total) by mouth daily with breakfast. 08/22/20  Yes Pete Pelt, PA-C  PREMARIN 0.625 MG tablet Take 1 tablet (0.625 mg total) by mouth daily. Take one tablet daily Patient taking differently: Take 0.625 mg by mouth daily. 08/24/19  Yes Guss Bunde, MD  sertraline (ZOLOFT) 100 MG tablet Take 100 mg by mouth daily.  12/06/14  Yes [provider]  simvastatin (ZOCOR) 20 MG tablet TAKE ONE TABLET BY MOUTH EVERY NIGHT AT BEDTIME 08/02/20  Yes Tower, Marne A, MD  Colchicine 0.6 MG CAPS Take 0.6 mg by mouth daily. Take one capsule  Every hour for three doses first day then one daily thereafter for one month. 08/18/20   Pete Pelt, PA-C    Allergies    Lithium; Tegretol [carbamazepine]; Tricyclic antidepressants; Strawberry extract; Codeine; Cymbalta [duloxetine hcl]; Erythromycin; Lyrica [pregabalin]; Neurontin [gabapentin]; Rabeprazole sodium; Duraprep [antiseptic products,  misc.];  Penicillins; and Tape  Review of Systems   Review of Systems  Constitutional:  Negative for chills, diaphoresis and fever.  HENT:  Negative for rhinorrhea and sore throat.   Eyes:  Negative for discharge and redness.  Respiratory:  Negative for cough and shortness of breath.   Cardiovascular:  Negative for chest pain and leg swelling.  Gastrointestinal:  Negative for abdominal distention, abdominal pain, constipation, diarrhea, nausea and vomiting.  Genitourinary:  Negative for dysuria and hematuria.  Musculoskeletal:  Positive for arthralgias and joint swelling.       R wrist pain/swell  Skin:  Negative for rash and wound.  Neurological:  Negative for syncope and headaches.  Psychiatric/Behavioral:  Negative for agitation and confusion.    Physical Exam Updated Vital Signs BP (!) 167/79 (BP Location: Right Arm)   Pulse 78   Temp 98.8 F (37.1 C) (Oral)   Resp 18   Ht '5\' 6"'  (1.676 m)   Wt 88 kg   SpO2 93%   BMI 31.31 kg/m   Physical Exam Vitals and nursing note reviewed.  Constitutional:      Appearance: She is not toxic-appearing.  HENT:     Head: Normocephalic and atraumatic.     Right Ear: External ear normal.     Left Ear: External ear normal.     Nose: Nose normal.     Mouth/Throat:     Mouth: Mucous membranes are moist.     Pharynx: Oropharynx is clear.  Eyes:     Extraocular Movements: Extraocular movements intact.     Conjunctiva/sclera: Conjunctivae normal.     Pupils: Pupils are equal, round, and reactive to light.  Cardiovascular:     Rate and Rhythm: Normal rate and regular rhythm.     Heart sounds: No murmur heard.   No friction rub. No gallop.  Pulmonary:     Breath sounds: No stridor. No wheezing, rhonchi or rales.  Abdominal:     General: There is no distension.     Palpations: Abdomen is soft.     Tenderness: There is no abdominal tenderness. There is no guarding or rebound.  Musculoskeletal:        General: No signs of injury.     Cervical  back: Normal range of motion and neck supple. No rigidity.     Right lower leg: No edema.     Left lower leg: No edema.     Comments: R wrist w/ overlying effusion/edema, warm to touch. Significant TTP over wrist diffusely extending to thenar eminence, also w/ passive flexion of all five digits. Some decreased sensation to LT over R thumb, otherwise good sensation to LT distally. Radial pulse 2+, good cap refill in all five digits. No streaking erythema, induration, or purulence.  Skin:    General: Skin is warm and dry.     Capillary Refill: Capillary refill takes less than 2 seconds.  Neurological:     Mental Status: She is alert and oriented to person, place, and time. Mental status is at baseline.  Psychiatric:        Mood and Affect: Mood normal.        Behavior: Behavior normal.    ED Results / Procedures / Treatments   Labs (all labs ordered are listed, but only abnormal results are displayed) Labs Reviewed  SEDIMENTATION RATE - Abnormal; Notable for the following components:      Result Value   Sed Rate 37 (*)    All other components  within normal limits  C-REACTIVE PROTEIN - Abnormal; Notable for the following components:   CRP 14.2 (*)    All other components within normal limits  CBC WITH DIFFERENTIAL/PLATELET - Abnormal; Notable for the following components:   RBC 3.65 (*)    Hemoglobin 11.5 (*)    HCT 35.4 (*)    All other components within normal limits  COMPREHENSIVE METABOLIC PANEL - Abnormal; Notable for the following components:   Glucose, Bld 128 (*)    BUN 29 (*)    Total Protein 6.4 (*)    Albumin 2.9 (*)    All other components within normal limits  CULTURE, BLOOD (ROUTINE X 2)  CULTURE, BLOOD (ROUTINE X 2)  CBC WITH DIFFERENTIAL/PLATELET  URIC ACID  HIV ANTIBODY (ROUTINE TESTING W REFLEX)  COMPREHENSIVE METABOLIC PANEL  CBC WITH DIFFERENTIAL/PLATELET  APTT  PROTIME-INR  C-REACTIVE PROTEIN  HEMOGLOBIN A1C    EKG None  Radiology DG Wrist  Complete Right  Result Date: 08/28/2020 CLINICAL DATA:  Pain and swelling. Limited range of motion without pain. EXAM: RIGHT WRIST - COMPLETE 3+ VIEW COMPARISON:  08/18/2020 FINDINGS: There is no acute fracture. There is ulnar minus variance. There is well corticated fragmentation of the ulnar styloid region, consistent with remote injury. Degenerative changes are identified at the first carpometacarpal joint and radiocarpal joint. Degenerative changes of the interphalangeal joint of the thumb. IMPRESSION: Degenerative changes.  No evidence for acute  abnormality. Electronically Signed   By: Nolon Nations M.D.   On: 08/28/2020 15:07    Procedures Procedures   Medications Ordered in ED Medications  vancomycin (VANCOREADY) IVPB 1500 mg/300 mL (1,500 mg Intravenous New Bag/Given 08/29/20 0019)  simvastatin (ZOCOR) tablet 20 mg (20 mg Oral Given 08/29/20 0100)  buPROPion (WELLBUTRIN XL) 24 hr tablet 150 mg (has no administration in time range)  busPIRone (BUSPAR) tablet 15 mg (has no administration in time range)  LORazepam (ATIVAN) tablet 1 mg (has no administration in time range)  mirtazapine (REMERON) tablet 30 mg (30 mg Oral Given 08/29/20 0101)  levothyroxine (SYNTHROID) tablet 50 mcg (50 mcg Oral Given 08/29/20 0100)  mirabegron ER (MYRBETRIQ) tablet 25 mg (25 mg Oral Given 08/29/20 0101)  divalproex (DEPAKOTE ER) 24 hr tablet 500 mg (500 mg Oral Given 08/29/20 0102)  albuterol (PROVENTIL) (2.5 MG/3ML) 0.083% nebulizer solution 2.5 mg (has no administration in time range)  fluticasone (FLONASE) 50 MCG/ACT nasal spray 2 spray (has no administration in time range)  ipratropium-albuterol (DUONEB) 0.5-2.5 (3) MG/3ML nebulizer solution 3 mL (has no administration in time range)  ketotifen (ZADITOR) 0.025 % ophthalmic solution 1 drop (has no administration in time range)  enoxaparin (LOVENOX) injection 40 mg (has no administration in time range)  acetaminophen (TYLENOL) tablet 650 mg (has no administration  in time range)    Or  acetaminophen (TYLENOL) suppository 650 mg (has no administration in time range)  polyethylene glycol (MIRALAX / GLYCOLAX) packet 17 g (has no administration in time range)  ondansetron (ZOFRAN) tablet 4 mg (has no administration in time range)    Or  ondansetron (ZOFRAN) injection 4 mg (has no administration in time range)  HYDROmorphone (DILAUDID) injection 0.5 mg ( Intravenous See Alternative 08/29/20 0123)    Or  HYDROmorphone (DILAUDID) injection 1 mg (1 mg Intravenous Given 08/29/20 0123)  hydrALAZINE (APRESOLINE) injection 10 mg (has no administration in time range)  lactated ringers infusion ( Intravenous New Bag/Given 08/29/20 0108)  ceFEPIme (MAXIPIME) 2 g in sodium chloride 0.9 % 100 mL IVPB (has  no administration in time range)  vancomycin (VANCOREADY) IVPB 1000 mg/200 mL (has no administration in time range)  HYDROmorphone (DILAUDID) injection 1 mg (1 mg Intravenous Given 08/28/20 1821)  ceFEPIme (MAXIPIME) 2 g in sodium chloride 0.9 % 100 mL IVPB (0 g Intravenous Stopped 08/29/20 0107)    ED Course  I have reviewed the triage vital signs and the nursing notes.  Pertinent labs & imaging results that were available during my care of the patient were reviewed by me and considered in my medical decision making (see chart for details).    MDM Rules/Calculators/A&P                          This is a 66yoF w/ hx and pe as above. Briefly, x3wk R wrist pain/swelling/warmth, seen by ortho (Dr. Ninfa Linden), s/p arthrocentesis w/ elevated WBC but no microorganisms. Started on doxy + prednisone, w/ worsening pain. On exam, warmth, swelling and TTP over R wrist and w/ passive flexion of digits. Decreased sensation over R thumb, o/w NVI. No signs of systemic infection.  Initial interventions: dilaudid w/ some improvement of pain  Ddx included: septic arthritis, osteoarthritis, gout, pseudogout, rheumatoid arthritis, trauma  All studies independently reviewed by myself, d/w  the attending physician, factored into my mdm. -XR R wrist: degenerative changes, no acute abnormality -CRP 14.2 -ESR 37 -unremarkable: cbcd, cmp  Presentation most concerning for septic arthritis. Less likely osteoarthritis given focality, severity of pain. Less likely pseudogout/gout given no prior hx similar sx. Inconsistent w/ RA, no other common co-presenting sx, or hx autoimmune disease. No hx trauma.  D/w Dr. Ninfa Linden, who recommended admit for IV abx and reassessment tomorrow. D/w hospitalist service who agreed to admit. Pt/friend understand and agree.  Pt HDS on reevaluation and care transferred to admitting service.  Final Clinical Impression(s) / ED Diagnoses Final diagnoses:  Wrist pain, right    Rx / DC Orders ED Discharge Orders     None        Levin Bacon, MD 08/29/20 0123    Margette Fast, MD 08/30/20 [phone]

## 2020-08-29 ENCOUNTER — Other Ambulatory Visit: Payer: Self-pay | Admitting: Specialist

## 2020-08-29 DIAGNOSIS — I10 Essential (primary) hypertension: Secondary | ICD-10-CM | POA: Diagnosis not present

## 2020-08-29 DIAGNOSIS — E039 Hypothyroidism, unspecified: Secondary | ICD-10-CM | POA: Diagnosis not present

## 2020-08-29 DIAGNOSIS — J449 Chronic obstructive pulmonary disease, unspecified: Secondary | ICD-10-CM | POA: Diagnosis not present

## 2020-08-29 DIAGNOSIS — M25531 Pain in right wrist: Secondary | ICD-10-CM | POA: Diagnosis not present

## 2020-08-29 LAB — CBC WITH DIFFERENTIAL/PLATELET
Abs Immature Granulocytes: 0.06 10*3/uL (ref 0.00–0.07)
Basophils Absolute: 0 10*3/uL (ref 0.0–0.1)
Basophils Relative: 0 %
Eosinophils Absolute: 0 10*3/uL (ref 0.0–0.5)
Eosinophils Relative: 0 %
HCT: 33.3 % — ABNORMAL LOW (ref 36.0–46.0)
Hemoglobin: 10.8 g/dL — ABNORMAL LOW (ref 12.0–15.0)
Immature Granulocytes: 1 %
Lymphocytes Relative: 27 %
Lymphs Abs: 2.8 10*3/uL (ref 0.7–4.0)
MCH: 31.8 pg (ref 26.0–34.0)
MCHC: 32.4 g/dL (ref 30.0–36.0)
MCV: 97.9 fL (ref 80.0–100.0)
Monocytes Absolute: 0.9 10*3/uL (ref 0.1–1.0)
Monocytes Relative: 8 %
Neutro Abs: 6.7 10*3/uL (ref 1.7–7.7)
Neutrophils Relative %: 64 %
Platelets: 306 10*3/uL (ref 150–400)
RBC: 3.4 MIL/uL — ABNORMAL LOW (ref 3.87–5.11)
RDW: 13.9 % (ref 11.5–15.5)
WBC: 10.4 10*3/uL (ref 4.0–10.5)
nRBC: 0 % (ref 0.0–0.2)

## 2020-08-29 LAB — APTT: aPTT: 28 seconds (ref 24–36)

## 2020-08-29 LAB — COMPREHENSIVE METABOLIC PANEL
ALT: 13 U/L (ref 0–44)
AST: 11 U/L — ABNORMAL LOW (ref 15–41)
Albumin: 2.7 g/dL — ABNORMAL LOW (ref 3.5–5.0)
Alkaline Phosphatase: 59 U/L (ref 38–126)
Anion gap: 9 (ref 5–15)
BUN: 29 mg/dL — ABNORMAL HIGH (ref 8–23)
CO2: 27 mmol/L (ref 22–32)
Calcium: 8.9 mg/dL (ref 8.9–10.3)
Chloride: 100 mmol/L (ref 98–111)
Creatinine, Ser: 0.84 mg/dL (ref 0.44–1.00)
GFR, Estimated: >60 mL/min (ref >60)
Glucose, Bld: 98 mg/dL (ref 70–99)
Potassium: 4.1 mmol/L (ref 3.5–5.1)
Sodium: 136 mmol/L (ref 135–145)
Total Bilirubin: 0.6 mg/dL (ref 0.3–1.2)
Total Protein: 5.8 g/dL — ABNORMAL LOW (ref 6.5–8.1)

## 2020-08-29 LAB — URINALYSIS, COMPLETE (UACMP) WITH MICROSCOPIC
Bacteria, UA: NONE SEEN
Bilirubin Urine: NEGATIVE
Glucose, UA: NEGATIVE mg/dL
Hgb urine dipstick: NEGATIVE
Ketones, ur: 5 mg/dL — AB
Leukocytes,Ua: NEGATIVE
Nitrite: NEGATIVE
Protein, ur: NEGATIVE mg/dL
Specific Gravity, Urine: 1.028 (ref 1.005–1.030)
pH: 5 (ref 5.0–8.0)

## 2020-08-29 LAB — RESP PANEL BY RT-PCR (FLU A&B, COVID) ARPGX2
Influenza A by PCR: NEGATIVE
Influenza B by PCR: NEGATIVE
SARS Coronavirus 2 by RT PCR: NEGATIVE

## 2020-08-29 LAB — HEMOGLOBIN A1C
Hgb A1c MFr Bld: 5.5 % (ref 4.8–5.6)
Mean Plasma Glucose: 111.15 mg/dL

## 2020-08-29 LAB — PROTIME-INR
INR: 1 (ref 0.8–1.2)
Prothrombin Time: 13 seconds (ref 11.4–15.2)

## 2020-08-29 LAB — URIC ACID: Uric Acid, Serum: 4.9 mg/dL (ref 2.5–7.1)

## 2020-08-29 LAB — C-REACTIVE PROTEIN: CRP: 14.1 mg/dL — ABNORMAL HIGH (ref <1.0)

## 2020-08-29 LAB — HIV ANTIBODY (ROUTINE TESTING W REFLEX): HIV Screen 4th Generation wRfx: NONREACTIVE

## 2020-08-29 MED ORDER — KETOROLAC TROMETHAMINE 15 MG/ML IJ SOLN
15.0000 mg | Freq: Four times a day (QID) | INTRAMUSCULAR | Status: AC
  Administered 2020-08-29 (×3): 15 mg via INTRAVENOUS
  Filled 2020-08-29 (×3): qty 1

## 2020-08-29 MED ORDER — SODIUM CHLORIDE 0.9 % IV SOLN
2.0000 g | Freq: Three times a day (TID) | INTRAVENOUS | Status: DC
  Administered 2020-08-29 – 2020-08-31 (×8): 2 g via INTRAVENOUS
  Filled 2020-08-29 (×8): qty 2

## 2020-08-29 MED ORDER — KETOROLAC TROMETHAMINE 15 MG/ML IJ SOLN
15.0000 mg | Freq: Four times a day (QID) | INTRAMUSCULAR | Status: AC | PRN
  Administered 2020-08-30 – 2020-09-02 (×6): 15 mg via INTRAVENOUS
  Filled 2020-08-29 (×6): qty 1

## 2020-08-29 MED ORDER — PANTOPRAZOLE SODIUM 40 MG PO TBEC
40.0000 mg | DELAYED_RELEASE_TABLET | Freq: Two times a day (BID) | ORAL | Status: DC
  Administered 2020-08-29 – 2020-09-07 (×18): 40 mg via ORAL
  Filled 2020-08-29 (×6): qty 1
  Filled 2020-08-29: qty 2
  Filled 2020-08-29 (×7): qty 1
  Filled 2020-08-29: qty 2
  Filled 2020-08-29 (×3): qty 1

## 2020-08-29 MED ORDER — VANCOMYCIN HCL 1000 MG/200ML IV SOLN
1000.0000 mg | INTRAVENOUS | Status: DC
  Administered 2020-08-29: 1000 mg via INTRAVENOUS
  Filled 2020-08-29 (×3): qty 200

## 2020-08-29 NOTE — Progress Notes (Signed)
     Subjective:   Left reverse total shoulder by Dr. August Saucer in December, for severe pain associated with cuff arthropathy. She has been experiencing acute onset right wrist pain, aspiration negative for organisms, swelling, no injury, has COPD history, [employer] 4/9 then a facet injection by Dr. Alvester Morin 4/19. Tehn 6/17 bladder irritation 6/17 and this was followed by onset of right wrist pain within one week. Amylodipine was stopped due to concern of side effect.. Patient reports pain as marked.    Objective:   VITALS:  Temp:  [97.6 F (36.4 C)-98.8 F (37.1 C)] 97.6 F (36.4 C) (07/04 0953) Pulse Rate:  [78-91] 79 (07/04 0953) Resp:  [14-23] 17 (07/04 0953) BP: (124-184)/(51-118) 184/77 (07/04 0953) SpO2:  [88 %-100 %] 100 % (07/04 0953) Weight:  [88 kg] 88 kg (07/04 0122)  Neurologically intact ABD soft Neurovascular intact Sensation intact distally Intact pulses distally Swelling right hand and wrist, suspected reaction to amylodipine 2 weeks ago but symptoms are suspicious for UTI. No antibiotic treatment   LABS Recent Labs    08/28/20 2033 08/29/20 0412  HGB 11.5* 10.8*  WBC 7.8 10.4  PLT 327 306   Recent Labs    08/28/20 2033 08/29/20 0412  NA 135 136  K 4.6 4.1  CL 99 100  CO2 28 27  BUN 29* 29*  CREATININE 0.97 0.84  GLUCOSE 128* 98   Recent Labs    08/29/20 0412  INR 1.0     Assessment/Plan:   Right wrist pain inflamation vs infection. Dr. [name] plans follow up and decision at that time.  Hold NPO till seen by Dr. [name].  Vira Browns 08/29/2020, 11:09 AM Patient ID: Lori Orozco, female   DOB: Dec 16, 1950, 70 y.o.   MRN: [id number]

## 2020-08-29 NOTE — ED Notes (Signed)
Received verbal report from Chris C RN at this time ?

## 2020-08-29 NOTE — Progress Notes (Signed)
Patient ID: Lori Orozco, female   DOB: [birth year], 70 y.o.   MRN: [id number] I examined the patient at the bedside.  I am very familiar with her.  The aspiration I did of her wrist a week ago showed high PMNs instilled no organisms in the cultures have been negative thus far.  This may be a bed inflammatory process.  She has been wearing her right wrist splint and I took this off and will to keep it off.  On inspection of the left wrist, there is some fullness of the soft tissues over the extensor compartment.  There is some slight warmth but there is no redness at all and there is no fluctuance.  The skin is wrinkling and it does show that some of the swelling has decreased from the last time I saw her.  Today, I will let her eat a regular diet.  I would like to order a MRI of her right wrist to look for any type of fluid collections or other worrisome findings that would warrant a surgical intervention.  I have instructed nursing to have the wrist elevated and iced.  Hopefully the MRI can be obtained today and I will see her again in the morning tomorrow to make a determination of whether or not surgery is indicated.

## 2020-08-29 NOTE — ED Notes (Signed)
Pt moved to room 40 

## 2020-08-29 NOTE — ED Notes (Signed)
Pt placed on 2lpm O2 due to decrease in O2 sats when sleeping. Provider made aware of same

## 2020-08-29 NOTE — Progress Notes (Signed)
[facility] PROGRESS NOTE    Progress Note  Lori Orozco  KDX:[id number] DOB: 05/21/1950 DOA: 08/28/2020 PCP: Abner Greenspan, MD     Brief Narrative:   Lori Orozco is an 70 y.o. female past medical history of hypothyroidism, hyperlipidemia essential hypertension comes into the ED for wrist pain erythema and swelling orthopedic surgery was consulted who started empirically on IV vancomycin and cefepime and IV fluid hydration.  Orthopedic surgery had seen her a week ago and perform arthrocentesis cultures have shown no growth, in the ED sed rate and CRP are elevated.  Crystals could not be performed on the previous arthrocentesis    Assessment/Plan:   Pain and swelling of right wrist X-ray showed no evidence of bony destruction has remained afebrile, no leukocytosis or left shift, ESR and CRP are elevated. Blood cultures have been ordered. She was previously on steroids and empiric antibiotics, with no improvement, was started on IV ketorolac Awaiting orthopedic surgery further recommendations.  Generalized anxiety disorder Continue current home regimen.  COPD (chronic obstructive pulmonary disease) ([place]) Noted stable continue inhalers.  Hypothyroidism Continue Synthroid.  Essential hypertension She does not want to take any medications.   DVT prophylaxis: lovenox Family Communication:none Status is: Observation  The patient remains OBS appropriate and will d/c before 2 midnights.  Dispo: The patient is from: Home              Anticipated d/c is to: Home              Patient currently is not medically stable to d/c.   Difficult to place patient No   Code Status:     Code Status Orders  (From admission, onward)           Start     Ordered   08/28/20 2327  Full code  Continuous        08/28/20 2329           Code Status History     Date Active Date Inactive Code Status Order ID Comments User Context   02/02/2020 1925 02/03/2020 1759  Full Code [id number]  Rosalin Hawking Inpatient   01/28/2015 1748 01/30/2015 1553 Full Code [id number]  Mcarthur Rossetti, MD Inpatient   03/20/2013 1652 03/23/2013 1852 Full Code [id number]  Newt Minion, MD Inpatient         IV Access:   Peripheral IV   Procedures and diagnostic studies:   DG Wrist Complete Right  Result Date: 08/28/2020 CLINICAL DATA:  Pain and swelling. Limited range of motion without pain. EXAM: RIGHT WRIST - COMPLETE 3+ VIEW COMPARISON:  08/18/2020 FINDINGS: There is no acute fracture. There is ulnar minus variance. There is well corticated fragmentation of the ulnar styloid region, consistent with remote injury. Degenerative changes are identified at the first carpometacarpal joint and radiocarpal joint. Degenerative changes of the interphalangeal joint of the thumb. IMPRESSION: Degenerative changes.  No evidence for acute  abnormality. Electronically Signed   By: Nolon Nations M.D.   On: 08/28/2020 15:07     Medical Consultants:   None.   Subjective:    Lori Orozco continues to have significant pain.  Objective:    Vitals:   08/29/20 0445 08/29/20 0515 08/29/20 0615 08/29/20 0645  BP: (!) 163/65 139/83 (!) 167/74 (!) 155/76  Pulse: 79 85 82 82  Resp: (!) 22 (!) _0 Temp:      TempSrc:      SpO2: 92% 97%  95% 93%  Weight:      Height:       SpO2: 93 %   Intake/Output Summary (Last 24 hours) at 08/29/2020 0731 Last data filed at 08/29/2020 0220 Gross per 24 hour  Intake 552.5 ml  Output --  Net 552.5 ml   Filed Weights   08/29/20 0122  Weight: 88 kg    Exam: General exam: In no acute distress. Respiratory system: Good air movement and clear to auscultation. Cardiovascular system: S1 & S2 heard, RRR. No JVD. Gastrointestinal system: Abdomen is nondistended, soft and nontender.  Extremities: No pedal edema. Skin: No rashes, lesions or ulcers   Data Reviewed:    Labs: Basic Metabolic Panel: Recent Labs   Lab 08/28/20 2033 08/29/20 0412  NA 135 136  K 4.6 4.1  CL 99 100  CO2 28 27  GLUCOSE 128* 98  BUN 29* 29*  CREATININE 0.97 0.84  CALCIUM 9.3 8.9   GFR Estimated Creatinine Clearance: 69.7 mL/min (by C-G formula based on SCr of 0.84 mg/dL). Liver Function Tests: Recent Labs  Lab 08/28/20 2033 08/29/20 0412  AST 16 11*  ALT 14 13  ALKPHOS 63 59  BILITOT 0.4 0.6  PROT 6.4* 5.8*  ALBUMIN 2.9* 2.7*   No results for input(s): LIPASE, AMYLASE in the last 168 hours. No results for input(s): AMMONIA in the last 168 hours. Coagulation profile Recent Labs  Lab 08/29/20 0412  INR 1.0   COVID-19 Labs  Recent Labs    08/28/20 1815 08/29/20 0412  CRP 14.2* 14.1*    Lab Results  Component Value Date   SARSCOV2NAA NEGATIVE 08/29/2020   SARSCOV2NAA NEGATIVE 01/28/2020   SARSCOV2NAA Not Detected 12/02/2019   SARSCOV2NAA Detected (A) 03/16/2019    CBC: Recent Labs  Lab 08/28/20 2033 08/29/20 0412  WBC 7.8 10.4  NEUTROABS 6.3 6.7  HGB 11.5* 10.8*  HCT 35.4* 33.3*  MCV 97.0 97.9  PLT 327 306   Cardiac Enzymes: No results for input(s): CKTOTAL, CKMB, CKMBINDEX, TROPONINI in the last 168 hours. BNP (last 3 results) No results for input(s): PROBNP in the last 8760 hours. CBG: No results for input(s): GLUCAP in the last 168 hours. D-Dimer: No results for input(s): DDIMER in the last 72 hours. Hgb A1c: Recent Labs    08/29/20 0412  HGBA1C 5.5   Lipid Profile: No results for input(s): CHOL, HDL, LDLCALC, TRIG, CHOLHDL, LDLDIRECT in the last 72 hours. Thyroid function studies: No results for input(s): TSH, T4TOTAL, T3FREE, THYROIDAB in the last 72 hours.  Invalid input(s): FREET3 Anemia work up: No results for input(s): VITAMINB12, FOLATE, FERRITIN, TIBC, IRON, RETICCTPCT in the last 72 hours. Sepsis Labs: Recent Labs  Lab 08/28/20 2033 08/29/20 0412  WBC 7.8 10.4   Microbiology Recent Results (from the past 240 hour(s))  Body Fluid Culture      Status: None   Collection Time: 08/22/20  3:44 PM   Specimen: Synovial Fluid   Synovial Flu Wrist flu  Result Value Ref Range Status   Body Fluid Culture, Sterile Final report  Final   Organism ID, Bacteria Comment  Final    Comment: No growth in 56 - 72 hours.  Gram stain     Status: None   Collection Time: 08/22/20  3:44 PM  Result Value Ref Range Status   MICRO NUMBER: [id number]  Final   SPECIMEN QUALITY: Adequate  Final   Source SYNOVIAL FLUID  Final   STATUS: FINAL  Final   GRAM STAIN:   Final  Many Polymorphonuclear leukocytes No organisms seen  Resp Panel by RT-PCR (Flu A&B, Covid) Nasopharyngeal Swab     Status: None   Collection Time: 08/29/20  5:55 AM   Specimen: Nasopharyngeal Swab; Nasopharyngeal(NP) swabs in vial transport medium  Result Value Ref Range Status   SARS Coronavirus 2 by RT PCR NEGATIVE NEGATIVE Final    Comment: (NOTE) SARS-CoV-2 target nucleic acids are NOT DETECTED.  The SARS-CoV-2 RNA is generally detectable in upper respiratory specimens during the acute phase of infection. The lowest concentration of SARS-CoV-2 viral copies this assay can detect is 138 copies/mL. A negative result does not preclude SARS-Cov-2 infection and should not be used as the sole basis for treatment or other patient management decisions. A negative result may occur with  improper specimen collection/handling, submission of specimen other than nasopharyngeal swab, presence of viral mutation(s) within the areas targeted by this assay, and inadequate number of viral copies(<138 copies/mL). A negative result must be combined with clinical observations, patient history, and epidemiological information. The expected result is Negative.  Fact Sheet for Patients:  EntrepreneurPulse.com.au  Fact Sheet for Healthcare Providers:  IncredibleEmployment.be  This test is no t yet approved or cleared by the [place] FDA and  has been  authorized for detection and/or diagnosis of SARS-CoV-2 by FDA under an Emergency Use Authorization (EUA). This EUA will remain  in effect (meaning this test can be used) for the duration of the COVID-19 declaration under Section 564(b)(1) of the Act, 21 U.S.C.section 360bbb-3(b)(1), unless the authorization is terminated  or revoked sooner.       Influenza A by PCR NEGATIVE NEGATIVE Final   Influenza B by PCR NEGATIVE NEGATIVE Final    Comment: (NOTE) The Xpert Xpress SARS-CoV-2/FLU/RSV plus assay is intended as an aid in the diagnosis of influenza from Nasopharyngeal swab specimens and should not be used as a sole basis for treatment. Nasal washings and aspirates are unacceptable for Xpert Xpress SARS-CoV-2/FLU/RSV testing.  Fact Sheet for Patients: EntrepreneurPulse.com.au  Fact Sheet for Healthcare Providers: IncredibleEmployment.be  This test is not yet approved or cleared by the [place] FDA and has been authorized for detection and/or diagnosis of SARS-CoV-2 by FDA under an Emergency Use Authorization (EUA). This EUA will remain in effect (meaning this test can be used) for the duration of the COVID-19 declaration under Section 564(b)(1) of the Act, 21 U.S.C. section 360bbb-3(b)(1), unless the authorization is terminated or revoked.  Performed at [facility], [place] [address] [place], [place] [address]      Medications:    buPROPion  150 mg Oral Daily   busPIRone  15 mg Oral BID AC   divalproex  500 mg Oral QHS   enoxaparin (LOVENOX) injection  40 mg Subcutaneous Daily   levothyroxine  50 mcg Oral QHS   mirabegron ER  25 mg Oral QHS   mirtazapine  30 mg Oral QHS   simvastatin  20 mg Oral QHS   Continuous Infusions:  ceFEPime (MAXIPIME) IV 2 g (08/29/20 0557)   lactated ringers 125 mL/hr at 08/29/20 0108   vancomycin        LOS: 0 days   Charlynne Cousins  [facility]  08/29/2020, 7:31 AM

## 2020-08-29 NOTE — ED Notes (Signed)
RN on 5N will call me back after giving patient medication.

## 2020-08-29 NOTE — Progress Notes (Signed)
Pharmacy Antibiotic Note  Lori Orozco is a 70 y.o. female admitted on 08/28/2020 with  rule out infected wrist joint .  Pharmacy has been consulted for Vancomycin/Cefepime dosing. WBC WNL. Renal function ok. Had joint aspirated by ortho about a week ago.   Plan: Vancomycin 1500 mg IV x 1, then 1000 mg IV q24h >>Estimated AUC: 485 Cefepime 2g IV q8h Trend WBC, temp, renal function  F/U infectious work-up Drug levels as indicated  Temp (24hrs), Avg:98.2 F (36.8 C), Min:97.8 F (36.6 C), Max:98.6 F (37 C)  Recent Labs  Lab 08/28/20 2033  WBC 7.8  CREATININE 0.97    CrCl cannot be calculated (Unknown ideal weight.).    Allergies  Allergen Reactions   Lithium Anaphylaxis   Tegretol [Carbamazepine] Other (See Comments)    Fever and body aches (fever over 103)   Tricyclic Antidepressants Anaphylaxis   Strawberry Extract Hives   Codeine Nausea Only    Makes pt stay awake   Cymbalta [Duloxetine Hcl] Other (See Comments)    Makes pt pass out    Erythromycin     abdominal pain   Lyrica [Pregabalin]     Felt faint   Neurontin [Gabapentin]     Passes  out   Rabeprazole Sodium     insomnia   Duraprep [Antiseptic Products, Misc.] Itching and Rash    Tolerates Betadine    Penicillins Rash    Tolerated ANCEF 02/02/20  Has patient had a PCN reaction causing immediate rash, facial/tongue/throat swelling, SOB or lightheadedness with hypotension: Yes Has patient had a PCN reaction causing severe rash involving mucus membranes or skin necrosis: No Has patient had a PCN reaction that required hospitalization No Has patient had a PCN reaction occurring within the last 10 years: No If all of the above answers are "NO", then may proceed with Cephalosporin use.    Tape Rash    PT ALLERGIC NYLON TAPE     Abran Duke, PharmD, BCPS Clinical Pharmacist Phone: [phone]

## 2020-08-30 ENCOUNTER — Observation Stay (HOSPITAL_COMMUNITY): Payer: Medicare Other

## 2020-08-30 DIAGNOSIS — J439 Emphysema, unspecified: Secondary | ICD-10-CM | POA: Diagnosis not present

## 2020-08-30 DIAGNOSIS — Z7952 Long term (current) use of systemic steroids: Secondary | ICD-10-CM | POA: Diagnosis not present

## 2020-08-30 DIAGNOSIS — E039 Hypothyroidism, unspecified: Secondary | ICD-10-CM | POA: Diagnosis not present

## 2020-08-30 DIAGNOSIS — M25531 Pain in right wrist: Secondary | ICD-10-CM | POA: Diagnosis present

## 2020-08-30 DIAGNOSIS — M25431 Effusion, right wrist: Secondary | ICD-10-CM | POA: Diagnosis not present

## 2020-08-30 DIAGNOSIS — F411 Generalized anxiety disorder: Secondary | ICD-10-CM | POA: Diagnosis not present

## 2020-08-30 DIAGNOSIS — Z96612 Presence of left artificial shoulder joint: Secondary | ICD-10-CM | POA: Diagnosis present

## 2020-08-30 DIAGNOSIS — E785 Hyperlipidemia, unspecified: Secondary | ICD-10-CM | POA: Diagnosis not present

## 2020-08-30 DIAGNOSIS — Z8616 Personal history of COVID-19: Secondary | ICD-10-CM | POA: Diagnosis not present

## 2020-08-30 DIAGNOSIS — Z800 Family history of malignant neoplasm of digestive organs: Secondary | ICD-10-CM | POA: Diagnosis not present

## 2020-08-30 DIAGNOSIS — Z87891 Personal history of nicotine dependence: Secondary | ICD-10-CM | POA: Diagnosis not present

## 2020-08-30 DIAGNOSIS — K219 Gastro-esophageal reflux disease without esophagitis: Secondary | ICD-10-CM | POA: Diagnosis not present

## 2020-08-30 DIAGNOSIS — Z79899 Other long term (current) drug therapy: Secondary | ICD-10-CM | POA: Diagnosis not present

## 2020-08-30 DIAGNOSIS — M65831 Other synovitis and tenosynovitis, right forearm: Secondary | ICD-10-CM | POA: Diagnosis not present

## 2020-08-30 DIAGNOSIS — Z96643 Presence of artificial hip joint, bilateral: Secondary | ICD-10-CM | POA: Diagnosis not present

## 2020-08-30 DIAGNOSIS — F32A Depression, unspecified: Secondary | ICD-10-CM | POA: Diagnosis not present

## 2020-08-30 DIAGNOSIS — Z8249 Family history of ischemic heart disease and other diseases of the circulatory system: Secondary | ICD-10-CM | POA: Diagnosis not present

## 2020-08-30 DIAGNOSIS — M25539 Pain in unspecified wrist: Secondary | ICD-10-CM | POA: Diagnosis present

## 2020-08-30 DIAGNOSIS — M25561 Pain in right knee: Secondary | ICD-10-CM | POA: Diagnosis not present

## 2020-08-30 DIAGNOSIS — I129 Hypertensive chronic kidney disease with stage 1 through stage 4 chronic kidney disease, or unspecified chronic kidney disease: Secondary | ICD-10-CM | POA: Diagnosis not present

## 2020-08-30 DIAGNOSIS — Z79890 Hormone replacement therapy (postmenopausal): Secondary | ICD-10-CM | POA: Diagnosis not present

## 2020-08-30 DIAGNOSIS — I10 Essential (primary) hypertension: Secondary | ICD-10-CM | POA: Diagnosis not present

## 2020-08-30 DIAGNOSIS — Z90710 Acquired absence of both cervix and uterus: Secondary | ICD-10-CM | POA: Diagnosis not present

## 2020-08-30 DIAGNOSIS — M797 Fibromyalgia: Secondary | ICD-10-CM | POA: Diagnosis not present

## 2020-08-30 DIAGNOSIS — J449 Chronic obstructive pulmonary disease, unspecified: Secondary | ICD-10-CM | POA: Diagnosis not present

## 2020-08-30 LAB — URINE CULTURE: Culture: NO GROWTH

## 2020-08-30 MED ORDER — VANCOMYCIN HCL IN DEXTROSE 1-5 GM/200ML-% IV SOLN
1000.0000 mg | INTRAVENOUS | Status: DC
  Administered 2020-08-30 – 2020-08-31 (×2): 1000 mg via INTRAVENOUS
  Filled 2020-08-30 (×2): qty 200

## 2020-08-30 MED ORDER — METHYLPREDNISOLONE SODIUM SUCC 125 MG IJ SOLR
60.0000 mg | Freq: Once | INTRAMUSCULAR | Status: AC
  Administered 2020-08-30: 60 mg via INTRAVENOUS
  Filled 2020-08-30 (×3): qty 0.96

## 2020-08-30 MED ORDER — VANCOMYCIN HCL 1000 MG/200ML IV SOLN: 1000.0000 mg | INTRAVENOUS | Status: DC

## 2020-08-30 MED ORDER — KETOROLAC TROMETHAMINE 15 MG/ML IJ SOLN
15.0000 mg | Freq: Once | INTRAMUSCULAR | Status: AC
  Administered 2020-08-30: 15 mg via INTRAVENOUS
  Filled 2020-08-30: qty 1

## 2020-08-30 MED ORDER — ESTROGENS CONJUGATED 0.625 MG PO TABS
0.6250 mg | ORAL_TABLET | Freq: Every day | ORAL | Status: DC
  Administered 2020-08-31 – 2020-09-07 (×8): 0.625 mg via ORAL
  Filled 2020-08-30 (×9): qty 1

## 2020-08-30 MED ORDER — SODIUM CHLORIDE 0.9 % IV SOLN
INTRAVENOUS | Status: DC | PRN
  Administered 2020-08-30: 250 mL via INTRAVENOUS

## 2020-08-30 MED ORDER — SERTRALINE HCL 100 MG PO TABS
100.0000 mg | ORAL_TABLET | Freq: Every day | ORAL | Status: DC
  Administered 2020-08-30 – 2020-09-07 (×9): 100 mg via ORAL
  Filled 2020-08-30 (×9): qty 1

## 2020-08-30 NOTE — Care Management Obs Status (Signed)
MEDICARE OBSERVATION STATUS NOTIFICATION   Patient Details  Name: LADAJAH SOLTYS MRN: [id number] Date of Birth: [birth year]   Medicare Observation Status Notification Given:  Yes    Lawerance Sabal, RN 08/30/2020, 9:18 AM

## 2020-08-30 NOTE — Progress Notes (Signed)
[facility] PROGRESS NOTE    Progress Note  [name]  RSW:[id number] DOB: 1950/08/14 DOA: 08/28/2020 PCP: Abner Greenspan, MD     Brief Narrative:   Lori Orozco is an 70 y.o. female past medical history of hypothyroidism, hyperlipidemia essential hypertension comes into the ED for wrist pain erythema and swelling orthopedic surgery was consulted who started empirically on IV vancomycin and cefepime and IV fluid hydration.  Orthopedic surgery had seen her a week ago and perform arthrocentesis cultures have shown no growth, in the ED sed rate and CRP are elevated.  Crystals could not be performed on the previous arthrocentesis    Assessment/Plan:   Pain and swelling of right wrist X-ray showed no evidence of bony destruction has remained afebrile, no leukocytosis or left shift, ESR and CRP are elevated. Blood cultures negative till date. MRI of the wrist is pending awaiting orthopedic surgery recommendations.  Generalized anxiety disorder Continue current home regimen.  COPD (chronic obstructive pulmonary disease) ([place]) Noted stable continue inhalers.  Hypothyroidism Continue Synthroid.  Essential hypertension She does not want to take any medications.   DVT prophylaxis: lovenox Family Communication:none Status is: Observation  The patient remains OBS appropriate and will d/c before 2 midnights.  Dispo: The patient is from: Home              Anticipated d/c is to: Home              Patient currently is not medically stable to d/c.   Difficult to place patient No   Code Status:     Code Status Orders  (From admission, onward)           Start     Ordered   08/28/20 2327  Full code  Continuous        08/28/20 2329           Code Status History     Date Active Date Inactive Code Status Order ID Comments User Context   02/02/2020 1925 02/03/2020 1759 Full Code [id number]  Rosalin Hawking Inpatient   01/28/2015 1748 01/30/2015  1553 Full Code [id number]  Mcarthur Rossetti, MD Inpatient   03/20/2013 1652 03/23/2013 1852 Full Code [id number]  Newt Minion, MD Inpatient         IV Access:   Peripheral IV   Procedures and diagnostic studies:   DG Wrist Complete Right  Result Date: 08/28/2020 CLINICAL DATA:  Pain and swelling. Limited range of motion without pain. EXAM: RIGHT WRIST - COMPLETE 3+ VIEW COMPARISON:  08/18/2020 FINDINGS: There is no acute fracture. There is ulnar minus variance. There is well corticated fragmentation of the ulnar styloid region, consistent with remote injury. Degenerative changes are identified at the first carpometacarpal joint and radiocarpal joint. Degenerative changes of the interphalangeal joint of the thumb. IMPRESSION: Degenerative changes.  No evidence for acute  abnormality. Electronically Signed   By: Nolon Nations M.D.   On: 08/28/2020 15:07     Medical Consultants:   None.   Subjective:    Lori Orozco continues to have significant pain.  Objective:    Vitals:   08/29/20 0953 08/29/20 1500 08/29/20 1938 08/30/20 0836  BP: (!) 184/77 (!) 154/65 (!) 141/61 (!) 164/65  Pulse: 79 82 89 96  Resp: '17 17 16 17  ' Temp: 97.6 F (36.4 C) 98 F (36.7 C) 98.3 F (36.8 C) 99.1 F (37.3 C)  TempSrc: Oral Oral Oral Oral  SpO2: 100% 94%  94% 96%  Weight:      Height:       SpO2: 96 %   Intake/Output Summary (Last 24 hours) at 08/30/2020 1056 Last data filed at 08/30/2020 0847 Gross per 24 hour  Intake --  Output 1350 ml  Net -1350 ml    Filed Weights   08/29/20 0122  Weight: 88 kg    Exam: General exam: In no acute distress. Respiratory system: Good air movement and clear to auscultation. Cardiovascular system: S1 & S2 heard, RRR. No JVD. Gastrointestinal system: Abdomen is nondistended, soft and nontender.  Extremities: Right wrist continues to be swollen and tender to touch with decreased mobility due to pain Skin: No rashes, lesions or  ulcers Psychiatry: Judgement and insight appear normal. Mood & affect appropriate.   Data Reviewed:    Labs: Basic Metabolic Panel: Recent Labs  Lab 08/28/20 2033 08/29/20 0412  NA 135 136  K 4.6 4.1  CL 99 100  CO2 28 27  GLUCOSE 128* 98  BUN 29* 29*  CREATININE 0.97 0.84  CALCIUM 9.3 8.9    GFR Estimated Creatinine Clearance: 69.7 mL/min (by C-G formula based on SCr of 0.84 mg/dL). Liver Function Tests: Recent Labs  Lab 08/28/20 2033 08/29/20 0412  AST 16 11*  ALT 14 13  ALKPHOS 63 59  BILITOT 0.4 0.6  PROT 6.4* 5.8*  ALBUMIN 2.9* 2.7*    No results for input(s): LIPASE, AMYLASE in the last 168 hours. No results for input(s): AMMONIA in the last 168 hours. Coagulation profile Recent Labs  Lab 08/29/20 0412  INR 1.0    COVID-19 Labs  Recent Labs    08/28/20 1815 08/29/20 0412  CRP 14.2* 14.1*     Lab Results  Component Value Date   SARSCOV2NAA NEGATIVE 08/29/2020   SARSCOV2NAA NEGATIVE 01/28/2020   SARSCOV2NAA Not Detected 12/02/2019   SARSCOV2NAA Detected (A) 03/16/2019    CBC: Recent Labs  Lab 08/28/20 2033 08/29/20 0412  WBC 7.8 10.4  NEUTROABS 6.3 6.7  HGB 11.5* 10.8*  HCT 35.4* 33.3*  MCV 97.0 97.9  PLT 327 306    Cardiac Enzymes: No results for input(s): CKTOTAL, CKMB, CKMBINDEX, TROPONINI in the last 168 hours. BNP (last 3 results) No results for input(s): PROBNP in the last 8760 hours. CBG: No results for input(s): GLUCAP in the last 168 hours. D-Dimer: No results for input(s): DDIMER in the last 72 hours. Hgb A1c: Recent Labs    08/29/20 0412  HGBA1C 5.5    Lipid Profile: No results for input(s): CHOL, HDL, LDLCALC, TRIG, CHOLHDL, LDLDIRECT in the last 72 hours. Thyroid function studies: No results for input(s): TSH, T4TOTAL, T3FREE, THYROIDAB in the last 72 hours.  Invalid input(s): FREET3 Anemia work up: No results for input(s): VITAMINB12, FOLATE, FERRITIN, TIBC, IRON, RETICCTPCT in the last 72  hours. Sepsis Labs: Recent Labs  Lab 08/28/20 2033 08/29/20 0412  WBC 7.8 10.4    Microbiology Recent Results (from the past 240 hour(s))  Body Fluid Culture     Status: None   Collection Time: 08/22/20  3:44 PM   Specimen: Synovial Fluid   Synovial Flu Wrist flu  Result Value Ref Range Status   Body Fluid Culture, Sterile Final report  Final   Organism ID, Bacteria Comment  Final    Comment: No growth in 56 - 72 hours.  Gram stain     Status: None   Collection Time: 08/22/20  3:44 PM  Result Value Ref Range Status   MICRO  NUMBER: [id number]  Final   SPECIMEN QUALITY: Adequate  Final   Source SYNOVIAL FLUID  Final   STATUS: FINAL  Final   GRAM STAIN:   Final    Many Polymorphonuclear leukocytes No organisms seen  Culture, blood (routine x 2)     Status: None (Preliminary result)   Collection Time: 08/29/20  1:10 AM   Specimen: BLOOD  Result Value Ref Range Status   Specimen Description BLOOD RIGHT ANTECUBITAL  Final   Special Requests   Final    BOTTLES DRAWN AEROBIC AND ANAEROBIC Blood Culture results may not be optimal due to an excessive volume of blood received in culture bottles   Culture   Final    NO GROWTH 1 DAY Performed at [facility], [place]. [address], [place], [place] [address]    Report Status PENDING  Incomplete  Culture, blood (routine x 2)     Status: None (Preliminary result)   Collection Time: 08/29/20  1:38 AM   Specimen: BLOOD LEFT HAND  Result Value Ref Range Status   Specimen Description BLOOD LEFT HAND  Final   Special Requests   Final    BOTTLES DRAWN AEROBIC AND ANAEROBIC Blood Culture results may not be optimal due to an excessive volume of blood received in culture bottles   Culture   Final    NO GROWTH 1 DAY Performed at [facility], [place] [address], [place], [place] [address]    Report Status PENDING  Incomplete  Culture, Urine     Status: None   Collection Time: 08/29/20  4:27 AM   Specimen: Urine, Clean Catch  Result Value  Ref Range Status   Specimen Description URINE, CLEAN CATCH  Final   Special Requests NONE  Final   Culture   Final    NO GROWTH Performed at [facility], [place] [address], [place], [place] [address]    Report Status 08/30/2020 FINAL  Final  Resp Panel by RT-PCR (Flu A&B, Covid) Nasopharyngeal Swab     Status: None   Collection Time: 08/29/20  5:55 AM   Specimen: Nasopharyngeal Swab; Nasopharyngeal(NP) swabs in vial transport medium  Result Value Ref Range Status   SARS Coronavirus 2 by RT PCR NEGATIVE NEGATIVE Final    Comment: (NOTE) SARS-CoV-2 target nucleic acids are NOT DETECTED.  The SARS-CoV-2 RNA is generally detectable in upper respiratory specimens during the acute phase of infection. The lowest concentration of SARS-CoV-2 viral copies this assay can detect is 138 copies/mL. A negative result does not preclude SARS-Cov-2 infection and should not be used as the sole basis for treatment or other patient management decisions. A negative result may occur with  improper specimen collection/handling, submission of specimen other than nasopharyngeal swab, presence of viral mutation(s) within the areas targeted by this assay, and inadequate number of viral copies(<138 copies/mL). A negative result must be combined with clinical observations, patient history, and epidemiological information. The expected result is Negative.  Fact Sheet for Patients:  EntrepreneurPulse.com.au  Fact Sheet for Healthcare Providers:  IncredibleEmployment.be  This test is no t yet approved or cleared by the [place] FDA and  has been authorized for detection and/or diagnosis of SARS-CoV-2 by FDA under an Emergency Use Authorization (EUA). This EUA will remain  in effect (meaning this test can be used) for the duration of the COVID-19 declaration under Section 564(b)(1) of the Act, 21 U.S.C.section 360bbb-3(b)(1), unless the authorization is terminated   or revoked sooner.  Influenza A by PCR NEGATIVE NEGATIVE Final   Influenza B by PCR NEGATIVE NEGATIVE Final    Comment: (NOTE) The Xpert Xpress SARS-CoV-2/FLU/RSV plus assay is intended as an aid in the diagnosis of influenza from Nasopharyngeal swab specimens and should not be used as a sole basis for treatment. Nasal washings and aspirates are unacceptable for Xpert Xpress SARS-CoV-2/FLU/RSV testing.  Fact Sheet for Patients: EntrepreneurPulse.com.au  Fact Sheet for Healthcare Providers: IncredibleEmployment.be  This test is not yet approved or cleared by the [place] FDA and has been authorized for detection and/or diagnosis of SARS-CoV-2 by FDA under an Emergency Use Authorization (EUA). This EUA will remain in effect (meaning this test can be used) for the duration of the COVID-19 declaration under Section 564(b)(1) of the Act, 21 U.S.C. section 360bbb-3(b)(1), unless the authorization is terminated or revoked.  Performed at [facility], [place] [address], [place], [place] [address]      Medications:    buPROPion  150 mg Oral Daily   busPIRone  15 mg Oral BID AC   divalproex  500 mg Oral QHS   enoxaparin (LOVENOX) injection  40 mg Subcutaneous Daily   estrogens (conjugated)  0.625 mg Oral Daily   levothyroxine  50 mcg Oral QHS   mirabegron ER  25 mg Oral QHS   mirtazapine  30 mg Oral QHS   pantoprazole  40 mg Oral BID   sertraline  100 mg Oral Daily   simvastatin  20 mg Oral QHS   Continuous Infusions:  ceFEPime (MAXIPIME) IV 2 g (08/30/20 0502)   vancomycin 1,000 mg (08/29/20 2212)      LOS: 0 days   Charlynne Cousins  [facility]  08/30/2020, 10:56 AM

## 2020-08-30 NOTE — Progress Notes (Signed)
Patient ID: Lori Orozco, female   DOB: 03/06/1950, 70 y.o.   MRN: [id number] I did review the patient's MRI of her right wrist.  Her findings are consistent with a tenosynovitis on MRI and on my clinical exam.  There is still no redness of her skin.  She is exquisitely tender over the extensor surface of the wrist.  The volar side is normal.  There is no area of fluctuance.  The swelling is more consistent of a tenosynovitis and possibly mild cellulitis.  I do not see a drainable abscess.  Use it with tenosynovitis to try to get it to calm down with anti-inflammatories.  I am still not opposed to her being on antibiotics based on how painful things are for her.  The cultures from the fluid that we draw from the office remain negative.  I tried to aspirate fluid from her wrist right now at the bedside and did not get really fluid to send off.  I am going to continue to follow her closely.  Ambulate least order 1 dose of Solu-Medrol IV to see how she responds from an inflammation standpoint.  I will continue to follow her along closely.

## 2020-08-30 NOTE — Progress Notes (Signed)
Patient ID: Lori Orozco, female   DOB: 1950-05-27, 70 y.o.   MRN: [id number] Still with global right wrist swelling and pain.  There is discomfort with palpation and range of motion of the right wrist.  She is on broad-spectrum antibiotics.  A MRI of the right wrist is still pending.  Further treatment recommendations from surgical standpoint will depend on the findings of the MRI.

## 2020-08-30 NOTE — Plan of Care (Signed)
Plan for MRI of right wrist today.   Problem: Education: Goal: Knowledge of General Education information will improve Description: Including pain rating scale, medication(s)/side effects and non-pharmacologic comfort measures Outcome: Progressing   Problem: Health Behavior/Discharge Planning: Goal: Ability to manage health-related needs will improve Outcome: Progressing   Problem: Clinical Measurements: Goal: Ability to maintain clinical measurements within normal limits will improve Outcome: Progressing Goal: Will remain free from infection Outcome: Progressing Goal: Diagnostic test results will improve Outcome: Progressing Goal: Respiratory complications will improve Outcome: Progressing Goal: Cardiovascular complication will be avoided Outcome: Progressing   Problem: Activity: Goal: Risk for activity intolerance will decrease Outcome: Progressing   Problem: Nutrition: Goal: Adequate nutrition will be maintained Outcome: Progressing   Problem: Coping: Goal: Level of anxiety will decrease Outcome: Progressing   Problem: Elimination: Goal: Will not experience complications related to bowel motility Outcome: Progressing Goal: Will not experience complications related to urinary retention Outcome: Progressing   Problem: Pain Managment: Goal: General experience of comfort will improve Outcome: Progressing   Problem: Safety: Goal: Ability to remain free from injury will improve Outcome: Progressing   Problem: Skin Integrity: Goal: Risk for impaired skin integrity will decrease Outcome: Progressing

## 2020-08-31 ENCOUNTER — Ambulatory Visit: Payer: Medicare Other

## 2020-08-31 DIAGNOSIS — M25531 Pain in right wrist: Secondary | ICD-10-CM | POA: Diagnosis not present

## 2020-08-31 DIAGNOSIS — M25431 Effusion, right wrist: Secondary | ICD-10-CM | POA: Diagnosis not present

## 2020-08-31 MED ORDER — METHYLPREDNISOLONE SODIUM SUCC 125 MG IJ SOLR
60.0000 mg | Freq: Once | INTRAMUSCULAR | Status: AC
  Administered 2020-08-31: 60 mg via INTRAVENOUS
  Filled 2020-08-31: qty 0.96

## 2020-08-31 NOTE — Progress Notes (Signed)
PROGRESS NOTE  Lori Orozco QBV:[id number] DOB: Aug 14, 1950 DOA: 08/28/2020 PCP: [name], MD   LOS: 1 day   Brief Narrative / Interim history: 70 year old female with hypothyroidism, hyperlipidemia, HTN here with right wrist pain, erythema and swelling.  Orthopedic surgery saw her as an outpatient, had trialed a Medrol Dosepak without success and she eventually decided to come to the ED.  Orthopedic surgery consulted  Subjective / 24h Interval events: Received high-dose steroids yesterday, appreciate significant improvement  Assessment & Plan: Principal Problem Pain and swelling of the right wrist-imaging showed no evidence of bony destruction.  Orthopedic surgery consulted and following.  She underwent an MRI which showed findings concerning for inflammatory arthropathy without direct clear-cut evidence of septic arthritis.  She had a wrist aspirate as an outpatient and that was culture negative.  She underwent a trial of high-dose steroids yesterday with significant improvement today.  Okay with discontinuing antibiotics, repeat steroids today  Active Problems GAD-continue home medications COPD-stable, continue home medications Hypothyroidism-stable, continue Synthroid Essential hypertension-Per prior notes she does not want to take any medications Hyperlipidemia-continue statin   Scheduled Meds:  buPROPion  150 mg Oral Daily   busPIRone  15 mg Oral BID AC   divalproex  500 mg Oral QHS   enoxaparin (LOVENOX) injection  40 mg Subcutaneous Daily   estrogens (conjugated)  0.625 mg Oral Daily   levothyroxine  50 mcg Oral QHS   mirabegron ER  25 mg Oral QHS   mirtazapine  30 mg Oral QHS   pantoprazole  40 mg Oral BID   sertraline  100 mg Oral Daily   simvastatin  20 mg Oral QHS   Continuous Infusions:  sodium chloride 10 mL/hr at 08/31/20 0024   ceFEPime (MAXIPIME) IV 2 g (08/31/20 1406)   vancomycin Stopped (08/30/20 2310)   PRN Meds:.sodium chloride, acetaminophen  **OR** acetaminophen, albuterol, fluticasone, hydrALAZINE, HYDROmorphone (DILAUDID) injection **OR** HYDROmorphone (DILAUDID) injection, ipratropium-albuterol, ketorolac, ketotifen, LORazepam, ondansetron **OR** ondansetron (ZOFRAN) IV, polyethylene glycol  Diet Orders (From admission, onward)     Start     Ordered   08/29/20 1202  Diet regular Room service appropriate? Yes; Fluid consistency: Thin  Diet effective now       Question Answer Comment  Room service appropriate? Yes   Fluid consistency: Thin      08/29/20 1202            DVT prophylaxis: enoxaparin (LOVENOX) injection 40 mg Start: 08/29/20 1000 SCDs Start: 08/28/20 2326     Code Status: Full Code  Family Communication: no family at bedside   Status is: Inpatient  Remains inpatient appropriate because:Inpatient level of care appropriate due to severity of illness  Dispo: The patient is from: Home              Anticipated d/c is to: Home              Patient currently is not medically stable to d/c.   Difficult to place patient No  Level of care: Med-Surg  Consultants:  Orthopedic surgery  Procedures:  None   Microbiology  None   Antimicrobials: cefepime    Objective: Vitals:   08/30/20 0836 08/30/20 1450 08/30/20 2000 08/31/20 1359  BP: (!) 164/65 (!) 140/59 (!) 168/79 (!) 167/81  Pulse: 96 91 95 (!) 101  Resp: 17 17 18 18   Temp: 99.1 F (37.3 C) 98.4 F (36.9 C) 98.7 F (37.1 C) 98.7 F (37.1 C)  TempSrc: Oral Oral Oral Oral  SpO2: 96% 93% 92% 91%  Weight:      Height:        Intake/Output Summary (Last 24 hours) at 08/31/2020 1437 Last data filed at 08/31/2020 0913 Gross per 24 hour  Intake 981.43 ml  Output 1600 ml  Net -618.57 ml   Filed Weights   08/29/20 0122  Weight: 88 kg    Examination:  Constitutional: NAD Eyes: no scleral icterus ENMT: Mucous membranes are moist.  Neck: normal, supple Respiratory: clear to auscultation bilaterally, no wheezing, no crackles. Normal  respiratory effort. No accessory muscle use.  Cardiovascular: Regular rate and rhythm, no murmurs / rubs / gallops. No LE edema. Good peripheral pulses Abdomen: non distended, no tenderness. Bowel sounds positive.  Musculoskeletal: no clubbing / cyanosis.  Right wrist without swelling, erythema, good range of motion, but still somewhat limited motion due to pain Skin: no rashes Neurologic: Nonfocal   Data Reviewed: I have independently reviewed following labs and imaging studies  CBC: Recent Labs  Lab 08/28/20 2033 08/29/20 0412  WBC 7.8 10.4  NEUTROABS 6.3 6.7  HGB 11.5* 10.8*  HCT 35.4* 33.3*  MCV 97.0 97.9  PLT 327 306   Basic Metabolic Panel: Recent Labs  Lab 08/28/20 2033 08/29/20 0412  NA 135 136  K 4.6 4.1  CL 99 100  CO2 28 27  GLUCOSE 128* 98  BUN 29* 29*  CREATININE 0.97 0.84  CALCIUM 9.3 8.9   Liver Function Tests: Recent Labs  Lab 08/28/20 2033 08/29/20 0412  AST 16 11*  ALT 14 13  ALKPHOS 63 59  BILITOT 0.4 0.6  PROT 6.4* 5.8*  ALBUMIN 2.9* 2.7*   Coagulation Profile: Recent Labs  Lab 08/29/20 0412  INR 1.0   HbA1C: Recent Labs    08/29/20 0412  HGBA1C 5.5   CBG: No results for input(s): GLUCAP in the last 168 hours.  Recent Results (from the past 240 hour(s))  Body Fluid Culture     Status: None   Collection Time: 08/22/20  3:44 PM   Specimen: Synovial Fluid   Synovial Flu Wrist flu  Result Value Ref Range Status   Body Fluid Culture, Sterile Final report  Final   Organism ID, Bacteria Comment  Final    Comment: No growth in 56 - 72 hours.  Gram stain     Status: None   Collection Time: 08/22/20  3:44 PM  Result Value Ref Range Status   MICRO NUMBER: [id number]  Final   SPECIMEN QUALITY: Adequate  Final   Source SYNOVIAL FLUID  Final   STATUS: FINAL  Final   GRAM STAIN:   Final    Many Polymorphonuclear leukocytes No organisms seen  Culture, blood (routine x 2)     Status: None (Preliminary result)   Collection Time:  08/29/20  1:10 AM   Specimen: BLOOD  Result Value Ref Range Status   Specimen Description BLOOD RIGHT ANTECUBITAL  Final   Special Requests   Final    BOTTLES DRAWN AEROBIC AND ANAEROBIC Blood Culture results may not be optimal due to an excessive volume of blood received in culture bottles   Culture   Final    NO GROWTH 2 DAYS Performed at [facility], [address] [place], [place] [address]    Report Status PENDING  Incomplete  Culture, blood (routine x 2)     Status: None (Preliminary result)   Collection Time: 08/29/20  1:38 AM   Specimen: BLOOD LEFT HAND  Result Value  Ref Range Status   Specimen Description BLOOD LEFT HAND  Final   Special Requests   Final    BOTTLES DRAWN AEROBIC AND ANAEROBIC Blood Culture results may not be optimal due to an excessive volume of blood received in culture bottles   Culture   Final    NO GROWTH 2 DAYS Performed at [facility], [address] [place], [place] [address]    Report Status PENDING  Incomplete  Culture, Urine     Status: None   Collection Time: 08/29/20  4:27 AM   Specimen: Urine, Clean Catch  Result Value Ref Range Status   Specimen Description URINE, CLEAN CATCH  Final   Special Requests NONE  Final   Culture   Final    NO GROWTH Performed at [facility], [address], [place], [place] [address]    Report Status 08/30/2020 FINAL  Final  Resp Panel by RT-PCR (Flu A&B, Covid) Nasopharyngeal Swab     Status: None   Collection Time: 08/29/20  5:55 AM   Specimen: Nasopharyngeal Swab; Nasopharyngeal(NP) swabs in vial transport medium  Result Value Ref Range Status   SARS Coronavirus 2 by RT PCR NEGATIVE NEGATIVE Final    Comment: (NOTE) SARS-CoV-2 target nucleic acids are NOT DETECTED.  The SARS-CoV-2 RNA is generally detectable in upper respiratory specimens during the acute phase of infection. The lowest concentration of SARS-CoV-2 viral copies this assay can detect is 138 copies/mL. A negative  result does not preclude SARS-Cov-2 infection and should not be used as the sole basis for treatment or other patient management decisions. A negative result may occur with  improper specimen collection/handling, submission of specimen other than nasopharyngeal swab, presence of viral mutation(s) within the areas targeted by this assay, and inadequate number of viral copies(<138 copies/mL). A negative result must be combined with clinical observations, patient history, and epidemiological information. The expected result is Negative.  Fact Sheet for Patients:  BloggerCourse.com  Fact Sheet for Healthcare Providers:  SeriousBroker.it  This test is no t yet approved or cleared by the [place] FDA and  has been authorized for detection and/or diagnosis of SARS-CoV-2 by FDA under an Emergency Use Authorization (EUA). This EUA will remain  in effect (meaning this test can be used) for the duration of the COVID-19 declaration under Section 564(b)(1) of the Act, 21 U.S.C.section 360bbb-3(b)(1), unless the authorization is terminated  or revoked sooner.       Influenza A by PCR NEGATIVE NEGATIVE Final   Influenza B by PCR NEGATIVE NEGATIVE Final    Comment: (NOTE) The Xpert Xpress SARS-CoV-2/FLU/RSV plus assay is intended as an aid in the diagnosis of influenza from Nasopharyngeal swab specimens and should not be used as a sole basis for treatment. Nasal washings and aspirates are unacceptable for Xpert Xpress SARS-CoV-2/FLU/RSV testing.  Fact Sheet for Patients: BloggerCourse.com  Fact Sheet for Healthcare Providers: SeriousBroker.it  This test is not yet approved or cleared by the [place] FDA and has been authorized for detection and/or diagnosis of SARS-CoV-2 by FDA under an Emergency Use Authorization (EUA). This EUA will remain in effect (meaning this test can be used)  for the duration of the COVID-19 declaration under Section 564(b)(1) of the Act, 21 U.S.C. section 360bbb-3(b)(1), unless the authorization is terminated or revoked.  Performed at [facility], [address] [place], [place] [address]      Radiology Studies: No results found.   Pamella Pert, MD, PhD [facility]  Between 7 am - 7 pm I am available, please contact me via Amion (for emergencies) or Securechat (non urgent messages)  Between 7 pm - 7 am I am not available, please contact night coverage MD/APP via Amion

## 2020-08-31 NOTE — Progress Notes (Signed)
Patient ID: Lori Orozco, female   DOB: 24-Mar-1950, 70 y.o.   MRN: [id number] The patient is much improved today clinically.  She feels better as well and states that her right wrist still hurts some but not nearly like it was.  She reports also decreased swelling.  This is in response to Solu-Medrol and Toradol yesterday.  On my exam today, she looks significantly improved.  There is still no redness.  The swelling is much less and she tolerates me palpating the wrist with putting into the range of motion.  My plan would be to give her 1 more dose of Solu-Medrol today.  She can then likely be discharged tomorrow on a steroid taper.

## 2020-08-31 NOTE — Plan of Care (Signed)

## 2020-09-01 ENCOUNTER — Encounter (HOSPITAL_COMMUNITY): Payer: Self-pay | Admitting: Internal Medicine

## 2020-09-01 ENCOUNTER — Encounter (HOSPITAL_COMMUNITY)
Admission: EM | Disposition: A | Discharge status: Skilled Nursing Facility | Payer: Self-pay | Source: Home / Self Care | Attending: Internal Medicine

## 2020-09-01 ENCOUNTER — Inpatient Hospital Stay (HOSPITAL_COMMUNITY): Payer: Medicare Other | Admitting: Registered Nurse

## 2020-09-01 ENCOUNTER — Ambulatory Visit: Payer: Medicare Other | Admitting: Obstetrics & Gynecology

## 2020-09-01 ENCOUNTER — Ambulatory Visit: Payer: Medicare Other | Admitting: Orthopaedic Surgery

## 2020-09-01 DIAGNOSIS — M25531 Pain in right wrist: Secondary | ICD-10-CM

## 2020-09-01 DIAGNOSIS — M25431 Effusion, right wrist: Secondary | ICD-10-CM

## 2020-09-01 HISTORY — PX: I & D EXTREMITY: SHX5045

## 2020-09-01 LAB — SURGICAL PCR SCREEN
MRSA, PCR: NEGATIVE
Staphylococcus aureus: NEGATIVE

## 2020-09-01 SURGERY — IRRIGATION AND DEBRIDEMENT EXTREMITY
Anesthesia: General | Site: Wrist | Laterality: Right

## 2020-09-01 MED ORDER — CEFAZOLIN SODIUM-DEXTROSE 2-3 GM-%(50ML) IV SOLR
INTRAVENOUS | Status: DC | PRN
  Administered 2020-09-01: 2 g via INTRAVENOUS

## 2020-09-01 MED ORDER — HYDROMORPHONE HCL 1 MG/ML IJ SOLN
0.2500 mg | INTRAMUSCULAR | Status: DC | PRN
  Administered 2020-09-01: 0.5 mg via INTRAVENOUS

## 2020-09-01 MED ORDER — CEFAZOLIN SODIUM-DEXTROSE 1-4 GM/50ML-% IV SOLN
1.0000 g | Freq: Three times a day (TID) | INTRAVENOUS | Status: AC
  Administered 2020-09-01 – 2020-09-02 (×3): 1 g via INTRAVENOUS
  Filled 2020-09-01 (×3): qty 50

## 2020-09-01 MED ORDER — HYDROMORPHONE HCL 1 MG/ML IJ SOLN
0.2500 mg | INTRAMUSCULAR | Status: DC | PRN
Start: 2020-09-01 — End: 2020-09-01
  Administered 2020-09-01 (×2): 0.5 mg via INTRAVENOUS

## 2020-09-01 MED ORDER — FENTANYL CITRATE (PF) 100 MCG/2ML IJ SOLN
INTRAMUSCULAR | Status: DC | PRN
  Administered 2020-09-01: 50 ug via INTRAVENOUS
  Administered 2020-09-01: 25 ug via INTRAVENOUS
  Administered 2020-09-01: 50 ug via INTRAVENOUS

## 2020-09-01 MED ORDER — LIDOCAINE HCL (CARDIAC) PF 100 MG/5ML IV SOSY
PREFILLED_SYRINGE | INTRAVENOUS | Status: DC | PRN
  Administered 2020-09-01: 40 mg via INTRAVENOUS

## 2020-09-01 MED ORDER — PROPOFOL 10 MG/ML IV BOLUS
INTRAVENOUS | Status: AC
  Filled 2020-09-01: qty 20

## 2020-09-01 MED ORDER — CEFAZOLIN SODIUM-DEXTROSE 2-4 GM/100ML-% IV SOLN
INTRAVENOUS | Status: AC
  Filled 2020-09-01: qty 100

## 2020-09-01 MED ORDER — OXYCODONE HCL 5 MG PO TABS
5.0000 mg | ORAL_TABLET | ORAL | Status: DC | PRN
  Administered 2020-09-01 – 2020-09-04 (×7): 10 mg via ORAL
  Administered 2020-09-04: 5 mg via ORAL
  Administered 2020-09-05: 10 mg via ORAL
  Administered 2020-09-05: 5 mg via ORAL
  Administered 2020-09-06 – 2020-09-07 (×4): 10 mg via ORAL
  Filled 2020-09-01 (×3): qty 2
  Filled 2020-09-01 (×2): qty 1
  Filled 2020-09-01 (×10): qty 2

## 2020-09-01 MED ORDER — AMISULPRIDE (ANTIEMETIC) 5 MG/2ML IV SOLN: 10.0000 mg | Freq: Once | INTRAVENOUS | Status: DC | PRN

## 2020-09-01 MED ORDER — KETOROLAC TROMETHAMINE 30 MG/ML IJ SOLN
INTRAMUSCULAR | Status: AC
  Filled 2020-09-01: qty 1

## 2020-09-01 MED ORDER — DEXAMETHASONE SODIUM PHOSPHATE 10 MG/ML IJ SOLN
INTRAMUSCULAR | Status: DC | PRN
  Administered 2020-09-01: 4 mg via INTRAVENOUS

## 2020-09-01 MED ORDER — HYDROMORPHONE HCL 1 MG/ML IJ SOLN
INTRAMUSCULAR | Status: AC
  Filled 2020-09-01: qty 1

## 2020-09-01 MED ORDER — CHLORHEXIDINE GLUCONATE 0.12 % MT SOLN
OROMUCOSAL | Status: AC
  Administered 2020-09-01: 15 mL via OROMUCOSAL
  Filled 2020-09-01: qty 15

## 2020-09-01 MED ORDER — BUPIVACAINE HCL (PF) 0.25 % IJ SOLN
INTRAMUSCULAR | Status: AC
  Filled 2020-09-01: qty 30

## 2020-09-01 MED ORDER — PROPOFOL 10 MG/ML IV BOLUS
INTRAVENOUS | Status: DC | PRN
  Administered 2020-09-01: 150 mg via INTRAVENOUS

## 2020-09-01 MED ORDER — KETOROLAC TROMETHAMINE 30 MG/ML IJ SOLN
15.0000 mg | Freq: Once | INTRAMUSCULAR | Status: AC
Start: 2020-09-01 — End: 2020-09-01
  Administered 2020-09-01: 15 mg via INTRAVENOUS

## 2020-09-01 MED ORDER — CHLORHEXIDINE GLUCONATE 0.12 % MT SOLN
15.0000 mL | Freq: Once | OROMUCOSAL | Status: AC
Start: 2020-09-01 — End: 2020-09-01

## 2020-09-01 MED ORDER — ONDANSETRON HCL 4 MG/2ML IJ SOLN
INTRAMUSCULAR | Status: DC | PRN
  Administered 2020-09-01: 4 mg via INTRAVENOUS

## 2020-09-01 MED ORDER — LACTATED RINGERS IV SOLN: INTRAVENOUS | Status: DC

## 2020-09-01 MED ORDER — FENTANYL CITRATE (PF) 250 MCG/5ML IJ SOLN
INTRAMUSCULAR | Status: AC
  Filled 2020-09-01: qty 5

## 2020-09-01 MED ORDER — BUPIVACAINE HCL (PF) 0.25 % IJ SOLN
INTRAMUSCULAR | Status: DC | PRN
  Administered 2020-09-01: 10 mL

## 2020-09-01 MED ORDER — LACTATED RINGERS IV SOLN: INTRAVENOUS | Status: DC | PRN

## 2020-09-01 MED ORDER — 0.9 % SODIUM CHLORIDE (POUR BTL) OPTIME
TOPICAL | Status: DC | PRN
  Administered 2020-09-01: 1000 mL

## 2020-09-01 MED ORDER — ORAL CARE MOUTH RINSE: 15.0000 mL | Freq: Once | OROMUCOSAL | Status: AC

## 2020-09-01 SURGICAL SUPPLY — 48 items
BAG COUNTER SPONGE SURGICOUNT (BAG) ×2 IMPLANT
BAG SPNG CNTER NS LX DISP (BAG) ×1
BNDG COHESIVE 4X5 TAN STRL (GAUZE/BANDAGES/DRESSINGS) ×2 IMPLANT
BNDG ELASTIC 3X5.8 VLCR STR LF (GAUZE/BANDAGES/DRESSINGS) ×1 IMPLANT
BNDG GAUZE ELAST 4 BULKY (GAUZE/BANDAGES/DRESSINGS) ×1 IMPLANT
COVER SURGICAL LIGHT HANDLE (MISCELLANEOUS) ×2 IMPLANT
CUFF TOURN SGL QUICK 18X4 (TOURNIQUET CUFF) ×2 IMPLANT
CUFF TOURN SGL QUICK 34 (TOURNIQUET CUFF)
CUFF TRNQT CYL 34X4.125X (TOURNIQUET CUFF) IMPLANT
DRAPE ORTHO SPLIT 77X108 STRL (DRAPES) ×4
DRAPE SURG 17X23 STRL (DRAPES) IMPLANT
DRAPE SURG ORHT 6 SPLT 77X108 (DRAPES) ×2 IMPLANT
DRAPE U-SHAPE 47X51 STRL (DRAPES) ×2 IMPLANT
ELECT REM PT RETURN 9FT ADLT (ELECTROSURGICAL) ×2
ELECTRODE REM PT RTRN 9FT ADLT (ELECTROSURGICAL) IMPLANT
GAUZE SPONGE 4X4 12PLY STRL (GAUZE/BANDAGES/DRESSINGS) ×2 IMPLANT
GAUZE XEROFORM 1X8 LF (GAUZE/BANDAGES/DRESSINGS) ×2 IMPLANT
GLOVE SRG 8 PF TXTR STRL LF DI (GLOVE) ×2 IMPLANT
GLOVE SURG ENC MOIS LTX SZ8 (GLOVE) ×2 IMPLANT
GLOVE SURG ORTHO LTX SZ7.5 (GLOVE) ×2 IMPLANT
GLOVE SURG UNDER POLY LF SZ8 (GLOVE) ×4
GOWN STRL REUS W/ TWL LRG LVL3 (GOWN DISPOSABLE) ×1 IMPLANT
GOWN STRL REUS W/ TWL XL LVL3 (GOWN DISPOSABLE) ×2 IMPLANT
GOWN STRL REUS W/TWL LRG LVL3 (GOWN DISPOSABLE) ×2
GOWN STRL REUS W/TWL XL LVL3 (GOWN DISPOSABLE) ×4
KIT BASIN OR (CUSTOM PROCEDURE TRAY) ×2 IMPLANT
KIT TURNOVER KIT B (KITS) ×2 IMPLANT
MANIFOLD NEPTUNE II (INSTRUMENTS) ×2 IMPLANT
NDL HYPO 25GX1X1/2 BEV (NEEDLE) IMPLANT
NEEDLE HYPO 25GX1X1/2 BEV (NEEDLE) ×2 IMPLANT
NS IRRIG 1000ML POUR BTL (IV SOLUTION) ×2 IMPLANT
PACK ORTHO EXTREMITY (CUSTOM PROCEDURE TRAY) ×2 IMPLANT
PAD ARMBOARD 7.5X6 YLW CONV (MISCELLANEOUS) ×4 IMPLANT
PADDING CAST ABS 4INX4YD NS (CAST SUPPLIES) ×2
PADDING CAST ABS COTTON 4X4 ST (CAST SUPPLIES) ×2 IMPLANT
PADDING CAST COTTON 6X4 STRL (CAST SUPPLIES) ×2 IMPLANT
SOL PREP POV-IOD 4OZ 10% (MISCELLANEOUS) ×1 IMPLANT
SOL PREP PROV IODINE SCRUB 4OZ (MISCELLANEOUS) ×1 IMPLANT
SPONGE T-LAP 18X18 ~~LOC~~+RFID (SPONGE) ×2 IMPLANT
STOCKINETTE IMPERVIOUS 9X36 MD (GAUZE/BANDAGES/DRESSINGS) ×2 IMPLANT
SUT ETHILON 3 0 PS 1 (SUTURE) ×2 IMPLANT
SUT VIC AB 0 CT2 27 (SUTURE) ×1 IMPLANT
SYR CONTROL 10ML LL (SYRINGE) ×1 IMPLANT
TOWEL GREEN STERILE (TOWEL DISPOSABLE) ×2 IMPLANT
TOWEL GREEN STERILE FF (TOWEL DISPOSABLE) ×2 IMPLANT
TUBE CONNECTING 12X1/4 (SUCTIONS) ×2 IMPLANT
UNDERPAD 30X36 HEAVY ABSORB (UNDERPADS AND DIAPERS) ×2 IMPLANT
YANKAUER SUCT BULB TIP NO VENT (SUCTIONS) ×2 IMPLANT

## 2020-09-01 NOTE — Progress Notes (Signed)
Patient ID: Lori Orozco, female   DOB: Sep 25, 1950, 70 y.o.   MRN: [id number] Patient had a significant improvement in her symptoms yesterday with her right wrist.  That was after Toradol and Solu-Medrol.  We gave her another dose of Solu-Medrol.  Today however she is complaining of the pain being so severe that Dilaudid is even not helping it.  Her vital signs are stable and she appears comfortable but anytime I try to remove her right wrist she says the pain is unbearable.  When I elevated there is minimal swelling.  There is no redness but her pain is significant and outer portion of exam however, given the MRI findings of tenosynovitis and given the severity of her pain and her intolerance to that, this is forcing her hand to proceed with a surgical intervention.  I talked her in length about opening up the dorsal aspect of her wrist and irrigating the extensor compartments and potentially the wrist joint its self.  I explained the risk and benefits of this type of surgery.  I will put her on the operating room schedule for later today after 5 pm.

## 2020-09-01 NOTE — Brief Op Note (Signed)
09/01/2020  7:40 PM  PATIENT:  Lori Orozco  70 y.o. female  PRE-OPERATIVE DIAGNOSIS:  right wrist tynosynovitis  POST-OPERATIVE DIAGNOSIS:  right wrist tynosynovitis  PROCEDURE:  Procedure(s): TYNOSYNOVECTOMY (Right)  SURGEON:  Surgeon(s) and Role:    [name], MD - Primary  ANESTHESIA:   local and general  COUNTS:  YES  TOURNIQUET:   Total Tourniquet Time Documented: Upper Arm (Right) - 26 minutes Total: Upper Arm (Right) - 26 minutes   DICTATION: .Other Dictation: Dictation Number [id number]  PLAN OF CARE: Admit to inpatient   PATIENT DISPOSITION:  PACU - hemodynamically stable.   Delay start of Pharmacological VTE agent (>24hrs) due to surgical blood loss or risk of bleeding: no

## 2020-09-01 NOTE — Progress Notes (Signed)
Pt received scheduled Lovenox and PRN toradol this AM.  Bedside nurse advised to notify surgeon.

## 2020-09-01 NOTE — Anesthesia Postprocedure Evaluation (Signed)
Anesthesia Post Note  Patient: Lori Orozco  Procedure(s) Performed: TYNOSYNOVECTOMY (Right: Wrist)     Patient location during evaluation: PACU Anesthesia Type: General Level of consciousness: awake and alert Pain management: pain level controlled Vital Signs Assessment: post-procedure vital signs reviewed and stable Respiratory status: spontaneous breathing, nonlabored ventilation, respiratory function stable and patient connected to nasal cannula oxygen Cardiovascular status: blood pressure returned to baseline and stable Postop Assessment: no apparent nausea or vomiting Anesthetic complications: no   No notable events documented.  Last Vitals:  Vitals:   09/01/20 2030 09/01/20 2100  BP: (!) 157/66 (!) 153/66  Pulse: 100 82  Resp: 15 17  Temp:  36.5 C  SpO2: 94% 92%    Last Pain:  Vitals:   09/01/20 2100  TempSrc: Oral  PainSc:                  Kennieth Rad

## 2020-09-01 NOTE — Anesthesia Procedure Notes (Signed)
Procedure Name: LMA Insertion Date/Time: 09/01/2020 6:55 PM Performed by: Tillman Abide, CRNA Pre-anesthesia Checklist: Patient identified, Emergency Drugs available, Suction available and Patient being monitored Patient Re-evaluated:Patient Re-evaluated prior to induction Oxygen Delivery Method: Circle System Utilized Preoxygenation: Pre-oxygenation with 100% oxygen Induction Type: IV induction Ventilation: Mask ventilation without difficulty LMA: LMA inserted LMA Size: 4.0 Number of attempts: 1 Placement Confirmation: positive ETCO2 Tube secured with: Tape Dental Injury: Teeth and Oropharynx as per pre-operative assessment

## 2020-09-01 NOTE — Progress Notes (Signed)
Patient ID: Lori Orozco, female   DOB: 09/19/1950, 70 y.o.   MRN: [id number] HPI: Patient states that her right wrist pain came back throughout the evening.  She states her pain is severe once again.  She states she the Dilaudid is really not controlling pain.  Some chills this morning.  Denies any other joint pain.  Physical exam: Right wrist dorsal swelling no erythema.  Tenderness maximally over the wrist joint.  Able to wiggle fingers.  Sensation grossly intact throughout the right hand.  No rashes skin lesions ulcerations right wrist or hand.  Impression: Right wrist tenosynovitis  Plan: Informed Dr. [name] of the patient's setback.  He will see patient later today.  We will order a arm elevator sling.  Patient is encouraged to continue to work on range of motion of the hand and wrist as tolerated.  She can come out of the arm elevator sling as needed for comfort however I have discussed with her like for her to stand and is much as possible.

## 2020-09-01 NOTE — Progress Notes (Signed)
PROGRESS NOTE  Lori Orozco ZOX:[id number] DOB: August 25, 1950 DOA: 08/28/2020 PCP: [name], MD   LOS: 2 days   Brief Narrative / Interim history: 70 year old female with hypothyroidism, hyperlipidemia, HTN here with right wrist pain, erythema and swelling.  Orthopedic surgery saw her as an outpatient, had trialed a Medrol Dosepak without success and she eventually decided to come to the ED.  Orthopedic surgery consulted  Subjective / 24h Interval events: Complains of severe right wrist pain today.  She was getting better yesterday but last night swelling and redness came back along with excruciating pain.  Dilaudid is barely helping her pain  Assessment & Plan: Principal Problem Pain and swelling of the right wrist-imaging showed no evidence of bony destruction.  Orthopedic surgery consulted and following.  She underwent an MRI which showed findings concerning for inflammatory arthropathy without direct clear-cut evidence of septic arthritis.  She had a wrist aspirate as an outpatient and that was culture negative.   -A trial of high-dose steroids appear to improve her wrist yesterday but looks like her pain is worse.  Dr. [name] plans for surgery later this afternoon  Active Problems GAD-continue home medications COPD-stable, continue home medications Hypothyroidism-stable, continue Synthroid Essential hypertension-Per prior notes she does not want to take any medications Hyperlipidemia-continue statin   Scheduled Meds:  buPROPion  150 mg Oral Daily   busPIRone  15 mg Oral BID AC   divalproex  500 mg Oral QHS   enoxaparin (LOVENOX) injection  40 mg Subcutaneous Daily   estrogens (conjugated)  0.625 mg Oral Daily   levothyroxine  50 mcg Oral QHS   mirabegron ER  25 mg Oral QHS   mirtazapine  30 mg Oral QHS   pantoprazole  40 mg Oral BID   sertraline  100 mg Oral Daily   simvastatin  20 mg Oral QHS   Continuous Infusions:  sodium chloride 10 mL/hr at 08/31/20 0024    PRN Meds:.sodium chloride, acetaminophen **OR** acetaminophen, albuterol, fluticasone, hydrALAZINE, HYDROmorphone (DILAUDID) injection **OR** HYDROmorphone (DILAUDID) injection, ipratropium-albuterol, ketorolac, ketotifen, LORazepam, ondansetron **OR** ondansetron (ZOFRAN) IV, polyethylene glycol  Diet Orders (From admission, onward)     Start     Ordered   09/01/20 1103  Diet NPO time specified  Diet effective now        09/01/20 1103            DVT prophylaxis: enoxaparin (LOVENOX) injection 40 mg Start: 08/29/20 1000 SCDs Start: 08/28/20 2326     Code Status: Full Code  Family Communication: no family at bedside   Status is: Inpatient  Remains inpatient appropriate because:Inpatient level of care appropriate due to severity of illness  Dispo: The patient is from: Home              Anticipated d/c is to: Home              Patient currently is not medically stable to d/c.   Difficult to place patient No  Level of care: Med-Surg  Consultants:  Orthopedic surgery  Procedures:  None   Microbiology  None   Antimicrobials: cefepime    Objective: Vitals:   08/30/20 2000 08/31/20 1359 08/31/20 2013 09/01/20 0900  BP: (!) 168/79 (!) 167/81 (!) 153/60 (!) 145/68  Pulse: 95 (!) 101 86 85  Resp: Temp: 98.7 F (37.1 C) 98.7 F (37.1 C) 98.3 F (36.8 C) 99.2 F (37.3 C)  TempSrc: Oral Oral Oral Oral  SpO2: 92% 91% 93%  91%  Weight:      Height:        Intake/Output Summary (Last 24 hours) at 09/01/2020 1136 Last data filed at 09/01/2020 0900 Gross per 24 hour  Intake 384.2 ml  Output 650 ml  Net -265.8 ml    Filed Weights   08/29/20 0122  Weight: 88 kg    Examination:  Constitutional: No distress Eyes: No icterus ENMT: mmm Neck: normal, supple Respiratory: Clear bilaterally, no wheezing heard Cardiovascular: Regular rate and rhythm, no murmurs, no edema Abdomen: Soft, NT, ND, bowel sounds positive Musculoskeletal: no clubbing /  cyanosis.  Deferring right wrist exam due to excruciating pain Skin: No rashes Neurologic: No focal deficits   Data Reviewed: I have independently reviewed following labs and imaging studies  CBC: Recent Labs  Lab 08/28/20 2033 08/29/20 0412  WBC 7.8 10.4  NEUTROABS 6.3 6.7  HGB 11.5* 10.8*  HCT 35.4* 33.3*  MCV 97.0 97.9  PLT 327 306    Basic Metabolic Panel: Recent Labs  Lab 08/28/20 2033 08/29/20 0412  NA 135 136  K 4.6 4.1  CL 99 100  CO2 28 27  GLUCOSE 128* 98  BUN 29* 29*  CREATININE 0.97 0.84  CALCIUM 9.3 8.9    Liver Function Tests: Recent Labs  Lab 08/28/20 2033 08/29/20 0412  AST 16 11*  ALT 14 13  ALKPHOS 63 59  BILITOT 0.4 0.6  PROT 6.4* 5.8*  ALBUMIN 2.9* 2.7*    Coagulation Profile: Recent Labs  Lab 08/29/20 0412  INR 1.0    HbA1C: No results for input(s): HGBA1C in the last 72 hours.  CBG: No results for input(s): GLUCAP in the last 168 hours.  Recent Results (from the past 240 hour(s))  Body Fluid Culture     Status: None   Collection Time: 08/22/20  3:44 PM   Specimen: Synovial Fluid   Synovial Flu Wrist flu  Result Value Ref Range Status   Body Fluid Culture, Sterile Final report  Final   Organism ID, Bacteria Comment  Final    Comment: No growth in 56 - 72 hours.  Gram stain     Status: None   Collection Time: 08/22/20  3:44 PM  Result Value Ref Range Status   MICRO NUMBER: [id number]  Final   SPECIMEN QUALITY: Adequate  Final   Source SYNOVIAL FLUID  Final   STATUS: FINAL  Final   GRAM STAIN:   Final    Many Polymorphonuclear leukocytes No organisms seen  Culture, blood (routine x 2)     Status: None (Preliminary result)   Collection Time: 08/29/20  1:10 AM   Specimen: BLOOD  Result Value Ref Range Status   Specimen Description BLOOD RIGHT ANTECUBITAL  Final   Special Requests   Final    BOTTLES DRAWN AEROBIC AND ANAEROBIC Blood Culture results may not be optimal due to an excessive volume of blood received in  culture bottles   Culture   Final    NO GROWTH 3 DAYS Performed at [facility], [address] [place], [place] [address]    Report Status PENDING  Incomplete  Culture, blood (routine x 2)     Status: None (Preliminary result)   Collection Time: 08/29/20  1:38 AM   Specimen: BLOOD LEFT HAND  Result Value Ref Range Status   Specimen Description BLOOD LEFT HAND  Final   Special Requests   Final    BOTTLES DRAWN AEROBIC AND ANAEROBIC Blood Culture results may not  be optimal due to an excessive volume of blood received in culture bottles   Culture   Final    NO GROWTH 3 DAYS Performed at [facility], [address] [place], [place] [address]    Report Status PENDING  Incomplete  Culture, Urine     Status: None   Collection Time: 08/29/20  4:27 AM   Specimen: Urine, Clean Catch  Result Value Ref Range Status   Specimen Description URINE, CLEAN CATCH  Final   Special Requests NONE  Final   Culture   Final    NO GROWTH Performed at [facility], [address], [place], [place] [address]    Report Status 08/30/2020 FINAL  Final  Resp Panel by RT-PCR (Flu A&B, Covid) Nasopharyngeal Swab     Status: None   Collection Time: 08/29/20  5:55 AM   Specimen: Nasopharyngeal Swab; Nasopharyngeal(NP) swabs in vial transport medium  Result Value Ref Range Status   SARS Coronavirus 2 by RT PCR NEGATIVE NEGATIVE Final    Comment: (NOTE) SARS-CoV-2 target nucleic acids are NOT DETECTED.  The SARS-CoV-2 RNA is generally detectable in upper respiratory specimens during the acute phase of infection. The lowest concentration of SARS-CoV-2 viral copies this assay can detect is 138 copies/mL. A negative result does not preclude SARS-Cov-2 infection and should not be used as the sole basis for treatment or other patient management decisions. A negative result may occur with  improper specimen collection/handling, submission of specimen other than nasopharyngeal swab, presence of  viral mutation(s) within the areas targeted by this assay, and inadequate number of viral copies(<138 copies/mL). A negative result must be combined with clinical observations, patient history, and epidemiological information. The expected result is Negative.  Fact Sheet for Patients:  BloggerCourse.com  Fact Sheet for Healthcare Providers:  SeriousBroker.it  This test is no t yet approved or cleared by the [place] FDA and  has been authorized for detection and/or diagnosis of SARS-CoV-2 by FDA under an Emergency Use Authorization (EUA). This EUA will remain  in effect (meaning this test can be used) for the duration of the COVID-19 declaration under Section 564(b)(1) of the Act, 21 U.S.C.section 360bbb-3(b)(1), unless the authorization is terminated  or revoked sooner.       Influenza A by PCR NEGATIVE NEGATIVE Final   Influenza B by PCR NEGATIVE NEGATIVE Final    Comment: (NOTE) The Xpert Xpress SARS-CoV-2/FLU/RSV plus assay is intended as an aid in the diagnosis of influenza from Nasopharyngeal swab specimens and should not be used as a sole basis for treatment. Nasal washings and aspirates are unacceptable for Xpert Xpress SARS-CoV-2/FLU/RSV testing.  Fact Sheet for Patients: BloggerCourse.com  Fact Sheet for Healthcare Providers: SeriousBroker.it  This test is not yet approved or cleared by the [place] FDA and has been authorized for detection and/or diagnosis of SARS-CoV-2 by FDA under an Emergency Use Authorization (EUA). This EUA will remain in effect (meaning this test can be used) for the duration of the COVID-19 declaration under Section 564(b)(1) of the Act, 21 U.S.C. section 360bbb-3(b)(1), unless the authorization is terminated or revoked.  Performed at [facility], [address] [place], [place] [address]       Radiology Studies: No  results found.   Pamella Pert, MD, PhD [facility]  Between 7 am - 7 pm I am available, please contact me via Amion (for emergencies) or Securechat (non urgent messages)  Between 7 pm - 7 am I  am not available, please contact night coverage MD/APP via Amion

## 2020-09-01 NOTE — Transfer of Care (Signed)
Immediate Anesthesia Transfer of Care Note  Patient: Lori Orozco  Procedure(s) Performed: TYNOSYNOVECTOMY (Right: Wrist)  Patient Location: PACU  Anesthesia Type:General  Level of Consciousness: drowsy and patient cooperative  Airway & Oxygen Therapy: Patient Spontanous Breathing and Patient connected to face mask oxygen  Post-op Assessment: Report given to RN and Post -op Vital signs reviewed and stable  Post vital signs: Reviewed and stable  Last Vitals:  Vitals Value Taken Time  BP 174/74 09/01/20 1942  Temp    Pulse 98 09/01/20 1943  Resp 18 09/01/20 1943  SpO2 99 % 09/01/20 1943  Vitals shown include unvalidated device data.  Last Pain:  Vitals:   09/01/20 1751  TempSrc: Oral  PainSc: 10-Worst pain ever      Patients Stated Pain Goal: 5 (09/01/20 1751)  Complications: No notable events documented.

## 2020-09-01 NOTE — Progress Notes (Signed)
Orthopedic Tech Progress Note Patient Details:  Lori Orozco September 17, 1950 [id number]  Ortho Devices Type of Ortho Device: Sling arm elevator   Post Interventions Patient Tolerated: Well  [name] [name] 09/01/2020, 3:10 PM

## 2020-09-01 NOTE — Anesthesia Preprocedure Evaluation (Signed)
Anesthesia Evaluation  Patient identified by MRN, date of birth, ID band Patient awake    Reviewed: Allergy & Precautions, NPO status , Patient's Chart, lab work & pertinent test results  Airway Mallampati: II  TM Distance: >3 FB     Dental   Pulmonary asthma , COPD,  COPD inhaler, former smoker,    breath sounds clear to auscultation       Cardiovascular hypertension,  Rhythm:Regular Rate:Normal     Neuro/Psych  Headaches,  Neuromuscular disease    GI/Hepatic Neg liver ROS, hiatal hernia, GERD  ,  Endo/Other  Hypothyroidism   Renal/GU Renal disease     Musculoskeletal  (+) Arthritis , Fibromyalgia -  Abdominal   Peds  Hematology  (+) anemia ,   Anesthesia Other Findings   Reproductive/Obstetrics                             Anesthesia Physical Anesthesia Plan  ASA: 3  Anesthesia Plan: General   Post-op Pain Management:    Induction: Intravenous  PONV Risk Score and Plan: 3 and Dexamethasone, Ondansetron and Treatment may vary due to age or medical condition  Airway Management Planned: LMA  Additional Equipment:   Intra-op Plan:   Post-operative Plan: Extubation in OR  Informed Consent: I have reviewed the patients History and Physical, chart, labs and discussed the procedure including the risks, benefits and alternatives for the proposed anesthesia with the patient or authorized representative who has indicated his/her understanding and acceptance.     Dental advisory given  Plan Discussed with: CRNA and Surgeon  Anesthesia Plan Comments: (Pt consented for PNB postoperatively if needed.)        Anesthesia Quick Evaluation

## 2020-09-01 NOTE — Op Note (Signed)
NAMEENDIA, MONCUR MEDICAL RECORD NO: [id number] ACCOUNT NO: [id number] DATE OF BIRTH: 1950/10/09 FACILITY: [facility] LOCATION: [facility] PHYSICIAN: Vanita Panda. [name], MD  Operative Report   DATE OF PROCEDURE: 09/01/2020  PREOPERATIVE DIAGNOSIS:  Severe right wrist pain with tenosynovitis of the extensor compartments, right wrist.  POSTOPERATIVE DIAGNOSIS:  Severe right wrist pain with tenosynovitis of the extensor compartments, right wrist.  PROCEDURE:  Right wrist dorsal compartment extensor tendon tenosynovectomy with irrigation of the third and sixth dorsal extensor compartments and irrigation of the wrist joint.  FINDINGS:  Severe tenosynovitis, right wrist dorsal compartments.  No gross evidence of infection.  SURGEON:  Vanita Panda. [name], MD  ANESTHESIA: 1.  General. 2.  Local with 0.25% plain Marcaine.  TOURNIQUET TIME:  Under 1 hour.  BLOOD LOSS:  Minimal.  COMPLICATIONS:  None.  INDICATIONS:  The patient is a 70 year old female who developed severe debilitating wrist pain over a week ago of her right wrist at the dorsal aspect.  We saw her in the office last Monday and was able to aspirate fluid from the dorsal aspect of the  right wrist.  It was abundant amount of fluid and we sent this for Gram stain and culture, was negative for any infection.  We felt this was an inflammatory process and started on steroid but an antibiotic just in case.  She then showed up in the office  last Thursday and it did look better overall.  However, over the weekend, it worsened, so she was admitted to the hospital and started on IV antibiotics.  Her white blood cell count was normal, but her inflammatory markers with CRP and sed rate were  elevated. During the week, she was given Solu-Medrol and Toradol and yesterday, her exam was almost completely normal with minimal pain and minimal swelling.  She was given one more dose of Solu-Medrol and then today woke up again with severe   debilitating right wrist pain.  We are at the point that we need to proceed with a tenosynovectomy given the severity of her pain.  DESCRIPTION OF PROCEDURE:  After informed consent was obtained, appropriate right wrist was marked.  She was taken to the operating room and placed supine on the operating table with the right arm on arm table.  General anesthesia was then obtained via  LMA.  A nonsterile tourniquet was placed around her upper right arm and her right forearm, wrist and hand were prepped and draped with Betadine scrub and paint.  A timeout was called, and she was identified correct patient, correct right wrist.  We then  had the tourniquet elevated to 250 mm of pressure.  I made a dorsal incision at the midpoint of the wrist and carried this proximally and distally.  We dissected down to the third and fourth dorsal extensor compartments and opened these up and found some  significant tenosynovitis, but no evidence of infection.  We removed as much of the tenosynovium as we could and then did this with the sixth dorsal compartment as well.  We then dissected down to the wrist joint, opened the wrist joint and found no  fluid in the wrist joint itself.  I then copiously irrigated the wrist joint and the extensor compartments of the wrist with normal saline solution and again removed as much tenosynovium as we could.  I then closed the joint capsule of the wrist with 0  Vicryl suture followed by 0 Vicryl in subcutaneous tissue and interrupted 3-0 nylon to close the  skin.  Xeroform well-padded sterile dressing was applied.  Prior to applying dressing, we infiltrated the wrist joint and all the surrounding tissue with  0.25% plain Marcaine.  The tourniquet was let down.  Fingers pinked nicely.  Well-padded sterile dressing was applied.  She was taken to recovery room in stable condition with all final counts being correct.  No complications noted.   SHW D: 09/01/2020 7:37:32 pm T: 09/01/2020  10:36:00 pm  JOB: [id number]/ [id number]

## 2020-09-01 NOTE — Progress Notes (Signed)
Patient ID: Lori Orozco, female   DOB: [birth year], 70 y.o.   MRN: [id number] It does not matter that the patient had a dose of Lovenox earlier today.  The surgery will be done on her right wrist and a tourniquet will use.  There is also minimal bleeding risk with just soft tissue surgery.  Surgery will still go forward as planned this evening.

## 2020-09-02 ENCOUNTER — Encounter (HOSPITAL_COMMUNITY): Payer: Self-pay | Admitting: Orthopaedic Surgery

## 2020-09-02 DIAGNOSIS — M25531 Pain in right wrist: Secondary | ICD-10-CM | POA: Diagnosis not present

## 2020-09-02 DIAGNOSIS — M25431 Effusion, right wrist: Secondary | ICD-10-CM | POA: Diagnosis not present

## 2020-09-02 LAB — BASIC METABOLIC PANEL
Anion gap: 6 (ref 5–15)
BUN: 24 mg/dL — ABNORMAL HIGH (ref 8–23)
CO2: 31 mmol/L (ref 22–32)
Calcium: 9 mg/dL (ref 8.9–10.3)
Chloride: 100 mmol/L (ref 98–111)
Creatinine, Ser: 0.85 mg/dL (ref 0.44–1.00)
GFR, Estimated: >60 mL/min (ref >60)
Glucose, Bld: 134 mg/dL — ABNORMAL HIGH (ref 70–99)
Potassium: 4.9 mmol/L (ref 3.5–5.1)
Sodium: 137 mmol/L (ref 135–145)

## 2020-09-02 LAB — CBC
HCT: 35.2 % — ABNORMAL LOW (ref 36.0–46.0)
Hemoglobin: 11 g/dL — ABNORMAL LOW (ref 12.0–15.0)
MCH: 30.9 pg (ref 26.0–34.0)
MCHC: 31.3 g/dL (ref 30.0–36.0)
MCV: 98.9 fL (ref 80.0–100.0)
Platelets: 316 10*3/uL (ref 150–400)
RBC: 3.56 MIL/uL — ABNORMAL LOW (ref 3.87–5.11)
RDW: 14.2 % (ref 11.5–15.5)
WBC: 8.1 10*3/uL (ref 4.0–10.5)
nRBC: 0 % (ref 0.0–0.2)

## 2020-09-02 MED ORDER — DOXYCYCLINE HYCLATE 100 MG PO TABS: 100.0000 mg | ORAL_TABLET | Freq: Two times a day (BID) | ORAL | 0 refills | Status: AC

## 2020-09-02 MED ORDER — OXYCODONE HCL 5 MG PO TABS: 5.0000 mg | ORAL_TABLET | Freq: Four times a day (QID) | ORAL | 0 refills | Status: DC | PRN

## 2020-09-02 NOTE — Progress Notes (Signed)
Patient ID: Lori Orozco, female   DOB: [birth year], 70 y.o.   MRN: [id number] Seems to be better overall.  She tolerated surgery last night with a tenosynovectomy of the extensor compartments of her right wrist as well as washing out the right wrist joint.  He was certainly an inflammatory process that was seen.  I did change the dressing at the bedside today.  There is swelling of the right wrist to be expected at this standpoint.  She can likely be discharged home tomorrow.  At this point I will send her home on oral antibiotics and pain medication which I will send into her pharmacy.  All the dressing care instructions as well and follow-up instructions.

## 2020-09-02 NOTE — Progress Notes (Addendum)
PROGRESS NOTE  FUSAYE WACHTEL GLO:[id number] DOB: 1950-03-03 DOA: 08/28/2020 PCP: [name], MD   LOS: 3 days   Brief Narrative / Interim history: 70 year old female with hypothyroidism, hyperlipidemia, HTN here with right wrist pain, erythema and swelling.  Orthopedic surgery saw her as an outpatient, had trialed a Medrol Dosepak without success and she eventually decided to come to the ED.  Orthopedic surgery consulted  Subjective / 24h Interval events: Had surgery last night, feeling better this morning.  Still in pain but definitely less than prior to the surgery  Assessment & Plan: Principal Problem Pain and swelling of the right wrist-imaging showed no evidence of bony destruction.  Orthopedic surgery consulted and followed patient while hospitalized.  She underwent an MRI which showed findings concerning for inflammatory arthropathy without direct clear-cut evidence of septic arthritis.  She had a wrist aspirate as an outpatient and that was culture negative.  She was given a trial of steroids which initially helped then pain reoccurred.  Given lack of improvement with steroids she was taken to the OR on 7/7 and she is status post right wrist dorsal compartment extensor tendon tenosynovectomy.  Seems to be improving postop, still requiring IV pain but improving.  If no longer needs IV pain meds okay to discharge home tomorrow  Active Problems GAD-continue home medications COPD-stable, continue home medications Hypothyroidism-stable, continue Synthroid Hyperlipidemia-continue statin   Scheduled Meds:  buPROPion  150 mg Oral Daily   busPIRone  15 mg Oral BID AC   divalproex  500 mg Oral QHS   estrogens (conjugated)  0.625 mg Oral Daily   levothyroxine  50 mcg Oral QHS   mirabegron ER  25 mg Oral QHS   mirtazapine  30 mg Oral QHS   pantoprazole  40 mg Oral BID   sertraline  100 mg Oral Daily   simvastatin  20 mg Oral QHS   Continuous Infusions:  sodium chloride 10  mL/hr at 08/31/20 0024    ceFAZolin (ANCEF) IV 1 g (09/02/20 0646)   PRN Meds:.sodium chloride, acetaminophen **OR** acetaminophen, albuterol, fluticasone, hydrALAZINE, HYDROmorphone (DILAUDID) injection **OR** HYDROmorphone (DILAUDID) injection, ipratropium-albuterol, ketorolac, ketotifen, LORazepam, ondansetron **OR** ondansetron (ZOFRAN) IV, oxyCODONE, polyethylene glycol  Diet Orders (From admission, onward)     Start     Ordered   09/01/20 2105  Diet regular Room service appropriate? Yes; Fluid consistency: Thin  Diet effective now       Question Answer Comment  Room service appropriate? Yes   Fluid consistency: Thin      09/01/20 2104            DVT prophylaxis: SCDs Start: 08/28/20 2326     Code Status: Full Code  Family Communication: no family at bedside   Status is: Inpatient  Remains inpatient appropriate because:Inpatient level of care appropriate due to severity of illness  Dispo: The patient is from: Home              Anticipated d/c is to: Home              Patient currently is not medically stable to d/c.   Difficult to place patient No  Level of care: Med-Surg  Consultants:  Orthopedic surgery  Procedures:  None   Microbiology  None   Antimicrobials: cefepime    Objective: Vitals:   09/01/20 2030 09/01/20 2100 09/02/20 0825 09/02/20 1119  BP: (!) 157/66 (!) 153/66 (!) 141/59 (!) 144/59  Pulse: 100 82 74 95  Resp: 15 17 18  20  Temp:  97.7 F (36.5 C) 98.1 F (36.7 C) 98.6 F (37 C)  TempSrc:  Oral Oral Oral  SpO2: 94% 92% 94% 90%  Weight:      Height:        Intake/Output Summary (Last 24 hours) at 09/02/2020 1200 Last data filed at 09/02/2020 1109 Gross per 24 hour  Intake 620.73 ml  Output 960 ml  Net -339.27 ml    Filed Weights   08/29/20 0122  Weight: 88 kg    Examination:  Constitutional: She is in no distress Eyes: No scleral icterus ENMT: mmm Neck: normal, supple Cardiovascular: No edema Abdomen: No apparent  distention Musculoskeletal: Right wrist wrapped Skin: No rashes Neurologic: Nonfocal   Data Reviewed: I have independently reviewed following labs and imaging studies  CBC: Recent Labs  Lab 08/28/20 2033 08/29/20 0412 09/02/20 0334  WBC 7.8 10.4 8.1  NEUTROABS 6.3 6.7  --   HGB 11.5* 10.8* 11.0*  HCT 35.4* 33.3* 35.2*  MCV 97.0 97.9 98.9  PLT 327 306 316    Basic Metabolic Panel: Recent Labs  Lab 08/28/20 2033 08/29/20 0412 09/02/20 0334  NA 135 136 137  K 4.6 4.1 4.9  CL 99 100 100  CO2 28 27 31   GLUCOSE 128* 98 134*  BUN 29* 29* 24*  CREATININE 0.97 0.84 0.85  CALCIUM 9.3 8.9 9.0    Liver Function Tests: Recent Labs  Lab 08/28/20 2033 08/29/20 0412  AST 16 11*  ALT 14 13  ALKPHOS 63 59  BILITOT 0.4 0.6  PROT 6.4* 5.8*  ALBUMIN 2.9* 2.7*    Coagulation Profile: Recent Labs  Lab 08/29/20 0412  INR 1.0    HbA1C: No results for input(s): HGBA1C in the last 72 hours.  CBG: No results for input(s): GLUCAP in the last 168 hours.  Recent Results (from the past 240 hour(s))  Culture, blood (routine x 2)     Status: None (Preliminary result)   Collection Time: 08/29/20  1:10 AM   Specimen: BLOOD  Result Value Ref Range Status   Specimen Description BLOOD RIGHT ANTECUBITAL  Final   Special Requests   Final    BOTTLES DRAWN AEROBIC AND ANAEROBIC Blood Culture results may not be optimal due to an excessive volume of blood received in culture bottles   Culture   Final    NO GROWTH 4 DAYS Performed at [facility], [address] [place], [place] [address]    Report Status PENDING  Incomplete  Culture, blood (routine x 2)     Status: None (Preliminary result)   Collection Time: 08/29/20  1:38 AM   Specimen: BLOOD LEFT HAND  Result Value Ref Range Status   Specimen Description BLOOD LEFT HAND  Final   Special Requests   Final    BOTTLES DRAWN AEROBIC AND ANAEROBIC Blood Culture results may not be optimal due to an excessive volume of blood  received in culture bottles   Culture   Final    NO GROWTH 4 DAYS Performed at [facility], [address], [place], [place] [address]    Report Status PENDING  Incomplete  Culture, Urine     Status: None   Collection Time: 08/29/20  4:27 AM   Specimen: Urine, Clean Catch  Result Value Ref Range Status   Specimen Description URINE, CLEAN CATCH  Final   Special Requests NONE  Final   Culture   Final    NO GROWTH Performed at [facility], [address], [place], [place] [address]    Report Status 08/30/2020 FINAL  Final  Resp Panel by RT-PCR (Flu A&B, Covid) Nasopharyngeal Swab     Status: None   Collection Time: 08/29/20  5:55 AM   Specimen: Nasopharyngeal Swab; Nasopharyngeal(NP) swabs in vial transport medium  Result Value Ref Range Status   SARS Coronavirus 2 by RT PCR NEGATIVE NEGATIVE Final    Comment: (NOTE) SARS-CoV-2 target nucleic acids are NOT DETECTED.  The SARS-CoV-2 RNA is generally detectable in upper respiratory specimens during the acute phase of infection. The lowest concentration of SARS-CoV-2 viral copies this assay can detect is 138 copies/mL. A negative result does not preclude SARS-Cov-2 infection and should not be used as the sole basis for treatment or other patient management decisions. A negative result may occur with  improper specimen collection/handling, submission of specimen other than nasopharyngeal swab, presence of viral mutation(s) within the areas targeted by this assay, and inadequate number of viral copies(<138 copies/mL). A negative result must be combined with clinical observations, patient history, and epidemiological information. The expected result is Negative.  Fact Sheet for Patients:  BloggerCourse.com  Fact Sheet for Healthcare Providers:  SeriousBroker.it  This test is no t yet approved or cleared by the [place] FDA and  has been authorized for detection  and/or diagnosis of SARS-CoV-2 by FDA under an Emergency Use Authorization (EUA). This EUA will remain  in effect (meaning this test can be used) for the duration of the COVID-19 declaration under Section 564(b)(1) of the Act, 21 U.S.C.section 360bbb-3(b)(1), unless the authorization is terminated  or revoked sooner.       Influenza A by PCR NEGATIVE NEGATIVE Final   Influenza B by PCR NEGATIVE NEGATIVE Final    Comment: (NOTE) The Xpert Xpress SARS-CoV-2/FLU/RSV plus assay is intended as an aid in the diagnosis of influenza from Nasopharyngeal swab specimens and should not be used as a sole basis for treatment. Nasal washings and aspirates are unacceptable for Xpert Xpress SARS-CoV-2/FLU/RSV testing.  Fact Sheet for Patients: BloggerCourse.com  Fact Sheet for Healthcare Providers: SeriousBroker.it  This test is not yet approved or cleared by the [place] FDA and has been authorized for detection and/or diagnosis of SARS-CoV-2 by FDA under an Emergency Use Authorization (EUA). This EUA will remain in effect (meaning this test can be used) for the duration of the COVID-19 declaration under Section 564(b)(1) of the Act, 21 U.S.C. section 360bbb-3(b)(1), unless the authorization is terminated or revoked.  Performed at [facility], [address] [place], [place] [address]   Surgical pcr screen     Status: None   Collection Time: 09/01/20 12:03 PM   Specimen: Nasal Mucosa; Nasal Swab  Result Value Ref Range Status   MRSA, PCR NEGATIVE NEGATIVE Final   Staphylococcus aureus NEGATIVE NEGATIVE Final    Comment: (NOTE) The Xpert SA Assay (FDA approved for NASAL specimens in patients 30 years of age and older), is one component of a comprehensive surveillance program. It is not intended to diagnose infection nor to guide or monitor treatment. Performed at [facility], [address], [place],  [place] [address]       Radiology Studies: No results found.   Pamella Pert, MD, PhD [facility]  Between 7 am - 7 pm I am available, please contact me via Amion (for emergencies) or Securechat (non urgent messages)  Between 7 pm - 7 am I am not available, please contact  night coverage MD/APP via Amion

## 2020-09-02 NOTE — Discharge Instructions (Addendum)
Use your right hand as comfort allows. Ice and elevation intermittently as needed throughout the day for swelling. Keep your incision dry for least 5 to 6 days and then you can get it wet daily in the shower. Placed large daily Band-Aids over your incision after each shower. Try to bend and straighten your fingers on your right hand is much as possible.

## 2020-09-02 NOTE — Progress Notes (Signed)
Subjective: 1 Day Post-Op Procedure(s) (LRB): TYNOSYNOVECTOMY (Right) Patient reports pain as mild.  States pain is much improved this AM.   Objective: Vital signs in last 24 hours: Temp:  [97.7 F (36.5 C)-99.2 F (37.3 C)] 97.7 F (36.5 C) (07/07 2100) Pulse Rate:  [82-116] 82 (07/07 2100) Resp:  [11-22] 17 (07/07 2100) BP: (137-192)/(61-74) 153/66 (07/07 2100) SpO2:  [91 %-100 %] 92 % (07/07 2100)  Intake/Output from previous day: 07/07 0701 - 07/08 0700 In: 580.7 [P.O.:200; I.V.:380.7] Out: 1210 [Urine:1200; Blood:10] Intake/Output this shift: No intake/output data recorded.  Recent Labs    09/02/20 0334  HGB 11.0*   Recent Labs    09/02/20 0334  WBC 8.1  RBC 3.56*  HCT 35.2*  PLT 316   Recent Labs    09/02/20 0334  NA 137  K 4.9  CL 100  CO2 31  BUN 24*  CREATININE 0.85  GLUCOSE 134*  CALCIUM 9.0   No results for input(s): LABPT, INR in the last 72 hours.  Right wrist / hand: Dressing clean dry and intact. Able to wiggle fingers, sensation grossly intact throughout the fingers.     Assessment/Plan: 1 Day Post-Op Procedure(s) (LRB): TYNOSYNOVECTOMY (Right) Encouraged elevation and wiggling of fingers.  Will monitor progress today hopeful ready for discharge to home tomorrow.       Danie Hannig 09/02/2020, 7:22 AM

## 2020-09-03 DIAGNOSIS — M25431 Effusion, right wrist: Secondary | ICD-10-CM | POA: Diagnosis not present

## 2020-09-03 DIAGNOSIS — M25531 Pain in right wrist: Secondary | ICD-10-CM | POA: Diagnosis not present

## 2020-09-03 LAB — CULTURE, BLOOD (ROUTINE X 2)
Culture: NO GROWTH
Culture: NO GROWTH

## 2020-09-03 NOTE — Evaluation (Signed)
Physical Therapy Evaluation Patient Details Name: Lori Orozco MRN: [id number] DOB: 1950-11-23 Today's Date: 09/03/2020   History of Present Illness  [name] admitted with severe Rt wrist pain.  MRI showed findings concerning for inflammatory arthropathy without direct clear-cut evidence of septic arthritis.  She underwent Rt tynosynovectomy on 7/8.  PMH includes: amaurosis Fugax, anxiety, chronic fatigue syndrome, CKD, COPD, COVID, Emphysema, Fibromyalgia, IBS, Linchen sclerosus, Migraines, vertigo, dexa - osteopenia, s/p bil. TKA, s/p Lt TSA  Clinical Impression  PTA, patient lives with roommate and reports independence with mobility. Patient currently functioning at [facility] level for mobility, however deferred ambulation due to HR 140s with minimal activity and RN present reporting patient having PVCs throughout day. Patient at rest HR 130s and instructed on deep breathing to get HR down. Patient presents with generalized weakness, decreased activity tolerance, increased pain, impaired balance. Patient will benefit from skilled PT services during acute stay to address listed deficits. Recommend OPPT following discharge to address balance and strength deficits.     Follow Up Recommendations Outpatient PT    Equipment Recommendations  None recommended by PT    Recommendations for Other Services       Precautions / Restrictions Precautions Precautions: Fall Restrictions Weight Bearing Restrictions: No      Mobility  Bed Mobility Overal bed mobility: Needs Assistance Bed Mobility: Supine to Sit;Sit to Supine     Supine to sit: Min assist;HOB elevated Sit to supine: Min assist   General bed mobility comments: minA for trunk elevation and returning LEs to bed    Transfers Overall transfer level: Needs assistance Equipment used: 1 person hand held assist Transfers: Sit to/from Stand Sit to Stand: Min assist         General transfer comment: minA to rise and  steady, RN in immediately with reports of HR 140s. Returned to sitting and deferred mobility  Ambulation/Gait             General Gait Details: deferred due to HR in 140s and patient having PVCs throughout day per RN  Stairs            Wheelchair Mobility    Modified Rankin (Stroke Patients Only)       Balance Overall balance assessment: Needs assistance Sitting-balance support: Feet supported Sitting balance-Leahy Scale: Fair     Standing balance support: Single extremity supported Standing balance-Leahy Scale: Poor Standing balance comment: UE support and min A                             Pertinent Vitals/Pain Pain Assessment: 0-10 Pain Score: 9  Faces Pain Scale: Hurts worst Pain Location: Rt hand and wrist Pain Descriptors / Indicators: Aching;Burning;Sharp;Shooting;Stabbing;Constant Pain Intervention(s): Limited activity within patient's tolerance;Monitored during session;Repositioned    Home Living Family/patient expects to be discharged to:: Private residence Living Arrangements: Non-relatives/Friends Available Help at Discharge: Friend(s);Available 24 hours/day Type of Home: House Home Access: Stairs to enter Entrance Stairs-Rails: Right Entrance Stairs-Number of Steps: 1 Home Layout: Two level Home Equipment: Lori Orozco - 2 wheels;Cane - single point;Crutches;Bedside commode;Shower seat - built in Additional Comments: Pt reports she has a roommate who can assist her as needed    Prior Function Level of Independence: Independent         Comments: Pt reports she was independent, but was having difficulty standing from low surfaces such as the commode     Hand Dominance   Dominant Hand: Right  Extremity/Trunk Assessment   Upper Extremity Assessment Upper Extremity Assessment: Defer to OT evaluation RUE Deficits / Details: Pt with significant edema Rt hand, wrist and forearm.  dorsal dressing in place.  Pt demonstrates ~60-75%  passive  finger flexion each joint and each finger separately, with exception of the ring finger which is is limited to ~40% PROM due to increased pain.  She demonstrates ~ 30* extensor lag with active finger extension.  After edema management and gentle ROM, she was able to achieve ~25% composite flexion.  wrist with ~5-10* excursion flex/ext.  Supination limited to neurtral RUE Coordination: decreased gross motor;decreased fine motor    Lower Extremity Assessment Lower Extremity Assessment: Generalized weakness    Cervical / Trunk Assessment Cervical / Trunk Assessment: Normal  Communication   Communication: No difficulties  Cognition Arousal/Alertness: Awake/alert Behavior During Therapy: Anxious Overall Cognitive Status: Within Functional Limits for tasks assessed                                        General Comments      Exercises Other Exercises Other Exercises: Attempted to perform gentle AAROM of digits Rt hand, but pt became very irritable indicating worse pain than earlier today. Other Exercises: Pt provided with a ball.  She was instructed to attempt to squeeze it and to attempt to wrap her hand around it to facilitate flexion Other Exercises: Pt left with Rt hand elevated on a towel roll to facillitate flexion of digits.  Encouraged her to work on flexion and extension as well as to work on desensitization   Assessment/Plan    PT Assessment Patient needs continued PT services  PT Problem List Decreased strength;Decreased activity tolerance;Decreased balance;Decreased mobility       PT Treatment Interventions DME instruction;Gait training;Stair training;Functional mobility training;Therapeutic activities;Therapeutic exercise;Balance training;Patient/family education    PT Goals (Current goals can be found in the Care Plan section)  Acute Rehab PT Goals Patient Stated Goal: to be able to bowl again PT Goal Formulation: With patient Time For Goal  Achievement: 09/17/20 Potential to Achieve Goals: Good    Frequency Min 3X/week   Barriers to discharge        Co-evaluation               AM-PAC PT "6 Clicks" Mobility  Outcome Measure Help needed turning from your back to your side while in a flat bed without using bedrails?: A Little Help needed moving from lying on your back to sitting on the side of a flat bed without using bedrails?: A Little Help needed moving to and from a bed to a chair (including a wheelchair)?: A Little Help needed standing up from a chair using your arms (e.g., wheelchair or bedside chair)?: A Little Help needed to walk in hospital room?: A Little Help needed climbing 3-5 steps with a railing? : A Little 6 Click Score: 18    End of Session Equipment Utilized During Treatment: Gait belt Activity Tolerance: Patient limited by pain Patient left: in bed;with call bell/phone within reach;with bed alarm set Nurse Communication: Mobility status PT Visit Diagnosis: Unsteadiness on feet (R26.81);Muscle weakness (generalized) (M62.81);Difficulty in walking, not elsewhere classified (R26.2)    Time: 4098-1191 PT Time Calculation (min) (ACUTE ONLY): 26 min   Charges:   PT Evaluation $PT Eval Moderate Complexity: 1 Mod PT Treatments $Therapeutic Activity: 8-22 mins  Kayan Blissett A. Dan Humphreys PT, DPT Acute Rehabilitation Services Pager [phone] Office [phone]   Viviann Spare 09/03/2020, 5:20 PM

## 2020-09-03 NOTE — Progress Notes (Signed)
Occupational Therapy Treatment Patient Details Name: Lori Orozco MRN: [id number] DOB: [birth year] Today's Date: 09/03/2020    History of present illness [name] admitted with severe Rt wrist pain.  MRI showed findings concerning for inflammatory arthropathy without direct clear-cut evidence of septic arthritis.  She underwent Rt tynosynovectomy on 7/8.  PMH includes: amaurosis Fugax, anxiety, chronic fatigue syndrome, CKD, COPD, COVID, Emphysema, Fibromyalgia, IBS, Linchen sclerosus, Migraines, vertigo, dexa - osteopenia, s/p bil. TKA, s/p Lt TSA   OT comments  Pt provided with squeeze ball and instructed in initial HEP.  Attempted gently AAROM digits Rt hand, but pt with poor tolerance this date due to pain. We do not have compression garments in the hospital, but I doubt she will tolerate at one at this time due to severity of pain.  Will attempt to wrap her tomorrow if she can tolerate (unable to tolerate this date). Encouraged elevation and icing.   Follow Up Recommendations  Outpatient OT;Supervision/Assistance - 24 hour    Equipment Recommendations  None recommended by OT    Recommendations for Other Services PT consult    Precautions / Restrictions Precautions Precautions: Fall       Mobility Bed Mobility                    Transfers                      Balance                                           ADL either performed or assessed with clinical judgement   ADL                                               Vision       Perception     Praxis      Cognition Arousal/Alertness: Awake/alert Behavior During Therapy: Anxious Overall Cognitive Status: Within Functional Limits for tasks assessed                                          Exercises Exercises: Other exercises Other Exercises Other Exercises: Attempted to perform gentle AAROM of digits Rt hand, but pt became  very irritable indicating worse pain than earlier today. Other Exercises: Pt provided with a ball.  She was instructed to attempt to squeeze it and to attempt to wrap her hand around it to facilitate flexion Other Exercises: Pt left with Rt hand elevated on a towel roll to facillitate flexion of digits.  Encouraged her to work on flexion and extension as well as to work on desensitization   Shoulder Instructions       General Comments      Pertinent Vitals/ Pain       Pain Assessment: Faces Faces Pain Scale: Hurts worst Pain Location: Rt hand and wrist Pain Descriptors / Indicators: Aching;Burning;Sharp;Shooting;Stabbing;Constant Pain Intervention(s): Limited activity within patient's tolerance;Premedicated before session;Repositioned  Home Living  Prior Functioning/Environment              Frequency  Min 3X/week        Progress Toward Goals  OT Goals(current goals can now be found in the care plan section)  Progress towards OT goals: Progressing toward goals     Plan Discharge plan remains appropriate    Co-evaluation                 AM-PAC OT "6 Clicks" Daily Activity     Outcome Measure   Help from another person eating meals?: A Little Help from another person taking care of personal grooming?: A Little Help from another person toileting, which includes using toliet, bedpan, or urinal?: A Lot Help from another person bathing (including washing, rinsing, drying)?: A Lot Help from another person to put on and taking off regular upper body clothing?: A Lot Help from another person to put on and taking off regular lower body clothing?: A Lot 6 Click Score: 14    End of Session    OT Visit Diagnosis: Pain Pain - Right/Left: Right Pain - part of body: Hand;Arm   Activity Tolerance Patient limited by pain   Patient Left in bed;with call bell/phone within reach;with family/visitor present    Nurse Communication Mobility status        Time: 0109-3235 OT Time Calculation (min): 9 min  Charges: OT General Charges $OT Visit: 1 Visit OT Treatments $Therapeutic Exercise: 8-22 mins  Eber Jones., OTR/L Acute Rehabilitation Services Pager [phone] Office [phone]    Jeani Hawking M 09/03/2020, 4:37 PM

## 2020-09-03 NOTE — Progress Notes (Signed)
     Subjective: 2 Days Post-Op Procedure(s) (LRB): TYNOSYNOVECTOMY (Right) Awake. Alert and oriented x 4. Right hand is swollen, dorsal dressing mepilex no compression. Right hand elevated above heart. Complaints of pain, not moving right fingers well.  Patient reports pain as moderate.    Objective:   VITALS:  Temp:  [98 F (36.7 C)-99.2 F (37.3 C)] 99.2 F (37.3 C) (07/09 0810) Pulse Rate:  [84-95] 94 (07/09 0810) Resp:  [15-20] 15 (07/09 0512) BP: (138-161)/(59-72) 161/68 (07/09 0810) SpO2:  [90 %-94 %] 94 % (07/09 0810)  Neurologically intact ABD soft Neurovascular intact Sensation intact distally Intact pulses distally   LABS Recent Labs    09/02/20 0334  HGB 11.0*  WBC 8.1  PLT 316   Recent Labs    09/02/20 0334  NA 137  K 4.9  CL 100  CO2 31  BUN 24*  CREATININE 0.85  GLUCOSE 134*   No results for input(s): LABPT, INR in the last 72 hours.   Assessment/Plan: 2 Days Post-Op Procedure(s) (LRB): TYNOSYNOVECTOMY (Right)  Advance diet Up with therapy Needs OT and compression therapy with compression garment to decrease swelling and improve venous out flow.   Vira Browns 09/03/2020, 10:09 AM Patient ID: Lori Orozco, female   DOB: May 29, 1950, 70 y.o.   MRN: [id number]

## 2020-09-03 NOTE — Evaluation (Addendum)
Occupational Therapy Evaluation Patient Details Name: Lori Orozco MRN: [id number] DOB: June 13, 1950 Today's Date: 09/03/2020    History of Present Illness [name] admitted with severe Rt wrist pain.  MRI showed findings concerning for inflammatory arthropathy without direct clear-cut evidence of septic arthritis.  She underwent Rt tynosynovectomy on 7/8.  PMH includes: amaurosis Fugax, anxiety, chronic fatigue syndrome, CKD, COPD, COVID, Emphysema, Fibromyalgia, IBS, Linchen sclerosus, Migraines, vertigo, dexa - osteopenia, s/p bil. TKA, s/p Lt TSA   Clinical Impression   Pt admitted with above. She demonstrates the below listed deficits and will benefit from continued OT to maximize safety and independence with BADLs.  Pt presents to OT with significant pain and edema of Rt hand as well as limitations in ROM of hand, wrist, forearm.  She also demonstrates generalized weakness due to immobility,  decreased balance, and decreased activity tolerance.  She currently requires mod A, overall for ADLs and min A for functional transfers.  She lives with her roommate, and reports she was independent PTA.  OT will follow acutely, but she will benefit from continued OPOT at discharge.  Pt reports she currently does have a splint for her Rt hand, and will have her roommate bring it back to the hospital, although I am doubtful she will be able to tolerate wearing it due to hypersensitivity and pain.      Follow Up Recommendations  Outpatient OT;Supervision/Assistance - 24 hour    Equipment Recommendations  None recommended by OT    Recommendations for Other Services PT consult     Precautions / Restrictions Precautions Precautions: Fall      Mobility Bed Mobility Overal bed mobility: Needs Assistance Bed Mobility: Supine to Sit     Supine to sit: Min assist;HOB elevated     General bed mobility comments: assist to lift trunk - heavy reliance on bedrail     Transfers Overall transfer level: Needs assistance Equipment used: 1 person hand held assist Transfers: Sit to/from Stand;Stand Pivot Transfers Sit to Stand: Min assist Stand pivot transfers: Min assist       General transfer comment: assist to boost into standing and assist to steady during pivot transfer    Balance Overall balance assessment: Needs assistance Sitting-balance support: Feet supported Sitting balance-Leahy Scale: Fair     Standing balance support: Single extremity supported Standing balance-Leahy Scale: Poor Standing balance comment: UE support and min A                           ADL either performed or assessed with clinical judgement   ADL Overall ADL's : Needs assistance/impaired Eating/Feeding: Set up;Sitting   Grooming: Wash/dry hands;Wash/dry face;Oral care;Brushing hair;Minimal assistance;Sitting   Upper Body Bathing: Moderate assistance;Sitting   Lower Body Bathing: Moderate assistance;Sit to/from stand   Upper Body Dressing : Moderate assistance;Sitting   Lower Body Dressing: Moderate assistance;Sit to/from stand   Toilet Transfer: Minimal assistance;Stand-pivot;BSC   Toileting- Clothing Manipulation and Hygiene: Moderate assistance;Sit to/from stand       Functional mobility during ADLs: Minimal assistance General ADL Comments: limited by generalized weakness and Rt UE pain     Vision Baseline Vision/History: Wears glasses Wears Glasses: Reading only Patient Visual Report: No change from baseline       Perception     Praxis      Pertinent Vitals/Pain Pain Assessment: 0-10 Pain Score: 9  Pain Location: Rt hand and wrist Pain Descriptors / Indicators: Aching;Burning;Sharp;Shooting;Stabbing;Constant Pain Intervention(s):  Limited activity within patient's tolerance;Monitored during session;Repositioned;Premedicated before session     Hand Dominance Right   Extremity/Trunk Assessment Upper Extremity  Assessment Upper Extremity Assessment: RUE deficits/detail RUE Deficits / Details: Pt with significant edema Rt hand, wrist and forearm.  dorsal dressing in place.  Pt demonstrates ~60-75% passive  finger flexion each joint and each finger separately, with exception of the ring finger which is is limited to ~40% PROM due to increased pain.  She demonstrates ~ 30* extensor lag with active finger extension.  After edema management and gentle ROM, she was able to achieve ~25% composite flexion.  wrist with ~5-10* excursion flex/ext.  Supination limited to neurtral RUE Coordination: decreased gross motor;decreased fine motor   Lower Extremity Assessment Lower Extremity Assessment: Defer to PT evaluation   Cervical / Trunk Assessment Cervical / Trunk Assessment: Normal   Communication Communication Communication: No difficulties   Cognition Arousal/Alertness: Awake/alert Behavior During Therapy: Anxious Overall Cognitive Status: Within Functional Limits for tasks assessed                                     General Comments  RN present at end of session and reports that pt had run of SVT and some PVCs - EKG ordered    Exercises Exercises: Hand exercises;General Upper Extremity General Exercises - Upper Extremity Shoulder Flexion: AROM;Right;10 reps;Supine Elbow Flexion: AROM;Right;10 reps;Supine Elbow Extension: AROM;Right;10 reps;Supine Wrist Extension: AAROM;Right;10 reps;Supine Digit Composite Flexion: PROM;AAROM;Right;10 reps;Supine Composite Extension: PROM;AAROM;Right;10 reps;Supine Hand Exercises Forearm Supination: AAROM;Right;10 reps;Supine Forearm Pronation: AROM;Right;10 reps;Supine Wrist Flexion: PROM;AAROM;Right;10 reps;Supine Wrist Extension: PROM;AAROM;Right;10 reps;Supine Thumb Abduction: PROM;AAROM;Right;10 reps;Seated Thumb Adduction: PROM;Right;10 reps;Supine;AAROM   Shoulder Instructions      Home Living Family/patient expects to be discharged  to:: Private residence Living Arrangements: Non-relatives/Friends Available Help at Discharge: Friend(s);Available 24 hours/day Type of Home: House Home Access: Stairs to enter [employer] of Steps: 1 Entrance Stairs-Rails: Right Home Layout: Two level Alternate Level Stairs-Number of Steps: stair lift   Bathroom Shower/Tub: Producer, television/film/video: Standard Bathroom Accessibility: Yes   Home Equipment: Environmental consultant - 2 wheels;Cane - single point;Crutches;Bedside commode;Shower seat - built in   Additional Comments: Pt reports she has a roommate who can assist her as needed      Prior Functioning/Environment Level of Independence: Independent        Comments: Pt reports she was independent, but was having difficulty standing from low surfaces such as the commode        OT Problem List: Decreased strength;Decreased range of motion;Decreased activity tolerance;Impaired balance (sitting and/or standing);Decreased coordination;Decreased knowledge of use of DME or AE;Impaired UE functional use;Pain;Increased edema      OT Treatment/Interventions: Self-care/ADL training;Therapeutic exercise;Neuromuscular education;DME and/or AE instruction;Manual therapy;Splinting;Therapeutic activities;Patient/family education;Balance training    OT Goals(Current goals can be found in the care plan section) Acute Rehab OT Goals Patient Stated Goal: to be able to bowl again OT Goal Formulation: With patient Time For Goal Achievement: 09/17/20 Potential to Achieve Goals: Good  OT Frequency: Min 3X/week   Barriers to D/C:            Co-evaluation              AM-PAC OT "6 Clicks" Daily Activity     Outcome Measure Help from another person eating meals?: A Little Help from another person taking care of personal grooming?: A Little Help from another person toileting, which includes  using toliet, bedpan, or urinal?: A Lot Help from another person bathing (including  washing, rinsing, drying)?: A Lot Help from another person to put on and taking off regular upper body clothing?: A Lot Help from another person to put on and taking off regular lower body clothing?: A Lot 6 Click Score: 14   End of Session Nurse Communication: Mobility status  Activity Tolerance: Patient limited by pain Patient left: in chair;with call bell/phone within reach;with nursing/sitter in room  OT Visit Diagnosis: Pain Pain - Right/Left: Right Pain - part of body: Hand;Arm                Time: 0071-2197 OT Time Calculation (min): 52 min Charges:  1 mod eval; 2 therapeutic activities    Eber Jones., OTR/L Acute Rehabilitation Services Pager [phone] Office [phone]   Jeani Hawking M 09/03/2020, 12:03 PM

## 2020-09-03 NOTE — Progress Notes (Signed)
PROGRESS NOTE  Lori Orozco WER:[id number] DOB: 1951/01/16 DOA: 08/28/2020 PCP: [name], MD   LOS: 4 days   Brief Narrative / Interim history: 70 year old female with hypothyroidism, hyperlipidemia, HTN here with right wrist pain, erythema and swelling.  Orthopedic surgery saw her as an outpatient, had trialed a Medrol Dosepak without success and she eventually decided to come to the ED.  Orthopedic surgery consulted  Subjective / 24h Interval events: Swelling is back to her wrist, complains of increased pain.  Assessment & Plan: Principal Problem Pain and swelling of the right wrist-imaging showed no evidence of bony destruction.  Orthopedic surgery consulted and followed patient while hospitalized.  She underwent an MRI which showed findings concerning for inflammatory arthropathy without direct clear-cut evidence of septic arthritis.  She had a wrist aspirate as an outpatient and that was culture negative.  She was given a trial of steroids which initially helped then pain reoccurred.  Given lack of improvement with steroids she was taken to the OR on 7/7 and she is status post right wrist dorsal compartment extensor tendon tenosynovectomy. -She was improving yesterday but today has increased swelling.  Discussed with Dr. Otelia Sergeant, ordered OT and compression therapy to decrease swelling.  We will need to continue to closely monitor  Active Problems GAD-continue home medications COPD-stable, continue home medications Hypothyroidism-stable, continue Synthroid Hyperlipidemia-continue statin   Scheduled Meds:  buPROPion  150 mg Oral Daily   busPIRone  15 mg Oral BID AC   divalproex  500 mg Oral QHS   estrogens (conjugated)  0.625 mg Oral Daily   levothyroxine  50 mcg Oral QHS   mirabegron ER  25 mg Oral QHS   mirtazapine  30 mg Oral QHS   pantoprazole  40 mg Oral BID   sertraline  100 mg Oral Daily   simvastatin  20 mg Oral QHS   Continuous Infusions:  sodium chloride 10  mL/hr at 08/31/20 0024   PRN Meds:.sodium chloride, acetaminophen **OR** acetaminophen, albuterol, fluticasone, hydrALAZINE, HYDROmorphone (DILAUDID) injection **OR** HYDROmorphone (DILAUDID) injection, ipratropium-albuterol, ketotifen, LORazepam, ondansetron **OR** ondansetron (ZOFRAN) IV, oxyCODONE, polyethylene glycol  Diet Orders (From admission, onward)     Start     Ordered   09/01/20 2105  Diet regular Room service appropriate? Yes; Fluid consistency: Thin  Diet effective now       Question Answer Comment  Room service appropriate? Yes   Fluid consistency: Thin      09/01/20 2104            DVT prophylaxis: SCDs Start: 08/28/20 2326     Code Status: Full Code  Family Communication: no family at bedside   Status is: Inpatient  Remains inpatient appropriate because:Inpatient level of care appropriate due to severity of illness  Dispo: The patient is from: Home              Anticipated d/c is to: Home              Patient currently is not medically stable to d/c.   Difficult to place patient No  Level of care: Med-Surg  Consultants:  Orthopedic surgery  Procedures:  None   Microbiology  None   Antimicrobials: cefepime    Objective: Vitals:   09/02/20 1119 09/02/20 2002 09/03/20 0512 09/03/20 0810  BP: (!) 144/59 138/62 (!) 158/72 (!) 161/68  Pulse: 95 88 84 94  Resp: 20 16 15    Temp: 98.6 F (37 C) 98.6 F (37 C) 98 F (36.7 C) 99.2  F (37.3 C)  TempSrc: Oral Oral Oral Oral  SpO2: 90% 91% 92% 94%  Weight:      Height:        Intake/Output Summary (Last 24 hours) at 09/03/2020 1036 Last data filed at 09/03/2020 0500 Gross per 24 hour  Intake 240 ml  Output 1610 ml  Net -1370 ml    Filed Weights   08/29/20 0122  Weight: 88 kg    Examination:  Constitutional: Seems uncomfortable Eyes: No icterus ENMT: mmm Neck: normal, supple Cardiovascular: No edema Abdomen: Nondistended, bowel sounds positive Musculoskeletal: Right wrist with  increased swelling Skin: No rashes seen Neurologic: No focal deficits   Data Reviewed: I have independently reviewed following labs and imaging studies  CBC: Recent Labs  Lab 08/28/20 2033 08/29/20 0412 09/02/20 0334  WBC 7.8 10.4 8.1  NEUTROABS 6.3 6.7  --   HGB 11.5* 10.8* 11.0*  HCT 35.4* 33.3* 35.2*  MCV 97.0 97.9 98.9  PLT 327 306 316    Basic Metabolic Panel: Recent Labs  Lab 08/28/20 2033 08/29/20 0412 09/02/20 0334  NA 135 136 137  K 4.6 4.1 4.9  CL 99 100 100  CO2 28 27 31   GLUCOSE 128* 98 134*  BUN 29* 29* 24*  CREATININE 0.97 0.84 0.85  CALCIUM 9.3 8.9 9.0    Liver Function Tests: Recent Labs  Lab 08/28/20 2033 08/29/20 0412  AST 16 11*  ALT 14 13  ALKPHOS 63 59  BILITOT 0.4 0.6  PROT 6.4* 5.8*  ALBUMIN 2.9* 2.7*    Coagulation Profile: Recent Labs  Lab 08/29/20 0412  INR 1.0    HbA1C: No results for input(s): HGBA1C in the last 72 hours.  CBG: No results for input(s): GLUCAP in the last 168 hours.  Recent Results (from the past 240 hour(s))  Culture, blood (routine x 2)     Status: None   Collection Time: 08/29/20  1:10 AM   Specimen: BLOOD  Result Value Ref Range Status   Specimen Description BLOOD RIGHT ANTECUBITAL  Final   Special Requests   Final    BOTTLES DRAWN AEROBIC AND ANAEROBIC Blood Culture results may not be optimal due to an excessive volume of blood received in culture bottles   Culture   Final    NO GROWTH 5 DAYS Performed at [facility], [address], [place], [place] [address]    Report Status 09/03/2020 FINAL  Final  Culture, blood (routine x 2)     Status: None   Collection Time: 08/29/20  1:38 AM   Specimen: BLOOD LEFT HAND  Result Value Ref Range Status   Specimen Description BLOOD LEFT HAND  Final   Special Requests   Final    BOTTLES DRAWN AEROBIC AND ANAEROBIC Blood Culture results may not be optimal due to an excessive volume of blood received in culture bottles   Culture   Final    NO  GROWTH 5 DAYS Performed at [facility], [address], [place], [place] [address]    Report Status 09/03/2020 FINAL  Final  Culture, Urine     Status: None   Collection Time: 08/29/20  4:27 AM   Specimen: Urine, Clean Catch  Result Value Ref Range Status   Specimen Description URINE, CLEAN CATCH  Final   Special Requests NONE  Final   Culture   Final    NO GROWTH Performed at [facility], [address], [place], [place] [address]  Report Status 08/30/2020 FINAL  Final  Resp Panel by RT-PCR (Flu A&B, Covid) Nasopharyngeal Swab     Status: None   Collection Time: 08/29/20  5:55 AM   Specimen: Nasopharyngeal Swab; Nasopharyngeal(NP) swabs in vial transport medium  Result Value Ref Range Status   SARS Coronavirus 2 by RT PCR NEGATIVE NEGATIVE Final    Comment: (NOTE) SARS-CoV-2 target nucleic acids are NOT DETECTED.  The SARS-CoV-2 RNA is generally detectable in upper respiratory specimens during the acute phase of infection. The lowest concentration of SARS-CoV-2 viral copies this assay can detect is 138 copies/mL. A negative result does not preclude SARS-Cov-2 infection and should not be used as the sole basis for treatment or other patient management decisions. A negative result may occur with  improper specimen collection/handling, submission of specimen other than nasopharyngeal swab, presence of viral mutation(s) within the areas targeted by this assay, and inadequate number of viral copies(<138 copies/mL). A negative result must be combined with clinical observations, patient history, and epidemiological information. The expected result is Negative.  Fact Sheet for Patients:  BloggerCourse.com  Fact Sheet for Healthcare Providers:  SeriousBroker.it  This test is no t yet approved or cleared by the [place] FDA and  has been authorized for detection and/or diagnosis of SARS-CoV-2 by FDA under an  Emergency Use Authorization (EUA). This EUA will remain  in effect (meaning this test can be used) for the duration of the COVID-19 declaration under Section 564(b)(1) of the Act, 21 U.S.C.section 360bbb-3(b)(1), unless the authorization is terminated  or revoked sooner.       Influenza A by PCR NEGATIVE NEGATIVE Final   Influenza B by PCR NEGATIVE NEGATIVE Final    Comment: (NOTE) The Xpert Xpress SARS-CoV-2/FLU/RSV plus assay is intended as an aid in the diagnosis of influenza from Nasopharyngeal swab specimens and should not be used as a sole basis for treatment. Nasal washings and aspirates are unacceptable for Xpert Xpress SARS-CoV-2/FLU/RSV testing.  Fact Sheet for Patients: BloggerCourse.com  Fact Sheet for Healthcare Providers: SeriousBroker.it  This test is not yet approved or cleared by the [place] FDA and has been authorized for detection and/or diagnosis of SARS-CoV-2 by FDA under an Emergency Use Authorization (EUA). This EUA will remain in effect (meaning this test can be used) for the duration of the COVID-19 declaration under Section 564(b)(1) of the Act, 21 U.S.C. section 360bbb-3(b)(1), unless the authorization is terminated or revoked.  Performed at [facility], [address] [place], [place] [address]   Surgical pcr screen     Status: None   Collection Time: 09/01/20 12:03 PM   Specimen: Nasal Mucosa; Nasal Swab  Result Value Ref Range Status   MRSA, PCR NEGATIVE NEGATIVE Final   Staphylococcus aureus NEGATIVE NEGATIVE Final    Comment: (NOTE) The Xpert SA Assay (FDA approved for NASAL specimens in patients 51 years of age and older), is one component of a comprehensive surveillance program. It is not intended to diagnose infection nor to guide or monitor treatment. Performed at [facility] Lab, [address] [place], [place] [address]       Radiology Studies: No results  found.   Pamella Pert, MD, PhD [facility]  Between 7 am - 7 pm I am available, please contact me via Amion (for emergencies) or Securechat (non urgent messages)  Between 7 pm - 7 am I am not available, please contact night coverage MD/APP via Amion

## 2020-09-04 DIAGNOSIS — M25531 Pain in right wrist: Secondary | ICD-10-CM | POA: Diagnosis not present

## 2020-09-04 DIAGNOSIS — M25431 Effusion, right wrist: Secondary | ICD-10-CM | POA: Diagnosis not present

## 2020-09-04 LAB — CBC
HCT: 30.6 % — ABNORMAL LOW (ref 36.0–46.0)
Hemoglobin: 9.7 g/dL — ABNORMAL LOW (ref 12.0–15.0)
MCH: 31.1 pg (ref 26.0–34.0)
MCHC: 31.7 g/dL (ref 30.0–36.0)
MCV: 98.1 fL (ref 80.0–100.0)
Platelets: 323 10*3/uL (ref 150–400)
RBC: 3.12 MIL/uL — ABNORMAL LOW (ref 3.87–5.11)
RDW: 14.5 % (ref 11.5–15.5)
WBC: 9.1 10*3/uL (ref 4.0–10.5)
nRBC: 0 % (ref 0.0–0.2)

## 2020-09-04 LAB — BASIC METABOLIC PANEL
Anion gap: 5 (ref 5–15)
BUN: 21 mg/dL (ref 8–23)
CO2: 35 mmol/L — ABNORMAL HIGH (ref 22–32)
Calcium: 9.1 mg/dL (ref 8.9–10.3)
Chloride: 96 mmol/L — ABNORMAL LOW (ref 98–111)
Creatinine, Ser: 0.86 mg/dL (ref 0.44–1.00)
GFR, Estimated: >60 mL/min (ref >60)
Glucose, Bld: 111 mg/dL — ABNORMAL HIGH (ref 70–99)
Potassium: 3.9 mmol/L (ref 3.5–5.1)
Sodium: 136 mmol/L (ref 135–145)

## 2020-09-04 LAB — TSH: TSH: 1.584 u[IU]/mL (ref 0.350–4.500)

## 2020-09-04 MED ORDER — METOPROLOL TARTRATE 12.5 MG HALF TABLET
12.5000 mg | ORAL_TABLET | Freq: Two times a day (BID) | ORAL | Status: DC
  Administered 2020-09-04 – 2020-09-07 (×7): 12.5 mg via ORAL
  Filled 2020-09-04 (×7): qty 1

## 2020-09-04 NOTE — Plan of Care (Signed)
  Problem: Education: Goal: Knowledge of General Education information will improve Description: Including pain rating scale, medication(s)/side effects and non-pharmacologic comfort measures Outcome: Progressing   Problem: Activity: Goal: Risk for activity intolerance will decrease Outcome: Progressing   

## 2020-09-04 NOTE — Plan of Care (Signed)

## 2020-09-04 NOTE — Progress Notes (Signed)
     Subjective: 3 Days Post-Op Procedure(s) (LRB): TYNOSYNOVECTOMY (Right) Awake, alert and Oriented x 4. Swelling right hand, PT and OT orders are in and she was seen by PT and OT and needs a compression garment and intense PT  ? Whether a scalene block may help as she has a RSD pattern of swelling and painful use of the entire hand.  Patient reports pain as moderate.    Objective:   VITALS:  Temp:  [98.2 F (36.8 C)-99.7 F (37.6 C)] 99.1 F (37.3 C) (07/10 0815) Pulse Rate:  [91-105] 104 (07/10 0815) Resp:  [16-17] 17 (07/10 0815) BP: (147-166)/(57-70) 166/66 (07/10 0815) SpO2:  [89 %-95 %] 95 % (07/10 0815)  Neurologically intact ABD soft Neurovascular intact Sensation intact distally Intact pulses distally Dorsiflexion/Plantar flexion intact Incision: dressing C/D/I and no drainage   LABS Recent Labs    09/02/20 0334 09/04/20 0405  HGB 11.0* 9.7*  WBC 8.1 9.1  PLT 316 323   Recent Labs    09/02/20 0334 09/04/20 0405  NA 137 136  K 4.9 3.9  CL 100 96*  CO2 31 35*  BUN 24* 21  CREATININE 0.85 0.86  GLUCOSE 134* 111*   No results for input(s): LABPT, INR in the last 72 hours.   Assessment/Plan: 3 Days Post-Op Procedure(s) (LRB): TYNOSYNOVECTOMY (Right)  Up with therapy  Vira Browns 09/04/2020, 11:55 AM Patient ID: Lori Orozco, female   DOB: 1950-12-08, 70 y.o.   MRN: [id number]

## 2020-09-04 NOTE — Progress Notes (Signed)
Lori Orozco PROGRESS NOTE  Lori Orozco ZOX:[id number] DOB: 1950-12-11 DOA: 08/28/2020 PCP: [name], MD   LOS: 5 days   Brief Narrative / Interim history: 70 year old female with hypothyroidism, hyperlipidemia, HTN here with right wrist pain, erythema and swelling.  Orthopedic surgery saw her as an outpatient, had trialed a Medrol Dosepak without success and she eventually decided to come to the ED.  Orthopedic surgery consulted  Subjective / 24h Interval events: More pain and swelling this morning.  She has increased redness.  Assessment & Plan: Principal Problem Pain and swelling of the right wrist-imaging showed no evidence of bony destruction.  Orthopedic surgery consulted and followed patient while hospitalized.  She underwent an MRI which showed findings concerning for inflammatory arthropathy without direct clear-cut evidence of septic arthritis.  She had a wrist aspirate as an outpatient and that was culture negative.  She was given a trial of steroids which initially helped then pain reoccurred.  Given lack of improvement with steroids she was taken to the OR on 7/7 and she is status post right wrist dorsal compartment extensor tendon tenosynovectomy. -While initially improved on POD 1, now getting worse again -Rule out rheumatological causes with monoarticular involvement, obtain ANA, rheumatoid factor, anti-CCP.  CRP and sed rate high.  However it should improve with steroids  Active Problems GAD-continue home medications as below COPD-stable, continue home medications, no wheezing.  She is intermittently dyspneic with activity Hypothyroidism-stable, continue Synthroid Hyperlipidemia-continue statin Sinus tach-she has been more tachycardic in the last day, probably due to increased pain.  Heart rate was up to the 140s yesterday.  TSH was normal.  We will add low-dose metoprolol, that will help with the blood pressure as well Essential hypertension-now started on  metoprolol  Scheduled Meds:  buPROPion  150 mg Oral Daily   busPIRone  15 mg Oral BID AC   divalproex  500 mg Oral QHS   estrogens (conjugated)  0.625 mg Oral Daily   levothyroxine  50 mcg Oral QHS   metoprolol tartrate  12.5 mg Oral BID   mirabegron ER  25 mg Oral QHS   mirtazapine  30 mg Oral QHS   pantoprazole  40 mg Oral BID   sertraline  100 mg Oral Daily   simvastatin  20 mg Oral QHS   Continuous Infusions:  sodium chloride 10 mL/hr at 08/31/20 0024   PRN Meds:.sodium chloride, acetaminophen **OR** acetaminophen, albuterol, fluticasone, hydrALAZINE, HYDROmorphone (DILAUDID) injection **OR** HYDROmorphone (DILAUDID) injection, ipratropium-albuterol, ketotifen, LORazepam, ondansetron **OR** ondansetron (ZOFRAN) IV, oxyCODONE, polyethylene glycol  Diet Orders (From admission, onward)     Start     Ordered   09/01/20 2105  Diet regular Room service appropriate? Yes; Fluid consistency: Thin  Diet effective now       Question Answer Comment  Room service appropriate? Yes   Fluid consistency: Thin      09/01/20 2104            DVT prophylaxis: SCDs Start: 08/28/20 2326     Code Status: Full Code  Family Communication: no family at bedside   Status is: Inpatient  Remains inpatient appropriate because:Inpatient level of care appropriate due to severity of illness  Dispo: The patient is from: Home              Anticipated d/c is to: Home              Patient currently is not medically stable to d/c.   Difficult to place patient No  Level of care: Med-Surg  Consultants:  Orthopedic surgery  Procedures:  None   Microbiology  None   Antimicrobials: None   Objective: Vitals:   09/03/20 1138 09/03/20 1626 09/03/20 2032 09/04/20 0815  BP: (!) 145/63 (!) 161/70 (!) 147/57 (!) 166/66  Pulse: 88 (!) 105 91 (!) 104  Resp:  16 16 17   Temp:  99.7 F (37.6 C) 98.2 F (36.8 C) 99.1 F (37.3 C)  TempSrc:  Oral Oral Oral  SpO2: 91% (!) 89% 90% 95%  Weight:       Height:        Intake/Output Summary (Last 24 hours) at 09/04/2020 1059 Last data filed at 09/04/2020 1000 Gross per 24 hour  Intake 0 ml  Output 700 ml  Net -700 ml    Filed Weights   08/29/20 0122  Weight: 88 kg    Examination:  Constitutional: Appears uncomfortable due to pain Eyes: No scleral icterus ENMT: mmm Neck: normal, supple Cardiovascular: Regular rate and rhythm, no edema Abdomen: Nondistended, bowel sounds positive Musculoskeletal: Increased swelling and erythema to the right wrist Skin: No rash appreciated Neurologic: Nonfocal   Data Reviewed: I have independently reviewed following labs and imaging studies  CBC: Recent Labs  Lab 08/28/20 2033 08/29/20 0412 09/02/20 0334 09/04/20 0405  WBC 7.8 10.4 8.1 9.1  NEUTROABS 6.3 6.7  --   --   HGB 11.5* 10.8* 11.0* 9.7*  HCT 35.4* 33.3* 35.2* 30.6*  MCV 97.0 97.9 98.9 98.1  PLT 327 306 316 323    Basic Metabolic Panel: Recent Labs  Lab 08/28/20 2033 08/29/20 0412 09/02/20 0334 09/04/20 0405  NA 135 136 137 136  K 4.6 4.1 4.9 3.9  CL 99 100 100 96*  CO2 28 27 31  35*  GLUCOSE 128* 98 134* 111*  BUN 29* 29* 24* 21  CREATININE 0.97 0.84 0.85 0.86  CALCIUM 9.3 8.9 9.0 9.1    Liver Function Tests: Recent Labs  Lab 08/28/20 2033 08/29/20 0412  AST 16 11*  ALT 14 13  ALKPHOS 63 59  BILITOT 0.4 0.6  PROT 6.4* 5.8*  ALBUMIN 2.9* 2.7*    Coagulation Profile: Recent Labs  Lab 08/29/20 0412  INR 1.0    HbA1C: No results for input(s): HGBA1C in the last 72 hours.  CBG: No results for input(s): GLUCAP in the last 168 hours.  Recent Results (from the past 240 hour(s))  Culture, blood (routine x 2)     Status: None   Collection Time: 08/29/20  1:10 AM   Specimen: BLOOD  Result Value Ref Range Status   Specimen Description BLOOD RIGHT ANTECUBITAL  Final   Special Requests   Final    BOTTLES DRAWN AEROBIC AND ANAEROBIC Blood Culture results may not be optimal due to an excessive  volume of blood received in culture bottles   Culture   Final    NO GROWTH 5 DAYS Performed at [facility], [address], [place], [place] [address]    Report Status 09/03/2020 FINAL  Final  Culture, blood (routine x 2)     Status: None   Collection Time: 08/29/20  1:38 AM   Specimen: BLOOD LEFT HAND  Result Value Ref Range Status   Specimen Description BLOOD LEFT HAND  Final   Special Requests   Final    BOTTLES DRAWN AEROBIC AND ANAEROBIC Blood Culture results may not be optimal due to an excessive volume of blood received in culture bottles   Culture  Final    NO GROWTH 5 DAYS Performed at [facility], [address] [place], [place] [address]    Report Status 09/03/2020 FINAL  Final  Culture, Urine     Status: None   Collection Time: 08/29/20  4:27 AM   Specimen: Urine, Clean Catch  Result Value Ref Range Status   Specimen Description URINE, CLEAN CATCH  Final   Special Requests NONE  Final   Culture   Final    NO GROWTH Performed at [facility], [address], [place], [place] [address]    Report Status 08/30/2020 FINAL  Final  Resp Panel by RT-PCR (Flu A&B, Covid) Nasopharyngeal Swab     Status: None   Collection Time: 08/29/20  5:55 AM   Specimen: Nasopharyngeal Swab; Nasopharyngeal(NP) swabs in vial transport medium  Result Value Ref Range Status   SARS Coronavirus 2 by RT PCR NEGATIVE NEGATIVE Final    Comment: (NOTE) SARS-CoV-2 target nucleic acids are NOT DETECTED.  The SARS-CoV-2 RNA is generally detectable in upper respiratory specimens during the acute phase of infection. The lowest concentration of SARS-CoV-2 viral copies this assay can detect is 138 copies/mL. A negative result does not preclude SARS-Cov-2 infection and should not be used as the sole basis for treatment or other patient management decisions. A negative result may occur with  improper specimen collection/handling, submission of specimen other than nasopharyngeal  swab, presence of viral mutation(s) within the areas targeted by this assay, and inadequate number of viral copies(<138 copies/mL). A negative result must be combined with clinical observations, patient history, and epidemiological information. The expected result is Negative.  Fact Sheet for Patients:  BloggerCourse.com  Fact Sheet for Healthcare Providers:  SeriousBroker.it  This test is no t yet approved or cleared by the [place] FDA and  has been authorized for detection and/or diagnosis of SARS-CoV-2 by FDA under an Emergency Use Authorization (EUA). This EUA will remain  in effect (meaning this test can be used) for the duration of the COVID-19 declaration under Section 564(b)(1) of the Act, 21 U.S.C.section 360bbb-3(b)(1), unless the authorization is terminated  or revoked sooner.       Influenza A by PCR NEGATIVE NEGATIVE Final   Influenza B by PCR NEGATIVE NEGATIVE Final    Comment: (NOTE) The Xpert Xpress SARS-CoV-2/FLU/RSV plus assay is intended as an aid in the diagnosis of influenza from Nasopharyngeal swab specimens and should not be used as a sole basis for treatment. Nasal washings and aspirates are unacceptable for Xpert Xpress SARS-CoV-2/FLU/RSV testing.  Fact Sheet for Patients: BloggerCourse.com  Fact Sheet for Healthcare Providers: SeriousBroker.it  This test is not yet approved or cleared by the [place] FDA and has been authorized for detection and/or diagnosis of SARS-CoV-2 by FDA under an Emergency Use Authorization (EUA). This EUA will remain in effect (meaning this test can be used) for the duration of the COVID-19 declaration under Section 564(b)(1) of the Act, 21 U.S.C. section 360bbb-3(b)(1), unless the authorization is terminated or revoked.  Performed at [facility], [address], [place], [place] [address]   Surgical pcr  screen     Status: None   Collection Time: 09/01/20 12:03 PM   Specimen: Nasal Mucosa; Nasal Swab  Result Value Ref Range Status   MRSA, PCR NEGATIVE NEGATIVE Final   Staphylococcus aureus NEGATIVE NEGATIVE Final    Comment: (NOTE) The Xpert SA Assay (FDA approved for NASAL specimens in patients 97 years of age and  older), is one component of a comprehensive surveillance program. It is not intended to diagnose infection nor to guide or monitor treatment. Performed at [facility], [address], [place], [place] [address]       Radiology Studies: No results found.   Pamella Pert, MD, PhD [facility]  Between 7 am - 7 pm I am available, please contact me via Amion (for emergencies) or Securechat (non urgent messages)  Between 7 pm - 7 am I am not available, please contact night coverage MD/APP via Amion

## 2020-09-05 ENCOUNTER — Encounter (HOSPITAL_COMMUNITY): Payer: Self-pay | Admitting: Internal Medicine

## 2020-09-05 DIAGNOSIS — M25531 Pain in right wrist: Secondary | ICD-10-CM | POA: Diagnosis not present

## 2020-09-05 DIAGNOSIS — M25431 Effusion, right wrist: Secondary | ICD-10-CM | POA: Diagnosis not present

## 2020-09-05 LAB — CBC
HCT: 34.6 % — ABNORMAL LOW (ref 36.0–46.0)
Hemoglobin: 10.7 g/dL — ABNORMAL LOW (ref 12.0–15.0)
MCH: 30.5 pg (ref 26.0–34.0)
MCHC: 30.9 g/dL (ref 30.0–36.0)
MCV: 98.6 fL (ref 80.0–100.0)
Platelets: 309 10*3/uL (ref 150–400)
RBC: 3.51 MIL/uL — ABNORMAL LOW (ref 3.87–5.11)
RDW: 14.1 % (ref 11.5–15.5)
WBC: 8.4 10*3/uL (ref 4.0–10.5)
nRBC: 0 % (ref 0.0–0.2)

## 2020-09-05 LAB — COMPREHENSIVE METABOLIC PANEL
ALT: 18 U/L (ref 0–44)
AST: 23 U/L (ref 15–41)
Albumin: 2.2 g/dL — ABNORMAL LOW (ref 3.5–5.0)
Alkaline Phosphatase: 68 U/L (ref 38–126)
Anion gap: 7 (ref 5–15)
BUN: 17 mg/dL (ref 8–23)
CO2: 33 mmol/L — ABNORMAL HIGH (ref 22–32)
Calcium: 9.3 mg/dL (ref 8.9–10.3)
Chloride: 98 mmol/L (ref 98–111)
Creatinine, Ser: 1.01 mg/dL — ABNORMAL HIGH (ref 0.44–1.00)
GFR, Estimated: 60 mL/min — ABNORMAL LOW (ref >60)
Glucose, Bld: 119 mg/dL — ABNORMAL HIGH (ref 70–99)
Potassium: 4.2 mmol/L (ref 3.5–5.1)
Sodium: 138 mmol/L (ref 135–145)
Total Bilirubin: 0.4 mg/dL (ref 0.3–1.2)
Total Protein: 5.6 g/dL — ABNORMAL LOW (ref 6.5–8.1)

## 2020-09-05 NOTE — Progress Notes (Signed)
Patient ID: Lori Orozco, female   DOB: [birth year], 70 y.o.   MRN: [id number] The patient has a lot of swelling still in her right hand.  There is no swelling in the wrist, but the hand is in a dependent position and has not been elevated any likely this weekend at all.  She is very tearful and in a lot of pain.  I change the dressing on her right wrist incision and it looks good.  I can gently put her wrist through some flexion extension and that aspect of veins is much better.  She has been off antibiotics and her peripheral white blood cell count is normal.  She needs strict elevation all day today and the arm elevator sling by nursing.

## 2020-09-05 NOTE — Progress Notes (Signed)
Lori Orozco PROGRESS NOTE  Lori Orozco DJT:[id number] DOB: [birth year] DOA: 08/28/2020 PCP: Lori Pimple, MD   LOS: 6 days   Brief Narrative / Interim history: 70 year old female with hypothyroidism, hyperlipidemia, HTN here with right wrist pain, erythema and swelling.  Orthopedic surgery saw her as an outpatient, had trialed a Medrol Dosepak without success and she eventually decided to come to the ED.  Orthopedic surgery consulted  Subjective / 24h Interval events: Complains of significant pain and swelling still.  Arm is elevated  Assessment & Plan: Principal Problem Pain and swelling of the right wrist-imaging showed no evidence of bony destruction.  Orthopedic surgery consulted and followed patient while hospitalized.  She underwent an MRI which showed findings concerning for inflammatory arthropathy without direct clear-cut evidence of septic arthritis.  She had a wrist aspirate as an outpatient and that was culture negative.  She was given a trial of steroids which initially helped then pain reoccurred.  Given lack of improvement with steroids she was taken to the OR on 7/7 and she is status post right wrist dorsal compartment extensor tendon tenosynovectomy. -While initially improved on POD 1, now getting worse again -Rule out rheumatological causes with monoarticular involvement, obtain ANA, rheumatoid factor, anti-CCP, still pending this morning.  CRP and sed rate high.  However it should have improved with steroids  Active Problems GAD-continue home medications as below COPD-stable, continue home medications, no wheezing.  She is intermittently dyspneic with activity Hypothyroidism-stable, continue Synthroid Hyperlipidemia-continue statin Sinus tach / essential hypertension-probably due to pain, now started on metoprolol  Scheduled Meds:  buPROPion  150 mg Oral Daily   busPIRone  15 mg Oral BID AC   divalproex  500 mg Oral QHS   estrogens (conjugated)  0.625 mg Oral Daily    levothyroxine  50 mcg Oral QHS   metoprolol tartrate  12.5 mg Oral BID   mirabegron ER  25 mg Oral QHS   mirtazapine  30 mg Oral QHS   pantoprazole  40 mg Oral BID   sertraline  100 mg Oral Daily   simvastatin  20 mg Oral QHS   Continuous Infusions:  sodium chloride 10 mL/hr at 08/31/20 0024   PRN Meds:.sodium chloride, acetaminophen **OR** acetaminophen, albuterol, fluticasone, hydrALAZINE, HYDROmorphone (DILAUDID) injection **OR** HYDROmorphone (DILAUDID) injection, ipratropium-albuterol, ketotifen, LORazepam, ondansetron **OR** ondansetron (ZOFRAN) IV, oxyCODONE, polyethylene glycol  Diet Orders (From admission, onward)     Start     Ordered   09/01/20 2105  Diet regular Room service appropriate? Yes; Fluid consistency: Thin  Diet effective now       Question Answer Comment  Room service appropriate? Yes   Fluid consistency: Thin      09/01/20 2104            DVT prophylaxis: SCDs Start: 08/28/20 2326     Code Status: Full Code  Family Communication: no family at bedside   Status is: Inpatient  Remains inpatient appropriate because:Inpatient level of care appropriate due to severity of illness  Dispo: The patient is from: Home              Anticipated d/c is to: Home              Patient currently is not medically stable to d/c.   Difficult to place patient No  Level of care: Med-Surg  Consultants:  Orthopedic surgery  Procedures:  None   Microbiology  None   Antimicrobials: None   Objective: Vitals:   09/04/20 0815 09/04/20  1206 09/04/20 2122 09/05/20 0747  BP: (!) 166/66 (!) 130/52 (!) 156/59 (!) 171/77  Pulse: (!) 104 77 88 (!) 101  Resp: 17 18 18 18   Temp: 99.1 F (37.3 C) 98.5 F (36.9 C) 98.5 F (36.9 C) 99.2 F (37.3 C)  TempSrc: Oral Oral Oral Oral  SpO2: 95% 90% 93% 91%  Weight:      Height:        Intake/Output Summary (Last 24 hours) at 09/05/2020 1150 Last data filed at 09/05/2020 0600 Gross per 24 hour  Intake 240 ml   Output 1710 ml  Net -1470 ml    Filed Weights   08/29/20 0122  Weight: 88 kg    Examination:  Constitutional: NAD, in bed Eyes: No scleral icterus ENMT: mmm Neck: normal, supple Cardiovascular: Regular rate and rhythm, no edema Abdomen: ND, BS + Musculoskeletal: Right wrist and arm elevator sling Skin: No rashes Neurologic: Nonfocal   Data Reviewed: I have independently reviewed following labs and imaging studies  CBC: Recent Labs  Lab 09/02/20 0334 09/04/20 0405 09/05/20 0250  WBC 8.1 9.1 8.4  HGB 11.0* 9.7* 10.7*  HCT 35.2* 30.6* 34.6*  MCV 98.9 98.1 98.6  PLT 316 323 309    Basic Metabolic Panel: Recent Labs  Lab 09/02/20 0334 09/04/20 0405 09/05/20 0250  NA 137 136 138  K 4.9 3.9 4.2  CL 100 96* 98  CO2 31 35* 33*  GLUCOSE 134* 111* 119*  BUN 24* 21 17  CREATININE 0.85 0.86 1.01*  CALCIUM 9.0 9.1 9.3    Liver Function Tests: Recent Labs  Lab 09/05/20 0250  AST 23  ALT 18  ALKPHOS 68  BILITOT 0.4  PROT 5.6*  ALBUMIN 2.2*    Coagulation Profile: No results for input(s): INR, PROTIME in the last 168 hours.  HbA1C: No results for input(s): HGBA1C in the last 72 hours.  CBG: No results for input(s): GLUCAP in the last 168 hours.  Recent Results (from the past 240 hour(s))  Culture, blood (routine x 2)     Status: None   Collection Time: 08/29/20  1:10 AM   Specimen: BLOOD  Result Value Ref Range Status   Specimen Description BLOOD RIGHT ANTECUBITAL  Final   Special Requests   Final    BOTTLES DRAWN AEROBIC AND ANAEROBIC Blood Culture results may not be optimal due to an excessive volume of blood received in culture bottles   Culture   Final    NO GROWTH 5 DAYS Performed at [facility], [address], [place], [place] [address]    Report Status 09/03/2020 FINAL  Final  Culture, blood (routine x 2)     Status: None   Collection Time: 08/29/20  1:38 AM   Specimen: BLOOD LEFT HAND  Result Value Ref Range Status   Specimen  Description BLOOD LEFT HAND  Final   Special Requests   Final    BOTTLES DRAWN AEROBIC AND ANAEROBIC Blood Culture results may not be optimal due to an excessive volume of blood received in culture bottles   Culture   Final    NO GROWTH 5 DAYS Performed at [facility], [address] [place], [place] [address]    Report Status 09/03/2020 FINAL  Final  Culture, Urine     Status: None   Collection Time: 08/29/20  4:27 AM   Specimen: Urine, Clean Catch  Result Value Ref Range Status   Specimen Description URINE, CLEAN CATCH  Final  Special Requests NONE  Final   Culture   Final    NO GROWTH Performed at [facility], [address], [place], [place] [address]    Report Status 08/30/2020 FINAL  Final  Resp Panel by RT-PCR (Flu A&B, Covid) Nasopharyngeal Swab     Status: None   Collection Time: 08/29/20  5:55 AM   Specimen: Nasopharyngeal Swab; Nasopharyngeal(NP) swabs in vial transport medium  Result Value Ref Range Status   SARS Coronavirus 2 by RT PCR NEGATIVE NEGATIVE Final    Comment: (NOTE) SARS-CoV-2 target nucleic acids are NOT DETECTED.  The SARS-CoV-2 RNA is generally detectable in upper respiratory specimens during the acute phase of infection. The lowest concentration of SARS-CoV-2 viral copies this assay can detect is 138 copies/mL. A negative result does not preclude SARS-Cov-2 infection and should not be used as the sole basis for treatment or other patient management decisions. A negative result may occur with  improper specimen collection/handling, submission of specimen other than nasopharyngeal swab, presence of viral mutation(s) within the areas targeted by this assay, and inadequate number of viral copies(<138 copies/mL). A negative result must be combined with clinical observations, patient history, and epidemiological information. The expected result is Negative.  Fact Sheet for Patients:  BloggerCourse.com  Fact  Sheet for Healthcare Providers:  SeriousBroker.it  This test is no t yet approved or cleared by the [place] FDA and  has been authorized for detection and/or diagnosis of SARS-CoV-2 by FDA under an Emergency Use Authorization (EUA). This EUA will remain  in effect (meaning this test can be used) for the duration of the COVID-19 declaration under Section 564(b)(1) of the Act, 21 U.S.C.section 360bbb-3(b)(1), unless the authorization is terminated  or revoked sooner.       Influenza A by PCR NEGATIVE NEGATIVE Final   Influenza B by PCR NEGATIVE NEGATIVE Final    Comment: (NOTE) The Xpert Xpress SARS-CoV-2/FLU/RSV plus assay is intended as an aid in the diagnosis of influenza from Nasopharyngeal swab specimens and should not be used as a sole basis for treatment. Nasal washings and aspirates are unacceptable for Xpert Xpress SARS-CoV-2/FLU/RSV testing.  Fact Sheet for Patients: BloggerCourse.com  Fact Sheet for Healthcare Providers: SeriousBroker.it  This test is not yet approved or cleared by the [place] FDA and has been authorized for detection and/or diagnosis of SARS-CoV-2 by FDA under an Emergency Use Authorization (EUA). This EUA will remain in effect (meaning this test can be used) for the duration of the COVID-19 declaration under Section 564(b)(1) of the Act, 21 U.S.C. section 360bbb-3(b)(1), unless the authorization is terminated or revoked.  Performed at [facility], [address] [place], [place] [address]   Surgical pcr screen     Status: None   Collection Time: 09/01/20 12:03 PM   Specimen: Nasal Mucosa; Nasal Swab  Result Value Ref Range Status   MRSA, PCR NEGATIVE NEGATIVE Final   Staphylococcus aureus NEGATIVE NEGATIVE Final    Comment: (NOTE) The Xpert SA Assay (FDA approved for NASAL specimens in patients 8 years of age and older), is one component of a  comprehensive surveillance program. It is not intended to diagnose infection nor to guide or monitor treatment. Performed at [facility], [address], [place], [place] [address]       Radiology Studies: No results found.   Pamella Pert, MD, PhD [facility]  Between 7 am - 7 pm I am available, please contact me via Amion (for  emergencies) or Securechat (non urgent messages)  Between 7 pm - 7 am I am not available, please contact night coverage MD/APP via Amion

## 2020-09-05 NOTE — Progress Notes (Signed)
Physical Therapy Treatment Patient Details Name: Lori Orozco MRN: [id number] DOB: [birth year] Today's Date: 09/05/2020    History of Present Illness [name] admitted with severe Rt wrist pain.  MRI showed findings concerning for inflammatory arthropathy without direct clear-cut evidence of septic arthritis.  She underwent Rt tynosynovectomy on 7/8.  PMH includes: amaurosis Fugax, anxiety, chronic fatigue syndrome, CKD, COPD, COVID, Emphysema, Fibromyalgia, IBS, Linchen sclerosus, Migraines, vertigo, dexa - osteopenia, s/p bil. TKA, s/p Lt TSA    PT Comments    Pt was seen for attempt at gait but is too painful to realyy move well.  Her R hand and wrist are red, edematous and warm to touch.  Hypersensitivity is an issue, so was unable to move well and have a good way to support standing with L shoulder instability.  Follow acutely and work with medical staff to help manage the hand and wrist to allow her to make mobility progress.  Focus on the ability to transfer and to move with minimal to no use of UE's.  Follow Up Recommendations  SNF     Equipment Recommendations  None recommended by PT    Recommendations for Other Services       Precautions / Restrictions Precautions Precautions: Fall Required Braces or Orthoses: Other Brace Other Brace: R arm elevator Restrictions Weight Bearing Restrictions: No    Mobility  Bed Mobility Overal bed mobility: Needs Assistance Bed Mobility: Supine to Sit;Sit to Supine     Supine to sit: Min assist;HOB elevated;Mod assist Sit to supine: Min assist;Mod assist   General bed mobility comments: assist to support RUE and shift to edge of bed    Transfers Overall transfer level: Needs assistance Equipment used: 1 person hand held assist Transfers: Sit to/from Stand Sit to Stand: Mod assist         General transfer comment: pt was unable to stand long enough to ck standing HR  Ambulation/Gait              General Gait Details: deferred due to pain   Stairs             Wheelchair Mobility    Modified Rankin (Stroke Patients Only)       Balance Overall balance assessment: Needs assistance Sitting-balance support: Feet supported Sitting balance-Leahy Scale: Fair     Standing balance support: Single extremity supported Standing balance-Leahy Scale: Poor                              Cognition Arousal/Alertness: Awake/alert Behavior During Therapy: Anxious Overall Cognitive Status: Within Functional Limits for tasks assessed                                        Exercises      General Comments General comments (skin integrity, edema, etc.): pt is unable to tolerate much movement due to severe RUE pain      Pertinent Vitals/Pain Pain Assessment: No/denies pain Faces Pain Scale: Hurts whole lot Pain Location: Rt hand and wrist Pain Descriptors / Indicators: Grimacing;Guarding;Sharp Pain Intervention(s): Monitored during session;Repositioned;Premedicated before session    Home Living                      Prior Function            PT Goals (current  goals can now be found in the care plan section) Acute Rehab PT Goals Patient Stated Goal: to be able to get back home Progress towards PT goals: Not progressing toward goals - comment    Frequency    Min 3X/week      PT Plan Discharge plan needs to be updated    Co-evaluation              AM-PAC PT "6 Clicks" Mobility   Outcome Measure  Help needed turning from your back to your side while in a flat bed without using bedrails?: A Little Help needed moving from lying on your back to sitting on the side of a flat bed without using bedrails?: A Lot Help needed moving to and from a bed to a chair (including a wheelchair)?: A Lot Help needed standing up from a chair using your arms (e.g., wheelchair or bedside chair)?: A Lot Help needed to walk in hospital room?:  Total Help needed climbing 3-5 steps with a railing? : Total 6 Click Score: 11    End of Session   Activity Tolerance: Patient limited by pain Patient left: in bed;with call bell/phone within reach;with bed alarm set Nurse Communication: Mobility status PT Visit Diagnosis: Unsteadiness on feet (R26.81);Muscle weakness (generalized) (M62.81);Difficulty in walking, not elsewhere classified (R26.2);Pain     Time: 9798-9211 PT Time Calculation (min) (ACUTE ONLY): 31 min  Charges:  $Therapeutic Activity: 23-37 mins                  Ivar Drape 09/05/2020, 8:25 PM   Samul Dada, PT MS Acute Rehab Dept. Number: [facility] [id number] and [facility] [phone]

## 2020-09-05 NOTE — Progress Notes (Signed)
Occupational Therapy Treatment Patient Details Name: Lori Orozco MRN: [id number] DOB: [birth year] Today's Date: 09/05/2020    History of present illness [name] admitted with severe Rt wrist pain.  MRI showed findings concerning for inflammatory arthropathy without direct clear-cut evidence of septic arthritis.  She underwent Rt tynosynovectomy on 7/8.  PMH includes: amaurosis Fugax, anxiety, chronic fatigue syndrome, CKD, COPD, COVID, Emphysema, Fibromyalgia, IBS, Linchen sclerosus, Migraines, vertigo, dexa - osteopenia, s/p bil. TKA, s/p Lt TSA   OT comments  Focus of session on R hand/UE A/AA/PROM. Pt fearful of any movement. Does best with self ROM/AAROM. Small gains of @ 20 degrees made with IP/MP ROM in flexion. Initiated retrograde massage. Pt educated on need to perform massage hourly with lotion to address desensitization and to keep R hand elevated. Wash cloth left in hand to encourage AROM. Attempting to locate coban to begin compression wrapping. Will continue to follow.    Follow Up Recommendations  Outpatient OT;Supervision/Assistance - 24 hour    Equipment Recommendations  None recommended by OT    Recommendations for Other Services PT consult    Precautions / Restrictions Precautions Precautions: Fall Required Braces or Orthoses: Other Brace Other Brace: R arm elevator       Mobility Bed Mobility                    Transfers                      Balance                                           ADL either performed or assessed with clinical judgement   ADL                                               Vision       Perception     Praxis      Cognition Arousal/Alertness: Awake/alert Behavior During Therapy: Anxious Overall Cognitive Status: Within Functional Limits for tasks assessed                                          Exercises Exercises: Hand  exercises Hand Exercises Forearm Supination: AAROM;Right;10 reps;Supine Forearm Pronation: AROM;Right;10 reps;Supine Wrist Flexion: PROM;AAROM;Right;10 reps;Supine Wrist Extension: PROM;AAROM;Right;10 reps;Supine Digit Composite Flexion: AAROM;Right;10 reps;Supine;Other (comment) (bath cloth) Opposition: AAROM;Right;5 reps;Supine Other Exercises Other Exercises: composite extension x 10 - AAROM Other Exercises: tumb circels AAROM Other Exercises: retrograde massage as tolerated   Shoulder Instructions       General Comments      Pertinent Vitals/ Pain       Pain Assessment: Faces Faces Pain Scale: Hurts whole lot Pain Location: Rt hand and wrist Pain Descriptors / Indicators: Aching;Burning;Sharp;Shooting;Stabbing;Constant Pain Intervention(s): Limited activity within patient's tolerance;Repositioned  Home Living                                          Prior Functioning/Environment  Frequency  Min 3X/week        Progress Toward Goals  OT Goals(current goals can now be found in the care plan section)  Progress towards OT goals: Progressing toward goals  Acute Rehab OT Goals Patient Stated Goal: to be able to bowl again OT Goal Formulation: With patient Time For Goal Achievement: 09/17/20 Potential to Achieve Goals: Good  Plan Discharge plan remains appropriate    Co-evaluation                 AM-PAC OT "6 Clicks" Daily Activity     Outcome Measure   Help from another person eating meals?: A Little Help from another person taking care of personal grooming?: A Little Help from another person toileting, which includes using toliet, bedpan, or urinal?: A Lot Help from another person bathing (including washing, rinsing, drying)?: A Lot Help from another person to put on and taking off regular upper body clothing?: A Lot Help from another person to put on and taking off regular lower body clothing?: A Lot 6 Click Score:  14    End of Session    OT Visit Diagnosis: Pain;Muscle weakness (generalized) (M62.81) Pain - Right/Left: Right Pain - part of body: Hand;Arm   Activity Tolerance Patient limited by pain   Patient Left in bed;with call bell/phone within reach   Nurse Communication Other (comment) (positioning RUE)        Time: 8341-9622 OT Time Calculation (min): 20 min  Charges: OT General Charges $OT Visit: 1 Visit OT Treatments $Therapeutic Exercise: 8-22 mins  Luisa Dago, OT/L   Acute OT Clinical Specialist Acute Rehabilitation Services Pager [phone] Office [phone]    [facility] 09/05/2020, 9:23 AM

## 2020-09-06 DIAGNOSIS — M25531 Pain in right wrist: Secondary | ICD-10-CM | POA: Diagnosis not present

## 2020-09-06 DIAGNOSIS — M25431 Effusion, right wrist: Secondary | ICD-10-CM | POA: Diagnosis not present

## 2020-09-06 LAB — CBC
HCT: 32 % — ABNORMAL LOW (ref 36.0–46.0)
Hemoglobin: 10.2 g/dL — ABNORMAL LOW (ref 12.0–15.0)
MCH: 31 pg (ref 26.0–34.0)
MCHC: 31.9 g/dL (ref 30.0–36.0)
MCV: 97.3 fL (ref 80.0–100.0)
Platelets: 333 10*3/uL (ref 150–400)
RBC: 3.29 MIL/uL — ABNORMAL LOW (ref 3.87–5.11)
RDW: 14 % (ref 11.5–15.5)
WBC: 8.5 10*3/uL (ref 4.0–10.5)
nRBC: 0 % (ref 0.0–0.2)

## 2020-09-06 LAB — BASIC METABOLIC PANEL
Anion gap: 7 (ref 5–15)
BUN: 17 mg/dL (ref 8–23)
CO2: 33 mmol/L — ABNORMAL HIGH (ref 22–32)
Calcium: 9.4 mg/dL (ref 8.9–10.3)
Chloride: 96 mmol/L — ABNORMAL LOW (ref 98–111)
Creatinine, Ser: 0.92 mg/dL (ref 0.44–1.00)
GFR, Estimated: >60 mL/min (ref >60)
Glucose, Bld: 116 mg/dL — ABNORMAL HIGH (ref 70–99)
Potassium: 3.9 mmol/L (ref 3.5–5.1)
Sodium: 136 mmol/L (ref 135–145)

## 2020-09-06 LAB — ANA W/REFLEX IF POSITIVE: Anti Nuclear Antibody (ANA): NEGATIVE

## 2020-09-06 LAB — RHEUMATOID FACTOR: Rheumatoid fact SerPl-aCnc: 14.2 IU/mL — ABNORMAL HIGH (ref <14.0)

## 2020-09-06 MED ORDER — METHYLPREDNISOLONE SODIUM SUCC 125 MG IJ SOLR
60.0000 mg | Freq: Once | INTRAMUSCULAR | Status: AC
  Administered 2020-09-06: 60 mg via INTRAVENOUS
  Filled 2020-09-06: qty 0.96

## 2020-09-06 MED ORDER — DICLOFENAC SODIUM 1 % EX GEL
2.0000 g | Freq: Two times a day (BID) | CUTANEOUS | Status: DC | PRN
  Administered 2020-09-07: 2 g via TOPICAL
  Filled 2020-09-06: qty 100

## 2020-09-06 MED ORDER — KETOROLAC TROMETHAMINE 15 MG/ML IJ SOLN
15.0000 mg | Freq: Once | INTRAMUSCULAR | Status: AC
  Administered 2020-09-06: 15 mg via INTRAVENOUS
  Filled 2020-09-06: qty 1

## 2020-09-06 NOTE — Progress Notes (Signed)
Patient ID: Lori Orozco, female   DOB: 07-09-1950, 70 y.o.   MRN: [id number] The patient continues to have right hand swelling and pain as well as wrist pain.  The incision continues to look good.  The hand is much less swollen today than it was yesterday but it is swollen this morning given the fact that she has not elevated overnight.  I reiterated the importance of keeping the hand and wrist elevated in order to get the swelling to decrease.  From an orthopedic standpoint, there is not much else that I have to offer other than time, anti-inflammatories, and oral antibiotics.

## 2020-09-06 NOTE — Progress Notes (Signed)
Occupational Therapy Treatment Note  Returned to reassess pt after several hours in coban compression wrap. Pt with reduced edema dn states the wrap makes her hand feel better. Improved ROM with notable increase of MP and IP flexion with decreased sensitivity and complaints of pain. Hand/forearm rewrapped and elevated in arm elevator sling. Discussed importance tof using the [facility] and spending time OOB in chair and being OOB to chair for all meals. Pt verbalized understanding. Will continue to follow.     09/06/20 1432  OT Visit Information  Last OT Received On 09/06/20  Assistance Needed +1  History of Present Illness [name] admitted with severe Rt wrist pain.  MRI showed findings concerning for inflammatory arthropathy without direct clear-cut evidence of septic arthritis.  She underwent Rt tynosynovectomy on 7/8.  PMH includes: amaurosis Fugax, anxiety, chronic fatigue syndrome, CKD, COPD, COVID, Emphysema, Fibromyalgia, IBS, Linchen sclerosus, Migraines, vertigo, dexa - osteopenia, s/p bil. TKA, s/p Lt TSA  Precautions  Precautions Fall  Required Braces or Orthoses Other Brace  Other Brace R arm elevator (coban wrap)  Pain Assessment  Pain Assessment Faces  Faces Pain Scale 6  Pain Location Rt hand and wrist  Pain Descriptors / Indicators Grimacing;Guarding;Sharp  Pain Intervention(s) Limited activity within patient's tolerance  Cognition  Arousal/Alertness Awake/alert  Behavior During Therapy Anxious  Overall Cognitive Status Within Functional Limits for tasks assessed  General Comments  General comments (skin integrity, edema, etc.) Coban removed. A/AAROM with increased ROM of MPs and IPs; tolerated retrodrgade massage and gentle pasive stretch  Hand Exercises  Thumb Abduction PROM;AAROM;Right;10 reps;Seated  Thumb Adduction AROM;AAROM;Right;10 reps  Digit Composite Flexion AAROM;AROM;Right;10 reps;Seated  Composite Extension AROM;AAROM;Right;10 reps;Seated;Squeeze  ball  Opposition AAROM;Right;AROM;10 reps;Seated  Other Exercises  Other Exercises thumb circles AAROM  Other Exercises rewrapped with Coban to provide compression; forearm included  OT - End of Session  Activity Tolerance Patient tolerated treatment well  Patient left in bed;with call bell/phone within reach;with bed alarm set  Nurse Communication Mobility status;Other (comment) (RUE compression wrap adn elevation)  OT Assessment/Plan  OT Plan Discharge plan remains appropriate;Other (comment)  OT Visit Diagnosis Pain;Muscle weakness (generalized) (M62.81);Unsteadiness on feet (R26.81)  Pain - Right/Left Right  Pain - part of body Hand;Arm  OT Frequency (ACUTE ONLY) Min 3X/week  Recommendations for Other Services PT consult  Follow Up Recommendations Outpatient OT;Supervision/Assistance - 24 hour;SNF (pending progress)  OT Equipment None recommended by OT  AM-PAC OT "6 Clicks" Daily Activity Outcome Measure (Version 2)  Help from another person eating meals? 3  Help from another person taking care of personal grooming? 3  Help from another person toileting, which includes using toliet, bedpan, or urinal? 2  Help from another person bathing (including washing, rinsing, drying)? 2  Help from another person to put on and taking off regular upper body clothing? 2  Help from another person to put on and taking off regular lower body clothing? 2  6 Click Score 14  OT Goal Progression  Progress towards OT goals Progressing toward goals  Acute Rehab OT Goals  Patient Stated Goal to be able to get back home  OT Goal Formulation With patient  Time For Goal Achievement 09/17/20  Potential to Achieve Goals Good  ADL Goals  Pt Will Perform Upper Body Bathing with set-up;sitting  Pt Will Perform Upper Body Dressing with set-up;sitting  Pt/caregiver will Perform Home Exercise Program Right Upper extremity;With written HEP provided;Increased ROM;Independently  Additional ADL Goal #1 Pt will  independently  verbalize 3 strategies to reduce edema  OT Time Calculation  OT Start Time (ACUTE ONLY) 1259  OT Stop Time (ACUTE ONLY) 1336  OT Time Calculation (min) 37 min  OT General Charges  $OT Visit 1 Visit  OT Treatments  $Therapeutic Activity 8-22 mins  $Therapeutic Exercise 8-22 mins  Luisa Dago, OT/L   Acute OT Clinical Specialist Acute Rehabilitation Services Pager [phone] Office [phone]

## 2020-09-06 NOTE — Progress Notes (Signed)
Lori PROGRESS NOTE  Lori Orozco PFX:[id number] DOB: October 18, 1950 DOA: 08/28/2020 PCP: [name], MD   LOS: 7 days   Brief Narrative / Interim history: 70 year old female with hypothyroidism, hyperlipidemia, HTN here with right wrist pain, erythema and swelling.  Orthopedic surgery saw her as an outpatient, had trialed a Medrol Dosepak without success and she eventually decided to come to the ED.  Orthopedic surgery consulted and following.  Subjective / 24h Interval events: Still with significant wrist pain this morning as well as swelling.  Assessment & Plan: Principal Problem Pain and swelling of the right wrist-imaging showed no evidence of bony destruction.  Orthopedic surgery consulted and following.  She underwent an MRI which showed findings concerning for inflammatory arthropathy without direct clear-cut evidence of septic arthritis.  She had a wrist aspirate as an outpatient and that was culture negative.  She was given a trial of steroids which initially helped then pain and swelling reoccurred.  Given lack of improvement with steroids she was taken to the OR on 7/7 and she is status post right wrist dorsal compartment extensor tendon tenosynovectomy. -While initially improved on POD 1, now getting worse again -Rule out rheumatological causes, obtain ANA, rheumatoid factor, anti-CCP, still pending this morning.  CRP and sed rate high.  Systemic rheumatological entities are somewhat rare with unilateral wrist involvement, and plus steroids should help.   -Discussed with Dr. [name] again this morning, will repeat IV steroids today -She has become weak at this point that PT recommends SNF, patient agreeable and social worker consulted today  Active Problems GAD-continue home medications, Wellbutrin, BuSpar, Depakote, Zoloft COPD-stable, no wheezing today, continue as needed nebulizers Hypothyroidism-stable, continue Synthroid Hyperlipidemia-continue statin Sinus tach /  essential hypertension-likely worsened by presence of pain, she was started on beta-blockers with improvement in her blood pressure as well as heart rate  Scheduled Meds:  buPROPion  150 mg Oral Daily   busPIRone  15 mg Oral BID AC   divalproex  500 mg Oral QHS   estrogens (conjugated)  0.625 mg Oral Daily   levothyroxine  50 mcg Oral QHS   metoprolol tartrate  12.5 mg Oral BID   mirabegron ER  25 mg Oral QHS   mirtazapine  30 mg Oral QHS   pantoprazole  40 mg Oral BID   sertraline  100 mg Oral Daily   simvastatin  20 mg Oral QHS   Continuous Infusions:  sodium chloride 10 mL/hr at 08/31/20 0024   PRN Meds:.sodium chloride, acetaminophen **OR** acetaminophen, albuterol, fluticasone, hydrALAZINE, HYDROmorphone (DILAUDID) injection **OR** HYDROmorphone (DILAUDID) injection, ipratropium-albuterol, ketotifen, LORazepam, ondansetron **OR** ondansetron (ZOFRAN) IV, oxyCODONE, polyethylene glycol  Diet Orders (From admission, onward)     Start     Ordered   09/01/20 2105  Diet regular Room service appropriate? Yes; Fluid consistency: Thin  Diet effective now       Question Answer Comment  Room service appropriate? Yes   Fluid consistency: Thin      09/01/20 2104            DVT prophylaxis: SCDs Start: 08/28/20 2326     Code Status: Full Code  Family Communication: no family at bedside   Status is: Inpatient  Remains inpatient appropriate because:Inpatient level of care appropriate due to severity of illness  Dispo: The patient is from: Home              Anticipated d/c is to: SNF  Patient currently is not medically stable to d/c.   Difficult to place patient No  Level of care: Med-Surg  Consultants:  Orthopedic surgery  Procedures:  None   Microbiology  None   Antimicrobials: None   Objective: Vitals:   09/05/20 1533 09/05/20 1534 09/05/20 2114 09/06/20 0807  BP:   (!) 150/67 140/65  Pulse:   92 83  Resp:   15 18  Temp:   98.4 F (36.9 C)  98.2 F (36.8 C)  TempSrc:   Oral Oral  SpO2: (!) 88% 93% (!) 87% 94%  Weight:      Height:        Intake/Output Summary (Last 24 hours) at 09/06/2020 1058 Last data filed at 09/06/2020 0809 Gross per 24 hour  Intake 480 ml  Output 1600 ml  Net -1120 ml    Filed Weights   08/29/20 0122  Weight: 88 kg    Examination:  Constitutional: She is in no distress, in bed Eyes: No scleral icterus ENMT: mmm Neck: normal, supple Cardiovascular: Regular rate and rhythm, no peripheral edema Abdomen: Abdomen soft, nontender, nondistended, bowel sounds positive Musculoskeletal: Right wrist is swollen, no significant erythema.  Tender to palpation throughout. Skin: No rash appreciated Neurologic: No focal deficits   Data Reviewed: I have independently reviewed following labs and imaging studies  CBC: Recent Labs  Lab 09/02/20 0334 09/04/20 0405 09/05/20 0250 09/06/20 0246  WBC 8.1 9.1 8.4 8.5  HGB 11.0* 9.7* 10.7* 10.2*  HCT 35.2* 30.6* 34.6* 32.0*  MCV 98.9 98.1 98.6 97.3  PLT 316 323 309 333    Basic Metabolic Panel: Recent Labs  Lab 09/02/20 0334 09/04/20 0405 09/05/20 0250 09/06/20 0246  NA 137 136 138 136  K 4.9 3.9 4.2 3.9  CL 100 96* 98 96*  CO2 31 35* 33* 33*  GLUCOSE 134* 111* 119* 116*  BUN 24* 21 17 17   CREATININE 0.85 0.86 1.01* 0.92  CALCIUM 9.0 9.1 9.3 9.4    Liver Function Tests: Recent Labs  Lab 09/05/20 0250  AST 23  ALT 18  ALKPHOS 68  BILITOT 0.4  PROT 5.6*  ALBUMIN 2.2*    Coagulation Profile: No results for input(s): INR, PROTIME in the last 168 hours.  HbA1C: No results for input(s): HGBA1C in the last 72 hours.  CBG: No results for input(s): GLUCAP in the last 168 hours.  Recent Results (from the past 240 hour(s))  Culture, blood (routine x 2)     Status: None   Collection Time: 08/29/20  1:10 AM   Specimen: BLOOD  Result Value Ref Range Status   Specimen Description BLOOD RIGHT ANTECUBITAL  Final   Special Requests    Final    BOTTLES DRAWN AEROBIC AND ANAEROBIC Blood Culture results may not be optimal due to an excessive volume of blood received in culture bottles   Culture   Final    NO GROWTH 5 DAYS Performed at [facility], [address], [place], [place] [address]    Report Status 09/03/2020 FINAL  Final  Culture, blood (routine x 2)     Status: None   Collection Time: 08/29/20  1:38 AM   Specimen: BLOOD LEFT HAND  Result Value Ref Range Status   Specimen Description BLOOD LEFT HAND  Final   Special Requests   Final    BOTTLES DRAWN AEROBIC AND ANAEROBIC Blood Culture results may not be optimal due to an excessive volume of blood received in culture bottles  Culture   Final    NO GROWTH 5 DAYS Performed at [facility], [address], [place], [place] [address]    Report Status 09/03/2020 FINAL  Final  Culture, Urine     Status: None   Collection Time: 08/29/20  4:27 AM   Specimen: Urine, Clean Catch  Result Value Ref Range Status   Specimen Description URINE, CLEAN CATCH  Final   Special Requests NONE  Final   Culture   Final    NO GROWTH Performed at [facility], [address], [place], [place] [address]    Report Status 08/30/2020 FINAL  Final  Resp Panel by RT-PCR (Flu A&B, Covid) Nasopharyngeal Swab     Status: None   Collection Time: 08/29/20  5:55 AM   Specimen: Nasopharyngeal Swab; Nasopharyngeal(NP) swabs in vial transport medium  Result Value Ref Range Status   SARS Coronavirus 2 by RT PCR NEGATIVE NEGATIVE Final    Comment: (NOTE) SARS-CoV-2 target nucleic acids are NOT DETECTED.  The SARS-CoV-2 RNA is generally detectable in upper respiratory specimens during the acute phase of infection. The lowest concentration of SARS-CoV-2 viral copies this assay can detect is 138 copies/mL. A negative result does not preclude SARS-Cov-2 infection and should not be used as the sole basis for treatment or other patient management decisions. A negative result  may occur with  improper specimen collection/handling, submission of specimen other than nasopharyngeal swab, presence of viral mutation(s) within the areas targeted by this assay, and inadequate number of viral copies(<138 copies/mL). A negative result must be combined with clinical observations, patient history, and epidemiological information. The expected result is Negative.  Fact Sheet for Patients:  BloggerCourse.com  Fact Sheet for Healthcare Providers:  SeriousBroker.it  This test is no t yet approved or cleared by the [place] FDA and  has been authorized for detection and/or diagnosis of SARS-CoV-2 by FDA under an Emergency Use Authorization (EUA). This EUA will remain  in effect (meaning this test can be used) for the duration of the COVID-19 declaration under Section 564(b)(1) of the Act, 21 U.S.C.section 360bbb-3(b)(1), unless the authorization is terminated  or revoked sooner.       Influenza A by PCR NEGATIVE NEGATIVE Final   Influenza B by PCR NEGATIVE NEGATIVE Final    Comment: (NOTE) The Xpert Xpress SARS-CoV-2/FLU/RSV plus assay is intended as an aid in the diagnosis of influenza from Nasopharyngeal swab specimens and should not be used as a sole basis for treatment. Nasal washings and aspirates are unacceptable for Xpert Xpress SARS-CoV-2/FLU/RSV testing.  Fact Sheet for Patients: BloggerCourse.com  Fact Sheet for Healthcare Providers: SeriousBroker.it  This test is not yet approved or cleared by the [place] FDA and has been authorized for detection and/or diagnosis of SARS-CoV-2 by FDA under an Emergency Use Authorization (EUA). This EUA will remain in effect (meaning this test can be used) for the duration of the COVID-19 declaration under Section 564(b)(1) of the Act, 21 U.S.C. section 360bbb-3(b)(1), unless the authorization is terminated  or revoked.  Performed at [facility], [address], [place], [place] [address]   Surgical pcr screen     Status: None   Collection Time: 09/01/20 12:03 PM   Specimen: Nasal Mucosa; Nasal Swab  Result Value Ref Range Status   MRSA, PCR NEGATIVE NEGATIVE Final   Staphylococcus aureus NEGATIVE NEGATIVE Final    Comment: (NOTE) The Xpert SA Assay (FDA approved for NASAL specimens in patients 22 years  of age and older), is one component of a comprehensive surveillance program. It is not intended to diagnose infection nor to guide or monitor treatment. Performed at [facility], [address] [place], [place] [address]       Radiology Studies: No results found.   Pamella Pert, MD, PhD [facility]  Between 7 am - 7 pm I am available, please contact me via Amion (for emergencies) or Securechat (non urgent messages)  Between 7 pm - 7 am I am not available, please contact night coverage MD/APP via Amion

## 2020-09-06 NOTE — Progress Notes (Signed)
Occupational Therapy Treatment Note  Completed BSC to bed transfer with min A. Pt able to hold toilet paper roll with R hand to assist with pericare. SpO2 desat to 85 with activity with 2/4 DOE noted. Returns to 91 with pursed lip breathing on RA. Pt with hx of COPD and Covid and states she has experienced increased SOB since having Covid a year ago. Encouraged use of incentive spirometer. Will follow up tomorrow.     09/06/20 1432 09/06/20 1502  OT Visit Information  Last OT Received On  --  09/06/20  Assistance Needed  --  +1  History of Present Illness  --  [name] admitted with severe Rt wrist pain.  MRI showed findings concerning for inflammatory arthropathy without direct clear-cut evidence of septic arthritis.  She underwent Rt tynosynovectomy on 7/8.  PMH includes: amaurosis Fugax, anxiety, chronic fatigue syndrome, CKD, COPD, COVID, Emphysema, Fibromyalgia, IBS, Linchen sclerosus, Migraines, vertigo, dexa - osteopenia, s/p bil. TKA, s/p Lt TSA  Precautions  Precautions  --  Fall  Required Braces or Orthoses  --  Other Brace  Other Brace  --  R arm elevator (coban RUE)  Pain Assessment  Pain Assessment  --  Faces  Faces Pain Scale  --  4  Pain Location  --  Rt hand and wrist  Pain Descriptors / Indicators  --  Grimacing;Guarding;Sharp  Pain Intervention(s)  --  Limited activity within patient's tolerance  Cognition  Arousal/Alertness  --  Awake/alert  Behavior During Therapy  --  WFL for tasks assessed/performed  Overall Cognitive Status  --  Within Functional Limits for tasks assessed  ADL  Toilet Transfer  --  Minimal assistance;Stand-pivot  Toileting- Clothing Manipulation and Hygiene  --  Set up;Sitting/lateral lean  Functional mobility during ADLs  --  Minimal assistance;Cueing for safety;Cueing for sequencing  General ADL Comments  --  stnad pivot back to bed using bed rail then BSC to hold. would most likely benefit from using platform RW. able to hold toilet  paper roll with R hand to help with hygiene after toileting  Bed Mobility  Overal bed mobility  --  Needs Assistance  Sit to supine  --  Supervision  General bed mobility comments  --  Pt able to reposition herself back into bed from toilet with S  Balance  Sitting balance-Leahy Scale  --  Fair  Standing balance support  --  Single extremity supported  Standing balance-Leahy Scale  --  Poor  Transfers  Overall transfer level  --  Needs assistance  Equipment used  --  1 person hand held assist  Transfers  --  Sit to/from Stand  Sit to Stand  --  Min assist  Stand pivot transfers  --  Min assist  General Comments  General comments (skin integrity, edema, etc.)  --  Room mate present and educated on need to encourage pt to complete frequent ROM/squeeze ball exercises and keep RUE elevated  Other Exercises  Other Exercises  --  incentive spirometer  OT - End of Session  Activity Tolerance  --  Patient tolerated treatment well  Patient left  --  in bed;with call bell/phone within reach;with family/visitor present  Nurse Communication  --  Mobility status;Other (comment) (use of elevation/compression for RUE)  OT Assessment/Plan  OT Plan  --  Discharge plan remains appropriate;Other (comment)  OT Visit Diagnosis  --  Pain;Muscle weakness (generalized) (M62.81);Unsteadiness on feet (R26.81)  Pain - Right/Left  --  Right  Pain - part of body  --  Hand;Arm  OT Frequency (ACUTE ONLY)  --  Min 3X/week  Recommendations for Other Services  --  PT consult  Follow Up Recommendations  --  Outpatient OT;Supervision/Assistance - 24 hour;SNF (pending progress)  OT Equipment  --  3 in 1 bedside commode  AM-PAC OT "6 Clicks" Daily Activity Outcome Measure (Version 2)  Help from another person eating meals? 3 3  Help from another person taking care of personal grooming? 3 3  Help from another person toileting, which includes using toliet, bedpan, or urinal? 2 3  Help from another person bathing  (including washing, rinsing, drying)? 2 2  Help from another person to put on and taking off regular upper body clothing? 2 2  Help from another person to put on and taking off regular lower body clothing? 2 2  6  Click Score 14 15  OT Goal Progression  Progress towards OT goals  --  Progressing toward goals  Acute Rehab OT Goals  Patient Stated Goal  --  to be able to get back home  OT Goal Formulation  --  With patient  Time For Goal Achievement  --  09/17/20  Potential to Achieve Goals  --  Good  ADL Goals  Pt Will Perform Upper Body Bathing  --  with set-up;sitting  Pt Will Perform Upper Body Dressing  --  with set-up;sitting  Pt/caregiver will Perform Home Exercise Program  --  Right Upper extremity;With written HEP provided;Increased ROM;Independently  Additional ADL Goal #1  --  Pt will independently verbalize 3 strategies to reduce edema  OT Time Calculation  OT Start Time (ACUTE ONLY) 1259 1436  OT Stop Time (ACUTE ONLY) 1336 1454  OT Time Calculation (min) 37 min 18 min  OT General Charges  $OT Visit  --  1 Visit  OT Treatments  $Self Care/Home Management   --  8-22 mins  09/19/20, OT/L   Acute OT Clinical Specialist Acute Rehabilitation Services Pager [phone] Office [phone]

## 2020-09-06 NOTE — TOC Initial Note (Signed)
Transition of Care [facility]) - Initial/Assessment Note    Patient Details  Name: Lori Orozco MRN: [id number] Date of Birth: 09/01/1950  Transition of Care [facility]) CM/SW Contact:    Ralene Bathe, LCSWA Phone Number: 09/06/2020, 2:45 PM  Clinical Narrative:                 CSW received consult for possible SNF placement at time of discharge. CSW spoke with patient. Patient reported that if rehab is what is needed that patient is willing to go, but is hopeful to "walk out of here".  Patient expressed understanding of PT recommendation and is agreeable to SNF placement at time of discharge. Patient reports preference for "somewhere clean", and did not give a specific facility.  Patient agreed for CSW to fax referral to different facilities in the area.  CSW discussed insurance authorization process and provided Medicare SNF ratings list. Patient has received the COVID vaccines. Patient expressed being hopeful for rehab and to feel better soon. No further questions reported at this time.     Expected Discharge Plan: Skilled Nursing Facility Barriers to Discharge: SNF Pending bed offer, Continued Medical Work up   Patient Goals and CMS Choice Patient states their goals for this hospitalization and ongoing recovery are:: To go home if possible   Choice offered to / list presented to : Patient  Expected Discharge Plan and Services Expected Discharge Plan: Skilled Nursing Facility       Living arrangements for the past 2 months: Single Family Home                                      Prior Living Arrangements/Services Living arrangements for the past 2 months: Single Family Home Lives with:: Self Patient language and need for interpreter reviewed:: Yes Do you feel safe going back to the place where you live?: Yes      Need for Family Participation in Patient Care: No (Comment) Care giver support system in place?: Yes (comment)   Criminal Activity/Legal Involvement  Pertinent to Current Situation/Hospitalization: No - Comment as needed  Activities of Daily Living Home Assistive Devices/Equipment: Other (Comment), Walker (specify type) (chair lift) ADL Screening (condition at time of admission) Patient's cognitive ability adequate to safely complete daily activities?: Yes Is the patient deaf or have difficulty hearing?: No Does the patient have difficulty seeing, even when wearing glasses/contacts?: No Does the patient have difficulty concentrating, remembering, or making decisions?: No Patient able to express need for assistance with ADLs?: Yes Does the patient have difficulty dressing or bathing?: No Independently performs ADLs?: Yes (appropriate for developmental age) Does the patient have difficulty walking or climbing stairs?: No (chair lift) Weakness of Legs: None Weakness of Arms/Hands: None  Permission Sought/Granted   Permission granted to share information with : Yes, Verbal Permission Granted     Permission granted to share info w AGENCY: SNF        Emotional Assessment   Attitude/Demeanor/Rapport: Engaged Affect (typically observed): Accepting Orientation: : Oriented to Self, Oriented to Place, Oriented to  Time, Oriented to Situation Alcohol / Substance Use: Not Applicable Psych Involvement: No (comment)  Admission diagnosis:  Wrist pain, right [M25.531] Pain and swelling of right wrist [M25.531, M25.431] Wrist pain [M25.539] Patient Active Problem List   Diagnosis Date Noted   Wrist pain 08/30/2020   Pain and swelling of right wrist 08/28/2020   S/P reverse total  shoulder arthroplasty, 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   Lung nodule < 6cm on CT 01/02/2020   Rash and nonspecific skin eruption 12/17/2018   Fever 09/30/2018   Status post arthroscopy of left shoulder 01/16/2018   Estrogen deficiency 06/28/2017   Medial epicondylitis, left elbow 04/29/2017   S/P arthroscopy of left shoulder 02/25/2017   Impingement syndrome of left shoulder  01/30/2017   Pedal edema 10/26/2016   Trochanteric bursitis, right hip 04/10/2016   Essential hypertension 09/02/2015   Urge incontinence 07/27/2015   Obesity 04/28/2015   Need for hepatitis C screening test 04/26/2015   Screening for HIV (human immunodeficiency virus) 04/26/2015   Osteoarthritis of left knee 01/28/2015   Status post total left knee replacement 01/28/2015   Vitamin D deficiency 04/27/2014   Seasonal and perennial allergic rhinitis 06/17/2013   S/P total hip arthroplasty 03/20/2013   Medicare annual wellness visit, subsequent 11/18/2012   History of falling 11/18/2012   Routine general medical examination at a health care facility 11/10/2012   Lichen sclerosus et atrophicus of the vulva 07/29/2012   Osteopenia 10/19/2011   Other screening mammogram 10/19/2011   Hypothyroidism 10/19/2011   ANEMIA 10/11/2009   PERIPHERAL NEUROPATHY, FEET 08/05/2009   BACK PAIN 08/05/2009   HAND PAIN, BILATERAL 08/05/2009   TREMOR 03/09/2008   MENOPAUSAL SYNDROME 10/09/2007   DEPRESSION, MAJOR 05/20/2007   BIPOLAR AFFECTIVE DISORDER 05/20/2007   Generalized anxiety disorder 05/20/2007   PERSONALITY DISORDER 05/20/2007   ESOPHAGEAL SPASM 05/20/2007   HIATAL HERNIA 05/20/2007   AMAUROSIS FUGAX 05/07/2007   COPD (chronic obstructive pulmonary disease) (HCC) 03/27/2007   HYPERCHOLESTEROLEMIA, PURE 12/10/2006   SYMPTOM, SYNDROME, CHRONIC FATIGUE 12/10/2006   PCP:  [name], MD Pharmacy:   [facility] [id number] - [place], [place] - [address] [place] [address] Phone: [phone] Fax: [phone]     Social Determinants of Health (SDOH) Interventions    Readmission Risk Interventions No flowsheet data found.

## 2020-09-06 NOTE — Progress Notes (Signed)
Occupational Therapy Treatment Patient Details Name: Lori Orozco MRN: [id number] DOB: 02-26-1951 Today's Date: 09/06/2020    History of present illness [name] admitted with severe Rt wrist pain.  MRI showed findings concerning for inflammatory arthropathy without direct clear-cut evidence of septic arthritis.  She underwent Rt tynosynovectomy on 7/8.  PMH includes: amaurosis Fugax, anxiety, chronic fatigue syndrome, CKD, COPD, COVID, Emphysema, Fibromyalgia, IBS, Linchen sclerosus, Migraines, vertigo, dexa - osteopenia, s/p bil. TKA, s/p Lt TSA   OT comments  Pt not in arm elevator on arrival to room. Pt with desensitivity to hand/arm today allowing retrograde massage and A/AA/PROM of all digits and wrist. Pt had increased difficulty mobilizing to chair due to R knee pain -Messaged MD regarding increased fluid R knee and knees limiting pt's mobility. Coban used for compression RUE. Will return this pm to reassess and check skin. If pt continues to require significant assistance for mobility, she may need rehab at [facility]. May benefit from use of platform RW to increase ability to mobilize.  Follow Up Recommendations  Outpatient OT;Supervision/Assistance - 24 hour (pending progress; May need SNF for rehab)    Equipment Recommendations  None recommended by OT    Recommendations for Other Services      Precautions / Restrictions Precautions Precautions: Fall Required Braces or Orthoses: Other Brace Other Brace: R arm elevator       Mobility Bed Mobility Overal bed mobility: Needs Assistance Bed Mobility: Supine to Sit;Sit to Supine     Supine to sit: Min assist;HOB elevated;Mod assist Sit to supine: Min assist;Mod assist   General bed mobility comments: assist to support RUE and shift to edge of bed    Transfers Overall transfer level: Needs assistance Equipment used: 1 person hand held assist Transfers: Sit to/from Stand Sit to Stand: Mod assist Stand pivot  transfers: Min assist (toward L) Unable to ambulate            Balance Overall balance assessment: Needs assistance Sitting-balance support: Feet supported Sitting balance-Leahy Scale: Fair     Standing balance support: Single extremity supported Standing balance-Leahy Scale: Poor                             ADL either performed or assessed with clinical judgement   ADL   Eating/Feeding: Set up;Sitting   Grooming: Set up;Oral care;Wash/dry face;Wash/dry hands;Sitting                               Functional mobility during ADLs: Moderate assistance       Vision       Perception     Praxis      Cognition Arousal/Alertness: Awake/alert Behavior During Therapy: Anxious (at times) Overall Cognitive Status: Within Functional Limits for tasks assessed                                          Exercises General Exercises - Upper Extremity Shoulder Flexion: AROM;Right;10 reps;Supine Elbow Flexion: AROM;Right;10 reps;Supine Elbow Extension: AROM;Right;10 reps;Supine Wrist Extension: AAROM;Right;10 reps;Supine Digit Composite Flexion: PROM;AAROM;Right;10 reps;Supine Composite Extension: PROM;AAROM;Right;10 reps;Supine Hand Exercises Forearm Supination: AAROM;Right;10 reps;Supine Forearm Pronation: AROM;Right;10 reps;Supine Wrist Flexion: PROM;AAROM;Right;10 reps;Supine Wrist Extension: PROM;AAROM;Right;10 reps;Supine Digit Composite Flexion: AAROM;Right;10 reps;Supine;Other (comment) Composite Extension: AAROM;AROM;Right;10 reps;Seated Other Exercises Other Exercises: composite extension x  10 - AAROM Other Exercises: tumb circels AAROM Other Exercises: compression wrap used with coban   Shoulder Instructions       General Comments      Pertinent Vitals/ Pain       Pain Assessment: Faces Faces Pain Scale: Hurts even more Pain Location: Rt hand and wrist Pain Descriptors / Indicators: Grimacing;Guarding;Sharp Pain  Intervention(s): Limited activity within patient's tolerance;Repositioned  Home Living                                          Prior Functioning/Environment              Frequency  Min 3X/week        Progress Toward Goals  OT Goals(current goals can now be found in the care plan section)  Progress towards OT goals: Progressing toward goals  Acute Rehab OT Goals Patient Stated Goal: to be able to get back home OT Goal Formulation: With patient Time For Goal Achievement: 09/17/20 Potential to Achieve Goals: Good ADL Goals Pt Will Perform Upper Body Bathing: with set-up;sitting Pt Will Perform Upper Body Dressing: with set-up;sitting Pt/caregiver will Perform Home Exercise Program: Right Upper extremity;With written HEP provided;Increased ROM;Independently Additional ADL Goal #1: Pt will independently verbalize 3 strategies to reduce edema  Plan Discharge plan remains appropriate;Other (comment) (pending progress)    Co-evaluation                 AM-PAC OT "6 Clicks" Daily Activity     Outcome Measure   Help from another person eating meals?: A Little Help from another person taking care of personal grooming?: A Little Help from another person toileting, which includes using toliet, bedpan, or urinal?: A Lot Help from another person bathing (including washing, rinsing, drying)?: A Lot Help from another person to put on and taking off regular upper body clothing?: A Lot Help from another person to put on and taking off regular lower body clothing?: A Lot 6 Click Score: 14    End of Session Equipment Utilized During Treatment: Gait belt  OT Visit Diagnosis: Pain;Muscle weakness (generalized) (M62.81);Unsteadiness on feet (R26.81) Pain - Right/Left: Right Pain - part of body: Hand;Arm   Activity Tolerance Patient tolerated treatment well   Patient Left in chair;with call bell/phone within reach   Nurse Communication Mobility status         Time: 1103-1594 OT Time Calculation (min): 56 min  Charges: OT General Charges $OT Visit: 1 Visit OT Treatments $Self Care/Home Management : 8-22 mins $Therapeutic Activity: 23-37 mins $Therapeutic Exercise: 8-22 mins  Luisa Dago, OT/L   Acute OT Clinical Specialist Acute Rehabilitation Services Pager [phone] Office [phone]    [facility] 09/06/2020, 2:25 PM

## 2020-09-06 NOTE — NC FL2 (Signed)
[place] MEDICAID FL2 LEVEL OF CARE SCREENING TOOL     IDENTIFICATION  Patient Name: Lori Orozco Birthdate: 1950-12-14 Sex: female Admission Date (Current Location): 08/28/2020  [facility] and [place] Number:  Producer, television/film/video and Address:  [facility], [address], [place], [place] [address]      Provider Number: [id number]  Attending Physician Name and Address:  Leatha Gilding, MD  Relative Name and Phone Number:  Rayna Sexton (Sister)   [phone]    Current Level of Care: Hospital Recommended Level of Care: Skilled Nursing Facility Prior Approval Number:    Date Approved/Denied:   PASRR Number: [id number]  Discharge Plan: SNF    Current Diagnoses: Patient Active Problem List   Diagnosis Date Noted   Wrist pain 08/30/2020   Pain and swelling of right wrist 08/28/2020   S/P reverse total shoulder arthroplasty, 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   Lung nodule < 6cm on CT 01/02/2020   Rash and nonspecific skin eruption 12/17/2018   Fever 09/30/2018   Status post arthroscopy of left shoulder 01/16/2018   Estrogen deficiency 06/28/2017   Medial epicondylitis, left elbow 04/29/2017   S/P arthroscopy of left shoulder 02/25/2017   Impingement syndrome of left shoulder 01/30/2017   Pedal edema 10/26/2016   Trochanteric bursitis, right hip 04/10/2016   Essential hypertension 09/02/2015   Urge incontinence 07/27/2015   Obesity 04/28/2015   Need for hepatitis C screening test 04/26/2015   Screening for HIV (human immunodeficiency virus) 04/26/2015   Osteoarthritis of left knee 01/28/2015   Status post total left knee replacement 01/28/2015   Vitamin D deficiency 04/27/2014   Seasonal and perennial allergic rhinitis 06/17/2013   S/P total hip arthroplasty 03/20/2013   Medicare annual wellness visit, subsequent 11/18/2012   History of falling 11/18/2012   Routine general medical examination at a health care facility 11/10/2012   Lichen sclerosus et  atrophicus of the vulva 07/29/2012   Osteopenia 10/19/2011   Other screening mammogram 10/19/2011   Hypothyroidism 10/19/2011   ANEMIA 10/11/2009   PERIPHERAL NEUROPATHY, FEET 08/05/2009   BACK PAIN 08/05/2009   HAND PAIN, BILATERAL 08/05/2009   TREMOR 03/09/2008   MENOPAUSAL SYNDROME 10/09/2007   DEPRESSION, MAJOR 05/20/2007   BIPOLAR AFFECTIVE DISORDER 05/20/2007   Generalized anxiety disorder 05/20/2007   PERSONALITY DISORDER 05/20/2007   ESOPHAGEAL SPASM 05/20/2007   HIATAL HERNIA 05/20/2007   AMAUROSIS FUGAX 05/07/2007   COPD (chronic obstructive pulmonary disease) (HCC) 03/27/2007   HYPERCHOLESTEROLEMIA, PURE 12/10/2006   SYMPTOM, SYNDROME, CHRONIC FATIGUE 12/10/2006    Orientation RESPIRATION BLADDER Height & Weight     Self, Time, Situation, Place  Other (Comment) (Nasal Cannula) Continent Weight: 194 lb (88 kg) Height:  5\' 6"  (167.6 cm)  BEHAVIORAL SYMPTOMS/MOOD NEUROLOGICAL BOWEL NUTRITION STATUS      Continent Diet (see d/c summary)  AMBULATORY STATUS COMMUNICATION OF NEEDS Skin   Limited Assist Verbally Surgical wounds                       Personal Care Assistance Level of Assistance  Bathing, Dressing, Feeding Bathing Assistance: Limited assistance Feeding assistance: Independent Dressing Assistance: Limited assistance     Functional Limitations Info  Sight, Hearing, Speech Sight Info: Adequate Hearing Info: Adequate Speech Info: Adequate    SPECIAL CARE FACTORS FREQUENCY  PT (By licensed PT), OT (By licensed OT)     PT Frequency: 5x/week OT Frequency: 5x/week            Contractures Contractures  Info: Not present    Additional Factors Info  Code Status, Allergies Code Status Info: Full Allergies Info: Lithium   Tegretol (Carbamazepine)   Tricyclic Antidepressants   Strawberry Extract   Codeine   Cymbalta (Duloxetine Hcl)   Erythromycin   Lyrica (Pregabalin)   Neurontin (Gabapentin)   Rabeprazole Sodium   Duraprep (Antiseptic Products,  Misc.)   Penicillins   Tape           Current Medications (09/06/2020):  This is the current hospital active medication list Current Facility-Administered Medications  Medication Dose Route Frequency Provider Last Rate Last Admin   0.9 %  sodium chloride infusion   Intravenous PRN [name], MD 10 mL/hr at 08/31/20 0024 Infusion Verify at 08/31/20 0024   acetaminophen (TYLENOL) tablet 650 mg  650 mg Oral Q6H PRN [name], MD   650 mg at 09/04/20 2130   Or   acetaminophen (TYLENOL) suppository 650 mg  650 mg Rectal Q6H PRN [name], MD       albuterol (PROVENTIL) (2.5 MG/3ML) 0.083% nebulizer solution 2.5 mg  2.5 mg Inhalation Q6H PRN [name], MD       buPROPion (WELLBUTRIN XL) 24 hr tablet 150 mg  150 mg Oral Daily [name], MD   150 mg at 09/06/20 0851   busPIRone (BUSPAR) tablet 15 mg  15 mg Oral BID AC [name], MD   15 mg at 09/06/20 7893   diclofenac Sodium (VOLTAREN) 1 % topical gel 2 g  2 g Topical BID PRN [name], MD       divalproex (DEPAKOTE ER) 24 hr tablet 500 mg  500 mg Oral QHS [name], MD   500 mg at 09/05/20 2115   estrogens (conjugated) (PREMARIN) tablet 0.625 mg  0.625 mg Oral Daily [name], MD   0.625 mg at 09/06/20 0851   fluticasone (FLONASE) 50 MCG/ACT nasal spray 2 spray  2 spray Each Nare Daily PRN [name], MD       hydrALAZINE (APRESOLINE) injection 10 mg  10 mg Intravenous Q6H PRN [name], MD       HYDROmorphone (DILAUDID) injection 0.5 mg  0.5 mg Intravenous Q3H PRN [name], MD   0.5 mg at 09/03/20 0957   Or   HYDROmorphone (DILAUDID) injection 1 mg  1 mg Intravenous Q3H PRN [name], MD   1 mg at 09/01/20 0739   ipratropium-albuterol (DUONEB) 0.5-2.5 (3) MG/3ML nebulizer solution 3 mL  3 mL Nebulization Q6H PRN [name], MD       ketotifen (ZADITOR)  0.025 % ophthalmic solution 1 drop  1 drop Both Eyes BID PRN [name], MD       levothyroxine (SYNTHROID) tablet 50 mcg  50 mcg Oral QHS [name], MD   50 mcg at 09/05/20 2116   LORazepam (ATIVAN) tablet 1 mg  1 mg Oral QHS PRN [name], MD   1 mg at 09/04/20 2129   metoprolol tartrate (LOPRESSOR) tablet 12.5 mg  12.5 mg Oral BID Leatha Gilding, MD   12.5 mg at 09/06/20 0851   mirabegron ER (MYRBETRIQ) tablet 25 mg  25 mg Oral QHS [name], MD   25 mg at 09/05/20 2117   mirtazapine (REMERON) tablet 30 mg  30 mg Oral QHS [name], MD   30 mg at 09/05/20 2117   ondansetron (ZOFRAN) tablet 4 mg  4 mg Oral Q6H PRN [name], MD       Or   ondansetron [facility]) injection 4 mg  4 mg Intravenous Q6H PRN [name], MD       oxyCODONE (Oxy IR/ROXICODONE) immediate release tablet 5-10 mg  5-10 mg Oral Q4H PRN [name], MD   10 mg at 09/05/20 2114   pantoprazole (PROTONIX) EC tablet 40 mg  40 mg Oral BID [name], MD   40 mg at 09/06/20 0851   polyethylene glycol (MIRALAX / GLYCOLAX) packet 17 g  17 g Oral Daily PRN [name], MD       sertraline (ZOLOFT) tablet 100 mg  100 mg Oral Daily [name], MD   100 mg at 09/06/20 0851   simvastatin (ZOCOR) tablet 20 mg  20 mg Oral QHS [name], MD   20 mg at 09/05/20 2118     Discharge Medications: Please see discharge summary for a list of discharge medications.  Relevant Imaging Results:  Relevant Lab Results:   Additional Information SSN:  [id number];   Moderna COVID-19 Vaccine 01/22/2020 , 07/19/2019 , 06/21/2019  Pfizer COVID-19 Vaccine 06/04/2020  [employer]ina [employer] Lavra Imler, LCSWA

## 2020-09-07 DIAGNOSIS — M25531 Pain in right wrist: Secondary | ICD-10-CM | POA: Diagnosis not present

## 2020-09-07 DIAGNOSIS — M25431 Effusion, right wrist: Secondary | ICD-10-CM | POA: Diagnosis not present

## 2020-09-07 LAB — SYNOVIAL CELL COUNT + DIFF, W/ CRYSTALS
Crystals, Fluid: NONE SEEN
Eosinophils-Synovial: 0 % (ref 0–1)
Lymphocytes-Synovial Fld: 0 % (ref 0–20)
Monocyte-Macrophage-Synovial Fluid: 10 % — ABNORMAL LOW (ref 50–90)
Neutrophil, Synovial: 90 % — ABNORMAL HIGH (ref 0–25)
WBC, Synovial: 4525 /mm3 — ABNORMAL HIGH (ref 0–200)

## 2020-09-07 LAB — CYCLIC CITRUL PEPTIDE ANTIBODY, IGG/IGA: CCP Antibodies IgG/IgA: 15 units (ref 0–19)

## 2020-09-07 LAB — RESP PANEL BY RT-PCR (FLU A&B, COVID) ARPGX2
Influenza A by PCR: NEGATIVE
Influenza B by PCR: NEGATIVE
SARS Coronavirus 2 by RT PCR: NEGATIVE

## 2020-09-07 MED ORDER — OXYCODONE HCL 5 MG PO TABS: 5.0000 mg | ORAL_TABLET | Freq: Four times a day (QID) | ORAL | 0 refills | Status: DC | PRN

## 2020-09-07 MED ORDER — DICLOFENAC SODIUM 1 % EX GEL
2.0000 g | Freq: Two times a day (BID) | CUTANEOUS | Status: DC | PRN
Start: 2020-09-07 — End: 2023-11-11

## 2020-09-07 MED ORDER — METOPROLOL TARTRATE 25 MG PO TABS: 12.5000 mg | ORAL_TABLET | Freq: Two times a day (BID) | ORAL | Status: DC

## 2020-09-07 NOTE — Progress Notes (Signed)
Pt report given to [place] , receiving Charity fundraiser for [employer].Awaiting for [facility] to transport.

## 2020-09-07 NOTE — Discharge Summary (Signed)
Lori Orozco:[id number] DOB: 01-Jan-1951 DOA: 08/28/2020  PCP: [name], MD  Admit date: 08/28/2020 Discharge date: 09/07/2020  Admitted From: Home Disposition: SNF  Recommendations for Outpatient Follow-up:  Follow up with PCP in 1 week Please obtain BMP/CBC in one week Please follow up on the following pending results:     Discharge Condition:Stable CODE STATUS: Full Diet recommendation: Heart Healthy  Brief/Interim Summary: Per HPI 70 year old female with past medical history of hypothyroidism, hyperlipidemia, generalized anxiety disorder, hypertension who presents to [facility] emergency department with complaints of right wrist pain.Orthopedic surgery saw her as an outpatient, had trialed a Medrol Dosepak without success and she eventually decided to come to the ED.  Orthopedic surgery consulted and following.she eventually was seen in orthopedic surgery clinic on 6/23.  At that time, an x-ray was performed which was found to be unremarkable.  Patient's wrist was placed in a splint and patient was placed on a course of colchicine.  Over the next several days patient's symptoms did not improved.  Patient presented once again to the orthopedic surgery clinic on 6/27.  During that visit a joint aspiration was performed.  Unfortunately, cell count and crystal analysis could not be interpreted due to clotting of the sample.  No organisms could be identified on the gram stain.  Patient was therefore sent home on a course of oral doxycycline and prednisone. Patient presented once again to Dr. [name] in orthopedic surgery clinic on 6/30 for repeat assessment and during .Patient eventually presented to [facility] emergency department for evaluation.  Pain and swelling of the right wrist-imaging showed no evidence of bony destruction.  Orthopedic surgery consulted . -Underwent an MRI which showed findings concerning for inflammatory arthropathy without direct  clear-cut evidence of septic arthritis.  She had a wrist aspirate as an outpatient and that was culture negative.  She was given a trial of steroids which initially helped then pain and swelling reoccurred.  Given lack of improvement with steroids she was taken to the OR on 7/7 and she is status post right wrist dorsal compartment extensor tendon tenosynovectomy. -Rule out rheumatological causes: ANA negative.  CCP antibodies IgG 15.  RA 14.2. Since she is weak, PT recommended SNF I spoke to [place] PA Mr. Chestine Spore this  am he recommended: Keep arm elevated, in the sling. OT can work on range of motion.  Recommend Voltaren gel to the right hand index finger PIP joint 2 g up to 4 times daily  Right knee pain- this am.  Ortho evaluated and aspirated.   Per ortho: We will send aspirate from the knee off for cell count and crystals.  Patient will follow up with [place] in the office 2 weeks status post tenosynovectomy right wrist.They will have results when she follow up with them.    GAD-continue home medications, Wellbutrin, BuSpar, Depakote, Zoloft COPD-stable, no wheezing continue as needed nebulizers Hypothyroidism-stable, continue Synthroid Hyperlipidemia-continue statin Sinus tach / essential hypertension-likely worsened by presence of pain, she was started on beta-blockers with improvement in her blood pressure as well as heart rate   Discharge Diagnoses:  Principal Problem:   Pain and swelling of right wrist Active Problems:   Generalized anxiety disorder   COPD (chronic obstructive pulmonary disease) (HCC)   Hypothyroidism   Essential hypertension   Wrist pain    Discharge Instructions  Discharge Instructions     Diet - low sodium heart healthy   Complete by: As directed    Discharge wound  care:   Complete by: As directed    As above   Increase activity slowly   Complete by: As directed       Allergies as of 09/07/2020       Reactions   Lithium Anaphylaxis   Tegretol  [carbamazepine] Other (See Comments)   Fever and body aches (fever over 103)   Tricyclic Antidepressants Anaphylaxis   Strawberry Extract Hives   Codeine Nausea Only   Makes pt stay awake   Cymbalta [duloxetine Hcl] Other (See Comments)   Makes pt pass out   Erythromycin    abdominal pain   Lyrica [pregabalin]    Felt faint   Neurontin [gabapentin]    Passes  out   Rabeprazole Sodium    insomnia   Duraprep [antiseptic Products, Misc.] Itching, Rash   Tolerates Betadine   Penicillins Rash   Tolerated ANCEF 02/02/20 Has patient had a PCN reaction causing immediate rash, facial/tongue/throat swelling, SOB or lightheadedness with hypotension: Yes Has patient had a PCN reaction causing severe rash involving mucus membranes or skin necrosis: No Has patient had a PCN reaction that required hospitalization No Has patient had a PCN reaction occurring within the last 10 years: No If all of the above answers are "NO", then may proceed with Cephalosporin use.   Tape Rash   PT ALLERGIC NYLON TAPE         Medication List     STOP taking these medications    Colchicine 0.6 MG Caps   HYDROcodone-acetaminophen 7.5-325 MG tablet Commonly known as: NORCO       TAKE these medications    albuterol 108 (90 Base) MCG/ACT inhaler Commonly known as: VENTOLIN HFA Inhale 2 puffs into the lungs every 6 (six) hours as needed for wheezing or shortness of breath.   buPROPion 150 MG 24 hr tablet Commonly known as: WELLBUTRIN XL Take 150 mg by mouth daily.   busPIRone 15 MG tablet Commonly known as: BUSPAR Take 15 mg by mouth 2 (two) times daily before a meal.   cyanocobalamin 2000 MCG tablet Take 2,000 mcg by mouth daily.   diclofenac Sodium 1 % Gel Commonly known as: VOLTAREN Apply 2 g topically 2 (two) times daily as needed (pain).   diphenhydrAMINE 25 MG tablet Commonly known as: BENADRYL Take 25 mg by mouth every 6 (six) hours as needed for allergies.   divalproex 500 MG 24 hr  tablet Commonly known as: DEPAKOTE ER Take 500 mg by mouth at bedtime.   doxycycline 100 MG tablet Commonly known as: VIBRA-TABS Take 1 tablet (100 mg total) by mouth 2 (two) times daily for 14 days.   ibuprofen 200 MG tablet Commonly known as: ADVIL Take 200 mg by mouth every 6 (six) hours as needed for moderate pain.   ketotifen 0.025 % ophthalmic solution Commonly known as: ZADITOR Place 1 drop into both eyes 2 (two) times daily as needed (allergies).   LORazepam 1 MG tablet Commonly known as: ATIVAN Take 1 mg by mouth at bedtime as needed for anxiety or sleep.   metoprolol tartrate 25 MG tablet Commonly known as: LOPRESSOR Take 0.5 tablets (12.5 mg total) by mouth 2 (two) times daily.   mirtazapine 30 MG tablet Commonly known as: REMERON Take 30 mg by mouth at bedtime.   ondansetron 4 MG disintegrating tablet Commonly known as: ZOFRAN-ODT Take 4 mg by mouth every 8 (eight) hours as needed for nausea or vomiting.   oxyCODONE 5 MG immediate release tablet Commonly known as:  Oxy IR/ROXICODONE Take 1-2 tablets (5-10 mg total) by mouth every 6 (six) hours as needed for severe pain.   predniSONE 50 MG tablet Commonly known as: DELTASONE Take 1 tablet (50 mg total) by mouth daily with breakfast.   sertraline 100 MG tablet Commonly known as: ZOLOFT Take 100 mg by mouth daily.   simvastatin 20 MG tablet Commonly known as: ZOCOR TAKE ONE TABLET BY MOUTH EVERY NIGHT AT BEDTIME   Vitamin D3 50 MCG (2000 UT) Tabs Take 2,000 Units by mouth daily.       ASK your doctor about these medications    clobetasol cream 0.05 % Commonly known as: TEMOVATE APPLY SMALL AMOUNT TO AFFECTED AREAS TWO TO THREE TIMES PER WEEK   fluticasone 50 MCG/ACT nasal spray Commonly known as: FLONASE PLACE 2 SPRAYS INTO THE NOSE DAILY.   ipratropium-albuterol 0.5-2.5 (3) MG/3ML Soln Commonly known as: DUONEB Take 3 mLs by nebulization every 6 (six) hours as needed.   levothyroxine 50 MCG  tablet Commonly known as: SYNTHROID TAKE ONE TABLET BY MOUTH DAILY BEFORE BREAKFAST   mirabegron ER 25 MG Tb24 tablet Commonly known as: Myrbetriq Take 1 tablet (25 mg total) by mouth daily.   Premarin 0.625 MG tablet Generic drug: estrogens (conjugated) Take 1 tablet (0.625 mg total) by mouth daily. Take one tablet daily               Discharge Care Instructions  (From admission, onward)           Start     Ordered   09/07/20 0000  Discharge wound care:       Comments: As above   09/07/20 1054            Follow-up Information     [name], MD. Schedule an appointment as soon as possible for a visit in 2 week(s).   Specialty: Orthopedic Surgery Contact information: [address] [place] [address] [phone]         [name], MD Follow up in 1 week(s).   Specialties: Family Medicine, Radiology Contact information: [address] [place] [address] [phone]                Allergies  Allergen Reactions   Lithium Anaphylaxis   Tegretol [Carbamazepine] Other (See Comments)    Fever and body aches (fever over 103)   Tricyclic Antidepressants Anaphylaxis   Strawberry Extract Hives   Codeine Nausea Only    Makes pt stay awake   Cymbalta [Duloxetine Hcl] Other (See Comments)    Makes pt pass out    Erythromycin     abdominal pain   Lyrica [Pregabalin]     Felt faint   Neurontin [Gabapentin]     Passes  out   Rabeprazole Sodium     insomnia   Duraprep [Antiseptic Products, Misc.] Itching and Rash    Tolerates Betadine    Penicillins Rash    Tolerated ANCEF 02/02/20  Has patient had a PCN reaction causing immediate rash, facial/tongue/throat swelling, SOB or lightheadedness with hypotension: Yes Has patient had a PCN reaction causing severe rash involving mucus membranes or skin necrosis: No Has patient had a PCN reaction that required hospitalization No Has patient had a PCN reaction  occurring within the last 10 years: No If all of the above answers are "NO", then may proceed with Cephalosporin use.    Tape Rash    PT ALLERGIC NYLON TAPE     Consultations: orthopedics  Procedures/Studies: DG Wrist Complete Right  Result Date: 08/28/2020 CLINICAL DATA:  Pain and swelling. Limited range of motion without pain. EXAM: RIGHT WRIST - COMPLETE 3+ VIEW COMPARISON:  08/18/2020 FINDINGS: There is no acute fracture. There is ulnar minus variance. There is well corticated fragmentation of the ulnar styloid region, consistent with remote injury. Degenerative changes are identified at the first carpometacarpal joint and radiocarpal joint. Degenerative changes of the interphalangeal joint of the thumb. IMPRESSION: Degenerative changes.  No evidence for acute  abnormality. Electronically Signed   By: Norva Pavlov M.D.   On: 08/28/2020 15:07   MR WRIST RIGHT WO CONTRAST  Result Date: 08/30/2020 CLINICAL DATA:  Progressive pain over the last month, possible soft tissue infection EXAM: MR OF THE RIGHT WRIST WITHOUT CONTRAST TECHNIQUE: Multiplanar, multisequence MR imaging of the right wrist was performed. No intravenous contrast was administered. COMPARISON:  Radiographs dated 08/28/2020 FINDINGS: Despite efforts by the technologist and patient, motion artifact is present on today's exam and could not be eliminated. This reduces exam sensitivity and specificity. Ligaments: Unremarkable Triangular fibrocartilage: Poor conspicuity of the distal TFCC for example on image 18 series 6, this may be attaching to the small ossicles above the remaining ulnar styloid which appear to sit just below the meniscal homologue. Tendons: Extensor carpi ulnaris peritendinitis or tenosynovitis. Carpal tunnel/median nerve: Unremarkable Guyon's canal: Unremarkable Joint/cartilage: Small effusion of the distal radioulnar joint. Possible synovitis. Radiocarpal joint effusion with indistinct margins favoring  synovitis. Bones/carpal alignment: 3 mm negative ulnar variance. No lunateomalacia. Mild degenerative spurring at the first carpometacarpal articulation. Low-level periarticular edema at the second, third, and fourth carpometacarpal articulations. Small foci of edema scattered in the carpus. Other: Substantial subcutaneous edema dorsally in the wrist, extending medially and laterally as well. IMPRESSION: 1. Effusion and synovitis in the radiocarpal joint and also in the distal radioulnar joint. This may be secondary to inflammatory arthropathy, infection is not excluded. 2. Dorsal subcutaneous edema along the wrist, cellulitis is not excluded. 3. Poor definition of the distal TFCC cartilage in the vicinity of the small ossicles along the ulnar styloid tip, query remote injury. 4. Extensor carpi ulnaris peritendinitis or tenosynovitis. 5. Subtle areas of subcortical marrow edema along the second, third, and fourth carpometacarpal articulations attributable to arthropathy. Electronically Signed   By: Gaylyn Rong M.D.   On: 08/30/2020 11:40   XR Wrist Complete Right  Result Date: 08/18/2020 Right wrist 3 views: No acute fractures or acute findings.  Wrist well located.  Heterotopic bone seen in the ulnar gutter region.  Otherwise no bony abnormalities.     Subjective: No complaints of sob, cp, dizziness  Discharge Exam: Vitals:   09/07/20 0754 09/07/20 1143  BP: 137/66 138/63  Pulse: 76 69  Resp: 18 18  Temp: 97.9 F (36.6 C) 97.7 F (36.5 C)  SpO2: 96% 96%   Vitals:   09/06/20 1428 09/06/20 2132 09/07/20 0754 09/07/20 1143  BP: 140/64 (!) 148/58 137/66 138/63  Pulse: 78 90 76 69  Resp: Temp: 98 F (36.7 C) 98.4 F (36.9 C) 97.9 F (36.6 C) 97.7 F (36.5 C)  TempSrc: Oral Oral Oral Oral  SpO2: (!) 85% 91% 96% 96%  Weight:      Height:        General: Pt is alert, awake, not in acute distress Cardiovascular: RRR, S1/S2 +, no rubs, no gallops Respiratory: CTA  bilaterally, no wheezing, no rhonchi Abdominal: Soft, NT, ND, bowel sounds + Extremities: no edema ,  rue elevated and in sling    The results of significant diagnostics from this hospitalization (including imaging, microbiology, ancillary and laboratory) are listed below for reference.     Microbiology: Recent Results (from the past 240 hour(s))  Culture, blood (routine x 2)     Status: None   Collection Time: 08/29/20  1:10 AM   Specimen: BLOOD  Result Value Ref Range Status   Specimen Description BLOOD RIGHT ANTECUBITAL  Final   Special Requests   Final    BOTTLES DRAWN AEROBIC AND ANAEROBIC Blood Culture results may not be optimal due to an excessive volume of blood received in culture bottles   Culture   Final    NO GROWTH 5 DAYS Performed at [facility], [address], [place], [place] [address]    Report Status 09/03/2020 FINAL  Final  Culture, blood (routine x 2)     Status: None   Collection Time: 08/29/20  1:38 AM   Specimen: BLOOD LEFT HAND  Result Value Ref Range Status   Specimen Description BLOOD LEFT HAND  Final   Special Requests   Final    BOTTLES DRAWN AEROBIC AND ANAEROBIC Blood Culture results may not be optimal due to an excessive volume of blood received in culture bottles   Culture   Final    NO GROWTH 5 DAYS Performed at [facility], [address], [place], [place] [address]    Report Status 09/03/2020 FINAL  Final  Culture, Urine     Status: None   Collection Time: 08/29/20  4:27 AM   Specimen: Urine, Clean Catch  Result Value Ref Range Status   Specimen Description URINE, CLEAN CATCH  Final   Special Requests NONE  Final   Culture   Final    NO GROWTH Performed at [facility], [address] [place], [place] [address]    Report Status 08/30/2020 FINAL  Final  Resp Panel by RT-PCR (Flu A&B, Covid) Nasopharyngeal Swab     Status: None   Collection Time: 08/29/20  5:55 AM   Specimen: Nasopharyngeal Swab; Nasopharyngeal(NP)  swabs in vial transport medium  Result Value Ref Range Status   SARS Coronavirus 2 by RT PCR NEGATIVE NEGATIVE Final    Comment: (NOTE) SARS-CoV-2 target nucleic acids are NOT DETECTED.  The SARS-CoV-2 RNA is generally detectable in upper respiratory specimens during the acute phase of infection. The lowest concentration of SARS-CoV-2 viral copies this assay can detect is 138 copies/mL. A negative result does not preclude SARS-Cov-2 infection and should not be used as the sole basis for treatment or other patient management decisions. A negative result may occur with  improper specimen collection/handling, submission of specimen other than nasopharyngeal swab, presence of viral mutation(s) within the areas targeted by this assay, and inadequate number of viral copies(<138 copies/mL). A negative result must be combined with clinical observations, patient history, and epidemiological information. The expected result is Negative.  Fact Sheet for Patients:  BloggerCourse.com  Fact Sheet for Healthcare Providers:  SeriousBroker.it  This test is no t yet approved or cleared by the [place] FDA and  has been authorized for detection and/or diagnosis of SARS-CoV-2 by FDA under an Emergency Use Authorization (EUA). This EUA will remain  in effect (meaning this test can be used) for the duration of the COVID-19 declaration under Section 564(b)(1) of the Act, 21 U.S.C.section 360bbb-3(b)(1), unless the authorization is terminated  or revoked sooner.  Influenza A by PCR NEGATIVE NEGATIVE Final   Influenza B by PCR NEGATIVE NEGATIVE Final    Comment: (NOTE) The Xpert Xpress SARS-CoV-2/FLU/RSV plus assay is intended as an aid in the diagnosis of influenza from Nasopharyngeal swab specimens and should not be used as a sole basis for treatment. Nasal washings and aspirates are unacceptable for Xpert Xpress  SARS-CoV-2/FLU/RSV testing.  Fact Sheet for Patients: BloggerCourse.com  Fact Sheet for Healthcare Providers: SeriousBroker.it  This test is not yet approved or cleared by the [place] FDA and has been authorized for detection and/or diagnosis of SARS-CoV-2 by FDA under an Emergency Use Authorization (EUA). This EUA will remain in effect (meaning this test can be used) for the duration of the COVID-19 declaration under Section 564(b)(1) of the Act, 21 U.S.C. section 360bbb-3(b)(1), unless the authorization is terminated or revoked.  Performed at [facility], [address], [place], [place] [address]   Surgical pcr screen     Status: None   Collection Time: 09/01/20 12:03 PM   Specimen: Nasal Mucosa; Nasal Swab  Result Value Ref Range Status   MRSA, PCR NEGATIVE NEGATIVE Final   Staphylococcus aureus NEGATIVE NEGATIVE Final    Comment: (NOTE) The Xpert SA Assay (FDA approved for NASAL specimens in patients 75 years of age and older), is one component of a comprehensive surveillance program. It is not intended to diagnose infection nor to guide or monitor treatment. Performed at [facility], [address] [place], [place] [address]      Labs: BNP (last 3 results) No results for input(s): BNP in the last 8760 hours. Basic Metabolic Panel: Recent Labs  Lab 09/02/20 0334 09/04/20 0405 09/05/20 0250 09/06/20 0246  NA 137 136 138 136  K 4.9 3.9 4.2 3.9  CL 100 96* 98 96*  CO2 31 35* 33* 33*  GLUCOSE 134* 111* 119* 116*  BUN 24* CREATININE 0.85 0.86 1.01* 0.92  CALCIUM 9.0 9.1 9.3 9.4   Liver Function Tests: Recent Labs  Lab 09/05/20 0250  AST 23  ALT 18  ALKPHOS 68  BILITOT 0.4  PROT 5.6*  ALBUMIN 2.2*   No results for input(s): LIPASE, AMYLASE in the last 168 hours. No results for input(s): AMMONIA in the last 168 hours. CBC: Recent Labs  Lab 09/02/20 0334 09/04/20 0405  09/05/20 0250 09/06/20 0246  WBC 8.1 9.1 8.4 8.5  HGB 11.0* 9.7* 10.7* 10.2*  HCT 35.2* 30.6* 34.6* 32.0*  MCV 98.9 98.1 98.6 97.3  PLT 316 323 309 333   Cardiac Enzymes: No results for input(s): CKTOTAL, CKMB, CKMBINDEX, TROPONINI in the last 168 hours. BNP: Invalid input(s): POCBNP CBG: No results for input(s): GLUCAP in the last 168 hours. D-Dimer No results for input(s): DDIMER in the last 72 hours. Hgb A1c No results for input(s): HGBA1C in the last 72 hours. Lipid Profile No results for input(s): CHOL, HDL, LDLCALC, TRIG, CHOLHDL, LDLDIRECT in the last 72 hours. Thyroid function studies No results for input(s): TSH, T4TOTAL, T3FREE, THYROIDAB in the last 72 hours.  Invalid input(s): FREET3 Anemia work up No results for input(s): VITAMINB12, FOLATE, FERRITIN, TIBC, IRON, RETICCTPCT in the last 72 hours. Urinalysis    Component Value Date/Time   COLORURINE YELLOW 08/29/2020 0427   APPEARANCEUR CLEAR 08/29/2020 0427   LABSPEC 1.028 08/29/2020 0427   PHURINE 5.0 08/29/2020 0427   GLUCOSEU NEGATIVE 08/29/2020 0427   HGBUR NEGATIVE 08/29/2020 0427   BILIRUBINUR NEGATIVE 08/29/2020 0427  BILIRUBINUR negative 07/27/2015 1020   KETONESUR 5 (A) 08/29/2020 0427   PROTEINUR NEGATIVE 08/29/2020 0427   UROBILINOGEN negative 07/27/2015 1020   NITRITE NEGATIVE 08/29/2020 0427   LEUKOCYTESUR NEGATIVE 08/29/2020 0427   Sepsis Labs Invalid input(s): PROCALCITONIN,  WBC,  LACTICIDVEN Microbiology Recent Results (from the past 240 hour(s))  Culture, blood (routine x 2)     Status: None   Collection Time: 08/29/20  1:10 AM   Specimen: BLOOD  Result Value Ref Range Status   Specimen Description BLOOD RIGHT ANTECUBITAL  Final   Special Requests   Final    BOTTLES DRAWN AEROBIC AND ANAEROBIC Blood Culture results may not be optimal due to an excessive volume of blood received in culture bottles   Culture   Final    NO GROWTH 5 DAYS Performed at [facility], [address], [place], [place] [address]    Report Status 09/03/2020 FINAL  Final  Culture, blood (routine x 2)     Status: None   Collection Time: 08/29/20  1:38 AM   Specimen: BLOOD LEFT HAND  Result Value Ref Range Status   Specimen Description BLOOD LEFT HAND  Final   Special Requests   Final    BOTTLES DRAWN AEROBIC AND ANAEROBIC Blood Culture results may not be optimal due to an excessive volume of blood received in culture bottles   Culture   Final    NO GROWTH 5 DAYS Performed at [facility], [address], [place], [place] [address]    Report Status 09/03/2020 FINAL  Final  Culture, Urine     Status: None   Collection Time: 08/29/20  4:27 AM   Specimen: Urine, Clean Catch  Result Value Ref Range Status   Specimen Description URINE, CLEAN CATCH  Final   Special Requests NONE  Final   Culture   Final    NO GROWTH Performed at [facility], [address], [place], [place] [address]    Report Status 08/30/2020 FINAL  Final  Resp Panel by RT-PCR (Flu A&B, Covid) Nasopharyngeal Swab     Status: None   Collection Time: 08/29/20  5:55 AM   Specimen: Nasopharyngeal Swab; Nasopharyngeal(NP) swabs in vial transport medium  Result Value Ref Range Status   SARS Coronavirus 2 by RT PCR NEGATIVE NEGATIVE Final    Comment: (NOTE) SARS-CoV-2 target nucleic acids are NOT DETECTED.  The SARS-CoV-2 RNA is generally detectable in upper respiratory specimens during the acute phase of infection. The lowest concentration of SARS-CoV-2 viral copies this assay can detect is 138 copies/mL. A negative result does not preclude SARS-Cov-2 infection and should not be used as the sole basis for treatment or other patient management decisions. A negative result may occur with  improper specimen collection/handling, submission of specimen other than nasopharyngeal swab, presence of viral mutation(s) within the areas targeted by this assay, and inadequate number of viral copies(<138 copies/mL).  A negative result must be combined with clinical observations, patient history, and epidemiological information. The expected result is Negative.  Fact Sheet for Patients:  BloggerCourse.com  Fact Sheet for Healthcare Providers:  SeriousBroker.it  This test is no t yet approved or cleared by the [place] FDA and  has been authorized for detection and/or diagnosis of SARS-CoV-2 by FDA under an Emergency Use Authorization (EUA). This EUA will remain  in effect (meaning this test can be used) for the duration of the COVID-19 declaration under Section 564(b)(1) of the Act, 21 U.S.C.section  360bbb-3(b)(1), unless the authorization is terminated  or revoked sooner.       Influenza A by PCR NEGATIVE NEGATIVE Final   Influenza B by PCR NEGATIVE NEGATIVE Final    Comment: (NOTE) The Xpert Xpress SARS-CoV-2/FLU/RSV plus assay is intended as an aid in the diagnosis of influenza from Nasopharyngeal swab specimens and should not be used as a sole basis for treatment. Nasal washings and aspirates are unacceptable for Xpert Xpress SARS-CoV-2/FLU/RSV testing.  Fact Sheet for Patients: BloggerCourse.com  Fact Sheet for Healthcare Providers: [email or web address]  This test is not yet approved or cleared by the [place] FDA and has been authorized for detection and/or diagnosis of SARS-CoV-2 by FDA under an Emergency Use Authorization (EUA). This EUA will remain in effect (meaning this test can be used) for the duration of the COVID-19 declaration under Section 564(b)(1) of the Act, 21 U.S.C. section 360bbb-3(b)(1), unless the authorization is terminated or revoked.  Performed at [facility], [address], [place], [place] [address]   Surgical pcr screen     Status: None   Collection Time: 09/01/20 12:03 PM   Specimen: Nasal Mucosa; Nasal Swab  Result Value Ref Range Status    MRSA, PCR NEGATIVE NEGATIVE Final   Staphylococcus aureus NEGATIVE NEGATIVE Final    Comment: (NOTE) The Xpert SA Assay (FDA approved for NASAL specimens in patients 66 years of age and older), is one component of a comprehensive surveillance program. It is not intended to diagnose infection nor to guide or monitor treatment. Performed at [facility], [address] [place], [place] [address]      Time coordinating discharge: Over 30 minutes  SIGNED:   Lynn Ito, MD  [facility] 09/07/2020, 12:36 PM Pager   If 7PM-7AM, please contact night-coverage [email or web address] Password [email or web address]

## 2020-09-07 NOTE — Progress Notes (Signed)
Subjective: 6 Days Post-Op Procedure(s) (LRB): TYNOSYNOVECTOMY (Right) Patient reports pain as moderate right wrist and severe right ring finger. Still having right knee pain and swelling.   Objective: Vital signs in last 24 hours: Temp:  [97.7 F (36.5 C)-98.4 F (36.9 C)] 97.7 F (36.5 C) (07/13 1143) Pulse Rate:  [69-90] 69 (07/13 1143) Resp:  [18] 18 (07/13 1143) BP: (137-148)/(58-66) 138/63 (07/13 1143) SpO2:  [85 %-96 %] 96 % (07/13 1143)  Intake/Output from previous day: 07/12 0701 - 07/13 0700 In: 720 [P.O.:720] Out: 500 [Urine:500] Intake/Output this shift: No intake/output data recorded.  Recent Labs    09/05/20 0250 09/06/20 0246  HGB 10.7* 10.2*   Recent Labs    09/05/20 0250 09/06/20 0246  WBC 8.4 8.5  RBC 3.51* 3.29*  HCT 34.6* 32.0*  PLT 309 333   Recent Labs    09/05/20 0250 09/06/20 0246  NA 138 136  K 4.2 3.9  CL 98 96*  CO2 33* 33*  BUN 17 17  CREATININE 1.01* 0.92  GLUCOSE 119* 116*  CALCIUM 9.3 9.4   No results for input(s): LABPT, INR in the last 72 hours.  Right hand dressing clean dry and intact. Decreased edema right wrist. Right ring finger swelling at PIP joint.  Right knee positive effusion no abnormal warmth or erythema.    Assessment/Plan: 6 Days Post-Op Procedure(s) (LRB): TYNOSYNOVECTOMY (Right) Right knee prepped with Betadine.  3 cc of 1% plain lidocaine was used to anesthetize the knee.  Then 50 cc of yellow slightly turbid synovial fluid was obtained.  1 cc of lidocaine and 40 mg methylPREDNISolone acetate 40 MG/ML was injected into the knee patient tolerated well.  Recommend Voltaren gel to the right hand index finger PIP joint 2 g up to 4 times daily.  We will send aspirate from the knee off for cell count and crystals.  Patient will follow up with [place] in the office 2 weeks status post tenosynovectomy right wrist.      Anu Stagner 09/07/2020, 12:07 PM

## 2020-09-07 NOTE — TOC Transition Note (Signed)
Transition of Care (TOC) - CM/SW Discharge Note   Patient Details  Name: Lori Orozco MRN: [id number] Date of Birth: 07/03/1950  Transition of Care (TOC) CM/SW Contact:   F , LCSWA Phone Number: 09/07/2020, 1:00 PM   Clinical Narrative:    CSW met with patient at bedside to present bed offers.  Patient reviewed the medicare choice list and choose [facility].  CSW called and spoke with Kitty at [facility] and the facility is able to accept the patient today. Patient notified.    Patient will DC to: [facility] Anticipated DC date: 09/07/2020 Transport by: [facility]   Per MD patient ready for DC to SNF. RN to call report prior to discharge [phone]. RN, patient, and facility notified of DC. Discharge Summary and FL2 sent to facility. DC packet on chart. Ambulance transport will be requested for patient when RN reports that patient is ready.   CSW will sign off for now as social work intervention is no longer needed. Please consult us again if new needs arise.     Final next level of care: Skilled Nursing Facility Barriers to Discharge: No Barriers Identified, Barriers Resolved   Patient Goals and CMS Choice Patient states their goals for this hospitalization and ongoing recovery are:: To go home if possible CMS Medicare.gov Compare Post Acute Care list provided to:: Patient Choice offered to / list presented to : Patient  Discharge Placement              Patient chooses bed at: [facility] Patient to be transferred to facility by: [facility] Name of family member notified: Patient alert Patient and family notified of of transfer: 09/07/20  Discharge Plan and Services                                     Social Determinants of Health (SDOH) Interventions     Readmission Risk Interventions No flowsheet data found.     

## 2020-09-07 NOTE — Progress Notes (Signed)
Physical Therapy Treatment Patient Details Name: Lori Orozco MRN: [id number] DOB: 10-22-1950 Today's Date: 09/07/2020    History of Present Illness [name] admitted with severe Rt wrist pain.  MRI showed findings concerning for inflammatory arthropathy without direct clear-cut evidence of septic arthritis.  She underwent Rt tynosynovectomy on 7/8.  PMH includes: amaurosis Fugax, anxiety, chronic fatigue syndrome, CKD, COPD, COVID, Emphysema, Fibromyalgia, IBS, Linchen sclerosus, Migraines, vertigo, dexa - osteopenia, s/p bil. TKA, s/p Lt TSA    PT Comments    Pt was seen for last PT visit prior to dc, with pt demonstrating a tolerance for WB on R UE on platform attachment.  Her plan is to continue to increase control of walker and LE strength to reduce fall risk, to increase safety with more independent gait.  Follow for acute PT goals.   Follow Up Recommendations  SNF     Equipment Recommendations  None recommended by PT    Recommendations for Other Services       Precautions / Restrictions Precautions Precautions: Fall Precaution Comments: use care with RUE wrist and hand pain Restrictions Weight Bearing Restrictions: No    Mobility  Bed Mobility Overal bed mobility: Needs Assistance Bed Mobility: Supine to Sit     Supine to sit: Min assist     General bed mobility comments: assisted to walk    Transfers Overall transfer level: Needs assistance Equipment used: 1 person hand held assist;Rolling walker (2 wheeled) Transfers: Sit to/from [employer] Sit to Stand: Min guard;Min assist Stand pivot transfers: Min assist          Ambulation/Gait Ambulation/Gait assistance: Min guard;Min assist Gait Distance (Feet): 60 Feet (30 x 2) Assistive device: 1 person hand held assist;Right platform walker Gait Pattern/deviations: Step-to pattern;Step-through pattern;Decreased stride length Gait velocity: reduced Gait velocity  interpretation: <1.8 ft/sec, indicate of risk for recurrent falls General Gait Details: Armed forces training and education officer Rankin (Stroke Patients Only)       Balance     Sitting balance-Leahy Scale: Good       Standing balance-Leahy Scale: Poor                              Cognition Arousal/Alertness: Awake/alert Behavior During Therapy: WFL for tasks assessed/performed Overall Cognitive Status: Within Functional Limits for tasks assessed                                        Exercises Other Exercises Other Exercises: palm to palm Other Exercises: scapular reposturing    General Comments General comments (skin integrity, edema, etc.): reduction in edema, shiny skin appearance and deficits      Pertinent Vitals/Pain Pain Assessment: Faces Faces Pain Scale: Hurts even more Pain Location: Rt hand and wrist Pain Descriptors / Indicators: Grimacing;Guarding;Sharp Pain Intervention(s): Limited activity within patient's tolerance;Monitored during session;Premedicated before session;Repositioned;Ice applied    Home Living                      Prior Function            PT Goals (current goals can now be found in the care plan section) Acute Rehab PT Goals Patient Stated Goal: to be able to get back  home Progress towards PT goals: Progressing toward goals    Frequency    Min 3X/week      PT Plan Discharge plan needs to be updated    Co-evaluation              AM-PAC PT "6 Clicks" Mobility   Outcome Measure  Help needed turning from your back to your side while in a flat bed without using bedrails?: A Little Help needed moving from lying on your back to sitting on the side of a flat bed without using bedrails?: A Lot Help needed moving to and from a bed to a chair (including a wheelchair)?: A Lot Help needed standing up from a chair using your arms (e.g., wheelchair or bedside  chair)?: A Lot Help needed to walk in hospital room?: Total Help needed climbing 3-5 steps with a railing? : Total 6 Click Score: 11    End of Session Equipment Utilized During Treatment: Gait belt Activity Tolerance: Patient limited by pain Patient left: in bed;with call bell/phone within reach;with bed alarm set Nurse Communication: Mobility status PT Visit Diagnosis: Muscle weakness (generalized) (M62.81);Unsteadiness on feet (R26.81);Pain Pain - Right/Left: Right Pain - part of body: Arm     Time: 7026-3785 PT Time Calculation (min) (ACUTE ONLY): 26 min  Charges:  $Gait Training: 8-22 mins $Therapeutic Activity: 8-22 mins               Ivar Drape 09/07/2020, 5:14 PM  Samul Dada, PT MS Acute Rehab Dept. Number: [facility] [phone] and [facility] [phone]

## 2020-09-07 NOTE — Plan of Care (Signed)

## 2020-09-07 NOTE — Progress Notes (Signed)
Occupational Therapy Treatment Note  Pt able to ambulate to bathroom to use regular toilet with min A for safety/sequencing use of platform RW. S with pericare. Pt beginning to use R hand as functional assist. Left with RUE in arm elevator and ice on dorsum of hand, encouraging pt to continue ROM exercises. Pt wants to DC home however at this time feel pt would benefit from post acute rehab. Will continue to follow.    09/07/20 0900  OT Visit Information  Last OT Received On 09/07/20  Assistance Needed +1  History of Present Illness [name] admitted with severe Rt wrist pain.  MRI showed findings concerning for inflammatory arthropathy without direct clear-cut evidence of septic arthritis.  She underwent Rt tynosynovectomy on 7/8.  PMH includes: amaurosis Fugax, anxiety, chronic fatigue syndrome, CKD, COPD, COVID, Emphysema, Fibromyalgia, IBS, Linchen sclerosus, Migraines, vertigo, dexa - osteopenia, s/p bil. TKA, s/p Lt TSA  Precautions  Precautions Fall  Pain Assessment  Pain Assessment Faces  Faces Pain Scale 8  Pain Location Rt hand and wrist  Pain Descriptors / Indicators Grimacing;Guarding;Sharp  Pain Intervention(s) Limited activity within patient's tolerance;Repositioned;Patient requesting pain meds-RN notified;Ice applied  Cognition  Arousal/Alertness Awake/alert  Behavior During Therapy WFL for tasks assessed/performed  Overall Cognitive Status Within Functional Limits for tasks assessed  ADL  Toilet Transfer Minimal assistance;Grab bars;Ambulation;Comfort height toilet;Cueing for safety (R platform RW)  Toileting- Clothing Manipulation and Hygiene Supervision/safety;Sit to/from stand;Sitting/lateral lean  Functional mobility during ADLs Minimal assistance;Cueing for safety;Cueing for sequencing;Rolling walker (R platform)  General ADL Comments using R hand to hold toilet papaer and beginning to use as a functional assist  Balance  Sitting balance-Leahy Scale Good   Standing balance-Leahy Scale Poor  Transfers  Overall transfer level Needs assistance  Transfers Sit to/from Stand  Sit to Stand Min assist  OT - End of Session  Equipment Utilized During Treatment Other (comment);Gait belt (R platform RW)  Activity Tolerance Patient tolerated treatment well  Patient left in chair;with call bell/phone within reach  Nurse Communication Mobility status;Patient requests pain meds  OT Assessment/Plan  OT Plan Discharge plan remains appropriate  OT Visit Diagnosis Pain;Muscle weakness (generalized) (M62.81);Unsteadiness on feet (R26.81)  Pain - Right/Left Right  Pain - part of body Hand;Arm  OT Frequency (ACUTE ONLY) Min 3X/week  Recommendations for Other Services PT consult  Follow Up Recommendations Outpatient OT;SNF (pending progress)  OT Equipment 3 in 1 bedside commode  AM-PAC OT "6 Clicks" Daily Activity Outcome Measure (Version 2)  Help from another person eating meals? 3  Help from another person taking care of personal grooming? 3  Help from another person toileting, which includes using toliet, bedpan, or urinal? 3  Help from another person bathing (including washing, rinsing, drying)? 2  Help from another person to put on and taking off regular upper body clothing? 2  Help from another person to put on and taking off regular lower body clothing? 2  6 Click Score 15  OT Goal Progression  Progress towards OT goals Progressing toward goals  Acute Rehab OT Goals  Patient Stated Goal to be able to get back home  OT Goal Formulation With patient  Time For Goal Achievement 09/17/20  Potential to Achieve Goals Good  ADL Goals  Pt Will Perform Upper Body Bathing with set-up;sitting  Pt Will Perform Upper Body Dressing with set-up;sitting  Pt/caregiver will Perform Home Exercise Program Right Upper extremity;With written HEP provided;Increased ROM;Independently  Additional ADL Goal #1 Pt will independently  verbalize 3 strategies to reduce edema   OT Time Calculation  OT Start Time (ACUTE ONLY) 0904  OT Stop Time (ACUTE ONLY) 0933  OT Time Calculation (min) 29 min  OT General Charges  $OT Visit 1 Visit  OT Treatments  $Self Care/Home Management  23-37 mins  .hil

## 2020-09-07 NOTE — Care Management Important Message (Signed)
Important Message  Patient Details  Name: Lori Orozco MRN: [id number] Date of Birth: 10/03/1950   Medicare Important Message Given:  Yes - Important Message mailed due to current National Emergency   Verbal consent obtained due to current National Emergency  Relationship to patient: Self Contact Name: Emylee Call Date: 09/07/20  Time: 1359 Phone: [phone] Outcome: Spoke with contact Important Message mailed to: Patient address on file    Orson Aloe 09/07/2020, 1:59 PM

## 2020-09-07 NOTE — Progress Notes (Signed)
Occupational Therapy Treatment Patient Details Name: Lori Orozco MRN: [id number] DOB: 25-Nov-1950 Today's Date: 09/07/2020    History of present illness [name] admitted with severe Rt wrist pain.  MRI showed findings concerning for inflammatory arthropathy without direct clear-cut evidence of septic arthritis.  She underwent Rt tynosynovectomy on 7/8.  PMH includes: amaurosis Fugax, anxiety, chronic fatigue syndrome, CKD, COPD, COVID, Emphysema, Fibromyalgia, IBS, Linchen sclerosus, Migraines, vertigo, dexa - osteopenia, s/p bil. TKA, s/p Lt TSA   OT comments  Significant improvement with R hand/wrist ROM. Pt tolerating ROM and touch with improved tolerance. Completing P/AA/AAROM and place and hold exercises to increase ROM. Pt states her R ring finger and wrist are most painful. Encouraged to keep RUE elevated and use ice as tolerated. Pt wants to walk into bathroom. Will return to assess with R platform RW to increase functional mobility.   Follow Up Recommendations  Outpatient OT;Supervision/Assistance - 24 hour;SNF (pending progress)    Equipment Recommendations  3 in 1 bedside commode (RW with R platform)    Recommendations for Other Services PT consult    Precautions / Restrictions Precautions Precautions: Fall Restrictions Weight Bearing Restrictions: No       Mobility Bed Mobility               General bed mobility comments: OOB in chair    Transfers                      Balance     Sitting balance-Leahy Scale: Good                                     ADL either performed or assessed with clinical judgement   ADL                                         General ADL Comments: focus of session on RUE rehab     Vision       Perception     Praxis      Cognition Arousal/Alertness: Awake/alert Behavior During Therapy: WFL for tasks assessed/performed Overall Cognitive Status: Within  Functional Limits for tasks assessed                                          Exercises General Exercises - Upper Extremity Shoulder Flexion: AROM;Right;10 reps;Supine Elbow Flexion: AROM;Right;10 reps Elbow Extension: AROM;Right;10 reps Wrist Extension: AROM;AAROM;Right;10 reps;Seated Digit Composite Flexion: PROM;AROM;AAROM;Right;20 reps;Squeeze ball Composite Extension: AROM;AAROM;15 reps;Seated Hand Exercises Forearm Supination: AROM;Right;10 reps Forearm Pronation: AROM;Right;10 reps;Seated Digit Composite Abduction: AROM;Right;10 reps Digit Composite Adduction: AROM;Right;10 reps Opposition: AROM;AAROM;Right;10 reps Other Exercises Other Exercises: coban removed. tolerated well. significant decrease in edema Other Exercises: pt cleaning hand, rubbing lotion on hand for ROM and desensitizationa   Shoulder Instructions       General Comments Significant improvement ni Rhand/wrist ROM. composite flexion @ 1/2 from palm index and middle adn 1 inch from palm ring and little    Pertinent Vitals/ Pain       Pain Assessment: 0-10 Pain Score: 10-Worst pain ever Pain Location: Rt hand and wrist Pain Descriptors / Indicators: Grimacing;Guarding;Sharp Pain Intervention(s): Limited activity within patient's tolerance  Home Living  Prior Functioning/Environment              Frequency  Min 3X/week        Progress Toward Goals  OT Goals(current goals can now be found in the care plan section)  Progress towards OT goals: Progressing toward goals  Acute Rehab OT Goals Patient Stated Goal: to be able to get back home OT Goal Formulation: With patient Time For Goal Achievement: 09/17/20 Potential to Achieve Goals: Good ADL Goals Pt Will Perform Upper Body Bathing: with set-up;sitting Pt Will Perform Upper Body Dressing: with set-up;sitting Pt/caregiver will Perform Home Exercise Program: Right  Upper extremity;With written HEP provided;Increased ROM;Independently Additional ADL Goal #1: Pt will independently verbalize 3 strategies to reduce edema  Plan Discharge plan remains appropriate    Co-evaluation                 AM-PAC OT "6 Clicks" Daily Activity     Outcome Measure   Help from another person eating meals?: A Little Help from another person taking care of personal grooming?: A Little Help from another person toileting, which includes using toliet, bedpan, or urinal?: A Little Help from another person bathing (including washing, rinsing, drying)?: A Lot Help from another person to put on and taking off regular upper body clothing?: A Lot Help from another person to put on and taking off regular lower body clothing?: A Lot 6 Click Score: 15    End of Session    OT Visit Diagnosis: Pain;Muscle weakness (generalized) (M62.81);Unsteadiness on feet (R26.81) Pain - Right/Left: Right Pain - part of body: Hand;Arm   Activity Tolerance Patient tolerated treatment well   Patient Left in chair;with call bell/phone within reach   Nurse Communication Weight bearing status;Precautions;Mobility status;Patient requests pain meds        Time: 9935-7017 OT Time Calculation (min): 30 min  Charges: OT General Charges $OT Visit: 1 Visit OT Treatments $Therapeutic Activity: 8-22 mins $Therapeutic Exercise: 8-22 mins  Luisa Dago, OT/L   Acute OT Clinical Specialist Acute Rehabilitation Services Pager [phone] Office [phone]    [facility] 09/07/2020, 9:34 AM

## 2020-09-07 NOTE — Progress Notes (Signed)
[facility] transported pt to [facility]Pt alert & oriented x4; vital signs stable and is no respiratory distress.

## 2020-09-08 ENCOUNTER — Other Ambulatory Visit: Payer: Self-pay | Admitting: Physician Assistant

## 2020-09-08 ENCOUNTER — Encounter: Payer: Self-pay | Admitting: Adult Health

## 2020-09-08 ENCOUNTER — Telehealth: Payer: Self-pay | Admitting: Orthopaedic Surgery

## 2020-09-08 ENCOUNTER — Non-Acute Institutional Stay (SKILLED_NURSING_FACILITY): Payer: Medicare Other | Admitting: Adult Health

## 2020-09-08 DIAGNOSIS — M25561 Pain in right knee: Secondary | ICD-10-CM | POA: Diagnosis not present

## 2020-09-08 DIAGNOSIS — M25531 Pain in right wrist: Secondary | ICD-10-CM

## 2020-09-08 DIAGNOSIS — J449 Chronic obstructive pulmonary disease, unspecified: Secondary | ICD-10-CM | POA: Diagnosis not present

## 2020-09-08 DIAGNOSIS — E7800 Pure hypercholesterolemia, unspecified: Secondary | ICD-10-CM

## 2020-09-08 DIAGNOSIS — I10 Essential (primary) hypertension: Secondary | ICD-10-CM

## 2020-09-08 DIAGNOSIS — N3281 Overactive bladder: Secondary | ICD-10-CM

## 2020-09-08 DIAGNOSIS — F5101 Primary insomnia: Secondary | ICD-10-CM

## 2020-09-08 DIAGNOSIS — F319 Bipolar disorder, unspecified: Secondary | ICD-10-CM

## 2020-09-08 DIAGNOSIS — E039 Hypothyroidism, unspecified: Secondary | ICD-10-CM

## 2020-09-08 MED ORDER — OXYCODONE HCL 5 MG PO TABS: 5.0000 mg | ORAL_TABLET | Freq: Four times a day (QID) | ORAL | 0 refills | Status: DC | PRN

## 2020-09-08 NOTE — Telephone Encounter (Signed)
Can you help with this?

## 2020-09-08 NOTE — Telephone Encounter (Signed)
RNCM call to [facility] liaison and made referral for PT/OT for right wrist/finger ROM. Gave orders for PT/OT per Rexene Edison, PA-C. They have accepted referral and will be in contact with patient. RNCM spoke with patient and updated her that HHPT and OT have been ordered and someone from their office will be in contact. Confirmed home address. She is still in [facility], but states she will be leaving soon.

## 2020-09-08 NOTE — Telephone Encounter (Signed)
Pt's emergency contact Amedeo Plenty) came in asking to speak with [facility], or one of their nurses in reference to the pt. She states pt had the surgery and the facility she was moved to has denied medications to where her morning medications have yet to be given to her at this time, as well as other situations where pt does not feel cared for. Pt was calling her emergency contact at midnight last night crying asking for her to come get her and sign her out as emergency contact checked her in. Emergency contact just wanted to ask questions about how to care for pt if she does go sign her out from the facility post surgery. The best call back number is [phone].

## 2020-09-08 NOTE — Progress Notes (Signed)
Location:  [facility] Room Number: 216 A Place of Service:  SNF (31) Provider:  Kenard Gower, DNP, FNP-BC  Patient Care Team: Tower, Audrie Gallus, MD as PCP - Lorelle Gibbs, MD as Consulting Physician (Ophthalmology) Phillip Heal, MD as Referring Physician (Psychiatry) Allie Bossier, MD (Inactive) as Consulting Physician (Obstetrics and Gynecology) Waymon Budge, MD as Consulting Physician (Pulmonary Disease) [name], MD as Consulting Physician (Orthopedic Surgery) [name], MD as Consulting Physician (Orthopedic Surgery) Tyrell Antonio, MD as Consulting Physician (Physical Medicine and Rehabilitation) Ronalee Belts, MD as Referring Physician (Otolaryngology) Doran Heater, PhD as Referring Physician (Psychology)  Extended Emergency Contact Information Primary Emergency Contact: Bosie Helper, [place] [address] [name] of [place] Home Phone: [phone] Relation: Sister Secondary Emergency Contact: Short,Karen Address: [address]           [place], [place] [address] [name] of [place] Home Phone: [phone] Mobile Phone: [phone] Relation: Roommate  Code Status:  FULL CODE  Goals of care: Advanced Directive information Advanced Directives 09/08/2020  Does Patient Have a Medical Advance Directive? No  Type of Advance Directive -  Does patient want to make changes to medical advance directive? -  Copy of Healthcare Power of Attorney in Chart? -  Would patient like information on creating a medical advance directive? No - Patient declined  Pre-existing out of facility DNR order (yellow form or pink MOST form) -     Chief Complaint  Patient presents with   Hospitalization Follow-up    Hospital follow up    HPI:  Pt is a 70 y.o. female seen today for medical management of chronic diseases.     Past Medical History:  Diagnosis Date   Amaurosis fugax    Anemia    hx    Anxiety    Arthritis    Asthma    Cataract    Chronic fatigue syndrome    Chronic kidney disease    frequency, nephritis when 70 yrs old   COPD (chronic obstructive pulmonary disease) (HCC)    COVID-19    Depression    Diverticulitis    Emphysema of lung (HCC)    Fibromyalgia    GERD (gastroesophageal reflux disease)    occ   H/O hiatal hernia    History of bronchitis    Hyperlipidemia    Hypothyroidism    Interstitial cystitis    Irritable bowel syndrome    Lichen sclerosus    Lumbar herniated disc    Migraines    Pneumonia    PONV (postoperative nausea and vomiting)    Shortness of breath    exertion    Thyroid disease    Graves   Urinary frequency    Urinary tract infection    Vertigo    Past Surgical History:  Procedure Laterality Date   ABDOMINAL HYSTERECTOMY     bladder sugery      CAROTID DOPPLAR     COLONOSCOPY  05/26/2010   avms- otherwise nl , re check 10y   DEXA-OSTEOPENIA     DOPPLER ECHOCARDIOGRAPHY     elbow surgery     EPICONDYLITIS     EYE SURGERY Bilateral    cataracts   FOOT SURGERY Bilateral    I & D EXTREMITY Right 09/01/2020   Procedure: TYNOSYNOVECTOMY;  Surgeon: [name], MD;  Location: [facility] OR;  Service: Orthopedics;  Laterality: Right;   KNEE SURGERY  Left cartilage   lipoma in second finger right hand     PLANTAR FASCIA SURGERY Left    REVERSE SHOULDER ARTHROPLASTY Left 02/02/2020   Procedure: LEFT REVERSE SHOULDER ARTHROPLASTY;  Surgeon: [name], MD;  Location: [facility] OR;  Service: Orthopedics;  Laterality: Left;   ROTATOR CUFF REPAIR Left 12/2017   TEMPOROMANDIBULAR JOINT SURGERY     TONSILLECTOMY     TOTAL HIP ARTHROPLASTY Right 03/20/2013   Procedure: Right TOTAL HIP ARTHROPLASTY;  Surgeon: [name], MD;  Location: [facility] OR;  Service: Orthopedics;  Laterality: Right;  Right Total Hip Arthroplasty   TOTAL KNEE ARTHROPLASTY Left 01/28/2015   Procedure: LEFT TOTAL KNEE ARTHROPLASTY;  Surgeon: [name], MD;  Location: [facility] ORS;  Service: Orthopedics;  Laterality: Left;   TRIGGER FINGER RELEASE Right    TROCHANTERIC BURSA EXCISION Right 05/03/2016   Dr. Doneen Poisson   WRIST ARTHROSCOPY Right    ligament tear    Allergies  Allergen Reactions   Lithium Anaphylaxis   Tegretol [Carbamazepine] Other (See Comments)    Fever and body aches (fever over 103)   Tricyclic Antidepressants Anaphylaxis   Strawberry Extract Hives   Codeine Nausea Only    Makes pt stay awake   Cymbalta [Duloxetine Hcl] Other (See Comments)    Makes pt pass out    Erythromycin     abdominal pain   Lyrica [Pregabalin]     Felt faint   Neurontin [Gabapentin]     Passes  out   Rabeprazole Sodium     insomnia   Duraprep [Antiseptic Products, Misc.] Itching and Rash    Tolerates Betadine    Penicillins Rash    Tolerated ANCEF 02/02/20  Has patient had a PCN reaction causing immediate rash, facial/tongue/throat swelling, SOB or lightheadedness with hypotension: Yes Has patient had a PCN reaction causing severe rash involving mucus membranes or skin necrosis: No Has patient had a PCN reaction that required hospitalization No Has patient had a PCN reaction occurring within the last 10 years: No If all of the above answers are "NO", then may proceed with Cephalosporin use.    Tape Rash    PT ALLERGIC NYLON TAPE     Outpatient Encounter Medications as of 09/08/2020  Medication Sig   albuterol (VENTOLIN HFA) 108 (90 Base) MCG/ACT inhaler Inhale 2 puffs into the lungs every 6 (six) hours as needed for wheezing or shortness of breath.   bisacodyl (DULCOLAX) 10 MG suppository Place 10 mg rectally as needed for moderate constipation. If not relieved by MOM, give 10 mg Bisacodyl suppositiory rectally   buPROPion (WELLBUTRIN XL) 150 MG 24 hr tablet Take 150 mg by mouth daily.   busPIRone (BUSPAR) 15 MG tablet Take 15 mg by mouth 2 (two) times daily before a meal.   cholecalciferol (VITAMIN D) 25 MCG  (1000 UNIT) tablet Take 2,000 Units by mouth daily. 2 tablets daily   clobetasol cream (TEMOVATE) 0.05 % APPLY SMALL AMOUNT TO AFFECTED AREAS TWO TO THREE TIMES PER WEEK (Patient taking differently: Apply 1 application topically. APPLY SMALL AMOUNT TO AFFECTED AREAS TWO TO THREE TIMES PER WEEK)   cyanocobalamin 2000 MCG tablet Take 1,000 mcg by mouth daily.   diclofenac Sodium (VOLTAREN) 1 % GEL Apply 2 g topically 2 (two) times daily as needed (pain).   divalproex (DEPAKOTE ER) 500 MG 24 hr tablet Take 500 mg by mouth at bedtime.    doxycycline (VIBRA-TABS) 100 MG tablet Take 1 tablet (100 mg total) by  mouth 2 (two) times daily for 14 days.   fluticasone (FLONASE) 50 MCG/ACT nasal spray PLACE 2 SPRAYS INTO THE NOSE DAILY. (Patient taking differently: Place 2 sprays into both nostrils. PLACE 2 SPRAYS INTO THE NOSE DAILY.)   ibuprofen (ADVIL) 200 MG tablet Take 200 mg by mouth every 6 (six) hours as needed for moderate pain.   ipratropium-albuterol (DUONEB) 0.5-2.5 (3) MG/3ML SOLN Take 3 mLs by nebulization every 6 (six) hours as needed. (Patient taking differently: Take 3 mLs by nebulization every 6 (six) hours as needed (sob/wheezing).)   ketotifen (ZADITOR) 0.025 % ophthalmic solution Place 1 drop into both eyes 2 (two) times daily as needed (allergies).   levothyroxine (SYNTHROID) 50 MCG tablet TAKE ONE TABLET BY MOUTH DAILY BEFORE BREAKFAST (Patient taking differently: Take 50 mcg by mouth at bedtime.)   LORazepam (ATIVAN) 1 MG tablet Take 1 mg by mouth at bedtime as needed for anxiety or sleep.   Magnesium Hydroxide (MILK OF MAGNESIA PO) Take by mouth. If no BM in 3 days, give 30 cc Milk of Magnesium p.o. x 1 dose in 24 hours as needed   metoprolol tartrate (LOPRESSOR) 25 MG tablet Take 0.5 tablets (12.5 mg total) by mouth 2 (two) times daily.   mirabegron ER (MYRBETRIQ) 25 MG TB24 tablet Take 1 tablet (25 mg total) by mouth daily.   mirtazapine (REMERON) 30 MG tablet Take 30 mg by mouth at  bedtime.    ondansetron (ZOFRAN-ODT) 4 MG disintegrating tablet Take 4 mg by mouth every 8 (eight) hours as needed for nausea or vomiting.   oxyCODONE (OXY IR/ROXICODONE) 5 MG immediate release tablet Take 5 mg by mouth every 6 (six) hours as needed for severe pain.   predniSONE (DELTASONE) 50 MG tablet Take 1 tablet (50 mg total) by mouth daily with breakfast.   PREMARIN 0.625 MG tablet Take 1 tablet (0.625 mg total) by mouth daily. Take one tablet daily   sertraline (ZOLOFT) 100 MG tablet Take 100 mg by mouth daily.    simvastatin (ZOCOR) 20 MG tablet TAKE ONE TABLET BY MOUTH EVERY NIGHT AT BEDTIME   Sodium Phosphates (RA SALINE ENEMA RE) Place rectally. If not relieved by Biscodyl suppository, give disposable Saline Enema rectally   diphenhydrAMINE (BENADRYL) 25 MG tablet Take 25 mg by mouth every 6 (six) hours as needed for allergies.   [DISCONTINUED] oxyCODONE (OXY IR/ROXICODONE) 5 MG immediate release tablet Take 1-2 tablets (5-10 mg total) by mouth every 6 (six) hours as needed for severe pain. (Patient taking differently: Take 5 mg by mouth every 6 (six) hours as needed for severe pain.)   No facility-administered encounter medications on file as of 09/08/2020.    Review of Systems  GENERAL: No change in appetite, no fatigue, no weight changes, no fever, chills or weakness SKIN: Denies rash, itching, wounds, ulcer sores, or nail abnormalities EYES: Denies change in vision, dry eyes, eye pain, itching or discharge EARS: Denies change in hearing, ringing in ears, or earache NOSE: Denies nasal congestion or epistaxis MOUTH and THROAT: Denies oral discomfort, gingival pain or bleeding, pain from teeth or hoarseness   RESPIRATORY: no cough, SOB, DOE, wheezing, hemoptysis CARDIAC: No chest pain, edema or palpitations GI: No abdominal pain, diarrhea, constipation, heart burn, nausea or vomiting GU: Denies dysuria, frequency, hematuria, incontinence, or discharge MUSCULOSKELETAL: Denies  joint pain, muscle pain, back pain, restricted movement, or unusual weakness CIRCULATION: Denies claudication, edema of legs, varicosities, or cold extremities NEUROLOGICAL: Denies dizziness, syncope, numbness, or headache PSYCHIATRIC: Denies  feelings of depression or anxiety. No report of hallucinations, insomnia, paranoia, or agitation ENDOCRINE: Denies polyphagia, polyuria, polydipsia, heat or cold intolerance HEME/LYMPH: Denies excessive bruising, petechia, enlarged lymph nodes, or bleeding problems IMMUNOLOGIC: Denies history of frequent infections, AIDS, or use of immunosuppressive agents   Immunization History  Administered Date(s) Administered   Influenza Split 11/19/2011, 11/02/2013   Influenza Whole 12/20/2006, 11/25/2008, 11/26/2009, 11/24/2010   Influenza, High Dose Seasonal PF 12/13/2016   Influenza,inj,Quad PF,6+ Mos 12/06/2015   Influenza-Unspecified 12/08/2012, 12/03/2014, 12/08/2017, 11/10/2018, 10/31/2019   Moderna Sars-Covid-2 Vaccination 06/21/2019, 07/19/2019, 01/22/2020   PFIZER(Purple Top)SARS-COV-2 Vaccination 06/04/2020   Pneumococcal Conjugate-13 12/06/2015   Pneumococcal Polysaccharide-23 01/26/2002, 03/23/2008, 09/07/2010, 06/28/2017   Td 06/26/2000, 10/19/2011   Zoster Recombinat (Shingrix) 09/16/2017   Pertinent  Health Maintenance Due  Topic Date Due   COLONOSCOPY (Pts 45-7yrs Insurance coverage will need to be confirmed)  05/25/2020   MAMMOGRAM  08/25/2020   INFLUENZA VACCINE  09/26/2020   DEXA SCAN  Completed   PNA vac Low Risk Adult  Completed   Fall Risk  07/08/2020 07/03/2019 06/30/2018 06/18/2017 05/09/2016  Falls in the past year? 1 0 0 No Yes  Comment - - - - pt fell backwards over stacked bricks that were in yard; bruise to right hip  Number falls in past yr: 0 0 - - 1  Injury with Fall? 0 - - - Yes  Risk Factor Category  - - - - -  Comment - - - - -  Follow up Falls evaluation completed Falls evaluation completed - - -     Vitals:    09/08/20 1437  BP: (!) 156/79  Pulse: 75  Resp: 18  Temp: 97.9 F (36.6 C)  Weight: 195 lb 12.8 oz (88.8 kg)  Height: 5\' 6"  (1.676 m)   Body mass index is 31.6 kg/m.  Physical Exam  GENERAL APPEARANCE: Well nourished. In no acute distress. Normal body habitus SKIN:  Skin is warm and dry. There are no suspicious lesions or rash HEAD: Normal in size and contour. No evidence of trauma EYES: Lids open and close normally. No blepharitis, entropion or ectropion. PERRL. Conjunctivae are clear and sclerae are white. Lenses are without opacity EARS: Pinnae are normal. Patient hears normal voice tunes of the examiner MOUTH and THROAT: Lips are without lesions. Oral mucosa is moist and without lesions. Tongue is normal in shape, size, and color and without lesions NECK: supple, trachea midline, no neck masses, no thyroid tenderness, no thyromegaly LYMPHATICS: No LAN in the neck, no supraclavicular LAN RESPIRATORY: Breathing is even & unlabored, BS CTAB CARDIAC: RRR, no murmur,no extra heart sounds, no edema GI: Abdomen soft, normal BS, no masses, no tenderness, no hepatomegaly, no splenomegaly MUSCULOSKELETAL: No deformities. Movement at each extremity is full and painless. Strength is 5/5 at each extremity. Back is without kyphosis or scoliosis CIRCULATION: Pedal pulses are 2+. There is no edema of the legs, ankles and feet NEUROLOGICAL: There is no tremor. Speech is clear PSYCHIATRIC: Alert and oriented X 3. Affect and behavior are appropriate  Labs reviewed: Recent Labs    09/04/20 0405 09/05/20 0250 09/06/20 0246  NA 136 138 136  K 3.9 4.2 3.9  CL 96* 98 96*  CO2 35* 33* 33*  GLUCOSE 111* 119* 116*  BUN 21 17 17   CREATININE 0.86 1.01* 0.92  CALCIUM 9.1 9.3 9.4   Recent Labs    08/28/20 2033 08/29/20 0412 09/05/20 0250  AST 16 11* 23  ALT 14 13 18  ALKPHOS 63 59 68  BILITOT 0.4 0.6 0.4  PROT 6.4* 5.8* 5.6*  ALBUMIN 2.9* 2.7* 2.2*   Recent Labs    07/08/20 0911  08/28/20 2033 08/29/20 0412 09/02/20 0334 09/04/20 0405 09/05/20 0250 09/06/20 0246  WBC 4.6 7.8 10.4   < > 9.1 8.4 8.5  NEUTROABS 1.9 6.3 6.7  --   --   --   --   HGB 12.3 11.5* 10.8*   < > 9.7* 10.7* 10.2*  HCT 37.3 35.4* 33.3*   < > 30.6* 34.6* 32.0*  MCV 94.8 97.0 97.9   < > 98.1 98.6 97.3  PLT 208.0 327 306   < > 323 309 333   < > = values in this interval not displayed.   Lab Results  Component Value Date   TSH 1.584 09/04/2020   Lab Results  Component Value Date   HGBA1C 5.5 08/29/2020   Lab Results  Component Value Date   CHOL 192 07/08/2020   HDL 70.30 07/08/2020   LDLCALC 101 (H) 07/08/2020   TRIG 101.0 07/08/2020   CHOLHDL 3 07/08/2020    Significant Diagnostic Results in last 30 days:  DG Wrist Complete Right  Result Date: 08/28/2020 CLINICAL DATA:  Pain and swelling. Limited range of motion without pain. EXAM: RIGHT WRIST - COMPLETE 3+ VIEW COMPARISON:  08/18/2020 FINDINGS: There is no acute fracture. There is ulnar minus variance. There is well corticated fragmentation of the ulnar styloid region, consistent with remote injury. Degenerative changes are identified at the first carpometacarpal joint and radiocarpal joint. Degenerative changes of the interphalangeal joint of the thumb. IMPRESSION: Degenerative changes.  No evidence for acute  abnormality. Electronically Signed   By: Norva Pavlov M.D.   On: 08/28/2020 15:07   MR WRIST RIGHT WO CONTRAST  Result Date: 08/30/2020 CLINICAL DATA:  Progressive pain over the last month, possible soft tissue infection EXAM: MR OF THE RIGHT WRIST WITHOUT CONTRAST TECHNIQUE: Multiplanar, multisequence MR imaging of the right wrist was performed. No intravenous contrast was administered. COMPARISON:  Radiographs dated 08/28/2020 FINDINGS: Despite efforts by the technologist and patient, motion artifact is present on today's exam and could not be eliminated. This reduces exam sensitivity and specificity. Ligaments: Unremarkable  Triangular fibrocartilage: Poor conspicuity of the distal TFCC for example on image 18 series 6, this may be attaching to the small ossicles above the remaining ulnar styloid which appear to sit just below the meniscal homologue. Tendons: Extensor carpi ulnaris peritendinitis or tenosynovitis. Carpal tunnel/median nerve: Unremarkable Guyon's canal: Unremarkable Joint/cartilage: Small effusion of the distal radioulnar joint. Possible synovitis. Radiocarpal joint effusion with indistinct margins favoring synovitis. Bones/carpal alignment: 3 mm negative ulnar variance. No lunateomalacia. Mild degenerative spurring at the first carpometacarpal articulation. Low-level periarticular edema at the second, third, and fourth carpometacarpal articulations. Small foci of edema scattered in the carpus. Other: Substantial subcutaneous edema dorsally in the wrist, extending medially and laterally as well. IMPRESSION: 1. Effusion and synovitis in the radiocarpal joint and also in the distal radioulnar joint. This may be secondary to inflammatory arthropathy, infection is not excluded. 2. Dorsal subcutaneous edema along the wrist, cellulitis is not excluded. 3. Poor definition of the distal TFCC cartilage in the vicinity of the small ossicles along the ulnar styloid tip, query remote injury. 4. Extensor carpi ulnaris peritendinitis or tenosynovitis. 5. Subtle areas of subcortical marrow edema along the second, third, and fourth carpometacarpal articulations attributable to arthropathy. Electronically Signed   By: Gaylyn Rong M.D.   On: 08/30/2020  11:40   XR Wrist Complete Right  Result Date: 08/18/2020 Right wrist 3 views: No acute fractures or acute findings.  Wrist well located.  Heterotopic bone seen in the ulnar gutter region.  Otherwise no bony abnormalities.   Assessment/Plan    Family/ staff Communication:   Labs/tests ordered:    Goals of care:      Kenard Gower, DNP, MSN, FNP-BC [facility] [phone] (Monday-Friday 8:00 a.m. - 5:00 p.m.) [phone] (after hours)

## 2020-09-08 NOTE — Progress Notes (Signed)
Location:  [facility] Room Number: 216 A Place of Service:  SNF (31) Provider:  Kenard Gower, DNP, FNP-BC  Patient Care Team: Tower, Audrie Gallus, MD as PCP - Lorelle Gibbs, MD as Consulting Physician (Ophthalmology) Phillip Heal, MD as Referring Physician (Psychiatry) Allie Bossier, MD (Inactive) as Consulting Physician (Obstetrics and Gynecology) Waymon Budge, MD as Consulting Physician (Pulmonary Disease) [name], MD as Consulting Physician (Orthopedic Surgery) [name], MD as Consulting Physician (Orthopedic Surgery) Tyrell Antonio, MD as Consulting Physician (Physical Medicine and Rehabilitation) Ronalee Belts, MD as Referring Physician (Otolaryngology) Doran Heater, PhD as Referring Physician (Psychology)  Extended Emergency Contact Information Primary Emergency Contact: Bosie Helper, [place] [address] [name] of [place] Home Phone: [phone] Relation: Sister Secondary Emergency Contact: Short,Karen Address: [address]           [place], [place] [address] [name] of [place] Home Phone: [phone] Mobile Phone: [phone] Relation: Roommate  Code Status:    Goals of care: Advanced Directive information Advanced Directives 09/08/2020  Does Patient Have a Medical Advance Directive? No  Type of Advance Directive -  Does patient want to make changes to medical advance directive? -  Copy of Healthcare Power of Attorney in Chart? -  Would patient like information on creating a medical advance directive? No - Patient declined  Pre-existing out of facility DNR order (yellow form or pink MOST form) -     Chief Complaint  Patient presents with   Hospitalization Follow-up    Hospital follow up    HPI:  Pt is a 70 y.o. female who was admitted to [facility] on 09/07/20 post hospitalization  08/28/20 to 09/07/20. She has a PMH of hypothyroidism,  hyperlipidemia, generalized anxiety disorder and hypertension. She presented to [facility] ED with complaints of right wrist pain. Of note, orthopedic surgery saw her as an outpatient on 08/18/20. X-ray was found unremarkable. Right wrist was placed in a splint and was placed in a splint and a course of Colchicine. Patient's symptoms did not improved. She was seen again by orthopedic surgery on 08/22/20. Joint aspiration was performed  but sample clotted and could not be interpreted. No organism could be identified on the gram stain. She was sent home on a course of Doxycycline and Prednisone. She presented again to Dr. [name] in orthopedic surgery clinic on 6/30 fo  repeat assessment. She eventually presented to [facility] ED for evaluation.   Orthopedic surgery was consulted.  Imaging showed no evidence of bony destruction.  MRI showed findings concerning for inflammatory arthropathy with out direct clear-cut evidence of septic arthritis.  She was sent to the OR on 09/01/2020 for right wrist dorsal compartment extensor tendon tenosynovectomy.  Rheumatology called because this was ruled out : ANA negative, CCP antibodies IgG 15 and RA 14.2.  She complained of pain on her right knee and aspiration done and was sent for cell count and crystals she will follow-up with orthopedic surgery in 2 weeks.   She was seen in the room today. Discussed medications with her. Right hand  noted to have 2+edema and slight erythema on wrist.   Past Medical History:  Diagnosis Date   Amaurosis fugax    Anemia    hx   Anxiety    Arthritis    Asthma    Cataract    Chronic fatigue syndrome    Chronic kidney  disease    frequency, nephritis when 70 yrs old   COPD (chronic obstructive pulmonary disease) (HCC)    COVID-19    Depression    Diverticulitis    Emphysema of lung (HCC)    Fibromyalgia    GERD (gastroesophageal reflux disease)    occ   H/O hiatal hernia    History of bronchitis     Hyperlipidemia    Hypothyroidism    Interstitial cystitis    Irritable bowel syndrome    Lichen sclerosus    Lumbar herniated disc    Migraines    Pneumonia    PONV (postoperative nausea and vomiting)    Shortness of breath    exertion    Thyroid disease    Graves   Urinary frequency    Urinary tract infection    Vertigo    Past Surgical History:  Procedure Laterality Date   ABDOMINAL HYSTERECTOMY     bladder sugery      CAROTID DOPPLAR     COLONOSCOPY  05/26/2010   avms- otherwise nl , re check 10y   DEXA-OSTEOPENIA     DOPPLER ECHOCARDIOGRAPHY     elbow surgery     EPICONDYLITIS     EYE SURGERY Bilateral    cataracts   FOOT SURGERY Bilateral    I & D EXTREMITY Right 09/01/2020   Procedure: TYNOSYNOVECTOMY;  Surgeon: [name], MD;  Location: [facility] OR;  Service: Orthopedics;  Laterality: Right;   KNEE SURGERY     Left cartilage   lipoma in second finger right hand     PLANTAR FASCIA SURGERY Left    REVERSE SHOULDER ARTHROPLASTY Left 02/02/2020   Procedure: LEFT REVERSE SHOULDER ARTHROPLASTY;  Surgeon: [name], MD;  Location: [facility] OR;  Service: Orthopedics;  Laterality: Left;   ROTATOR CUFF REPAIR Left 12/2017   TEMPOROMANDIBULAR JOINT SURGERY     TONSILLECTOMY     TOTAL HIP ARTHROPLASTY Right 03/20/2013   Procedure: Right TOTAL HIP ARTHROPLASTY;  Surgeon: [name], MD;  Location: [facility] OR;  Service: Orthopedics;  Laterality: Right;  Right Total Hip Arthroplasty   TOTAL KNEE ARTHROPLASTY Left 01/28/2015   Procedure: LEFT TOTAL KNEE ARTHROPLASTY;  Surgeon: [name], MD;  Location: [facility] ORS;  Service: Orthopedics;  Laterality: Left;   TRIGGER FINGER RELEASE Right    TROCHANTERIC BURSA EXCISION Right 05/03/2016   Dr. Doneen Poisson   WRIST ARTHROSCOPY Right    ligament tear    Allergies  Allergen Reactions   Lithium Anaphylaxis   Tegretol [Carbamazepine] Other (See Comments)    Fever and body aches (fever over 103)    Tricyclic Antidepressants Anaphylaxis   Strawberry Extract Hives   Codeine Nausea Only    Makes pt stay awake   Cymbalta [Duloxetine Hcl] Other (See Comments)    Makes pt pass out    Erythromycin     abdominal pain   Lyrica [Pregabalin]     Felt faint   Neurontin [Gabapentin]     Passes  out   Rabeprazole Sodium     insomnia   Duraprep [Antiseptic Products, Misc.] Itching and Rash    Tolerates Betadine    Penicillins Rash    Tolerated ANCEF 02/02/20  Has patient had a PCN reaction causing immediate rash, facial/tongue/throat swelling, SOB or lightheadedness with hypotension: Yes Has patient had a PCN reaction causing severe rash involving mucus membranes or skin necrosis: No Has patient had a PCN reaction that required hospitalization No Has patient had a  PCN reaction occurring within the last 10 years: No If all of the above answers are "NO", then may proceed with Cephalosporin use.    Tape Rash    PT ALLERGIC NYLON TAPE     Outpatient Encounter Medications as of 09/08/2020  Medication Sig   albuterol (VENTOLIN HFA) 108 (90 Base) MCG/ACT inhaler Inhale 2 puffs into the lungs every 6 (six) hours as needed for wheezing or shortness of breath.   bisacodyl (DULCOLAX) 10 MG suppository Place 10 mg rectally as needed for moderate constipation. If not relieved by MOM, give 10 mg Bisacodyl suppositiory rectally   buPROPion (WELLBUTRIN XL) 150 MG 24 hr tablet Take 150 mg by mouth daily.   busPIRone (BUSPAR) 15 MG tablet Take 15 mg by mouth 2 (two) times daily before a meal.   cholecalciferol (VITAMIN D) 25 MCG (1000 UNIT) tablet Take 2,000 Units by mouth daily. 2 tablets daily   clobetasol cream (TEMOVATE) 0.05 % APPLY SMALL AMOUNT TO AFFECTED AREAS TWO TO THREE TIMES PER WEEK (Patient taking differently: Apply 1 application topically. APPLY SMALL AMOUNT TO AFFECTED AREAS TWO TO THREE TIMES PER WEEK)   cyanocobalamin 2000 MCG tablet Take 1,000 mcg by mouth daily.   diclofenac Sodium  (VOLTAREN) 1 % GEL Apply 2 g topically 2 (two) times daily as needed (pain).   divalproex (DEPAKOTE ER) 500 MG 24 hr tablet Take 500 mg by mouth at bedtime.    doxycycline (VIBRA-TABS) 100 MG tablet Take 1 tablet (100 mg total) by mouth 2 (two) times daily for 14 days.   fluticasone (FLONASE) 50 MCG/ACT nasal spray PLACE 2 SPRAYS INTO THE NOSE DAILY. (Patient taking differently: Place 2 sprays into both nostrils. PLACE 2 SPRAYS INTO THE NOSE DAILY.)   ibuprofen (ADVIL) 200 MG tablet Take 200 mg by mouth every 6 (six) hours as needed for moderate pain.   ipratropium-albuterol (DUONEB) 0.5-2.5 (3) MG/3ML SOLN Take 3 mLs by nebulization every 6 (six) hours as needed. (Patient taking differently: Take 3 mLs by nebulization every 6 (six) hours as needed (sob/wheezing).)   ketotifen (ZADITOR) 0.025 % ophthalmic solution Place 1 drop into both eyes 2 (two) times daily as needed (allergies).   levothyroxine (SYNTHROID) 50 MCG tablet TAKE ONE TABLET BY MOUTH DAILY BEFORE BREAKFAST (Patient taking differently: Take 50 mcg by mouth at bedtime.)   LORazepam (ATIVAN) 1 MG tablet Take 1 mg by mouth at bedtime as needed for anxiety or sleep.   Magnesium Hydroxide (MILK OF MAGNESIA PO) Take by mouth. If no BM in 3 days, give 30 cc Milk of Magnesium p.o. x 1 dose in 24 hours as needed   metoprolol tartrate (LOPRESSOR) 25 MG tablet Take 0.5 tablets (12.5 mg total) by mouth 2 (two) times daily.   mirabegron ER (MYRBETRIQ) 25 MG TB24 tablet Take 1 tablet (25 mg total) by mouth daily.   mirtazapine (REMERON) 30 MG tablet Take 30 mg by mouth at bedtime.    ondansetron (ZOFRAN-ODT) 4 MG disintegrating tablet Take 4 mg by mouth every 8 (eight) hours as needed for nausea or vomiting.   oxyCODONE (OXY IR/ROXICODONE) 5 MG immediate release tablet Take 5 mg by mouth every 6 (six) hours as needed for severe pain.   predniSONE (DELTASONE) 50 MG tablet Take 1 tablet (50 mg total) by mouth daily with breakfast.   PREMARIN 0.625 MG  tablet Take 1 tablet (0.625 mg total) by mouth daily. Take one tablet daily   sertraline (ZOLOFT) 100 MG tablet Take 100 mg by  mouth daily.    simvastatin (ZOCOR) 20 MG tablet TAKE ONE TABLET BY MOUTH EVERY NIGHT AT BEDTIME   Sodium Phosphates (RA SALINE ENEMA RE) Place rectally. If not relieved by Biscodyl suppository, give disposable Saline Enema rectally   diphenhydrAMINE (BENADRYL) 25 MG tablet Take 25 mg by mouth every 6 (six) hours as needed for allergies.   [DISCONTINUED] oxyCODONE (OXY IR/ROXICODONE) 5 MG immediate release tablet Take 1-2 tablets (5-10 mg total) by mouth every 6 (six) hours as needed for severe pain. (Patient taking differently: Take 5 mg by mouth every 6 (six) hours as needed for severe pain.)   No facility-administered encounter medications on file as of 09/08/2020.    Review of Systems  GENERAL: No change in appetite, no fatigue, no weight changes, no fever or chills  MOUTH and THROAT: Denies oral discomfort, gingival pain or bleeding, RESPIRATORY: no cough, SOB, DOE, wheezing, hemoptysis CARDIAC: No chest pain or palpitations GI: No abdominal pain, diarrhea, constipation, heart burn, nausea or vomiting GU: Denies dysuria, hematuria, incontinence, or discharge NEUROLOGICAL: Denies dizziness, syncope or headache PSYCHIATRIC: Denies feelings of depression or anxiety. No report of hallucinations, insomnia, paranoia, or agitation   Immunization History  Administered Date(s) Administered   Influenza Split 11/19/2011, 11/02/2013   Influenza Whole 12/20/2006, 11/25/2008, 11/26/2009, 11/24/2010   Influenza, High Dose Seasonal PF 12/13/2016   Influenza,inj,Quad PF,6+ Mos 12/06/2015   Influenza-Unspecified 12/08/2012, 12/03/2014, 12/08/2017, 11/10/2018, 10/31/2019   Moderna Sars-Covid-2 Vaccination 06/21/2019, 07/19/2019, 01/22/2020   PFIZER(Purple Top)SARS-COV-2 Vaccination 06/04/2020   Pneumococcal Conjugate-13 12/06/2015   Pneumococcal Polysaccharide-23  01/26/2002, 03/23/2008, 09/07/2010, 06/28/2017   Td 06/26/2000, 10/19/2011   Zoster Recombinat (Shingrix) 09/16/2017   Pertinent  Health Maintenance Due  Topic Date Due   COLONOSCOPY (Pts 45-81yrs Insurance coverage will need to be confirmed)  05/25/2020   MAMMOGRAM  08/25/2020   INFLUENZA VACCINE  09/26/2020   DEXA SCAN  Completed   PNA vac Low Risk Adult  Completed   Fall Risk  07/08/2020 07/03/2019 06/30/2018 06/18/2017 05/09/2016  Falls in the past year? 1 0 0 No Yes  Comment - - - - pt fell backwards over stacked bricks that were in yard; bruise to right hip  Number falls in past yr: 0 0 - - 1  Injury with Fall? 0 - - - Yes  Risk Factor Category  - - - - -  Comment - - - - -  Follow up Falls evaluation completed Falls evaluation completed - - -     Vitals:   09/08/20 1437  BP: (!) 156/79  Pulse: 75  Resp: 18  Temp: 97.9 F (36.6 C)  Weight: 195 lb 12.8 oz (88.8 kg)  Height: 5\' 6"  (1.676 m)   Body mass index is 31.6 kg/m.  Physical Exam  GENERAL APPEARANCE: Well nourished. In no acute distress. Obese. SKIN:  Right wrist with slight erythema and right hand 2+edema MOUTH and THROAT: Lips are without lesions. Oral mucosa is moist and without lesions.  RESPIRATORY: Breathing is even & unlabored, BS CTAB CARDIAC: RRR, no murmur,no extra heart sounds, right  hand 2+ edema GI: Abdomen soft, normal BS, no masses, no tenderness NEUROLOGICAL: There is no tremor. Speech is clear. Alert and oriented X 3. PSYCHIATRIC:  Affect and behavior are appropriate  Labs reviewed: Recent Labs    09/04/20 0405 09/05/20 0250 09/06/20 0246  NA 136 138 136  K 3.9 4.2 3.9  CL 96* 98 96*  CO2 35* 33* 33*  GLUCOSE 111* 119* 116*  BUN  21 17 17   CREATININE 0.86 1.01* 0.92  CALCIUM 9.1 9.3 9.4   Recent Labs    08/28/20 2033 08/29/20 0412 09/05/20 0250  AST 16 11* 23  ALT 14 13 18   ALKPHOS 63 59 68  BILITOT 0.4 0.6 0.4  PROT 6.4* 5.8* 5.6*  ALBUMIN 2.9* 2.7* 2.2*   Recent Labs     07/08/20 0911 08/28/20 2033 08/29/20 0412 09/02/20 0334 09/04/20 0405 09/05/20 0250 09/06/20 0246  WBC 4.6 7.8 10.4   < > 9.1 8.4 8.5  NEUTROABS 1.9 6.3 6.7  --   --   --   --   HGB 12.3 11.5* 10.8*   < > 9.7* 10.7* 10.2*  HCT 37.3 35.4* 33.3*   < > 30.6* 34.6* 32.0*  MCV 94.8 97.0 97.9   < > 98.1 98.6 97.3  PLT 208.0 327 306   < > 323 309 333   < > = values in this interval not displayed.   Lab Results  Component Value Date   TSH 1.584 09/04/2020   Lab Results  Component Value Date   HGBA1C 5.5 08/29/2020   Lab Results  Component Value Date   CHOL 192 07/08/2020   HDL 70.30 07/08/2020   LDLCALC 101 (H) 07/08/2020   TRIG 101.0 07/08/2020   CHOLHDL 3 07/08/2020    Significant Diagnostic Results in last 30 days:  DG Wrist Complete Right  Result Date: 08/28/2020 CLINICAL DATA:  Pain and swelling. Limited range of motion without pain. EXAM: RIGHT WRIST - COMPLETE 3+ VIEW COMPARISON:  08/18/2020 FINDINGS: There is no acute fracture. There is ulnar minus variance. There is well corticated fragmentation of the ulnar styloid region, consistent with remote injury. Degenerative changes are identified at the first carpometacarpal joint and radiocarpal joint. Degenerative changes of the interphalangeal joint of the thumb. IMPRESSION: Degenerative changes.  No evidence for acute  abnormality. Electronically Signed   By: Norva Pavlov M.D.   On: 08/28/2020 15:07   MR WRIST RIGHT WO CONTRAST  Result Date: 08/30/2020 CLINICAL DATA:  Progressive pain over the last month, possible soft tissue infection EXAM: MR OF THE RIGHT WRIST WITHOUT CONTRAST TECHNIQUE: Multiplanar, multisequence MR imaging of the right wrist was performed. No intravenous contrast was administered. COMPARISON:  Radiographs dated 08/28/2020 FINDINGS: Despite efforts by the technologist and patient, motion artifact is present on today's exam and could not be eliminated. This reduces exam sensitivity and specificity.  Ligaments: Unremarkable Triangular fibrocartilage: Poor conspicuity of the distal TFCC for example on image 18 series 6, this may be attaching to the small ossicles above the remaining ulnar styloid which appear to sit just below the meniscal homologue. Tendons: Extensor carpi ulnaris peritendinitis or tenosynovitis. Carpal tunnel/median nerve: Unremarkable Guyon's canal: Unremarkable Joint/cartilage: Small effusion of the distal radioulnar joint. Possible synovitis. Radiocarpal joint effusion with indistinct margins favoring synovitis. Bones/carpal alignment: 3 mm negative ulnar variance. No lunateomalacia. Mild degenerative spurring at the first carpometacarpal articulation. Low-level periarticular edema at the second, third, and fourth carpometacarpal articulations. Small foci of edema scattered in the carpus. Other: Substantial subcutaneous edema dorsally in the wrist, extending medially and laterally as well. IMPRESSION: 1. Effusion and synovitis in the radiocarpal joint and also in the distal radioulnar joint. This may be secondary to inflammatory arthropathy, infection is not excluded. 2. Dorsal subcutaneous edema along the wrist, cellulitis is not excluded. 3. Poor definition of the distal TFCC cartilage in the vicinity of the small ossicles along the ulnar styloid tip, query remote injury. 4.  Extensor carpi ulnaris peritendinitis or tenosynovitis. 5. Subtle areas of subcortical marrow edema along the second, third, and fourth carpometacarpal articulations attributable to arthropathy. Electronically Signed   By: Gaylyn Rong M.D.   On: 08/30/2020 11:40   XR Wrist Complete Right  Result Date: 08/18/2020 Right wrist 3 views: No acute fractures or acute findings.  Wrist well located.  Heterotopic bone seen in the ulnar gutter region.  Otherwise no bony abnormalities.   Assessment/Plan  1. Right wrist pain -   S/P right wrist dorsal compartmental extensor tendon tenosynovectomy -   follow up with  Dr. Doneen Poisson, orthopedic surgery, in 2 weeks -  continue Oxycodone 5 mg every 6 hours PRN and Ibuprofen 200 mg every 6 hours PRN and Diclofenac 1% gel PRN -   continue Prednisone, Doxycycline  2. Acute pain of right knee -  S/P aspiration, awaiting cell count and crystals  3. Bipolar affective disorder, remission status unspecified (HCC) -  continue Wellbutrin, Divalproex, Sertraline, Buspar and PRN Ativan  4. Chronic obstructive pulmonary disease, unspecified COPD type (HCC) -  no wheezing, continue PRN Iprat-albut neb PRN  5. Hypothyroidism, unspecified type Lab Results  Component Value Date   TSH 1.584 09/04/2020   -  continue Levothyroxine  6. HYPERCHOLESTEROLEMIA, PURE Lab Results  Component Value Date   CHOL 192 07/08/2020   HDL 70.30 07/08/2020   LDLCALC 101 (H) 07/08/2020   TRIG 101.0 07/08/2020   CHOLHDL 3 07/08/2020     -   continue Simvastatin  7. Essential hypertension - continue Metoprolol tartrate  8. Primary insomnia -  continue Remeron  9. OAB -  continue Myrbetriq    Family/ staff Communication:   Discussed plan of care with resident and charge nurse.  Labs/tests ordered:   CBC and BMP on 09/14/20  Goals of care:   Short-term care   Kenard Gower, DNP, MSN, FNP-BC [facility] [phone] (Monday-Friday 8:00 a.m. - 5:00 p.m.) [phone] (after hours)

## 2020-09-12 ENCOUNTER — Telehealth: Payer: Self-pay | Admitting: Orthopaedic Surgery

## 2020-09-12 ENCOUNTER — Ambulatory Visit: Payer: Medicare Other | Admitting: Obstetrics & Gynecology

## 2020-09-12 NOTE — Telephone Encounter (Signed)
Please advise 

## 2020-09-12 NOTE — Telephone Encounter (Signed)
Thank you :)

## 2020-09-12 NOTE — Telephone Encounter (Signed)
Pt called wondering about her home therapy.   CB [phone]

## 2020-09-13 ENCOUNTER — Telehealth: Payer: Self-pay

## 2020-09-13 DIAGNOSIS — M25431 Effusion, right wrist: Secondary | ICD-10-CM

## 2020-09-13 NOTE — Addendum Note (Signed)
Addended by: Fredda Hammed L on: 09/13/2020 09:30 AM   Modules accepted: Orders

## 2020-09-13 NOTE — Telephone Encounter (Signed)
Urgent referral to rheurmatology placed in chart

## 2020-09-13 NOTE — Telephone Encounter (Signed)
Lvm for pt to cb 

## 2020-09-13 NOTE — Telephone Encounter (Signed)
Patient called stating that her right wrist is not getting any better.  Stated that her hand is painful, swollen, and the Oxycodone is helping a little.  Has been elevating and exercising.  Patient has surgery on 09/01/2020.  Cb# [phone].  Please advise.  Thank you.

## 2020-09-13 NOTE — Telephone Encounter (Signed)
Please advise 

## 2020-09-14 ENCOUNTER — Telehealth: Payer: Self-pay | Admitting: Orthopaedic Surgery

## 2020-09-14 NOTE — Telephone Encounter (Signed)
Juliette Alcide (RN) calling from [facility] called with updated info of pt's referral. Juliette Alcide stated pt would like to start her care 09/17/20. Juliette Alcide states no return call needed.

## 2020-09-14 NOTE — Telephone Encounter (Signed)
FYI

## 2020-09-14 NOTE — Telephone Encounter (Signed)
Called pt and advised and she stated understanding  

## 2020-09-15 ENCOUNTER — Ambulatory Visit (INDEPENDENT_AMBULATORY_CARE_PROVIDER_SITE_OTHER): Payer: Medicare Other | Admitting: Physician Assistant

## 2020-09-15 ENCOUNTER — Ambulatory Visit: Payer: Medicare Other | Admitting: Family Medicine

## 2020-09-15 ENCOUNTER — Encounter: Payer: Self-pay | Admitting: Physician Assistant

## 2020-09-15 DIAGNOSIS — M65831 Other synovitis and tenosynovitis, right forearm: Secondary | ICD-10-CM

## 2020-09-15 MED ORDER — OXYCODONE HCL 5 MG PO TABS: 5.0000 mg | ORAL_TABLET | Freq: Four times a day (QID) | ORAL | 0 refills | Status: DC | PRN

## 2020-09-15 NOTE — Progress Notes (Signed)
HPI: Lori Orozco returns today 2 weeks status post right wrist tenosynovectomy.  She states she still having significant pain in the wrist.  In regards to her right knee which was aspirated during her hospital stay I will inject it with cortisone.  She states that the knee is doing much better.  She has referral to rheumatology on 8/18 with Dr. Dimple Casey.  She notes no fevers but she has had some chills.  She states she is still having significant pain.  There has been some confusion and getting home health OT PT set out to her at home. Aspirate from her right knee during her hospitalization showed elevated white count of 4525/ cu mm.  No crystals.  Review of systems see HPI otherwise negative  Right wrist: Dorsal incisions healed well no signs of infection no dehiscence.  Sutures well approximate the incision.  Still with significant edema about the hand.  She has limited range of motion of the fingers.  She is unable to do thumbs up.  Radial pulses 2+.   Impression: Severe tenosynovitis right wrist dorsal compartment  Plan: Removal of sutures Steri-Strips applied she can wash the incision with an antibacterial soap no other application of ointments or peroxide.  Elevation wiggling fingers encouraged.  Arrangements have been made for OT PT to begin this week in working with her.  We will see her back in 4 weeks sooner if there is any questions or concerns.  Refill on her oxycodone IR was given.  Questions encouraged and answered by Lori Orozco and myself.

## 2020-09-19 NOTE — Progress Notes (Signed)
Office Visit Note  Patient: Lori Orozco             Date of Birth: 1950/05/22           MRN: [id number]             PCP: Abner Greenspan, MD Referring: Mcarthur Rossetti* Visit Date: 09/20/2020   Subjective:  New Patient (Initial Visit) (Patient is here today with her best friend, Santiago Glad. Patient complains of right wrist and hand swelling, pain, and stiffness. Patient is 19 days s/o right tynosynovectomy with Dr. Ninfa Linden.)   History of Present Illness: Lori Orozco is a 70 y.o. female here for joint pain and swelling of knees and wrists with severe tenosynovitis s/p tenosynovectomy on 09/01/20 with Dr. Ninfa Linden.  She had been feeling in her usual health until June she recalls onset of symptoms after using a gas leaf blower at her home during which time she experienced some mild right wrist pain.  She does not recall any particular injury event at this time.  However she experienced progressive ongoing increase in pain with swelling and stiffness in her right wrist especially with decreased range of motion and pain over the dorsal aspect.  This is evaluated at orthopedics clinic with aspiration that revealed just coagulated sample trial of steroid medication and only partially improved symptoms then continued worsening.  She was admitted to the hospital for tenosynovectomy that was performed without complication.  Intraoperatively reported extensive inflammatory tissue debridement without any evidence concerning for infection.  During this hospitalization she also developed knee effusion which was aspirated demonstrating inflammatory synovial fluid with negative microscopy and cultures.  Is now almost 3 weeks since her surgery she continues experiencing severe pain intensity swelling around the right hand and wrist and has developed some skin blistering.   Labs reviewed 08/2020 Synovial fluid knee 4,525 WBCs 90% neutrophils ANA neg RF 14.2 CCP neg CBC Hgb 10.2 CMP Albumin  2.2 TSH 1.584 UA neg ESR 37 CRP 14.1 Uric acid 4.9  07/2020 Synovial fluid wrist clotted sample negative gram stain   08/30/20 MRI Right wrist IMPRESSION: 1. Effusion and synovitis in the radiocarpal joint and also in the distal radioulnar joint. This may be secondary to inflammatory arthropathy, infection is not excluded. 2. Dorsal subcutaneous edema along the wrist, cellulitis is not excluded. 3. Poor definition of the distal TFCC cartilage in the vicinity of the small ossicles along the ulnar styloid tip, query remote injury. 4. Extensor carpi ulnaris peritendinitis or tenosynovitis. 5. Subtle areas of subcortical marrow edema along the second, third, and fourth carpometacarpal articulations attributable to arthropathy.   Activities of Daily Living:  Patient reports morning stiffness for 24 hours.   Patient Reports nocturnal pain.  Difficulty dressing/grooming: Reports Difficulty climbing stairs: Reports Difficulty getting out of chair: Reports Difficulty using hands for taps, buttons, cutlery, and/or writing: Reports  Review of Systems  Constitutional:  Positive for fatigue.  HENT:  Positive for mouth dryness and nose dryness. Negative for mouth sores.   Eyes:  Positive for pain, itching and dryness. Negative for visual disturbance.  Respiratory:  Positive for shortness of breath. Negative for cough, hemoptysis and difficulty breathing.   Cardiovascular:  Positive for chest pain and swelling in legs/feet. Negative for palpitations.  Gastrointestinal:  Positive for constipation. Negative for abdominal pain, blood in stool and diarrhea.  Endocrine: Negative for increased urination.  Genitourinary:  Negative for painful urination.  Musculoskeletal:  Positive for joint pain, joint pain, joint swelling,  morning stiffness and muscle tenderness. Negative for myalgias, muscle weakness and myalgias.  Skin:  Positive for skin tightness. Negative for color change, rash and redness.   Allergic/Immunologic: Negative for susceptible to infections.  Neurological:  Positive for light-headedness, memory loss and weakness. Negative for dizziness, numbness and headaches.  Hematological:  Negative for swollen glands.  Psychiatric/Behavioral:  Positive for confusion and sleep disturbance.    PMFS History:  Patient Active Problem List   Diagnosis Date Noted   Bilateral knee pain 09/20/2020   Rheumatoid factor positive 09/20/2020   High risk medication use 09/20/2020   Extensor tenosynovitis of right wrist 09/15/2020   Wrist pain 08/30/2020   Pain and swelling of right wrist 08/28/2020   S/P reverse total shoulder arthroplasty, 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   Lung nodule < 6cm on CT 01/02/2020   Rash and nonspecific skin eruption 12/17/2018   Fever 09/30/2018   Status post arthroscopy of left shoulder 01/16/2018   Estrogen deficiency 06/28/2017   Medial epicondylitis, left elbow 04/29/2017   S/P arthroscopy of left shoulder 02/25/2017   Impingement syndrome of left shoulder 01/30/2017   Pedal edema 10/26/2016   Trochanteric bursitis, right hip 04/10/2016   Essential hypertension 09/02/2015   Urge incontinence 07/27/2015   Obesity 04/28/2015   Need for hepatitis C screening test 04/26/2015   Screening for HIV (human immunodeficiency virus) 04/26/2015   Osteoarthritis of left knee 01/28/2015   Status post total left knee replacement 01/28/2015   Vitamin D deficiency 04/27/2014   Seasonal and perennial allergic rhinitis 06/17/2013   S/P total hip arthroplasty 03/20/2013   Medicare annual wellness visit, subsequent 11/18/2012   History of falling 11/18/2012   Routine general medical examination at a health care facility 24/58/0998   Lichen sclerosus et atrophicus of the vulva 07/29/2012   Osteopenia 10/19/2011   Other screening mammogram 10/19/2011   Hypothyroidism 10/19/2011   ANEMIA 10/11/2009   PERIPHERAL NEUROPATHY, FEET 08/05/2009   BACK PAIN 08/05/2009   HAND PAIN,  BILATERAL 08/05/2009   TREMOR 03/09/2008   MENOPAUSAL SYNDROME 10/09/2007   DEPRESSION, MAJOR 05/20/2007   BIPOLAR AFFECTIVE DISORDER 05/20/2007   Generalized anxiety disorder 05/20/2007   PERSONALITY DISORDER 05/20/2007   ESOPHAGEAL SPASM 05/20/2007   HIATAL HERNIA 05/20/2007   AMAUROSIS FUGAX 05/07/2007   COPD (chronic obstructive pulmonary disease) ([place]) 03/27/2007   HYPERCHOLESTEROLEMIA, PURE 12/10/2006   SYMPTOM, SYNDROME, CHRONIC FATIGUE 12/10/2006    Past Medical History:  Diagnosis Date   Amaurosis fugax    Anemia    hx   Anxiety    Arthritis    Asthma    Cataract    Chronic fatigue syndrome    Chronic kidney disease    frequency, nephritis when 70 yrs old   COPD (chronic obstructive pulmonary disease) ([place])    COVID-19    Depression    Diverticulitis    Emphysema of lung ([facility])    Fibromyalgia    GERD (gastroesophageal reflux disease)    occ   H/O hiatal hernia    History of bronchitis    Hyperlipidemia    Hypothyroidism    Interstitial cystitis    Irritable bowel syndrome    Lichen sclerosus    Lumbar herniated disc    Migraines    Pneumonia    PONV (postoperative nausea and vomiting)    Shortness of breath    exertion    Thyroid disease    [place]   Urinary frequency    Urinary tract infection    Vertigo  Family History  Problem Relation Age of Onset   Arthritis Mother    Hypertension Mother    Kidney disease Mother    Cancer Father        Bladder   Arthritis Sister    Colon cancer Other    Cancer - Lung Cousin    Arthritis Maternal Grandmother    Past Surgical History:  Procedure Laterality Date   ABDOMINAL HYSTERECTOMY     bladder sugery      CAROTID DOPPLAR     COLONOSCOPY  05/26/2010   [facility]- otherwise nl , re check 10y   [place]     elbow surgery     EPICONDYLITIS     EYE SURGERY Bilateral    cataracts   FOOT SURGERY Bilateral    I & D EXTREMITY Right 09/01/2020   Procedure:  TYNOSYNOVECTOMY;  Surgeon: Mcarthur Rossetti, MD;  Location: [facility];  Service: Orthopedics;  Laterality: Right;   KNEE SURGERY     Left cartilage   lipoma in second finger right hand     PLANTAR FASCIA SURGERY Left    REVERSE SHOULDER ARTHROPLASTY Left 02/02/2020   Procedure: LEFT REVERSE SHOULDER ARTHROPLASTY;  Surgeon: Meredith Pel, MD;  Location: [facility];  Service: Orthopedics;  Laterality: Left;   ROTATOR CUFF REPAIR Left 12/2017   TEMPOROMANDIBULAR JOINT SURGERY     TONSILLECTOMY     TOTAL HIP ARTHROPLASTY Right 03/20/2013   Procedure: Right TOTAL HIP ARTHROPLASTY;  Surgeon: Newt Minion, MD;  Location: [facility];  Service: Orthopedics;  Laterality: Right;  Right Total Hip Arthroplasty   TOTAL KNEE ARTHROPLASTY Left 01/28/2015   Procedure: LEFT TOTAL KNEE ARTHROPLASTY;  Surgeon: Mcarthur Rossetti, MD;  Location: [facility];  Service: Orthopedics;  Laterality: Left;   TRIGGER FINGER RELEASE Right    TROCHANTERIC BURSA EXCISION Right 05/03/2016   Dr. Jean Rosenthal   WRIST ARTHROSCOPY Right    ligament tear   Social History   Social History Narrative   Not on file   Immunization History  Administered Date(s) Administered   Influenza Split 11/19/2011, 11/02/2013   Influenza Whole 12/20/2006, 11/25/2008, 11/26/2009, 11/24/2010   Influenza, High Dose Seasonal PF 12/13/2016   Influenza,inj,Quad PF,6+ Mos 12/06/2015   Influenza-Unspecified 12/08/2012, 12/03/2014, 12/08/2017, 11/10/2018, 10/31/2019   Moderna Sars-Covid-2 Vaccination 06/21/2019, 07/19/2019, 01/22/2020   PFIZER(Purple Top)SARS-COV-2 Vaccination 06/04/2020   Pneumococcal Conjugate-13 12/06/2015   Pneumococcal Polysaccharide-23 01/26/2002, 03/23/2008, 09/07/2010, 06/28/2017   Td 06/26/2000, 10/19/2011   Zoster Recombinat (Shingrix) 09/16/2017     Objective: Vital Signs: BP 119/73 (BP Location: Left Arm, Patient Position: Sitting, Cuff Size: Normal)   Pulse 88   Ht _0  (1.676 m) Comment: Patient  stated  Wt 182 lb (82.6 kg)   BMI 29.38 kg/m    Physical Exam Cardiovascular:     Rate and Rhythm: Normal rate and regular rhythm.  Pulmonary:     Effort: Pulmonary effort is normal.     Breath sounds: Normal breath sounds.  Skin:    General: Skin is warm and dry.     Comments: Right hand tense edema and few bullae  Neurological:     Mental Status: She is alert.     Musculoskeletal Exam:  Neck full ROM no tenderness Shoulders full ROM no tenderness or swelling Elbows full ROM no tenderness or swelling Right wrist dorsal surgical site closed, few sutures in place, severely swollen with tense edema and several bullae over dorsum of hand, ultrasound  inspection of the hand does not show discrete joint effusions ut very extensive soft tissue edema Knees full ROM , mild tenderness no palpable effusion Ankles full ROM no tenderness or swelling  Investigation: No additional findings.  Imaging: DG Wrist Complete Right  Result Date: 08/28/2020 CLINICAL DATA:  Pain and swelling. Limited range of motion without pain. EXAM: RIGHT WRIST - COMPLETE 3+ VIEW COMPARISON:  08/18/2020 FINDINGS: There is no acute fracture. There is ulnar minus variance. There is well corticated fragmentation of the ulnar styloid region, consistent with remote injury. Degenerative changes are identified at the first carpometacarpal joint and radiocarpal joint. Degenerative changes of the interphalangeal joint of the thumb. IMPRESSION: Degenerative changes.  No evidence for acute  abnormality. Electronically Signed   By: Nolon Nations M.D.   On: 08/28/2020 15:07   MR WRIST RIGHT WO CONTRAST  Result Date: 08/30/2020 CLINICAL DATA:  Progressive pain over the last month, possible soft tissue infection EXAM: MR OF THE RIGHT WRIST WITHOUT CONTRAST TECHNIQUE: Multiplanar, multisequence MR imaging of the right wrist was performed. No intravenous contrast was administered. COMPARISON:  Radiographs dated 08/28/2020 FINDINGS:  Despite efforts by the technologist and patient, motion artifact is present on today's exam and could not be eliminated. This reduces exam sensitivity and specificity. Ligaments: Unremarkable Triangular fibrocartilage: Poor conspicuity of the distal TFCC for example on image 18 series 6, this may be attaching to the small ossicles above the remaining ulnar styloid which appear to sit just below the meniscal homologue. Tendons: Extensor carpi ulnaris peritendinitis or tenosynovitis. Carpal tunnel/median nerve: Unremarkable Guyon's canal: Unremarkable Joint/cartilage: Small effusion of the distal radioulnar joint. Possible synovitis. Radiocarpal joint effusion with indistinct margins favoring synovitis. Bones/carpal alignment: 3 mm negative ulnar variance. No lunateomalacia. Mild degenerative spurring at the first carpometacarpal articulation. Low-level periarticular edema at the second, third, and fourth carpometacarpal articulations. Small foci of edema scattered in the carpus. Other: Substantial subcutaneous edema dorsally in the wrist, extending medially and laterally as well. IMPRESSION: 1. Effusion and synovitis in the radiocarpal joint and also in the distal radioulnar joint. This may be secondary to inflammatory arthropathy, infection is not excluded. 2. Dorsal subcutaneous edema along the wrist, cellulitis is not excluded. 3. Poor definition of the distal TFCC cartilage in the vicinity of the small ossicles along the ulnar styloid tip, query remote injury. 4. Extensor carpi ulnaris peritendinitis or tenosynovitis. 5. Subtle areas of subcortical marrow edema along the second, third, and fourth carpometacarpal articulations attributable to arthropathy. Electronically Signed   By: Van Clines M.D.   On: 08/30/2020 11:40    Recent Labs: Lab Results  Component Value Date   WBC 8.5 09/06/2020   HGB 10.2 (L) 09/06/2020   PLT 333 09/06/2020   NA 136 09/06/2020   K 3.9 09/06/2020   CL 96 (L)  09/06/2020   CO2 33 (H) 09/06/2020   GLUCOSE 116 (H) 09/06/2020   BUN 17 09/06/2020   CREATININE 0.92 09/06/2020   BILITOT 0.4 09/05/2020   ALKPHOS 68 09/05/2020   AST 23 09/05/2020   ALT 18 09/05/2020   PROT 6.6 09/20/2020   ALBUMIN 2.2 (L) 09/05/2020   CALCIUM 9.4 09/06/2020   GFRAA >60 01/29/2015   QFTBGOLDPLUS NEGATIVE 09/20/2020    Speciality Comments: No specialty comments available.  Procedures:  No procedures performed Allergies: Lithium; Tegretol [carbamazepine]; Tricyclic antidepressants; Strawberry extract; Codeine; Cymbalta [duloxetine hcl]; Erythromycin; Lyrica [pregabalin]; Neurontin [gabapentin]; Rabeprazole sodium; Duraprep [antiseptic products, misc.]; Penicillins; and Tape   Assessment / Plan:  Visit Diagnoses: Pain and swelling of right wrist  Extensor tenosynovitis of right wrist Acute pain of both knees Rheumatoid factor positive - Plan: Sedimentation rate, Rheumatoid factor  Severely swollen right hand and wrist with extensive subcutaneous tissue edema distribution seems more tenosynovitis than joint arthritis.  Recheck sedimentation rate rheumatoid factor serology.  With the involvement of knee effusions during the hospital visit this time more suggestive for a systemic inflammatory condition versus localized infection.  It is unusual that she describes very limited response to initial prednisone tapering probably recommend a trial of lower intensity longer duration to reduce this inflammation acutely I would expect to see some response to anti-inflammatory treatment if this is amenable to DMARD therapy versus other unidentified underlying cause of  High risk medication use - Plan: Hepatitis C antibody, Hepatitis B surface antigen, Hepatitis B core antibody, IgM, IgG, IgA, IgM, Serum protein electrophoresis with reflex, QuantiFERON-TB Gold Plus  Anticipate treatment DMARD or inflammatory arthritis checking baseline medication monitoring with hepatitis  serology, and immunoglobulins, and TB screen.  Orders: Orders Placed This Encounter  Procedures   Sedimentation rate   Hepatitis C antibody   Hepatitis B surface antigen   Hepatitis B core antibody, IgM   IgG, IgA, IgM   Serum protein electrophoresis with reflex   QuantiFERON-TB Gold Plus   Rheumatoid factor   IFE Interpretation    No orders of the defined types were placed in this encounter.    Follow-Up Instructions: No follow-ups on file.   Collier Salina, MD  Note - This record has been created using [employer].  Chart creation errors have been sought, but may not always  have been located. Such creation errors do not reflect on  the standard of medical care.

## 2020-09-20 ENCOUNTER — Other Ambulatory Visit: Payer: Self-pay

## 2020-09-20 ENCOUNTER — Ambulatory Visit (INDEPENDENT_AMBULATORY_CARE_PROVIDER_SITE_OTHER): Payer: Medicare Other | Admitting: Internal Medicine

## 2020-09-20 ENCOUNTER — Encounter: Payer: Self-pay | Admitting: Internal Medicine

## 2020-09-20 VITALS — BP 119/73 | HR 88 | Ht 66.0 in | Wt 182.0 lb

## 2020-09-20 DIAGNOSIS — M0600 Rheumatoid arthritis without rheumatoid factor, unspecified site: Secondary | ICD-10-CM | POA: Insufficient documentation

## 2020-09-20 DIAGNOSIS — M25531 Pain in right wrist: Secondary | ICD-10-CM | POA: Diagnosis not present

## 2020-09-20 DIAGNOSIS — M25561 Pain in right knee: Secondary | ICD-10-CM | POA: Insufficient documentation

## 2020-09-20 DIAGNOSIS — M65831 Other synovitis and tenosynovitis, right forearm: Secondary | ICD-10-CM

## 2020-09-20 DIAGNOSIS — Z79899 Other long term (current) drug therapy: Secondary | ICD-10-CM | POA: Insufficient documentation

## 2020-09-20 DIAGNOSIS — M25431 Effusion, right wrist: Secondary | ICD-10-CM

## 2020-09-20 DIAGNOSIS — R768 Other specified abnormal immunological findings in serum: Secondary | ICD-10-CM

## 2020-09-20 DIAGNOSIS — M25562 Pain in left knee: Secondary | ICD-10-CM

## 2020-09-20 NOTE — Patient Instructions (Addendum)
Prednisone tablets What is this medication? PREDNISONE (PRED ni sone) is a corticosteroid. It is commonly used to treat inflammation of the skin, joints, lungs, and other organs. Common conditions treated include asthma, allergies, and arthritis. It is also used for otherconditions, such as blood disorders and diseases of the adrenal glands. This medicine may be used for other purposes; ask your health care provider orpharmacist if you have questions. COMMON BRAND NAME(S): Deltasone, Predone, Sterapred, Sterapred DS What should I tell my care team before I take this medication? They need to know if you have any of these conditions: Cushing's syndrome diabetes glaucoma heart disease high blood pressure infection (especially a virus infection such as chickenpox, cold sores, or herpes) kidney disease liver disease mental illness myasthenia gravis osteoporosis seizures stomach or intestine problems thyroid disease an unusual or allergic reaction to lactose, prednisone, other medicines, foods, dyes, or preservatives pregnant or trying to get pregnant breast-feeding How should I use this medication? Take this medicine by mouth with a glass of water. Follow the directions on the prescription label. Take this medicine with food. If you are taking this medicine once a day, take it in the morning. Do not take more medicine than you are told to take. Do not suddenly stop taking your medicine because you may develop a severe reaction. Your doctor will tell you how much medicine to take. If your doctor wants you to stop the medicine, the dose may be slowly loweredover time to avoid any side effects. Talk to your pediatrician regarding the use of this medicine in children.Special care may be needed. Overdosage: If you think you have taken too much of this medicine contact apoison control center or emergency room at once. NOTE: This medicine is only for you. Do not share this medicine with others. What  if I miss a dose? If you miss a dose, take it as soon as you can. If it is almost time for your next dose, talk to your doctor or health care professional. You may need to miss a dose or take an extra dose. Do not take double or extra doses withoutadvice. What may interact with this medication? Do not take this medicine with any of the following medications: metyrapone mifepristone This medicine may also interact with the following medications: aminoglutethimide amphotericin B aspirin and aspirin-like medicines barbiturates certain medicines for diabetes, like glipizide or glyburide cholestyramine cholinesterase inhibitors cyclosporine digoxin diuretics ephedrine female hormones, like estrogens and birth control pills isoniazid ketoconazole NSAIDS, medicines for pain and inflammation, like ibuprofen or naproxen phenytoin rifampin toxoids vaccines warfarin This list may not describe all possible interactions. Give your health care provider a list of all the medicines, herbs, non-prescription drugs, or dietary supplements you use. Also tell them if you smoke, drink alcohol, or use illegaldrugs. Some items may interact with your medicine. What should I watch for while using this medication? Visit your doctor or health care professional for regular checks on your progress. If you are taking this medicine over a prolonged period, carry an identification card with your name and address, the type and dose of yourmedicine, and your doctor's name and address. This medicine may increase your risk of getting an infection. Tell your doctor or health care professional if you are around anyone with measles orchickenpox, or if you develop sores or blisters that do not heal properly. If you are going to have surgery, tell your doctor or health care professionalthat you have taken this medicine within the last twelve months. Ask  your doctor or health care professional about your diet. You may need  tolower the amount of salt you eat. This medicine may increase blood sugar. Ask your healthcare provider if changesin diet or medicines are needed if you have diabetes. What side effects may I notice from receiving this medication? Side effects that you should report to your doctor or health care professionalas soon as possible: allergic reactions like skin rash, itching or hives, swelling of the face, lips, or tongue changes in emotions or moods changes in vision depressed mood eye pain fever or chills, cough, sore throat, pain or difficulty passing urine signs and symptoms of high blood sugar such as being more thirsty or hungry or having to urinate more than normal. You may also feel very tired or have blurry vision. swelling of ankles, feet Side effects that usually do not require medical attention (report to yourdoctor or health care professional if they continue or are bothersome): confusion, excitement, restlessness headache nausea, vomiting skin problems, acne, thin and shiny skin trouble sleeping weight gain This list may not describe all possible side effects. Call your doctor for medical advice about side effects. You may report side effects to FDA at1-800-FDA-1088. Where should I keep my medication? Keep out of the reach of children. Store at room temperature between 15 and 30 degrees C (59 and 86 degrees F). Protect from light. Keep container tightly closed. Throw away any unusedmedicine after the expiration date. Methotrexate Tablets What is this medication? METHOTREXATE (METH oh TREX ate) treats inflammatory conditions such as arthritis and psoriasis. It works by decreasing inflammation, which can reduce pain and prevent long-term injury to the joints and skin. It may also be used to treat some types of cancer. It works by slowing down the growth of cancercells. This medicine may be used for other purposes; ask your health care provider orpharmacist if you have  questions. COMMON BRAND NAME(S): Rheumatrex, Trexall What should I tell my care team before I take this medication? They need to know if you have any of these conditions: Fluid in the stomach area or lungs If you often drink alcohol Infection or immune system problems Kidney disease or on hemodialysis Liver disease Low blood counts, like low white cell, platelet, or red cell counts Lung disease Radiation therapy Stomach ulcers Ulcerative colitis An unusual or allergic reaction to methotrexate, other medications, foods, dyes, or preservatives Pregnant or trying to get pregnant Breast-feeding How should I use this medication? Take this medication by mouth with a glass of water. Follow the directions on the prescription label. Take your medication at regular intervals. Do not take it more often than directed. Do not stop taking except on your care team'sadvice. Make sure you know why you are taking this medication and how often you should take it. If this medication is used for a condition that is not cancer, like arthritis or psoriasis, it should be taken weekly, NOT daily. Taking thismedication more often than directed can cause serious side effects, even death. Talk to your care team about safe handling and disposal of this medication. Youmay need to take special precautions. Talk to your care team about the use of this medication in children. While thismedication may be prescribed for selected conditions, precautions do apply. Overdosage: If you think you have taken too much of this medicine contact apoison control center or emergency room at once. NOTE: This medicine is only for you. Do not share this medicine with others. What if I miss a dose? If  you miss a dose, talk with your care team. Do not take double or extra doses. What may interact with this medication? Do not take this medication with any of the following: Acitretin This medication may also interact with the  following: Aspirin and aspirin-like medications including salicylates Azathioprine Certain antibiotics like penicillins, tetracycline, and chloramphenicol Certain medications that treat or prevent blood clots like warfarin, apixaban, dabigatran, and rivaroxaban Certain medications for stomach problems like esomeprazole, omeprazole, pantoprazole Cyclosporine Dapsone Diuretics Gold Hydroxychloroquine Live virus vaccines Medications for infection like acyclovir, adefovir, amphotericin B, bacitracin, cidofovir, foscarnet, ganciclovir, gentamicin, pentamidine, vancomycin Mercaptopurine NSAIDs, medications for pain and inflammation, like ibuprofen or naproxen Other cytotoxic agents Pamidronate Pemetrexed Penicillamine Phenylbutazone Phenytoin Probenecid Pyrimethamine Retinoids such as isotretinoin and tretinoin Steroid medications like prednisone or cortisone Sulfonamides like sulfasalazine and trimethoprim/sulfamethoxazole Theophylline Zoledronic acid This list may not describe all possible interactions. Give your health care provider a list of all the medicines, herbs, non-prescription drugs, or dietary supplements you use. Also tell them if you smoke, drink alcohol, or use illegaldrugs. Some items may interact with your medicine. What should I watch for while using this medication? Avoid alcoholic drinks. This medication can make you more sensitive to the sun. Keep out of the sun. If you cannot avoid being in the sun, wear protective clothing and use sunscreen.Do not use sun lamps or tanning beds/booths. You may need blood work done while you are taking this medication. Call your care team for advice if you get a fever, chills or sore throat, or other symptoms of a cold or flu. Do not treat yourself. This medication decreases your body's ability to fight infections. Try to avoid being aroundpeople who are sick. This medication may increase your risk to bruise or bleed. Call your care  teamif you notice any unusual bleeding. Be careful brushing or flossing your teeth or using a toothpick because you may get an infection or bleed more easily. If you have any dental work done, Estate agent you are receiving this medication. Check with your care team if you get an attack of severe diarrhea, nausea and vomiting, or if you sweat a lot. The loss of too much body fluid can make itdangerous for you to take this medication. Talk to your care team about your risk of cancer. You may be more at risk forcertain types of cancers if you take this medication. Do not become pregnant while taking this medication or for 6 months after stopping it. Women should inform their care team if they wish to become pregnant or think they might be pregnant. Men should not father a child while taking this medication and for 3 months after stopping it. There is potential for serious harm to an unborn child. Talk to your care team for more information. Do not breast-feed an infant while taking this medication or for 1week after stopping it. This medication may make it more difficult to get pregnant or father a child.Talk to your care team if you are concerned about your fertility. What side effects may I notice from receiving this medication? Side effects that you should report to your care team as soon as possible: Allergic reactions-skin rash, itching, hives, swelling of the face, lips, tongue, or throat Blood clot-pain, swelling, or warmth in the leg, shortness of breath, chest pain Dry cough, shortness of breath or trouble breathing Infection-fever, chills, cough, sore throat, wounds that don't heal, pain or trouble when passing urine, general feeling of discomfort or being unwell Kidney injury-decrease  in the amount of urine, swelling of the ankles, hands, or feet Liver injury-right upper belly pain, loss of appetite, nausea, light-colored stool, dark yellow or brown urine, yellowing of the skin or eyes,  unusual weakness or fatigue Low red blood cell count-unusual weakness or fatigue, dizziness, headache, trouble breathing Redness, blistering, peeling, or loosening of the skin, including inside the mouth Seizures Unusual bruising or bleeding Side effects that usually do not require medical attention (report to your careteam if they continue or are bothersome): Diarrhea Dizziness Hair loss Nausea Pain, redness, or swelling with sores inside the mouth or throat Vomiting This list may not describe all possible side effects. Call your doctor for medical advice about side effects. You may report side effects to FDA at1-800-FDA-1088. Where should I keep my medication? Keep out of the reach of children and pets. Store at room temperature between 20 and 25 degrees C (68 and 77 degrees F).Protect from light. Get rid of any unused medication after the expiration date. Talk to your care team about how to dispose of unused medication. Specialdirections may apply. NOTE: This sheet is a summary. It may not cover all possible information. If you have questions about this medicine, talk to your doctor, pharmacist, orhealth care provider.  2022 [employer] (2020-03-21 15:55:45)

## 2020-09-26 NOTE — Assessment & Plan Note (Signed)
No complaint at this visit

## 2020-09-26 NOTE — Assessment & Plan Note (Signed)
Moderate uncomplicated Ok to stay off inhalers for now. Restart as needed.

## 2020-09-27 ENCOUNTER — Other Ambulatory Visit: Payer: Self-pay | Admitting: Physician Assistant

## 2020-09-27 ENCOUNTER — Telehealth: Payer: Self-pay | Admitting: Internal Medicine

## 2020-09-27 ENCOUNTER — Telehealth: Payer: Self-pay | Admitting: Physician Assistant

## 2020-09-27 LAB — PROTEIN ELECTROPHORESIS, SERUM, WITH REFLEX
Abnormal Protein Band1: 0.3 g/dL — ABNORMAL HIGH
Albumin ELP: 3 g/dL — ABNORMAL LOW (ref 3.8–4.8)
Alpha 1: 0.5 g/dL — ABNORMAL HIGH (ref 0.2–0.3)
Alpha 2: 1 g/dL — ABNORMAL HIGH (ref 0.5–0.9)
Beta 2: 0.5 g/dL (ref 0.2–0.5)
Beta Globulin: 0.5 g/dL (ref 0.4–0.6)
Gamma Globulin: 1 g/dL (ref 0.8–1.7)
Total Protein: 6.6 g/dL (ref 6.1–8.1)

## 2020-09-27 LAB — HEPATITIS B CORE ANTIBODY, IGM: Hep B C IgM: NONREACTIVE

## 2020-09-27 LAB — SEDIMENTATION RATE: Sed Rate: 108 mm/h — ABNORMAL HIGH (ref 0–30)

## 2020-09-27 LAB — HEPATITIS C ANTIBODY
Hepatitis C Ab: NONREACTIVE
SIGNAL TO CUT-OFF: 0.01 (ref <1.00)

## 2020-09-27 LAB — IFE INTERPRETATION

## 2020-09-27 LAB — IGG, IGA, IGM
IgG (Immunoglobin G), Serum: 1208 mg/dL (ref 600–1540)
IgM, Serum: 36 mg/dL — ABNORMAL LOW (ref 50–300)
Immunoglobulin A: 352 mg/dL — ABNORMAL HIGH (ref 70–320)

## 2020-09-27 LAB — RHEUMATOID FACTOR: Rheumatoid fact SerPl-aCnc: <14 IU/mL (ref <14)

## 2020-09-27 LAB — QUANTIFERON-TB GOLD PLUS
Mitogen-NIL: 2.04 IU/mL
NIL: 0.08 IU/mL
QuantiFERON-TB Gold Plus: NEGATIVE
TB1-NIL: <0 IU/mL
TB2-NIL: 0 IU/mL

## 2020-09-27 LAB — HEPATITIS B SURFACE ANTIGEN: Hepatitis B Surface Ag: NONREACTIVE

## 2020-09-27 MED ORDER — OXYCODONE HCL 5 MG PO TABS: 5.0000 mg | ORAL_TABLET | Freq: Four times a day (QID) | ORAL | 0 refills | Status: DC | PRN

## 2020-09-27 MED ORDER — PREDNISONE 20 MG PO TABS: 20.0000 mg | ORAL_TABLET | Freq: Every day | ORAL | 0 refills | Status: DC

## 2020-09-27 NOTE — Telephone Encounter (Signed)
Patient calling to see if lab results have come back yet? Patient is still in pain with her hand, and slowly running out of pain medication for her hand. Please call today to advise patient / give an update.

## 2020-09-27 NOTE — Telephone Encounter (Signed)
Pt would like refill on oxycodone

## 2020-09-27 NOTE — Addendum Note (Signed)
Addended by: Fuller Plan on: 09/27/2020 03:37 PM   Modules accepted: Orders

## 2020-09-27 NOTE — Telephone Encounter (Signed)
Please advise 

## 2020-09-27 NOTE — Telephone Encounter (Signed)
Labs all resulted and I discussed these with Lori Orozco. Recommend starting prednisone back at 20 mg daily dose for severe inflammation in wrist and some in knees. So far no evidence for infection, repeat RF also negative though. She asked about PRN pain medication that seems reasonable considering her acute symptoms, but I recommended asking orthopedics clinic about reordering this first to avoid overlapping prescribers if possible. She was previously scheduled to f/u 8/18 but appointment now shows cancelled on the chart I am not sure why, I do want to follow up after she sees ortho again (scheduled 8/17).

## 2020-10-12 ENCOUNTER — Ambulatory Visit (INDEPENDENT_AMBULATORY_CARE_PROVIDER_SITE_OTHER): Payer: Medicare Other | Admitting: Physician Assistant

## 2020-10-12 ENCOUNTER — Other Ambulatory Visit: Payer: Self-pay

## 2020-10-12 ENCOUNTER — Encounter: Payer: Self-pay | Admitting: Physician Assistant

## 2020-10-12 DIAGNOSIS — M65831 Other synovitis and tenosynovitis, right forearm: Secondary | ICD-10-CM

## 2020-10-12 MED ORDER — OXYCODONE HCL 5 MG PO TABS: 5.0000 mg | ORAL_TABLET | Freq: Four times a day (QID) | ORAL | 0 refills | Status: DC | PRN

## 2020-10-12 NOTE — Progress Notes (Signed)
Office Visit Note  Patient: Lori Orozco             Date of Birth: 12-Dec-1950           MRN: [id number]             PCP: Abner Greenspan, MD Referring: Tower, Wynelle Fanny, MD Visit Date: 10/13/2020   Subjective:  Follow-up (Patient has noticed improvement with right wrist swelling but complains of continued pain. Patient is scheduled to start hand therapy in [place] next week. Patient is taking Prednisone 20 mg daily. )   History of Present Illness: Lori Orozco is a 70 y.o. 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  Since her last visit wrist pain and swelling and redness continues with a large decrease in severity of the hand edema and skin changes.  She denies any particular symptomatic side effects of the prednisone.  Orthopedic surgery clinic follow-up without any postsurgical concerns reported.  She has been doing at home exercise with plans to go for formal OT outpatient.  Previous HPI: 09/20/20 Lori Orozco is a 70 y.o. female here for joint pain and swelling of knees and wrists with severe tenosynovitis s/p tenosynovectomy on 09/01/20 with Dr. Ninfa Linden.  She had been feeling in her usual health until June she recalls onset of symptoms after using a gas leaf blower at her home during which time she experienced some mild right wrist pain.  She does not recall any particular injury event at this time.  However she experienced progressive ongoing increase in pain with swelling and stiffness in her right wrist especially with decreased range of motion and pain over the dorsal aspect.  This is evaluated at orthopedics clinic with aspiration that revealed just coagulated sample trial of steroid medication and only partially improved symptoms then continued worsening.  She was admitted to the hospital for  tenosynovectomy that was performed without complication.  Intraoperatively reported extensive inflammatory tissue debridement without any evidence concerning for infection.  During this hospitalization she also developed knee effusion which was aspirated demonstrating inflammatory synovial fluid with negative microscopy and cultures.  Is now almost 3 weeks since her surgery she continues experiencing severe pain intensity swelling around the right hand and wrist and has developed some skin blistering.     Labs reviewed 08/2020 Synovial fluid knee 4,525 WBCs 90% neutrophils ANA neg RF 14.2 CCP neg CBC Hgb 10.2 CMP Albumin 2.2 TSH 1.584 UA neg ESR 37 CRP 14.1 Uric acid 4.9   07/2020 Synovial fluid wrist clotted sample negative gram stain     08/30/20 MRI Right wrist IMPRESSION: 1. Effusion and synovitis in the radiocarpal joint and also in the distal radioulnar joint. This may be secondary to inflammatory arthropathy, infection is not excluded. 2. Dorsal subcutaneous edema along the wrist, cellulitis is not excluded. 3. Poor definition of the distal TFCC cartilage in the vicinity of the small ossicles along the ulnar styloid tip, query remote injury. 4. Extensor carpi ulnaris peritendinitis or tenosynovitis. 5. Subtle areas of subcortical marrow edema along the second, third, and fourth carpometacarpal articulations attributable to arthropathy.  Review of Systems  Constitutional:  Positive for fatigue.  HENT:  Positive for mouth dryness. Negative for mouth sores and nose dryness.   Eyes:  Positive for itching, visual disturbance and dryness. Negative for pain.  Respiratory:  Positive  for shortness of breath. Negative for cough, hemoptysis and difficulty breathing.   Cardiovascular:  Negative for chest pain, palpitations and swelling in legs/feet.  Gastrointestinal:  Negative for abdominal pain, blood in stool, constipation and diarrhea.  Endocrine: Negative for increased urination.   Genitourinary:  Negative for painful urination.  Musculoskeletal:  Positive for joint pain, joint pain, joint swelling and morning stiffness. Negative for myalgias, muscle weakness, muscle tenderness and myalgias.  Skin:  Negative for color change, rash and redness.  Allergic/Immunologic: Negative for susceptible to infections.  Neurological:  Positive for headaches. Negative for dizziness, numbness, memory loss and weakness.  Hematological:  Negative for swollen glands.  Psychiatric/Behavioral:  Negative for confusion and sleep disturbance.    PMFS History:  Patient Active Problem List   Diagnosis Date Noted   Bilateral knee pain 09/20/2020   Seronegative rheumatoid arthritis ([place]) 09/20/2020   High risk medication use 09/20/2020   Extensor tenosynovitis of right wrist 09/15/2020   Wrist pain 08/30/2020   Pain and swelling of right wrist 08/28/2020   S/P reverse total shoulder arthroplasty, 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   Lung nodule < 6cm on CT 01/02/2020   Rash and nonspecific skin eruption 12/17/2018   Fever 09/30/2018   Status post arthroscopy of left shoulder 01/16/2018   Estrogen deficiency 06/28/2017   Medial epicondylitis, left elbow 04/29/2017   S/P arthroscopy of left shoulder 02/25/2017   Impingement syndrome of left shoulder 01/30/2017   Pedal edema 10/26/2016   Trochanteric bursitis, right hip 04/10/2016   Essential hypertension 09/02/2015   Urge incontinence 07/27/2015   Obesity 04/28/2015   Need for hepatitis C screening test 04/26/2015   Screening for HIV (human immunodeficiency virus) 04/26/2015   Osteoarthritis of left knee 01/28/2015   Status post total left knee replacement 01/28/2015   Vitamin D deficiency 04/27/2014   Seasonal and perennial allergic rhinitis 06/17/2013   S/P total hip arthroplasty 03/20/2013   Medicare annual wellness visit, subsequent 11/18/2012   History of falling 11/18/2012   Routine general medical examination at a health care facility  10/09/4816   Lichen sclerosus et atrophicus of the vulva 07/29/2012   Osteopenia 10/19/2011   Other screening mammogram 10/19/2011   Hypothyroidism 10/19/2011   ANEMIA 10/11/2009   PERIPHERAL NEUROPATHY, FEET 08/05/2009   BACK PAIN 08/05/2009   HAND PAIN, BILATERAL 08/05/2009   TREMOR 03/09/2008   MENOPAUSAL SYNDROME 10/09/2007   DEPRESSION, MAJOR 05/20/2007   BIPOLAR AFFECTIVE DISORDER 05/20/2007   Generalized anxiety disorder 05/20/2007   PERSONALITY DISORDER 05/20/2007   ESOPHAGEAL SPASM 05/20/2007   HIATAL HERNIA 05/20/2007   AMAUROSIS FUGAX 05/07/2007   COPD mixed type ([place]) 03/27/2007   HYPERCHOLESTEROLEMIA, PURE 12/10/2006   SYMPTOM, SYNDROME, CHRONIC FATIGUE 12/10/2006    Past Medical History:  Diagnosis Date   Amaurosis fugax    Anemia    hx   Anxiety    Arthritis    Asthma    Cataract    Chronic fatigue syndrome    Chronic kidney disease    frequency, nephritis when 70 yrs old   COPD (chronic obstructive pulmonary disease) ([place])    COVID-19    Depression    Diverticulitis    Emphysema of lung ([facility])    Fibromyalgia    GERD (gastroesophageal reflux disease)    occ   H/O hiatal hernia    History of bronchitis    Hyperlipidemia    Hypothyroidism    Interstitial cystitis    Irritable bowel syndrome    Lichen sclerosus  Lumbar herniated disc    Migraines    Pneumonia    PONV (postoperative nausea and vomiting)    Shortness of breath    exertion    Thyroid disease    [place]   Urinary frequency    Urinary tract infection    Vertigo     Family History  Problem Relation Age of Onset   Arthritis Mother    Hypertension Mother    Kidney disease Mother    Cancer Father        Bladder   Arthritis Sister    Colon cancer Other    Cancer - Lung Cousin    Arthritis Maternal Grandmother    Past Surgical History:  Procedure Laterality Date   ABDOMINAL HYSTERECTOMY     bladder sugery      CAROTID DOPPLAR     COLONOSCOPY  05/26/2010   [facility]-  otherwise nl , re check 10y   DEXA-OSTEOPENIA     DOPPLER ECHOCARDIOGRAPHY     elbow surgery     EPICONDYLITIS     EYE SURGERY Bilateral    cataracts   FOOT SURGERY Bilateral    I & D EXTREMITY Right 09/01/2020   Procedure: TYNOSYNOVECTOMY;  Surgeon: Mcarthur Rossetti, MD;  Location: [facility];  Service: Orthopedics;  Laterality: Right;   KNEE SURGERY     Left cartilage   lipoma in second finger right hand     PLANTAR FASCIA SURGERY Left    REVERSE SHOULDER ARTHROPLASTY Left 02/02/2020   Procedure: LEFT REVERSE SHOULDER ARTHROPLASTY;  Surgeon: Meredith Pel, MD;  Location: [facility];  Service: Orthopedics;  Laterality: Left;   ROTATOR CUFF REPAIR Left 12/2017   TEMPOROMANDIBULAR JOINT SURGERY     TONSILLECTOMY     TOTAL HIP ARTHROPLASTY Right 03/20/2013   Procedure: Right TOTAL HIP ARTHROPLASTY;  Surgeon: Newt Minion, MD;  Location: [facility];  Service: Orthopedics;  Laterality: Right;  Right Total Hip Arthroplasty   TOTAL KNEE ARTHROPLASTY Left 01/28/2015   Procedure: LEFT TOTAL KNEE ARTHROPLASTY;  Surgeon: Mcarthur Rossetti, MD;  Location: [facility];  Service: Orthopedics;  Laterality: Left;   TRIGGER FINGER RELEASE Right    TROCHANTERIC BURSA EXCISION Right 05/03/2016   Dr. Jean Rosenthal   WRIST ARTHROSCOPY Right    ligament tear   Social History   Social History Narrative   Not on file   Immunization History  Administered Date(s) Administered   Influenza Split 11/19/2011, 11/02/2013   Influenza Whole 12/20/2006, 11/25/2008, 11/26/2009, 11/24/2010   Influenza, High Dose Seasonal PF 12/13/2016   Influenza,inj,Quad PF,6+ Mos 12/06/2015   Influenza-Unspecified 12/08/2012, 12/03/2014, 12/08/2017, 11/10/2018, 10/31/2019   Moderna Sars-Covid-2 Vaccination 06/21/2019, 07/19/2019, 01/22/2020   PFIZER(Purple Top)SARS-COV-2 Vaccination 06/04/2020   Pneumococcal Conjugate-13 12/06/2015   Pneumococcal Polysaccharide-23 01/26/2002, 03/23/2008, 09/07/2010, 06/28/2017   Td  06/26/2000, 10/19/2011   Zoster Recombinat (Shingrix) 09/16/2017     Objective: Vital Signs: BP (!) 166/85 (BP Location: Left Arm, Patient Position: Sitting, Cuff Size: Normal)   Pulse 83   Ht _0  (1.676 m)   Wt 190 lb 12.8 oz (86.5 kg)   BMI 30.80 kg/m    Physical Exam Skin:    General: Skin is warm and dry.     Comments: Right wrist erythema, well-healing surgical site  Neurological:     Mental Status: She is alert.  Psychiatric:        Mood and Affect: Mood normal.    Musculoskeletal Exam:  Elbows full ROM no tenderness or swelling  Right wrist warm to touch swollen decreased flexion and extension range of motion and tenderness to pressure, left wrist is normal Reduced finger flexion range of motion with some tenderness around MCP joints and radiating towards the wrist, continued soft tissue edema difficult to distinguish from some mild joint or tendon swelling Knees full ROM no tenderness or swelling   CDAI Exam: CDAI Score: 17  Patient Global: 60 mm; Provider Global: 50 mm Swollen: 5 ; Tender: 1  Joint Exam 10/13/2020      Right  Left  Wrist  Swollen Tender     MCP 2  Swollen      MCP 3  Swollen      MCP 4  Swollen      MCP 5  Swollen         Investigation: No additional findings.  Imaging: No results found.  Recent Labs: Lab Results  Component Value Date   WBC 8.5 09/06/2020   HGB 10.2 (L) 09/06/2020   PLT 333 09/06/2020   NA 136 09/06/2020   K 3.9 09/06/2020   CL 96 (L) 09/06/2020   CO2 33 (H) 09/06/2020   GLUCOSE 116 (H) 09/06/2020   BUN 17 09/06/2020   CREATININE 0.92 09/06/2020   BILITOT 0.4 09/05/2020   ALKPHOS 68 09/05/2020   AST 23 09/05/2020   ALT 18 09/05/2020   PROT 6.6 09/20/2020   ALBUMIN 2.2 (L) 09/05/2020   CALCIUM 9.4 09/06/2020   GFRAA >60 01/29/2015   QFTBGOLDPLUS NEGATIVE 09/20/2020    Speciality Comments: No specialty comments available.  Procedures:  No procedures performed Allergies: Lithium; Tegretol  [carbamazepine]; Tricyclic antidepressants; Strawberry extract; Codeine; Cymbalta [duloxetine hcl]; Erythromycin; Lyrica [pregabalin]; Neurontin [gabapentin]; Rabeprazole sodium; Duraprep [antiseptic products, misc.]; Penicillins; and Tape   Assessment / Plan:     Visit Diagnoses: Extensor tenosynovitis of right wrist  Pain and swelling of right wrist - Plan: predniSONE (DELTASONE) 10 MG tablet  Symptoms be consistent with a seronegative inflammatory arthritis improving on the prednisone so far.  Recommend starting methotrexate 15 mg p.o. weekly and folic acid 1 mg daily.  Start prednisone taper down to 15 mg and then down to 10 mg after 2 weeks.  Discussed risks of medication including GI intolerance, pneumonitis, cytopenias and hepatotoxicity with need for frequent drug monitoring.  Rheumatoid factor positive - Plan: predniSONE (DELTASONE) 10 MG tablet  Low positive rheumatoid factor and negative on repeat would consider this is more of a seronegative problem.  Differential remains open but no other underlying cause has been identified on workup including joint aspirations labs and open surgery.   Orders: No orders of the defined types were placed in this encounter.  Meds ordered this encounter  Medications   predniSONE (DELTASONE) 10 MG tablet    Sig: Take 1.5 tablets (15 mg total) by mouth daily with breakfast for 14 days, THEN 1 tablet (10 mg total) daily with breakfast for 14 days.    Dispense:  40 tablet    Refill:  0   methotrexate (RHEUMATREX) 2.5 MG tablet    Sig: Take 6 tablets (15 mg total) by mouth once a week. Caution:Chemotherapy. Protect from light.    Dispense:  30 tablet    Refill:  0   folic acid (FOLVITE) 1 MG tablet    Sig: Take 1 tablet (1 mg total) by mouth daily.    Dispense:  90 tablet    Refill:  0      Follow-Up Instructions: Return in about 4 weeks (  around 11/10/2020) for RA new MTX/GC taper f/u 4wks.   Collier Salina, MD  Note - This record has  been created using [employer].  Chart creation errors have been sought, but may not always  have been located. Such creation errors do not reflect on  the standard of medical care.

## 2020-10-12 NOTE — Progress Notes (Signed)
HPI: Ms. Lori Orozco returns today for follow-up of right wrist extensor tenosynovectomy 09/01/2020.  Patient states that overall her wrist is improving she still has significant pain and takes occasional oxycodone due to stabbing pain.  She is icing the hand.  She has been had therapy working with her twice a week to work on range of motion of the hand.  She is currently on prednisone 20 mg daily.  She is seeing Dr. Dimple Casey who she states it feels that she has systemic inflammatory condition that is constantly tenosynovitis of the right wrist.  She is due to see Dr. Dimple Casey tomorrow.  Review of systems.  No fevers or chills.  Physical exam: Right wrist surgical incisions well-healed.  She has decreased edema compared to prior exam.  There is no abnormal warmth or erythema.  Sensation grossly intact throughout the hand.  She has very limited flexion of the fingers of the right hand.  She also has limited supination of the right forearm.  There is atrophy of the right hand wrist and forearm compared to the left upper extremity.  Impression: Right wrist tenosynovitis Status post right extensor tenosynovectomy  Plan: We we will have her begin outpatient physical therapy for range of motion strengthening of the right hand wrist and forearm.  Prescription is given for outpatient therapy today.  Like to see her back in 1 month sooner if there is any questions concerns.  Refill on her pain medications given today she is to use this sparingly.  Questions encouraged and answered.

## 2020-10-13 ENCOUNTER — Encounter: Payer: Self-pay | Admitting: Internal Medicine

## 2020-10-13 ENCOUNTER — Ambulatory Visit (INDEPENDENT_AMBULATORY_CARE_PROVIDER_SITE_OTHER): Payer: Medicare Other | Admitting: Internal Medicine

## 2020-10-13 ENCOUNTER — Ambulatory Visit: Payer: Medicare Other | Admitting: Internal Medicine

## 2020-10-13 VITALS — BP 166/85 | HR 83 | Ht 66.0 in | Wt 190.8 lb

## 2020-10-13 DIAGNOSIS — M25431 Effusion, right wrist: Secondary | ICD-10-CM | POA: Diagnosis not present

## 2020-10-13 DIAGNOSIS — R768 Other specified abnormal immunological findings in serum: Secondary | ICD-10-CM | POA: Diagnosis not present

## 2020-10-13 DIAGNOSIS — M65831 Other synovitis and tenosynovitis, right forearm: Secondary | ICD-10-CM | POA: Diagnosis not present

## 2020-10-13 DIAGNOSIS — M25531 Pain in right wrist: Secondary | ICD-10-CM

## 2020-10-13 MED ORDER — PREDNISONE 10 MG PO TABS: ORAL_TABLET | ORAL | 0 refills | Status: AC

## 2020-10-13 MED ORDER — FOLIC ACID 1 MG PO TABS: 1.0000 mg | ORAL_TABLET | Freq: Every day | ORAL | 0 refills | Status: DC

## 2020-10-13 MED ORDER — METHOTREXATE 2.5 MG PO TABS: 15.0000 mg | ORAL_TABLET | ORAL | 0 refills | Status: DC

## 2020-10-18 ENCOUNTER — Telehealth: Payer: Self-pay

## 2020-10-18 NOTE — Telephone Encounter (Signed)
faxed

## 2020-10-18 NOTE — Telephone Encounter (Signed)
Kaelyn with [facility] would like most recent office note faxed to [phone].  Cb# [phone].  Please advise.  Thank You.

## 2020-10-21 ENCOUNTER — Telehealth: Payer: Self-pay

## 2020-10-21 NOTE — Telephone Encounter (Signed)
This ok? 

## 2020-10-21 NOTE — Telephone Encounter (Signed)
Lori Orozco with physical therapy is requesting a script for a custom cock up wrist splint. Patient is s/p right wrist tenosynovectomy preformed on 09/01/2020.

## 2020-10-21 NOTE — Telephone Encounter (Signed)
Rx completed and faxed.  

## 2020-10-21 NOTE — Telephone Encounter (Signed)
Fax number : [phone]

## 2020-10-21 NOTE — Telephone Encounter (Signed)
Did she give you a fax # to send it to?

## 2020-11-02 ENCOUNTER — Other Ambulatory Visit: Payer: Self-pay | Admitting: Family Medicine

## 2020-11-03 ENCOUNTER — Other Ambulatory Visit: Payer: Self-pay

## 2020-11-03 ENCOUNTER — Ambulatory Visit (INDEPENDENT_AMBULATORY_CARE_PROVIDER_SITE_OTHER): Payer: Medicare Other

## 2020-11-03 DIAGNOSIS — Z1231 Encounter for screening mammogram for malignant neoplasm of breast: Secondary | ICD-10-CM

## 2020-11-09 ENCOUNTER — Ambulatory Visit (INDEPENDENT_AMBULATORY_CARE_PROVIDER_SITE_OTHER): Payer: Medicare Other | Admitting: Physician Assistant

## 2020-11-09 ENCOUNTER — Encounter: Payer: Self-pay | Admitting: Physician Assistant

## 2020-11-09 DIAGNOSIS — M65831 Other synovitis and tenosynovitis, right forearm: Secondary | ICD-10-CM

## 2020-11-09 NOTE — Progress Notes (Signed)
HPI: Ms. Lori Orozco returns today for follow-up of her right wrist extensor tenosynovitis.  She underwent a right wrist extensor tenosynovectomy on 09/01/2020.  She is currently on prednisone 10 mg daily which she was placed on by Dr. Dimple Casey.  She is going to physical therapy and they provided a custom cock up wrist splint for her.  She feels that she is somewhat improving.  She notes her swelling is going down.  She still having excessive amount of pain particularly over the dorsal distal ulna region.  She notes with supination of her forearm.  She has a sensation that something is getting stuck and has to give an order for her to fully supinate the hand.  She feels no definite popping but just feels as if something is getting stuck.  Review of systems: No fevers chills.  See HPI otherwise negative or noncontributory.  Physical exam: Right hand surgical incisions well-healed.  She has wrinkles on the dorsal aspect of her right hand.  Radial pulses 2+.  Hypersensitivity to light touch over the right dorsal ulnar region.  She is able to fully flex the thumb index and long finger.  She still has lack of full flexion of the fourth and fifth fingers.  She has full extension of the fingers throughout the right hand.  There is no dystrophic changes of the nails.  No rashes skin lesions ulcerations.  No abnormal warmth or erythema the right wrist.  Still with dorsal edema over the right wrist but this is greatly improved from prior visit.  She is able to almost fully supinate the hand but has to stop midway and then is able to proceed.   Impression: Right wrist tenosynovitis  Status post right wrist extensor tenosynovectomy  Plan: She will continue physical therapy work on range of motion strengthening right hand and wrist.  Otherwise she has no signs of complex regional pain syndrome outside of the hyper paresthesia we will start her on vitamin C 500 mg daily.  Vitamin C has been shown in some studies help prevent  complex regional pain syndrome.  We will also send her to Dr. Frazier Butt during our office to see if he has any recommendations.  She is happy for this referral.  Questions were encouraged and answered at length.  She will follow-up with [place] after Dr. Frazier Butt is appointment unless he feels that she could benefit from being under his care.

## 2020-11-11 ENCOUNTER — Encounter: Payer: Self-pay | Admitting: Orthopedic Surgery

## 2020-11-11 ENCOUNTER — Ambulatory Visit (INDEPENDENT_AMBULATORY_CARE_PROVIDER_SITE_OTHER): Payer: Medicare Other | Admitting: Orthopedic Surgery

## 2020-11-11 ENCOUNTER — Ambulatory Visit: Payer: Medicare Other | Admitting: Orthopedic Surgery

## 2020-11-11 ENCOUNTER — Ambulatory Visit (INDEPENDENT_AMBULATORY_CARE_PROVIDER_SITE_OTHER): Payer: Medicare Other

## 2020-11-11 ENCOUNTER — Other Ambulatory Visit: Payer: Self-pay

## 2020-11-11 VITALS — BP 138/78 | HR 73 | Ht 66.0 in | Wt 191.0 lb

## 2020-11-11 DIAGNOSIS — M65831 Other synovitis and tenosynovitis, right forearm: Secondary | ICD-10-CM | POA: Diagnosis not present

## 2020-11-11 NOTE — Progress Notes (Signed)
Office Visit Note   Patient: Lori Orozco           Date of Birth: Jul 15, 1950           MRN: [id number] Visit Date: 11/11/2020              Requested by: Tower, Audrie Gallus, MD [address] [place],  [place] [address] PCP: [name], MD   Assessment & Plan: Visit Diagnoses:  1. Extensor tenosynovitis of right wrist     Plan: Discussed with patient that she has a complex problem.  She is being followed by Dr. 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  She is also working with hand therapy on her pain, swelling, and range of motion.  She has been improving with medical treatment.  Her swelling, pain, and range of motion have improved since August.  We discussed surgical treatment options, including repeat synovectomy of the wrist and DRUJ, but because she is continuing to improve nonoperatively we will continue medical management.  She can see me again after continued medical and hand therapy.  Follow-Up Instructions: No follow-ups on file.   Orders:  Orders Placed This Encounter  Procedures   XR Wrist Complete Right   No orders of the defined types were placed in this encounter.     Procedures: No procedures performed   Clinical Data: No additional findings.   Subjective: Chief Complaint  Patient presents with   Right Hand - New Patient (Initial Visit)    This is a 70 year old right-hand-dominant female who presents with pain and swelling of her right wrist.  This all started back in June when she developed presumed extensor tenosynovitis.  She underwent right dorsal extensor tenosynovectomy back in June.  Intraoperative specimens that time were negative for infectious etiology.  When this all started she had excruciating pain to the slightest touch of her wrist with very significant swelling.  She has since been seen by rheumatology and has been taking prednisone and methotrexate.  She is also been  working on hand therapy on her range of motion.  Her pain and swelling have greatly improved since that initial surgery in June.  One of her biggest complaints today is limited supination with the feeling of a tendon locking or catching.  She feels that she gets caught when she goes from neutral to supination but is able to supinate to 30 degrees.  This is not painful.  She has been working on range of motion of her fingers and now is able to make a complete fist with the index and middle but still has difficulty with the ring and small fingers.  Her pronation supination has greatly improved with therapy.   Review of Systems  Constitutional: Negative.   Respiratory: Negative.    Cardiovascular: Negative.   Musculoskeletal:  Positive for joint swelling.  Skin: Negative.   Neurological: Negative.     Objective: Vital Signs: BP 138/78 (BP Location: Left Arm, Patient Position: Sitting, Cuff Size: Normal)   Pulse 73   Ht 5\' 6"  (1.676 m)   Wt 191 lb (86.6 kg)   SpO2 95%   BMI 30.83 kg/m   Physical Exam Constitutional:      Appearance: Normal appearance.  Cardiovascular:     Rate and Rhythm: Normal rate.     Pulses: Normal pulses.  Pulmonary:     Effort: Pulmonary effort is normal.  Skin:    General: Skin is warm  and dry.     Capillary Refill: Capillary refill takes 2 to 3 seconds.  Neurological:     General: No focal deficit present.     Mental Status: She is alert.    Right Hand Exam   Tenderness  The patient is experiencing tenderness in the ulnar area.  Muscle Strength  Wrist extension: 4/5  Wrist flexion: 4/5   Other  Erythema: absent Scars: present Sensation: normal Pulse: present  Comments:  Moderate swelling of the wrist.  TTP over ulnar head and DRUJ.  No pain w/ PROM of the wrist.  Supination to approx 30-40 degrees without palpable clunk or catching.  Able to make fist with index and middle fingers but lacks approx 3cm from palm with RF and 5cm with  SF.     Specialty Comments:  No specialty comments available.  Imaging: 3 views of the right wrist taken today are reviewed and interpreted by me.  They demonstrate ostepenia of the carpus w/ apparent osteolysis of the triquetrum.  The are degenerative changes in the radiocarpal, midcarpal, and ulnocarpal joints.   PMFS History: Patient Active Problem List   Diagnosis Date Noted   Bilateral knee pain 09/20/2020   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   High risk medication use 09/20/2020   Extensor tenosynovitis of right wrist 09/15/2020   Wrist pain 08/30/2020   Pain and swelling of right wrist 08/28/2020   S/P reverse total shoulder arthroplasty, 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   Lung nodule < 6cm on CT 01/02/2020   Rash and nonspecific skin eruption 12/17/2018   Fever 09/30/2018   Status post arthroscopy of left shoulder 01/16/2018   Estrogen deficiency 06/28/2017   Medial epicondylitis, left elbow 04/29/2017   S/P arthroscopy of left shoulder 02/25/2017   Impingement syndrome of left shoulder 01/30/2017   Pedal edema 10/26/2016   Trochanteric bursitis, right hip 04/10/2016   Essential hypertension 09/02/2015   Urge incontinence 07/27/2015   Obesity 04/28/2015   Need for hepatitis C screening test 04/26/2015   Screening for HIV (human immunodeficiency virus) 04/26/2015   Osteoarthritis of left knee 01/28/2015   Status post total left knee replacement 01/28/2015   Vitamin D deficiency 04/27/2014   Seasonal and perennial allergic rhinitis 06/17/2013   S/P total hip arthroplasty 03/20/2013   Medicare annual wellness visit, subsequent 11/18/2012   History of falling 11/18/2012   Routine general medical examination at a health care facility 11/10/2012   Lichen sclerosus et atrophicus of the vulva 07/29/2012   Osteopenia 10/19/2011   Other screening mammogram 10/19/2011   Hypothyroidism 10/19/2011   ANEMIA 10/11/2009   PERIPHERAL NEUROPATHY, FEET 08/05/2009   BACK PAIN  08/05/2009   HAND PAIN, BILATERAL 08/05/2009   TREMOR 03/09/2008   MENOPAUSAL SYNDROME 10/09/2007   DEPRESSION, MAJOR 05/20/2007   BIPOLAR AFFECTIVE DISORDER 05/20/2007   Generalized anxiety disorder 05/20/2007   PERSONALITY DISORDER 05/20/2007   ESOPHAGEAL SPASM 05/20/2007   HIATAL HERNIA 05/20/2007   AMAUROSIS FUGAX 05/07/2007   COPD mixed type (HCC) 03/27/2007   HYPERCHOLESTEROLEMIA, PURE 12/10/2006   SYMPTOM, SYNDROME, CHRONIC FATIGUE 12/10/2006   Past Medical History:  Diagnosis Date   Amaurosis fugax    Anemia    hx   Anxiety    Arthritis    Asthma    Cataract    Chronic fatigue syndrome    Chronic kidney disease    frequency, nephritis when 70 yrs old   COPD (chronic obstructive pulmonary disease) (HCC)    COVID-19    Depression  Diverticulitis    Emphysema of lung (HCC)    Fibromyalgia    GERD (gastroesophageal reflux disease)    occ   H/O hiatal hernia    History of bronchitis    Hyperlipidemia    Hypothyroidism    Interstitial cystitis    Irritable bowel syndrome    Lichen sclerosus    Lumbar herniated disc    Migraines    Pneumonia    PONV (postoperative nausea and vomiting)    Shortness of breath    exertion    Thyroid disease    Graves   Urinary frequency    Urinary tract infection    Vertigo     Family History  Problem Relation Age of Onset   Arthritis Mother    Hypertension Mother    Kidney disease Mother    Cancer Father        Bladder   Arthritis Sister    Colon cancer Other    Cancer - Lung Cousin    Arthritis Maternal Grandmother     Past Surgical History:  Procedure Laterality Date   ABDOMINAL HYSTERECTOMY     bladder sugery      CAROTID DOPPLAR     COLONOSCOPY  05/26/2010   avms- otherwise nl , re check 10y   DEXA-OSTEOPENIA     DOPPLER ECHOCARDIOGRAPHY     elbow surgery     EPICONDYLITIS     EYE SURGERY Bilateral    cataracts   FOOT SURGERY Bilateral    I & D EXTREMITY Right 09/01/2020   Procedure: TYNOSYNOVECTOMY;   Surgeon: [name], MD;  Location: [facility] OR;  Service: Orthopedics;  Laterality: Right;   KNEE SURGERY     Left cartilage   lipoma in second finger right hand     PLANTAR FASCIA SURGERY Left    REVERSE SHOULDER ARTHROPLASTY Left 02/02/2020   Procedure: LEFT REVERSE SHOULDER ARTHROPLASTY;  Surgeon: [name], MD;  Location: [facility];  Service: Orthopedics;  Laterality: Left;   ROTATOR CUFF REPAIR Left 12/2017   TEMPOROMANDIBULAR JOINT SURGERY     TONSILLECTOMY     TOTAL HIP ARTHROPLASTY Right 03/20/2013   Procedure: Right TOTAL HIP ARTHROPLASTY;  Surgeon: [name], MD;  Location: [facility] OR;  Service: Orthopedics;  Laterality: Right;  Right Total Hip Arthroplasty   TOTAL KNEE ARTHROPLASTY Left 01/28/2015   Procedure: LEFT TOTAL KNEE ARTHROPLASTY;  Surgeon: [name], MD;  Location: [facility];  Service: Orthopedics;  Laterality: Left;   TRIGGER FINGER RELEASE Right    TROCHANTERIC BURSA EXCISION Right 05/03/2016   Dr. Doneen Poisson   WRIST ARTHROSCOPY Right    ligament tear   Social History   Occupational History   Not on file  Tobacco Use   Smoking status: Former    Packs/day: 2.00    Years: 40.00    Pack years: 80.00    Types: E-cigarettes, Cigarettes   Smokeless tobacco: Never   Tobacco comments:    quit in 2015  Vaping Use   Vaping Use: Some days   Devices: no nicotine in it  Substance and Sexual Activity   Alcohol use: No    Alcohol/week: 0.0 standard drinks   Drug use: No   Sexual activity: Not Currently

## 2020-11-13 ENCOUNTER — Other Ambulatory Visit: Payer: Self-pay | Admitting: Internal Medicine

## 2020-11-14 NOTE — Progress Notes (Signed)
Office Visit Note  Patient: Lori Orozco             Date of Birth: 1950/05/26           MRN: [id number]             PCP: Abner Greenspan, MD Referring: Tower, Wynelle Fanny, MD Visit Date: 11/15/2020   Subjective:  Follow-up (Patient complains of continued pain, swelling, and stiffness in right hand/wrist. Patient's last dose of MTX 11/11/2020, patient was noticing dizziness and urinary retention as a side effect. )   History of Present Illness: Lori Orozco is a 70 y.o. female here for follow up with wrist wrist extensor tenosynovitis and presumed seronegative inflammatory arthritis also involving knee joints after starting methotrexate 15 mg PO weekly and tapering prednisone off. Her wrist is slightly improved but remains very painful and swollen with decreased mobility. After starting methotrexate she has noticed some episodes of dizziness and urinary retention and frequency. She stopped the prednisone without noticing much difference in symptoms so far.    Previous HPI 10/13/20 Lori Orozco is a 69 y.o. 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  Since her last visit wrist pain and swelling and redness continues with a large decrease in severity of the hand edema and skin changes.  She denies any particular symptomatic side effects of the prednisone.  Orthopedic surgery clinic follow-up without any postsurgical concerns reported.  She has been doing at home exercise with plans to go for formal OT outpatient.  09/20/20 Lori Orozco is a 70 y.o. female here for joint pain and swelling of knees and wrists with severe tenosynovitis s/p tenosynovectomy on 09/01/20 with Dr. Ninfa Linden.  She had been feeling in her usual health until June she recalls onset of symptoms after using a gas leaf blower at her home during which  time she experienced some mild right wrist pain.  She does not recall any particular injury event at this time.  However she experienced progressive ongoing increase in pain with swelling and stiffness in her right wrist especially with decreased range of motion and pain over the dorsal aspect.  This is evaluated at orthopedics clinic with aspiration that revealed just coagulated sample trial of steroid medication and only partially improved symptoms then continued worsening.  She was admitted to the hospital for tenosynovectomy that was performed without complication.  Intraoperatively reported extensive inflammatory tissue debridement without any evidence concerning for infection.  During this hospitalization she also developed knee effusion which was aspirated demonstrating inflammatory synovial fluid with negative microscopy and cultures.  Is now almost 3 weeks since her surgery she continues experiencing severe pain intensity swelling around the right hand and wrist and has developed some skin blistering.     Labs reviewed 08/2020 Synovial fluid knee 4,525 WBCs 90% neutrophils ANA neg RF 14.2 CCP neg CBC Hgb 10.2 CMP Albumin 2.2 TSH 1.584 UA neg ESR 37 CRP 14.1 Uric acid 4.9   07/2020 Synovial fluid wrist clotted sample negative gram stain     08/30/20 MRI Right wrist IMPRESSION: 1. Effusion and synovitis in the radiocarpal joint and also in the distal radioulnar joint. This may be secondary to inflammatory arthropathy, infection is not excluded. 2. Dorsal subcutaneous edema along the wrist, cellulitis is not excluded. 3. Poor definition of the distal TFCC cartilage in the vicinity  of the small ossicles along the ulnar styloid tip, query remote injury. 4. Extensor carpi ulnaris peritendinitis or tenosynovitis. 5. Subtle areas of subcortical marrow edema along the second, third, and fourth carpometacarpal articulations attributable to arthropathy.   Review of Systems  Constitutional:   Positive for fatigue.  HENT:  Negative for mouth sores, mouth dryness and nose dryness.   Eyes:  Positive for itching. Negative for pain, visual disturbance and dryness.  Respiratory:  Negative for cough, hemoptysis, shortness of breath and difficulty breathing.   Cardiovascular:  Negative for chest pain, palpitations and swelling in legs/feet.  Gastrointestinal:  Negative for abdominal pain, blood in stool, constipation and diarrhea.  Endocrine: Negative for increased urination.  Genitourinary:  Positive for difficulty urinating. Negative for painful urination.  Musculoskeletal:  Positive for joint pain, joint pain, joint swelling and morning stiffness. Negative for myalgias, muscle weakness, muscle tenderness and myalgias.  Skin:  Negative for color change, rash and redness.  Allergic/Immunologic: Negative for susceptible to infections.  Neurological:  Positive for dizziness. Negative for numbness, headaches, memory loss and weakness.  Hematological:  Negative for swollen glands.  Psychiatric/Behavioral:  Negative for confusion and sleep disturbance.    PMFS History:  Patient Active Problem List   Diagnosis Date Noted   Bilateral knee pain 09/20/2020   Seronegative rheumatoid arthritis ([place]) 09/20/2020   High risk medication use 09/20/2020   Extensor tenosynovitis of right wrist 09/15/2020   Wrist pain 08/30/2020   Pain and swelling of right wrist 08/28/2020   S/P reverse total shoulder arthroplasty, 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   Lung nodule < 6cm on CT 01/02/2020   Rash and nonspecific skin eruption 12/17/2018   Fever 09/30/2018   Status post arthroscopy of left shoulder 01/16/2018   Estrogen deficiency 06/28/2017   Medial epicondylitis, left elbow 04/29/2017   S/P arthroscopy of left shoulder 02/25/2017   Impingement syndrome of left shoulder 01/30/2017   Pedal edema 10/26/2016   Trochanteric bursitis, right hip 04/10/2016   Essential hypertension 09/02/2015   Urge incontinence 07/27/2015    Obesity 04/28/2015   Need for hepatitis C screening test 04/26/2015   Screening for HIV (human immunodeficiency virus) 04/26/2015   Osteoarthritis of left knee 01/28/2015   Status post total left knee replacement 01/28/2015   Vitamin D deficiency 04/27/2014   Seasonal and perennial allergic rhinitis 06/17/2013   S/P total hip arthroplasty 03/20/2013   Medicare annual wellness visit, subsequent 11/18/2012   History of falling 11/18/2012   Routine general medical examination at a health care facility 05/39/7673   Lichen sclerosus et atrophicus of the vulva 07/29/2012   Osteopenia 10/19/2011   Other screening mammogram 10/19/2011   Hypothyroidism 10/19/2011   ANEMIA 10/11/2009   PERIPHERAL NEUROPATHY, FEET 08/05/2009   BACK PAIN 08/05/2009   HAND PAIN, BILATERAL 08/05/2009   TREMOR 03/09/2008   MENOPAUSAL SYNDROME 10/09/2007   DEPRESSION, MAJOR 05/20/2007   BIPOLAR AFFECTIVE DISORDER 05/20/2007   Generalized anxiety disorder 05/20/2007   PERSONALITY DISORDER 05/20/2007   ESOPHAGEAL SPASM 05/20/2007   HIATAL HERNIA 05/20/2007   AMAUROSIS FUGAX 05/07/2007   COPD mixed type ([place]) 03/27/2007   HYPERCHOLESTEROLEMIA, PURE 12/10/2006   SYMPTOM, SYNDROME, CHRONIC FATIGUE 12/10/2006    Past Medical History:  Diagnosis Date   Amaurosis fugax    Anemia    hx   Anxiety    Arthritis    Asthma    Cataract    Chronic fatigue syndrome    Chronic kidney disease    frequency, nephritis when 70 yrs old  COPD (chronic obstructive pulmonary disease) ([place])    COVID-19    Depression    Diverticulitis    Emphysema of lung (HCC)    Fibromyalgia    GERD (gastroesophageal reflux disease)    occ   H/O hiatal hernia    History of bronchitis    Hyperlipidemia    Hypothyroidism    Interstitial cystitis    Irritable bowel syndrome    Lichen sclerosus    Lumbar herniated disc    Migraines    Pneumonia    PONV (postoperative nausea and vomiting)    Shortness of breath    exertion     Thyroid disease    Graves   Urinary frequency    Urinary tract infection    Vertigo     Family History  Problem Relation Age of Onset   Arthritis Mother    Hypertension Mother    Kidney disease Mother    Cancer Father        Bladder   Arthritis Sister    Colon cancer Other    Cancer - Lung Cousin    Arthritis Maternal Grandmother    Past Surgical History:  Procedure Laterality Date   ABDOMINAL HYSTERECTOMY     bladder sugery      CAROTID DOPPLAR     COLONOSCOPY  05/26/2010   avms- otherwise nl , re check 10y   DEXA-OSTEOPENIA     DOPPLER ECHOCARDIOGRAPHY     elbow surgery     EPICONDYLITIS     EYE SURGERY Bilateral    cataracts   FOOT SURGERY Bilateral    I & D EXTREMITY Right 09/01/2020   Procedure: TYNOSYNOVECTOMY;  Surgeon: Mcarthur Rossetti, MD;  Location: [facility];  Service: Orthopedics;  Laterality: Right;   KNEE SURGERY     Left cartilage   lipoma in second finger right hand     PLANTAR FASCIA SURGERY Left    REVERSE SHOULDER ARTHROPLASTY Left 02/02/2020   Procedure: LEFT REVERSE SHOULDER ARTHROPLASTY;  Surgeon: Meredith Pel, MD;  Location: [facility];  Service: Orthopedics;  Laterality: Left;   ROTATOR CUFF REPAIR Left 12/2017   TEMPOROMANDIBULAR JOINT SURGERY     TONSILLECTOMY     TOTAL HIP ARTHROPLASTY Right 03/20/2013   Procedure: Right TOTAL HIP ARTHROPLASTY;  Surgeon: Newt Minion, MD;  Location: [facility];  Service: Orthopedics;  Laterality: Right;  Right Total Hip Arthroplasty   TOTAL KNEE ARTHROPLASTY Left 01/28/2015   Procedure: LEFT TOTAL KNEE ARTHROPLASTY;  Surgeon: Mcarthur Rossetti, MD;  Location: [facility];  Service: Orthopedics;  Laterality: Left;   TRIGGER FINGER RELEASE Right    TROCHANTERIC BURSA EXCISION Right 05/03/2016   Dr. Jean Rosenthal   WRIST ARTHROSCOPY Right    ligament tear   Social History   Social History Narrative   Not on file   Immunization History  Administered Date(s) Administered   Influenza Split  11/19/2011, 11/02/2013   Influenza Whole 12/20/2006, 11/25/2008, 11/26/2009, 11/24/2010   Influenza, High Dose Seasonal PF 12/13/2016   Influenza,inj,Quad PF,6+ Mos 12/06/2015   Influenza-Unspecified 12/08/2012, 12/03/2014, 12/08/2017, 11/10/2018, 10/31/2019   Moderna Sars-Covid-2 Vaccination 06/21/2019, 07/19/2019, 01/22/2020   PFIZER(Purple Top)SARS-COV-2 Vaccination 06/04/2020   Pneumococcal Conjugate-13 12/06/2015   Pneumococcal Polysaccharide-23 01/26/2002, 03/23/2008, 09/07/2010, 06/28/2017   Td 06/26/2000, 10/19/2011   Zoster Recombinat (Shingrix) 09/16/2017     Objective: Vital Signs: BP (!) 149/83 (BP Location: Left Arm, Patient Position: Sitting, Cuff Size: Normal)   Pulse 93   Ht '5\' 7"'  (1.702 m)  Wt 194 lb (88 kg)   BMI 30.38 kg/m    Physical Exam Musculoskeletal:     Right lower leg: No edema.     Left lower leg: No edema.  Skin:    General: Skin is warm and dry.  Neurological:     General: No focal deficit present.     Mental Status: She is alert.  Psychiatric:        Mood and Affect: Mood normal.     Musculoskeletal Exam:  Shoulders full ROM no tenderness or swelling Elbows full ROM no tenderness or swelling Right wrist swelling, erythema, and warmth worse over dorsal aspect with decreased flexion and extension ROM, fingers ROM intact without significant synovitis Knees full ROM no tenderness or swelling   Investigation: No additional findings.  Imaging: MM 3D SCREEN BREAST BILATERAL  Result Date: 11/04/2020 CLINICAL DATA:  Screening. EXAM: DIGITAL SCREENING BILATERAL MAMMOGRAM WITH TOMOSYNTHESIS AND CAD TECHNIQUE: Bilateral screening digital craniocaudal and mediolateral oblique mammograms were obtained. Bilateral screening digital breast tomosynthesis was performed. The images were evaluated with computer-aided detection. COMPARISON:  Previous exam(s). ACR Breast Density Category b: There are scattered areas of fibroglandular density. FINDINGS: There are  no findings suspicious for malignancy. IMPRESSION: No mammographic evidence of malignancy. A result letter of this screening mammogram will be mailed directly to the patient. RECOMMENDATION: Screening mammogram in one year. (Code:SM-B-01Y) BI-RADS CATEGORY  1: Negative. Electronically Signed   By: Abelardo Diesel M.D.   On: 11/04/2020 14:21    Recent Labs: Lab Results  Component Value Date   WBC 8.5 09/06/2020   HGB 10.2 (L) 09/06/2020   PLT 333 09/06/2020   NA 136 09/06/2020   K 3.9 09/06/2020   CL 96 (L) 09/06/2020   CO2 33 (H) 09/06/2020   GLUCOSE 116 (H) 09/06/2020   BUN 17 09/06/2020   CREATININE 0.92 09/06/2020   BILITOT 0.4 09/05/2020   ALKPHOS 68 09/05/2020   AST 23 09/05/2020   ALT 18 09/05/2020   PROT 6.6 09/20/2020   ALBUMIN 2.2 (L) 09/05/2020   CALCIUM 9.4 09/06/2020   GFRAA >60 01/29/2015   QFTBGOLDPLUS NEGATIVE 09/20/2020    Speciality Comments: No specialty comments available.  Procedures:  No procedures performed Allergies: Lithium; Tegretol [carbamazepine]; Tricyclic antidepressants; Strawberry extract; Codeine; Cymbalta [duloxetine hcl]; Erythromycin; Lyrica [pregabalin]; Methotrexate derivatives; Neurontin [gabapentin]; Rabeprazole sodium; Duraprep [antiseptic products, misc.]; Penicillins; and Tape   Assessment / Plan:     Visit Diagnoses: Seronegative rheumatoid arthritis ([place]) - Plan: hydroxychloroquine (PLAQUENIL) 200 MG tablet, predniSONE (DELTASONE) 20 MG tablet  Suspected diagnosis so far unresponsive to initial trial of methotrexate and minimally to oral prednisone. Will discontinue methotrexate and start hydroxychloroquine 400 mg PO daily. This will take several weeks to assess response or not also recommend restarting prednisone at 40 mg PO daily for 10 days if helpful can taper otherwise discontinue. Discussed risks of medications including need for referral to ophthalmology if continuing HCQ past initial period.  Extensor tenosynovitis of right wrist  - Plan: hydroxychloroquine (PLAQUENIL) 200 MG tablet, predniSONE (DELTASONE) 20 MG tablet  Suspected to represent RA. If nonresponsive to steroid and second DMARD with seronegative disease question diagnosis and may need further management with hand surgery.  Orders: No orders of the defined types were placed in this encounter.  Meds ordered this encounter  Medications   hydroxychloroquine (PLAQUENIL) 200 MG tablet    Sig: Take 2 tablets (400 mg total) by mouth daily.    Dispense:  60 tablet  Refill:  1   predniSONE (DELTASONE) 20 MG tablet    Sig: Take 2 tablets (40 mg total) by mouth daily with breakfast for 10 days.    Dispense:  20 tablet    Refill:  0      Follow-Up Instructions: Return in about 8 weeks (around 01/10/2021).   Collier Salina, MD  Note - This record has been created using [employer].  Chart creation errors have been sought, but may not always  have been located. Such creation errors do not reflect on  the standard of medical care.

## 2020-11-15 ENCOUNTER — Encounter: Payer: Self-pay | Admitting: Internal Medicine

## 2020-11-15 ENCOUNTER — Other Ambulatory Visit: Payer: Self-pay

## 2020-11-15 ENCOUNTER — Ambulatory Visit (INDEPENDENT_AMBULATORY_CARE_PROVIDER_SITE_OTHER): Payer: Medicare Other | Admitting: Internal Medicine

## 2020-11-15 VITALS — BP 149/83 | HR 93 | Ht 67.0 in | Wt 194.0 lb

## 2020-11-15 DIAGNOSIS — M0600 Rheumatoid arthritis without rheumatoid factor, unspecified site: Secondary | ICD-10-CM | POA: Diagnosis not present

## 2020-11-15 DIAGNOSIS — M65831 Other synovitis and tenosynovitis, right forearm: Secondary | ICD-10-CM

## 2020-11-15 MED ORDER — PREDNISONE 20 MG PO TABS: 40.0000 mg | ORAL_TABLET | Freq: Every day | ORAL | 0 refills | Status: AC

## 2020-11-15 MED ORDER — HYDROXYCHLOROQUINE SULFATE 200 MG PO TABS: 400.0000 mg | ORAL_TABLET | Freq: Every day | ORAL | 1 refills | Status: DC

## 2020-11-16 ENCOUNTER — Encounter: Payer: Self-pay | Admitting: Obstetrics & Gynecology

## 2020-11-16 ENCOUNTER — Ambulatory Visit (INDEPENDENT_AMBULATORY_CARE_PROVIDER_SITE_OTHER): Payer: Medicare Other | Admitting: Obstetrics & Gynecology

## 2020-11-16 VITALS — BP 178/96 | HR 109 | Resp 16 | Ht 68.0 in | Wt 192.0 lb

## 2020-11-16 DIAGNOSIS — N904 Leukoplakia of vulva: Secondary | ICD-10-CM | POA: Diagnosis not present

## 2020-11-16 DIAGNOSIS — N959 Unspecified menopausal and perimenopausal disorder: Secondary | ICD-10-CM | POA: Diagnosis not present

## 2020-11-16 MED ORDER — ESTROGENS CONJUGATED 0.625 MG PO TABS: 0.6250 mg | ORAL_TABLET | Freq: Every day | ORAL | 12 refills | Status: DC

## 2020-11-16 MED ORDER — CLOBETASOL PROPIONATE 0.05 % EX CREA: TOPICAL_CREAM | CUTANEOUS | 4 refills | Status: DC

## 2020-11-16 NOTE — Progress Notes (Signed)
GYNECOLOGY OFFICE VISIT NOTE  History:   Lori Orozco is a 70 y.o. G0P0000 here today for follow up of lichen sclerosus and menopausal symptoms. Has not been able to use prescribed Temovate due to issues with her right hand for some months, also needs refills of her Temovate and Premarin. She denies any abnormal vaginal discharge, bleeding, pelvic pain or other concerns.    Past Medical History:  Diagnosis Date   Amaurosis fugax    Anemia    hx   Anxiety    Arthritis    Asthma    Cataract    Chronic fatigue syndrome    Chronic kidney disease    frequency, nephritis when 70 yrs old   COPD (chronic obstructive pulmonary disease) (HCC)    COVID-19    Depression    Diverticulitis    Emphysema of lung (HCC)    Fibromyalgia    GERD (gastroesophageal reflux disease)    occ   H/O hiatal hernia    History of bronchitis    Hyperlipidemia    Hypothyroidism    Interstitial cystitis    Irritable bowel syndrome    Lichen sclerosus    Lumbar herniated disc    Migraines    Pneumonia    PONV (postoperative nausea and vomiting)    Shortness of breath    exertion    Thyroid disease    Graves   Urinary frequency    Urinary tract infection    Vertigo     Past Surgical History:  Procedure Laterality Date   ABDOMINAL HYSTERECTOMY     bladder sugery      CAROTID DOPPLAR     COLONOSCOPY  05/26/2010   avms- otherwise nl , re check 10y   DEXA-OSTEOPENIA     DOPPLER ECHOCARDIOGRAPHY     elbow surgery     EPICONDYLITIS     EYE SURGERY Bilateral    cataracts   FOOT SURGERY Bilateral    I & D EXTREMITY Right 09/01/2020   Procedure: TYNOSYNOVECTOMY;  Surgeon: [name], MD;  Location: [facility] OR;  Service: Orthopedics;  Laterality: Right;   KNEE SURGERY     Left cartilage   lipoma in second finger right hand     PLANTAR FASCIA SURGERY Left    REVERSE SHOULDER ARTHROPLASTY Left 02/02/2020   Procedure: LEFT REVERSE SHOULDER ARTHROPLASTY;  Surgeon: [name],  MD;  Location: [facility] OR;  Service: Orthopedics;  Laterality: Left;   ROTATOR CUFF REPAIR Left 12/2017   TEMPOROMANDIBULAR JOINT SURGERY     TONSILLECTOMY     TOTAL HIP ARTHROPLASTY Right 03/20/2013   Procedure: Right TOTAL HIP ARTHROPLASTY;  Surgeon: [name], MD;  Location: [facility] OR;  Service: Orthopedics;  Laterality: Right;  Right Total Hip Arthroplasty   TOTAL KNEE ARTHROPLASTY Left 01/28/2015   Procedure: LEFT TOTAL KNEE ARTHROPLASTY;  Surgeon: [name], MD;  Location: [facility] ORS;  Service: Orthopedics;  Laterality: Left;   TRIGGER FINGER RELEASE Right    TROCHANTERIC BURSA EXCISION Right 05/03/2016   Dr. [name], [name]   WRIST ARTHROSCOPY Right    ligament tear    The following portions of the patient's history were reviewed and updated as appropriate: allergies, current medications, past family history, past medical history, past social history, past surgical history and problem list.   Health Maintenance: Normal mammogram on 11/03/2020. Recently completed Cologard testing.   Review of Systems:  Pertinent items noted in HPI and remainder of comprehensive ROS otherwise negative.  Physical Exam:  BP (!) 178/96   Pulse (!) 109   Resp 16   Ht 5\' 8"  (1.727 m)   Wt 192 lb (87.1 kg)   BMI 29.19 kg/m  CONSTITUTIONAL: Well-developed, well-nourished female in no acute distress.  SKIN: No rash noted. Not diaphoretic. No erythema. No pallor. NEUROLOGIC: Alert and oriented to person, place, and time. Normal muscle tone coordination. No cranial nerve deficit noted. PSYCHIATRIC: Normal mood and affect. Normal behavior. Normal judgment and thought content. CARDIOVASCULAR: Normal heart rate noted RESPIRATORY: Effort and breath sounds normal, no problems with respiration noted ABDOMEN: No masses noted. No other overt distention noted.   PELVIC: Atrophic external genitalia; atrophic urethral meatus. Architecture distorted and fusion of labia minora to majora. No discrete area of  concern. No abnormal discharge noted. Performed in the presence of a chaperone     Assessment and Plan:     1. Lichen sclerosus et atrophicus of the vulva  Stable on exam. Temovate refilled, patient will apply as needed. - clobetasol cream (TEMOVATE) 0.05 %; APPLY SMALL AMOUNT TO AFFECTED AREAS TWO TO THREE TIMES PER WEEK  Dispense: 30 g; Refill: 4  2. Postmenopausal symptoms Patient is aware of concerns of continued estrogen therapy, concerns about breast cancer, increased risk of clots (stroke, VTE etc). She is s/p hysterectomy.  She feels this helps her symptoms. Premarin refilled - estrogens, conjugated, (PREMARIN) 0.625 MG tablet; Take 1 tablet (0.625 mg total) by mouth daily. Take daily for 21 days then do not take for 7 days.  Dispense: 30 tablet; Refill: 12  Routine preventative health maintenance measures emphasized. Please refer to After Visit Summary for other counseling recommendations.   Return for any gynecologic concerns.    I spent 15 minutes dedicated to the care of this patient including pre-visit review of records, face to face time with the patient discussing her conditions and treatments and post visit orders.    , MD, FACOG Obstetrician & Gynecologist, [facility] for [facility]

## 2020-11-25 ENCOUNTER — Telehealth: Payer: Self-pay

## 2020-11-25 DIAGNOSIS — M5441 Lumbago with sciatica, right side: Secondary | ICD-10-CM

## 2020-11-25 DIAGNOSIS — G8929 Other chronic pain: Secondary | ICD-10-CM

## 2020-11-25 MED ORDER — BACLOFEN 10 MG PO TABS: 10.0000 mg | ORAL_TABLET | Freq: Three times a day (TID) | ORAL | 0 refills | Status: DC | PRN

## 2020-11-25 NOTE — Telephone Encounter (Signed)
Patient states she has been in the emergency room twice. Patient states she is having bilateral sciatic nerve pain. Patient states she is having pain in her right knee. Patient states her great toe on her left foot hurts as well. Patient states she has been given prescription for pain medication and prednisone. Patient is asking for a prescription for Flexeril to help. Patient states she is unable to get out of the bed. Please advise.

## 2020-11-25 NOTE — Telephone Encounter (Signed)
Okay for her to try adding baclofen 10mg  by mouth as needed for pain or spasticity with the sciatica up to once per 8 hours. Need to watch out for somnolence when combined with oral pain medications or take less frequently.

## 2020-11-25 NOTE — Telephone Encounter (Signed)
Patient advised for her to try adding baclofen 10mg  by mouth as needed for pain or spasticity with the sciatica up to once per 8 hours. Need to watch out for somnolence when combined with oral pain medications or take less frequently. Patient expressed understanding.

## 2020-11-25 NOTE — Telephone Encounter (Signed)
Patient called stating she has a couple of issues that she would like to discuss with Dr. Dimple Casey or his nurse directly.  Patient states that it isn't urgent, but requested a return call before the end of the day today.

## 2020-12-01 ENCOUNTER — Other Ambulatory Visit: Payer: Self-pay | Admitting: Physician Assistant

## 2020-12-01 ENCOUNTER — Other Ambulatory Visit: Payer: Self-pay

## 2020-12-01 ENCOUNTER — Telehealth: Payer: Self-pay | Admitting: Orthopaedic Surgery

## 2020-12-01 DIAGNOSIS — M545 Low back pain, unspecified: Secondary | ICD-10-CM

## 2020-12-01 DIAGNOSIS — M5441 Lumbago with sciatica, right side: Secondary | ICD-10-CM

## 2020-12-01 DIAGNOSIS — G8929 Other chronic pain: Secondary | ICD-10-CM

## 2020-12-01 MED ORDER — BACLOFEN 10 MG PO TABS: 10.0000 mg | ORAL_TABLET | Freq: Three times a day (TID) | ORAL | 0 refills | Status: DC | PRN

## 2020-12-01 MED ORDER — ONDANSETRON 4 MG PO TBDP: 4.0000 mg | ORAL_TABLET | Freq: Three times a day (TID) | ORAL | 0 refills | Status: DC | PRN

## 2020-12-01 MED ORDER — OXYCODONE HCL 5 MG PO TABS: 5.0000 mg | ORAL_TABLET | Freq: Four times a day (QID) | ORAL | 0 refills | Status: DC | PRN

## 2020-12-01 NOTE — Telephone Encounter (Signed)
Pt also wanted me to also tell you her big toe is swolllen

## 2020-12-01 NOTE — Telephone Encounter (Signed)
I called and advised pt of plan. She would like her oxy 5-325, baclofen and zofran sent to the [facility]

## 2020-12-01 NOTE — Telephone Encounter (Signed)
Pt called requesting a call back back from [facility] or Dr. [name]. Pt states she is in severe pain and need to be seen today. She needs an ambulance to bring her. She states she is unable to get out of bed. Please call this pt as soon as possible.

## 2020-12-06 ENCOUNTER — Telehealth: Payer: Self-pay | Admitting: *Deleted

## 2020-12-06 ENCOUNTER — Ambulatory Visit: Payer: Medicare Other | Admitting: Physician Assistant

## 2020-12-06 DIAGNOSIS — M1710 Unilateral primary osteoarthritis, unspecified knee: Secondary | ICD-10-CM | POA: Insufficient documentation

## 2020-12-06 NOTE — Telephone Encounter (Signed)
Received call from patient yesterday, 12/05/20 asking about assistance for transportation. She states she is in severe pain and unable to bear any weight. Unable to leave her home on her own at this point. She is supposed to be having a lumbar MRI ordered by Dr. Temple Pacini and she will need to come back after this is completed to review. She has an appt scheduled tomorrow, 12/06/20 and this has been canceled for our office. CM contacted [facility] today to see when they are going to schedule her for an MRI as ordered Stat last week. They stated they are trying to figure out arrangements since patient cannot bear weight and pivot on her own for MRI- also they only have 1 room that is W/C or stretcher compatible, so they are working on this. Will contact CM when scheduled so she can assist with scheduling non-emergent ambulance transport for patient, as this will be only way patient is able to have testing done or any further appts at this time.

## 2020-12-06 NOTE — Telephone Encounter (Signed)
Patient currently scheduled for MRI of Lumbar spine with [facility] for tomorrow, 12/07/20, at request of Dr. [name] with [facility]. Office RNCM has attempted to locate transportation for patient through [place], but because she lives in [place], they would not provide. Also attempted [facility] as well as [facility] for the MRI appointment. Neither can provide. Also contacted several fire departments to see if Lift assist through them could be utilized unsuccessfully. Patient is informing now that she is in severe pain, feels she is dehydrated, unable to get up out of bed to the rest room and is on the 2nd floor of her home with no way to get out of her home. Weakness in lower extremities is worsening and CM states only other thing to be done due to safety is contact EMS and proceed to the ER. She is not sure if she got into the car at this point, if she could even sit up or get out with assistance. Definitely feel that she is in an emergent situation and needs to be seen by EMS and possible transport to Emergency Department due to worsening pain,weakness.

## 2020-12-07 ENCOUNTER — Other Ambulatory Visit: Payer: Medicare Other

## 2020-12-08 ENCOUNTER — Telehealth: Payer: Self-pay

## 2020-12-08 DIAGNOSIS — I2699 Other pulmonary embolism without acute cor pulmonale: Secondary | ICD-10-CM | POA: Insufficient documentation

## 2020-12-08 DIAGNOSIS — Z86711 Personal history of pulmonary embolism: Secondary | ICD-10-CM | POA: Insufficient documentation

## 2020-12-08 NOTE — Telephone Encounter (Signed)
I spoke with Clydie Braun and she said that [facility] C[facility] is going to admit pt to hospital; pt has blood clots in her lungs; pt is to have a CT scan of back and hips and [place] of heart done also. Clydie Braun wanted to give Dr Milinda Antis an update. And Clydie Braun glad pt is going to be admitted. Sending note to Dr Milinda Antis and [place] CMA.

## 2020-12-08 NOTE — Telephone Encounter (Signed)
I spoke with Amedeo Plenty (DPR signed) pt is presently at [facility] in [place]; Clydie Braun said that pt is not admitted to hospital but is in ED observation; pt is in pain and when lays down has difficulty breathing per Clydie Braun. Clydie Braun said that ED personnel at [facility] said there is no reason to admit pt to hospital. Pt has had labs, CT of knee and Clydie Braun said [facility] Cords is supposed to schedule an MRI of back. Clydie Braun said that pt cannot come home because Clydie Braun is not able to take care of pt in her present condition. Clydie Braun said that pt is dehydrated also and ED has not given pt IV fluids. Clydie Braun said that ED will not transfer pt to [facility] and that Clydie Braun has spoken with [facility] and they will not take pt to [facility]. I advised Clydie Braun to ck with [facility] to see if they would take pt from [place] to [place] in [place]. Clydie Braun said that she will ck into that.Clydie Braun also said that "pt told  her if she had a gun she would end it all." Clydie Braun will let [facility] ED be aware of that statement. Clydie Braun will cb with update. Sending note to Dr Milinda Antis who is out of office this afternoon and [place] CMA. Will teams [place] also.

## 2020-12-08 NOTE — Telephone Encounter (Signed)
Lori Orozco called returning a phone call  Please call [phone]

## 2020-12-08 NOTE — Telephone Encounter (Signed)
Thanks for letting me know- I will watch for correspondence  

## 2020-12-30 LAB — HM COLONOSCOPY

## 2021-01-01 ENCOUNTER — Encounter: Payer: Self-pay | Admitting: Internal Medicine

## 2021-01-01 ENCOUNTER — Encounter: Payer: Self-pay | Admitting: Family Medicine

## 2021-01-02 ENCOUNTER — Telehealth: Payer: Self-pay | Admitting: Family Medicine

## 2021-01-02 NOTE — Telephone Encounter (Signed)
Pt roommate called wanting medical record sent from [facility] to the provider, so provider will know what kind of care to give the pt. Pt cannot make follow-up appt because she can not walk.

## 2021-01-03 DIAGNOSIS — Q2730 Arteriovenous malformation, site unspecified: Secondary | ICD-10-CM | POA: Insufficient documentation

## 2021-01-03 NOTE — Telephone Encounter (Signed)
Lori Orozco has returned the phone call. She said the nurse may return the call back whenever she gets the chance.

## 2021-01-03 NOTE — Telephone Encounter (Signed)
Left v/m for Clydie Braun (DPR signed) to call office for more information on phone note call on 01/02/21.

## 2021-01-04 NOTE — Telephone Encounter (Signed)
Unable to reach Clydie Braun by phone and left v/m requesting cb.

## 2021-01-05 ENCOUNTER — Ambulatory Visit: Payer: Medicare Other | Admitting: Orthopedic Surgery

## 2021-01-08 ENCOUNTER — Other Ambulatory Visit: Payer: Self-pay | Admitting: Internal Medicine

## 2021-01-09 NOTE — Telephone Encounter (Signed)
All of pt's records are in care everwhere and Dr. Milinda Antis has also sent pt a mychart message regarding this

## 2021-01-10 ENCOUNTER — Ambulatory Visit: Payer: Medicare Other | Admitting: Internal Medicine

## 2021-01-13 ENCOUNTER — Ambulatory Visit: Payer: Medicare Other | Admitting: Orthopedic Surgery

## 2021-01-28 ENCOUNTER — Telehealth: Payer: Self-pay | Admitting: Rheumatology

## 2021-01-28 NOTE — Telephone Encounter (Signed)
The patient called back.  She stated that she finished the prednisone taper,and  soon after she started having pain and swelling in her hands.  She is also experiencing pain in her bilateral elbows.  She stated that hydroxychloroquine is not working .  She will be in the nursing home for another 21 days.  She requested a prednisone taper.  Side effects of prednisone taper were discussed.  I spoke with the nurse Stacy and gave the order to start patient on prednisone 20 mg p.o. daily for 1 week and then taper by 5 mg every week.  I also advised the patient to schedule an appointment with Dr. Dimple Casey to discuss future treatment plan.  Patient voiced understanding. Pollyann Savoy, MD

## 2021-01-28 NOTE — Telephone Encounter (Signed)
I received a text from the answering service that the patient is having swelling in her hands and can not use her hands. I returned the patient's call but she did not pick up the phone. I left a voice mail for her to call back. Pollyann Savoy, MD

## 2021-01-31 ENCOUNTER — Telehealth: Payer: Self-pay | Admitting: Family Medicine

## 2021-01-31 NOTE — Telephone Encounter (Signed)
Pt notified of Dr. Royden Purl comments and verbalized understanding. Pt will be on a prolonged dose of steroids so will go ahead and get flu shot

## 2021-01-31 NOTE — Telephone Encounter (Signed)
It's better to get one off steroids just because her immune system works better without it. However, with this early and severe flu season I think she should go ahead and get the flu shot as soon as she can. If she is going to be on prolonged steroids -don't wait, go ahead and get it.

## 2021-01-31 NOTE — Telephone Encounter (Signed)
Pt callled stating that she is on steroids for arthritis. Pt is asking is ok to have a flu shot while on steroids. Please advise.

## 2021-02-01 ENCOUNTER — Ambulatory Visit: Payer: Medicare Other | Admitting: Internal Medicine

## 2021-02-02 ENCOUNTER — Ambulatory Visit (INDEPENDENT_AMBULATORY_CARE_PROVIDER_SITE_OTHER): Payer: Medicare Other | Admitting: Physician Assistant

## 2021-02-02 ENCOUNTER — Encounter: Payer: Self-pay | Admitting: Physician Assistant

## 2021-02-02 ENCOUNTER — Telehealth: Payer: Self-pay

## 2021-02-02 ENCOUNTER — Other Ambulatory Visit: Payer: Self-pay

## 2021-02-02 ENCOUNTER — Ambulatory Visit: Payer: Medicare Other

## 2021-02-02 ENCOUNTER — Ambulatory Visit (INDEPENDENT_AMBULATORY_CARE_PROVIDER_SITE_OTHER): Payer: Medicare Other

## 2021-02-02 DIAGNOSIS — M25561 Pain in right knee: Secondary | ICD-10-CM

## 2021-02-02 MED ORDER — LIDOCAINE HCL 1 % IJ SOLN
3.0000 mL | INTRAMUSCULAR | Status: AC | PRN
Start: 2021-02-02 — End: 2021-02-02
  Administered 2021-02-02: 3 mL

## 2021-02-02 MED ORDER — METHYLPREDNISOLONE ACETATE 40 MG/ML IJ SUSP
40.0000 mg | INTRAMUSCULAR | Status: AC | PRN
  Administered 2021-02-02: 40 mg via INTRA_ARTICULAR

## 2021-02-02 NOTE — Telephone Encounter (Signed)
Opened in error

## 2021-02-02 NOTE — Progress Notes (Signed)
Office Visit Note   Patient: Lori Orozco           Date of Birth: [birth year]           MRN: [id number] Visit Date: 02/02/2021              Requested by: Tower, Audrie Gallus, MD [address] [place],  [place] [address] PCP: [name], MD   Assessment & Plan: Visit Diagnoses:  1. Acute pain of right knee     Plan: She tolerated the injection well and aspiration of the right knee well today.  We will have her follow-up with Dr. Samuella Cota at her scheduled appointment on December 14.  In regards to her overall deconditioning weight loss anemia recommend that she see her primary care physician.  Ace bandage was applied to the right knee with a compression type bandage secondary to patient's bleeding after aspiration injection this is to be removed this evening progress note was filled out addressing this that was sent back with the patient to the skilled nursing facility.  She will follow-up with [place] as needed.  Follow-Up Instructions: Return if symptoms worsen or fail to improve.   Orders:  Orders Placed This Encounter  Procedures   Large Joint Inj   XR Knee 1-2 Views Right   No orders of the defined types were placed in this encounter.     Procedures: Large Joint Inj on 02/02/2021 5:04 PM Indications: pain Details: 22 G 1.5 in needle, superolateral approach  Arthrogram: No  Medications: 3 mL lidocaine 1 %; 40 mg methylPREDNISolone acetate 40 MG/ML Aspirate: 1 mL yellow and blood-tinged Outcome: tolerated well, no immediate complications Procedure, treatment alternatives, risks and benefits explained, specific risks discussed. Consent was given by the patient. Immediately prior to procedure a time out was called to verify the correct patient, procedure, equipment, support staff and site/side marked as required. Patient was prepped and draped in the usual sterile fashion.      Clinical Data: No additional findings.   Subjective: Chief Complaint  Patient  presents with   Right Knee - Pain    HPI Lori Orozco is well-known to our department service comes in today due to right knee pain.  Since she was last seen she has been hospitalized she reports for a month.  During that time she was diagnosed with a DVT right lower extremity and pulmonary embolism she is now on Eliquis.  She also was found to be anemic.  She presents today in a wheelchair which she states she has been in wheelchair for the last 4 weeks due to atrophy of her lower extremities.  She also reports 3 falls since we have last seen her and twisted her knee at least on 1 occasion.  She notes weight loss.  She has had an endoscopy and colonoscopy but states there is been no source for her anemia noted.  She is currently in rehab.  She was recently placed on prednisone by rheumatology.  She does have follow-up with a rheumatologist on December 14.  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  She has been in therapy for this.  She also underwent a synovectomy.  She has not been seeing her primary care physician since being discharged from the hospital.  Review of Systems Positive for weight loss, chills.   Objective: Vital Signs: There were no vitals taken for this visit.  Physical Exam  General: Frail-appearing female seated in wheelchair.  No acute distress. Site: Alert and oriented x3 Ortho Exam Right knee no abnormal warmth erythema.  Slight edema plus minus effusion.  Global tenderness unable to perform exam secondary to pain.  No gross instability with valgus varus stressing. Specialty Comments:  No specialty comments available.  Imaging: XR Knee 1-2 Views Right  Result Date: 02/02/2021 Right knee 2 views: Patient spells are taken supine.  Mild to moderate narrowing medial joint line.  Otherwise knee overall well-preserved.  No acute fractures bony abnormalities.    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   Other pulmonary embolism without acute cor pulmonale (HCC) 12/08/2020   Arthritis of knee 12/06/2020   Bilateral knee pain 09/20/2020   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   High risk medication use 09/20/2020   Extensor tenosynovitis of right wrist 09/15/2020   Wrist pain 08/30/2020   Pain and swelling of right wrist 08/28/2020   S/P reverse total shoulder arthroplasty, 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   Lung nodule < 6cm on CT 01/02/2020   Rash and nonspecific skin eruption 12/17/2018   Fever 09/30/2018   Status post arthroscopy of left shoulder 01/16/2018   Estrogen deficiency 06/28/2017   Medial epicondylitis, left elbow 04/29/2017   S/P arthroscopy of left shoulder 02/25/2017   Impingement syndrome of left shoulder 01/30/2017   Pedal edema 10/26/2016   Trochanteric bursitis, right hip 04/10/2016   Essential hypertension 09/02/2015   Urge incontinence 07/27/2015   Obesity 04/28/2015   Need for hepatitis C screening test 04/26/2015   Screening for HIV (human immunodeficiency virus) 04/26/2015   Osteoarthritis of left knee 01/28/2015   Status post total left knee replacement 01/28/2015   Vitamin D deficiency 04/27/2014   Seasonal and perennial allergic rhinitis 06/17/2013   S/P total hip arthroplasty 03/20/2013   Medicare annual wellness visit, subsequent 11/18/2012   History of falling 11/18/2012   Routine general medical examination at a health care facility 11/10/2012   Lichen sclerosus et atrophicus of the vulva 07/29/2012   Osteopenia 10/19/2011   Other screening mammogram 10/19/2011   Hypothyroidism 10/19/2011   ANEMIA 10/11/2009   PERIPHERAL NEUROPATHY, FEET 08/05/2009   Backache 08/05/2009   HAND PAIN, BILATERAL 08/05/2009   TREMOR 03/09/2008   MENOPAUSAL SYNDROME 10/09/2007   DEPRESSION, MAJOR 05/20/2007   BIPOLAR AFFECTIVE DISORDER 05/20/2007   Generalized anxiety disorder 05/20/2007    PERSONALITY DISORDER 05/20/2007   ESOPHAGEAL SPASM 05/20/2007   HIATAL HERNIA 05/20/2007   AMAUROSIS FUGAX 05/07/2007   COPD mixed type (HCC) 03/27/2007   HYPERCHOLESTEROLEMIA, PURE 12/10/2006   SYMPTOM, SYNDROME, CHRONIC FATIGUE 12/10/2006   Past Medical History:  Diagnosis Date   Amaurosis fugax    Anemia    hx   Anxiety    Arthritis    Asthma    Cataract    Chronic fatigue syndrome    Chronic kidney disease    frequency, nephritis when 70 yrs old   COPD (chronic obstructive pulmonary disease) (HCC)    COVID-19    Depression    Diverticulitis    Emphysema of lung (HCC)    Fibromyalgia    GERD (gastroesophageal reflux disease)    occ   H/O hiatal hernia    History of bronchitis    Hyperlipidemia    Hypothyroidism    Interstitial cystitis    Irritable bowel syndrome    Lichen sclerosus    Lumbar herniated disc    Migraines  Pneumonia    PONV (postoperative nausea and vomiting)    Shortness of breath    exertion    Thyroid disease    Graves   Urinary frequency    Urinary tract infection    Vertigo     Family History  Problem Relation Age of Onset   Arthritis Mother    Hypertension Mother    Kidney disease Mother    Cancer Father        Bladder   Arthritis Sister    Colon cancer Other    Cancer - Lung Cousin    Arthritis Maternal Grandmother     Past Surgical History:  Procedure Laterality Date   ABDOMINAL HYSTERECTOMY     bladder sugery      CAROTID DOPPLAR     COLONOSCOPY  05/26/2010   avms- otherwise nl , re check 10y   DEXA-OSTEOPENIA     DOPPLER ECHOCARDIOGRAPHY     elbow surgery     EPICONDYLITIS     EYE SURGERY Bilateral    cataracts   FOOT SURGERY Bilateral    I & D EXTREMITY Right 09/01/2020   Procedure: TYNOSYNOVECTOMY;  Surgeon: [name], MD;  Location: [facility] OR;  Service: Orthopedics;  Laterality: Right;   KNEE SURGERY     Left cartilage   lipoma in second finger right hand     PLANTAR FASCIA SURGERY Left    REVERSE  SHOULDER ARTHROPLASTY Left 02/02/2020   Procedure: LEFT REVERSE SHOULDER ARTHROPLASTY;  Surgeon: [name], MD;  Location[facility] OR;  Service: Orthopedics;  Laterality: Left;   ROTATOR CUFF REPAIR Left 12/2017   TEMPOROMANDIBULAR JOINT SURGERY     TONSILLECTOMY     TOTAL HIP ARTHROPLASTY Right 03/20/2013   Procedure: Right TOTAL HIP ARTHROPLASTY;  Surgeon: [name], MD;  Location: [facility] OR;  Service: Orthopedics;  Laterality: Right;  Right Total Hip Arthroplasty   TOTAL KNEE ARTHROPLASTY Left 01/28/2015   Procedure: LEFT TOTAL KNEE ARTHROPLASTY;  Surgeon: [name], MD;  Location: [facility];  Service: Orthopedics;  Laterality: Left;   TRIGGER FINGER RELEASE Right    TROCHANTERIC BURSA EXCISION Right 05/03/2016   Dr. Doneen Poisson   WRIST ARTHROSCOPY Right    ligament tear   Social History   Occupational History   Not on file  Tobacco Use   Smoking status: Former    Packs/day: 2.00    Years: 40.00    Pack years: 80.00    Types: E-cigarettes, Cigarettes   Smokeless tobacco: Never   Tobacco comments:    quit in 2015  Vaping Use   Vaping Use: Some days   Devices: no nicotine in it  Substance and Sexual Activity   Alcohol use: No    Alcohol/week: 0.0 standard drinks   Drug use: No   Sexual activity: Not Currently

## 2021-02-07 ENCOUNTER — Ambulatory Visit: Payer: Medicare Other | Admitting: Family Medicine

## 2021-02-07 NOTE — Progress Notes (Signed)
Office Visit Note  Patient: Lori Orozco             Date of Birth: 1950-06-26           MRN: [id number]             PCP: Abner Greenspan, MD Referring: Tower, Wynelle Fanny, MD Visit Date: 02/08/2021   Subjective:  Follow-up (Not doing well)   History of Present Illness: Lori Orozco is a 70 y.o. 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  She has had major medical events since her last visit.  She developed increased knee pain and swelling on the right side.  However she went to the hospital due to developing more weakness and shortness of breath CT angiogram identified bilateral pulmonary emboli.  Treatment with anticoagulation led to significant gastrointestinal bleeding from previously unknown AVM.  This was treated also in the hospital but overall protracted complications lasted for about a month hospitalization.  During this time she experienced worsening joint pain and swelling and significant deconditioning and weakness.  She was discharged from the hospital to rehab facility she was not continued on any steroid or other anti-inflammatory treatment reports joint pain and swelling affecting numerous joints in bilateral elbows wrists fingers and knees.  This is limited any significant progress with physical therapy so far.  She called back to clinic earlier this month with the symptoms and was restarted on a prednisone taper currently down to 15 mg daily.  She saw orthopedics clinic for right knee aspiration injection 2 days ago that is partially helping.  Previous HPI 11/15/20 Lori Orozco is a 70 y.o. female here for follow up with wrist wrist extensor tenosynovitis and presumed seronegative inflammatory arthritis also involving knee joints after starting methotrexate 15 mg PO weekly and tapering prednisone off. Her wrist is slightly improved but remains very painful and swollen with decreased mobility. After  starting methotrexate she has noticed some episodes of dizziness and urinary retention and frequency. She stopped the prednisone without noticing much difference in symptoms so far.     09/20/20 Lori Orozco is a 70 y.o. female here for joint pain and swelling of knees and wrists with severe tenosynovitis s/p tenosynovectomy on 09/01/20 with Dr. Ninfa Linden.  She had been feeling in her usual health until June she recalls onset of symptoms after using a gas leaf blower at her home during which time she experienced some mild right wrist pain.  She does not recall any particular injury event at this time.  However she experienced progressive ongoing increase in pain with swelling and stiffness in her right wrist especially with decreased range of motion and pain over the dorsal aspect.  This is evaluated at orthopedics clinic with aspiration that revealed just coagulated sample trial of steroid medication and only partially improved symptoms then continued worsening.  She was admitted to the hospital for tenosynovectomy that was performed without complication.  Intraoperatively reported extensive inflammatory tissue debridement without any evidence concerning for infection.  During this hospitalization she also developed knee effusion which was aspirated demonstrating inflammatory synovial fluid with negative microscopy and cultures.  Is now almost 3 weeks since her surgery she continues experiencing severe pain intensity swelling around the right hand and wrist and has developed some skin blistering.     Labs reviewed 08/2020 Synovial fluid knee 4,525 WBCs 90% neutrophils ANA neg RF 14.2 CCP neg ESR 37 CRP  14.1 Uric acid 4.9   07/2020 Synovial fluid wrist clotted sample negative gram stain   Review of Systems  Constitutional:  Positive for fatigue.  HENT:  Positive for mouth dryness.   Eyes:  Positive for dryness.  Respiratory:  Positive for shortness of breath.   Cardiovascular:  Positive for  swelling in legs/feet.  Gastrointestinal:  Positive for diarrhea.  Endocrine: Positive for cold intolerance, excessive thirst and increased urination.  Genitourinary:  Positive for difficulty urinating and painful urination.  Musculoskeletal:  Positive for joint pain, gait problem, joint pain, joint swelling, muscle weakness, morning stiffness and muscle tenderness.  Skin:  Negative for rash.  Allergic/Immunologic: Negative for susceptible to infections.  Neurological:  Positive for numbness and weakness.  Hematological:  Positive for bruising/bleeding tendency.  Psychiatric/Behavioral:  Negative for sleep disturbance.    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   Other pulmonary embolism without acute cor pulmonale ([place]) 12/08/2020   Arthritis of knee 12/06/2020   Bilateral knee pain 09/20/2020   Seronegative rheumatoid arthritis ([place]) 09/20/2020   High risk medication use 09/20/2020   Extensor tenosynovitis of right wrist 09/15/2020   Wrist pain 08/30/2020   Pain and swelling of right wrist 08/28/2020   S/P reverse total shoulder arthroplasty, 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   Lung nodule < 6cm on CT 01/02/2020   Rash and nonspecific skin eruption 12/17/2018   Fever 09/30/2018   Status post arthroscopy of left shoulder 01/16/2018   Estrogen deficiency 06/28/2017   Medial epicondylitis, left elbow 04/29/2017   S/P arthroscopy of left shoulder 02/25/2017   Impingement syndrome of left shoulder 01/30/2017   Pedal edema 10/26/2016   Trochanteric bursitis, right hip 04/10/2016   Essential hypertension 09/02/2015   Urge incontinence 07/27/2015   Obesity 04/28/2015   Need for hepatitis C screening test 04/26/2015   Screening for HIV (human immunodeficiency virus) 04/26/2015   Osteoarthritis of left knee 01/28/2015   Status post total left knee replacement 01/28/2015   Vitamin D deficiency 04/27/2014   Seasonal and perennial  allergic rhinitis 06/17/2013   S/P total hip arthroplasty 03/20/2013   Medicare annual wellness visit, subsequent 11/18/2012   History of falling 11/18/2012   Routine general medical examination at a health care facility 75/88/3254   Lichen sclerosus et atrophicus of the vulva 07/29/2012   Osteopenia 10/19/2011   Other screening mammogram 10/19/2011   Hypothyroidism 10/19/2011   ANEMIA 10/11/2009   PERIPHERAL NEUROPATHY, FEET 08/05/2009   Backache 08/05/2009   HAND PAIN, BILATERAL 08/05/2009   TREMOR 03/09/2008   MENOPAUSAL SYNDROME 10/09/2007   DEPRESSION, MAJOR 05/20/2007   BIPOLAR AFFECTIVE DISORDER 05/20/2007   Generalized anxiety disorder 05/20/2007   PERSONALITY DISORDER 05/20/2007   ESOPHAGEAL SPASM 05/20/2007   HIATAL HERNIA 05/20/2007   AMAUROSIS FUGAX 05/07/2007   COPD mixed type ([place]) 03/27/2007   HYPERCHOLESTEROLEMIA, PURE 12/10/2006   SYMPTOM, SYNDROME, CHRONIC FATIGUE 12/10/2006    Past Medical History:  Diagnosis Date   Amaurosis fugax    Anemia    hx   Anxiety    Arthritis    Asthma    Cataract    Chronic fatigue syndrome    Chronic kidney disease    frequency, nephritis when 70 yrs old   COPD (chronic obstructive pulmonary disease) ([place])    COVID-19    Depression    Diverticulitis    Emphysema of lung ([facility])    Fibromyalgia    GERD (gastroesophageal reflux disease)  occ   H/O hiatal hernia    History of bronchitis    Hyperlipidemia    Hypothyroidism    Interstitial cystitis    Irritable bowel syndrome    Lichen sclerosus    Lumbar herniated disc    Migraines    Pneumonia    PONV (postoperative nausea and vomiting)    Shortness of breath    exertion    Thyroid disease    [place]   Urinary frequency    Urinary tract infection    Vertigo     Family History  Problem Relation Age of Onset   Arthritis Mother    Hypertension Mother    Kidney disease Mother    Cancer Father        Bladder   Arthritis Sister    Colon cancer Other     Cancer - Lung Cousin    Arthritis Maternal Grandmother    Past Surgical History:  Procedure Laterality Date   ABDOMINAL HYSTERECTOMY     bladder sugery      CAROTID DOPPLAR     COLONOSCOPY  05/26/2010   avms- otherwise nl , re check 10y   DEXA-OSTEOPENIA     DOPPLER ECHOCARDIOGRAPHY     elbow surgery     EPICONDYLITIS     EYE SURGERY Bilateral    cataracts   FOOT SURGERY Bilateral    I & D EXTREMITY Right 09/01/2020   Procedure: TYNOSYNOVECTOMY;  Surgeon: Mcarthur Rossetti, MD;  Location: [facility];  Service: Orthopedics;  Laterality: Right;   KNEE SURGERY     Left cartilage   lipoma in second finger right hand     PLANTAR FASCIA SURGERY Left    REVERSE SHOULDER ARTHROPLASTY Left 02/02/2020   Procedure: LEFT REVERSE SHOULDER ARTHROPLASTY;  Surgeon: Meredith Pel, MD;  Location: [facility];  Service: Orthopedics;  Laterality: Left;   ROTATOR CUFF REPAIR Left 12/2017   TEMPOROMANDIBULAR JOINT SURGERY     TONSILLECTOMY     TOTAL HIP ARTHROPLASTY Right 03/20/2013   Procedure: Right TOTAL HIP ARTHROPLASTY;  Surgeon: Newt Minion, MD;  Location: [facility];  Service: Orthopedics;  Laterality: Right;  Right Total Hip Arthroplasty   TOTAL KNEE ARTHROPLASTY Left 01/28/2015   Procedure: LEFT TOTAL KNEE ARTHROPLASTY;  Surgeon: Mcarthur Rossetti, MD;  Location: [facility];  Service: Orthopedics;  Laterality: Left;   TRIGGER FINGER RELEASE Right    TROCHANTERIC BURSA EXCISION Right 05/03/2016   Dr. Jean Rosenthal   WRIST ARTHROSCOPY Right    ligament tear   Social History   Social History Narrative   Not on file   Immunization History  Administered Date(s) Administered   Influenza Split 11/19/2011, 11/02/2013   Influenza Whole 12/20/2006, 11/25/2008, 11/26/2009, 11/24/2010   Influenza, High Dose Seasonal PF 12/13/2016   Influenza,inj,Quad PF,6+ Mos 12/06/2015   Influenza-Unspecified 12/08/2012, 12/03/2014, 12/08/2017, 11/10/2018, 10/31/2019   Moderna Sars-Covid-2 Vaccination  06/21/2019, 07/19/2019, 01/22/2020   PFIZER(Purple Top)SARS-COV-2 Vaccination 06/04/2020   Pneumococcal Conjugate-13 12/06/2015   Pneumococcal Polysaccharide-23 01/26/2002, 03/23/2008, 09/07/2010, 06/28/2017   Td 06/26/2000, 10/19/2011   Zoster Recombinat (Shingrix) 09/16/2017     Objective: Vital Signs: BP 112/69 (BP Location: Left Arm, Patient Position: Sitting, Cuff Size: Normal)    Pulse (!) 118    Resp 20    Physical Exam Constitutional:      Appearance: She is ill-appearing.  Cardiovascular:     Rate and Rhythm: Normal rate and regular rhythm.  Pulmonary:     Effort: Pulmonary effort is normal.  Breath sounds: Normal breath sounds.  Musculoskeletal:     Right lower leg: Edema present.     Left lower leg: Edema present.  Skin:    General: Skin is warm and dry.  Neurological:     Mental Status: She is alert.     Musculoskeletal Exam:  Shoulders abduction range of motion limited by stiffness and weakness no palpable swelling Bilateral elbow extension reduced left side tenderness and swelling, right forearm severe muscle atrophy present Bilateral wrist pain and swelling more extensive swelling on the left side right side with more atrophy and contracture Fingers on right hand contracted in largely extended position tender to pressure without focal synovitis, left hand tenderness and swelling over MCP joints range movement intact Bilateral knee swelling and tenderness slightly decreased extension of flexion range of motion Ankle range of motion decreased, tenderness anteriorly Severe pitting pedal edema below the ankles both sides   CDAI Exam: CDAI Score: 33  Patient Global: 90 mm; Provider Global: 80 mm Swollen: 8 ; Tender: 8  Joint Exam 02/08/2021      Right  Left  Elbow     Swollen Tender  Wrist  Swollen Tender  Swollen Tender  MCP 1  Swollen Tender     MCP 2     Swollen Tender  MCP 3     Swollen Tender  Knee  Swollen Tender  Swollen Tender      Investigation: No additional findings.  Imaging: XR Knee 1-2 Views Right  Result Date: 02/02/2021 Right knee 2 views: Patient spells are taken supine.  Mild to moderate narrowing medial joint line.  Otherwise knee overall well-preserved.  No acute fractures bony abnormalities.   Recent Labs: Lab Results  Component Value Date   WBC 8.5 09/06/2020   HGB 10.2 (L) 09/06/2020   PLT 333 09/06/2020   NA 136 09/06/2020   K 3.9 09/06/2020   CL 96 (L) 09/06/2020   CO2 33 (H) 09/06/2020   GLUCOSE 116 (H) 09/06/2020   BUN 17 09/06/2020   CREATININE 0.92 09/06/2020   BILITOT 0.4 09/05/2020   ALKPHOS 68 09/05/2020   AST 23 09/05/2020   ALT 18 09/05/2020   PROT 6.6 09/20/2020   ALBUMIN 2.2 (L) 09/05/2020   CALCIUM 9.4 09/06/2020   GFRAA >60 01/29/2015   QFTBGOLDPLUS NEGATIVE 09/20/2020    Speciality Comments: No specialty comments available.  Procedures:  No procedures performed Allergies: Lithium; Tegretol [carbamazepine]; Tricyclic antidepressants; Methotrexate derivatives; Strawberry extract; Codeine; Cymbalta [duloxetine hcl]; Erythromycin; Lyrica [pregabalin]; Neurontin [gabapentin]; Rabeprazole sodium; Duraprep [antiseptic products, misc.]; Penicillins; and Tape   Assessment / Plan:     Visit Diagnoses: Seronegative rheumatoid arthritis ([place]) - Plan: CBC with Differential/Platelet, COMPLETE METABOLIC PANEL WITH GFR, Rheumatoid factor, Sedimentation rate, C-reactive protein  Now presenting with much more extensive joint inflammation compared to before numerous swollen joints joint contractures and restricted range of motion.  Not having a very Complete response even on moderate prednisone dose.  Recheck CBC and CMP also rheumatoid factor and inflammatory markers today.  Lack of response to hydroxychloroquine intolerance to methotrexate will recommend starting Enbrel for what looks now like highly active seropositive RA.  Discussed risks of medications she previously had  recurrent UTIs on immunosuppression we will just have to monitor for this previous baseline work-up a few months ago without contraindication.  Chronic pain of both knees  Knee pain and swelling doing much worse she did have a fall describes a possible twisting or popping type injury of the knee  with this so possible traumatic effusions as well.  She is very deconditioned currently in wheelchair due to acute illness and prolonged hospitalization.    Orders: Orders Placed This Encounter  Procedures   CBC with Differential/Platelet   COMPLETE METABOLIC PANEL WITH GFR   Rheumatoid factor   Sedimentation rate   C-reactive protein    No orders of the defined types were placed in this encounter.    Follow-Up Instructions: No follow-ups on file.   Collier Salina, MD  Note - This record has been created using [employer].  Chart creation errors have been sought, but may not always  have been located. Such creation errors do not reflect on  the standard of medical care.

## 2021-02-08 ENCOUNTER — Other Ambulatory Visit: Payer: Self-pay

## 2021-02-08 ENCOUNTER — Encounter: Payer: Self-pay | Admitting: Internal Medicine

## 2021-02-08 ENCOUNTER — Ambulatory Visit (INDEPENDENT_AMBULATORY_CARE_PROVIDER_SITE_OTHER): Payer: Medicare Other | Admitting: Internal Medicine

## 2021-02-08 VITALS — BP 112/69 | HR 118 | Resp 20

## 2021-02-08 DIAGNOSIS — I2699 Other pulmonary embolism without acute cor pulmonale: Secondary | ICD-10-CM | POA: Diagnosis not present

## 2021-02-08 DIAGNOSIS — M25562 Pain in left knee: Secondary | ICD-10-CM | POA: Diagnosis not present

## 2021-02-08 DIAGNOSIS — M25561 Pain in right knee: Secondary | ICD-10-CM | POA: Diagnosis not present

## 2021-02-08 DIAGNOSIS — M0600 Rheumatoid arthritis without rheumatoid factor, unspecified site: Secondary | ICD-10-CM | POA: Diagnosis not present

## 2021-02-08 DIAGNOSIS — G8929 Other chronic pain: Secondary | ICD-10-CM

## 2021-02-08 NOTE — Patient Instructions (Signed)
Plan to f/u for new medication start

## 2021-02-09 ENCOUNTER — Other Ambulatory Visit (HOSPITAL_COMMUNITY): Payer: Self-pay

## 2021-02-09 ENCOUNTER — Telehealth: Payer: Self-pay | Admitting: Pharmacist

## 2021-02-09 ENCOUNTER — Telehealth: Payer: Self-pay | Admitting: Family Medicine

## 2021-02-09 LAB — COMPLETE METABOLIC PANEL WITH GFR
AG Ratio: 0.7 (calc) — ABNORMAL LOW (ref 1.0–2.5)
ALT: 23 U/L (ref 6–29)
AST: 44 U/L — ABNORMAL HIGH (ref 10–35)
Albumin: 2.8 g/dL — ABNORMAL LOW (ref 3.6–5.1)
Alkaline phosphatase (APISO): 99 U/L (ref 37–153)
BUN/Creatinine Ratio: 22 (calc) (ref 6–22)
BUN: 24 mg/dL (ref 7–25)
CO2: 29 mmol/L (ref 20–32)
Calcium: 9.2 mg/dL (ref 8.6–10.4)
Chloride: 94 mmol/L — ABNORMAL LOW (ref 98–110)
Creat: 1.09 mg/dL — ABNORMAL HIGH (ref 0.60–1.00)
Globulin: 4.2 g/dL (calc) — ABNORMAL HIGH (ref 1.9–3.7)
Glucose, Bld: 111 mg/dL — ABNORMAL HIGH (ref 65–99)
Potassium: 4.4 mmol/L (ref 3.5–5.3)
Sodium: 135 mmol/L (ref 135–146)
Total Bilirubin: 0.3 mg/dL (ref 0.2–1.2)
Total Protein: 7 g/dL (ref 6.1–8.1)
eGFR: 55 mL/min/{1.73_m2} — ABNORMAL LOW (ref >=60)

## 2021-02-09 LAB — C-REACTIVE PROTEIN: CRP: 306.2 mg/L — ABNORMAL HIGH (ref <8.0)

## 2021-02-09 LAB — CBC WITH DIFFERENTIAL/PLATELET
Absolute Monocytes: 1222 cells/uL — ABNORMAL HIGH (ref 200–950)
Basophils Absolute: 24 cells/uL (ref 0–200)
Basophils Relative: 0.2 %
Eosinophils Absolute: 0 cells/uL — ABNORMAL LOW (ref 15–500)
Eosinophils Relative: 0 %
HCT: 27.3 % — ABNORMAL LOW (ref 35.0–45.0)
Hemoglobin: 8.4 g/dL — ABNORMAL LOW (ref 11.7–15.5)
Lymphs Abs: 3158 cells/uL (ref 850–3900)
MCH: 27.5 pg (ref 27.0–33.0)
MCHC: 30.8 g/dL — ABNORMAL LOW (ref 32.0–36.0)
MCV: 89.5 fL (ref 80.0–100.0)
MPV: 9.6 fL (ref 7.5–12.5)
Monocytes Relative: 10.1 %
Neutro Abs: 7696 cells/uL (ref 1500–7800)
Neutrophils Relative %: 63.6 %
Platelets: 635 10*3/uL — ABNORMAL HIGH (ref 140–400)
RBC: 3.05 10*6/uL — ABNORMAL LOW (ref 3.80–5.10)
RDW: 15.1 % — ABNORMAL HIGH (ref 11.0–15.0)
Total Lymphocyte: 26.1 %
WBC: 12.1 10*3/uL — ABNORMAL HIGH (ref 3.8–10.8)

## 2021-02-09 LAB — SEDIMENTATION RATE: Sed Rate: 2 mm/h (ref 0–30)

## 2021-02-09 LAB — RHEUMATOID FACTOR: Rheumatoid fact SerPl-aCnc: <14 IU/mL (ref <14)

## 2021-02-09 NOTE — Telephone Encounter (Addendum)
Please start Enbrel SURECLICK AUTOINJECTOR BIV.  Dose: 50mg  SQ every 7 days  Dx: Rheumatoid arthritis (M05.9)  Previously tried therapies: Hydroxychloroquine - no clinical response Methotrexate - dizziness and urinary retention and frequency  Therapies patient unable to try: Jak inhibitors - Rinvoq, Olumiant, Xeljanz due to history of PE (these meds have risk of MACE) COPD - caution warranted with Orencia  Patient has Medicare and will likely need PAP - patient portion mailed to her home today. She will mail back (unable to drop off). Provider portion placed in Dr. folder with insurance card copy and med list  to be signed  Patient will not have new start appointment d/t transportation and mobility issues- reviewed in detail the risk for reactions and when seeking emergent care is warranted including anaphylactic reaction. Discussed signs and symptoms of anaphylactic reaction and that the risk is very rare with Enbrel. Reviewed that injection site reaction does not warrant seeing emergent care but can be managed with liberally icing the area and OTC hydrocortisone cream.   Gregary Cromer, PharmD, MPH, BCPS Clinical Pharmacist (Rheumatology and Pulmonology)  ----- Message from Chesley Mires, MD sent at 02/09/2021  7:53 AM EST ----- Recommending Enbrel start for highly active seronegative arthritis. RF previously positive now negative. Baseline labs were checked previously and normal. No improvement to hydroxychloroquine and side effects with methotrexate.

## 2021-02-09 NOTE — Progress Notes (Signed)
Recommending Enbrel start for highly active seronegative arthritis. RF previously positive now negative. Baseline labs were checked previously and normal. No improvement to hydroxychloroquine and side effects with methotrexate.

## 2021-02-09 NOTE — Telephone Encounter (Addendum)
Received notification from [facility] regarding a prior authorization for ENBREL. Authorization has been APPROVED from 02/27/2020 to 02/25/2022. Approval letter sent to scan center.  Per test claim, copay for 28 days supply is $188.76  Patient can fill through [facility]: [phone]   Authorization # [id number]

## 2021-02-09 NOTE — Telephone Encounter (Signed)
Submitted a Prior Authorization request to [facility] for ENBREL via CoverMyMeds. Will update once we receive a response.   Key: [id number]

## 2021-02-09 NOTE — Telephone Encounter (Signed)
Home Health verbal orders Caller Name:Terry Agency Name: [facility]  Callback number: [phone]  Requesting OT/PT/Skilled nursing/Social Work/Speech:  Reason:PT  Frequency:1 wk for 1 wk  2 wk for 6 wks...Marland KitchenMarland KitchenSocial Worker    Please forward to [employer] or providers CMA

## 2021-02-10 ENCOUNTER — Encounter: Payer: Self-pay | Admitting: Family Medicine

## 2021-02-10 ENCOUNTER — Telehealth (INDEPENDENT_AMBULATORY_CARE_PROVIDER_SITE_OTHER): Payer: Medicare Other | Admitting: Family Medicine

## 2021-02-10 ENCOUNTER — Other Ambulatory Visit: Payer: Self-pay

## 2021-02-10 VITALS — Wt 151.0 lb

## 2021-02-10 DIAGNOSIS — Q2730 Arteriovenous malformation, site unspecified: Secondary | ICD-10-CM | POA: Diagnosis not present

## 2021-02-10 DIAGNOSIS — I2699 Other pulmonary embolism without acute cor pulmonale: Secondary | ICD-10-CM | POA: Diagnosis not present

## 2021-02-10 DIAGNOSIS — D649 Anemia, unspecified: Secondary | ICD-10-CM

## 2021-02-10 DIAGNOSIS — M0600 Rheumatoid arthritis without rheumatoid factor, unspecified site: Secondary | ICD-10-CM

## 2021-02-10 DIAGNOSIS — R600 Localized edema: Secondary | ICD-10-CM

## 2021-02-10 DIAGNOSIS — I10 Essential (primary) hypertension: Secondary | ICD-10-CM

## 2021-02-10 DIAGNOSIS — K449 Diaphragmatic hernia without obstruction or gangrene: Secondary | ICD-10-CM

## 2021-02-10 MED ORDER — ONDANSETRON 4 MG PO TBDP
4.0000 mg | ORAL_TABLET | Freq: Three times a day (TID) | ORAL | 1 refills | Status: DC | PRN
Start: 2021-02-10 — End: 2023-07-15

## 2021-02-10 NOTE — Telephone Encounter (Signed)
Home Health was called and informed of Verbal orders.
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
## 2021-02-10 NOTE — Telephone Encounter (Signed)
Please ok those verbal orders  

## 2021-02-12 NOTE — Assessment & Plan Note (Signed)
Pt thinks this is worse after IVF in the hospital  Has joint swelling also from RA No CHF known  Watching renal labs

## 2021-02-12 NOTE — Assessment & Plan Note (Signed)
Recent GI bleed from this and in the hospital her blood thinner was stopped (ascending colon) Reviewed hospital records, lab results and studies in detail , EGD and colonoscopy were done  Improved anemia s/p transfusion with Hb of 8.4  No dark stool or blood in stool and appetite is fair  She will need GI follow up

## 2021-02-12 NOTE — Assessment & Plan Note (Addendum)
Pt was placed initially on eliquis 5 mg bid -then held due to GI bleed Record states she was put back on it but per pt it was not re started due to the anemia and she was not given this in rehab  Breathing is back to baseline (with poor stamina as expected) Will review record to look for documentation re: anticoagulant status  She also has copd and will continue pulmonary f/u

## 2021-02-12 NOTE — Patient Instructions (Signed)
Continue PT at home to get stronger  Follow up with me next week  I will review the record re: blood thinner  Watch out for dizziness or increased weakness , abdominal pain or blood in stool/dark stool

## 2021-02-12 NOTE — Assessment & Plan Note (Signed)
Iron def anemia - s/p GI bleed Reviewed hospital records, lab results and studies in detail  Last Hb stable at 8.4  May end up needing iron  No further signs of GI bleed

## 2021-02-12 NOTE — Assessment & Plan Note (Signed)
Pt has pain in multiple joints and spine that keep her from walking  Plans to continue rheumatology visits  PT and OT were ordered at home from the hospital  Could not take methotrexate Investigating coverage of enbrel

## 2021-02-12 NOTE — Assessment & Plan Note (Signed)
Noted with EGD  Taking protonix 40 mg daily

## 2021-02-13 ENCOUNTER — Telehealth: Payer: Self-pay | Admitting: Family Medicine

## 2021-02-13 NOTE — Telephone Encounter (Signed)
I see it now, she is in ER

## 2021-02-13 NOTE — Telephone Encounter (Signed)
Provider portion of [employer] PAP received. Placing in PAP pending info folder while we await patient portion  Chesley Mires, PharmD, MPH, BCPS Clinical Pharmacist (Rheumatology and Pulmonology)

## 2021-02-13 NOTE — Telephone Encounter (Signed)
I don't see anything in care everywhere yet unless there is an area I am unaware of.  Want to make sure she went.  Will route back to myself as a reminder

## 2021-02-13 NOTE — Telephone Encounter (Signed)
Per Dr. Milinda Antis it seems like pt should have been d/c on a blood thinner from recent hospitalization given sxs and pt isn't on a blood thinner Dr. Milinda Antis recommends she go to any hospital that the EMT's will take her.   Called [employer] back and she was still with pt. I told her Dr. Royden Purl concern's and she recommends pt go back to ER. Pt verbalized understanding she said she will go to [facility], Marchelle Folks said she will call 911 now. FYI to PCP

## 2021-02-13 NOTE — Telephone Encounter (Signed)
Marchelle Folks from [facility] has called stating the pt has high BP (172/68), high pulse (105-115), high respirations (30 per minute), low oxygen (82-87)  She was informed to go to the ED but her preferred hospital I [facility] and currently the pt is in [place] and the ambulance will not take her there.  Please advise  Marchelle [phone]

## 2021-02-14 NOTE — Telephone Encounter (Signed)
Pt called to cancel appt for tomorrow because she is in the hospital with Covid. She also wanted to check on getting a hospital bed at home

## 2021-02-15 ENCOUNTER — Telehealth: Payer: Medicare Other | Admitting: Family Medicine

## 2021-02-21 NOTE — Telephone Encounter (Signed)
She is still in the hospital/ is communicating with mychart  Will work on hospital bed order when she comes out/will need to know what home care agency she uses at that time

## 2021-02-28 ENCOUNTER — Telehealth: Payer: Self-pay | Admitting: Internal Medicine

## 2021-02-28 DIAGNOSIS — F17201 Nicotine dependence, unspecified, in remission: Secondary | ICD-10-CM

## 2021-02-28 DIAGNOSIS — M25531 Pain in right wrist: Secondary | ICD-10-CM

## 2021-02-28 DIAGNOSIS — R5382 Chronic fatigue, unspecified: Secondary | ICD-10-CM | POA: Diagnosis not present

## 2021-02-28 DIAGNOSIS — E7800 Pure hypercholesterolemia, unspecified: Secondary | ICD-10-CM

## 2021-02-28 DIAGNOSIS — J449 Chronic obstructive pulmonary disease, unspecified: Secondary | ICD-10-CM

## 2021-02-28 DIAGNOSIS — M069 Rheumatoid arthritis, unspecified: Secondary | ICD-10-CM | POA: Diagnosis not present

## 2021-02-28 DIAGNOSIS — M0600 Rheumatoid arthritis without rheumatoid factor, unspecified site: Secondary | ICD-10-CM

## 2021-02-28 DIAGNOSIS — R911 Solitary pulmonary nodule: Secondary | ICD-10-CM

## 2021-02-28 DIAGNOSIS — M1711 Unilateral primary osteoarthritis, right knee: Secondary | ICD-10-CM | POA: Diagnosis not present

## 2021-02-28 DIAGNOSIS — J42 Unspecified chronic bronchitis: Secondary | ICD-10-CM

## 2021-02-28 DIAGNOSIS — I2699 Other pulmonary embolism without acute cor pulmonale: Secondary | ICD-10-CM | POA: Diagnosis not present

## 2021-02-28 DIAGNOSIS — Q2730 Arteriovenous malformation, site unspecified: Secondary | ICD-10-CM

## 2021-02-28 NOTE — Telephone Encounter (Signed)
Called patient - states she is planning to mail Enbrel PAP application to [employer] today.  Will fax in provider portion later this week  Chesley Mires, PharmD, MPH, BCPS Clinical Pharmacist (Rheumatology and Pulmonology)

## 2021-02-28 NOTE — Telephone Encounter (Signed)
Patient left a voicemail stating she is still filling out paperwork to start a new medication. Patient stated that she is in so much pain and cannot wait until the paperwork is finished. Patient requested something for pain in the meantime.

## 2021-03-01 ENCOUNTER — Encounter: Payer: Self-pay | Admitting: Family Medicine

## 2021-03-01 ENCOUNTER — Telehealth (INDEPENDENT_AMBULATORY_CARE_PROVIDER_SITE_OTHER): Payer: Medicare Other | Admitting: Family Medicine

## 2021-03-01 ENCOUNTER — Other Ambulatory Visit: Payer: Self-pay

## 2021-03-01 DIAGNOSIS — M1712 Unilateral primary osteoarthritis, left knee: Secondary | ICD-10-CM

## 2021-03-01 DIAGNOSIS — D649 Anemia, unspecified: Secondary | ICD-10-CM

## 2021-03-01 DIAGNOSIS — U071 COVID-19: Secondary | ICD-10-CM | POA: Diagnosis not present

## 2021-03-01 DIAGNOSIS — J449 Chronic obstructive pulmonary disease, unspecified: Secondary | ICD-10-CM | POA: Diagnosis not present

## 2021-03-01 DIAGNOSIS — Q2730 Arteriovenous malformation, site unspecified: Secondary | ICD-10-CM | POA: Diagnosis not present

## 2021-03-01 DIAGNOSIS — I10 Essential (primary) hypertension: Secondary | ICD-10-CM

## 2021-03-01 DIAGNOSIS — Z86711 Personal history of pulmonary embolism: Secondary | ICD-10-CM

## 2021-03-01 DIAGNOSIS — M0600 Rheumatoid arthritis without rheumatoid factor, unspecified site: Secondary | ICD-10-CM

## 2021-03-01 MED ORDER — METOPROLOL SUCCINATE ER 25 MG PO TB24: 12.5000 mg | ORAL_TABLET | Freq: Two times a day (BID) | ORAL | 3 refills | Status: DC

## 2021-03-01 NOTE — Patient Instructions (Signed)
Start therapy with home care as planned  Please let us know if they can draw your labs   Continue the metoprolol 25 mg one half pill twice daily and watch blood pressure   Stay off the estrogen  Continue iron until further notice   I will consult our pharmacist to see if we can get you help with symbicort cost  When you are mobile I would like to get you in here for a visit  Also will need a GI referral for the past GI bleeding

## 2021-03-01 NOTE — Assessment & Plan Note (Signed)
Recent hosp for this with pna and acute resp failure Reviewed hospital records, lab results and studies in detail  Much improved and home with home care  Will continue to monitor  On 10 mg prednisone daily  She was in trial of fostamatinib vs placebo

## 2021-03-01 NOTE — Assessment & Plan Note (Signed)
From hospitalization in October  Unable to tolerate any anticoagulation (due to anemia from avm)   Hb did drop when trying this hospitalization  Now doing well  Off any anticoagulation and moving in bed to prevent DVT No DVT during recent hospitalization fortunately  Reviewed hospital records, lab results and studies in detail

## 2021-03-01 NOTE — Assessment & Plan Note (Signed)
Recent hosp for this with covid pna Reviewed hospital records, lab results and studies in detail   Much improved  On 10 mg of prednisone (for joints also)  Would like to see if we can get symbicort 160-4.5 covered for her She has rescue alb mdi and duo neb

## 2021-03-01 NOTE — Progress Notes (Signed)
Virtual Visit via Video Note  I connected with Lori Orozco on 03/01/21 at  4:00 PM EST by a video enabled telemedicine application and verified that I am speaking with the correct person using two identifiers.  Location: Patient: home Provider: office   I discussed the limitations of evaluation and management by telemedicine and the availability of in person appointments. The patient expressed understanding and agreed to proceed.  Parties involved in encounter  Patient: Lori Orozco   Provider:  Loura Pardon MD   History of Present Illness: Pt presents for f/u of hospitalization for covid acute resp failure/pna with copd   Reviewed records from epic and care everywhere   SYNOPSIS:  71 year old female hospitalized at [facility] from 12/19-12/29 for acute hypoxemic respiratory failure secondary to COVID-19 and bacterial pneumonia in the setting of baseline COPD. Patient was managed with steroids and nebs and weaned off supplemental oxygen by the time of discharge. Also she was enrolled on a research trial of fostamatinib versus placebo during the hospitalization for COVID. Patient with recent diagnosis of provoked PE with intolerance to anticoagulation due to GI bleed and acute blood loss anemia and currently off anticoagulation. Patient referred to hospitalist at home for respiratory status eval and repeat CBC.  From d/c summary ASSESSMENT/ PLAN:   Acute hypoxemic respiratory failure, resolved  Secondary to COVID-19 pneumonia and CAP, improving  History of COPD Treated with antibiotics, steroids and inhalers. Was enrolled in drug trial-fostamatinib versus placebo for COVID-19 infection Continue Symbicort twice daily and albuterol as needed   History of PE  Normocytic anemia Unable to tolerate anticoagulation given with multiple attempts and deemed not a candidate for [facility] 4 limbs ultrasound negative for any DVT on 12/27   Weakness, likely secondary to above  Chronic  back pain Declined SNF and discharged home with home PT in hospital but was ordered   Most recent labs  Ref Range & Units 4 d ago  WBC 4.4 - 11.0 x 10*3/uL 8.8   RBC 4.10 - 5.10 x 10*6/uL 3.45 Low    Hemoglobin 12.3 - 15.3 G/DL 9.7 Low    Hematocrit 35.9 - 44.6 % 30.1 Low    MCV 80.0 - 96.0 FL 87.2   MCH 27.5 - 33.2 PG 28.1   MCHC 33.0 - 37.0 G/DL 32.3 Low    RDW 12.3 - 17.0 % 18.3 High    Platelets 150 - 450 X 10*3/uL 307   MPV 6.8 - 10.2 FL 7.5    She has a px for iron/gets it otc  No blood in stool Stool is not black      Chem panel Specimen:  Blood  Ref Range & Units 6 d ago Comments  Sodium 135 - 146 MMOL/L 138    Potassium 3.5 - 5.3 MMOL/L 4.0  NO VISIBLE HEMOLYSIS  Chloride 98 - 110 MMOL/L 99    CO2 21 - 31 MMOL/L 31    BUN 8 - 24 MG/DL 19    Glucose 70 - 99 MG/DL 84    Creatinine 0.60 - 1.20 MG/DL 0.48 Low     Calcium 8.5 - 10.5 MG/DL 8.3 Low     Total Protein 6.4 - 8.9 G/DL 5.3 Low     Albumin  3.5 - 5.7 G/DL 2.3 Low     Total Bilirubin 0.0 - 1.0 MG/DL 0.3    Alkaline Phosphatase 34 - 104 IU/L or U/L 69    AST (SGOT) 13 - 39 IU/L or U/L 53 High  ALT (SGPT) 7 - 52 IU/L or U/L 49    Anion Gap 4 - 14 MMOL/L 8    Est. GFR >=60 ML/MIN/1.73 M*2 >90      Echo on 12/22 fairly nl with no pulmonary HTN Trace mitral and tricuspid regurge EF 55-60%  Feeling better and stronger now  At home and thrilled to be  Starts PT this week  Is sitting up some in bed  Exercising in the bed  Eating well and eating healthier  Drinking more water also / stopped soft drinks   Breathing is very good right now  Not raspy or wheezy  Symbicort at the hospital - cannot afford it  Has albuterol -can afford  Has neb if needed   Taking steroids from home for pain  10 mg daily   Off estrogen - few hot flashes   She sees rheumatology and is extremely swollen and painful all over  Working to get methotrexate shots - trying to get pt assist with this Will be going back to  orthopedics for painful knee as well    Metoprolol 25 mg twice daily for blood pressure  Has a cuff to check now  Patient Active Problem List   Diagnosis Date Noted   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   History of pulmonary embolism 12/08/2020   Arthritis of knee 12/06/2020   Bilateral knee pain 09/20/2020   Seronegative rheumatoid arthritis ([place]) 09/20/2020   High risk medication use 09/20/2020   Extensor tenosynovitis of right wrist 09/15/2020   Wrist pain 08/30/2020   Pain and swelling of right wrist 08/28/2020   S/P reverse total shoulder arthroplasty, 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   Lung nodule < 6cm on CT 01/02/2020   COVID-19 03/31/2019   Status post arthroscopy of left shoulder 01/16/2018   Estrogen deficiency 06/28/2017   Medial epicondylitis, left elbow 04/29/2017   S/P arthroscopy of left shoulder 02/25/2017   Impingement syndrome of left shoulder 01/30/2017   Pedal edema 10/26/2016   Trochanteric bursitis, right hip 04/10/2016   Essential hypertension 09/02/2015   Urge incontinence 07/27/2015   Obesity 04/28/2015   Need for hepatitis C screening test 04/26/2015   Screening for HIV (human immunodeficiency virus) 04/26/2015   Osteoarthritis of left knee 01/28/2015   Status post total left knee replacement 01/28/2015   Vitamin D deficiency 04/27/2014   Seasonal and perennial allergic rhinitis 06/17/2013   S/P total hip arthroplasty 03/20/2013   Medicare annual wellness visit, subsequent 11/18/2012   History of falling 11/18/2012   Routine general medical examination at a health care facility [id number]   Lichen sclerosus et atrophicus of the vulva 07/29/2012   Osteopenia 10/19/2011   Other screening mammogram 10/19/2011   Hypothyroidism 10/19/2011   ANEMIA 10/11/2009   PERIPHERAL NEUROPATHY, FEET 08/05/2009   Backache 08/05/2009   HAND PAIN, BILATERAL 08/05/2009   TREMOR 03/09/2008   MENOPAUSAL SYNDROME 10/09/2007   DEPRESSION, MAJOR 05/20/2007   BIPOLAR  AFFECTIVE DISORDER 05/20/2007   Generalized anxiety disorder 05/20/2007   PERSONALITY DISORDER 05/20/2007   ESOPHAGEAL SPASM 05/20/2007   Diaphragmatic hernia 05/20/2007   AMAUROSIS FUGAX 05/07/2007   COPD mixed type ([place]) 03/27/2007   HYPERCHOLESTEROLEMIA, PURE 12/10/2006   SYMPTOM, SYNDROME, CHRONIC FATIGUE 12/10/2006   Past Medical History:  Diagnosis Date   Amaurosis fugax    Anemia    hx   Anxiety    Arthritis    Asthma    Cataract    Chronic fatigue syndrome    Chronic kidney disease  frequency, nephritis when 71 yrs old   COPD (chronic obstructive pulmonary disease) ([place])    COVID-19    Depression    Diverticulitis    Emphysema of lung (HCC)    Fibromyalgia    GERD (gastroesophageal reflux disease)    occ   H/O hiatal hernia    History of bronchitis    Hyperlipidemia    Hypothyroidism    Interstitial cystitis    Irritable bowel syndrome    Lichen sclerosus    Lumbar herniated disc    Migraines    Pneumonia    PONV (postoperative nausea and vomiting)    Shortness of breath    exertion    Thyroid disease    [place]   Urinary frequency    Urinary tract infection    Vertigo    Past Surgical History:  Procedure Laterality Date   ABDOMINAL HYSTERECTOMY     bladder sugery      CAROTID DOPPLAR     COLONOSCOPY  05/26/2010   avms- otherwise nl , re check 10y   DEXA-OSTEOPENIA     DOPPLER ECHOCARDIOGRAPHY     elbow surgery     EPICONDYLITIS     EYE SURGERY Bilateral    cataracts   FOOT SURGERY Bilateral    I & D EXTREMITY Right 09/01/2020   Procedure: TYNOSYNOVECTOMY;  Surgeon: Mcarthur Rossetti, MD;  Location: [place];  Service: Orthopedics;  Laterality: Right;   KNEE SURGERY     Left cartilage   lipoma in second finger right hand     PLANTAR FASCIA SURGERY Left    REVERSE SHOULDER ARTHROPLASTY Left 02/02/2020   Procedure: LEFT REVERSE SHOULDER ARTHROPLASTY;  Surgeon: Meredith Pel, MD;  Location: [place];  Service: Orthopedics;  Laterality:  Left;   ROTATOR CUFF REPAIR Left 12/2017   TEMPOROMANDIBULAR JOINT SURGERY     TONSILLECTOMY     TOTAL HIP ARTHROPLASTY Right 03/20/2013   Procedure: Right TOTAL HIP ARTHROPLASTY;  Surgeon: Newt Minion, MD;  Location: [place];  Service: Orthopedics;  Laterality: Right;  Right Total Hip Arthroplasty   TOTAL KNEE ARTHROPLASTY Left 01/28/2015   Procedure: LEFT TOTAL KNEE ARTHROPLASTY;  Surgeon: Mcarthur Rossetti, MD;  Location: [facility];  Service: Orthopedics;  Laterality: Left;   TRIGGER FINGER RELEASE Right    TROCHANTERIC BURSA EXCISION Right 05/03/2016   Dr. Ninfa Linden, [place] Gave   WRIST ARTHROSCOPY Right    ligament tear   Social History   Tobacco Use   Smoking status: Former    Packs/day: 2.00    Years: 40.00    Pack years: 80.00    Types: E-cigarettes, Cigarettes    Quit date: 2015    Years since quitting: 8.0   Smokeless tobacco: Never  Vaping Use   Vaping Use: Former   Quit date: 11/26/2020   Devices: no nicotine in it  Substance Use Topics   Alcohol use: No    Alcohol/week: 0.0 standard drinks   Drug use: No   Family History  Problem Relation Age of Onset   Arthritis Mother    Hypertension Mother    Kidney disease Mother    Cancer Father        Bladder   Arthritis Sister    Colon cancer Other    Cancer - Lung Cousin    Arthritis Maternal Grandmother    Allergies  Allergen Reactions   Lithium Anaphylaxis   Tegretol [Carbamazepine] Other (See Comments)    Fever and body aches (fever over 103)  Tricyclic Antidepressants Anaphylaxis    Other reaction(s): Unknown   Methotrexate Derivatives Other (See Comments)    Urinary retention and dizziness  Other reaction(s): Other UTI   Strawberry Extract Hives   Codeine Nausea Only    Makes pt stay awake   Cymbalta [Duloxetine Hcl] Other (See Comments)    Makes pt pass out    Erythromycin     abdominal pain   Lyrica [Pregabalin]     Felt faint   Neurontin [Gabapentin]     Passes  out   Rabeprazole Sodium      insomnia   Duraprep [Antiseptic Products, Misc.] Itching and Rash    Tolerates Betadine    Penicillins Rash    Tolerated ANCEF 02/02/20  Has patient had a PCN reaction causing immediate rash, facial/tongue/throat swelling, SOB or lightheadedness with hypotension: Yes Has patient had a PCN reaction causing severe rash involving mucus membranes or skin necrosis: No Has patient had a PCN reaction that required hospitalization No Has patient had a PCN reaction occurring within the last 10 years: No If all of the above answers are "NO", then may proceed with Cephalosporin use.    Tape Rash    PT ALLERGIC NYLON TAPE    Current Outpatient Medications on File Prior to Visit  Medication Sig Dispense Refill   albuterol (VENTOLIN HFA) 108 (90 Base) MCG/ACT inhaler Inhale 2 puffs into the lungs every 6 (six) hours as needed for wheezing or shortness of breath.     budesonide-formoterol (SYMBICORT) 160-4.5 MCG/ACT inhaler Inhale 2 puffs into the lungs 2 (two) times daily.     buPROPion (WELLBUTRIN XL) 150 MG 24 hr tablet Take 150 mg by mouth daily.     busPIRone (BUSPAR) 15 MG tablet Take 15 mg by mouth 2 (two) times daily before a meal.     cholecalciferol (VITAMIN D) 25 MCG (1000 UNIT) tablet Take 2,000 Units by mouth daily. 2 tablets daily     clobetasol cream (TEMOVATE) 0.05 % APPLY SMALL AMOUNT TO AFFECTED AREAS TWO TO THREE TIMES PER WEEK 30 g 4   cyanocobalamin 2000 MCG tablet Take 1,000 mcg by mouth daily.     diclofenac Sodium (VOLTAREN) 1 % GEL Apply 2 g topically 2 (two) times daily as needed (pain).     diphenhydrAMINE (BENADRYL) 25 MG tablet Take 25 mg by mouth every 6 (six) hours as needed for allergies.     divalproex (DEPAKOTE ER) 500 MG 24 hr tablet Take 500 mg by mouth at bedtime.      fluticasone (FLONASE) 50 MCG/ACT nasal spray PLACE 2 SPRAYS INTO THE NOSE DAILY. (Patient taking differently: Place 2 sprays into both nostrils. PLACE 2 SPRAYS INTO THE NOSE DAILY.) 16 g 11    ipratropium-albuterol (DUONEB) 0.5-2.5 (3) MG/3ML SOLN Take 3 mLs by nebulization every 6 (six) hours as needed. 120 mL prn   iron polysaccharides (NIFEREX) 150 MG capsule Take 150 mg by mouth daily.     ketotifen (ZADITOR) 0.025 % ophthalmic solution Place 1 drop into both eyes 2 (two) times daily as needed (allergies).     levothyroxine (SYNTHROID) 50 MCG tablet TAKE ONE TABLET BY MOUTH DAILY BEFORE BREAKFAST (Patient taking differently: Take 50 mcg by mouth at bedtime.) 90 tablet 3   LORazepam (ATIVAN) 1 MG tablet Take 1 mg by mouth at bedtime as needed for anxiety or sleep.     mirabegron ER (MYRBETRIQ) 25 MG TB24 tablet Take 1 tablet (25 mg total) by mouth daily. 30 tablet 11  mirtazapine (REMERON) 30 MG tablet Take 30 mg by mouth at bedtime.      ondansetron (ZOFRAN-ODT) 4 MG disintegrating tablet Take 1 tablet (4 mg total) by mouth every 8 (eight) hours as needed for nausea or vomiting. 20 tablet 1   oxyCODONE (OXY IR/ROXICODONE) 5 MG immediate release tablet Take 1 tablet (5 mg total) by mouth every 6 (six) hours as needed for severe pain. 30 tablet 0   predniSONE (DELTASONE) 10 MG tablet Take 10 mg by mouth daily with breakfast.     sertraline (ZOLOFT) 100 MG tablet Take 100 mg by mouth daily.      simvastatin (ZOCOR) 20 MG tablet TAKE ONE TABLET BY MOUTH EVERY NIGHT AT BEDTIME 90 tablet 3   No current facility-administered medications on file prior to visit.   Review of Systems  Constitutional:  Positive for malaise/fatigue. Negative for chills and fever.  HENT:  Negative for congestion, ear pain, sinus pain and sore throat.   Eyes:  Negative for blurred vision, discharge and redness.  Respiratory:  Positive for cough and sputum production. Negative for shortness of breath and stridor.   Cardiovascular:  Negative for chest pain, palpitations and leg swelling.  Gastrointestinal:  Negative for abdominal pain, diarrhea, nausea and vomiting.  Musculoskeletal:  Positive for back pain and  joint pain. Negative for myalgias.  Skin:  Negative for rash.  Neurological:  Negative for dizziness and headaches.  Psychiatric/Behavioral:         Mood is stable    Observations/Objective: Patient appears well, in no distress (sitting up on hospital bed) Weight is baseline  No facial swelling or asymmetry Normal voice-not hoarse and no slurred speech No obvious tremor or mobility impairment Moving neck and UEs normally Able to hear the call well  No cough or shortness of breath during interview  Talkative and mentally sharp with no cognitive changes No skin changes on face or neck , no rash or pallor Affect is normal (good mood today)    Assessment and Plan: Problem List Items Addressed This Visit       Cardiovascular and Mediastinum   Essential hypertension    Pt was started on metoprolol 12.5 mg bid Doing well and well tolerated Encouraged her to check bp at home when able and also watch pulse       Relevant Medications   metoprolol succinate (TOPROL-XL) 25 MG 24 hr tablet   Arteriovenous malformation (AVM)    Her Hb did drop in hospital with anticoagulation  This was stopped  Hb stable at 9.7 at her d/c Will need f/u labs- not mobile right now and will see if home care can draw labs       Relevant Medications   metoprolol succinate (TOPROL-XL) 25 MG 24 hr tablet     Respiratory   COPD mixed type ([place])    Recent hosp for this with covid pna Reviewed hospital records, lab results and studies in detail   Much improved  On 10 mg of prednisone (for joints also)  Would like to see if we can get symbicort 160-4.5 covered for her She has rescue alb mdi and duo neb      Relevant Medications   predniSONE (DELTASONE) 10 MG tablet   budesonide-formoterol (SYMBICORT) 160-4.5 MCG/ACT inhaler   Other Relevant Orders   AMB Referral to [place]     Musculoskeletal and Integument   Osteoarthritis of left knee    Pt planning f/u with rheum and ortho  when  she is mobile      Relevant Medications   predniSONE (DELTASONE) 10 MG tablet   Seronegative rheumatoid arthritis (H[facility])    [facility]  Still now walking after several hospitalizations (hosp bed with home care) Per pt working to get MTX injection covered (she did not tolerate oral) Currently on prednisone 10 mg and pain medication  Will continue to follow       Relevant Medications   predniSONE (DELTASONE) 10 MG tablet     Other   ANEMIA    Hb dropped in hospital when trying anticoag again (AVM)  Had transfusion and now back up to 9.7/stable  Taking nu iron 150  Will need to plan labs when mobile or had home care draw  Off anti coag No abd pain or blood in stool      Relevant Medications   iron polysaccharides (NIFEREX) 150 MG capsule   COVID-19 - Primary    Recent hosp for this with pna and acute resp failure Reviewed hospital records, lab results and studies in detail  Much improved and home with home care  Will continue to monitor  On 10 mg prednisone daily  She was in trial of fostamatinib vs placebo       History of pulmonary embolism    From hospitalization in October  Unable to tolerate any anticoagulation (due to anemia from avm)   Hb did drop when trying this hospitalization  Now doing well  Off any anticoagulation and moving in bed to prevent DVT No DVT during recent hospitalization fortunately  Reviewed hospital records, lab results and studies in detail          Follow Up Instructions:    I discussed the assessment and treatment plan with the patient. The patient was provided an opportunity to ask questions and all were answered. The patient agreed with the plan and demonstrated an understanding of the instructions.   The patient was advised to call back or seek an in-person evaluation if the symptoms worsen or if the condition fails to improve as anticipated.     Loura Pardon, MD

## 2021-03-01 NOTE — Assessment & Plan Note (Signed)
Hb dropped in hospital when trying anticoag again (AVM)  Had transfusion and now back up to 9.7/stable  Taking nu iron 150  Will need to plan labs when mobile or had home care draw  Off anti coag No abd pain or blood in stool

## 2021-03-01 NOTE — Assessment & Plan Note (Signed)
Pt planning f/u with rheum and ortho when she is mobile

## 2021-03-01 NOTE — Assessment & Plan Note (Signed)
Sees Dr Dimple [facility]  Still now walking after several hospitalizations (hosp bed with home care) Per pt working to get MTX injection covered (she did not tolerate oral) Currently on prednisone 10 mg and pain medication  Will continue to follow

## 2021-03-01 NOTE — Assessment & Plan Note (Signed)
Pt was started on metoprolol 12.5 mg bid Doing well and well tolerated Encouraged her to check bp at home when able and also watch pulse

## 2021-03-01 NOTE — Assessment & Plan Note (Signed)
Her Hb did drop in hospital with anticoagulation  This was stopped  Hb stable at 9.7 at her d/c Will need f/u labs- not mobile right now and will see if home care can draw labs

## 2021-03-02 ENCOUNTER — Encounter: Payer: Self-pay | Admitting: Family Medicine

## 2021-03-06 NOTE — Telephone Encounter (Signed)
Submitted a Prior Authorization request to [facility] for ENBREL via CoverMyMeds. Will update once we receive a response.  Key: [id number]  Per CMM, PA is not required. Patient still has access to drug. Printed CMM result page to submit with patient assistance application  Submitted Patient Assistance Application to [employer] for [employer] along with provider portion, PA, insurance card copy, and med list. Will update patient when we receive a response.  Fax# [phone] Phone# [phone]  Knox Saliva, PharmD, MPH, BCPS Clinical Pharmacist (Rheumatology and Pulmonology)

## 2021-03-07 ENCOUNTER — Telehealth: Payer: Self-pay | Admitting: Family Medicine

## 2021-03-07 NOTE — Telephone Encounter (Signed)
Spoke to patient by telephone and was advised that she did the test wrong and is getting ready to do it again now.

## 2021-03-07 NOTE — Telephone Encounter (Signed)
Found the number to call for lab draw from [place], . Phone [phone]. Order to be faxed to [phone]. Spoke with Tim at [facility] and he said he is only one working in the office that can do this right now. At the latest lab draw will be done this weekend but will try and go out to the patient home earlier if possible. Order faxed over

## 2021-03-07 NOTE — Telephone Encounter (Signed)
Patient notified as instructed by telephone and verbalized understanding. Patient stated that she is feeling okay now. Patient stated that she will call back and schedule a virtual visit if she does not continue to improve. Patient was given ER precautions and she verbalized understanding.

## 2021-03-07 NOTE — Telephone Encounter (Signed)
Spoke to patient by telephone and was advised that she did a video visit with Dr. Milinda Antisower last week and got over all of those symptoms and started feeling good again. Patient stated that she started with chills a couple of days ago and had chills really bad last night. Patient stated that she has had a fever also. Patient denies any other symptoms. Patient stated that she has not done a coivd test, but will do one now. Patient was advised that I will call her back shortly for the results.

## 2021-03-07 NOTE — Telephone Encounter (Signed)
Trey Paula called in from [facility] he is the OT nurse and when he came out to do her OT she told him that she chills and a fever of 100.2 and other than those symptoms she is fine

## 2021-03-07 NOTE — Telephone Encounter (Signed)
She needs a virtual visit if symptoms persist.  Unlikely covid however since she just had it

## 2021-03-07 NOTE — Telephone Encounter (Signed)
Spoke to patient by telephone and was advised that her covid test is negative. Patient stated that she has been taking tylenol for her fever which seems to help. Patient denies a cough, headache, SOB or difficulty breathing. Patient was given ER precautions and she verbalized understanding.

## 2021-03-09 MED ORDER — PREDNISONE 20 MG PO TABS: 20.0000 mg | ORAL_TABLET | Freq: Every day | ORAL | 0 refills | Status: DC

## 2021-03-09 NOTE — Telephone Encounter (Signed)
I spoke with Lori Orozco pain is severe with a lot of swelling ongoing involving right hand and wrist, bilateral knees, and her left ankle despite 10 mg prednisone daily. She felt symptoms were much better last month on higher dose taper although not completely so. She is still waiting to hear back more about the Enbrel assistance approval. Because she has not started treatment yet and will probably take a few weeks to improve even after starting it will go back up to 20 mg daily prednisone for now until getting started or for the next month.

## 2021-03-09 NOTE — Addendum Note (Signed)
Addended by: Fuller Plan on: 03/09/2021 02:11 PM   Modules accepted: Orders

## 2021-03-15 ENCOUNTER — Telehealth: Payer: Self-pay

## 2021-03-15 NOTE — Telephone Encounter (Signed)
[place] Night - Client Nonclinical Telephone Record  [facility] Client [place] Night - Client Client Site [place] Provider Loura Pardon - MD Contact Type Call Who Is Calling Patient / Member / Family / Caregiver Caller Name Shelton Silvas from [place] Phone Number [phone] Patient Name Lori Orozco Patient DOB 1950/04/23 Call Type Message Only Information Provided Reason for Call Request for General Office Information Initial Comment Caller needs order for a patient. She needs a Social work [place]. Disp. Time Disposition Final User 03/14/2021 5:04:27 PM General Information Provided Yes Ronnald Ramp, [place] Call Closed By: Eliane Decree Transaction Date/Time: 03/14/2021 4:59:57 PM (ET

## 2021-03-15 NOTE — Telephone Encounter (Signed)
Please ok that verbal order and if you need something in epic let me know

## 2021-03-15 NOTE — Telephone Encounter (Signed)
Left VM giving VO and let her know she can fax any orders PCP needs to sign

## 2021-03-17 ENCOUNTER — Telehealth: Payer: Self-pay

## 2021-03-17 NOTE — Telephone Encounter (Signed)
Patient called checking the status of her Enbrel medication.

## 2021-03-20 ENCOUNTER — Other Ambulatory Visit (HOSPITAL_COMMUNITY): Payer: Self-pay

## 2021-03-20 ENCOUNTER — Telehealth: Payer: Self-pay

## 2021-03-20 NOTE — Telephone Encounter (Signed)
Patient left a voicemail for [facility] to return her call.

## 2021-03-20 NOTE — Telephone Encounter (Addendum)
Called [employer] for update on patient's Enbrel application. Patient has to apply for LIS.  Patient has 45 days to submit LIS decision letter.  Called patient to notify. She states that she has partial support (25%) - does not have approval letter since she has been receiving since 1980s. She states that the income she wrote down is just for her, but household size she wrote down on application was 2. She states that they do not share income. I advised her to call [employer] to clarify all of this and that they will likely require proof of income now that her household size is different - she states that she does have social security benefits letter for 2023 and can submit if needed  She will call back with update  Phone: [phone]  Chesley Mires, PharmD, MPH, BCPS Clinical Pharmacist (Rheumatology and Pulmonology)

## 2021-03-21 ENCOUNTER — Other Ambulatory Visit (HOSPITAL_COMMUNITY): Payer: Self-pay

## 2021-03-21 NOTE — Telephone Encounter (Signed)
Returned call to patient. She states she called [employer] and corrected information over the phone. They requested she provide them with letter from insurance stating her copay responsibility. Patient called insurance was told her copay was >$400 per month. Insurance refused to fax letter to [employer] directly d/t conflict of interest. Patient requested letter be mailed to her home. Once she receives, she will mail to [employer] ASAP.   I requested she notify our clinic when she sends this letter so that pharmacy team can f/u with company in 3-4 business days. She verbalized understanding.  Chesley Miresevki Tyja Gortney, PharmD, MPH, BCPS Clinical Pharmacist (Rheumatology and Pulmonology)

## 2021-03-21 NOTE — Telephone Encounter (Signed)
Returned call to patient. Will document in Enbrel BIV encounter  Knox Saliva, PharmD, MPH, BCPS Clinical Pharmacist (Rheumatology and Pulmonology)

## 2021-03-21 NOTE — Telephone Encounter (Signed)
Returned call to patient in Enbrel BIV telephone encounter.  Chesley Mires, PharmD, MPH, BCPS Clinical Pharmacist (Rheumatology and Pulmonology)

## 2021-03-31 ENCOUNTER — Telehealth: Payer: Self-pay | Admitting: Family Medicine

## 2021-03-31 DIAGNOSIS — M0600 Rheumatoid arthritis without rheumatoid factor, unspecified site: Secondary | ICD-10-CM

## 2021-03-31 DIAGNOSIS — M1710 Unilateral primary osteoarthritis, unspecified knee: Secondary | ICD-10-CM

## 2021-03-31 DIAGNOSIS — M7061 Trochanteric bursitis, right hip: Secondary | ICD-10-CM

## 2021-03-31 NOTE — Telephone Encounter (Signed)
Done and in the IN box  

## 2021-03-31 NOTE — Telephone Encounter (Signed)
Pt roommate called asking a order for a bedside commode. Pt roommate states that you can fax the order to [phone]. Please advise.

## 2021-03-31 NOTE — Telephone Encounter (Signed)
Order faxed.

## 2021-04-04 ENCOUNTER — Other Ambulatory Visit: Payer: Self-pay | Admitting: Internal Medicine

## 2021-04-04 DIAGNOSIS — M0600 Rheumatoid arthritis without rheumatoid factor, unspecified site: Secondary | ICD-10-CM

## 2021-04-05 ENCOUNTER — Telehealth: Payer: Self-pay

## 2021-04-05 NOTE — Telephone Encounter (Signed)
Next Visit: not scheduled  Last Visit: 02/08/2021  Last Fill: 03/09/2021  Dx: Seronegative rheumatoid arthritis   Current Dose per office note on 02/08/2021: not discussed  Okay to refill Prednisone?  When should patient schedule a follow up?

## 2021-04-05 NOTE — Telephone Encounter (Signed)
Received Access Nurse on the patient for verbal orders. Per fazed it looks like provider on call was paged and this was done. I left a message for Harriett Sineancy asking her to call us back to let us know if this was handled for the patient. CB [phone]  Client [facility] - Client [facility] - Night Provider Roxy Mannsower, [place] - MD Contact Type Call Call Type Home Care Hospice Page Now Who Is Calling Home Health / Hospice Agency Caller Name Lenetta Quakerancy Hickmon Facility Name [facility] Facility Number [phone] Patient Name Lori Orozco Patient DOB 1950-11-29 Reason for Call Request to speak to Physician Initial Comment Caller states she would like to get a verbal order for a mutual pt. Disp. Time Disposition Final User 04/05/2021 10:40:08 AM [facility] Paging Queue Renne MuscaGeter, Chloe-Jade 04/05/2021 11:44:52 AM Paged On Call to Other Provider Gasper Sellsartin, David 04/05/2021 11:49:21 AM Page Completed Yes Bernette MayersPartin, David Paging DoctorName Phone DateTime Result/Outcome Message Type Notes Roxy Mannsower, [place] [phone] 04/05/2021 11:44:52 AM Paged On Call to Other Provider Doctor Paged Message from Access Nurse: Lenetta QuakerNancy [facility]mon with [facility] at [phone] requests call regarding orders for mutual patient. Roxy Mannsower, Marne - MD 04/05/2021 11:48:50 AM Paged On Call to Another Provider Message Result Call Closed By: Gasper Sellsavid Partin Transaction Date/Time: 04/05/2021 10:36:05 AM (ET

## 2021-04-06 NOTE — Telephone Encounter (Signed)
Okay to refill. Plan was after Enbrel started we can reschedule whenever she is able to start this and let us know. Otherwise in 1 month to review steroids plan if not started on Enbrel by then.

## 2021-04-07 ENCOUNTER — Other Ambulatory Visit: Payer: Self-pay | Admitting: Orthopaedic Surgery

## 2021-04-07 ENCOUNTER — Telehealth: Payer: Self-pay | Admitting: Orthopaedic Surgery

## 2021-04-07 MED ORDER — HYDROCODONE-ACETAMINOPHEN 5-325 MG PO TABS
1.0000 | ORAL_TABLET | Freq: Three times a day (TID) | ORAL | 0 refills | Status: DC | PRN
Start: 2021-04-07 — End: 2021-05-22

## 2021-04-07 NOTE — Telephone Encounter (Signed)
Patient advised we sent refill into the pharmacy. Patient advised plan was after Enbrel started we can reschedule whenever she is able to start this and let us know. Otherwise in 1 month to review steroids plan if not started on Enbrel by then.

## 2021-04-07 NOTE — Telephone Encounter (Signed)
Please advise 

## 2021-04-07 NOTE — Telephone Encounter (Signed)
Pt would like refill on hydrocodone.  

## 2021-04-17 ENCOUNTER — Telehealth: Payer: Self-pay | Admitting: Family Medicine

## 2021-04-17 NOTE — Chronic Care Management (AMB) (Signed)
°  Chronic Care Management   Note  04/17/2021 Name: Lori Orozco MRN: [id number] DOB: 12-18-1950  Lori Orozco is a 71 y.o. year old female who is a primary care patient of Tower, Audrie Gallus, MD. I reached out to [name] by phone today in response to a referral sent by Ms. Driscilla Moats Sadiq's PCP, Tower, Audrie Gallus, MD.   Ms. Pagliuca was given information about Chronic Care Management services today including:  CCM service includes personalized support from designated clinical staff supervised by her physician, including individualized plan of care and coordination with other care providers 24/7 contact phone numbers for assistance for urgent and routine care needs. Service will only be billed when office clinical staff spend 20 minutes or more in a month to coordinate care. Only one practitioner may furnish and bill the service in a calendar month. The patient may stop CCM services at any time (effective at the end of the month) by phone call to the office staff.   Patient agreed to services and verbal consent obtained.   Follow up plan:PT AWARE DEDUCTIBLE   Tatjana Dellinger Upstream Scheduler

## 2021-04-19 ENCOUNTER — Other Ambulatory Visit (HOSPITAL_COMMUNITY): Payer: Self-pay

## 2021-04-19 DIAGNOSIS — E7800 Pure hypercholesterolemia, unspecified: Secondary | ICD-10-CM

## 2021-04-19 DIAGNOSIS — R911 Solitary pulmonary nodule: Secondary | ICD-10-CM

## 2021-04-19 DIAGNOSIS — F419 Anxiety disorder, unspecified: Secondary | ICD-10-CM

## 2021-04-19 DIAGNOSIS — J449 Chronic obstructive pulmonary disease, unspecified: Secondary | ICD-10-CM

## 2021-04-19 DIAGNOSIS — M1711 Unilateral primary osteoarthritis, right knee: Secondary | ICD-10-CM | POA: Diagnosis not present

## 2021-04-19 DIAGNOSIS — M069 Rheumatoid arthritis, unspecified: Secondary | ICD-10-CM | POA: Diagnosis not present

## 2021-04-19 DIAGNOSIS — F17201 Nicotine dependence, unspecified, in remission: Secondary | ICD-10-CM

## 2021-04-19 DIAGNOSIS — F32A Depression, unspecified: Secondary | ICD-10-CM

## 2021-04-19 DIAGNOSIS — Q2730 Arteriovenous malformation, site unspecified: Secondary | ICD-10-CM

## 2021-04-19 DIAGNOSIS — J42 Unspecified chronic bronchitis: Secondary | ICD-10-CM

## 2021-04-19 DIAGNOSIS — I2699 Other pulmonary embolism without acute cor pulmonale: Secondary | ICD-10-CM | POA: Diagnosis not present

## 2021-04-19 DIAGNOSIS — R5382 Chronic fatigue, unspecified: Secondary | ICD-10-CM | POA: Diagnosis not present

## 2021-04-19 DIAGNOSIS — I2510 Atherosclerotic heart disease of native coronary artery without angina pectoris: Secondary | ICD-10-CM

## 2021-04-19 NOTE — Telephone Encounter (Addendum)
Patient returned call. She states the letter is not from [facility] [employer] [facility]. She states that letter is from [facility] but does not state her patient cost responsibility for Enbrel. She states that she has not called [employer] regarding this. I urged her to call [employer] to review this.  Called [employer] for update on application.  Per rep, they need screenshot of patient's copay sent in since they need proof that she cannot afford medication despite coverage. Screenshot of test claim sent to [employer]  Phone: [phone]  Chesley Mires, PharmD, MPH, BCPS Clinical Pharmacist (Rheumatology and Pulmonology)

## 2021-04-19 NOTE — Telephone Encounter (Signed)
Patient left VM stating that she received LIS letter. Returned call to patient but unable to reach. Left VM advising that if it is denial letter, she will need to send it in to [employer]. If it is approval letter, then she will not use program. Will f/u  Chesley Mires, PharmD, MPH, BCPS Clinical Pharmacist (Rheumatology and Pulmonology)

## 2021-04-21 ENCOUNTER — Other Ambulatory Visit (HOSPITAL_COMMUNITY): Payer: Self-pay

## 2021-04-21 NOTE — Telephone Encounter (Signed)
Called [employer] for update on patient's Enbrel application. Per rep, patient marked that she has full LIS support - I advised that patient has 25% support and patient does not have Medicaid. ATC patient to confirm but unable to reach. Will f/u next week  Phone: [phone]  Chesley Mires, PharmD, MPH, BCPS Clinical Pharmacist (Rheumatology and Pulmonology)

## 2021-04-24 ENCOUNTER — Other Ambulatory Visit (HOSPITAL_COMMUNITY): Payer: Self-pay

## 2021-04-24 ENCOUNTER — Telehealth: Payer: Self-pay | Admitting: Pharmacist

## 2021-04-24 DIAGNOSIS — M0600 Rheumatoid arthritis without rheumatoid factor, unspecified site: Secondary | ICD-10-CM

## 2021-04-24 MED ORDER — PREDNISONE 20 MG PO TABS: 20.0000 mg | ORAL_TABLET | Freq: Every day | ORAL | 0 refills | Status: DC

## 2021-04-24 NOTE — Telephone Encounter (Signed)
Patient's Enbrel PAP enrollment remains in process. Patient sttes she is out of prednisone at this time. Requesting refill until we hear back from patient assistance program. Current dose is 20mg  once daily  Routing to Dr. Tristan Schroeder, PharmD, MPH, BCPS Clinical Pharmacist (Rheumatology and Pulmonology)

## 2021-04-24 NOTE — Telephone Encounter (Signed)
Refill for 20 mg prednisone sent. Hopefully she can get approved soon-plan next step is to taper this once she gets onto a longer term medication.

## 2021-04-24 NOTE — Telephone Encounter (Signed)
Patient called stating that she has located an LIS letter stating she receives 25% support. Letter is from 2005 or 2006. Her roommate plans to fax this today (confirmed fax number with patient today)  Received fax from [place] stating updated Enbrel prior authorization approval is needed for this calendar year. Submitted via CMM   Key: [id number]  Per automated response, the patient currently has access to the requested medication and a Prior Authorization is not needed for the patient/medication. Faxed screenshot of test claim showing note in claim that states prior auth active through 02/25/22  Pharmacy team will follow-up with [employer] on Thursday for update  Knox Saliva, PharmD, MPH, BCPS Clinical Pharmacist (Rheumatology and Pulmonology)

## 2021-05-02 ENCOUNTER — Telehealth: Payer: Self-pay

## 2021-05-02 NOTE — Telephone Encounter (Signed)
[facility] with an update on her Enbrel medication.   ?

## 2021-05-03 NOTE — Telephone Encounter (Signed)
Called [employer] for update on patient's Enbrel PAP application. Per rep, patient's application may need to be re-completed to updated forms for 2023.  ? ?Patient's application is now pending and ready for processing. Per rep, may take 24-48 hours. ? ?Phone: [phone] ? ?Chesley Mires, PharmD, MPH, BCPS ?Clinical Pharmacist (Rheumatology and Pulmonology) ?

## 2021-05-03 NOTE — Telephone Encounter (Signed)
Returned call to patient and advised that [employer] Enbrel application may take another 24-48 hours to process and we will f/u if we don't hear anything. She verbalized understanding ? ?Knox Saliva, PharmD, MPH, BCPS ?Clinical Pharmacist (Rheumatology and Pulmonology) ?

## 2021-05-05 ENCOUNTER — Telehealth: Payer: Self-pay

## 2021-05-05 ENCOUNTER — Other Ambulatory Visit (HOSPITAL_COMMUNITY): Payer: Self-pay

## 2021-05-05 NOTE — Telephone Encounter (Signed)
Patient called stating she received a call from Enbrel and was told they are still working on her application and need the following information before it can be completed.   ? ?Updated prior authorization letter for 2023. They have one for 2022, but need updated one. ? ?Another prescription written for more refills.  The one that was sent only had one refill.   ? ? ? ?

## 2021-05-05 NOTE — Telephone Encounter (Signed)
Called [employer] for update on patient's Enbrel application. Per rep, application was not placed in processing queue. ? ?Per rep, he is pushing through again since test claim shows active prior authorization ? ?Chesley Mires, PharmD, MPH, BCPS ?Clinical Pharmacist (Rheumatology and Pulmonology) ?

## 2021-05-05 NOTE — Telephone Encounter (Signed)
I've spoken with [employer] and they have pushed application to processing officially finally. Per rep, the previous rep did not send to processing ? ?Lori Orozco, PharmD, MPH, BCPS ?Clinical Pharmacist (Rheumatology and Pulmonology) ?

## 2021-05-09 ENCOUNTER — Other Ambulatory Visit (HOSPITAL_COMMUNITY): Payer: Self-pay

## 2021-05-09 NOTE — Telephone Encounter (Signed)
Received letter from [place] stating Enbrel is auto-extended. Does not indicate dates of approval and states only name of medication. Per insurance rep, they do not have a letter they can send with dates because of the type of plan patient has. Rep states that if a third-party program needs verbal confirmation, they can call and speak with customer service. ?[employer] and conference called with insurance to receive verbal confirmation of auto-extended authorization through 02/25/22. [employer] rep states this should be all that is needed at this point and she will push through processing. At this point, this is the third time I've received notice that his application is being pushed through to processing. Will f/u tomorrow ? ?Knox Saliva, PharmD, MPH, BCPS ?Clinical Pharmacist (Rheumatology and Pulmonology) ?

## 2021-05-09 NOTE — Telephone Encounter (Signed)
[place] for update on patient's Enbrel application. Per rep, they REQUIRE prior authorization letter with current date. I provided rep with PA approval number. I was unable to stay on the line due to patients waiting to be seen. Rep states she will work on case. ? ?I called insurance for prior authorization approval for 2023 since it seems there is no other way to get her case expedited. Per insurance rep, they are unable to send a letter since Enbrel PA was auto-extended but can send letter stating authorization is already on file. Will await letter and send to [employer] once received ? ?Case # [id number] ? ?Knox Saliva, PharmD, MPH, BCPS ?Clinical Pharmacist (Rheumatology and Pulmonology) ?

## 2021-05-10 ENCOUNTER — Telehealth: Payer: Self-pay | Admitting: Family Medicine

## 2021-05-10 ENCOUNTER — Telehealth: Payer: Self-pay

## 2021-05-10 NOTE — Telephone Encounter (Signed)
Please ok those verbal orders  

## 2021-05-10 NOTE — Chronic Care Management (AMB) (Signed)
? ? ?Chronic Care Management ?Pharmacy Assistant  ? ?Name: Lori Orozco  MRN: [id number] DOB: 09-06-1950 ? ?Lori Orozco is an 71 y.o. year old female who presents for his initial CCM visit with the clinical pharmacist. ? ?Reason for Encounter: Initial Questions ?  ?Conditions to be addressed/monitored: ?HTN, HLD, and COPD ? ? ?Recent office visits:  ?[facility]Beginning PT,Symbicort at the hospital - cannot afford it Has albuterol -can afford  Has neb if needed.Taking steroids from home for pain  ?10 mg daily  ?02/10/21-PCP-Marne Tower,MD-Telemedicine- Hospital follow up -Continue PT in home No medication changes ?Recent consult visits:  ?02/25/21-Internal Medicine-Padageshwar Sunkara,MD- Telemedicine hospital follow up- no medication changes ?02/08/21-Rheumatology-Christopher Rice,MD-Patient presented for follow up rheumatoid arthritis.Recommending Enbrel start for highly active seronegative arthritis(labs reviewed)No improvement to hydroxychloroquine and side effects with methotrexate. ?02/02/21-Orthopedic Surgery-Gil Clark,PA-Patient presented for right knee pain.Injection with prednisone and lidocaine. ?11/24/20-[facility]-David Wiggins,MD-Patient presented for right leg pain, right knee pain.Xrays,start short course of oxycodone 5/325 take 1 tablet every 6 hours as needed for pain and prednisone   take 2 tablets daily for 5 days. ?11/21/20-[facility]-Jennifer Pyle,PA-Patient presented for back pain.Start cyclobenzaprine  take 1 tablet 3 times daily ,start short course of hydrocodone 5/325 take 1 tablet every 6 hours as needed for pain , ibuprofen  take 3 tablets every 6 hours as needed.  ?11/16/20-OB-GYN-Ugonna Anyanwu,MD-Patient presented for follow up lichen sclerosus and menopausal symptoms.- clobetasol cream (TEMOVATE) 0.05 %; APPLY SMALL AMOUNT TO AFFECTED AREAS TWO TO THREE TIMES PER WEEK-(PREMARIN) 0.625 MG tablet; Take 1 tablet  (0.625 mg total) by mouth daily. Take daily for 21 days then do not take for 7 days.   ?11/15/20-Rheumatolog-Christopher Rice,MD- Patient presented for follow up.Will discontinue methotrexate and start hydroxychloroquine 400 mg PO daily.also recommend restarting prednisone at 40 mg PO daily for 10 days if helpful can taper otherwise discontinue. ? ? ?Hospital visits:  ?Medication Reconciliation was completed by comparing discharge summary, patient?s EMR and Pharmacy list, and upon discussion with patient. ? ?Admitted to the hospital on 02/13/21 due to Hypoxic respiratory failure. Discharge date was 02/23/21. Discharged from [facility].   ? ?Medication Changes at Hospital Discharge: ?-Changed to Symbicort inhaler twice a day , iron supplement daily , Metoprolol twice a day, pantoprazole once daily  ? ?Medications Discontinued at Hospital Discharge: ?-Stopped Eliquis ,premarin ,aspirin ,ibuprofen, ? ?Medications that remain the same after Hospital Discharge:??  ?-All other medications will remain the same.   ? ?Medication Reconciliation was completed by comparing discharge summary, patient?s EMR and Pharmacy list, and upon discussion with patient. ? ?Admitted to the hospital on 12/06/20 due to knee pain,pulmonary embolism. Discharge date was 01/03/21. Discharged from [facility].   ? ?New?Medications Started at [facility] Discharge:?? ?-started Eluquis,carvedilol,lidoderm patches, melatonin,pantoprazole,pravastatin. ? ?Medication Changes at Hospital Discharge: ?-Changed Baclofen,buspirone,oxycodone,prednisone ? ?Medications that remain the same after Hospital Discharge:??  ?-All other medications will remain the same.    ? ?Medications: ?Outpatient Encounter Medications as of 05/10/2021  ?Medication Sig  ? HYDROcodone-acetaminophen (NORCO/VICODIN) 5-325 MG tablet Take 1 tablet by mouth 3 (three) times daily as needed for moderate pain.  ? albuterol (VENTOLIN HFA) 108  (90 Base) MCG/ACT inhaler Inhale 2 puffs into the lungs every 6 (six) hours as needed for wheezing or shortness of breath.  ? budesonide-formoterol (SYMBICORT) 160-4.5 MCG/ACT inhaler Inhale 2 puffs into the lungs 2 (two) times daily.  ? buPROPion (WELLBUTRIN XL) 150 MG 24 hr tablet  Take 150 mg by mouth daily.  ? busPIRone (BUSPAR) 15 MG tablet Take 15 mg by mouth 2 (two) times daily before a meal.  ? cholecalciferol (VITAMIN D) 25 MCG (1000 UNIT) tablet Take 2,000 Units by mouth daily. 2 tablets daily  ? clobetasol cream (TEMOVATE) 0.05 % APPLY SMALL AMOUNT TO AFFECTED AREAS TWO TO THREE TIMES PER WEEK  ? cyanocobalamin 2000 MCG tablet Take 1,000 mcg by mouth daily.  ? diclofenac Sodium (VOLTAREN) 1 % GEL Apply 2 g topically 2 (two) times daily as needed (pain).  ? diphenhydrAMINE (BENADRYL) 25 MG tablet Take 25 mg by mouth every 6 (six) hours as needed for allergies.  ? divalproex (DEPAKOTE ER) 500 MG 24 hr tablet Take 500 mg by mouth at bedtime.   ? fluticasone (FLONASE) 50 MCG/ACT nasal spray PLACE 2 SPRAYS INTO THE NOSE DAILY. (Patient taking differently: Place 2 sprays into both nostrils. PLACE 2 SPRAYS INTO THE NOSE DAILY.)  ? ipratropium-albuterol (DUONEB) 0.5-2.5 (3) MG/3ML SOLN Take 3 mLs by nebulization every 6 (six) hours as needed.  ? iron polysaccharides (NIFEREX) 150 MG capsule Take 150 mg by mouth daily.  ? ketotifen (ZADITOR) 0.025 % ophthalmic solution Place 1 drop into both eyes 2 (two) times daily as needed (allergies).  ? levothyroxine (SYNTHROID) 50 MCG tablet TAKE ONE TABLET BY MOUTH DAILY BEFORE BREAKFAST (Patient taking differently: Take 50 mcg by mouth at bedtime.)  ? LORazepam (ATIVAN) 1 MG tablet Take 1 mg by mouth at bedtime as needed for anxiety or sleep.  ? metoprolol succinate (TOPROL-XL) 25 MG 24 hr tablet Take 0.5 tablets (12.5 mg total) by mouth in the morning and at bedtime.  ? mirabegron ER (MYRBETRIQ) 25 MG TB24 tablet Take 1 tablet (25 mg total) by mouth daily.  ? mirtazapine  (REMERON) 30 MG tablet Take 30 mg by mouth at bedtime.   ? ondansetron (ZOFRAN-ODT) 4 MG disintegrating tablet Take 1 tablet (4 mg total) by mouth every 8 (eight) hours as needed for nausea or vomiting.  ? predniSONE (DELTASONE) 20 MG tablet Take 1 tablet (20 mg total) by mouth daily with breakfast.  ? sertraline (ZOLOFT) 100 MG tablet Take 100 mg by mouth daily.   ? simvastatin (ZOCOR) 20 MG tablet TAKE ONE TABLET BY MOUTH EVERY NIGHT AT BEDTIME  ? ?No facility-administered encounter medications on file as of 05/10/2021.  ? ? ?Lab Results  ?Component Value Date/Time  ? HGBA1C 5.5 08/29/2020 04:12 AM  ?  ? ?BP Readings from Last 3 Encounters:  ?02/08/21 112/69  ?11/16/20 (!) 178/96  ?11/15/20 (!) 149/83  ? ? ? ?Patient contacted to confirm telephone appointment with Al CorpusLindsey Foltanski, Pharm D, on 05/15/21 at 9:00am. ? ?Do you have any problems getting your medications? No ? ? ?What is your top health concern you would like to discuss at your upcoming visit? RA -Medication expense ? ?Have you seen any other providers since your last visit with PCP? No ? ? ? ?Star Rating Drugs:  ?Medication:  Last Fill: Day Supply ?Simvastatin 20mg  10/04/20  90 ? ?Care Gaps: ?Annual wellness visit in last year? Yes ?Most Recent BP reading:112/69  118-P  02/08/21 ? ? ?Al CorpusLindsey Foltanski, CPP notified ? ?Menachem Urbanek, CCMA ?Health concierge  ?[phone]  ?

## 2021-05-10 NOTE — Telephone Encounter (Signed)
Received a fax from [employer] regarding an approval for ENBREL patient assistance from 05/09/21 to 02/25/22.  ? ?Phone number: [phone] ? ?ATC patient to review approval but unable to reach. Left VM and provided with phone number to schedule shipment ?  ?Chesley Miresevki Dimitry Holsworth, PharmD, MPH, BCPS ?Clinical Pharmacist (Rheumatology and Pulmonology)  ?

## 2021-05-10 NOTE — Telephone Encounter (Signed)
Home Health verbal orders ?Caller Name:Nynic ?Agency Name: [facility] ? ?Callback number: [phone] ? ?Requesting OT/PT/Skilled nursing/Social Work/Speech: ? ?Reason:Continue PT ? ?Frequency:2 wks for 4 wks ? ?Please forward to [facility] pool or providers CMA  ?

## 2021-05-10 NOTE — Telephone Encounter (Signed)
Called and gave verbal orders to Nynic for continued PT.  ?

## 2021-05-11 MED ORDER — ENBREL SURECLICK 50 MG/ML ~~LOC~~ SOAJ: 50.0000 mg | SUBCUTANEOUS | 0 refills | Status: DC

## 2021-05-11 NOTE — Telephone Encounter (Signed)
Called patient regarding Enbrel approval. Patient states she has Enbrel shipment arriving to her home tomorrow 05/12/21. Patient does not have f/u appt with Dr. Dimple Casey scheduled. Routing to scheduling team to assist in patient being seen in 6-8 weeks after starting Enbrel ? ?Chesley Mires, PharmD, MPH, BCPS ?Clinical Pharmacist (Rheumatology and Pulmonology) ?

## 2021-05-15 ENCOUNTER — Telehealth: Payer: Self-pay | Admitting: Pharmacist

## 2021-05-15 ENCOUNTER — Telehealth: Payer: Self-pay

## 2021-05-15 ENCOUNTER — Ambulatory Visit (INDEPENDENT_AMBULATORY_CARE_PROVIDER_SITE_OTHER): Payer: Medicare Other | Admitting: Pharmacist

## 2021-05-15 ENCOUNTER — Other Ambulatory Visit: Payer: Self-pay

## 2021-05-15 DIAGNOSIS — N951 Menopausal and female climacteric states: Secondary | ICD-10-CM

## 2021-05-15 DIAGNOSIS — J449 Chronic obstructive pulmonary disease, unspecified: Secondary | ICD-10-CM

## 2021-05-15 DIAGNOSIS — E7800 Pure hypercholesterolemia, unspecified: Secondary | ICD-10-CM

## 2021-05-15 DIAGNOSIS — I10 Essential (primary) hypertension: Secondary | ICD-10-CM

## 2021-05-15 DIAGNOSIS — F319 Bipolar disorder, unspecified: Secondary | ICD-10-CM

## 2021-05-15 DIAGNOSIS — E039 Hypothyroidism, unspecified: Secondary | ICD-10-CM

## 2021-05-15 MED ORDER — BUDESONIDE-FORMOTEROL FUMARATE 160-4.5 MCG/ACT IN AERO: 2.0000 | INHALATION_SPRAY | Freq: Two times a day (BID) | RESPIRATORY_TRACT | 3 refills | Status: DC

## 2021-05-15 NOTE — Progress Notes (Signed)
? ?Chronic Care Management ?Pharmacy Note ? ?05/15/2021 ?Name:  Lori Orozco MRN:  [id number] DOB:  1950-06-08 ? ?Summary: CCM Initial visit ?-Myrbetriq 25 mg works well but is expensive ($70/month); pt wants to be able to take it twice daily but cannot afford ?-Pt cannot afford Symbicort ?-Pt reports overall financial burden of medications; based on reported income she may qualify for Full Medicare Extra Help ?-Pt has been taking Premarin pills for hot flashes; these were originally stopped after PE 11/2020 but pt restarted on her own. Discussed VTE risk at length. ? ?Recommendations/Changes made from today's visit: ?-Consider increase Myrbetriq to 50 mg; monitor for BP elevation (PCP appt 3/21) ?-[place] in [place] for Symbicort. Refilled medication to [facility] ?-Provided SSA website and phone number for patient to inquire about Extra Help ?-Advised pt to stop Premarin and schedule f/u appt with OB/GYN for further management ? ?Plan: ?-[place] will call patient 1 months for BP review ?-Pharmacist follow up televisit scheduled for 6 months ?-PCP visit 05/16/21 ? ? ?Subjective: ?Lori Orozco is an 71 y.o. year old female who is a primary patient of Orozco, Lori Fanny, MD.  The CCM team was consulted for assistance with disease management and care coordination needs.   ? ?Engaged with patient by telephone for initial visit in response to provider referral for pharmacy case management and/or care coordination services.  ? ?Consent to Services:  ?The patient was given the following information about Chronic Care Management services today, agreed to services, and gave verbal consent: 1. CCM service includes personalized support from designated clinical staff supervised by the primary care provider, including individualized plan of care and coordination with other care providers 2. 24/7 contact phone numbers for assistance for urgent and routine care needs. 3. Service will only  be billed when office clinical staff spend 20 minutes or more in a month to coordinate care. 4. Only one practitioner may furnish and bill the service in a calendar month. 5.The patient may stop CCM services at any time (effective at the end of the month) by phone call to the office staff. 6. The patient will be responsible for cost sharing (co-pay) of up to 20% of the service fee (after annual deductible is met). Patient agreed to services and consent obtained. ? ?Patient Care Team: ?Orozco, Lori Fanny, MD as PCP - General ?Shon Hough, MD as Consulting Physician (Ophthalmology) ?Pearson Grippe, MD as Referring Physician (Psychiatry) ?Emily Filbert, MD (Inactive) as Consulting Physician (Obstetrics and Gynecology) ?Deneise Lever, MD as Consulting Physician (Pulmonary Disease) ?Mcarthur Rossetti, MD as Consulting Physician (Orthopedic Surgery) ?Newt Minion, MD as Consulting Physician (Orthopedic Surgery) ?Magnus Sinning, MD as Consulting Physician (Physical Medicine and Rehabilitation) ?Melinda Crutch, MD as Referring Physician (Otolaryngology) ?Mel Almond, PhD as Referring Physician (Psychology) ?Kasumi Ditullio, Cleaster [place], [facility] as Pharmacist (Pharmacist) ? ?Recent office visits: ?03/01/21-PCP-Marne Tower,MD VV: Hospital f/u (Covid). Need to get Symbicort covered. ? ?02/10/21-PCP-Marne Tower,MD-Telemedicine- Hospital follow up -Continue PT in home No medication changes ? ?Recent consult visits: ?02/25/21-Internal Medicine-Padageshwar Sunkara,MD- Telemedicine hospital follow up- no medication changes ?02/08/21-Rheumatology-Christopher Rice,MD-Patient presented for follow up rheumatoid arthritis.Recommending Enbrel start for highly active seronegative arthritis(labs reviewed)No improvement to hydroxychloroquine and side effects with methotrexate. ? ?02/02/21-Orthopedic Surgery-Gil Clark,PA-Patient presented for right knee pain.Injection with prednisone and lidocaine. ? ?11/16/20-OB-GYN-Ugonna  Anyanwu,MD-Patient presented for follow up lichen, sclerosus and menopausal symptoms.- clobetasol cream, (PREMARIN) 0.625 MG tablet ? ?11/15/20-Rheumatolog-Christopher Rice,MD- Patient presented for follow up.Will discontinue methotrexate and start  hydroxychloroquine 400 mg PO daily.also recommend restarting prednisone at 40 mg PO daily for 10 days if helpful can taper otherwise discontinue. ? ?Hospital visits: ?Medication Reconciliation was completed by comparing discharge summary, patient?s EMR and Pharmacy list, and upon discussion with patient. ?  ?Admitted to the hospital on 02/13/21 due to Hypoxic respiratory failure. Discharge date was 02/23/21. Discharged from [facility].   ?-acute COVID, COPD, CAP, recent PE ?-cannot tolerate anticoagulation (HGB drop requiring transfusion) ?  ?Medication Changes at Hospital Discharge: ?-Changed to Symbicort inhaler twice a day , iron supplement daily , Metoprolol twice a day, pantoprazole once daily  ?  ?Medications Discontinued at Hospital Discharge: ?-Stopped Eliquis ,premarin ,aspirin ,ibuprofen, ?  ?Medications that remain the same after Hospital Discharge:??  ?-All other medications will remain the same.   ?  ?Admitted to the hospital on 12/06/20 due to knee pain,pulmonary embolism. Discharge date was 01/03/21. Discharged from [facility].   ?  ?New?Medications Started at [facility] Discharge:?? ?-started Eluquis,carvedilol,lidoderm patches, melatonin,pantoprazole,pravastatin. ?  ?Medication Changes at Hospital Discharge: ?-Changed Baclofen,buspirone,oxycodone,prednisone ?  ?Medications that remain the same after Hospital Discharge:??  ?-All other medications will remain the same.    ?  ? ? ?Objective: ? ?Lab Results  ?Component Value Date  ? CREATININE 1.09 (H) 02/08/2021  ? BUN 24 02/08/2021  ? GFR 55.27 (L) 07/08/2020  ? EGFR 55 (L) 02/08/2021  ? GFRNONAA >60 09/06/2020  ? GFRAA >60 01/29/2015  ? NA 135 02/08/2021   ? K 4.4 02/08/2021  ? CALCIUM 9.2 02/08/2021  ? CO2 29 02/08/2021  ? GLUCOSE 111 (H) 02/08/2021  ? ? ?Lab Results  ?Component Value Date/Time  ? HGBA1C 5.5 08/29/2020 04:12 AM  ? GFR 55.27 (L) 07/08/2020 09:11 AM  ? GFR 52.54 (L) 07/03/2019 12:09 PM  ?  ?Last diabetic Eye exam: No results found for: HMDIABEYEEXA  ?Last diabetic Foot exam: No results found for: HMDIABFOOTEX  ? ?Lab Results  ?Component Value Date  ? CHOL 192 07/08/2020  ? HDL 70.30 07/08/2020  ? LDLCALC 101 (H) 07/08/2020  ? TRIG 101.0 07/08/2020  ? CHOLHDL 3 07/08/2020  ? ? ?Hepatic Function Latest Ref Rng & Units 02/08/2021 09/20/2020 09/05/2020  ?Total Protein 6.1 - 8.1 g/dL 7.0 6.6 5.6(L)  ?Albumin 3.5 - 5.0 g/dL - - 2.2(L)  ?AST 10 - 35 U/L 44(H) - 23  ?ALT 6 - 29 U/L 23 - 18  ?Alk Phosphatase 38 - 126 U/L - - 68  ?Total Bilirubin 0.2 - 1.2 mg/dL 0.3 - 0.4  ?Bilirubin, Direct 0.0 - 0.3 mg/dL - - -  ? ? ?Lab Results  ?Component Value Date/Time  ? TSH 1.584 09/04/2020 08:34 AM  ? TSH 4.16 07/08/2020 09:11 AM  ? TSH 2.03 07/03/2019 12:09 PM  ? FREET4 1.08 11/19/2011 03:23 PM  ? ? ?CBC Latest Ref Rng & Units 02/08/2021 09/06/2020 09/05/2020  ?WBC 3.8 - 10.8 Thousand/uL 12.1(H) 8.5 8.4  ?Hemoglobin 11.7 - 15.5 g/dL 8.4(L) 10.2(L) 10.7(L)  ?Hematocrit 35.0 - 45.0 % 27.3(L) 32.0(L) 34.6(L)  ?Platelets 140 - 400 Thousand/uL 635(H) 333 309  ? ? ?Lab Results  ?Component Value Date/Time  ? VD25OH 52.85 07/08/2020 09:11 AM  ? VD25OH 69.35 07/03/2019 12:09 PM  ? ? ?Clinical ASCVD: No  ?The 10-year ASCVD risk score (Arnett DK, et al., 2019) is: 21.9% ?  Values used to calculate the score: ?    Age: 56 years ?    Sex: Female ?  Is Non-Hispanic African American: No ?    Diabetic: No ?    Tobacco smoker: Yes ?    Systolic Blood Pressure: 338 mmHg ?    Is BP treated: Yes ?    HDL Cholesterol: 70.3 mg/dL ?    Total Cholesterol: 192 mg/dL   ? ?Depression screen [facility] 2/9 07/08/2020 06/30/2018 06/18/2017  ?Decreased Interest 0 0 0  ?Down, Depressed, Hopeless 0 0 0  ?PHQ - 2  Score 0 0 0  ?Altered sleeping - 0 0  ?Tired, decreased energy - 0 0  ?Change in appetite - 0 0  ?Feeling bad or failure about yourself  - 0 0  ?Trouble concentrating - 0 0  ?Moving slowly or fidgety/restless - 0 0  ?S

## 2021-05-15 NOTE — Patient Instructions (Signed)
Visit Information ? ?Phone number for Pharmacist: [phone] ? ?Thank you for meeting with me to discuss your medications! I look forward to working with you to achieve your health care goals. Below is a summary of what we talked about during the visit: ? ? Goals Addressed   ?None ?  ? ? ?Care Plan : [place]  ?Updates made by Charlton Haws, RPH since 05/15/2021 12:00 AM  ?  ? ?Problem: Hypertension, Hyperlipidemia, COPD, Chronic Kidney Disease, Hypothyroidism, and Overactive Bladder, RA   ?Priority: High  ?  ? ?Long-Range Goal: Disease mgmt   ?Start Date: 05/15/2021  ?Expected End Date: 05/16/2022  ?This Visit's Progress: On track  ?Priority: High  ?Note:   ?Current Barriers:  ?Unable to independently afford treatment regimen ?Unable to independently monitor therapeutic efficacy ? ?Pharmacist Clinical Goal(s):  ?Patient will verbalize ability to afford treatment regimen ?achieve adherence to monitoring guidelines and medication adherence to achieve therapeutic efficacy through collaboration with PharmD and provider.  ? ?Interventions: ?1:1 collaboration with Tower, Wynelle Fanny, MD regarding development and update of comprehensive plan of care as evidenced by provider attestation and co-signature ?Inter-disciplinary care team collaboration (see longitudinal plan of care) ?Comprehensive medication review performed; medication list updated in electronic medical record ? ?Hypertension / CKD Stage 3a (BP goal <130/80) ?-Not ideally controlled - BP is often above goal at home per pt report; she reports compliance with metoprolol - this was started in hospital due to frequent runs of sinus tachycardia ?-Current home BP readings: SBP 120s-140s ?-Denies hypotensive/hypertensive symptoms ?-Current treatment: ?Metoprolol succinate 25 mg - 1/2 tab BID - Appropriate, Effective, Safe, Accessible ?-Medications previously tried: amlodipine, furosemide  ?-Current dietary habits: not discussed ?-Current exercise  habits: limited ?-Educated on BP goals and benefits of medications for prevention of heart attack, stroke and kidney damage; Importance of home blood pressure monitoring; Symptoms of hypotension and importance of maintaining adequate hydration; ?-Counseled to monitor BP at home daily, document, and provide log at future appointments ?-Recommended to continue current medication ? ?Hyperlipidemia: (LDL goal < 100) ?-Controlled - LDL 101 (06/2020); pt endorses compliance with statin ?-Current treatment: ?Simvastatin 20 mg daily HS - Appropriate, Effective, Safe, Accessible ?-Medications previously tried: n/a  ?-Educated on Cholesterol goals; Benefits of statin for ASCVD risk reduction; ?-Recommended to continue current medication ? ?COPD (Goal: control symptoms and prevent exacerbations) ?-Not ideally controlled - pt reports sx are reasonably controlled with albuterol and duoneb, she is not able to afford Symbicort ?-Gold Grade: unable to assess ?-Current COPD Classification:  A (low sx, <2 exacerbations/yr) ?-MMRC/CAT score: 10/27/10- FEV1 1.54, FEV1/FVC 0.63, FEF 25-75% 0.84 ?-Pulmonary function testing: not on file ?-Exacerbations requiring treatment in last 6 months: 1 ?-Current treatment  ?Albuterol HFA -Appropriate, Effective, Safe, Accessible ?Symbicort 160-4.5 mcg/act 2 puff BID -Appropriate, Effective, Safe, Inaccessible ?Duoneb PRN -Appropriate, Effective, Safe, Accessible ?-Medications previously tried: n/a  ?-Patient denies consistent use of maintenance inhaler ?-Frequency of rescue inhaler use: 1-2x daily ?-Counseled on Proper inhaler technique; Benefits of consistent maintenance inhaler use ?-Assessed patient finances. She qualifies to Symbicort PAP; enrolled her in [place] via online portal; refilled Rx to [facility] pharmacy via office refill protocol ?-Recommend to continue current medication ? ?Bipolar disorder / Depression / Anxiety (Goal: manage symptoms) ?-Controlled - per patient report; she reports  every time they try to change her meds "something goes wrong" and they change it back ?-PHQ9: 0 (06/2020) - minimal depression ?-GAD7: not on file ?-Connected with Dr Pearson Grippe for mental  health support ?-Current treatment: ?Bupropion XL 150 mg daily -Appropriate, Effective, Safe, Accessible ?Buspirone 15 mg BID -Appropriate, Effective, Safe, Accessible ?Mirtazapine 30 mg HS -Appropriate, Effective, Safe, Accessible ?Sertraline 100 mg daily -Appropriate, Effective, Safe, Accessible ?Divalproex ER 500 mg daily HS -Appropriate, Effective, Safe, Accessible ?Lorazepam 1 mg HS prn -Appropriate, Effective, Safe, Accessible ?-Medications previously tried/failed: n/a ?-Educated on Benefits of medication for symptom control ?-Recommended to continue current medication ? ?Osteoporosis screening (Goal prevent fractures) ?-Controlled ?-Last DEXA Scan: 06/2017  ? T-Score femoral neck: -0.4 ? T-Score lumbar spine: +0.4 ? 10-year probability of major osteoporotic fracture: n/a ? 10-year probability of hip fracture: n/a ?-Patient is not a candidate for pharmacologic treatment ?-Current treatment  ?Vitamin D 1000 IU - 2 daily - Appropriate, Effective, Safe, Accessible ?-Medications previously tried: n/a  ?-Recommend weight-bearing and muscle strengthening exercises for building and maintaining bone density. ? ?Hypothyroidism (Goal: maintain TSH in goal range) ?-Controlled - TSH 1.58 (08/2020) ?-Current treatment  ?Levothyroxine 50 mcg HS Appropriate, Effective, Safe, Accessible ?-Medications previously tried: n/a  ?-Recommended to continue current medication ? ?Anemia (Goal: maintain HGB) ?-Not ideally controlled - HGB 9.7 (02/25/2021) was low but improving at end of hospitalization in Dec ?-Current treatment  ?Vitamin B12 2000 mcg daily - Appropriate, Query Effective ?Niferex (iron) 150 mg daily -Appropriate, Query Effective ?-Medications previously tried: n/a  ?-Recommend to continue current medication; repeat CBC ? ?OAB (Goal:  manage symptoms) ?-Not ideally controlled - pt reports Myrbetriq works well for urinary symptoms, she takes at night but it wears off some during the day; she wants to take this twice daily but cannot afford it (copay is $72 per month) ?-Current treatment  ?Myrbetriq ER 25 mg daily - Appropriate, Query Effective ?-Medications previously tried: n/a  ?-Discussed copay is the same regardless of dose; some concern for BP elevation with increasing dose ?-Consider increasing Myrbetriq to 50 mg; monitor BP closely with increase - pt to discuss with PCP at appt 3/21 ? ?Rheumatoid Arthritis (Goal: manage pain) ?-Query controlled - pt is starting Enbrel today after long insurance/assistance process ?-Follows with Dr Benjamine Mola [facility]) ?-Current treatment  ?Voltaren gel - Appropriate, Effective, Safe, Accessible ?Hydrocodone-APAP 5-325 mg TID PRN -Appropriate, Effective, Safe, Accessible ?Enbrel 50 mg weekly -Appropriate, Query Effective ?Prednisone 20 mg daily - Appropriate, Effective, Query Safe ?-Medications previously tried: plaquenil, methotrexate  ?-Hopefully Enbrel improves status and allows use of less steroids ?-Recommended to continue current medication ? ?Postmenopausal symptoms (Goal: manage symptoms) ?-Not ideally controlled - pt reports she was prescribed Premarin per OB/GYN, but was advised to stop after PE in Oct 2022; she has since resumed taking Premarin due to uncontrolled hot flashes ?-Current treatment  ?Premarin tablets - Query appropriate ?-Medications previously tried: n/a ?-Discussed risk of VTE "clot" with hormone replacement; 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; furthermore pt has not been able to tolerate anticoagulation so would consider her risk even higher for developing a clot  ?-Advised to stop Premarin immediately and make appt with OB/GYN for further management of symptoms ? ?Health Maintenance ?-Vaccine gaps: Shingrix #2, Covid booster, Flu ?-Hx  PE 11/2020, unable to tolerate AC due to HGB drop requiring transfusion ?-Medication cost issues: pt may qualify for LIS (Extra Help); gave her ssa.gov and phone number to apply ?-Current therapy:  ?Vitamin B12

## 2021-05-15 NOTE — Telephone Encounter (Signed)
Pt reports Myrbetriq 25 mg is effective for urinary symptoms, but expensive. She would like to take 2 tablets per day but cannot afford to. ? ?Myrbetriq will be same co-pay regardless of dose- may consider increasing to 50 mg dose. BP will require close monitoring. ? ?Patient has PCP visit 3/21 and plans to discuss. ? ?

## 2021-05-15 NOTE — Telephone Encounter (Signed)
Will plan on it, thanks  ?

## 2021-05-15 NOTE — Telephone Encounter (Signed)
Lori Orozco from [facility] ?Ok to leave message on [phone] ? ?Requesting extension of occupation there ?2x week for one week  ?1x week for one week  ?Start day today  ?

## 2021-05-16 ENCOUNTER — Encounter: Payer: Self-pay | Admitting: Family Medicine

## 2021-05-16 ENCOUNTER — Ambulatory Visit (INDEPENDENT_AMBULATORY_CARE_PROVIDER_SITE_OTHER): Payer: Medicare Other | Admitting: Family Medicine

## 2021-05-16 VITALS — BP 131/70 | HR 88

## 2021-05-16 DIAGNOSIS — M0600 Rheumatoid arthritis without rheumatoid factor, unspecified site: Secondary | ICD-10-CM

## 2021-05-16 DIAGNOSIS — D649 Anemia, unspecified: Secondary | ICD-10-CM | POA: Diagnosis not present

## 2021-05-16 DIAGNOSIS — N3941 Urge incontinence: Secondary | ICD-10-CM

## 2021-05-16 DIAGNOSIS — E039 Hypothyroidism, unspecified: Secondary | ICD-10-CM

## 2021-05-16 DIAGNOSIS — M1710 Unilateral primary osteoarthritis, unspecified knee: Secondary | ICD-10-CM

## 2021-05-16 DIAGNOSIS — Z86711 Personal history of pulmonary embolism: Secondary | ICD-10-CM

## 2021-05-16 DIAGNOSIS — Q2730 Arteriovenous malformation, site unspecified: Secondary | ICD-10-CM

## 2021-05-16 DIAGNOSIS — I10 Essential (primary) hypertension: Secondary | ICD-10-CM | POA: Diagnosis not present

## 2021-05-16 DIAGNOSIS — J449 Chronic obstructive pulmonary disease, unspecified: Secondary | ICD-10-CM

## 2021-05-16 MED ORDER — MIRABEGRON ER 50 MG PO TB24: 50.0000 mg | ORAL_TABLET | Freq: Every day | ORAL | 0 refills | Status: DC

## 2021-05-16 NOTE — Assessment & Plan Note (Signed)
Per records, in ascending colon  ?Has had colonoscopy and EGD ?Unable to anticoag as she starts bleeding again  ?Pt is due for a GI visit [facility]aware it will take a while to get a new appointment ?No clinical changes and no abdominal pain currently ?CBC today ?Patient continues Niferex 150 mg daily and will call when she needs a refill ?

## 2021-05-16 NOTE — Assessment & Plan Note (Signed)
TSH ordered ?Patient takes levothyroxine 50 mcg daily ?

## 2021-05-16 NOTE — Assessment & Plan Note (Signed)
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

## 2021-05-16 NOTE — Assessment & Plan Note (Signed)
Started Enbrel yesterday ?Currently taking prednisone 20 mg daily ?Pain is severe at times ?

## 2021-05-16 NOTE — Assessment & Plan Note (Signed)
Per patient stable ?Was able to get some Symbicort ?Recovered from COVID and breathing is baseline ?

## 2021-05-16 NOTE — Assessment & Plan Note (Signed)
bp in fair control at this time  ?BP Readings from Last 1 Encounters:  ?05/16/21 131/70  ? ?No changes needed ?Most recent labs reviewed  ?Disc lifstyle change with low sodium diet and exercise  ?Plan to continue metoprolol XL 12.5 mg twice daily ?

## 2021-05-16 NOTE — Assessment & Plan Note (Signed)
Patient requires 50 mg of Myrbetriq for symptom control ?When she needs a refill we will send it in at 50 instead of 25 which is more affordable than taking to 25 ?

## 2021-05-16 NOTE — Telephone Encounter (Signed)
Called spoke w/ Molly Maduro [facility] gave verbal okay , for OT  . ?

## 2021-05-16 NOTE — Progress Notes (Signed)
? ?Subjective:  ? ? Patient ID: Lori Orozco, female    DOB: April 12, 1950, 71 y.o.   MRN: [id number] ? ?This visit occurred during the SARS-CoV-2 public health emergency.  Safety protocols were in place, including screening questions prior to the visit, additional usage of staff PPE, and extensive cleaning of exam room while observing appropriate contact time as indicated for disinfecting solutions.  ? ?HPI ?Pt presents for f/u of last hosp and chronic health problems  ? ?Wt Readings from Last 3 Encounters:  ?02/10/21 151 lb (68.5 kg)  ?11/16/20 192 lb (87.1 kg)  ?11/15/20 194 lb (88 kg)  ? ?Cannot weigh today  ? ?This is her first visit out since hospital  ? ? ?Last hosp was for covid pneumonia and PE  ? ?Home health is coming out  ?Slow but doing well  ?Is thrilled with stand up walker  ? ?HTN ?bp is stable today  ?No cp or palpitations or headaches or edema  ?No side effects to medicines  ?BP Readings from Last 3 Encounters:  ?05/16/21 (!) 156/82  ?02/08/21 112/69  ?11/16/20 (!) 178/96  ?   ?Lab Results  ?Component Value Date  ? CREATININE 1.09 (H) 02/08/2021  ? BUN 24 02/08/2021  ? NA 135 02/08/2021  ? K 4.4 02/08/2021  ? CL 94 (L) 02/08/2021  ? CO2 29 02/08/2021  ? ? ? ?COPD ?Rescue inhaler  ?Mt inhaler ?Breathing is pretty good - working on getting symbicort  ? ?RA and OA-- very bad  ?Prednisone  ?Had first enbrel shot yesterday  ? ? 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 WBC 12.1 (H) 02/08/2021  ? HGB 8.4 (L) 02/08/2021  ? HCT 27.3 (L) 02/08/2021  ? MCV 89.5 02/08/2021  ? PLT 635 (H) 02/08/2021  ? ?Nu iron  ? ?Was previously on estrogen  ? ?Was on myrbetriq 25 for urinary symptoms but requires 2 and it is expensive  ? ?Patient Active Problem List  ? Diagnosis Date Noted  ? 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 History of pulmonary embolism 12/08/2020  ? Arthritis of knee 12/06/2020  ? Bilateral knee pain  09/20/2020  ? 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  ? High risk medication use 09/20/2020  ? Extensor tenosynovitis of right wrist 09/15/2020  ? Wrist pain 08/30/2020  ? Pain and swelling of right wrist 08/28/2020  ? S/P reverse total shoulder arthroplasty, 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  ? Lung nodule < 6cm on CT 01/02/2020  ? COVID-19 03/31/2019  ? Status post arthroscopy of left shoulder 01/16/2018  ? Estrogen deficiency 06/28/2017  ? Medial epicondylitis, left elbow 04/29/2017  ? S/P arthroscopy of left shoulder 02/25/2017  ? Impingement syndrome of left shoulder 01/30/2017  ? Pedal edema 10/26/2016  ? Trochanteric bursitis, right hip 04/10/2016  ? Essential hypertension 09/02/2015  ? Urge incontinence 07/27/2015  ? Obesity 04/28/2015  ? Need for hepatitis C screening test 04/26/2015  ? Screening for HIV (human immunodeficiency virus) 04/26/2015  ? Osteoarthritis of left knee 01/28/2015  ? Status post total left knee replacement 01/28/2015  ? Vitamin D deficiency 04/27/2014  ? Seasonal and perennial allergic rhinitis 06/17/2013  ? S/P total hip arthroplasty 03/20/2013  ? Medicare annual wellness visit, subsequent 11/18/2012  ? History of falling 11/18/2012  ? Routine general medical examination at a health care facility 11/10/2012  ? Lichen sclerosus et atrophicus of the vulva 07/29/2012  ? Osteopenia 10/19/2011  ?  Other screening mammogram 10/19/2011  ? Hypothyroidism 10/19/2011  ? ANEMIA 10/11/2009  ? PERIPHERAL NEUROPATHY, FEET 08/05/2009  ? HAND PAIN, BILATERAL 08/05/2009  ? TREMOR 03/09/2008  ? MENOPAUSAL SYNDROME 10/09/2007  ? DEPRESSION, MAJOR 05/20/2007  ? BIPOLAR AFFECTIVE DISORDER 05/20/2007  ? Generalized anxiety disorder 05/20/2007  ? PERSONALITY DISORDER 05/20/2007  ? ESOPHAGEAL SPASM 05/20/2007  ? Diaphragmatic hernia 05/20/2007  ? AMAUROSIS FUGAX 05/07/2007  ? COPD mixed type (HCC) 03/27/2007  ? HYPERCHOLESTEROLEMIA, PURE 12/10/2006  ? SYMPTOM, SYNDROME, CHRONIC FATIGUE 12/10/2006  ? ?Past Medical  History:  ?Diagnosis Date  ? Amaurosis fugax   ? Anemia   ? hx  ? Anxiety   ? Arthritis   ? Asthma   ? Cataract   ? Chronic fatigue syndrome   ? Chronic kidney disease   ? frequency, nephritis when 71 yrs old  ? COPD (chronic obstructive pulmonary disease) (HCC)   ? COVID-19   ? Depression   ? Diverticulitis   ? Emphysema of lung (HCC)   ? Fibromyalgia   ? GERD (gastroesophageal reflux disease)   ? occ  ? H/O hiatal hernia   ? History of bronchitis   ? Hyperlipidemia   ? Hypothyroidism   ? Interstitial cystitis   ? Irritable bowel syndrome   ? Lichen sclerosus   ? Lumbar herniated disc   ? Migraines   ? Pneumonia   ? PONV (postoperative nausea and vomiting)   ? Shortness of breath   ? exertion   ? Thyroid disease   ? Graves  ? Urinary frequency   ? Urinary tract infection   ? Vertigo   ? ?Past Surgical History:  ?Procedure Laterality Date  ? ABDOMINAL HYSTERECTOMY    ? bladder sugery     ? CAROTID DOPPLAR    ? COLONOSCOPY  05/26/2010  ? avms- otherwise nl , re check 10y  ? DEXA-OSTEOPENIA    ? DOPPLER ECHOCARDIOGRAPHY    ? elbow surgery    ? EPICONDYLITIS    ? EYE SURGERY Bilateral   ? cataracts  ? FOOT SURGERY Bilateral   ? I & D EXTREMITY Right 09/01/2020  ? Procedure: TYNOSYNOVECTOMY;  Surgeon: [name], MD;  [facility];  Service: Orthopedics;  Laterality: Right;  ? KNEE SURGERY    ? Left cartilage  ? lipoma in second finger right hand    ? PLANTAR FASCIA SURGERY Left   ? REVERSE SHOULDER ARTHROPLASTY Left 02/02/2020  ? Procedure: LEFT REVERSE SHOULDER ARTHROPLASTY;  Surgeon: [name], MD;  Location: [facility] OR;  Service: Orthopedics;  Laterality: Left;  ? ROTATOR CUFF REPAIR Left 12/2017  ? TEMPOROMANDIBULAR JOINT SURGERY    ? TONSILLECTOMY    ? TOTAL HIP ARTHROPLASTY Right 03/20/2013  ? Procedure: Right TOTAL HIP ARTHROPLASTY;  Surgeon: [name], MD;  Location: [facility] OR;  Service: Orthopedics;  Laterality: Right;  Right Total Hip Arthroplasty  ? TOTAL KNEE ARTHROPLASTY Left 01/28/2015  ?  Procedure: LEFT TOTAL KNEE ARTHROPLASTY;  Surgeon: [name], MD;  Location: [facility] ORS;  Service: Orthopedics;  Laterality: Left;  ? TRIGGER FINGER RELEASE Right   ? TROCHANTERIC BURSA EXCISION Right 05/03/2016  ? Dr. [name], [name]  ? WRIST ARTHROSCOPY Right   ? ligament tear  ? ?Social History  ? ?Tobacco Use  ? Smoking status: Former  ?  Packs/day: 2.00  ?  Years: 40.00  ?  Pack years: 80.00  ?  Types: E-cigarettes, Cigarettes  ?  Quit date: 2015  ?  Years since quitting: 8.2  ? Smokeless tobacco: Never  ?Vaping Use  ? Vaping Use: Former  ? Quit date: 11/26/2020  ? Devices: no nicotine in it  ?Substance Use Topics  ? Alcohol use: No  ?  Alcohol/week: 0.0 standard drinks  ? Drug use: No  ? ?Family History  ?Problem Relation Age of Onset  ? Arthritis Mother   ? Hypertension Mother   ? Kidney disease Mother   ? Cancer Father   ?     Bladder  ? Arthritis Sister   ? Colon cancer Other   ? Cancer - Lung Cousin   ? Arthritis Maternal Grandmother   ? ?Allergies  ?Allergen Reactions  ? Lithium Anaphylaxis  ? Tegretol [Carbamazepine] Other (See Comments)  ?  Fever and body aches (fever over 103)  ? Tricyclic Antidepressants Anaphylaxis  ?  Other reaction(s): Unknown  ? Methotrexate Derivatives Other (See Comments)  ?  Urinary retention and dizziness  ?Other reaction(s): Other ?UTI  ? Strawberry Extract Hives  ? Codeine Nausea Only  ?  Makes pt stay awake  ? Cymbalta [Duloxetine Hcl] Other (See Comments)  ?  Makes pt pass out ?  ? Erythromycin   ?  abdominal pain  ? Lyrica [Pregabalin]   ?  Felt faint  ? Neurontin [Gabapentin]   ?  Passes  out  ? Rabeprazole Sodium   ?  insomnia  ? Duraprep [employer], Misc.] Itching and Rash  ?  Tolerates Betadine ?  ? Penicillins Rash  ?  Tolerated ANCEF 02/02/20 ? ?Has patient had a PCN reaction causing immediate rash, facial/tongue/throat swelling, SOB or lightheadedness with hypotension: Yes ?Has patient had a PCN reaction causing severe rash involving mucus  membranes or skin necrosis: No ?Has patient had a PCN reaction that required hospitalization No ?Has patient had a PCN reaction occurring within the last 10 years: No ?If all of the above answers are "NO", then

## 2021-05-16 NOTE — Assessment & Plan Note (Signed)
Pending upcoming orthopedic visit ?Takes hydrocodone ?

## 2021-05-16 NOTE — Patient Instructions (Addendum)
I will place a referral to GI in [place]  ? ?Once labs are back I can send the iron in depending on what we do with it  ? ?Labs today  ? ?Plan from there when I get results  ? ?

## 2021-05-16 NOTE — Assessment & Plan Note (Signed)
Unable to anticoagulate due to chronic GI bleeding ?

## 2021-05-17 LAB — CBC WITH DIFFERENTIAL/PLATELET
Basophils Absolute: 0.3 10*3/uL — ABNORMAL HIGH (ref 0.0–0.1)
Basophils Relative: 2.1 % (ref 0.0–3.0)
Eosinophils Absolute: 0.1 10*3/uL (ref 0.0–0.7)
Eosinophils Relative: 0.4 % (ref 0.0–5.0)
HCT: 32.8 % — ABNORMAL LOW (ref 36.0–46.0)
Hemoglobin: 10.5 g/dL — ABNORMAL LOW (ref 12.0–15.0)
Lymphocytes Relative: 38.7 % (ref 12.0–46.0)
Lymphs Abs: 5.5 10*3/uL — ABNORMAL HIGH (ref 0.7–4.0)
MCHC: 32.1 g/dL (ref 30.0–36.0)
MCV: 92.7 fl (ref 78.0–100.0)
Monocytes Absolute: 1.1 10*3/uL — ABNORMAL HIGH (ref 0.1–1.0)
Monocytes Relative: 7.4 % (ref 3.0–12.0)
Neutro Abs: 7.3 10*3/uL (ref 1.4–7.7)
Neutrophils Relative %: 51.4 % (ref 43.0–77.0)
Platelets: 447 10*3/uL — ABNORMAL HIGH (ref 150.0–400.0)
RBC: 3.54 Mil/uL — ABNORMAL LOW (ref 3.87–5.11)
RDW: 16.8 % — ABNORMAL HIGH (ref 11.5–15.5)
WBC: 14.2 10*3/uL — ABNORMAL HIGH (ref 4.0–10.5)

## 2021-05-17 LAB — COMPREHENSIVE METABOLIC PANEL
ALT: 46 U/L — ABNORMAL HIGH (ref 0–35)
AST: 29 U/L (ref 0–37)
Albumin: 3.6 g/dL (ref 3.5–5.2)
Alkaline Phosphatase: 71 U/L (ref 39–117)
BUN: 29 mg/dL — ABNORMAL HIGH (ref 6–23)
CO2: 28 mEq/L (ref 19–32)
Calcium: 9.6 mg/dL (ref 8.4–10.5)
Chloride: 98 mEq/L (ref 96–112)
Creatinine, Ser: 1.13 mg/dL (ref 0.40–1.20)
GFR: 49.15 mL/min — ABNORMAL LOW (ref >60.00)
Glucose, Bld: 90 mg/dL (ref 70–99)
Potassium: 4.9 mEq/L (ref 3.5–5.1)
Sodium: 138 mEq/L (ref 135–145)
Total Bilirubin: 0.3 mg/dL (ref 0.2–1.2)
Total Protein: 6.9 g/dL (ref 6.0–8.3)

## 2021-05-17 LAB — TSH: TSH: 5.59 u[IU]/mL — ABNORMAL HIGH (ref 0.35–5.50)

## 2021-05-17 LAB — FERRITIN: Ferritin: 589.1 ng/mL — ABNORMAL HIGH (ref 10.0–291.0)

## 2021-05-17 LAB — IRON: Iron: 43 ug/dL (ref 42–145)

## 2021-05-22 ENCOUNTER — Other Ambulatory Visit: Payer: Self-pay

## 2021-05-22 ENCOUNTER — Encounter: Payer: Self-pay | Admitting: Physician Assistant

## 2021-05-22 ENCOUNTER — Ambulatory Visit (INDEPENDENT_AMBULATORY_CARE_PROVIDER_SITE_OTHER): Payer: Medicare Other | Admitting: Physician Assistant

## 2021-05-22 DIAGNOSIS — M1711 Unilateral primary osteoarthritis, right knee: Secondary | ICD-10-CM

## 2021-05-22 MED ORDER — METHYLPREDNISOLONE ACETATE 40 MG/ML IJ SUSP
40.0000 mg | INTRAMUSCULAR | Status: AC | PRN
  Administered 2021-05-22: 40 mg via INTRA_ARTICULAR

## 2021-05-22 MED ORDER — LIDOCAINE HCL 1 % IJ SOLN
3.0000 mL | INTRAMUSCULAR | Status: AC | PRN
  Administered 2021-05-22: 3 mL

## 2021-05-22 MED ORDER — HYDROCODONE-ACETAMINOPHEN 5-325 MG PO TABS: 1.0000 | ORAL_TABLET | Freq: Three times a day (TID) | ORAL | 0 refills | Status: DC | PRN

## 2021-05-22 NOTE — Progress Notes (Signed)
? ?  Procedure Note ? ?Patient: Lori Orozco             ?Date of Birth: 1950-12-26           ?MRN: [id number]             ?Visit Date: 05/22/2021 ?Lori Orozco comes in today due to right knee pain.  She has mild knee arthritis on radiographs.  She was last seen 02/02/2021 and was given a cortisone injection aspiration knee at that time and gave her minimal relief.  She unfortunately contracted COVID and has been somewhat bedridden.  Her knee pain is becoming worse.  She notes clicking about the kneecap.  No mechanical symptoms no known injury.  She does have rheumatoid arthritis.  Been taking hydrocodone due to the pain.  She is requesting a refill on this. ? ?Physical exam: General patient's frail-appearing female in no acute distress. ?Psych: Alert and oriented x3 ?Right knee patellofemoral crepitus.  There is definite maltracking of the patella.  Quad atrophy.  No gross instability valgus varus stress in the right knee.  No effusion abnormal warmth erythema right knee. ? ? ?Procedures: ?Visit Diagnoses:  ?1. Chronic pain of right knee   ? ? ?Large Joint Inj: R knee on 05/22/2021 5:11 PM ?Indications: pain ?Details: 22 G 1.5 in needle, anterolateral approach ? ?Arthrogram: No ? ?Medications: 3 mL lidocaine 1 %; 40 mg methylPREDNISolone acetate 40 MG/ML ?Outcome: tolerated well, no immediate complications ?Procedure, treatment alternatives, risks and benefits explained, specific risks discussed. Consent was given by the patient. Immediately prior to procedure a time out was called to verify the correct patient, procedure, equipment, support staff and site/side marked as required. Patient was prepped and draped in the usual sterile fashion.  ? ? ?Plan: She shown quad strengthening exercises I had the demonstrate these back to me.  She also was given an extra hinged knee brace with open patella.  We will work on trying to gain approval for supplemental injection right knee.  She tolerated cortisone injection well  today.  We will see her back once the supplemental injection has been approved.  Questions were encouraged and answered.Norco was refilled.  ? ? ?

## 2021-05-23 ENCOUNTER — Telehealth: Payer: Self-pay | Admitting: Family Medicine

## 2021-05-23 ENCOUNTER — Telehealth: Payer: Self-pay

## 2021-05-23 NOTE — Telephone Encounter (Signed)
Please get auth for right knee gel injection 

## 2021-05-23 NOTE — Telephone Encounter (Signed)
Will route to Lori Orozco to f/u on what needs to be sent for referral  ?

## 2021-05-23 NOTE — Telephone Encounter (Signed)
The patient needs to contact Dr Anselm Jungling office (old GI) and have them send over records to [facility] ? ?The notes are in the referral by [facility], they are needing Dr Anselm Jungling notes to schedule patient.  ?

## 2021-05-23 NOTE — Telephone Encounter (Signed)
Pt called stating that the Gastroenterology that she was referred to states that they need the notes from the hospital from Dr Glori Bickers to determine if she needs to be seen. Please advise. ?

## 2021-05-24 NOTE — Telephone Encounter (Signed)
Pt notified of Ashtyn's comments. Pt will call Dr. Anselm Jungling office and get records sent  ?

## 2021-05-25 ENCOUNTER — Telehealth (INDEPENDENT_AMBULATORY_CARE_PROVIDER_SITE_OTHER): Payer: Medicare Other | Admitting: Obstetrics and Gynecology

## 2021-05-25 ENCOUNTER — Encounter: Payer: Self-pay | Admitting: Obstetrics and Gynecology

## 2021-05-25 DIAGNOSIS — N959 Unspecified menopausal and perimenopausal disorder: Secondary | ICD-10-CM | POA: Diagnosis not present

## 2021-05-25 MED ORDER — BLACK COHOSH 40 MG PO CAPS: 1.0000 | ORAL_CAPSULE | Freq: Every day | ORAL | 11 refills | Status: AC

## 2021-05-25 MED ORDER — ESTROVEN COMPLETE 4 MG PO TABS: 4.0000 mg | ORAL_TABLET | Freq: Every day | ORAL | 11 refills | Status: AC

## 2021-05-25 NOTE — Progress Notes (Signed)
? ? ?GYNECOLOGY VIRTUAL VISIT ENCOUNTER NOTE ? ?Provider location: [facility] [place]  ? ?Patient location: Home ? ?I connected with [name] on 05/25/21 at  1:00 PM EDT by MyChart Video Encounter and verified that I am speaking with the correct person using two identifiers. ?  ?I discussed the limitations, risks, security and privacy concerns of performing an evaluation and management service virtually and the availability of in person appointments. I also discussed with the patient that there may be a patient responsible charge related to this service. The patient expressed understanding and agreed to proceed. ?  ?History:  ?Lori Orozco is a 71 y.o. G0P0000 female being evaluated today for postmenopausal symptoms. Patient diagnosed with a DVT in October 2022 and was hospitalized until January 2023. Patient states that she is working with a physical therapist and is learning to walk again. She desires to discuss alternative options to premarin which she has been taking to manage her vasomotor symptoms for years. Patient reports significant hot flashes, mood swings, and insomnia. She understands that she is no longer a candidate for hormone replacement therapy. She denies any abnormal vaginal discharge, bleeding, pelvic pain or other concerns.   ?  ?  ?Past Medical History:  ?Diagnosis Date  ? Amaurosis fugax   ? Anemia   ? hx  ? Anxiety   ? Arthritis   ? Asthma   ? Cataract   ? Chronic fatigue syndrome   ? Chronic kidney disease   ? frequency, nephritis when 71 yrs old  ? COPD (chronic obstructive pulmonary disease) (HCC)   ? COVID-19   ? Depression   ? Diverticulitis   ? Emphysema of lung (HCC)   ? Fibromyalgia   ? GERD (gastroesophageal reflux disease)   ? occ  ? H/O hiatal hernia   ? History of bronchitis   ? Hyperlipidemia   ? Hypothyroidism   ? Interstitial cystitis   ? Irritable bowel syndrome   ? Lichen sclerosus   ? Lumbar herniated disc   ? Migraines   ?  Pneumonia   ? PONV (postoperative nausea and vomiting)   ? Shortness of breath   ? exertion   ? Thyroid disease   ? Graves  ? Urinary frequency   ? Urinary tract infection   ? Vertigo   ? ?Past Surgical History:  ?Procedure Laterality Date  ? ABDOMINAL HYSTERECTOMY    ? bladder sugery     ? CAROTID DOPPLAR    ? COLONOSCOPY  05/26/2010  ? avms- otherwise nl , re check 10y  ? DEXA-OSTEOPENIA    ? DOPPLER ECHOCARDIOGRAPHY    ? elbow surgery    ? EPICONDYLITIS    ? EYE SURGERY Bilateral   ? cataracts  ? FOOT SURGERY Bilateral   ? I & D EXTREMITY Right 09/01/2020  ? Procedure: TYNOSYNOVECTOMY;  Surgeon: [name], MD;  Location: [facility] OR;  Service: Orthopedics;  Laterality: Right;  ? KNEE SURGERY    ? Left cartilage  ? lipoma in second finger right hand    ? PLANTAR FASCIA SURGERY Left   ? REVERSE SHOULDER ARTHROPLASTY Left 02/02/2020  ? Procedure: LEFT REVERSE SHOULDER ARTHROPLASTY;  Surgeon: [name], MD;  Location: [facility] OR;  Service: Orthopedics;  Laterality: Left;  ? ROTATOR CUFF REPAIR Left 12/2017  ? TEMPOROMANDIBULAR JOINT SURGERY    ? TONSILLECTOMY    ? TOTAL HIP ARTHROPLASTY Right 03/20/2013  ? Procedure: Right TOTAL HIP ARTHROPLASTY;  Surgeon:  [name], MD;  Location: [facility] OR;  Service: Orthopedics;  Laterality: Right;  Right Total Hip Arthroplasty  ? TOTAL KNEE ARTHROPLASTY Left 01/28/2015  ? Procedure: LEFT TOTAL KNEE ARTHROPLASTY;  Surgeon: [name], MD;  Location: [facility] ORS;  Service: Orthopedics;  Laterality: Left;  ? TRIGGER FINGER RELEASE Right   ? TROCHANTERIC BURSA EXCISION Right 05/03/2016  ? Dr. [name], [name]  ? WRIST ARTHROSCOPY Right   ? ligament tear  ? ?The following portions of the patient's history were reviewed and updated as appropriate: allergies, current medications, past family history, past medical history, past social history, past surgical history and problem list.  ? ?Health Maintenance:  Normal mammogram on 10/2020.  ? ?Review of Systems:   ?Pertinent items noted in HPI and remainder of comprehensive ROS otherwise negative. ? ?Physical Exam:  ? ?General:  Alert, oriented and cooperative. Patient appears to be in no acute distress.  ?Mental Status: Normal mood and affect. Normal behavior. Normal judgment and thought content.   ?Respiratory: Normal respiratory effort, no problems with respiration noted  ?Rest of physical exam deferred due to type of encounter ? ?Labs and Imaging ?Results for orders placed or performed in visit on 05/16/21 (from the past 336 hour(s))  ?CBC with Differential/Platelet  ? Collection Time: 05/16/21  3:26 PM  ?Result Value Ref Range  ? WBC 14.2 (H) 4.0 - 10.5 K/uL  ? RBC 3.54 (L) 3.87 - 5.11 Mil/uL  ? Hemoglobin 10.5 (L) 12.0 - 15.0 g/dL  ? HCT 32.8 (L) 36.0 - 46.0 %  ? MCV 92.7 78.0 - 100.0 fl  ? MCHC 32.1 30.0 - 36.0 g/dL  ? RDW 16.8 (H) 11.5 - 15.5 %  ? Platelets 447.0 (H) 150.0 - 400.0 K/uL  ? Neutrophils Relative % 51.4 43.0 - 77.0 %  ? Lymphocytes Relative 38.7 12.0 - 46.0 %  ? Monocytes Relative 7.4 3.0 - 12.0 %  ? Eosinophils Relative 0.4 0.0 - 5.0 %  ? Basophils Relative 2.1 0.0 - 3.0 %  ? Neutro Abs 7.3 1.4 - 7.7 K/uL  ? Lymphs Abs 5.5 (H) 0.7 - 4.0 K/uL  ? Monocytes Absolute 1.1 (H) 0.1 - 1.0 K/uL  ? Eosinophils Absolute 0.1 0.0 - 0.7 K/uL  ? Basophils Absolute 0.3 (H) 0.0 - 0.1 K/uL  ?Comprehensive metabolic panel  ? Collection Time: 05/16/21  3:26 PM  ?Result Value Ref Range  ? Sodium 138 135 - 145 mEq/L  ? Potassium 4.9 3.5 - 5.1 mEq/L  ? Chloride 98 96 - 112 mEq/L  ? CO2 28 19 - 32 mEq/L  ? Glucose, Bld 90 70 - 99 mg/dL  ? BUN 29 (H) 6 - 23 mg/dL  ? Creatinine, Ser 1.13 0.40 - 1.20 mg/dL  ? Total Bilirubin 0.3 0.2 - 1.2 mg/dL  ? Alkaline Phosphatase 71 39 - 117 U/L  ? AST 29 0 - 37 U/L  ? ALT 46 (H) 0 - 35 U/L  ? Total Protein 6.9 6.0 - 8.3 g/dL  ? Albumin 3.6 3.5 - 5.2 g/dL  ? GFR 49.15 (L) >60.00 mL/min  ? Calcium 9.6 8.4 - 10.5 mg/dL  ?TSH  ? Collection Time: 05/16/21  3:26 PM  ?Result Value Ref Range  ? TSH  5.59 (H) 0.35 - 5.50 uIU/mL  ?Iron  ? Collection Time: 05/16/21  3:26 PM  ?Result Value Ref Range  ? Iron 43 42 - 145 ug/dL  ?Ferritin  ? Collection Time: 05/16/21  3:26 PM  ?Result Value Ref Range  ?  Ferritin 589.1 (H) 10.0 - 291.0 ng/mL  ? ?No results found.   ?  ?Assessment and Plan:  ?   ?1. Postmenopausal symptoms ?Discussed benefits of black cohosh and estroven ?Patient willing to try and understands that it may take 1-3 months before significant improvement in her symptoms are noted ? ?   ?  ?I discussed the assessment and treatment plan with the patient. The patient was provided an opportunity to ask questions and all were answered. The patient agreed with the plan and demonstrated an understanding of the instructions. ?  ?The patient was advised to call back or seek an in-person evaluation/go to the ED if the symptoms worsen or if the condition fails to improve as anticipated. ? ?I provided 15 minutes of face-to-face time during this encounter. ? ? ?Catalina AntiguaPeggy Keyontae Huckeby, MD ?[facility] ? ?

## 2021-05-26 DIAGNOSIS — E039 Hypothyroidism, unspecified: Secondary | ICD-10-CM

## 2021-05-26 DIAGNOSIS — I129 Hypertensive chronic kidney disease with stage 1 through stage 4 chronic kidney disease, or unspecified chronic kidney disease: Secondary | ICD-10-CM | POA: Diagnosis not present

## 2021-05-26 DIAGNOSIS — E7800 Pure hypercholesterolemia, unspecified: Secondary | ICD-10-CM | POA: Diagnosis not present

## 2021-05-26 DIAGNOSIS — J449 Chronic obstructive pulmonary disease, unspecified: Secondary | ICD-10-CM | POA: Diagnosis not present

## 2021-05-26 DIAGNOSIS — N1831 Chronic kidney disease, stage 3a: Secondary | ICD-10-CM

## 2021-05-29 NOTE — Telephone Encounter (Signed)
Noted  

## 2021-06-02 ENCOUNTER — Telehealth: Payer: Self-pay

## 2021-06-02 NOTE — Chronic Care Management (AMB) (Signed)
? ? ?Chronic Care Management ?Pharmacy Assistant  ? ?Name: Lori Orozco  MRN: [id number] DOB: 08/28/1950 ? ? ?Reason for Encounter:Hypertension  Disease State ?  ? ?Recent office visits:  ?05/16/21-PCP-Marne Tower,MD- hospital follow up-Breathing is pretty good - working on getting symbicort-labs ordered-(Anemia is improving and iron stores are high Hold the iron and re check cbc and ferritin in a month please TSH is mildly low ) started on Enbrel yesterday.Patient requires 50 mg of Myrbetriq for symptom control.When she needs a refill we will send it in at 50 instead of 25 which is more affordable than taking to 25mg . ? ? ?Recent consult visits:  ?05/25/21-OB/GYN-Peggy Constant,MD-Telemedicine-Postmenopausal symptons-discussed black cohosh and estroven  ?05/22/21-Orthopedic Surgery-Gil Clark,PA-Right knee OA-Cortisone injection  ? ?Hospital visits:  ?None in previous 6 months ? ?Medications: ?Outpatient Encounter Medications as of 06/02/2021  ?Medication Sig  ? albuterol (VENTOLIN HFA) 108 (90 Base) MCG/ACT inhaler Inhale 2 puffs into the lungs every 6 (six) hours as needed for wheezing or shortness of breath.  ? Black Cohosh 40 MG CAPS Take 1 capsule (40 mg total) by mouth daily.  ? budesonide-formoterol (SYMBICORT) 160-4.5 MCG/ACT inhaler Inhale 2 puffs into the lungs 2 (two) times daily.  ? buPROPion (WELLBUTRIN XL) 150 MG 24 hr tablet Take 150 mg by mouth daily.  ? busPIRone (BUSPAR) 15 MG tablet Take 15 mg by mouth 2 (two) times daily before a meal.  ? cholecalciferol (VITAMIN D) 25 MCG (1000 UNIT) tablet Take 2,000 Units by mouth daily. 2 tablets daily  ? clobetasol cream (TEMOVATE) 0.05 % APPLY SMALL AMOUNT TO AFFECTED AREAS TWO TO THREE TIMES PER WEEK  ? cyanocobalamin 2000 MCG tablet Take 1,000 mcg by mouth daily.  ? diclofenac Sodium (VOLTAREN) 1 % GEL Apply 2 g topically 2 (two) times daily as needed (pain).  ? diphenhydrAMINE (BENADRYL) 25 MG tablet Take 25 mg by mouth every 6 (six) hours as needed for  allergies.  ? divalproex (DEPAKOTE ER) 500 MG 24 hr tablet Take 500 mg by mouth at bedtime.   ? etanercept (ENBREL SURECLICK) 50 MG/ML injection Inject 50 mg into the skin once a week.  ? fluticasone (FLONASE) 50 MCG/ACT nasal spray PLACE 2 SPRAYS INTO THE NOSE DAILY. (Patient taking differently: Place 2 sprays into both nostrils. PLACE 2 SPRAYS INTO THE NOSE DAILY.)  ? HYDROcodone-acetaminophen (NORCO/VICODIN) 5-325 MG tablet Take 1 tablet by mouth 3 (three) times daily as needed for moderate pain.  ? ipratropium-albuterol (DUONEB) 0.5-2.5 (3) MG/3ML SOLN Take 3 mLs by nebulization every 6 (six) hours as needed.  ? iron polysaccharides (NIFEREX) 150 MG capsule Take 150 mg by mouth daily.  ? ketotifen (ZADITOR) 0.025 % ophthalmic solution Place 1 drop into both eyes 2 (two) times daily as needed (allergies).  ? levothyroxine (SYNTHROID) 50 MCG tablet TAKE ONE TABLET BY MOUTH DAILY BEFORE BREAKFAST (Patient taking differently: Take 50 mcg by mouth at bedtime.)  ? LORazepam (ATIVAN) 1 MG tablet Take 1 mg by mouth at bedtime as needed for anxiety or sleep.  ? metoprolol succinate (TOPROL-XL) 25 MG 24 hr tablet Take 0.5 tablets (12.5 mg total) by mouth in the morning and at bedtime.  ? mirabegron ER (MYRBETRIQ) 50 MG TB24 tablet Take 1 tablet (50 mg total) by mouth daily.  ? mirtazapine (REMERON) 30 MG tablet Take 30 mg by mouth at bedtime.   ? ondansetron (ZOFRAN-ODT) 4 MG disintegrating tablet Take 1 tablet (4 mg total) by mouth every 8 (eight) hours as needed for nausea  or vomiting.  ? predniSONE (DELTASONE) 20 MG tablet Take 1 tablet (20 mg total) by mouth daily with breakfast.  ? Rhubarb (ESTROVEN COMPLETE) 4 MG TABS Take 4 mg by mouth daily.  ? sertraline (ZOLOFT) 100 MG tablet Take 100 mg by mouth daily.   ? simvastatin (ZOCOR) 20 MG tablet TAKE ONE TABLET BY MOUTH EVERY NIGHT AT BEDTIME  ? ?No facility-administered encounter medications on file as of 06/02/2021.  ? ? ? ?Recent Office Vitals: ?BP Readings from Last  3 Encounters:  ?05/16/21 131/70  ?02/08/21 112/69  ?11/16/20 (!) 178/96  ? ?Pulse Readings from Last 3 Encounters:  ?05/16/21 88  ?02/08/21 (!) 118  ?11/16/20 (!) 109  ?  ?Wt Readings from Last 3 Encounters:  ?02/10/21 151 lb (68.5 kg)  ?11/16/20 192 lb (87.1 kg)  ?11/15/20 194 lb (88 kg)  ?  ? ?Kidney Function ?Lab Results  ?Component Value Date/Time  ? CREATININE 1.13 05/16/2021 03:26 PM  ? CREATININE 1.09 (H) 02/08/2021 04:19 PM  ? CREATININE 0.92 09/06/2020 02:46 AM  ? GFR 49.15 (L) 05/16/2021 03:26 PM  ? GFRNONAA >60 09/06/2020 02:46 AM  ? GFRAA >60 01/29/2015 05:10 AM  ? ? ? ?  Latest Ref Rng & Units 05/16/2021  ?  3:26 PM 02/08/2021  ?  4:19 PM 09/06/2020  ?  2:46 AM  ?BMP  ?Glucose 70 - 99 mg/dL 90   111   116    ?BUN 6 - 23 mg/dL 29   24   17     ?Creatinine 0.40 - 1.20 mg/dL 1.13   1.09   0.92    ?BUN/Creat Ratio 6 - 22 (calc)  22     ?Sodium 135 - 145 mEq/L 138   135   136    ?Potassium 3.5 - 5.1 mEq/L 4.9   4.4   3.9    ?Chloride 96 - 112 mEq/L 98   94   96    ?CO2 19 - 32 mEq/L 28   29   33    ?Calcium 8.4 - 10.5 mg/dL 9.6   9.2   9.4    ? ? ? ?Contacted patient on 06/06/21 to discuss hypertension disease state ? ?Current antihypertensive regimen:  ? Metoprolol succinate 25 mg - 1/2 tab BID  ? ? Patient verbally confirms she is taking the above medications as directed. Yes ? ?How often are you checking your Blood Pressure? infrequently  unaware that she needed to monitor more frequently, encouraged and discussed calling again in 4 weeks to obtain log after she starts medication increase of myrbetric ? ? ?Current home BP readings:  none available due to BP monitor reading error. The patient will research and purchase a new monitor.  ? ?Caffeine intake: ?Salt intake:Limits adding to all foods ?Over the counter medications including pseudoephedrine or NSAIDs? Tylenol,  ? ?Any readings above 180/120? No- Patient denies any hypertensive onsets ? ? ?What recent interventions/DTPs have been made by any provider to  improve Blood Pressure control since last CPP Visit: Patient to purchase new BP monitor and keep future log  ? ?Any recent hospitalizations or ED visits since last visit with CPP? Yes ? ?What diet changes have been made to improve Blood Pressure Control?  ? Patient reports staying hydrated with lots of water  ? ?What exercise is being done to improve your Blood Pressure Control?  ?  No activity energy,  did stand up by herself yesterday -painful joints all over, exercising in the bed ? ?  Adherence Review: ?Is the patient currently on ACE/ARB medication? No ?Does the patient have >5 day gap between last estimated fill dates? No ? ? ?Star Rating Drugs:  ?Medication:  Last Fill: Day Supply ?Simvastatin 20mg  05/14/21 90 ? ?Care Gaps: ?Annual wellness visit in last year? Yes ?Most Recent BP reading: 131/70  88-P 05/16/21 ? ?Upcoming appointments: ?PCP appointment on 07/11/21 and Pulmonology appointment with on 06/19/21   Rheumatology: 07/05/21 ? ?Charlene Brooke, CPP notified ? ?Yonas Bunda, CCMA ?Health concierge  ?[phone]  ?

## 2021-06-12 ENCOUNTER — Telehealth: Payer: Self-pay | Admitting: Gastroenterology

## 2021-06-12 NOTE — Telephone Encounter (Signed)
Request received to transfer GI care from outside practice to [facility].  We appreciate the interest in our practice, however at this time due to capacity and high demand from patients without established GI providers we cannot accommodate this transfer.  Ability to accommodate future transfer requests may change over time and the patient can contact us again in 6-12 months if still interested in being seen at [facility].      ?

## 2021-06-12 NOTE — Telephone Encounter (Signed)
Good afternoon Dr. Russella Dar, ? ?Patient called requesting an appointment with you.  She said you have been her doctor for several years.  However, she had an emergency in November 2022, had an EGD/colon at [facility], was taken by Ambulance (as she is bed-ridden).  The only reason she went there was because [place] Amb[place].  She now has a way to come to this office and wants to resume her care.  She has been asked to provide you with the procedure/pathology from the procedures done in November.  Please let me know if it's OK to schedule her with you. ? ?Thank you. ?

## 2021-06-16 DIAGNOSIS — J449 Chronic obstructive pulmonary disease, unspecified: Secondary | ICD-10-CM

## 2021-06-16 DIAGNOSIS — M069 Rheumatoid arthritis, unspecified: Secondary | ICD-10-CM | POA: Diagnosis not present

## 2021-06-16 DIAGNOSIS — I2699 Other pulmonary embolism without acute cor pulmonale: Secondary | ICD-10-CM | POA: Diagnosis not present

## 2021-06-16 DIAGNOSIS — R5382 Chronic fatigue, unspecified: Secondary | ICD-10-CM | POA: Diagnosis not present

## 2021-06-16 DIAGNOSIS — I2510 Atherosclerotic heart disease of native coronary artery without angina pectoris: Secondary | ICD-10-CM

## 2021-06-16 DIAGNOSIS — F419 Anxiety disorder, unspecified: Secondary | ICD-10-CM

## 2021-06-16 DIAGNOSIS — R911 Solitary pulmonary nodule: Secondary | ICD-10-CM

## 2021-06-16 DIAGNOSIS — Q2730 Arteriovenous malformation, site unspecified: Secondary | ICD-10-CM

## 2021-06-16 DIAGNOSIS — F17201 Nicotine dependence, unspecified, in remission: Secondary | ICD-10-CM

## 2021-06-16 DIAGNOSIS — E7800 Pure hypercholesterolemia, unspecified: Secondary | ICD-10-CM

## 2021-06-16 DIAGNOSIS — M1711 Unilateral primary osteoarthritis, right knee: Secondary | ICD-10-CM | POA: Diagnosis not present

## 2021-06-16 DIAGNOSIS — F32A Depression, unspecified: Secondary | ICD-10-CM

## 2021-06-17 NOTE — Progress Notes (Signed)
?Subjective:  ? ? Patient ID: Lori Orozco, female    DOB: 09-29-1950, 71 y.o.   MRN: [id number] ? ?HPI ? female former cigarette/ VAPE smoker followed for COPD, complicated by bipolar, GERD, Hiatal Hernia, Hypothyroid, Peripheral Neuropathy, Covid infection/MAB infusion Jan, 2021, DVT/PE, Rheumatoid Arthritis/ Enbrel,  ?PFT 10/09/10- ? ?----------------------------------------------------------------------------- ? ? ? ?06/16/20- 71 year old female former VAPE/ smoker followed for COPD,  Lung Nodules,, nicotine use, Allergic Rhinitis, complicated by Bipolar disorder, Hiatal Hernia, Hypothyroid, Peripheral Neuropathy, Covid infection/MAB infusion Jan, 2021,  ?-Flonase, Proair hfa, neb, Duoneb,  ?Covid vax- 3 Moderna, 1 Phizer ?Flu vax- had ?ACT score 12 ?Denies needing inhalers since she quit smoking. No DOE with ADLs and feels much better since recovery from Covid last year. No routine cough or phlegm and no active concerns. ? ?06/19/21- 71 year old female former VAPE/ smoker followed for COPD,  Lung Nodules,, nicotine use, Allergic Rhinitis, complicated by Bipolar disorder, Hiatal Hernia, Hypothyroid, Peripheral Neuropathy, Covid infection/MAB infusion Jan, 2021,  ?-Flonase, Proair hfa, neb, Duoneb, Symbicort 160, prednisone 20 qd,  ?Covid vax- 3 Moderna, 1 Phizer ?Flu vax- had ?-----Patient has been in the hospital at the end of the year with resp failure and covid. Just recently started walking independently last month. Patient feels like her breathing is doing good.  ?Long hospital stay last winter included DVT/ PE. Just started walking again in Feb. Caretaker with her today. No home O2.  ?Now on Enbrel and hoping that will allow her to taper off prednisone- managed by Rheumatology. ? ?ROS-see HPI   + = positive ?Constitutional:   No-   weight loss, night sweats, fevers, chills, fatigue, lassitude. ?HEENT:   No-  headaches, difficulty swallowing, tooth/dental problems, sore throat,  ?     No-  sneezing,  itching, ear ache, nasal congestion, post nasal drip,  ?CV:  No-   chest pain, orthopnea, PND, swelling in lower extremities, anasarca, dizziness, palpitations ?Resp: shortness of breath with exertion or at rest.   ?           No- productive cough,   non-productive cough,  No- coughing up of blood.   ?            No-   change in color of mucus.  No- wheezing.   ?Skin: No-   rash or lesions. ?GI:  No-   heartburn, indigestion, abdominal pain, nausea, vomiting,  ?GU: ?MS:  + joint pain or swelling.  Marland Kitchen ?Neuro-     nothing unusual ?Psych:  No- change in mood or affect. depression or +anxiety.  No memory loss.  ?Objective:  ? Physical Exam ? ?General- Alert, Oriented, Affect-appropriate, Distress- none acute.  +rolling walker ?Skin- rash-none, lesions- none, excoriation- none ?Lymphadenopathy- none ?Head- atraumatic ?           Eyes- Gross vision intact, PERRLA, conjunctivae clear secretions ?           Ears- Hearing, canals-normal ?           Nose- Clear, no-Septal dev, mucus, polyps, erosion, perforation  ?           Throat- Mallampati III-IV , mucosa clear , drainage- none, tonsils- atrophic ?Neck- flexible , trachea midline, no stridor , thyroid nl, carotid no bruit ?Chest - symmetrical excursion , unlabored ?          Heart/CV- RRR , no murmur , no gallop  , no rub, nl s1 s2 ?                          -  JVD- none , edema- none, stasis changes- none, varices- none ?          Lung- +clear/ diminished , unlabored, cough-none , dullness-none, rub- none ?          Chest wall-   ?Abd-  ?Br/ Gen/ Rectal- Not done, not indicated ?Extrem- +jiggling legs ?Neuro-  ? ? ? ? ? ? ?

## 2021-06-19 ENCOUNTER — Telehealth: Payer: Self-pay | Admitting: Physical Medicine and Rehabilitation

## 2021-06-19 ENCOUNTER — Other Ambulatory Visit: Payer: Self-pay | Admitting: Orthopaedic Surgery

## 2021-06-19 ENCOUNTER — Encounter: Payer: Self-pay | Admitting: Internal Medicine

## 2021-06-19 ENCOUNTER — Ambulatory Visit (INDEPENDENT_AMBULATORY_CARE_PROVIDER_SITE_OTHER): Payer: Medicare Other | Admitting: Internal Medicine

## 2021-06-19 ENCOUNTER — Telehealth: Payer: Self-pay | Admitting: Orthopaedic Surgery

## 2021-06-19 DIAGNOSIS — J449 Chronic obstructive pulmonary disease, unspecified: Secondary | ICD-10-CM | POA: Diagnosis not present

## 2021-06-19 DIAGNOSIS — R911 Solitary pulmonary nodule: Secondary | ICD-10-CM

## 2021-06-19 MED ORDER — HYDROCODONE-ACETAMINOPHEN 5-325 MG PO TABS: 1.0000 | ORAL_TABLET | Freq: Three times a day (TID) | ORAL | 0 refills | Status: DC | PRN

## 2021-06-19 NOTE — Assessment & Plan Note (Signed)
Imaging was done through prolonged hospital stay in [place] ?Plan- enter low dose screening chest CT program ?

## 2021-06-19 NOTE — Telephone Encounter (Signed)
Pt would like refill hydrocodone  ?

## 2021-06-19 NOTE — Assessment & Plan Note (Signed)
Remains off tobacco and feels her breathing is doing well. Not needing O2. ?Satisfied with her inhaled meds as discussed. ?Plan- continue current meds ?

## 2021-06-19 NOTE — Patient Instructions (Signed)
We can continue current breathing meds. ? ?Please call if we can help ?

## 2021-06-19 NOTE — Telephone Encounter (Signed)
Please advise 

## 2021-06-19 NOTE — Telephone Encounter (Signed)
Patient called. She would like an appointment with Dr. Ernestina Patches. Her call back number is [phone] ?

## 2021-06-22 ENCOUNTER — Telehealth: Payer: Self-pay | Admitting: Internal Medicine

## 2021-06-22 DIAGNOSIS — M0600 Rheumatoid arthritis without rheumatoid factor, unspecified site: Secondary | ICD-10-CM

## 2021-06-22 MED ORDER — PREDNISONE 20 MG PO TABS: 20.0000 mg | ORAL_TABLET | Freq: Every day | ORAL | 0 refills | Status: DC

## 2021-06-22 NOTE — Telephone Encounter (Signed)
She can stay on this at least until we see her again to repeat exam, new Rx sent for 1 month supply she has appointment in 2 weeks.

## 2021-06-22 NOTE — Telephone Encounter (Signed)
I called patient,  

## 2021-06-22 NOTE — Telephone Encounter (Signed)
Patient advised she can stay on this at least until we see her again to repeat exam, new Rx sent for 1 month supply she has appointment in 2 weeks. Patient expressed understanding.  ?

## 2021-06-22 NOTE — Telephone Encounter (Signed)
Patient called the office stating she is down to 2 tablets of prednisone and isnt sure if Dr. Dimple Caseyice wants her to refill it or wait until she is seen again. ?

## 2021-06-25 ENCOUNTER — Encounter: Payer: Self-pay | Admitting: Family Medicine

## 2021-06-26 MED ORDER — MIRABEGRON ER 50 MG PO TB24: 50.0000 mg | ORAL_TABLET | Freq: Every day | ORAL | 3 refills | Status: DC

## 2021-06-29 ENCOUNTER — Emergency Department (INDEPENDENT_AMBULATORY_CARE_PROVIDER_SITE_OTHER)
Admission: RE | Admit: 2021-06-29 | Discharge: 2021-06-29 | Disposition: A | Discharge status: Home / Self Care | Payer: Medicare Other | Source: Ambulatory Visit | Attending: Emergency Medicine | Admitting: Emergency Medicine

## 2021-06-29 ENCOUNTER — Telehealth: Payer: Self-pay | Admitting: Family Medicine

## 2021-06-29 ENCOUNTER — Emergency Department (INDEPENDENT_AMBULATORY_CARE_PROVIDER_SITE_OTHER): Payer: Medicare Other

## 2021-06-29 ENCOUNTER — Telehealth: Payer: Medicare Other | Admitting: Family Medicine

## 2021-06-29 VITALS — BP 149/74 | HR 70 | Temp 98.9°F | Resp 28 | Ht 67.0 in | Wt 170.0 lb

## 2021-06-29 DIAGNOSIS — J449 Chronic obstructive pulmonary disease, unspecified: Secondary | ICD-10-CM

## 2021-06-29 DIAGNOSIS — J441 Chronic obstructive pulmonary disease with (acute) exacerbation: Secondary | ICD-10-CM | POA: Diagnosis not present

## 2021-06-29 MED ORDER — IPRATROPIUM-ALBUTEROL 0.5-2.5 (3) MG/3ML IN SOLN
3.0000 mL | Freq: Once | RESPIRATORY_TRACT | Status: AC
  Administered 2021-06-29: 3 mL via RESPIRATORY_TRACT

## 2021-06-29 MED ORDER — ALBUTEROL SULFATE HFA 108 (90 BASE) MCG/ACT IN AERS: 1.0000 | INHALATION_SPRAY | RESPIRATORY_TRACT | 0 refills | Status: DC | PRN

## 2021-06-29 MED ORDER — CIPROFLOXACIN HCL 500 MG PO TABS: 500.0000 mg | ORAL_TABLET | Freq: Two times a day (BID) | ORAL | 0 refills | Status: DC

## 2021-06-29 MED ORDER — PREDNISONE 20 MG PO TABS
20.0000 mg | ORAL_TABLET | Freq: Once | ORAL | Status: AC
Start: 2021-06-29 — End: 2021-06-29
  Administered 2021-06-29: 20 mg via ORAL

## 2021-06-29 MED ORDER — AEROCHAMBER PLUS MISC: 2 refills | Status: AC

## 2021-06-29 MED ORDER — PREDNISONE 20 MG PO TABS: 20.0000 mg | ORAL_TABLET | Freq: Every day | ORAL | 0 refills | Status: AC

## 2021-06-29 NOTE — Discharge Instructions (Addendum)
2 puffs from your albuterol inhaler using your spacer every 4 hours for 2 days, then every 6 hours for 2 days, then as needed.  You can back off on the albuterol if you start to improve sooner.  I am increasing her steroids to 40 mg/day for the next 5 days.  Cipro unfortunately was her only option given your situation for COPD exacerbation.  Please finish it unless a healthcare provider tells you to stop.  Follow-up with your primary care provider in several days to make sure that you are getting better.  Go to the ER for oxygen saturation consistently below 90%, difficulty breathing, needing your albuterol inhaler more than every 4 hours, or for other concerns. ?

## 2021-06-29 NOTE — ED Triage Notes (Signed)
Pt presents to Urgent Care with c/o productive cough, nasal congestion, and body aches x approx 4 days. Also sob w/ exertion. Reports 2 negative home COVID tests.  ?

## 2021-06-29 NOTE — Progress Notes (Signed)
? ?Office Visit Note ? ?Patient: Lori Orozco             ?Date of Birth: [birth year]           ?MRN: [id number]             ?PCP: Abner Greenspan, MD ?Referring: Tower, Wynelle Fanny, MD ?Visit Date: 07/05/2021 ? ? ?Subjective:  ?Follow-up (Improved since staring Enbrel, redness at injection site) ? ? ?History of Present Illness: Lori Orozco is a 71 y.o. 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 She has seen a large improvement in joint inflammation since starting the Enbrel. She has pain and redness around the injection site lasting up to 3 days with each dose. Wrists continue to hurt and have decreased movement left wrist actually more symptomatic than right now. She is working with physical therapy on getting back to walking currently very weak and unstable due to illness and deconditioning. She had recent ED visit for COPD exacerbation finished antibiotics for this. No other recent infection issues. ? ?Previous HPI ?02/08/2021 ?Lori HOLCOMBis a 71y.o. 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  She has had major medical events since her last visit.  She developed increased knee pain and swelling on the right side.  However she went to the hospital due to developing more weakness and shortness of breath CT angiogram identified bilateral pulmonary emboli.  Treatment with anticoagulation led to significant gastrointestinal bleeding from previously unknown AVM.  This was treated also in the hospital but overall protracted complications lasted for about a month hospitalization.  During this time she experienced worsening joint pain and swelling and significant deconditioning and weakness.  She was discharged from the hospital to rehab facility she was not continued on any steroid or other anti-inflammatory treatment reports joint pain and  swelling affecting numerous joints in bilateral elbows wrists fingers and knees.  This is limited any significant progress with physical therapy so far.  She called back to clinic earlier this month with the symptoms and was restarted on a prednisone taper currently down to 15 mg daily.  She saw orthopedics clinic for right knee aspiration injection 2 days ago that is partially helping. ?  ?Previous HPI ?11/15/20 ?Lori VIRAMONTESis a 71y.o. female here for follow up with wrist wrist extensor tenosynovitis and presumed seronegative inflammatory arthritis also involving knee joints after starting methotrexate 15 mg PO weekly and tapering prednisone off. Her wrist is slightly improved but remains very painful and swollen with decreased mobility. After starting methotrexate she has noticed some episodes of dizziness and urinary retention and frequency. She stopped the prednisone without noticing much difference in symptoms so far.   ?  ?09/20/20 ?Lori FLAMMERis a 71y.o. female here for joint pain and swelling of knees and wrists with severe tenosynovitis s/p tenosynovectomy on 09/01/20 with Dr. BNinfa Linden  She had been feeling in her usual health until June she recalls onset of symptoms after using a gas leaf blower at her home during which time she experienced some mild right wrist pain.  She does not recall any particular injury event at this time.  However she experienced progressive ongoing increase in pain with swelling and stiffness in her right wrist especially with decreased range of motion and pain over the dorsal aspect.  This is evaluated  at orthopedics clinic with aspiration that revealed just coagulated sample trial of steroid medication and only partially improved symptoms then continued worsening.  She was admitted to the hospital for tenosynovectomy that was performed without complication.  Intraoperatively reported extensive inflammatory tissue debridement without any evidence concerning for  infection.  During this hospitalization she also developed knee effusion which was aspirated demonstrating inflammatory synovial fluid with negative microscopy and cultures.  Is now almost 3 weeks since her surgery she continues experiencing severe pain intensity swelling around the right hand and wrist and has developed some skin blistering. ?  ?  ?Labs reviewed ?08/2020 ?Synovial fluid knee 4,525 WBCs 90% neutrophils ?ANA neg ?RF 14.2 ?CCP neg ?ESR 37 ?CRP 14.1 ?Uric acid 4.9 ?  ?07/2020 ?Synovial fluid wrist clotted sample negative gram stain ? ? ?Review of Systems  ?Constitutional:  Positive for fatigue.  ?HENT:  Positive for mouth dryness.   ?Eyes:  Positive for dryness.  ?Respiratory:  Positive for shortness of breath.   ?Cardiovascular:  Positive for swelling in legs/feet.  ?Gastrointestinal:  Positive for constipation and diarrhea.  ?Endocrine: Positive for cold intolerance, heat intolerance, excessive thirst and increased urination.  ?Genitourinary:  Negative for difficulty urinating.  ?Musculoskeletal:  Positive for joint pain, gait problem, joint pain, joint swelling, muscle weakness, morning stiffness and muscle tenderness.  ?Skin:  Negative for rash.  ?Allergic/Immunologic: Negative for susceptible to infections.  ?Neurological:  Positive for weakness.  ?Hematological:  Positive for bruising/bleeding tendency.  ?Psychiatric/Behavioral:  Positive for sleep disturbance.   ? ?PMFS History:  ?Patient Active Problem List  ? Diagnosis Date Noted  ? 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 History of pulmonary embolism 12/08/2020  ? Arthritis of knee 12/06/2020  ? Bilateral knee pain 09/20/2020  ? Seronegative rheumatoid arthritis ([place]) 09/20/2020  ? High risk medication use 09/20/2020  ? Extensor tenosynovitis of right wrist 09/15/2020  ? Wrist pain 08/30/2020  ? Pain and swelling of right wrist 08/28/2020  ? S/P reverse total shoulder arthroplasty, 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  ? Lung nodule 01/02/2020  ? COVID-19  03/31/2019  ? Status post arthroscopy of left shoulder 01/16/2018  ? Estrogen deficiency 06/28/2017  ? Medial epicondylitis, left elbow 04/29/2017  ? S/P arthroscopy of left shoulder 02/25/2017  ? Impingement syndrome of left shoulder 01/30/2017  ? Pedal edema 10/26/2016  ? Trochanteric bursitis, right hip 04/10/2016  ? Essential hypertension 09/02/2015  ? Urge incontinence 07/27/2015  ? Obesity 04/28/2015  ? Need for hepatitis C screening test 04/26/2015  ? Screening for HIV (human immunodeficiency virus) 04/26/2015  ? Osteoarthritis of left knee 01/28/2015  ? Status post total left knee replacement 01/28/2015  ? Vitamin D deficiency 04/27/2014  ? Seasonal and perennial allergic rhinitis 06/17/2013  ? S/P total hip arthroplasty 03/20/2013  ? Medicare annual wellness visit, subsequent 11/18/2012  ? History of falling 11/18/2012  ? Routine general medical examination at a health care facility 11/10/2012  ? Lichen sclerosus et atrophicus of the vulva 07/29/2012  ? Osteopenia 10/19/2011  ? Other screening mammogram 10/19/2011  ? Hypothyroidism 10/19/2011  ? ANEMIA 10/11/2009  ? PERIPHERAL NEUROPATHY, FEET 08/05/2009  ? HAND PAIN, BILATERAL 08/05/2009  ? TREMOR 03/09/2008  ? MENOPAUSAL SYNDROME 10/09/2007  ? DEPRESSION, MAJOR 05/20/2007  ? BIPOLAR AFFECTIVE DISORDER 05/20/2007  ? Generalized anxiety disorder 05/20/2007  ? PERSONALITY DISORDER 05/20/2007  ? ESOPHAGEAL SPASM 05/20/2007  ? Diaphragmatic hernia 05/20/2007  ? AMAUROSIS FUGAX 05/07/2007  ? COPD mixed type ([place]) 03/27/2007  ? HYPERCHOLESTEROLEMIA, PURE 12/10/2006  ? SYMPTOM,  SYNDROME, CHRONIC FATIGUE 12/10/2006  ?  ?Past Medical History:  ?Diagnosis Date  ? Amaurosis fugax   ? Anemia   ? hx  ? Anxiety   ? Arthritis   ? Asthma   ? Cataract   ? Chronic fatigue syndrome   ? Chronic kidney disease   ? frequency, nephritis when 71 yrs old  ? COPD (chronic obstructive pulmonary disease) ([place])   ? COVID-19   ? Depression   ? Diverticulitis   ? Emphysema of lung ([employer])    ? Fibromyalgia   ? GERD (gastroesophageal reflux disease)   ? occ  ? H/O hiatal hernia   ? History of bronchitis   ? Hyperlipidemia   ? Hypothyroidism   ? Interstitial cystitis   ? Irritable bowel syndro

## 2021-06-29 NOTE — Telephone Encounter (Signed)
Pt called @ 8:32 today stated she can not come in office but would like dr. Milinda Antis to give her a call because she has chills,fever ,body aches but don't have covid ?

## 2021-06-29 NOTE — Telephone Encounter (Signed)
Please triage pt, no appt is scheduled with PCP ?

## 2021-06-29 NOTE — Telephone Encounter (Signed)
Agree with the appt in [place] and ER precautions  ?Will watch for correspondence  ?

## 2021-06-29 NOTE — ED Notes (Signed)
Dr. Chaney MallingMortenson made aware of pt's O2 sat of 88% on room air w/ RR of 28. New orders for O2 @ 2 LPM via nasal cannula.  ?

## 2021-06-29 NOTE — ED Provider Notes (Signed)
HPI ? ?SUBJECTIVE: ? ?Lori Orozco is a 71 y.o. female who presents with 4 days of cough.  She reports nasal congestion, clear rhinorrhea, and the cough becoming productive of dark brown-yellow material starting 3 days ago.  She reports body aches, chills, generalized weakness, shortness of breath with exertion to a few steps.  She denies fevers.  No headache, sinus pain or pressure, loss sense of smell or taste, nausea, vomiting, diarrhea, abdominal pain.  She has had 2 negative home COVID tests.  No wheezing, calf pain, swelling, hemoptysis, surgery in the past 4 weeks.  No lower extremity edema, nocturia, PND, orthopnea, abdominal pain.  She reports a 20 pound weight gain in the past 3 weeks, but she is also on 20 mg of prednisone daily since August 22.  States that she "cannot stop eating".  No known COVID or flu exposure.  She got 4 doses of the COVID-vaccine.  She did not get this years flu vaccine.  She has been using her Symbicort as directed, and her albuterol inhaler twice with improvement in her symptoms.  She does not have a spacer at home.  She has also tried Tylenol.  Symptoms are worse with exertion.  No antibiotics in the past 3 months.  Last dose of Tylenol was within 6 hours of evaluation. Patient has a past medical history of COPD, COVID, PE/DVT from recent hospitalization, not on any anticoagulants due to GI bleed, rheumatoid arthritis on Enbrel and 20 mg of prednisone daily, COVID x2, last bout in December 22 where she was admitted with respiratory failure, GI bleed, hypertension.  No history of diabetes, chronic kidney disease, smoking, CHF.  PCP: Dr. Vinnie Langton pulmonology: Irving Copas ? ?Past Medical History:  ?Diagnosis Date  ? Amaurosis fugax   ? Anemia   ? hx  ? Anxiety   ? Arthritis   ? Asthma   ? Cataract   ? Chronic fatigue syndrome   ? Chronic kidney disease   ? frequency, nephritis when 71 yrs old  ? COPD (chronic obstructive pulmonary disease) (HCC)   ? COVID-19   ?  Depression   ? Diverticulitis   ? Emphysema of lung (HCC)   ? Fibromyalgia   ? GERD (gastroesophageal reflux disease)   ? occ  ? H/O hiatal hernia   ? History of bronchitis   ? Hyperlipidemia   ? Hypothyroidism   ? Interstitial cystitis   ? Irritable bowel syndrome   ? Lichen sclerosus   ? Lumbar herniated disc   ? Migraines   ? Pneumonia   ? PONV (postoperative nausea and vomiting)   ? Shortness of breath   ? exertion   ? Thyroid disease   ? Graves  ? Urinary frequency   ? Urinary tract infection   ? Vertigo   ? ? ?Past Surgical History:  ?Procedure Laterality Date  ? ABDOMINAL HYSTERECTOMY    ? bladder sugery     ? CAROTID DOPPLAR    ? COLONOSCOPY  05/26/2010  ? avms- otherwise nl , re check 10y  ? DEXA-OSTEOPENIA    ? DOPPLER ECHOCARDIOGRAPHY    ? elbow surgery    ? EPICONDYLITIS    ? EYE SURGERY Bilateral   ? cataracts  ? FOOT SURGERY Bilateral   ? I & D EXTREMITY Right 09/01/2020  ? Procedure: TYNOSYNOVECTOMY;  Surgeon: [name], MD;  Location: [facility] OR;  Service: Orthopedics;  Laterality: Right;  ? KNEE SURGERY    ? Left cartilage  ?  lipoma in second finger right hand    ? PLANTAR FASCIA SURGERY Left   ? REVERSE SHOULDER ARTHROPLASTY Left 02/02/2020  ? Procedure: LEFT REVERSE SHOULDER ARTHROPLASTY;  Surgeon: [name], MD;  Location: [facility] OR;  Service: Orthopedics;  Laterality: Left;  ? ROTATOR CUFF REPAIR Left 12/2017  ? TEMPOROMANDIBULAR JOINT SURGERY    ? TONSILLECTOMY    ? TOTAL HIP ARTHROPLASTY Right 03/20/2013  ? Procedure: Right TOTAL HIP ARTHROPLASTY;  Surgeon: [name], MD;  Location: [facility] OR;  Service: Orthopedics;  Laterality: Right;  Right Total Hip Arthroplasty  ? TOTAL KNEE ARTHROPLASTY Left 01/28/2015  ? Procedure: LEFT TOTAL KNEE ARTHROPLASTY;  Surgeon: [name], MD;  Location: [facility] ORS;  Service: Orthopedics;  Laterality: Left;  ? TRIGGER FINGER RELEASE Right   ? TROCHANTERIC BURSA EXCISION Right 05/03/2016  ? Dr. [name], [name]  ? WRIST ARTHROSCOPY Right    ? ligament tear  ? ? ?Family History  ?Problem Relation Age of Onset  ? Arthritis Mother   ? Hypertension Mother   ? Kidney disease Mother   ? Cancer Father   ?     Bladder  ? Arthritis Sister   ? Colon cancer Other   ? Cancer - Lung Cousin   ? Arthritis Maternal Grandmother   ? ? ?Social History  ? ?Tobacco Use  ? Smoking status: Former  ?  Packs/day: 2.00  ?  Years: 40.00  ?  Pack years: 80.00  ?  Types: E-cigarettes, Cigarettes  ?  Quit date: 2015  ?  Years since quitting: 8.3  ? Smokeless tobacco: Never  ?Vaping Use  ? Vaping Use: Former  ? Quit date: 11/26/2020  ? Devices: no nicotine in it  ?Substance Use Topics  ? Alcohol use: No  ?  Alcohol/week: 0.0 standard drinks  ? Drug use: No  ? ? ?No current facility-administered medications for this encounter. ? ?Current Outpatient Medications:  ?  albuterol (VENTOLIN HFA) 108 (90 Base) MCG/ACT inhaler, Inhale 1-2 puffs into the lungs every 4 (four) hours as needed for wheezing or shortness of breath., Disp: 1 each, Rfl: 0 ?  ciprofloxacin (CIPRO) 500 MG tablet, Take 1 tablet (500 mg total) by mouth 2 (two) times daily., Disp: 14 tablet, Rfl: 0 ?  predniSONE (DELTASONE) 20 MG tablet, Take 1 tablet (20 mg total) by mouth daily with breakfast for 5 days., Disp: 5 tablet, Rfl: 0 ?  Spacer/Aero-Holding Chambers (AEROCHAMBER PLUS) inhaler, Use with inhaler, Disp: 1 each, Rfl: 2 ?  Black Cohosh 40 MG CAPS, Take 1 capsule (40 mg total) by mouth daily., Disp: 30 capsule, Rfl: 11 ?  budesonide-formoterol (SYMBICORT) 160-4.5 MCG/ACT inhaler, Inhale 2 puffs into the lungs 2 (two) times daily., Disp: 3 each, Rfl: 3 ?  buPROPion (WELLBUTRIN XL) 150 MG 24 hr tablet, Take 150 mg by mouth daily., Disp: , Rfl:  ?  busPIRone (BUSPAR) 15 MG tablet, Take 15 mg by mouth 2 (two) times daily before a meal., Disp: , Rfl:  ?  cholecalciferol (VITAMIN D) 25 MCG (1000 UNIT) tablet, Take 2,000 Units by mouth daily. 2 tablets daily, Disp: , Rfl:  ?  clobetasol cream (TEMOVATE) 0.05 %, APPLY  SMALL AMOUNT TO AFFECTED AREAS TWO TO THREE TIMES PER WEEK, Disp: 30 g, Rfl: 4 ?  cyanocobalamin 2000 MCG tablet, Take 1,000 mcg by mouth daily., Disp: , Rfl:  ?  diclofenac Sodium (VOLTAREN) 1 % GEL, Apply 2 g topically 2 (two) times daily as needed (pain)., Disp: ,  Rfl:  ?  diphenhydrAMINE (BENADRYL) 25 MG tablet, Take 25 mg by mouth every 6 (six) hours as needed for allergies., Disp: , Rfl:  ?  divalproex (DEPAKOTE ER) 500 MG 24 hr tablet, Take 500 mg by mouth at bedtime. , Disp: , Rfl:  ?  etanercept (ENBREL SURECLICK) 50 MG/ML injection, Inject 50 mg into the skin once a week., Disp: 12 mL, Rfl: 0 ?  fluticasone (FLONASE) 50 MCG/ACT nasal spray, PLACE 2 SPRAYS INTO THE NOSE DAILY. (Patient taking differently: Place 2 sprays into both nostrils. PLACE 2 SPRAYS INTO THE NOSE DAILY.), Disp: 16 g, Rfl: 11 ?  HYDROcodone-acetaminophen (NORCO/VICODIN) 5-325 MG tablet, Take 1 tablet by mouth 3 (three) times daily as needed for moderate pain., Disp: 30 tablet, Rfl: 0 ?  ipratropium-albuterol (DUONEB) 0.5-2.5 (3) MG/3ML SOLN, Take 3 mLs by nebulization every 6 (six) hours as needed., Disp: 120 mL, Rfl: prn ?  iron polysaccharides (NIFEREX) 150 MG capsule, Take 150 mg by mouth daily., Disp: , Rfl:  ?  ketotifen (ZADITOR) 0.025 % ophthalmic solution, Place 1 drop into both eyes 2 (two) times daily as needed (allergies)., Disp: , Rfl:  ?  levothyroxine (SYNTHROID) 50 MCG tablet, TAKE ONE TABLET BY MOUTH DAILY BEFORE BREAKFAST (Patient taking differently: Take 50 mcg by mouth at bedtime.), Disp: 90 tablet, Rfl: 3 ?  LORazepam (ATIVAN) 1 MG tablet, Take 1 mg by mouth at bedtime as needed for anxiety or sleep., Disp: , Rfl:  ?  metoprolol succinate (TOPROL-XL) 25 MG 24 hr tablet, Take 0.5 tablets (12.5 mg total) by mouth in the morning and at bedtime., Disp: 90 tablet, Rfl: 3 ?  mirabegron ER (MYRBETRIQ) 50 MG TB24 tablet, Take 1 tablet (50 mg total) by mouth daily., Disp: 190 tablet, Rfl: 3 ?  mirtazapine (REMERON) 30 MG  tablet, Take 30 mg by mouth at bedtime. , Disp: , Rfl:  ?  ondansetron (ZOFRAN-ODT) 4 MG disintegrating tablet, Take 1 tablet (4 mg total) by mouth every 8 (eight) hours as needed for nausea or vomiting., Disp

## 2021-06-29 NOTE — Telephone Encounter (Signed)
I spoke with pt; symptoms started 4 days ago with fever 99, chills, body aches, prod cough with brownish yellow phlegm, SOB upon exertion and sometimes sitting. Pt said symbicort and rescue inhaler does help.Pt also has scratchy S/T. Pt has checked 2 covid test(both neg) and last covid test was 06/28/21. No H/A or CP.pt lives in [place] and does not feel like traveling to office to be seen; pt scheduled VV with Dr Glori Bickers today at 11 AM but pt needs in person visit for eval and listen to lungs with possible testing (CXR). Cancelled VV and I scheduled pt appt at [facility] 06/29/21 at 2 PM (pt said would take that long for pt to get ready and go to UC). UC & ED precautions given and pt voiced understanding. Pt was SOB while on phone but declined going to ED; pt said she is not in distress now with breathing. Sending note to Dr Glori Bickers and I spoke with [place] CMA and she will advise Dr Glori Bickers of above info.  ?

## 2021-07-03 ENCOUNTER — Telehealth: Payer: Self-pay

## 2021-07-03 NOTE — Chronic Care Management (AMB) (Signed)
Chronic Care Management Pharmacy Assistant   Name: Lori Orozco  MRN: [id number] DOB: Aug 19, 1950   Reason for Encounter:Hypertension  Disease State    Recent office visits:  None since last CCM contact  Recent consult visits:  06/19/21-Clinton Young,MD(pulmonology)f/u COPD-no medication changes 06/29/21-Ashley Mortenson,MD([facility])-cough,congestion,start DuoNEb, 20mg  prednisone,chest xray-discharged to home with prednisone 40mg  a day for 5 days. Cipro 500mg  twice daily for 7 days,albuterol inhaler with spacer.  Hospital visits:  None in previous 6 months  Medications: Outpatient Encounter Medications as of 07/03/2021  Medication Sig   albuterol (VENTOLIN HFA) 108 (90 Base) MCG/ACT inhaler Inhale 1-2 puffs into the lungs every 4 (four) hours as needed for wheezing or shortness of breath.   Black Cohosh 40 MG CAPS Take 1 capsule (40 mg total) by mouth daily.   budesonide-formoterol (SYMBICORT) 160-4.5 MCG/ACT inhaler Inhale 2 puffs into the lungs 2 (two) times daily.   buPROPion (WELLBUTRIN XL) 150 MG 24 hr tablet Take 150 mg by mouth daily.   busPIRone (BUSPAR) 15 MG tablet Take 15 mg by mouth 2 (two) times daily before a meal.   cholecalciferol (VITAMIN D) 25 MCG (1000 UNIT) tablet Take 2,000 Units by mouth daily. 2 tablets daily   ciprofloxacin (CIPRO) 500 MG tablet Take 1 tablet (500 mg total) by mouth 2 (two) times daily.   clobetasol cream (TEMOVATE) 0.05 % APPLY SMALL AMOUNT TO AFFECTED AREAS TWO TO THREE TIMES PER WEEK   cyanocobalamin 2000 MCG tablet Take 1,000 mcg by mouth daily.   diclofenac Sodium (VOLTAREN) 1 % GEL Apply 2 g topically 2 (two) times daily as needed (pain).   diphenhydrAMINE (BENADRYL) 25 MG tablet Take 25 mg by mouth every 6 (six) hours as needed for allergies.   divalproex (DEPAKOTE ER) 500 MG 24 hr tablet Take 500 mg by mouth at bedtime.    etanercept (ENBREL SURECLICK) 50 MG/ML injection Inject 50 mg into the skin once a week.    fluticasone (FLONASE) 50 MCG/ACT nasal spray PLACE 2 SPRAYS INTO THE NOSE DAILY. (Patient taking differently: Place 2 sprays into both nostrils. PLACE 2 SPRAYS INTO THE NOSE DAILY.)   HYDROcodone-acetaminophen (NORCO/VICODIN) 5-325 MG tablet Take 1 tablet by mouth 3 (three) times daily as needed for moderate pain.   ipratropium-albuterol (DUONEB) 0.5-2.5 (3) MG/3ML SOLN Take 3 mLs by nebulization every 6 (six) hours as needed.   iron polysaccharides (NIFEREX) 150 MG capsule Take 150 mg by mouth daily.   ketotifen (ZADITOR) 0.025 % ophthalmic solution Place 1 drop into both eyes 2 (two) times daily as needed (allergies).   levothyroxine (SYNTHROID) 50 MCG tablet TAKE ONE TABLET BY MOUTH DAILY BEFORE BREAKFAST (Patient taking differently: Take 50 mcg by mouth at bedtime.)   LORazepam (ATIVAN) 1 MG tablet Take 1 mg by mouth at bedtime as needed for anxiety or sleep.   metoprolol succinate (TOPROL-XL) 25 MG 24 hr tablet Take 0.5 tablets (12.5 mg total) by mouth in the morning and at bedtime.   mirabegron ER (MYRBETRIQ) 50 MG TB24 tablet Take 1 tablet (50 mg total) by mouth daily.   mirtazapine (REMERON) 30 MG tablet Take 30 mg by mouth at bedtime.    ondansetron (ZOFRAN-ODT) 4 MG disintegrating tablet Take 1 tablet (4 mg total) by mouth every 8 (eight) hours as needed for nausea or vomiting.   predniSONE (DELTASONE) 20 MG tablet Take 1 tablet (20 mg total) by mouth daily with breakfast.   predniSONE (DELTASONE) 20 MG tablet Take 1 tablet (20 mg  total) by mouth daily with breakfast for 5 days.   Rhubarb (ESTROVEN COMPLETE) 4 MG TABS Take 4 mg by mouth daily.   sertraline (ZOLOFT) 100 MG tablet Take 100 mg by mouth daily.    simvastatin (ZOCOR) 20 MG tablet TAKE ONE TABLET BY MOUTH EVERY NIGHT AT BEDTIME   Spacer/Aero-Holding Chambers (AEROCHAMBER PLUS) inhaler Use with inhaler   No facility-administered encounter medications on file as of 07/03/2021.     Recent Office Vitals: BP Readings from Last 3  Encounters:  06/29/21 (!) 149/74  06/19/21 140/70  05/16/21 131/70   Pulse Readings from Last 3 Encounters:  06/29/21 70  06/19/21 69  05/16/21 88    Wt Readings from Last 3 Encounters:  06/29/21 170 lb (77.1 kg)  06/19/21 182 lb 9.6 oz (82.8 kg)  02/10/21 151 lb (68.5 kg)     Kidney Function Lab Results  Component Value Date/Time   CREATININE 1.13 05/16/2021 03:26 PM   CREATININE 1.09 (H) 02/08/2021 04:19 PM   CREATININE 0.92 09/06/2020 02:46 AM   GFR 49.15 (L) 05/16/2021 03:26 PM   GFRNONAA >60 09/06/2020 02:46 AM   GFRAA >60 01/29/2015 05:10 AM       Latest Ref Rng & Units 05/16/2021    3:26 PM 02/08/2021    4:19 PM 09/06/2020    2:46 AM  BMP  Glucose 70 - 99 mg/dL 90   111   116    BUN 6 - 23 mg/dL 29   24   17     Creatinine 0.40 - 1.20 mg/dL 1.13   1.09   0.92    BUN/Creat Ratio 6 - 22 (calc)  22     Sodium 135 - 145 mEq/L 138   135   136    Potassium 3.5 - 5.1 mEq/L 4.9   4.4   3.9    Chloride 96 - 112 mEq/L 98   94   96    CO2 19 - 32 mEq/L 28   29   33    Calcium 8.4 - 10.5 mg/dL 9.6   9.2   9.4       Contacted patient on 07/03/21 to discuss hypertension disease state  Current antihypertensive regimen:   Metoprolol succinate 25 mg - 1/2 tab BID    Patient verbally confirms she is taking the above medications as directed. Yes  How often are you checking your Blood Pressure? daily  she checks her blood pressure in the morning before taking her medication.  Current home BP readings:         BP               PULSE       147/73       140/69       146/66       162/83       136/77       142/70  Wrist or arm cuff: arm cuff Caffeine intake:2-3 cups coffee daily  Salt intake: limits adding to cooked meals  Over the counter medications including pseudoephedrine or NSAIDs? Tylenol prn   Any readings above 180/120? No  What recent interventions/DTPs have been made by any provider to improve Blood Pressure control since last CPP Visit: Patient purchased a  new BP monitor and cuff   Any recent hospitalizations or ED visits since last visit with CPP? Yes-06/29/21  COPD flare up urgent care.  What diet changes have been made to improve Blood Pressure Control?   Patient  reports she continues to hydrate with lots of water  What exercise is being done to improve your Blood Pressure Control?   Patient reports she will stand up daily and take a few steps, her legs are unsteady   Adherence Review: Is the patient currently on ACE/ARB medication? No Does the patient have >5 day gap between last estimated fill dates? No   Star Rating Drugs:  Medication:  Last Fill: Day Supply Simvastatin 20mg  05/14/21 90    Care Gaps: Annual wellness visit in last year? Yes Most Recent BP reading:140/70  69-P  06/19/21  Upcoming appointments: PCP appointment on 07/11/21 and CCM appointment on 11/15/21   Charlene Brooke, CPP notified  Avel Sensor, [place]  [phone]

## 2021-07-05 ENCOUNTER — Ambulatory Visit (INDEPENDENT_AMBULATORY_CARE_PROVIDER_SITE_OTHER): Payer: Medicare Other | Admitting: Internal Medicine

## 2021-07-05 ENCOUNTER — Encounter: Payer: Self-pay | Admitting: Internal Medicine

## 2021-07-05 VITALS — BP 151/77 | HR 89 | Resp 22 | Ht 67.0 in | Wt 187.0 lb

## 2021-07-05 DIAGNOSIS — M25561 Pain in right knee: Secondary | ICD-10-CM | POA: Diagnosis not present

## 2021-07-05 DIAGNOSIS — Z79899 Other long term (current) drug therapy: Secondary | ICD-10-CM | POA: Diagnosis not present

## 2021-07-05 DIAGNOSIS — M0600 Rheumatoid arthritis without rheumatoid factor, unspecified site: Secondary | ICD-10-CM

## 2021-07-05 DIAGNOSIS — M25562 Pain in left knee: Secondary | ICD-10-CM | POA: Diagnosis not present

## 2021-07-05 DIAGNOSIS — G8929 Other chronic pain: Secondary | ICD-10-CM

## 2021-07-05 MED ORDER — PREDNISONE 10 MG PO TABS: ORAL_TABLET | ORAL | 1 refills | Status: DC

## 2021-07-05 NOTE — Patient Instructions (Signed)
Continue enbrel weekly. ? ?Decrease prednisone by 1/2 tablet every 2 weeks as tolerated, down to 1 or 1/2 tablet daily (10 mg or 5 mg). ? ?Can stop decreasing or go back a step if having severe flare up. ?

## 2021-07-06 ENCOUNTER — Other Ambulatory Visit: Payer: Self-pay | Admitting: Internal Medicine

## 2021-07-06 DIAGNOSIS — M0600 Rheumatoid arthritis without rheumatoid factor, unspecified site: Secondary | ICD-10-CM

## 2021-07-06 DIAGNOSIS — Z79899 Other long term (current) drug therapy: Secondary | ICD-10-CM

## 2021-07-06 LAB — CBC WITH DIFFERENTIAL/PLATELET
Absolute Monocytes: 319 cells/uL (ref 200–950)
Basophils Absolute: 18 cells/uL (ref 0–200)
Basophils Relative: 0.2 %
Eosinophils Absolute: 0 cells/uL — ABNORMAL LOW (ref 15–500)
Eosinophils Relative: 0 %
HCT: 38.2 % (ref 35.0–45.0)
Hemoglobin: 12.1 g/dL (ref 11.7–15.5)
Lymphs Abs: 2384 cells/uL (ref 850–3900)
MCH: 29.8 pg (ref 27.0–33.0)
MCHC: 31.7 g/dL — ABNORMAL LOW (ref 32.0–36.0)
MCV: 94.1 fL (ref 80.0–100.0)
MPV: 9.9 fL (ref 7.5–12.5)
Monocytes Relative: 3.5 %
Neutro Abs: 6379 cells/uL (ref 1500–7800)
Neutrophils Relative %: 70.1 %
Platelets: 287 10*3/uL (ref 140–400)
RBC: 4.06 10*6/uL (ref 3.80–5.10)
RDW: 16.7 % — ABNORMAL HIGH (ref 11.0–15.0)
Total Lymphocyte: 26.2 %
WBC: 9.1 10*3/uL (ref 3.8–10.8)

## 2021-07-06 LAB — COMPLETE METABOLIC PANEL WITH GFR
AG Ratio: 1.3 (calc) (ref 1.0–2.5)
ALT: 26 U/L (ref 6–29)
AST: 20 U/L (ref 10–35)
Albumin: 3.5 g/dL — ABNORMAL LOW (ref 3.6–5.1)
Alkaline phosphatase (APISO): 58 U/L (ref 37–153)
BUN/Creatinine Ratio: 32 (calc) — ABNORMAL HIGH (ref 6–22)
BUN: 33 mg/dL — ABNORMAL HIGH (ref 7–25)
CO2: 30 mmol/L (ref 20–32)
Calcium: 9.3 mg/dL (ref 8.6–10.4)
Chloride: 103 mmol/L (ref 98–110)
Creat: 1.02 mg/dL — ABNORMAL HIGH (ref 0.60–1.00)
Globulin: 2.6 g/dL (calc) (ref 1.9–3.7)
Glucose, Bld: 105 mg/dL — ABNORMAL HIGH (ref 65–99)
Potassium: 4.7 mmol/L (ref 3.5–5.3)
Sodium: 142 mmol/L (ref 135–146)
Total Bilirubin: 0.4 mg/dL (ref 0.2–1.2)
Total Protein: 6.1 g/dL (ref 6.1–8.1)
eGFR: 59 mL/min/{1.73_m2} — ABNORMAL LOW (ref >=60)

## 2021-07-06 LAB — C-REACTIVE PROTEIN: CRP: 8.2 mg/L — ABNORMAL HIGH (ref <8.0)

## 2021-07-06 LAB — SEDIMENTATION RATE: Sed Rate: 19 mm/h (ref 0–30)

## 2021-07-06 MED ORDER — ENBREL SURECLICK 50 MG/ML ~~LOC~~ SOAJ: 50.0000 mg | SUBCUTANEOUS | 0 refills | Status: DC

## 2021-07-06 NOTE — Progress Notes (Signed)
Lab results look fine for continuing Enbrel. Her CRP is massively improved from 306 to 8.2 which is almost normal.

## 2021-07-06 NOTE — Telephone Encounter (Signed)
Patient called requesting prescription refill of Enbrel to be sent to [facility].  Patient states there are no refills on her prescription and was told to contact our office.   ?

## 2021-07-06 NOTE — Telephone Encounter (Signed)
Next Visit: 10/09/2021 ? ?Last Visit: 07/05/2021 ? ?Last Fill: 05/11/2021 ? ?[id number] rheumatoid arthrit ? ?Current Dose per office note 07/05/2021: Enbrel 50 mg subcu weekly. ? ?Labs: 07/05/2021 Lab results look fine for continuing Enbrel.  ? ?TB Gold: 09/20/2020 Neg   ? ?Okay to refill Enbrel?  ?

## 2021-07-10 ENCOUNTER — Ambulatory Visit (INDEPENDENT_AMBULATORY_CARE_PROVIDER_SITE_OTHER): Payer: Medicare Other

## 2021-07-10 ENCOUNTER — Ambulatory Visit: Payer: Medicare Other

## 2021-07-10 VITALS — Ht 67.0 in | Wt 170.0 lb

## 2021-07-10 DIAGNOSIS — Z0000 Encounter for general adult medical examination without abnormal findings: Secondary | ICD-10-CM

## 2021-07-10 NOTE — Patient Instructions (Signed)
Lori Orozco , ?Thank you for taking time to come for your Medicare Wellness Visit. I appreciate your ongoing commitment to your health goals. Please review the following plan we discussed and let me know if I can assist you in the future.  ? ?Screening recommendations/referrals: ?Colonoscopy: Cologuard 07/25/20, had colonoscopy in November while in hospital ?Mammogram: 11/03/20 ?Bone Density: 07/03/17 ?Recommended yearly ophthalmology/optometry visit for glaucoma screening and checkup ?Recommended yearly dental visit for hygiene and checkup ? ?Vaccinations: ?Influenza vaccine: n/d ?Pneumococcal vaccine: 06/28/17 ?Tdap vaccine: 10/19/11 ?Shingles vaccine: Shingrix 09/16/17 (had reaction)   ?Covid-19:06/21/19, 07/19/19, 01/22/20, 06/04/20 ? ?Advanced directives: yes ? ?Conditions/risks identified: none ? ?Next appointment: Follow up in one year for your annual wellness visit 07/12/22 @ 1:45pm by phone ? ? ?Preventive Care 67 Years and Older, Female ?Preventive care refers to lifestyle choices and visits with your health care provider that can promote health and wellness. ?What does preventive care include? ?A yearly physical exam. This is also called an annual well check. ?Dental exams once or twice a year. ?Routine eye exams. Ask your health care provider how often you should have your eyes checked. ?Personal lifestyle choices, including: ?Daily care of your teeth and gums. ?Regular physical activity. ?Eating a healthy diet. ?Avoiding tobacco and drug use. ?Limiting alcohol use. ?Practicing safe sex. ?Taking low-dose aspirin every day. ?Taking vitamin and mineral supplements as recommended by your health care provider. ?What happens during an annual well check? ?The services and screenings done by your health care provider during your annual well check will depend on your age, overall health, lifestyle risk factors, and family history of disease. ?Counseling  ?Your health care provider may ask you questions about your: ?Alcohol  use. ?Tobacco use. ?Drug use. ?Emotional well-being. ?Home and relationship well-being. ?Sexual activity. ?Eating habits. ?History of falls. ?Memory and ability to understand (cognition). ?Work and work Astronomer. ?Reproductive health. ?Screening  ?You may have the following tests or measurements: ?Height, weight, and BMI. ?Blood pressure. ?Lipid and cholesterol levels. These may be checked every 5 years, or more frequently if you are over 46 years old. ?Skin check. ?Lung cancer screening. You may have this screening every year starting at age 39 if you have a 30-pack-year history of smoking and currently smoke or have quit within the past 15 years. ?Fecal occult blood test (FOBT) of the stool. You may have this test every year starting at age 8. ?Flexible sigmoidoscopy or colonoscopy. You may have a sigmoidoscopy every 5 years or a colonoscopy every 10 years starting at age 80. ?Hepatitis C blood test. ?Hepatitis B blood test. ?Sexually transmitted disease (STD) testing. ?Diabetes screening. This is done by checking your blood sugar (glucose) after you have not eaten for a while (fasting). You may have this done every 1-3 years. ?Bone density scan. This is done to screen for osteoporosis. You may have this done starting at age 43. ?Mammogram. This may be done every 1-2 years. Talk to your health care provider about how often you should have regular mammograms. ?Talk with your health care provider about your test results, treatment options, and if necessary, the need for more tests. ?Vaccines  ?Your health care provider may recommend certain vaccines, such as: ?Influenza vaccine. This is recommended every year. ?Tetanus, diphtheria, and acellular pertussis (Tdap, Td) vaccine. You may need a Td booster every 10 years. ?Zoster vaccine. You may need this after age 79. ?Pneumococcal 13-valent conjugate (PCV13) vaccine. One dose is recommended after age 37. ?Pneumococcal polysaccharide (PPSV23) vaccine. One  dose is  recommended after age 52. ?Talk to your health care provider about which screenings and vaccines you need and how often you need them. ?This information is not intended to replace advice given to you by your health care provider. Make sure you discuss any questions you have with your health care provider. ?Document Released: 03/11/2015 Document Revised: 11/02/2015 Document Reviewed: 12/14/2014 ?Elsevier Interactive Patient Education ? 2017 [place]. ? ?Fall Prevention in the Home ?Falls can cause injuries. They can happen to people of all ages. There are many things you can do to make your home safe and to help prevent falls. ?What can I do on the outside of my home? ?Regularly fix the edges of walkways and driveways and fix any cracks. ?Remove anything that might make you trip as you walk through a door, such as a raised step or threshold. ?Trim any bushes or trees on the path to your home. ?Use bright outdoor lighting. ?Clear any walking paths of anything that might make someone trip, such as rocks or tools. ?Regularly check to see if handrails are loose or broken. Make sure that both sides of any steps have handrails. ?Any raised decks and porches should have guardrails on the edges. ?Have any leaves, snow, or ice cleared regularly. ?Use sand or salt on walking paths during winter. ?Clean up any spills in your garage right away. This includes oil or grease spills. ?What can I do in the bathroom? ?Use night lights. ?Install grab bars by the toilet and in the tub and shower. Do not use towel bars as grab bars. ?Use non-skid mats or decals in the tub or shower. ?If you need to sit down in the shower, use a plastic, non-slip stool. ?Keep the floor dry. Clean up any water that spills on the floor as soon as it happens. ?Remove soap buildup in the tub or shower regularly. ?Attach bath mats securely with double-sided non-slip rug tape. ?Do not have throw rugs and other things on the floor that can make you  trip. ?What can I do in the bedroom? ?Use night lights. ?Make sure that you have a light by your bed that is easy to reach. ?Do not use any sheets or blankets that are too big for your bed. They should not hang down onto the floor. ?Have a firm chair that has side arms. You can use this for support while you get dressed. ?Do not have throw rugs and other things on the floor that can make you trip. ?What can I do in the kitchen? ?Clean up any spills right away. ?Avoid walking on wet floors. ?Keep items that you use a lot in easy-to-reach places. ?If you need to reach something above you, use a strong step stool that has a grab bar. ?Keep electrical cords out of the way. ?Do not use floor polish or wax that makes floors slippery. If you must use wax, use non-skid floor wax. ?Do not have throw rugs and other things on the floor that can make you trip. ?What can I do with my stairs? ?Do not leave any items on the stairs. ?Make sure that there are handrails on both sides of the stairs and use them. Fix handrails that are broken or loose. Make sure that handrails are as long as the stairways. ?Check any carpeting to make sure that it is firmly attached to the stairs. Fix any carpet that is loose or worn. ?Avoid having throw rugs at the top or bottom of the  stairs. If you do have throw rugs, attach them to the floor with carpet tape. ?Make sure that you have a light switch at the top of the stairs and the bottom of the stairs. If you do not have them, ask someone to add them for you. ?What else can I do to help prevent falls? ?Wear shoes that: ?Do not have high heels. ?Have rubber bottoms. ?Are comfortable and fit you well. ?Are closed at the toe. Do not wear sandals. ?If you use a stepladder: ?Make sure that it is fully opened. Do not climb a closed stepladder. ?Make sure that both sides of the stepladder are locked into place. ?Ask someone to hold it for you, if possible. ?Clearly mark and make sure that you can  see: ?Any grab bars or handrails. ?First and last steps. ?Where the edge of each step is. ?Use tools that help you move around (mobility aids) if they are needed. These include: ?Canes. ?Walkers. ?Scooters. ?Crutches. ?

## 2021-07-10 NOTE — Progress Notes (Signed)
?Virtual Visit via Telephone Note ? ?I connected with  Lori AmberPhyllis J Orozco on 07/10/21 at  3:45 PM EDT by telephone and verified that I am speaking with the correct person using two identifiers. ? ?Location: ?Patient: home ?Provider: [facility] [place] ?Persons participating in the virtual visit: patient/Nurse Health Advisor ?  ?I discussed the limitations, risks, security and privacy concerns of performing an evaluation and management service by telephone and the availability of in person appointments. The patient expressed understanding and agreed to proceed. ? ?Interactive audio and video telecommunications were attempted between this nurse and patient, however failed, due to patient having technical difficulties OR patient did not have access to video capability.  We continued and completed visit with audio only. ? ?Some vital signs may be absent or patient reported.  ? ?Hal HopeLorrie S Mckenna Boruff, LPN ? ?Subjective:  ? Lori Amberhyllis J Omary is a 71 y.o. female who presents for Medicare Annual (Subsequent) preventive examination. ? ?Review of Systems    ? ?  ? ?   ?Objective:  ?  ?There were no vitals filed for this visit. ?There is no height or weight on file to calculate BMI. ? ? ?  09/08/2020  ?  3:18 PM 09/01/2020  ?  5:55 PM 08/28/2020  ?  2:34 PM 02/02/2020  ?  9:21 AM 01/28/2020  ?  9:22 AM 06/30/2018  ?  8:15 AM 06/18/2017  ?  8:28 AM  ?Advanced Directives  ?Does Patient Have a Medical Advance Directive? No No No No No No Yes  ?Type of Advance Directive       Living will  ?Would patient like information on creating a medical advance directive? No - Patient declined No - Patient declined No - Patient declined  No - Patient declined No - Patient declined   ? ? ?Current Medications (verified) ?Outpatient Encounter Medications as of 07/10/2021  ?Medication Sig  ? albuterol (VENTOLIN HFA) 108 (90 Base) MCG/ACT inhaler Inhale 1-2 puffs into the lungs every 4 (four) hours as needed for wheezing or shortness of breath.  ? Black Cohosh 40  MG CAPS Take 1 capsule (40 mg total) by mouth daily.  ? budesonide-formoterol (SYMBICORT) 160-4.5 MCG/ACT inhaler Inhale 2 puffs into the lungs 2 (two) times daily.  ? buPROPion (WELLBUTRIN XL) 150 MG 24 hr tablet Take 150 mg by mouth daily.  ? busPIRone (BUSPAR) 15 MG tablet Take 15 mg by mouth 2 (two) times daily before a meal.  ? cholecalciferol (VITAMIN D) 25 MCG (1000 UNIT) tablet Take 2,000 Units by mouth daily. 2 tablets daily  ? ciprofloxacin (CIPRO) 500 MG tablet Take 1 tablet (500 mg total) by mouth 2 (two) times daily.  ? clobetasol cream (TEMOVATE) 0.05 % APPLY SMALL AMOUNT TO AFFECTED AREAS TWO TO THREE TIMES PER WEEK  ? cyanocobalamin 2000 MCG tablet Take 1,000 mcg by mouth daily.  ? diclofenac Sodium (VOLTAREN) 1 % GEL Apply 2 g topically 2 (two) times daily as needed (pain).  ? diphenhydrAMINE (BENADRYL) 25 MG tablet Take 25 mg by mouth every 6 (six) hours as needed for allergies.  ? divalproex (DEPAKOTE ER) 500 MG 24 hr tablet Take 500 mg by mouth at bedtime.   ? etanercept (ENBREL SURECLICK) 50 MG/ML injection Inject 50 mg into the skin once a week.  ? fluticasone (FLONASE) 50 MCG/ACT nasal spray PLACE 2 SPRAYS INTO THE NOSE DAILY. (Patient taking differently: Place 2 sprays into both nostrils. PLACE 2 SPRAYS INTO THE NOSE DAILY.)  ? HYDROcodone-acetaminophen (NORCO/VICODIN) 5-325  MG tablet Take 1 tablet by mouth 3 (three) times daily as needed for moderate pain.  ? ipratropium-albuterol (DUONEB) 0.5-2.5 (3) MG/3ML SOLN Take 3 mLs by nebulization every 6 (six) hours as needed.  ? iron polysaccharides (NIFEREX) 150 MG capsule Take 150 mg by mouth daily. (Patient not taking: Reported on 07/05/2021)  ? ketotifen (ZADITOR) 0.025 % ophthalmic solution Place 1 drop into both eyes 2 (two) times daily as needed (allergies).  ? levothyroxine (SYNTHROID) 50 MCG tablet TAKE ONE TABLET BY MOUTH DAILY BEFORE BREAKFAST (Patient taking differently: Take 50 mcg by mouth at bedtime.)  ? LORazepam (ATIVAN) 1 MG  tablet Take 1 mg by mouth at bedtime as needed for anxiety or sleep.  ? metoprolol succinate (TOPROL-XL) 25 MG 24 hr tablet Take 0.5 tablets (12.5 mg total) by mouth in the morning and at bedtime.  ? mirabegron ER (MYRBETRIQ) 50 MG TB24 tablet Take 1 tablet (50 mg total) by mouth daily.  ? mirtazapine (REMERON) 30 MG tablet Take 30 mg by mouth at bedtime.   ? ondansetron (ZOFRAN-ODT) 4 MG disintegrating tablet Take 1 tablet (4 mg total) by mouth every 8 (eight) hours as needed for nausea or vomiting.  ? predniSONE (DELTASONE) 10 MG tablet Take by mouth once daily as directed  ? Rhubarb (ESTROVEN COMPLETE) 4 MG TABS Take 4 mg by mouth daily.  ? sertraline (ZOLOFT) 100 MG tablet Take 100 mg by mouth daily.   ? simvastatin (ZOCOR) 20 MG tablet TAKE ONE TABLET BY MOUTH EVERY NIGHT AT BEDTIME  ? Spacer/Aero-Holding Chambers (AEROCHAMBER PLUS) inhaler Use with inhaler  ? ?No facility-administered encounter medications on file as of 07/10/2021.  ? ? ?Allergies (verified) ?Lithium; Tegretol [carbamazepine]; Tricyclic antidepressants; Methotrexate derivatives; Strawberry extract; Codeine; Cymbalta [duloxetine hcl]; Erythromycin; Lyrica [pregabalin]; Neurontin [gabapentin]; Rabeprazole sodium; Duraprep [antiseptic products, misc.]; Penicillins; and Tape  ? ?History: ?Past Medical History:  ?Diagnosis Date  ? Amaurosis fugax   ? Anemia   ? hx  ? Anxiety   ? Arthritis   ? Asthma   ? Cataract   ? Chronic fatigue syndrome   ? Chronic kidney disease   ? frequency, nephritis when 71 yrs old  ? COPD (chronic obstructive pulmonary disease) (HCC)   ? COVID-19   ? Depression   ? Diverticulitis   ? Emphysema of lung (HCC)   ? Fibromyalgia   ? GERD (gastroesophageal reflux disease)   ? occ  ? H/O hiatal hernia   ? History of bronchitis   ? Hyperlipidemia   ? Hypothyroidism   ? Interstitial cystitis   ? Irritable bowel syndrome   ? Lichen sclerosus   ? Lumbar herniated disc   ? Migraines   ? Pneumonia   ? PONV (postoperative nausea and  vomiting)   ? Shortness of breath   ? exertion   ? Thyroid disease   ? Graves  ? Urinary frequency   ? Urinary tract infection   ? Vertigo   ? ?Past Surgical History:  ?Procedure Laterality Date  ? ABDOMINAL HYSTERECTOMY    ? bladder sugery     ? CAROTID DOPPLAR    ? COLONOSCOPY  05/26/2010  ? avms- otherwise nl , re check 10y  ? DEXA-OSTEOPENIA    ? DOPPLER ECHOCARDIOGRAPHY    ? elbow surgery    ? EPICONDYLITIS    ? EYE SURGERY Bilateral   ? cataracts  ? FOOT SURGERY Bilateral   ? I & D EXTREMITY Right 09/01/2020  ? Procedure: TYNOSYNOVECTOMY;  Surgeon:  [name], MD;  Location: [facility] OR;  Service: Orthopedics;  Laterality: Right;  ? KNEE SURGERY    ? Left cartilage  ? lipoma in second finger right hand    ? PLANTAR FASCIA SURGERY Left   ? REVERSE SHOULDER ARTHROPLASTY Left 02/02/2020  ? Procedure: LEFT REVERSE SHOULDER ARTHROPLASTY;  Surgeon: [name], MD;  Location: [facility] OR;  Service: Orthopedics;  Laterality: Left;  ? ROTATOR CUFF REPAIR Left 12/2017  ? TEMPOROMANDIBULAR JOINT SURGERY    ? TONSILLECTOMY    ? TOTAL HIP ARTHROPLASTY Right 03/20/2013  ? Procedure: Right TOTAL HIP ARTHROPLASTY;  Surgeon: [name], MD;  Location: [facility] OR;  Service: Orthopedics;  Laterality: Right;  Right Total Hip Arthroplasty  ? TOTAL KNEE ARTHROPLASTY Left 01/28/2015  ? Procedure: LEFT TOTAL KNEE ARTHROPLASTY;  Surgeon: [name], MD;  Location: [facility] ORS;  Service: Orthopedics;  Laterality: Left;  ? TRIGGER FINGER RELEASE Right   ? TROCHANTERIC BURSA EXCISION Right 05/03/2016  ? Dr. [name], [name]  ? WRIST ARTHROSCOPY Right   ? ligament tear  ? ?Family History  ?Problem Relation Age of Onset  ? Arthritis Mother   ? Hypertension Mother   ? Kidney disease Mother   ? Cancer Father   ?     Bladder  ? Arthritis Sister   ? Colon cancer Other   ? Cancer - Lung Cousin   ? Arthritis Maternal Grandmother   ? ?Social History  ? ?Socioeconomic History  ? Marital status: Single  ?  Spouse name: Not on file  ?  Number of children: Not on file  ? Years of education: Not on file  ? Highest education level: Not on file  ?Occupational History  ? Not on file  ?Tobacco Use  ? Smoking status: Former  ?  Packs/day: 2.00

## 2021-07-11 ENCOUNTER — Encounter: Payer: Self-pay | Admitting: Family Medicine

## 2021-07-11 ENCOUNTER — Ambulatory Visit (INDEPENDENT_AMBULATORY_CARE_PROVIDER_SITE_OTHER): Payer: Medicare Other | Admitting: Family Medicine

## 2021-07-11 VITALS — BP 126/84 | HR 75 | Ht 67.0 in | Wt 186.6 lb

## 2021-07-11 DIAGNOSIS — R7303 Prediabetes: Secondary | ICD-10-CM

## 2021-07-11 DIAGNOSIS — E559 Vitamin D deficiency, unspecified: Secondary | ICD-10-CM

## 2021-07-11 DIAGNOSIS — E039 Hypothyroidism, unspecified: Secondary | ICD-10-CM | POA: Diagnosis not present

## 2021-07-11 DIAGNOSIS — M8580 Other specified disorders of bone density and structure, unspecified site: Secondary | ICD-10-CM | POA: Diagnosis not present

## 2021-07-11 DIAGNOSIS — D649 Anemia, unspecified: Secondary | ICD-10-CM | POA: Diagnosis not present

## 2021-07-11 DIAGNOSIS — J449 Chronic obstructive pulmonary disease, unspecified: Secondary | ICD-10-CM | POA: Diagnosis not present

## 2021-07-11 DIAGNOSIS — M0600 Rheumatoid arthritis without rheumatoid factor, unspecified site: Secondary | ICD-10-CM

## 2021-07-11 DIAGNOSIS — E7800 Pure hypercholesterolemia, unspecified: Secondary | ICD-10-CM | POA: Diagnosis not present

## 2021-07-11 DIAGNOSIS — E66811 Obesity, class 1: Secondary | ICD-10-CM

## 2021-07-11 DIAGNOSIS — I10 Essential (primary) hypertension: Secondary | ICD-10-CM | POA: Diagnosis not present

## 2021-07-11 DIAGNOSIS — R911 Solitary pulmonary nodule: Secondary | ICD-10-CM

## 2021-07-11 DIAGNOSIS — T380X5A Adverse effect of glucocorticoids and synthetic analogues, initial encounter: Secondary | ICD-10-CM | POA: Insufficient documentation

## 2021-07-11 DIAGNOSIS — Q2730 Arteriovenous malformation, site unspecified: Secondary | ICD-10-CM

## 2021-07-11 DIAGNOSIS — Z6830 Body mass index (BMI) 30.0-30.9, adult: Secondary | ICD-10-CM

## 2021-07-11 DIAGNOSIS — E6609 Other obesity due to excess calories: Secondary | ICD-10-CM

## 2021-07-11 LAB — CBC WITH DIFFERENTIAL/PLATELET
Basophils Absolute: 0.1 10*3/uL (ref 0.0–0.1)
Basophils Relative: 1.2 % (ref 0.0–3.0)
Eosinophils Absolute: 0.1 10*3/uL (ref 0.0–0.7)
Eosinophils Relative: 0.5 % (ref 0.0–5.0)
HCT: 37.9 % (ref 36.0–46.0)
Hemoglobin: 12.2 g/dL (ref 12.0–15.0)
Lymphocytes Relative: 53.5 % — ABNORMAL HIGH (ref 12.0–46.0)
Lymphs Abs: 5.1 10*3/uL — ABNORMAL HIGH (ref 0.7–4.0)
MCHC: 32.3 g/dL (ref 30.0–36.0)
MCV: 95.5 fl (ref 78.0–100.0)
Monocytes Absolute: 0.7 10*3/uL (ref 0.1–1.0)
Monocytes Relative: 6.8 % (ref 3.0–12.0)
Neutro Abs: 3.6 10*3/uL (ref 1.4–7.7)
Neutrophils Relative %: 38 % — ABNORMAL LOW (ref 43.0–77.0)
Platelets: 254 10*3/uL (ref 150.0–400.0)
RBC: 3.96 Mil/uL (ref 3.87–5.11)
RDW: 19.7 % — ABNORMAL HIGH (ref 11.5–15.5)
WBC: 9.6 10*3/uL (ref 4.0–10.5)

## 2021-07-11 LAB — HEMOGLOBIN A1C: Hgb A1c MFr Bld: 5.4 % (ref 4.6–6.5)

## 2021-07-11 LAB — VITAMIN D 25 HYDROXY (VIT D DEFICIENCY, FRACTURES): VITD: 47.24 ng/mL (ref 30.00–100.00)

## 2021-07-11 LAB — COMPREHENSIVE METABOLIC PANEL
ALT: 27 U/L (ref 0–35)
AST: 21 U/L (ref 0–37)
Albumin: 3.8 g/dL (ref 3.5–5.2)
Alkaline Phosphatase: 57 U/L (ref 39–117)
BUN: 31 mg/dL — ABNORMAL HIGH (ref 6–23)
CO2: 29 mEq/L (ref 19–32)
Calcium: 9.3 mg/dL (ref 8.4–10.5)
Chloride: 101 mEq/L (ref 96–112)
Creatinine, Ser: 1.1 mg/dL (ref 0.40–1.20)
GFR: 50.71 mL/min — ABNORMAL LOW (ref >60.00)
Glucose, Bld: 88 mg/dL (ref 70–99)
Potassium: 4.1 mEq/L (ref 3.5–5.1)
Sodium: 140 mEq/L (ref 135–145)
Total Bilirubin: 0.4 mg/dL (ref 0.2–1.2)
Total Protein: 6.4 g/dL (ref 6.0–8.3)

## 2021-07-11 LAB — TSH: TSH: 3.86 u[IU]/mL (ref 0.35–5.50)

## 2021-07-11 LAB — LIPID PANEL
Cholesterol: 221 mg/dL — ABNORMAL HIGH (ref 0–200)
HDL: 70.5 mg/dL (ref >39.00)
LDL Cholesterol: 123 mg/dL — ABNORMAL HIGH (ref 0–99)
NonHDL: 150.18
Total CHOL/HDL Ratio: 3
Triglycerides: 136 mg/dL (ref 0.0–149.0)
VLDL: 27.2 mg/dL (ref 0.0–40.0)

## 2021-07-11 NOTE — Assessment & Plan Note (Signed)
Disc goals for lipids and reasons to control them ?Rev last labs with pt ?Rev low sat fat diet in detail ? ?Labs ordered today  ?Tolerates simvastatin 20 mg daily  ?

## 2021-07-11 NOTE — Assessment & Plan Note (Addendum)
On prednisone  ?a1c ordered  ? ?Diet is not great due to cravings/hunger ?Enc pt to eat lower glycemic diet  ?

## 2021-07-11 NOTE — Assessment & Plan Note (Signed)
No missed dose  ? ?Level pending  ?Disc imp for bone and overall health ?

## 2021-07-11 NOTE — Patient Instructions (Signed)
Take care of yourself  ? ?No change in medicine  ?Let's see how labs look  ? ?I will check into the GI situation and we will get back to you  ? ? ?

## 2021-07-11 NOTE — Assessment & Plan Note (Signed)
Lab Results  ?Component Value Date  ? TSH 5.59 (H) 05/16/2021  ? ?Taking her levothyroxine more regularly/ does take at night with other med/vitamins but that has stayed the same and it is easier for her to take it then ?TSH pending on levothy 50 mcg daily  ?

## 2021-07-11 NOTE — Assessment & Plan Note (Signed)
From [facility]/ pending GI appt ? ?No active bleeding ?Off iron and hb was back to 12.1  ?Reassuring  ?

## 2021-07-11 NOTE — Assessment & Plan Note (Signed)
Sees pulmonary  ?Continues not to smoke ?symicort 160 mg bid  ?Has duo neb prn  ?Watching pulm nodule /will be in [place] lung cancer screen program per note  ?

## 2021-07-11 NOTE — Assessment & Plan Note (Signed)
Some wt gain/ pt dislikes round face and hunger  ?Is gradually coming off of as enbrel kicks in for RA ? ?a1c added to labs ?Needs dexa ?Will continue to monitor  ?

## 2021-07-11 NOTE — Assessment & Plan Note (Signed)
Per pulmonary plans to enter the CT screening program for lung cancer ?

## 2021-07-11 NOTE — Assessment & Plan Note (Signed)
bp in fair control at this time  ?BP Readings from Last 1 Encounters:  ?07/11/21 126/84  ? ?No changes needed ?Most recent labs reviewed  ?Disc lifstyle change with low sodium diet and exercise  ?Plan to continue metoprolol xl 12.5 mg daily  ? ?

## 2021-07-11 NOTE — Assessment & Plan Note (Addendum)
Nl dexa 2019  ?She is not ready to schedule another until fall  ?Unable to tolerate lying down in machine now- but working on back and joint problems  ?Risks are up from chronic prednisone ? ?No new falls or fractures  ?Taking vit D ?PT for exercise and walking with stand up walker  ?

## 2021-07-11 NOTE — Assessment & Plan Note (Signed)
Now under care of rheumatology/on enbrel with some improvement  ?Hopes to come off of prednisone due to side eff ?

## 2021-07-11 NOTE — Assessment & Plan Note (Signed)
Wt up with prednisone ?Expect this to improve when she comes off of it  ?Also mobility impaired but starting to walk with walker now ?

## 2021-07-11 NOTE — Assessment & Plan Note (Signed)
Pt is having trouble getting into GI since there was confusion re: her emergency being in another hospital system       ?Will see if there is anything we can do to get her into [place]  ?

## 2021-07-11 NOTE — Progress Notes (Signed)
? ?Subjective:  ? ? Patient ID: Lori Orozco, female    DOB: [birth year], 71 y.o.   MRN: [id number] ? ?HPI ?Pt presents for annual f/u of chronic medical problems  ? ?Wt Readings from Last 3 Encounters:  ?07/11/21 186 lb 9.6 oz (84.6 kg)  ?07/10/21 170 lb (77.1 kg)  ?07/05/21 187 lb (84.8 kg)  ? ?29.23 kg/m? ? ?Wants to eat all the time  ?Is the prednisone -will take months / gradually off of it  ? ? ?On med for RA- enbrel ?Very helpful so far !  Amazing  ? ?Planning epidurals in her back/looking forward to that  ?Really uncomfortable -her back  ? ?Imms ?Immunization History  ?Administered Date(s) Administered  ? Influenza Split 11/19/2011, 11/02/2013  ? Influenza Whole 12/20/2006, 11/25/2008, 11/26/2009, 11/24/2010  ? Influenza, High Dose Seasonal PF 12/13/2016  ? Influenza,inj,Quad PF,6+ Mos 12/06/2015  ? Influenza-Unspecified 12/08/2012, 12/03/2014, 12/08/2017, 11/10/2018, 10/31/2019  ? Moderna Sars-Covid-2 Vaccination 06/21/2019, 07/19/2019, 01/22/2020  ? PFIZER(Purple Top)SARS-COV-2 Vaccination 06/04/2020  ? Pneumococcal Conjugate-13 12/06/2015  ? Pneumococcal Polysaccharide-23 01/26/2002, 03/23/2008, 09/07/2010, 06/28/2017  ? Td 06/26/2000, 10/19/2011  ? Zoster Recombinat (Shingrix) 09/16/2017  ? 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 ?Trying to get appt with GI , having trouble with that  ?Has seen Dr Russella Dar in the past , [place]  ? ? ?Mammogram 10/2020 ?Self breast exam - no lumps  ? ?Dexa 06/2017-nl bmd  ?Not ready to schedule that yet  ?Maybe in the fall  ?Needs pain in better control  ? ?Still doing PT at home ?Walking with stand up walker and is walking on her own more   ? ?Taking vitamin D ? ? ?HTN ? ? ?bp is stable today  ?No cp or palpitations or headaches or edema  ?No side effects to medicines  ?BP Readings from Last 3 Encounters:  ?07/11/21 126/84   ?07/05/21 (!) 151/77  ?06/29/21 (!) 149/74  ?  Metoprolol xl 12.5 mg daily  ? ?Copd ?Pulm f/u ?CT lung cancer screening program now /pt unaware  ?Has f/u 6 mo  ? ? ?Hypothyroidism  ?Pt has no clinical changes ?No change in energy level/ hair or skin/ edema and no tremor ?Lab Results  ?Component Value Date  ? TSH 5.59 (H) 05/16/2021  ?  ?Levothyroxine  ?Back on 50 mcg daily  ?Takes at night  ?Does take vitamins also ? ? ?Hyperlipidemia ?Lab Results  ?Component Value Date  ? CHOL 192 07/08/2020  ? HDL 70.30 07/08/2020  ? LDLCALC 101 (H) 07/08/2020  ? TRIG 101.0 07/08/2020  ? CHOLHDL 3 07/08/2020  ? ?Simvastatin 20 mg daily  ? ?Iron def ?Lab Results  ?Component Value Date  ? WBC 9.1 07/05/2021  ? HGB 12.1 07/05/2021  ? HCT 38.2 07/05/2021  ? MCV 94.1 07/05/2021  ? PLT 287 07/05/2021  ? ?Lab Results  ?Component Value Date  ? IRON 43 05/16/2021  ? FERRITIN 589.1 (H) 05/16/2021  ?  ?Niferex 150 mg daily - is off now ? ?Patient Active Problem List  ? Diagnosis Date Noted  ? Adverse effect of prednisone 07/11/2021  ? Prediabetes 07/11/2021  ? 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 History of pulmonary embolism 12/08/2020  ? Arthritis of knee 12/06/2020  ? Bilateral knee pain 09/20/2020  ? 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  ? High risk medication use 09/20/2020  ?  Extensor tenosynovitis of right wrist 09/15/2020  ? S/P reverse total shoulder arthroplasty, 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  ? Lung nodule 01/02/2020  ? COVID-19 03/31/2019  ? Status post arthroscopy of left shoulder 01/16/2018  ? Estrogen deficiency 06/28/2017  ? Medial epicondylitis, left elbow 04/29/2017  ? S/P arthroscopy of left shoulder 02/25/2017  ? Impingement syndrome of left shoulder 01/30/2017  ? Pedal edema 10/26/2016  ? Trochanteric bursitis, right hip 04/10/2016  ? Essential hypertension 09/02/2015  ? Urge incontinence 07/27/2015  ? Obesity 04/28/2015  ? Screening for HIV (human immunodeficiency virus) 04/26/2015  ? Osteoarthritis of left  knee 01/28/2015  ? Status post total left knee replacement 01/28/2015  ? Vitamin D deficiency 04/27/2014  ? Seasonal and perennial allergic rhinitis 06/17/2013  ? S/P total hip arthroplasty 03/20/2013  ? Medicare annual wellness visit, subsequent 11/18/2012  ? History of falling 11/18/2012  ? Routine general medical examination at a health care facility 11/10/2012  ? Lichen sclerosus et atrophicus of the vulva 07/29/2012  ? Osteopenia 10/19/2011  ? Hypothyroidism 10/19/2011  ? ANEMIA 10/11/2009  ? PERIPHERAL NEUROPATHY, FEET 08/05/2009  ? HAND PAIN, BILATERAL 08/05/2009  ? TREMOR 03/09/2008  ? MENOPAUSAL SYNDROME 10/09/2007  ? DEPRESSION, MAJOR 05/20/2007  ? BIPOLAR AFFECTIVE DISORDER 05/20/2007  ? Generalized anxiety disorder 05/20/2007  ? PERSONALITY DISORDER 05/20/2007  ? ESOPHAGEAL SPASM 05/20/2007  ? Diaphragmatic hernia 05/20/2007  ? AMAUROSIS FUGAX 05/07/2007  ? COPD mixed type (HCC) 03/27/2007  ? HYPERCHOLESTEROLEMIA, PURE 12/10/2006  ? SYMPTOM, SYNDROME, CHRONIC FATIGUE 12/10/2006  ? ?Past Medical History:  ?Diagnosis Date  ? Amaurosis fugax   ? Anemia   ? hx  ? Anxiety   ? Arthritis   ? Asthma   ? Cataract   ? Chronic fatigue syndrome   ? Chronic kidney disease   ? frequency, nephritis when 71 yrs old  ? COPD (chronic obstructive pulmonary disease) (HCC)   ? COVID-19   ? Depression   ? Diverticulitis   ? Emphysema of lung (HCC)   ? Fibromyalgia   ? GERD (gastroesophageal reflux disease)   ? occ  ? H/O hiatal hernia   ? History of bronchitis   ? Hyperlipidemia   ? Hypothyroidism   ? Interstitial cystitis   ? Irritable bowel syndrome   ? Lichen sclerosus   ? Lumbar herniated disc   ? Migraines   ? Pneumonia   ? PONV (postoperative nausea and vomiting)   ? Shortness of breath   ? exertion   ? Thyroid disease   ? Graves  ? Urinary frequency   ? Urinary tract infection   ? Vertigo   ? ?Past Surgical History:  ?Procedure Laterality Date  ? ABDOMINAL HYSTERECTOMY    ? bladder sugery     ? CAROTID DOPPLAR    ?  COLONOSCOPY  05/26/2010  ? avms- otherwise nl , re check 10y  ? DEXA-OSTEOPENIA    ? DOPPLER ECHOCARDIOGRAPHY    ? elbow surgery    ? EPICONDYLITIS    ? EYE SURGERY Bilateral   ? cataracts  ? FOOT SURGERY Bilateral   ? I & D EXTREMITY Right 09/01/2020  ? Procedure: TYNOSYNOVECTOMY;  Surgeon: [name], MD;  Location: [facility] OR;  Service: Orthopedics;  Laterality: Right;  ? KNEE SURGERY    ? Left cartilage  ? lipoma in second finger right hand    ? PLANTAR FASCIA SURGERY Left   ? REVERSE SHOULDER ARTHROPLASTY Left 02/02/2020  ? Procedure: LEFT REVERSE SHOULDER ARTHROPLASTY;  Surgeon: August Saucer,  Gregory Scott, MD;  [facility];  Service: Orthopedics;  Laterality: Left;  ? ROTATOR CUFF REPAIR Left 12/2017  ? TEMPOROMANDIBULAR JOINT SURGERY    ? TONSILLECTOMY    ? TOTAL HIP ARTHROPLASTY Right 03/20/2013  ? Procedure: Right TOTAL HIP ARTHROPLASTY;  Surgeon: [name], MD;  Location: [facility] OR;  Service: Orthopedics;  Laterality: Right;  Right Total Hip Arthroplasty  ? TOTAL KNEE ARTHROPLASTY Left 01/28/2015  ? Procedure: LEFT TOTAL KNEE ARTHROPLASTY;  Surgeon: [name], MD;  Location: [facility] ORS;  Service: Orthopedics;  Laterality: Left;  ? TRIGGER FINGER RELEASE Right   ? TROCHANTERIC BURSA EXCISION Right 05/03/2016  ? Dr. [name], [name]  ? WRIST ARTHROSCOPY Right   ? ligament tear  ? ?Social History  ? ?Tobacco Use  ? Smoking status: Former  ?  Packs/day: 2.00  ?  Years: 40.00  ?  Pack years: 80.00  ?  Types: E-cigarettes, Cigarettes  ?  Quit date: 2015  ?  Years since quitting: 8.3  ? Smokeless tobacco: Never  ?Vaping Use  ? Vaping Use: Former  ? Quit date: 11/26/2020  ? Devices: no nicotine in it  ?Substance Use Topics  ? Alcohol use: No  ?  Alcohol/week: 0.0 standard drinks  ? Drug use: No  ? ?Family History  ?Problem Relation Age of Onset  ? Arthritis Mother   ? Hypertension Mother   ? Kidney disease Mother   ? Cancer Father   ?     Bladder  ? Arthritis Sister   ? Colon cancer Other   ? Cancer -  Lung Cousin   ? Arthritis Maternal Grandmother   ? ?Allergies  ?Allergen Reactions  ? Lithium Anaphylaxis  ? Tegretol [Carbamazepine] Other (See Comments)  ?  Fever and body aches (fever over 103)  ? Tricyc

## 2021-07-12 ENCOUNTER — Encounter: Payer: Self-pay | Admitting: *Deleted

## 2021-07-26 ENCOUNTER — Encounter: Payer: Self-pay | Admitting: Family Medicine

## 2021-07-27 ENCOUNTER — Encounter: Payer: Self-pay | Admitting: Physical Medicine and Rehabilitation

## 2021-07-27 ENCOUNTER — Ambulatory Visit: Payer: Self-pay

## 2021-07-27 ENCOUNTER — Ambulatory Visit (INDEPENDENT_AMBULATORY_CARE_PROVIDER_SITE_OTHER): Payer: Medicare Other | Admitting: Physical Medicine and Rehabilitation

## 2021-07-27 ENCOUNTER — Encounter: Payer: Self-pay | Admitting: Internal Medicine

## 2021-07-27 VITALS — BP 154/75 | HR 74

## 2021-07-27 DIAGNOSIS — M47816 Spondylosis without myelopathy or radiculopathy, lumbar region: Secondary | ICD-10-CM

## 2021-07-27 MED ORDER — BUPIVACAINE HCL 0.5 % IJ SOLN
3.0000 mL | Freq: Once | INTRAMUSCULAR | Status: AC
  Administered 2021-07-27: 3 mL

## 2021-07-27 NOTE — Progress Notes (Signed)
Numeric Pain Rating Scale and Functional Assessment Average Pain 10   In the last MONTH (on 0-10 scale) has pain interfered with the following?  1. General activity like being  able to carry out your everyday physical activities such as walking, climbing stairs, carrying groceries, or moving a chair?  Rating(10)----but can't do any of the above   +Driver, -BT, -Dye Allergies.    Pain is just in the back, no worse side

## 2021-07-28 NOTE — Telephone Encounter (Signed)
I spoke with pt and she said her arms and hands are hurting and would like appt with Dr Milinda Antis. Pt scheduled appt with Dr Milinda Antis on 08/02/21 at 2 PM. UC & ED precautions given and pt voiced understanding and will keep appt next wk with Dr Milinda Antis and pt appreciative of call. Sending note to Dr Milinda Antis as Lorain Childes.

## 2021-08-01 ENCOUNTER — Ambulatory Visit (INDEPENDENT_AMBULATORY_CARE_PROVIDER_SITE_OTHER): Payer: Medicare Other | Admitting: Pharmacist

## 2021-08-01 ENCOUNTER — Telehealth: Payer: Self-pay | Admitting: Pharmacist

## 2021-08-01 DIAGNOSIS — E7800 Pure hypercholesterolemia, unspecified: Secondary | ICD-10-CM

## 2021-08-01 DIAGNOSIS — E039 Hypothyroidism, unspecified: Secondary | ICD-10-CM

## 2021-08-01 DIAGNOSIS — M8580 Other specified disorders of bone density and structure, unspecified site: Secondary | ICD-10-CM

## 2021-08-01 DIAGNOSIS — I10 Essential (primary) hypertension: Secondary | ICD-10-CM

## 2021-08-01 DIAGNOSIS — N951 Menopausal and female climacteric states: Secondary | ICD-10-CM

## 2021-08-01 DIAGNOSIS — J449 Chronic obstructive pulmonary disease, unspecified: Secondary | ICD-10-CM

## 2021-08-01 DIAGNOSIS — F319 Bipolar disorder, unspecified: Secondary | ICD-10-CM

## 2021-08-01 DIAGNOSIS — M0600 Rheumatoid arthritis without rheumatoid factor, unspecified site: Secondary | ICD-10-CM

## 2021-08-01 DIAGNOSIS — D649 Anemia, unspecified: Secondary | ICD-10-CM

## 2021-08-01 NOTE — Telephone Encounter (Signed)
I will discuss at her visit  Thanks for the input

## 2021-08-01 NOTE — Telephone Encounter (Signed)
-  Pt reports pins/needles and pain when the shower water hits her skin and she dries off; she has stopped showering due to this and takes sponge-baths instead; she reports washing her hands under running water does not cause any pain -Pt has messaged PCP and Rheumatologist about this issue and they suspect neuropathy; pt has not tolerated gabapentin (100 mg HS) or pregabalin (75 mg BID) in the past due to dizziness/"passing out" feeling; she has not tolerated duloxetine for same reason (dose unknown) -May consider trial of lower-dose pregabalin 25 mg, titrate dose gradually

## 2021-08-01 NOTE — Progress Notes (Signed)
Chronic Care Management Pharmacy Note  08/01/2021 Name:  Lori Orozco MRN:  [id number] DOB:  [birth year]  Summary: CCM Initial visit -Reviewed medications; pt affirms compliance as prescribed -Query neuropathy - pt reports pins/needles sensation and significant pain when shower water hits her skin, she has stopped showering due to this and takes sponge baths instead; she has messaged rheumatology and best guess is neuropathy; she has not tolerated gabapentin, pregabalin due to "passing out" feeling in the past  Recommendations/Changes made from today's visit: -Consider trial of low-dose pregabalin 25 mg, titrate dose slowly as tolerated - pt to discuss with PCP  Plan: -[facility] will call patient 1 months for BP update -Pharmacist follow up televisit scheduled for 3 months -PCP visit 08/02/21   Subjective: Lori Orozco is an 71 y.o. year old female who is a primary patient of Tower, Wynelle Fanny, MD.  The CCM team was consulted for assistance with disease management and care coordination needs.    Engaged with patient by telephone for follow up visit in response to provider referral for pharmacy case management and/or care coordination services.   Consent to Services:  The patient was given information about Chronic Care Management services, agreed to services, and gave verbal consent prior to initiation of services.  Please see initial visit note for detailed documentation.   Patient Care Team: Tower, Wynelle Fanny, MD as PCP - Aris Georgia, MD as Consulting Physician (Ophthalmology) Pearson Grippe, MD as Referring Physician (Psychiatry) Emily Filbert, MD (Inactive) as Consulting Physician (Obstetrics and Gynecology) Deneise Lever, MD as Consulting Physician (Pulmonary Disease) Mcarthur Rossetti, MD as Consulting Physician (Orthopedic Surgery) Newt Minion, MD as Consulting Physician (Orthopedic Surgery) Magnus Sinning, MD as Consulting Physician  (Physical Medicine and Rehabilitation) Melinda Crutch, MD as Referring Physician (Otolaryngology) Mel Almond, PhD as Referring Physician (Psychology) Charlton Haws, [facility] as Pharmacist (Pharmacist)  Recent office visits: 07/11/21 Dr Glori Bickers OV: annual exam - labs stable. No changes.  05/16/21 Dr Glori Bickers OV: hospital f/u (covid and PE); increase Myrbetriq to 50 mg; iron high, hold Niferex and recheck iron 1 month.  03/01/21-PCP-Marne Tower,MD VV: Hospital f/u (Covid). Need to get Symbicort covered.  02/10/21-PCP-Marne Tower,MD-Telemedicine- Hospital follow up -Continue PT in home No medication changes  Recent consult visits: 07/05/21 Dr Benjamine Mola (Rheumatology): RA. Improved with Enbrel. Start tapering prednisone to 10 mg. Consider leflunomide in future if needed for steroid-sparing.  06/29/21 [facility]: COPD exacerbation. Rx'd cipro, prednisone 20 mg, albuterol.  06/19/21 Dr Annamaria Boots (pulmonary): f/u COPD. No med changes.  05/25/21 Dr Elly Modena (OB/Gyn): postmenopausal sx - rec Black Cohosh, Rhubarb for hot flashes.  05/22/21 PA Clark (ortho): f/u Knee OA. Steroid inj given.  02/25/21-Internal Medicine-Padageshwar Sunkara,MD- Telemedicine hospital follow up- no medication changes 02/08/21-Rheumatology-Christopher Rice,MD-Patient presented for follow up rheumatoid arthritis.Recommending Enbrel start for highly active seronegative arthritis(labs reviewed)No improvement to hydroxychloroquine and side effects with methotrexate.  02/02/21-Orthopedic Surgery-Gil Clark,PA-Patient presented for right knee pain.Injection with prednisone and lidocaine.  11/16/20-OB-GYN-Ugonna Anyanwu,MD-Patient presented for follow up lichen, sclerosus and menopausal symptoms.- clobetasol cream, (PREMARIN) 0.625 MG tablet  11/15/20-Rheumatolog-Christopher Rice,MD- Patient presented for follow up.Will discontinue methotrexate and start hydroxychloroquine 400 mg PO daily.also recommend restarting prednisone at 40 mg PO daily  for 10 days if helpful can taper otherwise discontinue.  Hospital visits: Medication Reconciliation was completed by comparing discharge summary, patient's EMR and Pharmacy list, and upon discussion with patient.   Admitted to the hospital on 02/13/21 due to Hypoxic respiratory  failure. Discharge date was 02/23/21. Discharged from [facility], COPD, CAP, recent PE -cannot tolerate anticoagulation (HGB drop requiring transfusion)   Medication Changes at Hospital Discharge: -Changed to Symbicort inhaler twice a day , iron supplement daily , Metoprolol twice a day, pantoprazole once daily    Medications Discontinued at Hospital Discharge: -Stopped Eliquis ,premarin ,aspirin ,ibuprofen,   Medications that remain the same after Hospital Discharge:??  -All other medications will remain the same.     Admitted to the hospital on 12/06/20 due to knee pain,pulmonary embolism. Discharge date was 01/03/21. Discharged from [place] (Pulmonary Care) [place]?Medications Started at [facility] Discharge:?? -started Eluquis,carvedilol,lidoderm patches, melatonin,pantoprazole,pravastatin.   Medication Changes at Hospital Discharge: -Changed Baclofen,buspirone,oxycodone,prednisone   Medications that remain the same after Hospital Discharge:??  -All other medications will remain the same.        Objective:  Lab Results  Component Value Date   CREATININE 1.10 07/11/2021   BUN 31 (H) 07/11/2021   GFR 50.71 (L) 07/11/2021   EGFR 59 (L) 07/05/2021   GFRNONAA >60 09/06/2020   GFRAA >60 01/29/2015   NA 140 07/11/2021   K 4.1 07/11/2021   CALCIUM 9.3 07/11/2021   CO2 29 07/11/2021   GLUCOSE 88 07/11/2021    Lab Results  Component Value Date/Time   HGBA1C 5.4 07/11/2021 09:38 AM   HGBA1C 5.5 08/29/2020 04:12 AM   GFR 50.71 (L) 07/11/2021 09:38 AM   GFR 49.15 (L) 05/16/2021 03:26 PM    Last diabetic Eye exam: No results found for:  HMDIABEYEEXA  Last diabetic Foot exam: No results found for: HMDIABFOOTEX   Lab Results  Component Value Date   CHOL 221 (H) 07/11/2021   HDL 70.50 07/11/2021   LDLCALC 123 (H) 07/11/2021   TRIG 136.0 07/11/2021   CHOLHDL 3 07/11/2021       Latest Ref Rng & Units 07/11/2021    9:38 AM 07/05/2021    1:47 PM 05/16/2021    3:26 PM  Hepatic Function  Total Protein 6.0 - 8.3 g/dL 6.4   6.1   6.9    Albumin 3.5 - 5.2 g/dL 3.8    3.6    AST 0 - 37 U/L '21   20   29    ' ALT 0 - 35 U/L 27   26   46    Alk Phosphatase 39 - 117 U/L 57    71    Total Bilirubin 0.2 - 1.2 mg/dL 0.4   0.4   0.3      Lab Results  Component Value Date/Time   TSH 3.86 07/11/2021 09:38 AM   TSH 5.59 (H) 05/16/2021 03:26 PM   FREET4 1.08 11/19/2011 03:23 PM       Latest Ref Rng & Units 07/11/2021    9:38 AM 07/05/2021    1:47 PM 05/16/2021    3:26 PM  CBC  WBC 4.0 - 10.5 K/uL 9.6   9.1   14.2    Hemoglobin 12.0 - 15.0 g/dL 12.2   12.1   10.5    Hematocrit 36.0 - 46.0 % 37.9   38.2   32.8    Platelets 150.0 - 400.0 K/uL 254.0   287   447.0      Lab Results  Component Value Date/Time   VD25OH 47.24 07/11/2021 09:38 AM   VD25OH 52.85 07/08/2020 09:11 AM    Clinical ASCVD: No  The 10-year ASCVD risk score (Arnett DK,  et al., 2019) is: 29.4%   Values used to calculate the score:     Age: 36 years     Sex: Female     Is Non-Hispanic African American: No     Diabetic: No     Tobacco smoker: Yes     Systolic Blood Pressure: 161 mmHg     Is BP treated: Yes     HDL Cholesterol: 70.5 mg/dL     Total Cholesterol: 221 mg/dL       07/11/2021    8:50 AM 07/10/2021    3:50 PM 05/16/2021    2:59 PM  Depression screen PHQ 2/9  Decreased Interest 0 0 0  Down, Depressed, Hopeless 0 3 0  PHQ - 2 Score 0 3 0  Altered sleeping 0 0   Tired, decreased energy 3 1   Change in appetite 3 0   Feeling bad or failure about yourself  0 0   Trouble concentrating 1 0   Moving slowly or fidgety/restless  0   Suicidal  thoughts 0 0   PHQ-9 Score 7 4   Difficult doing work/chores  Not difficult at all     Social History   Tobacco Use  Smoking Status Former   Packs/day: 2.00   Years: 40.00   Pack years: 80.00   Types: E-cigarettes, Cigarettes   Quit date: 2015   Years since quitting: 8.4  Smokeless Tobacco Never   BP Readings from Last 3 Encounters:  07/27/21 (!) 154/75  07/11/21 126/84  07/05/21 (!) 151/77   Pulse Readings from Last 3 Encounters:  07/27/21 74  07/11/21 75  07/05/21 89   Wt Readings from Last 3 Encounters:  07/11/21 186 lb 9.6 oz (84.6 kg)  07/10/21 170 lb (77.1 kg)  07/05/21 187 lb (84.8 kg)   BMI Readings from Last 3 Encounters:  07/11/21 29.23 kg/m  07/10/21 26.63 kg/m  07/05/21 29.29 kg/m    Assessment/Interventions: Review of patient past medical history, allergies, medications, health status, including review of consultants reports, laboratory and other test data, was performed as part of comprehensive evaluation and provision of chronic care management services.   SDOH:  (Social Determinants of Health) assessments and interventions performed: No - done May 2023   SDOH Screenings   Alcohol Screen: Low Risk    Last Alcohol Screening Score (AUDIT): 0  Depression (PHQ2-9): Medium Risk   PHQ-2 Score: 7  Financial Resource Strain: Low Risk    Difficulty of Paying Living Expenses: Not hard at all  Food Insecurity: No Food Insecurity   Worried About Charity fundraiser in the Last Year: Never true   Ran Out of Food in the Last Year: Never true  Housing: Low Risk    Last Housing Risk Score: 0  Physical Activity: Inactive   Days of Exercise per Week: 0 days   Minutes of Exercise per Session: 0 min  Social Connections: Moderately Isolated   Frequency of Communication with Friends and Family: More than three times a week   Frequency of Social Gatherings with Friends and Family: Once a week   Attends Religious Services: Never   Marine scientist or  Organizations: No   Attends Archivist Meetings: Never   Marital Status: Living with partner  Stress: No Stress Concern Present   Feeling of Stress : Only a little  Tobacco Use: Medium Risk   Smoking Tobacco Use: Former   Smokeless Tobacco Use: Never   Passive Exposure: Not on file  Transportation Needs: No Data processing manager (Medical): No   Lack of Transportation (Non-Medical): No    CCM Care Plan  Allergies  Allergen Reactions   Lithium Anaphylaxis   Tegretol [Carbamazepine] Other (See Comments)    Fever and body aches (fever over 962)   Tricyclic Antidepressants Anaphylaxis    Other reaction(s): Unknown   Methotrexate Derivatives Other (See Comments)    Urinary retention and dizziness  Other reaction(s): Other UTI   Strawberry Extract Hives   Codeine Nausea Only    Makes pt stay awake   Cymbalta [Duloxetine Hcl] Other (See Comments)    Makes pt pass out    Erythromycin     abdominal pain   Lyrica [Pregabalin]     Felt faint   Neurontin [Gabapentin]     Passes  out   Rabeprazole Sodium     insomnia   Duraprep [Antiseptic Products, Misc.] Itching and Rash    Tolerates Betadine    Penicillins Rash    Tolerated ANCEF 02/02/20  Has patient had a PCN reaction causing immediate rash, facial/tongue/throat swelling, SOB or lightheadedness with hypotension: Yes Has patient had a PCN reaction causing severe rash involving mucus membranes or skin necrosis: No Has patient had a PCN reaction that required hospitalization No Has patient had a PCN reaction occurring within the last 10 years: No If all of the above answers are "NO", then may proceed with Cephalosporin use.    Tape Rash    PT ALLERGIC NYLON TAPE     Medications Reviewed Today     Reviewed by Minda Ditto, Alyse Low, RT (Technologist) on 07/27/21 at [facility] List Status: <None>   Medication Order Taking? Sig Documenting Provider Last Dose Status Informant  albuterol  (VENTOLIN HFA) 108 (90 Base) MCG/ACT inhaler [id number] No Inhale 1-2 puffs into the lungs every 4 (four) hours as needed for wheezing or shortness of breath. Melynda Ripple, MD Taking Active   Black Cohosh 40 MG CAPS [id number] No Take 1 capsule (40 mg total) by mouth daily. Constant, Peggy, MD Taking Active   budesonide-formoterol [facility]) 160-4.5 MCG/ACT inhaler [id number] No Inhale 2 puffs into the lungs 2 (two) times daily. Tower, Wynelle Fanny, MD Taking Active   bupivacaine (MARCAINE) 0.5 % (with pres) injection 3 mL [id number]   Magnus Sinning, MD  Active   buPROPion (WELLBUTRIN XL) 150 MG 24 hr tablet [id number] No Take 150 mg by mouth daily. [provider] Taking Active Self  busPIRone (BUSPAR) 15 MG tablet [id number] No Take 15 mg by mouth 2 (two) times daily before a meal. [provider] Taking Active Self  cholecalciferol (VITAMIN D) 25 MCG (1000 UNIT) tablet [id number] No Take 2,000 Units by mouth daily. 2 tablets daily [provider] Taking Active Self  clobetasol cream (TEMOVATE) 0.05 % [id number] No APPLY SMALL AMOUNT TO AFFECTED AREAS TWO TO THREE TIMES PER WEEK Anyanwu, Sallyanne Havers, MD Taking Active   cyanocobalamin 2000 MCG tablet [id number] No Take 1,000 mcg by mouth daily. [provider] Taking Active Self  diclofenac Sodium (VOLTAREN) 1 % GEL [id number] No Apply 2 g topically 2 (two) times daily as needed (pain). Nolberto Hanlon, MD Taking Active   diphenhydrAMINE (BENADRYL) 25 MG tablet [id number] No Take 25 mg by mouth every 6 (six) hours as needed for allergies. [provider] Taking Active Self  divalproex (DEPAKOTE ER) 500 MG 24 hr tablet [id number] No Take 500 mg by mouth at bedtime.  [provider] Taking  Active Self  etanercept (ENBREL SURECLICK) 50 MG/ML injection [id number] No Inject 50 mg into the skin once a week. Collier Salina, MD Taking Active   fluticasone [facility]) 50 MCG/ACT nasal spray [id number] No PLACE 2 SPRAYS INTO  THE NOSE DAILY.  Patient taking differently: Place 2 sprays into both nostrils. PLACE 2 SPRAYS INTO THE NOSE DAILY.   Tower, Wynelle Fanny, MD Taking Active   HYDROcodone-acetaminophen (NORCO/VICODIN) 5-325 MG tablet [id number] No Take 1 tablet by mouth 3 (three) times daily as needed for moderate pain. Mcarthur Rossetti, MD Taking Active   ipratropium-albuterol (DUONEB) 0.5-2.5 (3) MG/3ML SOLN [id number] No Take 3 mLs by nebulization every 6 (six) hours as needed. Deneise Lever, MD Taking Active            Med Note Jimmey Ralph, [facility] I   Sun Aug 28, 2020 10:00 PM)    ketotifen (ZADITOR) 0.025 % ophthalmic solution [id number] No Place 1 drop into both eyes 2 (two) times daily as needed (allergies). [provider] Taking Active Self  levothyroxine (SYNTHROID) 50 MCG tablet [id number] No TAKE ONE TABLET BY MOUTH DAILY BEFORE BREAKFAST  Patient taking differently: Take 50 mcg by mouth at bedtime.   Tower, Wynelle Fanny, MD Taking Active   LORazepam (ATIVAN) 1 MG tablet [id number] No Take 1 mg by mouth at bedtime as needed for anxiety or sleep. [provider] Taking Active Self  metoprolol succinate (TOPROL-XL) 25 MG 24 hr tablet [id number] No Take 0.5 tablets (12.5 mg total) by mouth in the morning and at bedtime. Tower, Wynelle Fanny, MD Taking Active   mirabegron ER (MYRBETRIQ) 50 MG TB24 tablet [id number] No Take 1 tablet (50 mg total) by mouth daily. Tower, Wynelle Fanny, MD Taking Active   mirtazapine (REMERON) 30 MG tablet [id number] No Take 30 mg by mouth at bedtime.  [provider] Taking Active Self           Med Note Damita Dunnings, [facility] Rising   Fri Apr 04, 2018  8:11 AM)    ondansetron (ZOFRAN-ODT) 4 MG disintegrating tablet [id number] No Take 1 tablet (4 mg total) by mouth every 8 (eight) hours as needed for nausea or vomiting. Tower, Wynelle Fanny, MD Taking Active   predniSONE (DELTASONE) 10 MG tablet [id number] No Take by mouth once daily as directed Rice, Resa Miner, MD Taking Active    Rhubarb (ESTROVEN COMPLETE) 4 MG TABS [id number] No Take 4 mg by mouth daily. Constant, Peggy, MD Taking Active   sertraline (ZOLOFT) 100 MG tablet [id number] No Take 100 mg by mouth daily.  [provider] Taking Active Self           Med Note Damita Dunnings, Elveria Rising   Fri Apr 04, 2018  8:12 AM)    simvastatin (ZOCOR) 20 MG tablet [id number] No TAKE ONE TABLET BY MOUTH EVERY NIGHT AT BEDTIME Tower, Wynelle Fanny, MD Taking Active Self  Spacer/Aero-Holding Chambers (AEROCHAMBER PLUS) inhaler [id number] No Use with inhaler Melynda Ripple, MD Taking Active             Patient Active Problem List   Diagnosis Date Noted   Adverse effect of prednisone 07/11/2021   Prediabetes 07/11/2021   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   History of pulmonary embolism 12/08/2020   Arthritis of knee 12/06/2020   Bilateral knee pain 09/20/2020   Seronegative rheumatoid arthritis ([place]) 09/20/2020   High risk medication use 09/20/2020   Extensor tenosynovitis of right wrist 09/15/2020   S/P reverse total shoulder arthroplasty, left  02/02/2020   Lung nodule 01/02/2020   COVID-19 03/31/2019   Status post arthroscopy of left shoulder 01/16/2018   Estrogen deficiency 06/28/2017   Medial epicondylitis, left elbow 04/29/2017   S/P arthroscopy of left shoulder 02/25/2017   Impingement syndrome of left shoulder 01/30/2017   Pedal edema 10/26/2016   Trochanteric bursitis, right hip 04/10/2016   Essential hypertension 09/02/2015   Urge incontinence 07/27/2015   Obesity 04/28/2015   Screening for HIV (human immunodeficiency virus) 04/26/2015   Osteoarthritis of left knee 01/28/2015   Status post total left knee replacement 01/28/2015   Vitamin D deficiency 04/27/2014   Seasonal and perennial allergic rhinitis 06/17/2013   S/P total hip arthroplasty 03/20/2013   Medicare annual wellness visit, subsequent 11/18/2012   History of falling 11/18/2012   Routine general medical examination at a health care  facility 24/26/8341   Lichen sclerosus et atrophicus of the vulva 07/29/2012   Osteopenia 10/19/2011   Hypothyroidism 10/19/2011   ANEMIA 10/11/2009   PERIPHERAL NEUROPATHY, FEET 08/05/2009   HAND PAIN, BILATERAL 08/05/2009   TREMOR 03/09/2008   MENOPAUSAL SYNDROME 10/09/2007   DEPRESSION, MAJOR 05/20/2007   BIPOLAR AFFECTIVE DISORDER 05/20/2007   Generalized anxiety disorder 05/20/2007   PERSONALITY DISORDER 05/20/2007   ESOPHAGEAL SPASM 05/20/2007   Diaphragmatic hernia 05/20/2007   AMAUROSIS FUGAX 05/07/2007   COPD mixed type ([place]) 03/27/2007   HYPERCHOLESTEROLEMIA, PURE 12/10/2006   SYMPTOM, SYNDROME, CHRONIC FATIGUE 12/10/2006    Immunization History  Administered Date(s) Administered   Influenza Split 11/19/2011, 11/02/2013   Influenza Whole 12/20/2006, 11/25/2008, 11/26/2009, 11/24/2010   Influenza, High Dose Seasonal PF 12/13/2016   Influenza,inj,Quad PF,6+ Mos 12/06/2015   Influenza-Unspecified 12/08/2012, 12/03/2014, 12/08/2017, 11/10/2018, 10/31/2019   Moderna Sars-Covid-2 Vaccination 06/21/2019, 07/19/2019, 01/22/2020   PFIZER(Purple Top)SARS-COV-2 Vaccination 06/04/2020   Pneumococcal Conjugate-13 12/06/2015   Pneumococcal Polysaccharide-23 01/26/2002, 03/23/2008, 09/07/2010, 06/28/2017   Td 06/26/2000, 10/19/2011   Zoster Recombinat (Shingrix) 09/16/2017    Conditions to be addressed/monitored:  Hypertension, Hyperlipidemia, COPD, Chronic Kidney Disease, Hypothyroidism, and Overactive Bladder, RA  Care Plan : [place]  Updates made by Charlton Haws, [place] since 08/01/2021 12:00 AM     Problem: Hypertension, Hyperlipidemia, COPD, Chronic Kidney Disease, Hypothyroidism, and Overactive Bladder, RA   Priority: High     Long-Range Goal: Disease mgmt   Start Date: 05/15/2021  Expected End Date: 05/16/2022  This Visit's Progress: On track  Recent Progress: On track  Priority: High  Note:   Current Barriers:  None identified  Pharmacist  Clinical Goal(s):  Patient will contact provider office for questions/concerns as evidenced notation of same in electronic health record through collaboration with PharmD and provider.   Interventions: 1:1 collaboration with Tower, Wynelle Fanny, MD regarding development and update of comprehensive plan of care as evidenced by provider attestation and co-signature Inter-disciplinary care team collaboration (see longitudinal plan of care) Comprehensive medication review performed; medication list updated in electronic medical record  Hypertension / CKD Stage 3a (BP goal <130/80) -Controlled - BP at goal in recent clinic visits -Current home BP readings: SBP 120s-140s -Denies hypotensive/hypertensive symptoms -Current treatment: Metoprolol succinate 25 mg - 1/2 tab BID - Appropriate, Effective, Safe, Accessible -Medications previously tried: amlodipine, furosemide  -Current dietary habits: not discussed -Current exercise habits: limited -Educated on BP goals and benefits of medications for prevention of heart attack, stroke and kidney damage; Importance of home blood pressure monitoring; Symptoms of hypotension and importance of maintaining adequate hydration; -Counseled to monitor BP at home daily -Recommended to  continue current medication  Hyperlipidemia: (LDL goal < 100) -Not ideally controlled - LDL 123 (06/2021), which is the highest it has been in over a decade; pt endorses compliance with statin -Current treatment: Simvastatin 20 mg daily HS - Appropriate, Effective, Safe, Accessible -Medications previously tried: n/a  -Educated on Cholesterol goals; Benefits of statin for ASCVD risk reduction; -Consider optimizing statin therapy at follow up  COPD (Goal: control symptoms and prevent exacerbations) -Controlled - pt reports improved breathing while on Symbicort, now accessible through [employer] -Gold Grade: unable to assess -Current COPD Classification:  A (low sx, <2  exacerbations/yr) -Pulmonary function testing: 10/27/10- FEV1 1.54, FEV1/FVC 0.63, FEF 25-75% 0.84 -Exacerbations requiring treatment in last 6 months: 1 -Current treatment  Albuterol HFA -Appropriate, Effective, Safe, Accessible Symbicort 160-4.5 mcg/act 2 puff BID (PAP) -Appropriate, Effective, Safe, Accessible Duoneb PRN -Appropriate, Effective, Safe, Accessible -Medications previously tried: n/a  -Patient denies consistent use of maintenance inhaler -Frequency of rescue inhaler use: 1-2x daily -Counseled on Proper inhaler technique; Benefits of consistent maintenance inhaler use -Recommend to continue current medication  Bipolar disorder / Depression / Anxiety (Goal: manage symptoms) -Controlled - per patient report; she reports every time they try to change her meds "something goes wrong" and they change it back -PHQ9: 0 (06/2020) - minimal depression -GAD7: not on file -Connected with Dr Pearson Grippe for mental health support -Current treatment: Bupropion XL 150 mg daily -Appropriate, Effective, Safe, Accessible Buspirone 15 mg BID -Appropriate, Effective, Safe, Accessible Mirtazapine 30 mg HS -Appropriate, Effective, Safe, Accessible Sertraline 100 mg daily -Appropriate, Effective, Safe, Accessible Divalproex ER 500 mg daily HS -Appropriate, Effective, Safe, Accessible Lorazepam 1 mg HS prn -Appropriate, Effective, Safe, Accessible -Medications previously tried/failed: n/a -Educated on Benefits of medication for symptom control -Recommended to continue current medication  Osteoporosis screening (Goal prevent fractures) -Controlled - due for DEXA -Last DEXA Scan: 06/2017   T-Score femoral neck: -0.4  T-Score lumbar spine: +0.4  10-year probability of major osteoporotic fracture: n/a  10-year probability of hip fracture: n/a -Patient is not a candidate for pharmacologic treatment -Current treatment  Vitamin D 1000 IU - 2 daily - Appropriate, Effective, Safe,  Accessible -Medications previously tried: n/a  -Recommend weight-bearing and muscle strengthening exercises for building and maintaining bone density. -Recommend Repeat DEXA this year  Hypothyroidism (Goal: maintain TSH in goal range) -Controlled - TSH 3.86 (06/2021) at goal -Current treatment  Levothyroxine 50 mcg HS - Appropriate, Effective, Safe, Accessible -Medications previously tried: n/a  -Recommended to continue current medication  Anemia (Goal: maintain HGB) -Controlled- HGB 12.2 (06/2021) at goal; she is now off of iron after iron/HGB levels normalized -Current treatment  Vitamin B12 2000 mcg daily - Appropriate, Query Effective -Medications previously tried: Niferex -Recommend to continue current medication  OAB (Goal: manage symptoms) -Controlled - pt reports higher dose of Myrbetriq is working reasonably well -Current treatment  Myrbetriq ER 50 mg daily - Appropriate, Effective, Safe, Accessible -Medications previously tried: n/a  -Recommend to continue current medication  Rheumatoid Arthritis (Goal: manage pain) -Controlled- pt reports Enbrel is a "miracle drug" for her, pain is much improved and she is in process of tapering off prednisone -Follows with Dr Benjamine Mola [facility]) -Current treatment  Voltaren gel - Appropriate, Effective, Safe, Accessible Hydrocodone-APAP 5-325 mg TID PRN -Appropriate, Effective, Safe, Accessible Enbrel 50 mg weekly -Appropriate, Effective, Safe, Accessible Prednisone 10 mg daily - Appropriate, Effective, Safe, Accessible -Medications previously tried: plaquenil, methotrexate; neuropathy- baclofen, gabepntin, pregabalin -Recommended to continue current medication  Postmenopausal symptoms (  Goal: manage symptoms) -Not ideally controlled - pt stopped Premarin after last visit due to VTE risk; she has started supplements and reports they are not helping much with hot flashes but understands that it can take several months for  effect -Current treatment  Black Cohosh - Appropriate, Query Effective, Safe, Accessible Rhubarb/Estroven -Appropriate, Query Effective, Safe, Accessible -Medications previously tried: Premarin -Discussed risk of VTE "clot" with hormone replacement; 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; furthermore pt has not been able to tolerate anticoagulation so would consider her risk even higher for developing a clot  -Recommend to continue current medication  Query Neuropathy -Pt reports pins/needles and pain when the shower water hits her skin and she dries off; she has stopped showering due to this and takes sponge-baths instead; she reports washing her hands under running water does not cause any pain -Pt has messaged PCP and Rheumatologist about this issue and they suspect neuropathy; pt has not tolerated gabapentin (100 mg HS) or pregabalin (75 mg BID) in the past due to dizziness/"passing out" feeling; she has not tolerated duloxetine for same reason (dose unknown) -May consider trial of lower-dose pregabalin 25 mg, titrate dose gradually  Health Maintenance -Vaccine gaps: Shingrix #2 -Hx PE 11/2020, 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 Patient given website and phone number to apply for LIS (Extra Help)  Compliance/Adherence/Medication fill history: Care Gaps: Colonoscopy (due 05/25/20)  Star-Rating Drugs: Simvastatin -LF 05/14/21 X 90 DS --PDC inaccurate due to hospitalizations in Fall 2022  Medication Access: Within the past 30 days, how often has patient missed a dose of medication? 0 Is a pillbox or other  method used to improve adherence? Yes  Factors that may affect medication adherence? no barriers identified Are meds synced by current pharmacy? No  Are meds delivered by current pharmacy? No  Does patient experience delays in picking up medications due to transportation concerns? No   [facility] Reviewed: Is patient disadvantaged to use [facility]?: No  Current Rx insurance plan: [employer] [place] Name and location of Current pharmacy:  [id number] - [place], [place] [address] Phone: [phone] Fax: [phone]  [facility] services reviewed with patient today?: No  Patient requests to transfer care to [facility]?: No  Reason patient declined to change pharmacies: Loyalty to other pharmacy/Patient preference   Care Plan and Follow Up Patient Decision:  Patient agrees to Care Plan and Follow-up.  Plan: Telephone follow up appointment with care management team member scheduled for:  6 months  Charlene Brooke, PharmD, BCACP Clinical Pharmacist [facility] [phone]

## 2021-08-02 ENCOUNTER — Ambulatory Visit (INDEPENDENT_AMBULATORY_CARE_PROVIDER_SITE_OTHER): Payer: Medicare Other | Admitting: Family Medicine

## 2021-08-02 ENCOUNTER — Encounter: Payer: Self-pay | Admitting: Family Medicine

## 2021-08-02 VITALS — BP 150/72 | HR 67 | Temp 98.1°F | Ht 67.0 in | Wt 196.0 lb

## 2021-08-02 DIAGNOSIS — G609 Hereditary and idiopathic neuropathy, unspecified: Secondary | ICD-10-CM

## 2021-08-02 DIAGNOSIS — E538 Deficiency of other specified B group vitamins: Secondary | ICD-10-CM | POA: Diagnosis not present

## 2021-08-02 DIAGNOSIS — R7303 Prediabetes: Secondary | ICD-10-CM

## 2021-08-02 DIAGNOSIS — M0600 Rheumatoid arthritis without rheumatoid factor, unspecified site: Secondary | ICD-10-CM

## 2021-08-02 DIAGNOSIS — M7918 Myalgia, other site: Secondary | ICD-10-CM

## 2021-08-02 MED ORDER — PREGABALIN 25 MG PO CAPS: 25.0000 mg | ORAL_CAPSULE | Freq: Two times a day (BID) | ORAL | 1 refills | Status: DC

## 2021-08-02 NOTE — Assessment & Plan Note (Signed)
With chronic fatigue   No doubt worsening her neuropathy symptoms  Under psychiatry care   Today will try very low dose lyrica at 25 mg (intol of stronger) Cannot take cymbalta or tricyclics Under care for lumbar disc dz and RA as well

## 2021-08-02 NOTE — Progress Notes (Signed)
Subjective:    Patient ID: Lori AmberPhyllis J Orozco, female    DOB: 1950-06-18, 71 y.o.   MRN: [id number]  HPI Pt presents for skin hypersensitivity /? Neuropathy   Wt Readings from Last 3 Encounters:  08/02/21 196 lb (88.9 kg)  07/11/21 186 lb 9.6 oz (84.6 kg)  07/10/21 170 lb (77.1 kg)   30.70 kg/m   Pain /feeling like pins/needles when showering Arms and hands  Very sensitive to the touch  No redness or swelling   No redness or swelling   Had a h/o neuropathy in foot/feet in the past  Saw Dr Royetta CrochetPate , had some tests done at [facility] (no source known)  Put her on B12 and it helped Now bottom of feet feel like she is walking on balloons- recently worse and feels like "wires"     Had to change to sponge baths   Rheumatologist suspects neuropathy  Had not tolerated gabapentin 100 or pregabalin 75 mg in the past   Pharmacist sugg pregamalin 25 mg to poss titrate gradually  Lab Results  Component Value Date   HGBA1C 5.4 07/11/2021   Lab Results  Component Value Date   ESRSEDRATE 19 07/05/2021   Lab Results  Component Value Date   VITAMINB12 298 08/05/2009   Take 2000 mcg of B12 daily     From psychiatry  Buspar Zoloft  Wellbutrin  Ativan Depakote   Cymbalta - passed out  Tricyclics- allergy    No recent medicine changes   4 epidurals in spine last week  Dr Alvester [place]  Did not work   Patient Active Problem List   Diagnosis Date Noted   Vitamin B12 deficiency 08/02/2021   Myofascial pain dysfunction syndrome 08/02/2021   Adverse effect of prednisone 07/11/2021   Prediabetes 07/11/2021   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   History of pulmonary embolism 12/08/2020   Arthritis of knee 12/06/2020   Bilateral knee pain 09/20/2020   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   High risk medication use 09/20/2020   Extensor tenosynovitis of right wrist 09/15/2020   S/P reverse total shoulder arthroplasty, 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   Lung nodule  01/02/2020   COVID-19 03/31/2019   Status post arthroscopy of left shoulder 01/16/2018   Estrogen deficiency 06/28/2017   Medial epicondylitis, left elbow 04/29/2017   S/P arthroscopy of left shoulder 02/25/2017   Impingement syndrome of left shoulder 01/30/2017   Pedal edema 10/26/2016   Trochanteric bursitis, right hip 04/10/2016   Essential hypertension 09/02/2015   Urge incontinence 07/27/2015   Obesity 04/28/2015   Screening for HIV (human immunodeficiency virus) 04/26/2015   Osteoarthritis of left knee 01/28/2015   Status post total left knee replacement 01/28/2015   Vitamin D deficiency 04/27/2014   Seasonal and perennial allergic rhinitis 06/17/2013   S/P total hip arthroplasty 03/20/2013   Medicare annual wellness visit, subsequent 11/18/2012   History of falling 11/18/2012   Routine general medical examination at a health care facility 11/10/2012   Lichen sclerosus et atrophicus of the vulva 07/29/2012   Osteopenia 10/19/2011   Hypothyroidism 10/19/2011   ANEMIA 10/11/2009   Hereditary and idiopathic peripheral neuropathy 08/05/2009   HAND PAIN, BILATERAL 08/05/2009   TREMOR 03/09/2008   MENOPAUSAL SYNDROME 10/09/2007   DEPRESSION, MAJOR 05/20/2007   BIPOLAR AFFECTIVE DISORDER 05/20/2007   Generalized anxiety disorder 05/20/2007   PERSONALITY DISORDER 05/20/2007   ESOPHAGEAL SPASM 05/20/2007   Diaphragmatic hernia 05/20/2007   AMAUROSIS FUGAX 05/07/2007   COPD mixed type (HCC) 03/27/2007   HYPERCHOLESTEROLEMIA, PURE  12/10/2006   SYMPTOM, SYNDROME, CHRONIC FATIGUE 12/10/2006   Past Medical History:  Diagnosis Date   Amaurosis fugax    Anemia    hx   Anxiety    Arthritis    Asthma    Cataract    Chronic fatigue syndrome    Chronic kidney disease    frequency, nephritis when 71 yrs old   COPD (chronic obstructive pulmonary disease) (HCC)    COVID-19    Depression    Diverticulitis    Emphysema of lung (HCC)    Fibromyalgia    GERD (gastroesophageal  reflux disease)    occ   H/O hiatal hernia    History of bronchitis    Hyperlipidemia    Hypothyroidism    Interstitial cystitis    Irritable bowel syndrome    Lichen sclerosus    Lumbar herniated disc    Migraines    Pneumonia    PONV (postoperative nausea and vomiting)    Shortness of breath    exertion    Thyroid disease    Graves   Urinary frequency    Urinary tract infection    Vertigo    Past Surgical History:  Procedure Laterality Date   ABDOMINAL HYSTERECTOMY     bladder sugery      CAROTID DOPPLAR     COLONOSCOPY  05/26/2010   avms- otherwise nl , re check 10y   DEXA-OSTEOPENIA     DOPPLER ECHOCARDIOGRAPHY     elbow surgery     EPICONDYLITIS     EYE SURGERY Bilateral    cataracts   FOOT SURGERY Bilateral    I & D EXTREMITY Right 09/01/2020   Procedure: TYNOSYNOVECTOMY;  Surgeon: [name], MD;  Location: [facility] OR;  Service: Orthopedics;  Laterality: Right;   KNEE SURGERY     Left cartilage   lipoma in second finger right hand     PLANTAR FASCIA SURGERY Left    REVERSE SHOULDER ARTHROPLASTY Left 02/02/2020   Procedure: LEFT REVERSE SHOULDER ARTHROPLASTY;  Surgeon: [name], MD;  Location[facility];  Service: Orthopedics;  Laterality: Left;   ROTATOR CUFF REPAIR Left 12/2017   TEMPOROMANDIBULAR JOINT SURGERY     TONSILLECTOMY     TOTAL HIP ARTHROPLASTY Right 03/20/2013   Procedure: Right TOTAL HIP ARTHROPLASTY;  Surgeon: [name], MD;  Location: [facility] OR;  Service: Orthopedics;  Laterality: Right;  Right Total Hip Arthroplasty   TOTAL KNEE ARTHROPLASTY Left 01/28/2015   Procedure: LEFT TOTAL KNEE ARTHROPLASTY;  Surgeon: [name], MD;  Location: [facility] OR[facility];  Service: Orthopedics;  Laterality: Left;   TRIGGER FINGER RELEASE Right    TROCHANTERIC BURSA EXCISION Right 05/03/2016   Dr. [name], [name]   WRIST ARTHROSCOPY Right    ligament tear   Social History   Tobacco Use   Smoking status: Former    Packs/day: 2.00     Years: 40.00    Pack years: 80.00    Types: E-cigarettes, Cigarettes    Quit date: 2015    Years since quitting: 8.4   Smokeless tobacco: Never  Vaping Use   Vaping Use: Former   Quit date: 11/26/2020   Devices: no nicotine in it  Substance Use Topics   Alcohol use: No    Alcohol/week: 0.0 standard drinks   Drug use: No   Family History  Problem Relation Age of Onset   Arthritis Mother    Hypertension Mother    Kidney disease Mother    Cancer Father  Bladder   Arthritis Sister    Colon cancer Other    Cancer - Lung Cousin    Arthritis Maternal Grandmother    Allergies  Allergen Reactions   Lithium Anaphylaxis   Tegretol [Carbamazepine] Other (See Comments)    Fever and body aches (fever over 103)   Tricyclic Antidepressants Anaphylaxis    Other reaction(s): Unknown   Methotrexate Derivatives Other (See Comments)    Urinary retention and dizziness  Other reaction(s): Other UTI   Strawberry Extract Hives   Codeine Nausea Only    Makes pt stay awake   Cymbalta [Duloxetine Hcl] Other (See Comments)    Makes pt pass out    Erythromycin     abdominal pain   Lyrica [Pregabalin]     Felt faint   Neurontin [Gabapentin]     Passes  out   Rabeprazole Sodium     insomnia   Duraprep [Antiseptic Products, Misc.] Itching and Rash    Tolerates Betadine    Penicillins Rash    Tolerated ANCEF 02/02/20  Has patient had a PCN reaction causing immediate rash, facial/tongue/throat swelling, SOB or lightheadedness with hypotension: Yes Has patient had a PCN reaction causing severe rash involving mucus membranes or skin necrosis: No Has patient had a PCN reaction that required hospitalization No Has patient had a PCN reaction occurring within the last 10 years: No If all of the above answers are "NO", then may proceed with Cephalosporin use.    Tape Rash    PT ALLERGIC NYLON TAPE    Current Outpatient Medications on File Prior to Visit  Medication Sig Dispense Refill    albuterol (VENTOLIN HFA) 108 (90 Base) MCG/ACT inhaler Inhale 1-2 puffs into the lungs every 4 (four) hours as needed for wheezing or shortness of breath. 1 each 0   Black Cohosh 40 MG CAPS Take 1 capsule (40 mg total) by mouth daily. 30 capsule 11   budesonide-formoterol (SYMBICORT) 160-4.5 MCG/ACT inhaler Inhale 2 puffs into the lungs 2 (two) times daily. 3 each 3   buPROPion (WELLBUTRIN XL) 150 MG 24 hr tablet Take 150 mg by mouth daily.     busPIRone (BUSPAR) 15 MG tablet Take 15 mg by mouth 2 (two) times daily before a meal.     cholecalciferol (VITAMIN D) 25 MCG (1000 UNIT) tablet Take 2,000 Units by mouth daily. 2 tablets daily     clobetasol cream (TEMOVATE) 0.05 % APPLY SMALL AMOUNT TO AFFECTED AREAS TWO TO THREE TIMES PER WEEK 30 g 4   cyanocobalamin 2000 MCG tablet Take 1,000 mcg by mouth daily.     diclofenac Sodium (VOLTAREN) 1 % GEL Apply 2 g topically 2 (two) times daily as needed (pain).     diphenhydrAMINE (BENADRYL) 25 MG tablet Take 25 mg by mouth every 6 (six) hours as needed for allergies.     divalproex (DEPAKOTE ER) 500 MG 24 hr tablet Take 500 mg by mouth at bedtime.      etanercept (ENBREL SURECLICK) 50 MG/ML injection Inject 50 mg into the skin once a week. 12 mL 0   fluticasone (FLONASE) 50 MCG/ACT nasal spray PLACE 2 SPRAYS INTO THE NOSE DAILY. (Patient taking differently: Place 2 sprays into both nostrils. PLACE 2 SPRAYS INTO THE NOSE DAILY.) 16 g 11   HYDROcodone-acetaminophen (NORCO/VICODIN) 5-325 MG tablet Take 1 tablet by mouth 3 (three) times daily as needed for moderate pain. 30 tablet 0   ipratropium-albuterol (DUONEB) 0.5-2.5 (3) MG/3ML SOLN Take 3 mLs by nebulization every  6 (six) hours as needed. 120 mL prn   ketotifen (ZADITOR) 0.025 % ophthalmic solution Place 1 drop into both eyes 2 (two) times daily as needed (allergies).     levothyroxine (SYNTHROID) 50 MCG tablet TAKE ONE TABLET BY MOUTH DAILY BEFORE BREAKFAST (Patient taking differently: Take 50 mcg by  mouth at bedtime.) 90 tablet 3   LORazepam (ATIVAN) 1 MG tablet Take 1 mg by mouth at bedtime as needed for anxiety or sleep.     metoprolol succinate (TOPROL-XL) 25 MG 24 hr tablet Take 0.5 tablets (12.5 mg total) by mouth in the morning and at bedtime. 90 tablet 3   mirabegron ER (MYRBETRIQ) 50 MG TB24 tablet Take 1 tablet (50 mg total) by mouth daily. 190 tablet 3   mirtazapine (REMERON) 30 MG tablet Take 30 mg by mouth at bedtime.      ondansetron (ZOFRAN-ODT) 4 MG disintegrating tablet Take 1 tablet (4 mg total) by mouth every 8 (eight) hours as needed for nausea or vomiting. 20 tablet 1   predniSONE (DELTASONE) 10 MG tablet Take by mouth once daily as directed 45 tablet 1   Rhubarb (ESTROVEN COMPLETE) 4 MG TABS Take 4 mg by mouth daily. 30 tablet 11   sertraline (ZOLOFT) 100 MG tablet Take 100 mg by mouth daily.      simvastatin (ZOCOR) 20 MG tablet TAKE ONE TABLET BY MOUTH EVERY NIGHT AT BEDTIME 90 tablet 3   Spacer/Aero-Holding Chambers (AEROCHAMBER PLUS) inhaler Use with inhaler 1 each 2   Current Facility-Administered Medications on File Prior to Visit  Medication Dose Route Frequency Provider Last Rate Last Admin   bupivacaine (MARCAINE) 0.5 % (with pres) injection 3 mL  3 mL Other Once Tyrell Antonio, MD        Review of Systems  Constitutional:  Negative for activity change, appetite change, fatigue, fever and unexpected weight change.  HENT:  Negative for congestion, ear pain, rhinorrhea, sinus pressure and sore throat.   Eyes:  Negative for pain, redness and visual disturbance.  Respiratory:  Negative for cough, shortness of breath and wheezing.   Cardiovascular:  Negative for chest pain and palpitations.  Gastrointestinal:  Negative for abdominal pain, blood in stool, constipation and diarrhea.  Endocrine: Negative for polydipsia and polyuria.  Genitourinary:  Negative for dysuria, frequency and urgency.  Musculoskeletal:  Positive for arthralgias, back pain, gait problem,  joint swelling, myalgias and neck pain.  Skin:  Negative for pallor and rash.  Allergic/Immunologic: Negative for environmental allergies.  Neurological:  Positive for tremors and numbness. Negative for dizziness, syncope and headaches.  Hematological:  Negative for adenopathy. Does not bruise/bleed easily.  Psychiatric/Behavioral:  Negative for decreased concentration and dysphoric mood. The patient is not nervous/anxious.       Objective:   Physical Exam Constitutional:      General: She is not in acute distress.    Appearance: Normal appearance. She is well-developed. She is obese. She is not ill-appearing or diaphoretic.  HENT:     Head: Normocephalic and atraumatic.  Eyes:     Conjunctiva/sclera: Conjunctivae normal.     Pupils: Pupils are equal, round, and reactive to light.  Neck:     Thyroid: No thyromegaly.     Vascular: No carotid bruit or JVD.  Cardiovascular:     Rate and Rhythm: Normal rate and regular rhythm.     Heart sounds: Normal heart sounds.    No gallop.  Pulmonary:     Effort: Pulmonary effort is normal. No  respiratory distress.     Breath sounds: Normal breath sounds. No wheezing or rales.  Abdominal:     General: There is no distension or abdominal bruit.     Palpations: Abdomen is soft.  Musculoskeletal:     Cervical back: Normal range of motion and neck supple.     Right lower leg: No edema.     Left lower leg: No edema.  Lymphadenopathy:     Cervical: No cervical adenopathy.  Skin:    General: Skin is warm and dry.     Coloration: Skin is not jaundiced or pale.     Findings: No bruising, erythema or rash.  Neurological:     Mental Status: She is alert.     Cranial Nerves: Cranial nerves 2-12 are intact. No cranial nerve deficit, dysarthria or facial asymmetry.     Sensory: Sensation is intact.     Motor: Tremor present. No weakness, atrophy, abnormal muscle tone, seizure activity or pronator drift.     Coordination: Coordination is intact.  Coordination normal.     Deep Tendon Reflexes: Reflexes are normal and symmetric. Reflexes normal.     Comments: Arms and dorsal hands are hypersensitive  Due to RA pain -grip is not full / does not seem to have focal weakness however   Some decreased sens to soft touch on plantar surface of feet  Wide based gait with standing walker   Psychiatric:        Attention and Perception: Attention normal.        Mood and Affect: Mood normal.        Cognition and Memory: Cognition and memory normal.     Comments: Pleasant           Assessment & Plan:   Problem List Items Addressed This Visit       Nervous and Auditory   Hereditary and idiopathic peripheral neuropathy - Primary    Suspect the hypersensitivity in arms/hands and feet is from neuropathy (most likely linked to RA) Discussed tx options : has not bee able to tolerate gabapentin 100 or pregablalin 75 or cymbalta or tricyclics in the past  at adv of pharmacy-will try pregabalin 25 mg daily (then bid if tol) to titer up later if able  Will avoid hot water/pressure  Check skin regularly  B12 level checked today  Last a1c below dm range        Relevant Medications   pregabalin (LYRICA) 25 MG capsule     Musculoskeletal and Integument   Myofascial pain dysfunction syndrome    With chronic fatigue   No doubt worsening her neuropathy symptoms  Under psychiatry care   Today will try very low dose lyrica at 25 mg (intol of stronger) Cannot take cymbalta or tricyclics Under care for lumbar disc dz and RA as well       Relevant Medications   pregabalin (LYRICA) 25 MG capsule   Seronegative rheumatoid arthritis (HCC)    Under rheum care  Suspect neuropathy is secondary Making slow progress  enbrel  Prednisone           Other   Prediabetes   Vitamin B12 deficiency   Relevant Orders   Vitamin B12

## 2021-08-02 NOTE — Assessment & Plan Note (Addendum)
Under rheum care  Suspect neuropathy is secondary Making slow progress  enbrel  Prednisone

## 2021-08-02 NOTE — Patient Instructions (Signed)
I think your pain is from neuropathy and also fibromyalgia   Let's check your B12 level   Try the generic lyrica 25 mg daily -if you tolerate it well and it is not too sedating go up to twice daily and let me know    We can consider a neurologist later on  Keep us posted

## 2021-08-02 NOTE — Assessment & Plan Note (Signed)
Suspect the hypersensitivity in arms/hands and feet is from neuropathy (most likely linked to RA) Discussed tx options : has not bee able to tolerate gabapentin 100 or pregablalin 75 or cymbalta or tricyclics in the past  at adv of pharmacy-will try pregabalin 25 mg daily (then bid if tol) to titer up later if able  Will avoid hot water/pressure  Check skin regularly  B12 level checked today  Last a1c below dm range

## 2021-08-03 LAB — VITAMIN B12: Vitamin B-12: >1504 pg/mL — ABNORMAL HIGH (ref 211–911)

## 2021-08-04 ENCOUNTER — Telehealth: Payer: Self-pay

## 2021-08-04 NOTE — Telephone Encounter (Signed)
-----   Message from [name], MD sent at 08/03/2021  4:45 PM EDT ----- Your vitamin B12 level is actually high instead of low  You can cut your dose in 1/2 or take it every other day

## 2021-08-04 NOTE — Telephone Encounter (Signed)
I left a message for the patient to return my call.

## 2021-08-04 NOTE — Telephone Encounter (Signed)
Patient is returning a call about lab results.  

## 2021-08-04 NOTE — Telephone Encounter (Signed)
Spoke with pt regarding lab results. 

## 2021-08-07 ENCOUNTER — Telehealth: Payer: Self-pay | Admitting: Orthopaedic Surgery

## 2021-08-07 ENCOUNTER — Other Ambulatory Visit: Payer: Self-pay | Admitting: Orthopaedic Surgery

## 2021-08-07 MED ORDER — HYDROCODONE-ACETAMINOPHEN 5-325 MG PO TABS: 1.0000 | ORAL_TABLET | Freq: Three times a day (TID) | ORAL | 0 refills | Status: DC | PRN

## 2021-08-07 NOTE — Telephone Encounter (Signed)
Patient called needing Rx refilled Hydrocodone. The number to contact patient is [phone] 

## 2021-08-07 NOTE — Telephone Encounter (Signed)
Please advise 

## 2021-08-08 ENCOUNTER — Other Ambulatory Visit: Payer: Self-pay | Admitting: Family Medicine

## 2021-08-08 ENCOUNTER — Other Ambulatory Visit: Payer: Self-pay

## 2021-08-08 DIAGNOSIS — M1711 Unilateral primary osteoarthritis, right knee: Secondary | ICD-10-CM

## 2021-08-14 ENCOUNTER — Ambulatory Visit: Payer: Medicare Other | Admitting: Physician Assistant

## 2021-08-14 ENCOUNTER — Ambulatory Visit (INDEPENDENT_AMBULATORY_CARE_PROVIDER_SITE_OTHER): Payer: Medicare Other | Admitting: Physician Assistant

## 2021-08-14 ENCOUNTER — Encounter: Payer: Self-pay | Admitting: Physician Assistant

## 2021-08-14 DIAGNOSIS — M1711 Unilateral primary osteoarthritis, right knee: Secondary | ICD-10-CM

## 2021-08-14 MED ORDER — HYALURONAN 88 MG/4ML IX SOSY
88.0000 mg | PREFILLED_SYRINGE | INTRA_ARTICULAR | Status: AC | PRN
  Administered 2021-08-14: 88 mg via INTRA_ARTICULAR

## 2021-08-14 NOTE — Progress Notes (Signed)
   Procedure Note  Patient: Lori Orozco             Date of Birth: 05/28/1950           MRN: [id number]             Visit Date: 08/14/2021  HPI: [name] comes in today for scheduled right knee Monovisc injection.  She is try conservative treatment with cortisone injection and hinged knee brace.  She is diagnosed with rheumatoid arthritis seronegative and is on Enbrel.  She also notes that being on prednisone she has gained some 50 pounds.  States that her knee brace no longer fits her.  She has known arthritis of her right knee.  She has no upcoming surgery for the right knee scheduled in the next 6 months.  Review of systems: See HPI  Physical exam: Right knee good range of motion.  Significant crepitus with passive range of motion no instability valgus varus stressing.  Tenderness along medial joint line.  No abnormal warmth erythema or effusion. Procedures: Visit Diagnoses:  1. Primary osteoarthritis of right knee     Large Joint Inj: R knee on 08/14/2021 3:41 PM Indications: pain Details: 22 G 1.5 in needle, anterolateral approach  Arthrogram: No  Medications: 88 mg Hyaluronan 88 MG/4ML Outcome: tolerated well, no immediate complications Procedure, treatment alternatives, risks and benefits explained, specific risks discussed. Consent was given by the patient. Immediately prior to procedure a time out was called to verify the correct patient, procedure, equipment, support staff and site/side marked as required. Patient was prepped and draped in the usual sterile fashion.    Plan: She will follow-up with [place] as needed she knows to wait at least 6 months between supplemental injections.  Her knee brace was placed on her knee and fits well.  Questions were encouraged and answered at length

## 2021-08-15 NOTE — Procedures (Signed)
Lumbar Diagnostic Facet Joint Nerve Block with Fluoroscopic Guidance   Patient: Lori Orozco      Date of Birth: 10-06-1950 MRN: [id number] PCP: [name], MD      Visit Date: 07/27/2021   Universal Protocol:    Date/Time: 06/20/236:26 PM  Consent Given By: the patient  Position: PRONE  Additional Comments: Vital signs were monitored before and after the procedure. Patient was prepped and draped in the usual sterile fashion. The correct patient, procedure, and site was verified.   Injection Procedure Details:   Procedure diagnoses:  1. Spondylosis without myelopathy or radiculopathy, lumbar region      Meds Administered:  Meds ordered this encounter  Medications   bupivacaine (MARCAINE) 0.5 % (with pres) injection 3 mL     Laterality: Bilateral  Location/Site: L4-L5, L3 and L4 medial branches and L5-S1, L4 medial branch and L5 dorsal ramus  Needle: 5.0 in., 25 ga.  Short bevel or Quincke spinal needle  Needle Placement: Oblique pedical  Findings:   -Comments: There was excellent flow of contrast along the articular pillars without intravascular flow.  Procedure Details: The fluoroscope beam is vertically oriented in AP and then obliqued 15 to 20 degrees to the ipsilateral side of the desired nerve to achieve the "Scotty dog" appearance.  The skin over the target area of the junction of the superior articulating process and the transverse process (sacral ala if blocking the L5 dorsal rami) was locally anesthetized with a 1 ml volume of 1% Lidocaine without Epinephrine.  The spinal needle was inserted and advanced in a trajectory view down to the target.   After contact with periosteum and negative aspirate for blood and CSF, correct placement without intravascular or epidural spread was confirmed by injecting 0.5 ml. of Isovue-250.  A spot radiograph was obtained of this image.    Next, a 0.5 ml. volume of the injectate described above was injected. The  needle was then redirected to the other facet joint nerves mentioned above if needed.  Prior to the procedure, the patient was given a Pain Diary which was completed for baseline measurements.  After the procedure, the patient rated their pain every 30 minutes and will continue rating at this frequency for a total of 5 hours.  The patient has been asked to complete the Diary and return to [place] by mail, fax or hand delivered as soon as possible.   Additional Comments:  No complications occurred Dressing: 2 x 2 sterile gauze and Band-Aid    Post-procedure details: Patient was observed during the procedure. Post-procedure instructions were reviewed.  Patient left the clinic in stable condition.

## 2021-08-15 NOTE — Progress Notes (Signed)
Lori Orozco - 71 y.o. female MRN [id number]  Date of birth: Jun 29, 1950  Office Visit Note: Visit Date: 07/27/2021 PCP: [name], MD Referred by: Tower, Audrie Gallus, MD  Subjective: Chief Complaint  Patient presents with   Lower Back - Pain   HPI:  Lori Orozco is a 70 y.o. female who comes in today for planned repeat Bilateral L4-5 and L5-S1 Lumbar facet/medial branch block with fluoroscopic guidance.  The patient has failed conservative care including home exercise, medications, time and activity modification.  This injection will be diagnostic and hopefully therapeutic.  Please see requesting physician notes for further details and justification.  Exam shows concordant low back pain with facet joint loading and extension. Patient received more than 80% pain relief from prior injection. This would be the second block in a diagnostic double block paradigm.  Unfortunately, the patient has multiple medical issues and the last injection was in April of last year and since that time she has had 2 significant hospitalizations for COPD exacerbation and kidney disease.  She is followed by her primary care physician Dr. Milinda Antis.  She continues to be followed by Dr. [name] for osteoarthritis of her knees.  She also sees Dr. Sheliah Hatch in rheumatology for seronegative rheumatoid arthritis as well as history of fibromyalgia and chronic pain syndrome.  She has had an updated MRI since I last saw her that does show mainly facet arthropathy with some level of edema around the para facet joint area and degenerative disc changes but no high-grade central stenosis or nerve compression.  I think it is wise to go ahead and complete the second course of diagnostic facet blocks and just see how much relief she gets if she does well we could look at radiofrequency ablation.  If she does not get much relief could look at one-time epidural injection potentially.  She may unfortunately be a better  candidate for chronic medication pain management.     Referring:Dr. Doneen Poisson   ROS Otherwise per HPI.  Assessment & Plan: Visit Diagnoses:    ICD-10-CM   1. Spondylosis without myelopathy or radiculopathy, lumbar region  M47.816 XR C-ARM NO REPORT    Facet Injection    bupivacaine (MARCAINE) 0.5 % (with pres) injection 3 mL      Plan: No additional findings.   Meds & Orders:  Meds ordered this encounter  Medications   bupivacaine (MARCAINE) 0.5 % (with pres) injection 3 mL    Orders Placed This Encounter  Procedures   Facet Injection   XR C-ARM NO REPORT    Follow-up: Return for Review Pain Diary.   Procedures: No procedures performed  Lumbar Diagnostic Facet Joint Nerve Block with Fluoroscopic Guidance   Patient: Lori Orozco      Date of Birth: 1950/12/30 MRN: [id number] PCP: [name], MD      Visit Date: 07/27/2021   Universal Protocol:    Date/Time: 06/20/236:26 PM  Consent Given By: the patient  Position: PRONE  Additional Comments: Vital signs were monitored before and after the procedure. Patient was prepped and draped in the usual sterile fashion. The correct patient, procedure, and site was verified.   Injection Procedure Details:   Procedure diagnoses:  1. Spondylosis without myelopathy or radiculopathy, lumbar region      Meds Administered:  Meds ordered this encounter  Medications   bupivacaine (MARCAINE) 0.5 % (with pres) injection 3 mL     Laterality: Bilateral  Location/Site: L4-L5, L3 and  L4 medial branches and L5-S1, L4 medial branch and L5 dorsal ramus  Needle: 5.0 in., 25 ga.  Short bevel or Quincke spinal needle  Needle Placement: Oblique pedical  Findings:   -Comments: There was excellent flow of contrast along the articular pillars without intravascular flow.  Procedure Details: The fluoroscope beam is vertically oriented in AP and then obliqued 15 to 20 degrees to the ipsilateral side of the  desired nerve to achieve the "Scotty dog" appearance.  The skin over the target area of the junction of the superior articulating process and the transverse process (sacral ala if blocking the L5 dorsal rami) was locally anesthetized with a 1 ml volume of 1% Lidocaine without Epinephrine.  The spinal needle was inserted and advanced in a trajectory view down to the target.   After contact with periosteum and negative aspirate for blood and CSF, correct placement without intravascular or epidural spread was confirmed by injecting 0.5 ml. of Isovue-250.  A spot radiograph was obtained of this image.    Next, a 0.5 ml. volume of the injectate described above was injected. The needle was then redirected to the other facet joint nerves mentioned above if needed.  Prior to the procedure, the patient was given a Pain Diary which was completed for baseline measurements.  After the procedure, the patient rated their pain every 30 minutes and will continue rating at this frequency for a total of 5 hours.  The patient has been asked to complete the Diary and return to [place] by mail, fax or hand delivered as soon as possible.   Additional Comments:  No complications occurred Dressing: 2 x 2 sterile gauze and Band-Aid    Post-procedure details: Patient was observed during the procedure. Post-procedure instructions were reviewed.  Patient left the clinic in stable condition.   Clinical History: MR SPINE LUMBAR WWO CONTRAST, 02/14/2021 4:06 PM   INDICATION: Myelopathy, acute, lumbar spine \ Of note - there is a MRI L spine in care everywhere to compare \ R53.1 Weakness  COMPARISON: Outside noncontrast MRI lumbar spine 12/09/2020 ([facility]), images not available for comparison.   TECHNIQUE: Multiplanar, multisequence surface-coil magnetic resonance imaging of the lumbar spine was performed before and after intravenous administration of gadolinium-based contrast media. Images were acquired from the levels  of the lower thoracic spine to the upper sacral region.   FINDINGS:   . Alignment: Mild broad-based dextrocurvature of the lumbar spine.  . Vertebrae: Vertebral body heights are maintained. No diffuse abnormal marrow signal. Mixed edema and fatty signal at the L4-L5 endplates. Mild left greater than right increased STIR signal within the L1-L4 facets which can be seen with chronic stress due to altered biomechanics or degenerative changes, most notable about the left L3-L4 facet (series 14 image 26) with adjacent soft tissue edema and enhancement.  . Conus medullaris: The conus terminates at the level of L1. Normal signal and contour.  Marland Kitchen Upper sacrum: No focal lesion.   . Degenerative changes:       T12-L1: No significant disc height loss. No significant narrowing of the spinal canal or neural foramina.       L1-L2: Minimal disc height loss desiccation. Minimal left eccentric disc bulge with mild endplate spurring which narrow the left lateral recess without substantial canal or foraminal stenosis.       L2-L3: No significant disc height loss. No significant narrowing of the spinal canal or neural foramina. Trace bilateral facet joint effusions with mild facet arthropathy.  L3-L4: No significant disc height loss. Broad-based disc bulge and facet hypertrophy contribute to mild bilateral foraminal stenosis. Trace right facet joint effusion. Soft tissue edema surrounding the left facet as described above.       L4-L5: Moderate to advanced disc height loss and desiccation. Minimal broad-based disc bulge with facet arthropathy contributing to mild narrowing of the bilateral lateral recesses with mild bilateral foraminal stenosis.       L5-S1: No significant disc height loss. Mild broad-based disc bulge and minimal marginal osteophyte spurring contribute to mild lateral recess narrowing with mild right greater than left foraminal stenosis.   Additional comments: Marked lower paraspinal musculature  and psoas sarcopenia. No paraspinal fluid collections seen.   CONCLUSION:   1.  No abnormal cord signal or high grade canal or foraminal stenosis.  2.  Mild edema changes throughout the left greater than right L1-L4 facets, most notably at the L3-L4 facets with surrounding soft tissue edema and enhancement. Findings are presumed secondary to inflammation from degenerative changes rather than an infectious etiology. No fluid collection to suggest abscess. If there is ongoing concern for infection in the lumbar spine, short-term interval follow up imaging could reassess.  3.  Mixed fat and edema signal at the L4-L5 endplates likely secondary to degenerative disc disease.  Exam End: 02/14/21     Objective:  VS:  HT:    WT:   BMI:     BP:(!) 154/75  HR:74bpm  TEMP: ( )  RESP:  Physical Exam Vitals and nursing note reviewed.  Constitutional:      General: She is not in acute distress.    Appearance: Normal appearance. She is not ill-appearing.  HENT:     Head: Normocephalic and atraumatic.     Right Ear: External ear normal.     Left Ear: External ear normal.  Eyes:     Extraocular Movements: Extraocular movements intact.  Cardiovascular:     Rate and Rhythm: Normal rate.     Pulses: Normal pulses.  Pulmonary:     Effort: Pulmonary effort is normal. No respiratory distress.  Abdominal:     General: There is no distension.     Palpations: Abdomen is soft.  Musculoskeletal:        General: Tenderness present.     Cervical back: Neck supple.     Right lower leg: No edema.     Left lower leg: No edema.     Comments: Patient has good distal strength with no pain over the greater trochanters.  No clonus or focal weakness. Patient somewhat slow to rise from a seated position to full extension.  There is concordant low back pain with facet loading and lumbar spine extension rotation.  There are no definitive trigger points but the patient is somewhat tender across the lower back and  PSIS.  There is no pain with hip rotation.   Skin:    Findings: No erythema, lesion or rash.  Neurological:     General: No focal deficit present.     Mental Status: She is alert and oriented to person, place, and time.     Sensory: No sensory deficit.     Motor: No weakness or abnormal muscle tone.     Coordination: Coordination normal.  Psychiatric:        Mood and Affect: Mood normal.        Behavior: Behavior normal.      Imaging: No results found.

## 2021-08-16 ENCOUNTER — Encounter: Payer: Self-pay | Admitting: Family Medicine

## 2021-08-17 ENCOUNTER — Ambulatory Visit (INDEPENDENT_AMBULATORY_CARE_PROVIDER_SITE_OTHER): Payer: Medicare Other | Admitting: Physical Medicine and Rehabilitation

## 2021-08-17 ENCOUNTER — Encounter: Payer: Self-pay | Admitting: Physical Medicine and Rehabilitation

## 2021-08-17 VITALS — BP 162/79 | HR 74

## 2021-08-17 DIAGNOSIS — M545 Low back pain, unspecified: Secondary | ICD-10-CM

## 2021-08-17 DIAGNOSIS — R269 Unspecified abnormalities of gait and mobility: Secondary | ICD-10-CM

## 2021-08-17 DIAGNOSIS — G8929 Other chronic pain: Secondary | ICD-10-CM

## 2021-08-17 DIAGNOSIS — M47816 Spondylosis without myelopathy or radiculopathy, lumbar region: Secondary | ICD-10-CM

## 2021-08-17 DIAGNOSIS — G894 Chronic pain syndrome: Secondary | ICD-10-CM | POA: Diagnosis not present

## 2021-08-17 NOTE — Progress Notes (Unsigned)
Pt state lower back pain. Pt state standing makes the pain worse. Pt state she takes pain meds to help ease her pain. Pt has hx on inj on 07/27/21 pt state the inj didn't help.  Pt also state she is having tingling in both arms and hands.  Numeric Pain Rating Scale and Functional Assessment Average Pain 10 Pain Right Now 9 My pain is intermittent, constant, sharp, burning, dull, tingling, and aching Pain is worse with: standing and some activites Pain improves with: medication and injections   In the last MONTH (on 0-10 scale) has pain interfered with the following?  1. General activity like being  able to carry out your everyday physical activities such as walking, climbing stairs, carrying groceries, or moving a chair?  Rating(5)  2. Relation with others like being able to carry out your usual social activities and roles such as  activities at home, at work and in your community. Rating(6)  3. Enjoyment of life such that you have  been bothered by emotional problems such as feeling anxious, depressed or irritable?  Rating(7)

## 2021-08-17 NOTE — Progress Notes (Deleted)
Lori Orozco - 71 y.o. female MRN [id number]  Date of birth: [birth year]  Office Visit Note: Visit Date: 08/17/2021 PCP: Abner Greenspan, MD Referred by: Tower, Wynelle Fanny, MD  Subjective: Chief Complaint  Patient presents with   Lower Back - Pain   HPI: Lori Orozco is a 71 y.o. female who comes in today for evaluation of chronic, worsening and severe bilateral lower back pain. Patient reports pain has been ongoing for several years and is exacerbated by sitting, standing and walking. States her pain worsens with any type of movement and activity. She describes her discomfort as constant sore and aching sensation, currently rates as 8 out of 10 at present. Patient reports some relief of pain with rest and Norco for moderate/severe discomfort. Patient has recently completed a regimen of in home OT/PT, states she is working to increase overall strength and mobility due to recent illness. Patient's lumbar MRI from 2022 exhibits mild edema changes throughout the left greater than right L1-L4 facets, most notably at the L3-L4 facets with surrounding soft tissue edema and enhancement. No high grade spinal canal stenosis noted. Patient has been treated in our office for quite some time, she has underwent multiple lumbar facet injection procedures, both intra-articular and medial branch blocks. Patient states these facet joint injections initially helped significantly, some lasting a year or more. Most recent bilateral L4-L5 and L5-S1 facet joint/medial branch blocks performed on 07/27/2021 provided less than 50% pain relief for 2-3 hours. Patient states her severe lower back pain makes it difficult to be active and is negatively impacting her daily life.  Patient has significant medical history including hospitalizations for COPD exacerbation, most recently she was treated at Urgent Care facility and sent home with oral antibiotics. In 2022, patient was hospitalized with bilateral pulmonary emboli,  she did experience GI bleeding due to use of Eliquis which lead to prolonged stay. Patient states prolonged stay in hospital has made recovery significantly difficult as she was bed bound for many weeks. Patient does have history of depression, bipolar affective disorder and generalized anxiety disorder. Unfortunately, she has not been unable to tolerate many psychotropic medications used to treat these disorders. Patient continues to be managed by her primary care provider Dr. Loura Pardon. Patient states Dr. Glori Bickers recently started her on Lyrica for new symptoms of numbness/tingling to bilateral upper extremities, states she has been unable to tolerate this medication due to swelling of bilateral hands/wrists. Patient also has history of seronegative rheumatoid arthritis that is being managed by Dr. Vernelle Emerald at [facility]. Patient continues to take Prednisone and Enbrel. Patient also continues to be managed by Dr. Jean Rosenthal for chronic right knee issues, she did receive recent gel injection to right knee on 08/14/2021.  Patient denies recent trauma or falls. She is using rolling walker and gait belt to assist with ambulation/balance.     Review of Systems  Constitutional:        Pt reports recent weight gain of 50lbs  Cardiovascular:  Positive for leg swelling.  Musculoskeletal:  Positive for back pain, joint pain and myalgias.  Neurological:  Positive for tingling. Negative for sensory change, focal weakness and weakness.       Pt reports feeling of pins and needles to bilateral arms/hands.  All other systems reviewed and are negative.  Otherwise per HPI.  Assessment & Plan: Visit Diagnoses:    ICD-10-CM   1. Chronic bilateral low back pain without sciatica  M54.50  G89.29     2. Spondylosis without myelopathy or radiculopathy, lumbar region  M47.816     3. Facet hypertrophy of lumbar region  M47.816     4. Chronic pain syndrome  G89.4     5. Gait  disturbance  R26.9        Plan: Findings:  Chronic, worsening and severe bilateral lower back pain. No radicular symptoms noted. Patient continues to have severe pain despite good conservative therapies such as in home OT/PT, rest and use of medications. Patient's clinical presentation and exam are consistent with facet mediated pain. Recent facet joint/medial branch blocks performed in our office provided little to no relief of pain. At this time, we do not feel repeating lumbar injections would be beneficial to patient. I did discuss possible lumbar epidural steroid injection in the future if her pain seems to be more radicular in nature. I also spoke with her about medication management and feel she would be a good candidate for referral to pain specialist. I encouraged patient to continue working to improve ambulation and overall strength. I would like her to follow back up in our office in approximately 8 weeks for re-evaluation. No red flag symptoms noted upon exam today. Patient encouraged to follow up with Dr. Milinda Antis and Dr. Dimple Casey as needed.     Meds & Orders: No orders of the defined types were placed in this encounter.  No orders of the defined types were placed in this encounter.   Follow-up: Return for 8 week follow up for re-evaluation.   Procedures: No procedures performed      Clinical History: MR SPINE LUMBAR WWO CONTRAST, 02/14/2021 4:06 PM   INDICATION: Myelopathy, acute, lumbar spine \ Of note - there is a MRI L spine in care everywhere to compare \ R53.1 Weakness  COMPARISON: Outside noncontrast MRI lumbar spine 12/09/2020 ([facility]), images not available for comparison.   TECHNIQUE: Multiplanar, multisequence surface-coil magnetic resonance imaging of the lumbar spine was performed before and after intravenous administration of gadolinium-based contrast media. Images were acquired from the levels of the lower thoracic spine to the upper sacral region.   FINDINGS:   .  Alignment: Mild broad-based dextrocurvature of the lumbar spine.  . Vertebrae: Vertebral body heights are maintained. No diffuse abnormal marrow signal. Mixed edema and fatty signal at the L4-L5 endplates. Mild left greater than right increased STIR signal within the L1-L4 facets which can be seen with chronic stress due to altered biomechanics or degenerative changes, most notable about the left L3-L4 facet (series 14 image 26) with adjacent soft tissue edema and enhancement.  . Conus medullaris: The conus terminates at the level of L1. Normal signal and contour.  Marland Kitchen Upper sacrum: No focal lesion.   . Degenerative changes:       T12-L1: No significant disc height loss. No significant narrowing of the spinal canal or neural foramina.       L1-L2: Minimal disc height loss desiccation. Minimal left eccentric disc bulge with mild endplate spurring which narrow the left lateral recess without substantial canal or foraminal stenosis.       L2-L3: No significant disc height loss. No significant narrowing of the spinal canal or neural foramina. Trace bilateral facet joint effusions with mild facet arthropathy.       L3-L4: No significant disc height loss. Broad-based disc bulge and facet hypertrophy contribute to mild bilateral foraminal stenosis. Trace right facet joint effusion. Soft tissue edema surrounding the left facet as described above.  L4-L5: Moderate to advanced disc height loss and desiccation. Minimal broad-based disc bulge with facet arthropathy contributing to mild narrowing of the bilateral lateral recesses with mild bilateral foraminal stenosis.       L5-S1: No significant disc height loss. Mild broad-based disc bulge and minimal marginal osteophyte spurring contribute to mild lateral recess narrowing with mild right greater than left foraminal stenosis.   Additional comments: Marked lower paraspinal musculature and psoas sarcopenia. No paraspinal fluid collections seen.   CONCLUSION:    1.  No abnormal cord signal or high grade canal or foraminal stenosis.  2.  Mild edema changes throughout the left greater than right L1-L4 facets, most notably at the L3-L4 facets with surrounding soft tissue edema and enhancement. Findings are presumed secondary to inflammation from degenerative changes rather than an infectious etiology. No fluid collection to suggest abscess. If there is ongoing concern for infection in the lumbar spine, short-term interval follow up imaging could reassess.  3.  Mixed fat and edema signal at the L4-L5 endplates likely secondary to degenerative disc disease.  Exam End: 02/14/21   She reports that she quit smoking about 8 years ago. Her smoking use included e-cigarettes and cigarettes. She has a 80.00 pack-year smoking history. She has never used smokeless tobacco.  Recent Labs    08/29/20 0110 08/29/20 0412 07/11/21 0938  HGBA1C  --  5.5 5.4  LABURIC 4.9  --   --     Objective:  VS:  HT:    WT:   BMI:     BP: (!) 162/79  HR:74bpm  TEMP: ( )  RESP:  Physical Exam Vitals and nursing note reviewed.  Constitutional:      Appearance: She is ill-appearing.  HENT:     Head: Normocephalic and atraumatic.     Right Ear: External ear normal.     Left Ear: External ear normal.     Nose: Nose normal.     Mouth/Throat:     Mouth: Mucous membranes are moist.  Eyes:     Extraocular Movements: Extraocular movements intact.  Cardiovascular:     Rate and Rhythm: Normal rate.     Pulses: Normal pulses.  Pulmonary:     Effort: Pulmonary effort is normal.  Abdominal:     General: Abdomen is flat. There is no distension.  Musculoskeletal:        General: Tenderness present.     Cervical back: Normal range of motion.     Right lower leg: Edema present.     Left lower leg: Edema present.     Comments: Pt rises from seated position to standing without difficulty. Concordant low back pain with facet loading, lumbar spine extension and rotation. Strong  distal strength without clonus, no pain upon palpation of greater trochanters. Sensation intact bilaterally. Ambulates with rolling walker, gait slow and unsteady.  Skin:    General: Skin is warm and dry.     Capillary Refill: Capillary refill takes less than 2 seconds.  Neurological:     Mental Status: She is oriented to person, place, and time.     Gait: Gait abnormal.  Psychiatric:        Mood and Affect: Mood normal.        Behavior: Behavior normal.     Ortho Exam  Imaging: No results found.  Past Medical/Family/Surgical/Social History: Medications & Allergies reviewed per EMR, new medications updated. Patient Active Problem List   Diagnosis Date Noted   Vitamin B12 deficiency 08/02/2021  Myofascial pain dysfunction syndrome 08/02/2021   Adverse effect of prednisone 07/11/2021   Prediabetes 07/11/2021   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   History of pulmonary embolism 12/08/2020   Arthritis of knee 12/06/2020   Bilateral knee pain 09/20/2020   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   High risk medication use 09/20/2020   Extensor tenosynovitis of right wrist 09/15/2020   S/P reverse total shoulder arthroplasty, 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   Lung nodule 01/02/2020   COVID-19 03/31/2019   Status post arthroscopy of left shoulder 01/16/2018   Estrogen deficiency 06/28/2017   Medial epicondylitis, left elbow 04/29/2017   S/P arthroscopy of left shoulder 02/25/2017   Impingement syndrome of left shoulder 01/30/2017   Pedal edema 10/26/2016   Trochanteric bursitis, right hip 04/10/2016   Essential hypertension 09/02/2015   Urge incontinence 07/27/2015   Obesity 04/28/2015   Screening for HIV (human immunodeficiency virus) 04/26/2015   Osteoarthritis of left knee 01/28/2015   Status post total left knee replacement 01/28/2015   Vitamin D deficiency 04/27/2014   Seasonal and perennial allergic rhinitis 06/17/2013   S/P total hip arthroplasty 03/20/2013    Medicare annual wellness visit, subsequent 11/18/2012   History of falling 11/18/2012   Routine general medical examination at a health care facility 11/10/2012   Lichen sclerosus et atrophicus of the vulva 07/29/2012   Osteopenia 10/19/2011   Hypothyroidism 10/19/2011   ANEMIA 10/11/2009   Hereditary and idiopathic peripheral neuropathy 08/05/2009   HAND PAIN, BILATERAL 08/05/2009   TREMOR 03/09/2008   MENOPAUSAL SYNDROME 10/09/2007   DEPRESSION, MAJOR 05/20/2007   BIPOLAR AFFECTIVE DISORDER 05/20/2007   Generalized anxiety disorder 05/20/2007   PERSONALITY DISORDER 05/20/2007   ESOPHAGEAL SPASM 05/20/2007   Diaphragmatic hernia 05/20/2007   AMAUROSIS FUGAX 05/07/2007   COPD mixed type (HCC) 03/27/2007   HYPERCHOLESTEROLEMIA, PURE 12/10/2006   SYMPTOM, SYNDROME, CHRONIC FATIGUE 12/10/2006   Past Medical History:  Diagnosis Date   Amaurosis fugax    Anemia    hx   Anxiety    Arthritis    Asthma    Cataract    Chronic fatigue syndrome    Chronic kidney disease    frequency, nephritis when 71 yrs old   COPD (chronic obstructive pulmonary disease) (HCC)    COVID-19    Depression    Diverticulitis    Emphysema of lung (HCC)    Fibromyalgia    GERD (gastroesophageal reflux disease)    occ   H/O hiatal hernia    History of bronchitis    Hyperlipidemia    Hypothyroidism    Interstitial cystitis    Irritable bowel syndrome    Lichen sclerosus    Lumbar herniated disc    Migraines    Pneumonia    PONV (postoperative nausea and vomiting)    Shortness of breath    exertion    Thyroid disease    Graves   Urinary frequency    Urinary tract infection    Vertigo    Family History  Problem Relation Age of Onset   Arthritis Mother    Hypertension Mother    Kidney disease Mother    Cancer Father        Bladder   Arthritis Sister    Colon cancer Other    Cancer - Lung Cousin    Arthritis Maternal Grandmother    Past Surgical History:  Procedure Laterality Date    ABDOMINAL HYSTERECTOMY     bladder sugery      CAROTID DOPPLAR  COLONOSCOPY  05/26/2010   avms- otherwise nl , re check 10y   [place]     elbow surgery     EPICONDYLITIS     EYE SURGERY Bilateral    cataracts   FOOT SURGERY Bilateral    I & D EXTREMITY Right 09/01/2020   Procedure: TYNOSYNOVECTOMY;  Surgeon: Mcarthur Rossetti, MD;  Location: [facility];  Service: Orthopedics;  Laterality: Right;   KNEE SURGERY     Left cartilage   lipoma in second finger right hand     PLANTAR FASCIA SURGERY Left    REVERSE SHOULDER ARTHROPLASTY Left 02/02/2020   Procedure: LEFT REVERSE SHOULDER ARTHROPLASTY;  Surgeon: Meredith Pel, MD;  Location: [facility];  Service: Orthopedics;  Laterality: Left;   ROTATOR CUFF REPAIR Left 12/2017   TEMPOROMANDIBULAR JOINT SURGERY     TONSILLECTOMY     TOTAL HIP ARTHROPLASTY Right 03/20/2013   Procedure: Right TOTAL HIP ARTHROPLASTY;  Surgeon: Newt Minion, MD;  Location: [facility];  Service: Orthopedics;  Laterality: Right;  Right Total Hip Arthroplasty   TOTAL KNEE ARTHROPLASTY Left 01/28/2015   Procedure: LEFT TOTAL KNEE ARTHROPLASTY;  Surgeon: Mcarthur Rossetti, MD;  Location: [facility];  Service: Orthopedics;  Laterality: Left;   TRIGGER FINGER RELEASE Right    TROCHANTERIC BURSA EXCISION Right 05/03/2016   Dr. Jean Rosenthal   WRIST ARTHROSCOPY Right    ligament tear   Social History   Occupational History   Not on file  Tobacco Use   Smoking status: Former    Packs/day: 2.00    Years: 40.00    Total pack years: 80.00    Types: E-cigarettes, Cigarettes    Quit date: 2015    Years since quitting: 8.4   Smokeless tobacco: Never  Vaping Use   Vaping Use: Former   Quit date: 11/26/2020   Devices: no nicotine in it  Substance and Sexual Activity   Alcohol use: No    Alcohol/week: 0.0 standard drinks of alcohol   Drug use: No   Sexual activity: Not Currently

## 2021-08-22 ENCOUNTER — Other Ambulatory Visit: Payer: Self-pay | Admitting: Physical Medicine and Rehabilitation

## 2021-08-22 DIAGNOSIS — M47816 Spondylosis without myelopathy or radiculopathy, lumbar region: Secondary | ICD-10-CM

## 2021-08-22 DIAGNOSIS — G8929 Other chronic pain: Secondary | ICD-10-CM

## 2021-08-22 DIAGNOSIS — G894 Chronic pain syndrome: Secondary | ICD-10-CM

## 2021-08-24 ENCOUNTER — Telehealth: Payer: Self-pay | Admitting: Physical Medicine and Rehabilitation

## 2021-08-24 NOTE — Telephone Encounter (Signed)
Patient called asked for a call back concerning to her back. Patient said she left  mychart  messages as well. The number to contact patient is [phone]

## 2021-08-25 DIAGNOSIS — E039 Hypothyroidism, unspecified: Secondary | ICD-10-CM | POA: Diagnosis not present

## 2021-08-25 DIAGNOSIS — J449 Chronic obstructive pulmonary disease, unspecified: Secondary | ICD-10-CM

## 2021-08-25 DIAGNOSIS — N1831 Chronic kidney disease, stage 3a: Secondary | ICD-10-CM

## 2021-08-25 DIAGNOSIS — E785 Hyperlipidemia, unspecified: Secondary | ICD-10-CM

## 2021-08-25 DIAGNOSIS — F319 Bipolar disorder, unspecified: Secondary | ICD-10-CM

## 2021-08-25 DIAGNOSIS — I10 Essential (primary) hypertension: Secondary | ICD-10-CM

## 2021-08-25 DIAGNOSIS — D649 Anemia, unspecified: Secondary | ICD-10-CM

## 2021-09-01 ENCOUNTER — Telehealth: Payer: Self-pay | Admitting: Physical Medicine and Rehabilitation

## 2021-09-01 NOTE — Telephone Encounter (Signed)
Please resend referral over

## 2021-09-11 ENCOUNTER — Telehealth: Payer: Self-pay | Admitting: Internal Medicine

## 2021-09-11 NOTE — Telephone Encounter (Signed)
Patient called the office stating her prednisone was cut down to 5mg  and she is now flaring. Patient states her jaw is so sore she cannot eat and the rest of the joints in her body are painful. Patient states she isn't due to see Dr. for another month. Patient states she cannot keep dealing with this pain until her next appointment and requests a call back on what to do.

## 2021-09-13 ENCOUNTER — Telehealth: Payer: Self-pay

## 2021-09-13 NOTE — Chronic Care Management (AMB) (Signed)
Chronic Care Management Pharmacy Assistant   Name: ANAIJA WISSINK  MRN: [id number] DOB: [birth year]   Reason for Encounter:Hypertension  Disease State  Recent office visits:  08/02/21-Marne Tower,MD(PCP)- arm pain- try low dose lyrica 25mg , B12 level checked.  Recent consult visits:  08/17/21-Megan Williams,NP(PM&R)-Back pain,reviewed scans, no medication changes, f/u 8 weeks  08/14/21-Gilbert Clark.PA(ortho)-rt.knee pain, Hyaluronan injection,f/u 6 months   Hospital visits:  None in previous 6 months  Medications: Outpatient Encounter Medications as of 09/13/2021  Medication Sig   albuterol (VENTOLIN HFA) 108 (90 Base) MCG/ACT inhaler Inhale 1-2 puffs into the lungs every 4 (four) hours as needed for wheezing or shortness of breath.   Black Cohosh 40 MG CAPS Take 1 capsule (40 mg total) by mouth daily.   budesonide-formoterol (SYMBICORT) 160-4.5 MCG/ACT inhaler Inhale 2 puffs into the lungs 2 (two) times daily.   buPROPion (WELLBUTRIN XL) 150 MG 24 hr tablet Take 150 mg by mouth daily.   busPIRone (BUSPAR) 15 MG tablet Take 15 mg by mouth 2 (two) times daily before a meal.   cholecalciferol (VITAMIN D) 25 MCG (1000 UNIT) tablet Take 2,000 Units by mouth daily. 2 tablets daily   clobetasol cream (TEMOVATE) 0.05 % APPLY SMALL AMOUNT TO AFFECTED AREAS TWO TO THREE TIMES PER WEEK   cyanocobalamin 2000 MCG tablet Take 1,000 mcg by mouth daily.   diclofenac Sodium (VOLTAREN) 1 % GEL Apply 2 g topically 2 (two) times daily as needed (pain).   diphenhydrAMINE (BENADRYL) 25 MG tablet Take 25 mg by mouth every 6 (six) hours as needed for allergies.   divalproex (DEPAKOTE ER) 500 MG 24 hr tablet Take 500 mg by mouth at bedtime.    etanercept (ENBREL SURECLICK) 50 MG/ML injection Inject 50 mg into the skin once a week.   fluticasone (FLONASE) 50 MCG/ACT nasal spray PLACE 2 SPRAYS INTO THE NOSE DAILY. (Patient taking differently: Place 2 sprays into both nostrils. PLACE 2 SPRAYS INTO THE NOSE  DAILY.)   HYDROcodone-acetaminophen (NORCO/VICODIN) 5-325 MG tablet Take 1 tablet by mouth 3 (three) times daily as needed for moderate pain.   ipratropium-albuterol (DUONEB) 0.5-2.5 (3) MG/3ML SOLN Take 3 mLs by nebulization every 6 (six) hours as needed.   ketotifen (ZADITOR) 0.025 % ophthalmic solution Place 1 drop into both eyes 2 (two) times daily as needed (allergies).   levothyroxine (SYNTHROID) 50 MCG tablet Take 1 tablet (50 mcg total) by mouth at bedtime.   LORazepam (ATIVAN) 1 MG tablet Take 1 mg by mouth at bedtime as needed for anxiety or sleep.   metoprolol succinate (TOPROL-XL) 25 MG 24 hr tablet Take 0.5 tablets (12.5 mg total) by mouth in the morning and at bedtime.   mirabegron ER (MYRBETRIQ) 50 MG TB24 tablet Take 1 tablet (50 mg total) by mouth daily.   mirtazapine (REMERON) 30 MG tablet Take 30 mg by mouth at bedtime.    ondansetron (ZOFRAN-ODT) 4 MG disintegrating tablet Take 1 tablet (4 mg total) by mouth every 8 (eight) hours as needed for nausea or vomiting.   predniSONE (DELTASONE) 10 MG tablet Take by mouth once daily as directed   pregabalin (LYRICA) 25 MG capsule Take 1 capsule (25 mg total) by mouth 2 (two) times daily.   Rhubarb (ESTROVEN COMPLETE) 4 MG TABS Take 4 mg by mouth daily.   sertraline (ZOLOFT) 100 MG tablet Take 100 mg by mouth daily.    simvastatin (ZOCOR) 20 MG tablet TAKE ONE TABLET BY MOUTH EVERY NIGHT AT BEDTIME   Spacer/Aero-Holding  Chambers (AEROCHAMBER PLUS) inhaler Use with inhaler   No facility-administered encounter medications on file as of 09/13/2021.     Recent Office Vitals: BP Readings from Last 3 Encounters:  08/17/21 (!) 162/79  08/02/21 (!) 150/72  07/27/21 (!) 154/75   Pulse Readings from Last 3 Encounters:  08/17/21 74  08/02/21 67  07/27/21 74    Wt Readings from Last 3 Encounters:  08/02/21 196 lb (88.9 kg)  07/11/21 186 lb 9.6 oz (84.6 kg)  07/10/21 170 lb (77.1 kg)     Kidney Function Lab Results  Component  Value Date/Time   CREATININE 1.10 07/11/2021 09:38 AM   CREATININE 1.02 (H) 07/05/2021 01:47 PM   CREATININE 1.13 05/16/2021 03:26 PM   CREATININE 1.09 (H) 02/08/2021 04:19 PM   GFR 50.71 (L) 07/11/2021 09:38 AM   GFRNONAA >60 09/06/2020 02:46 AM   GFRAA >60 01/29/2015 05:10 AM       Latest Ref Rng & Units 07/11/2021    9:38 AM 07/05/2021    1:47 PM 05/16/2021    3:26 PM  BMP  Glucose 70 - 99 mg/dL 88  481  90   BUN 6 - 23 mg/dL 31  33  29   Creatinine 0.40 - 1.20 mg/dL 8.56  3.14  9.70   BUN/Creat Ratio 6 - 22 (calc)  32    Sodium 135 - 145 mEq/L 140  142  138   Potassium 3.5 - 5.1 mEq/L 4.1  4.7  4.9   Chloride 96 - 112 mEq/L 101  103  98   CO2 19 - 32 mEq/L 29  30  28    Calcium 8.4 - 10.5 mg/dL 9.3  9.3  9.6      Contacted patient on 09/13/21 to discuss hypertension disease state  Current antihypertensive regimen:   Metoprolol succinate 25 mg - 1/2 tab BID    Patient verbally confirms she is taking the above medications as directed. Yes  How often are you checking your Blood Pressure? daily  she checks her blood pressure at different times during the day   Current home BP readings:   DATE:             BP               PULSE    136/66    138/67    143/75    129/67    152/78    140/71    143/68    160/78  No symptoms with the elevated pressure.   Wrist or arm cuff: arm cuff Caffeine intake: 2-3 cups regular coffee in am  Salt intake:limits with adding to food Over the counter medications including pseudoephedrine or NSAIDs?  Tylenol through out the day   Any readings above 180/120? No  What recent interventions/DTPs have been made by any provider to improve Blood Pressure control since last CPP Visit:  None identified  Any recent hospitalizations or ED visits since last visit with CPP? No  What diet changes have been made to improve Blood Pressure Control?   Patient reports increase in water intake   What exercise is being done to improve your Blood  Pressure Control?    Very limited at  this time due to severe back pain- appointment soon with [facility] Cram,MD Neurosurgery.  Adherence Review: Is the patient currently on ACE/ARB medication? No Does the patient have >5 day gap between last estimated fill dates? No   Star Rating Drugs:  Medication:  Last Fill: Day  Supply Simvastatin 100mg  08/08/21 90   Care Gaps: Annual wellness visit in last year? Yes Most Recent BP reading:150/72  67-P   Upcoming appointments: CCM appointment on 10/31/21 and Pulmonology appointment with on 12/19/21   Al Corpus, CPP notified  Burt Knack, [facility] concierge  [phone]

## 2021-09-19 ENCOUNTER — Other Ambulatory Visit: Payer: Self-pay | Admitting: Internal Medicine

## 2021-09-19 DIAGNOSIS — Z79899 Other long term (current) drug therapy: Secondary | ICD-10-CM

## 2021-09-19 DIAGNOSIS — M0600 Rheumatoid arthritis without rheumatoid factor, unspecified site: Secondary | ICD-10-CM

## 2021-09-19 MED ORDER — ENBREL SURECLICK 50 MG/ML ~~LOC~~ SOAJ: 50.0000 mg | SUBCUTANEOUS | 0 refills | Status: DC

## 2021-09-19 NOTE — Telephone Encounter (Signed)
Were these symptoms under better control at 10 mg per day prednisone dose before tapering this? She could increase back to that if feeling so badly. I don't have a different medication to recommend right now without following up or taking a look.

## 2021-09-19 NOTE — Telephone Encounter (Signed)
Patient states the symptoms were slightly under better control at 10 mg per day prednisone dose before tapering this. Patient advised she could increase back to that if feeling so badly. Patient advised Dr. Dimple Casey doesn't have a different medication to recommend right now without following up or taking a look. Patient scheduled for 10/09/2021.

## 2021-09-19 NOTE — Telephone Encounter (Signed)
Patient advised prescription has been sent to [facility]. Patient advised to wait about 48 hour to call so they can process her prescription.

## 2021-09-19 NOTE — Telephone Encounter (Signed)
Next Visit: 10/09/2021   Last Visit: 07/05/2021   Last Fill: 07/06/2021   EP:PIRJJOACZYSA rheumatoid arthrit   Current Dose per office note 07/05/2021: Enbrel 50 mg subcu weekly.   Labs: 07/05/2021 Lab results look fine for continuing Enbrel.    TB Gold: 09/20/2020 Neg     Okay to refill Enbrel?

## 2021-09-19 NOTE — Telephone Encounter (Signed)
Patient called requesting prescription refill of Enbrel to be sent to [facility].   Patient requested a return call to let her know when the medication is called in so she can schedule delivery.

## 2021-09-20 ENCOUNTER — Telehealth: Payer: Self-pay | Admitting: Physical Medicine and Rehabilitation

## 2021-09-20 NOTE — Telephone Encounter (Signed)
Patient called needing to cancel her appointment        Number to contact patient if needed is [phone]

## 2021-09-26 NOTE — Progress Notes (Unsigned)
Office Visit Note  Patient: Lori Orozco             Date of Birth: [birth year]           MRN: [id number]             PCP: Abner Greenspan, MD Referring: Tower, Wynelle Fanny, MD Visit Date: 10/09/2021   Subjective:  Follow-up (Swelling in hands and wrists, joint pain, soreness all over.)   History of Present Illness: Lori Orozco is a 71 y.o. 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 She tried tapering prednisone but had severe worsening symptoms at 5 mg dose. She increased back to 10 mg daily and symptoms improved somewhat but still a lot of pain and swelling in both hands and wrists. She has ongoing back pain and stiffness and is seeing D.r Saintclair Halsted for this with known significant lumbar spine arthritis. More recently new problem with sensitivity and stinging type pain throughout both arms with even light pressure such as showering water. She also has worsening weakness in her proximal legs unable to stand without using arms, which are limited by pain.   Previous HPI 07/05/2021 Lori Orozco is a 72 y.o. female here for follow up for seronegative inflammatory arthritis on prednisone 20 mg daily dose and enbrel 16m subcutaneous once weekly. She has seen a large improvement in joint inflammation since starting the Enbrel. She has pain and redness around the injection site lasting up to 3 days with each dose. Wrists continue to hurt and have decreased movement left wrist actually more symptomatic than right now. She is working with physical therapy on getting back to walking currently very weak and unstable due to illness and deconditioning. She had recent ED visit for COPD exacerbation finished antibiotics for this. No other recent infection issues.   Previous HPI 02/08/2021 Lori STALLis a 71y.o. female here for follow up with seronegative inflammatory arthritis on prednisone taper at 15 mg daily  dose.  She has had major medical events since her last visit.  She developed increased knee pain and swelling on the right side.  However she went to the hospital due to developing more weakness and shortness of breath CT angiogram identified bilateral pulmonary emboli.  Treatment with anticoagulation led to significant gastrointestinal bleeding from previously unknown AVM.  This was treated also in the hospital but overall protracted complications lasted for about a month hospitalization.  During this time she experienced worsening joint pain and swelling and significant deconditioning and weakness.  She was discharged from the hospital to rehab facility she was not continued on any steroid or other anti-inflammatory treatment reports joint pain and swelling affecting numerous joints in bilateral elbows wrists fingers and knees.  This is limited any significant progress with physical therapy so far.  She called back to clinic earlier this month with the symptoms and was restarted on a prednisone taper currently down to 15 mg daily.  She saw orthopedics clinic for right knee aspiration injection 2 days ago that is partially helping.   Previous HPI 11/15/20 PFLORIENE JESCHKEis a 71y.o. female here for follow up with wrist wrist extensor tenosynovitis and presumed seronegative inflammatory arthritis also involving knee joints after starting methotrexate 15 mg PO weekly and tapering prednisone off. Her wrist is slightly improved but remains very painful and swollen with decreased mobility. After starting methotrexate she has noticed some  episodes of dizziness and urinary retention and frequency. She stopped the prednisone without noticing much difference in symptoms so far.     09/20/20 Lori Orozco is a 71 y.o. female here for joint pain and swelling of knees and wrists with severe tenosynovitis s/p tenosynovectomy on 09/01/20 with Dr. Ninfa Linden.  She had been feeling in her usual health until June she  recalls onset of symptoms after using a gas leaf blower at her home during which time she experienced some mild right wrist pain.  She does not recall any particular injury event at this time.  However she experienced progressive ongoing increase in pain with swelling and stiffness in her right wrist especially with decreased range of motion and pain over the dorsal aspect.  This is evaluated at orthopedics clinic with aspiration that revealed just coagulated sample trial of steroid medication and only partially improved symptoms then continued worsening.  She was admitted to the hospital for tenosynovectomy that was performed without complication.  Intraoperatively reported extensive inflammatory tissue debridement without any evidence concerning for infection.  During this hospitalization she also developed knee effusion which was aspirated demonstrating inflammatory synovial fluid with negative microscopy and cultures.  Is now almost 3 weeks since her surgery she continues experiencing severe pain intensity swelling around the right hand and wrist and has developed some skin blistering.     Labs reviewed 08/2020 Synovial fluid knee 4,525 WBCs 90% neutrophils ANA neg RF 14.2 CCP neg ESR 37 CRP 14.1 Uric acid 4.9   07/2020 Synovial fluid wrist clotted sample negative gram stain   Review of Systems  Constitutional:  Positive for fatigue.  HENT:  Negative for mouth sores and mouth dryness.   Eyes:  Positive for dryness.  Respiratory:  Positive for shortness of breath.   Cardiovascular:  Negative for chest pain and palpitations.  Gastrointestinal:  Negative for blood in stool, constipation and diarrhea.  Endocrine: Positive for increased urination.  Genitourinary:  Negative for involuntary urination.  Musculoskeletal:  Positive for joint pain, joint pain, joint swelling, myalgias, muscle weakness, muscle tenderness and myalgias. Negative for morning stiffness.  Skin:  Negative for color  change, rash, hair loss and sensitivity to sunlight.  Allergic/Immunologic: Negative for susceptible to infections.  Neurological:  Negative for dizziness and headaches.  Hematological:  Negative for swollen glands.  Psychiatric/Behavioral:  Positive for depressed mood. Negative for sleep disturbance. The patient is nervous/anxious.     PMFS History:  Patient Active Problem List   Diagnosis Date Noted   Vitamin B12 deficiency 08/02/2021   Myofascial pain dysfunction syndrome 08/02/2021   Adverse effect of prednisone 07/11/2021   Prediabetes 07/11/2021   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   History of pulmonary embolism 12/08/2020   Arthritis of knee 12/06/2020   Bilateral knee pain 09/20/2020   Seronegative rheumatoid arthritis ([place]) 09/20/2020   High risk medication use 09/20/2020   Extensor tenosynovitis of right wrist 09/15/2020   S/P reverse total shoulder arthroplasty, 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   Lung nodule 01/02/2020   COVID-19 03/31/2019   Status post arthroscopy of left shoulder 01/16/2018   Estrogen deficiency 06/28/2017   Medial epicondylitis, left elbow 04/29/2017   S/P arthroscopy of left shoulder 02/25/2017   Impingement syndrome of left shoulder 01/30/2017   Pedal edema 10/26/2016   Trochanteric bursitis, right hip 04/10/2016   Essential hypertension 09/02/2015   Urge incontinence 07/27/2015   Obesity 04/28/2015   Screening for HIV (human immunodeficiency virus) 04/26/2015   Osteoarthritis of left knee 01/28/2015  Status post total left knee replacement 01/28/2015   Vitamin D deficiency 04/27/2014   Seasonal and perennial allergic rhinitis 06/17/2013   S/P total hip arthroplasty 03/20/2013   Medicare annual wellness visit, subsequent 11/18/2012   History of falling 11/18/2012   Routine general medical examination at a health care facility 16/11/9602   Lichen sclerosus et atrophicus of the vulva 07/29/2012   Osteopenia 10/19/2011   Hypothyroidism 10/19/2011    ANEMIA 10/11/2009   Hereditary and idiopathic peripheral neuropathy 08/05/2009   HAND PAIN, BILATERAL 08/05/2009   TREMOR 03/09/2008   MENOPAUSAL SYNDROME 10/09/2007   DEPRESSION, MAJOR 05/20/2007   BIPOLAR AFFECTIVE DISORDER 05/20/2007   Generalized anxiety disorder 05/20/2007   PERSONALITY DISORDER 05/20/2007   ESOPHAGEAL SPASM 05/20/2007   Diaphragmatic hernia 05/20/2007   AMAUROSIS FUGAX 05/07/2007   COPD mixed type ([place]) 03/27/2007   HYPERCHOLESTEROLEMIA, PURE 12/10/2006   SYMPTOM, SYNDROME, CHRONIC FATIGUE 12/10/2006    Past Medical History:  Diagnosis Date   Amaurosis fugax    Anemia    hx   Anxiety    Arthritis    Asthma    Cataract    Chronic fatigue syndrome    Chronic kidney disease    frequency, nephritis when 71 yrs old   COPD (chronic obstructive pulmonary disease) ([place])    COVID-19    Depression    Diverticulitis    Emphysema of lung ([facility])    Fibromyalgia    GERD (gastroesophageal reflux disease)    occ   H/O hiatal hernia    History of bronchitis    Hyperlipidemia    Hypothyroidism    Interstitial cystitis    Irritable bowel syndrome    Lichen sclerosus    Lumbar herniated disc    Migraines    Pneumonia    PONV (postoperative nausea and vomiting)    Shortness of breath    exertion    Thyroid disease    [place]   Urinary frequency    Urinary tract infection    Vertigo     Family History  Problem Relation Age of Onset   Arthritis Mother    Hypertension Mother    Kidney disease Mother    Cancer Father        Bladder   Arthritis Sister    Colon cancer Other    Cancer - Lung Cousin    Arthritis Maternal Grandmother    Past Surgical History:  Procedure Laterality Date   ABDOMINAL HYSTERECTOMY     bladder sugery      CAROTID DOPPLAR     COLONOSCOPY  05/26/2010   avms- otherwise nl , re check 10y   DEXA-OSTEOPENIA     DOPPLER ECHOCARDIOGRAPHY     elbow surgery     EPICONDYLITIS     EYE SURGERY Bilateral    cataracts   FOOT  SURGERY Bilateral    I & D EXTREMITY Right 09/01/2020   Procedure: TYNOSYNOVECTOMY;  Surgeon: Mcarthur Rossetti, MD;  Location: [facility];  Service: Orthopedics;  Laterality: Right;   KNEE SURGERY     Left cartilage   lipoma in second finger right hand     PLANTAR FASCIA SURGERY Left    REVERSE SHOULDER ARTHROPLASTY Left 02/02/2020   Procedure: LEFT REVERSE SHOULDER ARTHROPLASTY;  Surgeon: Meredith Pel, MD;  Location: [facility];  Service: Orthopedics;  Laterality: Left;   ROTATOR CUFF REPAIR Left 12/2017   TEMPOROMANDIBULAR JOINT SURGERY     TONSILLECTOMY     TOTAL HIP ARTHROPLASTY Right 03/20/2013  Procedure: Right TOTAL HIP ARTHROPLASTY;  Surgeon: Newt Minion, MD;  Location: [facility];  Service: Orthopedics;  Laterality: Right;  Right Total Hip Arthroplasty   TOTAL KNEE ARTHROPLASTY Left 01/28/2015   Procedure: LEFT TOTAL KNEE ARTHROPLASTY;  Surgeon: Mcarthur Rossetti, MD;  Location: [facility];  Service: Orthopedics;  Laterality: Left;   TRIGGER FINGER RELEASE Right    TROCHANTERIC BURSA EXCISION Right 05/03/2016   Dr. Jean Rosenthal   WRIST ARTHROSCOPY Right    ligament tear   Social History   Social History Narrative   Not on file   Immunization History  Administered Date(s) Administered   Influenza Split 11/19/2011, 11/02/2013   Influenza Whole 12/20/2006, 11/25/2008, 11/26/2009, 11/24/2010   Influenza, High Dose Seasonal PF 12/13/2016   Influenza,inj,Quad PF,6+ Mos 12/06/2015   Influenza-Unspecified 12/08/2012, 12/03/2014, 12/08/2017, 11/10/2018, 10/31/2019   Moderna Sars-Covid-2 Vaccination 06/21/2019, 07/19/2019, 01/22/2020   PFIZER(Purple Top)SARS-COV-2 Vaccination 06/04/2020   Pneumococcal Conjugate-13 12/06/2015   Pneumococcal Polysaccharide-23 01/26/2002, 03/23/2008, 09/07/2010, 06/28/2017   Td 06/26/2000, 10/19/2011   Zoster Recombinat (Shingrix) 09/16/2017     Objective: Vital Signs: BP 137/79 (BP Location: Left Arm, Patient Position: Sitting, Cuff  Size: Normal)   Pulse 74   Resp 13   Ht '5\' 7"'  (1.702 m)   Wt 212 lb (96.2 kg)   BMI 33.20 kg/m    Physical Exam Constitutional:      Appearance: She is obese.  HENT:     Mouth/Throat:     Mouth: Mucous membranes are moist.     Pharynx: Oropharynx is clear.     Comments: Gingival recession and erythema, no focal lesions Eyes:     Conjunctiva/sclera: Conjunctivae normal.  Cardiovascular:     Rate and Rhythm: Normal rate and regular rhythm.  Pulmonary:     Effort: Pulmonary effort is normal.     Breath sounds: Normal breath sounds.  Musculoskeletal:     Right lower leg: No edema.     Left lower leg: No edema.  Skin:    General: Skin is warm and dry.     Findings: Bruising present. No rash.  Neurological:     Mental Status: She is alert.     Motor: Weakness present.     Gait: Gait abnormal.  Psychiatric:        Mood and Affect: Mood normal.      Musculoskeletal Exam:  Neck full ROM no tenderness Left shoulder abduction ROM slightly reduced no tenderness or swelling Elbows full ROM no tenderness or swelling Wrists with bilateral mild ulnar subluxation, ROM is reduced, well healed surgical scar on right, swelling on both sides and painful Right MCP joints in mild flexion deformity, 2nd PIP with synovitis and tenderness in 3nd and 3rd fingers Knees full ROM right knee tenderness to pressure and with movement, no palpable effusion   CDAI Exam: CDAI Score: -- Patient Global: --; Provider Global: -- Swollen: --; Tender: -- Joint Exam 10/09/2021   No joint exam has been documented for this visit   There is currently no information documented on the homunculus. Go to the Rheumatology activity and complete the homunculus joint exam.  Investigation: No additional findings.  Imaging: No results found.  Recent Labs: Lab Results  Component Value Date   WBC 9.6 07/11/2021   HGB 12.2 07/11/2021   PLT 254.0 07/11/2021   NA 140 07/11/2021   K 4.1 07/11/2021   CL 101  07/11/2021   CO2 29 07/11/2021   GLUCOSE 88 07/11/2021   BUN 31 (  H) 07/11/2021   CREATININE 1.10 07/11/2021   BILITOT 0.4 07/11/2021   ALKPHOS 57 07/11/2021   AST 21 07/11/2021   ALT 27 07/11/2021   PROT 6.4 07/11/2021   ALBUMIN 3.8 07/11/2021   CALCIUM 9.3 07/11/2021   GFRAA >60 01/29/2015   QFTBGOLDPLUS NEGATIVE 09/20/2020    Speciality Comments: No specialty comments available.  Procedures:  No procedures performed Allergies: Lithium; Tegretol [carbamazepine]; Tricyclic antidepressants; Methotrexate derivatives; Strawberry extract; Codeine; Cymbalta [duloxetine hcl]; Erythromycin; Lyrica [pregabalin]; Neurontin [gabapentin]; Rabeprazole sodium; Duraprep [antiseptic products, misc.]; Penicillins; and Tape   Assessment / Plan:     Visit Diagnoses: Seronegative rheumatoid arthritis ([place])  High risk medication use - enbrel 1m subcutaneous once weekly, prednisone 262mpo qd  Chronic pain of both knees  ***  Orders: No orders of the defined types were placed in this encounter.  No orders of the defined types were placed in this encounter.    Follow-Up Instructions: No follow-ups on file.   ChCollier SalinaMD  Note - This record has been created using [employer] Chart creation errors have been sought, but may not always  have been located. Such creation errors do not reflect on  the standard of medical care.

## 2021-10-09 ENCOUNTER — Encounter: Payer: Self-pay | Admitting: Internal Medicine

## 2021-10-09 ENCOUNTER — Ambulatory Visit (INDEPENDENT_AMBULATORY_CARE_PROVIDER_SITE_OTHER): Payer: Medicare Other

## 2021-10-09 ENCOUNTER — Ambulatory Visit: Payer: Medicare Other | Attending: Internal Medicine | Admitting: Internal Medicine

## 2021-10-09 VITALS — BP 137/79 | HR 74 | Resp 13 | Ht 67.0 in | Wt 212.0 lb

## 2021-10-09 DIAGNOSIS — Z79899 Other long term (current) drug therapy: Secondary | ICD-10-CM

## 2021-10-09 DIAGNOSIS — R29898 Other symptoms and signs involving the musculoskeletal system: Secondary | ICD-10-CM

## 2021-10-09 DIAGNOSIS — M25562 Pain in left knee: Secondary | ICD-10-CM | POA: Insufficient documentation

## 2021-10-09 DIAGNOSIS — M0600 Rheumatoid arthritis without rheumatoid factor, unspecified site: Secondary | ICD-10-CM

## 2021-10-09 DIAGNOSIS — M25561 Pain in right knee: Secondary | ICD-10-CM | POA: Insufficient documentation

## 2021-10-09 DIAGNOSIS — G8929 Other chronic pain: Secondary | ICD-10-CM | POA: Diagnosis present

## 2021-10-09 NOTE — Patient Instructions (Signed)
Leflunomide Tablets What is this medication? LEFLUNOMIDE (le FLOO na mide) treats the symptoms of rheumatoid arthritis. It works by slowing down an overactive immune system. This decreases inflammation. It belongs to a group of medications called DMARDs. This medicine may be used for other purposes; ask your health care provider or pharmacist if you have questions. COMMON BRAND NAME(S): Arava What should I tell my care team before I take this medication? They need to know if you have any of these conditions: Cancer Diabetes High blood pressure Immune system problems Infection Kidney disease Liver disease Low blood cell levels (white cells, red cells, and platelets) Lung or breathing disease, such as asthma or COPD Recent or upcoming vaccine Skin conditions Tingling of the fingers or toes, or other nerve disorder An unusual or allergic reaction to leflunomide, other medications, food, dyes, or preservatives Pregnant or trying to get pregnant Breastfeeding How should I use this medication? Take this medication by mouth with a full glass of water. Take it as directed on the prescription label at the same time every day. Keep taking it unless your care team tells you to stop. Talk to your care team about the use of this medication in children. Special care may be needed. Overdosage: If you think you have taken too much of this medicine contact a poison control center or emergency room at once. NOTE: This medicine is only for you. Do not share this medicine with others. What if I miss a dose? If you miss a dose, take it as soon as you can. If it is almost time for your next dose, take only that dose. Do not take double or extra doses. What may interact with this medication? Do not take this medication with any of the following: Teriflunomide This medication may also interact with the following: Alosetron Caffeine Cefaclor Certain medications for diabetes, such as nateglinide,  repaglinide, rosiglitazone, pioglitazone Certain medications for high cholesterol, such as atorvastatin, pravastatin, rosuvastatin, simvastatin Charcoal Cholestyramine Ciprofloxacin Duloxetine Estrogen and progestin hormones Furosemide Ketoprofen Live virus vaccines Medications that increase your risk for infection Methotrexate Mitoxantrone Paclitaxel Penicillin Theophylline Tizanidine Warfarin This list may not describe all possible interactions. Give your health care provider a list of all the medicines, herbs, non-prescription drugs, or dietary supplements you use. Also tell them if you smoke, drink alcohol, or use illegal drugs. Some items may interact with your medicine. What should I watch for while using this medication? Visit your care team for regular checks on your progress. Tell your care team if your symptoms do not start to get better or if they get worse. You may need blood work done while you are taking this medication. This medication may cause serious skin reactions. They can happen weeks to months after starting the medication. Contact your care team right away if you notice fevers or flu-like symptoms with a rash. The rash may be red or purple and then turn into blisters or peeling of the skin. You may also notice a red rash with swelling of the face, lips, or lymph nodes in your neck or under your arms. You should not receive certain vaccines during your treatment and for a certain time after your treatment with this medication ends. Talk to your care team for more information. This medication may stay in your body for up to 2 years after your last dose. Tell your care team about any unusual side effects or symptoms. A medication can be given to help lower your blood levels of this   medication more quickly. Talk to your care team if you may be pregnant. This medication can cause serious birth defects if taken during pregnancy and for a while after the last dose. You will  need a negative pregnancy test before starting this medication. Contraception is recommended while taking this medication and for a while after the last dose. Your care team can help you find the option that works for you. Do not breastfeed while taking this medication. What side effects may I notice from receiving this medication? Side effects that you should report to your care team as soon as possible: Allergic reactions--skin rash, itching, hives, swelling of the face, lips, tongue, or throat Dry cough, shortness of breath or trouble breathing Increase in blood pressure Infection--fever, chills, cough, sore throat, wounds that don't heal, pain or trouble when passing urine, general feeling of discomfort or being unwell Redness, blistering, peeling, or loosening of the skin, including inside the mouth Liver injury--right upper belly pain, loss of appetite, nausea, light-colored stool, dark yellow or brown urine, yellowing skin or eyes, unusual weakness or fatigue Pain, tingling, or numbness in the hands or feet Unusual bruising or bleeding Side effects that usually do not require medical attention (report to your care team if they continue or are bothersome): Back pain Diarrhea Hair loss Headache Nausea This list may not describe all possible side effects. Call your doctor for medical advice about side effects. You may report side effects to FDA at 1-800-FDA-1088. Where should I keep my medication? Keep out of the reach of children and pets. Store at room temperature between 20 and 25 degrees C (68 and 77 degrees F). Protect from moisture and light. Keep the container tightly closed. Get rid of any unused medication after the expiration date. To get rid of medications that are no longer needed or have expired: Take the medication to a medication take-back program. Check with your pharmacy or law enforcement to find a location. If you cannot return the medication, ask your pharmacist or  care team how to get rid of this medication safely. NOTE: This sheet is a summary. It may not cover all possible information. If you have questions about this medicine, talk to your doctor, pharmacist, or health care provider.  2023 Elsevier/Gold Standard (2021-02-13 00:00:00)  

## 2021-10-10 ENCOUNTER — Ambulatory Visit: Payer: Medicare Other | Admitting: Physical Medicine and Rehabilitation

## 2021-10-10 ENCOUNTER — Other Ambulatory Visit: Payer: Self-pay | Admitting: Internal Medicine

## 2021-10-10 DIAGNOSIS — M0600 Rheumatoid arthritis without rheumatoid factor, unspecified site: Secondary | ICD-10-CM

## 2021-10-10 NOTE — Telephone Encounter (Addendum)
Next Visit: 12/13/2021  Last Visit: 10/09/2021  Last Fill: 07/05/2021  Dx: Seronegative rheumatoid arthritis  Current Dose per office note on 10/09/2021: prednisone 20mg  po qd  Okay to refill Prednisone?

## 2021-10-12 LAB — CBC WITH DIFFERENTIAL/PLATELET
Absolute Monocytes: 388 cells/uL (ref 200–950)
Basophils Absolute: 19 cells/uL (ref 0–200)
Basophils Relative: 0.2 %
Eosinophils Absolute: 29 cells/uL (ref 15–500)
Eosinophils Relative: 0.3 %
HCT: 37.7 % (ref 35.0–45.0)
Hemoglobin: 12.2 g/dL (ref 11.7–15.5)
Lymphs Abs: 2687 cells/uL (ref 850–3900)
MCH: 32 pg (ref 27.0–33.0)
MCHC: 32.4 g/dL (ref 32.0–36.0)
MCV: 99 fL (ref 80.0–100.0)
MPV: 10.4 fL (ref 7.5–12.5)
Monocytes Relative: 4 %
Neutro Abs: 6577 cells/uL (ref 1500–7800)
Neutrophils Relative %: 67.8 %
Platelets: 236 10*3/uL (ref 140–400)
RBC: 3.81 10*6/uL (ref 3.80–5.10)
RDW: 13.8 % (ref 11.0–15.0)
Total Lymphocyte: 27.7 %
WBC: 9.7 10*3/uL (ref 3.8–10.8)

## 2021-10-12 LAB — COMPLETE METABOLIC PANEL WITH GFR
AG Ratio: 1.6 (calc) (ref 1.0–2.5)
ALT: 18 U/L (ref 6–29)
AST: 20 U/L (ref 10–35)
Albumin: 4.1 g/dL (ref 3.6–5.1)
Alkaline phosphatase (APISO): 50 U/L (ref 37–153)
BUN/Creatinine Ratio: 20 (calc) (ref 6–22)
BUN: 25 mg/dL (ref 7–25)
CO2: 24 mmol/L (ref 20–32)
Calcium: 9.5 mg/dL (ref 8.6–10.4)
Chloride: 103 mmol/L (ref 98–110)
Creat: 1.24 mg/dL — ABNORMAL HIGH (ref 0.60–1.00)
Globulin: 2.6 g/dL (calc) (ref 1.9–3.7)
Glucose, Bld: 96 mg/dL (ref 65–99)
Potassium: 4.8 mmol/L (ref 3.5–5.3)
Sodium: 141 mmol/L (ref 135–146)
Total Bilirubin: 0.6 mg/dL (ref 0.2–1.2)
Total Protein: 6.7 g/dL (ref 6.1–8.1)
eGFR: 47 mL/min/{1.73_m2} — ABNORMAL LOW (ref >=60)

## 2021-10-12 LAB — QUANTIFERON-TB GOLD PLUS
Mitogen-NIL: >10 IU/mL
NIL: 0.03 IU/mL
QuantiFERON-TB Gold Plus: NEGATIVE
TB1-NIL: 0 IU/mL
TB2-NIL: 0 IU/mL

## 2021-10-12 LAB — CK: Total CK: 41 U/L (ref 29–143)

## 2021-10-12 LAB — SEDIMENTATION RATE: Sed Rate: 22 mm/h (ref 0–30)

## 2021-10-12 LAB — C-REACTIVE PROTEIN: CRP: 8.1 mg/L — ABNORMAL HIGH (ref <8.0)

## 2021-10-12 MED ORDER — LEFLUNOMIDE 20 MG PO TABS: 20.0000 mg | ORAL_TABLET | Freq: Every day | ORAL | 2 refills | Status: DC

## 2021-10-26 ENCOUNTER — Telehealth: Payer: Self-pay | Admitting: Internal Medicine

## 2021-10-26 ENCOUNTER — Telehealth: Payer: Self-pay

## 2021-10-26 DIAGNOSIS — M0600 Rheumatoid arthritis without rheumatoid factor, unspecified site: Secondary | ICD-10-CM

## 2021-10-26 DIAGNOSIS — R1310 Dysphagia, unspecified: Secondary | ICD-10-CM

## 2021-10-26 DIAGNOSIS — M542 Cervicalgia: Secondary | ICD-10-CM

## 2021-10-26 NOTE — Chronic Care Management (AMB) (Signed)
Chronic Care Management Pharmacy Assistant   Name: Lori Orozco  MRN: [id number] DOB: 1950-06-20   Reason for Encounter: Reminder Call   Medications: Outpatient Encounter Medications as of 10/26/2021  Medication Sig   albuterol (VENTOLIN HFA) 108 (90 Base) MCG/ACT inhaler Inhale 1-2 puffs into the lungs every 4 (four) hours as needed for wheezing or shortness of breath.   Black Cohosh 40 MG CAPS Take 1 capsule (40 mg total) by mouth daily.   budesonide-formoterol (SYMBICORT) 160-4.5 MCG/ACT inhaler Inhale 2 puffs into the lungs 2 (two) times daily.   buPROPion (WELLBUTRIN XL) 150 MG 24 hr tablet Take 150 mg by mouth daily.   busPIRone (BUSPAR) 15 MG tablet Take 15 mg by mouth 2 (two) times daily before a meal.   cholecalciferol (VITAMIN D) 25 MCG (1000 UNIT) tablet Take 2,000 Units by mouth daily. 2 tablets daily   clobetasol cream (TEMOVATE) 0.05 % APPLY SMALL AMOUNT TO AFFECTED AREAS TWO TO THREE TIMES PER WEEK   cyanocobalamin 2000 MCG tablet Take 1,000 mcg by mouth daily.   diclofenac Sodium (VOLTAREN) 1 % GEL Apply 2 g topically 2 (two) times daily as needed (pain).   diphenhydrAMINE (BENADRYL) 25 MG tablet Take 25 mg by mouth every 6 (six) hours as needed for allergies.   DIPHENHYDRAMINE HCL PO 3 times/day as needed-between meals & bedtime.   divalproex (DEPAKOTE ER) 500 MG 24 hr tablet Take 500 mg by mouth at bedtime.    etanercept (ENBREL SURECLICK) 50 MG/ML injection Inject 50 mg into the skin once a week.   fluticasone (FLONASE) 50 MCG/ACT nasal spray PLACE 2 SPRAYS INTO THE NOSE DAILY. (Patient taking differently: Place 2 sprays into both nostrils. PLACE 2 SPRAYS INTO THE NOSE DAILY.)   HYDROcodone-acetaminophen (NORCO/VICODIN) 5-325 MG tablet Take 1 tablet by mouth 3 (three) times daily as needed for moderate pain.   ipratropium-albuterol (DUONEB) 0.5-2.5 (3) MG/3ML SOLN Take 3 mLs by nebulization every 6 (six) hours as needed.   ketotifen (ZADITOR) 0.025 %  ophthalmic solution Place 1 drop into both eyes 2 (two) times daily as needed (allergies).   leflunomide (ARAVA) 20 MG tablet Take 1 tablet (20 mg total) by mouth daily.   levothyroxine (SYNTHROID) 50 MCG tablet Take 1 tablet (50 mcg total) by mouth at bedtime.   LORazepam (ATIVAN) 1 MG tablet Take 1 mg by mouth at bedtime as needed for anxiety or sleep.   metoprolol succinate (TOPROL-XL) 25 MG 24 hr tablet Take 0.5 tablets (12.5 mg total) by mouth in the morning and at bedtime.   mirabegron ER (MYRBETRIQ) 50 MG TB24 tablet Take 1 tablet (50 mg total) by mouth daily.   mirtazapine (REMERON) 30 MG tablet Take 30 mg by mouth at bedtime.    ondansetron (ZOFRAN-ODT) 4 MG disintegrating tablet Take 1 tablet (4 mg total) by mouth every 8 (eight) hours as needed for nausea or vomiting.   predniSONE (DELTASONE) 10 MG tablet TAKE ONE TABLET BY MOUTH DAILY AS DIRECTED   pregabalin (LYRICA) 25 MG capsule Take 1 capsule (25 mg total) by mouth 2 (two) times daily.   Rhubarb (ESTROVEN COMPLETE) 4 MG TABS Take 4 mg by mouth daily.   sertraline (ZOLOFT) 100 MG tablet Take 100 mg by mouth daily.    simvastatin (ZOCOR) 20 MG tablet TAKE ONE TABLET BY MOUTH EVERY NIGHT AT BEDTIME   Spacer/Aero-Holding Chambers (AEROCHAMBER PLUS) inhaler Use with inhaler   No facility-administered encounter medications on file as of 10/26/2021.  Lori Moats  Orozco was contacted to remind of upcoming telephone visit with Al Corpus on 10/31/21 at 11:00. Patient was reminded to have any blood glucose and blood pressure readings available for review at appointment.   Patient confirmed appointment.  Are you having any problems with your medications? No   Do you have any concerns you like to discuss with the pharmacist? No  CCM referral has been placed prior to visit?  Yes   Star Rating Drugs: Medication:  Last Fill: Day Supply Simvastatin 20mg  08/08/21 90   Summary: CCM Initial visit -Reviewed medications; pt affirms  compliance as prescribed -Query neuropathy - pt reports pins/needles sensation and significant pain when shower water hits her skin, she has stopped showering due to this and takes sponge baths instead; she has messaged rheumatology and best guess is neuropathy; she has not tolerated gabapentin, pregabalin due to "passing out" feeling in the past   Recommendations/Changes made from today's visit: -Consider trial of low-dose pregabalin 25 mg, titrate dose slowly as tolerated - pt to discuss with PCP  08/10/21, CPP notified  Al Corpus, [facility] concierge  [phone]

## 2021-10-26 NOTE — Telephone Encounter (Signed)
Patient called stating Dr. Dimple Casey was referring her for an MRI of her neck and is checking on the status.  Patient requested a return call.

## 2021-10-27 ENCOUNTER — Telehealth: Payer: Self-pay | Admitting: Internal Medicine

## 2021-10-27 ENCOUNTER — Telehealth: Payer: Self-pay | Admitting: Orthopaedic Surgery

## 2021-10-27 ENCOUNTER — Other Ambulatory Visit: Payer: Self-pay | Admitting: Orthopaedic Surgery

## 2021-10-27 MED ORDER — HYDROCODONE-ACETAMINOPHEN 5-325 MG PO TABS: 1.0000 | ORAL_TABLET | Freq: Three times a day (TID) | ORAL | 0 refills | Status: DC | PRN

## 2021-10-27 NOTE — Telephone Encounter (Signed)
Please advise 

## 2021-10-27 NOTE — Telephone Encounter (Signed)
Patient called needing Rx refilled Hydrocodone. The number to contact patient is [phone]

## 2021-10-27 NOTE — Telephone Encounter (Signed)
LMOM for patient to call [facility] to schedule appt.

## 2021-10-27 NOTE — Telephone Encounter (Signed)
MRI cervical spine WO contrast to evaluate neck pain and possible mass effect on swallowing. New order placed today.

## 2021-10-27 NOTE — Telephone Encounter (Signed)
Patient called stating she was returning a call to the office.   

## 2021-10-31 ENCOUNTER — Telehealth: Payer: Self-pay | Admitting: Internal Medicine

## 2021-10-31 ENCOUNTER — Ambulatory Visit (INDEPENDENT_AMBULATORY_CARE_PROVIDER_SITE_OTHER): Payer: Medicare Other | Admitting: Pharmacist

## 2021-10-31 DIAGNOSIS — J449 Chronic obstructive pulmonary disease, unspecified: Secondary | ICD-10-CM

## 2021-10-31 DIAGNOSIS — N3941 Urge incontinence: Secondary | ICD-10-CM

## 2021-10-31 DIAGNOSIS — E7800 Pure hypercholesterolemia, unspecified: Secondary | ICD-10-CM

## 2021-10-31 DIAGNOSIS — I10 Essential (primary) hypertension: Secondary | ICD-10-CM

## 2021-10-31 DIAGNOSIS — M8580 Other specified disorders of bone density and structure, unspecified site: Secondary | ICD-10-CM

## 2021-10-31 NOTE — Telephone Encounter (Signed)
Patient called stating she stopped taking the Leflunomide on Friday, 10/27/21 due to the following side effects:  diarrhea, chills, headaches, joint & muscle pain especially in her wrists.  Patient requested a return call.

## 2021-10-31 NOTE — Progress Notes (Signed)
Chronic Care Management Pharmacy Note  11/06/2021 Name:  Lori Orozco MRN:  [id number] DOB:  07-31-1950  Summary: CCM F/U visit -Reviewed medications; pt affirms compliance as prescribed -Symbicort PAP will end after 01/2022 and she will not be able to afford it in 2024; she can step up to [facility] to continue with AZ&Me PAP in 2024 -Pt reports side effects with leflunomide, she will contact rheumatology to discuss options  Recommendations/Changes made from today's visit: -No med changes  Plan: -Pharmacist follow up televisit scheduled for 3 months   Subjective: Lori Orozco is an 71 y.o. year old female who is a primary patient of Tower, Wynelle Fanny, MD.  The CCM team was consulted for assistance with disease management and care coordination needs.    Engaged with patient by telephone for follow up visit in response to provider referral for pharmacy case management and/or care coordination services.   Consent to Services:  The patient was given information about Chronic Care Management services, agreed to services, and gave verbal consent prior to initiation of services.  Please see initial visit note for detailed documentation.   Patient Care Team: Tower, Wynelle Fanny, MD as PCP - Aris Georgia, MD as Consulting Physician (Ophthalmology) Pearson Grippe, MD as Referring Physician (Psychiatry) Emily Filbert, MD as Consulting Physician (Obstetrics and Gynecology) Deneise Lever, MD as Consulting Physician (Pulmonary Disease) Mcarthur Rossetti, MD as Consulting Physician (Orthopedic Surgery) Newt Minion, MD as Consulting Physician (Orthopedic Surgery) Magnus Sinning, MD as Consulting Physician (Physical Medicine and Rehabilitation) Melinda Crutch, MD as Referring Physician (Otolaryngology) Mel Almond, PhD as Referring Physician (Psychology) Charlton Haws, [facility] as Pharmacist (Pharmacist)  Recent office visits: 08/17/10 pt message - lyrica  causing hand swelling. Ok to stop. 08/02/21 Dr Glori Bickers OV: neuropathy - trial pregabalin 25 mg. Reduce B12 to 1/2 or QOD. 07/11/21 Dr Glori Bickers OV: annual exam - labs stable. No changes.  05/16/21 Dr Glori Bickers OV: hospital f/u (covid and PE); increase Myrbetriq to 50 mg; iron high, hold Niferex and recheck iron 1 month.  03/01/21-PCP-Marne Tower,MD VV: Hospital f/u (Covid). Need to get Symbicort covered.  02/10/21-PCP-Marne Tower,MD-Telemedicine- Hospital follow up -Continue PT in home No medication changes  Recent consult visits: 10/09/21 Dr Benjamine Mola (Rheumatology): RA - failed prednisone taper. Add leflunomide.  07/05/21 Dr Benjamine Mola (Rheumatology): RA. Improved with Enbrel. Start tapering prednisone to 10 mg. Consider leflunomide in future if needed for steroid-sparing.  06/29/21 [facility]: COPD exacerbation. Rx'd cipro, prednisone 20 mg, albuterol.  06/19/21 Dr Annamaria Boots (pulmonary): f/u COPD. No med changes.  05/25/21 Dr Elly Modena (OB/Gyn): postmenopausal sx - rec Black Cohosh, Rhubarb for hot flashes.  05/22/21 PA Clark (ortho): f/u Knee OA. Steroid inj given.  02/25/21-Internal Medicine-Padageshwar Sunkara,MD- Telemedicine hospital follow up- no medication changes 02/08/21-Rheumatology-Christopher Rice,MD-Patient presented for follow up rheumatoid arthritis.Recommending Enbrel start for highly active seronegative arthritis(labs reviewed)No improvement to hydroxychloroquine and side effects with methotrexate.  02/02/21-Orthopedic Surgery-Gil Clark,PA-Patient presented for right knee pain.Injection with prednisone and lidocaine.  11/16/20-OB-GYN-Ugonna Anyanwu,MD-Patient presented for follow up lichen, sclerosus and menopausal symptoms.- clobetasol cream, (PREMARIN) 0.625 MG tablet  11/15/20-Rheumatolog-Christopher Rice,MD- Patient presented for follow up.Will discontinue methotrexate and start hydroxychloroquine 400 mg PO daily.also recommend restarting prednisone at 40 mg PO daily for 10 days if helpful can taper  otherwise discontinue.  Hospital visits: Medication Reconciliation was completed by comparing discharge summary, patient's EMR and Pharmacy list, and upon discussion with patient.   Admitted to the hospital on 02/13/21 due to Hypoxic  respiratory failure. Discharge date was 02/23/21. Discharged from [place], COPD, CAP, recent PE -cannot tolerate anticoagulation (HGB drop requiring transfusion). Stopped Eliquis, aspirin, ibuprofen.   Admitted to the hospital on 12/06/20 due to knee pain,pulmonary embolism. Discharge date was 01/03/21. Discharged from [facility].   -started Eluquis,carvedilol,lidoderm patches, melatonin,pantoprazole,pravastatin.    Objective:  Lab Results  Component Value Date   CREATININE 1.24 (H) 10/09/2021   BUN 25 10/09/2021   GFR 50.71 (L) 07/11/2021   EGFR 47 (L) 10/09/2021   GFRNONAA >60 09/06/2020   GFRAA >60 01/29/2015   NA 141 10/09/2021   K 4.8 10/09/2021   CALCIUM 9.5 10/09/2021   CO2 24 10/09/2021   GLUCOSE 96 10/09/2021    Lab Results  Component Value Date/Time   HGBA1C 5.4 07/11/2021 09:38 AM   HGBA1C 5.5 08/29/2020 04:12 AM   GFR 50.71 (L) 07/11/2021 09:38 AM   GFR 49.15 (L) 05/16/2021 03:26 PM    Last diabetic Eye exam: No results found for: "HMDIABEYEEXA"  Last diabetic Foot exam: No results found for: "HMDIABFOOTEX"   Lab Results  Component Value Date   CHOL 221 (H) 07/11/2021   HDL 70.50 07/11/2021   LDLCALC 123 (H) 07/11/2021   TRIG 136.0 07/11/2021   CHOLHDL 3 07/11/2021       Latest Ref Rng & Units 10/09/2021    1:32 PM 07/11/2021    9:38 AM 07/05/2021    1:47 PM  Hepatic Function  Total Protein 6.1 - 8.1 g/dL 6.7  6.4  6.1   Albumin 3.5 - 5.2 g/dL  3.8    AST 10 - 35 U/L _0 ALT 6 - 29 U/L _1 Alk Phosphatase 39 - 117 U/L  57    Total Bilirubin 0.2 - 1.2 mg/dL 0.6  0.4  0.4     Lab Results  Component Value Date/Time   TSH 3.86  07/11/2021 09:38 AM   TSH 5.59 (H) 05/16/2021 03:26 PM   FREET4 1.08 11/19/2011 03:23 PM       Latest Ref Rng & Units 10/09/2021    1:32 PM 07/11/2021    9:38 AM 07/05/2021    1:47 PM  CBC  WBC 3.8 - 10.8 Thousand/uL 9.7  9.6  9.1   Hemoglobin 11.7 - 15.5 g/dL 12.2  12.2  12.1   Hematocrit 35.0 - 45.0 % 37.7  37.9  38.2   Platelets 140 - 400 Thousand/uL 236  254.0  287     Lab Results  Component Value Date/Time   VD25OH 47.24 07/11/2021 09:38 AM   VD25OH 52.85 07/08/2020 09:11 AM    Clinical ASCVD: No  The 10-year ASCVD risk score (Arnett DK, et al., 2019) is: 24%   Values used to calculate the score:     Age: 86 years     Sex: Female     Is Non-Hispanic African American: No     Diabetic: No     Tobacco smoker: Yes     Systolic Blood Pressure: 409 mmHg     Is BP treated: Yes     HDL Cholesterol: 70.5 mg/dL     Total Cholesterol: 221 mg/dL       08/02/2021    2:02 PM 07/11/2021    8:50 AM 07/10/2021    3:50 PM  Depression screen PHQ 2/9  Decreased Interest 0 0 0  Down, Depressed, Hopeless 3 0  3  PHQ - 2 Score 3 0 3  Altered sleeping  0 0  Tired, decreased energy  3 1  Change in appetite  3 0  Feeling bad or failure about yourself   0 0  Trouble concentrating  1 0  Moving slowly or fidgety/restless   0  Suicidal thoughts  0 0  PHQ-9 Score  7 4  Difficult doing work/chores   Not difficult at all    Social History   Tobacco Use  Smoking Status Former   Packs/day: 2.00   Years: 40.00   Total pack years: 80.00   Types: E-cigarettes, Cigarettes   Quit date: 2015   Years since quitting: 8.6  Smokeless Tobacco Never   BP Readings from Last 3 Encounters:  10/09/21 137/79  08/17/21 (!) 162/79  08/02/21 (!) 150/72   Pulse Readings from Last 3 Encounters:  10/09/21 74  08/17/21 74  08/02/21 67   Wt Readings from Last 3 Encounters:  10/09/21 212 lb (96.2 kg)  08/02/21 196 lb (88.9 kg)  07/11/21 186 lb 9.6 oz (84.6 kg)   BMI Readings from Last 3 Encounters:   10/09/21 33.20 kg/m  08/02/21 30.70 kg/m  07/11/21 29.23 kg/m    Assessment/Interventions: Review of patient past medical history, allergies, medications, health status, including review of consultants reports, laboratory and other test data, was performed as part of comprehensive evaluation and provision of chronic care management services.   SDOH:  (Social Determinants of Health) assessments and interventions performed: No - done May 2023 SDOH Interventions    Flowsheet Row Clinical Support from 07/10/2021 in [place] [facility] [place] [facility] from 05/15/2021 in [place] [facility] [place] from 06/30/2018 in [place] [facility] [place] from 05/09/2016 in [place] at [place] Interventions Intervention Not Indicated -- -- --  Housing Interventions Intervention Not Indicated Intervention Not Indicated -- --  Transportation Interventions Intervention Not Indicated -- -- --  Depression Interventions/Treatment  Medication, Currently on Treatment -- PHQ2-9 Score <4 Follow-up Not Indicated Currently on Treatment, Counseling, Medication  [per pt, she is under care of both psychiatrist and psychologist]  Financial Strain Interventions Intervention Not Indicated Other (Comment)  [Symbicort - enrolled AZ&Me,  provided info for LIS] -- --  Physical Activity Interventions Patient Refused -- -- --  Stress Interventions Intervention Not Indicated -- -- --  Social Connections Interventions Intervention Not Indicated -- -- --       SDOH Screenings   Food Insecurity: No Food Insecurity (07/10/2021)  Housing: Low Risk  (07/10/2021)  Transportation Needs: No Transportation Needs (07/10/2021)  Alcohol Screen: Low Risk  (07/10/2021)  Depression (PHQ2-9): Low Risk  (08/02/2021)  Recent Concern: Depression (PHQ2-9) - Medium Risk (07/11/2021)  Financial Resource Strain: Low Risk   (07/10/2021)  Recent Concern: Financial Resource Strain - High Risk (05/15/2021)  Physical Activity: Inactive (07/10/2021)  Social Connections: Moderately Isolated (07/10/2021)  Stress: No Stress Concern Present (07/10/2021)  Tobacco Use: Medium Risk (10/09/2021)    CCM Care Plan  Allergies  Allergen Reactions   Lithium Anaphylaxis   Tegretol [Carbamazepine] Other (See Comments)    Fever and body aches (fever over 539)   Tricyclic Antidepressants Anaphylaxis    Other reaction(s): Unknown   Methotrexate Derivatives Other (See Comments)    Urinary retention and dizziness  Other reaction(s): Other UTI   Strawberry Extract Hives   Codeine Nausea Only    Makes  pt stay awake   Cymbalta [Duloxetine Hcl] Other (See Comments)    Makes pt pass out    Erythromycin     abdominal pain   Lyrica [Pregabalin]     Felt faint   Neurontin [Gabapentin]     Passes  out   Rabeprazole Sodium     insomnia   Duraprep [Antiseptic Products, Misc.] Itching and Rash    Tolerates Betadine    Penicillins Rash    Tolerated ANCEF 02/02/20  Has patient had a PCN reaction causing immediate rash, facial/tongue/throat swelling, SOB or lightheadedness with hypotension: Yes Has patient had a PCN reaction causing severe rash involving mucus membranes or skin necrosis: No Has patient had a PCN reaction that required hospitalization No Has patient had a PCN reaction occurring within the last 10 years: No If all of the above answers are "NO", then may proceed with Cephalosporin use.    Tape Rash    PT ALLERGIC NYLON TAPE     Medications Reviewed Today     Reviewed by Charlton Haws, [facility] (Pharmacist) on 10/31/21 at 1042  Med List Status: <None>   Medication Order Taking? Sig Documenting Provider Last Dose Status Informant  albuterol (VENTOLIN HFA) 108 (90 Base) MCG/ACT inhaler [id number] Yes Inhale 1-2 puffs into the lungs every 4 (four) hours as needed for wheezing or shortness of breath. Melynda Ripple, MD Taking Active   Black Cohosh 40 MG CAPS [id number] Yes Take 1 capsule (40 mg total) by mouth daily. Constant, Peggy, MD Taking Active   budesonide-formoterol [facility]) 160-4.5 MCG/ACT inhaler [id number] Yes Inhale 2 puffs into the lungs 2 (two) times daily. Tower, Wynelle Fanny, MD Taking Active   buPROPion (WELLBUTRIN XL) 150 MG 24 hr tablet [id number] Yes Take 150 mg by mouth daily. [provider] Taking Active Self  busPIRone (BUSPAR) 15 MG tablet [id number] Yes Take 15 mg by mouth 2 (two) times daily before a meal. [provider] Taking Active Self  cholecalciferol (VITAMIN D) 25 MCG (1000 UNIT) tablet [id number] Yes Take 2,000 Units by mouth daily. 2 tablets daily [provider] Taking Active Self  clobetasol cream (TEMOVATE) 0.05 % [id number] Yes APPLY SMALL AMOUNT TO AFFECTED AREAS TWO TO THREE TIMES PER WEEK Anyanwu, Sallyanne Havers, MD Taking Active   cyanocobalamin 2000 MCG tablet [id number] Yes Take 1,000 mcg by mouth daily. [provider] Taking Active Self  diclofenac Sodium (VOLTAREN) 1 % GEL [id number] Yes Apply 2 g topically 2 (two) times daily as needed (pain). Nolberto Hanlon, MD Taking Active   diphenhydrAMINE (BENADRYL) 25 MG tablet [id number] Yes Take 25 mg by mouth every 6 (six) hours as needed for allergies. [provider] Taking Active Self  divalproex (DEPAKOTE ER) 500 MG 24 hr tablet [id number] Yes Take 500 mg by mouth at bedtime.  [provider] Taking Active Self  etanercept (ENBREL SURECLICK) 50 MG/ML injection [id number] Yes Inject 50 mg into the skin once a week. Collier Salina, MD Taking Active   fluticasone [facility]) 50 MCG/ACT nasal spray [id number] Yes PLACE 2 SPRAYS INTO THE NOSE DAILY.  Patient taking differently: Place 2 sprays into both nostrils. PLACE 2 SPRAYS INTO THE NOSE DAILY.   Tower, Wynelle Fanny, MD Taking Active   HYDROcodone-acetaminophen (NORCO/VICODIN) 5-325 MG tablet [id number] Yes Take 1 tablet by mouth 3  (three) times daily as needed for moderate pain. Mcarthur Rossetti, MD Taking Active   ipratropium-albuterol (DUONEB) 0.5-2.5 (3) MG/3ML SOLN [id number] Yes Take 3 mLs by nebulization every  6 (six) hours as needed. Deneise Lever, MD Taking Active            Med Note Jimmey Ralph, [facility]   Sun Aug 28, 2020 10:00 PM)    ketotifen (ZADITOR) 0.025 % ophthalmic solution [id number] Yes Place 1 drop into both eyes 2 (two) times daily as needed (allergies). [provider] Taking Active Self  leflunomide (ARAVA) 20 MG tablet [id number] Yes Take 1 tablet (20 mg total) by mouth daily. Collier Salina, MD Taking Active   levothyroxine (SYNTHROID) 50 MCG tablet [id number] Yes Take 1 tablet (50 mcg total) by mouth at bedtime. Tower, Wynelle Fanny, MD Taking Active   LORazepam (ATIVAN) 1 MG tablet [id number] Yes Take 1 mg by mouth at bedtime as needed for anxiety or sleep. [provider] Taking Active Self  metoprolol succinate (TOPROL-XL) 25 MG 24 hr tablet [id number] Yes Take 0.5 tablets (12.5 mg total) by mouth in the morning and at bedtime. Tower, Wynelle Fanny, MD Taking Active   mirabegron ER (MYRBETRIQ) 50 MG TB24 tablet [id number] Yes Take 1 tablet (50 mg total) by mouth daily. Tower, Wynelle Fanny, MD Taking Active   mirtazapine (REMERON) 30 MG tablet [id number] Yes Take 30 mg by mouth at bedtime.  [provider] Taking Active Self           Med Note Damita Dunnings, [facility] Rising   Fri Apr 04, 2018  8:11 AM)    ondansetron (ZOFRAN-ODT) 4 MG disintegrating tablet [id number] Yes Take 1 tablet (4 mg total) by mouth every 8 (eight) hours as needed for nausea or vomiting. Tower, Wynelle Fanny, MD Taking Active   predniSONE (DELTASONE) 10 MG tablet [id number] Yes TAKE ONE TABLET BY MOUTH DAILY AS DIRECTED Rice, Resa Miner, MD Taking Active   Rhubarb (ESTROVEN COMPLETE) 4 MG TABS [id number] Yes Take 4 mg by mouth daily. Constant, Peggy, MD Taking Active   sertraline (ZOLOFT) 100 MG tablet [id number] Yes Take 100  mg by mouth daily.  [provider] Taking Active Self           Med Note Tonia Ghent   Fri Apr 04, 2018  8:12 AM)    simvastatin (ZOCOR) 20 MG tablet [id number] Yes TAKE ONE TABLET BY MOUTH EVERY NIGHT AT BEDTIME Tower, Wynelle Fanny, MD Taking Active   Spacer/Aero-Holding Chambers (AEROCHAMBER PLUS) inhaler [id number] Yes Use with inhaler Melynda Ripple, MD Taking Active             Patient Active Problem List   Diagnosis Date Noted   Bilateral leg weakness 10/09/2021   Vitamin B12 deficiency 08/02/2021   Myofascial pain dysfunction syndrome 08/02/2021   Adverse effect of prednisone 07/11/2021   Prediabetes 07/11/2021   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   History of pulmonary embolism 12/08/2020   Arthritis of knee 12/06/2020   Bilateral knee pain 09/20/2020   Seronegative rheumatoid arthritis ([place]) 09/20/2020   High risk medication use 09/20/2020   Extensor tenosynovitis of right wrist 09/15/2020   S/P reverse total shoulder arthroplasty, 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   Lung nodule 01/02/2020   COVID-19 03/31/2019   Status post arthroscopy of left shoulder 01/16/2018   Estrogen deficiency 06/28/2017   Medial epicondylitis, left elbow 04/29/2017   S/P arthroscopy of left shoulder 02/25/2017   Impingement syndrome of left shoulder 01/30/2017   Pedal edema 10/26/2016   Trochanteric bursitis, right hip 04/10/2016   Essential hypertension 09/02/2015   Urge incontinence 07/27/2015   Obesity 04/28/2015   Screening for HIV (human immunodeficiency virus)  04/26/2015   Osteoarthritis of left knee 01/28/2015   Status post total left knee replacement 01/28/2015   Vitamin D deficiency 04/27/2014   Seasonal and perennial allergic rhinitis 06/17/2013   S/P total hip arthroplasty 03/20/2013   Medicare annual wellness visit, subsequent 11/18/2012   History of falling 11/18/2012   Routine general medical examination at a health care facility 46/56/8127   Lichen sclerosus et  atrophicus of the vulva 07/29/2012   Osteopenia 10/19/2011   Hypothyroidism 10/19/2011   ANEMIA 10/11/2009   Hereditary and idiopathic peripheral neuropathy 08/05/2009   HAND PAIN, BILATERAL 08/05/2009   TREMOR 03/09/2008   MENOPAUSAL SYNDROME 10/09/2007   DEPRESSION, MAJOR 05/20/2007   BIPOLAR AFFECTIVE DISORDER 05/20/2007   Generalized anxiety disorder 05/20/2007   PERSONALITY DISORDER 05/20/2007   ESOPHAGEAL SPASM 05/20/2007   Diaphragmatic hernia 05/20/2007   AMAUROSIS FUGAX 05/07/2007   COPD mixed type ([place]) 03/27/2007   HYPERCHOLESTEROLEMIA, PURE 12/10/2006   SYMPTOM, SYNDROME, CHRONIC FATIGUE 12/10/2006    Immunization History  Administered Date(s) Administered   Influenza Split 11/19/2011, 11/02/2013   Influenza Whole 12/20/2006, 11/25/2008, 11/26/2009, 11/24/2010   Influenza, High Dose Seasonal PF 12/13/2016   Influenza,inj,Quad PF,6+ Mos 12/06/2015   Influenza-Unspecified 12/08/2012, 12/03/2014, 12/08/2017, 11/10/2018, 10/31/2019   Moderna Sars-Covid-2 Vaccination 06/21/2019, 07/19/2019, 01/22/2020   PFIZER(Purple Top)SARS-COV-2 Vaccination 06/04/2020   Pneumococcal Conjugate-13 12/06/2015   Pneumococcal Polysaccharide-23 01/26/2002, 03/23/2008, 09/07/2010, 06/28/2017   Td 06/26/2000, 10/19/2011   Zoster Recombinat (Shingrix) 09/16/2017    Conditions to be addressed/monitored:  Hypertension, Hyperlipidemia, COPD, Chronic Kidney Disease, Hypothyroidism, and Overactive Bladder, RA  Care Plan : [place]  Updates made by Charlton Haws, [place] since 11/06/2021 12:00 AM     Problem: Hypertension, Hyperlipidemia, COPD, Chronic Kidney Disease, Hypothyroidism, and Overactive Bladder, RA   Priority: High     Long-Range Goal: Disease mgmt   Start Date: 05/15/2021  Expected End Date: 05/16/2022  This Visit's Progress: On track  Recent Progress: On track  Priority: High  Note:   Current Barriers:  Cost concerns with inhalers Multiple adverse  reactions with medications  Pharmacist Clinical Goal(s):  Patient will contact provider office for questions/concerns as evidenced notation of same in electronic health record through collaboration with PharmD and provider.   Interventions: 1:1 collaboration with Tower, Wynelle Fanny, MD regarding development and update of comprehensive plan of care as evidenced by provider attestation and co-signature Inter-disciplinary care team collaboration (see longitudinal plan of care) Comprehensive medication review performed; medication list updated in electronic medical record  Hypertension / CKD Stage 3a (BP goal <130/80) -Controlled - BP at goal in recent clinic visits -Current home BP readings: SBP 120s-140s -Denies hypotensive/hypertensive symptoms -Current treatment: Metoprolol succinate 25 mg - 1/2 tab BID - Appropriate, Effective, Safe, Accessible -Medications previously tried: amlodipine, furosemide  -Current dietary habits: not discussed -Current exercise habits: limited -Educated on BP goals and benefits of medications for prevention of heart attack, stroke and kidney damage; Importance of home blood pressure monitoring; Symptoms of hypotension and importance of maintaining adequate hydration; -Counseled to monitor BP at home daily -Recommended to continue current medication  Hyperlipidemia: (LDL goal < 100) -Not ideally controlled - LDL 123 (06/2021), which is the highest it has been in over a decade; pt endorses compliance with statin -Current treatment: Simvastatin 20 mg daily HS - Appropriate, Effective, Safe, Accessible -Medications previously tried: n/a  -Educated on Cholesterol goals; Benefits of statin for ASCVD risk reduction; -Consider optimizing statin therapy at follow up  COPD (Goal: control symptoms  and prevent exacerbations) -Controlled - pt reports improved breathing while on Symbicort, now accessible through AZ&Me -Gold Grade: unable to assess -Current COPD  Classification:  A (low sx, <2 exacerbations/yr) -Pulmonary function testing: 10/27/10- FEV1 1.54, FEV1/FVC 0.63, FEF 25-75% 0.84 -Exacerbations requiring treatment in last 6 months: 1 -Current treatment  Albuterol HFA -Appropriate, Effective, Safe, Accessible Symbicort 160-4.5 mcg/act 2 puff BID (PAP) -Appropriate, Effective, Safe, Accessible Duoneb PRN -Appropriate, Effective, Safe, Accessible -Medications previously tried: n/a  -Patient denies consistent use of maintenance inhaler -Frequency of rescue inhaler use: 1-2x daily -Counseled on Proper inhaler technique; Benefits of consistent maintenance inhaler use -Reviewed AZ&Me PAP (Symbicort) - this will no longer be available in 2024; we can upgrade to [facility] to continue with PAP or pt can pay insurance copay for Symbicort in 2024 -Recommend to continue current medication  Bipolar disorder / Depression / Anxiety (Goal: manage symptoms) -Controlled - per patient report; she reports every time they try to change her meds "something goes wrong" and they change it back -PHQ9: 0 (06/2020) - minimal depression -GAD7: not on file -Connected with Dr Pearson Grippe for mental health support -Current treatment: Bupropion XL 150 mg daily -Appropriate, Effective, Safe, Accessible Buspirone 15 mg BID -Appropriate, Effective, Safe, Accessible Mirtazapine 30 mg HS -Appropriate, Effective, Safe, Accessible Sertraline 100 mg daily -Appropriate, Effective, Safe, Accessible Divalproex ER 500 mg daily HS -Appropriate, Effective, Safe, Accessible Lorazepam 1 mg HS prn -Appropriate, Effective, Safe, Accessible -Medications previously tried/failed: n/a -Educated on Benefits of medication for symptom control -Recommended to continue current medication  Osteoporosis screening (Goal prevent fractures) -Controlled - due for DEXA -Last DEXA Scan: 06/2017   T-Score femoral neck: -0.4  T-Score lumbar spine: +0.4  10-year probability of major osteoporotic  fracture: n/a  10-year probability of hip fracture: n/a -Patient is not a candidate for pharmacologic treatment -Current treatment  Vitamin D 1000 IU - 2 daily - Appropriate, Effective, Safe, Accessible -Medications previously tried: n/a  -Recommend weight-bearing and muscle strengthening exercises for building and maintaining bone density. -Recommend Repeat DEXA this year  Hypothyroidism (Goal: maintain TSH in goal range) -Controlled - TSH 3.86 (06/2021) at goal -Current treatment  Levothyroxine 50 mcg HS - Appropriate, Effective, Safe, Accessible -Medications previously tried: n/a  -Recommended to continue current medication  Anemia (Goal: maintain HGB) -Controlled- HGB 12.2 (06/2021) at goal; she is now off of iron after iron/HGB levels normalized -Current treatment  Vitamin B12 2000 mcg daily - Appropriate, Query Effective -Medications previously tried: Niferex -Recommend to continue current medication  OAB (Goal: manage symptoms) -Controlled - pt reports higher dose of Myrbetriq is working reasonably well -Current treatment  Myrbetriq ER 50 mg daily - Appropriate, Effective, Safe, Accessible -Medications previously tried: n/a  -Recommend to continue current medication  Rheumatoid Arthritis (Goal: manage pain) -Controlled- pt reports Enbrel is a "miracle drug" for her, pain is much improved and she is in process of tapering off prednisone -Pt reports leflunomide side effects: diarrhea, muscle pain, headaches, chills -Follows with Dr Benjamine Mola [facility]) -Current treatment  Voltaren gel - Appropriate, Effective, Safe, Accessible Hydrocodone-APAP 5-325 mg TID PRN -Appropriate, Effective, Safe, Accessible Enbrel 50 mg weekly -Appropriate, Effective, Safe, Accessible Prednisone 10 mg daily - Appropriate, Effective, Safe, Accessible Leflunomide 20 mg daily - stopped after a few days -Medications previously tried: plaquenil, methotrexate; neuropathy- baclofen, gabepntin,  pregabalin -Recommended to continue current medication; advised to follow up with rheumatology for next options  Postmenopausal symptoms (Goal: manage symptoms) -Not ideally controlled - pt stopped Premarin  after last visit due to VTE risk; she has started supplements and reports they are not helping much with hot flashes but understands that it can take several months for effect -Current treatment  Black Cohosh - Appropriate, Query Effective, Safe, Accessible Rhubarb/Estroven -Appropriate, Query Effective, Safe, Accessible -Medications previously tried: Premarin -Discussed risk of VTE "clot" with hormone replacement; 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; furthermore pt has not been able to tolerate anticoagulation so would consider her risk even higher for developing a clot  -Recommend to continue current medication  Query Neuropathy -Pt reports pins/needles and pain when the shower water hits her skin and she dries off; she has stopped showering due to this and takes sponge-baths instead; she reports washing her hands under running water does not cause any pain -Pt has messaged PCP and Rheumatologist about this issue and they suspect neuropathy; pt has not tolerated gabapentin (100 mg HS) or pregabalin (75 mg BID) in the past due to dizziness/"passing out" feeling; she has not tolerated duloxetine for same reason (dose unknown) -Pt most recently failed trial of lower-dose pregabalin due to swelling in R hand (June 2023)  Health Maintenance -Vaccine gaps: Shingrix #2 -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 Patient given website and phone number to apply for LIS (Extra Help)  Compliance/Adherence/Medication fill history: Care Gaps: Colonoscopy (due 05/25/20)  Star-Rating Drugs: Simvastatin -LF 05/14/21 X 90 DS --PDC inaccurate due to hospitalizations in Fall 2022  Medication Access: Within the past 30 days, how often has patient missed a dose of medication? 0 Is a pillbox or other method used to improve adherence? Yes  Factors that may affect medication adherence? no barriers identified Are meds synced by current pharmacy? No  Are meds delivered by current pharmacy? No  Does patient experience delays in picking up medications due to transportation concerns? No   [facility] Services Reviewed: Is patient disadvantaged to use [facility]?: No  Current Rx insurance plan: [place] Name and location of Current pharmacy:  [id number] - [place], [place] [address] Phone: [phone] Fax: [phone]  [facility] services reviewed with patient today?: No  Patient requests to transfer care to [facility]?: No  Reason patient declined to change pharmacies: Loyalty to other pharmacy/Patient preference   Care Plan and Follow Up Patient Decision:  Patient agrees to Care Plan and Follow-up.  Plan: Telephone follow up appointment with care management team member scheduled for:  3 months  Charlene Brooke, PharmD, BCACP Clinical Pharmacist [facility] at [facility] [phone]

## 2021-11-02 NOTE — Telephone Encounter (Signed)
I spoke with Lori Orozco about this she had pretty severe intolerance to the leflunomide with GI symptoms headaches chills and joint pain in her hands and wrists.  She discontinued the medication and all of these symptoms improved within a few days.  She still has the existing joint pain in hands and wrist from her underlying arthritis.  She has been scheduled for the cervical spine MRI we discussed on September 14.

## 2021-11-06 NOTE — Patient Instructions (Signed)
Visit Information  Phone number for Pharmacist: [phone]   Goals Addressed   None     Care Plan : CCM Pharmacy Care Plan  Updates made by Kathyrn Sheriff, RPH since 11/06/2021 12:00 AM     Problem: Hypertension, Hyperlipidemia, COPD, Chronic Kidney Disease, Hypothyroidism, and Overactive Bladder, RA   Priority: High     Long-Range Goal: Disease mgmt   Start Date: 05/15/2021  Expected End Date: 05/16/2022  This Visit's Progress: On track  Recent Progress: On track  Priority: High  Note:   Current Barriers:  Cost concerns with inhalers Multiple adverse reactions with medications  Pharmacist Clinical Goal(s):  Patient will contact provider office for questions/concerns as evidenced notation of same in electronic health record through collaboration with PharmD and provider.   Interventions: 1:1 collaboration with Tower, Audrie Gallus, MD regarding development and update of comprehensive plan of care as evidenced by provider attestation and co-signature Inter-disciplinary care team collaboration (see longitudinal plan of care) Comprehensive medication review performed; medication list updated in electronic medical record  Hypertension / CKD Stage 3a (BP goal <130/80) -Controlled - BP at goal in recent clinic visits -Current home BP readings: SBP 120s-140s -Denies hypotensive/hypertensive symptoms -Current treatment: Metoprolol succinate 25 mg - 1/2 tab BID - Appropriate, Effective, Safe, Accessible -Medications previously tried: amlodipine, furosemide  -Current dietary habits: not discussed -Current exercise habits: limited -Educated on BP goals and benefits of medications for prevention of heart attack, stroke and kidney damage; Importance of home blood pressure monitoring; Symptoms of hypotension and importance of maintaining adequate hydration; -Counseled to monitor BP at home daily -Recommended to continue current medication  Hyperlipidemia: (LDL goal < 100) -Not  ideally controlled - LDL 123 (06/2021), which is the highest it has been in over a decade; pt endorses compliance with statin -Current treatment: Simvastatin 20 mg daily HS - Appropriate, Effective, Safe, Accessible -Medications previously tried: n/a  -Educated on Cholesterol goals; Benefits of statin for ASCVD risk reduction; -Consider optimizing statin therapy at follow up  COPD (Goal: control symptoms and prevent exacerbations) -Controlled - pt reports improved breathing while on Symbicort, now accessible through AZ&Me -Gold Grade: unable to assess -Current COPD Classification:  A (low sx, <2 exacerbations/yr) -Pulmonary function testing: 10/27/10- FEV1 1.54, FEV1/FVC 0.63, FEF 25-75% 0.84 -Exacerbations requiring treatment in last 6 months: 1 -Current treatment  Albuterol HFA -Appropriate, Effective, Safe, Accessible Symbicort 160-4.5 mcg/act 2 puff BID (PAP) -Appropriate, Effective, Safe, Accessible Duoneb PRN -Appropriate, Effective, Safe, Accessible -Medications previously tried: n/a  -Patient denies consistent use of maintenance inhaler -Frequency of rescue inhaler use: 1-2x daily -Counseled on Proper inhaler technique; Benefits of consistent maintenance inhaler use -Reviewed AZ&Me PAP (Symbicort) - this will no longer be available in 2024; we can upgrade to [facility] to continue with PAP or pt can pay insurance copay for Symbicort in 2024 -Recommend to continue current medication  Bipolar disorder / Depression / Anxiety (Goal: manage symptoms) -Controlled - per patient report; she reports every time they try to change her meds "something goes wrong" and they change it back -PHQ9: 0 (06/2020) - minimal depression -GAD7: not on file -Connected with Dr Phillip Heal for mental health support -Current treatment: Bupropion XL 150 mg daily -Appropriate, Effective, Safe, Accessible Buspirone 15 mg BID -Appropriate, Effective, Safe, Accessible Mirtazapine 30 mg HS -Appropriate, Effective,  Safe, Accessible Sertraline 100 mg daily -Appropriate, Effective, Safe, Accessible Divalproex ER 500 mg daily HS -Appropriate, Effective, Safe, Accessible Lorazepam 1 mg HS prn -Appropriate, Effective, Safe, Accessible -Medications  previously tried/failed: n/a -Educated on Benefits of medication for symptom control -Recommended to continue current medication  Osteoporosis screening (Goal prevent fractures) -Controlled - due for DEXA -Last DEXA Scan: 06/2017   T-Score femoral neck: -0.4  T-Score lumbar spine: +0.4  10-year probability of major osteoporotic fracture: n/a  10-year probability of hip fracture: n/a -Patient is not a candidate for pharmacologic treatment -Current treatment  Vitamin D 1000 IU - 2 daily - Appropriate, Effective, Safe, Accessible -Medications previously tried: n/a  -Recommend weight-bearing and muscle strengthening exercises for building and maintaining bone density. -Recommend Repeat DEXA this year  Hypothyroidism (Goal: maintain TSH in goal range) -Controlled - TSH 3.86 (06/2021) at goal -Current treatment  Levothyroxine 50 mcg HS - Appropriate, Effective, Safe, Accessible -Medications previously tried: n/a  -Recommended to continue current medication  Anemia (Goal: maintain HGB) -Controlled- HGB 12.2 (06/2021) at goal; she is now off of iron after iron/HGB levels normalized -Current treatment  Vitamin B12 2000 mcg daily - Appropriate, Query Effective -Medications previously tried: Niferex -Recommend to continue current medication  OAB (Goal: manage symptoms) -Controlled - pt reports higher dose of Myrbetriq is working reasonably well -Current treatment  Myrbetriq ER 50 mg daily - Appropriate, Effective, Safe, Accessible -Medications previously tried: n/a  -Recommend to continue current medication  Rheumatoid Arthritis (Goal: manage pain) -Controlled- pt reports Enbrel is a "miracle drug" for her, pain is much improved and she is in process of  tapering off prednisone -Pt reports leflunomide side effects: diarrhea, muscle pain, headaches, chills -Follows with Dr Dimple Casey [facility]) -Current treatment  Voltaren gel - Appropriate, Effective, Safe, Accessible Hydrocodone-APAP 5-325 mg TID PRN -Appropriate, Effective, Safe, Accessible Enbrel 50 mg weekly -Appropriate, Effective, Safe, Accessible Prednisone 10 mg daily - Appropriate, Effective, Safe, Accessible Leflunomide 20 mg daily - stopped after a few days -Medications previously tried: plaquenil, methotrexate; neuropathy- baclofen, gabepntin, pregabalin -Recommended to continue current medication; advised to follow up with rheumatology for next options  Postmenopausal symptoms (Goal: manage symptoms) -Not ideally controlled - pt stopped Premarin after last visit due to VTE risk; she has started supplements and reports they are not helping much with hot flashes but understands that it can take several months for effect -Current treatment  Black Cohosh - Appropriate, Query Effective, Safe, Accessible Rhubarb/Estroven -Appropriate, Query Effective, Safe, Accessible -Medications previously tried: Premarin -Discussed risk of VTE "clot" with hormone replacement; 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; furthermore pt has not been able to tolerate anticoagulation so would consider her risk even higher for developing a clot  -Recommend to continue current medication  Query Neuropathy -Pt reports pins/needles and pain when the shower water hits her skin and she dries off; she has stopped showering due to this and takes sponge-baths instead; she reports washing her hands under running water does not cause any pain -Pt has messaged PCP and Rheumatologist about this issue and they suspect neuropathy; pt has not tolerated gabapentin (100 mg HS) or pregabalin (75 mg BID) in the past due to dizziness/"passing out" feeling; she has not tolerated duloxetine  for same reason (dose unknown) -Pt most recently failed trial of lower-dose pregabalin due to swelling in R hand (June 2023)  Health Maintenance -Vaccine gaps: Shingrix #2 -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 Active MyChart status and patient understanding of how to access instructions and care plan via MyChart confirmed with patient.    Telephone follow up appointment with pharmacy team member scheduled for: 3 months  Charlene Brooke, PharmD, [facility] Clinical Pharmacist [facility] at [facility] [phone]

## 2021-11-09 ENCOUNTER — Ambulatory Visit
Admission: RE | Admit: 2021-11-09 | Discharge: 2021-11-09 | Disposition: A | Discharge status: Home / Self Care | Payer: Medicare Other | Source: Ambulatory Visit | Attending: Internal Medicine | Admitting: Internal Medicine

## 2021-11-09 DIAGNOSIS — M0600 Rheumatoid arthritis without rheumatoid factor, unspecified site: Secondary | ICD-10-CM

## 2021-11-09 DIAGNOSIS — R1310 Dysphagia, unspecified: Secondary | ICD-10-CM

## 2021-11-09 DIAGNOSIS — M542 Cervicalgia: Secondary | ICD-10-CM

## 2021-11-15 ENCOUNTER — Telehealth: Payer: Medicare Other

## 2021-11-16 NOTE — Progress Notes (Signed)
Neck MRI shows degenerative arthritis changes at multiple levels. There is some probable nerve impingement at some levels C4-C7 and worse on the left side. I do not think these changes are likely to be coming from her RA. She could benefit to see a spine specialist for further recommendations we can refer her if she is interested.

## 2021-11-22 ENCOUNTER — Telehealth: Payer: Self-pay

## 2021-11-22 NOTE — Telephone Encounter (Signed)
Advised patient that Dr. Benjamine Mola says that should be okay to get the vaccines. Advised her that her current medication should not affect risks or benefits of the vaccines, per Dr. Marveen Reeks note.

## 2021-11-22 NOTE — Telephone Encounter (Signed)
Patient called yesterday asking if it is okay for her to get the flu shot and Covid vaccine. Please advise, thanks!

## 2021-11-22 NOTE — Telephone Encounter (Signed)
That should be okay to get the vaccines. Her current medication should not affect risks or benefits of the vaccines.

## 2021-11-25 DIAGNOSIS — F319 Bipolar disorder, unspecified: Secondary | ICD-10-CM

## 2021-11-25 DIAGNOSIS — E785 Hyperlipidemia, unspecified: Secondary | ICD-10-CM

## 2021-11-25 DIAGNOSIS — J449 Chronic obstructive pulmonary disease, unspecified: Secondary | ICD-10-CM | POA: Diagnosis not present

## 2021-11-25 DIAGNOSIS — N1831 Chronic kidney disease, stage 3a: Secondary | ICD-10-CM | POA: Diagnosis not present

## 2021-11-25 DIAGNOSIS — E039 Hypothyroidism, unspecified: Secondary | ICD-10-CM

## 2021-11-25 DIAGNOSIS — I129 Hypertensive chronic kidney disease with stage 1 through stage 4 chronic kidney disease, or unspecified chronic kidney disease: Secondary | ICD-10-CM | POA: Diagnosis not present

## 2021-11-25 DIAGNOSIS — M059 Rheumatoid arthritis with rheumatoid factor, unspecified: Secondary | ICD-10-CM

## 2021-11-26 DIAGNOSIS — I459 Conduction disorder, unspecified: Secondary | ICD-10-CM

## 2021-11-26 HISTORY — DX: Conduction disorder, unspecified: I45.9

## 2021-11-27 ENCOUNTER — Telehealth: Payer: Self-pay | Admitting: Physical Medicine and Rehabilitation

## 2021-11-27 ENCOUNTER — Other Ambulatory Visit: Payer: Self-pay | Admitting: Physician Assistant

## 2021-11-27 DIAGNOSIS — M545 Low back pain, unspecified: Secondary | ICD-10-CM

## 2021-11-27 NOTE — Telephone Encounter (Signed)
Received call from patient. She needs her medical records from Dr, Ernestina Patches sent to [facility]. I accepted verbal authorization, Dr. Romona Curls records faxed to [phone], pts ph [phone]

## 2021-11-28 ENCOUNTER — Ambulatory Visit (INDEPENDENT_AMBULATORY_CARE_PROVIDER_SITE_OTHER): Payer: Medicare Other | Admitting: Family Medicine

## 2021-11-28 ENCOUNTER — Encounter: Payer: Self-pay | Admitting: Family Medicine

## 2021-11-28 VITALS — BP 132/74 | HR 80 | Temp 96.0°F | Ht 67.0 in | Wt 215.4 lb

## 2021-11-28 DIAGNOSIS — I10 Essential (primary) hypertension: Secondary | ICD-10-CM | POA: Diagnosis not present

## 2021-11-28 DIAGNOSIS — Z9181 History of falling: Secondary | ICD-10-CM

## 2021-11-28 DIAGNOSIS — M0600 Rheumatoid arthritis without rheumatoid factor, unspecified site: Secondary | ICD-10-CM

## 2021-11-28 DIAGNOSIS — K5520 Angiodysplasia of colon without hemorrhage: Secondary | ICD-10-CM | POA: Insufficient documentation

## 2021-11-28 DIAGNOSIS — G894 Chronic pain syndrome: Secondary | ICD-10-CM

## 2021-11-28 DIAGNOSIS — G8929 Other chronic pain: Secondary | ICD-10-CM

## 2021-11-28 DIAGNOSIS — M5441 Lumbago with sciatica, right side: Secondary | ICD-10-CM

## 2021-11-28 DIAGNOSIS — M8580 Other specified disorders of bone density and structure, unspecified site: Secondary | ICD-10-CM | POA: Diagnosis not present

## 2021-11-28 DIAGNOSIS — Z23 Encounter for immunization: Secondary | ICD-10-CM | POA: Diagnosis not present

## 2021-11-28 DIAGNOSIS — M5442 Lumbago with sciatica, left side: Secondary | ICD-10-CM

## 2021-11-28 DIAGNOSIS — Q2730 Arteriovenous malformation, site unspecified: Secondary | ICD-10-CM

## 2021-11-28 DIAGNOSIS — T380X5A Adverse effect of glucocorticoids and synthetic analogues, initial encounter: Secondary | ICD-10-CM

## 2021-11-28 NOTE — Assessment & Plan Note (Signed)
Continues f/u with Dr Benjamine Mola Continues 20 mg of prednisone for now  enbrel 50 mg weekly  Overall improved from the early days but still struggles with chronic pain from different sources

## 2021-11-28 NOTE — Patient Instructions (Signed)
Take care of yourself  Follow up with pain clinic and rheumatology   Keep up a good fluid intake   I will look into GI options  Flu shot today   I will see if I can run the disk to get your ER report from [place]   If you get headache or concussion symptoms let us know

## 2021-11-28 NOTE — Assessment & Plan Note (Signed)
bp in fair control at this time  BP Readings from Last 1 Encounters:  11/28/21 132/74   No changes needed Most recent labs reviewed  Disc lifstyle change with low sodium diet and exercise  Plan to continue metoprolol xl 12.5 mg daily  Pulse of 80

## 2021-11-28 NOTE — Assessment & Plan Note (Signed)
Most recent fall on L side with some abrasions on arms and hit head/hurt back  No concussion symptoms Back is still quite painful   Given disk with info from ER in [place] (imaging)  Will see if we can open that and review

## 2021-11-28 NOTE — Assessment & Plan Note (Addendum)
In colon with bleeding in the fall/ was hospitalized  Pt cannot get into [facility] due to their practice being too full She is stable currently  Needs to est with new GI practice Ref done

## 2021-11-28 NOTE — Assessment & Plan Note (Addendum)
Multifactorial triggers RA Severe OA DDD of spine Myofascial pain syndrome   In chronic pain clinic Getting some facet injections Tramadol is not working as well/per pt hydrocodone works better  Is also taking antidepressants and depakote

## 2021-11-28 NOTE — Progress Notes (Signed)
Subjective:    Patient ID: Lori Orozco, female    DOB: Mar 02, 1950, 71 y.o.   MRN: [id number]  HPI Pt presents for f/u of a fall   Wt Readings from Last 3 Encounters:  11/28/21 215 lb 6.4 oz (97.7 kg)  10/09/21 212 lb (96.2 kg)  08/02/21 196 lb (88.9 kg)   33.74 kg/m  Larey Seat in a hotel room   -in [place]  That was Friday  Larey Seat on her L side  Slid and hit her head against the wall  Twisted her sore back -now it is hurting more  Called 911 and went to ER in [place]   At first head hurt  Now better  No concussion symptoms   Did CT of back and head and neck  Xray hips and chest and L arm  No fractures  Has a worn out /herniated disk   She had to get a shot of morphine and the something else   Made it back to [place]  Has been in severe pain   Going to severe pain-going to pain clinic now  Did a medial branch nerve block-did not work  (for facet arthropathy)  Will try another one  ? Considering nerve ablation   Several procedures first  Then possibly a nerve stimulator   Lower lumbar pain and muscles  Makes her sick  Tramadol is not touching it  Hydrocodone is better- she called and told them today  Tylenol right now  Has tens  Has voltaren gel  Asper cream    Her RA pain is a little better controlled  On 20 mg of prednisone for now  Gaetano [place]- had reaction      Has rheumatology appt 10/18 Dr Dimple Casey  Still a lot of neuropathy symptoms   Lab Results  Component Value Date   HGBA1C 5.4 07/11/2021     BP Readings from Last 3 Encounters:  11/28/21 132/74  10/09/21 137/79  08/17/21 (!) 162/79    Pulse Readings from Last 3 Encounters:  11/28/21 80  10/09/21 74  08/17/21 74   Status of pt with GI: for avm colon and bleeding in the past   Request received to transfer GI care from outside practice to [facility].  We appreciate the interest in our practice, however at this time due to capacity and high demand from patients without established GI providers we  cannot accommodate this transfer.  Ability to accommodate future transfer requests may change over time and the patient can contact us again in 6-12 months if still interested in being seen at [facility].    According to pt-is not a transfer , just saw someone outside the hospital (emergency)   Interested in a different GI practice in [facility]  Lab Results  Component Value Date   WBC 9.7 10/09/2021   HGB 12.2 10/09/2021   HCT 37.7 10/09/2021   MCV 99.0 10/09/2021   PLT 236 10/09/2021   Colonoscopy was last nov with EGD in hosp at [facility]  She was hosp for GI bleed last November and has stabilized  Wants to get est with someone    Patient Active Problem List   Diagnosis Date Noted   Chronic pain 11/28/2021   Bilateral leg weakness 10/09/2021   Vitamin B12 deficiency 08/02/2021   Myofascial pain dysfunction syndrome 08/02/2021   Adverse effect of prednisone 07/11/2021   Prediabetes 07/11/2021   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   History of pulmonary embolism 12/08/2020   Arthritis of  knee 12/06/2020   Bilateral knee pain 09/20/2020   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   High risk medication use 09/20/2020   Extensor tenosynovitis of right wrist 09/15/2020   S/P reverse total shoulder arthroplasty, 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   Lung nodule 01/02/2020   COVID-19 03/31/2019   Status post arthroscopy of left shoulder 01/16/2018   Estrogen deficiency 06/28/2017   Medial epicondylitis, left elbow 04/29/2017   S/P arthroscopy of left shoulder 02/25/2017   Impingement syndrome of left shoulder 01/30/2017   Pedal edema 10/26/2016   Trochanteric bursitis, right hip 04/10/2016   Essential hypertension 09/02/2015   Urge incontinence 07/27/2015   Obesity 04/28/2015   Screening for HIV (human immunodeficiency virus) 04/26/2015   Osteoarthritis of left knee 01/28/2015   Status post total left knee replacement 01/28/2015   Vitamin D deficiency 04/27/2014   Seasonal and  perennial allergic rhinitis 06/17/2013   S/P total hip arthroplasty 03/20/2013   Medicare annual wellness visit, subsequent 11/18/2012   History of falling 11/18/2012   Routine general medical examination at a health care facility 11/10/2012   Lichen sclerosus et atrophicus of the vulva 07/29/2012   Osteopenia 10/19/2011   Hypothyroidism 10/19/2011   ANEMIA 10/11/2009   Hereditary and idiopathic peripheral neuropathy 08/05/2009   Low back pain 08/05/2009   HAND PAIN, BILATERAL 08/05/2009   TREMOR 03/09/2008   MENOPAUSAL SYNDROME 10/09/2007   DEPRESSION, MAJOR 05/20/2007   BIPOLAR AFFECTIVE DISORDER 05/20/2007   Generalized anxiety disorder 05/20/2007   PERSONALITY DISORDER 05/20/2007   ESOPHAGEAL SPASM 05/20/2007   Diaphragmatic hernia 05/20/2007   AMAUROSIS FUGAX 05/07/2007   COPD mixed type (HCC) 03/27/2007   HYPERCHOLESTEROLEMIA, PURE 12/10/2006   SYMPTOM, SYNDROME, CHRONIC FATIGUE 12/10/2006   Past Medical History:  Diagnosis Date   Amaurosis fugax    Anemia    hx   Anxiety    Arthritis    Asthma    Cataract    Chronic fatigue syndrome    Chronic kidney disease    frequency, nephritis when 71 yrs old   COPD (chronic obstructive pulmonary disease) (HCC)    COVID-19    Depression    Diverticulitis    Emphysema of lung (HCC)    Fibromyalgia    GERD (gastroesophageal reflux disease)    occ   H/O hiatal hernia    History of bronchitis    Hyperlipidemia    Hypothyroidism    Interstitial cystitis    Irritable bowel syndrome    Lichen sclerosus    Lumbar herniated disc    Migraines    Pneumonia    PONV (postoperative nausea and vomiting)    Shortness of breath    exertion    Thyroid disease    Graves   Urinary frequency    Urinary tract infection    Vertigo    Past Surgical History:  Procedure Laterality Date   ABDOMINAL HYSTERECTOMY     bladder sugery      CAROTID DOPPLAR     COLONOSCOPY  05/26/2010   avms- otherwise nl , re check 10y    DEXA-OSTEOPENIA     DOPPLER ECHOCARDIOGRAPHY     elbow surgery     EPICONDYLITIS     EYE SURGERY Bilateral    cataracts   FOOT SURGERY Bilateral    I & D EXTREMITY Right 09/01/2020   Procedure: TYNOSYNOVECTOMY;  Surgeon: [name], MD;  Location: [facility] OR;  Service: Orthopedics;  Laterality: Right;   KNEE SURGERY     Left cartilage  lipoma in second finger right hand     PLANTAR FASCIA SURGERY Left    REVERSE SHOULDER ARTHROPLASTY Left 02/02/2020   Procedure: LEFT REVERSE SHOULDER ARTHROPLASTY;  Surgeon: [name], MD;  Location: [facility] OR;  Service: Orthopedics;  Laterality: Left;   ROTATOR CUFF REPAIR Left 12/2017   TEMPOROMANDIBULAR JOINT SURGERY     TONSILLECTOMY     TOTAL HIP ARTHROPLASTY Right 03/20/2013   Procedure: Right TOTAL HIP ARTHROPLASTY;  Surgeon: [name], MD;  Location: [facility] OR;  Service: Orthopedics;  Laterality: Right;  Right Total Hip Arthroplasty   TOTAL KNEE ARTHROPLASTY Left 01/28/2015   Procedure: LEFT TOTAL KNEE ARTHROPLASTY;  Surgeon: [name], MD;  Location: [facility] ORS;  Service: Orthopedics;  Laterality: Left;   TRIGGER FINGER RELEASE Right    TROCHANTERIC BURSA EXCISION Right 05/03/2016   Dr. [name], [name]   WRIST ARTHROSCOPY Right    ligament tear   Social History   Tobacco Use   Smoking status: Former    Packs/day: 2.00    Years: 40.00    Total pack years: 80.00    Types: E-cigarettes, Cigarettes    Quit date: 2015    Years since quitting: 8.7   Smokeless tobacco: Never  Vaping Use   Vaping Use: Former   Quit date: 11/26/2020   Devices: no nicotine in it  Substance Use Topics   Alcohol use: No    Alcohol/week: 0.0 standard drinks of alcohol   Drug use: No   Family History  Problem Relation Age of Onset   Arthritis Mother    Hypertension Mother    Kidney disease Mother    Cancer Father        Bladder   Arthritis Sister    Colon cancer Other    Cancer - Lung Cousin    Arthritis Maternal Grandmother     Allergies  Allergen Reactions   Lithium Anaphylaxis   Tegretol [Carbamazepine] Other (See Comments)    Fever and body aches (fever over 103)   Tricyclic Antidepressants Anaphylaxis    Other reaction(s): Unknown   Methotrexate Derivatives Other (See Comments)    Urinary retention and dizziness  Other reaction(s): Other UTI   Strawberry Extract Hives   Codeine Nausea Only    Makes pt stay awake   Cymbalta [Duloxetine Hcl] Other (See Comments)    Makes pt pass out    Erythromycin     abdominal pain   Lyrica [Pregabalin]     Felt faint   Neurontin [Gabapentin]     Passes  out   Rabeprazole Sodium     insomnia   Duraprep [Antiseptic Products, Misc.] Itching and Rash    Tolerates Betadine    Penicillins Rash    Tolerated ANCEF 02/02/20  Has patient had a PCN reaction causing immediate rash, facial/tongue/throat swelling, SOB or lightheadedness with hypotension: Yes Has patient had a PCN reaction causing severe rash involving mucus membranes or skin necrosis: No Has patient had a PCN reaction that required hospitalization No Has patient had a PCN reaction occurring within the last 10 years: No If all of the above answers are "NO", then may proceed with Cephalosporin use.    Tape Rash    PT ALLERGIC NYLON TAPE    Current Outpatient Medications on File Prior to Visit  Medication Sig Dispense Refill   albuterol (VENTOLIN HFA) 108 (90 Base) MCG/ACT inhaler Inhale 1-2 puffs into the lungs every 4 (four) hours as needed for wheezing or shortness of breath. 1  each 0   Black Cohosh 40 MG CAPS Take 1 capsule (40 mg total) by mouth daily. 30 capsule 11   budesonide-formoterol (SYMBICORT) 160-4.5 MCG/ACT inhaler Inhale 2 puffs into the lungs 2 (two) times daily. 3 each 3   buPROPion (WELLBUTRIN XL) 150 MG 24 hr tablet Take 150 mg by mouth daily.     busPIRone (BUSPAR) 15 MG tablet Take 15 mg by mouth 2 (two) times daily before a meal.     cholecalciferol (VITAMIN D) 25 MCG (1000  UNIT) tablet Take 2,000 Units by mouth daily. 2 tablets daily     clobetasol cream (TEMOVATE) 0.05 % APPLY SMALL AMOUNT TO AFFECTED AREAS TWO TO THREE TIMES PER WEEK 30 g 4   cyanocobalamin 2000 MCG tablet Take 1,000 mcg by mouth daily.     diazepam (VALIUM) 5 MG/ML solution Take by mouth every 8 (eight) hours as needed for anxiety.     diclofenac Sodium (VOLTAREN) 1 % GEL Apply 2 g topically 2 (two) times daily as needed (pain).     diphenhydrAMINE (BENADRYL) 25 MG tablet Take 25 mg by mouth every 6 (six) hours as needed for allergies.     divalproex (DEPAKOTE ER) 500 MG 24 hr tablet Take 500 mg by mouth at bedtime.      etanercept (ENBREL SURECLICK) 50 MG/ML injection Inject 50 mg into the skin once a week. 12 mL 0   fluticasone (FLONASE) 50 MCG/ACT nasal spray PLACE 2 SPRAYS INTO THE NOSE DAILY. (Patient taking differently: Place 2 sprays into both nostrils. PLACE 2 SPRAYS INTO THE NOSE DAILY.) 16 g 11   HYDROcodone-acetaminophen (NORCO/VICODIN) 5-325 MG tablet Take 1 tablet by mouth 3 (three) times daily as needed for moderate pain. 30 tablet 0   ipratropium-albuterol (DUONEB) 0.5-2.5 (3) MG/3ML SOLN Take 3 mLs by nebulization every 6 (six) hours as needed. 120 mL prn   ketotifen (ZADITOR) 0.025 % ophthalmic solution Place 1 drop into both eyes 2 (two) times daily as needed (allergies).     levothyroxine (SYNTHROID) 50 MCG tablet Take 1 tablet (50 mcg total) by mouth at bedtime. 90 tablet 1   LORazepam (ATIVAN) 1 MG tablet Take 1 mg by mouth at bedtime as needed for anxiety or sleep.     metoprolol succinate (TOPROL-XL) 25 MG 24 hr tablet Take 0.5 tablets (12.5 mg total) by mouth in the morning and at bedtime. 90 tablet 3   mirabegron ER (MYRBETRIQ) 50 MG TB24 tablet Take 1 tablet (50 mg total) by mouth daily. 190 tablet 3   mirtazapine (REMERON) 30 MG tablet Take 30 mg by mouth at bedtime.      ondansetron (ZOFRAN-ODT) 4 MG disintegrating tablet Take 1 tablet (4 mg total) by mouth every 8  (eight) hours as needed for nausea or vomiting. 20 tablet 1   predniSONE (DELTASONE) 10 MG tablet TAKE ONE TABLET BY MOUTH DAILY AS DIRECTED 30 tablet 2   Rhubarb (ESTROVEN COMPLETE) 4 MG TABS Take 4 mg by mouth daily. 30 tablet 11   sertraline (ZOLOFT) 100 MG tablet Take 100 mg by mouth daily.      simvastatin (ZOCOR) 20 MG tablet TAKE ONE TABLET BY MOUTH EVERY NIGHT AT BEDTIME 90 tablet 3   Spacer/Aero-Holding Chambers (AEROCHAMBER PLUS) inhaler Use with inhaler 1 each 2   traMADol (ULTRAM) 50 MG tablet Take 50 mg by mouth every 6 (six) hours as needed.     No current facility-administered medications on file prior to visit.    Review of  Systems  Constitutional:  Negative for activity change, appetite change, fatigue, fever and unexpected weight change.  HENT:  Negative for congestion, ear pain, rhinorrhea, sinus pressure and sore throat.   Eyes:  Negative for pain, redness and visual disturbance.  Respiratory:  Negative for cough, shortness of breath and wheezing.   Cardiovascular:  Negative for chest pain and palpitations.  Gastrointestinal:  Negative for abdominal pain, blood in stool, constipation and diarrhea.  Endocrine: Negative for polydipsia and polyuria.  Genitourinary:  Negative for dysuria, frequency and urgency.  Musculoskeletal:  Positive for arthralgias, back pain, gait problem, joint swelling, myalgias and neck pain.  Skin:  Negative for pallor and rash.  Allergic/Immunologic: Negative for environmental allergies.  Neurological:  Positive for numbness. Negative for dizziness, syncope and headaches.  Hematological:  Negative for adenopathy. Does not bruise/bleed easily.  Psychiatric/Behavioral:  Positive for dysphoric mood. Negative for decreased concentration. The patient is nervous/anxious.        Objective:   Physical Exam Constitutional:      General: She is not in acute distress.    Appearance: Normal appearance. She is well-developed. She is obese. She is not  ill-appearing or diaphoretic.  HENT:     Head: Normocephalic and atraumatic.     Mouth/Throat:     Mouth: Mucous membranes are moist.  Eyes:     General: No scleral icterus.       Right eye: No discharge.        Left eye: No discharge.     Conjunctiva/sclera: Conjunctivae normal.     Pupils: Pupils are equal, round, and reactive to light.  Neck:     Thyroid: No thyromegaly.     Vascular: No carotid bruit or JVD.  Cardiovascular:     Rate and Rhythm: Normal rate and regular rhythm.     Heart sounds: Normal heart sounds.     No gallop.  Pulmonary:     Effort: Pulmonary effort is normal. No respiratory distress.     Breath sounds: Normal breath sounds. No wheezing or rales.     Comments: Diffusely distant bs Some wheeze on forced expiration  Abdominal:     General: There is no distension or abdominal bruit.     Palpations: Abdomen is soft.  Musculoskeletal:     Cervical back: Neck supple.     Right lower leg: No edema.     Left lower leg: No edema.     Comments: Limited rom of spine (cervical to lumbar)  Needs some assist with ambulation   Changes of RA in hands/ mcps   Lymphadenopathy:     Cervical: No cervical adenopathy.  Skin:    General: Skin is warm and dry.     Coloration: Skin is not pale.     Findings: No rash.  Neurological:     Mental Status: She is alert.     Cranial Nerves: No cranial nerve deficit.     Coordination: Coordination normal.     Deep Tendon Reflexes: Reflexes are normal and symmetric. Reflexes normal.  Psychiatric:        Mood and Affect: Mood is anxious.           Assessment & Plan:   Problem List Items Addressed This Visit       Cardiovascular and Mediastinum   Arteriovenous malformation (AVM)    In colon with bleeding in the fall/ was hospitalized  Pt cannot get into [facility] due to their practice being too full She is stable currently  Needs to est with new GI practice Ref done       Essential hypertension    bp in fair control  at this time  BP Readings from Last 1 Encounters:  11/28/21 132/74  No changes needed Most recent labs reviewed  Disc lifstyle change with low sodium diet and exercise  Plan to continue metoprolol xl 12.5 mg daily  Pulse of 80        Musculoskeletal and Integument   Osteopenia    No fractures with recent fall thankfully      Seronegative rheumatoid arthritis (HCC)    Continues f/u with Dr Dimple Casey Continues 20 mg of prednisone for now  enbrel 50 mg weekly  Overall improved from the early days but still struggles with chronic pain from different sources       Relevant Medications   traMADol (ULTRAM) 50 MG tablet     Other   Adverse effect of prednisone    Wt gain noted  Cannot go w/o it   When well enough will need to follow dexa  She is not able to do it now in light of positioning      Chronic pain    Multifactorial triggers RA Severe OA DDD of spine Myofascial pain syndrome   In chronic pain clinic Getting some facet injections Tramadol is not working as well/per pt hydrocodone works better  Is also taking antidepressants and depakote       Relevant Medications   traMADol (ULTRAM) 50 MG tablet   History of falling - Primary    Most recent fall on L side with some abrasions on arms and hit head/hurt back  No concussion symptoms Back is still quite painful   Given disk with info from ER in [place] (imaging)  Will see if we can open that and review      Low back pain    Pt struggles with  Severe facet arthropathy DDD with radiculopathy  Pain is worse today from recent fall/contusions (no fx on xray and plan to review the disc)  Seen at pain clinic Trying various injections  If helpful may investigate a nerve stimulator       Relevant Medications   traMADol (ULTRAM) 50 MG tablet   Other Visit Diagnoses     Need for immunization against influenza       Relevant Orders   Flu Vaccine QUAD 92mo+IM (Fluarix, Fluzone & Alfiuria Quad PF) (Completed)

## 2021-11-28 NOTE — Assessment & Plan Note (Signed)
Wt gain noted  Cannot go w/o it   When well enough will need to follow dexa  She is not able to do it now in light of positioning

## 2021-11-28 NOTE — Assessment & Plan Note (Signed)
Pt struggles with  Severe facet arthropathy DDD with radiculopathy  Pain is worse today from recent fall/contusions (no fx on xray and plan to review the disc)  Seen at pain clinic Trying various injections  If helpful may investigate a nerve stimulator

## 2021-11-28 NOTE — Assessment & Plan Note (Signed)
No fractures with recent fall thankfully

## 2021-11-29 ENCOUNTER — Telehealth: Payer: Self-pay | Admitting: Family Medicine

## 2021-11-29 ENCOUNTER — Other Ambulatory Visit: Payer: Self-pay | Admitting: Family Medicine

## 2021-11-29 DIAGNOSIS — Z1231 Encounter for screening mammogram for malignant neoplasm of breast: Secondary | ICD-10-CM

## 2021-11-29 NOTE — Telephone Encounter (Signed)
Reuben Likes from [facility] called in reference to a referral that was made for pt. She stated the pt has mentioned she has seen two different previous GI drs & in the referral notes it states the pt didn't want to return to [facility] due to office being too full. Reuben Likes stated they're booked out until next yr. Reuben Likes stated In order for Dr. Paulita Fujita to accept the toc, [facility] will need all of pt's records from the two previous GI drs the pt has seen before. Call back # [phone]

## 2021-12-05 ENCOUNTER — Telehealth: Payer: Self-pay | Admitting: Family Medicine

## 2021-12-05 NOTE — Telephone Encounter (Signed)
Please let pt know that despite trying I do not have a way to open the disc she gave my from her hospital stay in [place]   We will need to send to the hospital to send [place] records on paper So sorry about that   She will need to sign a release I put the disc in the In box to get back to her

## 2021-12-07 ENCOUNTER — Telehealth: Payer: Self-pay | Admitting: Physician Assistant

## 2021-12-07 ENCOUNTER — Other Ambulatory Visit: Payer: Self-pay | Admitting: Orthopaedic Surgery

## 2021-12-07 MED ORDER — HYDROCODONE-ACETAMINOPHEN 5-325 MG PO TABS: 1.0000 | ORAL_TABLET | Freq: Three times a day (TID) | ORAL | 0 refills | Status: DC | PRN

## 2021-12-07 NOTE — Telephone Encounter (Signed)
Pt called and needs refill on hydrocodone °

## 2021-12-07 NOTE — Telephone Encounter (Signed)
Please advise 

## 2021-12-07 NOTE — Progress Notes (Deleted)
Office Visit Note  Patient: Lori Orozco             Date of Birth: 09-18-1950           MRN: [id number]             PCP: Lori Greenspan, MD Referring: Tower, Wynelle Fanny, MD Visit Date: 12/13/2021   Subjective:  No chief complaint on file.   History of Present Illness: Lori Orozco is a 71 y.o. female here for follow up for seronegative inflammatory arthritis    Previous HPI 10/09/2021 Lori Orozco is a 71 y.o. female here for follow up for seronegative inflammatory arthritis on Enbrel 50 mg Bennett weekly and prednisone 10 mg daily. She tried tapering prednisone but had severe worsening symptoms at 5 mg dose. She increased back to 10 mg daily and symptoms improved somewhat but still a lot of pain and swelling in both hands and wrists. She has ongoing back pain and stiffness and is seeing Lori Orozco for this with known significant lumbar spine arthritis. More recently new problem with sensitivity and stinging type pain throughout both arms with even light pressure such as showering water. She also has worsening weakness in her proximal legs unable to stand without using arms, which are limited by pain.     Previous HPI 07/05/2021 Lori Orozco is a 71 y.o. female here for follow up for seronegative inflammatory arthritis on prednisone 20 mg daily dose and enbrel 74m subcutaneous once weekly. She has seen a large improvement in joint inflammation since starting the Enbrel. She has pain and redness around the injection site lasting up to 3 days with each dose. Wrists continue to hurt and have decreased movement left wrist actually more symptomatic than right now. She is working with physical therapy on getting back to walking currently very weak and unstable due to illness and deconditioning. She had recent ED visit for COPD exacerbation finished antibiotics for this. No other recent infection issues.   Previous HPI 02/08/2021 Lori Orozco a 71y.o. female here  for follow up with seronegative inflammatory arthritis on prednisone taper at 15 mg daily dose.  She has had major medical events since her last visit.  She developed increased knee pain and swelling on the right side.  However she went to the hospital due to developing more weakness and shortness of breath CT angiogram identified bilateral pulmonary emboli.  Treatment with anticoagulation led to significant gastrointestinal bleeding from previously unknown AVM.  This was treated also in the hospital but overall protracted complications lasted for about a month hospitalization.  During this time she experienced worsening joint pain and swelling and significant deconditioning and weakness.  She was discharged from the hospital to rehab facility she was not continued on any steroid or other anti-inflammatory treatment reports joint pain and swelling affecting numerous joints in bilateral elbows wrists fingers and knees.  This is limited any significant progress with physical therapy so far.  She called back to clinic earlier this month with the symptoms and was restarted on a prednisone taper currently down to 15 mg daily.  She saw orthopedics clinic for right knee aspiration injection 2 days ago that is partially helping.   Previous HPI 11/15/20 Lori DINIZis a 71y.o. female here for follow up with wrist wrist extensor tenosynovitis and presumed seronegative inflammatory arthritis also involving knee joints after starting methotrexate 15 mg PO weekly and tapering prednisone off. Her wrist is  slightly improved but remains very painful and swollen with decreased mobility. After starting methotrexate she has noticed some episodes of dizziness and urinary retention and frequency. She stopped the prednisone without noticing much difference in symptoms so far.     09/20/20 Lori Orozco is a 71 y.o. female here for joint pain and swelling of knees and wrists with severe tenosynovitis s/p  tenosynovectomy on 09/01/20 with Dr. Ninfa Linden.  She had been feeling in her usual health until June she recalls onset of symptoms after using a gas leaf blower at her home during which time she experienced some mild right wrist pain.  She does not recall any particular injury event at this time.  However she experienced progressive ongoing increase in pain with swelling and stiffness in her right wrist especially with decreased range of motion and pain over the dorsal aspect.  This is evaluated at orthopedics clinic with aspiration that revealed just coagulated sample trial of steroid medication and only partially improved symptoms then continued worsening.  She was admitted to the hospital for tenosynovectomy that was performed without complication.  Intraoperatively reported extensive inflammatory tissue debridement without any evidence concerning for infection.  During this hospitalization she also developed knee effusion which was aspirated demonstrating inflammatory synovial fluid with negative microscopy and cultures.  Is now almost 3 weeks since her surgery she continues experiencing severe pain intensity swelling around the right hand and wrist and has developed some skin blistering.     Labs reviewed 08/2020 Synovial fluid knee 4,525 WBCs 90% neutrophils ANA neg RF 14.2 CCP neg ESR 37 CRP 14.1 Uric acid 4.9   07/2020 Synovial fluid wrist clotted sample negative gram stain   No Rheumatology ROS completed.   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   Bilateral leg weakness 10/09/2021   Vitamin B12 deficiency 08/02/2021   Myofascial pain dysfunction syndrome 08/02/2021   Adverse effect of prednisone 07/11/2021   Prediabetes 07/11/2021   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   History of pulmonary embolism 12/08/2020   Arthritis of knee 12/06/2020   Bilateral knee pain  09/20/2020   Seronegative rheumatoid arthritis ([place]) 09/20/2020   High risk medication use 09/20/2020   Extensor tenosynovitis of right wrist 09/15/2020   S/P reverse total shoulder arthroplasty, 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   Lung nodule 01/02/2020   Status post arthroscopy of left shoulder 01/16/2018   Estrogen deficiency 06/28/2017   Medial epicondylitis, left elbow 04/29/2017   S/P arthroscopy of left shoulder 02/25/2017   Impingement syndrome of left shoulder 01/30/2017   Pedal edema 10/26/2016   Trochanteric bursitis, right hip 04/10/2016   Essential hypertension 09/02/2015   Urge incontinence 07/27/2015   Obesity 04/28/2015   Screening for HIV (human immunodeficiency virus) 04/26/2015   Osteoarthritis of left knee 01/28/2015   Status post total left knee replacement 01/28/2015   Vitamin D deficiency 04/27/2014   Seasonal and perennial allergic rhinitis 06/17/2013   S/P total hip arthroplasty 03/20/2013   Medicare annual wellness visit, subsequent 11/18/2012   History of falling 11/18/2012   Routine general medical examination at a health care facility 86/75/4492   Lichen sclerosus et atrophicus of the vulva 07/29/2012   Osteopenia 10/19/2011   Hypothyroidism 10/19/2011   ANEMIA 10/11/2009   Hereditary and idiopathic peripheral neuropathy 08/05/2009   Low back pain 08/05/2009   HAND PAIN, BILATERAL 08/05/2009   TREMOR 03/09/2008   MENOPAUSAL SYNDROME 10/09/2007  DEPRESSION, MAJOR 05/20/2007   BIPOLAR AFFECTIVE DISORDER 05/20/2007   Generalized anxiety disorder 05/20/2007   PERSONALITY DISORDER 05/20/2007   ESOPHAGEAL SPASM 05/20/2007   Diaphragmatic hernia 05/20/2007   AMAUROSIS FUGAX 05/07/2007   COPD mixed type ([place]) 03/27/2007   HYPERCHOLESTEROLEMIA, PURE 12/10/2006   SYMPTOM, SYNDROME, CHRONIC FATIGUE 12/10/2006    Past Medical History:  Diagnosis Date   Amaurosis fugax    Anemia    hx   Anxiety    Arthritis    Asthma    Cataract    Chronic fatigue syndrome     Chronic kidney disease    frequency, nephritis when 71 yrs old   COPD (chronic obstructive pulmonary disease) ([place])    COVID-19    Depression    Diverticulitis    Emphysema of lung ([facility])    Fibromyalgia    GERD (gastroesophageal reflux disease)    occ   H/O hiatal hernia    History of bronchitis    Hyperlipidemia    Hypothyroidism    Interstitial cystitis    Irritable bowel syndrome    Lichen sclerosus    Lumbar herniated disc    Migraines    Pneumonia    PONV (postoperative nausea and vomiting)    Shortness of breath    exertion    Thyroid disease    Gra[place]   Urinary frequency    Urinary tract infection    Vertigo     Family History  Problem Relation Age of Onset   Arthritis Mother    Hypertension Mother    Kidney disease Mother    Cancer Father        Bladder   Arthritis Sister    Colon cancer Other    Cancer - Lung Cousin    Arthritis Maternal Grandmother    Past Surgical History:  Procedure Laterality Date   ABDOMINAL HYSTERECTOMY     bladder sugery      CAROTID DOPPLAR     COLONOSCOPY  05/26/2010   avms- otherwise nl , re check 10y   DEXA-OSTEOPENIA     DOPPLER ECHOCARDIOGRAPHY     elbow surgery     EPICONDYLITIS     EYE SURGERY Bilateral    cataracts   FOOT SURGERY Bilateral    I & D EXTREMITY Right 09/01/2020   Procedure: TYNOSYNOVECTOMY;  Surgeon: Mcarthur Rossetti, MD;  Location: [facility];  Service: Orthopedics;  Laterality: Right;   KNEE SURGERY     Left cartilage   lipoma in second finger right hand     PLANTAR FASCIA SURGERY Left    REVERSE SHOULDER ARTHROPLASTY Left 02/02/2020   Procedure: LEFT REVERSE SHOULDER ARTHROPLASTY;  Surgeon: Meredith Pel, MD;  Location: [facility];  Service: Orthopedics;  Laterality: Left;   ROTATOR CUFF REPAIR Left 12/2017   TEMPOROMANDIBULAR JOINT SURGERY     TONSILLECTOMY     TOTAL HIP ARTHROPLASTY Right 03/20/2013   Procedure: Right TOTAL HIP ARTHROPLASTY;  Surgeon: Newt Minion, MD;  Location: [facility];   Service: Orthopedics;  Laterality: Right;  Right Total Hip Arthroplasty   TOTAL KNEE ARTHROPLASTY Left 01/28/2015   Procedure: LEFT TOTAL KNEE ARTHROPLASTY;  Surgeon: Mcarthur Rossetti, MD;  Location: [facility];  Service: Orthopedics;  Laterality: Left;   TRIGGER FINGER RELEASE Right    TROCHANTERIC BURSA EXCISION Right 05/03/2016   Dr. Jean Rosenthal   WRIST ARTHROSCOPY Right    ligament tear   Social History   Social History Narrative   Not on file  Immunization History  Administered Date(s) Administered   Influenza Split 11/19/2011, 11/02/2013   Influenza Whole 12/20/2006, 11/25/2008, 11/26/2009, 11/24/2010   Influenza, High Dose Seasonal PF 12/13/2016   Influenza,inj,Quad PF,6+ Mos 12/06/2015, 11/28/2021   Influenza-Unspecified 12/08/2012, 12/03/2014, 12/08/2017, 11/10/2018, 10/31/2019   Moderna Sars-Covid-2 Vaccination 06/21/2019, 07/19/2019, 01/22/2020   PFIZER(Purple Top)SARS-COV-2 Vaccination 06/04/2020   Pneumococcal Conjugate-13 12/06/2015   Pneumococcal Polysaccharide-23 01/26/2002, 03/23/2008, 09/07/2010, 06/28/2017   Td 06/26/2000, 10/19/2011   Zoster Recombinat (Shingrix) 09/16/2017     Objective: Vital Signs: There were no vitals taken for this visit.   Physical Exam   Musculoskeletal Exam: ***  CDAI Exam: CDAI Score: -- Patient Global: --; Provider Global: -- Swollen: --; Tender: -- Joint Exam 12/13/2021   No joint exam has been documented for this visit   There is currently no information documented on the homunculus. Go to the Rheumatology activity and complete the homunculus joint exam.  Investigation: No additional findings.  Imaging: MR CERVICAL SPINE WO CONTRAST  Result Date: 11/10/2021 CLINICAL DATA:  Neck pain radiating to bilateral shoulders and hands. EXAM: MRI CERVICAL SPINE WITHOUT CONTRAST TECHNIQUE: Multiplanar, multisequence MR imaging of the cervical spine was performed. No intravenous contrast was administered. COMPARISON:   None Available. FINDINGS: Alignment: Physiologic. Vertebrae: No fracture, evidence of discitis, or bone lesion. Cord: Normal signal and morphology. Posterior Fossa, vertebral arteries, paraspinal tissues: T2/STIR hyperintense signal within the pons on the right, nonspecific, and possibly related to chronic microvascular ischemic change. Incidentally noted is a partially empty sella. Disc levels: C1-C2: No significant degenerative change. C2-C3: Mild bilateral facet degenerative change. No spinal canal or neural foraminal stenosis. C3-C4: Minimal disc bulge mild bilateral facet degenerative change. Mild bilateral neural foraminal stenosis. No spinal canal stenosis. C4-C5: Mild bilateral facet degenerative change. Minimal disc bulge. Mild spinal canal stenosis. Moderate left and mild right neural foraminal stenosis. Uncovertebral hypertrophy. C5-C6: Moderate left and mild right facet degenerative change. Circumferential disc bulge. Mild spinal canal stenosis uncovertebral hypertrophy. Moderate to severe left and mild right neural foraminal stenosis. C6-C7: Moderate bilateral facet degenerative change. Circumferential disc bulge. Uncovertebral hypertrophy. Severe left and mild right neural foraminal stenosis. C7-T1: No significant neural foraminal or spinal canal stenosis. IMPRESSION: Multilevel degenerative disc and facet disease results in mild spinal canal stenosis at C4-C5 and C5-C6 and moderate to severe neural foraminal stenosis at C5-C6 (left), and C6-C7 (left). Electronically Signed   By: Marin Roberts M.D.   On: 11/10/2021 13:49    Recent Labs: Lab Results  Component Value Date   WBC 9.7 10/09/2021   HGB 12.2 10/09/2021   PLT 236 10/09/2021   NA 141 10/09/2021   K 4.8 10/09/2021   CL 103 10/09/2021   CO2 24 10/09/2021   GLUCOSE 96 10/09/2021   BUN 25 10/09/2021   CREATININE 1.24 (H) 10/09/2021   BILITOT 0.6 10/09/2021   ALKPHOS 57 07/11/2021   AST 20 10/09/2021   ALT 18 10/09/2021   PROT 6.7  10/09/2021   ALBUMIN 3.8 07/11/2021   CALCIUM 9.5 10/09/2021   GFRAA >60 01/29/2015   QFTBGOLDPLUS NEGATIVE 10/09/2021    Speciality Comments: No specialty comments available.  Procedures:  No procedures performed Allergies: Lithium; Tegretol [carbamazepine]; Tricyclic antidepressants; Methotrexate derivatives; Strawberry extract; Codeine; Cymbalta [duloxetine hcl]; Erythromycin; Lyrica [pregabalin]; Neurontin [gabapentin]; Rabeprazole sodium; Duraprep [antiseptic products, misc.]; Penicillins; and Tape   Assessment / Plan:     Visit Diagnoses: No diagnosis found.  ***  Orders: No orders of the defined types were placed in this encounter.  No orders of the defined types were placed in this encounter.    Follow-Up Instructions: No follow-ups on file.   Bertram Savin, RT  Note - This record has been created using Editor, commissioning.  Chart creation errors have been sought, but may not always  have been located. Such creation errors do not reflect on  the standard of medical care.

## 2021-12-08 ENCOUNTER — Ambulatory Visit
Admission: RE | Admit: 2021-12-08 | Discharge: 2021-12-08 | Disposition: A | Discharge status: Home / Self Care | Payer: Medicare Other | Source: Ambulatory Visit | Attending: Family Medicine | Admitting: Family Medicine

## 2021-12-08 ENCOUNTER — Telehealth: Payer: Self-pay

## 2021-12-08 ENCOUNTER — Other Ambulatory Visit: Payer: Self-pay | Admitting: Family Medicine

## 2021-12-08 DIAGNOSIS — M542 Cervicalgia: Secondary | ICD-10-CM

## 2021-12-08 DIAGNOSIS — G4489 Other headache syndrome: Secondary | ICD-10-CM

## 2021-12-08 DIAGNOSIS — M5440 Lumbago with sciatica, unspecified side: Secondary | ICD-10-CM

## 2021-12-08 NOTE — Telephone Encounter (Signed)
Lori Orozco dropped off a CD of CT head, lumbar spine, and cervical spine.  These have been imported to our system and are able to be viewed.  I called patient and notified her that her CD is ready for her to pick up at her convenience.  The CD has been placed in the front office with the outgoing CDs.

## 2021-12-08 NOTE — Telephone Encounter (Signed)
Referral was faxed 11/29/2021 to Dr Paulita Fujita at [facility]  Notification    Delivery method: Clinical Attachment  Fax number: [phone]   Attachment    Document Type:  [facility] Release of Information Output Referrals Attachment: Lori Orozco ([id number] [id number])

## 2021-12-08 NOTE — Telephone Encounter (Signed)
Pt notified and will pick up CD and sign a release

## 2021-12-12 ENCOUNTER — Other Ambulatory Visit: Payer: Self-pay

## 2021-12-12 ENCOUNTER — Telehealth: Payer: Self-pay | Admitting: Internal Medicine

## 2021-12-12 DIAGNOSIS — M0600 Rheumatoid arthritis without rheumatoid factor, unspecified site: Secondary | ICD-10-CM

## 2021-12-12 DIAGNOSIS — Z79899 Other long term (current) drug therapy: Secondary | ICD-10-CM

## 2021-12-12 NOTE — Telephone Encounter (Signed)
Next Visit: 12/13/2021  Last Visit: 10/09/2021  Last Fill: 09/19/2021  DX: Seronegative rheumatoid arthritis   Current Dose per office note 10/09/2021: enbrel 69m subcutaneous once weekly  Labs: 10/09/2021 CMP Creat 1.24, eGFR 47 CBC WNL   TB Gold: 10/09/2021 Neg   Okay to refill Enbrel?

## 2021-12-12 NOTE — Telephone Encounter (Signed)
Patient called requesting prescription refill of Enbrel to be sent to [facility].  Patient states there are no refills remaining on her prescription and she is out of medication.

## 2021-12-13 ENCOUNTER — Ambulatory Visit: Payer: Medicare Other | Admitting: Internal Medicine

## 2021-12-13 DIAGNOSIS — Z79899 Other long term (current) drug therapy: Secondary | ICD-10-CM

## 2021-12-13 DIAGNOSIS — G8929 Other chronic pain: Secondary | ICD-10-CM

## 2021-12-13 DIAGNOSIS — R29898 Other symptoms and signs involving the musculoskeletal system: Secondary | ICD-10-CM

## 2021-12-13 DIAGNOSIS — M0600 Rheumatoid arthritis without rheumatoid factor, unspecified site: Secondary | ICD-10-CM

## 2021-12-13 MED ORDER — ENBREL SURECLICK 50 MG/ML ~~LOC~~ SOAJ: 50.0000 mg | SUBCUTANEOUS | 0 refills | Status: DC

## 2021-12-15 NOTE — Progress Notes (Signed)
Office Visit Note  Patient: Lori Orozco             Date of Birth: [birth year]           MRN: [id number]             PCP: Abner Greenspan, MD Referring: Tower, Wynelle Fanny, MD Visit Date: 12/21/2021   Subjective:  Follow-up (Neck nerve concern. Stiffness in hands.)   History of Present Illness: Lori Orozco is a 71 y.o. 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  She continues to have pain and stiffness affecting her hands bilaterally although the severity of swelling has gotten notably better on the Enbrel treatment but is far from resolved.  Her biggest concern at the moment is about her neck pain concern of cervical radiculopathy.  We checked x-ray that showed considerable multilevel degenerative disease with no acute appearing bony abnormality.  She is followed up with her spine doctor with epidural block injection tried did not find it very beneficial at all.  She has discussed radiofrequency ablation as a neck step option for radicular pain.   Previous HPI 10/09/2021 Lori Orozco is a 71 y.o. female here for follow up for seronegative inflammatory arthritis on Enbrel 50 mg [place] weekly and prednisone 10 mg daily. She tried tapering prednisone but had severe worsening symptoms at 5 mg dose. She increased back to 10 mg daily and symptoms improved somewhat but still a lot of pain and swelling in both hands and wrists. She has ongoing back pain and stiffness and is seeing D.r Saintclair Halsted for this with known significant lumbar spine arthritis. More recently new problem with sensitivity and stinging type pain throughout both arms with even light pressure such as showering water. She also has worsening weakness in her proximal legs unable to stand without using arms, which are limited by pain.     Previous HPI 07/05/2021 Lori Orozco is a 71 y.o. female here for follow up for seronegative inflammatory arthritis on prednisone 20  mg daily dose and enbrel 49m subcutaneous once weekly. She has seen a large improvement in joint inflammation since starting the Enbrel. She has pain and redness around the injection site lasting up to 3 days with each dose. Wrists continue to hurt and have decreased movement left wrist actually more symptomatic than right now. She is working with physical therapy on getting back to walking currently very weak and unstable due to illness and deconditioning. She had recent ED visit for COPD exacerbation finished antibiotics for this. No other recent infection issues.   Previous HPI 02/08/2021 Lori KRUSis a 71y.o. 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  She has had major medical events since her last visit.  She developed increased knee pain and swelling on the right side.  However she went to the hospital due to developing more weakness and shortness of breath CT angiogram identified bilateral pulmonary emboli.  Treatment with anticoagulation led to significant gastrointestinal bleeding from previously unknown AVM.  This was treated also in the hospital but overall protracted complications lasted for about a month hospitalization.  During this time she experienced worsening joint pain and swelling and significant deconditioning and weakness.  She was discharged from the hospital to rehab facility she was not continued on any steroid or other anti-inflammatory treatment reports joint pain and swelling affecting numerous joints in  bilateral elbows wrists fingers and knees.  This is limited any significant progress with physical therapy so far.  She called back to clinic earlier this month with the symptoms and was restarted on a prednisone taper currently down to 15 mg daily.  She saw orthopedics clinic for right knee aspiration injection 2 days ago that is partially helping.   Previous HPI 11/15/20 Lori Orozco is a 71 y.o.  female here for follow up with wrist wrist extensor tenosynovitis and presumed seronegative inflammatory arthritis also involving knee joints after starting methotrexate 15 mg PO weekly and tapering prednisone off. Her wrist is slightly improved but remains very painful and swollen with decreased mobility. After starting methotrexate she has noticed some episodes of dizziness and urinary retention and frequency. She stopped the prednisone without noticing much difference in symptoms so far.     09/20/20 Lori Orozco is a 71 y.o. female here for joint pain and swelling of knees and wrists with severe tenosynovitis s/p tenosynovectomy on 09/01/20 with Dr. Ninfa Linden.  She had been feeling in her usual health until June she recalls onset of symptoms after using a gas leaf blower at her home during which time she experienced some mild right wrist pain.  She does not recall any particular injury event at this time.  However she experienced progressive ongoing increase in pain with swelling and stiffness in her right wrist especially with decreased range of motion and pain over the dorsal aspect.  This is evaluated at orthopedics clinic with aspiration that revealed just coagulated sample trial of steroid medication and only partially improved symptoms then continued worsening.  She was admitted to the hospital for tenosynovectomy that was performed without complication.  Intraoperatively reported extensive inflammatory tissue debridement without any evidence concerning for infection.  During this hospitalization she also developed knee effusion which was aspirated demonstrating inflammatory synovial fluid with negative microscopy and cultures.  Is now almost 3 weeks since her surgery she continues experiencing severe pain intensity swelling around the right hand and wrist and has developed some skin blistering.     Labs reviewed 08/2020 Synovial fluid knee 4,525 WBCs 90% neutrophils ANA neg RF 14.2 CCP  neg ESR 37 CRP 14.1 Uric acid 4.9   07/2020 Synovial fluid wrist clotted sample negative gram stain   Review of Systems  Constitutional:  Positive for fatigue.  HENT:  Positive for mouth dryness. Negative for mouth sores.   Eyes:  Positive for dryness.  Respiratory:  Positive for shortness of breath.   Cardiovascular:  Negative for chest pain and palpitations.  Gastrointestinal:  Negative for blood in stool, constipation and diarrhea.  Endocrine: Negative for increased urination.  Genitourinary:  Negative for involuntary urination.  Musculoskeletal:  Positive for joint pain, gait problem, joint pain, joint swelling, myalgias, muscle weakness, morning stiffness, muscle tenderness and myalgias.  Skin:  Negative for color change, rash, hair loss and sensitivity to sunlight.  Allergic/Immunologic: Negative for susceptible to infections.  Neurological:  Negative for dizziness and headaches.  Hematological:  Negative for swollen glands.  Psychiatric/Behavioral:  Positive for depressed mood. Negative for sleep disturbance. The patient is nervous/anxious.     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   Bilateral leg weakness 10/09/2021   Vitamin B12 deficiency 08/02/2021   Myofascial pain dysfunction syndrome 08/02/2021   Adverse effect of prednisone 07/11/2021  Prediabetes 07/11/2021   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   History of pulmonary embolism 12/08/2020   Arthritis of knee 12/06/2020   Bilateral knee pain 09/20/2020   Seronegative rheumatoid arthritis ([place]) 09/20/2020   High risk medication use 09/20/2020   Extensor tenosynovitis of right wrist 09/15/2020   S/P reverse total shoulder arthroplasty, 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   Lung nodule 01/02/2020   Status post arthroscopy of left shoulder 01/16/2018   Estrogen deficiency 06/28/2017    Medial epicondylitis, left elbow 04/29/2017   S/P arthroscopy of left shoulder 02/25/2017   Impingement syndrome of left shoulder 01/30/2017   Pedal edema 10/26/2016   Trochanteric bursitis, right hip 04/10/2016   Essential hypertension 09/02/2015   Urge incontinence 07/27/2015   Obesity 04/28/2015   Screening for HIV (human immunodeficiency virus) 04/26/2015   Osteoarthritis of left knee 01/28/2015   Status post total left knee replacement 01/28/2015   Vitamin D deficiency 04/27/2014   Seasonal and perennial allergic rhinitis 06/17/2013   S/P total hip arthroplasty 03/20/2013   Medicare annual wellness visit, subsequent 11/18/2012   History of falling 11/18/2012   Routine general medical examination at a health care facility 69/62/9528   Lichen sclerosus et atrophicus of the vulva 07/29/2012   Osteopenia 10/19/2011   Hypothyroidism 10/19/2011   ANEMIA 10/11/2009   Hereditary and idiopathic peripheral neuropathy 08/05/2009   Low back pain 08/05/2009   HAND PAIN, BILATERAL 08/05/2009   TREMOR 03/09/2008   MENOPAUSAL SYNDROME 10/09/2007   DEPRESSION, MAJOR 05/20/2007   BIPOLAR AFFECTIVE DISORDER 05/20/2007   Generalized anxiety disorder 05/20/2007   PERSONALITY DISORDER 05/20/2007   ESOPHAGEAL SPASM 05/20/2007   Diaphragmatic hernia 05/20/2007   AMAUROSIS FUGAX 05/07/2007   COPD mixed type ([place]) 03/27/2007   HYPERCHOLESTEROLEMIA, PURE 12/10/2006   SYMPTOM, SYNDROME, CHRONIC FATIGUE 12/10/2006    Past Medical History:  Diagnosis Date   Amaurosis fugax    Anemia    hx   Anxiety    Arthritis    Asthma    Cataract    Chronic fatigue syndrome    Chronic kidney disease    frequency, nephritis when 71 yrs old   COPD (chronic obstructive pulmonary disease) ([place])    COVID-19    Depression    Diverticulitis    Emphysema of lung ([facility])    Fibromyalgia    GERD (gastroesophageal reflux disease)    occ   H/O hiatal hernia    History of bronchitis    Hyperlipidemia     Hypothyroidism    Interstitial cystitis    Irritable bowel syndrome    Lichen sclerosus    Lumbar herniated disc    Migraines    Motor nerve conduction block 11/2021   Pneumonia    PONV (postoperative nausea and vomiting)    Shortness of breath    exertion    Thyroid disease    [place]   Urinary frequency    Urinary tract infection    Vertigo     Family History  Problem Relation Age of Onset   Arthritis Mother    Hypertension Mother    Kidney disease Mother    Cancer Father        Bladder   Arthritis Sister    Colon cancer Other    Cancer - Lung Cousin    Arthritis Maternal Grandmother    Past Surgical History:  Procedure Laterality Date   ABDOMINAL HYSTERECTOMY     bladder sugery      CAROTID DOPPLAR     COLONOSCOPY  05/26/2010  avms- otherwise nl , re check 10y   DEXA-OSTEOPENIA     DOPPLER ECHOCARDIOGRAPHY     elbow surgery     EPICONDYLITIS     EYE SURGERY Bilateral    cataracts   FOOT SURGERY Bilateral    I & D EXTREMITY Right 09/01/2020   Procedure: TYNOSYNOVECTOMY;  Surgeon: Mcarthur Rossetti, MD;  Location: [facility];  Service: Orthopedics;  Laterality: Right;   KNEE SURGERY     Left cartilage   lipoma in second finger right hand     PLANTAR FASCIA SURGERY Left    REVERSE SHOULDER ARTHROPLASTY Left 02/02/2020   Procedure: LEFT REVERSE SHOULDER ARTHROPLASTY;  Surgeon: Meredith Pel, MD;  Location: [facility];  Service: Orthopedics;  Laterality: Left;   ROTATOR CUFF REPAIR Left 12/2017   TEMPOROMANDIBULAR JOINT SURGERY     TONSILLECTOMY     TOTAL HIP ARTHROPLASTY Right 03/20/2013   Procedure: Right TOTAL HIP ARTHROPLASTY;  Surgeon: Newt Minion, MD;  Location: [facility];  Service: Orthopedics;  Laterality: Right;  Right Total Hip Arthroplasty   TOTAL KNEE ARTHROPLASTY Left 01/28/2015   Procedure: LEFT TOTAL KNEE ARTHROPLASTY;  Surgeon: Mcarthur Rossetti, MD;  Location: [facility];  Service: Orthopedics;  Laterality: Left;   TRIGGER FINGER RELEASE Right     TROCHANTERIC BURSA EXCISION Right 05/03/2016   Dr. Ninfa Linden, [place] Gave   WRIST ARTHROSCOPY Right    ligament tear   Social History   Social History Narrative   Not on file   Immunization History  Administered Date(s) Administered   Influenza Split 11/19/2011, 11/02/2013   Influenza Whole 12/20/2006, 11/25/2008, 11/26/2009, 11/24/2010   Influenza, High Dose Seasonal PF 12/13/2016   Influenza,inj,Quad PF,6+ Mos 12/06/2015, 11/28/2021   Influenza-Unspecified 12/08/2012, 12/03/2014, 12/08/2017, 11/10/2018, 10/31/2019   Moderna Sars-Covid-2 Vaccination 06/21/2019, 07/19/2019, 01/22/2020   PFIZER(Purple Top)SARS-COV-2 Vaccination 06/04/2020   Pfizer Covid-19 Vaccine Bivalent Booster 59yrs & up 12/29/2021   Pneumococcal Conjugate-13 12/06/2015   Pneumococcal Polysaccharide-23 01/26/2002, 03/23/2008, 09/07/2010, 06/28/2017   Respiratory Syncytial Virus Vaccine,Recomb Aduvanted(Arexvy) 12/29/2021   Td 06/26/2000, 10/19/2011   Zoster Recombinat (Shingrix) 09/16/2017     Objective: Vital Signs: BP 122/78 (BP Location: Left Arm, Patient Position: Sitting, Cuff Size: Normal)   Pulse 73   Resp 16   Ht $R'5\' 7"'lo$  (1.702 m)   Wt 211 lb 12.8 oz (96.1 kg)   BMI 33.17 kg/m    Physical Exam Constitutional:      Appearance: She is obese.  HENT:     Mouth/Throat:     Mouth: Mucous membranes are moist.     Pharynx: Oropharynx is clear.     Comments: Gingival recession and erythema, no focal lesions Eyes:     Conjunctiva/sclera: Conjunctivae normal.  Cardiovascular:     Rate and Rhythm: Normal rate and regular rhythm.  Pulmonary:     Effort: Pulmonary effort is normal.     Breath sounds: Normal breath sounds.  Musculoskeletal:     Right lower leg: No edema.     Left lower leg: No edema.  Neurological:     Mental Status: She is alert.     Motor: Weakness present.     Gait: Gait abnormal.       Musculoskeletal Exam:  Neck full ROM no tenderness Left shoulder abduction ROM slightly  reduced no tenderness or swelling Elbows full ROM no tenderness or swelling Wrists with bilateral mild ulnar subluxation, ROM is reduced, well healed surgical scar on right, swelling on both sides and painful Right MCP  joints in mild flexion deformity, 2nd PIP with synovitis and tenderness in 3nd and 3rd fingers Knees full ROM right knee tenderness to pressure and with movement, no palpable effusion    CDAI Exam: CDAI Score: 21  Patient Global: 40 mm; Provider Global: 50 mm Swollen: 4 ; Tender: 8  Joint Exam 12/21/2021      Right  Left  Wrist  Swollen Tender  Swollen Tender  MCP 2  Swollen Tender     MCP 3  Swollen Tender     PIP 2   Tender     PIP 3   Tender     Knee   Tender   Tender      Investigation: No additional findings.  Imaging: MM Digital Diagnostic Unilat L  Result Date: 01/10/2022 CLINICAL DATA:  71 year old female presenting as a recall from screening for possible left breast calcifications. EXAM: DIGITAL DIAGNOSTIC UNILATERAL LEFT MAMMOGRAM WITH CAD TECHNIQUE: Left digital diagnostic mammography was performed. COMPARISON:  Previous exam(s). ACR Breast Density Category b: There are scattered areas of fibroglandular density. FINDINGS: Spot 2D magnification views and full true lateral 2D views of the left breast performed demonstrating persistence of 2 tiny adjacent calcifications in the medial left breast overall spanning 0.2 cm, possibly early rim calcification. No associated mass or distortion. IMPRESSION: Probably benign calcifications in the medial left breast spanning 0.2 cm. RECOMMENDATION: Diagnostic left breast mammogram in 6 months. I have discussed the findings and recommendations with the patient who agrees to short-term follow-up. If applicable, a reminder letter will be sent to the patient regarding the next appointment. BI-RADS CATEGORY  3: Probably benign. Electronically Signed   By: Audie Pinto M.D.   On: 01/10/2022 14:17   Recent Labs: Lab Results   Component Value Date   WBC 11.4 (H) 12/21/2021   HGB 12.6 12/21/2021   PLT 224 12/21/2021   NA 141 12/21/2021   K 4.2 12/21/2021   CL 101 12/21/2021   CO2 27 12/21/2021   GLUCOSE 83 12/21/2021   BUN 28 (H) 12/21/2021   CREATININE 1.15 (H) 12/21/2021   BILITOT 0.4 12/21/2021   ALKPHOS 57 07/11/2021   AST 15 12/21/2021   ALT 11 12/21/2021   PROT 6.5 12/21/2021   ALBUMIN 3.8 07/11/2021   CALCIUM 9.6 12/21/2021   GFRAA >60 01/29/2015   QFTBGOLDPLUS NEGATIVE 10/09/2021    Speciality Comments: No specialty comments available.  Procedures:  No procedures performed Allergies: Lithium; Tegretol [carbamazepine]; Tricyclic antidepressants; Methotrexate derivatives; Strawberry extract; Tapentadol; Codeine; Cymbalta [duloxetine hcl]; Erythromycin; Lyrica [pregabalin]; Neurontin [gabapentin]; Rabeprazole sodium; Baclofen; Duraprep [antiseptic products, misc.]; Penicillins; and Tape   Assessment / Plan:     Visit Diagnoses: Seronegative rheumatoid arthritis ([place]) - Plan: predniSONE (DELTASONE) 10 MG tablet, sulfaSALAzine (AZULFIDINE) 500 MG tablet, C-reactive protein  Still appears to have active inflammation with elevated serum inflammatory markers and joint tenderness and swelling on exam particularly affected in the bilateral wrists remain abnormal.  Will recheck CRP for disease activity monitoring.  Plan to continue Enbrel 50 mg subcu weekly.  Continuing prednisone 10 mg p.o. daily.  Discussed addition of sulfasalazine and titration up to 1000 mg twice daily see if addition alongside the Enbrel treatment allows greater symptom resolution or to tolerate taper prednisone to a safer dose or off.  High risk medication use - Plan: CBC with Differential/Platelet, COMPLETE METABOLIC PANEL WITH GFR  Checking CBC and CMP for medication monitoring on Enbrel and prednisone.  Discussed risks of sulfasalazine treatment including cytopenias hepatotoxicity infections  or most commonly just plain GI  intolerance.  Chronic pain of both knees Bilateral leg weakness  Bilateral leg weakness without definite effusions suspect there is worsening symptoms at this point associated with deconditioning and low mobility ongoing for months.  Orders: Orders Placed This Encounter  Procedures   C-reactive protein   CBC with Differential/Platelet   COMPLETE METABOLIC PANEL WITH GFR   Meds ordered this encounter  Medications   predniSONE (DELTASONE) 10 MG tablet    Sig: TAKE ONE TABLET BY MOUTH DAILY AS DIRECTED    Dispense:  30 tablet    Refill:  2   sulfaSALAzine (AZULFIDINE) 500 MG tablet    Sig: Take 1 tablet (500 mg total) by mouth 2 (two) times daily for 14 days, THEN 2 tablets (1,000 mg total) 2 (two) times daily.    Dispense:  100 tablet    Refill:  1     Follow-Up Instructions: Return in about 8 weeks (around 02/15/2022) for RA on ENB/GC/SSZ start f/u 45mo.   CCollier Salina MD  Note - This record has been created using [employer]  Chart creation errors have been sought, but may not always  have been located. Such creation errors do not reflect on  the standard of medical care.

## 2021-12-17 NOTE — Progress Notes (Unsigned)
Subjective:    Patient ID: Lori Orozco, female    DOB: February 06, 1951, 71 y.o.   MRN: [id number]  HPI  female former cigarette/ VAPE smoker followed for COPD, complicated by bipolar, GERD, Hiatal Hernia, Hypothyroid, Peripheral Neuropathy, Covid infection/MAB infusion Jan, 2021, DVT/PE, Rheumatoid Arthritis/ Enbrel,  PFT 10/09/10-  -----------------------------------------------------------------------------  06/19/21- 71 year old female former VAPE/ smoker followed for COPD,  Lung Nodules,, nicotine use, Allergic Rhinitis, complicated by Bipolar disorder, Hiatal Hernia, Hypothyroid, Peripheral Neuropathy, Covid infection/MAB infusion Jan, 2021,  -Flonase, Proair hfa, neb, Duoneb, Symbicort 160, prednisone 20 qd,  Covid vax- 3 Moderna, 1 Phizer Flu vax- had -----Patient has been in the hospital at the end of the year with resp failure and covid. Just recently started walking independently last month. Patient feels like her breathing is doing good.  Long hospital stay last winter included DVT/ PE. Just started walking again in Feb. Caretaker with her today. No home O2.  Now on Enbrel and hoping that will allow her to taper off prednisone- managed by Rheumatology.  12/19/21- 71 year old female former smoker (80 pkyrs) followed for COPD,  Lung Nodules,, nicotine use, Allergic Rhinitis, complicated by Bipolar disorder, Hiatal Hernia, Hypothyroid, Peripheral Neuropathy, Covid infection/MAB infusion Jan, 2021,  -Flonase, Proair hfa, neb, Duoneb, Symbicort 160, prednisone 10 qd,  Covid vax- 3 Moderna, 1 Phizer Flu vax- had Needs Flonase refilled but reports breathing has been very good with no exacerbation or significant symptoms. Substantially limited by DDD low back pain. Has f/u next week with specialist in [place] pending nerve block/ stimulator. CXR 06/29/21 No acute cardiopulmonary process.  ROS-see HPI   + = positive Constitutional:   No-   weight loss, night sweats, fevers, chills, fatigue,  lassitude. HEENT:   No-  headaches, difficulty swallowing, tooth/dental problems, sore throat,       No-  sneezing, itching, ear ache, nasal congestion, post nasal drip,  CV:  No-   chest pain, orthopnea, PND, swelling in lower extremities, anasarca, dizziness, palpitations Resp: shortness of breath with exertion or at rest.              No- productive cough,   non-productive cough,  No- coughing up of blood.               No-   change in color of mucus.  No- wheezing.   Skin: No-   rash or lesions. GI:  No-   heartburn, indigestion, abdominal pain, nausea, vomiting,  GU: MS:  + joint pain or swelling.  Marland Kitchen+ low back pain Neuro-     nothing unusual Psych:  No- change in mood or affect. depression or +anxiety.  No memory loss.  Objective:   Physical Exam  General- Alert, Oriented, Affect-appropriate, Distress-+moderate discomfort back pain.  +rolling walker, +obese Skin- rash-none, lesions- none, excoriation- none Lymphadenopathy- none Head- atraumatic            Eyes- Gross vision intact, PERRLA, conjunctivae clear secretions            Ears- Hearing, canals-normal            Nose- Clear, no-Septal dev, mucus, polyps, erosion, perforation             Throat- Mallampati III-IV , mucosa clear , drainage- none, tonsils- atrophic Neck- flexible , trachea midline, no stridor , thyroid nl, carotid no bruit Chest - symmetrical excursion , unlabored           Heart/CV- RRR , no murmur , no gallop  ,  no rub, nl s1 s2                           - JVD- none , edema- none, stasis changes- none, varices- none           Lung- +clear/ diminished , unlabored, cough-none , dullness-none, rub- none           Chest wall-   Abd-  Br/ Gen/ Rectal- Not done, not indicated Extrem- +jiggling legs Neuro-

## 2021-12-19 ENCOUNTER — Encounter: Payer: Self-pay | Admitting: Internal Medicine

## 2021-12-19 ENCOUNTER — Ambulatory Visit (INDEPENDENT_AMBULATORY_CARE_PROVIDER_SITE_OTHER): Payer: Medicare Other | Admitting: Internal Medicine

## 2021-12-19 DIAGNOSIS — J302 Other seasonal allergic rhinitis: Secondary | ICD-10-CM | POA: Diagnosis not present

## 2021-12-19 DIAGNOSIS — J3089 Other allergic rhinitis: Secondary | ICD-10-CM

## 2021-12-19 DIAGNOSIS — M5126 Other intervertebral disc displacement, lumbar region: Secondary | ICD-10-CM | POA: Diagnosis not present

## 2021-12-19 DIAGNOSIS — J449 Chronic obstructive pulmonary disease, unspecified: Secondary | ICD-10-CM | POA: Diagnosis not present

## 2021-12-19 MED ORDER — FLUTICASONE PROPIONATE 50 MCG/ACT NA SUSP: NASAL | 11 refills | Status: AC

## 2021-12-19 NOTE — Assessment & Plan Note (Signed)
CXR was stable in May. Ok with meds and denies change or concern. Plan-Continue present meds

## 2021-12-19 NOTE — Assessment & Plan Note (Signed)
Significant low back pain. Needs assistance to stand. Pending f/u with specialist next week.

## 2021-12-19 NOTE — Assessment & Plan Note (Signed)
Adequate control Plan- flonase refilled

## 2021-12-19 NOTE — Patient Instructions (Signed)
Flonase refill sent  I sure hope you can get some relief for your back pain quickly.

## 2021-12-21 ENCOUNTER — Encounter: Payer: Self-pay | Admitting: Internal Medicine

## 2021-12-21 ENCOUNTER — Ambulatory Visit: Payer: Medicare Other | Attending: Internal Medicine | Admitting: Internal Medicine

## 2021-12-21 VITALS — BP 122/78 | HR 73 | Resp 16 | Ht 67.0 in | Wt 211.8 lb

## 2021-12-21 DIAGNOSIS — M25561 Pain in right knee: Secondary | ICD-10-CM | POA: Diagnosis present

## 2021-12-21 DIAGNOSIS — M25562 Pain in left knee: Secondary | ICD-10-CM | POA: Insufficient documentation

## 2021-12-21 DIAGNOSIS — G8929 Other chronic pain: Secondary | ICD-10-CM | POA: Diagnosis present

## 2021-12-21 DIAGNOSIS — Z79899 Other long term (current) drug therapy: Secondary | ICD-10-CM | POA: Diagnosis present

## 2021-12-21 DIAGNOSIS — M0600 Rheumatoid arthritis without rheumatoid factor, unspecified site: Secondary | ICD-10-CM | POA: Insufficient documentation

## 2021-12-21 DIAGNOSIS — R29898 Other symptoms and signs involving the musculoskeletal system: Secondary | ICD-10-CM | POA: Diagnosis present

## 2021-12-21 MED ORDER — PREDNISONE 10 MG PO TABS: ORAL_TABLET | ORAL | 2 refills | Status: DC

## 2021-12-21 MED ORDER — SULFASALAZINE 500 MG PO TABS: ORAL_TABLET | ORAL | 1 refills | Status: DC

## 2021-12-21 NOTE — Patient Instructions (Signed)
Sulfasalazine Tablets What is this medication? SULFASALAZINE (sul fa SAL a zeen) treats ulcerative colitis. It works by decreasing inflammation. It belongs to a group of medications called salicylates. This medicine may be used for other purposes; ask your health care provider or pharmacist if you have questions. COMMON BRAND NAME(S): Azulfidine, Sulfazine What should I tell my care team before I take this medication? They need to know if you have any of these conditions: Asthma Blood disorders or anemia Glucose-6-phosphate dehydrogenase (G6PD) deficiency Intestinal obstruction Kidney disease Liver disease Porphyria Urinary tract obstruction An unusual reaction to sulfasalazine, sulfa medications, salicylates, or other medications, foods, dyes, or preservatives Pregnant or trying to get pregnant Breast-feeding How should I use this medication? Take this medication by mouth with a full glass of water. Take it as directed on the prescription label at the same time every day. You can take it with or without food. If it upsets your stomach, take it with food. Keep taking it unless your care team tells you to stop. Talk to your care team about the use of this medication in children. While this medication may be prescribed for children as young as 6 years for selected conditions, precautions do apply. Patients over 65 years old may have a stronger reaction and need a smaller dose. Overdosage: If you think you have taken too much of this medicine contact a poison control center or emergency room at once. NOTE: This medicine is only for you. Do not share this medicine with others. What if I miss a dose? If you miss a dose, take it as soon as you can. If it is almost time for your next dose, take only that dose. Do not take double or extra doses. What may interact with this medication? Digoxin Folic acid This list may not describe all possible interactions. Give your health care provider a list  of all the medicines, herbs, non-prescription drugs, or dietary supplements you use. Also tell them if you smoke, drink alcohol, or use illegal drugs. Some items may interact with your medicine. What should I watch for while using this medication? Visit your care team for regular checks on your progress. Tell your care team if your symptoms do not start to get better or if they get worse. You will need frequent blood and urine checks. This medication can make you more sensitive to the sun. Keep out of the sun. If you cannot avoid being in the sun, wear protective clothing and use sunscreen. Do not use sun lamps or tanning beds/booths. Drink plenty of water while taking this medication. What side effects may I notice from receiving this medication? Side effects that you should report to your care team as soon as possible: Allergic reactions--skin rash, itching, hives, swelling of the face, lips, tongue, or throat Aplastic anemia--unusual weakness or fatigue, dizziness, headache, trouble breathing, increased bleeding or bruising Dry cough, shortness of breath or trouble breathing Heart muscle inflammation--unusual weakness or fatigue, shortness of breath, chest pain, fast or irregular heartbeat, dizziness, swelling of the ankles, feet, or hands Infection--fever, chills, cough, sore throat, wounds that don't heal, pain or trouble when passing urine, general feeling of discomfort or being unwell Kidney injury--decrease in the amount of urine, swelling of the ankles, hands, or feet Liver injury--right upper belly pain, loss of appetite, nausea, light-colored stool, dark yellow or brown urine, yellowing skin or eyes, unusual weakness or fatigue Rash, fever, and swollen lymph nodes Redness, blistering, peeling, or loosening of the skin, including inside   the mouth Side effects that usually do not require medical attention (report to your care team if they continue or are bothersome): Dark yellow or orange  saliva, sweat, or urine Dizziness Headache Loss of appetite Nausea Upset stomach Vomiting This list may not describe all possible side effects. Call your doctor for medical advice about side effects. You may report side effects to FDA at 1-800-FDA-1088. Where should I keep my medication? Keep out of the reach of children and pets. Store at room temperature between 15 and 30 degrees C (59 and 86 degrees F). Get rid of any unused medication after the expiration date. To get rid of medications that are no longer needed or have expired: Take the medications to a medication take-back program. Check with your pharmacy or law enforcement to find a location. If you cannot return the medication, check the label or package insert to see if the medication should be thrown out in the garbage or flushed down the toilet. If you are not sure, ask your care team. If it is safe to put it in the trash, take the medication out of the container. Mix the medication with cat litter, dirt, coffee grounds, or other unwanted substance. Seal the mixture in a bag or container. Put it in the trash. NOTE: This sheet is a summary. It may not cover all possible information. If you have questions about this medicine, talk to your doctor, pharmacist, or health care provider.  2023 Elsevier/Gold Standard (2020-11-21 00:00:00)  

## 2021-12-22 LAB — COMPLETE METABOLIC PANEL WITH GFR
AG Ratio: 1.6 (calc) (ref 1.0–2.5)
ALT: 11 U/L (ref 6–29)
AST: 15 U/L (ref 10–35)
Albumin: 4 g/dL (ref 3.6–5.1)
Alkaline phosphatase (APISO): 76 U/L (ref 37–153)
BUN/Creatinine Ratio: 24 (calc) — ABNORMAL HIGH (ref 6–22)
BUN: 28 mg/dL — ABNORMAL HIGH (ref 7–25)
CO2: 27 mmol/L (ref 20–32)
Calcium: 9.6 mg/dL (ref 8.6–10.4)
Chloride: 101 mmol/L (ref 98–110)
Creat: 1.15 mg/dL — ABNORMAL HIGH (ref 0.60–1.00)
Globulin: 2.5 g/dL (calc) (ref 1.9–3.7)
Glucose, Bld: 83 mg/dL (ref 65–99)
Potassium: 4.2 mmol/L (ref 3.5–5.3)
Sodium: 141 mmol/L (ref 135–146)
Total Bilirubin: 0.4 mg/dL (ref 0.2–1.2)
Total Protein: 6.5 g/dL (ref 6.1–8.1)
eGFR: 51 mL/min/{1.73_m2} — ABNORMAL LOW (ref >=60)

## 2021-12-22 LAB — CBC WITH DIFFERENTIAL/PLATELET
Absolute Monocytes: 661 cells/uL (ref 200–950)
Basophils Absolute: 34 cells/uL (ref 0–200)
Basophils Relative: 0.3 %
Eosinophils Absolute: 114 cells/uL (ref 15–500)
Eosinophils Relative: 1 %
HCT: 38.7 % (ref 35.0–45.0)
Hemoglobin: 12.6 g/dL (ref 11.7–15.5)
Lymphs Abs: 3887 cells/uL (ref 850–3900)
MCH: 31.5 pg (ref 27.0–33.0)
MCHC: 32.6 g/dL (ref 32.0–36.0)
MCV: 96.8 fL (ref 80.0–100.0)
MPV: 11.1 fL (ref 7.5–12.5)
Monocytes Relative: 5.8 %
Neutro Abs: 6703 cells/uL (ref 1500–7800)
Neutrophils Relative %: 58.8 %
Platelets: 224 10*3/uL (ref 140–400)
RBC: 4 10*6/uL (ref 3.80–5.10)
RDW: 14 % (ref 11.0–15.0)
Total Lymphocyte: 34.1 %
WBC: 11.4 10*3/uL — ABNORMAL HIGH (ref 3.8–10.8)

## 2021-12-22 LAB — C-REACTIVE PROTEIN: CRP: 11.2 mg/L — ABNORMAL HIGH (ref <8.0)

## 2021-12-22 NOTE — Progress Notes (Signed)
Lab results look fine for continuing Enbrel and for starting the sulfasalazine as planned. Her CRP is slightly increased from before so definitely recommend adding on the new medication as planned.

## 2021-12-28 ENCOUNTER — Ambulatory Visit (INDEPENDENT_AMBULATORY_CARE_PROVIDER_SITE_OTHER): Payer: Medicare Other

## 2021-12-28 DIAGNOSIS — Z1231 Encounter for screening mammogram for malignant neoplasm of breast: Secondary | ICD-10-CM | POA: Diagnosis not present

## 2022-01-02 ENCOUNTER — Other Ambulatory Visit: Payer: Self-pay | Admitting: Family Medicine

## 2022-01-02 DIAGNOSIS — R928 Other abnormal and inconclusive findings on diagnostic imaging of breast: Secondary | ICD-10-CM

## 2022-01-04 ENCOUNTER — Encounter: Payer: Self-pay | Admitting: Family Medicine

## 2022-01-09 ENCOUNTER — Telehealth: Payer: Self-pay

## 2022-01-09 NOTE — Telephone Encounter (Signed)
Patient mailed Enbrel PAP renewal application to clinic. Submission pending provider signature, placed in Dr. Gregary Cromer folder.  Lori Orozco [employer] PharmD Candidate [phone]

## 2022-01-10 ENCOUNTER — Ambulatory Visit
Admission: RE | Admit: 2022-01-10 | Discharge: 2022-01-10 | Disposition: A | Discharge status: Home / Self Care | Payer: Medicare Other | Source: Ambulatory Visit | Attending: Family Medicine | Admitting: Family Medicine

## 2022-01-10 DIAGNOSIS — R928 Other abnormal and inconclusive findings on diagnostic imaging of breast: Secondary | ICD-10-CM

## 2022-01-12 ENCOUNTER — Other Ambulatory Visit: Payer: Self-pay | Admitting: Family Medicine

## 2022-01-12 ENCOUNTER — Encounter: Payer: Self-pay | Admitting: Family Medicine

## 2022-01-12 DIAGNOSIS — R921 Mammographic calcification found on diagnostic imaging of breast: Secondary | ICD-10-CM

## 2022-01-15 NOTE — Telephone Encounter (Signed)
Submitted Patient Assistance RENEWAL Application to [employer] for ENBREL along with provider portion, patient portion, medication, and insurance card copy. Will update patient when we receive a response.  Fax# [phone] Phone# [phone]  Chesley Mires, PharmD, MPH, BCPS, CPP Clinical Pharmacist (Rheumatology and Pulmonology)

## 2022-01-16 ENCOUNTER — Ambulatory Visit: Payer: Medicare Other | Admitting: Physical Therapy

## 2022-01-16 NOTE — Telephone Encounter (Signed)
Received fax from [employer] requesting authorization for most recent plan year.  Submitted a Prior Authorization renewal request to [facility] for ENBREL via CoverMyMeds. Will update once we receive a response.  Key: [id number]  Per automated response, the patient currently has access to the requested medication and a Prior Authorization is not needed for the patient/medication.   Printed previous "authorization letter" that has no date of auth on it. Faxed to [employer] notating that [employer] rep had to reach out to insurance regarding this authorization in March 2023  Chesley Mires, PharmD, MPH, BCPS, CPP Clinical Pharmacist (Rheumatology and Pulmonology)

## 2022-01-25 ENCOUNTER — Telehealth: Payer: Self-pay | Admitting: Internal Medicine

## 2022-01-25 NOTE — Telephone Encounter (Signed)
Patient called stating she just received a letter from [employer] stating they need a prior authorization within 45 days.  Patient requested a return call.

## 2022-02-04 ENCOUNTER — Other Ambulatory Visit: Payer: Self-pay | Admitting: Family Medicine

## 2022-02-06 ENCOUNTER — Telehealth: Payer: Self-pay

## 2022-02-06 NOTE — Chronic Care Management (AMB) (Signed)
Chronic Care Management Pharmacy Assistant   Name: COREY CAULFIELD  MRN: [id number] DOB: Jul 22, 1950  Reason for Encounter: Reminder Call    Medications: Outpatient Encounter Medications as of 02/06/2022  Medication Sig   albuterol (VENTOLIN HFA) 108 (90 Base) MCG/ACT inhaler Inhale 1-2 puffs into the lungs every 4 (four) hours as needed for wheezing or shortness of breath.   Black Cohosh 40 MG CAPS Take 1 capsule (40 mg total) by mouth daily.   budesonide-formoterol (SYMBICORT) 160-4.5 MCG/ACT inhaler Inhale 2 puffs into the lungs 2 (two) times daily.   buprenorphine (BUTRANS) 10 MCG/HR PTWK    buPROPion (WELLBUTRIN XL) 150 MG 24 hr tablet Take 150 mg by mouth daily.   busPIRone (BUSPAR) 15 MG tablet Take 15 mg by mouth 2 (two) times daily before a meal.   cholecalciferol (VITAMIN D) 25 MCG (1000 UNIT) tablet Take 2,000 Units by mouth daily. 2 tablets daily   clobetasol cream (TEMOVATE) 0.05 % APPLY SMALL AMOUNT TO AFFECTED AREAS TWO TO THREE TIMES PER WEEK   cyanocobalamin 2000 MCG tablet Take 1,000 mcg by mouth daily.   diazepam (VALIUM) 5 MG/ML solution Take by mouth every 8 (eight) hours as needed for anxiety. (Patient not taking: Reported on 12/21/2021)   diclofenac Sodium (VOLTAREN) 1 % GEL Apply 2 g topically 2 (two) times daily as needed (pain).   diphenhydrAMINE (BENADRYL) 25 MG tablet Take 25 mg by mouth every 6 (six) hours as needed for allergies.   divalproex (DEPAKOTE ER) 500 MG 24 hr tablet Take 500 mg by mouth at bedtime.    etanercept (ENBREL SURECLICK) 50 MG/ML injection Inject 50 mg into the skin once a week.   fluticasone (FLONASE) 50 MCG/ACT nasal spray PLACE 2 SPRAYS INTO THE NOSE DAILY.   HYDROcodone-acetaminophen (NORCO/VICODIN) 5-325 MG tablet Take 1 tablet by mouth 3 (three) times daily as needed for moderate pain.   ipratropium-albuterol (DUONEB) 0.5-2.5 (3) MG/3ML SOLN Take 3 mLs by nebulization every 6 (six) hours as needed.   ketotifen (ZADITOR) 0.025 %  ophthalmic solution Place 1 drop into both eyes 2 (two) times daily as needed (allergies).   levothyroxine (SYNTHROID) 50 MCG tablet Take 1 tablet (50 mcg total) by mouth daily before breakfast.   LORazepam (ATIVAN) 1 MG tablet Take 1 mg by mouth at bedtime as needed for anxiety or sleep.   metoprolol succinate (TOPROL-XL) 25 MG 24 hr tablet Take 0.5 tablets (12.5 mg total) by mouth in the morning and at bedtime.   mirabegron ER (MYRBETRIQ) 50 MG TB24 tablet Take 1 tablet (50 mg total) by mouth daily.   mirtazapine (REMERON) 30 MG tablet Take 30 mg by mouth at bedtime.    ondansetron (ZOFRAN-ODT) 4 MG disintegrating tablet Take 1 tablet (4 mg total) by mouth every 8 (eight) hours as needed for nausea or vomiting.   predniSONE (DELTASONE) 10 MG tablet TAKE ONE TABLET BY MOUTH DAILY AS DIRECTED   Rhubarb (ESTROVEN COMPLETE) 4 MG TABS Take 4 mg by mouth daily.   sertraline (ZOLOFT) 100 MG tablet Take 100 mg by mouth daily.    simvastatin (ZOCOR) 20 MG tablet TAKE ONE TABLET BY MOUTH EVERY NIGHT AT BEDTIME   Spacer/Aero-Holding Chambers (AEROCHAMBER PLUS) inhaler Use with inhaler   sulfaSALAzine (AZULFIDINE) 500 MG tablet Take 1 tablet (500 mg total) by mouth 2 (two) times daily for 14 days, THEN 2 tablets (1,000 mg total) 2 (two) times daily.   traMADol (ULTRAM) 50 MG tablet Take 50 mg by mouth  every 6 (six) hours as needed.   No facility-administered encounter medications on file as of 02/06/2022.  LATARSHA ZANI was contacted to remind of upcoming telephone visit with Al Corpus on 02/09/22 at 1:00. Patient was reminded to have any blood glucose and blood pressure readings available for review at appointment.   Message was left reminding patient of appointment.   CCM referral has been placed prior to visit?  Yes   Star Rating Drugs: Medication:  Last Fill: Day Supply Simvastatin 20mg  02/04/22 90   14/10/23, CPP notified  Al Corpus, [facility] concierge   [phone]

## 2022-02-09 ENCOUNTER — Ambulatory Visit: Payer: Medicare Other | Admitting: Pharmacist

## 2022-02-09 DIAGNOSIS — E7800 Pure hypercholesterolemia, unspecified: Secondary | ICD-10-CM

## 2022-02-09 DIAGNOSIS — M0600 Rheumatoid arthritis without rheumatoid factor, unspecified site: Secondary | ICD-10-CM

## 2022-02-09 DIAGNOSIS — G894 Chronic pain syndrome: Secondary | ICD-10-CM

## 2022-02-09 DIAGNOSIS — N3941 Urge incontinence: Secondary | ICD-10-CM

## 2022-02-09 DIAGNOSIS — E039 Hypothyroidism, unspecified: Secondary | ICD-10-CM

## 2022-02-09 DIAGNOSIS — I10 Essential (primary) hypertension: Secondary | ICD-10-CM

## 2022-02-09 DIAGNOSIS — J449 Chronic obstructive pulmonary disease, unspecified: Secondary | ICD-10-CM

## 2022-02-09 DIAGNOSIS — M8580 Other specified disorders of bone density and structure, unspecified site: Secondary | ICD-10-CM

## 2022-02-09 NOTE — Progress Notes (Signed)
Chronic Care Management Pharmacy Note  02/09/2022 Name:  Lori Orozco MRN:  [id number] DOB:  1950/11/18  Summary: CCM F/U visit -COPD: Symbicort PAP will end after 01/2022 and she will not be able to afford it in 2024; she can step up to [facility] to continue with AZ&Me PAP in 2024 -Osteopenia: pt has multiple risk factors including chronic prednisone use and hx of falls; she is due for repeat DEXA but has not been feeling well enough to undergo scan  Recommendations/Changes made from today's visit: -Recommend to switch Symbicort to [place] to continue with AZ&Me PAP in 2024 -Repeat DEXA when pt is able  Plan: -Pharmacist follow up televisit scheduled for 6 months -PCP appt 07/13/22 (CPE)   Subjective: Lori Orozco is an 71 y.o. year old female who is a primary patient of Orozco, Lori Fanny, MD.  The CCM team was consulted for assistance with disease management and care coordination needs.    Engaged with patient by telephone for follow up visit in response to provider referral for pharmacy case management and/or care coordination services.   Consent to Services:  The patient was given information about Chronic Care Management services, agreed to services, and gave verbal consent prior to initiation of services.  Please see initial visit note for detailed documentation.   Patient Care Team: Orozco, Lori Fanny, MD as PCP - Orozco Georgia, MD as Consulting Physician (Ophthalmology) Lori Grippe, MD as Referring Physician (Psychiatry) Lori Filbert, MD as Consulting Physician (Obstetrics and Gynecology) Lori Lever, MD as Consulting Physician (Pulmonary Disease) Lori Rossetti, MD as Consulting Physician (Orthopedic Surgery) Lori Minion, MD as Consulting Physician (Orthopedic Surgery) Lori Sinning, MD as Consulting Physician (Physical Medicine and Rehabilitation) Lori Crutch, MD as Referring Physician (Otolaryngology) Lori Almond, PhD as  Referring Physician (Psychology) Lori Orozco, [facility] as Pharmacist (Pharmacist)  Recent office visits: 11/28/21 Dr Glori Bickers OV: hx of falling; need repeat DEXA when well enough; 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  08/16/21 pt message - lyrica causing hand swelling. Ok to stop. 08/02/21 Dr Glori Bickers OV: neuropathy - trial pregabalin 25 mg. Reduce B12 to 1/2 or QOD. 07/11/21 Dr Glori Bickers OV: annual exam - labs stable. No changes.  05/16/21 Dr Glori Bickers OV: hospital f/u (covid and PE); increase Myrbetriq to 50 mg; iron high, hold Niferex and recheck iron 1 month.  03/01/21-PCP-Marne Tower,MD VV: Hospital f/u (Covid). Need to get Symbicort covered.  02/10/21-PCP-Marne Tower,MD-Telemedicine- Hospital follow up -Continue PT in home No medication changes  Recent consult visits: 02/08/22 Dr Abbe Amsterdam (Pain mgmt): advised not to take Trileptal given hx fevers on Tegretol and currently on depakote. Transitioning to opioids.  01/11/22 Dr Landry Mellow (Pain mgmt): Stop Butrans. Start Trileptal 150 mg BID.  12/25/21 Dr Landry Mellow (Pain mgmt): Order CRP, Sed rate. If negative order MRI spine. Increase Butrans to 20 mcg/hr q7 days. Failed tramadol and belbuca.   12/21/21 Dr Benjamine Mola (Rheumatology): RA - start sulfasalazine.   12/19/21 Dr Annamaria Boots (Pulm): COPD - no changes  10/09/21 Dr Benjamine Mola (Rheumatology): RA - failed prednisone taper. Add leflunomide.  07/05/21 Dr Benjamine Mola (Rheumatology): RA. Improved with Enbrel. Start tapering prednisone to 10 mg. Consider leflunomide in future if needed for steroid-sparing.  06/29/21 Seymour Orozco: COPD exacerbation. Rx'd cipro, prednisone 20 mg, albuterol.  06/19/21 Dr Annamaria Boots (pulmonary): f/u COPD. No med changes.  05/25/21 Dr Elly Modena (OB/Gyn): postmenopausal sx - rec Black Cohosh, Rhubarb for hot flashes.  05/22/21 PA Clark (ortho): f/u Knee OA. Steroid inj given.  02/25/21-Internal Medicine-Padageshwar Sunkara,MD- Telemedicine hospital follow up- no medication changes 02/08/21-Rheumatology-Lori  Rice,MD-Patient presented for follow up rheumatoid arthritis.Recommending Enbrel start for highly active seronegative arthritis(labs reviewed)No improvement to hydroxychloroquine and side effects with methotrexate.  02/02/21-Orthopedic Surgery-Gil Clark,PA-Patient presented for right knee pain.Injection with prednisone and lidocaine.  11/16/20-OB-GYN-Ugonna Anyanwu,MD-Patient presented for follow up lichen, sclerosus and menopausal symptoms.- clobetasol cream, (PREMARIN) 0.625 MG tablet  11/15/20-Rheumatolog-Lori Rice,MD- Patient presented for follow up.Will discontinue methotrexate and start hydroxychloroquine 400 mg PO daily.also recommend restarting prednisone at 40 mg PO daily for 10 days if helpful can taper otherwise discontinue.  Hospital visits: Medication Reconciliation was completed by comparing discharge summary, patient's EMR and Pharmacy list, and upon discussion with patient.   Admitted to the hospital on 02/13/21 due to Hypoxic respiratory failure. Discharge date was 02/23/21. Discharged from [facility], COPD, CAP, recent PE -cannot tolerate anticoagulation (HGB drop requiring transfusion). Stopped Eliquis, aspirin, ibuprofen.   Admitted to the hospital on 12/06/20 due to knee pain,pulmonary embolism. Discharge date was 01/03/21. Discharged from [facility].   -started Eluquis,carvedilol,lidoderm patches, melatonin,pantoprazole,pravastatin.    Objective:  Lab Results  Component Value Date   CREATININE 1.15 (H) 12/21/2021   BUN 28 (H) 12/21/2021   GFR 50.71 (L) 07/11/2021   EGFR 51 (L) 12/21/2021   GFRNONAA >60 09/06/2020   GFRAA >60 01/29/2015   NA 141 12/21/2021   K 4.2 12/21/2021   CALCIUM 9.6 12/21/2021   CO2 27 12/21/2021   GLUCOSE 83 12/21/2021    Lab Results  Component Value Date/Time   HGBA1C 5.4 07/11/2021 09:38 AM   HGBA1C 5.5 08/29/2020 04:12 AM   GFR 50.71 (L) 07/11/2021 09:38 AM    GFR 49.15 (L) 05/16/2021 03:26 PM    Last diabetic Eye exam: No results found for: "HMDIABEYEEXA"  Last diabetic Foot exam: No results found for: "HMDIABFOOTEX"   Lab Results  Component Value Date   CHOL 221 (H) 07/11/2021   HDL 70.50 07/11/2021   LDLCALC 123 (H) 07/11/2021   TRIG 136.0 07/11/2021   CHOLHDL 3 07/11/2021       Latest Ref Rng & Units 12/21/2021   11:41 AM 10/09/2021    1:32 PM 07/11/2021    9:38 AM  Hepatic Function  Total Protein 6.1 - 8.1 g/dL 6.5  6.7  6.4   Albumin 3.5 - 5.2 g/dL   3.8   AST 10 - 35 U/L _0 ALT 6 - 29 U/L _1 Alk Phosphatase 39 - 117 U/L   57   Total Bilirubin 0.2 - 1.2 mg/dL 0.4  0.6  0.4     Lab Results  Component Value Date/Time   TSH 3.86 07/11/2021 09:38 AM   TSH 5.59 (H) 05/16/2021 03:26 PM   FREET4 1.08 11/19/2011 03:23 PM       Latest Ref Rng & Units 12/21/2021   11:41 AM 10/09/2021    1:32 PM 07/11/2021    9:38 AM  CBC  WBC 3.8 - 10.8 Thousand/uL 11.4  9.7  9.6   Hemoglobin 11.7 - 15.5 g/dL 12.6  12.2  12.2   Hematocrit 35.0 - 45.0 % 38.7  37.7  37.9   Platelets 140 - 400 Thousand/uL 224  236  254.0     Lab Results  Component Value Date/Time   VD25OH 47.24 07/11/2021 09:38 AM   VD25OH 52.85 07/08/2020 09:11 AM    Clinical ASCVD:  No  The 10-year ASCVD risk score (Arnett DK, et al., 2019) is: 16.3%   Values used to calculate the score:     Age: 40 years     Sex: Female     Is Non-Hispanic African American: No     Diabetic: No     Tobacco smoker: No     Systolic Blood Pressure: 045 mmHg     Is BP treated: Yes     HDL Cholesterol: 70.5 mg/dL     Total Cholesterol: 221 mg/dL       08/02/2021    2:02 PM 07/11/2021    8:50 AM 07/10/2021    3:50 PM  Depression screen PHQ 2/9  Decreased Interest 0 0 0  Down, Depressed, Hopeless 3 0 3  PHQ - 2 Score 3 0 3  Altered sleeping  0 0  Tired, decreased energy  3 1  Change in appetite  3 0  Feeling bad or failure about yourself   0 0  Trouble  concentrating  1 0  Moving slowly or fidgety/restless   0  Suicidal thoughts  0 0  PHQ-9 Score  7 4  Difficult doing work/chores   Not difficult at all    Social History   Tobacco Use  Smoking Status Former   Packs/day: 2.00   Years: 40.00   Total pack years: 80.00   Types: E-cigarettes, Cigarettes   Quit date: 2015   Years since quitting: 8.9  Smokeless Tobacco Never   BP Readings from Last 3 Encounters:  12/21/21 122/78  12/19/21 (!) 144/82  11/28/21 132/74   Pulse Readings from Last 3 Encounters:  12/21/21 73  12/19/21 92  11/28/21 80   Wt Readings from Last 3 Encounters:  12/21/21 211 lb 12.8 oz (96.1 kg)  12/19/21 212 lb (96.2 kg)  11/28/21 215 lb 6.4 oz (97.7 kg)   BMI Readings from Last 3 Encounters:  12/21/21 33.17 kg/m  12/19/21 33.20 kg/m  11/28/21 33.74 kg/m    Assessment/Interventions: Review of patient past medical history, allergies, medications, health status, including review of consultants reports, laboratory and other test data, was performed as part of comprehensive evaluation and provision of chronic care management services.   SDOH:  (Social Determinants of Health) assessments and interventions performed: No - done May 2023 SDOH Interventions    Flowsheet Row Clinical Support from 07/10/2021 in [place] [facility] [place] Management from 05/15/2021 in [place] [facility] [place] from 06/30/2018 in [place] [facility] [place] from 05/09/2016 in [place] [facility] [place] Interventions Intervention Not Indicated -- -- --  Housing Interventions Intervention Not Indicated Intervention Not Indicated -- --  Transportation Interventions Intervention Not Indicated -- -- --  Depression Interventions/Treatment  Medication, Currently on Treatment -- PHQ2-9 Score <4 Follow-up Not Indicated Currently on Treatment, Counseling, Medication  [per pt,  she is under care of both psychiatrist and psychologist]  Financial Strain Interventions Intervention Not Indicated Other (Comment)  [Symbicort - enrolled AZ&Me,  provided info for LIS] -- --  Physical Activity Interventions Patient Refused -- -- --  Stress Interventions Intervention Not Indicated -- -- --  Social Connections Interventions Intervention Not Indicated -- -- --       SDOH Screenings   Food Insecurity: No Food Insecurity (07/10/2021)  Housing: Low Risk  (07/10/2021)  Transportation Needs: No Transportation Needs (07/10/2021)  Alcohol Screen: Low Risk  (07/10/2021)  Depression (  PHQ2-9): Low Risk  (08/02/2021)  Recent Concern: Depression (PHQ2-9) - Medium Risk (07/11/2021)  Financial Resource Strain: Low Risk  (07/10/2021)  Recent Concern: Financial Resource Strain - High Risk (05/15/2021)  Physical Activity: Inactive (07/10/2021)  Social Connections: Moderately Isolated (07/10/2021)  Stress: No Stress Concern Present (07/10/2021)  Tobacco Use: Medium Risk (12/28/2021)    CCM Care Plan  Allergies  Allergen Reactions   Lithium Anaphylaxis   Tegretol [Carbamazepine] Other (See Comments)    Fever and body aches (fever over 779)   Tricyclic Antidepressants Anaphylaxis    Other reaction(s): Unknown   Methotrexate Derivatives Other (See Comments)    Urinary retention and dizziness  Other reaction(s): Other UTI   Strawberry Extract Hives   Tapentadol Rash    PT ALLERGIC NYLON TAPE    Codeine Nausea Only    Makes pt stay awake   Cymbalta [Duloxetine Hcl] Other (See Comments)    Makes pt pass out    Erythromycin     abdominal pain   Lyrica [Pregabalin]     Felt faint   Neurontin [Gabapentin]     Passes  out   Rabeprazole Sodium     insomnia   Baclofen Nausea Only    Sever nausea   Duraprep [Antiseptic Products, Misc.] Itching and Rash    Tolerates Betadine    Penicillins Rash    Tolerated ANCEF 02/02/20  Has patient had a PCN reaction causing immediate rash,  facial/tongue/throat swelling, SOB or lightheadedness with hypotension: Yes Has patient had a PCN reaction causing severe rash involving mucus membranes or skin necrosis: No Has patient had a PCN reaction that required hospitalization No Has patient had a PCN reaction occurring within the last 10 years: No If all of the above answers are "NO", then may proceed with Cephalosporin use.    Tape Rash    PT ALLERGIC NYLON TAPE     Medications Reviewed Today     Reviewed by Collier Salina, MD (Physician) on 12/21/21 at 1132  Med List Status: <None>   Medication Order Taking? Sig Documenting Provider Last Dose Status Informant  albuterol (VENTOLIN HFA) 108 (90 Base) MCG/ACT inhaler [id number] Yes Inhale 1-2 puffs into the lungs every 4 (four) hours as needed for wheezing or shortness of breath. Melynda Ripple, MD Taking Active   Black Cohosh 40 MG CAPS [id number] Yes Take 1 capsule (40 mg total) by mouth daily. Constant, Peggy, MD Taking Active   budesonide-formoterol [facility]) 160-4.5 MCG/ACT inhaler [id number] Yes Inhale 2 puffs into the lungs 2 (two) times daily. Orozco, Lori Fanny, MD Taking Active   buprenorphine [facility]) 10 MCG/HR [place] [id number] Yes  [provider] Taking Active   buPROPion (WELLBUTRIN XL) 150 MG 24 hr tablet [id number] Yes Take 150 mg by mouth daily. [provider] Taking Active Self  busPIRone (BUSPAR) 15 MG tablet [id number] Yes Take 15 mg by mouth 2 (two) times daily before a meal. [provider] Taking Active Self  cholecalciferol (VITAMIN D) 25 MCG (1000 UNIT) tablet [id number] Yes Take 2,000 Units by mouth daily. 2 tablets daily [provider] Taking Active Self  clobetasol cream (TEMOVATE) 0.05 % [id number] Yes APPLY SMALL AMOUNT TO AFFECTED AREAS TWO TO THREE TIMES PER WEEK Orozco, Sallyanne Havers, MD Taking Active   cyanocobalamin 2000 MCG tablet [id number] Yes Take 1,000 mcg by mouth daily. [provider] Taking Active Self   diazepam (VALIUM) 5 MG/ML solution [id number] No Take by mouth every 8 (eight) hours as needed for anxiety.  Patient not taking: Reported on 12/21/2021   [provider] Not Taking Active   diclofenac Sodium (VOLTAREN) 1 % GEL [id number] Yes Apply 2 g topically 2 (two) times daily as needed (pain). Nolberto Hanlon, MD Taking Active   diphenhydrAMINE (BENADRYL) 25 MG tablet [id number] Yes Take 25 mg by mouth every 6 (six) hours as needed for allergies. [provider] Taking Active Self  divalproex (DEPAKOTE ER) 500 MG 24 hr tablet [id number] Yes Take 500 mg by mouth at bedtime.  [provider] Taking Active Self  etanercept (ENBREL SURECLICK) 50 MG/ML injection [id number] Yes Inject 50 mg into the skin once a week. Collier Salina, MD Taking Active   fluticasone [facility]) 50 MCG/ACT nasal spray [id number] Yes PLACE 2 SPRAYS INTO THE NOSE DAILY. Lori Lever, MD Taking Active   HYDROcodone-acetaminophen (NORCO/VICODIN) 5-325 MG tablet [id number] Yes Take 1 tablet by mouth 3 (three) times daily as needed for moderate pain. Lori Rossetti, MD Taking Active   ipratropium-albuterol (DUONEB) 0.5-2.5 (3) MG/3ML SOLN [id number] Yes Take 3 mLs by nebulization every 6 (six) hours as needed. Lori Lever, MD Taking Active            Med Note Jimmey Ralph, [facility]   Sun Aug 28, 2020 10:00 PM)    ketotifen (ZADITOR) 0.025 % ophthalmic solution [id number] Yes Place 1 drop into both eyes 2 (two) times daily as needed (allergies). [provider] Taking Active Self  levothyroxine (SYNTHROID) 50 MCG tablet [id number] Yes Take 1 tablet (50 mcg total) by mouth at bedtime. Orozco, Lori Fanny, MD Taking Active   LORazepam (ATIVAN) 1 MG tablet [id number] Yes Take 1 mg by mouth at bedtime as needed for anxiety or sleep. [provider] Taking Active Self  metoprolol succinate (TOPROL-XL) 25 MG 24 hr tablet [id number] Yes Take 0.5 tablets (12.5 mg total) by mouth in the  morning and at bedtime. Orozco, Lori Fanny, MD Taking Active   mirabegron ER (MYRBETRIQ) 50 MG TB24 tablet [id number] Yes Take 1 tablet (50 mg total) by mouth daily. Orozco, Lori Fanny, MD Taking Active   mirtazapine (REMERON) 30 MG tablet [id number] Yes Take 30 mg by mouth at bedtime.  [provider] Taking Active Self           Med Note Damita Dunnings, Elveria Rising   Fri Apr 04, 2018  8:11 AM)    ondansetron (ZOFRAN-ODT) 4 MG disintegrating tablet [id number] Yes Take 1 tablet (4 mg total) by mouth every 8 (eight) hours as needed for nausea or vomiting. Orozco, Lori Fanny, MD Taking Active   predniSONE (DELTASONE) 10 MG tablet [id number] Yes TAKE ONE TABLET BY MOUTH DAILY AS DIRECTED Orozco, Resa Miner, MD Taking Active   Rhubarb (ESTROVEN COMPLETE) 4 MG TABS [id number] Yes Take 4 mg by mouth daily. Constant, Peggy, MD Taking Active   sertraline (ZOLOFT) 100 MG tablet [id number] Yes Take 100 mg by mouth daily.  [provider] Taking Active Self           Med Note Tonia Ghent   Fri Apr 04, 2018  8:12 AM)    simvastatin (ZOCOR) 20 MG tablet [id number] Yes TAKE ONE TABLET BY MOUTH EVERY NIGHT AT BEDTIME Orozco, Lori Fanny, MD Taking Active   Spacer/Aero-Holding Chambers (AEROCHAMBER PLUS) inhaler [id number] Yes Use with inhaler Melynda Ripple, MD Taking Active   traMADol (ULTRAM) 50 MG tablet [id number] Yes Take 50 mg by mouth every 6 (six) hours as needed. 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   Bilateral leg weakness 10/09/2021   Vitamin B12 deficiency 08/02/2021   Myofascial pain dysfunction syndrome 08/02/2021   Adverse effect of prednisone 07/11/2021   Prediabetes 07/11/2021   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   History of pulmonary embolism 12/08/2020   Arthritis of knee 12/06/2020   Bilateral knee pain  09/20/2020   Seronegative rheumatoid arthritis ([place]) 09/20/2020   High risk medication use 09/20/2020   Extensor tenosynovitis of right wrist 09/15/2020   S/P reverse total shoulder arthroplasty, 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   Lung nodule 01/02/2020   Status post arthroscopy of left shoulder 01/16/2018   Estrogen deficiency 06/28/2017   Medial epicondylitis, left elbow 04/29/2017   S/P arthroscopy of left shoulder 02/25/2017   Impingement syndrome of left shoulder 01/30/2017   Pedal edema 10/26/2016   Trochanteric bursitis, right hip 04/10/2016   Essential hypertension 09/02/2015   Urge incontinence 07/27/2015   Obesity 04/28/2015   Screening for HIV (human immunodeficiency virus) 04/26/2015   Osteoarthritis of left knee 01/28/2015   Status post total left knee replacement 01/28/2015   Vitamin D deficiency 04/27/2014   Seasonal and perennial allergic rhinitis 06/17/2013   S/P total hip arthroplasty 03/20/2013   Medicare annual wellness visit, subsequent 11/18/2012   History of falling 11/18/2012   Routine general medical examination at a health care facility 17/61/6073   Lichen sclerosus et atrophicus of the vulva 07/29/2012   Osteopenia 10/19/2011   Hypothyroidism 10/19/2011   ANEMIA 10/11/2009   Hereditary and idiopathic peripheral neuropathy 08/05/2009   Low back pain 08/05/2009   HAND PAIN, BILATERAL 08/05/2009   TREMOR 03/09/2008   MENOPAUSAL SYNDROME 10/09/2007   DEPRESSION, MAJOR 05/20/2007   BIPOLAR AFFECTIVE DISORDER 05/20/2007   Generalized anxiety disorder 05/20/2007   PERSONALITY DISORDER 05/20/2007   ESOPHAGEAL SPASM 05/20/2007   Diaphragmatic hernia 05/20/2007   AMAUROSIS FUGAX 05/07/2007   COPD mixed type ([place]) 03/27/2007   HYPERCHOLESTEROLEMIA, PURE 12/10/2006   SYMPTOM, SYNDROME, CHRONIC FATIGUE 12/10/2006    Immunization History  Administered Date(s) Administered   Influenza Split 11/19/2011, 11/02/2013   Influenza Whole 12/20/2006, 11/25/2008, 11/26/2009,  11/24/2010   Influenza, High Dose Seasonal PF 12/13/2016   Influenza,inj,Quad PF,6+ Mos 12/06/2015, 11/28/2021   Influenza-Unspecified 12/08/2012, 12/03/2014, 12/08/2017, 11/10/2018, 10/31/2019   Moderna Sars-Covid-2 Vaccination 06/21/2019, 07/19/2019, 01/22/2020   PFIZER(Purple Top)SARS-COV-2 Vaccination 06/04/2020   Pfizer Covid-19 Vaccine Bivalent Booster 87yr & up 12/29/2021   Pneumococcal Conjugate-13 12/06/2015   Pneumococcal Polysaccharide-23 01/26/2002, 03/23/2008, 09/07/2010, 06/28/2017   Respiratory Syncytial Virus Vaccine,Recomb Aduvanted(Arexvy) 12/29/2021   Td 06/26/2000, 10/19/2011   Zoster Recombinat (Shingrix) 09/16/2017    Conditions to be addressed/monitored:  Hypertension, Hyperlipidemia, COPD, Chronic Kidney Disease, Hypothyroidism, and Overactive Bladder, RA  Care [place] : [place] Updates made by FCharlton Orozco RSouth Patrick Shoressince 02/09/2022 12:00 AM     Problem: Hypertension, Hyperlipidemia, COPD, Chronic Kidney Disease, Hypothyroidism, and Overactive Bladder, RA   Priority: High     Long-Range Goal: Disease mgmt   Start Date: 05/15/2021  Expected End Date: 05/16/2022  This Visit's Progress: On track  Recent Progress: On track  Priority: High  Note:   Current Barriers:  Cost concerns with inhalers Multiple adverse reactions with medications  Pharmacist Clinical Goal(s):  Patient will contact provider office for questions/concerns as evidenced notation of same in electronic health record through collaboration with PharmD and provider.  Interventions: 1:1 collaboration with Orozco, Lori Fanny, MD regarding development and update of comprehensive plan of care as evidenced by provider attestation and co-signature Inter-disciplinary care team collaboration (see longitudinal plan of care) Comprehensive medication review performed; medication list updated in electronic medical record  Hypertension / CKD Stage 3a (BP goal <130/80) -Controlled - BP at  goal in recent clinic visits -Current home BP readings: SBP 120s-140s -Denies hypotensive/hypertensive symptoms -Current treatment: Metoprolol succinate 25 mg - 1/2 tab BID - Appropriate, Effective, Safe, Accessible -Medications previously tried: amlodipine, furosemide  -Current dietary habits: not discussed -Current exercise habits: limited -Educated on BP goals and benefits of medications for prevention of heart attack, stroke and kidney damage; Importance of home blood pressure monitoring; Symptoms of hypotension and importance of maintaining adequate hydration; -Counseled to monitor BP at home daily -Recommended to continue current medication  Hyperlipidemia: (LDL goal < 100) -Not ideally controlled - LDL 123 (06/2021), which is the highest it has been in over a decade; pt endorses compliance with statin -Current treatment: Simvastatin 20 mg daily HS - Appropriate, Effective, Safe, Accessible -Medications previously tried: n/a  -Educated on Cholesterol goals; Benefits of statin for ASCVD risk reduction; -Consider optimizing statin therapy at follow up  COPD (Goal: control symptoms and prevent exacerbations) -Controlled - pt reports improved breathing while on Symbicort, now accessible through AZ&Me, however after 02/25/22 Symbicort will be removed from AZ&Me and pt cannot afford the copay; discussed Judithann Sauger is still available through PAP and she may benefit from upgrading to triple therapy clinically -Gold Grade: unable to assess -Current COPD Classification:  A (low sx, <2 exacerbations/yr) -Pulmonary function testing: 10/27/10- FEV1 1.54, FEV1/FVC 0.63, FEF 25-75% 0.84 -Exacerbations requiring treatment in last 6 months: 1 -Current treatment  Albuterol HFA -Appropriate, Effective, Safe, Accessible Symbicort 160-4.5 mcg/act 2 puff BID (PAP) -Appropriate, Effective, Safe, Accessible Duoneb PRN -Appropriate, Effective, Safe, Accessible -Medications previously tried: n/a  -Patient  denies consistent use of maintenance inhaler -Frequency of rescue inhaler use: 1-2x daily -Counseled on Proper inhaler technique; Benefits of consistent maintenance inhaler use -Recommend to change Symbicort to [employer]. Enrolled in AZ&Me for [facility] 2024.  Bipolar disorder / Depression / Anxiety (Goal: manage symptoms) -Controlled - per patient report; she reports every time they try to change her meds "something goes wrong" and they change it back -PHQ9: 0 (06/2020) - minimal depression -GAD7: not on file -Connected with Dr Lori Orozco for mental health support -Current treatment: Bupropion XL 150 mg daily -Appropriate, Effective, Safe, Accessible Buspirone 15 mg BID -Appropriate, Effective, Safe, Accessible Mirtazapine 30 mg HS -Appropriate, Effective, Safe, Accessible Sertraline 100 mg daily -Appropriate, Effective, Safe, Accessible Divalproex ER 500 mg daily HS -Appropriate, Effective, Safe, Accessible Lorazepam 1 mg HS prn -Appropriate, Effective, Safe, Accessible -Medications previously tried/failed: n/a -Educated on Benefits of medication for symptom control -Recommended to continue current medication  Osteoporosis screening (Goal prevent fractures) -Controlled - due for DEXA. On chronic prednisone and high fall risk -Last DEXA Scan: 06/2017   T-Score femoral neck: -0.4  T-Score lumbar spine: +0.4  10-year probability of major osteoporotic fracture: n/a  10-year probability of hip fracture: n/a -Patient is not a candidate for pharmacologic treatment -Current treatment  Vitamin D 1000 IU - 2 daily - Appropriate, Effective, Safe, Accessible -Medications previously tried: n/a  -Recommend weight-bearing and muscle strengthening exercises for building and maintaining bone density. -Recommend Repeat DEXA when able  Hypothyroidism (Goal: maintain TSH in goal range) -Controlled - TSH 3.86 (06/2021) at goal -Current treatment  Levothyroxine  50 mcg HS - Appropriate, Effective, Safe,  Accessible -Medications previously tried: n/a  -Recommended to continue current medication  Anemia (Goal: maintain HGB) -Controlled- HGB 12.2 (06/2021) at goal; she is now off of iron after iron/HGB levels normalized -Current treatment  Vitamin B12 2000 mcg daily - Appropriate, Query Effective -Medications previously tried: Niferex -Recommend to continue current medication  OAB (Goal: manage symptoms) -Controlled - pt reports higher dose of Myrbetriq is working reasonably well, she would like to take 25 mg BID however because she feels it wears off in the daytime, she is going to check with her insurance to see fi they will cover 25 mg BID -Current treatment  Myrbetriq ER 50 mg daily HS - Appropriate, Effective, Safe, Accessible -Medications previously tried: n/a  -Recommend to continue current medication  Rheumatoid Arthritis (Goal: manage pain) -Controlled- pt reports Enbrel is a "miracle drug" for her, pain is much improved and she is in process of tapering off prednisone -Pt reports leflunomide side effects: diarrhea, muscle pain, headaches, chills -Follows with Dr Benjamine Mola [facility]) -Current treatment  Voltaren gel - Appropriate, Effective, Safe, Accessible Hydrocodone-APAP 5-325 mg TID PRN -Appropriate, Effective, Safe, Accessible Enbrel 50 mg weekly -Appropriate, Effective, Safe, Accessible Prednisone 10 mg daily - Appropriate, Effective, Safe, Accessible -Medications previously tried: plaquenil, methotrexate; neuropathy- baclofen, gabepntin, pregabalin -Recommended to continue current medication  Chronic pain (Goal: manage pain) -Not ideally controlled - pt endorses medication do not help much with pain, she has nerve stimulator procedure scheduled for Jan 2024 -Degenerative disc disease, lumbar radiculopathy, RA; query neuropathy - pt has not tolerated any neuralgia drugs -Established with Dr Landry Mellow, [facility] -Current treatment  Prednisone 10 mg daily -  Appropriate, Effective, Query Safe Tramadol 50 mg q6h PRN -Appropriate, Query Effective Voltaren gel - Appropriate, Effective, Safe, Accessible -Medications previously tried: tegretol, gabapentin, pregabalin, duloxetine, trileptal, butrans, belbuca, tramadol  -Recommended to continue current medication  Postmenopausal symptoms (Goal: manage symptoms) -Not ideally controlled - pt stopped Premarin after last visit due to VTE risk; she has started supplements and reports they are not helping much with hot flashes but understands that it can take several months for effect -Current treatment  Black Cohosh - Appropriate, Query Effective, Safe, Accessible Rhubarb/Estroven -Appropriate, Query Effective, Safe, Accessible -Medications previously tried: Premarin -Discussed risk of VTE "clot" with hormone replacement; 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; furthermore pt has not been able to tolerate anticoagulation so would consider her risk even higher for developing a clot  -Recommend to continue current medication  Health Maintenance -Vaccine gaps: Shingrix #2 -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 Patient given website and phone number to apply for LIS (Extra Help)  Compliance/Adherence/Medication fill history: Care Gaps: Colonoscopy (due 05/25/20)  Star-Rating Drugs: Simvastatin -LF 05/14/21 X 90 DS --PDC inaccurate due to hospitalizations in Fall 2022  Medication Access: Within the past 30 days, how often has patient missed a dose of medication? 0 Is a pillbox or other method used to improve  adherence? Yes  Factors that may affect medication adherence? no barriers identified Are meds synced by current pharmacy? No  Are  meds delivered by current pharmacy? No  Does patient experience delays in picking up medications due to transportation concerns? No   [facility] Services Reviewed: Is patient disadvantaged to use [facility]?: No  Current Rx insurance plan: [employer] [place] Name and location of Current pharmacy:  [id number] - [place], [place] [address] Phone: [phone] Fax: [phone]  [facility] services reviewed with patient today?: No  Patient requests to transfer care to [facility]?: No  Reason patient declined to change pharmacies: Loyalty to other pharmacy/Patient preference   Care Plan and Follow Up Patient Decision:  Patient agrees to Care Plan and Follow-up.  Plan: Telephone follow up appointment with care management team member scheduled for:  6 months  Charlene Brooke, PharmD, BCACP Clinical Pharmacist [facility] at [facility] [phone]

## 2022-02-09 NOTE — Patient Instructions (Signed)
Visit Information  Phone number for Pharmacist: [phone]   Goals Addressed   None     Care Plan : CCM Pharmacy Care Plan  Updates made by Kathyrn Sheriff, RPH since 02/09/2022 12:00 AM     Problem: Hypertension, Hyperlipidemia, COPD, Chronic Kidney Disease, Hypothyroidism, and Overactive Bladder, RA   Priority: High     Long-Range Goal: Disease mgmt   Start Date: 05/15/2021  Expected End Date: 05/16/2022  This Visit's Progress: On track  Recent Progress: On track  Priority: High  Note:   Current Barriers:  Cost concerns with inhalers Multiple adverse reactions with medications  Pharmacist Clinical Goal(s):  Patient will contact provider office for questions/concerns as evidenced notation of same in electronic health record through collaboration with PharmD and provider.   Interventions: 1:1 collaboration with Tower, Audrie Gallus, MD regarding development and update of comprehensive plan of care as evidenced by provider attestation and co-signature Inter-disciplinary care team collaboration (see longitudinal plan of care) Comprehensive medication review performed; medication list updated in electronic medical record  Hypertension / CKD Stage 3a (BP goal <130/80) -Controlled - BP at goal in recent clinic visits -Current home BP readings: SBP 120s-140s -Denies hypotensive/hypertensive symptoms -Current treatment: Metoprolol succinate 25 mg - 1/2 tab BID - Appropriate, Effective, Safe, Accessible -Medications previously tried: amlodipine, furosemide  -Current dietary habits: not discussed -Current exercise habits: limited -Educated on BP goals and benefits of medications for prevention of heart attack, stroke and kidney damage; Importance of home blood pressure monitoring; Symptoms of hypotension and importance of maintaining adequate hydration; -Counseled to monitor BP at home daily -Recommended to continue current medication  Hyperlipidemia: (LDL goal < 100) -Not  ideally controlled - LDL 123 (06/2021), which is the highest it has been in over a decade; pt endorses compliance with statin -Current treatment: Simvastatin 20 mg daily HS - Appropriate, Effective, Safe, Accessible -Medications previously tried: n/a  -Educated on Cholesterol goals; Benefits of statin for ASCVD risk reduction; -Consider optimizing statin therapy at follow up  COPD (Goal: control symptoms and prevent exacerbations) -Controlled - pt reports improved breathing while on Symbicort, now accessible through AZ&Me, however after 02/25/22 Symbicort will be removed from AZ&Me and pt cannot afford the copay; discussed Markus Daft is still available through PAP and she may benefit from upgrading to triple therapy clinically -Gold Grade: unable to assess -Current COPD Classification:  A (low sx, <2 exacerbations/yr) -Pulmonary function testing: 10/27/10- FEV1 1.54, FEV1/FVC 0.63, FEF 25-75% 0.84 -Exacerbations requiring treatment in last 6 months: 1 -Current treatment  Albuterol HFA -Appropriate, Effective, Safe, Accessible Symbicort 160-4.5 mcg/act 2 puff BID (PAP) -Appropriate, Effective, Safe, Accessible Duoneb PRN -Appropriate, Effective, Safe, Accessible -Medications previously tried: n/a  -Patient denies consistent use of maintenance inhaler -Frequency of rescue inhaler use: 1-2x daily -Counseled on Proper inhaler technique; Benefits of consistent maintenance inhaler use -Recommend to change Symbicort to [employer]. Enrolled in [place] for [facility] 2024.  Bipolar disorder / Depression / Anxiety (Goal: manage symptoms) -Controlled - per patient report; she reports every time they try to change her meds "something goes wrong" and they change it back -PHQ9: 0 (06/2020) - minimal depression -GAD7: not on file -Connected with Dr Phillip Heal for mental health support -Current treatment: Bupropion XL 150 mg daily -Appropriate, Effective, Safe, Accessible Buspirone 15 mg BID -Appropriate,  Effective, Safe, Accessible Mirtazapine 30 mg HS -Appropriate, Effective, Safe, Accessible Sertraline 100 mg daily -Appropriate, Effective, Safe, Accessible Divalproex ER 500 mg daily HS -Appropriate, Effective, Safe, Accessible Lorazepam 1 mg  HS prn -Appropriate, Effective, Safe, Accessible -Medications previously tried/failed: n/a -Educated on Benefits of medication for symptom control -Recommended to continue current medication  Osteoporosis screening (Goal prevent fractures) -Controlled - due for DEXA. On chronic prednisone and high fall risk -Last DEXA Scan: 06/2017   T-Score femoral neck: -0.4  T-Score lumbar spine: +0.4  10-year probability of major osteoporotic fracture: n/a  10-year probability of hip fracture: n/a -Patient is not a candidate for pharmacologic treatment -Current treatment  Vitamin D 1000 IU - 2 daily - Appropriate, Effective, Safe, Accessible -Medications previously tried: n/a  -Recommend weight-bearing and muscle strengthening exercises for building and maintaining bone density. -Recommend Repeat DEXA when able  Hypothyroidism (Goal: maintain TSH in goal range) -Controlled - TSH 3.86 (06/2021) at goal -Current treatment  Levothyroxine 50 mcg HS - Appropriate, Effective, Safe, Accessible -Medications previously tried: n/a  -Recommended to continue current medication  Anemia (Goal: maintain HGB) -Controlled- HGB 12.2 (06/2021) at goal; she is now off of iron after iron/HGB levels normalized -Current treatment  Vitamin B12 2000 mcg daily - Appropriate, Query Effective -Medications previously tried: Niferex -Recommend to continue current medication  OAB (Goal: manage symptoms) -Controlled - pt reports higher dose of Myrbetriq is working reasonably well, she would like to take 25 mg BID however because she feels it wears off in the daytime, she is going to check with her insurance to see fi they will cover 25 mg BID -Current treatment  Myrbetriq ER 50 mg  daily HS - Appropriate, Effective, Safe, Accessible -Medications previously tried: n/a  -Recommend to continue current medication  Rheumatoid Arthritis (Goal: manage pain) -Controlled- pt reports Enbrel is a "miracle drug" for her, pain is much improved and she is in process of tapering off prednisone -Pt reports leflunomide side effects: diarrhea, muscle pain, headaches, chills -Follows with Dr Dimple Casey [facility]) -Current treatment  Voltaren gel - Appropriate, Effective, Safe, Accessible Hydrocodone-APAP 5-325 mg TID PRN -Appropriate, Effective, Safe, Accessible Enbrel 50 mg weekly -Appropriate, Effective, Safe, Accessible Prednisone 10 mg daily - Appropriate, Effective, Safe, Accessible -Medications previously tried: plaquenil, methotrexate; neuropathy- baclofen, gabepntin, pregabalin -Recommended to continue current medication  Chronic pain (Goal: manage pain) -Not ideally controlled - pt endorses medication do not help much with pain, she has nerve stimulator procedure scheduled for Jan 2024 -Degenerative disc disease, lumbar radiculopathy, RA; query neuropathy - pt has not tolerated any neuralgia drugs -Established with Dr Richardson Dopp, [facility] -Current treatment  Prednisone 10 mg daily - Appropriate, Effective, Query Safe Tramadol 50 mg q6h PRN -Appropriate, Query Effective Voltaren gel - Appropriate, Effective, Safe, Accessible -Medications previously tried: tegretol, gabapentin, pregabalin, duloxetine, trileptal, butrans, belbuca, tramadol  -Recommended to continue current medication  Postmenopausal symptoms (Goal: manage symptoms) -Not ideally controlled - pt stopped Premarin after last visit due to VTE risk; she has started supplements and reports they are not helping much with hot flashes but understands that it can take several months for effect -Current treatment  Black Cohosh - Appropriate, Query Effective, Safe, Accessible Rhubarb/Estroven -Appropriate, Query  Effective, Safe, Accessible -Medications previously tried: Premarin -Discussed risk of VTE "clot" with hormone replacement; 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; furthermore pt has not been able to tolerate anticoagulation so would consider her risk even higher for developing a clot  -Recommend to continue current medication  Health Maintenance -Vaccine gaps: Shingrix #2 -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 Active MyChart status and patient understanding of how to access instructions and care plan via MyChart confirmed with patient.    Telephone follow up appointment with pharmacy team member scheduled for: 6 months  Al Corpus, PharmD, [facility] Clinical Pharmacist [facility] at [facility] [phone]

## 2022-02-14 ENCOUNTER — Telehealth: Payer: Self-pay | Admitting: Orthopaedic Surgery

## 2022-02-14 ENCOUNTER — Encounter: Payer: Self-pay | Admitting: Family Medicine

## 2022-02-14 NOTE — Telephone Encounter (Signed)
Called [employer] regarding patient's application for Enbrel application.   Per rep, they need PA approval letter for new plan which we will be unable to run until 02/26/22.   Patient received last shipment of Enbrel on 12/21/21.  Chesley Mires, PharmD, MPH, BCPS, CPP Clinical Pharmacist (Rheumatology and Pulmonology)

## 2022-02-14 NOTE — Telephone Encounter (Signed)
Called [employer] regarding patient's application for Enbrel application.  Per rep, they need PA approval letter for new plan which our pharmacy team will be unable to process until 02/26/22.  Patient received last shipment of Enbrel on 12/21/21 so she will be due for refill in January. Rep requested PA letter be faxed to them ASAP once plan is active. Will f/u after new year  Chesley Mires, PharmD, MPH, BCPS, CPP Clinical Pharmacist (Rheumatology and Pulmonology)

## 2022-02-14 NOTE — Telephone Encounter (Signed)
Pt called requesting to submit right knee gel injection. Please call pt when approval. Pt phone number is [phone]

## 2022-02-15 NOTE — Telephone Encounter (Signed)
Noted.  Will submit January, 2024 

## 2022-02-21 ENCOUNTER — Ambulatory Visit: Payer: Medicare Other | Admitting: Internal Medicine

## 2022-02-27 ENCOUNTER — Encounter: Payer: Self-pay | Admitting: Family Medicine

## 2022-02-28 ENCOUNTER — Encounter: Payer: Self-pay | Admitting: Internal Medicine

## 2022-02-28 ENCOUNTER — Ambulatory Visit: Payer: Medicare Other | Attending: Internal Medicine | Admitting: Internal Medicine

## 2022-02-28 VITALS — BP 126/69 | HR 75 | Resp 13 | Ht 67.0 in | Wt 214.0 lb

## 2022-02-28 DIAGNOSIS — M0600 Rheumatoid arthritis without rheumatoid factor, unspecified site: Secondary | ICD-10-CM | POA: Diagnosis present

## 2022-02-28 DIAGNOSIS — Z79899 Other long term (current) drug therapy: Secondary | ICD-10-CM | POA: Diagnosis present

## 2022-02-28 MED ORDER — PREDNISONE 10 MG PO TABS: ORAL_TABLET | ORAL | 2 refills | Status: DC

## 2022-02-28 MED ORDER — SULFASALAZINE 500 MG PO TABS: 1000.0000 mg | ORAL_TABLET | Freq: Two times a day (BID) | ORAL | 2 refills | Status: DC

## 2022-02-28 NOTE — Telephone Encounter (Signed)
VOB submitted for Monovisc, right knee. 

## 2022-02-28 NOTE — Progress Notes (Signed)
Office Visit Note  Patient: Lori Orozco             Date of Birth: 06-Aug-1950           MRN: [id number]             PCP: Abner Greenspan, MD Referring: Tower, Wynelle Fanny, MD Visit Date: 02/28/2022   Subjective:  No chief complaint on file.   History of Present Illness: Lori Orozco is a 72 y.o. female here for follow up for seronegative rheumatoid arthritis on Enbrel 50 mg subcu weekly and prednisone 10 mg daily.  She had to stop taking the sulfasalazine since about 2 weeks ago due to running out of the medication.  She had change with insurance status so having a dramatically increased cost for her medications at the moment that she is having to sort out.  She was tolerating the medicine okay.  She did not recall a noticeable difference in symptoms with starting the sulfasalazine but while off the medication has felt noticeably worse so thinks it was doing something.  Particularly the left wrist pain has been a lot better on treatment her right wrist continues to be bad before and after.  She saw Dr. Saintclair Halsted for her neck recommendation was for physical therapy but she has not pursued this since and does not feel like she would tolerate this well based on previous therapy experiences.  She does have plans for trying the nerve stimulator treatment starting later this month.    Previous HPI 12/21/21 Lori Orozco is a 72 y.o. 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  She continues to have pain and stiffness affecting her hands bilaterally although the severity of swelling has gotten notably better on the Enbrel treatment but is far from resolved.  Her biggest concern at the moment is about her neck pain concern of cervical radiculopathy.  We checked x-ray that showed considerable multilevel degenerative disease with no acute appearing bony abnormality.  She is followed up with her spine doctor with epidural block injection tried  did not find it very beneficial at all.  She has discussed radiofrequency ablation as a neck step option for radicular pain.     Previous HPI 10/09/2021 Lori Orozco is a 72 y.o. female here for follow up for seronegative inflammatory arthritis on Enbrel 50 mg [place] weekly and prednisone 10 mg daily. She tried tapering prednisone but had severe worsening symptoms at 5 mg dose. She increased back to 10 mg daily and symptoms improved somewhat but still a lot of pain and swelling in both hands and wrists. She has ongoing back pain and stiffness and is seeing D.r Saintclair Halsted for this with known significant lumbar spine arthritis. More recently new problem with sensitivity and stinging type pain throughout both arms with even light pressure such as showering water. She also has worsening weakness in her proximal legs unable to stand without using arms, which are limited by pain.     Previous HPI 07/05/2021 Lori Orozco is a 72 y.o. female here for follow up for seronegative inflammatory arthritis on prednisone 20 mg daily dose and enbrel 77m subcutaneous once weekly. She has seen a large improvement in joint inflammation since starting the Enbrel. She has pain and redness around the injection site lasting up to 3 days with each dose. Wrists continue to hurt and have decreased movement left wrist actually more symptomatic than right now. She  is working with physical therapy on getting back to walking currently very weak and unstable due to illness and deconditioning. She had recent ED visit for COPD exacerbation finished antibiotics for this. No other recent infection issues.   Previous HPI 02/08/2021 Lori Orozco is a 72 y.o. 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  She has had major medical events since her last visit.  She developed increased knee pain and swelling on the right side.  However she went to the hospital due to  developing more weakness and shortness of breath CT angiogram identified bilateral pulmonary emboli.  Treatment with anticoagulation led to significant gastrointestinal bleeding from previously unknown AVM.  This was treated also in the hospital but overall protracted complications lasted for about a month hospitalization.  During this time she experienced worsening joint pain and swelling and significant deconditioning and weakness.  She was discharged from the hospital to rehab facility she was not continued on any steroid or other anti-inflammatory treatment reports joint pain and swelling affecting numerous joints in bilateral elbows wrists fingers and knees.  This is limited any significant progress with physical therapy so far.  She called back to clinic earlier this month with the symptoms and was restarted on a prednisone taper currently down to 15 mg daily.  She saw orthopedics clinic for right knee aspiration injection 2 days ago that is partially helping.   Previous HPI 11/15/20 Lori Orozco is a 72 y.o. female here for follow up with wrist wrist extensor tenosynovitis and presumed seronegative inflammatory arthritis also involving knee joints after starting methotrexate 15 mg PO weekly and tapering prednisone off. Her wrist is slightly improved but remains very painful and swollen with decreased mobility. After starting methotrexate she has noticed some episodes of dizziness and urinary retention and frequency. She stopped the prednisone without noticing much difference in symptoms so far.     09/20/20 Lori Orozco is a 72 y.o. female here for joint pain and swelling of knees and wrists with severe tenosynovitis s/p tenosynovectomy on 09/01/20 with Dr. Ninfa Linden.  She had been feeling in her usual health until June she recalls onset of symptoms after using a gas leaf blower at her home during which time she experienced some mild right wrist pain.  She does not recall any particular injury  event at this time.  However she experienced progressive ongoing increase in pain with swelling and stiffness in her right wrist especially with decreased range of motion and pain over the dorsal aspect.  This is evaluated at orthopedics clinic with aspiration that revealed just coagulated sample trial of steroid medication and only partially improved symptoms then continued worsening.  She was admitted to the hospital for tenosynovectomy that was performed without complication.  Intraoperatively reported extensive inflammatory tissue debridement without any evidence concerning for infection.  During this hospitalization she also developed knee effusion which was aspirated demonstrating inflammatory synovial fluid with negative microscopy and cultures.  Is now almost 3 weeks since her surgery she continues experiencing severe pain intensity swelling around the right hand and wrist and has developed some skin blistering.     Labs reviewed 08/2020 Synovial fluid knee 4,525 WBCs 90% neutrophils ANA neg RF 14.2 CCP neg ESR 37 CRP 14.1 Uric acid 4.9   07/2020 Synovial fluid wrist clotted sample negative gram stain   Review of Systems  Constitutional:  Positive for fatigue.  HENT:  Positive for mouth dryness.  Negative for mouth sores.   Eyes:  Positive for dryness.  Respiratory:  Positive for shortness of breath.   Cardiovascular:  Negative for chest pain and palpitations.  Gastrointestinal:  Positive for constipation and diarrhea. Negative for blood in stool.  Endocrine: Positive for increased urination.  Genitourinary:  Negative for involuntary urination.  Musculoskeletal:  Positive for joint pain, gait problem, joint pain, joint swelling, myalgias, muscle weakness, morning stiffness, muscle tenderness and myalgias.  Skin:  Negative for color change, rash, hair loss and sensitivity to sunlight.  Allergic/Immunologic: Negative for susceptible to infections.  Neurological:  Positive for  headaches. Negative for dizziness.  Hematological:  Negative for swollen glands.  Psychiatric/Behavioral:  Positive for depressed mood and sleep disturbance. The patient is nervous/anxious.     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   Bilateral leg weakness 10/09/2021   Vitamin B12 deficiency 08/02/2021   Myofascial pain dysfunction syndrome 08/02/2021   Adverse effect of prednisone 07/11/2021   Prediabetes 07/11/2021   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   History of pulmonary embolism 12/08/2020   Arthritis of knee 12/06/2020   Bilateral knee pain 09/20/2020   Seronegative rheumatoid arthritis ([place]) 09/20/2020   High risk medication use 09/20/2020   Extensor tenosynovitis of right wrist 09/15/2020   S/P reverse total shoulder arthroplasty, 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   Lung nodule 01/02/2020   Status post arthroscopy of left shoulder 01/16/2018   Estrogen deficiency 06/28/2017   Medial epicondylitis, left elbow 04/29/2017   S/P arthroscopy of left shoulder 02/25/2017   Impingement syndrome of left shoulder 01/30/2017   Pedal edema 10/26/2016   Trochanteric bursitis, right hip 04/10/2016   Essential hypertension 09/02/2015   Urge incontinence 07/27/2015   Obesity 04/28/2015   Screening for HIV (human immunodeficiency virus) 04/26/2015   Osteoarthritis of left knee 01/28/2015   Status post total left knee replacement 01/28/2015   Vitamin D deficiency 04/27/2014   Seasonal and perennial allergic rhinitis 06/17/2013   S/P total hip arthroplasty 03/20/2013   Medicare annual wellness visit, subsequent 11/18/2012   History of falling 11/18/2012   Routine general medical examination at a health care facility 12/20/8525   Lichen sclerosus et atrophicus of the vulva 07/29/2012   Osteopenia 10/19/2011   Hypothyroidism 10/19/2011   ANEMIA 10/11/2009    Hereditary and idiopathic peripheral neuropathy 08/05/2009   Low back pain 08/05/2009   HAND PAIN, BILATERAL 08/05/2009   TREMOR 03/09/2008   MENOPAUSAL SYNDROME 10/09/2007   DEPRESSION, MAJOR 05/20/2007   BIPOLAR AFFECTIVE DISORDER 05/20/2007   Generalized anxiety disorder 05/20/2007   PERSONALITY DISORDER 05/20/2007   ESOPHAGEAL SPASM 05/20/2007   Diaphragmatic hernia 05/20/2007   AMAUROSIS FUGAX 05/07/2007   COPD mixed type ([employer]) 03/27/2007   HYPERCHOLESTEROLEMIA, PURE 12/10/2006   SYMPTOM, SYNDROME, CHRONIC FATIGUE 12/10/2006    Past Medical History:  Diagnosis Date   Amaurosis fugax    Anemia    hx   Anxiety    Arthritis    Asthma    Cataract    Chronic fatigue syndrome    Chronic kidney disease    frequency, nephritis when 72 yrs old   COPD (chronic obstructive pulmonary disease) ([place])    COVID-19    Depression    Diverticulitis    Emphysema of lung ([facility])    Fibromyalgia    GERD (gastroesophageal reflux disease)    occ   H/O  hiatal hernia    History of bronchitis    Hyperlipidemia    Hypothyroidism    Interstitial cystitis    Irritable bowel syndrome    Lichen sclerosus    Lumbar herniated disc    Migraines    Motor nerve conduction block 11/2021   Pneumonia    PONV (postoperative nausea and vomiting)    Shortness of breath    exertion    Thyroid disease    [place]   Urinary frequency    Urinary tract infection    Vertigo     Family History  Problem Relation Age of Onset   Arthritis Mother    Hypertension Mother    Kidney disease Mother    Cancer Father        Bladder   Arthritis Sister    Colon cancer Other    Cancer - Lung Cousin    Arthritis Maternal Grandmother    Past Surgical History:  Procedure Laterality Date   ABDOMINAL HYSTERECTOMY     bladder sugery      CAROTID DOPPLAR     COLONOSCOPY  05/26/2010   avms- otherwise nl , re check 10y   DEXA-OSTEOPENIA     DOPPLER ECHOCARDIOGRAPHY     elbow surgery     EPICONDYLITIS     EYE  SURGERY Bilateral    cataracts   FOOT SURGERY Bilateral    I & D EXTREMITY Right 09/01/2020   Procedure: TYNOSYNOVECTOMY;  Surgeon: Mcarthur Rossetti, MD;  Location: [facility];  Service: Orthopedics;  Laterality: Right;   KNEE SURGERY     Left cartilage   lipoma in second finger right hand     PLANTAR FASCIA SURGERY Left    REVERSE SHOULDER ARTHROPLASTY Left 02/02/2020   Procedure: LEFT REVERSE SHOULDER ARTHROPLASTY;  Surgeon: Meredith Pel, MD;  Location: [facility];  Service: Orthopedics;  Laterality: Left;   ROTATOR CUFF REPAIR Left 12/2017   TEMPOROMANDIBULAR JOINT SURGERY     TONSILLECTOMY     TOTAL HIP ARTHROPLASTY Right 03/20/2013   Procedure: Right TOTAL HIP ARTHROPLASTY;  Surgeon: Newt Minion, MD;  Location: [facility];  Service: Orthopedics;  Laterality: Right;  Right Total Hip Arthroplasty   TOTAL KNEE ARTHROPLASTY Left 01/28/2015   Procedure: LEFT TOTAL KNEE ARTHROPLASTY;  Surgeon: Mcarthur Rossetti, MD;  Location: [facility];  Service: Orthopedics;  Laterality: Left;   TRIGGER FINGER RELEASE Right    TROCHANTERIC BURSA EXCISION Right 05/03/2016   Dr. Jean Rosenthal   WRIST ARTHROSCOPY Right    ligament tear   Social History   Social History Narrative   Not on file   Immunization History  Administered Date(s) Administered   Influenza Split 11/19/2011, 11/02/2013   Influenza Whole 12/20/2006, 11/25/2008, 11/26/2009, 11/24/2010   Influenza, High Dose Seasonal PF 12/13/2016   Influenza,inj,Quad PF,6+ Mos 12/06/2015, 11/28/2021   Influenza-Unspecified 12/08/2012, 12/03/2014, 12/08/2017, 11/10/2018, 10/31/2019   Moderna Sars-Covid-2 Vaccination 06/21/2019, 07/19/2019, 01/22/2020   PFIZER(Purple Top)SARS-COV-2 Vaccination 06/04/2020   Pfizer Covid-19 Vaccine Bivalent Booster 38yr & up 12/29/2021   Pneumococcal Conjugate-13 12/06/2015   Pneumococcal Polysaccharide-23 01/26/2002, 03/23/2008, 09/07/2010, 06/28/2017   Respiratory Syncytial Virus Vaccine,Recomb  Aduvanted(Arexvy) 12/29/2021   Td 06/26/2000, 10/19/2011, 02/14/2022   Zoster Recombinat (Shingrix) 09/16/2017     Objective: Vital Signs: BP 126/69 (BP Location: Right Arm, Patient Position: Sitting, Cuff Size: Normal)   Pulse 75   Resp 13   Ht _0  (1.702 m)   Wt 214 lb (97.1 kg)   BMI 33.52 kg/m  Physical Exam Eyes:     Conjunctiva/sclera: Conjunctivae normal.  Cardiovascular:     Rate and Rhythm: Normal rate and regular rhythm.  Pulmonary:     Effort: Pulmonary effort is normal.     Breath sounds: Normal breath sounds.  Skin:    General: Skin is warm and dry.  Neurological:     Mental Status: She is alert.  Psychiatric:        Mood and Affect: Mood normal.      Musculoskeletal Exam:  Neck full ROM no tenderness Shoulders mildly restricted abduction and external rotation but without focal tenderness and no swelling Elbows full ROM no tenderness or swelling Right wrist with tenderness and swelling severely limited dorsiflexion range of motion and with a significant ulnar deviation, left wrist tenderness with small palpable effusion present Right hand MCP joints and not quite completely reducible flexion deformity, tenderness to pressure at the second and third digits Knees full ROM without appreciable swelling Very tender to even extremely light pressure throughout the calf shin and feet on both sides, unable to relax toes and extend with tightness and severe tenderness to pressure over the plantar aspect of the foot   CDAI Exam: CDAI Score: 12  Patient Global: 40 mm; Provider Global: 20 mm Swollen: 2 ; Tender: 4  Joint Exam 02/28/2022      Right  Left  Wrist  Swollen Tender  Swollen Tender  MCP 2   Tender     MCP 3   Tender         Investigation: No additional findings.  Imaging: No results found.  Recent Labs: Lab Results  Component Value Date   WBC 11.4 (H) 12/21/2021   HGB 12.6 12/21/2021   PLT 224 12/21/2021   NA 141 12/21/2021   K 4.2  12/21/2021   CL 101 12/21/2021   CO2 27 12/21/2021   GLUCOSE 83 12/21/2021   BUN 28 (H) 12/21/2021   CREATININE 1.15 (H) 12/21/2021   BILITOT 0.4 12/21/2021   ALKPHOS 57 07/11/2021   AST 15 12/21/2021   ALT 11 12/21/2021   PROT 6.5 12/21/2021   ALBUMIN 3.8 07/11/2021   CALCIUM 9.6 12/21/2021   GFRAA >60 01/29/2015   QFTBGOLDPLUS NEGATIVE 10/09/2021    Speciality Comments: No specialty comments available.  Procedures:  No procedures performed Allergies: Lithium; Tegretol [carbamazepine]; Tricyclic antidepressants; Methotrexate derivatives; Strawberry extract; Tapentadol; Codeine; Cymbalta [duloxetine hcl]; Erythromycin; Lyrica [pregabalin]; Neurontin [gabapentin]; Rabeprazole sodium; Baclofen; Duraprep [antiseptic products, misc.]; Penicillins; and Tape   Assessment / Plan:     Visit Diagnoses: Seronegative rheumatoid arthritis ([place]) - Plan: sulfaSALAzine (AZULFIDINE) 500 MG tablet, predniSONE (DELTASONE) 10 MG tablet, C-reactive protein, Sedimentation rate  Joint inflammation appears similar as at our last visit though she is back off the sulfasalazine for a few weeks.  Will recheck sedimentation rate and CRP for disease activity monitoring.  Recommend she continue the Enbrel 50 mg subcu weekly and prednisone 10 mg daily at this time and we will resume on the sulfasalazine 1000 mg twice daily.  Hopefully to get and stay on treatment with this could further taper the prednisone dose going forwards.  High risk medication use - Plan: CBC with Differential/Platelet, COMPLETE METABOLIC PANEL WITH GFR  Checking CBC and CMP for medication monitoring on long-term use of Enbrel and with the prednisone and sulfasalazine.  She has not experienced any significant infections since her last visit or intolerance to the medication.  Orders: Orders Placed This Encounter  Procedures   C-reactive  protein   CBC with Differential/Platelet   COMPLETE METABOLIC PANEL WITH GFR   Sedimentation rate    Meds ordered this encounter  Medications   sulfaSALAzine (AZULFIDINE) 500 MG tablet    Sig: Take 2 tablets (1,000 mg total) by mouth 2 (two) times daily.    Dispense:  120 tablet    Refill:  2   predniSONE (DELTASONE) 10 MG tablet    Sig: TAKE ONE TABLET BY MOUTH DAILY AS DIRECTED    Dispense:  30 tablet    Refill:  2     Follow-Up Instructions: Return in about 3 months (around 05/30/2022) for RA on ENB/SSZ/GC.   Collier Salina, MD  Note - This record has been created using [employer].  Chart creation errors have been sought, but may not always  have been located. Such creation errors do not reflect on  the standard of medical care.

## 2022-02-28 NOTE — Telephone Encounter (Signed)
Do you think you can help her out with any of this?    Thanks in advance

## 2022-03-02 ENCOUNTER — Telehealth: Payer: Self-pay

## 2022-03-02 ENCOUNTER — Ambulatory Visit: Payer: Medicare Other | Admitting: Pharmacist

## 2022-03-02 DIAGNOSIS — M1711 Unilateral primary osteoarthritis, right knee: Secondary | ICD-10-CM

## 2022-03-02 NOTE — Patient Instructions (Signed)
Visit Information  Phone number for Pharmacist: [phone]  Thank you for meeting with me to discuss your medications! Below is a summary of what we talked about during the visit:   Recommendations/Changes made from today's visit: -Applied online for Medicare Extra Help, pt will receive letter from [facility] with outcome over the next several weeks -Advised pt to refill generic medications as the come due; if she has any issues with cost of specific meds advised she contact me  Follow up plan: -Pharmacist follow up televisit scheduled for 1 month -PCP appt 07/13/22   Charlene Brooke, PharmD, BCACP Clinical Pharmacist [facility] [phone]

## 2022-03-02 NOTE — Progress Notes (Signed)
Care Management & Coordination Services Pharmacy Note  03/02/2022 Name:  Lori Orozco MRN:  [id number] DOB:  August 10, 1950  Summary: F/U visit -Medication cost review: pt reports she was told last year that the does not qualify for Medicare Extra Help anymore, it is unclear whether they were talking about 2023 or 2024; reviewed monthly income limit for 2024 is $1843 and pt reports her SS income is $1736 so it sounds like she should still qualify. -Pt also got new insurance this year [facility] PD[facility]) with high deductible, so she cannot afford Myrbetriq refill (was told it would be over $500); she also though generic drugs would be high cost with deductible, but discussed deductible does not apply to generics so these should be reasonable -Discussed alternatives to Myrbetriq - oxbutynin and trospium are the preferred antimuscarinics with her plan, she did not want to try these right now   Recommendations/Changes made from today's visit: -Applied online for Medicare Extra Help, pt will receive letter from [facility] with outcome over the next several weeks -Advised pt to refill generic medications as the come due; if she has any issues with cost of specific meds advised she contact me  Follow up plan: -Pharmacist follow up televisit scheduled for 1 month -PCP appt 07/13/22     Subjective: Lori Orozco is an 72 y.o. year old female who is a primary patient of Tower, Wynelle Fanny, MD.  The care coordination team was consulted for assistance with disease management and care coordination needs.    Engaged with patient by telephone for follow up visit.  Patient Care Team: Tower, Wynelle Fanny, MD as PCP - Aris Georgia, MD as Consulting Physician (Ophthalmology) Pearson Grippe, MD as Referring Physician (Psychiatry) Emily Filbert, MD as Consulting Physician (Obstetrics and Gynecology) Deneise Lever, MD as Consulting Physician (Pulmonary Disease) Mcarthur Rossetti, MD as Consulting  Physician (Orthopedic Surgery) Newt Minion, MD as Consulting Physician (Orthopedic Surgery) Magnus Sinning, MD as Consulting Physician (Physical Medicine and Rehabilitation) Melinda Crutch, MD as Referring Physician (Otolaryngology) Mel Almond, PhD as Referring Physician (Psychology) Charlton Haws, [facility] as Pharmacist (Pharmacist)  Recent office visits: 11/28/21 Dr Glori Bickers OV: hx of falling; need repeat DEXA when well enough; 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   08/16/21 pt message - lyrica causing hand swelling. Ok to stop. 08/02/21 Dr Glori Bickers OV: neuropathy - trial pregabalin 25 mg. Reduce B12 to 1/2 or QOD. 07/11/21 Dr Glori Bickers OV: annual exam - labs stable. No changes.  Recent consult visits: 02/28/22 Dr Benjamine Mola (Rheumatology): f/u - d/c butrans patch, diazepam. Update sulfasalazine Rx to 1000 mg BID - pt has been off med 2 weeks d/t cost.  02/08/22 Dr Abbe Amsterdam (Pain mgmt): advised not to take Trileptal given hx fevers on Tegretol and currently on depakote. Transitioning to opioids.   01/11/22 Dr Landry Mellow (Pain mgmt): Stop Butrans. Start Trileptal 150 mg BID.   12/25/21 Dr Landry Mellow (Pain mgmt): Order CRP, Sed rate. If negative order MRI spine. Increase Butrans to 20 mcg/hr q7 days. Failed tramadol and belbuca.    12/21/21 Dr Benjamine Mola (Rheumatology): RA - start sulfasalazine.    12/19/21 Dr Annamaria Boots (Pulm): COPD - no changes   10/09/21 Dr Benjamine Mola (Rheumatology): RA - failed prednisone taper. Add leflunomide.  07/05/21 Dr Benjamine Mola (Rheumatology): RA. Improved with Enbrel. Start tapering prednisone to 10 mg. Consider leflunomide in future if needed for steroid-sparing.  Hospital visits: None in previous 6 months   Objective:  Lab Results  Component Value Date  CREATININE 1.15 (H) 12/21/2021   BUN 28 (H) 12/21/2021   GFR 50.71 (L) 07/11/2021   EGFR 51 (L) 12/21/2021   GFRNONAA >60 09/06/2020   GFRAA >60 01/29/2015   NA 141 12/21/2021   K 4.2 12/21/2021   CALCIUM 9.6 12/21/2021   CO2 27 12/21/2021   GLUCOSE 83  12/21/2021    Lab Results  Component Value Date/Time   HGBA1C 5.4 07/11/2021 09:38 AM   HGBA1C 5.5 08/29/2020 04:12 AM   GFR 50.71 (L) 07/11/2021 09:38 AM   GFR 49.15 (L) 05/16/2021 03:26 PM    Last diabetic Eye exam: No results found for: "HMDIABEYEEXA"  Last diabetic Foot exam: No results found for: "HMDIABFOOTEX"   Lab Results  Component Value Date   CHOL 221 (H) 07/11/2021   HDL 70.50 07/11/2021   LDLCALC 123 (H) 07/11/2021   TRIG 136.0 07/11/2021   CHOLHDL 3 07/11/2021       Latest Ref Rng & Units 12/21/2021   11:41 AM 10/09/2021    1:32 PM 07/11/2021    9:38 AM  Hepatic Function  Total Protein 6.1 - 8.1 g/dL 6.5  6.7  6.4   Albumin 3.5 - 5.2 g/dL   3.8   AST 10 - 35 U/L 15  20  21    ALT 6 - 29 U/L 11  18  27    Alk Phosphatase 39 - 117 U/L   57   Total Bilirubin 0.2 - 1.2 mg/dL 0.4  0.6  0.4     Lab Results  Component Value Date/Time   TSH 3.86 07/11/2021 09:38 AM   TSH 5.59 (H) 05/16/2021 03:26 PM   FREET4 1.08 11/19/2011 03:23 PM       Latest Ref Rng & Units 12/21/2021   11:41 AM 10/09/2021    1:32 PM 07/11/2021    9:38 AM  CBC  WBC 3.8 - 10.8 Thousand/uL 11.4  9.7  9.6   Hemoglobin 11.7 - 15.5 g/dL 12.6  12.2  12.2   Hematocrit 35.0 - 45.0 % 38.7  37.7  37.9   Platelets 140 - 400 Thousand/uL 224  236  254.0     Lab Results  Component Value Date/Time   VD25OH 47.24 07/11/2021 09:38 AM   VD25OH 52.85 07/08/2020 09:11 AM   VITAMINB12 >1504 (H) 08/02/2021 02:43 PM   VITAMINB12 298 08/05/2009 07:57 PM    Clinical ASCVD: No  The 10-year ASCVD risk score (Arnett DK, et al., 2019) is: 13.4%   Values used to calculate the score:     Age: 6 years     Sex: Female     Is Non-Hispanic African American: No     Diabetic: No     Tobacco smoker: No     Systolic Blood Pressure: 509 mmHg     Is BP treated: Yes     HDL Cholesterol: 70.5 mg/dL     Total Cholesterol: 221 mg/dL       08/02/2021    2:02 PM 07/11/2021    8:50 AM 07/10/2021    3:50 PM   Depression screen PHQ 2/9  Decreased Interest 0 0 0  Down, Depressed, Hopeless 3 0 3  PHQ - 2 Score 3 0 3  Altered sleeping  0 0  Tired, decreased energy  3 1  Change in appetite  3 0  Feeling bad or failure about yourself   0 0  Trouble concentrating  1 0  Moving slowly or fidgety/restless   0  Suicidal thoughts  0 0  PHQ-9 Score  7 4  Difficult doing work/chores   Not difficult at all     Social History   Tobacco Use  Smoking Status Former   Packs/day: 2.00   Years: 40.00   Total pack years: 80.00   Types: E-cigarettes, Cigarettes   Quit date: 2015   Years since quitting: 9.0   Passive exposure: Never  Smokeless Tobacco Never   BP Readings from Last 3 Encounters:  02/28/22 126/69  12/21/21 122/78  12/19/21 (!) 144/82   Pulse Readings from Last 3 Encounters:  02/28/22 75  12/21/21 73  12/19/21 92   Wt Readings from Last 3 Encounters:  02/28/22 214 lb (97.1 kg)  12/21/21 211 lb 12.8 oz (96.1 kg)  12/19/21 212 lb (96.2 kg)   BMI Readings from Last 3 Encounters:  02/28/22 33.52 kg/m  12/21/21 33.17 kg/m  12/19/21 33.20 kg/m    Allergies  Allergen Reactions   Lithium Anaphylaxis   Tegretol [Carbamazepine] Other (See Comments)    Fever and body aches (fever over [id number])   Tricyclic Antidepressants Anaphylaxis    Other reaction(s): Unknown   Methotrexate Derivatives Other (See Comments)    Urinary retention and dizziness  Other reaction(s): Other UTI   Strawberry Extract Hives   Tapentadol Rash    PT ALLERGIC NYLON TAPE    Codeine Nausea Only    Makes pt stay awake   Cymbalta [Duloxetine Hcl] Other (See Comments)    Makes pt pass out    Erythromycin     abdominal pain   Lyrica [Pregabalin]     Felt faint   Neurontin [Gabapentin]     Passes  out   Rabeprazole Sodium     insomnia   Baclofen Nausea Only    Sever nausea   Duraprep [Antiseptic Products, Misc.] Itching and Rash    Tolerates Betadine    Penicillins Rash    Tolerated ANCEF  02/02/20  Has patient had a PCN reaction causing immediate rash, facial/tongue/throat swelling, SOB or lightheadedness with hypotension: Yes Has patient had a PCN reaction causing severe rash involving mucus membranes or skin necrosis: No Has patient had a PCN reaction that required hospitalization No Has patient had a PCN reaction occurring within the last 10 years: No If all of the above answers are "NO", then may proceed with Cephalosporin use.    Tape Rash    PT ALLERGIC NYLON TAPE     Medications Reviewed Today     Reviewed by Charlton Haws, [facility] (Pharmacist) on 03/02/22 at 1455  Med List Status: <None>   Medication Order Taking? Sig Documenting Provider Last Dose Status Informant  albuterol (VENTOLIN HFA) 108 (90 Base) MCG/ACT inhaler [id number] Yes Inhale 1-2 puffs into the lungs every 4 (four) hours as needed for wheezing or shortness of breath. Melynda Ripple, MD Taking Active   Black Cohosh 40 MG CAPS [id number] Yes Take 1 capsule (40 mg total) by mouth daily. Constant, Peggy, MD Taking Active   budesonide-formoterol [facility]) 160-4.5 MCG/ACT inhaler [id number] Yes Inhale 2 puffs into the lungs 2 (two) times daily. Tower, Wynelle Fanny, MD Taking Active   buPROPion (WELLBUTRIN XL) 150 MG 24 hr tablet [id number] Yes Take 150 mg by mouth daily. [provider] Taking Active Self  busPIRone (BUSPAR) 15 MG tablet [id number] Yes Take 15 mg by mouth 2 (two) times daily before a meal. [provider] Taking Active Self  cholecalciferol (VITAMIN D) 25 MCG (1000 UNIT) tablet [id number] Yes Take 2,000 Units by mouth daily.  2 tablets daily [provider] Taking Active Self  clobetasol cream (TEMOVATE) 0.05 % [id number] Yes APPLY SMALL AMOUNT TO AFFECTED AREAS TWO TO THREE TIMES PER WEEK Anyanwu, Sallyanne Havers, MD Taking Active   cyanocobalamin 2000 MCG tablet [id number] Yes Take 1,000 mcg by mouth daily. [provider] Taking Active Self  diclofenac Sodium (VOLTAREN)  1 % GEL [id number] Yes Apply 2 g topically 2 (two) times daily as needed (pain). Nolberto Hanlon, MD Taking Active   diphenhydrAMINE (BENADRYL) 25 MG tablet [id number] Yes Take 25 mg by mouth every 6 (six) hours as needed for allergies. [provider] Taking Active Self  divalproex (DEPAKOTE ER) 500 MG 24 hr tablet [id number] Yes Take 500 mg by mouth at bedtime.  [provider] Taking Active Self  etanercept (ENBREL SURECLICK) 50 MG/ML injection [id number] Yes Inject 50 mg into the skin once a week. Collier Salina, MD Taking Active   fluticasone [facility]) 50 MCG/ACT nasal spray [id number] Yes PLACE 2 SPRAYS INTO THE NOSE DAILY. Deneise Lever, MD Taking Active   HYDROcodone-acetaminophen (NORCO/VICODIN) 5-325 MG tablet [id number] Yes Take 1 tablet by mouth 3 (three) times daily as needed for moderate pain. Mcarthur Rossetti, MD Taking Active   ipratropium-albuterol (DUONEB) 0.5-2.5 (3) MG/3ML SOLN [id number] Yes Take 3 mLs by nebulization every 6 (six) hours as needed. Deneise Lever, MD Taking Active            Med Note Jimmey Ralph, [facility]   Sun Aug 28, 2020 10:00 PM)    ketotifen (ZADITOR) 0.025 % ophthalmic solution [id number] Yes Place 1 drop into both eyes 2 (two) times daily as needed (allergies). [provider] Taking Active Self  levothyroxine (SYNTHROID) 50 MCG tablet [id number] Yes Take 1 tablet (50 mcg total) by mouth daily before breakfast. Tower, Wynelle Fanny, MD Taking Active   LORazepam (ATIVAN) 1 MG tablet [id number] Yes Take 1 mg by mouth at bedtime as needed for anxiety or sleep. [provider] Taking Active Self  metoprolol succinate (TOPROL-XL) 25 MG 24 hr tablet [id number] Yes Take 0.5 tablets (12.5 mg total) by mouth in the morning and at bedtime. Tower, Wynelle Fanny, MD Taking Active   mirabegron ER (MYRBETRIQ) 50 MG TB24 tablet [id number] Yes Take 1 tablet (50 mg total) by mouth daily. Tower, Wynelle Fanny, MD Taking Active   mirtazapine (REMERON) 30  MG tablet [id number] Yes Take 30 mg by mouth at bedtime.  [provider] Taking Active Self           Med Note Damita Dunnings, [facility] Rising   Fri Apr 04, 2018  8:11 AM)    ondansetron (ZOFRAN-ODT) 4 MG disintegrating tablet [id number] Yes Take 1 tablet (4 mg total) by mouth every 8 (eight) hours as needed for nausea or vomiting. Tower, Wynelle Fanny, MD Taking Active   predniSONE (DELTASONE) 10 MG tablet [id number] Yes TAKE ONE TABLET BY MOUTH DAILY AS DIRECTED Rice, Resa Miner, MD Taking Active   Rhubarb (ESTROVEN COMPLETE) 4 MG TABS [id number] Yes Take 4 mg by mouth daily. Constant, Peggy, MD Taking Active   sertraline (ZOLOFT) 100 MG tablet [id number] Yes Take 100 mg by mouth daily.  [provider] Taking Active Self           Med Note Damita Dunnings, [facility]   Fri Apr 04, 2018  8:12 AM)    simvastatin (ZOCOR) 20 MG tablet [id number] Yes TAKE ONE TABLET BY MOUTH EVERY NIGHT AT BEDTIME Tower, Wynelle Fanny, MD Taking Active  Spacer/Aero-Holding Chambers (AEROCHAMBER PLUS) inhaler [id number] Yes Use with inhaler Melynda Ripple, MD Taking Active   sulfaSALAzine (AZULFIDINE) 500 MG tablet [id number] Yes Take 2 tablets (1,000 mg total) by mouth 2 (two) times daily. Collier Salina, MD Taking Active   traMADol Veatrice Bourbon) 50 MG tablet [id number] Yes Take 50 mg by mouth every 6 (six) hours as needed. 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   Bilateral leg weakness 10/09/2021   Vitamin B12 deficiency 08/02/2021   Myofascial pain dysfunction syndrome 08/02/2021   Adverse effect of prednisone 07/11/2021   Prediabetes 07/11/2021   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   History of pulmonary embolism 12/08/2020   Arthritis of knee 12/06/2020   Bilateral knee pain 09/20/2020   Seronegative rheumatoid arthritis () 09/20/2020    High risk medication use 09/20/2020   Extensor tenosynovitis of right wrist 09/15/2020   S/P reverse total shoulder arthroplasty, 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   Lung nodule 01/02/2020   Status post arthroscopy of left shoulder 01/16/2018   Estrogen deficiency 06/28/2017   Medial epicondylitis, left elbow 04/29/2017   S/P arthroscopy of left shoulder 02/25/2017   Impingement syndrome of left shoulder 01/30/2017   Pedal edema 10/26/2016   Trochanteric bursitis, right hip 04/10/2016   Essential hypertension 09/02/2015   Urge incontinence 07/27/2015   Obesity 04/28/2015   Screening for HIV (human immunodeficiency virus) 04/26/2015   Osteoarthritis of left knee 01/28/2015   Status post total left knee replacement 01/28/2015   Vitamin D deficiency 04/27/2014   Seasonal and perennial allergic rhinitis 06/17/2013   S/P total hip arthroplasty 03/20/2013   Medicare annual wellness visit, subsequent 11/18/2012   History of falling 11/18/2012   Routine general medical examination at a health care facility [id number]   Lichen sclerosus et atrophicus of the vulva 07/29/2012   Osteopenia 10/19/2011   Hypothyroidism 10/19/2011   ANEMIA 10/11/2009   Hereditary and idiopathic peripheral neuropathy 08/05/2009   Low back pain 08/05/2009   HAND PAIN, BILATERAL 08/05/2009   TREMOR 03/09/2008   MENOPAUSAL SYNDROME 10/09/2007   DEPRESSION, MAJOR 05/20/2007   BIPOLAR AFFECTIVE DISORDER 05/20/2007   Generalized anxiety disorder 05/20/2007   PERSONALITY DISORDER 05/20/2007   ESOPHAGEAL SPASM 05/20/2007   Diaphragmatic hernia 05/20/2007   AMAUROSIS FUGAX 05/07/2007   COPD mixed type ([place]) 03/27/2007   HYPERCHOLESTEROLEMIA, PURE 12/10/2006   SYMPTOM, SYNDROME, CHRONIC FATIGUE 12/10/2006    Immunization History  Administered Date(s) Administered   Influenza Split 11/19/2011, 11/02/2013   Influenza Whole 12/20/2006, 11/25/2008, 11/26/2009, 11/24/2010   Influenza, High Dose Seasonal PF 12/13/2016    Influenza,inj,Quad PF,6+ Mos 12/06/2015, 11/28/2021   Influenza-Unspecified 12/08/2012, 12/03/2014, 12/08/2017, 11/10/2018, 10/31/2019   Moderna Sars-Covid-2 Vaccination 06/21/2019, 07/19/2019, 01/22/2020   PFIZER(Purple Top)SARS-COV-2 Vaccination 06/04/2020   Pfizer Covid-19 Vaccine Bivalent Booster 73yrs & up 12/29/2021   Pneumococcal Conjugate-13 12/06/2015   Pneumococcal Polysaccharide-23 01/26/2002, 03/23/2008, 09/07/2010, 06/28/2017   Respiratory Syncytial Virus Vaccine,Recomb Aduvanted(Arexvy) 12/29/2021   Td 06/26/2000, 10/19/2011, 02/14/2022   Zoster Recombinat (Shingrix) 09/16/2017     Compliance/Adherence/Medication fill history: Care Gaps: Lung cancer screening (12/14/20)  Star-Rating Drugs: Simvastatin - PDC 100%  SDOH:  (Social Determinants of Health) assessments and interventions performed: Yes SDOH Interventions    Flowsheet Row Care Coordination from 03/02/2022 in [place] from 07/10/2021 in [employer]  at [place] [facility] from 05/15/2021 in [place] at [place] from 06/30/2018 in [place] [facility] [place] from 05/09/2016 in [place] at [place] Interventions -- Intervention Not Indicated -- -- --  Housing Interventions -- Intervention Not Indicated Intervention Not Indicated -- --  Transportation Interventions -- Intervention Not Indicated -- -- --  Depression Interventions/Treatment  -- Medication, Currently on Treatment -- PHQ2-9 Score <4 Follow-up Not Indicated Currently on Treatment, Counseling, Medication  [per pt, she is under care of both psychiatrist and psychologist]  Financial Strain Interventions Other (Comment)  [applied to LIS/extra help. Also discussed refilling generics and holding off on refilling Mybetriq] Intervention Not Indicated Other (Comment)  [Symbicort - enrolled AZ&Me,  provided  info for LIS] -- --  Physical Activity Interventions -- Patient Refused -- -- --  Stress Interventions -- Intervention Not Indicated -- -- --  Social Connections Interventions -- Intervention Not Indicated -- -- --      SDOH Screenings   Food Insecurity: No Food Insecurity (07/10/2021)  Housing: Low Risk  (07/10/2021)  Transportation Needs: No Transportation Needs (07/10/2021)  Alcohol Screen: Low Risk  (07/10/2021)  Depression (PHQ2-9): Low Risk  (08/02/2021)  Recent Concern: Depression (PHQ2-9) - Medium Risk (07/11/2021)  Financial Resource Strain: High Risk (03/02/2022)  Physical Activity: Inactive (07/10/2021)  Social Connections: Moderately Isolated (07/10/2021)  Stress: No Stress Concern Present (07/10/2021)  Tobacco Use: Medium Risk (02/28/2022)    Medication Assistance:  Symbicort - AZ&Me approved 2023. Patient given website and phone number to apply for LIS (Extra Help)  Medication Access: Within the past 30 days, how often has patient missed a dose of medication? 0 Is a pillbox or other method used to improve adherence? Yes  Factors that may affect medication adherence? financial need Are meds synced by current pharmacy? No  Are meds delivered by current pharmacy? No  Does patient experience delays in picking up medications due to transportation concerns? No   [facility] Services Reviewed: Is patient disadvantaged to use [facility]?: Yes  Current Rx insurance plan: [facility] PDP Name and location of Current pharmacy:  [facility] [id number] - [place], [place] [address] Phone: [phone] Fax: [phone]  [facility] - [address] [place] [address] Phone: [phone] Fax: [phone]  [facility] services reviewed with patient today?: No  Patient requests to transfer care to [facility] Pharm[facility]?: No  Reason patient declined to change  pharmacies: Disadvantaged due to insurance/mail order   Assessment/Plan  Hypertension / CKD Stage 3a (BP goal <130/80) -Controlled - BP at goal in recent clinic visits -Current home BP readings: SBP 120s-140s -Denies hypotensive/hypertensive symptoms -Current treatment: Metoprolol succinate 25 mg - 1/2 tab BID - Appropriate, Effective, Safe, Accessible -Medications previously tried: amlodipine, furosemide  -Current dietary habits: not discussed -Current exercise habits: limited -Educated on BP goals and benefits of medications for prevention of heart attack, stroke and kidney damage; Importance of home blood pressure monitoring; Symptoms of hypotension and importance of maintaining adequate hydration; -Counseled to monitor BP at home daily -Recommended to continue current medication  Hyperlipidemia: (LDL goal < 100) -Not ideally controlled - LDL 123 (06/2021), which is the highest it has been in over a decade; pt endorses compliance with statin -Current treatment: Simvastatin 20 mg daily HS - Appropriate, Effective, Safe, Accessible -  Medications previously tried: n/a  -Educated on Cholesterol goals; Benefits of statin for ASCVD risk reduction; -Consider optimizing statin therapy at follow up  COPD (Goal: control symptoms and prevent exacerbations) -Controlled - pt reports improved breathing while on Symbicort, now accessible through AZ&Me, however after 02/25/22 Symbicort will be removed from AZ&Me and pt cannot afford the copay; discussed Judithann Sauger is still available through PAP and she may benefit from upgrading to triple therapy clinically; she has 2 Symbicort inhalers remaining and can switch when those run out (~Feb 2024) -Gold Grade: unable to assess -Current COPD Classification:  A (low sx, <2 exacerbations/yr) -Pulmonary function testing: 10/27/10- FEV1 1.54, FEV1/FVC 0.63, FEF 25-75% 0.84 -Exacerbations requiring treatment in last 6 months: 1 -Current treatment  Albuterol HFA  -Appropriate, Effective, Safe, Accessible Symbicort 160-4.5 mcg/act 2 puff BID (PAP) -Appropriate, Effective, Safe, Accessible Duoneb PRN -Appropriate, Effective, Safe, Accessible -Medications previously tried: n/a  -Patient denies consistent use of maintenance inhaler -Frequency of rescue inhaler use: 1-2x daily -Counseled on Proper inhaler technique; Benefits of consistent maintenance inhaler use -Recommend to change Symbicort to [employer]. Enrolled in AZ&Me for [facility] 2024.  Bipolar disorder / Depression / Anxiety (Goal: manage symptoms) -Controlled - per patient report; she reports every time they try to change her meds "something goes wrong" and they change it back -PHQ9: 0 (06/2020) - minimal depression -GAD7: not on file -Connected with Dr Pearson Grippe for mental health support -Current treatment: Bupropion XL 150 mg daily -Appropriate, Effective, Safe, Accessible Buspirone 15 mg BID -Appropriate, Effective, Safe, Accessible Mirtazapine 30 mg HS -Appropriate, Effective, Safe, Accessible Sertraline 100 mg daily -Appropriate, Effective, Safe, Accessible Divalproex ER 500 mg daily HS -Appropriate, Effective, Safe, Accessible Lorazepam 1 mg HS prn -Appropriate, Effective, Safe, Accessible -Medications previously tried/failed: n/a -Educated on Benefits of medication for symptom control -Recommended to continue current medication  Osteoporosis screening (Goal prevent fractures) -Controlled - due for DEXA. On chronic prednisone and high fall risk -Last DEXA Scan: 06/2017   T-Score femoral neck: -0.4  T-Score lumbar spine: +0.4  10-year probability of major osteoporotic fracture: n/a  10-year probability of hip fracture: n/a -Patient is not a candidate for pharmacologic treatment -Current treatment  Vitamin D 1000 IU - 2 daily - Appropriate, Effective, Safe, Accessible -Medications previously tried: n/a  -Recommend weight-bearing and muscle strengthening exercises for building and  maintaining bone density. -Recommend Repeat DEXA when able  Hypothyroidism (Goal: maintain TSH in goal range) -Controlled - TSH 3.86 (06/2021) at goal -Current treatment  Levothyroxine 50 mcg HS - Appropriate, Effective, Safe, Accessible -Medications previously tried: n/a  -Recommended to continue current medication  Anemia (Goal: maintain HGB) -Controlled- HGB 12.2 (06/2021) at goal; she is now off of iron after iron/HGB levels normalized -Current treatment  Vitamin B12 2000 mcg daily - Appropriate, Query Effective -Medications previously tried: Niferex -Recommend to continue current medication  OAB (Goal: manage symptoms) -Controlled - pt reports higher dose of Myrbetriq is working reasonably well, she would like to take 25 mg BID however because she feels it wears off in the daytime, she is going to check with her insurance to see if they will cover 25 mg BID -Current treatment  Myrbetriq ER 50 mg daily HS - Appropriate, Effective, Safe, Not Accessible -Medications previously tried: n/a  [employer] [employer] has changed 2024 and Myrbetriq is not affordable; Discussed alternatives to Myrbetriq - oxbutynin and trospium are the preferred antimuscarinics with her plan, she did not want to try these right now -Recommend to continue current medication  Rheumatoid Arthritis (Goal: manage pain) -Controlled- pt reports Enbrel is a "miracle drug" for her, pain is much improved and she is in process of tapering off prednisone -Pt reports leflunomide side effects: diarrhea, muscle pain, headaches, chills -Follows with Dr Benjamine Mola [facility]) -Current treatment  Voltaren gel - Appropriate, Effective, Safe, Accessible Hydrocodone-APAP 5-325 mg TID PRN -Appropriate, Effective, Safe, Accessible Enbrel 50 mg weekly -Appropriate, Effective, Safe, Accessible Prednisone 10 mg daily - Appropriate, Effective, Safe, Accessible -Medications previously tried: plaquenil, methotrexate; neuropathy- baclofen,  gabepntin, pregabalin -Recommended to continue current medication  Chronic pain (Goal: manage pain) -Not ideally controlled - pt endorses medication do not help much with pain, she has nerve stimulator procedure scheduled for Jan 2024 -Degenerative disc disease, lumbar radiculopathy, RA; query neuropathy - pt has not tolerated any neuralgia drugs -Established with Dr Landry Mellow, [facility] -Current treatment  Prednisone 10 mg daily - Appropriate, Effective, Query Safe Tramadol 50 mg q6h PRN -Appropriate, Query Effective Voltaren gel - Appropriate, Effective, Safe, Accessible -Medications previously tried: tegretol, gabapentin, pregabalin, duloxetine, trileptal, butrans, belbuca, tramadol  -Recommended to continue current medication  Postmenopausal symptoms (Goal: manage symptoms) -Not ideally controlled - pt stopped Premarin after last visit due to VTE risk; she has started supplements and reports they are not helping much with hot flashes but understands that it can take several months for effect -Current treatment  Black Cohosh - Appropriate, Query Effective, Safe, Accessible Rhubarb/Estroven -Appropriate, Query Effective, Safe, Accessible -Medications previously tried: Premarin -Discussed risk of VTE "clot" with hormone replacement; 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; furthermore pt has not been able to tolerate anticoagulation so would consider her risk even higher for developing a clot  -Recommend to continue current medication  Health Maintenance -Vaccine gaps: Shingrix #2 -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, pt will receive letter from [facility] with outcome over the next several weeks -Pt also got new insurance this year [facility] PDP) with high deductible, so she cannot afford Myrbetriq refill (was told it would be over $500); she also though generic drugs would be high cost with deductible, but discussed deductible does not apply to generics so these should be reasonable -Discussed alternatives to Myrbetriq - oxbutynin and trospium are the preferred antimuscarinics with her plan, she did not want to try these right now -Advised pt to refill generic medications as the come due; if she has any issues with cost of specific meds advised she contact me  Charlene Brooke, PharmD, BCACP Clinical Pharmacist [facility] [phone]

## 2022-03-02 NOTE — Telephone Encounter (Signed)
Called and left a VM for patient to CB to schedule for gel injection with Dr. Blackman or Gil.  Check referral tab  

## 2022-03-03 ENCOUNTER — Other Ambulatory Visit: Payer: Self-pay | Admitting: Family Medicine

## 2022-03-05 ENCOUNTER — Encounter: Payer: Self-pay | Admitting: Family Medicine

## 2022-03-06 MED ORDER — OXYBUTYNIN CHLORIDE 5 MG PO TABS: 5.0000 mg | ORAL_TABLET | Freq: Two times a day (BID) | ORAL | 1 refills | Status: DC

## 2022-03-06 NOTE — Telephone Encounter (Signed)
I sent the oxybutinin low dose to try  Thanks

## 2022-03-06 NOTE — Telephone Encounter (Signed)
I had discussed Myrbetriq alternatives with her last week, there is unfortunately not any assistance available with Myrbetriq or Gemtesa. The preferred anticholinergics with her plan are trospium or oxybutynin, we can try one of those but they may cause dry mouth, constipation, blurred vision etc.   She told me she is getting a new insurance plan that will be changing over on February 1st, I had originally advised her to wait to refill Myrbetriq until then. We could potentially try an anticholinergic in the meantime.  Dr Glori Bickers - what do you think?

## 2022-03-07 NOTE — Telephone Encounter (Signed)
Received notification from [facility] regarding a prior authorization for ENBREL. Authorization has been APPROVED from 03/07/2022 until further notice. Approval letter sent to scan center.  Authorization # [id number]  Faxed approval letter to [employer] today. Fax: [phone]  Knox Saliva, PharmD, MPH, BCPS, CPP Clinical Pharmacist (Rheumatology and Pulmonology)

## 2022-03-07 NOTE — Telephone Encounter (Signed)
Submitted an URGENT Prior Authorization request to [facility] for ENBREL via CoverMyMeds. Will update once we receive a response.  Key: [id number]  Once approved, will need to be faxed to [employer] asap to allowing processing of Enbrel PAP renewal for 2024.  Knox Saliva, PharmD, MPH, BCPS, CPP Clinical Pharmacist (Rheumatology and Pulmonology)

## 2022-03-09 NOTE — Telephone Encounter (Signed)
Received a fax from  [facility] regarding an approval for ENBREL patient assistance from 03/09/2022 to 02/26/2023. Approval letter sent to scan center.  Phone number: [phone]  MyChart message sent to pt to advise  Knox Saliva, PharmD, MPH, BCPS, CPP Clinical Pharmacist (Rheumatology and Pulmonology)

## 2022-03-15 ENCOUNTER — Encounter: Payer: Self-pay | Admitting: Physician Assistant

## 2022-03-15 ENCOUNTER — Ambulatory Visit (INDEPENDENT_AMBULATORY_CARE_PROVIDER_SITE_OTHER): Payer: Medicare Other | Admitting: Physician Assistant

## 2022-03-15 DIAGNOSIS — M1711 Unilateral primary osteoarthritis, right knee: Secondary | ICD-10-CM

## 2022-03-15 MED ORDER — HYALURONAN 88 MG/4ML IX SOSY
88.0000 mg | PREFILLED_SYRINGE | INTRA_ARTICULAR | Status: AC | PRN
  Administered 2022-03-15: 88 mg via INTRA_ARTICULAR

## 2022-03-15 NOTE — Progress Notes (Signed)
   Procedure Note  Patient: Lori Orozco             Date of Birth: 1950-10-31           MRN: [id number]             Visit Date: 03/15/2022  HPI: Ms. Lori Orozco comes in today for scheduled Monovisc injection right knee.  Last Monovisc injection gave her some relief but did not take her pain away completely.  She denies any new injury to the knee.  Has known osteoarthritis of the right knee.  She is now ambulating with a walker due to the knee pain.  She has no scheduled surgery on the right knee in the next 6 months.  Physical exam: Right knee no abnormal warmth erythema or effusion.  Good range of motion of the knee.  Global tenderness about the knee with any attempts at palpation.  Procedures: Visit Diagnoses:  1. Primary osteoarthritis of right knee     Large Joint Inj: R knee on 03/15/2022 2:31 PM Indications: pain Details: 22 G 1.5 in needle, superolateral approach  Arthrogram: No  Medications: 88 mg Hyaluronan 88 MG/4ML Outcome: tolerated well, no immediate complications Procedure, treatment alternatives, risks and benefits explained, specific risks discussed. Consent was given by the patient. Immediately prior to procedure a time out was called to verify the correct patient, procedure, equipment, support staff and site/side marked as required. Patient was prepped and draped in the usual sterile fashion.     Plan: She knows to wait least 6 months between supplemental injections.  She will follow-up with [place] as needed.  Questions encouraged

## 2022-03-19 ENCOUNTER — Telehealth: Payer: Self-pay | Admitting: Internal Medicine

## 2022-03-19 DIAGNOSIS — M0600 Rheumatoid arthritis without rheumatoid factor, unspecified site: Secondary | ICD-10-CM

## 2022-03-19 DIAGNOSIS — Z79899 Other long term (current) drug therapy: Secondary | ICD-10-CM

## 2022-03-19 NOTE — Telephone Encounter (Signed)
Pt requested lab orders for [facility]. Pt is expected to do labs this afternoon or tomorrow.

## 2022-03-19 NOTE — Addendum Note (Signed)
Addended by: Carole Binning on: 03/19/2022 12:06 PM   Modules accepted: Orders

## 2022-03-19 NOTE — Addendum Note (Signed)
Addended by: Carole Binning on: 03/19/2022 12:07 PM   Modules accepted: Orders

## 2022-03-19 NOTE — Telephone Encounter (Signed)
Lab Orders released.  

## 2022-03-21 LAB — CBC WITH DIFFERENTIAL/PLATELET
Basophils Absolute: 0 10*3/uL (ref 0.0–0.2)
Basos: 0 %
EOS (ABSOLUTE): 0 10*3/uL (ref 0.0–0.4)
Eos: 1 %
Hematocrit: 37.3 % (ref 34.0–46.6)
Hemoglobin: 11.7 g/dL (ref 11.1–15.9)
Immature Grans (Abs): 0 10*3/uL (ref 0.0–0.1)
Immature Granulocytes: 1 %
Lymphocytes Absolute: 2.5 10*3/uL (ref 0.7–3.1)
Lymphs: 30 %
MCH: 31.6 pg (ref 26.6–33.0)
MCHC: 31.4 g/dL — ABNORMAL LOW (ref 31.5–35.7)
MCV: 101 fL — ABNORMAL HIGH (ref 79–97)
Monocytes Absolute: 0.6 10*3/uL (ref 0.1–0.9)
Monocytes: 8 %
Neutrophils Absolute: 5.2 10*3/uL (ref 1.4–7.0)
Neutrophils: 60 %
Platelets: 263 10*3/uL (ref 150–450)
RBC: 3.7 x10E6/uL — ABNORMAL LOW (ref 3.77–5.28)
RDW: 13.5 % (ref 11.7–15.4)
WBC: 8.4 10*3/uL (ref 3.4–10.8)

## 2022-03-21 LAB — CMP14+EGFR
ALT: 8 IU/L (ref 0–32)
AST: 16 IU/L (ref 0–40)
Albumin/Globulin Ratio: 1.7 (ref 1.2–2.2)
Albumin: 4.1 g/dL (ref 3.8–4.8)
Alkaline Phosphatase: 61 IU/L (ref 44–121)
BUN/Creatinine Ratio: 26 (ref 12–28)
BUN: 28 mg/dL — ABNORMAL HIGH (ref 8–27)
Bilirubin Total: 0.4 mg/dL (ref 0.0–1.2)
CO2: 24 mmol/L (ref 20–29)
Calcium: 9.7 mg/dL (ref 8.7–10.3)
Chloride: 100 mmol/L (ref 96–106)
Creatinine, Ser: 1.09 mg/dL — ABNORMAL HIGH (ref 0.57–1.00)
Globulin, Total: 2.4 g/dL (ref 1.5–4.5)
Glucose: 79 mg/dL (ref 70–99)
Potassium: 4.4 mmol/L (ref 3.5–5.2)
Sodium: 142 mmol/L (ref 134–144)
Total Protein: 6.5 g/dL (ref 6.0–8.5)
eGFR: 54 mL/min/{1.73_m2} — ABNORMAL LOW (ref >59)

## 2022-03-21 LAB — C-REACTIVE PROTEIN: CRP: 8 mg/L (ref 0–10)

## 2022-03-21 LAB — SEDIMENTATION RATE: Sed Rate: 32 mm/hr (ref 0–40)

## 2022-03-21 NOTE — Progress Notes (Signed)
Lab results look fine for continuing the Enbrel. Her inflammation numbers are now normal on the prednisone and Enbrel right now and can continue her current medication.

## 2022-03-22 ENCOUNTER — Encounter: Payer: Self-pay | Admitting: Family Medicine

## 2022-03-23 NOTE — Telephone Encounter (Signed)
Patient called and asked if the medication have been sent.

## 2022-03-24 ENCOUNTER — Telehealth: Payer: Self-pay | Admitting: Family Medicine

## 2022-03-24 ENCOUNTER — Encounter: Payer: Self-pay | Admitting: Family Medicine

## 2022-03-24 MED ORDER — MIRABEGRON ER 50 MG PO TB24: 50.0000 mg | ORAL_TABLET | Freq: Every day | ORAL | 3 refills | Status: DC

## 2022-03-24 NOTE — Telephone Encounter (Signed)
Sent myrbetriq to pharmacy

## 2022-03-29 HISTORY — PX: IMPLANTATION VAGAL NERVE STIMULATOR: SUR692

## 2022-04-02 ENCOUNTER — Telehealth: Payer: Self-pay

## 2022-04-02 NOTE — Progress Notes (Signed)
Care Management & Coordination Services Pharmacy Team  Reason for Encounter: Appointment Reminder  Contacted patient to confirm telephone appointment with Charlene Brooke, PharmD on 04/05/22 at 3:00. Spoke with patient on 04/02/2022   Do you have any problems getting your medications? No   What is your top health concern you would like to discuss at your upcoming visit? No concerns at this time  Have you seen any other providers since your last visit with PCP? Yes- orthopedic  Hospital visits:  None in previous 6 months   Star Rating Drugs:  Medication:  Last Fill: Day Supply Simvastatin 20mg  02/04/22 90   Care Gaps: Annual wellness visit in last year? Yes    Charlene Brooke, PharmD notified  Lori Orozco, [place] Assistant [phone]

## 2022-04-05 ENCOUNTER — Ambulatory Visit: Payer: Medicare Other | Admitting: Pharmacist

## 2022-04-05 ENCOUNTER — Telehealth: Payer: Self-pay | Admitting: Pharmacist

## 2022-04-05 MED ORDER — BREZTRI AEROSPHERE 160-9-4.8 MCG/ACT IN AERO: 2.0000 | INHALATION_SPRAY | Freq: Two times a day (BID) | RESPIRATORY_TRACT | 3 refills | Status: DC

## 2022-04-05 NOTE — Telephone Encounter (Signed)
Patient has been taking Symbicort and was able to afford this by getting it through [employer] PAP in 2023. In 2024, [employer] removed Symbicort from their program so she will not be able to continue receiving Symbicort this way.  [employer] still includes Breztri in their program, if pt upgrades she will be able to get this for free in 2024. Rx can be sent to pharmacy:  [facility], [address] [place] [address] Phone: [phone] Fax: [phone]

## 2022-04-05 NOTE — Patient Instructions (Signed)
Visit Information  Phone number for Pharmacist: [phone]  Thank you for meeting with me to discuss your medications! Below is a summary of what we talked about during the visit:   Recommendations/Changes made from today's visit: -Upgrade Symbicort to [place] to receive thruogh [employer] PAP  Follow up plan: -Pharmacist follow up televisit scheduled for 1 month -PCP appt 07/13/22; Rheumatology appt 05/30/22   Charlene Brooke, PharmD, BCACP Clinical Pharmacist [facility] [phone]

## 2022-04-05 NOTE — Telephone Encounter (Signed)
Yes please

## 2022-04-05 NOTE — Progress Notes (Signed)
Care Management & Coordination Services Pharmacy Note  04/05/2022 Name:  Lori Orozco MRN:  [id number] DOB:  September 19, 1950  Summary: F/U visit -Pt has changed insurance plans and been approved for Extra Help, so medications are affordable now (Max copay $11/month) -Pt cannot get Symbciort through PAP this year as it was removed from the program, but if she upgrades to [place] she can get through free through PAP   Recommendations/Changes made from today's visit: -Upgrade Symbicort to [place] to receive thruogh AZ&Me PAP  Follow up plan: -Pharmacist follow up televisit scheduled for 1 month -PCP appt 07/13/22; Rheumatology appt 05/30/22    Subjective: Lori Orozco is an 72 y.o. year old female who is a primary patient of Tower, Audrie Gallus, MD.  The care coordination team was consulted for assistance with disease management and care coordination needs.    Engaged with patient by telephone for follow up visit.  Patient Care Team: Tower, Audrie Gallus, MD as PCP - Lorelle Gibbs, MD as Consulting Physician (Ophthalmology) Phillip Heal, MD as Referring Physician (Psychiatry) Allie Bossier, MD as Consulting Physician (Obstetrics and Gynecology) Waymon Budge, MD as Consulting Physician (Pulmonary Disease) [name], MD as Consulting Physician (Orthopedic Surgery) [name], MD as Consulting Physician (Orthopedic Surgery) Tyrell Antonio, MD as Consulting Physician (Physical Medicine and Rehabilitation) Ronalee Belts, MD as Referring Physician (Otolaryngology) Doran Heater, PhD as Referring Physician (Psychology) Kathyrn Sheriff, [facility] as Pharmacist (Pharmacist)  Recent office visits: 11/28/21 Dr Milinda Antis OV: hx of falling; need repeat DEXA when well enough; 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   08/16/21 pt message - lyrica causing hand swelling. Ok to stop. 08/02/21 Dr Milinda Antis OV: neuropathy - trial pregabalin 25 mg. Reduce B12 to 1/2 or QOD. 07/11/21 Dr Milinda Antis OV:  annual exam - labs stable. No changes.  Recent consult visits: 03/15/22 PA Chestine Spore (Ortho): knee injection 02/28/22 Dr Dimple Casey (Rheumatology): f/u - d/c butrans patch, diazepam. Update sulfasalazine Rx to 1000 mg BID - pt has been off med 2 weeks d/t cost.  02/08/22 Dr Deeann Cree (Pain mgmt): advised not to take Trileptal given hx fevers on Tegretol and currently on depakote. Transitioning to opioids.   01/11/22 Dr Richardson Dopp (Pain mgmt): Stop Butrans. Start Trileptal 150 mg BID.   12/25/21 Dr Richardson Dopp (Pain mgmt): Order CRP, Sed rate. If negative order MRI spine. Increase Butrans to 20 mcg/hr q7 days. Failed tramadol and belbuca.    12/21/21 Dr Dimple Casey (Rheumatology): RA - start sulfasalazine.    12/19/21 Dr Maple Hudson (Pulm): COPD - no changes   10/09/21 Dr Dimple Casey (Rheumatology): RA - failed prednisone taper. Add leflunomide.  07/05/21 Dr Dimple Casey (Rheumatology): RA. Improved with Enbrel. Start tapering prednisone to 10 mg. Consider leflunomide in future if needed for steroid-sparing.  Hospital visits: None in previous 6 months   Objective:  Lab Results  Component Value Date   CREATININE 1.09 (H) 03/20/2022   BUN 28 (H) 03/20/2022   GFR 50.71 (L) 07/11/2021   EGFR 54 (L) 03/20/2022   GFRNONAA >60 09/06/2020   GFRAA >60 01/29/2015   NA 142 03/20/2022   K 4.4 03/20/2022   CALCIUM 9.7 03/20/2022   CO2 24 03/20/2022   GLUCOSE 79 03/20/2022    Lab Results  Component Value Date/Time   HGBA1C 5.4 07/11/2021 09:38 AM   HGBA1C 5.5 08/29/2020 04:12 AM   GFR 50.71 (L) 07/11/2021 09:38 AM   GFR 49.15 (L) 05/16/2021 03:26 PM    Last diabetic Eye exam: No results found  for: "HMDIABEYEEXA"  Last diabetic Foot exam: No results found for: "HMDIABFOOTEX"   Lab Results  Component Value Date   CHOL 221 (H) 07/11/2021   HDL 70.50 07/11/2021   LDLCALC 123 (H) 07/11/2021   TRIG 136.0 07/11/2021   CHOLHDL 3 07/11/2021       Latest Ref Rng & Units 03/20/2022   10:40 AM 12/21/2021   11:41 AM 10/09/2021    1:32 PM   Hepatic Function  Total Protein 6.0 - 8.5 g/dL 6.5  6.5  6.7   Albumin 3.8 - 4.8 g/dL 4.1     AST 0 - 40 IU/L 16  15  20    ALT 0 - 32 IU/L 8  11  18    Alk Phosphatase 44 - 121 IU/L 61     Total Bilirubin 0.0 - 1.2 mg/dL 0.4  0.4  0.6     Lab Results  Component Value Date/Time   TSH 3.86 07/11/2021 09:38 AM   TSH 5.59 (H) 05/16/2021 03:26 PM   FREET4 1.08 11/19/2011 03:23 PM       Latest Ref Rng & Units 03/20/2022   10:40 AM 12/21/2021   11:41 AM 10/09/2021    1:32 PM  CBC  WBC 3.4 - 10.8 x10E3/uL 8.4  11.4  9.7   Hemoglobin 11.1 - 15.9 g/dL 12/23/2021  10/11/2021  77.4   Hematocrit 34.0 - 46.6 % 37.3  38.7  37.7   Platelets 150 - 450 x10E3/uL 263  224  236     Lab Results  Component Value Date/Time   VD25OH 47.24 07/11/2021 09:38 AM   VD25OH 52.85 07/08/2020 09:11 AM   VITAMINB12 >1504 (H) 08/02/2021 02:43 PM   VITAMINB12 298 08/05/2009 07:57 PM    Clinical ASCVD: No  The 10-year ASCVD risk score (Arnett DK, et al., 2019) is: 9.5%   Values used to calculate the score:     Age: 72 years     Sex: Female     Is Non-Hispanic African American: No     Diabetic: No     Tobacco smoker: No     Systolic Blood Pressure: 105 mmHg     Is BP treated: Yes     HDL Cholesterol: 70.5 mg/dL     Total Cholesterol: 221 mg/dL       2020    62 PM 07/11/2021    8:50 AM 07/10/2021    3:50 PM  Depression screen PHQ 2/9  Decreased Interest 0 0 0  Down, Depressed, Hopeless 3 0 3  PHQ - 2 Score 3 0 3  Altered sleeping  0 0  Tired, decreased energy  3 1  Change in appetite  3 0  Feeling bad or failure about yourself   0 0  Trouble concentrating  1 0  Moving slowly or fidgety/restless   0  Suicidal thoughts  0 0  PHQ-9 Score  7 4  Difficult doing work/chores   Not difficult at all     Social History   Tobacco Use  Smoking Status Former   Packs/day: 2.00   Years: 40.00   Total pack years: 80.00   Types: E-cigarettes, Cigarettes   Quit date: 2015   Years since quitting: 9.1    Passive exposure: Never  Smokeless Tobacco Never   BP Readings from Last 3 Encounters:  02/28/22 126/69  12/21/21 122/78  12/19/21 (!) 144/82   Pulse Readings from Last 3 Encounters:  02/28/22 75  12/21/21 73  12/19/21 92   Wt Readings  from Last 3 Encounters:  02/28/22 214 lb (97.1 kg)  12/21/21 211 lb 12.8 oz (96.1 kg)  12/19/21 212 lb (96.2 kg)   BMI Readings from Last 3 Encounters:  02/28/22 33.52 kg/m  12/21/21 33.17 kg/m  12/19/21 33.20 kg/m    Allergies  Allergen Reactions   Lithium Anaphylaxis   Tegretol [Carbamazepine] Other (See Comments)    Fever and body aches (fever over 353)   Tricyclic Antidepressants Anaphylaxis    Other reaction(s): Unknown   Methotrexate Derivatives Other (See Comments)    Urinary retention and dizziness  Other reaction(s): Other UTI   Strawberry Extract Hives   Tapentadol Rash    PT ALLERGIC NYLON TAPE    Codeine Nausea Only    Makes pt stay awake   Cymbalta [Duloxetine Hcl] Other (See Comments)    Makes pt pass out    Erythromycin     abdominal pain   Lyrica [Pregabalin]     Felt faint   Neurontin [Gabapentin]     Passes  out   Rabeprazole Sodium     insomnia   Baclofen Nausea Only    Sever nausea   Duraprep [Antiseptic Products, Misc.] Itching and Rash    Tolerates Betadine    Penicillins Rash    Tolerated ANCEF 02/02/20  Has patient had a PCN reaction causing immediate rash, facial/tongue/throat swelling, SOB or lightheadedness with hypotension: Yes Has patient had a PCN reaction causing severe rash involving mucus membranes or skin necrosis: No Has patient had a PCN reaction that required hospitalization No Has patient had a PCN reaction occurring within the last 10 years: No If all of the above answers are "NO", then may proceed with Cephalosporin use.    Tape Rash    PT ALLERGIC NYLON TAPE     Medications Reviewed Today     Reviewed by Charlton Haws, [facility] (Pharmacist) on 04/05/22 at 1526  Med List  Status: <None>   Medication Order Taking? Sig Documenting Provider Last Dose Status Informant  albuterol (VENTOLIN HFA) 108 (90 Base) MCG/ACT inhaler [id number] Yes Inhale 1-2 puffs into the lungs every 4 (four) hours as needed for wheezing or shortness of breath. Melynda Ripple, MD Taking Active   Black Cohosh 40 MG CAPS [id number] Yes Take 1 capsule (40 mg total) by mouth daily. Constant, Peggy, MD Taking Active   budesonide-formoterol [facility]) 160-4.5 MCG/ACT inhaler [id number] Yes Inhale 2 puffs into the lungs 2 (two) times daily. Tower, Wynelle Fanny, MD Taking Active   buPROPion (WELLBUTRIN XL) 150 MG 24 hr tablet [id number] Yes Take 150 mg by mouth daily. [provider] Taking Active Self  busPIRone (BUSPAR) 15 MG tablet [id number] Yes Take 15 mg by mouth 2 (two) times daily before a meal. [provider] Taking Active Self  cholecalciferol (VITAMIN D) 25 MCG (1000 UNIT) tablet [id number] Yes Take 2,000 Units by mouth daily. 2 tablets daily [provider] Taking Active Self  clobetasol cream (TEMOVATE) 0.05 % [id number] Yes APPLY SMALL AMOUNT TO AFFECTED AREAS TWO TO THREE TIMES PER WEEK Anyanwu, Sallyanne Havers, MD Taking Active   cyanocobalamin 2000 MCG tablet [id number] Yes Take 1,000 mcg by mouth daily. [provider] Taking Active Self  diclofenac Sodium (VOLTAREN) 1 % GEL [id number] Yes Apply 2 g topically 2 (two) times daily as needed (pain). Nolberto Hanlon, MD Taking Active   diphenhydrAMINE (BENADRYL) 25 MG tablet [id number] Yes Take 25 mg by mouth every 6 (six) hours as needed for allergies. [provider] Taking Active  Self  divalproex (DEPAKOTE ER) 500 MG 24 hr tablet [id number] Yes Take 500 mg by mouth at bedtime.  [provider] Taking Active Self  etanercept (ENBREL SURECLICK) 50 MG/ML injection [id number] Yes Inject 50 mg into the skin once a week. Fuller Plan, MD Taking Active   fluticasone [facility]) 50 MCG/ACT nasal spray [id number]  Yes PLACE 2 SPRAYS INTO THE NOSE DAILY. Waymon Budge, MD Taking Active   HYDROcodone-acetaminophen (NORCO/VICODIN) 5-325 MG tablet [id number] Yes Take 1 tablet by mouth 3 (three) times daily as needed for moderate pain. [name], MD Taking Active   ipratropium-albuterol (DUONEB) 0.5-2.5 (3) MG/3ML SOLN [id number] Yes Take 3 mLs by nebulization every 6 (six) hours as needed. Waymon Budge, MD Taking Active            Med Note Cyndie Chime, [facility]   Sun Aug 28, 2020 10:00 PM)    ketotifen (ZADITOR) 0.025 % ophthalmic solution [id number] Yes Place 1 drop into both eyes 2 (two) times daily as needed (allergies). [provider] Taking Active Self  levothyroxine (SYNTHROID) 50 MCG tablet [id number] Yes Take 1 tablet (50 mcg total) by mouth daily before breakfast. Tower, Audrie Gallus, MD Taking Active   LORazepam (ATIVAN) 1 MG tablet [id number] Yes Take 1 mg by mouth at bedtime as needed for anxiety or sleep. [provider] Taking Active Self  metoprolol succinate (TOPROL-XL) 25 MG 24 hr tablet [id number] Yes TAKE 1/2 TABLET BY MOUTH EVERY MORNING AND EVERY NIGHT AT BEDTIME Tower, Audrie Gallus, MD Taking Active   mirabegron ER (MYRBETRIQ) 50 MG TB24 tablet [id number] Yes Take 1 tablet (50 mg total) by mouth daily. Tower, Audrie Gallus, MD Taking Active   mirtazapine (REMERON) 30 MG tablet [id number] Yes Take 30 mg by mouth at bedtime.  [provider] Taking Active Self           Med Note Para March, Dwana Curd   Fri Apr 04, 2018  8:11 AM)    ondansetron (ZOFRAN-ODT) 4 MG disintegrating tablet [id number] Yes Take 1 tablet (4 mg total) by mouth every 8 (eight) hours as needed for nausea or vomiting. Tower, Audrie Gallus, MD Taking Active   predniSONE (DELTASONE) 10 MG tablet [id number] Yes TAKE ONE TABLET BY MOUTH DAILY AS DIRECTED Rice, Jamesetta Orleans, MD Taking Active   Rhubarb (ESTROVEN COMPLETE) 4 MG TABS [id number] Yes Take 4 mg by mouth daily. Constant, Peggy, MD Taking Active   sertraline  (ZOLOFT) 100 MG tablet [id number] Yes Take 100 mg by mouth daily.  [provider] Taking Active Self           Med Note Para March, Dwana Curd   Fri Apr 04, 2018  8:12 AM)    simvastatin (ZOCOR) 20 MG tablet [id number] Yes TAKE ONE TABLET BY MOUTH EVERY NIGHT AT BEDTIME Tower, Audrie Gallus, MD Taking Active   Spacer/Aero-Holding Chambers (AEROCHAMBER PLUS) inhaler [id number] Yes Use with inhaler Domenick Gong, MD Taking Active   sulfaSALAzine (AZULFIDINE) 500 MG tablet [id number] Yes Take 2 tablets (1,000 mg total) by mouth 2 (two) times daily. Fuller Plan, MD Taking Active   traMADol Janean Sark) 50 MG tablet [id number] Yes Take 50 mg by mouth every 6 (six) hours as needed. 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  Bilateral leg weakness 10/09/2021   Vitamin B12 deficiency 08/02/2021   Myofascial pain dysfunction syndrome 08/02/2021   Adverse effect of prednisone 07/11/2021   Prediabetes 07/11/2021   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   History of pulmonary embolism 12/08/2020   Arthritis of knee 12/06/2020   Bilateral knee pain 09/20/2020   Seronegative rheumatoid arthritis ([place]) 09/20/2020   High risk medication use 09/20/2020   Extensor tenosynovitis of right wrist 09/15/2020   S/P reverse total shoulder arthroplasty, 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   Lung nodule 01/02/2020   Status post arthroscopy of left shoulder 01/16/2018   Estrogen deficiency 06/28/2017   Medial epicondylitis, left elbow 04/29/2017   S/P arthroscopy of left shoulder 02/25/2017   Impingement syndrome of left shoulder 01/30/2017   Pedal edema 10/26/2016   Trochanteric bursitis, right hip 04/10/2016   Essential hypertension 09/02/2015   Urge incontinence 07/27/2015   Obesity 04/28/2015   Screening for HIV (human immunodeficiency virus)  04/26/2015   Osteoarthritis of left knee 01/28/2015   Status post total left knee replacement 01/28/2015   Vitamin D deficiency 04/27/2014   Seasonal and perennial allergic rhinitis 06/17/2013   S/P total hip arthroplasty 03/20/2013   Medicare annual wellness visit, subsequent 11/18/2012   History of falling 11/18/2012   Routine general medical examination at a health care facility 02/54/2706   Lichen sclerosus et atrophicus of the vulva 07/29/2012   Osteopenia 10/19/2011   Hypothyroidism 10/19/2011   ANEMIA 10/11/2009   Hereditary and idiopathic peripheral neuropathy 08/05/2009   Low back pain 08/05/2009   HAND PAIN, BILATERAL 08/05/2009   TREMOR 03/09/2008   MENOPAUSAL SYNDROME 10/09/2007   DEPRESSION, MAJOR 05/20/2007   BIPOLAR AFFECTIVE DISORDER 05/20/2007   Generalized anxiety disorder 05/20/2007   PERSONALITY DISORDER 05/20/2007   ESOPHAGEAL SPASM 05/20/2007   Diaphragmatic hernia 05/20/2007   AMAUROSIS FUGAX 05/07/2007   COPD mixed type ([place]) 03/27/2007   HYPERCHOLESTEROLEMIA, PURE 12/10/2006   SYMPTOM, SYNDROME, CHRONIC FATIGUE 12/10/2006    Immunization History  Administered Date(s) Administered   Influenza Split 11/19/2011, 11/02/2013   Influenza Whole 12/20/2006, 11/25/2008, 11/26/2009, 11/24/2010   Influenza, High Dose Seasonal PF 12/13/2016   Influenza,inj,Quad PF,6+ Mos 12/06/2015, 11/28/2021   Influenza-Unspecified 12/08/2012, 12/03/2014, 12/08/2017, 11/10/2018, 10/31/2019   Moderna Sars-Covid-2 Vaccination 06/21/2019, 07/19/2019, 01/22/2020   PFIZER(Purple Top)SARS-COV-2 Vaccination 06/04/2020   Pfizer Covid-19 Vaccine Bivalent Booster 68yrs & up 12/29/2021   Pneumococcal Conjugate-13 12/06/2015   Pneumococcal Polysaccharide-23 01/26/2002, 03/23/2008, 09/07/2010, 06/28/2017   Respiratory Syncytial Virus Vaccine,Recomb Aduvanted(Arexvy) 12/29/2021   Td 06/26/2000, 10/19/2011, 02/14/2022   Zoster Recombinat (Shingrix) 09/16/2017      Compliance/Adherence/Medication fill history: Care Gaps: Lung cancer screening (12/14/20)  Star-Rating Drugs: Simvastatin - PDC 100%  SDOH:  (Social Determinants of Health) assessments and interventions performed: Yes SDOH Interventions    Flowsheet Row Care Coordination from 03/02/2022 in [place] from 07/10/2021 in [place] at [place] [facility] from 05/15/2021 in [place] at [place] from 06/30/2018 in [place] at [place] from 05/09/2016 in [place] at [place] Interventions -- Intervention Not Indicated -- -- --  Housing Interventions -- Intervention Not Indicated Intervention Not Indicated -- --  Transportation Interventions -- Intervention Not Indicated -- -- --  Depression Interventions/Treatment  -- Medication, Currently on Treatment -- PHQ2-9 Score <4 Follow-up Not Indicated Currently on Treatment, Counseling, Medication  [per pt, she is  under care of both psychiatrist and psychologist]  Financial Strain Interventions Other (Comment)  [applied to LIS/extra help. Also discussed refilling generics and holding off on refilling Mybetriq] Intervention Not Indicated Other (Comment)  [Symbicort - enrolled AZ&Me,  provided info for LIS] -- --  Physical Activity Interventions -- Patient Refused -- -- --  Stress Interventions -- Intervention Not Indicated -- -- --  Social Connections Interventions -- Intervention Not Indicated -- -- --      SDOH Screenings   Food Insecurity: No Food Insecurity (07/10/2021)  Housing: Low Risk  (07/10/2021)  Transportation Needs: No Transportation Needs (07/10/2021)  Alcohol Screen: Low Risk  (07/10/2021)  Depression (PHQ2-9): Low Risk  (08/02/2021)  Recent Concern: Depression (PHQ2-9) - Medium Risk (07/11/2021)  Financial Resource  Strain: High Risk (03/02/2022)  Physical Activity: Inactive (07/10/2021)  Social Connections: Moderately Isolated (07/10/2021)  Stress: No Stress Concern Present (07/10/2021)  Tobacco Use: Medium Risk (03/15/2022)    Medication Assistance:  Symbicort - AZ&Me approved 2023. Patient given website and phone number to apply for LIS (Extra Help)  Medication Access: Within the past 30 days, how often has patient missed a dose of medication? 0 Is a pillbox or other method used to improve adherence? Yes  Factors that may affect medication adherence? financial need Are meds synced by current pharmacy? No  Are meds delivered by current pharmacy? No  Does patient experience delays in picking up medications due to transportation concerns? No   [facility] Services Reviewed: Is patient disadvantaged to use [facility]?: Yes  Current Rx insurance plan: [facility] Name and location of Current pharmacy:  [id number] - [place], [place] [address] Phone: [phone] Fax: [phone]  [facility] services reviewed with patient today?: No  Patient requests to transfer care to [facility] Pharmacy?: No  Reason patient declined to change pharmacies: Disadvantaged due to insurance/mail order   Assessment/Plan  Hypertension / CKD Stage 3a (BP goal <130/80) -Controlled - BP at goal in recent clinic visits -Current home BP readings: SBP 120s-140s -Denies hypotensive/hypertensive symptoms -Current treatment: Metoprolol succinate 25 mg - 1/2 tab BID - Appropriate, Effective, Safe, Accessible -Medications previously tried: amlodipine, furosemide  -Current dietary habits: not discussed -Current exercise habits: limited -Educated on BP goals and benefits of medications for prevention of heart attack, stroke and kidney damage; Importance of home blood pressure monitoring; Symptoms of hypotension and importance of maintaining adequate  hydration; -Counseled to monitor BP at home daily -Recommended to continue current medication  Hyperlipidemia: (LDL goal < 100) -Not ideally controlled - LDL 123 (06/2021), which is the highest it has been in over a decade; pt endorses compliance with statin -Current treatment: Simvastatin 20 mg daily HS - Appropriate, Effective, Safe, Accessible -Medications previously tried: n/a  -Educated on Cholesterol goals; Benefits of statin for ASCVD risk reduction; -Consider optimizing statin therapy at follow up  COPD (Goal: control symptoms and prevent exacerbations) -Controlled - pt reports improved breathing while on Symbicort, now accessible through AZ&Me, however after 02/25/22 Symbicort will be removed from AZ&Me and pt cannot afford the copay; discussed Judithann Sauger is still available through PAP and she may benefit from upgrading to triple therapy clinically; she has 2 Symbicort inhalers remaining and can switch when those run out  -Gold Grade: unable to assess -Current COPD Classification:  A (low sx, <2 exacerbations/yr) -Pulmonary function testing: 10/27/10- FEV1 1.54, FEV1/FVC 0.63, FEF 25-75% 0.84 -Exacerbations requiring treatment in last 6 months: 1 -Current  treatment  Albuterol HFA -Appropriate, Effective, Safe, Accessible Symbicort 160-4.5 mcg/act 2 puff BID (PAP) -Appropriate, Effective, Safe, Accessible Duoneb PRN -Appropriate, Effective, Safe, Accessible -Medications previously tried: n/a  -Patient denies consistent use of maintenance inhaler -Frequency of rescue inhaler use: 1-2x daily -Counseled on Proper inhaler technique; Benefits of consistent maintenance inhaler use -Recommend to change Symbicort to [employer]. Enrolled in AZ&Me for [facility] 2024.  Bipolar disorder / Depression / Anxiety (Goal: manage symptoms) -Controlled - per patient report; she reports every time they try to change her meds "something goes wrong" and they change it back -PHQ9: 0 (06/2020) - minimal  depression -GAD7: not on file -Connected with Dr Phillip Heal for mental health support -Current treatment: Bupropion XL 150 mg daily -Appropriate, Effective, Safe, Accessible Buspirone 15 mg BID -Appropriate, Effective, Safe, Accessible Mirtazapine 30 mg HS -Appropriate, Effective, Safe, Accessible Sertraline 100 mg daily -Appropriate, Effective, Safe, Accessible Divalproex ER 500 mg daily HS -Appropriate, Effective, Safe, Accessible Lorazepam 1 mg HS prn -Appropriate, Effective, Safe, Accessible -Medications previously tried/failed: n/a -Educated on Benefits of medication for symptom control -Recommended to continue current medication  Osteoporosis screening (Goal prevent fractures) -Controlled - due for DEXA. On chronic prednisone and high fall risk -Last DEXA Scan: 06/2017   T-Score femoral neck: -0.4  T-Score lumbar spine: +0.4  10-year probability of major osteoporotic fracture: n/a  10-year probability of hip fracture: n/a -Patient is not a candidate for pharmacologic treatment -Current treatment  Vitamin D 1000 IU - 2 daily - Appropriate, Effective, Safe, Accessible -Medications previously tried: n/a  -Recommend weight-bearing and muscle strengthening exercises for building and maintaining bone density. -Recommend Repeat DEXA when able  Hypothyroidism (Goal: maintain TSH in goal range) -Controlled - TSH 3.86 (06/2021) at goal -Current treatment  Levothyroxine 50 mcg HS - Appropriate, Effective, Safe, Accessible -Medications previously tried: n/a  -Recommended to continue current medication  Anemia (Goal: maintain HGB) -Controlled- HGB 12.2 (06/2021) at goal; she is now off of iron after iron/HGB levels normalized -Current treatment  Vitamin B12 2000 mcg daily - Appropriate, Query Effective -Medications previously tried: Niferex -Recommend to continue current medication  OAB (Goal: manage symptoms) -Controlled - pt reports higher dose of Myrbetriq is working reasonably  well, she would like to take 25 mg BID however because she feels it wears off in the daytime,  -Current treatment  Myrbetriq ER 50 mg daily HS - Appropriate, Effective, Safe, Not Accessible -Medications previously tried: oxybutynin  -Recommend to continue current medication; attempt PA for Myrbetriq 25 mg BID  Rheumatoid Arthritis (Goal: manage pain) -Controlled- pt reports Enbrel is a "miracle drug" for her, pain is much improved and she is in process of tapering off prednisone -Pt reports leflunomide side effects: diarrhea, muscle pain, headaches, chills -Follows with Dr Dimple Casey [facility]) -Current treatment  Voltaren gel - Appropriate, Effective, Safe, Accessible Hydrocodone-APAP 5-325 mg TID PRN -Appropriate, Effective, Safe, Accessible Enbrel 50 mg weekly -Appropriate, Effective, Safe, Accessible Prednisone 10 mg daily - Appropriate, Effective, Safe, Accessible -Medications previously tried: plaquenil, methotrexate; neuropathy- baclofen, gabepntin, pregabalin -Recommended to continue current medication  Chronic pain (Goal: manage pain) -Not ideally controlled - pt endorses medication do not help much with pain, she has nerve stimulator procedure scheduled for Jan 2024 -Degenerative disc disease, lumbar radiculopathy, RA; query neuropathy - pt has not tolerated any neuralgia drugs -Established with Dr Richardson Dopp, [facility] -Current treatment  Prednisone 10 mg daily - Appropriate, Effective, Query Safe Tramadol 50 mg q6h PRN -Appropriate, Query Effective Voltaren gel -  Appropriate, Effective, Safe, Accessible -Medications previously tried: tegretol, gabapentin, pregabalin, duloxetine, trileptal, butrans, belbuca, tramadol  -Recommended to continue current medication  Postmenopausal symptoms (Goal: manage symptoms) -Not ideally controlled - pt stopped Premarin after last visit due to VTE risk; she has started supplements and reports they are not helping much with hot flashes  but understands that it can take several months for effect -Current treatment  Black Cohosh - Appropriate, Query Effective, Safe, Accessible Rhubarb/Estroven -Appropriate, Query Effective, Safe, Accessible -Medications previously tried: Premarin -Discussed risk of VTE "clot" with hormone replacement; 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; furthermore pt has not been able to tolerate anticoagulation so would consider her risk even higher for developing a clot  -Recommend to continue current medication  Health Maintenance -Vaccine gaps: Shingrix #2 -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

## 2022-04-05 NOTE — Telephone Encounter (Signed)
Yes, do you need me to send it?

## 2022-04-27 ENCOUNTER — Telehealth: Payer: Self-pay | Admitting: Family Medicine

## 2022-04-27 NOTE — Telephone Encounter (Signed)
Does she mean generic myrbetriq ?   She had 50 mg on her list- will she be taking 25 mg instead or 2 of the 25 mg tabs once daily ? Thanks

## 2022-04-27 NOTE — Telephone Encounter (Signed)
Yes pt wants to take myrbetriq 25 mg BID she feels that taking it once daily it wears off and she can't cut the 50 mg in half so she wants to take the '25mg'$  BID, [place]

## 2022-04-27 NOTE — Telephone Encounter (Signed)
Pt called to let [facility] know she was approved for the meds, vectric 25 mg, by her insurance & needs prescription sent to [facility]. Call back # [phone]

## 2022-04-29 MED ORDER — MIRABEGRON ER 25 MG PO TB24: 25.0000 mg | ORAL_TABLET | Freq: Two times a day (BID) | ORAL | 3 refills | Status: DC

## 2022-04-29 NOTE — Addendum Note (Signed)
Addended by: Loura Pardon A on: 04/29/2022 02:57 PM   Modules accepted: Orders

## 2022-04-29 NOTE — Telephone Encounter (Signed)
I sent it  Insurance may not pay for bid dosing- we will see

## 2022-04-30 NOTE — Telephone Encounter (Signed)
Pt called stating her insurance approved her for '25mg'$  twice daily, not '50mg'$  once daily. Call back # [phone]

## 2022-04-30 NOTE — Telephone Encounter (Signed)
That's the dose PCP sent in

## 2022-05-03 ENCOUNTER — Encounter: Payer: Self-pay | Admitting: Radiology

## 2022-05-16 ENCOUNTER — Telehealth: Payer: Self-pay

## 2022-05-16 NOTE — Telephone Encounter (Signed)
Patient contacted the office stating she would like her prednisone mg increased. Patient states she feels miserable and cannot stand for longer than 10-15 seconds. Patient states she is having swelling and pain in her wrists, elbows, fingers, and lower back. Patient's pharmacy is now the [facility] on [address]. Patient's call back number is [phone]. Please advise.

## 2022-05-16 NOTE — Telephone Encounter (Signed)
She is currently prescribed prednisone 10 mg daily. She can try increasing this to taking 20 mg daily for the next few days and see if this improves symptoms significantly. I would not recommend going up to higher than 20 mg daily for RA. If this does not help then we have an appointment in 2 weeks and can take a look at other options.

## 2022-05-16 NOTE — Telephone Encounter (Signed)
Patient advised She is currently prescribed prednisone 10 mg daily. She can try increasing this to taking 20 mg daily for the next few days and see if this improves symptoms significantly. Dr. Benjamine Mola would not recommend going up to higher than 20 mg daily for RA. If this does not help then Her and Dr.Rice have an appointment in 2 weeks and can take a look at other options.

## 2022-05-18 ENCOUNTER — Telehealth (INDEPENDENT_AMBULATORY_CARE_PROVIDER_SITE_OTHER): Payer: Medicare Other | Admitting: Family Medicine

## 2022-05-18 ENCOUNTER — Encounter: Payer: Self-pay | Admitting: Family Medicine

## 2022-05-18 VITALS — BP 157/76 | Temp 100.0°F

## 2022-05-18 DIAGNOSIS — J069 Acute upper respiratory infection, unspecified: Secondary | ICD-10-CM

## 2022-05-18 MED ORDER — BENZONATATE 200 MG PO CAPS: 200.0000 mg | ORAL_CAPSULE | Freq: Three times a day (TID) | ORAL | 1 refills | Status: DC | PRN

## 2022-05-18 NOTE — Patient Instructions (Signed)
It hink you have a viral upper respiratory infection  May be the flu in light of the fever  Glad the covid test was negative   Drink fluids and rest  Mucinex (plain) may help congestion  Nasal saline for congestion as needed  Tylenol for fever or pain or headache  Try tessalon for cough  The tramadol you have may help cough also  The 20 mg of prednisone you take will help wheezing but if you wheeze more please let us know Continue the inhalers Watch your oxygen level (pulse ox)- let us know if it goes below 91 when resting  Use albuterol for rescue if needed  Please alert [place] if symptoms worsen (if severe or short of breath please go to the ER)    Update if not starting to improve in a week or if worsening

## 2022-05-18 NOTE — Assessment & Plan Note (Signed)
Day 3 Neg covid test at home Thankfully copd is not too bad- continues prednisone 20 mg daily and her inhalers (has not needed albuterol) Disc symptom care/ see AVS Disc ER precautions in detail   Has tramadol on hand for cough Sent tessalon also  Plain mucinex may help congestion  Tylenol for fever  Update if not starting to improve in a week or if worsening  Low threshold to come in for exam Declines flu test

## 2022-05-18 NOTE — Progress Notes (Signed)
Virtual Visit via Video Note  I connected with Lori Orozco on 05/18/22 at 12:00 PM EDT by a video enabled telemedicine application and verified that I am speaking with the correct person using two identifiers.  Location: Patient: home Provider: office    I discussed the limitations of evaluation and management by telemedicine and the availability of in person appointments. The patient expressed understanding and agreed to proceed.  Parties involved in encounter  Patient: Lori Orozco  Provider:  Loura Pardon MD   History of Present Illness: Pt presents with cough/fever  Has h/o  COPD   Caught from her roommate who is getting better    Vitals:   05/18/22 1154  BP: (!) 157/76  Temp: 100 F (37.8 C)   Started symptoms about 3 days ago  Getting a little worse each day  Took covid test and it was negative   Congested cough  Clear mucous so far  A little wheeze More sob with exertion   Body aches and chills with fever 100 temp past few days   Throat burns  Does not feel good    Otc Tylenol   Uses breztri  Already on prednisone 20  Has not really needed the albuterol   Has a new stimulator in back   Takes norco sparingly  Has some tramadol   Patient Active Problem List   Diagnosis Date Noted   Lumbar herniated disc    Chronic pain 11/28/2021   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   Bilateral leg weakness 10/09/2021   Vitamin B12 deficiency 08/02/2021   Myofascial pain dysfunction syndrome 08/02/2021   Adverse effect of prednisone 07/11/2021   Prediabetes 07/11/2021   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   History of pulmonary embolism 12/08/2020   Arthritis of knee 12/06/2020   Bilateral knee pain 09/20/2020   Seronegative rheumatoid arthritis ([place]) 09/20/2020   High risk medication use 09/20/2020   Extensor tenosynovitis of right wrist 09/15/2020   S/P reverse total shoulder arthroplasty, 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   Lung  nodule 01/02/2020   Viral URI with cough 03/13/2019   Status post arthroscopy of left shoulder 01/16/2018   Estrogen deficiency 06/28/2017   Medial epicondylitis, left elbow 04/29/2017   S/P arthroscopy of left shoulder 02/25/2017   Impingement syndrome of left shoulder 01/30/2017   Pedal edema 10/26/2016   Trochanteric bursitis, right hip 04/10/2016   Essential hypertension 09/02/2015   Urge incontinence 07/27/2015   Obesity 04/28/2015   Screening for HIV (human immunodeficiency virus) 04/26/2015   Osteoarthritis of left knee 01/28/2015   Status post total left knee replacement 01/28/2015   Vitamin D deficiency 04/27/2014   Seasonal and perennial allergic rhinitis 06/17/2013   S/P total hip arthroplasty 03/20/2013   Medicare annual wellness visit, subsequent 11/18/2012   History of falling 11/18/2012   Routine general medical examination at a health care facility [id number]   Lichen sclerosus et atrophicus of the vulva 07/29/2012   Osteopenia 10/19/2011   Hypothyroidism 10/19/2011   ANEMIA 10/11/2009   Hereditary and idiopathic peripheral neuropathy 08/05/2009   Low back pain 08/05/2009   HAND PAIN, BILATERAL 08/05/2009   TREMOR 03/09/2008   MENOPAUSAL SYNDROME 10/09/2007   DEPRESSION, MAJOR 05/20/2007   BIPOLAR AFFECTIVE DISORDER 05/20/2007   Generalized anxiety disorder 05/20/2007   PERSONALITY DISORDER 05/20/2007   ESOPHAGEAL SPASM 05/20/2007   Diaphragmatic hernia 05/20/2007   AMAUROSIS FUGAX 05/07/2007   COPD mixed type ([place]) 03/27/2007   HYPERCHOLESTEROLEMIA, PURE 12/10/2006   SYMPTOM, SYNDROME, CHRONIC FATIGUE 12/10/2006  Past Medical History:  Diagnosis Date   Amaurosis fugax    Anemia    hx   Anxiety    Arthritis    Asthma    Cataract    Chronic fatigue syndrome    Chronic kidney disease    frequency, nephritis when 72 yrs old   COPD (chronic obstructive pulmonary disease) ([place])    COVID-19    Depression    Diverticulitis    Emphysema of lung ([facility])     Fibromyalgia    GERD (gastroesophageal reflux disease)    occ   H/O hiatal hernia    History of bronchitis    Hyperlipidemia    Hypothyroidism    Interstitial cystitis    Irritable bowel syndrome    Lichen sclerosus    Lumbar herniated disc    Migraines    Motor nerve conduction block 11/2021   Pneumonia    PONV (postoperative nausea and vomiting)    Shortness of breath    exertion    Thyroid disease    [place]   Urinary frequency    Urinary tract infection    Vertigo    Past Surgical History:  Procedure Laterality Date   ABDOMINAL HYSTERECTOMY     bladder sugery      CAROTID DOPPLAR     COLONOSCOPY  05/26/2010   avms- otherwise nl , re check 10y   DEXA-OSTEOPENIA     DOPPLER ECHOCARDIOGRAPHY     elbow surgery     EPICONDYLITIS     EYE SURGERY Bilateral    cataracts   FOOT SURGERY Bilateral    I & D EXTREMITY Right 09/01/2020   Procedure: TYNOSYNOVECTOMY;  Surgeon: Mcarthur Rossetti, MD;  Location: [place];  Service: Orthopedics;  Laterality: Right;   KNEE SURGERY     Left cartilage   lipoma in second finger right hand     PLANTAR FASCIA SURGERY Left    REVERSE SHOULDER ARTHROPLASTY Left 02/02/2020   Procedure: LEFT REVERSE SHOULDER ARTHROPLASTY;  Surgeon: Meredith Pel, MD;  Location: [facility];  Service: Orthopedics;  Laterality: Left;   ROTATOR CUFF REPAIR Left 12/2017   TEMPOROMANDIBULAR JOINT SURGERY     TONSILLECTOMY     TOTAL HIP ARTHROPLASTY Right 03/20/2013   Procedure: Right TOTAL HIP ARTHROPLASTY;  Surgeon: Newt Minion, MD;  Location: [facility];  Service: Orthopedics;  Laterality: Right;  Right Total Hip Arthroplasty   TOTAL KNEE ARTHROPLASTY Left 01/28/2015   Procedure: LEFT TOTAL KNEE ARTHROPLASTY;  Surgeon: Mcarthur Rossetti, MD;  Location: [facility];  Service: Orthopedics;  Laterality: Left;   TRIGGER FINGER RELEASE Right    TROCHANTERIC BURSA EXCISION Right 05/03/2016   Dr. Ninfa Linden, [place] [facility]   WRIST ARTHROSCOPY Right    ligament tear    Social History   Tobacco Use   Smoking status: Former    Packs/day: 2.00    Years: 40.00    Additional pack years: 0.00    Total pack years: 80.00    Types: E-cigarettes, Cigarettes    Quit date: 2015    Years since quitting: 9.2    Passive exposure: Never   Smokeless tobacco: Never  Vaping Use   Vaping Use: Some days   Devices: uses about once per month  Substance Use Topics   Alcohol use: No    Alcohol/week: 0.0 standard drinks of alcohol   Drug use: No   Family History  Problem Relation Age of Onset   Arthritis Mother    Hypertension Mother  Kidney disease Mother    Cancer Father        Bladder   Arthritis Sister    Colon cancer Other    Cancer - Lung Cousin    Arthritis Maternal Grandmother    Allergies  Allergen Reactions   Lithium Anaphylaxis   Tegretol [Carbamazepine] Other (See Comments)    Fever and body aches (fever over [id number])   Tricyclic Antidepressants Anaphylaxis    Other reaction(s): Unknown   Methotrexate Derivatives Other (See Comments)    Urinary retention and dizziness  Other reaction(s): Other UTI   Strawberry Extract Hives   Tapentadol Rash    PT ALLERGIC NYLON TAPE    Codeine Nausea Only    Makes pt stay awake   Cymbalta [Duloxetine Hcl] Other (See Comments)    Makes pt pass out    Erythromycin     abdominal pain   Lyrica [Pregabalin]     Felt faint   Neurontin [Gabapentin]     Passes  out   Rabeprazole Sodium     insomnia   Baclofen Nausea Only    Sever nausea   Duraprep [Antiseptic Products, Misc.] Itching and Rash    Tolerates Betadine    Penicillins Rash    Tolerated ANCEF 02/02/20  Has patient had a PCN reaction causing immediate rash, facial/tongue/throat swelling, SOB or lightheadedness with hypotension: Yes Has patient had a PCN reaction causing severe rash involving mucus membranes or skin necrosis: No Has patient had a PCN reaction that required hospitalization No Has patient had a PCN reaction occurring within  the last 10 years: No If all of the above answers are "NO", then may proceed with Cephalosporin use.    Tape Rash    PT ALLERGIC NYLON TAPE    Current Outpatient Medications on File Prior to Visit  Medication Sig Dispense Refill   albuterol (VENTOLIN HFA) 108 (90 Base) MCG/ACT inhaler Inhale 1-2 puffs into the lungs every 4 (four) hours as needed for wheezing or shortness of breath. 1 each 0   Black Cohosh 40 MG CAPS Take 1 capsule (40 mg total) by mouth daily. 30 capsule 11   Budeson-Glycopyrrol-Formoterol (BREZTRI AEROSPHERE) 160-9-4.8 MCG/ACT AERO Inhale 2 puffs into the lungs 2 (two) times daily. 3 each 3   buPROPion (WELLBUTRIN XL) 150 MG 24 hr tablet Take 150 mg by mouth daily.     busPIRone (BUSPAR) 15 MG tablet Take 15 mg by mouth 2 (two) times daily before a meal.     cholecalciferol (VITAMIN D) 25 MCG (1000 UNIT) tablet Take 2,000 Units by mouth daily. 2 tablets daily     clobetasol cream (TEMOVATE) 0.05 % APPLY SMALL AMOUNT TO AFFECTED AREAS TWO TO THREE TIMES PER WEEK 30 g 4   cyanocobalamin 2000 MCG tablet Take 1,000 mcg by mouth daily.     diclofenac Sodium (VOLTAREN) 1 % GEL Apply 2 g topically 2 (two) times daily as needed (pain).     diphenhydrAMINE (BENADRYL) 25 MG tablet Take 25 mg by mouth every 6 (six) hours as needed for allergies.     divalproex (DEPAKOTE ER) 500 MG 24 hr tablet Take 500 mg by mouth at bedtime.      etanercept (ENBREL SURECLICK) 50 MG/ML injection Inject 50 mg into the skin once a week. 12 mL 0   fluticasone (FLONASE) 50 MCG/ACT nasal spray PLACE 2 SPRAYS INTO THE NOSE DAILY. 16 g 11   HYDROcodone-acetaminophen (NORCO/VICODIN) 5-325 MG tablet Take 1 tablet by mouth 3 (three) times daily  as needed for moderate pain. 30 tablet 0   ipratropium-albuterol (DUONEB) 0.5-2.5 (3) MG/3ML SOLN Take 3 mLs by nebulization every 6 (six) hours as needed. 120 mL prn   ketotifen (ZADITOR) 0.025 % ophthalmic solution Place 1 drop into both eyes 2 (two) times daily as  needed (allergies).     levothyroxine (SYNTHROID) 50 MCG tablet Take 1 tablet (50 mcg total) by mouth daily before breakfast. 90 tablet 1   LORazepam (ATIVAN) 1 MG tablet Take 1 mg by mouth at bedtime as needed for anxiety or sleep.     metoprolol succinate (TOPROL-XL) 25 MG 24 hr tablet TAKE 1/2 TABLET BY MOUTH EVERY MORNING AND EVERY NIGHT AT BEDTIME 90 tablet 1   mirabegron ER (MYRBETRIQ) 25 MG TB24 tablet Take 1 tablet (25 mg total) by mouth in the morning and at bedtime. 180 tablet 3   mirtazapine (REMERON) 30 MG tablet Take 30 mg by mouth at bedtime.      ondansetron (ZOFRAN-ODT) 4 MG disintegrating tablet Take 1 tablet (4 mg total) by mouth every 8 (eight) hours as needed for nausea or vomiting. 20 tablet 1   predniSONE (DELTASONE) 10 MG tablet TAKE ONE TABLET BY MOUTH DAILY AS DIRECTED 30 tablet 2   Rhubarb (ESTROVEN COMPLETE) 4 MG TABS Take 4 mg by mouth daily. 30 tablet 11   sertraline (ZOLOFT) 100 MG tablet Take 100 mg by mouth daily.      simvastatin (ZOCOR) 20 MG tablet TAKE ONE TABLET BY MOUTH EVERY NIGHT AT BEDTIME 90 tablet 3   Spacer/Aero-Holding Chambers (AEROCHAMBER PLUS) inhaler Use with inhaler 1 each 2   sulfaSALAzine (AZULFIDINE) 500 MG tablet Take 2 tablets (1,000 mg total) by mouth 2 (two) times daily. 120 tablet 2   traMADol (ULTRAM) 50 MG tablet Take 50 mg by mouth every 6 (six) hours as needed.     No current facility-administered medications on file prior to visit.   Review of Systems  Constitutional:  Positive for fever and malaise/fatigue. Negative for chills.  HENT:  Positive for congestion and sore throat. Negative for ear pain and sinus pain.   Eyes:  Negative for blurred vision, discharge and redness.  Respiratory:  Positive for cough, sputum production, shortness of breath and wheezing. Negative for hemoptysis and stridor.   Cardiovascular:  Negative for chest pain, palpitations and leg swelling.  Gastrointestinal:  Negative for abdominal pain, diarrhea,  nausea and vomiting.  Musculoskeletal:  Negative for myalgias.  Skin:  Negative for rash.  Neurological:  Positive for headaches. Negative for dizziness.      Observations/Objective:  Patient appears well, in no distress Weight is baseline  No facial swelling or asymmetry Some hoarse voice  No obvious tremor or mobility impairment Moving neck and UEs normally Able to hear the call well  No wheeze or shortness of breath during interview  Cough sounds dry and hacking today Talkative and mentally sharp with no cognitive changes No skin changes on face or neck , no rash or pallor Affect is normal   Assessment and Plan: Problem List Items Addressed This Visit       Respiratory   Viral URI with cough - Primary    Day 3 Neg covid test at home Thankfully copd is not too bad- continues prednisone 20 mg daily and her inhalers (has not needed albuterol) Disc symptom care/ see AVS Disc ER precautions in detail   Has tramadol on hand for cough Sent tessalon also  Plain mucinex may help congestion  Tylenol for fever  Update if not starting to improve in a week or if worsening  Low threshold to come in for exam Declines flu test         Follow Up Instructions:   It hink you have a viral upper respiratory infection  May be the flu in light of the fever  Glad the covid test was negative   Drink fluids and rest  Mucinex (plain) may help congestion  Nasal saline for congestion as needed  Tylenol for fever or pain or headache  Try tessalon for cough  The tramadol you have may help cough also  The 20 mg of prednisone you take will help wheezing but if you wheeze more please let us know Continue the inhalers Watch your oxygen level (pulse ox)- let us know if it goes below 91 when resting  Use albuterol for rescue if needed  Please alert [place] if symptoms worsen (if severe or short of breath please go to the ER)    Update if not starting to improve in a week or if worsening  I  discussed the assessment and treatment plan with the patient. The patient was provided an opportunity to ask questions and all were answered. The patient agreed with the plan and demonstrated an understanding of the instructions.   The patient was advised to call back or seek an in-person evaluation if the symptoms worsen or if the condition fails to improve as anticipated.     Loura Pardon, MD

## 2022-05-26 ENCOUNTER — Other Ambulatory Visit: Payer: Self-pay | Admitting: Internal Medicine

## 2022-05-26 DIAGNOSIS — M0600 Rheumatoid arthritis without rheumatoid factor, unspecified site: Secondary | ICD-10-CM

## 2022-05-28 NOTE — Telephone Encounter (Signed)
Last Fill: 02/28/2022  Labs: 03/20/2022 Lab results look fine for continuing the Enbrel. Her inflammation numbers are now normal on the prednisone and Enbrel right now and can continue her current medication.   Next Visit: 05/30/2022  Last Visit: 02/28/2022  DX: Seronegative rheumatoid arthritis   Current Dose per office note 02/28/2022: sulfasalazine 1000 mg twice daily.   Okay to refill Sulfasalazine?

## 2022-05-30 ENCOUNTER — Encounter: Payer: Self-pay | Admitting: Internal Medicine

## 2022-05-30 ENCOUNTER — Ambulatory Visit: Payer: Medicare Other | Attending: Internal Medicine | Admitting: Internal Medicine

## 2022-05-30 VITALS — BP 129/75 | HR 73 | Resp 16 | Ht 67.0 in | Wt 218.0 lb

## 2022-05-30 DIAGNOSIS — Z79899 Other long term (current) drug therapy: Secondary | ICD-10-CM | POA: Insufficient documentation

## 2022-05-30 DIAGNOSIS — M0600 Rheumatoid arthritis without rheumatoid factor, unspecified site: Secondary | ICD-10-CM | POA: Insufficient documentation

## 2022-05-30 MED ORDER — PREDNISONE 10 MG PO TABS: ORAL_TABLET | ORAL | 1 refills | Status: AC

## 2022-05-30 NOTE — Patient Instructions (Signed)
I am not sure which of your current medicines are helping the most since symptoms are still acting up requiring increased prednisone dose. Try stopping the sulfasalazine and monitor symptoms for 2 weeks to see if there is much difference. After that I recommend decreasing prednisone dose by 5 mg at a time every 2 weeks until back at 10 mg or if symptoms flare up. I will send new Rx for this change.

## 2022-05-30 NOTE — Progress Notes (Signed)
Office Visit Note  Patient: Lori Orozco             Date of Birth: 1950-03-27           MRN: [id number]             PCP: [name], MD Referring: Tower, Audrie Gallus, MD Visit Date: 05/30/2022   Subjective:  Follow-up (Improving)   History of Present Illness: Lori Orozco is a 72 y.o. female here for follow up for seronegative RA on Enbrel 50 mg subcu weekly sulfasalazine 1000 mg twice daily and prednisone 20 mg daily.  She was taking the medications without interruptions but started experiencing increased joint pain swelling and stiffness especially affecting her hands wrists and elbows since about 2 weeks ago.  She called about this and we increased her to 20 mg prednisone daily up from 10 mg and she has noticed improvement in symptoms especially her elbows.  No longer swollen and is able to tolerate resting her arms on a flat surface without severe pain.  She did suffer from upper respiratory symptoms last week but these have mostly resolved without any new medication needed.   Previous HPI 02/28/22 Lori Orozco is a 72 y.o. female here for follow up for seronegative rheumatoid arthritis on Enbrel 50 mg subcu weekly and prednisone 10 mg daily.  She had to stop taking the sulfasalazine since about 2 weeks ago due to running out of the medication.  She had change with insurance status so having a dramatically increased cost for her medications at the moment that she is having to sort out.  She was tolerating the medicine okay.  She did not recall a noticeable difference in symptoms with starting the sulfasalazine but while off the medication has felt noticeably worse so thinks it was doing something.  Particularly the left wrist pain has been a lot better on treatment her right wrist continues to be bad before and after.  She saw Dr. Wynetta Emery for her neck recommendation was for physical therapy but she has not pursued this since and does not feel like she would tolerate this well  based on previous therapy experiences.  She does have plans for trying the nerve stimulator treatment starting later this month.      Previous HPI 12/21/21 Lori Orozco is a 72 y.o. 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  She continues to have pain and stiffness affecting her hands bilaterally although the severity of swelling has gotten notably better on the Enbrel treatment but is far from resolved.  Her biggest concern at the moment is about her neck pain concern of cervical radiculopathy.  We checked x-ray that showed considerable multilevel degenerative disease with no acute appearing bony abnormality.  She is followed up with her spine doctor with epidural block injection tried did not find it very beneficial at all.  She has discussed radiofrequency ablation as a neck step option for radicular pain.     Previous HPI 10/09/2021 Lori Orozco is a 72 y.o. female here for follow up for seronegative inflammatory arthritis on Enbrel 50 mg [place] weekly and prednisone 10 mg daily. She tried tapering prednisone but had severe worsening symptoms at 5 mg dose. She increased back to 10 mg daily and symptoms improved somewhat but still a lot of pain and swelling in both hands and wrists. She has ongoing back pain and stiffness and is seeing D.r Wynetta Emery  for this with known significant lumbar spine arthritis. More recently new problem with sensitivity and stinging type pain throughout both arms with even light pressure such as showering water. She also has worsening weakness in her proximal legs unable to stand without using arms, which are limited by pain.     Previous HPI 07/05/2021 Lori Orozco is a 72 y.o. female here for follow up for seronegative inflammatory arthritis on prednisone 20 mg daily dose and enbrel 50mg  subcutaneous once weekly. She has seen a large improvement in joint inflammation since starting the Enbrel. She has pain  and redness around the injection site lasting up to 3 days with each dose. Wrists continue to hurt and have decreased movement left wrist actually more symptomatic than right now. She is working with physical therapy on getting back to walking currently very weak and unstable due to illness and deconditioning. She had recent ED visit for COPD exacerbation finished antibiotics for this. No other recent infection issues.   Previous HPI 02/08/2021 Lori Orozco is a 72 y.o. 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  She has had major medical events since her last visit.  She developed increased knee pain and swelling on the right side.  However she went to the hospital due to developing more weakness and shortness of breath CT angiogram identified bilateral pulmonary emboli.  Treatment with anticoagulation led to significant gastrointestinal bleeding from previously unknown AVM.  This was treated also in the hospital but overall protracted complications lasted for about a month hospitalization.  During this time she experienced worsening joint pain and swelling and significant deconditioning and weakness.  She was discharged from the hospital to rehab facility she was not continued on any steroid or other anti-inflammatory treatment reports joint pain and swelling affecting numerous joints in bilateral elbows wrists fingers and knees.  This is limited any significant progress with physical therapy so far.  She called back to clinic earlier this month with the symptoms and was restarted on a prednisone taper currently down to 15 mg daily.  She saw orthopedics clinic for right knee aspiration injection 2 days ago that is partially helping.   Previous HPI 11/15/20 Lori Orozco is a 72 y.o. female here for follow up with wrist wrist extensor tenosynovitis and presumed seronegative inflammatory arthritis also involving knee joints after  starting methotrexate 15 mg PO weekly and tapering prednisone off. Her wrist is slightly improved but remains very painful and swollen with decreased mobility. After starting methotrexate she has noticed some episodes of dizziness and urinary retention and frequency. She stopped the prednisone without noticing much difference in symptoms so far.     09/20/20 Lori Orozco is a 72 y.o. female here for joint pain and swelling of knees and wrists with severe tenosynovitis s/p tenosynovectomy on 09/01/20 with Dr. [name].  She had been feeling in her usual health until June she recalls onset of symptoms after using a gas leaf blower at her home during which time she experienced some mild right wrist pain.  She does not recall any particular injury event at this time.  However she experienced progressive ongoing increase in pain with swelling and stiffness in her right wrist especially with decreased range of motion and pain over the dorsal aspect.  This is evaluated at orthopedics clinic with aspiration that revealed just coagulated sample trial of steroid medication and only partially improved symptoms then continued worsening.  She was admitted to the hospital for tenosynovectomy that was performed without complication.  Intraoperatively reported extensive inflammatory tissue debridement without any evidence concerning for infection.  During this hospitalization she also developed knee effusion which was aspirated demonstrating inflammatory synovial fluid with negative microscopy and cultures.  Is now almost 3 weeks since her surgery she continues experiencing severe pain intensity swelling around the right hand and wrist and has developed some skin blistering.     Labs reviewed 08/2020 Synovial fluid knee 4,525 WBCs 90% neutrophils ANA neg RF 14.2 CCP neg ESR 37 CRP 14.1 Uric acid 4.9   07/2020 Synovial fluid wrist clotted sample negative gram stain   Review of Systems  Constitutional:   Positive for fatigue.  HENT:  Positive for mouth dryness. Negative for mouth sores.   Eyes:  Positive for dryness.  Respiratory:  Positive for shortness of breath.   Cardiovascular:  Negative for chest pain and palpitations.  Gastrointestinal:  Negative for blood in stool, constipation and diarrhea.  Endocrine: Positive for increased urination.  Genitourinary:  Negative for involuntary urination.  Musculoskeletal:  Positive for joint pain, gait problem, joint pain, joint swelling, myalgias, muscle weakness, morning stiffness, muscle tenderness and myalgias.  Skin:  Positive for sensitivity to sunlight. Negative for color change, rash and hair loss.  Allergic/Immunologic: Positive for susceptible to infections.  Neurological:  Negative for dizziness and headaches.  Hematological:  Negative for swollen glands.  Psychiatric/Behavioral:  Positive for depressed mood and sleep disturbance. The patient is nervous/anxious.     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   Bilateral leg weakness 10/09/2021   Vitamin B12 deficiency 08/02/2021   Myofascial pain dysfunction syndrome 08/02/2021   Adverse effect of prednisone 07/11/2021   Prediabetes 07/11/2021   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   History of pulmonary embolism 12/08/2020   Arthritis of knee 12/06/2020   Bilateral knee pain 09/20/2020   Seronegative rheumatoid arthritis 09/20/2020   High risk medication use 09/20/2020   Extensor tenosynovitis of right wrist 09/15/2020   S/P reverse total shoulder arthroplasty, 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   Lung nodule 01/02/2020   Viral URI with cough 03/13/2019   Status post arthroscopy of left shoulder 01/16/2018   Estrogen deficiency 06/28/2017   Medial epicondylitis, left elbow 04/29/2017   S/P arthroscopy of left shoulder 02/25/2017   Impingement syndrome of  left shoulder 01/30/2017   Pedal edema 10/26/2016   Trochanteric bursitis, right hip 04/10/2016   Essential hypertension 09/02/2015   Urge incontinence 07/27/2015   Obesity 04/28/2015   Screening for HIV (human immunodeficiency virus) 04/26/2015   Osteoarthritis of left knee 01/28/2015   Status post total left knee replacement 01/28/2015   Vitamin D deficiency 04/27/2014   Seasonal and perennial allergic rhinitis 06/17/2013   S/P total hip arthroplasty 03/20/2013   Medicare annual wellness visit, subsequent 11/18/2012   History of falling 11/18/2012   Routine general medical examination at a health care facility [id number]   Lichen sclerosus et atrophicus of the vulva 07/29/2012   Osteopenia 10/19/2011   Hypothyroidism 10/19/2011   ANEMIA 10/11/2009   Hereditary and idiopathic peripheral neuropathy 08/05/2009   Low back pain 08/05/2009   HAND PAIN, BILATERAL 08/05/2009   TREMOR 03/09/2008   MENOPAUSAL SYNDROME 10/09/2007   DEPRESSION, MAJOR 05/20/2007   BIPOLAR AFFECTIVE DISORDER 05/20/2007   Generalized anxiety disorder 05/20/2007   PERSONALITY DISORDER 05/20/2007  ESOPHAGEAL SPASM 05/20/2007   Diaphragmatic hernia 05/20/2007   AMAUROSIS FUGAX 05/07/2007   COPD mixed type 03/27/2007   HYPERCHOLESTEROLEMIA, PURE 12/10/2006   SYMPTOM, SYNDROME, CHRONIC FATIGUE 12/10/2006    Past Medical History:  Diagnosis Date   Amaurosis fugax    Anemia    hx   Anxiety    Arthritis    Asthma    Cataract    Chronic fatigue syndrome    Chronic kidney disease    frequency, nephritis when 72 yrs old   COPD (chronic obstructive pulmonary disease)    COVID-19    Depression    Diverticulitis    Emphysema of lung    Fibromyalgia    GERD (gastroesophageal reflux disease)    occ   H/O hiatal hernia    History of bronchitis    Hyperlipidemia    Hypothyroidism    Interstitial cystitis    Irritable bowel syndrome    Lichen sclerosus    Lumbar herniated disc    Migraines    Motor  nerve conduction block 11/2021   Pneumonia    PONV (postoperative nausea and vomiting)    Shortness of breath    exertion    Thyroid disease    Graves   Urinary frequency    Urinary tract infection    Vertigo     Family History  Problem Relation Age of Onset   Arthritis Mother    Hypertension Mother    Kidney disease Mother    Cancer Father        Bladder   Arthritis Sister    Colon cancer Other    Cancer - Lung Cousin    Arthritis Maternal Grandmother    Past Surgical History:  Procedure Laterality Date   ABDOMINAL HYSTERECTOMY     bladder sugery      CAROTID DOPPLAR     COLONOSCOPY  05/26/2010   avms- otherwise nl , re check 10y   DEXA-OSTEOPENIA     DOPPLER ECHOCARDIOGRAPHY     elbow surgery     EPICONDYLITIS     EYE SURGERY Bilateral    cataracts   FOOT SURGERY Bilateral    I & D EXTREMITY Right 09/01/2020   Procedure: TYNOSYNOVECTOMY;  Surgeon: Mcarthur Rossetti, MD;  Location: [facility];  Service: Orthopedics;  Laterality: Right;   IMPLANTATION VAGAL NERVE STIMULATOR  03/2022   KNEE SURGERY     Left cartilage   lipoma in second finger right hand     PLANTAR FASCIA SURGERY Left    REVERSE SHOULDER ARTHROPLASTY Left 02/02/2020   Procedure: LEFT REVERSE SHOULDER ARTHROPLASTY;  Surgeon: Meredith Pel, MD;  Location: [facility];  Service: Orthopedics;  Laterality: Left;   ROTATOR CUFF REPAIR Left 12/2017   TEMPOROMANDIBULAR JOINT SURGERY     TONSILLECTOMY     TOTAL HIP ARTHROPLASTY Right 03/20/2013   Procedure: Right TOTAL HIP ARTHROPLASTY;  Surgeon: Newt Minion, MD;  Location: [facility];  Service: Orthopedics;  Laterality: Right;  Right Total Hip Arthroplasty   TOTAL KNEE ARTHROPLASTY Left 01/28/2015   Procedure: LEFT TOTAL KNEE ARTHROPLASTY;  Surgeon: Mcarthur Rossetti, MD;  Location: [facility];  Service: Orthopedics;  Laterality: Left;   TRIGGER FINGER RELEASE Right    TROCHANTERIC BURSA EXCISION Right 05/03/2016   Dr. Jean Rosenthal   WRIST  ARTHROSCOPY Right    ligament tear   Social History   Social History Narrative   Not on file   Immunization History  Administered Date(s) Administered  Influenza Split 11/19/2011, 11/02/2013   Influenza Whole 12/20/2006, 11/25/2008, 11/26/2009, 11/24/2010   Influenza, High Dose Seasonal PF 12/13/2016   Influenza,inj,Quad PF,6+ Mos 12/06/2015, 11/28/2021   Influenza-Unspecified 12/08/2012, 12/03/2014, 12/08/2017, 11/10/2018, 10/31/2019   Moderna Sars-Covid-2 Vaccination 06/21/2019, 07/19/2019, 01/22/2020   PFIZER(Purple Top)SARS-COV-2 Vaccination 06/04/2020   Pfizer Covid-19 Vaccine Bivalent Booster 51yrs & up 12/29/2021   Pneumococcal Conjugate-13 12/06/2015   Pneumococcal Polysaccharide-23 01/26/2002, 03/23/2008, 09/07/2010, 06/28/2017   Respiratory Syncytial Virus Vaccine,Recomb Aduvanted(Arexvy) 12/29/2021   Td 06/26/2000, 10/19/2011, 02/14/2022   Zoster Recombinat (Shingrix) 09/16/2017     Objective: Vital Signs: BP 129/75 (BP Location: Left Arm, Patient Position: Sitting, Cuff Size: Normal)   Pulse 73   Resp 16   Ht 5\' 7"  (1.702 m)   Wt 218 lb (98.9 kg)   BMI 34.14 kg/m    Physical Exam Constitutional:      Appearance: She is obese.  Cardiovascular:     Rate and Rhythm: Normal rate and regular rhythm.  Pulmonary:     Effort: Pulmonary effort is normal.     Breath sounds: Normal breath sounds.  Skin:    General: Skin is warm and dry.     Findings: Bruising present.  Neurological:     Mental Status: She is alert.      Musculoskeletal Exam:  Shoulders full ROM no tenderness or swelling Elbows full ROM no tenderness or swelling Wrist tenderness right wrist more decreased range of motion, chronic soft tissue swelling not sure if active intra-articular swelling MCP joint subluxation not entirely reducible and limited PIP flexion range of motion worse on right hand, second third MCP tenderness to pressure Knees full ROM tenderness to pressure along joint line and  with range of motion no palpable effusions Ankles full ROM no tenderness or swelling  CDAI Exam: CDAI Score: 13  Patient Global: 40 mm; Provider Global: 20 mm Swollen: 1 ; Tender: 6  Joint Exam 05/30/2022      Right  Left  Wrist  Swollen Tender   Tender  MCP 2   Tender     MCP 3   Tender     Knee   Tender   Tender     Investigation: No additional findings.  Imaging: No results found.  Recent Labs: Lab Results  Component Value Date   WBC 8.4 03/20/2022   HGB 11.7 03/20/2022   PLT 263 03/20/2022   NA 142 03/20/2022   K 4.4 03/20/2022   CL 100 03/20/2022   CO2 24 03/20/2022   GLUCOSE 79 03/20/2022   BUN 28 (H) 03/20/2022   CREATININE 1.09 (H) 03/20/2022   BILITOT 0.4 03/20/2022   ALKPHOS 61 03/20/2022   AST 16 03/20/2022   ALT 8 03/20/2022   PROT 6.5 03/20/2022   ALBUMIN 4.1 03/20/2022   CALCIUM 9.7 03/20/2022   GFRAA >60 01/29/2015   QFTBGOLDPLUS NEGATIVE 10/09/2021    Speciality Comments: No specialty comments available.  Procedures:  No procedures performed Allergies: Lithium; Tegretol [carbamazepine]; Tricyclic antidepressants; Methotrexate derivatives; Strawberry extract; Tapentadol; Codeine; Cymbalta [duloxetine hcl]; Erythromycin; Lyrica [pregabalin]; Neurontin [gabapentin]; Rabeprazole sodium; Baclofen; Duraprep [antiseptic products, misc.]; Penicillins; and Tape   Assessment / Plan:     Visit Diagnoses: Seronegative rheumatoid arthritis - Plan: Sedimentation rate, C-reactive protein, predniSONE (DELTASONE) 10 MG tablet  Having significant laboratory disease activity with increased painful and swollen joints.  Overall not clear that the addition of sulfasalazine has had any significant benefit on top of the existing Enbrel and prednisone treatment effect.  Will recheck  sed rate and CRP for disease activity assessment.  Recommend stopping the sulfasalazine continue on Enbrel 50 mg subcu weekly.  Send new prescription for additional prednisone taper from 20  mg then back to 10 mg daily baseline.   High risk medication use - Plan: CBC with Differential/Platelet, COMPLETE METABOLIC PANEL WITH GFR  Checking CBC and CMP for medication monitoring on continued use of Enbrel.  Has had some increased upper respiratory symptoms but not clear if true infection or may be allergies and not requiring any antibiotic treatment with improvement now.  Orders: Orders Placed This Encounter  Procedures   Sedimentation rate   C-reactive protein   CBC with Differential/Platelet   COMPLETE METABOLIC PANEL WITH GFR   Meds ordered this encounter  Medications   predniSONE (DELTASONE) 10 MG tablet    Sig: Take 2 tablets (20 mg total) by mouth daily with breakfast for 14 days, THEN 1.5 tablets (15 mg total) daily with breakfast for 14 days, THEN 1 tablet (10 mg total) daily with breakfast. TAKE ONE TABLET BY MOUTH DAILY AS DIRECTED.    Dispense:  60 tablet    Refill:  1     Follow-Up Instructions: Return in about 3 months (around 08/29/2022) for RA on ENB/SSZ/GC f/u 3mos.   Fuller Plan, MD  Note - This record has been created using [employer].  Chart creation errors have been sought, but may not always  have been located. Such creation errors do not reflect on  the standard of medical care.

## 2022-05-31 LAB — COMPLETE METABOLIC PANEL WITH GFR
AG Ratio: 1.4 (calc) (ref 1.0–2.5)
ALT: 12 U/L (ref 6–29)
AST: 18 U/L (ref 10–35)
Albumin: 3.9 g/dL (ref 3.6–5.1)
Alkaline phosphatase (APISO): 58 U/L (ref 37–153)
BUN/Creatinine Ratio: 21 (calc) (ref 6–22)
BUN: 22 mg/dL (ref 7–25)
CO2: 26 mmol/L (ref 20–32)
Calcium: 9.5 mg/dL (ref 8.6–10.4)
Chloride: 101 mmol/L (ref 98–110)
Creat: 1.04 mg/dL — ABNORMAL HIGH (ref 0.60–1.00)
Globulin: 2.7 g/dL (calc) (ref 1.9–3.7)
Glucose, Bld: 88 mg/dL (ref 65–99)
Potassium: 4.5 mmol/L (ref 3.5–5.3)
Sodium: 140 mmol/L (ref 135–146)
Total Bilirubin: 0.6 mg/dL (ref 0.2–1.2)
Total Protein: 6.6 g/dL (ref 6.1–8.1)
eGFR: 57 mL/min/{1.73_m2} — ABNORMAL LOW (ref >=60)

## 2022-05-31 LAB — CBC WITH DIFFERENTIAL/PLATELET
Absolute Monocytes: 525 cells/uL (ref 200–950)
Basophils Absolute: 17 cells/uL (ref 0–200)
Basophils Relative: 0.2 %
Eosinophils Absolute: 52 cells/uL (ref 15–500)
Eosinophils Relative: 0.6 %
HCT: 37.8 % (ref 35.0–45.0)
Hemoglobin: 12 g/dL (ref 11.7–15.5)
Lymphs Abs: 2304.8 cells/uL (ref 850–3900)
MCH: 31.4 pg (ref 27.0–33.0)
MCHC: 31.7 g/dL — ABNORMAL LOW (ref 32.0–36.0)
MCV: 99 fL (ref 80.0–100.0)
MPV: 10.8 fL (ref 7.5–12.5)
Monocytes Relative: 6.1 %
Neutro Abs: 5702 cells/uL (ref 1500–7800)
Neutrophils Relative %: 66.3 %
Platelets: 244 10*3/uL (ref 140–400)
RBC: 3.82 10*6/uL (ref 3.80–5.10)
RDW: 14.3 % (ref 11.0–15.0)
Total Lymphocyte: 26.8 %
WBC: 8.6 10*3/uL (ref 3.8–10.8)

## 2022-05-31 LAB — SEDIMENTATION RATE: Sed Rate: 11 mm/h (ref 0–30)

## 2022-05-31 LAB — C-REACTIVE PROTEIN: CRP: 4.3 mg/L (ref <8.0)

## 2022-05-31 NOTE — Progress Notes (Signed)
Lab results look fine for continuing medications as planned. Her sedimentation rate and CRP are normal probably due to the increased prednisone dose.

## 2022-06-05 ENCOUNTER — Other Ambulatory Visit: Payer: Self-pay | Admitting: *Deleted

## 2022-06-05 DIAGNOSIS — Z79899 Other long term (current) drug therapy: Secondary | ICD-10-CM

## 2022-06-05 DIAGNOSIS — M0600 Rheumatoid arthritis without rheumatoid factor, unspecified site: Secondary | ICD-10-CM

## 2022-06-05 MED ORDER — ENBREL SURECLICK 50 MG/ML ~~LOC~~ SOAJ: 50.0000 mg | SUBCUTANEOUS | 0 refills | Status: DC

## 2022-06-05 NOTE — Telephone Encounter (Signed)
Last Fill: 12/13/2021  Labs: 05/30/2022 Lab results look fine for continuing medications as planned. Her sedimentation rate and CRP are normal probably due to the increased prednisone dose.   TB Gold: 10/09/2021 NEGATIVE   Next Visit: 09/10/2022  Last Visit: 05/30/2022  AQ:[id number] rheumatoid arthritis   Current Dose per office note 05/30/2022: Enbrel 50 mg subcu weekly   Okay to refill Enbrel?

## 2022-06-12 ENCOUNTER — Encounter: Payer: Self-pay | Admitting: Family Medicine

## 2022-06-13 ENCOUNTER — Other Ambulatory Visit: Payer: Self-pay | Admitting: Obstetrics and Gynecology

## 2022-06-15 ENCOUNTER — Other Ambulatory Visit: Payer: Self-pay | Admitting: Internal Medicine

## 2022-06-15 DIAGNOSIS — M0600 Rheumatoid arthritis without rheumatoid factor, unspecified site: Secondary | ICD-10-CM

## 2022-07-13 ENCOUNTER — Encounter: Payer: Medicare Other | Admitting: Family Medicine

## 2022-07-13 ENCOUNTER — Ambulatory Visit (INDEPENDENT_AMBULATORY_CARE_PROVIDER_SITE_OTHER): Payer: Medicare Other | Admitting: Family Medicine

## 2022-07-13 ENCOUNTER — Telehealth: Payer: Self-pay | Admitting: Family Medicine

## 2022-07-13 ENCOUNTER — Encounter: Payer: Self-pay | Admitting: Family Medicine

## 2022-07-13 ENCOUNTER — Telehealth: Payer: Self-pay | Admitting: Orthopaedic Surgery

## 2022-07-13 VITALS — BP 160/88 | HR 73 | Temp 97.2°F | Ht 67.0 in | Wt 227.0 lb

## 2022-07-13 DIAGNOSIS — E559 Vitamin D deficiency, unspecified: Secondary | ICD-10-CM | POA: Diagnosis not present

## 2022-07-13 DIAGNOSIS — M5442 Lumbago with sciatica, left side: Secondary | ICD-10-CM | POA: Diagnosis not present

## 2022-07-13 DIAGNOSIS — E2839 Other primary ovarian failure: Secondary | ICD-10-CM

## 2022-07-13 DIAGNOSIS — M8580 Other specified disorders of bone density and structure, unspecified site: Secondary | ICD-10-CM

## 2022-07-13 DIAGNOSIS — I10 Essential (primary) hypertension: Secondary | ICD-10-CM

## 2022-07-13 DIAGNOSIS — J449 Chronic obstructive pulmonary disease, unspecified: Secondary | ICD-10-CM

## 2022-07-13 DIAGNOSIS — D649 Anemia, unspecified: Secondary | ICD-10-CM

## 2022-07-13 DIAGNOSIS — E538 Deficiency of other specified B group vitamins: Secondary | ICD-10-CM | POA: Diagnosis not present

## 2022-07-13 DIAGNOSIS — S22080S Wedge compression fracture of T11-T12 vertebra, sequela: Secondary | ICD-10-CM | POA: Diagnosis not present

## 2022-07-13 DIAGNOSIS — M5441 Lumbago with sciatica, right side: Secondary | ICD-10-CM

## 2022-07-13 DIAGNOSIS — S22000A Wedge compression fracture of unspecified thoracic vertebra, initial encounter for closed fracture: Secondary | ICD-10-CM | POA: Insufficient documentation

## 2022-07-13 DIAGNOSIS — G8929 Other chronic pain: Secondary | ICD-10-CM

## 2022-07-13 DIAGNOSIS — F609 Personality disorder, unspecified: Secondary | ICD-10-CM

## 2022-07-13 DIAGNOSIS — F319 Bipolar disorder, unspecified: Secondary | ICD-10-CM

## 2022-07-13 DIAGNOSIS — Z9181 History of falling: Secondary | ICD-10-CM

## 2022-07-13 DIAGNOSIS — E039 Hypothyroidism, unspecified: Secondary | ICD-10-CM

## 2022-07-13 DIAGNOSIS — Z131 Encounter for screening for diabetes mellitus: Secondary | ICD-10-CM

## 2022-07-13 DIAGNOSIS — F329 Major depressive disorder, single episode, unspecified: Secondary | ICD-10-CM

## 2022-07-13 DIAGNOSIS — E7800 Pure hypercholesterolemia, unspecified: Secondary | ICD-10-CM | POA: Diagnosis not present

## 2022-07-13 DIAGNOSIS — I2699 Other pulmonary embolism without acute cor pulmonale: Secondary | ICD-10-CM | POA: Insufficient documentation

## 2022-07-13 LAB — COMPREHENSIVE METABOLIC PANEL
ALT: 15 U/L (ref 0–35)
AST: 18 U/L (ref 0–37)
Albumin: 3.8 g/dL (ref 3.5–5.2)
Alkaline Phosphatase: 82 U/L (ref 39–117)
BUN: 21 mg/dL (ref 6–23)
CO2: 32 mEq/L (ref 19–32)
Calcium: 9.6 mg/dL (ref 8.4–10.5)
Chloride: 99 mEq/L (ref 96–112)
Creatinine, Ser: 1.1 mg/dL (ref 0.40–1.20)
GFR: 50.36 mL/min — ABNORMAL LOW (ref >60.00)
Glucose, Bld: 91 mg/dL (ref 70–99)
Potassium: 5 mEq/L (ref 3.5–5.1)
Sodium: 141 mEq/L (ref 135–145)
Total Bilirubin: 0.5 mg/dL (ref 0.2–1.2)
Total Protein: 6.4 g/dL (ref 6.0–8.3)

## 2022-07-13 LAB — VITAMIN B12: Vitamin B-12: >1500 pg/mL — ABNORMAL HIGH (ref 211–911)

## 2022-07-13 LAB — TSH: TSH: 2.76 u[IU]/mL (ref 0.35–5.50)

## 2022-07-13 LAB — LIPID PANEL
Cholesterol: 177 mg/dL (ref 0–200)
HDL: 54.9 mg/dL (ref >39.00)
LDL Cholesterol: 99 mg/dL (ref 0–99)
NonHDL: 122.17
Total CHOL/HDL Ratio: 3
Triglycerides: 117 mg/dL (ref 0.0–149.0)
VLDL: 23.4 mg/dL (ref 0.0–40.0)

## 2022-07-13 LAB — CBC WITH DIFFERENTIAL/PLATELET
Basophils Absolute: 0 10*3/uL (ref 0.0–0.1)
Basophils Relative: 0.5 % (ref 0.0–3.0)
Eosinophils Absolute: 0 10*3/uL (ref 0.0–0.7)
Eosinophils Relative: 0.5 % (ref 0.0–5.0)
HCT: 37.2 % (ref 36.0–46.0)
Hemoglobin: 12.1 g/dL (ref 12.0–15.0)
Lymphocytes Relative: 25.3 % (ref 12.0–46.0)
Lymphs Abs: 2.1 10*3/uL (ref 0.7–4.0)
MCHC: 32.5 g/dL (ref 30.0–36.0)
MCV: 98.9 fl (ref 78.0–100.0)
Monocytes Absolute: 0.4 10*3/uL (ref 0.1–1.0)
Monocytes Relative: 5.4 % (ref 3.0–12.0)
Neutro Abs: 5.6 10*3/uL (ref 1.4–7.7)
Neutrophils Relative %: 68.3 % (ref 43.0–77.0)
Platelets: 246 10*3/uL (ref 150.0–400.0)
RBC: 3.76 Mil/uL — ABNORMAL LOW (ref 3.87–5.11)
RDW: 16.4 % — ABNORMAL HIGH (ref 11.5–15.5)
WBC: 8.2 10*3/uL (ref 4.0–10.5)

## 2022-07-13 LAB — IRON: Iron: 56 ug/dL (ref 42–145)

## 2022-07-13 LAB — VITAMIN D 25 HYDROXY (VIT D DEFICIENCY, FRACTURES): VITD: 37.38 ng/mL (ref 30.00–100.00)

## 2022-07-13 LAB — HEMOGLOBIN A1C: Hgb A1c MFr Bld: 5.2 % (ref 4.6–6.5)

## 2022-07-13 NOTE — Assessment & Plan Note (Signed)
Tsh today  F/u for annual exam  Taking levothyroxine 50 mcg daily

## 2022-07-13 NOTE — Assessment & Plan Note (Signed)
Ongiong high lumbar and low thoracic Recenlly hosp for worsened symptoms after a fall  Reviewed hospital records, lab results and studies in detail   Old T12 and T9 comp fracture noted  Sees pain specialist  Has tramadol  Was therapeutic with oxycodone in hospital  Using stand walker  Just left her rehab and [facility] will start PT for this and also balance Discussed fall prevention  Motivated to get better

## 2022-07-13 NOTE — Assessment & Plan Note (Signed)
Worse in hospital recently  May have been dillutional Cbc with iron today  Reviewed hospital records, lab results and studies in detail

## 2022-07-13 NOTE — Assessment & Plan Note (Signed)
bp is up due to acute pain  BP Readings from Last 1 Encounters:  07/13/22 (!) 160/88   F/u planned soon for re check Most recent labs reviewed  Disc lifstyle change with low sodium diet and exercise  Plan to continue metoprolol xl 12.5 mg daily  Pulse of 73

## 2022-07-13 NOTE — Assessment & Plan Note (Signed)
A1c ordered On prednisone obese

## 2022-07-13 NOTE — Assessment & Plan Note (Signed)
Lab today  Simvastatin 20 mg daily  For annual visit to review

## 2022-07-13 NOTE — Assessment & Plan Note (Signed)
Continues psychiatry care No clinical changes

## 2022-07-13 NOTE — Assessment & Plan Note (Signed)
T12 and T9 Seen incidentally on imaging Per MRI- likely old and procedure  not recommended   Dexa ordered

## 2022-07-13 NOTE — Assessment & Plan Note (Signed)
Will be getting ketamine infusion soon from pain center This may also help depression  Sees psychiatry  PHq is up but also pain is bad today   Has good support

## 2022-07-13 NOTE — Assessment & Plan Note (Signed)
No clinical changes Pulse ox is lower when in pain  90% today RA Not smoking

## 2022-07-13 NOTE — Telephone Encounter (Signed)
Please ok that verbal order  

## 2022-07-13 NOTE — Assessment & Plan Note (Signed)
Due for dexa Last one normal in 2019 No longer smoking but now on chronic prednisone  Had old T9 and 12 fracture on recent xray  Taking vit D3 Ordered her dexa for med center [place] and she will call to schedule

## 2022-07-13 NOTE — Assessment & Plan Note (Signed)
B12 added to labs F/u for annual exam

## 2022-07-13 NOTE — Telephone Encounter (Signed)
VO given to [employer]

## 2022-07-13 NOTE — Assessment & Plan Note (Signed)
Continues vit D Level today

## 2022-07-13 NOTE — Telephone Encounter (Signed)
Patient needing a refill on her Hydrocodone 5/325 mg. Pleas call when donw

## 2022-07-13 NOTE — Telephone Encounter (Signed)
Home Health verbal orders Caller Name: Molly Maduro Agency Name: [facility]  Callback number: [phone], may leave message if need be  Requesting OT/PT/Skilled nursing/Social Work/Speech: OT  Reason: Home safety, strength and ADL's   Frequency: 2wk 4, ending 07/16/22  Please forward to [facility] pool or providers CMA

## 2022-07-13 NOTE — Assessment & Plan Note (Signed)
Stable Continues psychiatry care/no med changes

## 2022-07-13 NOTE — Assessment & Plan Note (Signed)
Ref for dexa 

## 2022-07-13 NOTE — Progress Notes (Signed)
Subjective:    Patient ID: Lori Orozco, female    DOB: Nov 03, 1950, 72 y.o.   MRN: [id number]  HPI Pt presents for f/u of hospitalization for back pain after a fall  Wt Readings from Last 3 Encounters:  07/13/22 227 lb (103 kg)  05/30/22 218 lb (98.9 kg)  02/28/22 214 lb (97.1 kg)   35.55 kg/m  Vitals:   07/13/22 1131  BP: (!) 160/88  Pulse: 73  Temp: (!) 97.2 F (36.2 C)  SpO2: 90%   Pr presented to hosp on 4/30  Had fallen the Thursday prior -lost balance falling on buttocks  Back pain worsened after that and tramadol was not working  Dx with old T9 and T12 comp fracture  Pulse ox was 81% at admission -thought to be due to pain in setting of copd     Hospital Course:  1. Acute hypoxic respiratory failure secondary to chronic COPD without any significant exacerbation Patient had recovered without much treatment. I really do feel that her hypoxia was related to her fall and acute pain rather than any sort of infection or exacerbation of her COPD. Nonetheless she does have fairly severe emphysema do currently she does not require oxygen with exertion her oxygen saturation does go up to low 90s. She already has her inhalers and this was not changed 2. 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 Seronegative rheumatoid arthritis Chronic pain Old compression fractures of T9 and T12. Please note that the patient had suffered couple of falls with worsening of her pain. She presented with above in our emergency room was evaluated by neurosurgery and interventional radiology. MRI revealed that her compression fractures are old and vertebroplasty was not recommended. Patient pain is currently at her baseline She is chronically on Enbrel and prednisone 10 mg. She does have a Biotronik SCS since 2/24 Continues to take Voltaren. Pain clinic on last visit had recommended possible ketamine infusion but this was not done yet 3. Ongoing  tobacco abuse counseling was done 4. History of depression and anxiety on the baseline medications 5. Hypertension continue same medications 6. Chronic generalized weakness with worsening of gait instability. Patient is agreeable to transfer to an inpatient rehab of [facility] for further strengthening with a goal of being able to return home     Mri report Result Date: 06/27/2022 MRI THORACIC SPINE WITHOUT CONTRAST HISTORY: Thoracic compression fractures; determine acuteness; pt has remote to turn stimulator off COMPARISON: Bone windows from CT chest of 06/26/2022 TECHNIQUE: Multiplanar, multisequence non-contrast MRI of the thoracic spine was performed. Imaging sequences included sagittal and axial acquisitions. FINDINGS: Refer to sagittal images for vertebral level labeling. Normal anatomic alignment is preserved. No acute fractures. Chronic greater than 75% compression fracture at T12, with 5 mm of superior endplate retropulsion. Chronic approximately 25% T9 superior endplate fracture with less than 2 mm of superior endplate retropulsion. Multilevel disc space narrowing and disc desiccation. Multilevel mild endplate osteophytes. Marrow signal is unremarkable. Small hemangioma anterior and L1. Cord signal is difficult to assess in the mid to distal thoracic spine due to presence of spinal stimulator leads. Spinal stimulator leads extend to T7-8. Mild associated artifact. Visualized perivertebral soft tissues are unremarkable. No evidence of disc protrusion. Superior endplate retropulsion at T12 mildly effaces the ventral thecal sac, without significant spinal stenosis. Remainder of the levels show no significant canal or foraminal stenosis.   IMPRESSION: 1. Chronic compression fractures T9 and T12 as above. 2. No significant spinal sten  [facility]  Now just got home and will [facility] PT for 4 months   Pain is so bad      07/13/2022   11:41 AM 08/02/2021    2:02 PM 07/11/2021     8:50 AM 07/10/2021    3:50 PM 05/16/2021    2:59 PM  Depression screen PHQ 2/9  Decreased Interest 3 0 0 0 0  Down, Depressed, Hopeless 3 3 0 3 0  PHQ - 2 Score 6 3 0 3 0  Altered sleeping 2  0 0   Tired, decreased energy 3  3 1    Change in appetite 1  3 0   Feeling bad or failure about yourself  0  0 0   Trouble concentrating 3  1 0   Moving slowly or fidgety/restless 3   0   Suicidal thoughts 0  0 0   PHQ-9 Score 18  7 4    Difficult doing work/chores Very difficult   Not difficult at all    Will be starting some ketamine infusion      Lab from 5/12 Bmp with cr of 1.00 and ca at 9.1 and GFR of 60 Cbc with hb of 10.4  (? 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   Bilateral leg weakness 10/09/2021   Vitamin B12 deficiency 08/02/2021   Myofascial pain dysfunction syndrome 08/02/2021   Adverse effect of prednisone 07/11/2021   Prediabetes 07/11/2021   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   History of pulmonary embolism 12/08/2020   Arthritis of knee 12/06/2020   Bilateral knee pain 09/20/2020   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   High risk medication use 09/20/2020   Extensor tenosynovitis of right wrist 09/15/2020   S/P reverse total shoulder arthroplasty, 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   Lung nodule 01/02/2020   Viral URI with cough 03/13/2019   Status post arthroscopy of left shoulder 01/16/2018    Estrogen deficiency 06/28/2017   Medial epicondylitis, left elbow 04/29/2017   S/P arthroscopy of left shoulder 02/25/2017   Impingement syndrome of left shoulder 01/30/2017   Pedal edema 10/26/2016   Trochanteric bursitis, right hip 04/10/2016   Essential hypertension 09/02/2015   Urge incontinence 07/27/2015   Obesity 04/28/2015   Screening for HIV (human immunodeficiency virus) 04/26/2015   Osteoarthritis of left knee 01/28/2015   Status post total left knee replacement 01/28/2015   Vitamin D deficiency 04/27/2014   Seasonal and perennial allergic rhinitis 06/17/2013   S/P total hip arthroplasty 03/20/2013   Medicare annual wellness visit, subsequent 11/18/2012   History of falling 11/18/2012   Routine general medical examination at a health care facility 11/10/2012   Lichen sclerosus et atrophicus of the vulva 07/29/2012   Osteopenia 10/19/2011   Hypothyroidism 10/19/2011   ANEMIA 10/11/2009   Hereditary and idiopathic peripheral neuropathy 08/05/2009   Low back  pain 08/05/2009   HAND PAIN, BILATERAL 08/05/2009   TREMOR 03/09/2008   MENOPAUSAL SYNDROME 10/09/2007   DEPRESSION, MAJOR 05/20/2007   BIPOLAR AFFECTIVE DISORDER 05/20/2007   Generalized anxiety disorder 05/20/2007   PERSONALITY DISORDER 05/20/2007   ESOPHAGEAL SPASM 05/20/2007   Diaphragmatic hernia 05/20/2007   AMAUROSIS FUGAX 05/07/2007   COPD mixed type (HCC) 03/27/2007   HYPERCHOLESTEROLEMIA, PURE 12/10/2006   SYMPTOM, SYNDROME, CHRONIC FATIGUE 12/10/2006   Past Medical History:  Diagnosis Date   Allergy Hayfever   1958   Amaurosis fugax    Anemia    hx   Anxiety    Arthritis    Asthma    Cataract    Chronic fatigue syndrome    Chronic kidney disease    frequency, nephritis when 72 yrs old   COPD (chronic obstructive pulmonary disease) (HCC)    COVID-19    Depression    Diverticulitis    Emphysema of lung (HCC)    Fibromyalgia    GERD (gastroesophageal reflux disease)    occ   H/O  hiatal hernia    History of bronchitis    Hyperlipidemia    Hypothyroidism    Interstitial cystitis    Irritable bowel syndrome    Lichen sclerosus    Lumbar herniated disc    Migraines    Motor nerve conduction block 11/2021   Pneumonia    PONV (postoperative nausea and vomiting)    Shortness of breath    exertion    Thyroid disease    Graves   Urinary frequency    Urinary tract infection    Vertigo    Past Surgical History:  Procedure Laterality Date   ABDOMINAL HYSTERECTOMY     bladder sugery      CAROTID DOPPLAR     COLONOSCOPY  05/26/2010   avms- otherwise nl , re check 10y   DEXA-OSTEOPENIA     DOPPLER ECHOCARDIOGRAPHY     elbow surgery     EPICONDYLITIS     EYE SURGERY Bilateral    cataracts   FOOT SURGERY Bilateral    I & D EXTREMITY Right 09/01/2020   Procedure: TYNOSYNOVECTOMY;  Surgeon: [name], MD;  Location: [facility] OR;  Service: Orthopedics;  Laterality: Right;   IMPLANTATION VAGAL NERVE STIMULATOR  03/2022   JOINT REPLACEMENT     KNEE SURGERY     Left cartilage   lipoma in second finger right hand     PLANTAR FASCIA SURGERY Left    REVERSE SHOULDER ARTHROPLASTY Left 02/02/2020   Procedure: LEFT REVERSE SHOULDER ARTHROPLASTY;  Surgeon: [name], MD;  Location: [facility] OR;  Service: Orthopedics;  Laterality: Left;   ROTATOR CUFF REPAIR Left 12/2017   TEMPOROMANDIBULAR JOINT SURGERY     TONSILLECTOMY     TOTAL HIP ARTHROPLASTY Right 03/20/2013   Procedure: Right TOTAL HIP ARTHROPLASTY;  Surgeon: [name], MD;  Location: [facility] OR;  Service: Orthopedics;  Laterality: Right;  Right Total Hip Arthroplasty   TOTAL KNEE ARTHROPLASTY Left 01/28/2015   Procedure: LEFT TOTAL KNEE ARTHROPLASTY;  Surgeon: [name], MD;  Location: [facility] ORS;  Service: Orthopedics;  Laterality: Left;   TRIGGER FINGER RELEASE Right    TROCHANTERIC BURSA EXCISION Right 05/03/2016   Dr. [name], [name]   WRIST ARTHROSCOPY Right    ligament tear    Social History   Tobacco Use   Smoking status: Former    Packs/day: 2.00    Years: 43.00    Additional pack years: 0.00  Total pack years: 86.00    Types: Cigarettes    Quit date: 02/26/2013    Years since quitting: 9.3    Passive exposure: Never   Smokeless tobacco: Never   Tobacco comments:    Vaping occasionally  Vaping Use   Vaping Use: Some days   Devices: uses about once per month  Substance Use Topics   Alcohol use: No   Drug use: No   Family History  Problem Relation Age of Onset   Arthritis Mother    Hypertension Mother    Kidney disease Mother    Heart disease Mother    Stroke Mother    Cancer Father        Bladder   Arthritis Sister    Heart disease Sister    Colon cancer Other    Cancer - Lung Cousin    Arthritis Maternal Grandmother    Hearing loss Maternal Grandfather    Varicose Veins Maternal Uncle    Allergies  Allergen Reactions   Lithium Anaphylaxis   Tegretol [Carbamazepine] Other (See Comments)    Fever and body aches (fever over 103)   Tricyclic Antidepressants Anaphylaxis    Other reaction(s): Unknown   Methotrexate Derivatives Other (See Comments)    Urinary retention and dizziness  Other reaction(s): Other UTI   Strawberry Extract Hives   Tapentadol Rash    PT ALLERGIC NYLON TAPE    Codeine Nausea Only    Makes pt stay awake   Cymbalta [Duloxetine Hcl] Other (See Comments)    Makes pt pass out    Erythromycin     abdominal pain   Lyrica [Pregabalin]     Felt faint   Neurontin [Gabapentin]     Passes  out   Rabeprazole Sodium     insomnia   Baclofen Nausea Only    Sever nausea   Duraprep [Antiseptic Products, Misc.] Itching and Rash    Tolerates Betadine    Penicillins Rash    Tolerated ANCEF 02/02/20  Has patient had a PCN reaction causing immediate rash, facial/tongue/throat swelling, SOB or lightheadedness with hypotension: Yes Has patient had a PCN reaction causing severe rash involving mucus membranes or skin  necrosis: No Has patient had a PCN reaction that required hospitalization No Has patient had a PCN reaction occurring within the last 10 years: No If all of the above answers are "NO", then may proceed with Cephalosporin use.    Tape Rash    PT ALLERGIC NYLON TAPE    Current Outpatient Medications on File Prior to Visit  Medication Sig Dispense Refill   albuterol (VENTOLIN HFA) 108 (90 Base) MCG/ACT inhaler Inhale 1-2 puffs into the lungs every 4 (four) hours as needed for wheezing or shortness of breath. 1 each 0   benzonatate (TESSALON) 200 MG capsule Take 1 capsule (200 mg total) by mouth 3 (three) times daily as needed for cough. Swallow whole 30 capsule 1   Black Cohosh 40 MG CAPS Take 1 capsule (40 mg total) by mouth daily. 30 capsule 11   Budeson-Glycopyrrol-Formoterol (BREZTRI AEROSPHERE) 160-9-4.8 MCG/ACT AERO Inhale 2 puffs into the lungs 2 (two) times daily. 3 each 3   buPROPion (WELLBUTRIN XL) 150 MG 24 hr tablet Take 150 mg by mouth daily.     busPIRone (BUSPAR) 15 MG tablet Take 15 mg by mouth 2 (two) times daily before a meal.     cholecalciferol (VITAMIN D) 25 MCG (1000 UNIT) tablet Take 2,000 Units by mouth daily. 2 tablets daily  clobetasol cream (TEMOVATE) 0.05 % APPLY SMALL AMOUNT TO AFFECTED AREAS TWO TO THREE TIMES PER WEEK 30 g 4   cyanocobalamin 2000 MCG tablet Take 1,000 mcg by mouth daily.     diclofenac Sodium (VOLTAREN) 1 % GEL Apply 2 g topically 2 (two) times daily as needed (pain).     diphenhydrAMINE (BENADRYL) 25 MG tablet Take 25 mg by mouth every 6 (six) hours as needed for allergies.     divalproex (DEPAKOTE ER) 500 MG 24 hr tablet Take 500 mg by mouth at bedtime.      etanercept (ENBREL SURECLICK) 50 MG/ML injection Inject 50 mg into the skin once a week. 12 mL 0   fluticasone (FLONASE) 50 MCG/ACT nasal spray PLACE 2 SPRAYS INTO THE NOSE DAILY. 16 g 11   HYDROcodone-acetaminophen (NORCO/VICODIN) 5-325 MG tablet Take 1 tablet by mouth 3 (three) times  daily as needed for moderate pain. 30 tablet 0   ipratropium-albuterol (DUONEB) 0.5-2.5 (3) MG/3ML SOLN Take 3 mLs by nebulization every 6 (six) hours as needed. 120 mL prn   ketotifen (ZADITOR) 0.025 % ophthalmic solution Place 1 drop into both eyes 2 (two) times daily as needed (allergies).     levothyroxine (SYNTHROID) 50 MCG tablet Take 1 tablet (50 mcg total) by mouth daily before breakfast. 90 tablet 1   LORazepam (ATIVAN) 1 MG tablet Take 1 mg by mouth at bedtime as needed for anxiety or sleep.     metoprolol succinate (TOPROL-XL) 25 MG 24 hr tablet TAKE 1/2 TABLET BY MOUTH EVERY MORNING AND EVERY NIGHT AT BEDTIME 90 tablet 1   mirabegron ER (MYRBETRIQ) 25 MG TB24 tablet Take 1 tablet (25 mg total) by mouth in the morning and at bedtime. 180 tablet 3   mirtazapine (REMERON) 30 MG tablet Take 30 mg by mouth at bedtime.      ondansetron (ZOFRAN-ODT) 4 MG disintegrating tablet Take 1 tablet (4 mg total) by mouth every 8 (eight) hours as needed for nausea or vomiting. 20 tablet 1   Oxycodone HCl 10 MG TABS Take 1 tablet by mouth every 4 (four) hours as needed.     predniSONE (DELTASONE) 10 MG tablet Take 2 tablets (20 mg total) by mouth daily with breakfast for 14 days, THEN 1.5 tablets (15 mg total) daily with breakfast for 14 days, THEN 1 tablet (10 mg total) daily with breakfast. TAKE ONE TABLET BY MOUTH DAILY AS DIRECTED. 60 tablet 1   Rhubarb (ESTROVEN COMPLETE) 4 MG TABS Take 4 mg by mouth daily. 30 tablet 11   sertraline (ZOLOFT) 100 MG tablet Take 100 mg by mouth daily.      simvastatin (ZOCOR) 20 MG tablet TAKE ONE TABLET BY MOUTH EVERY NIGHT AT BEDTIME 90 tablet 3   Spacer/Aero-Holding Chambers (AEROCHAMBER PLUS) inhaler Use with inhaler 1 each 2   traMADol (ULTRAM) 50 MG tablet Take 50 mg by mouth every 6 (six) hours as needed.     No current facility-administered medications on file prior to visit.     Review of Systems  Constitutional:  Positive for fatigue. Negative for activity  change, appetite change, fever and unexpected weight change.  HENT:  Negative for congestion, ear pain, rhinorrhea, sinus pressure and sore throat.   Eyes:  Negative for pain, redness and visual disturbance.  Respiratory:  Negative for cough, shortness of breath and wheezing.        No change in baseline sob  Cardiovascular:  Negative for chest pain and palpitations.  Gastrointestinal:  Negative  for abdominal pain, blood in stool, constipation and diarrhea.  Endocrine: Negative for polydipsia and polyuria.  Genitourinary:  Negative for dysuria, frequency and urgency.  Musculoskeletal:  Positive for arthralgias and back pain. Negative for myalgias.  Skin:  Negative for pallor and rash.  Allergic/Immunologic: Negative for environmental allergies.  Neurological:  Negative for dizziness, syncope and headaches.  Hematological:  Negative for adenopathy. Does not bruise/bleed easily.  Psychiatric/Behavioral:  Positive for dysphoric mood and sleep disturbance. Negative for decreased concentration. The patient is not nervous/anxious.        Objective:   Physical Exam Constitutional:      General: She is not in acute distress.    Appearance: Normal appearance. She is well-developed. She is obese. She is not ill-appearing.  HENT:     Head: Normocephalic and atraumatic.  Eyes:     Conjunctiva/sclera: Conjunctivae normal.     Pupils: Pupils are equal, round, and reactive to light.  Neck:     Thyroid: No thyromegaly.     Vascular: No carotid bruit or JVD.  Cardiovascular:     Rate and Rhythm: Normal rate and regular rhythm.     Heart sounds: Normal heart sounds.     No gallop.  Pulmonary:     Effort: Pulmonary effort is normal. No respiratory distress.     Breath sounds: Normal breath sounds. No wheezing, rhonchi or rales.     Comments: Diffusely distant bs  Abdominal:     General: There is no distension or abdominal bruit.     Palpations: Abdomen is soft.  Musculoskeletal:     Cervical  back: Normal range of motion and neck supple.     Right lower leg: No edema.     Left lower leg: No edema.     Comments: Slight TS  and LS tenderness Loss of lordosis with spasm  Lymphadenopathy:     Cervical: No cervical adenopathy.  Skin:    General: Skin is warm and dry.     Coloration: Skin is not pale.     Findings: No rash.  Neurological:     Mental Status: She is alert.     Coordination: Coordination normal.     Deep Tendon Reflexes: Reflexes are normal and symmetric. Reflexes normal.     Comments: Uses standing walker Gait is steady  Psychiatric:        Mood and Affect: Mood normal.           Assessment & Plan:   Problem List Items Addressed This Visit       Cardiovascular and Mediastinum   Essential hypertension    bp is up due to acute pain  BP Readings from Last 1 Encounters:  07/13/22 (!) 160/88  F/u planned soon for re check Most recent labs reviewed  Disc lifstyle change with low sodium diet and exercise  Plan to continue metoprolol xl 12.5 mg daily  Pulse of 73      Relevant Orders   TSH   Lipid panel   Comprehensive metabolic panel   CBC with Differential/Platelet   Other acute pulmonary embolism without acute cor pulmonale (HCC)     Respiratory   COPD mixed type (HCC)    No clinical changes Pulse ox is lower when in pain  90% today RA Not smoking        Endocrine   Hypothyroidism    Tsh today  F/u for annual exam  Taking levothyroxine 50 mcg daily       Relevant  Orders   TSH     Musculoskeletal and Integument   Osteopenia    Due for dexa Last one normal in 2019 No longer smoking but now on chronic prednisone  Had old T9 and 12 fracture on recent xray  Taking vit D3 Ordered her dexa for med center [facility] and she will call to schedule       Thoracic compression fracture (HCC) - Primary    T12 and T9 Seen incidentally on imaging Per MRI- likely old and procedure  not recommended   Dexa ordered        Other    ANEMIA    Worse in hospital recently  May have been dillutional Cbc with iron today  Reviewed hospital records, lab results and studies in detail        Relevant Orders   CBC with Differential/Platelet   Iron   BIPOLAR AFFECTIVE DISORDER    Stable Continues psychiatry care/no med changes       DEPRESSION, MAJOR    Will be getting ketamine infusion soon from pain center This may also help depression  Sees psychiatry  PHq is up but also pain is bad today   Has good support       Diabetes mellitus screening    A1c ordered On prednisone obese      Relevant Orders   Hemoglobin A1c   Estrogen deficiency    Ref for dexa      Relevant Orders   DG Bone Density   History of falling    Recent fall on buttocks  Hosp admit for pain control followed by in pt rehab stay  Reviewed hospital records, lab results and studies in detail   Using stand walker  HH starting PT soon for strength and balance       HYPERCHOLESTEROLEMIA, PURE    Lab today  Simvastatin 20 mg daily  For annual visit to review       Relevant Orders   Lipid panel   Low back pain    Ongiong high lumbar and low thoracic Recenlly hosp for worsened symptoms after a fall  Reviewed hospital records, lab results and studies in detail   Old T12 and T9 comp fracture noted  Sees pain specialist  Has tramadol  Was therapeutic with oxycodone in hospital  Using stand walker  Just left her rehab and HH will start PT for this and also balance Discussed fall prevention  Motivated to get better       Relevant Medications   Oxycodone HCl 10 MG TABS   PERSONALITY DISORDER    Continues psychiatry care No clinical changes       Vitamin B12 deficiency    B12 added to labs F/u for annual exam      Relevant Orders   Vitamin B12   Vitamin D deficiency    Continues vit D Level today      Relevant Orders   VITAMIN D 25 Hydroxy (Vit-D Deficiency, Fractures)

## 2022-07-13 NOTE — Patient Instructions (Addendum)
Take your vitamin D   Contact your pain clinic   I hope the ketamine treatment goes well   We will see you soon   Keep working with PT   Call and schedule bone density test when convenient   You have an order for:  []   2D Mammogram  []   3D Mammogram  [x]   Bone Density    Med center [facility]   [phone]   Please call for appointment:   []   [facility]  [address] [place] [address]  [phone]  []   [facility])   [address]  [place], [place] [address]  [phone]  []   [facility]      [address] [place], [place]        [phone]         []   [facility]  [address] [place], [place]  [phone]  []  [facility]   [address]   [place], [place] [address]  [phone]  []  [facility]  [address] [place], [place] [address] [phone]    Make sure to wear two piece clothing  No lotions powders or deodorants the day of the appointment Make sure to bring picture ID and insurance card.  Bring list of medications you are currently taking including any supplements.   Schedule your screening mammogram through MyChart!   Select [place] imaging sites can now be scheduled through MyChart.  Log into your MyChart account.  Go to 'Visit' (or 'Appointments' if  on mobile App) --> Schedule an  Appointment  Under 'Select a Reason for Visit' choose the Mammogram  Screening option.  Complete the pre-visit questions  and select the time and place that  best fits your schedule

## 2022-07-13 NOTE — Assessment & Plan Note (Signed)
Recent fall on buttocks  Hosp admit for pain control followed by in pt rehab stay  Reviewed hospital records, lab results and studies in detail   Using stand walker  HH starting PT soon for strength and balance

## 2022-07-16 ENCOUNTER — Encounter: Payer: Self-pay | Admitting: Family Medicine

## 2022-07-16 ENCOUNTER — Ambulatory Visit (INDEPENDENT_AMBULATORY_CARE_PROVIDER_SITE_OTHER): Payer: Medicare Other | Admitting: Family Medicine

## 2022-07-16 VITALS — BP 122/65 | HR 92 | Temp 97.7°F | Ht 67.0 in | Wt 220.5 lb

## 2022-07-16 DIAGNOSIS — E7800 Pure hypercholesterolemia, unspecified: Secondary | ICD-10-CM

## 2022-07-16 DIAGNOSIS — F329 Major depressive disorder, single episode, unspecified: Secondary | ICD-10-CM

## 2022-07-16 DIAGNOSIS — M5442 Lumbago with sciatica, left side: Secondary | ICD-10-CM

## 2022-07-16 DIAGNOSIS — M8580 Other specified disorders of bone density and structure, unspecified site: Secondary | ICD-10-CM

## 2022-07-16 DIAGNOSIS — G8929 Other chronic pain: Secondary | ICD-10-CM

## 2022-07-16 DIAGNOSIS — E039 Hypothyroidism, unspecified: Secondary | ICD-10-CM

## 2022-07-16 DIAGNOSIS — Z6835 Body mass index (BMI) 35.0-35.9, adult: Secondary | ICD-10-CM

## 2022-07-16 DIAGNOSIS — D649 Anemia, unspecified: Secondary | ICD-10-CM

## 2022-07-16 DIAGNOSIS — J449 Chronic obstructive pulmonary disease, unspecified: Secondary | ICD-10-CM | POA: Diagnosis not present

## 2022-07-16 DIAGNOSIS — N904 Leukoplakia of vulva: Secondary | ICD-10-CM

## 2022-07-16 DIAGNOSIS — Z131 Encounter for screening for diabetes mellitus: Secondary | ICD-10-CM

## 2022-07-16 DIAGNOSIS — M5441 Lumbago with sciatica, right side: Secondary | ICD-10-CM

## 2022-07-16 DIAGNOSIS — E559 Vitamin D deficiency, unspecified: Secondary | ICD-10-CM

## 2022-07-16 DIAGNOSIS — I10 Essential (primary) hypertension: Secondary | ICD-10-CM

## 2022-07-16 DIAGNOSIS — N3941 Urge incontinence: Secondary | ICD-10-CM

## 2022-07-16 DIAGNOSIS — Z9181 History of falling: Secondary | ICD-10-CM

## 2022-07-16 DIAGNOSIS — E538 Deficiency of other specified B group vitamins: Secondary | ICD-10-CM

## 2022-07-16 MED ORDER — SIMVASTATIN 20 MG PO TABS: 20.0000 mg | ORAL_TABLET | Freq: Every day | ORAL | 3 refills | Status: DC

## 2022-07-16 NOTE — Assessment & Plan Note (Signed)
Disc goals for lipids and reasons to control them Rev last labs with pt Rev low sat fat diet in detail  LDL down to 99 HDl over 50 Plan to continue simvastaatin 20 mg daily

## 2022-07-16 NOTE — Assessment & Plan Note (Signed)
Oingoing  Thoracic comp fracture Sciatica/ deg disk dz  Did not tolerate gabapentin or lyrica in past Will d/w her pain specialist  About to try ketamine treatment

## 2022-07-16 NOTE — Assessment & Plan Note (Signed)
Lab Results  Component Value Date   HGBA1C 5.2 07/13/2022   Lower then expected in light of prednisone disc imp of low glycemic diet and wt loss to prevent DM2

## 2022-07-16 NOTE — Progress Notes (Signed)
Subjective:    Patient ID: Lori Orozco, female    DOB: October 20, 1950, 72 y.o.   MRN: [id number]  HPI Pt presents for annual f/u of chronic health problems   Wt Readings from Last 3 Encounters:  07/16/22 220 lb 8 oz (100 kg)  07/13/22 227 lb (103 kg)  05/30/22 218 lb (98.9 kg)   34.54 kg/m  Vitals:   07/16/22 1440  BP: (!) 158/78  Pulse: 92  Temp: 97.7 F (36.5 C)  SpO2: 91%     Immunization History  Administered Date(s) Administered   Influenza Split 11/19/2011, 11/02/2013   Influenza Whole 12/20/2006, 11/25/2008, 11/26/2009, 11/24/2010   Influenza, High Dose Seasonal PF 12/13/2016   Influenza,inj,Quad PF,6+ Mos 12/06/2015, 11/28/2021   Influenza-Unspecified 12/08/2012, 12/03/2014, 12/08/2017, 11/10/2018, 10/31/2019   Moderna Sars-Covid-2 Vaccination 06/21/2019, 07/19/2019, 01/22/2020   PFIZER(Purple Top)SARS-COV-2 Vaccination 06/04/2020   Pfizer Covid-19 Vaccine Bivalent Booster 61yrs & up 12/29/2021   Pneumococcal Conjugate-13 12/06/2015   Pneumococcal Polysaccharide-23 01/26/2002, 03/23/2008, 09/07/2010, 06/28/2017   Respiratory Syncytial Virus Vaccine,Recomb Aduvanted(Arexvy) 12/29/2021   Td 06/26/2000, 10/19/2011, 02/14/2022   Zoster Recombinat (Shingrix) 09/16/2017   Health Maintenance Due  Topic Date Due   Zoster Vaccines- Shingrix (2 of 2) 11/11/2017   Lung Cancer Screening  12/14/2020   COVID-19 Vaccine (6 - 2023-24 season) 02/23/2022   Medicare Annual Wellness (AWV)  07/11/2022   Shingrix - may consider but has to check with rheumatology  Lung cancer screening  Last Ct chest was 10.2021  Will discuss at pulmo nvisit in the fall   IMPRESSION: 1. Stable small RIGHT lower lobe pulmonary nodules over 1 year time consistent benign etiology per Fleischner criteria. 2. Fine micro nodularity in the medial middle lobes consistent with chronic inflammation/infection. No interval change.   Mammogram 12/2021= has 6 mo f/u 3rd of June  Self breast  exam: no lumps   Cologuard 06/2020 -negative Sees GI yearly now  No current active GI bleeding    Has not seen gyn in 1.5 years-has not had time No issues with her lichen sclerosis   Dexa 06/2017 -normal range (new dexa was recently ordered after a vert comp fracture)  Former smoker  Falls recent    (tripped again on carpet with sneakers) She tripped over bedside commode that was moved (ems has to come get her back up)  PT is coming tomorrow to help with fall risk  Fractures- recently found chronic compression fractures  Supplements - is back on vitamin D  Exercise - starting some  PT soon   Last vitamin D Lab Results  Component Value Date   VD25OH 37.38 07/13/2022    Mood H/o bipolar /personality disorder and anxiety  Can't catch a break with chronic pain issues  Hard to sleep Psychiatry  Mirtazapine stopped working  Now ativan      07/13/2022   11:41 AM 08/02/2021    2:02 PM 07/11/2021    8:50 AM 07/10/2021    3:50 PM 05/16/2021    2:59 PM  Depression screen PHQ 2/9  Decreased Interest 3 0 0 0 0  Down, Depressed, Hopeless 3 3 0 3 0  PHQ - 2 Score 6 3 0 3 0  Altered sleeping 2  0 0   Tired, decreased energy 3  3 1    Change in appetite 1  3 0   Feeling bad or failure about yourself  0  0 0   Trouble concentrating 3  1 0   Moving slowly or fidgety/restless 3  0   Suicidal thoughts 0  0 0   PHQ-9 Score 18  7 4    Difficult doing work/chores Very difficult   Not difficult at all       Blood pressure  HTN  bp is stable today  No cp or palpitations or headaches or edema  No side effects to medicines  BP Readings from Last 3 Encounters:  07/16/22 122/65  07/13/22 (!) 160/88  05/30/22 129/75     Metoprolol xl 12.5 mg daily  Pulse Readings from Last 3 Encounters:  07/16/22 92  07/13/22 73  05/30/22 73    Last metabolic panel Lab Results  Component Value Date   GLUCOSE 91 07/13/2022   NA 141 07/13/2022   K 5.0 07/13/2022   CL 99 07/13/2022   CO2 32  07/13/2022   BUN 21 07/13/2022   CREATININE 1.10 07/13/2022   EGFR 57 (L) 05/30/2022   CALCIUM 9.6 07/13/2022   PHOS 3.5 10/11/2009   PROT 6.4 07/13/2022   ALBUMIN 3.8 07/13/2022   LABGLOB 2.4 03/20/2022   AGRATIO 1.7 03/20/2022   BILITOT 0.5 07/13/2022   ALKPHOS 82 07/13/2022   AST 18 07/13/2022   ALT 15 07/13/2022   ANIONGAP 7 09/06/2020   GFR of 50.3     Copd- sees Dr Edwena Blow - due for next visit in fhe fall  Due neb or albuterol for rescue Breztri inhaler for mt   Urge incontinence/overactive bladder  Takes myrbetriq 25 mg bid   Hypothyroidism  Pt has no clinical changes (fatigue is her baseline due to CFS) No change in energy level/ hair or skin/ edema and no tremor Lab Results  Component Value Date   TSH 2.76 07/13/2022     Hyperlipidemia Lab Results  Component Value Date   CHOL 177 07/13/2022   CHOL 221 (H) 07/11/2021   CHOL 192 07/08/2020   Lab Results  Component Value Date   HDL 54.90 07/13/2022   HDL 70.50 07/11/2021   HDL 70.30 07/08/2020   Lab Results  Component Value Date   LDLCALC 99 07/13/2022   LDLCALC 123 (H) 07/11/2021   LDLCALC 101 (H) 07/08/2020   Lab Results  Component Value Date   TRIG 117.0 07/13/2022   TRIG 136.0 07/11/2021   TRIG 101.0 07/08/2020   Lab Results  Component Value Date   CHOLHDL 3 07/13/2022   CHOLHDL 3 07/11/2021   CHOLHDL 3 07/08/2020   No results found for: "LDLDIRECT"  Takes simvastatin 20 mg daily    H/o anemia from GI bleed in past Lab Results  Component Value Date   WBC 8.2 07/13/2022   HGB 12.1 07/13/2022   HCT 37.2 07/13/2022   MCV 98.9 07/13/2022   PLT 246.0 07/13/2022   Lab Results  Component Value Date   IRON 56 07/13/2022   FERRITIN 589.1 (H) 05/16/2021   Lab Results  Component Value Date   VITAMINB12 >1500 (H) 07/13/2022    DM screen Lab Results  Component Value Date   HGBA1C 5.2 07/13/2022   Not bad for chronic prednisone treatment for joint dz  Also takes  enbrel A lot of chronic pain issues   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   Bilateral leg weakness 10/09/2021   Vitamin B12 deficiency 08/02/2021   Myofascial pain dysfunction  syndrome 08/02/2021   Adverse effect of prednisone 07/11/2021   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   History of pulmonary embolism 12/08/2020   Arthritis of knee 12/06/2020   Bilateral knee pain 09/20/2020   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   High risk medication use 09/20/2020   Extensor tenosynovitis of right wrist 09/15/2020   S/P reverse total shoulder arthroplasty, 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   Lung nodule 01/02/2020   Viral URI with cough 03/13/2019   Status post arthroscopy of left shoulder 01/16/2018   Estrogen deficiency 06/28/2017   Medial epicondylitis, left elbow 04/29/2017   S/P arthroscopy of left shoulder 02/25/2017   Impingement syndrome of left shoulder 01/30/2017   Pedal edema 10/26/2016   Trochanteric bursitis, right hip 04/10/2016   Hypertension 09/02/2015   Urge incontinence 07/27/2015   Obesity 04/28/2015   Osteoarthritis of left knee 01/28/2015   Status post total left knee replacement 01/28/2015   Vitamin D deficiency 04/27/2014   Seasonal and perennial allergic rhinitis 06/17/2013   S/P total hip arthroplasty 03/20/2013   Medicare annual wellness visit, subsequent 11/18/2012   History of falling 11/18/2012   Routine general medical examination at a health care facility 11/10/2012   Lichen sclerosus et atrophicus of the vulva 07/29/2012   Osteopenia 10/19/2011   Hypothyroidism 10/19/2011   ANEMIA 10/11/2009   Hereditary and idiopathic peripheral neuropathy 08/05/2009   Low back pain 08/05/2009   HAND PAIN, BILATERAL 08/05/2009   TREMOR 03/09/2008    MENOPAUSAL SYNDROME 10/09/2007   DEPRESSION, MAJOR 05/20/2007   BIPOLAR AFFECTIVE DISORDER 05/20/2007   Generalized anxiety disorder 05/20/2007   PERSONALITY DISORDER 05/20/2007   ESOPHAGEAL SPASM 05/20/2007   Diaphragmatic hernia 05/20/2007   AMAUROSIS FUGAX 05/07/2007   COPD mixed type (HCC) 03/27/2007   HYPERCHOLESTEROLEMIA, PURE 12/10/2006   SYMPTOM, SYNDROME, CHRONIC FATIGUE 12/10/2006   Past Medical History:  Diagnosis Date   Allergy Hayfever   1958   Amaurosis fugax    Anemia    hx   Anxiety    Arthritis    Asthma    Cataract    Chronic fatigue syndrome    Chronic kidney disease    frequency, nephritis when 72 yrs old   COPD (chronic obstructive pulmonary disease) (HCC)    COVID-19    Depression    Diverticulitis    Emphysema of lung (HCC)    Fibromyalgia    GERD (gastroesophageal reflux disease)    occ   H/O hiatal hernia    History of bronchitis    Hyperlipidemia    Hypothyroidism    Interstitial cystitis    Irritable bowel syndrome    Lichen sclerosus    Lumbar herniated disc    Migraines    Motor nerve conduction block 11/2021   Pneumonia    PONV (postoperative nausea and vomiting)    Shortness of breath    exertion    Thyroid disease    Graves   Urinary frequency    Urinary tract infection    Vertigo    Past Surgical History:  Procedure Laterality Date   ABDOMINAL HYSTERECTOMY     bladder sugery      CAROTID DOPPLAR     COLONOSCOPY  05/26/2010   avms- otherwise nl , re check 10y   DEXA-OSTEOPENIA     DOPPLER ECHOCARDIOGRAPHY     elbow surgery     EPICONDYLITIS     EYE SURGERY Bilateral    cataracts   FOOT SURGERY Bilateral    I & D EXTREMITY Right 09/01/2020  Procedure: TYNOSYNOVECTOMY;  Surgeon: [name], MD;  Location: [facility] OR;  Service: Orthopedics;  Laterality: Right;   IMPLANTATION VAGAL NERVE STIMULATOR  03/2022   JOINT REPLACEMENT     KNEE SURGERY     Left cartilage   lipoma in second finger right hand      PLANTAR FASCIA SURGERY Left    REVERSE SHOULDER ARTHROPLASTY Left 02/02/2020   Procedure: LEFT REVERSE SHOULDER ARTHROPLASTY;  Surgeon: [name], MD;  Location: [facility] OR;  Service: Orthopedics;  Laterality: Left;   ROTATOR CUFF REPAIR Left 12/2017   TEMPOROMANDIBULAR JOINT SURGERY     TONSILLECTOMY     TOTAL HIP ARTHROPLASTY Right 03/20/2013   Procedure: Right TOTAL HIP ARTHROPLASTY;  Surgeon: [name], MD;  Location: [facility] OR;  Service: Orthopedics;  Laterality: Right;  Right Total Hip Arthroplasty   TOTAL KNEE ARTHROPLASTY Left 01/28/2015   Procedure: LEFT TOTAL KNEE ARTHROPLASTY;  Surgeon: [name], MD;  Location: [facility] ORS;  Service: Orthopedics;  Laterality: Left;   TRIGGER FINGER RELEASE Right    TROCHANTERIC BURSA EXCISION Right 05/03/2016   Dr. [name], [name]   WRIST ARTHROSCOPY Right    ligament tear   Social History   Tobacco Use   Smoking status: Former    Packs/day: 2.00    Years: 43.00    Additional pack years: 0.00    Total pack years: 86.00    Types: Cigarettes    Quit date: 02/26/2013    Years since quitting: 9.3    Passive exposure: Never   Smokeless tobacco: Never   Tobacco comments:    Vaping occasionally  Vaping Use   Vaping Use: Some days   Devices: uses about once per month  Substance Use Topics   Alcohol use: No   Drug use: No   Family History  Problem Relation Age of Onset   Arthritis Mother    Hypertension Mother    Kidney disease Mother    Heart disease Mother    Stroke Mother    Cancer Father        Bladder   Arthritis Sister    Heart disease Sister    Colon cancer Other    Cancer - Lung Cousin    Arthritis Maternal Grandmother    Hearing loss Maternal Grandfather    Varicose Veins Maternal Uncle    Allergies  Allergen Reactions   Lithium Anaphylaxis   Tegretol [Carbamazepine] Other (See Comments)    Fever and body aches (fever over 103)   Tricyclic Antidepressants Anaphylaxis    Other reaction(s):  Unknown   Methotrexate Derivatives Other (See Comments)    Urinary retention and dizziness  Other reaction(s): Other UTI   Strawberry Extract Hives   Tapentadol Rash    PT ALLERGIC NYLON TAPE    Codeine Nausea Only    Makes pt stay awake   Cymbalta [Duloxetine Hcl] Other (See Comments)    Makes pt pass out    Erythromycin     abdominal pain   Lyrica [Pregabalin]     Felt faint   Neurontin [Gabapentin]     Passes  out   Rabeprazole Sodium     insomnia   Baclofen Nausea Only    Sever nausea   Duraprep [Antiseptic Products, Misc.] Itching and Rash    Tolerates Betadine    Penicillins Rash    Tolerated ANCEF 02/02/20  Has patient had a PCN reaction causing immediate rash, facial/tongue/throat swelling, SOB or lightheadedness with hypotension: Yes Has patient had  a PCN reaction causing severe rash involving mucus membranes or skin necrosis: No Has patient had a PCN reaction that required hospitalization No Has patient had a PCN reaction occurring within the last 10 years: No If all of the above answers are "NO", then may proceed with Cephalosporin use.    Tape Rash    PT ALLERGIC NYLON TAPE    Current Outpatient Medications on File Prior to Visit  Medication Sig Dispense Refill   albuterol (VENTOLIN HFA) 108 (90 Base) MCG/ACT inhaler Inhale 1-2 puffs into the lungs every 4 (four) hours as needed for wheezing or shortness of breath. 1 each 0   benzonatate (TESSALON) 200 MG capsule Take 1 capsule (200 mg total) by mouth 3 (three) times daily as needed for cough. Swallow whole 30 capsule 1   Black Cohosh 40 MG CAPS Take 1 capsule (40 mg total) by mouth daily. 30 capsule 11   Budeson-Glycopyrrol-Formoterol (BREZTRI AEROSPHERE) 160-9-4.8 MCG/ACT AERO Inhale 2 puffs into the lungs 2 (two) times daily. 3 each 3   buPROPion (WELLBUTRIN XL) 150 MG 24 hr tablet Take 150 mg by mouth daily.     busPIRone (BUSPAR) 15 MG tablet Take 15 mg by mouth 2 (two) times daily before a meal.      cholecalciferol (VITAMIN D) 25 MCG (1000 UNIT) tablet Take 2,000 Units by mouth daily. 2 tablets daily     clobetasol cream (TEMOVATE) 0.05 % APPLY SMALL AMOUNT TO AFFECTED AREAS TWO TO THREE TIMES PER WEEK 30 g 4   cyanocobalamin 2000 MCG tablet Take 500 mcg by mouth daily.     diclofenac Sodium (VOLTAREN) 1 % GEL Apply 2 g topically 2 (two) times daily as needed (pain).     diphenhydrAMINE (BENADRYL) 25 MG tablet Take 25 mg by mouth every 6 (six) hours as needed for allergies.     divalproex (DEPAKOTE ER) 500 MG 24 hr tablet Take 500 mg by mouth at bedtime.      etanercept (ENBREL SURECLICK) 50 MG/ML injection Inject 50 mg into the skin once a week. 12 mL 0   fluticasone (FLONASE) 50 MCG/ACT nasal spray PLACE 2 SPRAYS INTO THE NOSE DAILY. 16 g 11   HYDROcodone-acetaminophen (NORCO/VICODIN) 5-325 MG tablet Take 1 tablet by mouth 3 (three) times daily as needed for moderate pain. 30 tablet 0   ipratropium-albuterol (DUONEB) 0.5-2.5 (3) MG/3ML SOLN Take 3 mLs by nebulization every 6 (six) hours as needed. 120 mL prn   ketotifen (ZADITOR) 0.025 % ophthalmic solution Place 1 drop into both eyes 2 (two) times daily as needed (allergies).     levothyroxine (SYNTHROID) 50 MCG tablet Take 1 tablet (50 mcg total) by mouth daily before breakfast. 90 tablet 1   LORazepam (ATIVAN) 1 MG tablet Take 1 mg by mouth at bedtime as needed for anxiety or sleep.     metoprolol succinate (TOPROL-XL) 25 MG 24 hr tablet TAKE 1/2 TABLET BY MOUTH EVERY MORNING AND EVERY NIGHT AT BEDTIME 90 tablet 1   mirabegron ER (MYRBETRIQ) 25 MG TB24 tablet Take 1 tablet (25 mg total) by mouth in the morning and at bedtime. 180 tablet 3   mirtazapine (REMERON) 30 MG tablet Take 30 mg by mouth at bedtime.      ondansetron (ZOFRAN-ODT) 4 MG disintegrating tablet Take 1 tablet (4 mg total) by mouth every 8 (eight) hours as needed for nausea or vomiting. 20 tablet 1   Oxycodone HCl 10 MG TABS Take 1 tablet by mouth every 4 (four) hours  as  needed.     predniSONE (DELTASONE) 10 MG tablet Take 2 tablets (20 mg total) by mouth daily with breakfast for 14 days, THEN 1.5 tablets (15 mg total) daily with breakfast for 14 days, THEN 1 tablet (10 mg total) daily with breakfast. TAKE ONE TABLET BY MOUTH DAILY AS DIRECTED. 60 tablet 1   Rhubarb (ESTROVEN COMPLETE) 4 MG TABS Take 4 mg by mouth daily. 30 tablet 11   sertraline (ZOLOFT) 100 MG tablet Take 100 mg by mouth daily.      Spacer/Aero-Holding Chambers (AEROCHAMBER PLUS) inhaler Use with inhaler 1 each 2   traMADol (ULTRAM) 50 MG tablet Take 50 mg by mouth every 6 (six) hours as needed.     No current facility-administered medications on file prior to visit.     Review of Systems  Constitutional:  Positive for fatigue and unexpected weight change. Negative for activity change, appetite change and fever.  HENT:  Negative for congestion, ear pain, rhinorrhea, sinus pressure and sore throat.   Eyes:  Negative for pain, redness and visual disturbance.  Respiratory:  Negative for cough, shortness of breath and wheezing.   Cardiovascular:  Negative for chest pain and palpitations.  Gastrointestinal:  Negative for abdominal pain, blood in stool, constipation and diarrhea.  Endocrine: Negative for polydipsia and polyuria.  Genitourinary:  Negative for dysuria, frequency and urgency.  Musculoskeletal:  Positive for arthralgias, back pain, gait problem and myalgias.  Skin:  Negative for pallor and rash.  Allergic/Immunologic: Negative for environmental allergies.  Neurological:  Negative for dizziness, syncope and headaches.  Hematological:  Negative for adenopathy. Does not bruise/bleed easily.  Psychiatric/Behavioral:  Positive for dysphoric mood and sleep disturbance. Negative for decreased concentration. The patient is nervous/anxious.        Objective:   Physical Exam Constitutional:      General: She is not in acute distress.    Appearance: Normal appearance. She is  well-developed. She is obese. She is not ill-appearing or diaphoretic.  HENT:     Head: Normocephalic and atraumatic.     Right Ear: Tympanic membrane, ear canal and external ear normal.     Left Ear: Tympanic membrane, ear canal and external ear normal.     Nose: Nose normal. No congestion.     Mouth/Throat:     Mouth: Mucous membranes are moist.     Pharynx: Oropharynx is clear. No posterior oropharyngeal erythema.  Eyes:     General: No scleral icterus.    Extraocular Movements: Extraocular movements intact.     Conjunctiva/sclera: Conjunctivae normal.     Pupils: Pupils are equal, round, and reactive to light.  Neck:     Thyroid: No thyromegaly.     Vascular: No carotid bruit or JVD.  Cardiovascular:     Rate and Rhythm: Normal rate and regular rhythm.     Pulses: Normal pulses.     Heart sounds: Normal heart sounds.     No gallop.  Pulmonary:     Effort: Pulmonary effort is normal. No respiratory distress.     Breath sounds: Normal breath sounds. No wheezing.     Comments: Good air exch Chest:     Chest wall: No tenderness.  Abdominal:     General: Bowel sounds are normal. There is no distension or abdominal bruit.     Palpations: Abdomen is soft. There is no mass.     Tenderness: There is no abdominal tenderness.     Hernia: No hernia is present.  Genitourinary:    Comments: Breast exam: No mass, nodules, thickening, tenderness, bulging, retraction, inflamation, nipple discharge or skin changes noted.  No axillary or clavicular LA.     Musculoskeletal:        General: No tenderness. Normal range of motion.     Cervical back: Normal range of motion and neck supple. No rigidity. No muscular tenderness.     Right lower leg: No edema.     Left lower leg: No edema.     Comments: No kyphosis   Lymphadenopathy:     Cervical: No cervical adenopathy.  Skin:    General: Skin is warm and dry.     Coloration: Skin is not pale.     Findings: No erythema or rash.     Comments:  Solar lentigines diffusely Fair complexion   Neurological:     Mental Status: She is alert. Mental status is at baseline.     Cranial Nerves: No cranial nerve deficit.     Motor: No abnormal muscle tone.     Coordination: Coordination normal.     Gait: Gait normal.     Deep Tendon Reflexes: Reflexes are normal and symmetric. Reflexes normal.  Psychiatric:        Mood and Affect: Mood normal.        Cognition and Memory: Cognition and memory normal.           Assessment & Plan:   , Problem List Items Addressed This Visit       Cardiovascular and Mediastinum   Hypertension - Primary    bp is stable today  No cp or palpitations or headaches or edema  No side effects to medicines  BP Readings from Last 3 Encounters:  07/16/22 122/65  07/13/22 (!) 160/88  05/30/22 129/75    Continues metoprolol xl 12.5 mg daily      Relevant Medications   simvastatin (ZOCOR) 20 MG tablet     Respiratory   COPD mixed type (HCC)    Continues pulmonary care with Dr Dolores Frame inhaler for mt  Duo neb or albuterol for rescue  Doing well today  Last CT 2021- may discuss lung cancer screening program        Endocrine   Hypothyroidism    Hypothyroidism  Pt has no clinical changes No change in energy level/ hair or skin/ edema and no tremor Lab Results  Component Value Date   TSH 2.76 07/13/2022   Continues levothyroxine 50 mcg daily         Musculoskeletal and Integument   Osteopenia    Dexa 06/2017  One is ordered in light of compression fractures  Takign vit D / level is low normal  Encouraged exercise as tolerated/ will be starting some home PT soon        Genitourinary   Lichen sclerosus et atrophicus of the vulva    Symptoms have been better lately  Overdue for gyn visit  She plans to schedule         Other   ANEMIA    Normal cbc and iron today  Reassuring       DEPRESSION, MAJOR    Continues psychiatric care  Struggles with chronic pain  Sleep is  poor   Recent addn of ativan may add to her fall risk (mirtazapine was no longer working at top dose)       Diabetes mellitus screening    Lab Results  Component Value Date   HGBA1C 5.2 07/13/2022  Lower then expected in light of prednisone disc imp of low glycemic diet and wt loss to prevent DM2       History of falling    ? If recent addn of ativan by psychiatry has added to more recent falls Likely multi factorial       HYPERCHOLESTEROLEMIA, PURE    Disc goals for lipids and reasons to control them Rev last labs with pt Rev low sat fat diet in detail  LDL down to 99 HDl over 50 Plan to continue simvastaatin 20 mg daily      Relevant Medications   simvastatin (ZOCOR) 20 MG tablet   Low back pain    Oingoing  Thoracic comp fracture Sciatica/ deg disk dz  Did not tolerate gabapentin or lyrica in past Will d/w her pain specialist  About to try ketamine treatment       Obesity    Discussed how this problem influences overall health and the risks it imposes  Reviewed plan for weight loss with lower calorie diet (via better food choices and also portion control or program like weight watchers) and exercise building up to or more than 30 minutes 5 days per week including some aerobic activity   If she becomes able to wean prednisone later -that would be helpful      Urge incontinence    Continue myrbetriq  This has been helpful       Vitamin B12 deficiency    Lab Results  Component Value Date   VITAMINB12 >1500 (H) 07/13/2022  Encouraged her to cut dose by half to 500 mcg daily       Vitamin D deficiency    Last vitamin D Lab Results  Component Value Date   VD25OH 37.38 07/13/2022   Encouraged to continue current supplementation

## 2022-07-16 NOTE — Assessment & Plan Note (Signed)
Normal cbc and iron today  Reassuring

## 2022-07-16 NOTE — Patient Instructions (Addendum)
When you see your rheumatolgist -ask what they think about a shingrix vaccine   If you want to get you do it at the pharmacy   Stay on your vitamin D  Do PT as planned   You can cut vitamin B12 down to 500 mcg daily   Make sure you drink water for kidney health   When you go for your infusion ask about any other options for sciatica besides gabapentin or lyrica    Keep talking to your psychiatrist  The ativan may be adding to falls and memory change

## 2022-07-16 NOTE — Assessment & Plan Note (Signed)
?   If recent addn of ativan by psychiatry has added to more recent falls Likely multi factorial

## 2022-07-16 NOTE — Assessment & Plan Note (Signed)
Continues pulmonary care with Dr Dolores Frame inhaler for mt  Duo neb or albuterol for rescue  Doing well today  Last CT 2021- may discuss lung cancer screening program

## 2022-07-16 NOTE — Assessment & Plan Note (Signed)
bp is stable today  No cp or palpitations or headaches or edema  No side effects to medicines  BP Readings from Last 3 Encounters:  07/16/22 122/65  07/13/22 (!) 160/88  05/30/22 129/75    Continues metoprolol xl 12.5 mg daily

## 2022-07-16 NOTE — Assessment & Plan Note (Signed)
Lab Results  Component Value Date   VITAMINB12 >1500 (H) 07/13/2022   Encouraged her to cut dose by half to 500 mcg daily

## 2022-07-16 NOTE — Assessment & Plan Note (Signed)
Hypothyroidism  Pt has no clinical changes No change in energy level/ hair or skin/ edema and no tremor Lab Results  Component Value Date   TSH 2.76 07/13/2022   Continues levothyroxine 50 mcg daily

## 2022-07-16 NOTE — Assessment & Plan Note (Signed)
Symptoms have been better lately  Overdue for gyn visit  She plans to schedule

## 2022-07-16 NOTE — Assessment & Plan Note (Signed)
Discussed how this problem influences overall health and the risks it imposes  Reviewed plan for weight loss with lower calorie diet (via better food choices and also portion control or program like weight watchers) and exercise building up to or more than 30 minutes 5 days per week including some aerobic activity   If she becomes able to wean prednisone later -that would be helpful

## 2022-07-16 NOTE — Assessment & Plan Note (Signed)
Dexa 06/2017  One is ordered in light of compression fractures  Takign vit D / level is low normal  Encouraged exercise as tolerated/ will be starting some home PT soon

## 2022-07-16 NOTE — Assessment & Plan Note (Signed)
Continues psychiatric care  Struggles with chronic pain  Sleep is poor   Recent addn of ativan may add to her fall risk (mirtazapine was no longer working at top dose)

## 2022-07-16 NOTE — Assessment & Plan Note (Signed)
Continue myrbetriq  This has been helpful

## 2022-07-16 NOTE — Telephone Encounter (Signed)
[facility] patient 

## 2022-07-16 NOTE — Assessment & Plan Note (Signed)
Last vitamin D Lab Results  Component Value Date   VD25OH 37.38 07/13/2022    Encouraged to continue current supplementation

## 2022-07-20 ENCOUNTER — Telehealth: Payer: Self-pay | Admitting: Family Medicine

## 2022-07-20 NOTE — Telephone Encounter (Signed)
Message sent

## 2022-07-20 NOTE — Telephone Encounter (Signed)
Marchelle Folks from [facility] called over to report that patient had a fall on Wednesday evening. She stated that she had a skin tear on her R elbow, with no other injuries and vitals were normal. Thank you!

## 2022-07-20 NOTE — Telephone Encounter (Signed)
Thanks for the heads up  Last visit we discussed increase in falls and I encouraged her to discuss with psychiatrist (? If more falls with ativan)   Please send a copy of this phone note to psychiatry so they are aware

## 2022-07-27 ENCOUNTER — Telehealth: Payer: Self-pay

## 2022-07-27 NOTE — Progress Notes (Signed)
Care Management & Coordination Services Pharmacy Team  Reason for Encounter: Appointment Reminder  Contacted patient to confirm telephone appointment with Al Corpus , PharmD on 08/01/22 at 11:00. Spoke with patient on 07/27/2022   Do you have any problems getting your medications? No  Breztri  from manufacturer What is your top health concern you would like to discuss at your upcoming visit?   Continued chronic pain  Have you seen any other providers since your last visit with PCP? Yes- pain management     Hospital visits:  06/26/22 - 06/30/22   [facility] - intractable pain -acute hypoxia,severe DDD,tobacco abuse, discharged to rehab.   Star Rating Drugs:  Medication:  Last Fill: Day Supply Simvastatin 20mg  05/05/22  90   Care Gaps: Annual wellness visit in last year? Yes   Al Corpus, PharmD notified  Burt Knack, [facility] Clinical Pharmacy Assistant [phone]

## 2022-07-30 ENCOUNTER — Other Ambulatory Visit: Payer: Self-pay | Admitting: Family Medicine

## 2022-07-30 ENCOUNTER — Ambulatory Visit
Admission: RE | Admit: 2022-07-30 | Discharge: 2022-07-30 | Disposition: A | Discharge status: Home / Self Care | Payer: Medicare Other | Source: Ambulatory Visit | Attending: Family Medicine | Admitting: Family Medicine

## 2022-07-30 DIAGNOSIS — S22080D Wedge compression fracture of T11-T12 vertebra, subsequent encounter for fracture with routine healing: Secondary | ICD-10-CM

## 2022-07-30 DIAGNOSIS — S22070D Wedge compression fracture of T9-T10 vertebra, subsequent encounter for fracture with routine healing: Secondary | ICD-10-CM

## 2022-07-30 DIAGNOSIS — Z9682 Presence of neurostimulator: Secondary | ICD-10-CM

## 2022-07-30 DIAGNOSIS — R921 Mammographic calcification found on diagnostic imaging of breast: Secondary | ICD-10-CM

## 2022-07-30 DIAGNOSIS — J449 Chronic obstructive pulmonary disease, unspecified: Secondary | ICD-10-CM

## 2022-07-30 DIAGNOSIS — F419 Anxiety disorder, unspecified: Secondary | ICD-10-CM

## 2022-07-30 DIAGNOSIS — F32A Depression, unspecified: Secondary | ICD-10-CM

## 2022-07-30 DIAGNOSIS — G894 Chronic pain syndrome: Secondary | ICD-10-CM

## 2022-08-01 ENCOUNTER — Ambulatory Visit: Payer: Medicare Other | Admitting: Pharmacist

## 2022-08-01 ENCOUNTER — Telehealth: Payer: Self-pay | Admitting: *Deleted

## 2022-08-01 DIAGNOSIS — Z5982 Transportation insecurity: Secondary | ICD-10-CM

## 2022-08-01 NOTE — Telephone Encounter (Signed)
The plan at our last visit was to taper down to 10 mg if tolerating and could stay at that dose until we follow-up.  If she feels very good on 10 mg could try going down to 5 but would not discontinue completely until we have followed up. She is okay to get Shingrix vaccine.

## 2022-08-01 NOTE — Progress Notes (Signed)
Care Management & Coordination Services Pharmacy Note  08/01/2022 Name:  Lori Orozco MRN:  [id number] DOB:  1950-05-24  Summary: F/U visit -Pain mgmt: pt is not taking any pain medication (opiods or tramadol) as she was in violation of her pain contract last month when the hospital prescribed opioids and she took them; she has f/u appt with pain mgmt 6/19, she wants sooner appt but does not have transportation while her friend is on vacation -? Osteoporosis: recent fall with compression fracture, DEXA has been ordered   Recommendations/Changes made from today's visit: -Referred to care guide for assistance with transportation to pain clinic -Advised to schedule DEXA scan  Follow up plan: -Pharmacist follow up televisit scheduled for 1 month -Rheumatology 09/10/22; Pulmonology 12/20/22; PCP 07/11/23    Subjective: Lori Orozco is an 72 y.o. year old female who is a primary patient of Tower, Audrie Gallus, MD.  The care coordination team was consulted for assistance with disease management and care coordination needs.    Engaged with patient by telephone for follow up visit.  Patient Care Team: Tower, Audrie Gallus, MD as PCP - Lorelle Gibbs, MD as Consulting Physician (Ophthalmology) Phillip Heal, MD as Referring Physician (Psychiatry) Allie Bossier, MD as Consulting Physician (Obstetrics and Gynecology) Waymon Budge, MD as Consulting Physician (Pulmonary Disease) [name], MD as Consulting Physician (Orthopedic Surgery) [name], MD as Consulting Physician (Orthopedic Surgery) Tyrell Antonio, MD as Consulting Physician (Physical Medicine and Rehabilitation) Ronalee Belts, MD as Referring Physician (Otolaryngology) Doran Heater, PhD as Referring Physician (Psychology) Kathyrn Sheriff, [facility] as Pharmacist (Pharmacist)  Recent office visits: 07/16/22 Dr Milinda Antis OV: annual - no changes. ? If ativan is contributing to falls.  07/13/22 Dr Milinda Antis  OV: hospital f/u - compression fracture. BP 160/88 (pain). Due for DEXA - ordered.   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   08/16/21 pt message - lyrica causing hand swelling. Ok to stop. 08/02/21 Dr Milinda Antis OV: neuropathy - trial pregabalin 25 mg. Reduce B12 to 1/2 or QOD. 07/11/21 Dr Milinda Antis OV: annual exam - labs stable. No changes.  Recent consult visits: 07/18/22 NP Richardson Dopp (pain med): Recommend d/c hydrocodone, oxycodone. Will refill tramadol when oxy/hydro brought in to waste. ketamine infusion 5/28 - advised to cancel due to risks.  06/22/22 Dr Cherrie Distance (Pain med): Recommend ketamine inufsion in clinic  05/30/22 Dr Dimple Casey (Rheum): RA - d/c sulfasalazine (no benefit); rx prednisone taper 20mg  to 10 mg baseline  04/25/22 NP Richardson Dopp (Pain med): resume tramadol once hydrocodone finished  03/15/22 PA Clark (Ortho): knee injection 02/28/22 Dr Dimple Casey (Rheumatology): f/u - d/c butrans patch, diazepam. Update sulfasalazine Rx to 1000 mg BID - pt has been off med 2 weeks d/t cost.  02/08/22 Dr Richardson Dopp (Pain mgmt): advised not to take Trileptal given hx fevers on Tegretol and currently on depakote. Transitioning to opioids.   01/11/22 Dr Richardson Dopp (Pain mgmt): Stop Butrans. Start Trileptal 150 mg BID.   12/25/21 Dr Richardson Dopp (Pain mgmt): Order CRP, Sed rate. If negative order MRI spine. Increase Butrans to 20 mcg/hr q7 days. Failed tramadol and belbuca.    12/21/21 Dr Dimple Casey (Rheumatology): RA - start sulfasalazine.    12/19/21 Dr Maple Hudson (Pulm): COPD - no changes   10/09/21 Dr Dimple Casey (Rheumatology): RA - failed prednisone taper. Add leflunomide.  07/05/21 Dr Dimple Casey (Rheumatology): RA. Improved with Enbrel. Start tapering prednisone to 10 mg. Consider leflunomide in future  if needed for steroid-sparing.  Hospital visits: 06/26/22 - 06/30/22 Admission ([facility]): acute resp failure - hypoxia related to fall/pain, not COPD.    Objective:  Lab Results  Component Value Date    CREATININE 1.10 07/13/2022   BUN 21 07/13/2022   GFR 50.36 (L) 07/13/2022   EGFR 57 (L) 05/30/2022   GFRNONAA >60 09/06/2020   GFRAA >60 01/29/2015   NA 141 07/13/2022   K 5.0 07/13/2022   CALCIUM 9.6 07/13/2022   CO2 32 07/13/2022   GLUCOSE 91 07/13/2022    Lab Results  Component Value Date/Time   HGBA1C 5.2 07/13/2022 12:23 PM   HGBA1C 5.4 07/11/2021 09:38 AM   GFR 50.36 (L) 07/13/2022 12:23 PM   GFR 50.71 (L) 07/11/2021 09:38 AM    Last diabetic Eye exam: No results found for: "HMDIABEYEEXA"  Last diabetic Foot exam: No results found for: "HMDIABFOOTEX"   Lab Results  Component Value Date   CHOL 177 07/13/2022   HDL 54.90 07/13/2022   LDLCALC 99 07/13/2022   TRIG 117.0 07/13/2022   CHOLHDL 3 07/13/2022       Latest Ref Rng & Units 07/13/2022   12:23 PM 05/30/2022    1:24 PM 03/20/2022   10:40 AM  Hepatic Function  Total Protein 6.0 - 8.3 g/dL 6.4  6.6  6.5   Albumin 3.5 - 5.2 g/dL 3.8   4.1   AST 0 - 37 U/L 18  18  16    ALT 0 - 35 U/L 15  12  8    Alk Phosphatase 39 - 117 U/L 82   61   Total Bilirubin 0.2 - 1.2 mg/dL 0.5  0.6  0.4     Lab Results  Component Value Date/Time   TSH 2.76 07/13/2022 12:23 PM   TSH 3.86 07/11/2021 09:38 AM   FREET4 1.08 11/19/2011 03:23 PM       Latest Ref Rng & Units 07/13/2022   12:23 PM 05/30/2022    1:24 PM 03/20/2022   10:40 AM  CBC  WBC 4.0 - 10.5 K/uL 8.2  8.6  8.4   Hemoglobin 12.0 - 15.0 g/dL 16.1  09.6  04.5   Hematocrit 36.0 - 46.0 % 37.2  37.8  37.3   Platelets 150.0 - 400.0 K/uL 246.0  244  263     Lab Results  Component Value Date/Time   VD25OH 37.38 07/13/2022 12:23 PM   VD25OH 47.24 07/11/2021 09:38 AM   VITAMINB12 >1500 (H) 07/13/2022 12:23 PM   VITAMINB12 >1504 (H) 08/02/2021 02:43 PM    Clinical ASCVD: No  The 10-year ASCVD risk score (Arnett DK, et al., 2019) is: 28.2%   Values used to calculate the score:     Age: 72 years     Sex: Female     Is Non-Hispanic African American: No     Diabetic:  No     Tobacco smoker: Yes     Systolic Blood Pressure: 145 mmHg     Is BP treated: Yes     HDL Cholesterol: 54.9 mg/dL     Total Cholesterol: 177 mg/dL       06/04/8117   14:78 AM 08/02/2021    2:02 PM 07/11/2021    8:50 AM  Depression screen PHQ 2/9  Decreased Interest 3 0 0  Down, Depressed, Hopeless 3 3 0  PHQ - 2 Score 6 3 0  Altered sleeping 2  0  Tired, decreased energy 3  3  Change in appetite 1  3  Feeling bad or failure about yourself  0  0  Trouble concentrating 3  1  Moving slowly or fidgety/restless 3    Suicidal thoughts 0  0  PHQ-9 Score 18  7  Difficult doing work/chores Very difficult       Social History   Tobacco Use  Smoking Status Former   Packs/day: 2.00   Years: 43.00   Additional pack years: 0.00   Total pack years: 86.00   Types: Cigarettes   Quit date: 02/26/2013   Years since quitting: 9.4   Passive exposure: Never  Smokeless Tobacco Never  Tobacco Comments   Vaping occasionally   BP Readings from Last 3 Encounters:  07/16/22 122/65  07/13/22 (!) 160/88  05/30/22 129/75   Pulse Readings from Last 3 Encounters:  07/16/22 92  07/13/22 73  05/30/22 73   Wt Readings from Last 3 Encounters:  07/16/22 220 lb 8 oz (100 kg)  07/13/22 227 lb (103 kg)  05/30/22 218 lb (98.9 kg)   BMI Readings from Last 3 Encounters:  07/16/22 34.54 kg/m  07/13/22 35.55 kg/m  05/30/22 34.14 kg/m    Allergies  Allergen Reactions   Lithium Anaphylaxis   Tegretol [Carbamazepine] Other (See Comments)    Fever and body aches (fever over 103)   Tricyclic Antidepressants Anaphylaxis    Other reaction(s): Unknown   Methotrexate Derivatives Other (See Comments)    Urinary retention and dizziness  Other reaction(s): Other UTI   Strawberry Extract Hives   Tapentadol Rash    PT ALLERGIC NYLON TAPE    Codeine Nausea Only    Makes pt stay awake   Cymbalta [Duloxetine Hcl] Other (See Comments)    Makes pt pass out    Erythromycin     abdominal pain    Lyrica [Pregabalin]     Felt faint   Neurontin [Gabapentin]     Passes  out   Rabeprazole Sodium     insomnia   Baclofen Nausea Only    Sever nausea   Duraprep [Antiseptic Products, Misc.] Itching and Rash    Tolerates Betadine    Penicillins Rash    Tolerated ANCEF 02/02/20  Has patient had a PCN reaction causing immediate rash, facial/tongue/throat swelling, SOB or lightheadedness with hypotension: Yes Has patient had a PCN reaction causing severe rash involving mucus membranes or skin necrosis: No Has patient had a PCN reaction that required hospitalization No Has patient had a PCN reaction occurring within the last 10 years: No If all of the above answers are "NO", then may proceed with Cephalosporin use.    Tape Rash    PT ALLERGIC NYLON TAPE     Medications Reviewed Today     Reviewed by Kathyrn Sheriff, [facility] (Pharmacist) on 08/01/22 at 1118  Med List Status: <None>   Medication Order Taking? Sig Documenting Provider Last Dose Status Informant  albuterol (VENTOLIN HFA) 108 (90 Base) MCG/ACT inhaler [id number] Yes Inhale 1-2 puffs into the lungs every 4 (four) hours as needed for wheezing or shortness of breath. Domenick Gong, MD Taking Active   Black Cohosh 40 MG CAPS [id number] Yes Take 1 capsule (40 mg total) by mouth daily. Constant, Peggy, MD Taking Active   Budeson-Glycopyrrol-Formoterol (BREZTRI AEROSPHERE) 160-9-4.8 MCG/ACT AERO [id number] Yes Inhale 2 puffs into the lungs 2 (two) times daily. Tower, Audrie Gallus, MD Taking Active   buPROPion (WELLBUTRIN XL) 150 MG 24 hr tablet [id number] Yes Take 150 mg by mouth daily. [provider] Taking Active Self  busPIRone (BUSPAR) 15 MG tablet [id number] Yes Take 15 mg by mouth 2 (two) times daily before a meal. [provider] Taking Active Self  cholecalciferol (VITAMIN D) 25 MCG (1000 UNIT) tablet [id number] Yes Take 2,000 Units by mouth daily. 2 tablets daily [provider] Taking Active Self   clobetasol cream (TEMOVATE) 0.05 % [id number] Yes APPLY SMALL AMOUNT TO AFFECTED AREAS TWO TO THREE TIMES PER WEEK Anyanwu, Jethro Bastos, MD Taking Active   cyanocobalamin 2000 MCG tablet [id number] Yes Take 500 mcg by mouth daily. [provider] Taking Active Self  diclofenac Sodium (VOLTAREN) 1 % GEL [id number] Yes Apply 2 g topically 2 (two) times daily as needed (pain). Lynn Ito, MD Taking Active   diphenhydrAMINE (BENADRYL) 25 MG tablet [id number] Yes Take 25 mg by mouth every 6 (six) hours as needed for allergies. [provider] Taking Active Self  divalproex (DEPAKOTE ER) 500 MG 24 hr tablet [id number] Yes Take 500 mg by mouth at bedtime.  [provider] Taking Active Self  etanercept (ENBREL SURECLICK) 50 MG/ML injection [id number] Yes Inject 50 mg into the skin once a week. Fuller Plan, MD Taking Active   fluticasone [facility]) 50 MCG/ACT nasal spray [id number] Yes PLACE 2 SPRAYS INTO THE NOSE DAILY. Jetty Duhamel D, MD Taking Active   ipratropium-albuterol (DUONEB) 0.5-2.5 (3) MG/3ML SOLN [id number] Yes Take 3 mLs by nebulization every 6 (six) hours as needed. Waymon Budge, MD Taking Active            Med Note Cyndie Chime, [facility]   Sun Aug 28, 2020 10:00 PM)    ketotifen (ZADITOR) 0.025 % ophthalmic solution [id number] Yes Place 1 drop into both eyes 2 (two) times daily as needed (allergies). [provider] Taking Active Self  levothyroxine (SYNTHROID) 50 MCG tablet [id number] Yes Take 1 tablet (50 mcg total) by mouth daily before breakfast. Tower, Audrie Gallus, MD Taking Active   LORazepam (ATIVAN) 1 MG tablet [id number] Yes Take 1 mg by mouth at bedtime as needed for anxiety or sleep. [provider] Taking Active Self  metoprolol succinate (TOPROL-XL) 25 MG 24 hr tablet [id number] Yes TAKE 1/2 TABLET BY MOUTH EVERY MORNING AND EVERY NIGHT AT BEDTIME Tower, Audrie Gallus, MD Taking Active   mirabegron ER (MYRBETRIQ) 25 MG TB24 tablet [id number] Yes  Take 1 tablet (25 mg total) by mouth in the morning and at bedtime. Tower, Audrie Gallus, MD Taking Active   mirtazapine (REMERON) 30 MG tablet [id number] Yes Take 30 mg by mouth at bedtime.  [provider] Taking Active Self           Med Note Para March, Dwana Curd   Fri Apr 04, 2018  8:11 AM)    ondansetron (ZOFRAN-ODT) 4 MG disintegrating tablet [id number] Yes Take 1 tablet (4 mg total) by mouth every 8 (eight) hours as needed for nausea or vomiting. Tower, Audrie Gallus, MD Taking Active   predniSONE (DELTASONE) 10 MG tablet [id number] Yes Take 2 tablets (20 mg total) by mouth daily with breakfast for 14 days, THEN 1.5 tablets (15 mg total) daily with breakfast for 14 days, THEN 1 tablet (10 mg total) daily with breakfast. TAKE ONE TABLET BY MOUTH DAILY AS DIRECTED. Fuller Plan, MD Taking Active   Rhubarb (ESTROVEN COMPLETE) 4 MG TABS [id number] Yes Take 4 mg by mouth daily. Constant, Peggy, MD Taking Active   sertraline (ZOLOFT) 100 MG tablet [id number] Yes Take 100 mg by mouth daily.  [provider] Taking Active  Self           Med Note Para March, Dwana Curd   Fri Apr 04, 2018  8:12 AM)    simvastatin (ZOCOR) 20 MG tablet [id number] Yes Take 1 tablet (20 mg total) by mouth at bedtime. 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   Bilateral leg weakness 10/09/2021   Vitamin B12 deficiency 08/02/2021   Myofascial pain dysfunction syndrome 08/02/2021   Adverse effect of prednisone 07/11/2021   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   History of pulmonary embolism 12/08/2020   Arthritis of knee 12/06/2020    Bilateral knee pain 09/20/2020   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   High risk medication use 09/20/2020   Extensor tenosynovitis of right wrist 09/15/2020   S/P reverse total shoulder arthroplasty, 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   Lung nodule 01/02/2020   Viral URI with cough 03/13/2019   Status post arthroscopy of left shoulder 01/16/2018   Estrogen deficiency 06/28/2017   Medial epicondylitis, left elbow 04/29/2017   S/P arthroscopy of left shoulder 02/25/2017   Impingement syndrome of left shoulder 01/30/2017   Pedal edema 10/26/2016   Trochanteric bursitis, right hip 04/10/2016   Hypertension 09/02/2015   Urge incontinence 07/27/2015   Obesity 04/28/2015   Osteoarthritis of left knee 01/28/2015   Status post total left knee replacement 01/28/2015   Vitamin D deficiency 04/27/2014   Seasonal and perennial allergic rhinitis 06/17/2013   S/P total hip arthroplasty 03/20/2013   Medicare annual wellness visit, subsequent 11/18/2012   History of falling 11/18/2012   Routine general medical examination at a health care facility 11/10/2012   Lichen sclerosus et atrophicus of the vulva 07/29/2012   Osteopenia 10/19/2011   Hypothyroidism 10/19/2011   ANEMIA 10/11/2009   Hereditary and idiopathic peripheral neuropathy 08/05/2009   Low back pain 08/05/2009   HAND PAIN, BILATERAL 08/05/2009   TREMOR 03/09/2008   MENOPAUSAL SYNDROME 10/09/2007   DEPRESSION, MAJOR 05/20/2007   BIPOLAR AFFECTIVE DISORDER 05/20/2007   Generalized anxiety disorder 05/20/2007   PERSONALITY DISORDER 05/20/2007   ESOPHAGEAL SPASM 05/20/2007   Diaphragmatic hernia 05/20/2007   AMAUROSIS FUGAX 05/07/2007   COPD mixed type (HCC) 03/27/2007   HYPERCHOLESTEROLEMIA, PURE 12/10/2006   SYMPTOM, SYNDROME, CHRONIC FATIGUE 12/10/2006    Immunization History  Administered Date(s) Administered   Influenza Split 11/19/2011, 11/02/2013   Influenza Whole 12/20/2006, 11/25/2008, 11/26/2009, 11/24/2010   Influenza,  High Dose Seasonal PF 12/13/2016   Influenza,inj,Quad PF,6+ Mos 12/06/2015, 11/28/2021   Influenza-Unspecified 12/08/2012, 12/03/2014, 12/08/2017, 11/10/2018, 10/31/2019   Moderna Sars-Covid-2 Vaccination 06/21/2019, 07/19/2019, 01/22/2020   PFIZER(Purple Top)SARS-COV-2 Vaccination 06/04/2020   Pfizer Covid-19 Vaccine Bivalent Booster 64yrs & up 12/29/2021   Pneumococcal Conjugate-13 12/06/2015   Pneumococcal Polysaccharide-23 01/26/2002, 03/23/2008, 09/07/2010, 06/28/2017   Respiratory Syncytial Virus Vaccine,Recomb Aduvanted(Arexvy) 12/29/2021   Td 06/26/2000, 10/19/2011, 02/14/2022   Zoster Recombinat (Shingrix) 09/16/2017    SDOH:  (Social Determinants of Health) assessments and interventions performed: Yes SDOH Interventions    Flowsheet Row Office Visit from 07/13/2022 in [facility] Coordination from 03/02/2022 in [facility] Clinical Support from 07/10/2021 in [facility] Chronic Care Management from 05/15/2021 in [facility] Clinical Support from 06/30/2018 in [facility] Clinical Support from 05/09/2016 in [facility] at [place]  SDOH Interventions        Food Insecurity Interventions -- -- Intervention Not Indicated -- -- --  Housing Interventions -- -- Intervention Not Indicated Intervention Not Indicated -- --  Transportation Interventions -- -- Intervention Not Indicated -- -- --  Depression Interventions/Treatment  Currently on Treatment -- Medication, Currently on Treatment -- PHQ2-9 Score <4 Follow-up Not Indicated Currently on Treatment, Counseling, Medication  [per pt, she is under care of both psychiatrist and psychologist]  Financial Strain Interventions -- Other (Comment)  [applied to LIS/extra help. Also discussed refilling generics and holding off on refilling Mybetriq] Intervention Not Indicated  Other (Comment)  [Symbicort - enrolled AZ&Me,  provided info for LIS] -- --  Physical Activity Interventions -- -- Patient Refused -- -- --  Stress Interventions -- -- Intervention Not Indicated -- -- --  Social Connections Interventions -- -- Intervention Not Indicated -- -- --      SDOH Screenings   Food Insecurity: Food Insecurity Present (07/11/2022)  Housing: Low Risk  (07/11/2022)  Transportation Needs: No Transportation Needs (07/11/2022)  Alcohol Screen: Low Risk  (07/10/2021)  Depression (PHQ2-9): High Risk (07/13/2022)  Financial Resource Strain: Medium Risk (07/11/2022)  Physical Activity: Unknown (07/11/2022)  Social Connections: Unknown (07/11/2022)  Stress: Stress Concern Present (07/11/2022)  Tobacco Use: Medium Risk (07/16/2022)    Medication Assistance:  Markus Daft - AZ&Me approved 2024 LIS approved 2024 - max $11 for brands  Medication Access: Within the past 30 days, how often has patient missed a dose of medication? 0 Is a pillbox or other method used to improve adherence? Yes  Factors that may affect medication adherence? financial need Are meds synced by current pharmacy? No  Are meds delivered by current pharmacy? No  Does patient experience delays in picking up medications due to transportation concerns? No   [facility] Reviewed: Is patient disadvantaged to use [facility]?: Yes  Current Rx insurance plan: [facility] PDP Name and location of Current pharmacy:  [id number] - [place], [place] - [address] [place] [address] Phone: [phone] Fax: [phone]  [facility] services reviewed with patient today?: No  Patient requests to transfer care to [facility]?: No  Reason patient declined to change pharmacies: Disadvantaged due to insurance/mail order  Compliance/Adherence/Medication fill history: Care Gaps: Lung cancer screening (12/14/20)  Star-Rating Drugs: Simvastatin - PDC  100%   Assessment/Plan  Chronic pain / RA (Goal: manage pain) -Not ideally controlled -pt reports she recently had to stop opioids d/t breach of pain contract (hospital prescribed oxycodone and she took it); she is in a lot of pain and next appt with pain mgmt is 6/19, she can't be seen earlier due to transportation issues -pt reports Enbrel is a "miracle drug" for her, pain is much improved and she is in process of tapering off prednisone -RA: follows with Dr Dimple Casey (Rheumatology) -Degenerative disc disease, lumbar radiculopathy, RA; query neuropathy - pt has not tolerated any neuralgia drugs -Established with Dr Richardson Dopp, [facility] -Current treatment  Enbrel 50 mg weekly -Appropriate, Effective, Safe, Accessible Prednisone 10 mg daily - Appropriate, Effective, Query Safe Tramadol 50 mg q6h PRN -not taking Voltaren gel - Appropriate, Effective, Safe, Accessible -Medications previously tried: plaquenil,  methotrexate, leflunomide, tegretol, gabapentin, pregabalin, duloxetine, trileptal, butrans, belbuca, tramadol, turmeric (GI) -Referred to care guide for transportation issues; advised to call pain mgmt to request earlier appt -Recommended to continue current medication  Hypertension / CKD Stage 3a (BP goal <130/80) -Not ideally controlled - BP above goal in recent OV in s/o increased pain, home BP is improved -Current home BP readings: SBP 139 -Denies hypotensive/hypertensive symptoms -Current treatment: Metoprolol succinate 25 mg - 1/2 tab BID - Appropriate, Effective, Safe, Accessible -Medications previously tried: amlodipine, furosemide  -Current dietary habits: not discussed -Current exercise habits: limited -Educated on BP goals and benefits of medications for prevention of heart attack, stroke and kidney damage; Importance of home blood pressure monitoring; Symptoms of hypotension and importance of maintaining adequate hydration; -Counseled to monitor BP at home  daily -Recommended to continue current medication  Osteoporosis screening (Goal prevent fractures) -Uncontrolled - pt had fall and compression fracture 05/2022; due for DEXA. On chronic prednisone and high fall risk -Last DEXA Scan: 06/2017   T-Score femoral neck: -0.4  T-Score lumbar spine: +0.4  10-year probability of major osteoporotic fracture: n/a  10-year probability of hip fracture: n/a -Patient is not a candidate for pharmacologic treatment -Current treatment  Vitamin D 1000 IU - 2 daily - Appropriate, Effective, Safe, Accessible -Medications previously tried: n/a  -Recommend weight-bearing and muscle strengthening exercises for building and maintaining bone density. -Recommend Repeat DEXA when able - ordered per PCP 06/2022  Hyperlipidemia: (LDL goal < 100) -Not ideally controlled - LDL 123 (06/2021), which is the highest it has been in over a decade; pt endorses compliance with statin -Current treatment: Simvastatin 20 mg daily HS - Appropriate, Effective, Safe, Accessible -Medications previously tried: n/a  -Educated on Cholesterol goals; Benefits of statin for ASCVD risk reduction; -Consider optimizing statin therapy at follow up  COPD (Goal: control symptoms and prevent exacerbations) -Controlled - pt reports good control with Breztri -Gold Grade: unable to assess -Current COPD Classification:  A (low sx, <2 exacerbations/yr) -Pulmonary function testing: 10/27/10- FEV1 1.54, FEV1/FVC 0.63, FEF 25-75% 0.84 -Exacerbations requiring treatment in last 6 months: 1 -Current treatment  Albuterol HFA -Appropriate, Effective, Safe, Accessible Breztri 2 puff BID (PAP) -Appropriate, Effective, Safe, Accessible Duoneb PRN -Appropriate, Effective, Safe, Accessible -Medications previously tried: n/a  -Patient denies consistent use of maintenance inhaler -Frequency of rescue inhaler use: 1-2x daily -Counseled on Proper inhaler technique; Benefits of consistent maintenance inhaler  use -Recommend to continue current medication  Bipolar disorder / Depression / Anxiety (Goal: manage symptoms) -Controlled - per patient report; she reports every time they try to change her meds "something goes wrong" and they change it back -PHQ9: 0 (06/2020) - minimal depression -GAD7: not on file -Connected with Dr Phillip Heal for mental health support -Current treatment: Bupropion XL 150 mg daily -Appropriate, Effective, Safe, Accessible Buspirone 15 mg BID -Appropriate, Effective, Safe, Accessible Mirtazapine 30 mg HS -Appropriate, Effective, Safe, Accessible Sertraline 100 mg daily -Appropriate, Effective, Safe, Accessible Divalproex ER 500 mg daily HS -Appropriate, Effective, Safe, Accessible Lorazepam 1 mg HS prn -Appropriate, Effective, Safe, Accessible -Medications previously tried/failed: n/a -Educated on Benefits of medication for symptom control -Recommended to continue current medication  Health Maintenance -Vaccine gaps: Shingrix #2 -Hx PE 11/2020, unable to tolerate AC due to AVM, HGB drop requiring transfusion -Post-menopausal sx: takes black cohosh, rhubarb/estroven; stopped Premarin previously due to VTE risk -OAB: on Mybretriq 25 mg BID - Appropriate, Effective, Safe, Accessibl   Al Corpus, PharmD, [facility] Clinical Pharmacist [facility] [phone]

## 2022-08-01 NOTE — Telephone Encounter (Signed)
Patient advised The plan at our last visit was to taper down to 10 mg if tolerating and could stay at that dose until we follow-up.  If she feels very good on 10 mg could try going down to 5 but would not discontinue completely until we have followed up. She is okay to get Shingrix vaccine. Patient verbalized understanding.

## 2022-08-01 NOTE — Telephone Encounter (Signed)
Patient contacted the office to inquire what dose of Prednisone you would like for her to be taking. Patient states she has been on 10 mg of Prednisone for the entire month of May. She would like to know if you would like her to continue 10 mg or to taper her dose and if so to what. Patient states her PCP would like to know if it is okay for her to receive the Shingrix vaccine.

## 2022-08-02 ENCOUNTER — Telehealth: Payer: Self-pay | Admitting: *Deleted

## 2022-08-02 NOTE — Telephone Encounter (Signed)
   Telephone encounter was:  Successful.  08/02/2022 Name: Lori Orozco MRN: [id number] DOB: 08/03/1950  Lori Orozco is a 72 y.o. year old female who is a primary care patient of Tower, Audrie Gallus, MD . The community resource team was consulted for assistance with Transportation Needs  patient will call to see what is available there is not a community option readily available she lives in [place] and the majority of her Drs are [facility] so this will be an on going need she also has food insecurity it was red on the  wheel and she confirmed it    Care guide performed the following interventions: Patient provided with information about care guide support team and interviewed to confirm resource needs.  Follow Up Plan:  Care guide will follow up with patient by phone over the next week  Tyshawn Ciullo Greenauer -[facility], Population Health [phone] [address] [place] , [place] [address] Email : [email or web address]

## 2022-08-03 ENCOUNTER — Encounter: Payer: Self-pay | Admitting: Family Medicine

## 2022-08-06 ENCOUNTER — Telehealth: Payer: Self-pay | Admitting: *Deleted

## 2022-08-06 NOTE — Telephone Encounter (Signed)
   Telephone encounter was:  Unsuccessful.  08/06/2022 Name: Lori Orozco MRN: [id number] DOB: 1950/11/02  Unsuccessful outbound call made today to assist with:  Transportation Needs   Outreach Attempt:  1st Attempt  A HIPAA compliant voice message was left requesting a return call.  Instructed patient to call back at [phone]. Patient has not returned a call for transportation request  Alois Cliche -[facility] [place], Population Health [phone] [address] [place] , [place] [address] Email : [email or web address]

## 2022-08-07 ENCOUNTER — Telehealth: Payer: Self-pay | Admitting: *Deleted

## 2022-08-07 NOTE — Telephone Encounter (Signed)
   Telephone encounter was:  Successful.  08/07/2022 Name: Lori Orozco MRN: [id number] DOB: 1951/01/11  Lori Orozco is a 72 y.o. year old female who is a primary care patient of Tower, Audrie Gallus, MD . The community resource team was consulted for assistance with Transportation Needs   Care guide performed the following interventions: Patient provided with information about care guide support team and interviewed to confirm resource needs.  Follow Up Plan:  Care guide will follow up with patient by phone over the next day  Enzio Buchler Greenauer -[facility], Population Health [phone] [address] [place] , [place] [address] Email : [email or web address]

## 2022-08-07 NOTE — Telephone Encounter (Signed)
Telephone encounter was:  Successful.  08/07/2022 Name: Lori Orozco MRN: [id number] DOB: 06/25/1950  Lori Orozco is a 72 y.o. year old female who is a primary care patient of Tower, Audrie Gallus, MD . The community resource team was consulted for assistance with Transportation Needs   Care guide performed the following interventions: Follow up call placed to the patient to discuss status of referral. Lori Orozco (Not Benedetto Goad Or [employer])  08/10/22 1:45 PM (EDT) Appointment 02:45 PM (EDT) Unconfirmed  [phone] Provider [facility]  [address] [place], [place] [address], [place] [address] [place], [place] [address], [place] DOB: 01/12/1951 Ride ID: [id number] Follow Up Plan:  No further follow up planned at this time. The patient has been provided with needed resources.  Lori Orozco -[facility] [place], Population Health [phone] [address] [place] , [place] [address] Email : [email or web address]       Lori Orozco DOB: May 01, 1950 MRN: [id number]   RIDER WAIVER AND RELEASE OF LIABILITY  For purposes of improving physical access to our facilities, [facility] is pleased to partner with third parties to provide [facility] patients or other authorized individuals the option of convenient, on-demand ground transportation services (the Chiropractor") through use of the technology service that enables users to request on-demand ground transportation from independent third-party providers.  By opting to use and accept these [employer], I, the undersigned, hereby agree on behalf of myself, and on behalf of any minor child using the Science writer for whom I am the parent or legal guardian, as follows:  Science writer provided to me are provided by independent third-party transportation providers who are not [employer] or employees and who are unaffiliated with [employer]. [employer] is neither a transportation carrier nor a common or public carrier. Lori Orozco has no control over the quality or safety of the transportation that occurs as a result of the [employer]. [facility] cannot guarantee that any third-party transportation provider will complete any arranged transportation service. [facility] makes no representation, warranty, or guarantee regarding the reliability, timeliness, quality, safety, suitability, or availability of any of the Transport Services or that they will be error free. I fully understand that traveling by vehicle involves risks and dangers of serious bodily injury, including permanent disability, paralysis, and death. I agree, on behalf of myself and on behalf of any minor child using the Transport Services for whom I am the parent or legal guardian, that the entire risk arising out of my use of the [employer] remains solely with me, to the maximum extent permitted under applicable law. The [employer] are provided "as is" and "as available." [facility] disclaims all representations and warranties, express, implied or statutory, not expressly set out in these terms, including the implied warranties of merchantability and fitness for a particular purpose. I hereby waive and release [facility], its agents, employees, officers, directors, representatives, insurers, attorneys, assigns, successors, subsidiaries, and affiliates from any and all past, present, or future claims, demands, liabilities, actions, causes of action, or suits of any kind directly or indirectly arising from acceptance and use of the [employer]. I further waive and release [place] and its affiliates from all present and future liability and responsibility for any injury or death to persons or damages to property caused by or related to the use of the [employer]. I have read this Waiver and Release of Liability, and  I understand  the terms used in it and their legal significance. This Waiver is freely and voluntarily given with the understanding that my right (as well as the right of any minor child for whom I am the parent or legal guardian using the [employer]) to legal recourse against  in connection with the [employer] is knowingly surrendered in return for use of these services.   I attest that I read the consent document to Lori Orozco, gave Lori Orozco the opportunity to ask questions and answered the questions asked (if any). I affirm that Lori Orozco then provided consent for she's participation in this program.     Lori Orozco

## 2022-08-08 ENCOUNTER — Ambulatory Visit (INDEPENDENT_AMBULATORY_CARE_PROVIDER_SITE_OTHER): Payer: Medicare Other

## 2022-08-08 ENCOUNTER — Telehealth: Payer: Self-pay | Admitting: Family Medicine

## 2022-08-08 DIAGNOSIS — E2839 Other primary ovarian failure: Secondary | ICD-10-CM

## 2022-08-08 NOTE — Telephone Encounter (Signed)
Please ok those verbal orders  

## 2022-08-08 NOTE — Telephone Encounter (Signed)
Left VM giving VO ?

## 2022-08-08 NOTE — Telephone Encounter (Signed)
Home Health verbal orders Caller Name:Anika Agency Name: [facility]  Christine number: [phone]  Requesting OT/PT/Skilled nursing/Social Work/Speech: PT  Reason:Balance   Frequency:1wk4  Please forward to [facility] pool or providers CMA

## 2022-08-09 ENCOUNTER — Telehealth: Payer: Medicare Other

## 2022-08-10 ENCOUNTER — Other Ambulatory Visit: Payer: Self-pay | Admitting: Orthopaedic Surgery

## 2022-08-10 ENCOUNTER — Ambulatory Visit (INDEPENDENT_AMBULATORY_CARE_PROVIDER_SITE_OTHER): Payer: Medicare Other | Admitting: Sports Medicine

## 2022-08-10 ENCOUNTER — Telehealth: Payer: Self-pay | Admitting: Orthopaedic Surgery

## 2022-08-10 ENCOUNTER — Encounter: Payer: Self-pay | Admitting: Sports Medicine

## 2022-08-10 ENCOUNTER — Other Ambulatory Visit: Payer: Self-pay

## 2022-08-10 DIAGNOSIS — G5701 Lesion of sciatic nerve, right lower limb: Secondary | ICD-10-CM | POA: Diagnosis not present

## 2022-08-10 DIAGNOSIS — M25551 Pain in right hip: Secondary | ICD-10-CM | POA: Diagnosis not present

## 2022-08-10 DIAGNOSIS — M5441 Lumbago with sciatica, right side: Secondary | ICD-10-CM

## 2022-08-10 DIAGNOSIS — S22070S Wedge compression fracture of T9-T10 vertebra, sequela: Secondary | ICD-10-CM

## 2022-08-10 DIAGNOSIS — M5442 Lumbago with sciatica, left side: Secondary | ICD-10-CM | POA: Diagnosis not present

## 2022-08-10 DIAGNOSIS — W19XXXS Unspecified fall, sequela: Secondary | ICD-10-CM | POA: Diagnosis not present

## 2022-08-10 DIAGNOSIS — G8929 Other chronic pain: Secondary | ICD-10-CM

## 2022-08-10 DIAGNOSIS — S22080S Wedge compression fracture of T11-T12 vertebra, sequela: Secondary | ICD-10-CM

## 2022-08-10 DIAGNOSIS — Z96641 Presence of right artificial hip joint: Secondary | ICD-10-CM

## 2022-08-10 MED ORDER — HYDROCODONE-ACETAMINOPHEN 5-325 MG PO TABS: 1.0000 | ORAL_TABLET | Freq: Three times a day (TID) | ORAL | 0 refills | Status: DC | PRN

## 2022-08-10 NOTE — Progress Notes (Signed)
Lori Orozco - 72 y.o. female MRN [id number]  Date of birth: Jul 03, 1950  Office Visit Note: Visit Date: 08/10/2022 PCP: Lori Pimple, MD Referred by: Tower, Lori Gallus, MD  Subjective: Chief Complaint  Patient presents with   Right Hip - Pain   Right Leg - Pain   HPI: Lori Orozco is a pleasant 72 y.o. female who presents today for chronic right buttock pain, low back pain and R > L leg pain.  Lori Orozco is a long-time patient of the practice, but usually sees Lori Orozco and [facility]. She has been through some health issues recently most notably a fall that she states almost left her paralyzed. She was seen for this at [facility] - chart reviewed today independently with visit and imaging from 06/26/22 at [facility].  She tells me today that since her fall, she has had continued pain in the buttock on the right side right where she landed from the fall.  This is a chronic achy pain which can get sharp.  She also has pain that runs down the leg and over the lateral calf on the right.  Sometimes feels as on the left.  Notable Orthopedic History: - R-hip THA in 2015 by Dr. Lajoyce Orozco - Had a fall at the end of April and was seen at [facility] with multiple x-rays and a thoracic spine MRI which showed:  Chronic greater than 75% compression fracture at T12, with 5 mm of superior endplate retropulsion. Chronic approximately 25% T9 superior endplate fracture with less than 2 mm of superior endplate retropulsion.  Pertinent ROS were reviewed with the patient and found to be negative unless otherwise specified above in HPI.   Assessment & Plan: Visit Diagnoses:  1. Pain in right hip   2. Piriformis syndrome of right side   3. Fall, sequela   4. Chronic bilateral low back pain with bilateral sciatica   5. History of total right hip arthroplasty    Plan: Discussed with [facility] treatment options for her posterior hip pain which seems most indicative of piriformis syndrome.  She does  have some intermittent radicular pain going down the right leg mostly, sometimes the left leg.  This has been chronic in nature.  She has taken quite a bit of oral prednisone for various orthopedic conditions and rheumatologic conditions in the past and would like to avoid this if possible.  Her main concern today is the pain in the buttock since her fall.  Through shared decision making did proceed with ultrasound-guided piriformis injection.  She has chronically been taking hydrocodone-acetaminophen, [facility] did refill this for her, she may continue Norco 5-325mg  TID as needed.  Lori Orozco does have a history of chronic lower thoracic compression fractures, previous review of her MRI spine shows rather degenerative disc disease of the lower spine as well.  I did discuss that if it is possible that some of the pain going down the thigh and leg could be coming from the back as well.  If she does not get at least 50% relief from the piriformis injection, it may be wise for her to follow-up with our spine physician, Dr. Christell Orozco given the complexity of her back.  She is agreeable and understanding.  Otherwise may follow-up with [facility] and team for her routine care, I am happy to see her as well as needed.  Follow-up: Return for Will update me in next 1-2 weeks how injection worked; otherwise f/u with [facility].   Meds &  Orders: No orders of the defined types were placed in this encounter.   Orders Placed This Encounter  Procedures   US Guided Needle Placement - No Linked Charges     Procedures: - US guided right piriformis tendon injection performed today.  Utilizing a curvilinear probe placed in a transverse position with the underlying piriformis tendon visualized.  The overlying area was first cleaned with ChloraPrep as well as multiple alcohol swabs.  3 cc of lidocaine 1% was used first for anesthetic purposes only.  Utilizing an in-plane approach a 22-gauge 3.5 inch needle was directed from a  lateral to medial approach into the tendon sheath of the piriformis.  An injectate of 2: 2: 1 of lidocaine: Bupivacaine, Depo-Medrol was injected successfully under ultrasound guidance into the piriformis tendon.  A Band-Aid was then applied.  Patient tolerated procedure well.     Clinical History: MR SPINE LUMBAR WWO CONTRAST, 02/14/2021 4:06 PM   INDICATION: Myelopathy, acute, lumbar spine \ Of note - there is a MRI L spine in care everywhere to compare \ R53.1 Weakness  COMPARISON: Outside noncontrast MRI lumbar spine 12/09/2020 ([facility]), images not available for comparison.   TECHNIQUE: Multiplanar, multisequence surface-coil magnetic resonance imaging of the lumbar spine was performed before and after intravenous administration of gadolinium-based contrast media. Images were acquired from the levels of the lower thoracic spine to the upper sacral region.   FINDINGS:   . Alignment: Mild broad-based dextrocurvature of the lumbar spine.  . Vertebrae: Vertebral body heights are maintained. No diffuse abnormal marrow signal. Mixed edema and fatty signal at the L4-L5 endplates. Mild left greater than right increased STIR signal within the L1-L4 facets which can be seen with chronic stress due to altered biomechanics or degenerative changes, most notable about the left L3-L4 facet (series 14 image 26) with adjacent soft tissue edema and enhancement.  . Conus medullaris: The conus terminates at the level of L1. Normal signal and contour.  Marland Kitchen Upper sacrum: No focal lesion.   . Degenerative changes:       T12-L1: No significant disc height loss. No significant narrowing of the spinal canal or neural foramina.       L1-L2: Minimal disc height loss desiccation. Minimal left eccentric disc bulge with mild endplate spurring which narrow the left lateral recess without substantial canal or foraminal stenosis.       L2-L3: No significant disc height loss. No significant narrowing of the spinal canal  or neural foramina. Trace bilateral facet joint effusions with mild facet arthropathy.       L3-L4: No significant disc height loss. Broad-based disc bulge and facet hypertrophy contribute to mild bilateral foraminal stenosis. Trace right facet joint effusion. Soft tissue edema surrounding the left facet as described above.       L4-L5: Moderate to advanced disc height loss and desiccation. Minimal broad-based disc bulge with facet arthropathy contributing to mild narrowing of the bilateral lateral recesses with mild bilateral foraminal stenosis.       L5-S1: No significant disc height loss. Mild broad-based disc bulge and minimal marginal osteophyte spurring contribute to mild lateral recess narrowing with mild right greater than left foraminal stenosis.   Additional comments: Marked lower paraspinal musculature and psoas sarcopenia. No paraspinal fluid collections seen.   CONCLUSION:   1.  No abnormal cord signal or high grade canal or foraminal stenosis.  2.  Mild edema changes throughout the left greater than right L1-L4 facets, most notably at the L3-L4 facets with  surrounding soft tissue edema and enhancement. Findings are presumed secondary to inflammation from degenerative changes rather than an infectious etiology. No fluid collection to suggest abscess. If there is ongoing concern for infection in the lumbar spine, short-term interval follow up imaging could reassess.  3.  Mixed fat and edema signal at the L4-L5 endplates likely secondary to degenerative disc disease.  Exam End: 02/14/21  She reports that she quit smoking about 9 years ago. Her smoking use included cigarettes. She has a 86.00 pack-year smoking history. She has never been exposed to tobacco smoke. She has never used smokeless tobacco.  Recent Labs    07/13/22 1223  HGBA1C 5.2    Objective:   Vital Signs: There were no vitals taken for this visit.  Physical Exam  Gen: Well-appearing, in no acute distress;  non-toxic CV:  Well-perfused. Warm.  Resp: Breathing unlabored on room air; no wheezing. Psych: Fluid speech in conversation; appropriate affect; normal thought process   Ortho Exam -Patient ambulates utilizing rolling walker device.  There is limited mobility of the lumbar spine.  There is positive TTP in the mid belly of the piriformis muscle.  There is no true tenderness to palpation over the SI joint. Generalized weakness of bilateral LE's.   Imaging:  *Imaging reports were independently reviewed from [facility] from 06/26/2022 by myself today. See results below.  XR Lumbar Spine 2-3 Views: 06/26/2022  Clinical information: Fall    Comparison: MRI lumbar spine 12/09/2020 Lumbar spine x-rays 11/24/2020   Technique: 3 views lumbar spine   Findings:   No fracture of the lumbar spine. Lumbar spine is well aligned. Multilevel degenerative spurring with disc space narrowing involving L4-5. Compression fracture at T12 with severe vertebral body height loss. Compression fracture is new compared to the prior exam. Thoracic spine stimulator in place.    Impression:   1. Compression fracture at T12 with severe vertebral body height loss, suspected to be acute.   2. No evidence of traumatic injury to lumbar spine.   Electronically Signed by: Sabino Gasser on 06/26/2022 4:16 PM   MRI Spine Thoracic WO contrast 06/27/2022: IMPRESSION: 1. Chronic compression fractures T9 and T12 as above. 2. No significant spinal stenosis or neural foraminal stenosis. 3. Spinal stimulator leads.  Electronically Signed by: Manon Hilding on 06/27/2022 6:09 PM Narrative  MRI THORACIC SPINE WITHOUT CONTRAST  HISTORY: Thoracic compression fractures; determine acuteness; pt has remote to turn stimulator off  COMPARISON:  Bone windows from CT chest of 06/26/2022  TECHNIQUE:   Multiplanar, multisequence non-contrast MRI of the thoracic spine was performed.  Imaging sequences included sagittal and axial  acquisitions.  FINDINGS:  Refer to sagittal images for vertebral level labeling.  Normal anatomic alignment is preserved.   No acute fractures.  Chronic greater than 75% compression fracture at T12, with 5 mm of superior endplate retropulsion.  Chronic approximately 25% T9 superior endplate fracture with less than 2 mm of superior endplate retropulsion.  Multilevel disc space narrowing and disc desiccation. Multilevel mild endplate osteophytes. Marrow signal is unremarkable. Small hemangioma anterior and L1. Cord signal is difficult to assess in the mid to distal thoracic spine due to presence of spinal stimulator leads. Spinal stimulator leads extend to T7-8. Mild associated artifact. Visualized perivertebral soft tissues are unremarkable.    No evidence of disc protrusion. Superior endplate retropulsion at T12 mildly effaces the ventral thecal sac, without significant spinal stenosis.  Remainder of the levels show no significant canal or foraminal stenosis.  XR Pelvis  1-2 views - 06/26/2022 Formatting of this note might be different from the original.  Clinical information: Fall    Comparison: 11/24/2020  Technique: AP pelvis  Findings:  Right total hip arthroplasty hardware in place. Visualized portions of the hardware demonstrate no evidence of hardware failure. No fracture or joint malalignment. Lumbar spine degenerative change.   Impression:  No evidence of traumatic injury to the pelvis.  Electronically Signed by: Sabino Gasser on 06/26/2022 4:17 PM  Past Lori/Family/Surgical/Social History: Medications & Allergies reviewed per EMR, new medications updated. 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   Bilateral leg weakness 10/09/2021   Vitamin B12  deficiency 08/02/2021   Myofascial pain dysfunction syndrome 08/02/2021   Adverse effect of prednisone 07/11/2021   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   History of pulmonary embolism 12/08/2020   Arthritis of knee 12/06/2020   Bilateral knee pain 09/20/2020   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   High risk medication use 09/20/2020   Extensor tenosynovitis of right wrist 09/15/2020   S/P reverse total shoulder arthroplasty, 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   Lung nodule 01/02/2020   Viral URI with cough 03/13/2019   Status post arthroscopy of left shoulder 01/16/2018   Estrogen deficiency 06/28/2017   Medial epicondylitis, left elbow 04/29/2017   S/P arthroscopy of left shoulder 02/25/2017   Impingement syndrome of left shoulder 01/30/2017   Pedal edema 10/26/2016   Trochanteric bursitis, right hip 04/10/2016   Hypertension 09/02/2015   Urge incontinence 07/27/2015   Obesity 04/28/2015   Osteoarthritis of left knee 01/28/2015   Status post total left knee replacement 01/28/2015   Vitamin D deficiency 04/27/2014   Seasonal and perennial allergic rhinitis 06/17/2013   S/P total hip arthroplasty 03/20/2013   Medicare annual wellness visit, subsequent 11/18/2012   History of falling 11/18/2012   Routine general Lori examination at a health care facility 11/10/2012   Lichen sclerosus et atrophicus of the vulva 07/29/2012   Osteopenia 10/19/2011   Hypothyroidism 10/19/2011   ANEMIA 10/11/2009   Hereditary and idiopathic peripheral neuropathy 08/05/2009   Low back pain 08/05/2009   HAND PAIN, BILATERAL 08/05/2009   TREMOR 03/09/2008   MENOPAUSAL SYNDROME 10/09/2007   DEPRESSION, MAJOR 05/20/2007   BIPOLAR AFFECTIVE DISORDER 05/20/2007   Generalized anxiety disorder 05/20/2007   PERSONALITY DISORDER 05/20/2007   ESOPHAGEAL SPASM 05/20/2007   Diaphragmatic hernia 05/20/2007   AMAUROSIS FUGAX 05/07/2007   COPD mixed type (HCC) 03/27/2007   HYPERCHOLESTEROLEMIA, PURE  12/10/2006   SYMPTOM, SYNDROME, CHRONIC FATIGUE 12/10/2006   Past Lori History:  Diagnosis Date   Allergy Hayfever   1958   Amaurosis fugax    Anemia    hx   Anxiety    Arthritis    Asthma    Cataract    Chronic fatigue syndrome    Chronic kidney disease    frequency, nephritis when 72 yrs old   COPD (chronic obstructive pulmonary disease) (HCC)    COVID-19    Depression    Diverticulitis    Emphysema of lung (HCC)    Fibromyalgia    GERD (gastroesophageal reflux disease)    occ   H/O hiatal hernia    History of bronchitis    Hyperlipidemia    Hypothyroidism    Interstitial cystitis    Irritable bowel syndrome    Lichen  sclerosus    Lumbar herniated disc    Migraines    Motor nerve conduction block 11/2021   Pneumonia    PONV (postoperative nausea and vomiting)    Shortness of breath    exertion    Thyroid disease    Graves   Urinary frequency    Urinary tract infection    Vertigo    Family History  Problem Relation Age of Onset   Arthritis Mother    Hypertension Mother    Kidney disease Mother    Heart disease Mother    Stroke Mother    Cancer Father        Bladder   Arthritis Sister    Heart disease Sister    Colon cancer Other    Cancer - Lung Cousin    Arthritis Maternal Grandmother    Hearing loss Maternal Grandfather    Varicose Veins Maternal Uncle    Past Surgical History:  Procedure Laterality Date   ABDOMINAL HYSTERECTOMY     bladder sugery      CAROTID DOPPLAR     COLONOSCOPY  05/26/2010   avms- otherwise nl , re check 10y   DEXA-OSTEOPENIA     DOPPLER ECHOCARDIOGRAPHY     elbow surgery     EPICONDYLITIS     EYE SURGERY Bilateral    cataracts   FOOT SURGERY Bilateral    I & D EXTREMITY Right 09/01/2020   Procedure: TYNOSYNOVECTOMY;  Surgeon: [name], MD;  Location: [facility] OR;  Service: Orthopedics;  Laterality: Right;   IMPLANTATION VAGAL NERVE STIMULATOR  03/2022   JOINT REPLACEMENT     KNEE SURGERY     Left  cartilage   lipoma in second finger right hand     PLANTAR FASCIA SURGERY Left    REVERSE SHOULDER ARTHROPLASTY Left 02/02/2020   Procedure: LEFT REVERSE SHOULDER ARTHROPLASTY;  Surgeon: [name], MD;  Location: [facility];  Service: Orthopedics;  Laterality: Left;   ROTATOR CUFF REPAIR Left 12/2017   TEMPOROMANDIBULAR JOINT SURGERY     TONSILLECTOMY     TOTAL HIP ARTHROPLASTY Right 03/20/2013   Procedure: Right TOTAL HIP ARTHROPLASTY;  Surgeon: [name], MD;  Location: [facility] OR;  Service: Orthopedics;  Laterality: Right;  Right Total Hip Arthroplasty   TOTAL KNEE ARTHROPLASTY Left 01/28/2015   Procedure: LEFT TOTAL KNEE ARTHROPLASTY;  Surgeon: [name], MD;  Location: [facility];  Service: Orthopedics;  Laterality: Left;   TRIGGER FINGER RELEASE Right    TROCHANTERIC BURSA EXCISION Right 05/03/2016   Dr. Doneen Poisson   WRIST ARTHROSCOPY Right    ligament tear   Social History   Occupational History   Not on file  Tobacco Use   Smoking status: Former    Packs/day: 2.00    Years: 43.00    Additional pack years: 0.00    Total pack years: 86.00    Types: Cigarettes    Quit date: 02/26/2013    Years since quitting: 9.4    Passive exposure: Never   Smokeless tobacco: Never   Tobacco comments:    Vaping occasionally  Vaping Use   Vaping Use: Some days   Devices: uses about once per month  Substance and Sexual Activity   Alcohol use: No   Drug use: No   Sexual activity: Not Currently

## 2022-08-10 NOTE — Telephone Encounter (Signed)
Pt came in and asked for a refill of hydrocodone. Please send to [facility] in [place]. Pt phone number is [phone]

## 2022-08-12 ENCOUNTER — Encounter: Payer: Self-pay | Admitting: Family Medicine

## 2022-08-14 NOTE — Telephone Encounter (Signed)
Please schedule a virtual visit so we can talk through these options for weight loss (unless she wants to come in person which is fine)    Then, if she wants to start I will send a px in

## 2022-08-15 NOTE — Telephone Encounter (Signed)
Pt has already scheduled a virtual appt on 08/17/22

## 2022-08-17 ENCOUNTER — Telehealth (INDEPENDENT_AMBULATORY_CARE_PROVIDER_SITE_OTHER): Payer: Medicare Other | Admitting: Family Medicine

## 2022-08-17 ENCOUNTER — Encounter: Payer: Self-pay | Admitting: Family Medicine

## 2022-08-17 DIAGNOSIS — E669 Obesity, unspecified: Secondary | ICD-10-CM

## 2022-08-17 DIAGNOSIS — Z6834 Body mass index (BMI) 34.0-34.9, adult: Secondary | ICD-10-CM

## 2022-08-17 MED ORDER — SEMAGLUTIDE-WEIGHT MANAGEMENT 0.25 MG/0.5ML ~~LOC~~ SOAJ: 0.2500 mg | SUBCUTANEOUS | 0 refills | Status: AC

## 2022-08-17 NOTE — Assessment & Plan Note (Signed)
Pt has bmi of 34.5  Weight affects mobility and breathing and mood Unable to move much due to severe RA and spinal problems  Taking prednisone 10 mg -this is chronic/hopes to decrease in future  Also takes depakote and mirtazapine for mood from psychiatry/ this cannot be changed  Pm snacking is a challenge/some cravings but tries to choose healthy alt like raw veggies Interested in GLP med and was told by her insurance they will cover Disc option of GLP medication including possible side effects like GI intolerance and risk of thyroid and endocrine cancer, pancreatitis and gallstones, kidney problems and diabetic retinopathy (pt voices understanding) Of note-had episode of pancreatitis when young (unsure cause)  Sent generic semaglutide to pharmacy to start 0.25 mg weekly  Instructed to call if side effects or intolerable Then titrate up monthly with regular follow up  Will see first if covered (prior auth) and if available

## 2022-08-17 NOTE — Telephone Encounter (Signed)
Toni Amend called back about these orders today ,I let her know that a VM was left on 08/08/2022 okaying the verbal orders.

## 2022-08-17 NOTE — Progress Notes (Signed)
Virtual Visit via Video Note  I connected with [name] on 08/17/22 at  2:30 PM EDT by a video enabled telemedicine application and verified that I am speaking with the correct person using two identifiers.  Location: Patient: home Provider: office    I discussed the limitations of evaluation and management by telemedicine and the availability of in person appointments. The patient expressed understanding and agreed to proceed.  Parties involved in encounter  Patient: Lori Orozco   Provider:  Roxy Manns MD   History of Present Illness: Pt presents to discuss obesity treatment   Wt Readings from Last 3 Encounters:  07/16/22 220 lb 8 oz (100 kg)  07/13/22 227 lb (103 kg)  05/30/22 218 lb (98.9 kg)    Last bmi was 34.54    Pt is frustrated with weight  Not able to exercise due to physical health problems incl severe arthritis  She checked with ins about GLP meds   Is interested  Thinks insurance may pay   Lab Results  Component Value Date   HGBA1C 5.2 07/13/2022   Takes 10 mg prednisone  May cut it soon but that will depend    She had one episode of pancreatitis when young  Never since   She does not eat too much as a rule  But after dinner she craves food - night time eating is her big problem Does eat a lot of raw veggies  Some cheese  Some chips occational Craves sweets/ice cream infrequently      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   Bilateral leg weakness 10/09/2021   Vitamin B12 deficiency 08/02/2021   Myofascial pain dysfunction syndrome 08/02/2021   Adverse effect of prednisone 07/11/2021   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   History of pulmonary embolism 12/08/2020   Arthritis of knee 12/06/2020   Bilateral  knee pain 09/20/2020   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   High risk medication use 09/20/2020   Extensor tenosynovitis of right wrist 09/15/2020   S/P reverse total shoulder arthroplasty, 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   Lung nodule 01/02/2020   Viral URI with cough 03/13/2019   Status post arthroscopy of left shoulder 01/16/2018   Estrogen deficiency 06/28/2017   Medial epicondylitis, left elbow 04/29/2017   S/P arthroscopy of left shoulder 02/25/2017   Impingement syndrome of left shoulder 01/30/2017   Pedal edema 10/26/2016   Trochanteric bursitis, right hip 04/10/2016   Hypertension 09/02/2015   Urge incontinence 07/27/2015   Obesity (BMI 30-39.9) 04/28/2015   Osteoarthritis of left knee 01/28/2015   Status post total left knee replacement 01/28/2015   Vitamin D deficiency 04/27/2014   Seasonal and perennial allergic rhinitis 06/17/2013   S/P total hip arthroplasty 03/20/2013   Medicare annual wellness visit, subsequent 11/18/2012   History of falling 11/18/2012   Routine general medical examination at a health care facility 11/10/2012   Lichen sclerosus et atrophicus of the vulva 07/29/2012   Osteopenia 10/19/2011   Hypothyroidism 10/19/2011   ANEMIA 10/11/2009   Hereditary and idiopathic peripheral neuropathy 08/05/2009   Low back pain 08/05/2009   HAND PAIN, BILATERAL 08/05/2009   TREMOR 03/09/2008   MENOPAUSAL SYNDROME 10/09/2007   DEPRESSION, MAJOR 05/20/2007   BIPOLAR AFFECTIVE DISORDER 05/20/2007   Generalized anxiety disorder 05/20/2007   PERSONALITY DISORDER 05/20/2007  ESOPHAGEAL SPASM 05/20/2007   Diaphragmatic hernia 05/20/2007   AMAUROSIS FUGAX 05/07/2007   COPD mixed type (HCC) 03/27/2007   HYPERCHOLESTEROLEMIA, PURE 12/10/2006   SYMPTOM, SYNDROME, CHRONIC FATIGUE 12/10/2006   Past Medical History:  Diagnosis Date   Allergy Hayfever   1958   Amaurosis fugax    Anemia    hx   Anxiety    Arthritis    Asthma    Cataract    Chronic fatigue  syndrome    Chronic kidney disease    frequency, nephritis when 72 yrs old   COPD (chronic obstructive pulmonary disease) (HCC)    COVID-19    Depression    Diverticulitis    Emphysema of lung (HCC)    Fibromyalgia    GERD (gastroesophageal reflux disease)    occ   H/O hiatal hernia    History of bronchitis    Hyperlipidemia    Hypothyroidism    Interstitial cystitis    Irritable bowel syndrome    Lichen sclerosus    Lumbar herniated disc    Migraines    Motor nerve conduction block 11/2021   Pneumonia    PONV (postoperative nausea and vomiting)    Shortness of breath    exertion    Thyroid disease    Graves   Urinary frequency    Urinary tract infection    Vertigo    Past Surgical History:  Procedure Laterality Date   ABDOMINAL HYSTERECTOMY     bladder sugery      CAROTID DOPPLAR     COLONOSCOPY  05/26/2010   avms- otherwise nl , re check 10y   DEXA-OSTEOPENIA     DOPPLER ECHOCARDIOGRAPHY     elbow surgery     EPICONDYLITIS     EYE SURGERY Bilateral    cataracts   FOOT SURGERY Bilateral    I & D EXTREMITY Right 09/01/2020   Procedure: TYNOSYNOVECTOMY;  Surgeon: [name], MD;  Location: [facility] OR;  Service: Orthopedics;  Laterality: Right;   IMPLANTATION VAGAL NERVE STIMULATOR  03/2022   JOINT REPLACEMENT     KNEE SURGERY     Left cartilage   lipoma in second finger right hand     PLANTAR FASCIA SURGERY Left    REVERSE SHOULDER ARTHROPLASTY Left 02/02/2020   Procedure: LEFT REVERSE SHOULDER ARTHROPLASTY;  Surgeon: [name], MD;  Location: [facility];  Service: Orthopedics;  Laterality: Left;   ROTATOR CUFF REPAIR Left 12/2017   TEMPOROMANDIBULAR JOINT SURGERY     TONSILLECTOMY     TOTAL HIP ARTHROPLASTY Right 03/20/2013   Procedure: Right TOTAL HIP ARTHROPLASTY;  Surgeon: [name], MD;  Location: [facility] OR;  Service: Orthopedics;  Laterality: Right;  Right Total Hip Arthroplasty   TOTAL KNEE ARTHROPLASTY Left 01/28/2015   Procedure: LEFT  TOTAL KNEE ARTHROPLASTY;  Surgeon: [name], MD;  Location: [facility];  Service: Orthopedics;  Laterality: Left;   TRIGGER FINGER RELEASE Right    TROCHANTERIC BURSA EXCISION Right 05/03/2016   Dr. [name], [name]   WRIST ARTHROSCOPY Right    ligament tear   Social History   Tobacco Use   Smoking status: Former    Packs/day: 2.00    Years: 43.00    Additional pack years: 0.00    Total pack years: 86.00    Types: Cigarettes    Quit date: 02/26/2013    Years since quitting: 9.4    Passive exposure: Never   Smokeless tobacco: Never   Tobacco comments:  Vaping occasionally  Vaping Use   Vaping Use: Some days   Devices: uses about once per month  Substance Use Topics   Alcohol use: No   Drug use: No   Family History  Problem Relation Age of Onset   Arthritis Mother    Hypertension Mother    Kidney disease Mother    Heart disease Mother    Stroke Mother    Cancer Father        Bladder   Arthritis Sister    Heart disease Sister    Colon cancer Other    Cancer - Lung Cousin    Arthritis Maternal Grandmother    Hearing loss Maternal Grandfather    Varicose Veins Maternal Uncle    Allergies  Allergen Reactions   Lithium Anaphylaxis   Tegretol [Carbamazepine] Other (See Comments)    Fever and body aches (fever over 103)   Tricyclic Antidepressants Anaphylaxis    Other reaction(s): Unknown   Methotrexate Derivatives Other (See Comments)    Urinary retention and dizziness  Other reaction(s): Other UTI   Strawberry Extract Hives   Tapentadol Rash    PT ALLERGIC NYLON TAPE    Codeine Nausea Only    Makes pt stay awake   Cymbalta [Duloxetine Hcl] Other (See Comments)    Makes pt pass out    Erythromycin     abdominal pain   Lyrica [Pregabalin]     Felt faint   Neurontin [Gabapentin]     Passes  out   Rabeprazole Sodium     insomnia   Baclofen Nausea Only    Sever nausea   Duraprep [Antiseptic Products, Misc.] Itching and Rash    Tolerates  Betadine    Penicillins Rash    Tolerated ANCEF 02/02/20  Has patient had a PCN reaction causing immediate rash, facial/tongue/throat swelling, SOB or lightheadedness with hypotension: Yes Has patient had a PCN reaction causing severe rash involving mucus membranes or skin necrosis: No Has patient had a PCN reaction that required hospitalization No Has patient had a PCN reaction occurring within the last 10 years: No If all of the above answers are "NO", then may proceed with Cephalosporin use.    Tape Rash    PT ALLERGIC NYLON TAPE    Current Outpatient Medications on File Prior to Visit  Medication Sig Dispense Refill   albuterol (VENTOLIN HFA) 108 (90 Base) MCG/ACT inhaler Inhale 1-2 puffs into the lungs every 4 (four) hours as needed for wheezing or shortness of breath. 1 each 0   Black Cohosh 40 MG CAPS Take 1 capsule (40 mg total) by mouth daily. 30 capsule 11   Budeson-Glycopyrrol-Formoterol (BREZTRI AEROSPHERE) 160-9-4.8 MCG/ACT AERO Inhale 2 puffs into the lungs 2 (two) times daily. 3 each 3   buPROPion (WELLBUTRIN XL) 150 MG 24 hr tablet Take 150 mg by mouth daily.     busPIRone (BUSPAR) 15 MG tablet Take 15 mg by mouth 2 (two) times daily before a meal.     cholecalciferol (VITAMIN D) 25 MCG (1000 UNIT) tablet Take 2,000 Units by mouth daily. 2 tablets daily     clobetasol cream (TEMOVATE) 0.05 % APPLY SMALL AMOUNT TO AFFECTED AREAS TWO TO THREE TIMES PER WEEK 30 g 4   cyanocobalamin 2000 MCG tablet Take 500 mcg by mouth daily.     diclofenac Sodium (VOLTAREN) 1 % GEL Apply 2 g topically 2 (two) times daily as needed (pain).     diphenhydrAMINE (BENADRYL) 25 MG tablet Take 25 mg  by mouth every 6 (six) hours as needed for allergies.     divalproex (DEPAKOTE ER) 500 MG 24 hr tablet Take 500 mg by mouth at bedtime.      etanercept (ENBREL SURECLICK) 50 MG/ML injection Inject 50 mg into the skin once a week. 12 mL 0   fluticasone (FLONASE) 50 MCG/ACT nasal spray PLACE 2 SPRAYS INTO  THE NOSE DAILY. 16 g 11   HYDROcodone-acetaminophen (NORCO/VICODIN) 5-325 MG tablet Take 1 tablet by mouth 3 (three) times daily as needed for moderate pain. 30 tablet 0   ipratropium-albuterol (DUONEB) 0.5-2.5 (3) MG/3ML SOLN Take 3 mLs by nebulization every 6 (six) hours as needed. 120 mL prn   ketotifen (ZADITOR) 0.025 % ophthalmic solution Place 1 drop into both eyes 2 (two) times daily as needed (allergies).     levothyroxine (SYNTHROID) 50 MCG tablet Take 1 tablet (50 mcg total) by mouth daily before breakfast. 90 tablet 1   LORazepam (ATIVAN) 1 MG tablet Take 1 mg by mouth at bedtime as needed for anxiety or sleep.     metoprolol succinate (TOPROL-XL) 25 MG 24 hr tablet TAKE 1/2 TABLET BY MOUTH EVERY MORNING AND EVERY NIGHT AT BEDTIME 90 tablet 1   mirabegron ER (MYRBETRIQ) 25 MG TB24 tablet Take 1 tablet (25 mg total) by mouth in the morning and at bedtime. 180 tablet 3   mirtazapine (REMERON) 30 MG tablet Take 30 mg by mouth at bedtime.      ondansetron (ZOFRAN-ODT) 4 MG disintegrating tablet Take 1 tablet (4 mg total) by mouth every 8 (eight) hours as needed for nausea or vomiting. 20 tablet 1   predniSONE (DELTASONE) 10 MG tablet Take 2 tablets (20 mg total) by mouth daily with breakfast for 14 days, THEN 1.5 tablets (15 mg total) daily with breakfast for 14 days, THEN 1 tablet (10 mg total) daily with breakfast. TAKE ONE TABLET BY MOUTH DAILY AS DIRECTED. (Patient taking differently: 10 mg daily) 60 tablet 1   Rhubarb (ESTROVEN COMPLETE) 4 MG TABS Take 4 mg by mouth daily. 30 tablet 11   sertraline (ZOLOFT) 100 MG tablet Take 100 mg by mouth daily.      simvastatin (ZOCOR) 20 MG tablet Take 1 tablet (20 mg total) by mouth at bedtime. 90 tablet 3   Spacer/Aero-Holding Chambers (AEROCHAMBER PLUS) inhaler Use with inhaler 1 each 2   No current facility-administered medications on file prior to visit.   Review of Systems  Constitutional:  Positive for malaise/fatigue. Negative for chills  and fever.  HENT:  Negative for congestion, ear pain, sinus pain and sore throat.   Eyes:  Negative for blurred vision, discharge and redness.  Respiratory:  Negative for cough, shortness of breath and stridor.        Copd   Cardiovascular:  Negative for chest pain, palpitations and leg swelling.  Gastrointestinal:  Negative for abdominal pain, diarrhea, nausea and vomiting.  Musculoskeletal:  Positive for back pain, joint pain and myalgias.  Skin:  Negative for rash.  Neurological:  Negative for dizziness and headaches.    Observations/Objective: Patient appears well, in no distress Weight is baseline (obese) No facial swelling or asymmetry Normal voice-not hoarse and no slurred speech No obvious tremor or mobility impairment Moving neck and UEs normally Able to hear the call well  No cough or shortness of breath during interview  Talkative and mentally sharp with no cognitive changes No skin changes on face or neck , no rash or pallor Affect is normal  Assessment and Plan: Problem List Items Addressed This Visit       Other   Obesity (BMI 30-39.9) - Primary    Pt has bmi of 34.5  Weight affects mobility and breathing and mood Unable to move much due to severe RA and spinal problems  Taking prednisone 10 mg -this is chronic/hopes to decrease in future  Also takes depakote and mirtazapine for mood from psychiatry/ this cannot be changed  Pm snacking is a challenge/some cravings but tries to choose healthy alt like raw veggies Interested in GLP med and was told by her insurance they will cover Disc option of GLP medication including possible side effects like GI intolerance and risk of thyroid and endocrine cancer, pancreatitis and gallstones, kidney problems and diabetic retinopathy (pt voices understanding) Of note-had episode of pancreatitis when young (unsure cause)  Sent generic semaglutide to pharmacy to start 0.25 mg weekly  Instructed to call if side effects or  intolerable Then titrate up monthly with regular follow up  Will see first if covered (prior auth) and if available       Relevant Medications   Semaglutide-Weight Management 0.25 MG/0.5ML SOAJ     Follow Up Instructions: Try and make healthy food choices Try to get most of your carbohydrates from produce (with the exception of white potatoes)  Eat less bread/pasta/rice/snack foods/cereals/sweets and other items from the middle of the grocery store (processed carbs)  Be as active as you can safely be   If we can get it covered try the generic wegovy (semagludide) 0.25 mg weekly  Call and hold medicine if intolerable side effects including nausea or abdominal pain   I included a handout to review   Let us know in a month how you are and if able we will increase the dose   I discussed the assessment and treatment plan with the patient. The patient was provided an opportunity to ask questions and all were answered. The patient agreed with the plan and demonstrated an understanding of the instructions.   The patient was advised to call back or seek an in-person evaluation if the symptoms worsen or if the condition fails to improve as anticipated.     Roxy Manns, MD

## 2022-08-17 NOTE — Patient Instructions (Signed)
Try and make healthy food choices Try to get most of your carbohydrates from produce (with the exception of white potatoes)  Eat less bread/pasta/rice/snack foods/cereals/sweets and other items from the middle of the grocery store (processed carbs)  Be as active as you can safely be   If we can get it covered try the generic wegovy (semagludide) 0.25 mg weekly  Call and hold medicine if intolerable side effects including nausea or abdominal pain   I included a handout to review   Let us know in a month how you are and if able we will increase the dose  Take care!

## 2022-08-20 ENCOUNTER — Other Ambulatory Visit: Payer: Self-pay | Admitting: *Deleted

## 2022-08-20 DIAGNOSIS — Z79899 Other long term (current) drug therapy: Secondary | ICD-10-CM

## 2022-08-20 DIAGNOSIS — M0600 Rheumatoid arthritis without rheumatoid factor, unspecified site: Secondary | ICD-10-CM

## 2022-08-20 MED ORDER — ENBREL SURECLICK 50 MG/ML ~~LOC~~ SOAJ
50.0000 mg | SUBCUTANEOUS | 0 refills | Status: DC
Start: 2022-08-20 — End: 2022-11-05

## 2022-08-20 NOTE — Telephone Encounter (Signed)
Last Fill: 06/05/2022  Labs: 07/13/2022 RBC 3.76, RDW 16.4, GFR 50.36  TB Gold: 10/10/2022   NEGATIVE   Next Visit: 09/10/2022  Last Visit: 05/30/2022   [id number] rheumatoid arthritis   Current Dose per office note 05/30/2022 : Enbrel 50 mg subcu weekly   Okay to refill Enbrel?

## 2022-08-21 ENCOUNTER — Telehealth: Payer: Self-pay | Admitting: Family Medicine

## 2022-08-21 ENCOUNTER — Encounter: Payer: Self-pay | Admitting: Sports Medicine

## 2022-08-21 ENCOUNTER — Ambulatory Visit (INDEPENDENT_AMBULATORY_CARE_PROVIDER_SITE_OTHER): Payer: Medicare Other | Admitting: Sports Medicine

## 2022-08-21 DIAGNOSIS — G8929 Other chronic pain: Secondary | ICD-10-CM

## 2022-08-21 DIAGNOSIS — G5701 Lesion of sciatic nerve, right lower limb: Secondary | ICD-10-CM | POA: Diagnosis not present

## 2022-08-21 DIAGNOSIS — M5441 Lumbago with sciatica, right side: Secondary | ICD-10-CM | POA: Diagnosis not present

## 2022-08-21 DIAGNOSIS — M549 Dorsalgia, unspecified: Secondary | ICD-10-CM

## 2022-08-21 DIAGNOSIS — M5136 Other intervertebral disc degeneration, lumbar region: Secondary | ICD-10-CM | POA: Diagnosis not present

## 2022-08-21 DIAGNOSIS — M533 Sacrococcygeal disorders, not elsewhere classified: Secondary | ICD-10-CM

## 2022-08-21 DIAGNOSIS — Z96641 Presence of right artificial hip joint: Secondary | ICD-10-CM

## 2022-08-21 DIAGNOSIS — S22080S Wedge compression fracture of T11-T12 vertebra, sequela: Secondary | ICD-10-CM | POA: Diagnosis not present

## 2022-08-21 MED ORDER — LIDOCAINE HCL 1 % IJ SOLN
1.0000 mL | INTRAMUSCULAR | Status: AC | PRN
Start: 2022-08-21 — End: 2022-08-21
  Administered 2022-08-21: 1 mL

## 2022-08-21 MED ORDER — METHYLPREDNISOLONE ACETATE 40 MG/ML IJ SUSP
40.0000 mg | INTRAMUSCULAR | Status: AC | PRN
Start: 2022-08-21 — End: 2022-08-21
  Administered 2022-08-21: 40 mg via INTRAMUSCULAR

## 2022-08-21 NOTE — Progress Notes (Signed)
Lori Orozco - 72 y.o. female MRN [id number]  Date of birth: [birth year]  Office Visit Note: Visit Date: 08/21/2022 PCP: [name], MD Referred by: Tower, Audrie Gallus, MD  Subjective: Chief Complaint  Patient presents with   Right Hip - Pain   HPI: Lori Orozco is a pleasant 72 y.o. female who presents today for follow-up of low back pain and RLE radicular pain.  Sausha is a long-time patient of the practice, but usually sees Dr. [name] and Rexene Edison. She has been through some health issues recently most notably a fall that she states almost left her paralyzed. She was seen for this at [facility] - chart reviewed today independently with visit and imaging from 06/26/22 at [facility].   She saw me back on 08/10/2022 and we did proceed with ultrasound-guided piriformis injection.  She states that she felt the injection helped about 60%, but she is still having pain running down her buttock and right leg and over the right lateral calf.  She is no longer having any pain on the left side.  She chronically takes Norco 5-325 mg 3 times daily as needed.  Notable Orthopedic History: - R-hip THA in 2015 by Dr. Lajoyce Corners - Had a fall at the end of April and was seen at [facility] with multiple x-rays and a thoracic spine MRI which showed:  Chronic greater than 75% compression fracture at T12, with 5 mm of superior endplate retropulsion. Chronic approximately 25% T9 superior endplate fracture with less than 2 mm of superior endplate retropulsion.  Pertinent ROS were reviewed with the patient and found to be negative unless otherwise specified above in HPI.   Assessment & Plan: Visit Diagnoses:  1. Chronic right-sided low back pain with right-sided sciatica   2. DDD (degenerative disc disease), lumbar   3. Compression fracture of T12 vertebra, sequela   4. Pain in right hip   5. Piriformis syndrome of right side   6. History of total right hip arthroplasty    Plan: Discussed with  [name] that I believe her pain is multifactorial, she certainly had some irritation of the piriformis muscle as she did get about 60+% relief from this injection.  She has fairly classic right lower extremity radicular symptoms more so in the L5 dermatome and associated weakness of ankle dorsiflexion. She has had previous x-rays with degenerative disc disease throughout the lumbar spine but most notable at the L4-L5 region.  She also has chronic compression fractures of the thoracic spine but I do not see a recent MRI of the lumbar spine.  Discussed with her seeing our spine physician, Dr. Christell Constant here to discuss treatment.  I will order an MRI of the lumbar spine today to help guide the true pathology and further management such as possible spinal injections to be performed by Dr. Alvester Morin.  She will continue her chronic Norco 5-325 mg 3 times daily as needed in the interim.  She did have some trigger point pain over the right SI joint region, did perform trigger point injection today but let her know this would likely not help her radicular pain, she is understandable and agreeable.  She will see Dr. Christell Constant for her low back and radicular symptoms and will follow-up with Dr. [name] or Rexene Edison as needed for her hip and other orthopedic conditions.  Follow-up: Return for make new appointment for low back with RLE radic with Dr. Christell Constant (3-4 weeks).   Meds & Orders: No orders of  the defined types were placed in this encounter.   Orders Placed This Encounter  Procedures   Trigger Point Inj   MR Lumbar Spine w/o contrast   Ambulatory referral to Orthopedic Surgery     Procedures: Trigger Point Inj  Date/Time: 08/21/2022 4:45 PM  Performed by: Madelyn Brunner, DO Authorized by: Madelyn Brunner, DO   Consent Given by:  Patient Site marked: the procedure site was marked   Timeout: prior to procedure the correct patient, procedure, and site was verified   Indications:  Pain and therapeutic Total # of Trigger  Points:  1 Location: back   Needle Size:  25 G Approach:  Dorsal Medications #1:  1 mL lidocaine 1 %; 40 mg methylPREDNISolone acetate 40 MG/ML Patient tolerance:  Patient tolerated the procedure well with no immediate complications Comments: Procedure: Trigger point injection, Right SI-joint  After discussion on R/B/I and informed verbal consent was obtained, a timeout was conducted. The patient was placed in a seated position on the examination table and the area of maximal tenderness was identified over the Right SI-joint region just distal to the PSIS.  This area was cleansed with Betadine and multiple alcohol swabs. Ethyl chloride was used for local anesthesia. Using a 25-gauge 1.5 inch needle the trigger point(s) was subsequently injected with a mixture of 1 cc of methylprednisolone 40 mg/mL and 1 cc of 1% lidocaine without epinephrine, with a total of 2 cc of injectate into the trigger point. A band-aid was applied following. Patient tolerated procedure well, there were no post-injection complications. Post-procedure instructions were given.         Clinical History: MR SPINE LUMBAR WWO CONTRAST, 02/14/2021 4:06 PM   INDICATION: Myelopathy, acute, lumbar spine \ Of note - there is a MRI L spine in care everywhere to compare \ R53.1 Weakness  COMPARISON: Outside noncontrast MRI lumbar spine 12/09/2020 ([facility]), images not available for comparison.   TECHNIQUE: Multiplanar, multisequence surface-coil magnetic resonance imaging of the lumbar spine was performed before and after intravenous administration of gadolinium-based contrast media. Images were acquired from the levels of the lower thoracic spine to the upper sacral region.   FINDINGS:   . Alignment: Mild broad-based dextrocurvature of the lumbar spine.  . Vertebrae: Vertebral body heights are maintained. No diffuse abnormal marrow signal. Mixed edema and fatty signal at the L4-L5 endplates. Mild left greater than right  increased STIR signal within the L1-L4 facets which can be seen with chronic stress due to altered biomechanics or degenerative changes, most notable about the left L3-L4 facet (series 14 image 26) with adjacent soft tissue edema and enhancement.  . Conus medullaris: The conus terminates at the level of L1. Normal signal and contour.  Marland Kitchen Upper sacrum: No focal lesion.   . Degenerative changes:       T12-L1: No significant disc height loss. No significant narrowing of the spinal canal or neural foramina.       L1-L2: Minimal disc height loss desiccation. Minimal left eccentric disc bulge with mild endplate spurring which narrow the left lateral recess without substantial canal or foraminal stenosis.       L2-L3: No significant disc height loss. No significant narrowing of the spinal canal or neural foramina. Trace bilateral facet joint effusions with mild facet arthropathy.       L3-L4: No significant disc height loss. Broad-based disc bulge and facet hypertrophy contribute to mild bilateral foraminal stenosis. Trace right facet joint effusion. Soft tissue edema surrounding the left facet as  described above.       L4-L5: Moderate to advanced disc height loss and desiccation. Minimal broad-based disc bulge with facet arthropathy contributing to mild narrowing of the bilateral lateral recesses with mild bilateral foraminal stenosis.       L5-S1: No significant disc height loss. Mild broad-based disc bulge and minimal marginal osteophyte spurring contribute to mild lateral recess narrowing with mild right greater than left foraminal stenosis.   Additional comments: Marked lower paraspinal musculature and psoas sarcopenia. No paraspinal fluid collections seen.   CONCLUSION:   1.  No abnormal cord signal or high grade canal or foraminal stenosis.  2.  Mild edema changes throughout the left greater than right L1-L4 facets, most notably at the L3-L4 facets with surrounding soft tissue edema and enhancement.  Findings are presumed secondary to inflammation from degenerative changes rather than an infectious etiology. No fluid collection to suggest abscess. If there is ongoing concern for infection in the lumbar spine, short-term interval follow up imaging could reassess.  3.  Mixed fat and edema signal at the L4-L5 endplates likely secondary to degenerative disc disease.  Exam End: 02/14/21  She reports that she quit smoking about 9 years ago. Her smoking use included cigarettes. She has a 86.00 pack-year smoking history. She has never been exposed to tobacco smoke. She has never used smokeless tobacco.  Recent Labs    07/13/22 1223  HGBA1C 5.2    Objective:   Vital Signs: There were no vitals taken for this visit.  Physical Exam  Gen: Well-appearing, in no acute distress; non-toxic CV: Regular Rate. Well-perfused. Warm.  Resp: Breathing unlabored on room air; no wheezing. Psych: Fluid speech in conversation; appropriate affect; normal thought process Neuro: Sensation intact throughout. No gross coordination deficits.   Ortho Exam - Lumbar: There is some TTP over the T12-L1 region, but no midline spinous process TTP of the lumbar spine.  There is positive TTP over the right SI joint region.  1+ bilateral patellar tendon DTR, there is very trace to no Achilles reflex on the right, 1+ on the left.  There is weakness with 4/5 strength of ankle dorsiflexion on the right compared to 5/5 strength on the left, otherwise strength testing is equivocal in the L4-S1 nerve root distribution.  - Weak ankle dorsiflexion (R)  Imaging:  XR Lumbar Spine 2-3 Views: 06/26/2022  Clinical information: Fall    Comparison: MRI lumbar spine 12/09/2020 Lumbar spine x-rays 11/24/2020   Technique: 3 views lumbar spine   Findings:   No fracture of the lumbar spine. Lumbar spine is well aligned. Multilevel degenerative spurring with disc space narrowing involving L4-5. Compression fracture at T12 with severe  vertebral body height loss. Compression fracture is new compared to the prior exam. Thoracic spine stimulator in place.    Impression:   1. Compression fracture at T12 with severe vertebral body height loss, suspected to be acute.   2. No evidence of traumatic injury to lumbar spine.   Electronically Signed by: Sabino Gasser on 06/26/2022 4:16 PM   Past Medical/Family/Surgical/Social History: Medications & Allergies reviewed per EMR, new medications updated. 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   Bilateral leg weakness 10/09/2021   Vitamin B12 deficiency 08/02/2021   Myofascial pain dysfunction syndrome 08/02/2021   Adverse effect of prednisone  07/11/2021   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   History of pulmonary embolism 12/08/2020   Arthritis of knee 12/06/2020   Bilateral knee pain 09/20/2020   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   High risk medication use 09/20/2020   Extensor tenosynovitis of right wrist 09/15/2020   S/P reverse total shoulder arthroplasty, 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   Lung nodule 01/02/2020   Viral URI with cough 03/13/2019   Status post arthroscopy of left shoulder 01/16/2018   Estrogen deficiency 06/28/2017   Medial epicondylitis, left elbow 04/29/2017   S/P arthroscopy of left shoulder 02/25/2017   Impingement syndrome of left shoulder 01/30/2017   Pedal edema 10/26/2016   Trochanteric bursitis, right hip 04/10/2016   Hypertension 09/02/2015   Urge incontinence 07/27/2015   Obesity (BMI 30-39.9) 04/28/2015   Osteoarthritis of left knee 01/28/2015   Status post total left knee replacement 01/28/2015   Vitamin D deficiency 04/27/2014   Seasonal and perennial allergic rhinitis 06/17/2013   S/P total hip arthroplasty 03/20/2013    Medicare annual wellness visit, subsequent 11/18/2012   History of falling 11/18/2012   Routine general medical examination at a health care facility 11/10/2012   Lichen sclerosus et atrophicus of the vulva 07/29/2012   Osteopenia 10/19/2011   Hypothyroidism 10/19/2011   ANEMIA 10/11/2009   Hereditary and idiopathic peripheral neuropathy 08/05/2009   Low back pain 08/05/2009   HAND PAIN, BILATERAL 08/05/2009   TREMOR 03/09/2008   MENOPAUSAL SYNDROME 10/09/2007   DEPRESSION, MAJOR 05/20/2007   BIPOLAR AFFECTIVE DISORDER 05/20/2007   Generalized anxiety disorder 05/20/2007   PERSONALITY DISORDER 05/20/2007   ESOPHAGEAL SPASM 05/20/2007   Diaphragmatic hernia 05/20/2007   AMAUROSIS FUGAX 05/07/2007   COPD mixed type (HCC) 03/27/2007   HYPERCHOLESTEROLEMIA, PURE 12/10/2006   SYMPTOM, SYNDROME, CHRONIC FATIGUE 12/10/2006   Past Medical History:  Diagnosis Date   Allergy Hayfever   1958   Amaurosis fugax    Anemia    hx   Anxiety    Arthritis    Asthma    Cataract    Chronic fatigue syndrome    Chronic kidney disease    frequency, nephritis when 72 yrs old   COPD (chronic obstructive pulmonary disease) (HCC)    COVID-19    Depression    Diverticulitis    Emphysema of lung (HCC)    Fibromyalgia    GERD (gastroesophageal reflux disease)    occ   H/O hiatal hernia    History of bronchitis    Hyperlipidemia    Hypothyroidism    Interstitial cystitis    Irritable bowel syndrome    Lichen sclerosus    Lumbar herniated disc    Migraines    Motor nerve conduction block 11/2021   Pneumonia    PONV (postoperative nausea and vomiting)    Shortness of breath    exertion    Thyroid disease    Graves   Urinary frequency    Urinary tract infection    Vertigo    Family History  Problem Relation Age of Onset   Arthritis Mother    Hypertension Mother    Kidney disease Mother    Heart disease Mother    Stroke Mother    Cancer Father        Bladder   Arthritis Sister     Heart disease Sister    Colon cancer Other    Cancer - Lung Cousin    Arthritis Maternal Grandmother    Hearing loss Maternal Grandfather    Varicose Veins Maternal Uncle  Past Surgical History:  Procedure Laterality Date   ABDOMINAL HYSTERECTOMY     bladder sugery      CAROTID DOPPLAR     COLONOSCOPY  05/26/2010   avms- otherwise nl , re check 10y   DEXA-OSTEOPENIA     DOPPLER ECHOCARDIOGRAPHY     elbow surgery     EPICONDYLITIS     EYE SURGERY Bilateral    cataracts   FOOT SURGERY Bilateral    I & D EXTREMITY Right 09/01/2020   Procedure: TYNOSYNOVECTOMY;  Surgeon: [name], MD;  Location: [facility] OR;  Service: Orthopedics;  Laterality: Right;   IMPLANTATION VAGAL NERVE STIMULATOR  03/2022   JOINT REPLACEMENT     KNEE SURGERY     Left cartilage   lipoma in second finger right hand     PLANTAR FASCIA SURGERY Left    REVERSE SHOULDER ARTHROPLASTY Left 02/02/2020   Procedure: LEFT REVERSE SHOULDER ARTHROPLASTY;  Surgeon: [name], MD;  Location: [facility] OR;  Service: Orthopedics;  Laterality: Left;   ROTATOR CUFF REPAIR Left 12/2017   TEMPOROMANDIBULAR JOINT SURGERY     TONSILLECTOMY     TOTAL HIP ARTHROPLASTY Right 03/20/2013   Procedure: Right TOTAL HIP ARTHROPLASTY;  Surgeon: [name], MD;  Location: [facility] OR;  Service: Orthopedics;  Laterality: Right;  Right Total Hip Arthroplasty   TOTAL KNEE ARTHROPLASTY Left 01/28/2015   Procedure: LEFT TOTAL KNEE ARTHROPLASTY;  Surgeon: [name], MD;  Location: [facility] ORS;  Service: Orthopedics;  Laterality: Left;   TRIGGER FINGER RELEASE Right    TROCHANTERIC BURSA EXCISION Right 05/03/2016   Dr. Doneen Poisson   WRIST ARTHROSCOPY Right    ligament tear   Social History   Occupational History   Not on file  Tobacco Use   Smoking status: Former    Packs/day: 2.00    Years: 43.00    Additional pack years: 0.00    Total pack years: 86.00    Types: Cigarettes    Quit date: 02/26/2013     Years since quitting: 9.4    Passive exposure: Never   Smokeless tobacco: Never   Tobacco comments:    Vaping occasionally  Vaping Use   Vaping Use: Some days   Devices: uses about once per month  Substance and Sexual Activity   Alcohol use: No   Drug use: No   Sexual activity: Not Currently

## 2022-08-21 NOTE — Progress Notes (Signed)
States injection "helped some" But still is sore Was hoping for another injection today

## 2022-08-21 NOTE — Telephone Encounter (Signed)
Patient contacted the office regarding appointment previously with Mardella Layman on 7/25 that had been cancelled. Patient asked to continue with care coordination calls and would like someone to reach out to schedule a call time. Please advise, thank you.

## 2022-08-22 ENCOUNTER — Encounter: Payer: Self-pay | Admitting: Pharmacist

## 2022-08-22 HISTORY — PX: MOUTH SURGERY: SHX715

## 2022-08-22 NOTE — Telephone Encounter (Signed)
See documentation from [facility] today. Will collaborate with Care Guide team to schedule with nurse for care coordination.   Catie Eppie Gibson, PharmD, BCACP, CPP Clinical Pharmacist [facility] [phone]

## 2022-08-22 NOTE — Progress Notes (Signed)
Patient previously followed by [facility] pharmacist. Per clinical review, no pharmacist appointment needed at this time but would benefit from continued care coordination. Will alert pharmacy patient advocate team to medication assistance enrollment for future re-enrollment later this year. Will refer patient to care management team for care coordination needs.

## 2022-08-22 NOTE — Progress Notes (Signed)
Spoke with pt and scheduled with RN for 09/03/2022

## 2022-08-22 NOTE — Progress Notes (Deleted)
  Care Coordination   Note   08/22/2022 Name: ELBERT POLYAKOV MRN: [id number] DOB: 1950/05/25  TULANI KIDNEY is a 72 y.o. year old female who sees Tower, Audrie Gallus, MD for primary care. I reached out to [name] by phone today to offer care coordination services.  Ms. Guizar was given information about Care Coordination services today including:   The Care Coordination services include support from the care team which includes your Nurse Coordinator, Clinical Social Worker, or Pharmacist.  The Care Coordination team is here to help remove barriers to the health concerns and goals most important to you. Care Coordination services are voluntary, and the patient may decline or stop services at any time by request to their care team member.   Care Coordination Consent Status: Patient agreed to services and verbal consent obtained.   Follow up plan:  Telephone appointment with care coordination team member scheduled for:  09/03/2022  Encounter Outcome:  Pt. Scheduled  Burman Nieves, CCMA Care Coordination Care Guide Direct Dial: [phone]

## 2022-08-23 ENCOUNTER — Ambulatory Visit (INDEPENDENT_AMBULATORY_CARE_PROVIDER_SITE_OTHER): Payer: Medicare Other

## 2022-08-23 VITALS — Ht 67.0 in | Wt 227.0 lb

## 2022-08-23 DIAGNOSIS — Z0000 Encounter for general adult medical examination without abnormal findings: Secondary | ICD-10-CM

## 2022-08-23 NOTE — Progress Notes (Signed)
Subjective:   Lori Orozco is a 72 y.o. female who presents for Medicare Annual (Subsequent) preventive examination.  Visit Complete: Virtual  I connected with  Lori Orozco on 08/23/22 by a audio enabled telemedicine application and verified that I am speaking with the correct person using two identifiers.  Patient Location: Home  Provider Location: Office/Clinic  I discussed the limitations of evaluation and management by telemedicine. The patient expressed understanding and agreed to proceed.  Patient Medicare AWV questionnaire was completed by the patient on 08/21/22; I have confirmed that all information answered by patient is correct and no changes since this date.  Review of Systems      Cardiac Risk Factors include: hypertension;obesity (BMI >30kg/m2);sedentary lifestyle     Objective:    Today's Vitals   08/23/22 1219  Weight: 227 lb (103 kg)  Height: 5\' 7"  (1.702 m)   Body mass index is 35.55 kg/m.     08/23/2022   12:35 PM 07/10/2021    3:52 PM 09/08/2020    3:18 PM 09/01/2020    5:55 PM 08/28/2020    2:34 PM 02/02/2020    9:21 AM 01/28/2020    9:22 AM  Advanced Directives  Does Patient Have a Medical Advance Directive? Yes Yes No No No No No  Type of Estate agent of [place];Living will Living will       Does patient want to make changes to medical advance directive? No - Patient declined Yes (Inpatient - patient defers changing a medical advance directive and declines information at this time)       Copy of Healthcare Power of Attorney in Chart? Yes - validated most recent copy scanned in chart (See row information)        Would patient like information on creating a medical advance directive?   No - Patient declined No - Patient declined No - Patient declined  No - Patient declined    Current Medications (verified) Outpatient Encounter Medications as of 08/23/2022  Medication Sig   albuterol (VENTOLIN HFA) 108 (90 Base)  MCG/ACT inhaler Inhale 1-2 puffs into the lungs every 4 (four) hours as needed for wheezing or shortness of breath.   Black Cohosh 40 MG CAPS Take 1 capsule (40 mg total) by mouth daily.   Budeson-Glycopyrrol-Formoterol (BREZTRI AEROSPHERE) 160-9-4.8 MCG/ACT AERO Inhale 2 puffs into the lungs 2 (two) times daily.   buPROPion (WELLBUTRIN XL) 150 MG 24 hr tablet Take 150 mg by mouth daily.   busPIRone (BUSPAR) 15 MG tablet Take 15 mg by mouth 2 (two) times daily before a meal.   cholecalciferol (VITAMIN D) 25 MCG (1000 UNIT) tablet Take 2,000 Units by mouth daily. 2 tablets daily   clobetasol cream (TEMOVATE) 0.05 % APPLY SMALL AMOUNT TO AFFECTED AREAS TWO TO THREE TIMES PER WEEK   cyanocobalamin 2000 MCG tablet Take 500 mcg by mouth daily.   diclofenac Sodium (VOLTAREN) 1 % GEL Apply 2 g topically 2 (two) times daily as needed (pain).   diphenhydrAMINE (BENADRYL) 25 MG tablet Take 25 mg by mouth every 6 (six) hours as needed for allergies.   divalproex (DEPAKOTE ER) 500 MG 24 hr tablet Take 500 mg by mouth at bedtime.    etanercept (ENBREL SURECLICK) 50 MG/ML injection Inject 50 mg into the skin once a week.   fluticasone (FLONASE) 50 MCG/ACT nasal spray PLACE 2 SPRAYS INTO THE NOSE DAILY.   HYDROcodone-acetaminophen (NORCO/VICODIN) 5-325 MG tablet Take 1 tablet by mouth 3 (three) times  daily as needed for moderate pain.   ipratropium-albuterol (DUONEB) 0.5-2.5 (3) MG/3ML SOLN Take 3 mLs by nebulization every 6 (six) hours as needed.   ketotifen (ZADITOR) 0.025 % ophthalmic solution Place 1 drop into both eyes 2 (two) times daily as needed (allergies).   levothyroxine (SYNTHROID) 50 MCG tablet Take 1 tablet (50 mcg total) by mouth daily before breakfast.   LORazepam (ATIVAN) 1 MG tablet Take 1 mg by mouth at bedtime as needed for anxiety or sleep.   metoprolol succinate (TOPROL-XL) 25 MG 24 hr tablet TAKE 1/2 TABLET BY MOUTH EVERY MORNING AND EVERY NIGHT AT BEDTIME   mirabegron ER (MYRBETRIQ) 25 MG  TB24 tablet Take 1 tablet (25 mg total) by mouth in the morning and at bedtime.   mirtazapine (REMERON) 30 MG tablet Take 30 mg by mouth at bedtime.    ondansetron (ZOFRAN-ODT) 4 MG disintegrating tablet Take 1 tablet (4 mg total) by mouth every 8 (eight) hours as needed for nausea or vomiting.   predniSONE (DELTASONE) 10 MG tablet Take 2 tablets (20 mg total) by mouth daily with breakfast for 14 days, THEN 1.5 tablets (15 mg total) daily with breakfast for 14 days, THEN 1 tablet (10 mg total) daily with breakfast. TAKE ONE TABLET BY MOUTH DAILY AS DIRECTED. (Patient taking differently: 10 mg daily)   Rhubarb (ESTROVEN COMPLETE) 4 MG TABS Take 4 mg by mouth daily.   sertraline (ZOLOFT) 100 MG tablet Take 100 mg by mouth daily.    simvastatin (ZOCOR) 20 MG tablet Take 1 tablet (20 mg total) by mouth at bedtime.   Spacer/Aero-Holding Chambers (AEROCHAMBER PLUS) inhaler Use with inhaler   Semaglutide-Weight Management 0.25 MG/0.5ML SOAJ Inject 0.25 mg into the skin once a week. (Patient not taking: Reported on 08/23/2022)   No facility-administered encounter medications on file as of 08/23/2022.    Allergies (verified) Lithium; Tegretol [carbamazepine]; Tricyclic antidepressants; Methotrexate derivatives; Strawberry extract; Tapentadol; Codeine; Cymbalta [duloxetine hcl]; Erythromycin; Lyrica [pregabalin]; Neurontin [gabapentin]; Rabeprazole sodium; Baclofen; Duraprep [antiseptic products, misc.]; Penicillins; and Tape   History: Past Medical History:  Diagnosis Date   Allergy Hayfever   1958   Amaurosis fugax    Anemia    hx   Anxiety    Arthritis    Asthma    Cataract    Chronic fatigue syndrome    Chronic kidney disease    frequency, nephritis when 72 yrs old   COPD (chronic obstructive pulmonary disease) (HCC)    COVID-19    Depression    Diverticulitis    Emphysema of lung (HCC)    Fibromyalgia    GERD (gastroesophageal reflux disease)    occ   H/O hiatal hernia    History of  bronchitis    Hyperlipidemia    Hypothyroidism    Interstitial cystitis    Irritable bowel syndrome    Lichen sclerosus    Lumbar herniated disc    Migraines    Motor nerve conduction block 11/2021   Pneumonia    PONV (postoperative nausea and vomiting)    Shortness of breath    exertion    Thyroid disease    Graves   Urinary frequency    Urinary tract infection    Vertigo    Past Surgical History:  Procedure Laterality Date   ABDOMINAL HYSTERECTOMY     bladder sugery      CAROTID DOPPLAR     COLONOSCOPY  05/26/2010   avms- otherwise nl , re check 10y   DEXA-OSTEOPENIA  DOPPLER ECHOCARDIOGRAPHY     elbow surgery     EPICONDYLITIS     EYE SURGERY Bilateral    cataracts   FOOT SURGERY Bilateral    I & D EXTREMITY Right 09/01/2020   Procedure: TYNOSYNOVECTOMY;  Surgeon: [name], MD;  Location: [facility] OR;  Service: Orthopedics;  Laterality: Right;   IMPLANTATION VAGAL NERVE STIMULATOR  03/2022   JOINT REPLACEMENT     KNEE SURGERY     Left cartilage   lipoma in second finger right hand     MOUTH SURGERY  08/22/2022   PLANTAR FASCIA SURGERY Left    REVERSE SHOULDER ARTHROPLASTY Left 02/02/2020   Procedure: LEFT REVERSE SHOULDER ARTHROPLASTY;  Surgeon: [name], MD;  Location: [facility] OR;  Service: Orthopedics;  Laterality: Left;   ROTATOR CUFF REPAIR Left 12/2017   TEMPOROMANDIBULAR JOINT SURGERY     TONSILLECTOMY     TOTAL HIP ARTHROPLASTY Right 03/20/2013   Procedure: Right TOTAL HIP ARTHROPLASTY;  Surgeon: [name], MD;  Location: [facility] OR;  Service: Orthopedics;  Laterality: Right;  Right Total Hip Arthroplasty   TOTAL KNEE ARTHROPLASTY Left 01/28/2015   Procedure: LEFT TOTAL KNEE ARTHROPLASTY;  Surgeon: [name], MD;  Location: [facility] ORS;  Service: Orthopedics;  Laterality: Left;   TRIGGER FINGER RELEASE Right    TROCHANTERIC BURSA EXCISION Right 05/03/2016   Dr. Doneen Poisson   WRIST ARTHROSCOPY Right    ligament tear    Family History  Problem Relation Age of Onset   Arthritis Mother    Hypertension Mother    Kidney disease Mother    Heart disease Mother    Stroke Mother    Cancer Father        Bladder   Arthritis Sister    Heart disease Sister    Colon cancer Other    Cancer - Lung Cousin    Arthritis Maternal Grandmother    Hearing loss Maternal Grandfather    Varicose Veins Maternal Uncle    Social History   Socioeconomic History   Marital status: Single    Spouse name: Not on file   Number of children: Not on file   Years of education: Not on file   Highest education level: 12th grade  Occupational History   Not on file  Tobacco Use   Smoking status: Former    Packs/day: 2.00    Years: 43.00    Additional pack years: 0.00    Total pack years: 86.00    Types: Cigarettes    Quit date: 02/26/2013    Years since quitting: 9.4    Passive exposure: Never   Smokeless tobacco: Never   Tobacco comments:    Vaping occasionally  Vaping Use   Vaping Use: Some days   Devices: uses about once per month  Substance and Sexual Activity   Alcohol use: No   Drug use: No   Sexual activity: Not Currently  Other Topics Concern   Not on file  Social History Narrative   Not on file   Social Determinants of Health   Financial Resource Strain: High Risk (08/23/2022)   Overall Financial Resource Strain (CARDIA)    Difficulty of Paying Living Expenses: Hard  Food Insecurity: Food Insecurity Present (08/23/2022)   Hunger Vital Sign    Worried About Running Out of Food in the Last Year: Often true    Ran Out of Food in the Last Year: Never true  Transportation Needs: No Transportation Needs (08/23/2022)   PRAPARE -  Administrator, Civil Service (Medical): No    Lack of Transportation (Non-Medical): No  Physical Activity: Sufficiently Active (08/23/2022)   Exercise Vital Sign    Days of Exercise per Week: 4 days    Minutes of Exercise per Session: 40 min  Recent Concern: Physical  Activity - Inactive (08/23/2022)   Exercise Vital Sign    Days of Exercise per Week: 0 days    Minutes of Exercise per Session: 0 min  Stress: Stress Concern Present (07/11/2022)   [employer] of Occupational Health - Occupational Stress Questionnaire    Feeling of Stress : Very much  Social Connections: Socially Isolated (08/23/2022)   Social Connection and Isolation Panel [NHANES]    Frequency of Communication with Friends and Family: Twice a week    Frequency of Social Gatherings with Friends and Family: Never    Attends Religious Services: Never    Database administrator or Organizations: No    Attends Engineer, structural: Never    Marital Status: Never married    Tobacco Counseling Counseling given: Not Answered Tobacco comments: Vaping occasionally   Clinical Intake:  Pre-visit preparation completed: Yes  Pain : No/denies pain     BMI - recorded: 35.55 Nutritional Risks: None Diabetes: No  How often do you need to have someone help you when you read instructions, pamphlets, or other written materials from your doctor or pharmacy?: 1 - Never  Interpreter Needed?: No  Information entered by :: C.Jensyn Shave LPN   Activities of Daily Living    08/21/2022    5:04 PM  In your present state of health, do you have any difficulty performing the following activities:  Hearing? 0  Vision? 0  Difficulty concentrating or making decisions? 0  Walking or climbing stairs? 1  Dressing or bathing? 1  Doing errands, shopping? 1  Preparing Food and eating ? Y  Using the Toilet? N  In the past six months, have you accidently leaked urine? Y  Do you have problems with loss of bowel control? N  Managing your Medications? N  Managing your Finances? N  Housekeeping or managing your Housekeeping? Y    Patient Care Team: Tower, Audrie Gallus, MD as PCP - Lorelle Gibbs, MD as Consulting Physician (Ophthalmology) Phillip Heal, MD as Referring Physician  (Psychiatry) Allie Bossier, MD as Consulting Physician (Obstetrics and Gynecology) Waymon Budge, MD as Consulting Physician (Pulmonary Disease) [name], MD as Consulting Physician (Orthopedic Surgery) [name], MD as Consulting Physician (Orthopedic Surgery) Tyrell Antonio, MD as Consulting Physician (Physical Medicine and Rehabilitation) Morton Peters, MD as Referring Physician (Otolaryngology) Doran Heater, PhD as Referring Physician (Psychology) Kathyrn Sheriff, [facility] (Inactive) as Pharmacist (Pharmacist)  Indicate any recent Medical Services you may have received from other than [facility] providers in the past year (date may be approximate).     Assessment:   This is a routine wellness examination for Lori Orozco.  Hearing/Vision screen Hearing Screening - Comments:: No hearing difficulty Vision Screening - Comments:: Glasses at night [name] - UTD on eye exams, has appointment 08/27/22  Dietary issues and exercise activities discussed:     Goals Addressed             This Visit's Progress    Patient Stated       Maintain current health status.       Depression Screen    08/23/2022   12:33 PM 07/13/2022   11:41 AM 08/02/2021  2:02 PM 07/11/2021    8:50 AM 07/10/2021    3:50 PM 05/16/2021    2:59 PM 07/08/2020    8:28 AM  PHQ 2/9 Scores  PHQ - 2 Score 0 6 3 0 3 0 0  PHQ- 9 Score 0 18  7 4       Fall Risk    08/23/2022   12:36 PM 08/21/2022    5:04 PM 07/13/2022   11:40 AM 08/02/2021    2:02 PM 07/11/2021    8:50 AM  Fall Risk   Falls in the past year? 1 1 1  0 0  Number falls in past yr: 1 1 1  0   Injury with Fall? 1 1 1  0   Comment back      Risk for fall due to : History of fall(s);Impaired balance/gait;Orthopedic patient;Impaired mobility  History of fall(s)    Follow up Education provided;Falls prevention discussed;Falls evaluation completed  Falls evaluation completed  Falls evaluation completed    MEDICARE RISK AT  HOME:  Medicare Risk at Home - 08/23/22 1238     Any stairs in or around the home? Yes    If so, are there any without handrails? No    Home free of loose throw rugs in walkways, pet beds, electrical cords, etc? Yes    Adequate lighting in your home to reduce risk of falls? Yes    Life alert? No    Use of a cane, walker or w/c? Yes   walker   Grab bars in the bathroom? Yes    Shower chair or bench in shower? Yes    Elevated toilet seat or a handicapped toilet? Yes             TIMED UP AND GO:  Was the test performed?  No    Cognitive Function:    06/30/2018    8:20 AM 06/18/2017    8:05 AM 05/09/2016    1:42 PM  MMSE - Mini Mental State Exam  Orientation to time 5 5 5   Orientation to Place 5 5 5   Registration 3 3 3   Attention/ Calculation 0 0 0  Recall 3 1 3   Recall-comments  unable to recall 2 of 3 words   Language- name 2 objects 0 0 0  Language- repeat 1 1 1   Language- follow 3 step command 0 3 3  Language- read & follow direction 0 0 0  Write a sentence 0 0 0  Copy design 0 0 0  Total score 17 18 20         08/23/2022   12:39 PM 07/10/2021    3:55 PM  6CIT Screen  What Year? 0 points 0 points  What month? 0 points 0 points  What time? 0 points 0 points  Count back from 20 0 points 0 points  Months in reverse 0 points 0 points  Repeat phrase 0 points 0 points  Total Score 0 points 0 points    Immunizations Immunization History  Administered Date(s) Administered   Influenza Split 11/19/2011, 11/02/2013   Influenza Whole 12/20/2006, 11/25/2008, 11/26/2009, 11/24/2010   Influenza, High Dose Seasonal PF 12/13/2016   Influenza,inj,Quad PF,6+ Mos 12/06/2015, 11/28/2021   Influenza-Unspecified 12/08/2012, 12/03/2014, 12/08/2017, 11/10/2018, 10/31/2019   Moderna Sars-Covid-2 Vaccination 06/21/2019, 07/19/2019, 01/22/2020   PFIZER(Purple Top)SARS-COV-2 Vaccination 06/04/2020   Pfizer Covid-19 Vaccine Bivalent Booster 83yrs & up 12/29/2021   Pneumococcal  Conjugate-13 12/06/2015   Pneumococcal Polysaccharide-23 01/26/2002, 03/23/2008, 09/07/2010, 06/28/2017   Respiratory Syncytial Virus Vaccine,Recomb Aduvanted(Arexvy)  12/29/2021   Td 06/26/2000, 10/19/2011, 02/14/2022   Zoster Recombinat (Shingrix) 09/16/2017, 08/11/2022    TDAP status: Up to date  Flu Vaccine status: Up to date  Pneumococcal vaccine status: Up to date  Covid-19 vaccine status: Information provided on how to obtain vaccines.   Qualifies for Shingles Vaccine? Yes   Zostavax completed No   Shingrix Completed?: Yes  Screening Tests Health Maintenance  Topic Date Due   COVID-19 Vaccine (6 - 2023-24 season) 08/29/2022 (Originally 02/23/2022)   Lung Cancer Screening  01/16/2023 (Originally 12/14/2020)   INFLUENZA VACCINE  09/27/2022   MAMMOGRAM  01/11/2023   Fecal DNA (Cologuard)  07/26/2023   Medicare Annual Wellness (AWV)  08/23/2023   DTaP/Tdap/Td (4 - Tdap) 02/15/2032   Pneumonia Vaccine 55+ Years old  Completed   DEXA SCAN  Completed   Hepatitis C Screening  Completed   Zoster Vaccines- Shingrix  Completed   HPV VACCINES  Aged Out   Colonoscopy  Discontinued    Health Maintenance  There are no preventive care reminders to display for this patient.   Colorectal cancer screening: No longer required.   Mammogram status: Completed 07/30/22. Repeat every year  Bone Density status: Completed 08/08/22. Results reflect: Bone density results: OSTEOPENIA. Repeat every 2 years.  Lung Cancer Screening: (Low Dose CT Chest recommended if Age 21-80 years, 20 pack-year currently smoking OR have quit w/in 15years.) does not qualify.   Lung Cancer Screening Referral: no  Additional Screening:  Hepatitis C Screening: does qualify; Completed 09/20/20  Vision Screening: Recommended annual ophthalmology exams for early detection of glaucoma and other disorders of the eye. Is the patient up to date with their annual eye exam?  Yes  Who is the provider or what is the  name of the office in which the patient attends annual eye exams? [name] If pt is not established with a provider, would they like to be referred to a provider to establish care? Yes .   Dental Screening: Recommended annual dental exams for proper oral hygiene   Community Resource Referral / Chronic Care Management: CRR required this visit?  No   CCM required this visit?  No     Plan:     I have personally reviewed and noted the following in the patient's chart:   Medical and social history Use of alcohol, tobacco or illicit drugs  Current medications and supplements including opioid prescriptions. Patient is currently taking opioid prescriptions. Information provided to patient regarding non-opioid alternatives. Patient advised to discuss non-opioid treatment plan with their provider. Functional ability and status Nutritional status Physical activity Advanced directives List of other physicians Hospitalizations, surgeries, and ER visits in previous 12 months Vitals Screenings to include cognitive, depression, and falls Referrals and appointments  In addition, I have reviewed and discussed with patient certain preventive protocols, quality metrics, and best practice recommendations. A written personalized care plan for preventive services as well as general preventive health recommendations were provided to patient.     Maryan Puls, LPN   1/60/1093   After Visit Summary: (MyChart) Due to this being a telephonic visit, the after visit summary with patients personalized plan was offered to patient via MyChart   Nurse Notes: Pt experiencing financial insecurities. Referral declined. Pt stated she is on food stamps and on the waiting list for meals on wheels. Pt will call if needs change.

## 2022-08-23 NOTE — Patient Instructions (Signed)
Lori Orozco , Thank you for taking time to come for your Medicare Wellness Visit. I appreciate your ongoing commitment to your health goals. Please review the following plan we discussed and let me know if I can assist you in the future.   These are the goals we discussed:  Goals      DIET - EAT MORE FRUITS AND VEGETABLES     Follow up with Primary Care Provider     Starting 06/30/18, I will continue to take medications as prescribed and to keep appointments with PCP as scheduled.      Patient Stated     Maintain current health status.        This is a list of the screening recommended for you and due dates:  Health Maintenance  Topic Date Due   COVID-19 Vaccine (6 - 2023-24 season) 08/29/2022*   Screening for Lung Cancer  01/16/2023*   Flu Shot  09/27/2022   Mammogram  01/11/2023   Cologuard (Stool DNA test)  07/26/2023   Medicare Annual Wellness Visit  08/23/2023   DTaP/Tdap/Td vaccine (4 - Tdap) 02/15/2032   Pneumonia Vaccine  Completed   DEXA scan (bone density measurement)  Completed   Hepatitis C Screening  Completed   Zoster (Shingles) Vaccine  Completed   HPV Vaccine  Aged Out   Colon Cancer Screening  Discontinued  *Topic was postponed. The date shown is not the original due date.   Managing Pain Without Opioids Opioids are strong medicines used to treat moderate to severe pain. For some people, especially those who have long-term (chronic) pain, opioids may not be the best choice for pain management due to: Side effects like nausea, constipation, and sleepiness. The risk of addiction (opioid use disorder). The longer you take opioids, the greater your risk of addiction. Pain that lasts for more than 3 months is called chronic pain. Managing chronic pain usually requires more than one approach and is often provided by a team of health care providers working together (multidisciplinary approach). Pain management may be done at a pain management center or pain  clinic. How to manage pain without the use of opioids Use non-opioid medicines Non-opioid medicines for pain may include: Over-the-counter or prescription non-steroidal anti-inflammatory drugs (NSAIDs). These may be the first medicines used for pain. They work well for muscle and bone pain, and they reduce swelling. Acetaminophen. This over-the-counter medicine may work well for milder pain but not swelling. Antidepressants. These may be used to treat chronic pain. A certain type of antidepressant (tricyclics) is often used. These medicines are given in lower doses for pain than when used for depression. Anticonvulsants. These are usually used to treat seizures but may also reduce nerve (neuropathic) pain. Muscle relaxants. These relieve pain caused by sudden muscle tightening (spasms). You may also use a pain medicine that is applied to the skin as a patch, cream, or gel (topical analgesic), such as a numbing medicine. These may cause fewer side effects than medicines taken by mouth. Do certain therapies as directed Some therapies can help with pain management. They include: Physical therapy. You will do exercises to gain strength and flexibility. A physical therapist may teach you exercises to move and stretch parts of your body that are weak, stiff, or painful. You can learn these exercises at physical therapy visits and practice them at home. Physical therapy may also involve: Massage. Heat wraps or applying heat or cold to affected areas. Electrical signals that interrupt pain signals (transcutaneous electrical nerve  stimulation, TENS). Weak lasers that reduce pain and swelling (low-level laser therapy). Signals from your body that help you learn to regulate pain (biofeedback). Occupational therapy. This helps you to learn ways to function at home and work with less pain. Recreational therapy. This involves trying new activities or hobbies, such as a physical activity or drawing. Mental  health therapy, including: Cognitive behavioral therapy (CBT). This helps you learn coping skills for dealing with pain. Acceptance and commitment therapy (ACT) to change the way you think and react to pain. Relaxation therapies, including muscle relaxation exercises and mindfulness-based stress reduction. Pain management counseling. This may be individual, family, or group counseling.  Receive medical treatments Medical treatments for pain management include: Nerve block injections. These may include a pain blocker and anti-inflammatory medicines. You may have injections: Near the spine to relieve chronic back or neck pain. Into joints to relieve back or joint pain. Into nerve areas that supply a painful area to relieve body pain. Into muscles (trigger point injections) to relieve some painful muscle conditions. A medical device placed near your spine to help block pain signals and relieve nerve pain or chronic back pain (spinal cord stimulation device). Acupuncture. Follow these instructions at home Medicines Take over-the-counter and prescription medicines only as told by your health care provider. If you are taking pain medicine, ask your health care providers about possible side effects to watch out for. Do not drive or use heavy machinery while taking prescription opioid pain medicine. Lifestyle  Do not use drugs or alcohol to reduce pain. If you drink alcohol, limit how much you have to: 0-1 drink a day for women who are not pregnant. 0-2 drinks a day for men. Know how much alcohol is in a drink. In the [place] one drink equals one 12 oz bottle of beer (355 mL), one 5 oz glass of wine (148 mL), or one 1 oz glass of hard liquor (44 mL). Do not use any products that contain nicotine or tobacco. These products include cigarettes, chewing tobacco, and vaping devices, such as e-cigarettes. If you need help quitting, ask your health care provider. Eat a healthy diet and maintain a healthy  weight. Poor diet and excess weight may make pain worse. Eat foods that are high in fiber. These include fresh fruits and vegetables, whole grains, and beans. Limit foods that are high in fat and processed sugars, such as fried and sweet foods. Exercise regularly. Exercise lowers stress and may help relieve pain. Ask your health care provider what activities and exercises are safe for you. If your health care provider approves, join an exercise class that combines movement and stress reduction. Examples include yoga and tai chi. Get enough sleep. Lack of sleep may make pain worse. Lower stress as much as possible. Practice stress reduction techniques as told by your therapist. General instructions Work with all your pain management providers to find the treatments that work best for you. You are an important member of your pain management team. There are many things you can do to reduce pain on your own. Consider joining an online or in-person support group for people who have chronic pain. Keep all follow-up visits. This is important. Where to find more information You can find more information about managing pain without opioids from: American Academy of Pain Medicine: painmed.org Institute for Chronic Pain: instituteforchronicpain.org American Chronic Pain Association: theacpa.org Contact a health care provider if: You have side effects from pain medicine. Your pain gets worse or does not  get better with treatments or home therapy. You are struggling with anxiety or depression. Summary Many types of pain can be managed without opioids. Chronic pain may respond better to pain management without opioids. Pain is best managed when you and a team of health care providers work together. Pain management without opioids may include non-opioid medicines, medical treatments, physical therapy, mental health therapy, and lifestyle changes. Tell your health care providers if your pain gets worse or  is not being managed well enough. This information is not intended to replace advice given to you by your health care provider. Make sure you discuss any questions you have with your health care provider. Document Revised: 05/25/2020 Document Reviewed: 05/25/2020 Elsevier Patient Education  2024 Elsevier Inc.   Advanced directives: on file in chart  Conditions/risks identified: Aim for 30 minutes of exercise or brisk walking, 6-8 glasses of water, and 5 servings of fruits and vegetables each day.   Next appointment: Follow up in one year for your annual wellness visit 08/27/23 @ 11:15 telephone call   Preventive Care 65 Years and Older, Female Preventive care refers to lifestyle choices and visits with your health care provider that can promote health and wellness. What does preventive care include? A yearly physical exam. This is also called an annual well check. Dental exams once or twice a year. Routine eye exams. Ask your health care provider how often you should have your eyes checked. Personal lifestyle choices, including: Daily care of your teeth and gums. Regular physical activity. Eating a healthy diet. Avoiding tobacco and drug use. Limiting alcohol use. Practicing safe sex. Taking low-dose aspirin every day. Taking vitamin and mineral supplements as recommended by your health care provider. What happens during an annual well check? The services and screenings done by your health care provider during your annual well check will depend on your age, overall health, lifestyle risk factors, and family history of disease. Counseling  Your health care provider may ask you questions about your: Alcohol use. Tobacco use. Drug use. Emotional well-being. Home and relationship well-being. Sexual activity. Eating habits. History of falls. Memory and ability to understand (cognition). Work and work Astronomer. Reproductive health. Screening  You may have the following tests  or measurements: Height, weight, and BMI. Blood pressure. Lipid and cholesterol levels. These may be checked every 5 years, or more frequently if you are over 47 years old. Skin check. Lung cancer screening. You may have this screening every year starting at age 38 if you have a 30-pack-year history of smoking and currently smoke or have quit within the past 15 years. Fecal occult blood test (FOBT) of the stool. You may have this test every year starting at age 24. Flexible sigmoidoscopy or colonoscopy. You may have a sigmoidoscopy every 5 years or a colonoscopy every 10 years starting at age 75. Hepatitis C blood test. Hepatitis B blood test. Sexually transmitted disease (STD) testing. Diabetes screening. This is done by checking your blood sugar (glucose) after you have not eaten for a while (fasting). You may have this done every 1-3 years. Bone density scan. This is done to screen for osteoporosis. You may have this done starting at age 44. Mammogram. This may be done every 1-2 years. Talk to your health care provider about how often you should have regular mammograms. Talk with your health care provider about your test results, treatment options, and if necessary, the need for more tests. Vaccines  Your health care provider may recommend certain vaccines, such  as: Influenza vaccine. This is recommended every year. Tetanus, diphtheria, and acellular pertussis (Tdap, Td) vaccine. You may need a Td booster every 10 years. Zoster vaccine. You may need this after age 45. Pneumococcal 13-valent conjugate (PCV13) vaccine. One dose is recommended after age 64. Pneumococcal polysaccharide (PPSV23) vaccine. One dose is recommended after age 94. Talk to your health care provider about which screenings and vaccines you need and how often you need them. This information is not intended to replace advice given to you by your health care provider. Make sure you discuss any questions you have with your  health care provider. Document Released: 03/11/2015 Document Revised: 11/02/2015 Document Reviewed: 12/14/2014 Elsevier Interactive Patient Education  2017 [employer].  Fall Prevention in the Home Falls can cause injuries. They can happen to people of all ages. There are many things you can do to make your home safe and to help prevent falls. What can I do on the outside of my home? Regularly fix the edges of walkways and driveways and fix any cracks. Remove anything that might make you trip as you walk through a door, such as a raised step or threshold. Trim any bushes or trees on the path to your home. Use bright outdoor lighting. Clear any walking paths of anything that might make someone trip, such as rocks or tools. Regularly check to see if handrails are loose or broken. Make sure that both sides of any steps have handrails. Any raised decks and porches should have guardrails on the edges. Have any leaves, snow, or ice cleared regularly. Use sand or salt on walking paths during winter. Clean up any spills in your garage right away. This includes oil or grease spills. What can I do in the bathroom? Use night lights. Install grab bars by the toilet and in the tub and shower. Do not use towel bars as grab bars. Use non-skid mats or decals in the tub or shower. If you need to sit down in the shower, use a plastic, non-slip stool. Keep the floor dry. Clean up any water that spills on the floor as soon as it happens. Remove soap buildup in the tub or shower regularly. Attach bath mats securely with double-sided non-slip rug tape. Do not have throw rugs and other things on the floor that can make you trip. What can I do in the bedroom? Use night lights. Make sure that you have a light by your bed that is easy to reach. Do not use any sheets or blankets that are too big for your bed. They should not hang down onto the floor. Have a firm chair that has side arms. You can use this for  support while you get dressed. Do not have throw rugs and other things on the floor that can make you trip. What can I do in the kitchen? Clean up any spills right away. Avoid walking on wet floors. Keep items that you use a lot in easy-to-reach places. If you need to reach something above you, use a strong step stool that has a grab bar. Keep electrical cords out of the way. Do not use floor polish or wax that makes floors slippery. If you must use wax, use non-skid floor wax. Do not have throw rugs and other things on the floor that can make you trip. What can I do with my stairs? Do not leave any items on the stairs. Make sure that there are handrails on both sides of the stairs and use  them. Fix handrails that are broken or loose. Make sure that handrails are as long as the stairways. Check any carpeting to make sure that it is firmly attached to the stairs. Fix any carpet that is loose or worn. Avoid having throw rugs at the top or bottom of the stairs. If you do have throw rugs, attach them to the floor with carpet tape. Make sure that you have a light switch at the top of the stairs and the bottom of the stairs. If you do not have them, ask someone to add them for you. What else can I do to help prevent falls? Wear shoes that: Do not have high heels. Have rubber bottoms. Are comfortable and fit you well. Are closed at the toe. Do not wear sandals. If you use a stepladder: Make sure that it is fully opened. Do not climb a closed stepladder. Make sure that both sides of the stepladder are locked into place. Ask someone to hold it for you, if possible. Clearly mark and make sure that you can see: Any grab bars or handrails. First and last steps. Where the edge of each step is. Use tools that help you move around (mobility aids) if they are needed. These include: Canes. Walkers. Scooters. Crutches. Turn on the lights when you go into a dark area. Replace any light bulbs as soon  as they burn out. Set up your furniture so you have a clear path. Avoid moving your furniture around. If any of your floors are uneven, fix them. If there are any pets around you, be aware of where they are. Review your medicines with your doctor. Some medicines can make you feel dizzy. This can increase your chance of falling. Ask your doctor what other things that you can do to help prevent falls. This information is not intended to replace advice given to you by your health care provider. Make sure you discuss any questions you have with your health care provider. Document Released: 12/09/2008 Document Revised: 07/21/2015 Document Reviewed: 03/19/2014 Elsevier Interactive Patient Education  2017 [employer].

## 2022-08-24 ENCOUNTER — Telehealth: Payer: Self-pay

## 2022-08-24 NOTE — Telephone Encounter (Signed)
Received a fax from [facility] inquiring about a refill for the patient's enbrel. After reviewing the chart, Dr. Dimple Casey sent in a refill on enbrel on 08/20/2022 and received a receipt from the pharmacy. Contacted [employer] and spoke to [place]. Selinda Eon we sent in a refill on 08/20/2022. Irving Burton states the patient's medication was shipped out and should get to the patient today. Irving Burton states the requests are automatically sent when the patient runs out of refills. Irving Burton states the original prescription was sent for three months with no refills. I advised that is correct and the patient should be good for three months. Irving Burton verbalized understanding.

## 2022-08-25 ENCOUNTER — Other Ambulatory Visit (HOSPITAL_COMMUNITY): Payer: Self-pay

## 2022-08-25 ENCOUNTER — Ambulatory Visit (INDEPENDENT_AMBULATORY_CARE_PROVIDER_SITE_OTHER): Payer: Medicare Other

## 2022-08-25 DIAGNOSIS — M5441 Lumbago with sciatica, right side: Secondary | ICD-10-CM | POA: Diagnosis not present

## 2022-08-25 DIAGNOSIS — G8929 Other chronic pain: Secondary | ICD-10-CM | POA: Diagnosis not present

## 2022-08-25 DIAGNOSIS — S22080S Wedge compression fracture of T11-T12 vertebra, sequela: Secondary | ICD-10-CM | POA: Diagnosis not present

## 2022-08-28 ENCOUNTER — Telehealth: Payer: Self-pay | Admitting: Family Medicine

## 2022-08-28 NOTE — Telephone Encounter (Signed)
Noted, will evaluate. 

## 2022-08-28 NOTE — Telephone Encounter (Signed)
Please triage if possible  Want to make sure she is ok with no signs and symptoms of concussion and no wound that needs repair

## 2022-08-28 NOTE — Telephone Encounter (Signed)
Unable to reach [place] with [facility] and left v/m for her to call office on 08/29/22 after 8 AM. Left v/m for pt to cb even after 5pm can be triaged by access nurse.

## 2022-08-28 NOTE — Telephone Encounter (Signed)
I spoke with pt who fell out of bed on 08/25/22; did not lose consciousness, pt has had no H/A,dizziness and no vision changes.pt does have 1" laceration per pt over lt eye; is not bleeding and no purulent drainage. Pt said her friend notes redness around the laceration and the laceration is still open.no fever. When pt fell she cleaned area, put neosporin and bandaid.pt scheduled appt with Allayne Gitelman NP on 08/29/22 at 2:20 (pt requested as late appt as possible) UC & ED precautions given and pt voiced understanding.sending note to Dr Milinda Antis as PCP and Allayne Gitelman NP. Pt is going to redress the cut.        Already spoke with pt;see above access note.

## 2022-08-28 NOTE — Telephone Encounter (Signed)
Joni Fears from [facility] called to let Dr. Milinda Antis that when she visited the pt on today, 7/2, the pt informed her that she fell off her bed over the weekend & hit the area above her eye. Joni Fears stated the pt didn't complain about any pain, but she does have a small laceration above her eye. Call back # [phone]

## 2022-08-29 ENCOUNTER — Other Ambulatory Visit (HOSPITAL_COMMUNITY): Payer: Self-pay

## 2022-08-29 ENCOUNTER — Ambulatory Visit (INDEPENDENT_AMBULATORY_CARE_PROVIDER_SITE_OTHER): Payer: Medicare Other | Admitting: Primary Care

## 2022-08-29 ENCOUNTER — Encounter: Payer: Self-pay | Admitting: Family Medicine

## 2022-08-29 ENCOUNTER — Encounter: Payer: Self-pay | Admitting: Primary Care

## 2022-08-29 ENCOUNTER — Telehealth: Payer: Self-pay

## 2022-08-29 VITALS — BP 124/68 | HR 86 | Temp 97.3°F | Ht 67.0 in | Wt 218.0 lb

## 2022-08-29 DIAGNOSIS — S0181XA Laceration without foreign body of other part of head, initial encounter: Secondary | ICD-10-CM | POA: Diagnosis not present

## 2022-08-29 NOTE — Telephone Encounter (Signed)
Pharmacy Patient Advocate Encounter   Received notification that prior authorization for [facility] 0.25MG /0.5ML auto-injectors is required/requested.   PA submitted to [facility] via CoverMyMeds Key or (Medicaid) confirmation # [id number] Status is pending

## 2022-08-29 NOTE — Patient Instructions (Signed)
Continue to keep the site clean and dry.  Call us if you develop headaches, dizziness, changes in vision. Call us if you notice redness/warmth/increased swelling around the cut site.  It was a pleasure meeting you!

## 2022-08-29 NOTE — Progress Notes (Signed)
Subjective:    Patient ID: Lori Orozco, female    DOB: October 14, 1950, 72 y.o.   MRN: [id number]  Fall Pertinent negatives include no fever or headaches.    Lori Orozco is a very pleasant 72 y.o. female patient of Dr. Milinda Antis with a history of AVM, hypertension, COPD, osteoarthritis, osteopenia, hypothyroidism, rheumatoid arthritis, anemia, pulmonary embolism, lower extremity weakness who presents today to discuss a fall and laceration.  Yesterday we were contacted by her home health agency who reported that the patient fell off of her bed a few days prior, hit her head above the left eye and sustained a laceration.  There was no loss of consciousness.  There was noted redness around the open laceration so she was advised to be seen.  Today she denies headaches, dizziness, visual changes, fevers, drainage. She is not managed on blood thinners or aspirin. She's been cleansing the site of her laceration and applying a bandage. She is working with home health physical therapy for imbalance and recurrent falls.   Her last tetanus vaccine was in 2023  Review of Systems  Constitutional:  Negative for fever.  Skin:  Positive for color change and wound.  Neurological:  Negative for dizziness and headaches.         Past Medical History:  Diagnosis Date   Allergy Hayfever   1958   Amaurosis fugax    Anemia    hx   Anxiety    Arthritis    Asthma    Cataract    Chronic fatigue syndrome    Chronic kidney disease    frequency, nephritis when 72 yrs old   COPD (chronic obstructive pulmonary disease) (HCC)    COVID-19    Depression    Diverticulitis    Emphysema of lung (HCC)    Fibromyalgia    GERD (gastroesophageal reflux disease)    occ   H/O hiatal hernia    History of bronchitis    Hyperlipidemia    Hypothyroidism    Interstitial cystitis    Irritable bowel syndrome    Lichen sclerosus    Lumbar herniated disc    Migraines    Motor nerve conduction block  11/2021   Pneumonia    PONV (postoperative nausea and vomiting)    Shortness of breath    exertion    Thyroid disease    Graves   Urinary frequency    Urinary tract infection    Vertigo     Social History   Socioeconomic History   Marital status: Single    Spouse name: Not on file   Number of children: Not on file   Years of education: Not on file   Highest education level: 12th grade  Occupational History   Not on file  Tobacco Use   Smoking status: Former    Packs/day: 2.00    Years: 43.00    Additional pack years: 0.00    Total pack years: 86.00    Types: Cigarettes    Quit date: 02/26/2013    Years since quitting: 9.5    Passive exposure: Never   Smokeless tobacco: Never   Tobacco comments:    Vaping occasionally  Vaping Use   Vaping Use: Some days   Devices: uses about once per month  Substance and Sexual Activity   Alcohol use: No   Drug use: No   Sexual activity: Not Currently  Other Topics Concern   Not on file  Social History Narrative   Not  on file   Social Determinants of Health   Financial Resource Strain: High Risk (08/23/2022)   Overall Financial Resource Strain (CARDIA)    Difficulty of Paying Living Expenses: Hard  Food Insecurity: Food Insecurity Present (08/23/2022)   Hunger Vital Sign    Worried About Running Out of Food in the Last Year: Often true    Ran Out of Food in the Last Year: Never true  Transportation Needs: No Transportation Needs (08/23/2022)   PRAPARE - Administrator, Civil Service (Medical): No    Lack of Transportation (Non-Medical): No  Physical Activity: Sufficiently Active (08/23/2022)   Exercise Vital Sign    Days of Exercise per Week: 4 days    Minutes of Exercise per Session: 40 min  Recent Concern: Physical Activity - Inactive (08/23/2022)   Exercise Vital Sign    Days of Exercise per Week: 0 days    Minutes of Exercise per Session: 0 min  Stress: Stress Concern Present (07/11/2022)   [employer]  of Occupational Health - Occupational Stress Questionnaire    Feeling of Stress : Very much  Social Connections: Socially Isolated (08/23/2022)   Social Connection and Isolation Panel [NHANES]    Frequency of Communication with Friends and Family: Twice a week    Frequency of Social Gatherings with Friends and Family: Never    Attends Religious Services: Never    Database administrator or Organizations: No    Attends Banker Meetings: Never    Marital Status: Never married  Intimate Partner Violence: Not At Risk (08/23/2022)   Humiliation, Afraid, Rape, and Kick questionnaire    Fear of Current or Ex-Partner: No    Emotionally Abused: No    Physically Abused: No    Sexually Abused: No    Past Surgical History:  Procedure Laterality Date   ABDOMINAL HYSTERECTOMY     bladder sugery      CAROTID DOPPLAR     COLONOSCOPY  05/26/2010   avms- otherwise nl , re check 10y   DEXA-OSTEOPENIA     DOPPLER ECHOCARDIOGRAPHY     elbow surgery     EPICONDYLITIS     EYE SURGERY Bilateral    cataracts   FOOT SURGERY Bilateral    I & D EXTREMITY Right 09/01/2020   Procedure: TYNOSYNOVECTOMY;  Surgeon: [name], MD;  Location: [facility] OR;  Service: Orthopedics;  Laterality: Right;   IMPLANTATION VAGAL NERVE STIMULATOR  03/2022   JOINT REPLACEMENT     KNEE SURGERY     Left cartilage   lipoma in second finger right hand     MOUTH SURGERY  08/22/2022   PLANTAR FASCIA SURGERY Left    REVERSE SHOULDER ARTHROPLASTY Left 02/02/2020   Procedure: LEFT REVERSE SHOULDER ARTHROPLASTY;  Surgeon: [name], MD;  Location: [facility] OR;  Service: Orthopedics;  Laterality: Left;   ROTATOR CUFF REPAIR Left 12/2017   TEMPOROMANDIBULAR JOINT SURGERY     TONSILLECTOMY     TOTAL HIP ARTHROPLASTY Right 03/20/2013   Procedure: Right TOTAL HIP ARTHROPLASTY;  Surgeon: [name], MD;  Location: [facility] OR;  Service: Orthopedics;  Laterality: Right;  Right Total Hip Arthroplasty   TOTAL KNEE  ARTHROPLASTY Left 01/28/2015   Procedure: LEFT TOTAL KNEE ARTHROPLASTY;  Surgeon: [name], MD;  Location: [facility] ORS;  Service: Orthopedics;  Laterality: Left;   TRIGGER FINGER RELEASE Right    TROCHANTERIC BURSA EXCISION Right 05/03/2016   Dr. Doneen Poisson   WRIST ARTHROSCOPY Right  ligament tear    Family History  Problem Relation Age of Onset   Arthritis Mother    Hypertension Mother    Kidney disease Mother    Heart disease Mother    Stroke Mother    Cancer Father        Bladder   Arthritis Sister    Heart disease Sister    Colon cancer Other    Cancer - Lung Cousin    Arthritis Maternal Grandmother    Hearing loss Maternal Grandfather    Varicose Veins Maternal Uncle     Allergies  Allergen Reactions   Lithium Anaphylaxis   Tegretol [Carbamazepine] Other (See Comments)    Fever and body aches (fever over 103)   Tricyclic Antidepressants Anaphylaxis    Other reaction(s): Unknown   Methotrexate Derivatives Other (See Comments)    Urinary retention and dizziness  Other reaction(s): Other UTI   Strawberry Extract Hives   Tapentadol Rash    PT ALLERGIC NYLON TAPE    Codeine Nausea Only    Makes pt stay awake   Cymbalta [Duloxetine Hcl] Other (See Comments)    Makes pt pass out    Erythromycin     abdominal pain   Lyrica [Pregabalin]     Felt faint   Neurontin [Gabapentin]     Passes  out   Rabeprazole Sodium     insomnia   Baclofen Nausea Only    Sever nausea   Duraprep [Antiseptic Products, Misc.] Itching and Rash    Tolerates Betadine    Penicillins Rash    Tolerated ANCEF 02/02/20  Has patient had a PCN reaction causing immediate rash, facial/tongue/throat swelling, SOB or lightheadedness with hypotension: Yes Has patient had a PCN reaction causing severe rash involving mucus membranes or skin necrosis: No Has patient had a PCN reaction that required hospitalization No Has patient had a PCN reaction occurring within the last 10  years: No If all of the above answers are "NO", then may proceed with Cephalosporin use.    Tape Rash    PT ALLERGIC NYLON TAPE     Current Outpatient Medications on File Prior to Visit  Medication Sig Dispense Refill   albuterol (VENTOLIN HFA) 108 (90 Base) MCG/ACT inhaler Inhale 1-2 puffs into the lungs every 4 (four) hours as needed for wheezing or shortness of breath. 1 each 0   Black Cohosh 40 MG CAPS Take 1 capsule (40 mg total) by mouth daily. 30 capsule 11   Budeson-Glycopyrrol-Formoterol (BREZTRI AEROSPHERE) 160-9-4.8 MCG/ACT AERO Inhale 2 puffs into the lungs 2 (two) times daily. 3 each 3   buPROPion (WELLBUTRIN XL) 150 MG 24 hr tablet Take 150 mg by mouth daily.     busPIRone (BUSPAR) 15 MG tablet Take 15 mg by mouth 2 (two) times daily before a meal.     cholecalciferol (VITAMIN D) 25 MCG (1000 UNIT) tablet Take 2,000 Units by mouth daily. 2 tablets daily     clobetasol cream (TEMOVATE) 0.05 % APPLY SMALL AMOUNT TO AFFECTED AREAS TWO TO THREE TIMES PER WEEK 30 g 4   cyanocobalamin 2000 MCG tablet Take 500 mcg by mouth daily.     diclofenac Sodium (VOLTAREN) 1 % GEL Apply 2 g topically 2 (two) times daily as needed (pain).     diphenhydrAMINE (BENADRYL) 25 MG tablet Take 25 mg by mouth every 6 (six) hours as needed for allergies.     divalproex (DEPAKOTE ER) 500 MG 24 hr tablet Take 500 mg by mouth at  bedtime.      etanercept (ENBREL SURECLICK) 50 MG/ML injection Inject 50 mg into the skin once a week. 12 mL 0   fluticasone (FLONASE) 50 MCG/ACT nasal spray PLACE 2 SPRAYS INTO THE NOSE DAILY. 16 g 11   HYDROcodone-acetaminophen (NORCO/VICODIN) 5-325 MG tablet Take 1 tablet by mouth 3 (three) times daily as needed for moderate pain. 30 tablet 0   ipratropium-albuterol (DUONEB) 0.5-2.5 (3) MG/3ML SOLN Take 3 mLs by nebulization every 6 (six) hours as needed. 120 mL prn   ketotifen (ZADITOR) 0.025 % ophthalmic solution Place 1 drop into both eyes 2 (two) times daily as needed  (allergies).     levothyroxine (SYNTHROID) 50 MCG tablet Take 1 tablet (50 mcg total) by mouth daily before breakfast. 90 tablet 1   LORazepam (ATIVAN) 1 MG tablet Take 1 mg by mouth at bedtime as needed for anxiety or sleep.     metoprolol succinate (TOPROL-XL) 25 MG 24 hr tablet TAKE 1/2 TABLET BY MOUTH EVERY MORNING AND EVERY NIGHT AT BEDTIME 90 tablet 1   mirabegron ER (MYRBETRIQ) 25 MG TB24 tablet Take 1 tablet (25 mg total) by mouth in the morning and at bedtime. 180 tablet 3   mirtazapine (REMERON) 30 MG tablet Take 30 mg by mouth at bedtime.      ondansetron (ZOFRAN-ODT) 4 MG disintegrating tablet Take 1 tablet (4 mg total) by mouth every 8 (eight) hours as needed for nausea or vomiting. 20 tablet 1   Rhubarb (ESTROVEN COMPLETE) 4 MG TABS Take 4 mg by mouth daily. 30 tablet 11   sertraline (ZOLOFT) 100 MG tablet Take 100 mg by mouth daily.      simvastatin (ZOCOR) 20 MG tablet Take 1 tablet (20 mg total) by mouth at bedtime. 90 tablet 3   Spacer/Aero-Holding Chambers (AEROCHAMBER PLUS) inhaler Use with inhaler 1 each 2   Semaglutide-Weight Management 0.25 MG/0.5ML SOAJ Inject 0.25 mg into the skin once a week. (Patient not taking: Reported on 08/29/2022) 2 mL 0   No current facility-administered medications on file prior to visit.    BP 124/68   Pulse 86   Temp (!) 97.3 F (36.3 C) (Temporal)   Ht 5\' 7"  (1.702 m)   Wt 218 lb (98.9 kg)   SpO2 92%   BMI 34.14 kg/m  Objective:   Physical Exam Eyes:     Comments: No periorbital or maxillary tenderness  Cardiovascular:     Rate and Rhythm: Normal rate and regular rhythm.  Pulmonary:     Effort: Pulmonary effort is normal.  Musculoskeletal:     Cervical back: Neck supple.  Skin:    General: Skin is warm and dry.     Findings: Bruising present.     Comments: 1 cm linear superficial laceration on the left side above her left upper eye brow. No drainage. No surrounding erythema. Light yellow bruising noted around wound.             Assessment & Plan:  Laceration of forehead, initial encounter Assessment & Plan: Uncomplicated.  No neurological or bony alarm signs.  Fortunately, the laceration is superficial. It does not qualify for suturing as the wound is >24 hours old and the wound does not appear to need suturing.  Continue conservative treatment with cleansing and application of dressings.  Continue with home health PT for balance and falls prevention. Tetanus vaccine UTD.         Doreene Nest, NP

## 2022-08-29 NOTE — Assessment & Plan Note (Addendum)
Uncomplicated.  No neurological or bony alarm signs.  Fortunately, the laceration is superficial. It does not qualify for suturing as the wound is >24 hours old and the wound does not appear to need suturing.  Continue conservative treatment with cleansing and application of dressings.  Continue with home health PT for balance and falls prevention. Tetanus vaccine UTD.

## 2022-08-29 NOTE — Telephone Encounter (Signed)
Denial letter placed in your inbox

## 2022-09-03 ENCOUNTER — Ambulatory Visit: Payer: Self-pay

## 2022-09-03 ENCOUNTER — Other Ambulatory Visit (HOSPITAL_COMMUNITY): Payer: Self-pay

## 2022-09-03 NOTE — Patient Instructions (Signed)
Visit Information  Thank you for taking time to visit with me today. Please don't hesitate to contact me if I can be of assistance to you.   Following are the goals we discussed today:   Goals Addressed             This Visit's Progress    assistance with community resources and management of chronic health conditions       Interventions Today    Flowsheet Row Most Recent Value  Chronic Disease   Chronic disease during today's visit Other  [rheumatoid arthritis, chronic back/leg pain/ falls]  General Interventions   General Interventions Discussed/Reviewed General Interventions Discussed, Doctor Visits, [employer] of current treatment plan for rheumatoid arthritis,chronic back/ leg pain/ falls and patients adherence to plan as established by provider. Assessed pain level. Referred to SW]  Doctor Visits Discussed/Reviewed Doctor Visits Discussed  [discussed scheduled/ upcoming. Confirmed patient has transportation for 09/04/22 doctor appointment.]  Education Interventions   Education Provided Provided Education  [Assessed patients ability to do ADL's, secure food and address housing concerns.]  Pharmacy Interventions   Pharmacy Dicussed/Reviewed Pharmacy Topics Discussed  [Medications reviewed and compliance discussed.]  Safety Interventions   Safety Discussed/Reviewed Fall Risk  [sent patient fall precaution article in Mychart.]              Our next appointment is by telephone on 10/04/22 at 2 pm  Please call the care guide team at [phone] if you need to cancel or reschedule your appointment.   If you are experiencing a Mental Health or Behavioral Health Crisis or need someone to talk to, please call the Suicide and Crisis Lifeline: 988 call 1-800-273-TALK (toll free, 24 hour hotline)  Patient verbalizes understanding of instructions and care plan provided today and agrees to view in MyChart. Active MyChart status and patient understanding of how to  access instructions and care plan via MyChart confirmed with patient.     George Ina RN,BSN,CCM [facility] Care Coordination [phone] direct line

## 2022-09-03 NOTE — Patient Outreach (Signed)
  Care Coordination   Initial Visit Note   09/03/2022 Name: Lori Orozco MRN: [id number] DOB: 09-Jan-1951  Lori Orozco is a 72 y.o. year old female who sees Tower, Audrie Gallus, MD for primary care. I spoke with  [name] by phone today.  What matters to the patients health and wellness today?  Patient reports having rheumatoid arthritis and chronic back/ leg pain.  She states she has severe arthritis in her hands. She reports requiring a walker, Rolator for ambulation. Patient reports having a few falls over the past year.  She states she is unable to cook for herself.  Patient states she currently lives in her home with a roommate who assists with her care.  Patient states roommate is planning to move out by October 2024 and she is planning to sell her home by that timeframe as well.    Goals Addressed             This Visit's Progress    assistance with community resources and management of chronic health conditions       Interventions Today    Flowsheet Row Most Recent Value  Chronic Disease   Chronic disease during today's visit Other  [rheumatoid arthritis, chronic back/leg pain/ falls]  General Interventions   General Interventions Discussed/Reviewed General Interventions Discussed, Doctor Visits, [employer] of current treatment plan for rheumatoid arthritis,chronic back/ leg pain/ falls and patients adherence to plan as established by provider. Assessed pain level. Referred to SW]  Doctor Visits Discussed/Reviewed Doctor Visits Discussed  [discussed scheduled/ upcoming. Confirmed patient has transportation for 09/04/22 doctor appointment.]  Education Interventions   Education Provided Provided Education  [Assessed patients ability to do ADL's, secure food and address housing concerns.]  Pharmacy Interventions   Pharmacy Dicussed/Reviewed Pharmacy Topics Discussed  [Medications reviewed and compliance discussed.]  Safety Interventions    Safety Discussed/Reviewed Fall Risk  [sent patient fall precaution article in Mychart.]              SDOH assessments and interventions completed:  Yes  SDOH Interventions Today    Flowsheet Row Most Recent Value  SDOH Interventions   Housing Interventions Intervention Not Indicated  Transportation Interventions Other (Comment)  [Referred to community resource]        Care Coordination Interventions:  Yes, provided   Follow up plan: Follow up call scheduled for 10/04/22    Encounter Outcome:  Pt. Visit Completed   George Ina RN,BSN,CCM [facility] Care Coordination [phone] direct line

## 2022-09-04 ENCOUNTER — Other Ambulatory Visit (INDEPENDENT_AMBULATORY_CARE_PROVIDER_SITE_OTHER): Payer: Medicare Other

## 2022-09-04 ENCOUNTER — Encounter: Payer: Self-pay | Admitting: Orthopedic Surgery

## 2022-09-04 ENCOUNTER — Ambulatory Visit (INDEPENDENT_AMBULATORY_CARE_PROVIDER_SITE_OTHER): Payer: Medicare Other | Admitting: Orthopedic Surgery

## 2022-09-04 VITALS — BP 145/84 | HR 76 | Ht 67.0 in | Wt 218.0 lb

## 2022-09-04 DIAGNOSIS — G8929 Other chronic pain: Secondary | ICD-10-CM | POA: Diagnosis not present

## 2022-09-04 DIAGNOSIS — M5441 Lumbago with sciatica, right side: Secondary | ICD-10-CM | POA: Diagnosis not present

## 2022-09-04 DIAGNOSIS — M5416 Radiculopathy, lumbar region: Secondary | ICD-10-CM

## 2022-09-04 NOTE — Progress Notes (Signed)
Orthopedic Spine Surgery Office Note  Assessment: Patient is a 72 y.o. female with chronic low back pain with more recent onset of right radiating leg pain.  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 Discussed that the conservative treatments include:  -activity modification  -physical therapy  -over the counter pain medications  -medrol dosepak  -lumbar steroid injections -Patient has tried PT, Tylenol, oral steroids -Recommended diagnostic/therapeutic right L5/S1 transforaminal injection -Patient should return to office in 4 weeks, x-rays at next visit: none   Patient expressed understanding of the plan and all questions were answered to the patient's satisfaction.   ___________________________________________________________________________   History:  Patient is a 72 y.o. female who presents today for lumbar spine.  Patient has had several years of low back pain.  She underwent a spinal cord stimulator placement for this low back pain.  She feels that the stimulator was helpful.  She still does have some back pain.  She comes in today though because she has had a more recent onset of right leg pain.  She is felt that for the last couple of months.  She feels it along the lateral aspect of the distal thigh and into the lateral and posterior aspect of the right leg.  She is not having any symptoms on the left side.  There is no trauma or injury that preceded the onset of her right leg pain.   Weakness: Yes, generally feels weak.  No specific weakness noted Symptoms of imbalance: Denies Paresthesias and numbness: Denies Bowel or bladder incontinence: Denies Saddle anesthesia: Denies  Treatments tried: PT, Tylenol, oral steroids  Review of systems: Denies fevers and chills, night sweats, unexplained weight loss, history of  cancer.  Has had pain that wakes her at night  Past medical history: Amaurosis fugax Anxiety/Depression COPD CKD Diverticulosis GERD Fibromyalgia Hypothyroidism Interstitial cystitis  HLD Irritable bowel syndrome Migraines Vertigo  Allergies: Lithium, Tegretol, TCAs, methotrexate, Cymbalta, codeine, tapentadol, erythromycin, Lyrica, gabapentin, rabeprazole, baclofen, DuraPrep, penicillins, adhesive tape  Past surgical history:  Hysterectomy Bladder surgery R arm I&D Vagal nerve stimulator Foot surgery Cataract surgery Plantar fascia release Left rTSA Left RCR R THA R hand lipoma excision Trigger finger release Tonsillectomy R trochanteric bursa excision Right wrist arthroscopy Spinal cord stimulator  Social history: Denies use of nicotine product (smoking, vaping, patches, smokeless) Alcohol use: Denies Denies recreational drug use   Physical Exam:  BMI of 34.1  General: no acute distress, appears stated age, walks with walker Neurologic: alert, answering questions appropriately, following commands Respiratory: unlabored breathing on room air, symmetric chest rise Psychiatric: appropriate affect, normal cadence to speech   MSK (spine):  -Strength exam      Left  Right EHL    4/5  4/5 TA    5/5  5/5 GSC    5/5  5/5 Knee extension  5/5  5/5 Hip flexion   5/5  5/5  -Sensory exam    Sensation intact to light touch in L3-S1 nerve distributions of bilateral lower extremities  -Achilles DTR: 1/4 on the left, 1/4 on the right -Patellar tendon DTR: 1/4 on the left, 1/4 on the right  -Straight leg raise: negative bilaterally -Femoral nerve stretch test: negative bilaterally -Clonus: no beats bilaterally  -Right hip exam: No pain through range of motion, negative Stinchfield, negative FABER  Imaging: XR of the lumbar spine from 09/04/2022  was independently reviewed and interpreted, showing spinal cord stimulator seen in the dorsal lumbar soft tissue.   Disc height loss at L4/5 and L5/S1.  No evidence of instability on flexion/extension views.  No fracture or dislocation seen.  MRI of the lumbar spine from 08/25/2022 was independently reviewed and interpreted, showing L5 inferior endplate fracture that appears subacute. Chronic T12 compression fracture is noted. Laminectomy defect at T12/L1. Right L5/S1 foraminal stenosis. No other significant stenosis seen. Significant fatty atrophy of her paraspinal muscles.    Patient name: Lori Orozco Patient MRN: [id number] Date of visit: 09/04/22

## 2022-09-05 ENCOUNTER — Ambulatory Visit: Payer: Self-pay

## 2022-09-09 NOTE — Progress Notes (Signed)
Office Visit Note  Patient: Lori Orozco             Date of Birth: 1951-01-13           MRN: [id number]             PCP: [name], MD Referring: Tower, Audrie Gallus, MD Visit Date: 09/10/2022   Subjective:  Follow-up (Patient states she recently had an MRI and that she has some pinched nerves around the bottom of her spine that is causing problems with her legs. Patient states she will go soon to get injections to see if that will help.)   History of Present Illness: Lori Orozco is a 72 y.o. female here for follow up for seronegative RA on Enbrel 50 mg Poyen weekly and prednisone 10 mg daily. She has significant pain in her back and legs interval workup indicating nerve impingement and is scheduled for injections with Dr. Alvester Morin for this. MRI also with vertebral fractures probably chronic. In her arms right wrist remains painful and decreased mobility worst on the ulnar side.  Previous HPI 05/30/22 Lori Orozco is a 72 y.o. female here for follow up for seronegative RA on Enbrel 50 mg subcu weekly sulfasalazine 1000 mg twice daily and prednisone 20 mg daily.  She was taking the medications without interruptions but started experiencing increased joint pain swelling and stiffness especially affecting her hands wrists and elbows since about 2 weeks ago.  She called about this and we increased her to 20 mg prednisone daily up from 10 mg and she has noticed improvement in symptoms especially her elbows.  No longer swollen and is able to tolerate resting her arms on a flat surface without severe pain.  She did suffer from upper respiratory symptoms last week but these have mostly resolved without any new medication needed.    Previous HPI 02/28/22 Lori Orozco is a 72 y.o. female here for follow up for seronegative rheumatoid arthritis on Enbrel 50 mg subcu weekly and prednisone 10 mg daily.  She had to stop taking the sulfasalazine since about 2 weeks ago due to running  out of the medication.  She had change with insurance status so having a dramatically increased cost for her medications at the moment that she is having to sort out.  She was tolerating the medicine okay.  She did not recall a noticeable difference in symptoms with starting the sulfasalazine but while off the medication has felt noticeably worse so thinks it was doing something.  Particularly the left wrist pain has been a lot better on treatment her right wrist continues to be bad before and after.  She saw Dr. Wynetta Emery for her neck recommendation was for physical therapy but she has not pursued this since and does not feel like she would tolerate this well based on previous therapy experiences.  She does have plans for trying the nerve stimulator treatment starting later this month.      Previous HPI 12/21/21 LEAR KAINA is a 72 y.o. 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  She continues to have pain and stiffness affecting her hands bilaterally although the severity of swelling has gotten notably better on the Enbrel treatment but is far from resolved.  Her biggest concern at the moment is about her neck pain concern of cervical radiculopathy.  We checked x-ray that showed considerable multilevel degenerative disease with no acute appearing bony abnormality.  She is followed up with her spine doctor with epidural block injection tried did not find it very beneficial at all.  She has discussed radiofrequency ablation as a neck step option for radicular pain.     Previous HPI 10/09/2021 Lori Orozco is a 72 y.o. female here for follow up for seronegative inflammatory arthritis on Enbrel 50 mg Shelly weekly and prednisone 10 mg daily. She tried tapering prednisone but had severe worsening symptoms at 5 mg dose. She increased back to 10 mg daily and symptoms improved somewhat but still a lot of pain and swelling in both hands and wrists. She has  ongoing back pain and stiffness and is seeing D.r Wynetta Emery for this with known significant lumbar spine arthritis. More recently new problem with sensitivity and stinging type pain throughout both arms with even light pressure such as showering water. She also has worsening weakness in her proximal legs unable to stand without using arms, which are limited by pain.     Previous HPI 07/05/2021 Lori Orozco is a 72 y.o. female here for follow up for seronegative inflammatory arthritis on prednisone 20 mg daily dose and enbrel 50mg  subcutaneous once weekly. She has seen a large improvement in joint inflammation since starting the Enbrel. She has pain and redness around the injection site lasting up to 3 days with each dose. Wrists continue to hurt and have decreased movement left wrist actually more symptomatic than right now. She is working with physical therapy on getting back to walking currently very weak and unstable due to illness and deconditioning. She had recent ED visit for COPD exacerbation finished antibiotics for this. No other recent infection issues.   Previous HPI 02/08/2021 Lori Orozco is a 72 y.o. 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  She has had major medical events since her last visit.  She developed increased knee pain and swelling on the right side.  However she went to the hospital due to developing more weakness and shortness of breath CT angiogram identified bilateral pulmonary emboli.  Treatment with anticoagulation led to significant gastrointestinal bleeding from previously unknown AVM.  This was treated also in the hospital but overall protracted complications lasted for about a month hospitalization.  During this time she experienced worsening joint pain and swelling and significant deconditioning and weakness.  She was discharged from the hospital to rehab facility she was not continued on any  steroid or other anti-inflammatory treatment reports joint pain and swelling affecting numerous joints in bilateral elbows wrists fingers and knees.  This is limited any significant progress with physical therapy so far.  She called back to clinic earlier this month with the symptoms and was restarted on a prednisone taper currently down to 15 mg daily.  She saw orthopedics clinic for right knee aspiration injection 2 days ago that is partially helping.   Previous HPI 11/15/20 TEIRRA BEHNKEN is a 72 y.o. female here for follow up with wrist wrist extensor tenosynovitis and presumed seronegative inflammatory arthritis also involving knee joints after starting methotrexate 15 mg PO weekly and tapering prednisone off. Her wrist is slightly improved but remains very painful and swollen with decreased mobility. After starting methotrexate she has noticed some episodes of dizziness and urinary retention and frequency. She stopped the prednisone without noticing much difference in symptoms so far.     09/20/20 MARDELLE RADZINSKI is a 72 y.o. female here for joint pain and  swelling of knees and wrists with severe tenosynovitis s/p tenosynovectomy on 09/01/20 with Dr. [name].  She had been feeling in her usual health until June she recalls onset of symptoms after using a gas leaf blower at her home during which time she experienced some mild right wrist pain.  She does not recall any particular injury event at this time.  However she experienced progressive ongoing increase in pain with swelling and stiffness in her right wrist especially with decreased range of motion and pain over the dorsal aspect.  This is evaluated at orthopedics clinic with aspiration that revealed just coagulated sample trial of steroid medication and only partially improved symptoms then continued worsening.  She was admitted to the hospital for tenosynovectomy that was performed without complication.  Intraoperatively reported extensive  inflammatory tissue debridement without any evidence concerning for infection.  During this hospitalization she also developed knee effusion which was aspirated demonstrating inflammatory synovial fluid with negative microscopy and cultures.  Is now almost 3 weeks since her surgery she continues experiencing severe pain intensity swelling around the right hand and wrist and has developed some skin blistering.     Labs reviewed 08/2020 Synovial fluid knee 4,525 WBCs 90% neutrophils ANA neg RF 14.2 CCP neg ESR 37 CRP 14.1 Uric acid 4.9   07/2020 Synovial fluid wrist clotted sample negative gram stain   Review of Systems  Constitutional:  Positive for fatigue.  HENT:  Positive for mouth dryness. Negative for mouth sores.   Eyes:  Positive for dryness.  Respiratory:  Positive for shortness of breath.   Cardiovascular:  Negative for chest pain and palpitations.  Gastrointestinal:  Positive for constipation and diarrhea. Negative for blood in stool.  Endocrine: Negative for increased urination.  Genitourinary:  Negative for involuntary urination.  Musculoskeletal:  Positive for joint pain, gait problem, joint pain, joint swelling, myalgias, muscle weakness, morning stiffness, muscle tenderness and myalgias.  Skin:  Negative for color change, rash, hair loss and sensitivity to sunlight.  Allergic/Immunologic: Negative for susceptible to infections.  Neurological:  Positive for dizziness and headaches.  Hematological:  Negative for swollen glands.  Psychiatric/Behavioral:  Positive for depressed mood and sleep disturbance. The patient is nervous/anxious.     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    Bilateral leg weakness 10/09/2021   Vitamin B12 deficiency 08/02/2021   Myofascial pain dysfunction syndrome 08/02/2021   Adverse effect of prednisone 07/11/2021   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   History of pulmonary embolism 12/08/2020   Arthritis of knee 12/06/2020   Bilateral knee pain 09/20/2020   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   High risk medication use 09/20/2020   Extensor tenosynovitis of right wrist 09/15/2020   S/P reverse total shoulder arthroplasty, 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   Lung nodule 01/02/2020   Viral URI with cough 03/13/2019   Status post arthroscopy of left shoulder 01/16/2018   Estrogen deficiency 06/28/2017   Medial epicondylitis, left elbow 04/29/2017   S/P arthroscopy of left shoulder 02/25/2017   Impingement syndrome of left shoulder 01/30/2017   Pedal edema 10/26/2016   Trochanteric bursitis, right hip 04/10/2016   Hypertension 09/02/2015   Urge incontinence 07/27/2015   Obesity (BMI 30-39.9) 04/28/2015   Osteoarthritis of left knee  01/28/2015   Status post total left knee replacement 01/28/2015   Vitamin D deficiency 04/27/2014   Seasonal and perennial allergic rhinitis 06/17/2013   S/P total hip arthroplasty 03/20/2013   Medicare annual wellness visit, subsequent 11/18/2012   History of falling 11/18/2012   Routine general medical examination at a health care facility 11/10/2012   Lichen sclerosus et atrophicus of the vulva 07/29/2012   Osteopenia 10/19/2011   Hypothyroidism 10/19/2011   ANEMIA 10/11/2009   Hereditary and idiopathic peripheral neuropathy 08/05/2009   Low back pain 08/05/2009   HAND PAIN, BILATERAL 08/05/2009   TREMOR 03/09/2008   MENOPAUSAL SYNDROME 10/09/2007   DEPRESSION, MAJOR 05/20/2007   BIPOLAR AFFECTIVE DISORDER 05/20/2007   Generalized anxiety disorder 05/20/2007   PERSONALITY DISORDER 05/20/2007   ESOPHAGEAL SPASM 05/20/2007   Diaphragmatic hernia 05/20/2007   AMAUROSIS FUGAX 05/07/2007    COPD mixed type (HCC) 03/27/2007   HYPERCHOLESTEROLEMIA, PURE 12/10/2006   SYMPTOM, SYNDROME, CHRONIC FATIGUE 12/10/2006    Past Medical History:  Diagnosis Date   Allergy Hayfever   1958   Amaurosis fugax    Anemia    hx   Anxiety    Arthritis    Asthma    Cataract    Chronic fatigue syndrome    Chronic kidney disease    frequency, nephritis when 72 yrs old   COPD (chronic obstructive pulmonary disease) (HCC)    COVID-19    Depression    Diverticulitis    Emphysema of lung (HCC)    Fibromyalgia    GERD (gastroesophageal reflux disease)    occ   H/O hiatal hernia    History of bronchitis    Hyperlipidemia    Hypothyroidism    Interstitial cystitis    Irritable bowel syndrome    Lichen sclerosus    Lumbar herniated disc    Migraines    Motor nerve conduction block 11/2021   Osteoporosis 09/10/2022   Pneumonia    PONV (postoperative nausea and vomiting)    Shortness of breath    exertion    Thyroid disease    Graves   Urinary frequency    Urinary tract infection    Vertigo     Family History  Problem Relation Age of Onset   Arthritis Mother    Hypertension Mother    Kidney disease Mother    Heart disease Mother    Stroke Mother    Cancer Father        Bladder   Arthritis Sister    Heart disease Sister    Colon cancer Other    Cancer - Lung Cousin    Arthritis Maternal Grandmother    Hearing loss Maternal Grandfather    Varicose Veins Maternal Uncle    Past Surgical History:  Procedure Laterality Date   ABDOMINAL HYSTERECTOMY     bladder sugery      CAROTID DOPPLAR     COLONOSCOPY  05/26/2010   avms- otherwise nl , re check 10y   DEXA-OSTEOPENIA     DOPPLER ECHOCARDIOGRAPHY     elbow surgery     EPICONDYLITIS     EYE SURGERY Bilateral    cataracts   FOOT SURGERY Bilateral    I & D EXTREMITY Right 09/01/2020   Procedure: TYNOSYNOVECTOMY;  Surgeon: [name], MD;  Location: [facility] OR;  Service: Orthopedics;  Laterality: Right;    IMPLANTATION VAGAL NERVE STIMULATOR  03/2022   JOINT REPLACEMENT     KNEE SURGERY     Left cartilage  lipoma in second finger right hand     MOUTH SURGERY  08/22/2022   PLANTAR FASCIA SURGERY Left    REVERSE SHOULDER ARTHROPLASTY Left 02/02/2020   Procedure: LEFT REVERSE SHOULDER ARTHROPLASTY;  Surgeon: [name], MD;  Location[facility];  Service: Orthopedics;  Laterality: Left;   ROTATOR CUFF REPAIR Left 12/2017   TEMPOROMANDIBULAR JOINT SURGERY     TONSILLECTOMY     TOTAL HIP ARTHROPLASTY Right 03/20/2013   Procedure: Right TOTAL HIP ARTHROPLASTY;  Surgeon: [name], MD;  Location: [facility] OR;  Service: Orthopedics;  Laterality: Right;  Right Total Hip Arthroplasty   TOTAL KNEE ARTHROPLASTY Left 01/28/2015   Procedure: LEFT TOTAL KNEE ARTHROPLASTY;  Surgeon: [name], MD;  Location: [facility] ORS;  Service: Orthopedics;  Laterality: Left;   TRIGGER FINGER RELEASE Right    TROCHANTERIC BURSA EXCISION Right 05/03/2016   Dr. Doneen Poisson   WRIST ARTHROSCOPY Right    ligament tear   Social History   Social History Narrative   Not on file   Immunization History  Administered Date(s) Administered   Influenza Split 11/19/2011, 11/02/2013   Influenza Whole 12/20/2006, 11/25/2008, 11/26/2009, 11/24/2010   Influenza, High Dose Seasonal PF 12/13/2016   Influenza,inj,Quad PF,6+ Mos 12/06/2015, 11/28/2021   Influenza-Unspecified 12/08/2012, 12/03/2014, 12/08/2017, 11/10/2018, 10/31/2019   Moderna Sars-Covid-2 Vaccination 06/21/2019, 07/19/2019, 01/22/2020   PFIZER(Purple Top)SARS-COV-2 Vaccination 06/04/2020   Pfizer Covid-19 Vaccine Bivalent Booster 57yrs & up 12/29/2021   Pneumococcal Conjugate-13 12/06/2015   Pneumococcal Polysaccharide-23 01/26/2002, 03/23/2008, 09/07/2010, 06/28/2017   Respiratory Syncytial Virus Vaccine,Recomb Aduvanted(Arexvy) 12/29/2021   Td 06/26/2000, 10/19/2011, 02/14/2022   Zoster Recombinant(Shingrix) 09/16/2017, 08/11/2022      Objective: Vital Signs: BP 131/75 (BP Location: Left Arm, Patient Position: Sitting, Cuff Size: Normal)   Pulse 82   Resp 14   Ht 5\' 7"  (1.702 m)   Wt 218 lb (98.9 kg)   BMI 34.14 kg/m    Physical Exam Eyes:     Conjunctiva/sclera: Conjunctivae normal.  Cardiovascular:     Rate and Rhythm: Normal rate and regular rhythm.  Pulmonary:     Effort: Pulmonary effort is normal.     Breath sounds: Normal breath sounds.  Lymphadenopathy:     Cervical: No cervical adenopathy.  Skin:    General: Skin is warm and dry.     Findings: Bruising present.  Neurological:     Mental Status: She is alert.  Psychiatric:        Mood and Affect: Mood normal.      Musculoskeletal Exam:  Neck full ROM no tenderness Shoulders full ROM no tenderness or swelling Elbows full ROM no tenderness or swelling Right wrist with ulnar subluxation, decreased ROM, tenderness to pressure Fingers full ROM chronic MCP joint  Right knee tenderness to pressure, crepitus, no palpable swelling   CDAI Exam: CDAI Score: 8  Patient Global: 30 / 100; Provider Global: 20 / 100 Swollen: 1 ; Tender: 2  Joint Exam 09/10/2022      Right  Left  Wrist  Swollen Tender     Knee   Tender          Investigation: No additional findings.  Imaging: XR Lumbar Spine Complete  Result Date: 09/04/2022 XR of the lumbar spine from 09/04/2022 was independently reviewed and interpreted, showing spinal cord stimulator seen in the dorsal lumbar soft tissue.  Disc height loss at L4/5 and L5/S1.  No evidence of instability on flexion/extension views.  No fracture or dislocation seen.  MR Lumbar  Spine w/o contrast  Result Date: 09/01/2022 CLINICAL DATA:  Low back pain, symptoms persist with greater than 6 weeks of treatment. Pain extends into the right hip and down the right leg for 3 months. Right lower extremity weakness. Multiple recent falls. EXAM: MRI LUMBAR SPINE WITHOUT CONTRAST TECHNIQUE: Multiplanar, multisequence MR  imaging of the lumbar spine was performed. No intravenous contrast was administered. COMPARISON:  Lumbar spine radiograph in/17/23. MRI of lumbar spine 09/28/2021. FINDINGS: Segmentation: 5 non rib-bearing lumbar type vertebral bodies are present. The lowest fully formed vertebral body is L5. Alignment: Slight retrolisthesis at L1-2 is stable. No other significant listhesis is present. Lumbar lordosis is normal. Vertebrae: A vertebral plana compression fracture at T12 is new. The vertebral body is reduced to 7 mm anteriorly. Minimal edematous changes are present along the superior endplate. An inferior endplate fracture at L5 demonstrates 40% loss of height. More moderate edema present throughout the vertebral body with some sparing of the superior endplate. Scattered fatty infiltration of the marrow is present in the remaining lumbar vertebral bodies. Vertebral body heights are otherwise normal. Conus medullaris and cauda equina: Conus extends to the T12-L1 level. Conus and cauda equina appear normal. Paraspinal and other soft tissues: Limited imaging the abdomen is unremarkable. There is no significant adenopathy. No solid organ lesions are present. Disc levels: T11-12: Slight retropulsed bone is present. Laminectomy present. Central canal is decompressed. Susceptibility artifact on the left may represent scar tissue. T12-L1: Laminectomy noted. No residual or recurrent stenosis is present. L1-2: Mild disc bulging is present. No significant stenosis is present. L2-3: Mild disc bulging is present. No significant stenosis is present. L3-4: Disc desiccation is present. No focal protrusion is present. Mild facet hypertrophy is present bilaterally. No significant stenosis is present. L1-2: Chronic loss of disc height is present. No focal protrusion or stenosis is present. L5-S1: A rightward disc protrusion is again noted. Moderate right foraminal narrowing has progressed. IMPRESSION: 1. Acute/subacute inferior endplate  fracture at L5 with 40% loss of height. 2. Vertebral plana compression fracture at T12 with 7 mm of anterior height loss. Minimal edematous changes are present along the superior endplate suggesting incomplete healing. 3. Laminectomy at T11-12 and T12-L1 without residual or recurrent stenosis. 4. 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 5. Mild disc bulging at L1-2, L2-3, and L3-4 without significant stenosis. Electronically Signed   By: Marin Roberts M.D.   On: 09/01/2022 21:01    Recent Labs: Lab Results  Component Value Date   WBC 8.7 09/10/2022   HGB 12.5 09/10/2022   PLT 245 09/10/2022   NA 141 09/10/2022   K 4.8 09/10/2022   CL 103 09/10/2022   CO2 28 09/10/2022   GLUCOSE 100 (H) 09/10/2022   BUN 27 (H) 09/10/2022   CREATININE 1.02 (H) 09/10/2022   BILITOT 0.3 09/10/2022   ALKPHOS 82 07/13/2022   AST 13 09/10/2022   ALT 13 09/10/2022   PROT 6.4 09/10/2022   ALBUMIN 3.8 07/13/2022   CALCIUM 9.5 09/10/2022   GFRAA >60 01/29/2015   QFTBGOLDPLUS NEGATIVE 10/09/2021    Speciality Comments: No specialty comments available.  Procedures:  No procedures performed Allergies: Lithium; Tegretol [carbamazepine]; Tricyclic antidepressants; Methotrexate derivatives; Strawberry extract; Tapentadol; Codeine; Cymbalta [duloxetine hcl]; Erythromycin; Lyrica [pregabalin]; Neurontin [gabapentin]; Rabeprazole sodium; Baclofen; Duraprep [antiseptic products, misc.]; Penicillins; and Tape   Assessment / Plan:     Visit Diagnoses: Seronegative rheumatoid arthritis (HCC) - Plan: Sedimentation rate, predniSONE (DELTASONE) 10 MG tablet  Inflammatory arthritis appears controlled  but remains on moderate dose of prednisone 10 mg daily. Will continue on Enbrle 50 mg Sattley weekly and prednisone for now.  Checking sedimentation rate for disease activity monitoring.  High risk medication use - Plan: COMPLETE METABOLIC PANEL WITH GFR, CBC with Differential/Platelet  Checking CBC and  CMP for medication monitoring on long temr use of Enbrel. Last quantiferon was negative. No serious infections. Adding osteoporosis treatment with known fractures and for GIOP.  Osteoporosis with current pathological fracture, unspecified osteoporosis type, sequela  Discussed her lumbar spine fractures indicate insufficiency also long term use of steroids. Plan ot start oral alendronate 70 mg weekly. Vitamin D checking in May was normal. Has some esophageal spasm and diaphragmatic hernia issues but no major reflux , eosphagitis, or severe symptoms for years. Discussed importance taking medication properly and would switch alternate route if having any issue. Discussed risks including hypocalcemia, atypical femoral fractures, osteonecrosis of the jaw.  Orders: Orders Placed This Encounter  Procedures   Sedimentation rate   COMPLETE METABOLIC PANEL WITH GFR   CBC with Differential/Platelet   Meds ordered this encounter  Medications   predniSONE (DELTASONE) 10 MG tablet    Sig: Take 1 tablet (10 mg total) by mouth daily with breakfast.    Dispense:  90 tablet    Refill:  0     Follow-Up Instructions: Return in about 3 months (around 12/11/2022) for RA on ENB/GC f/u 3mos.   Fuller Plan, MD  Note - This record has been created using [employer].  Chart creation errors have been sought, but may not always  have been located. Such creation errors do not reflect on  the standard of medical care.

## 2022-09-10 ENCOUNTER — Encounter: Payer: Self-pay | Admitting: Internal Medicine

## 2022-09-10 ENCOUNTER — Ambulatory Visit: Payer: Medicare Other | Attending: Internal Medicine | Admitting: Internal Medicine

## 2022-09-10 ENCOUNTER — Telehealth: Payer: Self-pay | Admitting: Internal Medicine

## 2022-09-10 VITALS — BP 131/75 | HR 82 | Resp 14 | Ht 67.0 in | Wt 218.0 lb

## 2022-09-10 DIAGNOSIS — M8000XS Age-related osteoporosis with current pathological fracture, unspecified site, sequela: Secondary | ICD-10-CM | POA: Diagnosis not present

## 2022-09-10 DIAGNOSIS — M0600 Rheumatoid arthritis without rheumatoid factor, unspecified site: Secondary | ICD-10-CM | POA: Diagnosis not present

## 2022-09-10 DIAGNOSIS — M810 Age-related osteoporosis without current pathological fracture: Secondary | ICD-10-CM

## 2022-09-10 DIAGNOSIS — Z79899 Other long term (current) drug therapy: Secondary | ICD-10-CM | POA: Insufficient documentation

## 2022-09-10 HISTORY — DX: Age-related osteoporosis without current pathological fracture: M81.0

## 2022-09-10 LAB — CBC WITH DIFFERENTIAL/PLATELET
Basophils Absolute: 9 cells/uL (ref 0–200)
Basophils Relative: 0.1 %
Eosinophils Relative: 0.1 %
MPV: 10.5 fL (ref 7.5–12.5)
Monocytes Relative: 5.3 %
WBC: 8.7 10*3/uL (ref 3.8–10.8)

## 2022-09-10 MED ORDER — PREDNISONE 10 MG PO TABS
10.0000 mg | ORAL_TABLET | Freq: Every day | ORAL | 0 refills | Status: DC
Start: 2022-09-10 — End: 2023-01-22

## 2022-09-10 NOTE — Patient Outreach (Signed)
  Care Coordination   Initial Visit Note Note from 09/05/22 entered late  09/10/2022 Name: Lori Orozco MRN: [id number] DOB: 1951-01-18  Lori Orozco is a 72 y.o. year old female who sees Lori Orozco, Lori Gallus, MD for primary care. I spoke with  Lori Orozco by phone today.  What matters to the patients health and wellness today?  Patient lives with a roommate who is moving in October.  Patient plans to sell her home.  Patient needs help with bathing and food preparation.  Her sister is an option for housing.  Patient wants to consider Assisted Living once she sells her home.  Patient wants to be in [place] near family.      Goals Addressed             This Visit's Progress    Housing and Food resources       Care Coordination Interventions: Patient request help with food banks in [facility] Patient wants to move back to [facility] Patient to contact her sister regarding moving in together Patient to contact medical insurance about extra benefit options Patient to contact [employer]alt[employer] to start process to sell her home         SDOH assessments and interventions completed:  Yes     Care Coordination Interventions:  Yes, provided  Interventions Today    Flowsheet Row Most Recent Value  General Interventions   General Interventions Discussed/Reviewed General Interventions Discussed, Community Resources  [Food Referral and Assisted Living list to be mailed.]        Follow up plan: Follow up call scheduled for 09/19/22 at 1pm    Encounter Outcome:  Pt. Visit Completed

## 2022-09-10 NOTE — Telephone Encounter (Signed)
Patient advised prescription for prednisone has been sent to the pharmacy.  ?

## 2022-09-10 NOTE — Patient Instructions (Signed)
Alendronate Tablets What is this medication? ALENDRONATE (a LEN droe nate) prevents and treats osteoporosis. It may also be used to treat Paget disease of the bone. It works by Interior and spatial designer stronger and less likely to break (fracture). It belongs to a group of medications called bisphosphonates. This medicine may be used for other purposes; ask your health care provider or pharmacist if you have questions. COMMON BRAND NAME(S): Fosamax What should I tell my care team before I take this medication? They need to know if you have any of these conditions: Bleeding disorder Cancer Dental disease Difficulty swallowing Infection (fever, chills, cough, sore throat, pain or trouble passing urine) Kidney disease Low levels of calcium or other minerals in the blood Low red blood cell counts Receiving steroids like dexamethasone or prednisone Stomach or intestine problems Trouble sitting or standing for 30 minutes An unusual or allergic reaction to alendronate, other medications, foods, dyes or preservatives Pregnant or trying to get pregnant Breast-feeding How should I use this medication? Take this medication by mouth with a full glass of water. Take it as directed on the prescription label at the same time every day. Take the dose right after waking up. Do not eat or drink anything before taking it. Do not take it with any other drink except water. Do not chew or crush the tablet. After taking it, do not eat breakfast, drink, or take any other medications or vitamins for at least 30 minutes. Sit or stand up for at least 30 minutes after you take it. Do not lie down. Keep taking it unless your care team tells you to stop. A special MedGuide will be given to you by the pharmacist with each prescription and refill. Be sure to read this information carefully each time. Talk to your care team about the use of this medication in children. Special care may be needed. Overdosage: If you think you have  taken too much of this medicine contact a poison control center or emergency room at once. NOTE: This medicine is only for you. Do not share this medicine with others. What if I miss a dose? If you take your medication once a day, skip it. Take your next dose at the scheduled time the next morning. Do not take two doses on the same day. If you take your medication once a week, take the missed dose on the morning after you remember. Do not take two doses on the same day. What may interact with this medication? Aluminum hydroxide Antacids Aspirin Calcium supplements Medications for inflammation like ibuprofen, naproxen, and others Iron supplements Magnesium supplements Vitamins with minerals This list may not describe all possible interactions. Give your health care provider a list of all the medicines, herbs, non-prescription drugs, or dietary supplements you use. Also tell them if you smoke, drink alcohol, or use illegal drugs. Some items may interact with your medicine. What should I watch for while using this medication? Visit your care team for regular checks on your progress. It may be some time before you see the benefit from this medication. Some people who take this medication have severe bone, joint, or muscle pain. This medication may also increase your risk for jaw problems or a broken thigh bone. Tell your care team right away if you have severe pain in your jaw, bones, joints, or muscles. Tell you care team if you have any pain that does not go away or that gets worse. Tell your dentist and dental surgeon that you are  taking this medication. You should not have major dental surgery while on this medication. See your dentist to have a dental exam and fix any dental problems before starting this medication. Take good care of your teeth while on this medication. Make sure you see your dentist for regular follow-up appointments. You should make sure you get enough calcium and vitamin D  while you are taking this medication. Discuss the foods you eat and the vitamins you take with your care team. You may need blood work done while you are taking this medication. What side effects may I notice from receiving this medication? Side effects that you should report to your care team as soon as possible: Allergic reactions--skin rash, itching, hives, swelling of the face, lips, tongue, or throat Low calcium level--muscle pain or cramps, confusion, tingling, or numbness in the hands or feet Osteonecrosis of the jaw--pain, swelling, or redness in the mouth, numbness of the jaw, poor healing after dental work, unusual discharge from the mouth, visible bones in the mouth Pain or trouble swallowing Severe bone, joint, or muscle pain Stomach bleeding--bloody or black, tar-like stools, vomiting blood or brown material that looks like coffee grounds Side effects that usually do not require medical attention (report to your care team if they continue or are bothersome): Constipation Diarrhea Nausea Stomach pain This list may not describe all possible side effects. Call your doctor for medical advice about side effects. You may report side effects to FDA at 1-800-FDA-1088. Where should I keep my medication? Keep out of the reach of children and pets. Store at room temperature between 15 and 30 degrees C (59 and 86 degrees F). Throw away any unused medication after the expiration date. NOTE: This sheet is a summary. It may not cover all possible information. If you have questions about this medicine, talk to your doctor, pharmacist, or health care provider.  2024 Elsevier/Gold Standard (2020-02-25 00:00:00)

## 2022-09-10 NOTE — Patient Outreach (Signed)
Duplicate/error

## 2022-09-10 NOTE — Telephone Encounter (Signed)
Pt is requesting a refill on Prednisone 10MG 

## 2022-09-10 NOTE — Patient Instructions (Signed)
Visit Information  Thank you for taking time to visit with me today. Please don't hesitate to contact me if I can be of assistance to you.   Following are the goals we discussed today:   Goals Addressed             This Visit's Progress    Housing and Food resources       Care Coordination Interventions: Patient request help with food banks in [facility] Patient wants to move back to [facility] Patient to contact her sister regarding moving in together Patient to contact medical insurance about extra benefit options Patient to contact [employer] to start process to sell her home         Our next appointment is by telephone on 09/19/22 at 1pm  Please call the care guide team at [phone] if you need to cancel or reschedule your appointment.   If you are experiencing a Mental Health or Behavioral Health Crisis or need someone to talk to, please call 911  Patient verbalizes understanding of instructions and care plan provided today and agrees to view in MyChart. Active MyChart status and patient understanding of how to access instructions and care plan via MyChart confirmed with patient.     Telephone follow up appointment with care management team member scheduled for:09/19/22 at 1pm  Lysle Morales, BSW Social Worker [facility] Care Management  [phone]

## 2022-09-11 ENCOUNTER — Telehealth: Payer: Self-pay | Admitting: Physical Medicine and Rehabilitation

## 2022-09-11 LAB — COMPLETE METABOLIC PANEL WITH GFR
AG Ratio: 1.6 (calc) (ref 1.0–2.5)
ALT: 13 U/L (ref 6–29)
AST: 13 U/L (ref 10–35)
Albumin: 3.9 g/dL (ref 3.6–5.1)
Alkaline phosphatase (APISO): 55 U/L (ref 37–153)
BUN/Creatinine Ratio: 26 (calc) — ABNORMAL HIGH (ref 6–22)
BUN: 27 mg/dL — ABNORMAL HIGH (ref 7–25)
CO2: 28 mmol/L (ref 20–32)
Calcium: 9.5 mg/dL (ref 8.6–10.4)
Chloride: 103 mmol/L (ref 98–110)
Creat: 1.02 mg/dL — ABNORMAL HIGH (ref 0.60–1.00)
Globulin: 2.5 g/dL (calc) (ref 1.9–3.7)
Glucose, Bld: 100 mg/dL — ABNORMAL HIGH (ref 65–99)
Potassium: 4.8 mmol/L (ref 3.5–5.3)
Sodium: 141 mmol/L (ref 135–146)
Total Bilirubin: 0.3 mg/dL (ref 0.2–1.2)
Total Protein: 6.4 g/dL (ref 6.1–8.1)
eGFR: 58 mL/min/{1.73_m2} — ABNORMAL LOW (ref >=60)

## 2022-09-11 LAB — CBC WITH DIFFERENTIAL/PLATELET
Absolute Monocytes: 461 cells/uL (ref 200–950)
Eosinophils Absolute: 9 cells/uL — ABNORMAL LOW (ref 15–500)
HCT: 38.9 % (ref 35.0–45.0)
Hemoglobin: 12.5 g/dL (ref 11.7–15.5)
Lymphs Abs: 2453 cells/uL (ref 850–3900)
MCH: 31.2 pg (ref 27.0–33.0)
MCHC: 32.1 g/dL (ref 32.0–36.0)
MCV: 97 fL (ref 80.0–100.0)
Neutro Abs: 5768 cells/uL (ref 1500–7800)
Neutrophils Relative %: 66.3 %
Platelets: 245 10*3/uL (ref 140–400)
RBC: 4.01 10*6/uL (ref 3.80–5.10)
RDW: 13.8 % (ref 11.0–15.0)
Total Lymphocyte: 28.2 %

## 2022-09-11 LAB — SEDIMENTATION RATE: Sed Rate: 11 mm/h (ref 0–30)

## 2022-09-11 NOTE — Telephone Encounter (Signed)
scheduled

## 2022-09-11 NOTE — Telephone Encounter (Signed)
Patient return call to schedule an appointment with Dr. Alvester Morin for an injection in her back. The number to contact patient is [phone]

## 2022-09-13 ENCOUNTER — Telehealth: Payer: Self-pay

## 2022-09-13 NOTE — Telephone Encounter (Signed)
Patient called to check status of fosamax prescription. Patient was seen on 09/10/2022 and had labs drawn. Patient would like rx sent to [facility] in [place]. Thanks!

## 2022-09-14 MED ORDER — ALENDRONATE SODIUM 70 MG PO TABS
70.0000 mg | ORAL_TABLET | ORAL | 1 refills | Status: DC
Start: 2022-09-14 — End: 2022-12-03

## 2022-09-14 NOTE — Telephone Encounter (Signed)
The level is about the same as it has been during the past year and is mild . I don't think she needs to do anything specific about this.  Staying well hydrated is good and not eating excessive amounts of salt are good ideas in general.

## 2022-09-14 NOTE — Telephone Encounter (Signed)
Patient advised The level is about the same as it has been during the past year and is mild. Dr. Dimple Casey does not think she needs to do anything specific about this. Staying well hydrated is good and not eating excessive amounts of salt are good ideas in general. Patient verbalized understanding.

## 2022-09-14 NOTE — Telephone Encounter (Signed)
Attempted to contact the patient and left a message to call the office back. 

## 2022-09-14 NOTE — Telephone Encounter (Signed)
Patient advised Alendronate Rx sent today. Labs look fine, her GFR of 58 is slightly in CKD stage 3a range but same as before.   Patient inquires if there is anything that can help with her kidneys, other than drinking water, to make her kidney level change and go down. Patient states she already drinks a lot of water. Patient call back number is [phone]. Please advise.

## 2022-09-14 NOTE — Telephone Encounter (Signed)
Alendronate Rx sent today. Labs look fine, her GFR of 58 is slightly in CKD stage 3a range but same as before.

## 2022-09-17 ENCOUNTER — Telehealth: Payer: Self-pay | Admitting: Family Medicine

## 2022-09-17 MED ORDER — LEVOTHYROXINE SODIUM 50 MCG PO TABS: 50.0000 ug | ORAL_TABLET | Freq: Every day | ORAL | 1 refills | Status: DC

## 2022-09-17 NOTE — Addendum Note (Signed)
Addended by: Shon Millet on: 09/17/2022 12:47 PM   Modules accepted: Orders

## 2022-09-17 NOTE — Telephone Encounter (Signed)
Prescription Request  09/17/2022  LOV: 07/16/2022  What is the name of the medication or equipment? levothyroxine (SYNTHROID) 50 MCG tablet  Have you contacted your pharmacy to request a refill? Yes   Which pharmacy would you like this sent to?  [id number] - [place], [place] - [address] [place] [address] Phone: [phone] Fax: [phone]   Patient notified that their request is being sent to the clinical staff for review and that they should receive a response within 2 business days.   Please advise at Mobile [phone] (mobile)

## 2022-09-19 ENCOUNTER — Ambulatory Visit: Payer: Self-pay

## 2022-09-19 NOTE — Patient Outreach (Signed)
  Care Coordination   Follow Up Visit Note   09/19/2022 Name: Lori Orozco MRN: [id number] DOB: 08-09-1950  Lori Orozco is a 72 y.o. year old female who sees Tower, Audrie Gallus, MD for primary care. I spoke with  [name] by phone today.  What matters to the patients health and wellness today?  Patient is preparing to sell her home so that she can move into an assisted living facility.  Patient will move in with her sister until she is able to find a suitable assisted living facility.      Goals Addressed             This Visit's Progress    Housing and Food resources       Care Coordination Interventions: Patient request help with food banks in [facility] Patient wants to move back to [facility] Patient to contact her sister regarding moving in together Patient to contact medical insurance about extra benefit options Patient to contact Realtor to start process to sell her home   Care Coordination Interventions: Patient plans to move in with her sister once her home is prepared for sale or is sold Patient will apply for Medicaid and Assisted Living once the home is sold Mindfulness or Relaxation training provided as patient is frustrated with the process to prepare her home to be sold an waiting 2 hrs for equipment to recharge.        SDOH assessments and interventions completed:  Yes     Care Coordination Interventions:  No, not indicated   Interventions Today    Flowsheet Row Most Recent Value  General Interventions   General Interventions Discussed/Reviewed General Interventions Reviewed, [employer] has not received the list of food banks in [place].  Patient request list of Assisted Living facilities in [place]]        Follow up plan: Follow up call scheduled for 11/12/22 at 1pm    Encounter Outcome:  Pt. Visit Completed

## 2022-09-19 NOTE — Patient Instructions (Signed)
Visit Information  Thank you for taking time to visit with me today. Please don't hesitate to contact me if I can be of assistance to you.   Following are the goals we discussed today:   Goals Addressed             This Visit's Progress    Housing and Food resources       Care Coordination Interventions: Patient request help with food banks in [facility] Patient wants to move back to [facility] Patient to contact her sister regarding moving in together Patient to contact medical insurance about extra benefit options Patient to contact Realtor to start process to sell her home   Care Coordination Interventions: Patient plans to move in with her sister once her home is prepared for sale or is sold Patient will apply for Medicaid and Assisted Living once the home is sold Mindfulness or Relaxation training provided as patient is frustrated with the process to prepare her home to be sold an waiting 2 hrs for equipment to recharge.        Our next appointment is by telephone on 11/12/22 at 1pm  Please call the care guide team at [phone] if you need to cancel or reschedule your appointment.   If you are experiencing a Mental Health or Behavioral Health Crisis or need someone to talk to, please call 911  Patient verbalizes understanding of instructions and care plan provided today and agrees to view in MyChart. Active MyChart status and patient understanding of how to access instructions and care plan via MyChart confirmed with patient.     Telephone follow up appointment with care management team member scheduled for: 11/12/22 at 1pm  .lbc

## 2022-09-20 ENCOUNTER — Encounter: Payer: Medicare Other | Admitting: Pharmacist

## 2022-09-20 ENCOUNTER — Ambulatory Visit: Payer: Medicare Other | Admitting: Orthopedic Surgery

## 2022-09-24 ENCOUNTER — Encounter: Payer: Medicare Other | Admitting: Pharmacist

## 2022-09-25 ENCOUNTER — Ambulatory Visit (INDEPENDENT_AMBULATORY_CARE_PROVIDER_SITE_OTHER): Payer: Medicare Other | Admitting: Physical Medicine and Rehabilitation

## 2022-09-25 ENCOUNTER — Other Ambulatory Visit: Payer: Self-pay

## 2022-09-25 VITALS — BP 153/90 | HR 80

## 2022-09-25 DIAGNOSIS — M5416 Radiculopathy, lumbar region: Secondary | ICD-10-CM | POA: Diagnosis not present

## 2022-09-25 MED ORDER — METHYLPREDNISOLONE ACETATE 80 MG/ML IJ SUSP
80.0000 mg | Freq: Once | INTRAMUSCULAR | Status: AC
Start: 2022-09-25 — End: 2022-09-25
  Administered 2022-09-25: 80 mg

## 2022-09-25 NOTE — Progress Notes (Unsigned)
Functional Pain Scale - descriptive words and definitions  Moderate (4)   Constantly aware of pain, can complete ADLs with modification/sleep marginally affected at times/passive distraction is of no use, but active distraction gives some relief. Moderate range order  Average Pain 3 but can increase   +Driver, -BT, -Dye Allergies.  Lower back pain on right side that radiates down the back of the right leg to the knee then down the side to the foot

## 2022-09-25 NOTE — Procedures (Unsigned)
Lumbosacral Transforaminal Epidural Steroid Injection - Sub-Pedicular Approach with Fluoroscopic Guidance  Patient: Lori Orozco      Date of Birth: Mar 17, 1950 MRN: [id number] PCP: [name], MD      Visit Date: 09/25/2022   Universal Protocol:    Date/Time: 09/25/2022  Consent Given By: the patient  Position: PRONE  Additional Comments: Vital signs were monitored before and after the procedure. Patient was prepped and draped in the usual sterile fashion. The correct patient, procedure, and site was verified.   Injection Procedure Details:   Procedure diagnoses: Lumbar radiculopathy [M54.16]    Meds Administered:  Meds ordered this encounter  Medications   methylPREDNISolone acetate (DEPO-MEDROL) injection 80 mg    Laterality: Right  Location/Site: L5  Needle:5.0 in., 22 ga.  Short bevel or Quincke spinal needle  Needle Placement: Transforaminal  Findings:    -Comments: Excellent flow of contrast along the nerve, nerve root and into the epidural space.  Procedure Details: After squaring off the end-plates to get a true AP view, the C-arm was positioned so that an oblique view of the foramen as noted above was visualized. The target area is just inferior to the "nose of the scotty dog" or sub pedicular. The soft tissues overlying this structure were infiltrated with 2-3 ml. of 1% Lidocaine without Epinephrine.  The spinal needle was inserted toward the target using a "trajectory" view along the fluoroscope beam.  Under AP and lateral visualization, the needle was advanced so it did not puncture dura and was located close the 6 O'Clock position of the pedical in AP tracterory. Biplanar projections were used to confirm position. Aspiration was confirmed to be negative for CSF and/or blood. A 1-2 ml. volume of Isovue-250 was injected and flow of contrast was noted at each level. Radiographs were obtained for documentation purposes.   After attaining the desired  flow of contrast documented above, a 0.5 to 1.0 ml test dose of 0.25% Marcaine was injected into each respective transforaminal space.  The patient was observed for 90 seconds post injection.  After no sensory deficits were reported, and normal lower extremity motor function was noted,   the above injectate was administered so that equal amounts of the injectate were placed at each foramen (level) into the transforaminal epidural space.   Additional Comments:  No complications occurred Dressing: 2 x 2 sterile gauze and Band-Aid    Post-procedure details: Patient was observed during the procedure. Post-procedure instructions were reviewed.  Patient left the clinic in stable condition.

## 2022-09-25 NOTE — Patient Instructions (Signed)

## 2022-09-27 NOTE — Progress Notes (Signed)
Lori Orozco - 72 y.o. female MRN [id number]  Date of birth: 02-26-1951  Office Visit Note: Visit Date: 09/25/2022 PCP: [name], MD Referred by: London Sheer, MD  Subjective: Chief Complaint  Patient presents with   Lower Back - Pain   HPI:  Lori Orozco is a 71 y.o. female who comes in today at the request of Dr. Willia Craze for planned Right L5-S1 Lumbar Transforaminal epidural steroid injection with fluoroscopic guidance.  The patient has failed conservative care including home exercise, medications, time and activity modification.  This injection will be diagnostic and hopefully therapeutic.  Please see requesting physician notes for further details and justification.  Interestingly since I have seen her in the past she has started to see Dr. Cherrie Distance at [facility].  She now has implanted Biotronik spinal cord stimulator.  I am pretty unfamiliar with this brand.  Depending on results with the injection and her continued treatment plan she probably would be better served to have all of her pain management performed by Dr. Cherrie Distance.   ROS Otherwise per HPI.  Assessment & Plan: Visit Diagnoses:    ICD-10-CM   1. Lumbar radiculopathy  M54.16 XR C-ARM NO REPORT    Epidural Steroid injection    methylPREDNISolone acetate (DEPO-MEDROL) injection 80 mg      Plan: No additional findings.   Meds & Orders:  Meds ordered this encounter  Medications   methylPREDNISolone acetate (DEPO-MEDROL) injection 80 mg    Orders Placed This Encounter  Procedures   XR C-ARM NO REPORT   Epidural Steroid injection    Follow-up: Return for visit to requesting provider as needed.   Procedures: No procedures performed  Lumbosacral Transforaminal Epidural Steroid Injection - Sub-Pedicular Approach with Fluoroscopic Guidance  Patient: Lori Orozco      Date of Birth: 1950/08/09 MRN: [id number] PCP: [name], MD      Visit  Date: 09/25/2022   Universal Protocol:    Date/Time: 09/25/2022  Consent Given By: the patient  Position: PRONE  Additional Comments: Vital signs were monitored before and after the procedure. Patient was prepped and draped in the usual sterile fashion. The correct patient, procedure, and site was verified.   Injection Procedure Details:   Procedure diagnoses: Lumbar radiculopathy [M54.16]    Meds Administered:  Meds ordered this encounter  Medications   methylPREDNISolone acetate (DEPO-MEDROL) injection 80 mg    Laterality: Right  Location/Site: L5  Needle:5.0 in., 22 ga.  Short bevel or Quincke spinal needle  Needle Placement: Transforaminal  Findings:    -Comments: Excellent flow of contrast along the nerve, nerve root and into the epidural space.  Procedure Details: After squaring off the end-plates to get a true AP view, the C-arm was positioned so that an oblique view of the foramen as noted above was visualized. The target area is just inferior to the "nose of the scotty dog" or sub pedicular. The soft tissues overlying this structure were infiltrated with 2-3 ml. of 1% Lidocaine without Epinephrine.  The spinal needle was inserted toward the target using a "trajectory" view along the fluoroscope beam.  Under AP and lateral visualization, the needle was advanced so it did not puncture dura and was located close the 6 O'Clock position of the pedical in AP tracterory. Biplanar projections were used to confirm position. Aspiration was confirmed to be negative for CSF and/or blood. A 1-2 ml. volume of Isovue-250 was injected and flow of  contrast was noted at each level. Radiographs were obtained for documentation purposes.   After attaining the desired flow of contrast documented above, a 0.5 to 1.0 ml test dose of 0.25% Marcaine was injected into each respective transforaminal space.  The patient was observed for 90 seconds post injection.  After no sensory deficits were  reported, and normal lower extremity motor function was noted,   the above injectate was administered so that equal amounts of the injectate were placed at each foramen (level) into the transforaminal epidural space.   Additional Comments:  No complications occurred Dressing: 2 x 2 sterile gauze and Band-Aid    Post-procedure details: Patient was observed during the procedure. Post-procedure instructions were reviewed.  Patient left the clinic in stable condition.    Clinical History: MRI LUMBAR SPINE WITHOUT CONTRAST   TECHNIQUE: Multiplanar, multisequence MR imaging of the lumbar spine was performed. No intravenous contrast was administered.   COMPARISON:  Lumbar spine radiograph in/17/23. MRI of lumbar spine 09/28/2021.   FINDINGS: Segmentation: 5 non rib-bearing lumbar type vertebral bodies are present. The lowest fully formed vertebral body is L5.   Alignment: Slight retrolisthesis at L1-2 is stable. No other significant listhesis is present. Lumbar lordosis is normal.   Vertebrae: A vertebral plana compression fracture at T12 is new. The vertebral body is reduced to 7 mm anteriorly. Minimal edematous changes are present along the superior endplate.   An inferior endplate fracture at L5 demonstrates 40% loss of height. More moderate edema present throughout the vertebral body with some sparing of the superior endplate.   Scattered fatty infiltration of the marrow is present in the remaining lumbar vertebral bodies. Vertebral body heights are otherwise normal.   Conus medullaris and cauda equina: Conus extends to the T12-L1 level. Conus and cauda equina appear normal.   Paraspinal and other soft tissues: Limited imaging the abdomen is unremarkable. There is no significant adenopathy. No solid organ lesions are present.   Disc levels:   T11-12: Slight retropulsed bone is present. Laminectomy present. Central canal is decompressed. Susceptibility artifact on  the left may represent scar tissue.   T12-L1: Laminectomy noted. No residual or recurrent stenosis is present.   L1-2: Mild disc bulging is present. No significant stenosis is present.   L2-3: Mild disc bulging is present. No significant stenosis is present.   L3-4: Disc desiccation is present. No focal protrusion is present. Mild facet hypertrophy is present bilaterally. No significant stenosis is present.   L1-2: Chronic loss of disc height is present. No focal protrusion or stenosis is present.   L5-S1: A rightward disc protrusion is again noted. Moderate right foraminal narrowing has progressed.   IMPRESSION: 1. Acute/subacute inferior endplate fracture at L5 with 40% loss of height. 2. Vertebral plana compression fracture at T12 with 7 mm of anterior height loss. Minimal edematous changes are present along the superior endplate suggesting incomplete healing. 3. Laminectomy at T11-12 and T12-L1 without residual or recurrent stenosis. 4. 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 5. Mild disc bulging at L1-2, L2-3, and L3-4 without significant stenosis.     Electronically Signed   By: Marin Roberts M.D.   On: 09/01/2022 21:01     Objective:  VS:  HT:    WT:   BMI:     BP:(!) 153/90  HR:80bpm  TEMP: ( )  RESP:  Physical Exam Vitals and nursing note reviewed.  Constitutional:      General: She is not in acute distress.  Appearance: Normal appearance. She is not ill-appearing.  HENT:     Head: Normocephalic and atraumatic.     Right Ear: External ear normal.     Left Ear: External ear normal.  Eyes:     Extraocular Movements: Extraocular movements intact.  Cardiovascular:     Rate and Rhythm: Normal rate.     Pulses: Normal pulses.  Pulmonary:     Effort: Pulmonary effort is normal. No respiratory distress.  Abdominal:     General: There is no distension.     Palpations: Abdomen is soft.  Musculoskeletal:        General:  Tenderness present.     Cervical back: Neck supple.     Right lower leg: No edema.     Left lower leg: No edema.     Comments: Patient has good distal strength with no pain over the greater trochanters.  No clonus or focal weakness.  Skin:    Findings: No erythema, lesion or rash.  Neurological:     General: No focal deficit present.     Mental Status: She is alert and oriented to person, place, and time.     Sensory: No sensory deficit.     Motor: No weakness or abnormal muscle tone.     Coordination: Coordination normal.  Psychiatric:        Mood and Affect: Mood normal.        Behavior: Behavior normal.      Imaging: No results found.

## 2022-10-03 ENCOUNTER — Ambulatory Visit (INDEPENDENT_AMBULATORY_CARE_PROVIDER_SITE_OTHER): Payer: Medicare Other | Admitting: Orthopedic Surgery

## 2022-10-03 DIAGNOSIS — M5416 Radiculopathy, lumbar region: Secondary | ICD-10-CM

## 2022-10-03 NOTE — Progress Notes (Signed)
Orthopedic Spine Surgery Office Note   Assessment: Patient is a 72 y.o. female with chronic low back pain with more recent onset of right radiating leg pain.  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 Got significant, but temporary relief with a L5/S1 injection     Plan: -Patient has tried PT, Tylenol, oral steroids, lumbar injection -Patient has tried over 6 weeks of conservative treat without any significant relief, so discussed operative management in the form of a right-sided L5/S1 foraminotomy.  After covering the surgery, patient elected to proceed. -Patient will like to be seen the date of surgery   The patient has symptoms consistent with lumbar radiculopathy. She has foraminal stenosis at L5/S1 on the right side and responded to a L5/S1 transforaminal injection. The patient's symptoms were not getting improvement with conservative treatment so operative management was discussed in the form of right-sided L5/S1 foraminotomy. The risks including but not limited to iatrogenic instability, dural tear, nerve root injury, paralysis, persistent pain, infection, bleeding, heart attack, death, stroke, fracture, and need for additional procedures were discussed with the patient. The benefit of the surgery would be improvement in the patient's symptoms radiating right leg pain. I explained that back pain relief is not the goal of the surgery and I do not expect her to get any reduction in that pain from the surgery. The alternatives to surgical management were covered with the patient and included continued monitoring, physical therapy, over-the-counter pain medications, ambulatory aids, repeat injections, pain management, and activity modification. All the patient's questions were answered to her satisfaction. After this discussion, the patient expressed understanding and elected to proceed with surgical intervention.      ___________________________________________________________________________      History:   Patient is a 72 y.o. female who presents today fo follow up on her lumbar spine.  Patient has a history of chronic low back pain and has a spinal cord stimulator in place.  She has developed right leg pain within the last year.  She feels it along the lateral aspect of the distal thigh and into the lateral and posterior aspect of the right leg.  After her last visit, she got an injection to the right L5/S1 foramen.  She noted significant improvement with that injection but it only lasted temporarily.  She said gave her good relief for 4 to 5 days.  Then, pain returned.  She does not have any symptoms on the left side.    Treatments tried: PT, Tylenol, oral steroids, lumbar injection     Physical Exam:   General: no acute distress, appears stated age, walks with walker Neurologic: alert, answering questions appropriately, following commands Respiratory: unlabored breathing on room air, symmetric chest rise Psychiatric: appropriate affect, normal cadence to speech     MSK (spine):   -Strength exam                                                   Left                  Right EHL                              4/5                  4/5 TA  5/5                  5/5 GSC                             5/5                  5/5 Knee extension            5/5                  5/5 Hip flexion                    5/5                  5/5   -Sensory exam                           Sensation intact to light touch in L3-S1 nerve distributions of bilateral lower extremities   -Achilles DTR: 1/4 on the left, 1/4 on the right -Patellar tendon DTR: 1/4 on the left, 1/4 on the right   -Straight leg raise: negative bilaterally -Femoral nerve stretch test: negative bilaterally -Clonus: no beats bilaterally    Imaging: XR of the lumbar spine from 09/04/2022 was previously independently reviewed and interpreted, showing spinal cord stimulator seen in the dorsal  lumbar soft tissue.  Disc height loss at L4/5 and L5/S1.  No evidence of instability on flexion/extension views.  No fracture or dislocation seen.   MRI of the lumbar spine from 08/25/2022 was previously independently reviewed and interpreted, showing L5 inferior endplate fracture that appears subacute. Chronic T12 compression fracture is noted. Laminectomy defect at T12/L1. Right L5/S1 foraminal stenosis. No other significant stenosis seen. Significant fatty atrophy of her paraspinal muscles.      Patient name: Lori Orozco Patient MRN: [id number] Date of visit: 10/03/22

## 2022-10-04 ENCOUNTER — Ambulatory Visit: Payer: Self-pay

## 2022-10-04 NOTE — Patient Instructions (Signed)
Visit Information  Thank you for taking time to visit with me today. Please don't hesitate to contact me if I can be of assistance to you.   Following are the goals we discussed today:   Goals Addressed             This Visit's Progress    assistance with community resources and management of chronic health conditions       Interventions Today    Flowsheet Row Most Recent Value  Chronic Disease   Chronic disease during today's visit Other  [rheumatoid arthritis, chronic back/ leg pain, falls.]  General Interventions   General Interventions Discussed/Reviewed General Interventions Reviewed, Doctor Visits, Communication with  [evaluation of current treatment plan and patients adherence to plan as established by provider. Assessed pain level]  Doctor Visits Discussed/Reviewed Doctor Visits Reviewed  Maeola Sarah provider appointments.  Advised to keep follow up appointments with providers.]  Communication with Social Work  Public house manager with Child psychotherapist regarding patients request for meals on wheel.]  Education Interventions   Education Provided Provided Education  [Advised to notify provider for any new or ongoing symptoms.]  Pharmacy Interventions   Pharmacy Dicussed/Reviewed Pharmacy Topics Reviewed  Annabell Sabal upcoming provider visit.  Discussed medication addition. Advised to take medications as prescribed.]  Safety Interventions   Safety Discussed/Reviewed Fall Risk  [Assessed for falls. Advised to make sure walkways are clear when moved into new living arrangements. Discussed fall precautions.]              Our next appointment is by telephone on 11/22/22 at 2 pm  Please call the care guide team at [phone] if you need to cancel or reschedule your appointment.   If you are experiencing a Mental Health or Behavioral Health Crisis or need someone to talk to, please call the Suicide and Crisis Lifeline: 988 call 1-800-273-TALK (toll free, 24 hour  hotline)  Patient verbalizes understanding of instructions and care plan provided today and agrees to view in MyChart. Active MyChart status and patient understanding of how to access instructions and care plan via MyChart confirmed with patient.     George Ina RN,BSN,CCM [facility] Care Coordination [phone] direct line

## 2022-10-04 NOTE — Patient Outreach (Signed)
  Care Coordination   Follow Up Visit Note   10/04/2022 Name: TYNNETTA PILECKI MRN: [id number] DOB: September 25, 1950  WAIVE MOUND is a 72 y.o. year old female who sees Tower, Audrie Gallus, MD for primary care. I spoke with  [name] by phone today.  What matters to the patients health and wellness today?  Patient states she will be moving to [place], [place] to live with her sister who will be able to assist her with her care on December 05, 2022.  Patient reports ongoing leg pain.  States current pain level is a 2 due to only sitting.  Patient reports having visit with orthopedic surgeon and plan is for back surgery in October 2024. She states specific date has not been set.  Patient states her rheumatologist started her on fosamax.  Patient denies falls since last telephone outreach with [facility].      Goals Addressed             This Visit's Progress    assistance with community resources and management of chronic health conditions       Interventions Today    Flowsheet Row Most Recent Value  Chronic Disease   Chronic disease during today's visit Other  [rheumatoid arthritis, chronic back/ leg pain, falls.]  General Interventions   General Interventions Discussed/Reviewed General Interventions Reviewed, Doctor Visits, Communication with  [evaluation of current treatment plan and patients adherence to plan as established by provider. Assessed pain level]  Doctor Visits Discussed/Reviewed Doctor Visits Reviewed  Maeola Sarah provider appointments.  Advised to keep follow up appointments with providers.]  Communication with Social Work  Public house manager with Child psychotherapist regarding patients request for meals on wheel.]  Education Interventions   Education Provided Provided Education  [Advised to notify provider for any new or ongoing symptoms.]  Pharmacy Interventions   Pharmacy Dicussed/Reviewed Pharmacy Topics Reviewed  Annabell Sabal upcoming provider visit.  Discussed medication  addition. Advised to take medications as prescribed.]  Safety Interventions   Safety Discussed/Reviewed Fall Risk  [Assessed for falls. Advised to make sure walkways are clear when moved into new living arrangements. Discussed fall precautions.]              SDOH assessments and interventions completed:  No     Care Coordination Interventions:  Yes, provided   Follow up plan: Follow up call scheduled for 11/22/22    Encounter Outcome:  Pt. Visit Completed   George Ina RN,BSN,CCM [facility] Care Coordination [phone] direct line

## 2022-10-11 ENCOUNTER — Telehealth: Payer: Self-pay | Admitting: *Deleted

## 2022-10-11 NOTE — Telephone Encounter (Signed)
Spoke to pt, scheduled pt for 8/23

## 2022-10-11 NOTE — Telephone Encounter (Signed)
Dr. Milinda Antis received surgical clearance forms for pt's ortho doc, per Dr. Milinda Antis pt needs surgical clearance appt., please schedule when able

## 2022-10-18 ENCOUNTER — Telehealth: Payer: Self-pay | Admitting: *Deleted

## 2022-10-18 NOTE — Telephone Encounter (Signed)
Patient states she was recently started on Fosamax last month. Patient states she woke up this morning and is having swelling at the knuckle on her third finger. Patient states she is also having pain and swelling in her right wrist. Patient states she is having slight swelling of the knuckles of her left hand. Patient states she is taking her Enbrel once weekly and Prednisone 10 mg daily. Please advise.

## 2022-10-19 ENCOUNTER — Encounter: Payer: Self-pay | Admitting: Family Medicine

## 2022-10-19 ENCOUNTER — Ambulatory Visit (INDEPENDENT_AMBULATORY_CARE_PROVIDER_SITE_OTHER): Payer: Medicare Other | Admitting: Family Medicine

## 2022-10-19 VITALS — BP 126/68 | HR 81 | Temp 97.8°F | Ht 67.0 in | Wt 223.5 lb

## 2022-10-19 DIAGNOSIS — R197 Diarrhea, unspecified: Secondary | ICD-10-CM | POA: Diagnosis not present

## 2022-10-19 DIAGNOSIS — R5383 Other fatigue: Secondary | ICD-10-CM | POA: Insufficient documentation

## 2022-10-19 DIAGNOSIS — R5381 Other malaise: Secondary | ICD-10-CM | POA: Diagnosis not present

## 2022-10-19 DIAGNOSIS — J029 Acute pharyngitis, unspecified: Secondary | ICD-10-CM

## 2022-10-19 DIAGNOSIS — R6889 Other general symptoms and signs: Secondary | ICD-10-CM | POA: Insufficient documentation

## 2022-10-19 DIAGNOSIS — I10 Essential (primary) hypertension: Secondary | ICD-10-CM

## 2022-10-19 DIAGNOSIS — D649 Anemia, unspecified: Secondary | ICD-10-CM

## 2022-10-19 DIAGNOSIS — R8290 Unspecified abnormal findings in urine: Secondary | ICD-10-CM

## 2022-10-19 LAB — POC URINALSYSI DIPSTICK (AUTOMATED)
Bilirubin, UA: 1
Blood, UA: NEGATIVE
Glucose, UA: NEGATIVE
Ketones, UA: 5
Nitrite, UA: NEGATIVE
Protein, UA: NEGATIVE
Spec Grav, UA: >=1.03 — AB (ref 1.010–1.025)
Urobilinogen, UA: 0.2 E.U./dL
pH, UA: 5.5 (ref 5.0–8.0)

## 2022-10-19 LAB — POCT RAPID STREP A (OFFICE): Rapid Strep A Screen: NEGATIVE

## 2022-10-19 LAB — POC COVID19 BINAXNOW: SARS Coronavirus 2 Ag: NEGATIVE

## 2022-10-19 NOTE — Progress Notes (Unsigned)
Subjective:    Patient ID: Lori Orozco, female    DOB: 1950/07/26, 72 y.o.   MRN: [id number]  HPI  Wt Readings from Last 3 Encounters:  10/19/22 223 lb 8 oz (101.4 kg)  09/10/22 218 lb (98.9 kg)  09/04/22 218 lb (98.9 kg)   35.01 kg/m  Vitals:   10/19/22 1432 10/19/22 1510  BP: 126/68   Pulse: 81   Temp: 97.8 F (36.6 C)   SpO2: 90% 92%    Original visit for surgical clearance Now changing to sick visit  Cold natured for a week- stayed in under blanket  Feels bad since this am  Pulse ox lower than usual this am   Achy - all over  No cough  Not wheezing    Takes prednisone 10 mg daily   Throat is sore / scratchy  Right ear is crackling (just had them cleared out)   Very runny nose  Very runny eyes   No n/v   Has some diarrhea ever since she started fosamax  Loose stool/mushy  3-4 times per day  No blood in her stool  No abd pain or cramping   No urinary symptoms  No more frequency or urgency than usual  No blood in urine   Neg covid and strep tests today  Lab Results  Component Value Date   TSH 2.64 10/19/2022   Lab Results  Component Value Date   COLORU Dark Yellow 10/19/2022   CLARITYU Clear 10/19/2022   GLUCOSEUR Negative 10/19/2022   BILIRUBINUR 1 mg/dL 40/98/1191   KETONESU 5 mg/dL 47/82/9562   SPECGRAV >=1.308 (A) 10/19/2022   RBCUR Negative 10/19/2022   PHUR 5.5 10/19/2022   PROTEINUR Negative 10/19/2022   UROBILINOGEN 0.2 10/19/2022   LEUKOCYTESUR Moderate (2+) (A) 10/19/2022      CBC    Component Value Date/Time   WBC 6.2 10/19/2022 1528   RBC 3.82 10/19/2022 1528   HGB 11.9 10/19/2022 1528   HGB 11.7 03/20/2022 1040   HCT 37.2 10/19/2022 1528   HCT 37.3 03/20/2022 1040   PLT 195 10/19/2022 1528   PLT 263 03/20/2022 1040   MCV 97.4 10/19/2022 1528   MCV 101 (H) 03/20/2022 1040   MCH 31.2 10/19/2022 1528   MCHC 32.0 10/19/2022 1528   RDW 15.1 (H) 10/19/2022 1528   RDW 13.5 03/20/2022 1040   LYMPHSABS 2,914  10/19/2022 1528   LYMPHSABS 2.5 03/20/2022 1040   MONOABS 0.4 07/13/2022 1223   EOSABS 62 10/19/2022 1528   EOSABS 0.0 03/20/2022 1040   BASOSABS 19 10/19/2022 1528   BASOSABS 0.0 03/20/2022 1040      Patient Active Problem List   Diagnosis Date Noted   Malaise and fatigue 10/19/2022   Diarrhea 10/19/2022   Cold intolerance 10/19/2022   Osteoporosis 09/10/2022   Laceration of forehead 08/29/2022   Thoracic compression fracture (HCC) 07/13/2022   Diabetes mellitus screening 07/13/2022   Lumbar herniated disc    Chronic pain 11/28/2021   Bilateral leg weakness 10/09/2021   Vitamin B12 deficiency 08/02/2021   Myofascial pain dysfunction syndrome 08/02/2021   Adverse effect of prednisone 07/11/2021   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   History of pulmonary embolism 12/08/2020   Arthritis of knee 12/06/2020   Bilateral knee pain 09/20/2020   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   High risk medication use 09/20/2020   Extensor tenosynovitis of right wrist 09/15/2020   S/P reverse total shoulder arthroplasty, 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   Lung nodule 01/02/2020   Status  post arthroscopy of left shoulder 01/16/2018   Estrogen deficiency 06/28/2017   Medial epicondylitis, left elbow 04/29/2017   S/P arthroscopy of left shoulder 02/25/2017   Impingement syndrome of left shoulder 01/30/2017   Pedal edema 10/26/2016   Trochanteric bursitis, right hip 04/10/2016   Hypertension 09/02/2015   Urge incontinence 07/27/2015   Obesity (BMI 30-39.9) 04/28/2015   Osteoarthritis of left knee 01/28/2015   Status post total left knee replacement 01/28/2015   Vitamin D deficiency 04/27/2014   Seasonal and perennial allergic rhinitis 06/17/2013   S/P total hip arthroplasty 03/20/2013   Medicare annual wellness visit, subsequent 11/18/2012   History of falling 11/18/2012   Routine general medical examination at a health care facility 11/10/2012   Lichen sclerosus et atrophicus of  the vulva 07/29/2012   Osteopenia 10/19/2011   Hypothyroidism 10/19/2011   ANEMIA 10/11/2009   Hereditary and idiopathic peripheral neuropathy 08/05/2009   Low back pain 08/05/2009   HAND PAIN, BILATERAL 08/05/2009   TREMOR 03/09/2008   MENOPAUSAL SYNDROME 10/09/2007   DEPRESSION, MAJOR 05/20/2007   BIPOLAR AFFECTIVE DISORDER 05/20/2007   Generalized anxiety disorder 05/20/2007   PERSONALITY DISORDER 05/20/2007   ESOPHAGEAL SPASM 05/20/2007   Diaphragmatic hernia 05/20/2007   AMAUROSIS FUGAX 05/07/2007   COPD mixed type (HCC) 03/27/2007   HYPERCHOLESTEROLEMIA, PURE 12/10/2006   SYMPTOM, SYNDROME, CHRONIC FATIGUE 12/10/2006   Past Medical History:  Diagnosis Date   Allergy Hayfever   1958   Amaurosis fugax    Anemia    hx   Anxiety    Arthritis    Asthma    Cataract    Chronic fatigue syndrome    Chronic kidney disease    frequency, nephritis when 72 yrs old   COPD (chronic obstructive pulmonary disease) (HCC)    COVID-19    Depression    Diverticulitis    Emphysema of lung (HCC)    Fibromyalgia    GERD (gastroesophageal reflux disease)    occ   H/O hiatal hernia    History of bronchitis    Hyperlipidemia    Hypothyroidism    Interstitial cystitis    Irritable bowel syndrome    Lichen sclerosus    Lumbar herniated disc    Migraines    Motor nerve conduction block 11/2021   Osteoporosis 09/10/2022   Pneumonia    PONV (postoperative nausea and vomiting)    Shortness of breath    exertion    Thyroid disease    [place]   Urinary frequency    Urinary tract infection    Vertigo    Past Surgical History:  Procedure Laterality Date   ABDOMINAL HYSTERECTOMY     bladder sugery      CAROTID DOPPLAR     COLONOSCOPY  05/26/2010   avms- otherwise nl , re check 10y   DEXA-OSTEOPENIA     DOPPLER ECHOCARDIOGRAPHY     elbow surgery     EPICONDYLITIS     EYE SURGERY Bilateral    cataracts   FOOT SURGERY Bilateral    I & D EXTREMITY Right 09/01/2020   Procedure:  TYNOSYNOVECTOMY;  Surgeon: [name], MD;  Location: [facility] OR;  Service: Orthopedics;  Laterality: Right;   IMPLANTATION VAGAL NERVE STIMULATOR  03/2022   JOINT REPLACEMENT     KNEE SURGERY     Left cartilage   lipoma in second finger right hand     MOUTH SURGERY  08/22/2022   PLANTAR FASCIA SURGERY Left    REVERSE SHOULDER ARTHROPLASTY Left  02/02/2020   Procedure: LEFT REVERSE SHOULDER ARTHROPLASTY;  Surgeon: [name], MD;  Location: [facility] OR;  Service: Orthopedics;  Laterality: Left;   ROTATOR CUFF REPAIR Left 12/2017   TEMPOROMANDIBULAR JOINT SURGERY     TONSILLECTOMY     TOTAL HIP ARTHROPLASTY Right 03/20/2013   Procedure: Right TOTAL HIP ARTHROPLASTY;  Surgeon: [name], MD;  Location: [facility] OR;  Service: Orthopedics;  Laterality: Right;  Right Total Hip Arthroplasty   TOTAL KNEE ARTHROPLASTY Left 01/28/2015   Procedure: LEFT TOTAL KNEE ARTHROPLASTY;  Surgeon: [name], MD;  Location: [facility];  Service: Orthopedics;  Laterality: Left;   TRIGGER FINGER RELEASE Right    TROCHANTERIC BURSA EXCISION Right 05/03/2016   Dr. [name], [name]   WRIST ARTHROSCOPY Right    ligament tear   Social History   Tobacco Use   Smoking status: Former    Current packs/day: 0.00    Average packs/day: 2.0 packs/day for 43.0 years (86.0 ttl pk-yrs)    Types: Cigarettes    Start date: 02/26/1970    Quit date: 02/26/2013    Years since quitting: 9.6    Passive exposure: Never   Smokeless tobacco: Never   Tobacco comments:    Vaping occasionally  Vaping Use   Vaping status: Some Days   Devices: uses about once per month  Substance Use Topics   Alcohol use: No   Drug use: No   Family History  Problem Relation Age of Onset   Arthritis Mother    Hypertension Mother    Kidney disease Mother    Heart disease Mother    Stroke Mother    Cancer Father        Bladder   Arthritis Sister    Heart disease Sister    Colon cancer Other    Cancer - Lung Cousin     Arthritis Maternal Grandmother    Hearing loss Maternal Grandfather    Varicose Veins Maternal Uncle    Allergies  Allergen Reactions   Lithium Anaphylaxis   Tegretol [Carbamazepine] Other (See Comments)    Fever and body aches (fever over 103)   Tricyclic Antidepressants Anaphylaxis    Other reaction(s): Unknown   Methotrexate Derivatives Other (See Comments)    Urinary retention and dizziness  Other reaction(s): Other UTI   Strawberry Extract Hives   Tapentadol Rash    PT ALLERGIC NYLON TAPE    Codeine Nausea Only    Makes pt stay awake   Cymbalta [Duloxetine Hcl] Other (See Comments)    Makes pt pass out    Erythromycin     abdominal pain   Lyrica [Pregabalin]     Felt faint   Neurontin [Gabapentin]     Passes  out   Rabeprazole Sodium     insomnia   Baclofen Nausea Only    Sever nausea   Duraprep [Antiseptic Products, Misc.] Itching and Rash    Tolerates Betadine    Penicillins Rash    Tolerated ANCEF 02/02/20  Has patient had a PCN reaction causing immediate rash, facial/tongue/throat swelling, SOB or lightheadedness with hypotension: Yes Has patient had a PCN reaction causing severe rash involving mucus membranes or skin necrosis: No Has patient had a PCN reaction that required hospitalization No Has patient had a PCN reaction occurring within the last 10 years: No If all of the above answers are "NO", then may proceed with Cephalosporin use.    Tape Rash    PT ALLERGIC NYLON TAPE  Current Outpatient Medications on File Prior to Visit  Medication Sig Dispense Refill   albuterol (VENTOLIN HFA) 108 (90 Base) MCG/ACT inhaler Inhale 1-2 puffs into the lungs every 4 (four) hours as needed for wheezing or shortness of breath. 1 each 0   alendronate (FOSAMAX) 70 MG tablet Take 1 tablet (70 mg total) by mouth once a week. Take with a full glass of water on an empty stomach. 12 tablet 1   Black Cohosh 40 MG CAPS Take 1 capsule (40 mg total) by mouth daily. 30  capsule 11   Budeson-Glycopyrrol-Formoterol (BREZTRI AEROSPHERE) 160-9-4.8 MCG/ACT AERO Inhale 2 puffs into the lungs 2 (two) times daily. 3 each 3   buPROPion (WELLBUTRIN XL) 150 MG 24 hr tablet Take 150 mg by mouth daily.     busPIRone (BUSPAR) 15 MG tablet Take 15 mg by mouth 2 (two) times daily before a meal.     cholecalciferol (VITAMIN D) 25 MCG (1000 UNIT) tablet Take 2,000 Units by mouth daily. 2 tablets daily     clobetasol cream (TEMOVATE) 0.05 % APPLY SMALL AMOUNT TO AFFECTED AREAS TWO TO THREE TIMES PER WEEK 30 g 4   cyanocobalamin 2000 MCG tablet Take 500 mcg by mouth daily.     diclofenac Sodium (VOLTAREN) 1 % GEL Apply 2 g topically 2 (two) times daily as needed (pain).     diphenhydrAMINE (BENADRYL) 25 MG tablet Take 25 mg by mouth every 6 (six) hours as needed for allergies.     divalproex (DEPAKOTE ER) 500 MG 24 hr tablet Take 500 mg by mouth at bedtime.      etanercept (ENBREL SURECLICK) 50 MG/ML injection Inject 50 mg into the skin once a week. 12 mL 0   fluticasone (FLONASE) 50 MCG/ACT nasal spray PLACE 2 SPRAYS INTO THE NOSE DAILY. 16 g 11   HYDROcodone-acetaminophen (NORCO/VICODIN) 5-325 MG tablet Take 1 tablet by mouth 3 (three) times daily as needed for moderate pain. 30 tablet 0   ipratropium-albuterol (DUONEB) 0.5-2.5 (3) MG/3ML SOLN Take 3 mLs by nebulization every 6 (six) hours as needed. 120 mL prn   ketotifen (ZADITOR) 0.025 % ophthalmic solution Place 1 drop into both eyes 2 (two) times daily as needed (allergies).     levothyroxine (SYNTHROID) 50 MCG tablet Take 1 tablet (50 mcg total) by mouth daily before breakfast. 90 tablet 1   LORazepam (ATIVAN) 1 MG tablet Take 1 mg by mouth at bedtime as needed for anxiety or sleep.     metoprolol succinate (TOPROL-XL) 25 MG 24 hr tablet TAKE 1/2 TABLET BY MOUTH EVERY MORNING AND EVERY NIGHT AT BEDTIME 90 tablet 1   mirabegron ER (MYRBETRIQ) 25 MG TB24 tablet Take 1 tablet (25 mg total) by mouth in the morning and at bedtime.  180 tablet 3   mirtazapine (REMERON) 30 MG tablet Take 30 mg by mouth at bedtime.      ondansetron (ZOFRAN-ODT) 4 MG disintegrating tablet Take 1 tablet (4 mg total) by mouth every 8 (eight) hours as needed for nausea or vomiting. 20 tablet 1   predniSONE (DELTASONE) 10 MG tablet Take 1 tablet (10 mg total) by mouth daily with breakfast. 90 tablet 0   Rhubarb (ESTROVEN COMPLETE) 4 MG TABS Take 4 mg by mouth daily. 30 tablet 11   sertraline (ZOLOFT) 100 MG tablet Take 100 mg by mouth daily.      simvastatin (ZOCOR) 20 MG tablet Take 1 tablet (20 mg total) by mouth at bedtime. 90 tablet 3  Spacer/Aero-Holding Chambers (AEROCHAMBER PLUS) inhaler Use with inhaler 1 each 2   No current facility-administered medications on file prior to visit.    Review of Systems  Constitutional:  Positive for fatigue. Negative for activity change, appetite change, diaphoresis, fever and unexpected weight change.       Achey Chilled Has not checked temp  No fever today  HENT:  Positive for sore throat. Negative for congestion, ear pain, rhinorrhea and sinus pressure.   Eyes:  Negative for pain, redness and visual disturbance.  Respiratory:  Positive for shortness of breath. Negative for cough and wheezing.        No more shortness of breath than usual Not coughing more than usual  Cardiovascular:  Negative for chest pain and palpitations.  Gastrointestinal:  Negative for abdominal pain, blood in stool, constipation and diarrhea.  Endocrine: Negative for polydipsia and polyuria.       Urination is less frequent on mybetriq  Genitourinary:  Negative for dysuria, frequency and urgency.  Musculoskeletal:  Positive for arthralgias and myalgias. Negative for back pain.  Skin:  Negative for pallor and rash.  Allergic/Immunologic: Negative for environmental allergies.  Neurological:  Negative for dizziness, syncope and headaches.  Hematological:  Negative for adenopathy. Does not bruise/bleed easily.   Psychiatric/Behavioral:  Positive for dysphoric mood. Negative for decreased concentration. The patient is not nervous/anxious.        Objective:   Physical Exam Constitutional:      General: She is not in acute distress.    Appearance: Normal appearance. She is well-developed. She is obese. She is not ill-appearing or diaphoretic.     Comments: Frail appearing   HENT:     Head: Normocephalic and atraumatic.     Right Ear: Tympanic membrane and ear canal normal.     Left Ear: Tympanic membrane and ear canal normal.     Ears:     Comments: Scant cerumen    Nose:     Comments: Boggy nares  Eyes:     General:        Right eye: No discharge.        Left eye: No discharge.     Conjunctiva/sclera: Conjunctivae normal.     Pupils: Pupils are equal, round, and reactive to light.  Neck:     Thyroid: No thyromegaly.     Vascular: No carotid bruit or JVD.  Cardiovascular:     Rate and Rhythm: Normal rate and regular rhythm.     Heart sounds: Normal heart sounds.     No gallop.  Pulmonary:     Effort: Pulmonary effort is normal. No respiratory distress.     Breath sounds: Normal breath sounds. No stridor. No wheezing, rhonchi or rales.     Comments: Diffusely distant bs  No active wheezing today Abdominal:     General: There is no distension or abdominal bruit.     Palpations: Abdomen is soft. There is no mass.     Tenderness: There is no abdominal tenderness. There is no right CVA tenderness, left CVA tenderness or guarding.  Musculoskeletal:     Cervical back: Normal range of motion and neck supple.     Right lower leg: No edema.     Left lower leg: No edema.     Comments: Diffuse tenderness of joints and myofacial trigger points/ muscles   Lymphadenopathy:     Cervical: No cervical adenopathy.  Skin:    General: Skin is warm and dry.  Coloration: Skin is not pale.     Findings: No rash.  Neurological:     Mental Status: She is alert.     Cranial Nerves: No cranial  nerve deficit.     Coordination: Coordination normal.     Deep Tendon Reflexes: Reflexes are normal and symmetric. Reflexes normal.  Psychiatric:        Mood and Affect: Mood normal.           Assessment & Plan:   Problem List Items Addressed This Visit       Cardiovascular and Mediastinum   Hypertension    bp is stable today  No cp or palpitations or headaches or edema  No side effects to medicines  BP Readings from Last 3 Encounters:  10/19/22 126/68  09/25/22 (!) 153/90  09/10/22 131/75    Continues metoprolol xl 12.5 mg daily Pulse rate of 81      Relevant Orders   CBC with Differential/Platelet (Completed)   Comprehensive metabolic panel (Completed)   TSH (Completed)     Other   ANEMIA    Feelign tired/poorly today  Lab ordered       Relevant Orders   CBC with Differential/Platelet (Completed)   Iron (Completed)   Cold intolerance    TSH today  Feels poorly in general       Relevant Orders   POCT Urinalysis Dipstick (Automated) (Completed)   TSH (Completed)   Diarrhea    Pt is unsure if this is a side effects of alendronate or related to her malaise Instructed to hold medication for a week and report back  Lab today   Consider stool studies if not improving       Relevant Orders   CBC with Differential/Platelet (Completed)   Comprehensive metabolic panel (Completed)   TSH (Completed)   Malaise and fatigue - Primary    Reassuring exam Baseline lung exam and baseline pulse ox Neg strep and covid testing  Does take prednisone and enbrel Some leukocytes in urinalysis-pending culture (also concentrated) Encouraged fluids   Lab today         Relevant Orders   POCT Urinalysis Dipstick (Automated) (Completed)   CBC with Differential/Platelet (Completed)   Comprehensive metabolic panel (Completed)   TSH (Completed)   Urine Culture   Other Visit Diagnoses     Sore throat       Relevant Orders   Rapid Strep A (Completed)   POC  COVID-19 (Completed)   CBC with Differential/Platelet (Completed)   Abnormal urinalysis       Relevant Orders   Urine Culture

## 2022-10-19 NOTE — Telephone Encounter (Signed)
Patient called again to see if she got a response from Dr Dimple Casey. Patient also inquires if the Fosamax can cause her to have loose stool. Please advise.

## 2022-10-19 NOTE — Patient Instructions (Signed)
Labs today for your symptoms / also urinalysis  Start checking your temperature to make sure you don't have a fever Watch your oxygen level also  Alert [place] if you start coughing or having more shortness of breath   I'm not sure if the fosamax is causing your diarrhea  Hold it a week and see if it improves    You can re schedule your surgical clearance appointment when you feel better   Stay hydrated

## 2022-10-20 LAB — CBC WITH DIFFERENTIAL/PLATELET
Absolute Monocytes: 440 {cells}/uL (ref 200–950)
Basophils Absolute: 19 {cells}/uL (ref 0–200)
Basophils Relative: 0.3 %
Eosinophils Absolute: 62 {cells}/uL (ref 15–500)
Eosinophils Relative: 1 %
HCT: 37.2 % (ref 35.0–45.0)
Hemoglobin: 11.9 g/dL (ref 11.7–15.5)
Lymphs Abs: 2914 {cells}/uL (ref 850–3900)
MCH: 31.2 pg (ref 27.0–33.0)
MCHC: 32 g/dL (ref 32.0–36.0)
MCV: 97.4 fL (ref 80.0–100.0)
MPV: 10.8 fL (ref 7.5–12.5)
Monocytes Relative: 7.1 %
Neutro Abs: 2765 {cells}/uL (ref 1500–7800)
Neutrophils Relative %: 44.6 %
Platelets: 195 10*3/uL (ref 140–400)
RBC: 3.82 10*6/uL (ref 3.80–5.10)
RDW: 15.1 % — ABNORMAL HIGH (ref 11.0–15.0)
Total Lymphocyte: 47 %
WBC: 6.2 10*3/uL (ref 3.8–10.8)

## 2022-10-20 LAB — COMPREHENSIVE METABOLIC PANEL WITH GFR
AG Ratio: 1.6 (calc) (ref 1.0–2.5)
ALT: 10 U/L (ref 6–29)
AST: 14 U/L (ref 10–35)
Albumin: 3.8 g/dL (ref 3.6–5.1)
Alkaline phosphatase (APISO): 58 U/L (ref 37–153)
BUN/Creatinine Ratio: 23 (calc) — ABNORMAL HIGH (ref 6–22)
BUN: 26 mg/dL — ABNORMAL HIGH (ref 7–25)
CO2: 30 mmol/L (ref 20–32)
Calcium: 9.5 mg/dL (ref 8.6–10.4)
Chloride: 103 mmol/L (ref 98–110)
Creat: 1.13 mg/dL — ABNORMAL HIGH (ref 0.60–1.00)
Globulin: 2.4 g/dL (ref 1.9–3.7)
Glucose, Bld: 82 mg/dL (ref 65–99)
Potassium: 4.6 mmol/L (ref 3.5–5.3)
Sodium: 145 mmol/L (ref 135–146)
Total Bilirubin: 0.6 mg/dL (ref 0.2–1.2)
Total Protein: 6.2 g/dL (ref 6.1–8.1)

## 2022-10-20 LAB — URINE CULTURE
MICRO NUMBER:: 15374016
SPECIMEN QUALITY:: ADEQUATE

## 2022-10-20 LAB — TSH: TSH: 2.64 m[IU]/L (ref 0.40–4.50)

## 2022-10-20 LAB — IRON: Iron: 106 ug/dL (ref 45–160)

## 2022-10-20 NOTE — Assessment & Plan Note (Signed)
bp is stable today  No cp or palpitations or headaches or edema  No side effects to medicines  BP Readings from Last 3 Encounters:  10/19/22 126/68  09/25/22 (!) 153/90  09/10/22 131/75    Continues metoprolol xl 12.5 mg daily Pulse rate of 81

## 2022-10-20 NOTE — Assessment & Plan Note (Addendum)
Reassuring exam Baseline lung exam and baseline pulse ox Neg strep and covid testing  Does take prednisone and enbrel Some leukocytes in urinalysis-pending culture (also concentrated) Encouraged fluids   Lab today

## 2022-10-20 NOTE — Assessment & Plan Note (Signed)
Pt is unsure if this is a side effects of alendronate or related to her malaise Instructed to hold medication for a week and report back  Lab today   Consider stool studies if not improving

## 2022-10-20 NOTE — Assessment & Plan Note (Signed)
Feelign tired/poorly today  Lab ordered

## 2022-10-20 NOTE — Assessment & Plan Note (Signed)
TSH today  Feels poorly in general

## 2022-10-21 ENCOUNTER — Encounter: Payer: Self-pay | Admitting: Family Medicine

## 2022-10-30 ENCOUNTER — Encounter: Payer: Self-pay | Admitting: Family Medicine

## 2022-10-30 ENCOUNTER — Ambulatory Visit (INDEPENDENT_AMBULATORY_CARE_PROVIDER_SITE_OTHER): Payer: Medicare Other | Admitting: Family Medicine

## 2022-10-30 VITALS — BP 130/70 | HR 66 | Temp 97.8°F | Ht 67.0 in | Wt 220.4 lb

## 2022-10-30 DIAGNOSIS — Z01818 Encounter for other preprocedural examination: Secondary | ICD-10-CM | POA: Diagnosis not present

## 2022-10-30 DIAGNOSIS — Z01810 Encounter for preprocedural cardiovascular examination: Secondary | ICD-10-CM | POA: Diagnosis not present

## 2022-10-30 DIAGNOSIS — I10 Essential (primary) hypertension: Secondary | ICD-10-CM | POA: Diagnosis not present

## 2022-10-30 DIAGNOSIS — J449 Chronic obstructive pulmonary disease, unspecified: Secondary | ICD-10-CM

## 2022-10-30 DIAGNOSIS — M8000XS Age-related osteoporosis with current pathological fracture, unspecified site, sequela: Secondary | ICD-10-CM

## 2022-10-30 DIAGNOSIS — M0600 Rheumatoid arthritis without rheumatoid factor, unspecified site: Secondary | ICD-10-CM

## 2022-10-30 NOTE — Assessment & Plan Note (Signed)
Per pt doing well  Bretzri and rescue inhalers   Will need pulmonary clearance for upcoming spine surgery with general anesth

## 2022-10-30 NOTE — Assessment & Plan Note (Signed)
Taking enbrel and prednisone 10 mg daily  Will need guidance from rheumatology re: upcoming spine surgery

## 2022-10-30 NOTE — Assessment & Plan Note (Signed)
Ok to hold alendronate for upcoming surgery if req of spine surgeon

## 2022-10-30 NOTE — Patient Instructions (Addendum)
I will work on paperwork   Don't forget your covid and flu shots   Make sure to wear Ted hose for surgery and afterwards   Will need to send clearance form to Dr Maple Hudson also

## 2022-10-30 NOTE — Assessment & Plan Note (Signed)
bp is stable today  No cp or palpitations or headaches or edema  No side effects to medicines  BP Readings from Last 3 Encounters:  10/30/22 130/70  10/19/22 126/68  09/25/22 (!) 153/90    Continues metoprolol xl 12.5 mg daily

## 2022-10-30 NOTE — Progress Notes (Signed)
Subjective:    Patient ID: Lori Orozco, female    DOB: 07-Feb-1951, 72 y.o.   MRN: [id number]  HPI  Wt Readings from Last 3 Encounters:  10/30/22 220 lb 6 oz (100 kg)  10/19/22 223 lb 8 oz (101.4 kg)  09/10/22 218 lb (98.9 kg)   34.52 kg/m  Vitals:   10/30/22 1436  BP: 130/70  Pulse: 66  Temp: 97.8 F (36.6 C)  SpO2: 92%   Feels better overall than last time   Pt presents for surgical clearance for upcoming spine surgery  Planning right L5-S1 foraminotomy (some time in oct)- Dr Christell Constant  Planning general anesthesia   Reviewed med allergies   Past anesthesia  Has had many surgeries in the past including orthopedic  Sometimes has nausea    EKG notes low voltage in precordial leads     HTN BP Readings from Last 3 Encounters:  10/30/22 130/70  10/19/22 126/68  09/25/22 (!) 153/90   Pulse Readings from Last 3 Encounters:  10/30/22 66  10/19/22 81  09/25/22 80    Metoprolol xl 12.5 mg daily   Echocardiogram 09 was normal  No past cardiology issues   History of copd  Sees pulmonary Dr Maple Hudson  Breathing is baseline   Breztri inhaler  Duo neb / albuterol for rescue   Cxr clear/stable 06/2022   Can walk without shortness of breath - can walk a mile with her stand up walker   Limited by pain/ not shortness of breath    Seronegative RA Sees rheumatology  Enbrel  Prednisone 10 mg daily   Has not touched base yet with rheum re: surgery    Fosamax for Osteoporosis   Remote history of DVT -provoked  After lying in bed for a whil e With chronic  GI bleeding cannot tolerate anticoag   Avoids blood pressure in left arm due to neuropathy in right     Lab Results  Component Value Date   INR 1.0 08/29/2020   INR 0.92 01/19/2015   INR 0.87 03/16/2013   Lab Results  Component Value Date   WBC 6.2 10/19/2022   HGB 11.9 10/19/2022   HCT 37.2 10/19/2022   MCV 97.4 10/19/2022   PLT 195 10/19/2022   Lab Results  Component Value Date    HGBA1C 5.2 07/13/2022   Lab Results  Component Value Date   NA 145 10/19/2022   K 4.6 10/19/2022   CO2 30 10/19/2022   GLUCOSE 82 10/19/2022   BUN 26 (H) 10/19/2022   CREATININE 1.13 (H) 10/19/2022   CALCIUM 9.5 10/19/2022   GFR 50.36 (L) 07/13/2022   EGFR 58 (L) 09/10/2022   GFRNONAA >60 09/06/2020   Lab Results  Component Value Date   ALT 10 10/19/2022   AST 14 10/19/2022   ALKPHOS 82 07/13/2022   BILITOT 0.6 10/19/2022    Hypothyroidism  Pt has no clinical changes No change in energy level/ hair or skin/ edema and no tremor Lab Results  Component Value Date   TSH 2.64 10/19/2022    Levothyroxine 50 mcg daily      Patient Active Problem List   Diagnosis Date Noted   Pre-operative clearance 10/30/2022   Diarrhea 10/19/2022   Cold intolerance 10/19/2022   Osteoporosis 09/10/2022   Laceration of forehead 08/29/2022   Thoracic compression fracture (HCC) 07/13/2022   Diabetes mellitus screening 07/13/2022   Lumbar herniated disc    Chronic pain 11/28/2021   Bilateral leg weakness 10/09/2021   Vitamin  B12 deficiency 08/02/2021   Myofascial pain dysfunction syndrome 08/02/2021   Adverse effect of prednisone 07/11/2021   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   History of pulmonary embolism 12/08/2020   Arthritis of knee 12/06/2020   Bilateral knee pain 09/20/2020   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   High risk medication use 09/20/2020   Extensor tenosynovitis of right wrist 09/15/2020   S/P reverse total shoulder arthroplasty, 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   Lung nodule 01/02/2020   Status post arthroscopy of left shoulder 01/16/2018   Estrogen deficiency 06/28/2017   Medial epicondylitis, left elbow 04/29/2017   S/P arthroscopy of left shoulder 02/25/2017   Impingement syndrome of left shoulder 01/30/2017   Trochanteric bursitis, right hip 04/10/2016   Hypertension 09/02/2015   Urge incontinence 07/27/2015   Obesity (BMI 30-39.9) 04/28/2015    Osteoarthritis of left knee 01/28/2015   Status post total left knee replacement 01/28/2015   Vitamin D deficiency 04/27/2014   Seasonal and perennial allergic rhinitis 06/17/2013   S/P total hip arthroplasty 03/20/2013   Medicare annual wellness visit, subsequent 11/18/2012   History of falling 11/18/2012   Routine general medical examination at a health care facility 11/10/2012   Lichen sclerosus et atrophicus of the vulva 07/29/2012   Osteopenia 10/19/2011   Hypothyroidism 10/19/2011   Hereditary and idiopathic peripheral neuropathy 08/05/2009   Low back pain 08/05/2009   HAND PAIN, BILATERAL 08/05/2009   TREMOR 03/09/2008   MENOPAUSAL SYNDROME 10/09/2007   DEPRESSION, MAJOR 05/20/2007   BIPOLAR AFFECTIVE DISORDER 05/20/2007   Generalized anxiety disorder 05/20/2007   PERSONALITY DISORDER 05/20/2007   ESOPHAGEAL SPASM 05/20/2007   Diaphragmatic hernia 05/20/2007   COPD mixed type (HCC) 03/27/2007   HYPERCHOLESTEROLEMIA, PURE 12/10/2006   SYMPTOM, SYNDROME, CHRONIC FATIGUE 12/10/2006   Past Medical History:  Diagnosis Date   Allergy Hayfever   1958   Amaurosis fugax    Anemia    hx   Anxiety    Arthritis    Asthma    Cataract    Chronic fatigue syndrome    Chronic kidney disease    frequency, nephritis when 72 yrs old   COPD (chronic obstructive pulmonary disease) (HCC)    COVID-19    Depression    Diverticulitis    Emphysema of lung (HCC)    Fibromyalgia    GERD (gastroesophageal reflux disease)    occ   H/O hiatal hernia    History of bronchitis    Hyperlipidemia    Hypothyroidism    Interstitial cystitis    Irritable bowel syndrome    Lichen sclerosus    Lumbar herniated disc    Migraines    Motor nerve conduction block 11/2021   Osteoporosis 09/10/2022   Pneumonia    PONV (postoperative nausea and vomiting)    Shortness of breath    exertion    Thyroid disease    Graves   Urinary frequency    Urinary tract infection    Vertigo    Past Surgical  History:  Procedure Laterality Date   ABDOMINAL HYSTERECTOMY     bladder sugery      CAROTID DOPPLAR     COLONOSCOPY  05/26/2010   avms- otherwise nl , re check 10y   DEXA-OSTEOPENIA     DOPPLER ECHOCARDIOGRAPHY     elbow surgery     EPICONDYLITIS     EYE SURGERY Bilateral    cataracts   FOOT SURGERY Bilateral    I & D EXTREMITY Right 09/01/2020   Procedure: TYNOSYNOVECTOMY;  Surgeon:  [name], MD;  Location[facility] OR;  Service: Orthopedics;  Laterality: Right;   IMPLANTATION VAGAL NERVE STIMULATOR  03/2022   JOINT REPLACEMENT     KNEE SURGERY     Left cartilage   lipoma in second finger right hand     MOUTH SURGERY  08/22/2022   PLANTAR FASCIA SURGERY Left    REVERSE SHOULDER ARTHROPLASTY Left 02/02/2020   Procedure: LEFT REVERSE SHOULDER ARTHROPLASTY;  Surgeon: [name], MD;  Location: [facility] OR;  Service: Orthopedics;  Laterality: Left;   ROTATOR CUFF REPAIR Left 12/2017   TEMPOROMANDIBULAR JOINT SURGERY     TONSILLECTOMY     TOTAL HIP ARTHROPLASTY Right 03/20/2013   Procedure: Right TOTAL HIP ARTHROPLASTY;  Surgeon: [name], MD;  Location: [facility] OR;  Service: Orthopedics;  Laterality: Right;  Right Total Hip Arthroplasty   TOTAL KNEE ARTHROPLASTY Left 01/28/2015   Procedure: LEFT TOTAL KNEE ARTHROPLASTY;  Surgeon: [name], MD;  Location: [facility] ORS;  Service: Orthopedics;  Laterality: Left;   TRIGGER FINGER RELEASE Right    TROCHANTERIC BURSA EXCISION Right 05/03/2016   Dr. [name], [name]   WRIST ARTHROSCOPY Right    ligament tear   Social History   Tobacco Use   Smoking status: Former    Current packs/day: 0.00    Average packs/day: 2.0 packs/day for 43.0 years (86.0 ttl pk-yrs)    Types: Cigarettes    Start date: 02/26/1970    Quit date: 02/26/2013    Years since quitting: 9.6    Passive exposure: Never   Smokeless tobacco: Never   Tobacco comments:    Vaping occasionally  Vaping Use   Vaping status: Some Days   Devices:  uses about once per month  Substance Use Topics   Alcohol use: No   Drug use: No   Family History  Problem Relation Age of Onset   Arthritis Mother    Hypertension Mother    Kidney disease Mother    Heart disease Mother    Stroke Mother    Cancer Father        Bladder   Arthritis Sister    Heart disease Sister    Colon cancer Other    Cancer - Lung Cousin    Arthritis Maternal Grandmother    Hearing loss Maternal Grandfather    Varicose Veins Maternal Uncle    Allergies  Allergen Reactions   Lithium Anaphylaxis   Tegretol [Carbamazepine] Other (See Comments)    Fever and body aches (fever over 103)   Tricyclic Antidepressants Anaphylaxis    Other reaction(s): Unknown   Methotrexate Derivatives Other (See Comments)    Urinary retention and dizziness  Other reaction(s): Other UTI   Strawberry Extract Hives   Tapentadol Rash    PT ALLERGIC NYLON TAPE    Codeine Nausea Only    Makes pt stay awake   Cymbalta [Duloxetine Hcl] Other (See Comments)    Makes pt pass out    Erythromycin     abdominal pain   Lyrica [Pregabalin]     Felt faint   Neurontin [Gabapentin]     Passes  out   Rabeprazole Sodium     insomnia   Baclofen Nausea Only    Sever nausea   Duraprep [Antiseptic Products, Misc.] Itching and Rash    Tolerates Betadine    Penicillins Rash    Tolerated ANCEF 02/02/20  Has patient had a PCN reaction causing immediate rash, facial/tongue/throat swelling, SOB or lightheadedness with hypotension: Yes Has  patient had a PCN reaction causing severe rash involving mucus membranes or skin necrosis: No Has patient had a PCN reaction that required hospitalization No Has patient had a PCN reaction occurring within the last 10 years: No If all of the above answers are "NO", then may proceed with Cephalosporin use.    Tape Rash    PT ALLERGIC NYLON TAPE    Current Outpatient Medications on File Prior to Visit  Medication Sig Dispense Refill   albuterol (VENTOLIN  HFA) 108 (90 Base) MCG/ACT inhaler Inhale 1-2 puffs into the lungs every 4 (four) hours as needed for wheezing or shortness of breath. 1 each 0   alendronate (FOSAMAX) 70 MG tablet Take 1 tablet (70 mg total) by mouth once a week. Take with a full glass of water on an empty stomach. 12 tablet 1   Black Cohosh 40 MG CAPS Take 1 capsule (40 mg total) by mouth daily. 30 capsule 11   Budeson-Glycopyrrol-Formoterol (BREZTRI AEROSPHERE) 160-9-4.8 MCG/ACT AERO Inhale 2 puffs into the lungs 2 (two) times daily. 3 each 3   buPROPion (WELLBUTRIN XL) 150 MG 24 hr tablet Take 150 mg by mouth daily.     busPIRone (BUSPAR) 15 MG tablet Take 15 mg by mouth 2 (two) times daily before a meal.     cholecalciferol (VITAMIN D) 25 MCG (1000 UNIT) tablet Take 2,000 Units by mouth daily. 2 tablets daily     clobetasol cream (TEMOVATE) 0.05 % APPLY SMALL AMOUNT TO AFFECTED AREAS TWO TO THREE TIMES PER WEEK 30 g 4   cyanocobalamin 2000 MCG tablet Take 500 mcg by mouth daily.     diclofenac Sodium (VOLTAREN) 1 % GEL Apply 2 g topically 2 (two) times daily as needed (pain).     diphenhydrAMINE (BENADRYL) 25 MG tablet Take 25 mg by mouth every 6 (six) hours as needed for allergies.     divalproex (DEPAKOTE ER) 500 MG 24 hr tablet Take 500 mg by mouth at bedtime.      etanercept (ENBREL SURECLICK) 50 MG/ML injection Inject 50 mg into the skin once a week. 12 mL 0   fluticasone (FLONASE) 50 MCG/ACT nasal spray PLACE 2 SPRAYS INTO THE NOSE DAILY. 16 g 11   HYDROcodone-acetaminophen (NORCO/VICODIN) 5-325 MG tablet Take 1 tablet by mouth 3 (three) times daily as needed for moderate pain. 30 tablet 0   ipratropium-albuterol (DUONEB) 0.5-2.5 (3) MG/3ML SOLN Take 3 mLs by nebulization every 6 (six) hours as needed. 120 mL prn   ketotifen (ZADITOR) 0.025 % ophthalmic solution Place 1 drop into both eyes 2 (two) times daily as needed (allergies).     levothyroxine (SYNTHROID) 50 MCG tablet Take 1 tablet (50 mcg total) by mouth daily  before breakfast. 90 tablet 1   LORazepam (ATIVAN) 1 MG tablet Take 1 mg by mouth at bedtime as needed for anxiety or sleep.     metoprolol succinate (TOPROL-XL) 25 MG 24 hr tablet TAKE 1/2 TABLET BY MOUTH EVERY MORNING AND EVERY NIGHT AT BEDTIME 90 tablet 1   mirabegron ER (MYRBETRIQ) 25 MG TB24 tablet Take 1 tablet (25 mg total) by mouth in the morning and at bedtime. 180 tablet 3   mirtazapine (REMERON) 30 MG tablet Take 30 mg by mouth at bedtime.      ondansetron (ZOFRAN-ODT) 4 MG disintegrating tablet Take 1 tablet (4 mg total) by mouth every 8 (eight) hours as needed for nausea or vomiting. 20 tablet 1   predniSONE (DELTASONE) 10 MG tablet Take 1  tablet (10 mg total) by mouth daily with breakfast. 90 tablet 0   Rhubarb (ESTROVEN COMPLETE) 4 MG TABS Take 4 mg by mouth daily. 30 tablet 11   sertraline (ZOLOFT) 100 MG tablet Take 100 mg by mouth daily.      simvastatin (ZOCOR) 20 MG tablet Take 1 tablet (20 mg total) by mouth at bedtime. 90 tablet 3   Spacer/Aero-Holding Chambers (AEROCHAMBER PLUS) inhaler Use with inhaler 1 each 2   No current facility-administered medications on file prior to visit.    Review of Systems  Constitutional:  Negative for activity change, appetite change, fatigue, fever and unexpected weight change.  HENT:  Negative for congestion, ear pain, rhinorrhea, sinus pressure and sore throat.   Eyes:  Negative for pain, redness and visual disturbance.  Respiratory:  Negative for cough, shortness of breath and wheezing.        Baseline 02 sat in low 90s  Not shortness of breath today   Cardiovascular:  Negative for chest pain and palpitations.  Gastrointestinal:  Negative for abdominal pain, blood in stool, constipation and diarrhea.  Endocrine: Negative for polydipsia and polyuria.  Genitourinary:  Negative for dysuria, frequency and urgency.  Musculoskeletal:  Positive for arthralgias and back pain. Negative for myalgias.  Skin:  Negative for pallor and rash.   Allergic/Immunologic: Negative for environmental allergies.  Neurological:  Negative for dizziness, syncope and headaches.  Hematological:  Negative for adenopathy. Does not bruise/bleed easily.  Psychiatric/Behavioral:  Negative for decreased concentration and dysphoric mood. The patient is not nervous/anxious.        Objective:   Physical Exam Constitutional:      General: She is not in acute distress.    Appearance: Normal appearance. She is well-developed. She is obese. She is not ill-appearing or diaphoretic.  HENT:     Head: Normocephalic and atraumatic.     Mouth/Throat:     Mouth: Mucous membranes are moist.  Eyes:     General: No scleral icterus.    Conjunctiva/sclera: Conjunctivae normal.     Pupils: Pupils are equal, round, and reactive to light.  Neck:     Thyroid: No thyromegaly.     Vascular: No carotid bruit or JVD.  Cardiovascular:     Rate and Rhythm: Normal rate and regular rhythm.     Pulses: Normal pulses.     Heart sounds: Normal heart sounds. No murmur heard.    No gallop.  Pulmonary:     Effort: Pulmonary effort is normal. No respiratory distress.     Breath sounds: Normal breath sounds. No stridor. No wheezing, rhonchi or rales.     Comments: Diffusely distant bs  No wheezing  No crackles  Abdominal:     General: There is no distension or abdominal bruit.     Palpations: Abdomen is soft.     Tenderness: There is no abdominal tenderness.  Musculoskeletal:     Cervical back: Normal range of motion and neck supple.     Right lower leg: No edema.     Left lower leg: No edema.     Comments: Limited rom of LS and LEs due to pain   Lymphadenopathy:     Cervical: No cervical adenopathy.  Skin:    General: Skin is warm and dry.     Coloration: Skin is not jaundiced or pale.     Findings: No bruising or rash.  Neurological:     Mental Status: She is alert.     Cranial Nerves:  No cranial nerve deficit.     Coordination: Coordination normal.      Deep Tendon Reflexes: Reflexes are normal and symmetric. Reflexes normal.     Comments: Uses walker due to pain   Psychiatric:        Mood and Affect: Mood normal.           Assessment & Plan:   Problem List Items Addressed This Visit       Cardiovascular and Mediastinum   Hypertension    bp is stable today  No cp or palpitations or headaches or edema  No side effects to medicines  BP Readings from Last 3 Encounters:  10/30/22 130/70  10/19/22 126/68  09/25/22 (!) 153/90    Continues metoprolol xl 12.5 mg daily         Respiratory   COPD mixed type (HCC)    Per pt doing well  Bretzri and rescue inhalers   Will need pulmonary clearance for upcoming spine surgery with general anesth         Musculoskeletal and Integument   Osteoporosis    Ok to hold alendronate for upcoming surgery if req of spine surgeon       Seronegative rheumatoid arthritis (HCC)    Taking enbrel and prednisone 10 mg daily  Will need guidance from rheumatology re: upcoming spine surgery          Other   Pre-operative clearance - Primary    Current chronic health issues are stable Will need clearance from pulmonary for gen anesth for spine surgery  Stable EKG Will need guidance from rheum re: enbrel and prednisone  Can hold fosamax as long as spine surgeon sees fit  No BP readings on right arm due to chronic neuropathy  Cannot have anticoagulation due to history of chronic GI bleed (can wear TED hose)- recommended due to past provoked DVT Reviewed med allergies Reassuring exam today Per pt if pain is controlled (and using upright walker) she can walk a mile and tolerate that well      Other Visit Diagnoses     Pre-operative cardiovascular examination       Relevant Orders   EKG 12-Lead (Completed)

## 2022-10-30 NOTE — Assessment & Plan Note (Addendum)
Current chronic health issues are stable Will need clearance from pulmonary for gen anesth for spine surgery  Stable EKG Will need guidance from rheum re: enbrel and prednisone  Can hold fosamax as long as spine surgeon sees fit  No BP readings on right arm due to chronic neuropathy  Cannot have anticoagulation due to history of chronic GI bleed (can wear TED hose)- recommended due to past provoked DVT Reviewed med allergies Reassuring exam today Per pt if pain is controlled (and using upright walker) she can walk a mile and tolerate that well

## 2022-11-01 ENCOUNTER — Telehealth: Payer: Self-pay | Admitting: Orthopaedic Surgery

## 2022-11-01 NOTE — Telephone Encounter (Signed)
Patient called needing Rx refilled Hydrocodone. The number to contact patient is [phone] 

## 2022-11-02 MED ORDER — TRAMADOL HCL 50 MG PO TABS: 50.0000 mg | ORAL_TABLET | Freq: Four times a day (QID) | ORAL | 0 refills | Status: AC | PRN

## 2022-11-05 ENCOUNTER — Other Ambulatory Visit: Payer: Self-pay | Admitting: *Deleted

## 2022-11-05 DIAGNOSIS — M0600 Rheumatoid arthritis without rheumatoid factor, unspecified site: Secondary | ICD-10-CM

## 2022-11-05 DIAGNOSIS — Z9225 Personal history of immunosupression therapy: Secondary | ICD-10-CM

## 2022-11-05 DIAGNOSIS — Z79899 Other long term (current) drug therapy: Secondary | ICD-10-CM

## 2022-11-05 DIAGNOSIS — Z111 Encounter for screening for respiratory tuberculosis: Secondary | ICD-10-CM

## 2022-11-05 MED ORDER — ENBREL SURECLICK 50 MG/ML ~~LOC~~ SOAJ
50.0000 mg | SUBCUTANEOUS | 0 refills | Status: DC
Start: 2022-11-05 — End: 2023-01-16

## 2022-11-05 NOTE — Telephone Encounter (Signed)
Patient contacted the office to request a medication refill.   1. Name of Medication: Enbrel  2. How are you currently taking this medication (dosage and times per day)? SQ once weekly   3. What pharmacy would you like for that to be sent to? [facility]

## 2022-11-05 NOTE — Telephone Encounter (Signed)
Last Fill: 08/20/2022  Labs: 10/19/2022 RDW 15.1, BUN 26, Creat. 1.13, BUN/Creat. Ratio 23  TB Gold: 10/09/2021   Next Visit: 12/17/2022  Last Visit: 09/10/2022  DX: Seronegative rheumatoid arthritis   Current Dose per office note 09/10/2022: Enbrel 50 mg Dillon weekly   Patient coming to update TB Gold this week or next.   Okay to refill Enbrel?

## 2022-11-06 ENCOUNTER — Telehealth: Payer: Self-pay | Admitting: Internal Medicine

## 2022-11-06 NOTE — Telephone Encounter (Signed)
Fax received from Dr. Willia Craze with Cyndia Skeeters at [facility] to perform a Right L5-S1 Foraminotomy on patient.  Patient needs surgery clearance. Surgery is Pending. Patient was seen on 12/19/21. Office protocol is a risk assessment can be sent to surgeon if patient has been seen in 60 days or less.   Sending to Dr. Maple Hudson for risk assessment or recommendations if patient needs to be seen in office prior to surgical procedure.    Patient has OV scheduled on 12/20/22

## 2022-11-07 ENCOUNTER — Telehealth: Payer: Self-pay | Admitting: Family Medicine

## 2022-11-07 ENCOUNTER — Other Ambulatory Visit: Payer: Self-pay | Admitting: *Deleted

## 2022-11-07 DIAGNOSIS — Z111 Encounter for screening for respiratory tuberculosis: Secondary | ICD-10-CM

## 2022-11-07 DIAGNOSIS — Z9225 Personal history of immunosupression therapy: Secondary | ICD-10-CM

## 2022-11-07 NOTE — Telephone Encounter (Signed)
Pt called stating she received 2 text messages from breztri inhalers informing pt that she needed to comp some ppw to determine if she was still eligible for her inhalers. Pt states the prev pharmacist, Lillia Abed, use to handle the ppw for her regarding the inhalers. Pt asked if the new pharmacist, Lillia Abed, would know how to help her with the ppw? Call back # [phone]

## 2022-11-08 ENCOUNTER — Encounter: Payer: Self-pay | Admitting: Pharmacist

## 2022-11-09 LAB — QUANTIFERON-TB GOLD PLUS
Mitogen-NIL: >10 [IU]/mL
NIL: 0.02 [IU]/mL
QuantiFERON-TB Gold Plus: NEGATIVE
TB1-NIL: 0 [IU]/mL
TB2-NIL: 0 [IU]/mL

## 2022-11-12 ENCOUNTER — Ambulatory Visit: Payer: Self-pay

## 2022-11-12 NOTE — Patient Outreach (Signed)
Care Coordination   Follow Up Visit Note   11/12/2022 Name: Lori Orozco MRN: [id number] DOB: [birth year]  Lori Orozco is a 72 y.o. year old female who sees Tower, Audrie Gallus, MD for primary care. I spoke with  [name] by phone today.  What matters to the patients health and wellness today?  Patient will relocate to [facility] to live with her sister.    Goals Addressed             This Visit's Progress    Housing and Food resources       Interventions Today    Flowsheet Row Most Recent Value  Chronic Disease   Chronic disease during today's visit Hypertension (HTN)  General Interventions   General Interventions Discussed/Reviewed General Interventions Discussed, General Interventions Reviewed  [Pt moves in w/sister 10/9.  Plans to stay with sister for a while.  [employer] Wheels on hold in [place] and will apply in [place]. SW provided [employer] Whe[employer] contact info to be added to the waiting list.]              SDOH assessments and interventions completed:  No     Care Coordination Interventions:  Yes, provided   Follow up plan: Follow up call scheduled for 12/06/22 at 2pm    Encounter Outcome:  Patient Visit Completed

## 2022-11-12 NOTE — Patient Instructions (Signed)
Visit Information  Thank you for taking time to visit with me today. Please don't hesitate to contact me if I can be of assistance to you.   Following are the goals we discussed today:  Patient to move with sister on 10/09 Patient to contact [employer] Whe[employer] [place]   Our next appointment is by telephone on 12/06/22 at 2pm  Please call the care guide team at [phone] if you need to cancel or reschedule your appointment.   If you are experiencing a Mental Health or Behavioral Health Crisis or need someone to talk to, please call 911  Patient verbalizes understanding of instructions and care plan provided today and agrees to view in MyChart. Active MyChart status and patient understanding of how to access instructions and care plan via MyChart confirmed with patient.     Telephone follow up appointment with care management team member scheduled for: 12/06/22 at 2pm.   Lysle Morales, BSW Social Worker [phone]

## 2022-11-20 ENCOUNTER — Other Ambulatory Visit: Payer: Self-pay | Admitting: Orthopaedic Surgery

## 2022-11-20 ENCOUNTER — Telehealth: Payer: Self-pay | Admitting: Orthopaedic Surgery

## 2022-11-20 MED ORDER — HYDROCODONE-ACETAMINOPHEN 5-325 MG PO TABS: 1.0000 | ORAL_TABLET | Freq: Three times a day (TID) | ORAL | 0 refills | Status: DC | PRN

## 2022-11-20 NOTE — Telephone Encounter (Signed)
Patient called asked if she can get an gel injection in her right knee?   Patient also need Rx refilled Hydrocodone. The number to contact patient is [phone]

## 2022-11-20 NOTE — Telephone Encounter (Signed)
Lvm advising pt.

## 2022-11-21 ENCOUNTER — Telehealth (HOSPITAL_BASED_OUTPATIENT_CLINIC_OR_DEPARTMENT_OTHER): Payer: Self-pay

## 2022-11-21 NOTE — Telephone Encounter (Signed)
VOB submitted for Monovisc, right knee  

## 2022-11-21 NOTE — Telephone Encounter (Signed)
Please get auth for right knee gel injection

## 2022-11-22 ENCOUNTER — Ambulatory Visit: Payer: Self-pay

## 2022-11-22 NOTE — Patient Outreach (Signed)
Care Coordination   11/22/2022 Name: Lori Orozco MRN: [id number] DOB: [birth year]   Care Coordination Outreach Attempts:  An unsuccessful telephone outreach was attempted for a scheduled appointment today.  Follow Up Plan:  Additional outreach attempts will be made to offer the patient care coordination information and services.   Encounter Outcome:  No Answer   Care Coordination Interventions:  No, not indicated    George Ina Avala [facility] Care Coordination [phone] direct line

## 2022-11-27 ENCOUNTER — Telehealth: Payer: Self-pay

## 2022-11-27 DIAGNOSIS — Z5982 Transportation insecurity: Secondary | ICD-10-CM

## 2022-11-28 ENCOUNTER — Telehealth: Payer: Self-pay

## 2022-11-28 NOTE — Telephone Encounter (Signed)
Telephone encounter was:  Unsuccessful.  11/28/2022 Name: [name] MRN: [id number] DOB: 01/04/1951  Unsuccessful outbound call made today to assist with:  Transportation Needs   Outreach Attempt:  1st Attempt  A HIPAA compliant voice message was left requesting a return call.  Instructed patient to call back.    Lenard Forth [place]  [facility], [facility] Guide, Phone: [phone] Website: [email or web address]

## 2022-11-29 ENCOUNTER — Telehealth: Payer: Self-pay

## 2022-11-29 NOTE — Telephone Encounter (Signed)
Telephone encounter was:  Successful.  11/29/2022 Name: Lori Orozco MRN: [id number] DOB: September 01, 1950  Lori Orozco is a 72 y.o. year old female who is a primary care patient of Tower, Audrie Gallus, MD . The community resource team was consulted for assistance with Transportation Needs   Care guide performed the following interventions: Patient provided with information about care guide support team and interviewed to confirm resource needs.Patient has requested resources for transportation for appointments. I will be emailing and mailing resources to the patient at [email or web address] new address will be [address] [place] [address]   Follow Up Plan:  No further follow up planned at this time. The patient has been provided with needed resources.   Lenard Forth [place]  [facility], [facility] Guide, Phone: [phone] Website: [email or web address]

## 2022-12-03 ENCOUNTER — Other Ambulatory Visit: Payer: Self-pay

## 2022-12-03 DIAGNOSIS — M8000XS Age-related osteoporosis with current pathological fracture, unspecified site, sequela: Secondary | ICD-10-CM

## 2022-12-03 MED ORDER — ALENDRONATE SODIUM 70 MG PO TABS
70.0000 mg | ORAL_TABLET | ORAL | 0 refills | Status: DC
Start: 2022-12-03 — End: 2023-02-11

## 2022-12-03 NOTE — Telephone Encounter (Signed)
Patient called requesting a refill of fosamax to her NEW pharmacy--[facility] [address] in [place].  Patient is moving and changing pharmacies. She attempted to transfer the rx since it has a refill remaining but was advised by the pharmacy she did not have a refill. I called [facility] in [place] and confirmed she does have a refill but [facility].   Last Fill: 09/14/2022  Next Visit: 12/17/2022  Last Visit: 09/10/2022  DX: Osteoporosis with current pathological fracture, unspecified osteoporosis type, sequela   Current Dose per office note on 09/10/2022: alendronate 70 mg weekly.   Okay to refill Fosamax?

## 2022-12-04 ENCOUNTER — Telehealth: Payer: Self-pay | Admitting: *Deleted

## 2022-12-04 NOTE — Progress Notes (Deleted)
Office Visit Note  Patient: Lori Orozco             Date of Birth: 1950-04-16           MRN: [id number]             PCP: [name], MD Referring: Tower, Audrie Gallus, MD Visit Date: 12/17/2022   Subjective:  No chief complaint on file.   History of Present Illness: Lori Orozco is a 72 y.o. female here for follow up for seronegative RA on Enbrel 50 mg [place] weekly and prednisone 10 mg daily.    Previous HPI 09/10/2022 Lori Orozco is a 72 y.o. female here for follow up for seronegative RA on Enbrel 50 mg [place] weekly and prednisone 10 mg daily. She has significant pain in her back and legs interval workup indicating nerve impingement and is scheduled for injections with Dr. Alvester Morin for this. MRI also with vertebral fractures probably chronic. In her arms right wrist remains painful and decreased mobility worst on the ulnar side.   Previous HPI 05/30/22 Lori Orozco is a 72 y.o. female here for follow up for seronegative RA on Enbrel 50 mg subcu weekly sulfasalazine 1000 mg twice daily and prednisone 20 mg daily.  She was taking the medications without interruptions but started experiencing increased joint pain swelling and stiffness especially affecting her hands wrists and elbows since about 2 weeks ago.  She called about this and we increased her to 20 mg prednisone daily up from 10 mg and she has noticed improvement in symptoms especially her elbows.  No longer swollen and is able to tolerate resting her arms on a flat surface without severe pain.  She did suffer from upper respiratory symptoms last week but these have mostly resolved without any new medication needed.    Previous HPI 02/28/22 Lori Orozco is a 72 y.o. female here for follow up for seronegative rheumatoid arthritis on Enbrel 50 mg subcu weekly and prednisone 10 mg daily.  She had to stop taking the sulfasalazine since about 2 weeks ago due to running out of the medication.  She had change with  insurance status so having a dramatically increased cost for her medications at the moment that she is having to sort out.  She was tolerating the medicine okay.  She did not recall a noticeable difference in symptoms with starting the sulfasalazine but while off the medication has felt noticeably worse so thinks it was doing something.  Particularly the left wrist pain has been a lot better on treatment her right wrist continues to be bad before and after.  She saw Dr. Wynetta Emery for her neck recommendation was for physical therapy but she has not pursued this since and does not feel like she would tolerate this well based on previous therapy experiences.  She does have plans for trying the nerve stimulator treatment starting later this month.      Previous HPI 12/21/21 Lori Orozco is a 72 y.o. 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  She continues to have pain and stiffness affecting her hands bilaterally although the severity of swelling has gotten notably better on the Enbrel treatment but is far from resolved.  Her biggest concern at the moment is about her neck pain concern of cervical radiculopathy.  We checked x-ray that showed considerable multilevel degenerative disease with no acute appearing bony abnormality.  She is followed up with  her spine doctor with epidural block injection tried did not find it very beneficial at all.  She has discussed radiofrequency ablation as a neck step option for radicular pain.     Previous HPI 10/09/2021 Lori Orozco is a 72 y.o. female here for follow up for seronegative inflammatory arthritis on Enbrel 50 mg [place] weekly and prednisone 10 mg daily. She tried tapering prednisone but had severe worsening symptoms at 5 mg dose. She increased back to 10 mg daily and symptoms improved somewhat but still a lot of pain and swelling in both hands and wrists. She has ongoing back pain and stiffness and is  seeing D.r Wynetta Emery for this with known significant lumbar spine arthritis. More recently new problem with sensitivity and stinging type pain throughout both arms with even light pressure such as showering water. She also has worsening weakness in her proximal legs unable to stand without using arms, which are limited by pain.     Previous HPI 07/05/2021 Lori Orozco is a 72 y.o. female here for follow up for seronegative inflammatory arthritis on prednisone 20 mg daily dose and enbrel 50mg  subcutaneous once weekly. She has seen a large improvement in joint inflammation since starting the Enbrel. She has pain and redness around the injection site lasting up to 3 days with each dose. Wrists continue to hurt and have decreased movement left wrist actually more symptomatic than right now. She is working with physical therapy on getting back to walking currently very weak and unstable due to illness and deconditioning. She had recent ED visit for COPD exacerbation finished antibiotics for this. No other recent infection issues.   Previous HPI 02/08/2021 Lori Orozco is a 72 y.o. 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  She has had major medical events since her last visit.  She developed increased knee pain and swelling on the right side.  However she went to the hospital due to developing more weakness and shortness of breath CT angiogram identified bilateral pulmonary emboli.  Treatment with anticoagulation led to significant gastrointestinal bleeding from previously unknown AVM.  This was treated also in the hospital but overall protracted complications lasted for about a month hospitalization.  During this time she experienced worsening joint pain and swelling and significant deconditioning and weakness.  She was discharged from the hospital to rehab facility she was not continued on any steroid or other anti-inflammatory treatment  reports joint pain and swelling affecting numerous joints in bilateral elbows wrists fingers and knees.  This is limited any significant progress with physical therapy so far.  She called back to clinic earlier this month with the symptoms and was restarted on a prednisone taper currently down to 15 mg daily.  She saw orthopedics clinic for right knee aspiration injection 2 days ago that is partially helping.   Previous HPI 11/15/20 Lori Orozco is a 72 y.o. female here for follow up with wrist wrist extensor tenosynovitis and presumed seronegative inflammatory arthritis also involving knee joints after starting methotrexate 15 mg PO weekly and tapering prednisone off. Her wrist is slightly improved but remains very painful and swollen with decreased mobility. After starting methotrexate she has noticed some episodes of dizziness and urinary retention and frequency. She stopped the prednisone without noticing much difference in symptoms so far.     09/20/20 Lori Orozco is a 72 y.o. female here for joint pain and swelling of knees and wrists  with severe tenosynovitis s/p tenosynovectomy on 09/01/20 with Dr. [name].  She had been feeling in her usual health until June she recalls onset of symptoms after using a gas leaf blower at her home during which time she experienced some mild right wrist pain.  She does not recall any particular injury event at this time.  However she experienced progressive ongoing increase in pain with swelling and stiffness in her right wrist especially with decreased range of motion and pain over the dorsal aspect.  This is evaluated at orthopedics clinic with aspiration that revealed just coagulated sample trial of steroid medication and only partially improved symptoms then continued worsening.  She was admitted to the hospital for tenosynovectomy that was performed without complication.  Intraoperatively reported extensive inflammatory tissue debridement without any  evidence concerning for infection.  During this hospitalization she also developed knee effusion which was aspirated demonstrating inflammatory synovial fluid with negative microscopy and cultures.  Is now almost 3 weeks since her surgery she continues experiencing severe pain intensity swelling around the right hand and wrist and has developed some skin blistering.     Labs reviewed 08/2020 Synovial fluid knee 4,525 WBCs 90% neutrophils ANA neg RF 14.2 CCP neg ESR 37 CRP 14.1 Uric acid 4.9   07/2020 Synovial fluid wrist clotted sample negative gram stain   No Rheumatology ROS completed.   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   History of pulmonary embolism 12/08/2020   Arthritis of knee 12/06/2020   Bilateral knee pain 09/20/2020   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   High risk medication use 09/20/2020   Extensor tenosynovitis of right wrist 09/15/2020   S/P reverse total shoulder arthroplasty, 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   Lung nodule 01/02/2020   Status post arthroscopy of left shoulder 01/16/2018   Estrogen deficiency 06/28/2017   Medial epicondylitis, left elbow 04/29/2017   S/P arthroscopy of left shoulder 02/25/2017   Impingement syndrome of left shoulder 01/30/2017   Trochanteric bursitis, right hip 04/10/2016   Hypertension 09/02/2015   Urge incontinence 07/27/2015   Obesity (BMI 30-39.9) 04/28/2015   Osteoarthritis of left knee  01/28/2015   Status post total left knee replacement 01/28/2015   Vitamin D deficiency 04/27/2014   Seasonal and perennial allergic rhinitis 06/17/2013   S/P total hip arthroplasty 03/20/2013   Medicare annual wellness visit, subsequent 11/18/2012   History of falling 11/18/2012   Routine general medical examination at a health care facility 11/10/2012   Lichen sclerosus et atrophicus of the vulva 07/29/2012   Osteopenia 10/19/2011   Hypothyroidism 10/19/2011   Hereditary and idiopathic peripheral neuropathy 08/05/2009   Low back pain 08/05/2009   HAND PAIN, BILATERAL 08/05/2009   TREMOR 03/09/2008   MENOPAUSAL SYNDROME 10/09/2007   DEPRESSION, MAJOR 05/20/2007   BIPOLAR AFFECTIVE DISORDER 05/20/2007   Generalized anxiety disorder 05/20/2007   PERSONALITY DISORDER 05/20/2007   ESOPHAGEAL SPASM 05/20/2007   Diaphragmatic hernia 05/20/2007   COPD mixed type (HCC) 03/27/2007   HYPERCHOLESTEROLEMIA, PURE 12/10/2006   SYMPTOM, SYNDROME,  CHRONIC FATIGUE 12/10/2006    Past Medical History:  Diagnosis Date   Allergy Hayfever   1958   Amaurosis fugax    Anemia    hx   Anxiety    Arthritis    Asthma    Cataract    Chronic fatigue syndrome    Chronic kidney disease    frequency, nephritis when 72 yrs old   COPD (chronic obstructive pulmonary disease) (HCC)    COVID-19    Depression    Diverticulitis    Emphysema of lung (HCC)    Fibromyalgia    GERD (gastroesophageal reflux disease)    occ   H/O hiatal hernia    History of bronchitis    Hyperlipidemia    Hypothyroidism    Interstitial cystitis    Irritable bowel syndrome    Lichen sclerosus    Lumbar herniated disc    Migraines    Motor nerve conduction block 11/2021   Osteoporosis 09/10/2022   Pneumonia    PONV (postoperative nausea and vomiting)    Shortness of breath    exertion    Thyroid disease    Graves   Urinary frequency    Urinary tract infection    Vertigo     Family History  Problem Relation Age  of Onset   Arthritis Mother    Hypertension Mother    Kidney disease Mother    Heart disease Mother    Stroke Mother    Cancer Father        Bladder   Arthritis Sister    Heart disease Sister    Colon cancer Other    Cancer - Lung Cousin    Arthritis Maternal Grandmother    Hearing loss Maternal Grandfather    Varicose Veins Maternal Uncle    Past Surgical History:  Procedure Laterality Date   ABDOMINAL HYSTERECTOMY     bladder sugery      CAROTID DOPPLAR     COLONOSCOPY  05/26/2010   avms- otherwise nl , re check 10y   DEXA-OSTEOPENIA     DOPPLER ECHOCARDIOGRAPHY     elbow surgery     EPICONDYLITIS     EYE SURGERY Bilateral    cataracts   FOOT SURGERY Bilateral    I & D EXTREMITY Right 09/01/2020   Procedure: TYNOSYNOVECTOMY;  Surgeon: [name], MD;  Location: [facility] OR;  Service: Orthopedics;  Laterality: Right;   IMPLANTATION VAGAL NERVE STIMULATOR  03/2022   JOINT REPLACEMENT     KNEE SURGERY     Left cartilage   lipoma in second finger right hand     MOUTH SURGERY  08/22/2022   PLANTAR FASCIA SURGERY Left    REVERSE SHOULDER ARTHROPLASTY Left 02/02/2020   Procedure: LEFT REVERSE SHOULDER ARTHROPLASTY;  Surgeon: [name], MD;  Location[facility] OR;  Service: Orthopedics;  Laterality: Left;   ROTATOR CUFF REPAIR Left 12/2017   TEMPOROMANDIBULAR JOINT SURGERY     TONSILLECTOMY     TOTAL HIP ARTHROPLASTY Right 03/20/2013   Procedure: Right TOTAL HIP ARTHROPLASTY;  Surgeon: [name], MD;  Location: [facility] OR;  Service: Orthopedics;  Laterality: Right;  Right Total Hip Arthroplasty   TOTAL KNEE ARTHROPLASTY Left 01/28/2015   Procedure: LEFT TOTAL KNEE ARTHROPLASTY;  Surgeon: [name], MD;  Location: [facility] ORS;  Service: Orthopedics;  Laterality: Left;   TRIGGER FINGER RELEASE Right    TROCHANTERIC BURSA EXCISION Right 05/03/2016   Dr. Doneen Poisson   WRIST ARTHROSCOPY Right  ligament tear   Social History   Social History  Narrative   Not on file   Immunization History  Administered Date(s) Administered   Influenza Split 11/19/2011, 11/02/2013   Influenza Whole 12/20/2006, 11/25/2008, 11/26/2009, 11/24/2010   Influenza, High Dose Seasonal PF 12/13/2016, 10/30/2022   Influenza,inj,Quad PF,6+ Mos 12/06/2015, 11/28/2021   Influenza-Unspecified 12/08/2012, 12/03/2014, 12/08/2017, 11/10/2018, 10/31/2019   Moderna Sars-Covid-2 Vaccination 06/21/2019, 07/19/2019, 01/22/2020   PFIZER(Purple Top)SARS-COV-2 Vaccination 06/04/2020, 10/30/2022   Pfizer Covid-19 Vaccine Bivalent Booster 65yrs & up 12/29/2021   Pneumococcal Conjugate-13 12/06/2015   Pneumococcal Polysaccharide-23 01/26/2002, 03/23/2008, 09/07/2010, 06/28/2017   Respiratory Syncytial Virus Vaccine,Recomb Aduvanted(Arexvy) 12/29/2021   Td 06/26/2000, 10/19/2011, 02/14/2022   Zoster Recombinant(Shingrix) 09/16/2017, 08/11/2022     Objective: Vital Signs: There were no vitals taken for this visit.   Physical Exam   Musculoskeletal Exam: ***  CDAI Exam: CDAI Score: -- Patient Global: --; Provider Global: -- Swollen: --; Tender: -- Joint Exam 12/17/2022   No joint exam has been documented for this visit   There is currently no information documented on the homunculus. Go to the Rheumatology activity and complete the homunculus joint exam.  Investigation: No additional findings.  Imaging: No results found.  Recent Labs: Lab Results  Component Value Date   WBC 6.2 10/19/2022   HGB 11.9 10/19/2022   PLT 195 10/19/2022   NA 145 10/19/2022   K 4.6 10/19/2022   CL 103 10/19/2022   CO2 30 10/19/2022   GLUCOSE 82 10/19/2022   BUN 26 (H) 10/19/2022   CREATININE 1.13 (H) 10/19/2022   BILITOT 0.6 10/19/2022   ALKPHOS 82 07/13/2022   AST 14 10/19/2022   ALT 10 10/19/2022   PROT 6.2 10/19/2022   ALBUMIN 3.8 07/13/2022   CALCIUM 9.5 10/19/2022   GFRAA >60 01/29/2015   QFTBGOLDPLUS NEGATIVE 11/07/2022    Speciality Comments: No  specialty comments available.  Procedures:  No procedures performed Allergies: Lithium; Tegretol [carbamazepine]; Tricyclic antidepressants; Methotrexate derivatives; Strawberry extract; Tapentadol; Codeine; Cymbalta [duloxetine hcl]; Erythromycin; Lyrica [pregabalin]; Neurontin [gabapentin]; Rabeprazole sodium; Baclofen; Duraprep [antiseptic products, misc.]; Penicillins; and Tape   Assessment / Plan:     Visit Diagnoses: No diagnosis found.  ***  Orders: No orders of the defined types were placed in this encounter.  No orders of the defined types were placed in this encounter.    Follow-Up Instructions: No follow-ups on file.   Metta Clines, RT  Note - This record has been created using [employer].  Chart creation errors have been sought, but may not always  have been located. Such creation errors do not reflect on  the standard of medical care.

## 2022-12-04 NOTE — Progress Notes (Unsigned)
Care Coordination Note  12/04/2022 Name: TRASHAWN FAKHOURY MRN: [id number] DOB: Apr 13, 1950  FREDERICA MAPLE is a 72 y.o. year old female who is a primary care patient of Tower, Audrie Gallus, MD and is actively engaged with the care management team. I reached out to [name] by phone today to assist with re-scheduling a follow up visit with the RN Case Manager  Follow up plan: Unsuccessful telephone outreach attempt made. A HIPAA compliant phone message was left for the patient providing contact information and requesting a return call.   Burman Nieves, CCMA Care Coordination Care Guide Direct Dial: [phone]

## 2022-12-06 ENCOUNTER — Ambulatory Visit: Payer: Self-pay

## 2022-12-06 NOTE — Progress Notes (Signed)
Care Coordination Note  12/06/2022 Name: NAILEA WALLING MRN: [id number] DOB: 01-08-1951  CANDITA KOPP is a 72 y.o. year old female who is a primary care patient of Tower, Audrie Gallus, MD and is actively engaged with the care management team. I reached out to [name] by phone today to assist with re-scheduling a follow up visit with the RN Case Manager  Follow up plan: Telephone appointment with care management team member scheduled for:12/19/2022  Burman Nieves, [facility] Care Coordination Care Guide Direct Dial: [phone]

## 2022-12-06 NOTE — Patient Outreach (Signed)
Care Coordination   Follow Up Visit Note   12/06/2022 Name: Lori Orozco MRN: [id number] DOB: 1950-10-14  Lori Orozco is a 72 y.o. year old female who sees Tower, Audrie Gallus, MD for primary care. I spoke with  Lori Orozco by phone today.  What matters to the patients health and wellness today?  Patient wants Meals on Wheels.    Goals Addressed             This Visit's Progress    Housing and Food resources       Interventions Today    Flowsheet Row Most Recent Value  General Interventions   General Interventions Discussed/Reviewed General Interventions Discussed, General Interventions Reviewed, Communication with  [Pt reports she relocated with her sister yesterday and has everything she needs. Pt has not received paperwork from [employer]. SW t/c [employer] and there is no paperwork to be mailed. Pt will need to call tomorrow when staff returns.]              SDOH assessments and interventions completed:  No     Care Coordination Interventions:  Yes, provided   Follow up plan: Follow up call scheduled for 12/10/22 at 2pm.    Encounter Outcome:  Patient Visit Completed

## 2022-12-06 NOTE — Patient Instructions (Signed)
Visit Information  Thank you for taking time to visit with me today. Please don't hesitate to contact me if I can be of assistance to you.   Following are the goals we discussed today:   Patient will contact [employer] 12/07/22 to add name to waiting list.   Our next appointment is by telephone on 12/10/22 at 2pm  Please call the care guide team at [phone] if you need to cancel or reschedule your appointment.   If you are experiencing a Mental Health or Behavioral Health Crisis or need someone to talk to, please call 911  Patient verbalizes understanding of instructions and care plan provided today and agrees to view in MyChart. Active MyChart status and patient understanding of how to access instructions and care plan via MyChart confirmed with patient.     Telephone follow up appointment with care management team member scheduled for: 12/10/22 at 2pm.  Lysle Morales, BSW Social Worker [phone]

## 2022-12-10 ENCOUNTER — Ambulatory Visit: Payer: Self-pay

## 2022-12-10 NOTE — Patient Outreach (Signed)
Care Coordination   Follow Up Visit Note   12/10/2022 Name: Lori Orozco MRN: [id number] DOB: [birth year]  Lori Orozco is a 72 y.o. year old female who sees Tower, Audrie Gallus, MD for primary care. I spoke with  Lori Orozco by phone today.  What matters to the patients health and wellness today?  Patient needs food assistance.    Goals Addressed             This Visit's Progress    Housing and Food resources       Interventions Today    Flowsheet Row Most Recent Value  General Interventions   General Interventions Discussed/Reviewed General Interventions Discussed, General Interventions Reviewed, Communication with  [Pt reports no reply from [employer] to add name to waiting list.  Pt agreed to call again if no reply by 12/12/22]  Communication with --  [SW t/c [facility] and left vm to contact patient or SW to add name to waiting list.]              SDOH assessments and interventions completed:  No     Care Coordination Interventions:  Yes, provided   Follow up plan: Follow up call scheduled for 12/17/22 at 11:30am     Encounter Outcome:  Patient Visit Completed

## 2022-12-10 NOTE — Patient Instructions (Signed)
Visit Information  Thank you for taking time to visit with me today. Please don't hesitate to contact me if I can be of assistance to you.   Following are the goals we discussed today:  Patient to follow up with [employer] Whe[employer] if no response by 12/12/22.   Our next appointment is by telephone on 12/17/22 at 11:30am  Please call the care guide team at [phone] if you need to cancel or reschedule your appointment.   If you are experiencing a Mental Health or Behavioral Health Crisis or need someone to talk to, please call 911  Patient verbalizes understanding of instructions and care plan provided today and agrees to view in MyChart. Active MyChart status and patient understanding of how to access instructions and care plan via MyChart confirmed with patient.     Telephone follow up appointment with care management team member scheduled for: 12/17/22 at 11:30am   Lysle Morales, BSW Social Worker [phone]

## 2022-12-12 ENCOUNTER — Ambulatory Visit: Payer: Medicare Other | Admitting: Internal Medicine

## 2022-12-12 ENCOUNTER — Ambulatory Visit: Payer: Self-pay

## 2022-12-12 NOTE — Patient Instructions (Signed)
Visit Information  Thank you for taking time to visit with me today. Please don't hesitate to contact me if I can be of assistance to you.   Following are the goals we discussed today:  Patient will follow up with [employer] on the status of her application.   If you are experiencing a Mental Health or Behavioral Health Crisis or need someone to talk to, please call 911  Patient verbalizes understanding of instructions and care plan provided today and agrees to view in MyChart. Active MyChart status and patient understanding of how to access instructions and care plan via MyChart confirmed with patient.     No further follow up required: Patient does not request a follow up contact.   Lysle Morales, BSW Social Worker [phone]

## 2022-12-12 NOTE — Patient Outreach (Signed)
Care Coordination   Follow Up Visit Note   12/12/2022 Name: Lori Orozco MRN: [id number] DOB: Jul 12, 1950  Lori Orozco is a 72 y.o. year old female who sees Tower, Audrie Gallus, MD for primary care. I spoke with  Lori Orozco by phone today.  What matters to the patients health and wellness today?  Patient received call from [employer] that her application has not been processed.    Goals Addressed             This Visit's Progress    Housing and Food resources       Interventions Today    Flowsheet Row Most Recent Value  General Interventions   General Interventions Discussed/Reviewed General Interventions Discussed, General Interventions Reviewed  [Pt received a call from [employer] that her application has not been processed. That conflicts w/what SW was previously told by staff. Pt doesn't have an upcoming medical appointment and will wait for her application to be processed.]              SDOH assessments and interventions completed:  No     Care Coordination Interventions:  Yes, provided   Follow up plan: No further intervention required.   Encounter Outcome:  Patient Visit Completed

## 2022-12-16 ENCOUNTER — Other Ambulatory Visit: Payer: Self-pay | Admitting: Internal Medicine

## 2022-12-16 DIAGNOSIS — M8000XS Age-related osteoporosis with current pathological fracture, unspecified site, sequela: Secondary | ICD-10-CM

## 2022-12-17 ENCOUNTER — Ambulatory Visit: Payer: Medicare Other | Admitting: Internal Medicine

## 2022-12-17 ENCOUNTER — Ambulatory Visit: Payer: Self-pay

## 2022-12-17 DIAGNOSIS — Z79899 Other long term (current) drug therapy: Secondary | ICD-10-CM

## 2022-12-17 DIAGNOSIS — M8000XS Age-related osteoporosis with current pathological fracture, unspecified site, sequela: Secondary | ICD-10-CM

## 2022-12-17 DIAGNOSIS — Z7952 Long term (current) use of systemic steroids: Secondary | ICD-10-CM

## 2022-12-17 DIAGNOSIS — M0600 Rheumatoid arthritis without rheumatoid factor, unspecified site: Secondary | ICD-10-CM

## 2022-12-17 NOTE — Patient Outreach (Signed)
Care Coordination   Follow Up Visit Note   12/17/2022 Name: Lori Orozco MRN: [id number] DOB: [birth year]  Lori Orozco is a 72 y.o. year old female who sees Tower, Audrie Gallus, MD for primary care. I spoke with  [name] by phone today.  What matters to the patients health and wellness today?  Patient has access to [employer] and is on the waiting list for [employer]. Patient has no additional unmet need.    Goals Addressed             This Visit's Progress    Housing and Food resources       Interventions Today    Flowsheet Row Most Recent Value  Chronic Disease   Chronic disease during today's visit Hypertension (HTN)  General Interventions   General Interventions Discussed/Reviewed General Interventions Discussed, General Interventions Reviewed  [Pt spoke with [employer] and application for [employer] is complete and she is on the waiting list for [employer].  Pt has no other unmet needs at this time.]              SDOH assessments and interventions completed:  No     Care Coordination Interventions:  Yes, provided   Follow up plan: No further intervention required.   Encounter Outcome:  Patient Visit Completed

## 2022-12-17 NOTE — Patient Instructions (Signed)
Visit Information  Thank you for taking time to visit with me today. Please don't hesitate to contact me if I can be of assistance to you.   Following are the goals we discussed today:  Patient achieved goal to access [employer] and [employer].   If you are experiencing a Mental Health or Behavioral Health Crisis or need someone to talk to, please call 911  Patient verbalizes understanding of instructions and care plan provided today and agrees to view in MyChart. Active MyChart status and patient understanding of how to access instructions and care plan via MyChart confirmed with patient.     No further follow up required: Patient does not request an additional follow up.  Lysle Morales, BSW Social Worker [phone]

## 2022-12-18 ENCOUNTER — Encounter: Payer: Self-pay | Admitting: Family Medicine

## 2022-12-19 ENCOUNTER — Encounter: Payer: Self-pay | Admitting: Pharmacist

## 2022-12-19 ENCOUNTER — Ambulatory Visit: Payer: Self-pay

## 2022-12-19 MED ORDER — METOPROLOL SUCCINATE ER 25 MG PO TB24: 12.5000 mg | ORAL_TABLET | Freq: Every day | ORAL | 1 refills | Status: DC

## 2022-12-19 MED ORDER — LEVOTHYROXINE SODIUM 50 MCG PO TABS: 50.0000 ug | ORAL_TABLET | Freq: Every day | ORAL | 1 refills | Status: DC

## 2022-12-19 MED ORDER — BREZTRI AEROSPHERE 160-9-4.8 MCG/ACT IN AERO: 2.0000 | INHALATION_SPRAY | Freq: Two times a day (BID) | RESPIRATORY_TRACT | 3 refills | Status: DC

## 2022-12-19 NOTE — Addendum Note (Signed)
Addended by: Wendie Simmer B on: 12/19/2022 02:42 PM   Modules accepted: Orders

## 2022-12-19 NOTE — Patient Outreach (Signed)
Care Coordination   Follow Up Visit Note   12/19/2022 Name: Lori Orozco MRN: [id number] DOB: [birth year]  Lori Orozco is a 72 y.o. year old female who sees Tower, Audrie Gallus, MD for primary care. I spoke with  [name] by phone today.  What matters to the patients health and wellness today?  Patient reports having surgical clearance with primary provider on 10/30/22 and is scheduled for pulmonary visit for clearance on 12/20/22.  Patient states her back has not bothered her as much recently.  She reports pain level is a 4-5 which has been more tolerable. Patient states she continues to use E-stim for pain management.   Patient reports sustaining a fall on  12/17/22. She states her sister called EMS and she was checked out.   She states she thinks she lost her footing.  Patient state she wears rubber sole house shoes and tennis shoes when going outside of the home and uses a walker for ambulation.   She states sometimes her foot just catches on the floor.   Patient reports moving in with her sister recently for additional support.  Patient states she needs help for medication assistance with her Breztri.    Goals Addressed             This Visit's Progress    assistance with community resources and management of chronic health conditions       Interventions Today    Flowsheet Row Most Recent Value  Chronic Disease   Chronic disease during today's visit Other  [RA, Chronic back/ leg pain and falls.]  General Interventions   General Interventions Discussed/Reviewed General Interventions Reviewed, Doctor Visits  [evaluation of current treatment plan for mentioned health conditions and patients adherence to plan as established by provider. Assessed for ongoing back pain and if all surgical clearances have been obtained.]  Doctor Visits Discussed/Reviewed Doctor Visits Reviewed  Algis Downs to keep follow up visits with providers. Reviewed upcoming provider visits.]  Education  Interventions   Education Provided Provided Education  [Advised patient to continue using ambulatory device for safety.  Advised patient to be mindful when walking and try to pick up feet instead of dragging them. Confirmed patient has arm rails around toilet]  Pharmacy Interventions   Pharmacy Dicussed/Reviewed Pharmacy Topics Reviewed  Algis Downs to take medications as prescribed.]  Referral to Pharmacist Cannot afford medications  [facility] sent to practice pharmacist for assistance with medication assistance for Breztri.]  Safety Interventions   Safety Discussed/Reviewed Fall Risk, Safety Reviewed  Pearletha Furl for falls/ injury. Discussed fall precautions related to new home enviornment. Advised to reports any falls to provider.]              SDOH assessments and interventions completed:  No     Care Coordination Interventions:  Yes, provided   Follow up plan: Follow up call scheduled for 01/17/23 at 3 pm    Encounter Outcome:  Patient Visit Completed   George Ina RN,BSN,CCM [facility] Care Coordination [phone] direct line

## 2022-12-19 NOTE — Progress Notes (Signed)
Subjective:    Patient ID: Lori Orozco, female    DOB: [birth year], 72 y.o.   MRN: [id number]  HPI  female former cigarette/ VAPE smoker followed for COPD, complicated by bipolar, GERD, Hiatal Hernia, Hypothyroid, Peripheral Neuropathy, Covid infection/MAB infusion Jan, 2021, DVT/PE, Rheumatoid Arthritis/ Enbrel,  PFT 10/09/10- Office spirometry Moderate obstruction and restriction of exhaled volume Quantiferon TB Gold- 11/07/22- NEG -----------------------------------------------------------------------------   12/19/21- 72 year old female former smoker (80 pkyrs) followed for COPD,  Lung Nodules,, nicotine use, Allergic Rhinitis, complicated by Bipolar disorder, Hiatal Hernia, Hypothyroid, Peripheral Neuropathy, Covid infection/MAB infusion Jan, 2021,  -Flonase, Proair hfa, neb, Duoneb, Symbicort 160, prednisone 10 qd,  Covid vax- 3 Moderna, 1 Phizer Flu vax- had Needs Flonase refilled but reports breathing has been very good with no exacerbation or significant symptoms. Substantially limited by DDD low back pain. Has f/u next week with specialist in [place] pending nerve block/ stimulator. CXR 06/29/21 No acute cardiopulmonary process.  12/20/22- 72 year old female former smoker (80 pkyrs) followed for COPD,  Lung Nodules,,hx nicotine use, Allergic Rhinitis, complicated by Bipolar disorder, Hiatal Hernia, Hypothyroid, Peripheral Neuropathy, Covid infection/MAB infusion Jan, 2021,  -Flonase, Proair hfa, neb, Duoneb, Candelaria Arenas,  Prednisone 10 mg daily,  Covid vax- 3 Moderna, 1 Phizer ACT score 21 Had flu vax She feels she is breathing well with no recent exacerbations.  Has had flu and COVID vaccines this fall.  Denies discolored sputum, wheezing, chest pain or palpitation. Hx of lung nodules- will update CXR. She has fallen recently and actually found her back pain was bothering her less so she says she intends to defer back surgery.  She is also considering surgery for arthritis of right  hand, with no timing established. Risk assessment-currently stable without acute problems.  I do not identify problems that must be immediately addressed before anticipated surgery.  She would be at some increased risk for surgery related complications involving respiration and possibly cardiac status.  Will consider full pulmonary function testing on return.  ROS-see HPI   + = positive Constitutional:   No-   weight loss, night sweats, fevers, chills, fatigue, lassitude. HEENT:   No-  headaches, difficulty swallowing, tooth/dental problems, sore throat,       No-  sneezing, itching, ear ache, nasal congestion, post nasal drip,  CV:  No-   chest pain, orthopnea, PND, swelling in lower extremities, anasarca, dizziness, palpitations Resp: shortness of breath with exertion or at rest.              No- productive cough,   non-productive cough,  No- coughing up of blood.               No-   change in color of mucus.  No- wheezing.   Skin: No-   rash or lesions. GI:  No-   heartburn, indigestion, abdominal pain, nausea, vomiting,  GU: MS:  + joint pain or swelling.  Marland Kitchen+ low back pain Neuro-     nothing unusual Psych:  No- change in mood or affect. depression or +anxiety.  No memory loss.  Objective:   Physical Exam  General- Alert, Oriented, Affect-appropriate, Distress-+moderate discomfort back pain.  +rolling walker, +obese Skin- rash-none, lesions- none, excoriation- none Lymphadenopathy- none Head- atraumatic            Eyes- Gross vision intact, PERRLA, conjunctivae clear secretions            Ears- Hearing, canals-normal            Nose- Clear,  no-Septal dev, mucus, polyps, erosion, perforation             Throat- Mallampati III-IV , mucosa clear , drainage- none, tonsils- atrophic Neck- flexible , trachea midline, no stridor , thyroid nl, carotid no bruit Chest - symmetrical excursion , unlabored           Heart/CV- RRR , no murmur , no gallop  , no rub, nl s1 s2                            - JVD- none , edema- none, stasis changes- none, varices- none           Lung- +clear/ diminished , unlabored, cough-none , dullness-none, rub- none           Chest wall-   Abd-  Br/ Gen/ Rectal- Not done, not indicated Extrem- +jiggling legs Neuro-

## 2022-12-19 NOTE — Patient Instructions (Signed)
Visit Information  Thank you for taking time to visit with me today. Please don't hesitate to contact me if I can be of assistance to you.   Following are the goals we discussed today:   Goals Addressed             This Visit's Progress    assistance with community resources and management of chronic health conditions       Interventions Today    Flowsheet Row Most Recent Value  Chronic Disease   Chronic disease during today's visit Other  [RA, Chronic back/ leg pain and falls.]  General Interventions   General Interventions Discussed/Reviewed General Interventions Reviewed, Doctor Visits  [evaluation of current treatment plan for mentioned health conditions and patients adherence to plan as established by provider. Assessed for ongoing back pain and if all surgical clearances have been obtained.]  Doctor Visits Discussed/Reviewed Doctor Visits Reviewed  Algis Downs to keep follow up visits with providers. Reviewed upcoming provider visits.]  Education Interventions   Education Provided Provided Education  [Advised patient to continue using ambulatory device for safety.  Advised patient to be mindful when walking and try to pick up feet instead of dragging them. Confirmed patient has arm rails around toilet]  Pharmacy Interventions   Pharmacy Dicussed/Reviewed Pharmacy Topics Reviewed  Algis Downs to take medications as prescribed.]  Referral to Pharmacist Cannot afford medications  [facility] sent to practice pharmacist for assistance with medication assistance for Breztri.]  Safety Interventions   Safety Discussed/Reviewed Fall Risk, Safety Reviewed  Pearletha Furl for falls/ injury. Discussed fall precautions related to new home enviornment. Advised to reports any falls to provider.]              Our next appointment is by telephone on 01/17/23 at 3 pm  Please call the care guide team at [phone] if you need to cancel or reschedule your appointment.   If you are experiencing a  Mental Health or Behavioral Health Crisis or need someone to talk to, please call the Suicide and Crisis Lifeline: 988 call 1-800-273-TALK (toll free, 24 hour hotline)  Patient verbalizes understanding of instructions and care plan provided today and agrees to view in MyChart. Active MyChart status and patient understanding of how to access instructions and care plan via MyChart confirmed with patient.     George Ina RN,BSN,CCM [facility] Care Coordination [phone] direct line

## 2022-12-20 ENCOUNTER — Ambulatory Visit (INDEPENDENT_AMBULATORY_CARE_PROVIDER_SITE_OTHER): Payer: Medicare Other | Admitting: Internal Medicine

## 2022-12-20 ENCOUNTER — Encounter: Payer: Self-pay | Admitting: Internal Medicine

## 2022-12-20 ENCOUNTER — Ambulatory Visit: Payer: Medicare Other

## 2022-12-20 VITALS — BP 112/70 | HR 76 | Ht 67.0 in | Wt 220.0 lb

## 2022-12-20 DIAGNOSIS — J449 Chronic obstructive pulmonary disease, unspecified: Secondary | ICD-10-CM

## 2022-12-20 DIAGNOSIS — R911 Solitary pulmonary nodule: Secondary | ICD-10-CM | POA: Diagnosis not present

## 2022-12-20 NOTE — Patient Instructions (Signed)
Order-CXR    dx COPD mixed type  We can continue current meds  Please call if we can help

## 2022-12-20 NOTE — Telephone Encounter (Signed)
Dr Maple Hudson, will you please add risk assessment to ov today before signing and let me know once this has been done? He needed to be cleared for Right L5-S1 Foraminotomy

## 2022-12-21 ENCOUNTER — Encounter: Payer: Self-pay | Admitting: Internal Medicine

## 2022-12-21 NOTE — Assessment & Plan Note (Signed)
Currently stable and at average risk for anticipated surgery.  Attention to cardiopulmonary status recommended during recovery. Plan-anticipate PFT in the future.  Continue current meds.

## 2022-12-21 NOTE — Assessment & Plan Note (Signed)
Plan- update CXR 

## 2022-12-23 NOTE — Progress Notes (Unsigned)
Office Visit Note  Patient: Lori Orozco             Date of Birth: 01/20/1951           MRN: [id number]             PCP: [name], MD Referring: Tower, Audrie Gallus, MD Visit Date: 12/24/2022   Subjective:  No chief complaint on file.   History of Present Illness: Lori Orozco is a 71 y.o. female here for follow up ***   Previous HPI 09/10/22 Lori Orozco is a 73 y.o. female here for follow up for seronegative RA on Enbrel 50 mg [place] weekly and prednisone 10 mg daily. She has significant pain in her back and legs interval workup indicating nerve impingement and is scheduled for injections with Dr. Alvester Morin for this. MRI also with vertebral fractures probably chronic. In her arms right wrist remains painful and decreased mobility worst on the ulnar side.   Previous HPI 05/30/22 Lori Orozco is a 72 y.o. female here for follow up for seronegative RA on Enbrel 50 mg subcu weekly sulfasalazine 1000 mg twice daily and prednisone 20 mg daily.  She was taking the medications without interruptions but started experiencing increased joint pain swelling and stiffness especially affecting her hands wrists and elbows since about 2 weeks ago.  She called about this and we increased her to 20 mg prednisone daily up from 10 mg and she has noticed improvement in symptoms especially her elbows.  No longer swollen and is able to tolerate resting her arms on a flat surface without severe pain.  She did suffer from upper respiratory symptoms last week but these have mostly resolved without any new medication needed.    Previous HPI 02/28/22 Lori Orozco is a 72 y.o. female here for follow up for seronegative rheumatoid arthritis on Enbrel 50 mg subcu weekly and prednisone 10 mg daily.  She had to stop taking the sulfasalazine since about 2 weeks ago due to running out of the medication.  She had change with insurance status so having a dramatically increased cost for her  medications at the moment that she is having to sort out.  She was tolerating the medicine okay.  She did not recall a noticeable difference in symptoms with starting the sulfasalazine but while off the medication has felt noticeably worse so thinks it was doing something.  Particularly the left wrist pain has been a lot better on treatment her right wrist continues to be bad before and after.  She saw Dr. Wynetta Emery for her neck recommendation was for physical therapy but she has not pursued this since and does not feel like she would tolerate this well based on previous therapy experiences.  She does have plans for trying the nerve stimulator treatment starting later this month.      Previous HPI 12/21/21 Lori Orozco is a 72 y.o. 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  She continues to have pain and stiffness affecting her hands bilaterally although the severity of swelling has gotten notably better on the Enbrel treatment but is far from resolved.  Her biggest concern at the moment is about her neck pain concern of cervical radiculopathy.  We checked x-ray that showed considerable multilevel degenerative disease with no acute appearing bony abnormality.  She is followed up with her spine doctor with epidural block injection tried did not find it very beneficial  at all.  She has discussed radiofrequency ablation as a neck step option for radicular pain.     Previous HPI 10/09/2021 Lori Orozco is a 72 y.o. female here for follow up for seronegative inflammatory arthritis on Enbrel 50 mg [place] weekly and prednisone 10 mg daily. She tried tapering prednisone but had severe worsening symptoms at 5 mg dose. She increased back to 10 mg daily and symptoms improved somewhat but still a lot of pain and swelling in both hands and wrists. She has ongoing back pain and stiffness and is seeing D.r Wynetta Emery for this with known significant lumbar spine  arthritis. More recently new problem with sensitivity and stinging type pain throughout both arms with even light pressure such as showering water. She also has worsening weakness in her proximal legs unable to stand without using arms, which are limited by pain.     Previous HPI 07/05/2021 Lori Orozco is a 72 y.o. female here for follow up for seronegative inflammatory arthritis on prednisone 20 mg daily dose and enbrel 50mg  subcutaneous once weekly. She has seen a large improvement in joint inflammation since starting the Enbrel. She has pain and redness around the injection site lasting up to 3 days with each dose. Wrists continue to hurt and have decreased movement left wrist actually more symptomatic than right now. She is working with physical therapy on getting back to walking currently very weak and unstable due to illness and deconditioning. She had recent ED visit for COPD exacerbation finished antibiotics for this. No other recent infection issues.   Previous HPI 02/08/2021 Lori Orozco is a 72 y.o. 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  She has had major medical events since her last visit.  She developed increased knee pain and swelling on the right side.  However she went to the hospital due to developing more weakness and shortness of breath CT angiogram identified bilateral pulmonary emboli.  Treatment with anticoagulation led to significant gastrointestinal bleeding from previously unknown AVM.  This was treated also in the hospital but overall protracted complications lasted for about a month hospitalization.  During this time she experienced worsening joint pain and swelling and significant deconditioning and weakness.  She was discharged from the hospital to rehab facility she was not continued on any steroid or other anti-inflammatory treatment reports joint pain and swelling affecting numerous joints in  bilateral elbows wrists fingers and knees.  This is limited any significant progress with physical therapy so far.  She called back to clinic earlier this month with the symptoms and was restarted on a prednisone taper currently down to 15 mg daily.  She saw orthopedics clinic for right knee aspiration injection 2 days ago that is partially helping.   Previous HPI 11/15/20 Lori Orozco is a 72 y.o. female here for follow up with wrist wrist extensor tenosynovitis and presumed seronegative inflammatory arthritis also involving knee joints after starting methotrexate 15 mg PO weekly and tapering prednisone off. Her wrist is slightly improved but remains very painful and swollen with decreased mobility. After starting methotrexate she has noticed some episodes of dizziness and urinary retention and frequency. She stopped the prednisone without noticing much difference in symptoms so far.     09/20/20 Lori Orozco is a 72 y.o. female here for joint pain and swelling of knees and wrists with severe tenosynovitis s/p tenosynovectomy on 09/01/20 with Dr. [name].  She had been  feeling in her usual health until June she recalls onset of symptoms after using a gas leaf blower at her home during which time she experienced some mild right wrist pain.  She does not recall any particular injury event at this time.  However she experienced progressive ongoing increase in pain with swelling and stiffness in her right wrist especially with decreased range of motion and pain over the dorsal aspect.  This is evaluated at orthopedics clinic with aspiration that revealed just coagulated sample trial of steroid medication and only partially improved symptoms then continued worsening.  She was admitted to the hospital for tenosynovectomy that was performed without complication.  Intraoperatively reported extensive inflammatory tissue debridement without any evidence concerning for infection.  During this hospitalization  she also developed knee effusion which was aspirated demonstrating inflammatory synovial fluid with negative microscopy and cultures.  Is now almost 3 weeks since her surgery she continues experiencing severe pain intensity swelling around the right hand and wrist and has developed some skin blistering.     Labs reviewed 08/2020 Synovial fluid knee 4,525 WBCs 90% neutrophils ANA neg RF 14.2 CCP neg ESR 37 CRP 14.1 Uric acid 4.9   07/2020 Synovial fluid wrist clotted sample negative gram stain   No Rheumatology ROS completed.   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   History of pulmonary embolism 12/08/2020   Arthritis of knee 12/06/2020   Bilateral knee pain 09/20/2020   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   High risk medication use 09/20/2020   Extensor tenosynovitis of right wrist 09/15/2020   S/P reverse total shoulder arthroplasty, 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   Lung nodule 01/02/2020   Status post arthroscopy of left shoulder 01/16/2018   Estrogen deficiency 06/28/2017   Medial epicondylitis, left elbow 04/29/2017   S/P arthroscopy of left shoulder 02/25/2017   Impingement syndrome of left shoulder 01/30/2017   Trochanteric bursitis, right hip 04/10/2016   Hypertension 09/02/2015   Urge incontinence 07/27/2015   Obesity (BMI 30-39.9) 04/28/2015   Osteoarthritis of left knee 01/28/2015   Status post total left knee replacement 01/28/2015    Vitamin D deficiency 04/27/2014   Seasonal and perennial allergic rhinitis 06/17/2013   S/P total hip arthroplasty 03/20/2013   Medicare annual wellness visit, subsequent 11/18/2012   History of falling 11/18/2012   Routine general medical examination at a health care facility 11/10/2012   Lichen sclerosus et atrophicus of the vulva 07/29/2012   Osteopenia 10/19/2011   Hypothyroidism 10/19/2011   Hereditary and idiopathic peripheral neuropathy 08/05/2009   Low back pain 08/05/2009   HAND PAIN, BILATERAL 08/05/2009   TREMOR 03/09/2008   MENOPAUSAL SYNDROME 10/09/2007   DEPRESSION, MAJOR 05/20/2007   BIPOLAR AFFECTIVE DISORDER 05/20/2007   Generalized anxiety disorder 05/20/2007   PERSONALITY DISORDER 05/20/2007   ESOPHAGEAL SPASM 05/20/2007   Diaphragmatic hernia 05/20/2007   COPD mixed type (HCC) 03/27/2007   HYPERCHOLESTEROLEMIA, PURE 12/10/2006   SYMPTOM, SYNDROME, CHRONIC FATIGUE 12/10/2006    Past Medical History:  Diagnosis Date  Allergy Hayfever   1958   Amaurosis fugax    Anemia    hx   Anxiety    Arthritis    Asthma    Cataract    Chronic fatigue syndrome    Chronic kidney disease    frequency, nephritis when 72 yrs old   COPD (chronic obstructive pulmonary disease) (HCC)    COVID-19    Depression    Diverticulitis    Emphysema of lung (HCC)    Fibromyalgia    GERD (gastroesophageal reflux disease)    occ   H/O hiatal hernia    History of bronchitis    Hyperlipidemia    Hypothyroidism    Interstitial cystitis    Irritable bowel syndrome    Lichen sclerosus    Lumbar herniated disc    Migraines    Motor nerve conduction block 11/2021   Osteoporosis 09/10/2022   Pneumonia    PONV (postoperative nausea and vomiting)    Shortness of breath    exertion    Thyroid disease    [place]   Urinary frequency    Urinary tract infection    Vertigo     Family History  Problem Relation Age of Onset   Arthritis Mother    Hypertension Mother    Kidney  disease Mother    Heart disease Mother    Stroke Mother    Cancer Father        Bladder   Arthritis Sister    Heart disease Sister    Colon cancer Other    Cancer - Lung Cousin    Arthritis Maternal Grandmother    Hearing loss Maternal Grandfather    Varicose Veins Maternal Uncle    Past Surgical History:  Procedure Laterality Date   ABDOMINAL HYSTERECTOMY     bladder sugery      CAROTID DOPPLAR     COLONOSCOPY  05/26/2010   avms- otherwise nl , re check 10y   DEXA-OSTEOPENIA     DOPPLER ECHOCARDIOGRAPHY     elbow surgery     EPICONDYLITIS     EYE SURGERY Bilateral    cataracts   FOOT SURGERY Bilateral    I & D EXTREMITY Right 09/01/2020   Procedure: TYNOSYNOVECTOMY;  Surgeon: [name], MD;  Location: [facility] OR;  Service: Orthopedics;  Laterality: Right;   IMPLANTATION VAGAL NERVE STIMULATOR  03/2022   JOINT REPLACEMENT     KNEE SURGERY     Left cartilage   lipoma in second finger right hand     MOUTH SURGERY  08/22/2022   PLANTAR FASCIA SURGERY Left    REVERSE SHOULDER ARTHROPLASTY Left 02/02/2020   Procedure: LEFT REVERSE SHOULDER ARTHROPLASTY;  Surgeon: [name], MD;  Location[facility];  Service: Orthopedics;  Laterality: Left;   ROTATOR CUFF REPAIR Left 12/2017   TEMPOROMANDIBULAR JOINT SURGERY     TONSILLECTOMY     TOTAL HIP ARTHROPLASTY Right 03/20/2013   Procedure: Right TOTAL HIP ARTHROPLASTY;  Surgeon: [name], MD;  Location: [facility] OR;  Service: Orthopedics;  Laterality: Right;  Right Total Hip Arthroplasty   TOTAL KNEE ARTHROPLASTY Left 01/28/2015   Procedure: LEFT TOTAL KNEE ARTHROPLASTY;  Surgeon: [name], MD;  Location: [facility] ORS;  Service: Orthopedics;  Laterality: Left;   TRIGGER FINGER RELEASE Right    TROCHANTERIC BURSA EXCISION Right 05/03/2016   Dr. Doneen Poisson   WRIST ARTHROSCOPY Right    ligament tear   Social History   Social History Narrative  Not on file   Immunization History  Administered  Date(s) Administered   Influenza Split 11/19/2011, 11/02/2013   Influenza Whole 12/20/2006, 11/25/2008, 11/26/2009, 11/24/2010   Influenza, High Dose Seasonal PF 12/13/2016, 10/30/2022   Influenza,inj,Quad PF,6+ Mos 12/06/2015, 11/28/2021   Influenza-Unspecified 12/08/2012, 12/03/2014, 12/08/2017, 11/10/2018, 10/31/2019   Moderna Sars-Covid-2 Vaccination 06/21/2019, 07/19/2019, 01/22/2020   PFIZER(Purple Top)SARS-COV-2 Vaccination 06/04/2020, 10/30/2022   Pfizer Covid-19 Vaccine Bivalent Booster 37yrs & up 12/29/2021   Pneumococcal Conjugate-13 12/06/2015   Pneumococcal Polysaccharide-23 01/26/2002, 03/23/2008, 09/07/2010, 06/28/2017   Respiratory Syncytial Virus Vaccine,Recomb Aduvanted(Arexvy) 12/29/2021   Td 06/26/2000, 10/19/2011, 02/14/2022   Zoster Recombinant(Shingrix) 09/16/2017, 08/11/2022     Objective: Vital Signs: There were no vitals taken for this visit.   Physical Exam   Musculoskeletal Exam: ***  CDAI Exam: CDAI Score: -- Patient Global: --; Provider Global: -- Swollen: --; Tender: -- Joint Exam 12/24/2022   No joint exam has been documented for this visit   There is currently no information documented on the homunculus. Go to the Rheumatology activity and complete the homunculus joint exam.  Investigation: No additional findings.  Imaging: No results found.  Recent Labs: Lab Results  Component Value Date   WBC 6.2 10/19/2022   HGB 11.9 10/19/2022   PLT 195 10/19/2022   NA 145 10/19/2022   K 4.6 10/19/2022   CL 103 10/19/2022   CO2 30 10/19/2022   GLUCOSE 82 10/19/2022   BUN 26 (H) 10/19/2022   CREATININE 1.13 (H) 10/19/2022   BILITOT 0.6 10/19/2022   ALKPHOS 82 07/13/2022   AST 14 10/19/2022   ALT 10 10/19/2022   PROT 6.2 10/19/2022   ALBUMIN 3.8 07/13/2022   CALCIUM 9.5 10/19/2022   GFRAA >60 01/29/2015   QFTBGOLDPLUS NEGATIVE 11/07/2022    Speciality Comments: No specialty comments available.  Procedures:  No procedures  performed Allergies: Lithium; Tegretol [carbamazepine]; Tricyclic antidepressants; Methotrexate derivatives; Strawberry extract; Tapentadol; Codeine; Cymbalta [duloxetine hcl]; Erythromycin; Lyrica [pregabalin]; Neurontin [gabapentin]; Rabeprazole sodium; Baclofen; Duraprep [antiseptic products, misc.]; Penicillins; and Tape   Assessment / Plan:     Visit Diagnoses: No diagnosis found.  ***  Orders: No orders of the defined types were placed in this encounter.  No orders of the defined types were placed in this encounter.    Follow-Up Instructions: No follow-ups on file.   Fuller Plan, MD  Note - This record has been created using [employer].  Chart creation errors have been sought, but may not always  have been located. Such creation errors do not reflect on  the standard of medical care.

## 2022-12-24 ENCOUNTER — Ambulatory Visit: Payer: Medicare Other | Attending: Internal Medicine | Admitting: Internal Medicine

## 2022-12-24 ENCOUNTER — Encounter: Payer: Self-pay | Admitting: Internal Medicine

## 2022-12-24 ENCOUNTER — Telehealth: Payer: Self-pay | Admitting: Orthopedic Surgery

## 2022-12-24 VITALS — BP 134/77 | HR 69 | Resp 14 | Ht 67.0 in | Wt 221.0 lb

## 2022-12-24 DIAGNOSIS — M8000XS Age-related osteoporosis with current pathological fracture, unspecified site, sequela: Secondary | ICD-10-CM | POA: Diagnosis present

## 2022-12-24 DIAGNOSIS — M0600 Rheumatoid arthritis without rheumatoid factor, unspecified site: Secondary | ICD-10-CM | POA: Insufficient documentation

## 2022-12-24 DIAGNOSIS — R296 Repeated falls: Secondary | ICD-10-CM | POA: Diagnosis present

## 2022-12-24 DIAGNOSIS — R29898 Other symptoms and signs involving the musculoskeletal system: Secondary | ICD-10-CM | POA: Insufficient documentation

## 2022-12-24 DIAGNOSIS — G894 Chronic pain syndrome: Secondary | ICD-10-CM | POA: Diagnosis present

## 2022-12-24 DIAGNOSIS — G609 Hereditary and idiopathic neuropathy, unspecified: Secondary | ICD-10-CM | POA: Insufficient documentation

## 2022-12-24 DIAGNOSIS — Z79899 Other long term (current) drug therapy: Secondary | ICD-10-CM | POA: Diagnosis present

## 2022-12-24 NOTE — Telephone Encounter (Signed)
12/20/22 note faxed to [facility]

## 2022-12-24 NOTE — Telephone Encounter (Signed)
FYI: I spoke with the patient today in regards to scheduling her back surgery. We finally received the last clearance needed from her doctor's today to be able to schedule surgery. Per patient, the pain she was experiencing has stopped. She does not hurt at all with her back, and has decided to postpone surgery. Patient will call back if her pain returns.

## 2022-12-25 LAB — COMPLETE METABOLIC PANEL WITH GFR
AG Ratio: 1.5 (calc) (ref 1.0–2.5)
ALT: 10 U/L (ref 6–29)
AST: 15 U/L (ref 10–35)
Albumin: 4 g/dL (ref 3.6–5.1)
Alkaline phosphatase (APISO): 54 U/L (ref 37–153)
BUN/Creatinine Ratio: 18 (calc) (ref 6–22)
BUN: 21 mg/dL (ref 7–25)
CO2: 31 mmol/L (ref 20–32)
Calcium: 9.5 mg/dL (ref 8.6–10.4)
Chloride: 100 mmol/L (ref 98–110)
Creat: 1.14 mg/dL — ABNORMAL HIGH (ref 0.60–1.00)
Globulin: 2.7 g/dL (ref 1.9–3.7)
Glucose, Bld: 87 mg/dL (ref 65–99)
Potassium: 4.4 mmol/L (ref 3.5–5.3)
Sodium: 141 mmol/L (ref 135–146)
Total Bilirubin: 0.4 mg/dL (ref 0.2–1.2)
Total Protein: 6.7 g/dL (ref 6.1–8.1)
eGFR: 51 mL/min/{1.73_m2} — ABNORMAL LOW (ref >=60)

## 2022-12-25 LAB — CBC WITH DIFFERENTIAL/PLATELET
Absolute Lymphocytes: 3359 {cells}/uL (ref 850–3900)
Absolute Monocytes: 624 {cells}/uL (ref 200–950)
Basophils Absolute: 31 {cells}/uL (ref 0–200)
Basophils Relative: 0.3 %
Eosinophils Absolute: 42 {cells}/uL (ref 15–500)
Eosinophils Relative: 0.4 %
HCT: 41.1 % (ref 35.0–45.0)
Hemoglobin: 12.9 g/dL (ref 11.7–15.5)
MCH: 31.6 pg (ref 27.0–33.0)
MCHC: 31.4 g/dL — ABNORMAL LOW (ref 32.0–36.0)
MCV: 100.7 fL — ABNORMAL HIGH (ref 80.0–100.0)
MPV: 10.8 fL (ref 7.5–12.5)
Monocytes Relative: 6 %
Neutro Abs: 6344 {cells}/uL (ref 1500–7800)
Neutrophils Relative %: 61 %
Platelets: 226 10*3/uL (ref 140–400)
RBC: 4.08 10*6/uL (ref 3.80–5.10)
RDW: 13.8 % (ref 11.0–15.0)
Total Lymphocyte: 32.3 %
WBC: 10.4 10*3/uL (ref 3.8–10.8)

## 2022-12-25 LAB — CK: Total CK: 16 U/L — ABNORMAL LOW (ref 29–143)

## 2022-12-25 LAB — SEDIMENTATION RATE: Sed Rate: 25 mm/h (ref 0–30)

## 2022-12-26 ENCOUNTER — Encounter: Payer: Self-pay | Admitting: Physical Therapy

## 2022-12-26 ENCOUNTER — Other Ambulatory Visit: Payer: Self-pay

## 2022-12-26 ENCOUNTER — Ambulatory Visit (INDEPENDENT_AMBULATORY_CARE_PROVIDER_SITE_OTHER): Payer: Medicare Other | Admitting: Physical Therapy

## 2022-12-26 DIAGNOSIS — M6281 Muscle weakness (generalized): Secondary | ICD-10-CM

## 2022-12-26 DIAGNOSIS — R2689 Other abnormalities of gait and mobility: Secondary | ICD-10-CM

## 2022-12-26 DIAGNOSIS — M5459 Other low back pain: Secondary | ICD-10-CM

## 2022-12-26 DIAGNOSIS — R296 Repeated falls: Secondary | ICD-10-CM | POA: Diagnosis not present

## 2022-12-26 NOTE — Therapy (Signed)
OUTPATIENT PHYSICAL THERAPY EVALUATION   Patient Name: Lori Orozco MRN: [id number] DOB:[birth year], 72 y.o., female Today's Date: 12/26/2022  END OF SESSION:  PT End of Session - 12/26/22 1356     Visit Number 1    Number of Visits 15    Date for PT Re-Evaluation 02/20/23    Authorization Type MCR    Progress Note Due on Visit 10    PT Start Time 1300    PT Stop Time 1348    PT Time Calculation (min) 48 min    Activity Tolerance Patient tolerated treatment well             Past Medical History:  Diagnosis Date   Allergy Hayfever   1958   Amaurosis fugax    Anemia    hx   Anxiety    Arthritis    Asthma    Cataract    Chronic fatigue syndrome    Chronic kidney disease    frequency, nephritis when 72 yrs old   COPD (chronic obstructive pulmonary disease) (HCC)    COVID-19    Depression    Diverticulitis    Emphysema of lung (HCC)    Fibromyalgia    GERD (gastroesophageal reflux disease)    occ   H/O hiatal hernia    History of bronchitis    Hyperlipidemia    Hypothyroidism    Interstitial cystitis    Irritable bowel syndrome    Lichen sclerosus    Lumbar herniated disc    Migraines    Motor nerve conduction block 11/2021   Osteoporosis 09/10/2022   Pneumonia    PONV (postoperative nausea and vomiting)    Shortness of breath    exertion    Thyroid disease    Graves   Urinary frequency    Urinary tract infection    Vertigo    Past Surgical History:  Procedure Laterality Date   ABDOMINAL HYSTERECTOMY     bladder sugery      CAROTID DOPPLAR     COLONOSCOPY  05/26/2010   avms- otherwise nl , re check 10y   DEXA-OSTEOPENIA     DOPPLER ECHOCARDIOGRAPHY     elbow surgery     EPICONDYLITIS     EYE SURGERY Bilateral    cataracts   FOOT SURGERY Bilateral    I & D EXTREMITY Right 09/01/2020   Procedure: TYNOSYNOVECTOMY;  Surgeon: [name], MD;  Location: [facility] OR;  Service: Orthopedics;  Laterality: Right;   IMPLANTATION VAGAL  NERVE STIMULATOR  03/2022   JOINT REPLACEMENT     KNEE SURGERY     Left cartilage   lipoma in second finger right hand     MOUTH SURGERY  08/22/2022   PLANTAR FASCIA SURGERY Left    REVERSE SHOULDER ARTHROPLASTY Left 02/02/2020   Procedure: LEFT REVERSE SHOULDER ARTHROPLASTY;  Surgeon: [name], MD;  Location: [facility];  Service: Orthopedics;  Laterality: Left;   ROTATOR CUFF REPAIR Left 12/2017   TEMPOROMANDIBULAR JOINT SURGERY     TONSILLECTOMY     TOTAL HIP ARTHROPLASTY Right 03/20/2013   Procedure: Right TOTAL HIP ARTHROPLASTY;  Surgeon: [name], MD;  Location: [facility] OR;  Service: Orthopedics;  Laterality: Right;  Right Total Hip Arthroplasty   TOTAL KNEE ARTHROPLASTY Left 01/28/2015   Procedure: LEFT TOTAL KNEE ARTHROPLASTY;  Surgeon: [name], MD;  Location: [facility] OR[facility];  Service: Orthopedics;  Laterality: Left;   TRIGGER FINGER RELEASE Right    TROCHANTERIC BURSA EXCISION Right  05/03/2016   Dr. Doneen Poisson   WRIST ARTHROSCOPY Right    ligament tear   Patient Active Problem List   Diagnosis Date Noted   Recurrent falls 12/24/2022   Pre-operative clearance 10/30/2022   Diarrhea 10/19/2022   Cold intolerance 10/19/2022   Osteoporosis 09/10/2022   Laceration of forehead 08/29/2022   Thoracic compression fracture (HCC) 07/13/2022   Diabetes mellitus screening 07/13/2022   Lumbar herniated disc    Chronic pain 11/28/2021   Bilateral leg weakness 10/09/2021   Vitamin B12 deficiency 08/02/2021   Myofascial pain dysfunction syndrome 08/02/2021   Adverse effect of prednisone 07/11/2021   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   History of pulmonary embolism 12/08/2020   Arthritis of knee 12/06/2020   Bilateral knee pain 09/20/2020   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   High risk medication use 09/20/2020   Extensor tenosynovitis of right wrist 09/15/2020   S/P reverse total shoulder arthroplasty, 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   Lung  nodule 01/02/2020   Status post arthroscopy of left shoulder 01/16/2018   Estrogen deficiency 06/28/2017   Medial epicondylitis, left elbow 04/29/2017   S/P arthroscopy of left shoulder 02/25/2017   Impingement syndrome of left shoulder 01/30/2017   Trochanteric bursitis, right hip 04/10/2016   Hypertension 09/02/2015   Urge incontinence 07/27/2015   Obesity (BMI 30-39.9) 04/28/2015   Osteoarthritis of left knee 01/28/2015   Status post total left knee replacement 01/28/2015   Vitamin D deficiency 04/27/2014   Seasonal and perennial allergic rhinitis 06/17/2013   S/P total hip arthroplasty 03/20/2013   Medicare annual wellness visit, subsequent 11/18/2012   History of falling 11/18/2012   Routine general medical examination at a health care facility 11/10/2012   Lichen sclerosus et atrophicus of the vulva 07/29/2012   Osteopenia 10/19/2011   Hypothyroidism 10/19/2011   Hereditary and idiopathic peripheral neuropathy 08/05/2009   Low back pain 08/05/2009   HAND PAIN, BILATERAL 08/05/2009   TREMOR 03/09/2008   MENOPAUSAL SYNDROME 10/09/2007   DEPRESSION, MAJOR 05/20/2007   BIPOLAR AFFECTIVE DISORDER 05/20/2007   Generalized anxiety disorder 05/20/2007   PERSONALITY DISORDER 05/20/2007   ESOPHAGEAL SPASM 05/20/2007   Diaphragmatic hernia 05/20/2007   COPD mixed type (HCC) 03/27/2007   HYPERCHOLESTEROLEMIA, PURE 12/10/2006   SYMPTOM, SYNDROME, CHRONIC FATIGUE 12/10/2006    PCP: [name], MD   REFERRING PROVIDER: Fuller Plan, MD   REFERRING DIAG: R29.6 (ICD-10-CM) - Recurrent falls   Rationale for Evaluation and Treatment: Rehabilitation  THERAPY DIAG:  Other abnormalities of gait and mobility  Repeated falls  Muscle weakness (generalized)  Other low back pain  ONSET DATE: 12/17/22, most recent fall   SUBJECTIVE:  SUBJECTIVE STATEMENT: She just had a fall fell forward 12/17/22 in the bathroom striking on her left shoulder and left side of the neck and base of skull. She reports having 4 total falls in last 6 months..She relays pinched nerve in neck and she has spinal stimulator since February and has neuropathy. She is now using upright RW for ambulation and has very little standing tolerance due to back pain and leg weakness  PERTINENT HISTORY:  PMH includes: amaurosis Fugax, anxiety, chronic fatigue syndrome, CKD, COPD, COVID, Emphysema, Fibromyalgia, IBS, Linchen sclerosus, Migraines, vertigo, dexa - osteopenia, s/p bil. TKA, s/p Lt TSA, spinal stimulator  PAIN:  Are you having pain? Yes: NPRS scale: 5/10 Pain location: low back Pain description: constant sharp pain Aggravating factors: sitting in one position, standing too long,  Relieving factors: stimulator  PRECAUTIONS:  Fall  RED FLAGS: None   WEIGHT BEARING RESTRICTIONS:  No  FALLS:  Has patient fallen in last 6 months? Yes. Number of falls 4  LIVING ENVIRONMENT:  Stairs: yes, has chair lift she prefers to use Lives with sister  OCCUPATION:  none  PLOF:  Needs RW for ambulation, can dress herself, has to sponge bath, can not stand long enough for cooking or housework.   PATIENT GOALS: reduce falls, strength legs, improve standing tolernace   NEXT MD VISIT: 04/01/23   OBJECTIVE:  Note: Objective measures were completed at Evaluation unless otherwise noted.  PATIENT SURVEYS:  Eval: FOTO 38% functional, goal is 44%  COGNITIVE STATUS: Within functional limits for tasks assessed    GAIT: Eval Distance walked: 20 feet Assistive device utilized: Environmental consultant - 4 wheeled, upright Level of assistance: SBA Comments: decreased walking tolerance due to pain and weakness   LOWER EXTREMITY MMT:    MMT tested in sitting Right eval Left eval  Hip flexion 3 3  Hip extension     Hip abduction 3 3  Hip adduction    Hip internal rotation 3 3  Hip external rotation 3 3  Knee flexion 4 4  Knee extension 4 4  Ankle dorsiflexion 3 3  Ankle plantarflexion 3 4  Ankle inversion    Ankle eversion     (Blank rows = not tested)    FUNCTIONAL TESTS:  Eval TUG time with upright RW 30.75 sec Balance test:  MCTSIB  Feet apart eyes open 30 sec with supervision  Feet apart eyes closed 10 sec with CGA  Feet apart of foam eyes open 10 seconds  Feet apart on foam eyes closed 7 seconds  =57/120  Feet together eyes open 7 seconds     TREATMENT:                                                                                                                              DATE:  12/26/22 Eval and performance testing, see above for detalis HEP creation and review with demo and education provided   PATIENT EDUCATION:  Education details:  PT plan of care, HEP Person educated: Patient and sister Education method: Explanation, Demonstration, Verbal cues, and Handouts Education comprehension: verbalized understanding and needs further education  HOME EXERCISE PROGRAM: Access Code: URL: [email or web address] Date: 12/26/2022 Prepared by: Ivery Quale  Exercises - Seated Long Arc Quad  - 2 x daily - 6 x weekly - 2-3 sets - 10 reps - Seated Straight Leg Raise with Quad Contraction  - 2 x daily - 6 x weekly - 3 sets - 10 reps - Standing Hip Abduction with Counter Support  - 2 x daily - 6 x weekly - 1-2 sets - 10 reps - alternating marching  - 2 x daily - 6 x weekly - 1-2 sets - 10 reps - Heel Toe Raises with Counter Support  - 2 x daily - 6 x weekly - 2 sets - 10 reps - Romberg Stance  - 2 x daily - 6 x weekly - 1 sets - 3 reps - 20-30 sec hold - Wide Stance with Head Rotations and Unilateral Counter Support  - 2 x daily - 6 x weekly - 1 sets - 10-15 reps - Wide Stance with Head Nods and Counter Support  - 2 x daily - 6 x weekly - 1 sets - 10-15  reps - Standing Romberg to 1/2 Tandem Stance  - 2 x daily - 6 x weekly - 3 sets - 3 reps - 10 sec hold   ASSESSMENT:  CLINICAL IMPRESSION: Patient is a 72 y.o. female who was seen today for physical therapy evaluation and treatment for recurrent falls - patient with chronic peripheral neuropathy and deconditioning. She will benefit from skilled PT to address the following impairments listed below   OBJECTIVE IMPAIRMENTS: Abnormal gait, cardiopulmonary status limiting activity, decreased activity tolerance, decreased balance, decreased coordination, decreased endurance, decreased mobility, difficulty walking, decreased ROM, decreased strength, and pain.   ACTIVITY LIMITATIONS: carrying, lifting, bending, sitting, standing, squatting, sleeping, stairs, transfers, bed mobility, bathing, toileting, hygiene/grooming, and locomotion level  PARTICIPATION LIMITATIONS: meal prep, cleaning, laundry, shopping, and community activity  PERSONAL FACTORS: Fitness, Past/current experiences, and 3+ comorbidities:    are also affecting patient's functional outcome.   REHAB POTENTIAL: Fair    CLINICAL DECISION MAKING: Evolving/moderate complexity  EVALUATION COMPLEXITY: Moderate   GOALS: Goals reviewed with patient? Yes Short term PT Goals Target date: 01/23/2023   Pt will be I and compliant with HEP. Baseline:  Goal status: New   Long term PT goals Target date:02/20/2023   Pt will improve MCTSIB balance test score to at least 80/120 to show reduced risk for falling Baseline: Goal status: New Pt will improve  hip/knee strength to at least 4/5 MMT to improve functional strength Baseline: Goal status: New Pt will improve FOTO to at least 44% functional to show improved function Baseline: Goal status: New Pt report no new falls since starting PT. Goal status: NEW   PLAN:  PT FREQUENCY: 1-2x/week  PT DURATION: 8 weeks  PLANNED INTERVENTIONS: 97164- PT Re-evaluation,  97110-Therapeutic exercises, 97530- Therapeutic activity, [id number]- Neuromuscular re-education, 97535- Self Care, 16109- Manual therapy, and 97116- Gait training.  PLAN FOR NEXT SESSION: review and update HEP PRN, needs balance work, endurance, and standing tolerance but careful not to aggravate her back pain too much, no electric modalities due to having spinal stimulator   April Manson, PT,DPT 12/26/2022, 1:57 PM

## 2022-12-31 ENCOUNTER — Other Ambulatory Visit: Payer: Self-pay | Admitting: Pharmacist

## 2022-12-31 NOTE — Progress Notes (Addendum)
Called and spoke to Lori Orozco today to confirm plan for MAP renewal for 2025.  Has been communicating with clinic via [employer].   Discussed that although income has not changed from last year, we need to write an estimate on the application. We also discussed need for signature of the renewal application.   Patient states that her monthly income includes only social security check. $1610.96 / month. Lives in household of 1.  Patient prefers to sign application via mail as transportation is limited. She is approved currently through end of year so there should be no rush. Discussed that she can fax completed document to clinic, have someone else drop it off at clinic, or can mail it herself to the company.   Provided patient with personal direct office line in the event that she has any further questions.  Application has been completed and faxed to clinic for Md signature with instruction to mail to patient's home address for signature.   Loree Fee, PharmD Clinical Pharmacist [facility] [phone]

## 2023-01-02 ENCOUNTER — Telehealth: Payer: Self-pay | Admitting: *Deleted

## 2023-01-02 NOTE — Telephone Encounter (Signed)
Patient contacted the office requesting help with completing her patient assistance application. Please reach out to patient.

## 2023-01-03 ENCOUNTER — Telehealth: Payer: Self-pay | Admitting: Internal Medicine

## 2023-01-03 NOTE — Telephone Encounter (Signed)
Patient advised to complete to the best of her ability and we will fill in what needs to be completed with insurance. Left VM with patient advising of this  Chesley Mires, PharmD, MPH, BCPS, CPP Clinical Pharmacist (Rheumatology and Pulmonology)

## 2023-01-03 NOTE — Telephone Encounter (Signed)
Patient called requesting a return call regarding her patient assistance application.  Patient states she doesn't have group numbers for her Medicare and secondary insurance.  She doesn't know how to answer the start & end dates for insurance and pharmacy.

## 2023-01-03 NOTE — Telephone Encounter (Signed)
Returned call to patient regarding [place] PAP renewal. Unable to reach. Left VM requesting return call with details  Chesley Mires, PharmD, MPH, BCPS, CPP Clinical Pharmacist (Rheumatology and Pulmonology)

## 2023-01-05 ENCOUNTER — Encounter: Payer: Self-pay | Admitting: Family Medicine

## 2023-01-07 ENCOUNTER — Encounter: Payer: Self-pay | Admitting: Family Medicine

## 2023-01-07 ENCOUNTER — Encounter: Payer: Self-pay | Admitting: Physical Therapy

## 2023-01-07 ENCOUNTER — Ambulatory Visit (INDEPENDENT_AMBULATORY_CARE_PROVIDER_SITE_OTHER): Payer: Medicare Other | Admitting: Physical Therapy

## 2023-01-07 DIAGNOSIS — M6281 Muscle weakness (generalized): Secondary | ICD-10-CM

## 2023-01-07 DIAGNOSIS — M5459 Other low back pain: Secondary | ICD-10-CM

## 2023-01-07 DIAGNOSIS — R296 Repeated falls: Secondary | ICD-10-CM

## 2023-01-07 DIAGNOSIS — R2689 Other abnormalities of gait and mobility: Secondary | ICD-10-CM

## 2023-01-07 NOTE — Therapy (Signed)
OUTPATIENT PHYSICAL THERAPY TREATMENT    Patient Name: Lori Orozco MRN: [id number] DOB:1950-10-28, 72 y.o., female Today's Date: 01/07/2023  END OF SESSION:  PT End of Session - 01/07/23 1151     Visit Number 2    Number of Visits 15    Date for PT Re-Evaluation 02/20/23    Authorization Type MCR    Progress Note Due on Visit 10    PT Start Time 1145    PT Stop Time 1223    PT Time Calculation (min) 38 min    Activity Tolerance Patient tolerated treatment well             Past Medical History:  Diagnosis Date   Allergy Hayfever   1958   Amaurosis fugax    Anemia    hx   Anxiety    Arthritis    Asthma    Cataract    Chronic fatigue syndrome    Chronic kidney disease    frequency, nephritis when 72 yrs old   COPD (chronic obstructive pulmonary disease) (HCC)    COVID-19    Depression    Diverticulitis    Emphysema of lung (HCC)    Fibromyalgia    GERD (gastroesophageal reflux disease)    occ   H/O hiatal hernia    History of bronchitis    Hyperlipidemia    Hypothyroidism    Interstitial cystitis    Irritable bowel syndrome    Lichen sclerosus    Lumbar herniated disc    Migraines    Motor nerve conduction block 11/2021   Osteoporosis 09/10/2022   Pneumonia    PONV (postoperative nausea and vomiting)    Shortness of breath    exertion    Thyroid disease    [place]   Urinary frequency    Urinary tract infection    Vertigo    Past Surgical History:  Procedure Laterality Date   ABDOMINAL HYSTERECTOMY     bladder sugery      CAROTID DOPPLAR     COLONOSCOPY  05/26/2010   avms- otherwise nl , re check 10y   DEXA-OSTEOPENIA     DOPPLER ECHOCARDIOGRAPHY     elbow surgery     EPICONDYLITIS     EYE SURGERY Bilateral    cataracts   FOOT SURGERY Bilateral    I & D EXTREMITY Right 09/01/2020   Procedure: TYNOSYNOVECTOMY;  Surgeon: [name], MD;  Location: [facility] OR;  Service: Orthopedics;  Laterality: Right;   IMPLANTATION VAGAL  NERVE STIMULATOR  03/2022   JOINT REPLACEMENT     KNEE SURGERY     Left cartilage   lipoma in second finger right hand     MOUTH SURGERY  08/22/2022   PLANTAR FASCIA SURGERY Left    REVERSE SHOULDER ARTHROPLASTY Left 02/02/2020   Procedure: LEFT REVERSE SHOULDER ARTHROPLASTY;  Surgeon: [name], MD;  Location: [facility] OR;  Service: Orthopedics;  Laterality: Left;   ROTATOR CUFF REPAIR Left 12/2017   TEMPOROMANDIBULAR JOINT SURGERY     TONSILLECTOMY     TOTAL HIP ARTHROPLASTY Right 03/20/2013   Procedure: Right TOTAL HIP ARTHROPLASTY;  Surgeon: [name], MD;  Location: [facility] OR;  Service: Orthopedics;  Laterality: Right;  Right Total Hip Arthroplasty   TOTAL KNEE ARTHROPLASTY Left 01/28/2015   Procedure: LEFT TOTAL KNEE ARTHROPLASTY;  Surgeon: [name], MD;  Location: [facility];  Service: Orthopedics;  Laterality: Left;   TRIGGER FINGER RELEASE Right    TROCHANTERIC BURSA EXCISION  Right 05/03/2016   Dr. Doneen Poisson   WRIST ARTHROSCOPY Right    ligament tear   Patient Active Problem List   Diagnosis Date Noted   Recurrent falls 12/24/2022   Pre-operative clearance 10/30/2022   Diarrhea 10/19/2022   Cold intolerance 10/19/2022   Osteoporosis 09/10/2022   Laceration of forehead 08/29/2022   Thoracic compression fracture (HCC) 07/13/2022   Diabetes mellitus screening 07/13/2022   Lumbar herniated disc    Chronic pain 11/28/2021   Bilateral leg weakness 10/09/2021   Vitamin B12 deficiency 08/02/2021   Myofascial pain dysfunction syndrome 08/02/2021   Adverse effect of prednisone 07/11/2021   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   History of pulmonary embolism 12/08/2020   Arthritis of knee 12/06/2020   Bilateral knee pain 09/20/2020   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   High risk medication use 09/20/2020   Extensor tenosynovitis of right wrist 09/15/2020   S/P reverse total shoulder arthroplasty, 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   Lung  nodule 01/02/2020   Status post arthroscopy of left shoulder 01/16/2018   Estrogen deficiency 06/28/2017   Medial epicondylitis, left elbow 04/29/2017   S/P arthroscopy of left shoulder 02/25/2017   Impingement syndrome of left shoulder 01/30/2017   Trochanteric bursitis, right hip 04/10/2016   Hypertension 09/02/2015   Urge incontinence 07/27/2015   Obesity (BMI 30-39.9) 04/28/2015   Osteoarthritis of left knee 01/28/2015   Status post total left knee replacement 01/28/2015   Vitamin D deficiency 04/27/2014   Seasonal and perennial allergic rhinitis 06/17/2013   S/P total hip arthroplasty 03/20/2013   Medicare annual wellness visit, subsequent 11/18/2012   History of falling 11/18/2012   Routine general medical examination at a health care facility 11/10/2012   Lichen sclerosus et atrophicus of the vulva 07/29/2012   Osteopenia 10/19/2011   Hypothyroidism 10/19/2011   Hereditary and idiopathic peripheral neuropathy 08/05/2009   Low back pain 08/05/2009   HAND PAIN, BILATERAL 08/05/2009   TREMOR 03/09/2008   MENOPAUSAL SYNDROME 10/09/2007   DEPRESSION, MAJOR 05/20/2007   BIPOLAR AFFECTIVE DISORDER 05/20/2007   Generalized anxiety disorder 05/20/2007   PERSONALITY DISORDER 05/20/2007   ESOPHAGEAL SPASM 05/20/2007   Diaphragmatic hernia 05/20/2007   COPD mixed type (HCC) 03/27/2007   HYPERCHOLESTEROLEMIA, PURE 12/10/2006   SYMPTOM, SYNDROME, CHRONIC FATIGUE 12/10/2006    PCP: [name], MD   REFERRING PROVIDER: Fuller Plan, MD   REFERRING DIAG: R29.6 (ICD-10-CM) - Recurrent falls   Rationale for Evaluation and Treatment: Rehabilitation  THERAPY DIAG:  Other abnormalities of gait and mobility  Repeated falls  Muscle weakness (generalized)  Other low back pain  ONSET DATE: 12/17/22, most recent fall   SUBJECTIVE:  SUBJECTIVE STATEMENT: Relays the pain is bad started not to come, but her sister made her come.  PERTINENT HISTORY:  PMH includes: amaurosis Fugax, anxiety, chronic fatigue syndrome, CKD, COPD, COVID, Emphysema, Fibromyalgia, IBS, Linchen sclerosus, Migraines, vertigo, dexa - osteopenia, s/p bil. TKA, s/p Lt TSA, spinal stimulator  PAIN:  Are you having pain? Yes: NPRS scale: 9/10 Pain location: low back Pain description: constant sharp pain Aggravating factors: sitting in one position, standing too long,  Relieving factors: stimulator  PRECAUTIONS:  Fall  RED FLAGS: None   WEIGHT BEARING RESTRICTIONS:  No  FALLS:  Has patient fallen in last 6 months? Yes. Number of falls 4  LIVING ENVIRONMENT:  Stairs: yes, has chair lift she prefers to use Lives with sister  OCCUPATION:  none  PLOF:  Needs RW for ambulation, can dress herself, has to sponge bath, can not stand long enough for cooking or housework.   PATIENT GOALS: reduce falls, strength legs, improve standing tolernace   NEXT MD VISIT: 04/01/23   OBJECTIVE:  Note: Objective measures were completed at Evaluation unless otherwise noted.  PATIENT SURVEYS:  Eval: FOTO 38% functional, goal is 44%  COGNITIVE STATUS: Within functional limits for tasks assessed    GAIT: Eval Distance walked: 20 feet Assistive device utilized: Walker - 4 wheeled, upright Level of assistance: SBA Comments: decreased walking tolerance due to pain and weakness   LOWER EXTREMITY MMT:    MMT tested in sitting Right eval Left eval  Hip flexion 3 3  Hip extension    Hip abduction 3 3  Hip adduction    Hip internal rotation 3 3  Hip external rotation 3 3  Knee flexion 4 4  Knee extension 4 4  Ankle dorsiflexion 3 3  Ankle plantarflexion 3 4  Ankle inversion    Ankle eversion     (Blank rows = not tested)    FUNCTIONAL TESTS:  Eval TUG time with upright RW 30.75  sec Balance test:  MCTSIB  Feet apart eyes open 30 sec with supervision  Feet apart eyes closed 10 sec with CGA  Feet apart of foam eyes open 10 seconds  Feet apart on foam eyes closed 7 seconds  =57/120  Feet together eyes open 7 seconds     TREATMENT:                                                                                                                              DATE:   01/07/23 Seated LAQ 2# 2X10 bilat Seated hamstring curls green 2X10 bilat Seated SLR 2X10 bilat Sit to stand X 5 reps from elevated mat table  Standing hip abduction X 10 bilat Standing march X 10 bilat  Standing balance with feet apart and head turns X 9 reps bilat then needed to sit due to back pain Nu step L5 UE/LE seat #10 for 8 min Ambulation 50 feet X 3 with rollator and supervision, spread out throughout session  12/26/22 Eval and performance testing, see above for detalis HEP creation and review with demo and education provided   PATIENT EDUCATION:  Education details: PT plan of care, HEP Person educated: Patient and sister Education method: Programmer, multimedia, Facilities manager, Verbal cues, and Handouts Education comprehension: verbalized understanding and needs further education  HOME EXERCISE PROGRAM: Access Code: URL: [email or web address] Date: 12/26/2022 Prepared by: Ivery Quale  Exercises - Seated Long Arc Quad  - 2 x daily - 6 x weekly - 2-3 sets - 10 reps - Seated Straight Leg Raise with Quad Contraction  - 2 x daily - 6 x weekly - 3 sets - 10 reps - Standing Hip Abduction with Counter Support  - 2 x daily - 6 x weekly - 1-2 sets - 10 reps - alternating marching  - 2 x daily - 6 x weekly - 1-2 sets - 10 reps - Heel Toe Raises with Counter Support  - 2 x daily - 6 x weekly - 2 sets - 10 reps - Romberg Stance  - 2 x daily - 6 x weekly - 1 sets - 3 reps - 20-30 sec hold - Wide Stance with Head Rotations and Unilateral Counter Support  - 2 x daily - 6 x weekly  - 1 sets - 10-15 reps - Wide Stance with Head Nods and Counter Support  - 2 x daily - 6 x weekly - 1 sets - 10-15 reps - Standing Romberg to 1/2 Tandem Stance  - 2 x daily - 6 x weekly - 3 sets - 3 reps - 10 sec hold   ASSESSMENT:  CLINICAL IMPRESSION: She was limited by overall pain today 9/10 in her back and legs which limited her exercise tolerance in PT particularly with standing activity. We will work to gently progress her as tolerated to improve function with sitting rest breaks as needed.   OBJECTIVE IMPAIRMENTS: Abnormal gait, cardiopulmonary status limiting activity, decreased activity tolerance, decreased balance, decreased coordination, decreased endurance, decreased mobility, difficulty walking, decreased ROM, decreased strength, and pain.   ACTIVITY LIMITATIONS: carrying, lifting, bending, sitting, standing, squatting, sleeping, stairs, transfers, bed mobility, bathing, toileting, hygiene/grooming, and locomotion level  PARTICIPATION LIMITATIONS: meal prep, cleaning, laundry, shopping, and community activity  PERSONAL FACTORS: Fitness, Past/current experiences, and 3+ comorbidities:    are also affecting patient's functional outcome.   REHAB POTENTIAL: Fair    CLINICAL DECISION MAKING: Evolving/moderate complexity  EVALUATION COMPLEXITY: Moderate   GOALS: Goals reviewed with patient? Yes Short term PT Goals Target date: 01/23/2023   Pt will be I and compliant with HEP. Baseline:  Goal status: ongoing 01/07/23   Long term PT goals Target date:02/20/2023   Pt will improve MCTSIB balance test score to at least 80/120 to show reduced risk for falling Baseline: Goal status: ongoing 01/07/23 Pt will improve  hip/knee strength to at least 4/5 MMT to improve functional strength Baseline: Goal status: ongoing 01/07/23 Pt will improve FOTO to at least 44% functional to show improved function Baseline: Goal status: ongoing 01/07/23 Pt report no new falls since  starting PT. Goal status: ongoing 01/07/23   PLAN:  PT FREQUENCY: 1-2x/week  PT DURATION: 8 weeks  PLANNED INTERVENTIONS: 97164- PT Re-evaluation, 97110-Therapeutic exercises, 97530- Therapeutic activity, 97112- Neuromuscular re-education, 97535- Self Care, [id number]- Manual therapy, and 97116- Gait training.  PLAN FOR NEXT SESSION: review and update HEP PRN, needs balance work, endurance, and standing tolerance but careful not to aggravate her back pain too much, no electric modalities due to having spinal stimulator  April Manson, PT,DPT 01/07/2023, 11:52 AM

## 2023-01-09 ENCOUNTER — Telehealth: Payer: Self-pay | Admitting: Pharmacist

## 2023-01-09 NOTE — Telephone Encounter (Signed)
Received Enbrel PAP renewal application from patient. Provider portion placed in Dr. Gregary Cromer folder for signature  Chesley Mires, PharmD, MPH, BCPS, CPP Clinical Pharmacist (Rheumatology and Pulmonology)

## 2023-01-10 ENCOUNTER — Encounter: Payer: Self-pay | Admitting: Physical Therapy

## 2023-01-10 ENCOUNTER — Ambulatory Visit (INDEPENDENT_AMBULATORY_CARE_PROVIDER_SITE_OTHER): Payer: Medicare Other | Admitting: Physical Therapy

## 2023-01-10 DIAGNOSIS — M6281 Muscle weakness (generalized): Secondary | ICD-10-CM | POA: Diagnosis not present

## 2023-01-10 DIAGNOSIS — R296 Repeated falls: Secondary | ICD-10-CM

## 2023-01-10 DIAGNOSIS — M5459 Other low back pain: Secondary | ICD-10-CM | POA: Diagnosis not present

## 2023-01-10 DIAGNOSIS — R2689 Other abnormalities of gait and mobility: Secondary | ICD-10-CM | POA: Diagnosis not present

## 2023-01-10 NOTE — Telephone Encounter (Signed)
Recevied signed MD form from Dr. Dimple Casey. Scanned application to Onbase with med list, insurance card copy, patient portion..  Submitted a Prior Authorization RENEWAL request to [facility] for ENBREL via CoverMyMeds. Will update once we receive a response.  Key: [id number]   Chesley Mires, PharmD, MPH, BCPS, CPP Clinical Pharmacist (Rheumatology and Pulmonology)

## 2023-01-10 NOTE — Telephone Encounter (Signed)
Received notification from [facility] regarding a prior authorization for ENBREL. Authorization has been APPROVED from 03/29/22 to 02/26/23. Approval letter sent to scan center.  Submitted Patient Assistance RENEWAL Application to [employer] for ENBREL along with provider portion, patient portion, PA, medication list, insurance card copy. Will update patient when we receive a response.  Phone #: [phone] Fax #: [phone]

## 2023-01-10 NOTE — Therapy (Signed)
OUTPATIENT PHYSICAL THERAPY TREATMENT    Patient Name: Lori Orozco MRN: [id number] DOB:Feb 16, 1951, 72 y.o., female Today's Date: 01/10/2023  END OF SESSION:  PT End of Session - 01/10/23 1555     Visit Number 3    Number of Visits 15    Date for PT Re-Evaluation 02/20/23    Authorization Type MCR    Progress Note Due on Visit 10    PT Start Time 1512    PT Stop Time 1550    PT Time Calculation (min) 38 min    Activity Tolerance Patient tolerated treatment well             Past Medical History:  Diagnosis Date   Allergy Hayfever   1958   Amaurosis fugax    Anemia    hx   Anxiety    Arthritis    Asthma    Cataract    Chronic fatigue syndrome    Chronic kidney disease    frequency, nephritis when 72 yrs old   COPD (chronic obstructive pulmonary disease) (HCC)    COVID-19    Depression    Diverticulitis    Emphysema of lung (HCC)    Fibromyalgia    GERD (gastroesophageal reflux disease)    occ   H/O hiatal hernia    History of bronchitis    Hyperlipidemia    Hypothyroidism    Interstitial cystitis    Irritable bowel syndrome    Lichen sclerosus    Lumbar herniated disc    Migraines    Motor nerve conduction block 11/2021   Osteoporosis 09/10/2022   Pneumonia    PONV (postoperative nausea and vomiting)    Shortness of breath    exertion    Thyroid disease    [place]   Urinary frequency    Urinary tract infection    Vertigo    Past Surgical History:  Procedure Laterality Date   ABDOMINAL HYSTERECTOMY     bladder sugery      CAROTID DOPPLAR     COLONOSCOPY  05/26/2010   avms- otherwise nl , re check 10y   DEXA-OSTEOPENIA     DOPPLER ECHOCARDIOGRAPHY     elbow surgery     EPICONDYLITIS     EYE SURGERY Bilateral    cataracts   FOOT SURGERY Bilateral    I & D EXTREMITY Right 09/01/2020   Procedure: TYNOSYNOVECTOMY;  Surgeon: [name], MD;  Location: [facility] OR;  Service: Orthopedics;  Laterality: Right;   IMPLANTATION VAGAL  NERVE STIMULATOR  03/2022   JOINT REPLACEMENT     KNEE SURGERY     Left cartilage   lipoma in second finger right hand     MOUTH SURGERY  08/22/2022   PLANTAR FASCIA SURGERY Left    REVERSE SHOULDER ARTHROPLASTY Left 02/02/2020   Procedure: LEFT REVERSE SHOULDER ARTHROPLASTY;  Surgeon: [name], MD;  Location: [facility];  Service: Orthopedics;  Laterality: Left;   ROTATOR CUFF REPAIR Left 12/2017   TEMPOROMANDIBULAR JOINT SURGERY     TONSILLECTOMY     TOTAL HIP ARTHROPLASTY Right 03/20/2013   Procedure: Right TOTAL HIP ARTHROPLASTY;  Surgeon: [name], MD;  Location: [facility] OR;  Service: Orthopedics;  Laterality: Right;  Right Total Hip Arthroplasty   TOTAL KNEE ARTHROPLASTY Left 01/28/2015   Procedure: LEFT TOTAL KNEE ARTHROPLASTY;  Surgeon: [name], MD;  Location: [facility];  Service: Orthopedics;  Laterality: Left;   TRIGGER FINGER RELEASE Right    TROCHANTERIC BURSA EXCISION  Right 05/03/2016   Dr. Doneen Poisson   WRIST ARTHROSCOPY Right    ligament tear   Patient Active Problem List   Diagnosis Date Noted   Recurrent falls 12/24/2022   Pre-operative clearance 10/30/2022   Diarrhea 10/19/2022   Cold intolerance 10/19/2022   Osteoporosis 09/10/2022   Laceration of forehead 08/29/2022   Thoracic compression fracture (HCC) 07/13/2022   Diabetes mellitus screening 07/13/2022   Lumbar herniated disc    Chronic pain 11/28/2021   Bilateral leg weakness 10/09/2021   Vitamin B12 deficiency 08/02/2021   Myofascial pain dysfunction syndrome 08/02/2021   Adverse effect of prednisone 07/11/2021   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   History of pulmonary embolism 12/08/2020   Arthritis of knee 12/06/2020   Bilateral knee pain 09/20/2020   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   High risk medication use 09/20/2020   Extensor tenosynovitis of right wrist 09/15/2020   S/P reverse total shoulder arthroplasty, 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   Lung  nodule 01/02/2020   Status post arthroscopy of left shoulder 01/16/2018   Estrogen deficiency 06/28/2017   Medial epicondylitis, left elbow 04/29/2017   S/P arthroscopy of left shoulder 02/25/2017   Impingement syndrome of left shoulder 01/30/2017   Trochanteric bursitis, right hip 04/10/2016   Hypertension 09/02/2015   Urge incontinence 07/27/2015   Obesity (BMI 30-39.9) 04/28/2015   Osteoarthritis of left knee 01/28/2015   Status post total left knee replacement 01/28/2015   Vitamin D deficiency 04/27/2014   Seasonal and perennial allergic rhinitis 06/17/2013   S/P total hip arthroplasty 03/20/2013   Medicare annual wellness visit, subsequent 11/18/2012   History of falling 11/18/2012   Routine general medical examination at a health care facility 11/10/2012   Lichen sclerosus et atrophicus of the vulva 07/29/2012   Osteopenia 10/19/2011   Hypothyroidism 10/19/2011   Hereditary and idiopathic peripheral neuropathy 08/05/2009   Low back pain 08/05/2009   HAND PAIN, BILATERAL 08/05/2009   TREMOR 03/09/2008   MENOPAUSAL SYNDROME 10/09/2007   DEPRESSION, MAJOR 05/20/2007   BIPOLAR AFFECTIVE DISORDER 05/20/2007   Generalized anxiety disorder 05/20/2007   PERSONALITY DISORDER 05/20/2007   ESOPHAGEAL SPASM 05/20/2007   Diaphragmatic hernia 05/20/2007   COPD mixed type (HCC) 03/27/2007   HYPERCHOLESTEROLEMIA, PURE 12/10/2006   SYMPTOM, SYNDROME, CHRONIC FATIGUE 12/10/2006    PCP: [name], MD   REFERRING PROVIDER: Fuller Plan, MD   REFERRING DIAG: R29.6 (ICD-10-CM) - Recurrent falls   Rationale for Evaluation and Treatment: Rehabilitation  THERAPY DIAG:  Other abnormalities of gait and mobility  Repeated falls  Muscle weakness (generalized)  Other low back pain  ONSET DATE: 12/17/22, most recent fall   SUBJECTIVE:  SUBJECTIVE STATEMENT: Relays the pain continues to be really bad  PERTINENT HISTORY:  PMH includes: amaurosis Fugax, anxiety, chronic fatigue syndrome, CKD, COPD, COVID, Emphysema, Fibromyalgia, IBS, Linchen sclerosus, Migraines, vertigo, dexa - osteopenia, s/p bil. TKA, s/p Lt TSA, spinal stimulator  PAIN:  Are you having pain? Yes: NPRS scale: 9/10 Pain location: low back Pain description: constant sharp pain Aggravating factors: sitting in one position, standing too long,  Relieving factors: stimulator  PRECAUTIONS:  Fall  RED FLAGS: None   WEIGHT BEARING RESTRICTIONS:  No  FALLS:  Has patient fallen in last 6 months? Yes. Number of falls 4  LIVING ENVIRONMENT:  Stairs: yes, has chair lift she prefers to use Lives with sister  OCCUPATION:  none  PLOF:  Needs RW for ambulation, can dress herself, has to sponge bath, can not stand long enough for cooking or housework.   PATIENT GOALS: reduce falls, strength legs, improve standing tolernace   NEXT MD VISIT: 04/01/23   OBJECTIVE:  Note: Objective measures were completed at Evaluation unless otherwise noted.  PATIENT SURVEYS:  Eval: FOTO 38% functional, goal is 44%  COGNITIVE STATUS: Within functional limits for tasks assessed    GAIT: Eval Distance walked: 20 feet Assistive device utilized: Walker - 4 wheeled, upright Level of assistance: SBA Comments: decreased walking tolerance due to pain and weakness   LOWER EXTREMITY MMT:    MMT tested in sitting Right eval Left eval  Hip flexion 3 3  Hip extension    Hip abduction 3 3  Hip adduction    Hip internal rotation 3 3  Hip external rotation 3 3  Knee flexion 4 4  Knee extension 4 4  Ankle dorsiflexion 3 3  Ankle plantarflexion 3 4  Ankle inversion    Ankle eversion     (Blank rows = not tested)    FUNCTIONAL TESTS:  Eval TUG time with upright RW 30.75 sec Balance test:  MCTSIB  Feet apart  eyes open 30 sec with supervision  Feet apart eyes closed 10 sec with CGA  Feet apart of foam eyes open 10 seconds  Feet apart on foam eyes closed 7 seconds  =57/120  Feet together eyes open 7 seconds     TREATMENT:                                                                                                                              DATE:   01/10/23 Nu step L5 UE/LE seat #10 for 12 min with heat to low back. Ambulation 50 feet X 3 with rollator and supervision, spread out throughout session Seated LAQ 2# 2X10 bilat Seated hamstring curls green 2X10 bilat Seated SLR 2X10 bilat Sit to stand X 5 reps from chair with airex pad  Standing hip abduction X 10 bilat   01/07/23 Seated LAQ 2# 2X10 bilat Seated hamstring curls green 2X10 bilat Seated SLR 2X10 bilat Sit to stand X 5 reps from elevated mat  table  Standing hip abduction X 10 bilat Standing march X 10 bilat  Standing balance with feet apart and head turns X 9 reps bilat then needed to sit due to back pain Nu step L5 UE/LE seat #10 for 8 min Ambulation 50 feet X 3 with rollator and supervision, spread out throughout session  12/26/22 Eval and performance testing, see above for detalis HEP creation and review with demo and education provided   PATIENT EDUCATION:  Education details: PT plan of care, HEP Person educated: Patient and sister Education method: Programmer, multimedia, Facilities manager, Verbal cues, and Handouts Education comprehension: verbalized understanding and needs further education  HOME EXERCISE PROGRAM: Access Code: URL: [email or web address] Date: 12/26/2022 Prepared by: Ivery Quale  Exercises - Seated Long Arc Quad  - 2 x daily - 6 x weekly - 2-3 sets - 10 reps - Seated Straight Leg Raise with Quad Contraction  - 2 x daily - 6 x weekly - 3 sets - 10 reps - Standing Hip Abduction with Counter Support  - 2 x daily - 6 x weekly - 1-2 sets - 10 reps - alternating marching  - 2 x  daily - 6 x weekly - 1-2 sets - 10 reps - Heel Toe Raises with Counter Support  - 2 x daily - 6 x weekly - 2 sets - 10 reps - Romberg Stance  - 2 x daily - 6 x weekly - 1 sets - 3 reps - 20-30 sec hold - Wide Stance with Head Rotations and Unilateral Counter Support  - 2 x daily - 6 x weekly - 1 sets - 10-15 reps - Wide Stance with Head Nods and Counter Support  - 2 x daily - 6 x weekly - 1 sets - 10-15 reps - Standing Romberg to 1/2 Tandem Stance  - 2 x daily - 6 x weekly - 3 sets - 3 reps - 10 sec hold   ASSESSMENT:  CLINICAL IMPRESSION: She continues to be limited with standing activity due to back pain. She did show improvement with sit to stand ability today. PT will continue to try to slowly progress her as tolerated to improve function with sitting rest breaks as needed.   OBJECTIVE IMPAIRMENTS: Abnormal gait, cardiopulmonary status limiting activity, decreased activity tolerance, decreased balance, decreased coordination, decreased endurance, decreased mobility, difficulty walking, decreased ROM, decreased strength, and pain.   ACTIVITY LIMITATIONS: carrying, lifting, bending, sitting, standing, squatting, sleeping, stairs, transfers, bed mobility, bathing, toileting, hygiene/grooming, and locomotion level  PARTICIPATION LIMITATIONS: meal prep, cleaning, laundry, shopping, and community activity  PERSONAL FACTORS: Fitness, Past/current experiences, and 3+ comorbidities:    are also affecting patient's functional outcome.   REHAB POTENTIAL: Fair    CLINICAL DECISION MAKING: Evolving/moderate complexity  EVALUATION COMPLEXITY: Moderate   GOALS: Goals reviewed with patient? Yes Short term PT Goals Target date: 01/23/2023   Pt will be I and compliant with HEP. Baseline:  Goal status: ongoing 01/07/23   Long term PT goals Target date:02/20/2023   Pt will improve MCTSIB balance test score to at least 80/120 to show reduced risk for falling Baseline: Goal status: ongoing  01/07/23 Pt will improve  hip/knee strength to at least 4/5 MMT to improve functional strength Baseline: Goal status: ongoing 01/07/23 Pt will improve FOTO to at least 44% functional to show improved function Baseline: Goal status: ongoing 01/07/23 Pt report no new falls since starting PT. Goal status: ongoing 01/07/23   PLAN:  PT FREQUENCY: 1-2x/week  PT DURATION: 8 weeks  PLANNED  INTERVENTIONS: 97164- PT Re-evaluation, 97110-Therapeutic exercises, 97530- Therapeutic activity, [id number]- Neuromuscular re-education, 97535- Self Care, [id number]- Manual therapy, and 97116- Gait training.  PLAN FOR NEXT SESSION:  needs balance work, endurance, and standing tolerance but careful not to aggravate her back pain too much, no electric modalities due to having spinal stimulator   April Manson, PT,DPT 01/10/2023, 3:56 PM

## 2023-01-11 ENCOUNTER — Encounter: Payer: Self-pay | Admitting: Pharmacist

## 2023-01-11 NOTE — Progress Notes (Signed)
Returned patient's call regarding MAP questions. Have been assisting her with renewal in the [employer] program for her Breztri inhaler.   She confirms receipt of completed application sent from clinic by mail. She signed the application during our phone call. She plans to either scan/email the form or will mail it back to PCP office.   No further action needed at this time. Patient has my direct office line for question/concerns.   Loree Fee, PharmD Clinical Pharmacist [facility] [phone]

## 2023-01-14 ENCOUNTER — Encounter: Payer: Self-pay | Admitting: Physical Therapy

## 2023-01-14 ENCOUNTER — Ambulatory Visit (INDEPENDENT_AMBULATORY_CARE_PROVIDER_SITE_OTHER): Payer: Medicare Other | Admitting: Physical Therapy

## 2023-01-14 DIAGNOSIS — M5459 Other low back pain: Secondary | ICD-10-CM

## 2023-01-14 DIAGNOSIS — R2689 Other abnormalities of gait and mobility: Secondary | ICD-10-CM

## 2023-01-14 DIAGNOSIS — R296 Repeated falls: Secondary | ICD-10-CM | POA: Diagnosis not present

## 2023-01-14 DIAGNOSIS — M6281 Muscle weakness (generalized): Secondary | ICD-10-CM | POA: Diagnosis not present

## 2023-01-14 NOTE — Therapy (Signed)
OUTPATIENT PHYSICAL THERAPY TREATMENT    Patient Name: Lori Orozco MRN: [id number] DOB:06-Mar-1950, 72 y.o., female Today's Date: 01/14/2023  END OF SESSION:  PT End of Session - 01/14/23 1356     Visit Number 4    Number of Visits 15    Date for PT Re-Evaluation 02/20/23    Authorization Type MCR    Progress Note Due on Visit 10    PT Start Time 1340    PT Stop Time 1418    PT Time Calculation (min) 38 min    Activity Tolerance Patient tolerated treatment well             Past Medical History:  Diagnosis Date   Allergy Hayfever   1958   Amaurosis fugax    Anemia    hx   Anxiety    Arthritis    Asthma    Cataract    Chronic fatigue syndrome    Chronic kidney disease    frequency, nephritis when 72 yrs old   COPD (chronic obstructive pulmonary disease) (HCC)    COVID-19    Depression    Diverticulitis    Emphysema of lung (HCC)    Fibromyalgia    GERD (gastroesophageal reflux disease)    occ   H/O hiatal hernia    History of bronchitis    Hyperlipidemia    Hypothyroidism    Interstitial cystitis    Irritable bowel syndrome    Lichen sclerosus    Lumbar herniated disc    Migraines    Motor nerve conduction block 11/2021   Osteoporosis 09/10/2022   Pneumonia    PONV (postoperative nausea and vomiting)    Shortness of breath    exertion    Thyroid disease    [place]   Urinary frequency    Urinary tract infection    Vertigo    Past Surgical History:  Procedure Laterality Date   ABDOMINAL HYSTERECTOMY     bladder sugery      CAROTID DOPPLAR     COLONOSCOPY  05/26/2010   avms- otherwise nl , re check 10y   DEXA-OSTEOPENIA     DOPPLER ECHOCARDIOGRAPHY     elbow surgery     EPICONDYLITIS     EYE SURGERY Bilateral    cataracts   FOOT SURGERY Bilateral    I & D EXTREMITY Right 09/01/2020   Procedure: TYNOSYNOVECTOMY;  Surgeon: [name], MD;  Location: [facility] OR;  Service: Orthopedics;  Laterality: Right;   IMPLANTATION VAGAL  NERVE STIMULATOR  03/2022   JOINT REPLACEMENT     KNEE SURGERY     Left cartilage   lipoma in second finger right hand     MOUTH SURGERY  08/22/2022   PLANTAR FASCIA SURGERY Left    REVERSE SHOULDER ARTHROPLASTY Left 02/02/2020   Procedure: LEFT REVERSE SHOULDER ARTHROPLASTY;  Surgeon: [name], MD;  Location: [facility] OR;  Service: Orthopedics;  Laterality: Left;   ROTATOR CUFF REPAIR Left 12/2017   TEMPOROMANDIBULAR JOINT SURGERY     TONSILLECTOMY     TOTAL HIP ARTHROPLASTY Right 03/20/2013   Procedure: Right TOTAL HIP ARTHROPLASTY;  Surgeon: [name], MD;  Location: [facility] OR;  Service: Orthopedics;  Laterality: Right;  Right Total Hip Arthroplasty   TOTAL KNEE ARTHROPLASTY Left 01/28/2015   Procedure: LEFT TOTAL KNEE ARTHROPLASTY;  Surgeon: [name], MD;  Location: [facility];  Service: Orthopedics;  Laterality: Left;   TRIGGER FINGER RELEASE Right    TROCHANTERIC BURSA EXCISION  Right 05/03/2016   Dr. Doneen Poisson   WRIST ARTHROSCOPY Right    ligament tear   Patient Active Problem List   Diagnosis Date Noted   Recurrent falls 12/24/2022   Pre-operative clearance 10/30/2022   Diarrhea 10/19/2022   Cold intolerance 10/19/2022   Osteoporosis 09/10/2022   Laceration of forehead 08/29/2022   Thoracic compression fracture (HCC) 07/13/2022   Diabetes mellitus screening 07/13/2022   Lumbar herniated disc    Chronic pain 11/28/2021   Bilateral leg weakness 10/09/2021   Vitamin B12 deficiency 08/02/2021   Myofascial pain dysfunction syndrome 08/02/2021   Adverse effect of prednisone 07/11/2021   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   History of pulmonary embolism 12/08/2020   Arthritis of knee 12/06/2020   Bilateral knee pain 09/20/2020   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   High risk medication use 09/20/2020   Extensor tenosynovitis of right wrist 09/15/2020   S/P reverse total shoulder arthroplasty, 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   Lung  nodule 01/02/2020   Status post arthroscopy of left shoulder 01/16/2018   Estrogen deficiency 06/28/2017   Medial epicondylitis, left elbow 04/29/2017   S/P arthroscopy of left shoulder 02/25/2017   Impingement syndrome of left shoulder 01/30/2017   Trochanteric bursitis, right hip 04/10/2016   Hypertension 09/02/2015   Urge incontinence 07/27/2015   Obesity (BMI 30-39.9) 04/28/2015   Osteoarthritis of left knee 01/28/2015   Status post total left knee replacement 01/28/2015   Vitamin D deficiency 04/27/2014   Seasonal and perennial allergic rhinitis 06/17/2013   S/P total hip arthroplasty 03/20/2013   Medicare annual wellness visit, subsequent 11/18/2012   History of falling 11/18/2012   Routine general medical examination at a health care facility 11/10/2012   Lichen sclerosus et atrophicus of the vulva 07/29/2012   Osteopenia 10/19/2011   Hypothyroidism 10/19/2011   Hereditary and idiopathic peripheral neuropathy 08/05/2009   Low back pain 08/05/2009   HAND PAIN, BILATERAL 08/05/2009   TREMOR 03/09/2008   MENOPAUSAL SYNDROME 10/09/2007   DEPRESSION, MAJOR 05/20/2007   BIPOLAR AFFECTIVE DISORDER 05/20/2007   Generalized anxiety disorder 05/20/2007   PERSONALITY DISORDER 05/20/2007   ESOPHAGEAL SPASM 05/20/2007   Diaphragmatic hernia 05/20/2007   COPD mixed type (HCC) 03/27/2007   HYPERCHOLESTEROLEMIA, PURE 12/10/2006   SYMPTOM, SYNDROME, CHRONIC FATIGUE 12/10/2006    PCP: [name], MD   REFERRING PROVIDER: Fuller Plan, MD   REFERRING DIAG: R29.6 (ICD-10-CM) - Recurrent falls   Rationale for Evaluation and Treatment: Rehabilitation  THERAPY DIAG:  Other abnormalities of gait and mobility  Repeated falls  Muscle weakness (generalized)  Other low back pain  ONSET DATE: 12/17/22, most recent fall   SUBJECTIVE:  SUBJECTIVE STATEMENT: Relays the battery went out on her spinal stimulator so can notice a difference with more pain  PERTINENT HISTORY:  PMH includes: amaurosis Fugax, anxiety, chronic fatigue syndrome, CKD, COPD, COVID, Emphysema, Fibromyalgia, IBS, Linchen sclerosus, Migraines, vertigo, dexa - osteopenia, s/p bil. TKA, s/p Lt TSA, spinal stimulator  PAIN:  Are you having pain? Yes: NPRS scale: 9/10 Pain location: low back Pain description: constant sharp pain Aggravating factors: sitting in one position, standing too long,  Relieving factors: stimulator  PRECAUTIONS:  Fall  RED FLAGS: None   WEIGHT BEARING RESTRICTIONS:  No  FALLS:  Has patient fallen in last 6 months? Yes. Number of falls 4  LIVING ENVIRONMENT:  Stairs: yes, has chair lift she prefers to use Lives with sister  OCCUPATION:  none  PLOF:  Needs RW for ambulation, can dress herself, has to sponge bath, can not stand long enough for cooking or housework.   PATIENT GOALS: reduce falls, strength legs, improve standing tolernace   NEXT MD VISIT: 04/01/23   OBJECTIVE:  Note: Objective measures were completed at Evaluation unless otherwise noted.  PATIENT SURVEYS:  Eval: FOTO 38% functional, goal is 44%  COGNITIVE STATUS: Within functional limits for tasks assessed    GAIT: Eval Distance walked: 20 feet Assistive device utilized: Walker - 4 wheeled, upright Level of assistance: SBA Comments: decreased walking tolerance due to pain and weakness   LOWER EXTREMITY MMT:    MMT tested in sitting Right eval Left eval  Hip flexion 3 3  Hip extension    Hip abduction 3 3  Hip adduction    Hip internal rotation 3 3  Hip external rotation 3 3  Knee flexion 4 4  Knee extension 4 4  Ankle dorsiflexion 3 3  Ankle plantarflexion 3 4  Ankle inversion    Ankle eversion     (Blank rows = not tested)    FUNCTIONAL TESTS:  Eval TUG time with  upright RW 30.75 sec Balance test:  MCTSIB  Feet apart eyes open 30 sec with supervision  Feet apart eyes closed 10 sec with CGA  Feet apart of foam eyes open 10 seconds  Feet apart on foam eyes closed 7 seconds  =57/120  Feet together eyes open 7 seconds     TREATMENT:                                                                                                                              DATE:   01/14/23 Nu step L5 UE/LE seat #10 for 15 min for functional endurance and ROM Ambulation 50 feet X 3 with rollator and supervision, spread out throughout session Seated LAQ 3# 3X10 bilat Standing feet together on airex pad 30 sec X 2 Sit to stand X 5 reps from chair with airex pad  Sidestepping at counter top 2 round trips with UE support  01/10/23 Nu step L5 UE/LE seat #10 for 12 min with heat to  low back. Ambulation 50 feet X 3 with rollator and supervision, spread out throughout session Seated LAQ 2# 2X10 bilat Seated hamstring curls green 2X10 bilat Seated SLR 2X10 bilat Sit to stand X 5 reps from chair with airex pad  Standing hip abduction X 10 bilat   01/07/23 Seated LAQ 2# 2X10 bilat Seated hamstring curls green 2X10 bilat Seated SLR 2X10 bilat Sit to stand X 5 reps from elevated mat table  Standing hip abduction X 10 bilat Standing march X 10 bilat  Standing balance with feet apart and head turns X 9 reps bilat then needed to sit due to back pain Nu step L5 UE/LE seat #10 for 8 min Ambulation 50 feet X 3 with rollator and supervision, spread out throughout session  12/26/22 Eval and performance testing, see above for detalis HEP creation and review with demo and education provided   PATIENT EDUCATION:  Education details: PT plan of care, HEP Person educated: Patient and sister Education method: Programmer, multimedia, Facilities manager, Verbal cues, and Handouts Education comprehension: verbalized understanding and needs further education  HOME EXERCISE PROGRAM: Access  Code: URL: [email or web address] Date: 12/26/2022 Prepared by: Ivery Quale  Exercises - Seated Long Arc Quad  - 2 x daily - 6 x weekly - 2-3 sets - 10 reps - Seated Straight Leg Raise with Quad Contraction  - 2 x daily - 6 x weekly - 3 sets - 10 reps - Standing Hip Abduction with Counter Support  - 2 x daily - 6 x weekly - 1-2 sets - 10 reps - alternating marching  - 2 x daily - 6 x weekly - 1-2 sets - 10 reps - Heel Toe Raises with Counter Support  - 2 x daily - 6 x weekly - 2 sets - 10 reps - Romberg Stance  - 2 x daily - 6 x weekly - 1 sets - 3 reps - 20-30 sec hold - Wide Stance with Head Rotations and Unilateral Counter Support  - 2 x daily - 6 x weekly - 1 sets - 10-15 reps - Wide Stance with Head Nods and Counter Support  - 2 x daily - 6 x weekly - 1 sets - 10-15 reps - Standing Romberg to 1/2 Tandem Stance  - 2 x daily - 6 x weekly - 3 sets - 3 reps - 10 sec hold   ASSESSMENT:  CLINICAL IMPRESSION: She continues to have high amounts of pain, we can not use any electric modalities due to spinal stimulator so we are focusing on trying to improve function for standing activity tolerance and mobility.    OBJECTIVE IMPAIRMENTS: Abnormal gait, cardiopulmonary status limiting activity, decreased activity tolerance, decreased balance, decreased coordination, decreased endurance, decreased mobility, difficulty walking, decreased ROM, decreased strength, and pain.   ACTIVITY LIMITATIONS: carrying, lifting, bending, sitting, standing, squatting, sleeping, stairs, transfers, bed mobility, bathing, toileting, hygiene/grooming, and locomotion level  PARTICIPATION LIMITATIONS: meal prep, cleaning, laundry, shopping, and community activity  PERSONAL FACTORS: Fitness, Past/current experiences, and 3+ comorbidities:    are also affecting patient's functional outcome.   REHAB POTENTIAL: Fair    CLINICAL DECISION MAKING: Evolving/moderate complexity  EVALUATION  COMPLEXITY: Moderate   GOALS: Goals reviewed with patient? Yes Short term PT Goals Target date: 01/23/2023   Pt will be I and compliant with HEP. Baseline:  Goal status: ongoing 01/07/23   Long term PT goals Target date:02/20/2023   Pt will improve MCTSIB balance test score to at least 80/120 to show reduced risk for falling Baseline:  Goal status: ongoing 01/07/23 Pt will improve  hip/knee strength to at least 4/5 MMT to improve functional strength Baseline: Goal status: ongoing 01/07/23 Pt will improve FOTO to at least 44% functional to show improved function Baseline: Goal status: ongoing 01/07/23 Pt report no new falls since starting PT. Goal status: ongoing 01/07/23   PLAN:  PT FREQUENCY: 1-2x/week  PT DURATION: 8 weeks  PLANNED INTERVENTIONS: 97164- PT Re-evaluation, 97110-Therapeutic exercises, 97530- Therapeutic activity, 97112- Neuromuscular re-education, 97535- Self Care, [id number]- Manual therapy, and 97116- Gait training.  PLAN FOR NEXT SESSION:  needs balance work, endurance, and standing tolerance but careful not to aggravate her back pain too much, no electric modalities due to having spinal stimulator   April Manson, PT,DPT 01/14/2023, 2:02 PM

## 2023-01-16 ENCOUNTER — Other Ambulatory Visit: Payer: Self-pay

## 2023-01-16 ENCOUNTER — Ambulatory Visit (INDEPENDENT_AMBULATORY_CARE_PROVIDER_SITE_OTHER): Payer: Medicare Other | Admitting: Physical Therapy

## 2023-01-16 ENCOUNTER — Encounter: Payer: Self-pay | Admitting: Physical Therapy

## 2023-01-16 DIAGNOSIS — M6281 Muscle weakness (generalized): Secondary | ICD-10-CM | POA: Diagnosis not present

## 2023-01-16 DIAGNOSIS — M5459 Other low back pain: Secondary | ICD-10-CM | POA: Diagnosis not present

## 2023-01-16 DIAGNOSIS — R296 Repeated falls: Secondary | ICD-10-CM | POA: Diagnosis not present

## 2023-01-16 DIAGNOSIS — R2689 Other abnormalities of gait and mobility: Secondary | ICD-10-CM | POA: Diagnosis not present

## 2023-01-16 DIAGNOSIS — Z79899 Other long term (current) drug therapy: Secondary | ICD-10-CM

## 2023-01-16 DIAGNOSIS — M0600 Rheumatoid arthritis without rheumatoid factor, unspecified site: Secondary | ICD-10-CM

## 2023-01-16 MED ORDER — ENBREL SURECLICK 50 MG/ML ~~LOC~~ SOAJ: 50.0000 mg | SUBCUTANEOUS | 0 refills | Status: DC

## 2023-01-16 NOTE — Progress Notes (Signed)
MCV slightly elevated at 100.7 probably not significant.  Kidney function is stable estimated GFR 51.  CK of 16 is low.  Definitely not consistent with muscle inflammation.  Probably more related to deconditioning and loss of muscle mass.  Sedimentation rate 25 is slightly above normal.  Okay to continue the current treatment plan.

## 2023-01-16 NOTE — Therapy (Signed)
OUTPATIENT PHYSICAL THERAPY TREATMENT    Patient Name: Lori Orozco MRN: [id number] DOB:13-Dec-1950, 72 y.o., female Today's Date: 01/16/2023  END OF SESSION:  PT End of Session - 01/16/23 1310     Visit Number 5    Number of Visits 15    Date for PT Re-Evaluation 02/20/23    Authorization Type MCR    Progress Note Due on Visit 10    PT Start Time 1300    PT Stop Time 1338    PT Time Calculation (min) 38 min    Activity Tolerance Patient tolerated treatment well              Past Medical History:  Diagnosis Date   Allergy Hayfever   1958   Amaurosis fugax    Anemia    hx   Anxiety    Arthritis    Asthma    Cataract    Chronic fatigue syndrome    Chronic kidney disease    frequency, nephritis when 72 yrs old   COPD (chronic obstructive pulmonary disease) (HCC)    COVID-19    Depression    Diverticulitis    Emphysema of lung (HCC)    Fibromyalgia    GERD (gastroesophageal reflux disease)    occ   H/O hiatal hernia    History of bronchitis    Hyperlipidemia    Hypothyroidism    Interstitial cystitis    Irritable bowel syndrome    Lichen sclerosus    Lumbar herniated disc    Migraines    Motor nerve conduction block 11/2021   Osteoporosis 09/10/2022   Pneumonia    PONV (postoperative nausea and vomiting)    Shortness of breath    exertion    Thyroid disease    Graves   Urinary frequency    Urinary tract infection    Vertigo    Past Surgical History:  Procedure Laterality Date   ABDOMINAL HYSTERECTOMY     bladder sugery      CAROTID DOPPLAR     COLONOSCOPY  05/26/2010   avms- otherwise nl , re check 10y   DEXA-OSTEOPENIA     DOPPLER ECHOCARDIOGRAPHY     elbow surgery     EPICONDYLITIS     EYE SURGERY Bilateral    cataracts   FOOT SURGERY Bilateral    I & D EXTREMITY Right 09/01/2020   Procedure: TYNOSYNOVECTOMY;  Surgeon: [name], MD;  Location: [facility] OR;  Service: Orthopedics;  Laterality: Right;   IMPLANTATION VAGAL  NERVE STIMULATOR  03/2022   JOINT REPLACEMENT     KNEE SURGERY     Left cartilage   lipoma in second finger right hand     MOUTH SURGERY  08/22/2022   PLANTAR FASCIA SURGERY Left    REVERSE SHOULDER ARTHROPLASTY Left 02/02/2020   Procedure: LEFT REVERSE SHOULDER ARTHROPLASTY;  Surgeon: [name], MD;  Location: [facility] OR;  Service: Orthopedics;  Laterality: Left;   ROTATOR CUFF REPAIR Left 12/2017   TEMPOROMANDIBULAR JOINT SURGERY     TONSILLECTOMY     TOTAL HIP ARTHROPLASTY Right 03/20/2013   Procedure: Right TOTAL HIP ARTHROPLASTY;  Surgeon: [name], MD;  Location: [facility] OR;  Service: Orthopedics;  Laterality: Right;  Right Total Hip Arthroplasty   TOTAL KNEE ARTHROPLASTY Left 01/28/2015   Procedure: LEFT TOTAL KNEE ARTHROPLASTY;  Surgeon: [name], MD;  Location: [facility];  Service: Orthopedics;  Laterality: Left;   TRIGGER FINGER RELEASE Right    TROCHANTERIC BURSA  EXCISION Right 05/03/2016   Dr. Doneen Poisson   WRIST ARTHROSCOPY Right    ligament tear   Patient Active Problem List   Diagnosis Date Noted   Recurrent falls 12/24/2022   Pre-operative clearance 10/30/2022   Diarrhea 10/19/2022   Cold intolerance 10/19/2022   Osteoporosis 09/10/2022   Laceration of forehead 08/29/2022   Thoracic compression fracture (HCC) 07/13/2022   Diabetes mellitus screening 07/13/2022   Lumbar herniated disc    Chronic pain 11/28/2021   Bilateral leg weakness 10/09/2021   Vitamin B12 deficiency 08/02/2021   Myofascial pain dysfunction syndrome 08/02/2021   Adverse effect of prednisone 07/11/2021   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   History of pulmonary embolism 12/08/2020   Arthritis of knee 12/06/2020   Bilateral knee pain 09/20/2020   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   High risk medication use 09/20/2020   Extensor tenosynovitis of right wrist 09/15/2020   S/P reverse total shoulder arthroplasty, 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   Lung  nodule 01/02/2020   Status post arthroscopy of left shoulder 01/16/2018   Estrogen deficiency 06/28/2017   Medial epicondylitis, left elbow 04/29/2017   S/P arthroscopy of left shoulder 02/25/2017   Impingement syndrome of left shoulder 01/30/2017   Trochanteric bursitis, right hip 04/10/2016   Hypertension 09/02/2015   Urge incontinence 07/27/2015   Obesity (BMI 30-39.9) 04/28/2015   Osteoarthritis of left knee 01/28/2015   Status post total left knee replacement 01/28/2015   Vitamin D deficiency 04/27/2014   Seasonal and perennial allergic rhinitis 06/17/2013   S/P total hip arthroplasty 03/20/2013   Medicare annual wellness visit, subsequent 11/18/2012   History of falling 11/18/2012   Routine general medical examination at a health care facility 11/10/2012   Lichen sclerosus et atrophicus of the vulva 07/29/2012   Osteopenia 10/19/2011   Hypothyroidism 10/19/2011   Hereditary and idiopathic peripheral neuropathy 08/05/2009   Low back pain 08/05/2009   HAND PAIN, BILATERAL 08/05/2009   TREMOR 03/09/2008   MENOPAUSAL SYNDROME 10/09/2007   DEPRESSION, MAJOR 05/20/2007   BIPOLAR AFFECTIVE DISORDER 05/20/2007   Generalized anxiety disorder 05/20/2007   PERSONALITY DISORDER 05/20/2007   ESOPHAGEAL SPASM 05/20/2007   Diaphragmatic hernia 05/20/2007   COPD mixed type (HCC) 03/27/2007   HYPERCHOLESTEROLEMIA, PURE 12/10/2006   SYMPTOM, SYNDROME, CHRONIC FATIGUE 12/10/2006    PCP: [name], MD   REFERRING PROVIDER: Fuller Plan, MD   REFERRING DIAG: R29.6 (ICD-10-CM) - Recurrent falls   Rationale for Evaluation and Treatment: Rehabilitation  THERAPY DIAG:  Other abnormalities of gait and mobility  Repeated falls  Muscle weakness (generalized)  Other low back pain  ONSET DATE: 12/17/22, most recent fall   SUBJECTIVE:  SUBJECTIVE STATEMENT: Continues to report high pain levels and no improvment  PERTINENT HISTORY:  PMH includes: amaurosis Fugax, anxiety, chronic fatigue syndrome, CKD, COPD, COVID, Emphysema, Fibromyalgia, IBS, Linchen sclerosus, Migraines, vertigo, dexa - osteopenia, s/p bil. TKA, s/p Lt TSA, spinal stimulator  PAIN:  Are you having pain? Yes: NPRS scale: 9/10 Pain location: low back Pain description: constant sharp pain Aggravating factors: sitting in one position, standing too long,  Relieving factors: stimulator  PRECAUTIONS:  Fall  RED FLAGS: None   WEIGHT BEARING RESTRICTIONS:  No  FALLS:  Has patient fallen in last 6 months? Yes. Number of falls 4  LIVING ENVIRONMENT:  Stairs: yes, has chair lift she prefers to use Lives with sister  OCCUPATION:  none  PLOF:  Needs RW for ambulation, can dress herself, has to sponge bath, can not stand long enough for cooking or housework.   PATIENT GOALS: reduce falls, strength legs, improve standing tolernace   NEXT MD VISIT: 04/01/23   OBJECTIVE:  Note: Objective measures were completed at Evaluation unless otherwise noted.  PATIENT SURVEYS:  Eval: FOTO 38% functional, goal is 44%  COGNITIVE STATUS: Within functional limits for tasks assessed    GAIT: Eval Distance walked: 20 feet Assistive device utilized: Walker - 4 wheeled, upright Level of assistance: SBA Comments: decreased walking tolerance due to pain and weakness   LOWER EXTREMITY MMT:    MMT tested in sitting Right eval Left eval Right/Left  Hip flexion 3 3 3+/3+  Hip extension     Hip abduction 3 3 3+/3+  Hip adduction     Hip internal rotation 3 3   Hip external rotation 3 3   Knee flexion 4 4 4/4  Knee extension 4 4 4/4  Ankle dorsiflexion 3 3   Ankle plantarflexion 3 4   Ankle inversion     Ankle eversion      (Blank rows = not tested)    FUNCTIONAL TESTS:  Eval TUG time with  upright RW 30.75 sec Balance test:  MCTSIB  Feet apart eyes open 30 sec with supervision  Feet apart eyes closed 10 sec with CGA  Feet apart of foam eyes open 10 seconds  Feet apart on foam eyes closed 7 seconds  =57/120  Feet together eyes open 7 seconds     TREATMENT:                                                                                                                              DATE:   01/16/23 Nu step L5 UE/LE seat #10 for 15 min for functional endurance and ROM Ambulation 50 feet X 2 as well as 100 feet X 1 max tolerated distance with rollator and supervision, spread out throughout session Seated LAQ 3# 3X10 bilat Standing feet together on airex pad 30 sec X 2 Sit to stand X 5 reps from chair with airex pad  Sidestepping at counter top 2 round trips with UE  support  01/14/23 Nu step L5 UE/LE seat #10 for 15 min for functional endurance and ROM Ambulation 50 feet X 3 with rollator and supervision, spread out throughout session Seated LAQ 3# 3X10 bilat Standing feet together on airex pad 30 sec X 2 Sit to stand X 5 reps from chair spread out throughout session Sidestepping at counter top 2 round trips with UE support  01/10/23 Nu step L5 UE/LE seat #10 for 12 min with heat to low back. Ambulation 50 feet X 3 with rollator and supervision, spread out throughout session Seated LAQ 2# 2X10 bilat Seated hamstring curls green 2X10 bilat Seated SLR 2X10 bilat Sit to stand X 5 reps from chair with airex pad  Standing hip abduction X 10 bilat   01/07/23 Seated LAQ 2# 2X10 bilat Seated hamstring curls green 2X10 bilat Seated SLR 2X10 bilat Sit to stand X 5 reps from elevated mat table  Standing hip abduction X 10 bilat Standing march X 10 bilat  Standing balance with feet apart and head turns X 9 reps bilat then needed to sit due to back pain Nu step L5 UE/LE seat #10 for 8 min Ambulation 50 feet X 3 with rollator and supervision, spread out throughout  session  12/26/22 Eval and performance testing, see above for detalis HEP creation and review with demo and education provided   PATIENT EDUCATION:  Education details: PT plan of care, HEP Person educated: Patient and sister Education method: Programmer, multimedia, Facilities manager, Verbal cues, and Handouts Education comprehension: verbalized understanding and needs further education  HOME EXERCISE PROGRAM: Access Code: URL: [email or web address] Date: 12/26/2022 Prepared by: Ivery Quale  Exercises - Seated Long Arc Quad  - 2 x daily - 6 x weekly - 2-3 sets - 10 reps - Seated Straight Leg Raise with Quad Contraction  - 2 x daily - 6 x weekly - 3 sets - 10 reps - Standing Hip Abduction with Counter Support  - 2 x daily - 6 x weekly - 1-2 sets - 10 reps - alternating marching  - 2 x daily - 6 x weekly - 1-2 sets - 10 reps - Heel Toe Raises with Counter Support  - 2 x daily - 6 x weekly - 2 sets - 10 reps - Romberg Stance  - 2 x daily - 6 x weekly - 1 sets - 3 reps - 20-30 sec hold - Wide Stance with Head Rotations and Unilateral Counter Support  - 2 x daily - 6 x weekly - 1 sets - 10-15 reps - Wide Stance with Head Nods and Counter Support  - 2 x daily - 6 x weekly - 1 sets - 10-15 reps - Standing Romberg to 1/2 Tandem Stance  - 2 x daily - 6 x weekly - 3 sets - 3 reps - 10 sec hold   ASSESSMENT:  CLINICAL IMPRESSION: She did show improvements with updated measurements for leg strength and max tolerated walking distance however her pain has not improved any with PT. We are focusing on trying to improve function for standing activity tolerance, balance and mobility and to reduce risk of falling   OBJECTIVE IMPAIRMENTS: Abnormal gait, cardiopulmonary status limiting activity, decreased activity tolerance, decreased balance, decreased coordination, decreased endurance, decreased mobility, difficulty walking, decreased ROM, decreased strength, and pain.   ACTIVITY  LIMITATIONS: carrying, lifting, bending, sitting, standing, squatting, sleeping, stairs, transfers, bed mobility, bathing, toileting, hygiene/grooming, and locomotion level  PARTICIPATION LIMITATIONS: meal prep, cleaning, laundry, shopping, and community activity  PERSONAL FACTORS:  Fitness, Past/current experiences, and 3+ comorbidities:    are also affecting patient's functional outcome.   REHAB POTENTIAL: Fair    CLINICAL DECISION MAKING: Evolving/moderate complexity  EVALUATION COMPLEXITY: Moderate   GOALS: Goals reviewed with patient? Yes Short term PT Goals Target date: 01/23/2023   Pt will be I and compliant with HEP. Baseline:  Goal status: ongoing 01/07/23   Long term PT goals Target date:02/20/2023   Pt will improve MCTSIB balance test score to at least 80/120 to show reduced risk for falling Baseline: Goal status: ongoing 01/07/23 Pt will improve  hip/knee strength to at least 4/5 MMT to improve functional strength Baseline: Goal status: ongoing 01/07/23 Pt will improve FOTO to at least 44% functional to show improved function Baseline: Goal status: ongoing 01/07/23 Pt report no new falls since starting PT. Goal status: ongoing 01/07/23   PLAN:  PT FREQUENCY: 1-2x/week  PT DURATION: 8 weeks  PLANNED INTERVENTIONS: 97164- PT Re-evaluation, 97110-Therapeutic exercises, 97530- Therapeutic activity, 97112- Neuromuscular re-education, 97535- Self Care, 46962- Manual therapy, and 97116- Gait training.  PLAN FOR NEXT SESSION:  needs balance work, endurance, and standing tolerance but careful not to aggravate her back pain too much, no electric modalities due to having spinal stimulator   April Manson, PT,DPT 01/16/2023, 1:44 PM

## 2023-01-16 NOTE — Telephone Encounter (Signed)
Refill request received via fax from [facility] for Enbrel  Last Fill: 11/05/2022  Labs: 12/24/2022  Creat 1.14 eGFR 51 MCV 100.7 MCHC 31.4  TB Gold: 11/07/2022 Negative   Next Visit: 04/01/2023  Last Visit: 12/24/2022  [id number] rheumatoid arthritis   Current Dose per office note 12/24/2022: Enbrel 50 mg subcu weekly   Okay to refill Enbrel?

## 2023-01-17 ENCOUNTER — Ambulatory Visit: Payer: Self-pay

## 2023-01-17 NOTE — Patient Instructions (Signed)
Visit Information  Thank you for taking time to visit with me today. Please don't hesitate to contact me if I can be of assistance to you.   Following are the goals we discussed today:   Goals Addressed             This Visit's Progress    assistance with community resources and management of chronic health conditions       Interventions Today    Flowsheet Row Most Recent Value  Chronic Disease   Chronic disease during today's visit Other  [RA, chronic back/ leg pain, falls]  General Interventions   General Interventions Discussed/Reviewed General Interventions Reviewed, Doctor Visits  [evaluation of current treatment plan for mentioned health conditions and patients adherence to plan as established by provider. Assessed for pain level.]  Doctor Visits Discussed/Reviewed Doctor Visits Reviewed  Annabell Sabal upcoming provider visits. Advised to keep follow up appointments as recommended.]  Exercise Interventions   Exercise Discussed/Reviewed Physical Activity  [Advised to do provided home PT exercises. Assessed for ongoing outpatient PT.]  Education Interventions   Education Provided Provided Education  [Advised to contact provider for any concerns related to stimulator.]  Pharmacy Interventions   Pharmacy Dicussed/Reviewed Pharmacy Topics Reviewed  [medications reviewed. Advised to take medications as prescribed.]  Safety Interventions   Safety Discussed/Reviewed Fall Risk  [Assessed for falls. Discussed recent fall and assessed for severe injury. Fall precautions discussed.]              Our next appointment is by telephone on 03/01/23 at 3 pm  Please call the care guide team at [phone] if you need to cancel or reschedule your appointment.   If you are experiencing a Mental Health or Behavioral Health Crisis or need someone to talk to, please call the Suicide and Crisis Lifeline: 988 call 1-800-273-TALK (toll free, 24 hour hotline)  Patient verbalizes understanding of  instructions and care plan provided today and agrees to view in MyChart. Active MyChart status and patient understanding of how to access instructions and care plan via MyChart confirmed with patient.     George Ina RN,BSN,CCM   [facility], [facility] coordinator / Case Manager Phone: [phone]

## 2023-01-17 NOTE — Patient Outreach (Signed)
  Care Coordination   Follow Up Visit Note   01/17/2023 Name: Lori Orozco MRN: [id number] DOB: 03/14/1950  Lori Orozco is a 72 y.o. year old female who sees Tower, Audrie Gallus, MD for primary care. I spoke with  [name] by phone today.  What matters to the patients health and wellness today?  Patient states she has been without her stimulator x 4 days. She states she has received it in the mail. She states currently she in  not in any pain due to inactivity.  Patient states she is attending outpatient physical therapy 2 x per week. She states her rheumatologist ordered this for her to help with balance and strengthening. Patient reports having a recent fall. She states she fell over into the tub hitting her shoulder and head. Patient reports being evaluation by EMS.  No serious injury noted only bruising. .  Patient states she has decided to hold off on her back surgery because she has not been in pain.       Goals Addressed             This Visit's Progress    assistance with community resources and management of chronic health conditions       Interventions Today    Flowsheet Row Most Recent Value  Chronic Disease   Chronic disease during today's visit Other  [RA, chronic back/ leg pain, falls]  General Interventions   General Interventions Discussed/Reviewed General Interventions Reviewed, Doctor Visits  [evaluation of current treatment plan for mentioned health conditions and patients adherence to plan as established by provider. Assessed for pain level.]  Doctor Visits Discussed/Reviewed Doctor Visits Reviewed  Annabell Sabal upcoming provider visits. Advised to keep follow up appointments as recommended.]  Exercise Interventions   Exercise Discussed/Reviewed Physical Activity  [Advised to do provided home PT exercises. Assessed for ongoing outpatient PT.]  Education Interventions   Education Provided Provided Education  [Advised to contact provider for any  concerns related to stimulator.]  Pharmacy Interventions   Pharmacy Dicussed/Reviewed Pharmacy Topics Reviewed  [medications reviewed. Advised to take medications as prescribed.]  Safety Interventions   Safety Discussed/Reviewed Fall Risk  [Assessed for falls. Discussed recent fall and assessed for severe injury. Fall precautions discussed.]              SDOH assessments and interventions completed:  No     Care Coordination Interventions:  Yes, provided   Follow up plan: Follow up call scheduled for 03/01/23    Encounter Outcome:  Patient Visit Completed   George Ina RN,BSN,CCM [facility]  [facility], [facility] coordinator / Case Manager Phone: [phone]

## 2023-01-21 ENCOUNTER — Encounter: Payer: Self-pay | Admitting: Physical Therapy

## 2023-01-21 ENCOUNTER — Ambulatory Visit (INDEPENDENT_AMBULATORY_CARE_PROVIDER_SITE_OTHER): Payer: Medicare Other | Admitting: Physical Therapy

## 2023-01-21 DIAGNOSIS — M5459 Other low back pain: Secondary | ICD-10-CM | POA: Diagnosis not present

## 2023-01-21 DIAGNOSIS — R2689 Other abnormalities of gait and mobility: Secondary | ICD-10-CM | POA: Diagnosis not present

## 2023-01-21 DIAGNOSIS — R296 Repeated falls: Secondary | ICD-10-CM | POA: Diagnosis not present

## 2023-01-21 DIAGNOSIS — M6281 Muscle weakness (generalized): Secondary | ICD-10-CM | POA: Diagnosis not present

## 2023-01-21 NOTE — Therapy (Signed)
OUTPATIENT PHYSICAL THERAPY TREATMENT    Patient Name: Lori Orozco MRN: [id number] DOB:[birth year], 72 y.o., female Today's Date: 01/21/2023  END OF SESSION:  PT End of Session - 01/21/23 1435     Visit Number 6    Number of Visits 15    Date for PT Re-Evaluation 02/20/23    Authorization Type MCR    Progress Note Due on Visit 10    PT Start Time 1345    PT Stop Time 1425    PT Time Calculation (min) 40 min    Activity Tolerance Patient tolerated treatment well               Past Medical History:  Diagnosis Date   Allergy Hayfever   1958   Amaurosis fugax    Anemia    hx   Anxiety    Arthritis    Asthma    Cataract    Chronic fatigue syndrome    Chronic kidney disease    frequency, nephritis when 72 yrs old   COPD (chronic obstructive pulmonary disease) (HCC)    COVID-19    Depression    Diverticulitis    Emphysema of lung (HCC)    Fibromyalgia    GERD (gastroesophageal reflux disease)    occ   H/O hiatal hernia    History of bronchitis    Hyperlipidemia    Hypothyroidism    Interstitial cystitis    Irritable bowel syndrome    Lichen sclerosus    Lumbar herniated disc    Migraines    Motor nerve conduction block 11/2021   Osteoporosis 09/10/2022   Pneumonia    PONV (postoperative nausea and vomiting)    Shortness of breath    exertion    Thyroid disease    [place]   Urinary frequency    Urinary tract infection    Vertigo    Past Surgical History:  Procedure Laterality Date   ABDOMINAL HYSTERECTOMY     bladder sugery      CAROTID DOPPLAR     COLONOSCOPY  05/26/2010   avms- otherwise nl , re check 10y   DEXA-OSTEOPENIA     DOPPLER ECHOCARDIOGRAPHY     elbow surgery     EPICONDYLITIS     EYE SURGERY Bilateral    cataracts   FOOT SURGERY Bilateral    I & D EXTREMITY Right 09/01/2020   Procedure: TYNOSYNOVECTOMY;  Surgeon: [name], MD;  Location: [facility] OR;  Service: Orthopedics;  Laterality: Right;   IMPLANTATION  VAGAL NERVE STIMULATOR  03/2022   JOINT REPLACEMENT     KNEE SURGERY     Left cartilage   lipoma in second finger right hand     MOUTH SURGERY  08/22/2022   PLANTAR FASCIA SURGERY Left    REVERSE SHOULDER ARTHROPLASTY Left 02/02/2020   Procedure: LEFT REVERSE SHOULDER ARTHROPLASTY;  Surgeon: [name], MD;  Location: [facility] OR;  Service: Orthopedics;  Laterality: Left;   ROTATOR CUFF REPAIR Left 12/2017   TEMPOROMANDIBULAR JOINT SURGERY     TONSILLECTOMY     TOTAL HIP ARTHROPLASTY Right 03/20/2013   Procedure: Right TOTAL HIP ARTHROPLASTY;  Surgeon: [name], MD;  Location: [facility] OR;  Service: Orthopedics;  Laterality: Right;  Right Total Hip Arthroplasty   TOTAL KNEE ARTHROPLASTY Left 01/28/2015   Procedure: LEFT TOTAL KNEE ARTHROPLASTY;  Surgeon: [name], MD;  Location: [facility];  Service: Orthopedics;  Laterality: Left;   TRIGGER FINGER RELEASE Right    TROCHANTERIC  BURSA EXCISION Right 05/03/2016   Dr. Doneen Poisson   WRIST ARTHROSCOPY Right    ligament tear   Patient Active Problem List   Diagnosis Date Noted   Recurrent falls 12/24/2022   Pre-operative clearance 10/30/2022   Diarrhea 10/19/2022   Cold intolerance 10/19/2022   Osteoporosis 09/10/2022   Laceration of forehead 08/29/2022   Thoracic compression fracture (HCC) 07/13/2022   Diabetes mellitus screening 07/13/2022   Lumbar herniated disc    Chronic pain 11/28/2021   Bilateral leg weakness 10/09/2021   Vitamin B12 deficiency 08/02/2021   Myofascial pain dysfunction syndrome 08/02/2021   Adverse effect of prednisone 07/11/2021   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   History of pulmonary embolism 12/08/2020   Arthritis of knee 12/06/2020   Bilateral knee pain 09/20/2020   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   High risk medication use 09/20/2020   Extensor tenosynovitis of right wrist 09/15/2020   S/P reverse total shoulder arthroplasty, 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    Lung nodule 01/02/2020   Status post arthroscopy of left shoulder 01/16/2018   Estrogen deficiency 06/28/2017   Medial epicondylitis, left elbow 04/29/2017   S/P arthroscopy of left shoulder 02/25/2017   Impingement syndrome of left shoulder 01/30/2017   Trochanteric bursitis, right hip 04/10/2016   Hypertension 09/02/2015   Urge incontinence 07/27/2015   Obesity (BMI 30-39.9) 04/28/2015   Osteoarthritis of left knee 01/28/2015   Status post total left knee replacement 01/28/2015   Vitamin D deficiency 04/27/2014   Seasonal and perennial allergic rhinitis 06/17/2013   S/P total hip arthroplasty 03/20/2013   Medicare annual wellness visit, subsequent 11/18/2012   History of falling 11/18/2012   Routine general medical examination at a health care facility 11/10/2012   Lichen sclerosus et atrophicus of the vulva 07/29/2012   Osteopenia 10/19/2011   Hypothyroidism 10/19/2011   Hereditary and idiopathic peripheral neuropathy 08/05/2009   Low back pain 08/05/2009   HAND PAIN, BILATERAL 08/05/2009   TREMOR 03/09/2008   MENOPAUSAL SYNDROME 10/09/2007   DEPRESSION, MAJOR 05/20/2007   BIPOLAR AFFECTIVE DISORDER 05/20/2007   Generalized anxiety disorder 05/20/2007   PERSONALITY DISORDER 05/20/2007   ESOPHAGEAL SPASM 05/20/2007   Diaphragmatic hernia 05/20/2007   COPD mixed type (HCC) 03/27/2007   HYPERCHOLESTEROLEMIA, PURE 12/10/2006   SYMPTOM, SYNDROME, CHRONIC FATIGUE 12/10/2006    PCP: [name], MD   REFERRING PROVIDER: Fuller Plan, MD   REFERRING DIAG: R29.6 (ICD-10-CM) - Recurrent falls   Rationale for Evaluation and Treatment: Rehabilitation  THERAPY DIAG:  Other abnormalities of gait and mobility  Repeated falls  Muscle weakness (generalized)  Other low back pain  ONSET DATE: 12/17/22, most recent fall   SUBJECTIVE:  SUBJECTIVE STATEMENT: Relays the pain is not as bad today, she has her spinal stimulator working again.  PERTINENT HISTORY:  PMH includes: amaurosis Fugax, anxiety, chronic fatigue syndrome, CKD, COPD, COVID, Emphysema, Fibromyalgia, IBS, Linchen sclerosus, Migraines, vertigo, dexa - osteopenia, s/p bil. TKA, s/p Lt TSA, spinal stimulator  PAIN:  Are you having pain? Yes: NPRS scale: 3/10 Pain location: low back Pain description: constant sharp pain Aggravating factors: sitting in one position, standing too long,  Relieving factors: stimulator  PRECAUTIONS:  Fall  RED FLAGS: None   WEIGHT BEARING RESTRICTIONS:  No  FALLS:  Has patient fallen in last 6 months? Yes. Number of falls 4  LIVING ENVIRONMENT:  Stairs: yes, has chair lift she prefers to use Lives with sister  OCCUPATION:  none  PLOF:  Needs RW for ambulation, can dress herself, has to sponge bath, can not stand long enough for cooking or housework.   PATIENT GOALS: reduce falls, strength legs, improve standing tolernace   NEXT MD VISIT: 04/01/23   OBJECTIVE:  Note: Objective measures were completed at Evaluation unless otherwise noted.  PATIENT SURVEYS:  Eval: FOTO 38% functional, goal is 44%  COGNITIVE STATUS: Within functional limits for tasks assessed    GAIT: Eval Distance walked: 20 feet Assistive device utilized: Walker - 4 wheeled, upright Level of assistance: SBA Comments: decreased walking tolerance due to pain and weakness   LOWER EXTREMITY MMT:    MMT tested in sitting Right eval Left eval Right/Left 01/16/23  Hip flexion 3 3 3+/3+  Hip extension     Hip abduction 3 3 3+/3+  Hip adduction     Hip internal rotation 3 3   Hip external rotation 3 3   Knee flexion 4 4 4/4  Knee extension 4 4 4/4  Ankle dorsiflexion 3 3   Ankle plantarflexion 3 4   Ankle inversion     Ankle eversion      (Blank rows = not tested)     FUNCTIONAL TESTS:  Eval TUG time with upright RW 30.75 sec Balance test:  MCTSIB  Feet apart eyes open 30 sec with supervision  Feet apart eyes closed 10 sec with CGA  Feet apart of foam eyes open 10 seconds  Feet apart on foam eyes closed 7 seconds  =57/120  Feet together eyes open 7 seconds     TREATMENT:                                                                                                                              DATE:   01/21/23 Nu step L5 UE/LE seat #10 for 15 min for functional endurance and ROM Walking in bars without UE support down forward and back backwards X 2 round trips Sidestepping in bars 2 round trips with UE support Ambulation 50 feet X 2 as well as 100 feet X 1 max tolerated distance with rollator and supervision, spread out throughout session Seated LAQ 3# 3X10 bilat  Standing feet together on airex pad 30 sec X 2 Leg press machine 50# 2X10 DL   08/26/14 Nu step L5 UE/LE seat #10 for 15 min for functional endurance and ROM Ambulation 50 feet X 2 as well as 100 feet X 1 max tolerated distance with rollator and supervision, spread out throughout session Seated LAQ 3# 3X10 bilat Standing feet together on airex pad 30 sec X 2 Sit to stand X 5 reps from chair with airex pad  Sidestepping at counter top 2 round trips with UE support  01/14/23 Nu step L5 UE/LE seat #10 for 15 min for functional endurance and ROM Ambulation 50 feet X 3 with rollator and supervision, spread out throughout session Seated LAQ 3# 3X10 bilat Standing feet together on airex pad 30 sec X 2 Sit to stand X 5 reps from chair spread out throughout session Sidestepping at counter top 2 round trips with UE support  01/10/23 Nu step L5 UE/LE seat #10 for 12 min with heat to low back. Ambulation 50 feet X 3 with rollator and supervision, spread out throughout session Seated LAQ 2# 2X10 bilat Seated hamstring curls green 2X10 bilat Seated SLR 2X10 bilat Sit to stand X 5  reps from chair with airex pad  Standing hip abduction X 10 bilat    PATIENT EDUCATION:  Education details: PT plan of care, HEP Person educated: Patient and sister Education method: Explanation, Demonstration, Verbal cues, and Handouts Education comprehension: verbalized understanding and needs further education  HOME EXERCISE PROGRAM: Access Code: URL: [email or web address] Date: 12/26/2022 Prepared by: Ivery Quale  Exercises - Seated Long Arc Quad  - 2 x daily - 6 x weekly - 2-3 sets - 10 reps - Seated Straight Leg Raise with Quad Contraction  - 2 x daily - 6 x weekly - 3 sets - 10 reps - Standing Hip Abduction with Counter Support  - 2 x daily - 6 x weekly - 1-2 sets - 10 reps - alternating marching  - 2 x daily - 6 x weekly - 1-2 sets - 10 reps - Heel Toe Raises with Counter Support  - 2 x daily - 6 x weekly - 2 sets - 10 reps - Romberg Stance  - 2 x daily - 6 x weekly - 1 sets - 3 reps - 20-30 sec hold - Wide Stance with Head Rotations and Unilateral Counter Support  - 2 x daily - 6 x weekly - 1 sets - 10-15 reps - Wide Stance with Head Nods and Counter Support  - 2 x daily - 6 x weekly - 1 sets - 10-15 reps - Standing Romberg to 1/2 Tandem Stance  - 2 x daily - 6 x weekly - 3 sets - 3 reps - 10 sec hold   ASSESSMENT:  CLINICAL IMPRESSION: She had overall less pain upon arrival improving exercise tolerance some but standing activity remains very limited and we will work to try to slowly progress this as tolerated without causing too much pain.    OBJECTIVE IMPAIRMENTS: Abnormal gait, cardiopulmonary status limiting activity, decreased activity tolerance, decreased balance, decreased coordination, decreased endurance, decreased mobility, difficulty walking, decreased ROM, decreased strength, and pain.   ACTIVITY LIMITATIONS: carrying, lifting, bending, sitting, standing, squatting, sleeping, stairs, transfers, bed mobility, bathing, toileting,  hygiene/grooming, and locomotion level  PARTICIPATION LIMITATIONS: meal prep, cleaning, laundry, shopping, and community activity  PERSONAL FACTORS: Fitness, Past/current experiences, and 3+ comorbidities:    are also affecting patient's functional outcome.   REHAB POTENTIAL:  Fair    CLINICAL DECISION MAKING: Evolving/moderate complexity  EVALUATION COMPLEXITY: Moderate   GOALS: Goals reviewed with patient? Yes Short term PT Goals Target date: 01/23/2023   Pt will be I and compliant with HEP. Baseline:  Goal status: ongoing 01/07/23   Long term PT goals Target date:02/20/2023   Pt will improve MCTSIB balance test score to at least 80/120 to show reduced risk for falling Baseline: Goal status: ongoing 01/07/23 Pt will improve  hip/knee strength to at least 4/5 MMT to improve functional strength Baseline: Goal status: ongoing 01/07/23 Pt will improve FOTO to at least 44% functional to show improved function Baseline: Goal status: ongoing 01/07/23 Pt report no new falls since starting PT. Goal status: ongoing 01/07/23   PLAN:  PT FREQUENCY: 1-2x/week  PT DURATION: 8 weeks  PLANNED INTERVENTIONS: 97164- PT Re-evaluation, 97110-Therapeutic exercises, 97530- Therapeutic activity, 97112- Neuromuscular re-education, 97535- Self Care, 16109- Manual therapy, and 97116- Gait training.  PLAN FOR NEXT SESSION:  needs balance work, endurance, and standing tolerance but careful not to aggravate her back pain too much, no electric modalities due to having spinal stimulator   April Manson, PT,DPT 01/21/2023, 2:36 PM

## 2023-01-22 ENCOUNTER — Other Ambulatory Visit: Payer: Self-pay | Admitting: *Deleted

## 2023-01-22 DIAGNOSIS — M0600 Rheumatoid arthritis without rheumatoid factor, unspecified site: Secondary | ICD-10-CM

## 2023-01-22 MED ORDER — PREDNISONE 10 MG PO TABS: 10.0000 mg | ORAL_TABLET | Freq: Every day | ORAL | 0 refills | Status: DC

## 2023-01-22 NOTE — Telephone Encounter (Signed)
Patient contacted the office and requested a refill on Prednisone.   Last Fill: 09/10/2022  Next Visit: 04/01/2023  Last Visit: 12/24/2022  Dx: Seronegative rheumatoid arthritis   Current Dose per office note on 12/24/2022: prednisone 10 mg daily   Okay to refill Prednisone?

## 2023-01-23 ENCOUNTER — Ambulatory Visit (INDEPENDENT_AMBULATORY_CARE_PROVIDER_SITE_OTHER): Payer: Medicare Other | Admitting: Physical Therapy

## 2023-01-23 ENCOUNTER — Encounter: Payer: Self-pay | Admitting: Physical Therapy

## 2023-01-23 DIAGNOSIS — R296 Repeated falls: Secondary | ICD-10-CM | POA: Diagnosis not present

## 2023-01-23 DIAGNOSIS — M5459 Other low back pain: Secondary | ICD-10-CM

## 2023-01-23 DIAGNOSIS — R2689 Other abnormalities of gait and mobility: Secondary | ICD-10-CM

## 2023-01-23 DIAGNOSIS — M6281 Muscle weakness (generalized): Secondary | ICD-10-CM | POA: Diagnosis not present

## 2023-01-23 NOTE — Therapy (Signed)
OUTPATIENT PHYSICAL THERAPY TREATMENT    Patient Name: Lori Orozco MRN: [id number] DOB:1950/11/03, 72 y.o., female Today's Date: 01/23/2023  END OF SESSION:  PT End of Session - 01/23/23 1316     Visit Number 7    Number of Visits 15    Date for PT Re-Evaluation 02/20/23    Authorization Type MCR    Progress Note Due on Visit 10    PT Start Time 1300    PT Stop Time 1340    PT Time Calculation (min) 40 min    Activity Tolerance Patient tolerated treatment well               Past Medical History:  Diagnosis Date   Allergy Hayfever   1958   Amaurosis fugax    Anemia    hx   Anxiety    Arthritis    Asthma    Cataract    Chronic fatigue syndrome    Chronic kidney disease    frequency, nephritis when 71 yrs old   COPD (chronic obstructive pulmonary disease) (HCC)    COVID-19    Depression    Diverticulitis    Emphysema of lung (HCC)    Fibromyalgia    GERD (gastroesophageal reflux disease)    occ   H/O hiatal hernia    History of bronchitis    Hyperlipidemia    Hypothyroidism    Interstitial cystitis    Irritable bowel syndrome    Lichen sclerosus    Lumbar herniated disc    Migraines    Motor nerve conduction block 11/2021   Osteoporosis 09/10/2022   Pneumonia    PONV (postoperative nausea and vomiting)    Shortness of breath    exertion    Thyroid disease    [place]   Urinary frequency    Urinary tract infection    Vertigo    Past Surgical History:  Procedure Laterality Date   ABDOMINAL HYSTERECTOMY     bladder sugery      CAROTID DOPPLAR     COLONOSCOPY  05/26/2010   avms- otherwise nl , re check 10y   DEXA-OSTEOPENIA     DOPPLER ECHOCARDIOGRAPHY     elbow surgery     EPICONDYLITIS     EYE SURGERY Bilateral    cataracts   FOOT SURGERY Bilateral    I & D EXTREMITY Right 09/01/2020   Procedure: TYNOSYNOVECTOMY;  Surgeon: [name], MD;  Location: [facility] OR;  Service: Orthopedics;  Laterality: Right;   IMPLANTATION  VAGAL NERVE STIMULATOR  03/2022   JOINT REPLACEMENT     KNEE SURGERY     Left cartilage   lipoma in second finger right hand     MOUTH SURGERY  08/22/2022   PLANTAR FASCIA SURGERY Left    REVERSE SHOULDER ARTHROPLASTY Left 02/02/2020   Procedure: LEFT REVERSE SHOULDER ARTHROPLASTY;  Surgeon: [name], MD;  Location: [facility] OR;  Service: Orthopedics;  Laterality: Left;   ROTATOR CUFF REPAIR Left 12/2017   TEMPOROMANDIBULAR JOINT SURGERY     TONSILLECTOMY     TOTAL HIP ARTHROPLASTY Right 03/20/2013   Procedure: Right TOTAL HIP ARTHROPLASTY;  Surgeon: [name], MD;  Location: [facility] OR;  Service: Orthopedics;  Laterality: Right;  Right Total Hip Arthroplasty   TOTAL KNEE ARTHROPLASTY Left 01/28/2015   Procedure: LEFT TOTAL KNEE ARTHROPLASTY;  Surgeon: [name], MD;  Location: [facility];  Service: Orthopedics;  Laterality: Left;   TRIGGER FINGER RELEASE Right    TROCHANTERIC  BURSA EXCISION Right 05/03/2016   Dr. Doneen Poisson   WRIST ARTHROSCOPY Right    ligament tear   Patient Active Problem List   Diagnosis Date Noted   Recurrent falls 12/24/2022   Pre-operative clearance 10/30/2022   Diarrhea 10/19/2022   Cold intolerance 10/19/2022   Osteoporosis 09/10/2022   Laceration of forehead 08/29/2022   Thoracic compression fracture (HCC) 07/13/2022   Diabetes mellitus screening 07/13/2022   Lumbar herniated disc    Chronic pain 11/28/2021   Bilateral leg weakness 10/09/2021   Vitamin B12 deficiency 08/02/2021   Myofascial pain dysfunction syndrome 08/02/2021   Adverse effect of prednisone 07/11/2021   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   History of pulmonary embolism 12/08/2020   Arthritis of knee 12/06/2020   Bilateral knee pain 09/20/2020   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   High risk medication use 09/20/2020   Extensor tenosynovitis of right wrist 09/15/2020   S/P reverse total shoulder arthroplasty, 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    Lung nodule 01/02/2020   Status post arthroscopy of left shoulder 01/16/2018   Estrogen deficiency 06/28/2017   Medial epicondylitis, left elbow 04/29/2017   S/P arthroscopy of left shoulder 02/25/2017   Impingement syndrome of left shoulder 01/30/2017   Trochanteric bursitis, right hip 04/10/2016   Hypertension 09/02/2015   Urge incontinence 07/27/2015   Obesity (BMI 30-39.9) 04/28/2015   Osteoarthritis of left knee 01/28/2015   Status post total left knee replacement 01/28/2015   Vitamin D deficiency 04/27/2014   Seasonal and perennial allergic rhinitis 06/17/2013   S/P total hip arthroplasty 03/20/2013   Medicare annual wellness visit, subsequent 11/18/2012   History of falling 11/18/2012   Routine general medical examination at a health care facility 11/10/2012   Lichen sclerosus et atrophicus of the vulva 07/29/2012   Osteopenia 10/19/2011   Hypothyroidism 10/19/2011   Hereditary and idiopathic peripheral neuropathy 08/05/2009   Low back pain 08/05/2009   HAND PAIN, BILATERAL 08/05/2009   TREMOR 03/09/2008   MENOPAUSAL SYNDROME 10/09/2007   DEPRESSION, MAJOR 05/20/2007   BIPOLAR AFFECTIVE DISORDER 05/20/2007   Generalized anxiety disorder 05/20/2007   PERSONALITY DISORDER 05/20/2007   ESOPHAGEAL SPASM 05/20/2007   Diaphragmatic hernia 05/20/2007   COPD mixed type (HCC) 03/27/2007   HYPERCHOLESTEROLEMIA, PURE 12/10/2006   SYMPTOM, SYNDROME, CHRONIC FATIGUE 12/10/2006    PCP: [name], MD   REFERRING PROVIDER: Fuller Plan, MD   REFERRING DIAG: R29.6 (ICD-10-CM) - Recurrent falls   Rationale for Evaluation and Treatment: Rehabilitation  THERAPY DIAG:  Other abnormalities of gait and mobility  Repeated falls  Muscle weakness (generalized)  Other low back pain  ONSET DATE: 12/17/22, most recent fall   SUBJECTIVE:  SUBJECTIVE STATEMENT: Relays pain is about 6/10 today  PERTINENT HISTORY:  PMH includes: amaurosis Fugax, anxiety, chronic fatigue syndrome, CKD, COPD, COVID, Emphysema, Fibromyalgia, IBS, Linchen sclerosus, Migraines, vertigo, dexa - osteopenia, s/p bil. TKA, s/p Lt TSA, spinal stimulator  PAIN:  Are you having pain? Yes: NPRS scale: 6/10 Pain location: low back Pain description: constant sharp pain Aggravating factors: sitting in one position, standing too long,  Relieving factors: stimulator  PRECAUTIONS:  Fall  RED FLAGS: None   WEIGHT BEARING RESTRICTIONS:  No  FALLS:  Has patient fallen in last 6 months? Yes. Number of falls 4  LIVING ENVIRONMENT:  Stairs: yes, has chair lift she prefers to use Lives with sister  OCCUPATION:  none  PLOF:  Needs RW for ambulation, can dress herself, has to sponge bath, can not stand long enough for cooking or housework.   PATIENT GOALS: reduce falls, strength legs, improve standing tolernace   NEXT MD VISIT: 04/01/23   OBJECTIVE:  Note: Objective measures were completed at Evaluation unless otherwise noted.  PATIENT SURVEYS:  Eval: FOTO 38% functional, goal is 44%  COGNITIVE STATUS: Within functional limits for tasks assessed    GAIT: Eval Distance walked: 20 feet Assistive device utilized: Walker - 4 wheeled, upright Level of assistance: SBA Comments: decreased walking tolerance due to pain and weakness   LOWER EXTREMITY MMT:    MMT tested in sitting Right eval Left eval Right/Left 01/16/23  Hip flexion 3 3 3+/3+  Hip extension     Hip abduction 3 3 3+/3+  Hip adduction     Hip internal rotation 3 3   Hip external rotation 3 3   Knee flexion 4 4 4/4  Knee extension 4 4 4/4  Ankle dorsiflexion 3 3   Ankle plantarflexion 3 4   Ankle inversion     Ankle eversion      (Blank rows = not tested)    FUNCTIONAL TESTS:  Eval TUG time with upright RW  30.75 sec Balance test:  MCTSIB  Feet apart eyes open 30 sec with supervision  Feet apart eyes closed 10 sec with CGA  Feet apart of foam eyes open 10 seconds  Feet apart on foam eyes closed 7 seconds  =57/120  Feet together eyes open 7 seconds     TREATMENT:                                                                                                                              DATE:   01/23/23 Nu step L5 UE/LE seat #10 for 15 min for functional endurance and ROM Walking in bars without UE support down forward and back backwards X 2 round trips Sidestepping in bars 2 round trips with UE support Seated lumbar flexion stretch with ball 5 sec X 10 Ambulation 50 feet X 2 as well as 100 feet X 1 max tolerated distance with rollator and supervision, spread out throughout session Seated LAQ 3# 3X10  bilat Standing feet together on airex pad 30 sec X 2 Leg press machine 62# 2X12 DL  95/63/87 Nu step L5 UE/LE seat #10 for 15 min for functional endurance and ROM Walking in bars without UE support down forward and back backwards X 2 round trips Sidestepping in bars 2 round trips with UE support Ambulation 50 feet X 2 as well as 100 feet X 1 max tolerated distance with rollator and supervision, spread out throughout session Seated LAQ 3# 3X10 bilat Standing feet together on airex pad 30 sec X 2 Leg press machine 50# 2X10 DL   56/43/32 Nu step L5 UE/LE seat #10 for 15 min for functional endurance and ROM Ambulation 50 feet X 2 as well as 100 feet X 1 max tolerated distance with rollator and supervision, spread out throughout session Seated LAQ 3# 3X10 bilat Standing feet together on airex pad 30 sec X 2 Sit to stand X 5 reps from chair with airex pad  Sidestepping at counter top 2 round trips with UE support  01/14/23 Nu step L5 UE/LE seat #10 for 15 min for functional endurance and ROM Ambulation 50 feet X 3 with rollator and supervision, spread out throughout session Seated LAQ  3# 3X10 bilat Standing feet together on airex pad 30 sec X 2 Sit to stand X 5 reps from chair spread out throughout session Sidestepping at counter top 2 round trips with UE support  01/10/23 Nu step L5 UE/LE seat #10 for 12 min with heat to low back. Ambulation 50 feet X 3 with rollator and supervision, spread out throughout session Seated LAQ 2# 2X10 bilat Seated hamstring curls green 2X10 bilat Seated SLR 2X10 bilat Sit to stand X 5 reps from chair with airex pad  Standing hip abduction X 10 bilat    PATIENT EDUCATION:  Education details: PT plan of care, HEP Person educated: Patient and sister Education method: Explanation, Demonstration, Verbal cues, and Handouts Education comprehension: verbalized understanding and needs further education  HOME EXERCISE PROGRAM: Access Code: URL: [email or web address] Date: 12/26/2022 Prepared by: Ivery Quale  Exercises - Seated Long Arc Quad  - 2 x daily - 6 x weekly - 2-3 sets - 10 reps - Seated Straight Leg Raise with Quad Contraction  - 2 x daily - 6 x weekly - 3 sets - 10 reps - Standing Hip Abduction with Counter Support  - 2 x daily - 6 x weekly - 1-2 sets - 10 reps - alternating marching  - 2 x daily - 6 x weekly - 1-2 sets - 10 reps - Heel Toe Raises with Counter Support  - 2 x daily - 6 x weekly - 2 sets - 10 reps - Romberg Stance  - 2 x daily - 6 x weekly - 1 sets - 3 reps - 20-30 sec hold - Wide Stance with Head Rotations and Unilateral Counter Support  - 2 x daily - 6 x weekly - 1 sets - 10-15 reps - Wide Stance with Head Nods and Counter Support  - 2 x daily - 6 x weekly - 1 sets - 10-15 reps - Standing Romberg to 1/2 Tandem Stance  - 2 x daily - 6 x weekly - 3 sets - 3 reps - 10 sec hold   ASSESSMENT:  CLINICAL IMPRESSION: She is having a lot of pain again and standing tolerance remains limited. We will measure next week to see what functional progress she has made as pain levels have not  improved that  much.   OBJECTIVE IMPAIRMENTS: Abnormal gait, cardiopulmonary status limiting activity, decreased activity tolerance, decreased balance, decreased coordination, decreased endurance, decreased mobility, difficulty walking, decreased ROM, decreased strength, and pain.   ACTIVITY LIMITATIONS: carrying, lifting, bending, sitting, standing, squatting, sleeping, stairs, transfers, bed mobility, bathing, toileting, hygiene/grooming, and locomotion level  PARTICIPATION LIMITATIONS: meal prep, cleaning, laundry, shopping, and community activity  PERSONAL FACTORS: Fitness, Past/current experiences, and 3+ comorbidities:    are also affecting patient's functional outcome.   REHAB POTENTIAL: Fair    CLINICAL DECISION MAKING: Evolving/moderate complexity  EVALUATION COMPLEXITY: Moderate   GOALS: Goals reviewed with patient? Yes Short term PT Goals Target date: 01/23/2023   Pt will be I and compliant with HEP. Baseline:  Goal status: ongoing 01/07/23   Long term PT goals Target date:02/20/2023   Pt will improve MCTSIB balance test score to at least 80/120 to show reduced risk for falling Baseline: Goal status: ongoing 01/07/23 Pt will improve  hip/knee strength to at least 4/5 MMT to improve functional strength Baseline: Goal status: ongoing 01/07/23 Pt will improve FOTO to at least 44% functional to show improved function Baseline: Goal status: ongoing 01/07/23 Pt report no new falls since starting PT. Goal status: ongoing 01/07/23   PLAN:  PT FREQUENCY: 1-2x/week  PT DURATION: 8 weeks  PLANNED INTERVENTIONS: 97164- PT Re-evaluation, 97110-Therapeutic exercises, 97530- Therapeutic activity, 97112- Neuromuscular re-education, 97535- Self Care, 16109- Manual therapy, and 97116- Gait training.  PLAN FOR NEXT SESSION:  reassess functional progress.   April Manson, PT,DPT 01/23/2023, 1:17 PM

## 2023-01-25 ENCOUNTER — Encounter: Payer: Self-pay | Admitting: Internal Medicine

## 2023-01-28 ENCOUNTER — Ambulatory Visit (INDEPENDENT_AMBULATORY_CARE_PROVIDER_SITE_OTHER): Payer: Medicare Other | Admitting: Physical Therapy

## 2023-01-28 ENCOUNTER — Encounter: Payer: Self-pay | Admitting: Physical Therapy

## 2023-01-28 DIAGNOSIS — M6281 Muscle weakness (generalized): Secondary | ICD-10-CM | POA: Diagnosis not present

## 2023-01-28 DIAGNOSIS — M5459 Other low back pain: Secondary | ICD-10-CM

## 2023-01-28 DIAGNOSIS — R2689 Other abnormalities of gait and mobility: Secondary | ICD-10-CM

## 2023-01-28 DIAGNOSIS — R296 Repeated falls: Secondary | ICD-10-CM | POA: Diagnosis not present

## 2023-01-28 NOTE — Therapy (Signed)
OUTPATIENT PHYSICAL THERAPY TREATMENT/DISCHARGE PHYSICAL THERAPY DISCHARGE SUMMARY  Visits from Start of Care: 8  Current functional level related to goals / functional outcomes: See below   Remaining deficits: See below   Education / Equipment: HEP  Plan:  Patient goals were not all met. Patient is being discharged due to no improvements in pain      Patient Name: Lori Orozco MRN: [id number] DOB:02-14-1951, 72 y.o., female Today's Date: 01/28/2023  END OF SESSION:  PT End of Session - 01/28/23 1306     Visit Number 8    Number of Visits 15    Date for PT Re-Evaluation 02/20/23    Authorization Type MCR    Progress Note Due on Visit 10    PT Start Time 1300    PT Stop Time 1340    PT Time Calculation (min) 40 min    Activity Tolerance Patient tolerated treatment well               Past Medical History:  Diagnosis Date   Allergy Hayfever   1958   Amaurosis fugax    Anemia    hx   Anxiety    Arthritis    Asthma    Cataract    Chronic fatigue syndrome    Chronic kidney disease    frequency, nephritis when 72 yrs old   COPD (chronic obstructive pulmonary disease) (HCC)    COVID-19    Depression    Diverticulitis    Emphysema of lung (HCC)    Fibromyalgia    GERD (gastroesophageal reflux disease)    occ   H/O hiatal hernia    History of bronchitis    Hyperlipidemia    Hypothyroidism    Interstitial cystitis    Irritable bowel syndrome    Lichen sclerosus    Lumbar herniated disc    Migraines    Motor nerve conduction block 11/2021   Osteoporosis 09/10/2022   Pneumonia    PONV (postoperative nausea and vomiting)    Shortness of breath    exertion    Thyroid disease    [place]   Urinary frequency    Urinary tract infection    Vertigo    Past Surgical History:  Procedure Laterality Date   ABDOMINAL HYSTERECTOMY     bladder sugery      CAROTID DOPPLAR     COLONOSCOPY  05/26/2010   avms- otherwise nl , re check 10y    DEXA-OSTEOPENIA     DOPPLER ECHOCARDIOGRAPHY     elbow surgery     EPICONDYLITIS     EYE SURGERY Bilateral    cataracts   FOOT SURGERY Bilateral    I & D EXTREMITY Right 09/01/2020   Procedure: TYNOSYNOVECTOMY;  Surgeon: [name], MD;  Location: [facility] OR;  Service: Orthopedics;  Laterality: Right;   IMPLANTATION VAGAL NERVE STIMULATOR  03/2022   JOINT REPLACEMENT     KNEE SURGERY     Left cartilage   lipoma in second finger right hand     MOUTH SURGERY  08/22/2022   PLANTAR FASCIA SURGERY Left    REVERSE SHOULDER ARTHROPLASTY Left 02/02/2020   Procedure: LEFT REVERSE SHOULDER ARTHROPLASTY;  Surgeon: [name], MD;  Location: [facility] OR;  Service: Orthopedics;  Laterality: Left;   ROTATOR CUFF REPAIR Left 12/2017   TEMPOROMANDIBULAR JOINT SURGERY     TONSILLECTOMY     TOTAL HIP ARTHROPLASTY Right 03/20/2013   Procedure: Right TOTAL HIP ARTHROPLASTY;  Surgeon: [name],  MD;  Location: [facility] OR;  Service: Orthopedics;  Laterality: Right;  Right Total Hip Arthroplasty   TOTAL KNEE ARTHROPLASTY Left 01/28/2015   Procedure: LEFT TOTAL KNEE ARTHROPLASTY;  Surgeon: [name], MD;  Location: [facility];  Service: Orthopedics;  Laterality: Left;   TRIGGER FINGER RELEASE Right    TROCHANTERIC BURSA EXCISION Right 05/03/2016   Dr. Doneen Poisson   WRIST ARTHROSCOPY Right    ligament tear   Patient Active Problem List   Diagnosis Date Noted   Recurrent falls 12/24/2022   Pre-operative clearance 10/30/2022   Diarrhea 10/19/2022   Cold intolerance 10/19/2022   Osteoporosis 09/10/2022   Laceration of forehead 08/29/2022   Thoracic compression fracture (HCC) 07/13/2022   Diabetes mellitus screening 07/13/2022   Lumbar herniated disc    Chronic pain 11/28/2021   Bilateral leg weakness 10/09/2021   Vitamin B12 deficiency 08/02/2021   Myofascial pain dysfunction syndrome 08/02/2021   Adverse effect of prednisone 07/11/2021   Arteriovenous malformation  (AVM) 01/03/2021   History of pulmonary embolism 12/08/2020   Arthritis of knee 12/06/2020   Bilateral knee pain 09/20/2020   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   High risk medication use 09/20/2020   Extensor tenosynovitis of right wrist 09/15/2020   S/P reverse total shoulder arthroplasty, 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   Lung nodule 01/02/2020   Status post arthroscopy of left shoulder 01/16/2018   Estrogen deficiency 06/28/2017   Medial epicondylitis, left elbow 04/29/2017   S/P arthroscopy of left shoulder 02/25/2017   Impingement syndrome of left shoulder 01/30/2017   Trochanteric bursitis, right hip 04/10/2016   Hypertension 09/02/2015   Urge incontinence 07/27/2015   Obesity (BMI 30-39.9) 04/28/2015   Osteoarthritis of left knee 01/28/2015   Status post total left knee replacement 01/28/2015   Vitamin D deficiency 04/27/2014   Seasonal and perennial allergic rhinitis 06/17/2013   S/P total hip arthroplasty 03/20/2013   Medicare annual wellness visit, subsequent 11/18/2012   History of falling 11/18/2012   Routine general medical examination at a health care facility 11/10/2012   Lichen sclerosus et atrophicus of the vulva 07/29/2012   Osteopenia 10/19/2011   Hypothyroidism 10/19/2011   Hereditary and idiopathic peripheral neuropathy 08/05/2009   Low back pain 08/05/2009   HAND PAIN, BILATERAL 08/05/2009   TREMOR 03/09/2008   MENOPAUSAL SYNDROME 10/09/2007   DEPRESSION, MAJOR 05/20/2007   BIPOLAR AFFECTIVE DISORDER 05/20/2007   Generalized anxiety disorder 05/20/2007   PERSONALITY DISORDER 05/20/2007   ESOPHAGEAL SPASM 05/20/2007   Diaphragmatic hernia 05/20/2007   COPD mixed type (HCC) 03/27/2007   HYPERCHOLESTEROLEMIA, PURE 12/10/2006   SYMPTOM, SYNDROME, CHRONIC FATIGUE 12/10/2006    PCP: [name], MD   REFERRING PROVIDER: Fuller Plan, MD   REFERRING DIAG: R29.6 (ICD-10-CM) - Recurrent falls   Rationale for Evaluation and Treatment:  Rehabilitation  THERAPY DIAG:  Other abnormalities of gait and mobility  Repeated falls  Muscle weakness (generalized)  Other low back pain  ONSET DATE: 12/17/22, most recent fall   SUBJECTIVE:  SUBJECTIVE STATEMENT: Relays pain is about 6/10 today and has not really improved since starting PT so she will discharge today.   PERTINENT HISTORY:  PMH includes: amaurosis Fugax, anxiety, chronic fatigue syndrome, CKD, COPD, COVID, Emphysema, Fibromyalgia, IBS, Linchen sclerosus, Migraines, vertigo, dexa - osteopenia, s/p bil. TKA, s/p Lt TSA, spinal stimulator  PAIN:  Are you having pain? Yes: NPRS scale: 6/10 Pain location: low back Pain description: constant sharp pain Aggravating factors: sitting in one position, standing too long,  Relieving factors: stimulator  PRECAUTIONS:  Fall  RED FLAGS: None   WEIGHT BEARING RESTRICTIONS:  No  FALLS:  Has patient fallen in last 6 months? Yes. Number of falls 4  LIVING ENVIRONMENT:  Stairs: yes, has chair lift she prefers to use Lives with sister  OCCUPATION:  none  PLOF:  Needs RW for ambulation, can dress herself, has to sponge bath, can not stand long enough for cooking or housework.   PATIENT GOALS: reduce falls, strength legs, improve standing tolernace   NEXT MD VISIT: 04/01/23   OBJECTIVE:  Note: Objective measures were completed at Evaluation unless otherwise noted.  PATIENT SURVEYS:  Eval: FOTO 38% functional, goal is 44% 01/28/23: FOTO decreased to 34%  COGNITIVE STATUS: Within functional limits for tasks assessed    GAIT: Eval Distance walked: 20 feet Assistive device utilized: Walker - 4 wheeled, upright Level of assistance: SBA Comments: decreased walking tolerance due to pain and weakness   LOWER EXTREMITY MMT:     MMT tested in sitting Right eval Left eval Right/Left 01/16/23 Rt/Lt 01/28/23  Hip flexion 3 3 3+/3+ 4/4  Hip extension      Hip abduction 3 3 3+/3+ 4/4  Hip adduction      Hip internal rotation 3 3    Hip external rotation 3 3    Knee flexion 4 4 4/4 4+/4+  Knee extension 4 4 4/4 4+/4+  Ankle dorsiflexion 3 3    Ankle plantarflexion 3 4    Ankle inversion      Ankle eversion       (Blank rows = not tested)    FUNCTIONAL TESTS:  Eval TUG time with upright RW 30.75 sec Balance test:  MCTSIB  Feet apart eyes open 30 sec with supervision  Feet apart eyes closed 10 sec with CGA  Feet apart of foam eyes open 10 seconds  Feet apart on foam eyes closed 7 seconds  =57/120  Feet together eyes open 7 seconds   TUG time with upright RW 23.82 sec Balance test:  MCTSIB  Feet apart eyes open 30 sec with supervision  Feet apart eyes closed 30sec with CGA  Feet apart of foam eyes open 30 seconds  Feet apart on foam eyes closed 10 seconds  =100/120     TREATMENT:  DATE:   01/28/23 Nu step L5 UE/LE seat #10 for 15 min for functional endurance and ROM Walking in clinic with rollator 50 feet X 3 Updated measurements and functional tests, see above for details Balance testing, see above PT plan of care discussion and education   01/23/23 Nu step L5 UE/LE seat #10 for 15 min for functional endurance and ROM Walking in bars without UE support down forward and back backwards X 2 round trips Sidestepping in bars 2 round trips with UE support Seated lumbar flexion stretch with ball 5 sec X 10 Ambulation 50 feet X 2 as well as 100 feet X 1 max tolerated distance with rollator and supervision, spread out throughout session Seated LAQ 3# 3X10 bilat Standing feet together on airex pad 30 sec X 2 Leg press machine 62# 2X12 DL  16/10/96 Nu step L5 UE/LE  seat #10 for 15 min for functional endurance and ROM Walking in bars without UE support down forward and back backwards X 2 round trips Sidestepping in bars 2 round trips with UE support Ambulation 50 feet X 2 as well as 100 feet X 1 max tolerated distance with rollator and supervision, spread out throughout session Seated LAQ 3# 3X10 bilat Standing feet together on airex pad 30 sec X 2 Leg press machine 50# 2X10 DL     PATIENT EDUCATION:  Education details: PT plan of care, HEP Person educated: Patient and sister Education method: Explanation, Demonstration, Verbal cues, and Handouts Education comprehension: verbalized understanding and needs further education  HOME EXERCISE PROGRAM: Access Code: URL: [email or web address] Date: 12/26/2022 Prepared by: Ivery Quale  Exercises - Seated Long Arc Quad  - 2 x daily - 6 x weekly - 2-3 sets - 10 reps - Seated Straight Leg Raise with Quad Contraction  - 2 x daily - 6 x weekly - 3 sets - 10 reps - Standing Hip Abduction with Counter Support  - 2 x daily - 6 x weekly - 1-2 sets - 10 reps - alternating marching  - 2 x daily - 6 x weekly - 1-2 sets - 10 reps - Heel Toe Raises with Counter Support  - 2 x daily - 6 x weekly - 2 sets - 10 reps - Romberg Stance  - 2 x daily - 6 x weekly - 1 sets - 3 reps - 20-30 sec hold - Wide Stance with Head Rotations and Unilateral Counter Support  - 2 x daily - 6 x weekly - 1 sets - 10-15 reps - Wide Stance with Head Nods and Counter Support  - 2 x daily - 6 x weekly - 1 sets - 10-15 reps - Standing Romberg to 1/2 Tandem Stance  - 2 x daily - 6 x weekly - 3 sets - 3 reps - 10 sec hold   ASSESSMENT:  CLINICAL IMPRESSION: She has attended 8 PT visits and has not made any progress in terms of her pain. She has made some progress with leg strength, balance, and gait speed however this has not correlated to any improved pain with activity. At this point we will discharge her from PT and  she will continue with HEP and follow up with MD PRN for further pain management.    OBJECTIVE IMPAIRMENTS: Abnormal gait, cardiopulmonary status limiting activity, decreased activity tolerance, decreased balance, decreased coordination, decreased endurance, decreased mobility, difficulty walking, decreased ROM, decreased strength, and pain.   ACTIVITY LIMITATIONS: carrying, lifting, bending, sitting, standing, squatting, sleeping, stairs, transfers, bed mobility, bathing, toileting,  hygiene/grooming, and locomotion level  PARTICIPATION LIMITATIONS: meal prep, cleaning, laundry, shopping, and community activity  PERSONAL FACTORS: Fitness, Past/current experiences, and 3+ comorbidities:    are also affecting patient's functional outcome.   REHAB POTENTIAL: Fair    CLINICAL DECISION MAKING: Evolving/moderate complexity  EVALUATION COMPLEXITY: Moderate   GOALS: Goals reviewed with patient? Yes Short term PT Goals Target date: 01/23/2023   Pt will be I and compliant with HEP. Baseline:  Goal status: MET 01/28/23   Long term PT goals Target date:02/20/2023   Pt will improve MCTSIB balance test score to at least 80/120 to show reduced risk for falling Baseline: Goal status: MET 01/28/23 Pt will improve  hip/knee strength to at least 4/5 MMT to improve functional strength Baseline: Goal status: MET 01/28/23 Pt will improve FOTO to at least 44% functional to show improved function Baseline: Goal status: not met 01/28/23 Pt report no new falls since starting PT. Goal status: MET 01/28/23   PLAN:  PT FREQUENCY: 1-2x/week  PT DURATION: 8 weeks  PLANNED INTERVENTIONS: 97164- PT Re-evaluation, 97110-Therapeutic exercises, 97530- Therapeutic activity, 97112- Neuromuscular re-education, 97535- Self Care, 16109- Manual therapy, and 97116- Gait training.  PLAN FOR NEXT SESSION:  DC to independent program today.   April Manson, PT,DPT 01/28/2023, 1:08 PM

## 2023-01-31 ENCOUNTER — Encounter: Payer: Medicare Other | Admitting: Physical Therapy

## 2023-02-04 ENCOUNTER — Ambulatory Visit
Admission: RE | Admit: 2023-02-04 | Discharge: 2023-02-04 | Disposition: A | Discharge status: Home / Self Care | Payer: Medicare Other | Source: Ambulatory Visit | Attending: Family Medicine | Admitting: Family Medicine

## 2023-02-04 DIAGNOSIS — R921 Mammographic calcification found on diagnostic imaging of breast: Secondary | ICD-10-CM

## 2023-02-07 ENCOUNTER — Other Ambulatory Visit: Payer: Self-pay | Admitting: Pharmacist

## 2023-02-07 DIAGNOSIS — J449 Chronic obstructive pulmonary disease, unspecified: Secondary | ICD-10-CM

## 2023-02-07 NOTE — Progress Notes (Signed)
Called [employer] to follow up on the re-enrollment status for 2025 [facility] inhaler).   [employer] confirms that re-enrollment has been approved through 02/26/24.  No additional information is needed from the program at this time.  They do request a new prescription at this time for continued shipment of Breztri.   1 year Rx refill pended to PCP -- [facility] pharmacy  Loree Fee, PharmD Clinical Pharmacist [facility] [phone]

## 2023-02-08 ENCOUNTER — Encounter: Payer: Self-pay | Admitting: Family Medicine

## 2023-02-08 ENCOUNTER — Ambulatory Visit (INDEPENDENT_AMBULATORY_CARE_PROVIDER_SITE_OTHER): Payer: Medicare Other | Admitting: Family Medicine

## 2023-02-08 VITALS — BP 132/61 | HR 73 | Temp 97.9°F | Ht 67.0 in | Wt 224.1 lb

## 2023-02-08 DIAGNOSIS — J449 Chronic obstructive pulmonary disease, unspecified: Secondary | ICD-10-CM | POA: Diagnosis not present

## 2023-02-08 DIAGNOSIS — N904 Leukoplakia of vulva: Secondary | ICD-10-CM

## 2023-02-08 DIAGNOSIS — R531 Weakness: Secondary | ICD-10-CM | POA: Insufficient documentation

## 2023-02-08 DIAGNOSIS — Z7409 Other reduced mobility: Secondary | ICD-10-CM

## 2023-02-08 DIAGNOSIS — Z9181 History of falling: Secondary | ICD-10-CM

## 2023-02-08 DIAGNOSIS — M0600 Rheumatoid arthritis without rheumatoid factor, unspecified site: Secondary | ICD-10-CM

## 2023-02-08 MED ORDER — CLOBETASOL PROPIONATE 0.05 % EX CREA: TOPICAL_CREAM | CUTANEOUS | 0 refills | Status: DC

## 2023-02-08 NOTE — Progress Notes (Unsigned)
Subjective:    Patient ID: Lori Orozco, female    DOB: Jul 02, 1950, 72 y.o.   MRN: [id number]  HPI  Wt Readings from Last 3 Encounters:  02/08/23 224 lb 2 oz (101.7 kg)  12/24/22 221 lb (100.2 kg)  12/20/22 220 lb (99.8 kg)   35.10 kg/m  Vitals:   02/08/23 1059 02/08/23 1136  BP: (!) 146/82 132/61  Pulse: 73   Temp: 97.9 F (36.6 C)   SpO2: (!) 88%    Pt presents with c/o falls and mobility issues  Also Copd   Feels like she breathing just fine despite low 02    Sero neg RA  Per Recurrent falls   I think her peripheral neuropathy along with arthritis and some muscle weakness are causative.  Does not really describe significant vertigo or central nervous system symptoms. Referral to physical therapy for recurrent falls.  ast rheum visit  Also shortness of breath with exertion   In PT  Making a little progress  (leg strength and walking)  8 visits  Medicare would not pay for more sessions Was trying very hard   Can get from here to front door with walker  Then gets shortness of breath and needs to sit   Gets around house with walker   She sits in chair and watches TV Does some PT   Needs to get her right hand "fixed"  Waiting on call from her hand surgeon  Dr Anthony Sar - [facility]  Has bad arthritis  Was talking about replacement of some joints  Can only use 2 fingers of hand   Right handed   Lives with her sister  Depressing at times , though they do get along  Trying to get a new psychologist Wants to see her psychiatrist   Has a walker with a seat   She does not drive Uses senior wheels program     Copd Pt feels about the same  Next pulmonary appointment is oct 2025   Pulse ox 92% at home  Lower here Declines 02   Patient Active Problem List   Diagnosis Date Noted   Mobility impaired 02/10/2023   Generalized weakness 02/08/2023   Recurrent falls 12/24/2022   Pre-operative clearance 10/30/2022   Diarrhea  10/19/2022   Cold intolerance 10/19/2022   Osteoporosis 09/10/2022   Laceration of forehead 08/29/2022   Thoracic compression fracture (HCC) 07/13/2022   Diabetes mellitus screening 07/13/2022   Lumbar herniated disc    Chronic pain 11/28/2021   Bilateral leg weakness 10/09/2021   Vitamin B12 deficiency 08/02/2021   Myofascial pain dysfunction syndrome 08/02/2021   Adverse effect of prednisone 07/11/2021   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   History of pulmonary embolism 12/08/2020   Arthritis of knee 12/06/2020   Bilateral knee pain 09/20/2020   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   High risk medication use 09/20/2020   Extensor tenosynovitis of right wrist 09/15/2020   S/P reverse total shoulder arthroplasty, 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   Lung nodule 01/02/2020   Status post arthroscopy of left shoulder 01/16/2018   Estrogen deficiency 06/28/2017   Medial epicondylitis, left elbow 04/29/2017   S/P arthroscopy of left shoulder 02/25/2017   Impingement syndrome of left shoulder 01/30/2017   Trochanteric bursitis, right hip 04/10/2016   Hypertension 09/02/2015   Urge incontinence 07/27/2015   Obesity (BMI 30-39.9) 04/28/2015   Osteoarthritis of left knee 01/28/2015   Status post total left knee replacement 01/28/2015   Vitamin D deficiency 04/27/2014  Seasonal and perennial allergic rhinitis 06/17/2013   S/P total hip arthroplasty 03/20/2013   Medicare annual wellness visit, subsequent 11/18/2012   At high risk for falls 11/18/2012   Routine general medical examination at a health care facility 11/10/2012   Lichen sclerosus et atrophicus of the vulva 07/29/2012   Osteopenia 10/19/2011   Hypothyroidism 10/19/2011   Hereditary and idiopathic peripheral neuropathy 08/05/2009   Low back pain 08/05/2009   HAND PAIN, BILATERAL 08/05/2009   TREMOR 03/09/2008   MENOPAUSAL SYNDROME 10/09/2007   DEPRESSION, MAJOR 05/20/2007   BIPOLAR AFFECTIVE DISORDER 05/20/2007    Generalized anxiety disorder 05/20/2007   PERSONALITY DISORDER 05/20/2007   ESOPHAGEAL SPASM 05/20/2007   Diaphragmatic hernia 05/20/2007   COPD mixed type (HCC) 03/27/2007   HYPERCHOLESTEROLEMIA, PURE 12/10/2006   SYMPTOM, SYNDROME, CHRONIC FATIGUE 12/10/2006   Past Medical History:  Diagnosis Date   Allergy Hayfever   1958   Amaurosis fugax    Anemia    hx   Anxiety    Arthritis    Asthma    Cataract    Chronic fatigue syndrome    Chronic kidney disease    frequency, nephritis when 72 yrs old   COPD (chronic obstructive pulmonary disease) (HCC)    COVID-19    Depression    Diverticulitis    Emphysema of lung (HCC)    Fibromyalgia    GERD (gastroesophageal reflux disease)    occ   H/O hiatal hernia    History of bronchitis    Hyperlipidemia    Hypothyroidism    Interstitial cystitis    Irritable bowel syndrome    Lichen sclerosus    Lumbar herniated disc    Migraines    Motor nerve conduction block 11/2021   Osteoporosis 09/10/2022   Pneumonia    PONV (postoperative nausea and vomiting)    Shortness of breath    exertion    Thyroid disease    [place]   Urinary frequency    Urinary tract infection    Vertigo    Past Surgical History:  Procedure Laterality Date   ABDOMINAL HYSTERECTOMY     bladder sugery      CAROTID DOPPLAR     COLONOSCOPY  05/26/2010   avms- otherwise nl , re check 10y   DEXA-OSTEOPENIA     DOPPLER ECHOCARDIOGRAPHY     elbow surgery     EPICONDYLITIS     EYE SURGERY Bilateral    cataracts   FOOT SURGERY Bilateral    I & D EXTREMITY Right 09/01/2020   Procedure: TYNOSYNOVECTOMY;  Surgeon: [name], MD;  Location: [facility] OR;  Service: Orthopedics;  Laterality: Right;   IMPLANTATION VAGAL NERVE STIMULATOR  03/2022   JOINT REPLACEMENT     KNEE SURGERY     Left cartilage   lipoma in second finger right hand     MOUTH SURGERY  08/22/2022   PLANTAR FASCIA SURGERY Left    REVERSE SHOULDER ARTHROPLASTY Left 02/02/2020    Procedure: LEFT REVERSE SHOULDER ARTHROPLASTY;  Surgeon: [name], MD;  Location: [facility] OR;  Service: Orthopedics;  Laterality: Left;   ROTATOR CUFF REPAIR Left 12/2017   TEMPOROMANDIBULAR JOINT SURGERY     TONSILLECTOMY     TOTAL HIP ARTHROPLASTY Right 03/20/2013   Procedure: Right TOTAL HIP ARTHROPLASTY;  Surgeon: [name], MD;  Location: [facility] OR;  Service: Orthopedics;  Laterality: Right;  Right Total Hip Arthroplasty   TOTAL KNEE ARTHROPLASTY Left 01/28/2015   Procedure: LEFT TOTAL KNEE ARTHROPLASTY;  Surgeon: [name], MD;  Location: [facility];  Service: Orthopedics;  Laterality: Left;   TRIGGER FINGER RELEASE Right    TROCHANTERIC BURSA EXCISION Right 05/03/2016   Dr. [name], [name]   WRIST ARTHROSCOPY Right    ligament tear   Social History   Tobacco Use   Smoking status: Former    Current packs/day: 0.00    Average packs/day: 2.0 packs/day for 43.0 years (86.0 ttl pk-yrs)    Types: Cigarettes    Start date: 02/26/1970    Quit date: 02/26/2013    Years since quitting: 9.9    Passive exposure: Never   Smokeless tobacco: Never   Tobacco comments:    Vaping occasionally  Vaping Use   Vaping status: Some Days   Devices: uses about once per month  Substance Use Topics   Alcohol use: No   Drug use: No   Family History  Problem Relation Age of Onset   Arthritis Mother    Hypertension Mother    Kidney disease Mother    Heart disease Mother    Stroke Mother    Cancer Father        Bladder   Arthritis Sister    Heart disease Sister    Colon cancer Other    Cancer - Lung Cousin    Arthritis Maternal Grandmother    Hearing loss Maternal Grandfather    Varicose Veins Maternal Uncle    Allergies  Allergen Reactions   Lithium Anaphylaxis   Tegretol [Carbamazepine] Other (See Comments)    Fever and body aches (fever over 103)   Tricyclic Antidepressants Anaphylaxis    Other reaction(s): Unknown   Methotrexate Derivatives Other (See Comments)     Urinary retention and dizziness  Other reaction(s): Other UTI   Strawberry Extract Hives   Tapentadol Rash    PT ALLERGIC NYLON TAPE    Codeine Nausea Only    Makes pt stay awake   Cymbalta [Duloxetine Hcl] Other (See Comments)    Makes pt pass out    Erythromycin     abdominal pain   Lyrica [Pregabalin]     Felt faint   Neurontin [Gabapentin]     Passes  out   Rabeprazole Sodium     insomnia   Baclofen Nausea Only    Sever nausea   Duraprep [Antiseptic Products, Misc.] Itching and Rash    Tolerates Betadine    Penicillins Rash    Tolerated ANCEF 02/02/20  Has patient had a PCN reaction causing immediate rash, facial/tongue/throat swelling, SOB or lightheadedness with hypotension: Yes Has patient had a PCN reaction causing severe rash involving mucus membranes or skin necrosis: No Has patient had a PCN reaction that required hospitalization No Has patient had a PCN reaction occurring within the last 10 years: No If all of the above answers are "NO", then may proceed with Cephalosporin use.    Tape Rash    PT ALLERGIC NYLON TAPE    Current Outpatient Medications on File Prior to Visit  Medication Sig Dispense Refill   albuterol (VENTOLIN HFA) 108 (90 Base) MCG/ACT inhaler Inhale 1-2 puffs into the lungs every 4 (four) hours as needed for wheezing or shortness of breath. 1 each 0   alendronate (FOSAMAX) 70 MG tablet Take 1 tablet (70 mg total) by mouth once a week. Take with a full glass of water on an empty stomach. 12 tablet 0   Black Cohosh 40 MG CAPS Take 1 capsule (40 mg total) by mouth daily. 30  capsule 11   Budeson-Glycopyrrol-Formoterol (BREZTRI AEROSPHERE) 160-9-4.8 MCG/ACT AERO Inhale 2 puffs into the lungs 2 (two) times daily. 3 each 3   buPROPion (WELLBUTRIN XL) 150 MG 24 hr tablet Take 150 mg by mouth daily.     busPIRone (BUSPAR) 15 MG tablet Take 15 mg by mouth 2 (two) times daily before a meal.     cholecalciferol (VITAMIN D) 25 MCG (1000 UNIT) tablet Take  2,000 Units by mouth daily. 2 tablets daily     cyanocobalamin 2000 MCG tablet Take 500 mcg by mouth daily.     diclofenac Sodium (VOLTAREN) 1 % GEL Apply 2 g topically 2 (two) times daily as needed (pain).     diphenhydrAMINE (BENADRYL) 25 MG tablet Take 25 mg by mouth every 6 (six) hours as needed for allergies.     divalproex (DEPAKOTE ER) 500 MG 24 hr tablet Take 500 mg by mouth at bedtime.      etanercept (ENBREL SURECLICK) 50 MG/ML injection Inject 50 mg into the skin once a week. 12 mL 0   fluticasone (FLONASE) 50 MCG/ACT nasal spray PLACE 2 SPRAYS INTO THE NOSE DAILY. 16 g 11   HYDROcodone-acetaminophen (NORCO/VICODIN) 5-325 MG tablet Take 1 tablet by mouth 3 (three) times daily as needed for moderate pain. 30 tablet 0   ipratropium-albuterol (DUONEB) 0.5-2.5 (3) MG/3ML SOLN Take 3 mLs by nebulization every 6 (six) hours as needed. 120 mL prn   ketotifen (ZADITOR) 0.025 % ophthalmic solution Place 1 drop into both eyes 2 (two) times daily as needed (allergies).     levothyroxine (SYNTHROID) 50 MCG tablet Take 1 tablet (50 mcg total) by mouth daily before breakfast. 90 tablet 1   LORazepam (ATIVAN) 1 MG tablet Take 1 mg by mouth at bedtime as needed for anxiety or sleep.     metoprolol succinate (TOPROL-XL) 25 MG 24 hr tablet Take 0.5 tablets (12.5 mg total) by mouth daily. 90 tablet 1   mirabegron ER (MYRBETRIQ) 25 MG TB24 tablet Take 1 tablet (25 mg total) by mouth in the morning and at bedtime. 180 tablet 3   mirtazapine (REMERON) 30 MG tablet Take 30 mg by mouth at bedtime.      ondansetron (ZOFRAN-ODT) 4 MG disintegrating tablet Take 1 tablet (4 mg total) by mouth every 8 (eight) hours as needed for nausea or vomiting. 20 tablet 1   predniSONE (DELTASONE) 10 MG tablet Take 1 tablet (10 mg total) by mouth daily with breakfast. 90 tablet 0   Rhubarb (ESTROVEN COMPLETE) 4 MG TABS Take 4 mg by mouth daily. 30 tablet 11   sertraline (ZOLOFT) 100 MG tablet Take 100 mg by mouth daily.       simvastatin (ZOCOR) 20 MG tablet Take 1 tablet (20 mg total) by mouth at bedtime. 90 tablet 3   Spacer/Aero-Holding Chambers (AEROCHAMBER PLUS) inhaler Use with inhaler 1 each 2   No current facility-administered medications on file prior to visit.    Review of Systems  Constitutional:  Negative for activity change, appetite change, fatigue, fever and unexpected weight change.  HENT:  Negative for congestion, ear pain, rhinorrhea, sinus pressure and sore throat.   Eyes:  Negative for pain, redness and visual disturbance.  Respiratory:  Negative for cough, shortness of breath and wheezing.   Cardiovascular:  Negative for chest pain and palpitations.  Gastrointestinal:  Negative for abdominal pain, blood in stool, constipation and diarrhea.  Endocrine: Negative for polydipsia and polyuria.  Genitourinary:  Negative for dysuria, frequency and urgency.  Musculoskeletal:  Positive for arthralgias, back pain, gait problem, joint swelling and myalgias.  Skin:  Negative for pallor and rash.  Allergic/Immunologic: Negative for environmental allergies.  Neurological:  Negative for dizziness, syncope and headaches.  Hematological:  Negative for adenopathy. Does not bruise/bleed easily.  Psychiatric/Behavioral:  Negative for decreased concentration and dysphoric mood. The patient is not nervous/anxious.        Objective:   Physical Exam Constitutional:      General: She is not in acute distress.    Appearance: Normal appearance. She is well-developed. She is obese. She is not ill-appearing or diaphoretic.  HENT:     Head: Normocephalic and atraumatic.  Eyes:     Conjunctiva/sclera: Conjunctivae normal.     Pupils: Pupils are equal, round, and reactive to light.  Neck:     Thyroid: No thyromegaly.     Vascular: No carotid bruit or JVD.  Cardiovascular:     Rate and Rhythm: Normal rate and regular rhythm.     Heart sounds: Normal heart sounds.     No gallop.  Pulmonary:     Effort:  Pulmonary effort is normal. No respiratory distress.     Breath sounds: Normal breath sounds. No wheezing or rales.  Abdominal:     General: There is no distension or abdominal bruit.     Palpations: Abdomen is soft.  Musculoskeletal:     Cervical back: Normal range of motion and neck supple.     Right lower leg: No edema.     Left lower leg: No edema.     Comments: Mild kyphosis Limited rom of spine   Significant joint deformity of right hand with enl of MCP joints and limited rom of fingers  Knees/hands/wrists are tender     Lymphadenopathy:     Cervical: No cervical adenopathy.  Skin:    General: Skin is warm and dry.     Coloration: Skin is not pale.     Findings: No erythema or rash.  Neurological:     Mental Status: She is alert.     Cranial Nerves: No cranial nerve deficit.     Motor: Weakness present.     Coordination: Coordination normal.     Gait: Gait abnormal.     Deep Tendon Reflexes: Reflexes are normal and symmetric. Reflexes normal.     Comments: Poor balance   Pt can rise from seat with use of arms  Can get up and down hall with walker slowly  (with discomfort and shortness of breath)  Gait is unsteady, cannot take steps or stand w/o walker   Atrophy of muscles in right hand   Unable to stand still for rhomberg    Psychiatric:        Mood and Affect: Mood normal.           Assessment & Plan:   Problem List Items Addressed This Visit       Respiratory   COPD mixed type (HCC)   Pulse ox here today is 88-89% on room air in setting of copd (no acute flare) Pt notes it is higher at home and she does monitor  States she does not feel shortness of breath and declines 02 therapy at this time Encouraged follow up with pulmonary provider Discussed potential risks and effects of hypoxemia Lung exam is baseline       Relevant Orders   Ambulatory referral to Home Health     Musculoskeletal and Integument   Seronegative rheumatoid arthritis (HCC)  Ongoing  Right hand has significant deformity , having more problems using/gripping walker  Planning follow up with ortho - may consider some surgery in that hand  This adds to mobility issues       Relevant Orders   Ambulatory referral to Home Health     Genitourinary   Lichen sclerosus et atrophicus of the vulva   Refilled her clobetasol in interim while getting appointment with her gyn      Relevant Medications   clobetasol cream (TEMOVATE) 0.05 %     Other   Mobility impaired - Primary   Due to multiple thinks  RA with joint changes including issues using right hand  OA diffusely  Deg dz of spine  Poor balance  Generalized weakness Shortness of breath from copd   Today she is able to stand from chair unassisted and walk with regular walker to and from waiting room  If her hand function worsens - would require electric device for mobility  Will follow this  HH PT ordered       Relevant Orders   Ambulatory referral to Home Health   Generalized weakness   This adds to her mobility issues and fall risk Has done outpt PT , a little progress but still falling at home  Uses both a regular seated and standing walker   I would like her to have some HH PT to help assess home for safety       Relevant Orders   Ambulatory referral to Home Health   At high risk for falls   Multiple falls at home  Uses walker  RA, OA, poor balance, shortness of breath with exertion   HH PT ordered        Relevant Orders   Ambulatory referral to Home Health

## 2023-02-08 NOTE — Patient Instructions (Addendum)
Follow up with orthopedics about the hand   I will order home health - for balance and strength and home safety  I put the referral in  Please let us know if you don't hear in 1-2 weeks    I am concerned about your oxygen level  -so watch it at home and watch for shortness of breath    Follow up with gyn when able  I sent in your clobetasol to get by until your appointment

## 2023-02-10 DIAGNOSIS — Z7409 Other reduced mobility: Secondary | ICD-10-CM | POA: Insufficient documentation

## 2023-02-10 MED ORDER — BREZTRI AEROSPHERE 160-9-4.8 MCG/ACT IN AERO: 2.0000 | INHALATION_SPRAY | Freq: Two times a day (BID) | RESPIRATORY_TRACT | 4 refills | Status: DC

## 2023-02-10 NOTE — Assessment & Plan Note (Signed)
Due to multiple thinks  RA with joint changes including issues using right hand  OA diffusely  Deg dz of spine  Poor balance  Generalized weakness Shortness of breath from copd   Today she is able to stand from chair unassisted and walk with regular walker to and from waiting room  If her hand function worsens - would require electric device for mobility  Will follow this  HH PT ordered

## 2023-02-10 NOTE — Assessment & Plan Note (Signed)
Ongoing  Right hand has significant deformity , having more problems using/gripping walker  Planning follow up with ortho - may consider some surgery in that hand  This adds to mobility issues

## 2023-02-10 NOTE — Assessment & Plan Note (Signed)
This adds to her mobility issues and fall risk Has done outpt PT , a little progress but still falling at home  Uses both a regular seated and standing walker   I would like her to have some HH PT to help assess home for safety

## 2023-02-10 NOTE — Assessment & Plan Note (Signed)
Refilled her clobetasol in interim while getting appointment with her gyn

## 2023-02-10 NOTE — Assessment & Plan Note (Signed)
Pulse ox here today is 88-89% on room air in setting of copd (no acute flare) Pt notes it is higher at home and she does monitor  States she does not feel shortness of breath and declines 02 therapy at this time Encouraged follow up with pulmonary provider Discussed potential risks and effects of hypoxemia Lung exam is baseline

## 2023-02-10 NOTE — Assessment & Plan Note (Signed)
Multiple falls at home  Uses walker  RA, OA, poor balance, shortness of breath with exertion   HH PT ordered

## 2023-02-11 ENCOUNTER — Other Ambulatory Visit: Payer: Self-pay | Admitting: Internal Medicine

## 2023-02-11 DIAGNOSIS — M8000XS Age-related osteoporosis with current pathological fracture, unspecified site, sequela: Secondary | ICD-10-CM

## 2023-02-11 DIAGNOSIS — Z79899 Other long term (current) drug therapy: Secondary | ICD-10-CM

## 2023-02-11 NOTE — Telephone Encounter (Signed)
Last Fill: 12/03/2022  Labs: 12/24/2022 MCV slightly elevated at 100.7 probably not significant.  Kidney function is stable estimated GFR 51.  CK of 16 is low.  Definitely not consistent with muscle inflammation.  Probably more related to deconditioning and loss of muscle mass.  Sedimentation rate 25 is slightly above normal.  Okay to continue the current treatment plan.   07/13/2022 Vitamin D 37.38  08/08/2022 Bone Density  Next Visit: 04/01/2023  Last Visit: 12/24/2022  DX: Osteoporosis with current pathological fracture, unspecified osteoporosis type, sequela   Current Dose per office note 12/24/2022: not mentioned  Contacted the patient to advise she is due for a Vitamin D lab. Patient states she can come in sometime this week to have that lab drawn. Please review and sign lab.   Okay to refill Fosamax?

## 2023-02-12 ENCOUNTER — Other Ambulatory Visit: Payer: Self-pay | Admitting: *Deleted

## 2023-02-12 DIAGNOSIS — Z79899 Other long term (current) drug therapy: Secondary | ICD-10-CM

## 2023-02-12 DIAGNOSIS — M8000XS Age-related osteoporosis with current pathological fracture, unspecified site, sequela: Secondary | ICD-10-CM

## 2023-02-12 MED ORDER — METOPROLOL SUCCINATE ER 25 MG PO TB24: 12.5000 mg | ORAL_TABLET | Freq: Every day | ORAL | 2 refills | Status: DC

## 2023-02-13 LAB — VITAMIN D 25 HYDROXY (VIT D DEFICIENCY, FRACTURES): Vit D, 25-Hydroxy: 42 ng/mL (ref 30–100)

## 2023-02-18 NOTE — Addendum Note (Signed)
Addended by: Roxy Manns A on: 02/18/2023 01:42 PM   Modules accepted: Orders

## 2023-03-01 ENCOUNTER — Ambulatory Visit: Payer: Self-pay

## 2023-03-01 NOTE — Patient Outreach (Signed)
  Care Coordination   Follow Up Visit Note   03/01/2023 Name: Lori Orozco MRN: [id number] DOB: [birth year]  Lori Orozco is a 73 y.o. year old female who sees Tower, Laine LABOR, MD for primary care. I spoke with  [name] by phone today.  What matters to the patients health and wellness today?  Patient states her physical therapy from her rheumatologist has ended and now she will have follow up with [facility] home health ordered by her primary care provider.   Patient states she had her initial assessment done with [facility]  on 02/22/23. She states she has not heard when services officially start.  Patient states she is waiting to hear from provider office regarding surgery date for right hand.  Patient denies having any falls since last outreach with RNCM.     Goals Addressed             This Visit's Progress    assistance with community resources and management of chronic health conditions       Interventions Today    Flowsheet Row Most Recent Value  Chronic Disease   Chronic disease during today's visit Other  [RA, chronic back pain / leg pain / falls/ right hand arthritis]  General Interventions   General Interventions Discussed/Reviewed General Interventions Reviewed, Doctor Visits  [evaluation of current treatment plan for RA, back/ leg pain/ falls and patients adherence to plan as established by provider. Assessed for increase in RA symptoms/ back /leg pain.]  Doctor Visits Discussed/Reviewed Doctor Visits Reviewed  bethann upcoming provider visits.  Advised to keep follow up visits with providers as recommended. Reviewed primary care visit from 02/08/23]  Education Interventions   Education Provided Provided Education  [facility] for patient to determine start of service date for therapy.  Confirmed patient has contact phone number for RNCM to call if concerns/ questions regarding her home health services.]  Provided Verbal Education On  Other  [Inquired if surgery date scheduled for right hand.]  Pharmacy Interventions   Pharmacy Dicussed/Reviewed Pharmacy Topics Reviewed  [medications reviewed and compliance discussed.  Advised to take medications as prescribed.]  Safety Interventions   Safety Discussed/Reviewed Fall Risk  [Assessed for falls. Encouraged ongoing use of ambulatory devices.]                SDOH assessments and interventions completed:  No     Care Coordination Interventions:  Yes, provided   Follow up plan: Follow up call scheduled for 04/04/23    Encounter Outcome:  Patient Visit Completed   Julianna Vanwagner RN,BSN,CCM [facility]  [facility] coordinator / Case Manager Phone: [phone]

## 2023-03-01 NOTE — Patient Instructions (Signed)
 Visit Information  Thank you for taking time to visit with me today. Please don't hesitate to contact me if I can be of assistance to you.   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 Assessed for increase in RA symptoms/ back /leg pain.]  Doctor Visits Discussed/Reviewed Doctor Visits Reviewed  bethann upcoming provider visits.  Advised to keep follow up visits with providers as recommended. Reviewed primary care visit from 02/08/23]  Education Interventions   Education Provided Provided Education  [facility] for patient to determine start of service date for therapy.  Confirmed patient has contact phone number for RNCM to call if concerns/ questions regarding her home health services.]  Provided Verbal Education On Other  [Inquired if surgery date scheduled for right hand.]  Pharmacy Interventions   Pharmacy Dicussed/Reviewed Pharmacy Topics Reviewed  [medications reviewed and compliance discussed.  Advised to take medications as prescribed.]  Safety Interventions   Safety Discussed/Reviewed Fall Risk  [Assessed for falls. Encouraged ongoing use of ambulatory devices.]                Our next appointment is by telephone on 04/04/23 at 3 pm  Please call the care guide team at [phone] if you need to cancel or reschedule your appointment.   If you are experiencing a Mental Health or Behavioral Health Crisis  or need someone to talk to, please call the Suicide and Crisis Lifeline: 988 call 1-800-273-TALK (toll free, 24 hour hotline)  Patient verbalizes understanding of instructions and care plan provided today and agrees to view in MyChart. Active MyChart status and patient understanding of how to access instructions and care plan via MyChart confirmed with patient.     Arvin Seip RN,BSN,CCM [place]  [facility], [facility] coordinator / Case Manager Phone: [phone]

## 2023-03-04 DIAGNOSIS — N189 Chronic kidney disease, unspecified: Secondary | ICD-10-CM | POA: Diagnosis not present

## 2023-03-04 DIAGNOSIS — M7062 Trochanteric bursitis, left hip: Secondary | ICD-10-CM

## 2023-03-04 DIAGNOSIS — G609 Hereditary and idiopathic neuropathy, unspecified: Secondary | ICD-10-CM

## 2023-03-04 DIAGNOSIS — J439 Emphysema, unspecified: Secondary | ICD-10-CM | POA: Diagnosis not present

## 2023-03-04 DIAGNOSIS — D649 Anemia, unspecified: Secondary | ICD-10-CM

## 2023-03-04 DIAGNOSIS — M5126 Other intervertebral disc displacement, lumbar region: Secondary | ICD-10-CM

## 2023-03-04 DIAGNOSIS — M7918 Myalgia, other site: Secondary | ICD-10-CM

## 2023-03-04 DIAGNOSIS — M0600 Rheumatoid arthritis without rheumatoid factor, unspecified site: Secondary | ICD-10-CM

## 2023-03-04 DIAGNOSIS — M810 Age-related osteoporosis without current pathological fracture: Secondary | ICD-10-CM

## 2023-03-04 DIAGNOSIS — J4489 Other specified chronic obstructive pulmonary disease: Secondary | ICD-10-CM | POA: Diagnosis not present

## 2023-03-04 DIAGNOSIS — I129 Hypertensive chronic kidney disease with stage 1 through stage 4 chronic kidney disease, or unspecified chronic kidney disease: Secondary | ICD-10-CM | POA: Diagnosis not present

## 2023-03-04 DIAGNOSIS — M1990 Unspecified osteoarthritis, unspecified site: Secondary | ICD-10-CM

## 2023-03-05 ENCOUNTER — Telehealth: Payer: Self-pay | Admitting: Family Medicine

## 2023-03-05 DIAGNOSIS — H6123 Impacted cerumen, bilateral: Secondary | ICD-10-CM

## 2023-03-05 DIAGNOSIS — K1379 Other lesions of oral mucosa: Secondary | ICD-10-CM

## 2023-03-05 NOTE — Telephone Encounter (Signed)
 What diagnosis /problem does she want me to use for the referral? Thanks

## 2023-03-05 NOTE — Telephone Encounter (Signed)
 Copied from CRM [id number]. Topic: Referral - Request for Referral >> Mar 05, 2023  2:17 PM Viola F wrote: Did the patient discuss referral with their provider in the last year? No (If No - schedule appointment) (If Yes - send message)  Appointment offered? Yes, but patient already has appt for May 2025  Type of order/referral and detailed reason for visit: Ear, Nose, Throat doctor   Preference of office, provider, location: Dr.Sun Teo? on [address]?  If referral order, have you been seen by this specialty before? No (If Yes, this issue or another issue? When? Where?  Can we respond through MyChart? Yes

## 2023-03-05 NOTE — Telephone Encounter (Signed)
 Called [employer] to seek status of application. Per representative, they did receive our PAP application fax documents in December 2024, however a case for this application had not been started on their end. The representative has submitted our documents to a team lead to start a case for 2025. This was documented on their end as well.   Tolu Tamario Heal, PharmD [facility] PGY-1

## 2023-03-06 DIAGNOSIS — H6120 Impacted cerumen, unspecified ear: Secondary | ICD-10-CM | POA: Insufficient documentation

## 2023-03-06 DIAGNOSIS — K1379 Other lesions of oral mucosa: Secondary | ICD-10-CM | POA: Insufficient documentation

## 2023-03-06 NOTE — Telephone Encounter (Signed)
 Pt see Dr. 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 Pt said due to her being in a assisted living facility she doesn't always have reliable transportation. Pt called [facility] in [place] to try and get established with them and they told her she needs a referral.   I did print out last note from ENT (in epic already) for PCP to review

## 2023-03-06 NOTE — Telephone Encounter (Signed)
 I put the referral in  Please let us know if you don't hear in 1-2 weeks  Thanks

## 2023-03-06 NOTE — Addendum Note (Signed)
 Addended by: Roxy Manns A on: 03/06/2023 08:43 PM   Modules accepted: Orders

## 2023-03-07 NOTE — Telephone Encounter (Signed)
 Left VM letting pt know referral placed and advised of PCP's comments

## 2023-03-11 ENCOUNTER — Encounter (INDEPENDENT_AMBULATORY_CARE_PROVIDER_SITE_OTHER): Payer: Self-pay | Admitting: Otolaryngology

## 2023-03-15 ENCOUNTER — Encounter: Payer: Self-pay | Admitting: Pharmacist

## 2023-03-15 ENCOUNTER — Telehealth: Payer: Self-pay | Admitting: Family Medicine

## 2023-03-15 NOTE — Telephone Encounter (Signed)
VO given to [place]  ?

## 2023-03-15 NOTE — Progress Notes (Unsigned)
Patient called requesting call back to discuss her medication insurance changes in 2025.   Reports she received a letter that Myrbetriq will no longer be covered in 2025.  No Part D insurance card showing in Media tab.   Insurance Card (Per patient) [employer]  BIN [phone] PCN: [id number] Group: [id number] ID: [id number]   Based on [employer] website for this plan, looks like Myrbetriq is covered, though the copay is ~$150/month.

## 2023-03-15 NOTE — Telephone Encounter (Signed)
Please ok those verbal orders  

## 2023-03-15 NOTE — Telephone Encounter (Signed)
Copied from CRM [id number]. Topic: Clinical - Home Health Verbal Orders >> Mar 15, 2023  2:07 PM Ernst Spell wrote: Caller/Agency: Melissa with [facility] Number: [phone] Service Requested: Occupational Therapy Frequency: "Continue OT to next medicare week" Any new concerns about the patient? No

## 2023-03-18 ENCOUNTER — Encounter: Payer: Medicare Other | Admitting: Orthopedic Surgery

## 2023-03-19 ENCOUNTER — Other Ambulatory Visit: Payer: Self-pay | Admitting: Pharmacist

## 2023-03-19 ENCOUNTER — Other Ambulatory Visit (HOSPITAL_COMMUNITY): Payer: Self-pay

## 2023-03-19 DIAGNOSIS — Z79899 Other long term (current) drug therapy: Secondary | ICD-10-CM

## 2023-03-19 DIAGNOSIS — M0600 Rheumatoid arthritis without rheumatoid factor, unspecified site: Secondary | ICD-10-CM

## 2023-03-19 MED ORDER — ENBREL SURECLICK 50 MG/ML ~~LOC~~ SOAJ: 50.0000 mg | SUBCUTANEOUS | 0 refills | Status: DC

## 2023-03-19 NOTE — Telephone Encounter (Signed)
Spoke with [employer] about status of application. Per representative, patient requires an updated prior authorization. A test claim was ran for Enbrel and revealed that patient is now enrolled in Medicare Extra Help. Co-pay was ~$12. She will likely not need PAP at this time. ATC to call patient to discuss this. Unable to reach her. LVM.    Sofie Rower, PharmD [facility] PGY-1

## 2023-03-19 NOTE — Telephone Encounter (Signed)
Submitted an URGENT Prior Authorization RENEWAL request to [facility] for ENBREL via CoverMyMeds. Will update once we receive a response.  Key: [id number]   Per automated response: The patient currently has access to the requested medication and a Prior Authorization is not needed for the patient/medication.  Test claim shows copay is $12.15 so patient must have SS LIS subsidy now.  Chesley Mires, PharmD, MPH, BCPS, CPP Clinical Pharmacist (Rheumatology and Pulmonology)

## 2023-03-19 NOTE — Telephone Encounter (Signed)
Patient returned call. She states she has at least 1 full box of Enbrel at home ,possible two boxes. Will defer on side of caution and push onboarding to one month  Rx sent to [facility] for onboarding  Chesley Mires, PharmD, MPH, BCPS, CPP Clinical Pharmacist (Rheumatology and Pulmonology)

## 2023-03-20 NOTE — Progress Notes (Deleted)
 Office Visit Note  Patient: Lori Orozco             Date of Birth: [birth year]           MRN: [id number]             PCP: [name], MD Referring: Tower, Audrie Gallus, MD Visit Date: 04/01/2023   Subjective:  No chief complaint on file.   History of Present Illness: Lori Orozco is a 73 y.o. 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 She also started on Fosamax 70 mg once weekly for osteoporosis without any trouble taking the medication.    Previous HPI 12/24/2022 Lori Orozco is a 73 y.o. 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  She also started on Fosamax 70 mg once weekly for osteoporosis without any trouble taking the medication.  She had 1 episode of increased stiffness and swelling throughout proximal joints in her right hand this lasted for about 3 days.  She did not change her medications or take anything different to resolve this.  Saw Dr. Frazier Butt to discuss options to improve use of her right hand is waiting to hear back on recommendation or other options.  She just had a fall fell forward in the bathroom striking on her left shoulder and left side of the neck and base of skull.  Before this her last major fall was late last year has had about 2 or 3 other falls in total with suddenly losing balance.   Previous HPI 09/10/22 Lori Orozco is a 73 y.o. female here for follow up for seronegative RA on Enbrel 50 mg [place] weekly and prednisone 10 mg daily. She has significant pain in her back and legs interval workup indicating nerve impingement and is scheduled for injections with Dr. Alvester Morin for this. MRI also with vertebral fractures probably chronic. In her arms right wrist remains painful and decreased mobility worst on the ulnar side.   Previous HPI 05/30/22 Lori Orozco is a 73 y.o. female here for follow up for seronegative RA  on Enbrel 50 mg subcu weekly sulfasalazine 1000 mg twice daily and prednisone 20 mg daily.  She was taking the medications without interruptions but started experiencing increased joint pain swelling and stiffness especially affecting her hands wrists and elbows since about 2 weeks ago.  She called about this and we increased her to 20 mg prednisone daily up from 10 mg and she has noticed improvement in symptoms especially her elbows.  No longer swollen and is able to tolerate resting her arms on a flat surface without severe pain.  She did suffer from upper respiratory symptoms last week but these have mostly resolved without any new medication needed.    Previous HPI 02/28/22 Lori Orozco is a 73 y.o. female here for follow up for seronegative rheumatoid arthritis on Enbrel 50 mg subcu weekly and prednisone 10 mg daily.  She had to stop taking the sulfasalazine since about 2 weeks ago due to running out of the medication.  She had change with insurance status so having a dramatically increased cost for her medications at the moment that she is having to sort out.  She was tolerating the medicine okay.  She did not recall a noticeable difference in symptoms with starting the sulfasalazine but while off the medication has felt noticeably  worse so thinks it was doing something.  Particularly the left wrist pain has been a lot better on treatment her right wrist continues to be bad before and after.  She saw Dr. Wynetta Emery for her neck recommendation was for physical therapy but she has not pursued this since and does not feel like she would tolerate this well based on previous therapy experiences.  She does have plans for trying the nerve stimulator treatment starting later this month.      Previous HPI 12/21/21 Lori Orozco is a 73 y.o. 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  She continues to have pain and stiffness affecting her hands  bilaterally although the severity of swelling has gotten notably better on the Enbrel treatment but is far from resolved.  Her biggest concern at the moment is about her neck pain concern of cervical radiculopathy.  We checked x-ray that showed considerable multilevel degenerative disease with no acute appearing bony abnormality.  She is followed up with her spine doctor with epidural block injection tried did not find it very beneficial at all.  She has discussed radiofrequency ablation as a neck step option for radicular pain.     Previous HPI 10/09/2021 Lori Orozco is a 73 y.o. female here for follow up for seronegative inflammatory arthritis on Enbrel 50 mg [place] weekly and prednisone 10 mg daily. She tried tapering prednisone but had severe worsening symptoms at 5 mg dose. She increased back to 10 mg daily and symptoms improved somewhat but still a lot of pain and swelling in both hands and wrists. She has ongoing back pain and stiffness and is seeing D.r Wynetta Emery for this with known significant lumbar spine arthritis. More recently new problem with sensitivity and stinging type pain throughout both arms with even light pressure such as showering water. She also has worsening weakness in her proximal legs unable to stand without using arms, which are limited by pain.     Previous HPI 07/05/2021 Lori Orozco is a 73 y.o. female here for follow up for seronegative inflammatory arthritis on prednisone 20 mg daily dose and enbrel 50mg  subcutaneous once weekly. She has seen a large improvement in joint inflammation since starting the Enbrel. She has pain and redness around the injection site lasting up to 3 days with each dose. Wrists continue to hurt and have decreased movement left wrist actually more symptomatic than right now. She is working with physical therapy on getting back to walking currently very weak and unstable due to illness and deconditioning. She had recent ED visit for COPD  exacerbation finished antibiotics for this. No other recent infection issues.   Previous HPI 02/08/2021 Lori Orozco is a 73 y.o. 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  She has had major medical events since her last visit.  She developed increased knee pain and swelling on the right side.  However she went to the hospital due to developing more weakness and shortness of breath CT angiogram identified bilateral pulmonary emboli.  Treatment with anticoagulation led to significant gastrointestinal bleeding from previously unknown AVM.  This was treated also in the hospital but overall protracted complications lasted for about a month hospitalization.  During this time she experienced worsening joint pain and swelling and significant deconditioning and weakness.  She was discharged from the hospital to rehab facility she was not continued on any steroid or  other anti-inflammatory treatment reports joint pain and swelling affecting numerous joints in bilateral elbows wrists fingers and knees.  This is limited any significant progress with physical therapy so far.  She called back to clinic earlier this month with the symptoms and was restarted on a prednisone taper currently down to 15 mg daily.  She saw orthopedics clinic for right knee aspiration injection 2 days ago that is partially helping.   Previous HPI 11/15/20 SHEMICKA ANGERMAN is a 73 y.o. female here for follow up with wrist wrist extensor tenosynovitis and presumed seronegative inflammatory arthritis also involving knee joints after starting methotrexate 15 mg PO weekly and tapering prednisone off. Her wrist is slightly improved but remains very painful and swollen with decreased mobility. After starting methotrexate she has noticed some episodes of dizziness and urinary retention and frequency. She stopped the prednisone without noticing much difference in symptoms so far.      09/20/20 CHELLIE PERILLI is a 73 y.o. female here for joint pain and swelling of knees and wrists with severe tenosynovitis s/p tenosynovectomy on 09/01/20 with Dr. [name].  She had been feeling in her usual health until June she recalls onset of symptoms after using a gas leaf blower at her home during which time she experienced some mild right wrist pain.  She does not recall any particular injury event at this time.  However she experienced progressive ongoing increase in pain with swelling and stiffness in her right wrist especially with decreased range of motion and pain over the dorsal aspect.  This is evaluated at orthopedics clinic with aspiration that revealed just coagulated sample trial of steroid medication and only partially improved symptoms then continued worsening.  She was admitted to the hospital for tenosynovectomy that was performed without complication.  Intraoperatively reported extensive inflammatory tissue debridement without any evidence concerning for infection.  During this hospitalization she also developed knee effusion which was aspirated demonstrating inflammatory synovial fluid with negative microscopy and cultures.  Is now almost 3 weeks since her surgery she continues experiencing severe pain intensity swelling around the right hand and wrist and has developed some skin blistering.     Labs reviewed 08/2020 Synovial fluid knee 4,525 WBCs 90% neutrophils ANA neg RF 14.2 CCP neg ESR 37 CRP 14.1 Uric acid 4.9   07/2020 Synovial fluid wrist clotted sample negative gram stain   No Rheumatology ROS completed.   PMFS History:  Patient Active Problem List   Diagnosis Date Noted   Cerumen impaction 03/06/2023   Lesion of palate 03/06/2023   Mobility impaired 02/10/2023   Generalized weakness 02/08/2023   Recurrent falls 12/24/2022   Pre-operative clearance 10/30/2022   Diarrhea 10/19/2022   Cold intolerance 10/19/2022   Osteoporosis 09/10/2022    Laceration of forehead 08/29/2022   Thoracic compression fracture (HCC) 07/13/2022   Diabetes mellitus screening 07/13/2022   Lumbar herniated disc    Chronic pain 11/28/2021   Bilateral leg weakness 10/09/2021   Vitamin B12 deficiency 08/02/2021   Myofascial pain dysfunction syndrome 08/02/2021   Adverse effect of prednisone 07/11/2021   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   History of pulmonary embolism 12/08/2020   Arthritis of knee 12/06/2020   Bilateral knee pain 09/20/2020   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   High risk medication use 09/20/2020   Extensor tenosynovitis of right wrist 09/15/2020   S/P reverse total shoulder arthroplasty, 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   Lung nodule 01/02/2020   Status post arthroscopy of left shoulder 01/16/2018   Estrogen deficiency  06/28/2017   Medial epicondylitis, left elbow 04/29/2017   S/P arthroscopy of left shoulder 02/25/2017   Impingement syndrome of left shoulder 01/30/2017   Trochanteric bursitis, right hip 04/10/2016   Hypertension 09/02/2015   Urge incontinence 07/27/2015   Obesity (BMI 30-39.9) 04/28/2015   Osteoarthritis of left knee 01/28/2015   Status post total left knee replacement 01/28/2015   Vitamin D deficiency 04/27/2014   Seasonal and perennial allergic rhinitis 06/17/2013   S/P total hip arthroplasty 03/20/2013   Medicare annual wellness visit, subsequent 11/18/2012   At high risk for falls 11/18/2012   Routine general medical examination at a health care facility 11/10/2012   Lichen sclerosus et atrophicus of the vulva 07/29/2012   Osteopenia 10/19/2011   Hypothyroidism 10/19/2011   Hereditary and idiopathic peripheral neuropathy 08/05/2009   Low back pain 08/05/2009   HAND PAIN, BILATERAL 08/05/2009   TREMOR 03/09/2008   MENOPAUSAL SYNDROME 10/09/2007   DEPRESSION, MAJOR 05/20/2007   BIPOLAR AFFECTIVE DISORDER 05/20/2007   Generalized anxiety disorder 05/20/2007   PERSONALITY DISORDER  05/20/2007   ESOPHAGEAL SPASM 05/20/2007   Diaphragmatic hernia 05/20/2007   COPD mixed type (HCC) 03/27/2007   HYPERCHOLESTEROLEMIA, PURE 12/10/2006   SYMPTOM, SYNDROME, CHRONIC FATIGUE 12/10/2006    Past Medical History:  Diagnosis Date   Allergy Hayfever   1958   Amaurosis fugax    Anemia    hx   Anxiety    Arthritis    Asthma    Cataract    Chronic fatigue syndrome    Chronic kidney disease    frequency, nephritis when 72 yrs old   COPD (chronic obstructive pulmonary disease) (HCC)    COVID-19    Depression    Diverticulitis    Emphysema of lung (HCC)    Fibromyalgia    GERD (gastroesophageal reflux disease)    occ   H/O hiatal hernia    History of bronchitis    Hyperlipidemia    Hypothyroidism    Interstitial cystitis    Irritable bowel syndrome    Lichen sclerosus    Lumbar herniated disc    Migraines    Motor nerve conduction block 11/2021   Osteoporosis 09/10/2022   Pneumonia    PONV (postoperative nausea and vomiting)    Shortness of breath    exertion    Thyroid disease    [place]   Urinary frequency    Urinary tract infection    Vertigo     Family History  Problem Relation Age of Onset   Arthritis Mother    Hypertension Mother    Kidney disease Mother    Heart disease Mother    Stroke Mother    Cancer Father        Bladder   Arthritis Sister    Heart disease Sister    Colon cancer Other    Cancer - Lung Cousin    Arthritis Maternal Grandmother    Hearing loss Maternal Grandfather    Varicose Veins Maternal Uncle    Past Surgical History:  Procedure Laterality Date   ABDOMINAL HYSTERECTOMY     bladder sugery      CAROTID DOPPLAR     COLONOSCOPY  05/26/2010   avms- otherwise nl , re check 10y   DEXA-OSTEOPENIA     DOPPLER ECHOCARDIOGRAPHY     elbow surgery     EPICONDYLITIS     EYE SURGERY Bilateral    cataracts   FOOT SURGERY Bilateral    I & D EXTREMITY Right 09/01/2020  Procedure: TYNOSYNOVECTOMY;  Surgeon: [name], MD;  Location: [facility] OR;  Service: Orthopedics;  Laterality: Right;   IMPLANTATION VAGAL NERVE STIMULATOR  03/2022   JOINT REPLACEMENT     KNEE SURGERY     Left cartilage   lipoma in second finger right hand     MOUTH SURGERY  08/22/2022   PLANTAR FASCIA SURGERY Left    REVERSE SHOULDER ARTHROPLASTY Left 02/02/2020   Procedure: LEFT REVERSE SHOULDER ARTHROPLASTY;  Surgeon: [name], MD;  Location[facility] OR;  Service: Orthopedics;  Laterality: Left;   ROTATOR CUFF REPAIR Left 12/2017   TEMPOROMANDIBULAR JOINT SURGERY     TONSILLECTOMY     TOTAL HIP ARTHROPLASTY Right 03/20/2013   Procedure: Right TOTAL HIP ARTHROPLASTY;  Surgeon: [name], MD;  Location: [facility] OR;  Service: Orthopedics;  Laterality: Right;  Right Total Hip Arthroplasty   TOTAL KNEE ARTHROPLASTY Left 01/28/2015   Procedure: LEFT TOTAL KNEE ARTHROPLASTY;  Surgeon: [name], MD;  Location: [facility] ORS;  Service: Orthopedics;  Laterality: Left;   TRIGGER FINGER RELEASE Right    TROCHANTERIC BURSA EXCISION Right 05/03/2016   Dr. Doneen Poisson   WRIST ARTHROSCOPY Right    ligament tear   Social History   Social History Narrative   Not on file   Immunization History  Administered Date(s) Administered   Influenza Split 11/19/2011, 11/02/2013   Influenza Whole 12/20/2006, 11/25/2008, 11/26/2009, 11/24/2010   Influenza, High Dose Seasonal PF 12/13/2016, 10/30/2022   Influenza,inj,Quad PF,6+ Mos 12/06/2015, 11/28/2021   Influenza-Unspecified 12/08/2012, 12/03/2014, 12/08/2017, 11/10/2018, 10/31/2019   Moderna Sars-Covid-2 Vaccination 06/21/2019, 07/19/2019, 01/22/2020   PFIZER(Purple Top)SARS-COV-2 Vaccination 06/04/2020, 10/30/2022   Pfizer Covid-19 Vaccine Bivalent Booster 55yrs & up 12/29/2021   Pneumococcal Conjugate-13 12/06/2015   Pneumococcal Polysaccharide-23 01/26/2002, 03/23/2008, 09/07/2010, 06/28/2017   Respiratory Syncytial Virus Vaccine,Recomb Aduvanted(Arexvy)  12/29/2021   Td 06/26/2000, 10/19/2011, 02/14/2022   Zoster Recombinant(Shingrix) 09/16/2017, 08/11/2022     Objective: Vital Signs: There were no vitals taken for this visit.   Physical Exam   Musculoskeletal Exam: ***  CDAI Exam: CDAI Score: -- Patient Global: --; Provider Global: -- Swollen: --; Tender: -- Joint Exam 04/01/2023   No joint exam has been documented for this visit   There is currently no information documented on the homunculus. Go to the Rheumatology activity and complete the homunculus joint exam.  Investigation: No additional findings.  Imaging: No results found.  Recent Labs: Lab Results  Component Value Date   WBC 10.4 12/24/2022   HGB 12.9 12/24/2022   PLT 226 12/24/2022   NA 141 12/24/2022   K 4.4 12/24/2022   CL 100 12/24/2022   CO2 31 12/24/2022   GLUCOSE 87 12/24/2022   BUN 21 12/24/2022   CREATININE 1.14 (H) 12/24/2022   BILITOT 0.4 12/24/2022   ALKPHOS 82 07/13/2022   AST 15 12/24/2022   ALT 10 12/24/2022   PROT 6.7 12/24/2022   ALBUMIN 3.8 07/13/2022   CALCIUM 9.5 12/24/2022   GFRAA >60 01/29/2015   QFTBGOLDPLUS NEGATIVE 11/07/2022    Speciality Comments: No specialty comments available.  Procedures:  No procedures performed Allergies: Lithium; Tegretol [carbamazepine]; Tricyclic antidepressants; Methotrexate derivatives; Strawberry extract; Tapentadol; Codeine; Cymbalta [duloxetine hcl]; Erythromycin; Lyrica [pregabalin]; Neurontin [gabapentin]; Rabeprazole sodium; Baclofen; Duraprep [antiseptic products, misc.]; Penicillins; and Tape   Assessment / Plan:     Visit Diagnoses: No diagnosis found.  ***  Orders: No orders of the defined types were placed in this encounter.  No orders of the defined  types were placed in this encounter.    Follow-Up Instructions: No follow-ups on file.   Metta Clines, RT  Note - This record has been created using [employer].  Chart creation errors have been sought, but may  not always  have been located. Such creation errors do not reflect on  the standard of medical care.

## 2023-03-21 ENCOUNTER — Encounter: Payer: Self-pay | Admitting: Family Medicine

## 2023-03-22 ENCOUNTER — Telehealth: Payer: Self-pay | Admitting: *Deleted

## 2023-03-22 NOTE — Telephone Encounter (Signed)
Pt sent a message saying:  I just talked with my insurance and ask the best way to go about getting this medication.He told me coverage determation. Hopefully this will get my foot in the door. Would you please try this again? Or if there is an alternative?  He said there was but it didn't show up on his computer.

## 2023-03-24 ENCOUNTER — Other Ambulatory Visit: Payer: Self-pay | Admitting: Family Medicine

## 2023-03-24 MED ORDER — TIRZEPATIDE-WEIGHT MANAGEMENT 2.5 MG/0.5ML ~~LOC~~ SOLN: 2.5000 mg | SUBCUTANEOUS | 0 refills | Status: DC

## 2023-03-24 NOTE — Telephone Encounter (Signed)
Alternative would be zepbound (similar /same class)   I sent to your pharmacy  Let's see how the prior Lori Orozco goes So far I am not having luck getting these medicines covered for obesity

## 2023-03-25 NOTE — Telephone Encounter (Signed)
Spoke with patients wife this morning she advised that zepbound vial was not covered by insurance. Rx sent for Zepbound pens.

## 2023-03-25 NOTE — Telephone Encounter (Signed)
Hope the pens will be covered -thanks

## 2023-03-25 NOTE — Telephone Encounter (Signed)
Please let pt know this is not on formulary

## 2023-03-26 NOTE — Telephone Encounter (Signed)
Received this letter from [place] today

## 2023-03-26 NOTE — Telephone Encounter (Signed)
Please let her know that we received note that medicare part D will not cover any medicines for weight loss

## 2023-03-27 NOTE — Telephone Encounter (Signed)
Called patient and let her know. She will reach out if any further questions.

## 2023-04-01 ENCOUNTER — Ambulatory Visit: Payer: Medicare Other | Admitting: Internal Medicine

## 2023-04-01 DIAGNOSIS — R29898 Other symptoms and signs involving the musculoskeletal system: Secondary | ICD-10-CM

## 2023-04-01 DIAGNOSIS — R296 Repeated falls: Secondary | ICD-10-CM

## 2023-04-01 DIAGNOSIS — M0600 Rheumatoid arthritis without rheumatoid factor, unspecified site: Secondary | ICD-10-CM

## 2023-04-01 DIAGNOSIS — G609 Hereditary and idiopathic neuropathy, unspecified: Secondary | ICD-10-CM

## 2023-04-01 DIAGNOSIS — Z7952 Long term (current) use of systemic steroids: Secondary | ICD-10-CM

## 2023-04-01 DIAGNOSIS — Z79899 Other long term (current) drug therapy: Secondary | ICD-10-CM

## 2023-04-01 DIAGNOSIS — G894 Chronic pain syndrome: Secondary | ICD-10-CM

## 2023-04-01 DIAGNOSIS — M8000XS Age-related osteoporosis with current pathological fracture, unspecified site, sequela: Secondary | ICD-10-CM

## 2023-04-03 ENCOUNTER — Other Ambulatory Visit: Payer: Self-pay

## 2023-04-04 ENCOUNTER — Ambulatory Visit: Payer: Self-pay

## 2023-04-04 NOTE — Patient Outreach (Signed)
  Care Coordination   Follow Up Visit Note   04/05/2023 Name: Lori Orozco MRN: [id number] DOB: 06-Jun-1950  Lori Orozco is a 73 y.o. year old female who sees Tower, Laine LABOR, MD for primary care. I spoke with  [name] by phone today.  What matters to the patients health and wellness today?  Patient states she didn't get to see her rheumatologist.  Patient states she has not heard back from the orthopedic office regarding a date for her hand surgery.  Patient states she is having visits from [facility] home health however they are not doing any physical therapy. She states the person visiting is checking her vital signs and talking with her only.  She states someone from [facility] came out on yesterday but didn't see her. She states she was at home but evidently didn't hear the knock/ ring. Patient states it takes her time to get to the door due to impaired mobility.  Patient denies any recent falls.    Goals Addressed             This Visit's Progress    assistance with community resources and management of chronic health conditions       Interventions Today    Flowsheet Row Most Recent Value  Chronic Disease   Chronic disease during today's visit Other  [RA, chronic back pain / leg pain / falls/ right hand arthritis)]  General Interventions   General Interventions Discussed/Reviewed General Interventions Reviewed, Doctor Visits  [evaluation of current treatment plan for listed health conditions and patients adherence to plan as established by provider. Assessed for chronic leg/ back pain, Falls, RA]  Doctor Visits Discussed/Reviewed Doctor Visits Reviewed  [reviewed upcoming provider visits.]  Exercise Interventions   Physical Activity Discussed/Reviewed Physical Activity Reviewed  [Inquired if receiving home home health services. Will call [facility] home[facility] to discuss ongoing treatment plan.]  Education Interventions   Education Provided Provided Education   [Advised to call orthopedic provider office to discuss surgery date for right hand.]  Pharmacy Interventions   Pharmacy Dicussed/Reviewed Pharmacy Topics Reviewed  [medications reviewed and compliance discussed. discussed newly added nasal medication.]  Safety Interventions   Safety Discussed/Reviewed Fall Risk  [Assessed for falls. Fall preventions reviewed.]                  SDOH assessments and interventions completed:  No     Care Coordination Interventions:  Yes, provided   Follow up plan: Follow up call scheduled for 05/03/23 3 pm    Encounter Outcome:  Patient Visit Completed   Zaylen Susman RN, BSN, CCM [facility], Population Health Case Manager Phone: [phone]

## 2023-04-05 NOTE — Patient Instructions (Signed)
 Visit Information  Thank you for taking time to visit with me today. Please don't hesitate to contact me if I can be of assistance to you.   Following are the goals we discussed today:   Goals Addressed             This Visit's Progress    assistance with community resources and management of chronic health conditions       Interventions Today    Flowsheet Row Most Recent Value  Chronic Disease   Chronic disease during today's visit Other  [RA, chronic back pain / leg pain / falls/ right hand arthritis)]  General Interventions   General Interventions Discussed/Reviewed General Interventions Reviewed, Doctor Visits  [evaluation of current treatment plan for listed health conditions and patients adherence to plan as established by provider. Assessed for chronic leg/ back pain, Falls, RA]  Doctor Visits Discussed/Reviewed Doctor Visits Reviewed  [reviewed upcoming provider visits.]  Exercise Interventions   Physical Activity Discussed/Reviewed Physical Activity Reviewed  [Inquired if receiving home home health services. Will call [facility] to discuss ongoing treatment plan.]  Education Interventions   Education Provided Provided Education  [Advised to call orthopedic provider office to discuss surgery date for right hand.]  Pharmacy Interventions   Pharmacy Dicussed/Reviewed Pharmacy Topics Reviewed  [medications reviewed and compliance discussed. discussed newly added nasal medication.]  Safety Interventions   Safety Discussed/Reviewed Fall Risk  [Assessed for falls. Fall preventions reviewed.]                  Our next appointment is by telephone on 05/03/23 at 3 pm  Please call the care guide team at [phone] if you need to cancel or reschedule your appointment.   If you are experiencing a Mental Health or Behavioral Health Crisis or need someone to talk to, please call the Suicide and Crisis Lifeline: 988 call 1-800-273-TALK (toll free, 24 hour  hotline)  Patient verbalizes understanding of instructions and care plan provided today and agrees to view in MyChart. Active MyChart status and patient understanding of how to access instructions and care plan via MyChart confirmed with patient.     Arvin Seip RN, BSN, CCM [employer], Population Health Case Manager Phone: [phone]

## 2023-04-08 ENCOUNTER — Telehealth: Payer: Self-pay

## 2023-04-08 NOTE — Patient Instructions (Signed)
 Visit Information  Thank you for taking time to visit with me today. Please don't hesitate to contact me if I can be of assistance to you.   Following are the goals we discussed today:   Goals Addressed             This Visit's Progress    assistance with community resources and management of chronic health conditions       Interventions Today    Flowsheet Row Most Recent Value  Chronic Disease   Chronic disease during today's visit Other  [chronic back/leg pain, falls.]  General Interventions   General Interventions Discussed/Reviewed General Interventions Reviewed  [Spoke with Theressa Flatness at [employer] regarding patients pt/ ot / sw follow up visits. Explained to patient overall process regarding initial evaluations for services with home health and follow up .]  Education Interventions   Education Provided Provided Education  [facility] requesting return to discuss patients current plan of care. Advised patient to follow up with the social worker she spoke with regarding current status of wheelchair request.]              Our next appointment is by telephone on 05/03/23 at 3 pm  Please call the care guide team at [phone] if you need to cancel or reschedule your appointment.   If you are experiencing a Mental Health or Behavioral Health Crisis or need someone to talk to, please call the Suicide and Crisis Lifeline: 988 call 1-800-273-TALK (toll free, 24 hour hotline)  Patient verbalizes understanding of instructions and care plan provided today and agrees to view in MyChart. Active MyChart status and patient understanding of how to access instructions and care plan via MyChart confirmed with patient.     Verba Girt RN, BSN, CCM [employer], Population Health Case Manager Phone: [phone]

## 2023-04-08 NOTE — Patient Outreach (Signed)
  Care Coordination   Follow Up Visit Note   04/08/2023 Name: Lori Orozco MRN: [id number] DOB: [birth year]  Lori Orozco is a 73 y.o. year old female who sees Tower, Manley Seeds, MD for primary care. I spoke with  Augustina Block by phone today. I spoke with Nellie Banas, Development worker, international aid with [facility] to day.   What matters to the patients health and wellness today?  Telephone call to [facility] home health to get overview of when patient will have PT/ OT visits.  [facility] clinical manager states patient had initial evaluation for OT on 03/18/23 and with PT on 03/29/23.  She states patient declined PT visit on last week however was rescheduled for today.  Nellie Banas states patient is scheduled to have OT and SW visit this week as well.   Patient contacted by this RNCM and home health services process and up coming visits discussed.  Patient inquired if RNCM knew anything about the requested wheelchair and conversation she had with [facility] Child psychotherapist.  Patient was advised to discuss wheelchair request with social worker at next visit.     Goals Addressed             This Visit's Progress    assistance with community resources and management of chronic health conditions       Interventions Today    Flowsheet Row Most Recent Value  Chronic Disease   Chronic disease during today's visit Other  [chronic back/leg pain, falls.]  General Interventions   General Interventions Discussed/Reviewed General Interventions Reviewed  [Spoke with Theressa Flatness at [employer] regarding patients pt/ ot / sw follow up visits. Explained to patient overall process regarding initial evaluations for services with home health and follow up .]  Education Interventions   Education Provided Provided Education  [facility] requesting return to discuss patients current plan of care. Advised patient to follow up with the social worker she spoke with regarding  current status of wheelchair request.]              SDOH assessments and interventions completed:  No     Care Coordination Interventions:  Yes, provided   Follow up plan: Follow up call scheduled for as previously scheduled on 05/03/23    Encounter Outcome:  Patient Visit Completed   Brenisha Tsui RN, BSN, CCM [place]  [facility], Population Health Case Manager Phone: [phone]

## 2023-04-15 ENCOUNTER — Other Ambulatory Visit: Payer: Self-pay | Admitting: Family Medicine

## 2023-04-16 ENCOUNTER — Telehealth: Payer: Self-pay

## 2023-04-16 NOTE — Telephone Encounter (Signed)
Copied from CRM [id number]. Topic: Clinical - Home Health Verbal Orders >> Apr 15, 2023  4:55 PM Corin V wrote: Caller/Agency: [facility] Callback Number: [phone] Service Requested: Occupational Therapy Frequency: requesting discharge order Any new concerns about the patient? Yes- Patient is agitated, confused, overwhelmed, nothing more OT can do for patient

## 2023-04-16 NOTE — Telephone Encounter (Signed)
VO given to Lori Orozco to d/c OT.  Lori Orozco did let me know she is very concern for Lori Orozco's mental health. She she feels like Lori Orozco may be having a mental breakdown. Her personality has changed and she is very angry, agitated, and overwhelmed. She is struggling with ADLs. Also she said her cognitive appears to be declining also. Lori Orozco's sister who takes care of her recently had surgery and now she is struggling to take care of Lori Orozco.

## 2023-04-16 NOTE — Telephone Encounter (Signed)
Please send a copy of this encounter to psychiatry office as well, thanks

## 2023-04-16 NOTE — Telephone Encounter (Signed)
Please ok that verbal order  

## 2023-04-16 NOTE — Telephone Encounter (Signed)
Thanks for the heads up  She sees psychiatry for bipolar and other issues  Has she missed any medicines?  Can caregiver please reach out to her psychiatrist asap?  If unable to get through let me know   If she is a threat to herself or others - I recommend ER/ 911 or the [facility] on [address] in [place] (I think does walk ins)    I will route this back to myself to watch for progress   So sorry to hear this [place] me posted

## 2023-04-18 NOTE — Telephone Encounter (Signed)
Spoke with sister Rayna Sexton and she is very overwhelmed, pt's behavior has changed and she is struggling to take care of her but she knows it would make her mental status worse if she stopped helping her because she's all that she has right now. Pam said pt hasn't missed any doses of meds and she isn't to the point where she can take her to behavioral health because she isn't a threat to herself or others she is just very depressed and gets angry quickly. Pam said she can't reach out to psychiatrist because she doesn't know who she see, she couldn't give me that info so I can't send this note to them. Also pt decided to stop seeing her therapist because she wasn't happy with them and decided to try and find another one so Pam doesn't have anyone to reach out to and isn't sure what to do she is asking if PCP could advise her or help her

## 2023-04-18 NOTE — Telephone Encounter (Signed)
Psychiatry really needs to be aware since I do not /can not manage her mental health issues  Her psychiatrist's name or clinic name and number should be on the bottles of her medication  Also she may be able to get that from the pharmacist   Let me know how that goes and we will make a plan from there    Ask her to call back and let us know who her psychiatrist or psychiatry practice is as soon as she finds out  I do not have access to her psychiatric notes as these are confidential but we need to make sure she can get help

## 2023-04-19 ENCOUNTER — Other Ambulatory Visit: Payer: Self-pay | Admitting: Pharmacist

## 2023-04-19 NOTE — Progress Notes (Signed)
Patient has [employer] subsidy now so Enbrel will be filled through insurance and not PAP  Continue Enbrel Sureclick 50mg  SQ eery 7 days  Chesley Mires, PharmD, MPH, BCPS, CPP Clinical Pharmacist (Rheumatology and Pulmonology)

## 2023-04-19 NOTE — Telephone Encounter (Signed)
Spoke with Pam and she said that she will check in with psychiatry, she said she feels like pt was just over OT because they kept coming late and then they never showed her anything they just kept "talking" to her. Pt was over it on top of feeling overwhelmed with hand issues and mobility problems and that it was upsetting her mood. Sister said she will f/u with Psychiatry but she thinks pt is just a little more depressed them normal but she does have a lot on her plate. She thanked Dr. Milinda Antis for the concern but said she will take care of it and no call back is needed.   FYI to PCP

## 2023-04-20 ENCOUNTER — Other Ambulatory Visit: Payer: Self-pay | Admitting: Internal Medicine

## 2023-04-20 DIAGNOSIS — M0600 Rheumatoid arthritis without rheumatoid factor, unspecified site: Secondary | ICD-10-CM

## 2023-04-22 ENCOUNTER — Other Ambulatory Visit (HOSPITAL_COMMUNITY): Payer: Self-pay

## 2023-04-22 ENCOUNTER — Telehealth: Payer: Self-pay | Admitting: Pharmacist

## 2023-04-22 DIAGNOSIS — M0600 Rheumatoid arthritis without rheumatoid factor, unspecified site: Secondary | ICD-10-CM

## 2023-04-22 DIAGNOSIS — Z79899 Other long term (current) drug therapy: Secondary | ICD-10-CM

## 2023-04-22 MED ORDER — ENBREL SURECLICK 50 MG/ML ~~LOC~~ SOAJ: 50.0000 mg | SUBCUTANEOUS | 0 refills | Status: DC

## 2023-04-22 NOTE — Telephone Encounter (Signed)
 Patient ended up being approved for patient assistance for Enbrel through [employer] until 02/26/2024. Unclear as to how this happened but patient is aware. Rx for Enbrel needs to be sent to [employer]'s PAP pharmacy ([facility]) moving forward  Chesley Mires, PharmD, MPH, BCPS, CPP Clinical Pharmacist (Rheumatology and Pulmonology)

## 2023-04-22 NOTE — Telephone Encounter (Signed)
 Last Fill: 01/22/2023  Next Visit: 05/28/2023  Last Visit: 12/24/2022  DX: Seronegative rheumatoid arthritis   Current Dose per office note on 12/24/2022: prednisone 10 mg daily.   Okay to refill prednisone?

## 2023-04-22 NOTE — Progress Notes (Signed)
 Pt received an approval letter from [employer] and has already received her first shipment. No need to enroll into pharmacy services. Closing encounter.

## 2023-04-27 ENCOUNTER — Other Ambulatory Visit: Payer: Self-pay | Admitting: Family Medicine

## 2023-05-01 DIAGNOSIS — N189 Chronic kidney disease, unspecified: Secondary | ICD-10-CM

## 2023-05-01 DIAGNOSIS — D631 Anemia in chronic kidney disease: Secondary | ICD-10-CM

## 2023-05-01 DIAGNOSIS — M0600 Rheumatoid arthritis without rheumatoid factor, unspecified site: Secondary | ICD-10-CM

## 2023-05-01 DIAGNOSIS — M19041 Primary osteoarthritis, right hand: Secondary | ICD-10-CM | POA: Diagnosis not present

## 2023-05-01 DIAGNOSIS — J439 Emphysema, unspecified: Secondary | ICD-10-CM

## 2023-05-01 DIAGNOSIS — M5126 Other intervertebral disc displacement, lumbar region: Secondary | ICD-10-CM

## 2023-05-01 DIAGNOSIS — G8929 Other chronic pain: Secondary | ICD-10-CM | POA: Diagnosis not present

## 2023-05-01 DIAGNOSIS — M7062 Trochanteric bursitis, left hip: Secondary | ICD-10-CM

## 2023-05-01 DIAGNOSIS — M7918 Myalgia, other site: Secondary | ICD-10-CM

## 2023-05-01 DIAGNOSIS — J4489 Other specified chronic obstructive pulmonary disease: Secondary | ICD-10-CM | POA: Diagnosis not present

## 2023-05-01 DIAGNOSIS — I129 Hypertensive chronic kidney disease with stage 1 through stage 4 chronic kidney disease, or unspecified chronic kidney disease: Secondary | ICD-10-CM | POA: Diagnosis not present

## 2023-05-01 DIAGNOSIS — M810 Age-related osteoporosis without current pathological fracture: Secondary | ICD-10-CM

## 2023-05-02 ENCOUNTER — Telehealth: Payer: Self-pay | Admitting: *Deleted

## 2023-05-02 NOTE — Telephone Encounter (Signed)
 Patient contacted the office stating she needs a refill on Prednisone to be sent to the pharmacy.   Prescription refill was sent to the pharmacy on 04/23/2023.   Patient advised to contact pharmacy. Patient expressed understanding.

## 2023-05-03 ENCOUNTER — Ambulatory Visit: Payer: Self-pay

## 2023-05-03 NOTE — Patient Instructions (Signed)
 Visit Information  Thank you for taking time to visit with me today. Please don't hesitate to contact me if I can be of assistance to you.   Following are the goals we discussed today:   Goals Addressed             This Visit's Progress    assistance with community resources and management of chronic health conditions       Interventions Today    Flowsheet Row Most Recent Value  Chronic Disease   Chronic disease during today's visit Other  [RA, chronic back pain / leg pain / falls/ right hand arthritis))]  General Interventions   General Interventions Discussed/Reviewed General Interventions Reviewed, Doctor Visits  [evaluation of current treatment plan for listed health conditions and patients adherence to plan as established by provider. Assessed for pain and for any new/ ongoing symptoms.]  Doctor Visits Discussed/Reviewed Doctor Visits Reviewed  Annabell Sabal upcoming provider visits. Advised to keep follow up visits with providers as recommended.]  Exercise Interventions   Exercise Discussed/Reviewed Physical Activity  [confirmed if patient continues to have home health therapy services.]  Education Interventions   Education Provided Provided Education  [Reviewed and assessed for signs of infection. Advised to notify provider for infection like sympotms. Advised to keep scratches to bilateral legs clean and dry.]  Pharmacy Interventions   Pharmacy Dicussed/Reviewed Pharmacy Topics Reviewed  [medications reviewed and compliance discussed.]  Safety Interventions   Safety Discussed/Reviewed Fall Risk  [Assessed for falls. Advised to notify provider of falls. Advised to consider getting bed rails to provide an extra level of support/ safety.  Advised to not lean over bed when laying down. Advised to sit up or stand up from bed to pick up items on floor.]              Our next appointment is by telephone on 06/05/23 at 2 pm  Please call the care guide team at [phone] if you  need to cancel or reschedule your appointment.   If you are experiencing a Mental Health or Behavioral Health Crisis or need someone to talk to, please call the Suicide and Crisis Lifeline: 988 call 1-800-273-TALK (toll free, 24 hour hotline)  Patient verbalizes understanding of instructions and care plan provided today and agrees to view in MyChart. Active MyChart status and patient understanding of how to access instructions and care plan via MyChart confirmed with patient.     George Ina RN, BSN, CCM [employer], Population Health Case Manager Phone: [phone]

## 2023-05-03 NOTE — Patient Outreach (Signed)
 Care Coordination   Follow Up Visit Note   05/03/2023 Name: Lori Orozco MRN: [id number] DOB: 1950/03/12  Lori Orozco is a 73 y.o. year old female who sees Tower, Audrie Gallus, MD for primary care. I spoke with  [name] by phone today.  What matters to the patients health and wellness today?  Patient states she has a follow up visit scheduled with Dr. [name] regarding her  right hand surgery.  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 Patient states she hopes a decision will be made soon regarding the surgery.   Patient states her sister will be able to help her post surgery.   Patient states she continues to have PT from the home health agency. She states she recently cancelled a therapy session because therapist showed up 30  minutes late. Patient states she thinks she met with the social worker however unsure.  Patient reports sustaining a recent fall out of her bed. She states she turned over in bed to pick up something and slid off scraping her legs. She reports applying neosporin to the scraps on her legs and denies any signs of infection.  Patient states this has happened before. Patient denies any overall pain at this time. She states she will eventually have back surgery as well however what's to have her hand surgery first.    Goals Addressed             This Visit's Progress    assistance with community resources and management of chronic health conditions       Interventions Today    Flowsheet Row Most Recent Value  Chronic Disease   Chronic disease during today's visit Other  [RA, chronic back pain / leg pain / falls/ right hand arthritis))]  General Interventions   General Interventions Discussed/Reviewed General Interventions Reviewed, Doctor Visits  [evaluation of current treatment plan for listed health conditions and patients adherence to plan as established by provider. Assessed for pain and for any new/ ongoing symptoms.]  Doctor  Visits Discussed/Reviewed Doctor Visits Reviewed  Annabell Sabal upcoming provider visits. Advised to keep follow up visits with providers as recommended.]  Exercise Interventions   Exercise Discussed/Reviewed Physical Activity  [confirmed if patient continues to have home health therapy services.]  Education Interventions   Education Provided Provided Education  [Reviewed and assessed for signs of infection. Advised to notify provider for infection like sympotms. Advised to keep scratches to bilateral legs clean and dry.]  Pharmacy Interventions   Pharmacy Dicussed/Reviewed Pharmacy Topics Reviewed  [medications reviewed and compliance discussed.]  Safety Interventions   Safety Discussed/Reviewed Fall Risk  [Assessed for falls. Advised to notify provider of falls. Advised to consider getting bed rails to provide an extra level of support/ safety.  Advised to not lean over bed when laying down. Advised to sit up or stand up from bed to pick up items on floor.]              SDOH assessments and interventions completed:  No     Care Coordination Interventions:  Yes, provided   Follow up plan: Follow up call scheduled for 06/05/23 at 2 pm    Encounter Outcome:  Patient Visit Completed   George Ina RN, BSN, CCM [place]  [facility], Population Health Case Manager Phone: [phone]

## 2023-05-04 ENCOUNTER — Inpatient Hospital Stay (HOSPITAL_COMMUNITY)
Admission: EM | Admit: 2023-05-04 | Discharge: 2023-05-11 | DRG: 208 | Disposition: A | Discharge status: Skilled Nursing Facility | Attending: Internal Medicine | Admitting: Internal Medicine

## 2023-05-04 ENCOUNTER — Emergency Department (HOSPITAL_COMMUNITY)

## 2023-05-04 ENCOUNTER — Other Ambulatory Visit: Payer: Self-pay

## 2023-05-04 ENCOUNTER — Encounter (HOSPITAL_COMMUNITY): Payer: Self-pay

## 2023-05-04 DIAGNOSIS — J9690 Respiratory failure, unspecified, unspecified whether with hypoxia or hypercapnia: Secondary | ICD-10-CM | POA: Diagnosis present

## 2023-05-04 DIAGNOSIS — Z781 Physical restraint status: Secondary | ICD-10-CM

## 2023-05-04 DIAGNOSIS — R7989 Other specified abnormal findings of blood chemistry: Secondary | ICD-10-CM | POA: Diagnosis present

## 2023-05-04 DIAGNOSIS — Z86711 Personal history of pulmonary embolism: Secondary | ICD-10-CM | POA: Diagnosis not present

## 2023-05-04 DIAGNOSIS — D849 Immunodeficiency, unspecified: Secondary | ICD-10-CM | POA: Diagnosis present

## 2023-05-04 DIAGNOSIS — E1122 Type 2 diabetes mellitus with diabetic chronic kidney disease: Secondary | ICD-10-CM | POA: Diagnosis present

## 2023-05-04 DIAGNOSIS — Z8261 Family history of arthritis: Secondary | ICD-10-CM

## 2023-05-04 DIAGNOSIS — I428 Other cardiomyopathies: Secondary | ICD-10-CM | POA: Diagnosis not present

## 2023-05-04 DIAGNOSIS — J439 Emphysema, unspecified: Secondary | ICD-10-CM | POA: Diagnosis present

## 2023-05-04 DIAGNOSIS — J9601 Acute respiratory failure with hypoxia: Secondary | ICD-10-CM | POA: Diagnosis not present

## 2023-05-04 DIAGNOSIS — J9602 Acute respiratory failure with hypercapnia: Principal | ICD-10-CM | POA: Diagnosis present

## 2023-05-04 DIAGNOSIS — F319 Bipolar disorder, unspecified: Secondary | ICD-10-CM | POA: Diagnosis present

## 2023-05-04 DIAGNOSIS — I160 Hypertensive urgency: Secondary | ICD-10-CM | POA: Diagnosis present

## 2023-05-04 DIAGNOSIS — M0600 Rheumatoid arthritis without rheumatoid factor, unspecified site: Secondary | ICD-10-CM | POA: Diagnosis present

## 2023-05-04 DIAGNOSIS — Z96652 Presence of left artificial knee joint: Secondary | ICD-10-CM | POA: Diagnosis present

## 2023-05-04 DIAGNOSIS — J441 Chronic obstructive pulmonary disease with (acute) exacerbation: Secondary | ICD-10-CM | POA: Diagnosis present

## 2023-05-04 DIAGNOSIS — J189 Pneumonia, unspecified organism: Secondary | ICD-10-CM | POA: Diagnosis present

## 2023-05-04 DIAGNOSIS — Z91048 Other nonmedicinal substance allergy status: Secondary | ICD-10-CM

## 2023-05-04 DIAGNOSIS — F418 Other specified anxiety disorders: Secondary | ICD-10-CM | POA: Diagnosis not present

## 2023-05-04 DIAGNOSIS — I2694 Multiple subsegmental pulmonary emboli without acute cor pulmonale: Secondary | ICD-10-CM | POA: Diagnosis present

## 2023-05-04 DIAGNOSIS — Z800 Family history of malignant neoplasm of digestive organs: Secondary | ICD-10-CM

## 2023-05-04 DIAGNOSIS — N1832 Chronic kidney disease, stage 3b: Secondary | ICD-10-CM | POA: Diagnosis present

## 2023-05-04 DIAGNOSIS — Z90710 Acquired absence of both cervix and uterus: Secondary | ICD-10-CM

## 2023-05-04 DIAGNOSIS — G9341 Metabolic encephalopathy: Secondary | ICD-10-CM | POA: Diagnosis present

## 2023-05-04 DIAGNOSIS — Z96612 Presence of left artificial shoulder joint: Secondary | ICD-10-CM | POA: Diagnosis present

## 2023-05-04 DIAGNOSIS — G9340 Encephalopathy, unspecified: Secondary | ICD-10-CM | POA: Diagnosis present

## 2023-05-04 DIAGNOSIS — E039 Hypothyroidism, unspecified: Secondary | ICD-10-CM | POA: Diagnosis present

## 2023-05-04 DIAGNOSIS — I129 Hypertensive chronic kidney disease with stage 1 through stage 4 chronic kidney disease, or unspecified chronic kidney disease: Secondary | ICD-10-CM | POA: Diagnosis present

## 2023-05-04 DIAGNOSIS — N179 Acute kidney failure, unspecified: Secondary | ICD-10-CM | POA: Diagnosis present

## 2023-05-04 DIAGNOSIS — Z823 Family history of stroke: Secondary | ICD-10-CM

## 2023-05-04 DIAGNOSIS — Z7985 Long-term (current) use of injectable non-insulin antidiabetic drugs: Secondary | ICD-10-CM

## 2023-05-04 DIAGNOSIS — Z7983 Long term (current) use of bisphosphonates: Secondary | ICD-10-CM

## 2023-05-04 DIAGNOSIS — Z79899 Other long term (current) drug therapy: Secondary | ICD-10-CM

## 2023-05-04 DIAGNOSIS — Z883 Allergy status to other anti-infective agents status: Secondary | ICD-10-CM

## 2023-05-04 DIAGNOSIS — E119 Type 2 diabetes mellitus without complications: Secondary | ICD-10-CM | POA: Diagnosis not present

## 2023-05-04 DIAGNOSIS — J449 Chronic obstructive pulmonary disease, unspecified: Secondary | ICD-10-CM | POA: Diagnosis not present

## 2023-05-04 DIAGNOSIS — Z87891 Personal history of nicotine dependence: Secondary | ICD-10-CM

## 2023-05-04 DIAGNOSIS — Z7901 Long term (current) use of anticoagulants: Secondary | ICD-10-CM | POA: Diagnosis not present

## 2023-05-04 DIAGNOSIS — J440 Chronic obstructive pulmonary disease with acute lower respiratory infection: Secondary | ICD-10-CM | POA: Diagnosis present

## 2023-05-04 DIAGNOSIS — Z881 Allergy status to other antibiotic agents status: Secondary | ICD-10-CM

## 2023-05-04 DIAGNOSIS — Z8249 Family history of ischemic heart disease and other diseases of the circulatory system: Secondary | ICD-10-CM

## 2023-05-04 DIAGNOSIS — Z79890 Hormone replacement therapy (postmenopausal): Secondary | ICD-10-CM | POA: Diagnosis not present

## 2023-05-04 DIAGNOSIS — Z841 Family history of disorders of kidney and ureter: Secondary | ICD-10-CM

## 2023-05-04 DIAGNOSIS — Z885 Allergy status to narcotic agent status: Secondary | ICD-10-CM

## 2023-05-04 DIAGNOSIS — F411 Generalized anxiety disorder: Secondary | ICD-10-CM | POA: Diagnosis present

## 2023-05-04 DIAGNOSIS — Z888 Allergy status to other drugs, medicaments and biological substances status: Secondary | ICD-10-CM

## 2023-05-04 DIAGNOSIS — N1831 Chronic kidney disease, stage 3a: Secondary | ICD-10-CM | POA: Diagnosis not present

## 2023-05-04 DIAGNOSIS — E785 Hyperlipidemia, unspecified: Secondary | ICD-10-CM | POA: Diagnosis present

## 2023-05-04 DIAGNOSIS — Z96641 Presence of right artificial hip joint: Secondary | ICD-10-CM | POA: Diagnosis present

## 2023-05-04 DIAGNOSIS — M797 Fibromyalgia: Secondary | ICD-10-CM | POA: Diagnosis present

## 2023-05-04 DIAGNOSIS — I10 Essential (primary) hypertension: Secondary | ICD-10-CM | POA: Diagnosis not present

## 2023-05-04 DIAGNOSIS — Z880 Allergy status to penicillin: Secondary | ICD-10-CM

## 2023-05-04 LAB — COMPREHENSIVE METABOLIC PANEL
ALT: 28 U/L (ref 0–44)
AST: 37 U/L (ref 15–41)
Albumin: 3.2 g/dL — ABNORMAL LOW (ref 3.5–5.0)
Alkaline Phosphatase: 40 U/L (ref 38–126)
Anion gap: 15 (ref 5–15)
BUN: 24 mg/dL — ABNORMAL HIGH (ref 8–23)
CO2: 32 mmol/L (ref 22–32)
Calcium: 9.1 mg/dL (ref 8.9–10.3)
Chloride: 96 mmol/L — ABNORMAL LOW (ref 98–111)
Creatinine, Ser: 1.17 mg/dL — ABNORMAL HIGH (ref 0.44–1.00)
GFR, Estimated: 50 mL/min — ABNORMAL LOW (ref >60)
Glucose, Bld: 125 mg/dL — ABNORMAL HIGH (ref 70–99)
Potassium: 4.2 mmol/L (ref 3.5–5.1)
Sodium: 143 mmol/L (ref 135–145)
Total Bilirubin: 0.9 mg/dL (ref 0.0–1.2)
Total Protein: 6.5 g/dL (ref 6.5–8.1)

## 2023-05-04 LAB — CBC WITH DIFFERENTIAL/PLATELET
Abs Immature Granulocytes: 0.07 10*3/uL (ref 0.00–0.07)
Basophils Absolute: 0 10*3/uL (ref 0.0–0.1)
Basophils Relative: 0 %
Eosinophils Absolute: 0.1 10*3/uL (ref 0.0–0.5)
Eosinophils Relative: 1 %
HCT: 42.3 % (ref 36.0–46.0)
Hemoglobin: 12.7 g/dL (ref 12.0–15.0)
Immature Granulocytes: 1 %
Lymphocytes Relative: 50 %
Lymphs Abs: 3.7 10*3/uL (ref 0.7–4.0)
MCH: 31.4 pg (ref 26.0–34.0)
MCHC: 30 g/dL (ref 30.0–36.0)
MCV: 104.4 fL — ABNORMAL HIGH (ref 80.0–100.0)
Monocytes Absolute: 0.8 10*3/uL (ref 0.1–1.0)
Monocytes Relative: 12 %
Neutro Abs: 2.6 10*3/uL (ref 1.7–7.7)
Neutrophils Relative %: 36 %
Platelets: 190 10*3/uL (ref 150–400)
RBC: 4.05 MIL/uL (ref 3.87–5.11)
RDW: 16.9 % — ABNORMAL HIGH (ref 11.5–15.5)
WBC: 7.2 10*3/uL (ref 4.0–10.5)
nRBC: 0.8 % — ABNORMAL HIGH (ref 0.0–0.2)

## 2023-05-04 LAB — AMMONIA: Ammonia: 35 umol/L (ref 9–35)

## 2023-05-04 LAB — I-STAT VENOUS BLOOD GAS, ED
Acid-Base Excess: 5 mmol/L — ABNORMAL HIGH (ref 0.0–2.0)
Acid-Base Excess: 6 mmol/L — ABNORMAL HIGH (ref 0.0–2.0)
Bicarbonate: 35.5 mmol/L — ABNORMAL HIGH (ref 20.0–28.0)
Bicarbonate: 36.8 mmol/L — ABNORMAL HIGH (ref 20.0–28.0)
Calcium, Ion: 1.13 mmol/L — ABNORMAL LOW (ref 1.15–1.40)
Calcium, Ion: 1.16 mmol/L (ref 1.15–1.40)
HCT: 39 % (ref 36.0–46.0)
HCT: 41 % (ref 36.0–46.0)
Hemoglobin: 13.3 g/dL (ref 12.0–15.0)
Hemoglobin: 13.9 g/dL (ref 12.0–15.0)
O2 Saturation: 82 %
O2 Saturation: 99 %
Potassium: 4 mmol/L (ref 3.5–5.1)
Potassium: 4.1 mmol/L (ref 3.5–5.1)
Sodium: 139 mmol/L (ref 135–145)
Sodium: 139 mmol/L (ref 135–145)
TCO2: 38 mmol/L — ABNORMAL HIGH (ref 22–32)
TCO2: 39 mmol/L — ABNORMAL HIGH (ref 22–32)
pCO2, Ven: 83.3 mmHg (ref 44–60)
pCO2, Ven: 84.4 mmHg (ref 44–60)
pH, Ven: 7.238 — ABNORMAL LOW (ref 7.25–7.43)
pH, Ven: 7.248 — ABNORMAL LOW (ref 7.25–7.43)
pO2, Ven: 143 mmHg — ABNORMAL HIGH (ref 32–45)
pO2, Ven: 57 mmHg — ABNORMAL HIGH (ref 32–45)

## 2023-05-04 LAB — LACTIC ACID, PLASMA: Lactic Acid, Venous: 1.3 mmol/L (ref 0.5–1.9)

## 2023-05-04 LAB — RESP PANEL BY RT-PCR (RSV, FLU A&B, COVID)  RVPGX2
Influenza A by PCR: NEGATIVE
Influenza B by PCR: NEGATIVE
Resp Syncytial Virus by PCR: NEGATIVE
SARS Coronavirus 2 by RT PCR: NEGATIVE

## 2023-05-04 LAB — TROPONIN I (HIGH SENSITIVITY): Troponin I (High Sensitivity): 54 ng/L — ABNORMAL HIGH (ref <18)

## 2023-05-04 LAB — BRAIN NATRIURETIC PEPTIDE: B Natriuretic Peptide: 204.4 pg/mL — ABNORMAL HIGH (ref 0.0–100.0)

## 2023-05-04 MED ORDER — LABETALOL HCL 5 MG/ML IV SOLN
10.0000 mg | Freq: Once | INTRAVENOUS | Status: AC
  Administered 2023-05-04: 10 mg via INTRAVENOUS
  Filled 2023-05-04: qty 4

## 2023-05-04 MED ORDER — SODIUM CHLORIDE 0.9 % IV BOLUS
500.0000 mL | Freq: Once | INTRAVENOUS | Status: AC
  Administered 2023-05-04: 500 mL via INTRAVENOUS

## 2023-05-04 NOTE — ED Provider Notes (Signed)
 [place] EMERGENCY DEPARTMENT AT [facility] Provider Note   CSN: [id number] Arrival date & time: 05/04/23  2118     History  Chief Complaint  Patient presents with   Respiratory Distress    Lori Orozco is a 73 y.o. female.  HPI   73 year old female arrives to the emergency department in respiratory distress, initially altered with EMS.  History limited secondary to acuity.  Report from EMS is that the patient has been sick with flulike symptoms for the past couple days.  A friend called for acute respiratory distress and altered mental status.  EMS states that her room air saturation was around 50%.  She received DuoNebs in route and was transition to BiPAP on arrival.  Home Medications Prior to Admission medications   Medication Sig Start Date End Date Taking? Authorizing Provider  albuterol (VENTOLIN HFA) 108 (90 Base) MCG/ACT inhaler Inhale 1-2 puffs into the lungs every 4 (four) hours as needed for wheezing or shortness of breath. 06/29/21   Domenick Gong, MD  alendronate (FOSAMAX) 70 MG tablet TAKE 1 TABLET BY MOUTH ONCE A WEEK. TAKE WITH A FULL GLASS OF WATER ON AN EMPTY STOMACH. 02/11/23   Rice, Jamesetta Orleans, MD  Black Cohosh 40 MG CAPS Take 1 capsule (40 mg total) by mouth daily. 05/25/21   Constant, Peggy, MD  Budeson-Glycopyrrol-Formoterol (BREZTRI AEROSPHERE) 160-9-4.8 MCG/ACT AERO Inhale 2 puffs into the lungs 2 (two) times daily. 02/10/23   Tower, Audrie Gallus, MD  buPROPion (WELLBUTRIN XL) 150 MG 24 hr tablet Take 150 mg by mouth daily.    [provider]  busPIRone (BUSPAR) 15 MG tablet Take 15 mg by mouth 2 (two) times daily before a meal.    [provider]  cholecalciferol (VITAMIN D) 25 MCG (1000 UNIT) tablet Take 2,000 Units by mouth daily. 2 tablets daily    [provider]  clobetasol cream (TEMOVATE) 0.05 % APPLY SMALL AMOUNT TO AFFECTED AREAS TWO TO THREE TIMES PER WEEK 02/08/23   Tower, Audrie Gallus, MD  cyanocobalamin  2000 MCG tablet Take 500 mcg by mouth daily.    [provider]  diclofenac Sodium (VOLTAREN) 1 % GEL Apply 2 g topically 2 (two) times daily as needed (pain). 09/07/20   Lynn Ito, MD  diphenhydrAMINE (BENADRYL) 25 MG tablet Take 25 mg by mouth every 6 (six) hours as needed for allergies.    [provider]  divalproex (DEPAKOTE ER) 500 MG 24 hr tablet Take 500 mg by mouth at bedtime.  11/02/19   [provider]  etanercept (ENBREL SURECLICK) 50 MG/ML injection Inject 50 mg into the skin once a week. 04/22/23   Fuller Plan, MD  fluticasone (FLONASE) 50 MCG/ACT nasal spray PLACE 2 SPRAYS INTO THE NOSE DAILY. 12/19/21   Waymon Budge, MD  HYDROcodone-acetaminophen (NORCO/VICODIN) 5-325 MG tablet Take 1 tablet by mouth 3 (three) times daily as needed for moderate pain. 11/20/22   [name], MD  ipratropium (ATROVENT) 0.06 % nasal spray Place 2 sprays into both nostrils 4 (four) times daily. Patient states she uses 2 times per day.    [provider]  ipratropium-albuterol (DUONEB) 0.5-2.5 (3) MG/3ML SOLN Take 3 mLs by nebulization every 6 (six) hours as needed. 12/29/19   Waymon Budge, MD  ketotifen (ZADITOR) 0.025 % ophthalmic solution Place 1 drop into both eyes 2 (two) times daily as needed (allergies).    [provider]  levothyroxine (SYNTHROID) 50 MCG tablet TAKE 1  TABLET BY MOUTH DAILY BEFORE BREAKFAST 04/16/23   Tower, Audrie Gallus, MD  LORazepam (ATIVAN) 1 MG tablet Take 1 mg by mouth at bedtime as needed for anxiety or sleep. 06/28/20   [provider]  metoprolol succinate (TOPROL-XL) 25 MG 24 hr tablet Take 0.5 tablets (12.5 mg total) by mouth daily. 02/12/23   Tower, Audrie Gallus, MD  mirabegron ER (MYRBETRIQ) 25 MG TB24 tablet Take 1 tablet (25 mg total) by mouth in the morning and at bedtime. 04/29/22   Tower, Audrie Gallus, MD  mirtazapine (REMERON) 30 MG tablet Take 30 mg by mouth at bedtime.  07/22/15   [provider]   ondansetron (ZOFRAN-ODT) 4 MG disintegrating tablet Take 1 tablet (4 mg total) by mouth every 8 (eight) hours as needed for nausea or vomiting. 02/10/21   Tower, Audrie Gallus, MD  predniSONE (DELTASONE) 10 MG tablet TAKE 1 TABLET (10 MG TOTAL) BY MOUTH DAILY WITH BREAKFAST. 04/23/23   Rice, Jamesetta Orleans, MD  Rhubarb (ESTROVEN COMPLETE) 4 MG TABS Take 4 mg by mouth daily. 05/25/21   Constant, Peggy, MD  sertraline (ZOLOFT) 100 MG tablet Take 100 mg by mouth daily.  12/06/14   [provider]  simvastatin (ZOCOR) 20 MG tablet TAKE 1 TABLET BY MOUTH AT BEDTIME 04/29/23   Tower, Audrie Gallus, MD  Spacer/Aero-Holding Chambers (AEROCHAMBER PLUS) inhaler Use with inhaler 06/29/21   Domenick Gong, MD  tirzepatide [facility]) 2.5 MG/0.5ML injection vial Inject 2.5 mg into the skin once a week. 03/24/23   Tower, Audrie Gallus, MD      Allergies    Lithium; Tegretol [carbamazepine]; Tricyclic antidepressants; Methotrexate derivatives; Strawberry extract; Tapentadol; Codeine; Cymbalta [duloxetine hcl]; Erythromycin; Lyrica [pregabalin]; Neurontin [gabapentin]; Rabeprazole sodium; Baclofen; Duraprep [antiseptic products, misc.]; Penicillins; and Tape    Review of Systems   Review of Systems  Unable to perform ROS: Acuity of condition    Physical Exam Updated Vital Signs BP (!) 209/85   Pulse (!) 117   Temp 100 F (37.8 C) (Axillary)   Resp (!) 34   SpO2 100%  Physical Exam Vitals and nursing note reviewed.  Constitutional:      General: She is in acute distress.     Appearance: She is ill-appearing.     Comments: Opens eyes to name, answers yes/no, at times confused, history limited with patient on BiPAP  HENT:     Head: Normocephalic.     Mouth/Throat:     Mouth: Mucous membranes are moist.  Eyes:     Pupils: Pupils are equal, round, and reactive to light.  Cardiovascular:     Rate and Rhythm: Regular rhythm. Tachycardia present.  Pulmonary:     Effort: Respiratory distress present.      Comments: Diminished breath sounds at bases Abdominal:     Palpations: Abdomen is soft.     Tenderness: There is no abdominal tenderness.  Musculoskeletal:        General: Swelling present.  Skin:    General: Skin is warm.  Neurological:     Mental Status: She is oriented to person, place, and time. Mental status is at baseline.  Psychiatric:        Mood and Affect: Mood normal.     ED Results / Procedures / Treatments   Labs (all labs ordered are listed, but only abnormal results are displayed) Labs Reviewed  CBC WITH DIFFERENTIAL/PLATELET - Abnormal; Notable for the following components:      Result Value   MCV 104.4 (*)  RDW 16.9 (*)    nRBC 0.8 (*)    All other components within normal limits  I-STAT VENOUS BLOOD GAS, ED - Abnormal; Notable for the following components:   pH, Ven 7.248 (*)    pCO2, Ven 84.4 (*)    pO2, Ven 57 (*)    Bicarbonate 36.8 (*)    TCO2 39 (*)    Acid-Base Excess 6.0 (*)    All other components within normal limits  CULTURE, BLOOD (ROUTINE X 2)  CULTURE, BLOOD (ROUTINE X 2)  RESP PANEL BY RT-PCR (RSV, FLU A&B, COVID)  RVPGX2  COMPREHENSIVE METABOLIC PANEL  BRAIN NATRIURETIC PEPTIDE  URINALYSIS, ROUTINE W REFLEX MICROSCOPIC  LACTIC ACID, PLASMA  LACTIC ACID, PLASMA  AMMONIA  TROPONIN I (HIGH SENSITIVITY)    EKG EKG Interpretation Date/Time:  Saturday May 04 2023 21:28:26 EST Ventricular Rate:  119 PR Interval:  136 QRS Duration:  69 QT Interval:  290 QTC Calculation: 408 R Axis:   37  Text Interpretation: Sinus tachycardia Low voltage, precordial leads Nonspecific repol abnormality, lateral leads Confirmed by Coralee Pesa [phone]) on 05/04/2023 9:37:42 PM  Radiology DG Chest Portable 1 View Result Date: 05/04/2023 CLINICAL DATA:  Respiratory distress EXAM: PORTABLE CHEST 1 VIEW COMPARISON:  12/20/2022 FINDINGS: Lung volumes are slightly small burr symmetric and stable since prior examination. No focal pulmonary infiltrate. No  pneumothorax. Small left pleural effusion versus pleural thickening is unchanged. Progressive bilateral hilar enlargement is present which may relate to progressive changes of pulmonary arterial hypertension hilar adenopathy. Cardiac size within normal limits. Dorsal column stimulator lead noted. IMPRESSION: 1. Progressive bilateral hilar enlargement which may relate to progressive changes of pulmonary arterial hypertension or hilar adenopathy. This could be confirmed with contrast enhanced CT imaging. 2. Stable small left pleural effusion versus pleural thickening. Electronically Signed   By: Helyn Numbers M.D.   On: 05/04/2023 22:13    Procedures .Critical Care  Performed by: Rozelle Logan, DO Authorized by: Rozelle Logan, DO   Critical care provider statement:    Critical care time (minutes):  30   Critical care time was exclusive of:  Separately billable procedures and treating other patients   Critical care was necessary to treat or prevent imminent or life-threatening deterioration of the following conditions:  Respiratory failure   Critical care was time spent personally by me on the following activities:  Development of treatment plan with patient or surrogate, discussions with consultants, evaluation of patient's response to treatment, examination of patient, ordering and review of laboratory studies, ordering and review of radiographic studies, ordering and performing treatments and interventions, pulse oximetry, re-evaluation of patient's condition and review of old charts   I assumed direction of critical care for this patient from another provider in my specialty: no     Care discussed with: admitting provider       Medications Ordered in ED Medications  sodium chloride 0.9 % bolus 500 mL (500 mLs Intravenous New Bag/Given 05/04/23 2153)  labetalol (NORMODYNE) injection 10 mg (10 mg Intravenous Given 05/04/23 2205)    ED Course/ Medical Decision Making/ A&P                                  Medical Decision Making Amount and/or Complexity of Data Reviewed Labs: ordered. Radiology: ordered.  Risk Prescription drug management. Decision regarding hospitalization.   73 year old female presents to the emergency department with respiratory distress and altered  mental status.  Report from EMS is that she has had a couple days of respiratory symptoms.  Was given multiple DuoNebs prior to arrival.  On arrival patient is hypoxic with increased work of breathing, transition to BiPAP.  She appears fatigued and at times confused but answers to her name and otherwise follows commands.  Lung sounds are clear at this time.  Respiratory panel is negative.  Blood work shows baseline kidney function and slightly elevated troponin/BNP.  Chest x-ray shows no acute findings of CHF or pneumonia.  VBG is slightly acidotic with a pCO2 of 84, most likely the source of her change in mental status.  Ammonia is normal.  Patient became hypertensive with systolics over 200, dose labetalol given with great response.  Patient continues to be responsive to her name and following commands on BiPAP.  Will plan for admission for further care.  Patients evaluation and results requires admission for further treatment and care.  Spoke with hospitalist, reviewed patient's ED course and they accept admission.  Patient agrees with admission plan, offers no new complaints and is stable/unchanged at time of admit.        Final Clinical Impression(s) / ED Diagnoses Final diagnoses:  None    Rx / DC Orders ED Discharge Orders     None         Rozelle Logan, DO 05/04/23 2320

## 2023-05-04 NOTE — ED Triage Notes (Signed)
 Pt arrives EMS from home with reports of resp distress. Pt has been sick with flu like sx for three days. Pt altered from baseline, normally alert and oriented x4. Pt unable to speak in sentences and moving all around in bed. Pt was 50% on RA on EMS arrival. Pt received x3 duonebs.

## 2023-05-04 NOTE — Progress Notes (Signed)
   05/04/23 2128  BiPAP/CPAP/SIPAP  $ Non-Invasive Ventilator  Non-Invasive Vent Set Up;Non-Invasive Vent Initial  $ Face Mask Small Yes  BiPAP/CPAP/SIPAP Pt Type Adult  BiPAP/CPAP/SIPAP SERVO  Mask Type Full face mask  Mask Size Small  Respiratory Rate 37 breaths/min  IPAP 10 cmH20  EPAP 5 cmH2O  PEEP 5 cmH20  FiO2 (%) 100 %  Minute Ventilation 9  Leak 31  Peak Inspiratory Pressure (PIP) 10  Tidal Volume (Vt) 302  Patient Home Equipment No  Auto Titrate No

## 2023-05-05 ENCOUNTER — Inpatient Hospital Stay (HOSPITAL_COMMUNITY)

## 2023-05-05 ENCOUNTER — Encounter (HOSPITAL_COMMUNITY): Payer: Self-pay | Admitting: Family Medicine

## 2023-05-05 DIAGNOSIS — N1832 Chronic kidney disease, stage 3b: Secondary | ICD-10-CM | POA: Diagnosis present

## 2023-05-05 DIAGNOSIS — I160 Hypertensive urgency: Secondary | ICD-10-CM | POA: Diagnosis present

## 2023-05-05 DIAGNOSIS — J449 Chronic obstructive pulmonary disease, unspecified: Secondary | ICD-10-CM | POA: Diagnosis not present

## 2023-05-05 DIAGNOSIS — J441 Chronic obstructive pulmonary disease with (acute) exacerbation: Secondary | ICD-10-CM | POA: Diagnosis present

## 2023-05-05 DIAGNOSIS — J9690 Respiratory failure, unspecified, unspecified whether with hypoxia or hypercapnia: Secondary | ICD-10-CM | POA: Diagnosis present

## 2023-05-05 DIAGNOSIS — R7989 Other specified abnormal findings of blood chemistry: Secondary | ICD-10-CM | POA: Diagnosis present

## 2023-05-05 DIAGNOSIS — N1831 Chronic kidney disease, stage 3a: Secondary | ICD-10-CM

## 2023-05-05 DIAGNOSIS — I10 Essential (primary) hypertension: Secondary | ICD-10-CM

## 2023-05-05 DIAGNOSIS — E119 Type 2 diabetes mellitus without complications: Secondary | ICD-10-CM

## 2023-05-05 DIAGNOSIS — F418 Other specified anxiety disorders: Secondary | ICD-10-CM | POA: Diagnosis present

## 2023-05-05 DIAGNOSIS — J9602 Acute respiratory failure with hypercapnia: Secondary | ICD-10-CM | POA: Diagnosis not present

## 2023-05-05 DIAGNOSIS — J9601 Acute respiratory failure with hypoxia: Secondary | ICD-10-CM | POA: Diagnosis not present

## 2023-05-05 DIAGNOSIS — G9340 Encephalopathy, unspecified: Secondary | ICD-10-CM | POA: Diagnosis present

## 2023-05-05 LAB — CBC
HCT: 42.4 % (ref 36.0–46.0)
Hemoglobin: 12.6 g/dL (ref 12.0–15.0)
MCH: 31.6 pg (ref 26.0–34.0)
MCHC: 29.7 g/dL — ABNORMAL LOW (ref 30.0–36.0)
MCV: 106.3 fL — ABNORMAL HIGH (ref 80.0–100.0)
Platelets: 172 10*3/uL (ref 150–400)
RBC: 3.99 MIL/uL (ref 3.87–5.11)
RDW: 16.6 % — ABNORMAL HIGH (ref 11.5–15.5)
WBC: 6.9 10*3/uL (ref 4.0–10.5)
nRBC: 1.2 % — ABNORMAL HIGH (ref 0.0–0.2)

## 2023-05-05 LAB — BASIC METABOLIC PANEL
Anion gap: 9 (ref 5–15)
BUN: 21 mg/dL (ref 8–23)
CO2: 31 mmol/L (ref 22–32)
Calcium: 8.3 mg/dL — ABNORMAL LOW (ref 8.9–10.3)
Chloride: 99 mmol/L (ref 98–111)
Creatinine, Ser: 1.02 mg/dL — ABNORMAL HIGH (ref 0.44–1.00)
GFR, Estimated: 58 mL/min — ABNORMAL LOW (ref >60)
Glucose, Bld: 154 mg/dL — ABNORMAL HIGH (ref 70–99)
Potassium: 4.4 mmol/L (ref 3.5–5.1)
Sodium: 139 mmol/L (ref 135–145)

## 2023-05-05 LAB — I-STAT ARTERIAL BLOOD GAS, ED
Acid-Base Excess: 8 mmol/L — ABNORMAL HIGH (ref 0.0–2.0)
Bicarbonate: 34.7 mmol/L — ABNORMAL HIGH (ref 20.0–28.0)
Calcium, Ion: 1.17 mmol/L (ref 1.15–1.40)
HCT: 41 % (ref 36.0–46.0)
Hemoglobin: 13.9 g/dL (ref 12.0–15.0)
O2 Saturation: 92 %
Potassium: 4.4 mmol/L (ref 3.5–5.1)
Sodium: 138 mmol/L (ref 135–145)
TCO2: 36 mmol/L — ABNORMAL HIGH (ref 22–32)
pCO2 arterial: 54.1 mmHg — ABNORMAL HIGH (ref 32–48)
pH, Arterial: 7.416 (ref 7.35–7.45)
pO2, Arterial: 63 mmHg — ABNORMAL LOW (ref 83–108)

## 2023-05-05 LAB — URINALYSIS, ROUTINE W REFLEX MICROSCOPIC
Bilirubin Urine: NEGATIVE
Glucose, UA: NEGATIVE mg/dL
Hgb urine dipstick: NEGATIVE
Ketones, ur: NEGATIVE mg/dL
Leukocytes,Ua: NEGATIVE
Nitrite: NEGATIVE
Protein, ur: NEGATIVE mg/dL
Specific Gravity, Urine: 1.021 (ref 1.005–1.030)
pH: 5 (ref 5.0–8.0)

## 2023-05-05 LAB — HEMOGLOBIN A1C
Hgb A1c MFr Bld: 5.6 % (ref 4.8–5.6)
Mean Plasma Glucose: 114.02 mg/dL

## 2023-05-05 LAB — GLUCOSE, CAPILLARY
Glucose-Capillary: 204 mg/dL — ABNORMAL HIGH (ref 70–99)
Glucose-Capillary: 166 mg/dL — ABNORMAL HIGH (ref 70–99)
Glucose-Capillary: 146 mg/dL — ABNORMAL HIGH (ref 70–99)
Glucose-Capillary: 147 mg/dL — ABNORMAL HIGH (ref 70–99)

## 2023-05-05 LAB — BLOOD GAS, VENOUS
Acid-Base Excess: 9.1 mmol/L — ABNORMAL HIGH (ref 0.0–2.0)
Bicarbonate: 41.8 mmol/L — ABNORMAL HIGH (ref 20.0–28.0)
O2 Saturation: 73.8 %
Patient temperature: 37
pCO2, Ven: 112 mmHg (ref 44–60)
pH, Ven: 7.18 — CL (ref 7.25–7.43)
pO2, Ven: 52 mmHg — ABNORMAL HIGH (ref 32–45)

## 2023-05-05 LAB — MRSA NEXT GEN BY PCR, NASAL: MRSA by PCR Next Gen: NOT DETECTED

## 2023-05-05 LAB — TROPONIN I (HIGH SENSITIVITY)
Troponin I (High Sensitivity): 75 ng/L — ABNORMAL HIGH (ref <18)
Troponin I (High Sensitivity): 94 ng/L — ABNORMAL HIGH (ref <18)

## 2023-05-05 MED ORDER — ACETAMINOPHEN 650 MG RE SUPP: 650.0000 mg | Freq: Four times a day (QID) | RECTAL | Status: DC | PRN

## 2023-05-05 MED ORDER — HEPARIN (PORCINE) 25000 UT/250ML-% IV SOLN
1250.0000 [IU]/h | INTRAVENOUS | Status: DC
  Administered 2023-05-05: 1500 [IU]/h via INTRAVENOUS
  Administered 2023-05-06: 1250 [IU]/h via INTRAVENOUS
  Filled 2023-05-05 (×2): qty 250

## 2023-05-05 MED ORDER — FENTANYL CITRATE PF 50 MCG/ML IJ SOSY
25.0000 ug | PREFILLED_SYRINGE | Freq: Once | INTRAMUSCULAR | Status: AC
  Administered 2023-05-05: 25 ug via INTRAVENOUS
  Filled 2023-05-05: qty 1

## 2023-05-05 MED ORDER — CHLORHEXIDINE GLUCONATE CLOTH 2 % EX PADS
6.0000 | MEDICATED_PAD | Freq: Every day | CUTANEOUS | Status: DC
  Administered 2023-05-05 – 2023-05-11 (×7): 6 via TOPICAL

## 2023-05-05 MED ORDER — PREDNISONE 20 MG PO TABS: 40.0000 mg | ORAL_TABLET | Freq: Every day | ORAL | Status: DC

## 2023-05-05 MED ORDER — ENOXAPARIN SODIUM 40 MG/0.4ML IJ SOSY
40.0000 mg | PREFILLED_SYRINGE | INTRAMUSCULAR | Status: DC
  Administered 2023-05-05: 40 mg via SUBCUTANEOUS
  Filled 2023-05-05: qty 0.4

## 2023-05-05 MED ORDER — ETOMIDATE 2 MG/ML IV SOLN
INTRAVENOUS | Status: AC
  Administered 2023-05-05: 10 mg via INTRAVENOUS
  Filled 2023-05-05: qty 10

## 2023-05-05 MED ORDER — ARFORMOTEROL TARTRATE 15 MCG/2ML IN NEBU
15.0000 ug | INHALATION_SOLUTION | Freq: Two times a day (BID) | RESPIRATORY_TRACT | Status: DC
  Administered 2023-05-05 – 2023-05-11 (×13): 15 ug via RESPIRATORY_TRACT
  Filled 2023-05-05 (×12): qty 2

## 2023-05-05 MED ORDER — METOPROLOL TARTRATE 25 MG/10 ML ORAL SUSPENSION
6.2500 mg | Freq: Two times a day (BID) | ORAL | Status: DC
  Administered 2023-05-05 – 2023-05-07 (×5): 6.25 mg
  Filled 2023-05-05 (×6): qty 5

## 2023-05-05 MED ORDER — POLYETHYLENE GLYCOL 3350 17 G PO PACK
17.0000 g | PACK | Freq: Every day | ORAL | Status: DC
  Administered 2023-05-05 – 2023-05-07 (×3): 17 g
  Filled 2023-05-05 (×3): qty 1

## 2023-05-05 MED ORDER — ORAL CARE MOUTH RINSE: 15.0000 mL | OROMUCOSAL | Status: DC | PRN

## 2023-05-05 MED ORDER — ONDANSETRON HCL 4 MG PO TABS
4.0000 mg | ORAL_TABLET | Freq: Four times a day (QID) | ORAL | Status: DC | PRN
Start: 2023-05-05 — End: 2023-05-11
  Administered 2023-05-08: 4 mg via ORAL
  Filled 2023-05-05: qty 1

## 2023-05-05 MED ORDER — BUPROPION HCL ER (XL) 150 MG PO TB24: 150.0000 mg | ORAL_TABLET | Freq: Every day | ORAL | Status: DC

## 2023-05-05 MED ORDER — LEVOTHYROXINE SODIUM 25 MCG PO TABS
50.0000 ug | ORAL_TABLET | Freq: Every day | ORAL | Status: DC
  Administered 2023-05-06 – 2023-05-07 (×2): 50 ug
  Filled 2023-05-05 (×2): qty 2

## 2023-05-05 MED ORDER — SERTRALINE HCL 50 MG PO TABS
100.0000 mg | ORAL_TABLET | Freq: Every day | ORAL | Status: DC
  Administered 2023-05-05: 100 mg via ORAL
  Filled 2023-05-05: qty 2

## 2023-05-05 MED ORDER — FENTANYL CITRATE PF 50 MCG/ML IJ SOSY: 25.0000 ug | PREFILLED_SYRINGE | INTRAMUSCULAR | Status: DC | PRN

## 2023-05-05 MED ORDER — VALPROIC ACID 250 MG/5ML PO SOLN
250.0000 mg | Freq: Two times a day (BID) | ORAL | Status: DC
  Administered 2023-05-06 – 2023-05-07 (×3): 250 mg
  Filled 2023-05-05 (×3): qty 5

## 2023-05-05 MED ORDER — HYDRALAZINE HCL 20 MG/ML IJ SOLN
10.0000 mg | INTRAMUSCULAR | Status: DC | PRN
  Administered 2023-05-05 – 2023-05-06 (×4): 10 mg via INTRAVENOUS
  Filled 2023-05-05 (×4): qty 1

## 2023-05-05 MED ORDER — SODIUM CHLORIDE 0.9 % IV SOLN
1.0000 g | INTRAVENOUS | Status: AC
  Administered 2023-05-05 – 2023-05-09 (×5): 1 g via INTRAVENOUS
  Filled 2023-05-05 (×5): qty 10

## 2023-05-05 MED ORDER — POLYETHYLENE GLYCOL 3350 17 G PO PACK: 17.0000 g | PACK | Freq: Every day | ORAL | Status: DC | PRN

## 2023-05-05 MED ORDER — FENTANYL CITRATE PF 50 MCG/ML IJ SOSY
25.0000 ug | PREFILLED_SYRINGE | INTRAMUSCULAR | Status: DC | PRN
  Administered 2023-05-05 (×2): 50 ug via INTRAVENOUS
  Filled 2023-05-05: qty 1

## 2023-05-05 MED ORDER — ORAL CARE MOUTH RINSE
15.0000 mL | OROMUCOSAL | Status: DC
  Administered 2023-05-05 – 2023-05-07 (×23): 15 mL via OROMUCOSAL

## 2023-05-05 MED ORDER — MIDAZOLAM HCL 2 MG/2ML IJ SOLN
1.0000 mg | INTRAMUSCULAR | Status: DC | PRN
  Administered 2023-05-05 – 2023-05-07 (×6): 2 mg via INTRAVENOUS
  Filled 2023-05-05 (×6): qty 2

## 2023-05-05 MED ORDER — DEXMEDETOMIDINE HCL IN NACL 400 MCG/100ML IV SOLN
0.0000 ug/kg/h | INTRAVENOUS | Status: DC
Start: 2023-05-05 — End: 2023-05-08
  Administered 2023-05-05: 0.4 ug/kg/h via INTRAVENOUS
  Administered 2023-05-05: 0.9 ug/kg/h via INTRAVENOUS
  Administered 2023-05-05 (×2): 0.8 ug/kg/h via INTRAVENOUS
  Administered 2023-05-06 (×4): 1.2 ug/kg/h via INTRAVENOUS
  Administered 2023-05-06: 1.1 ug/kg/h via INTRAVENOUS
  Administered 2023-05-06 – 2023-05-07 (×4): 1.2 ug/kg/h via INTRAVENOUS
  Filled 2023-05-05 (×9): qty 100
  Filled 2023-05-05: qty 200
  Filled 2023-05-05 (×3): qty 100

## 2023-05-05 MED ORDER — INSULIN ASPART 100 UNIT/ML IJ SOLN
0.0000 [IU] | INTRAMUSCULAR | Status: DC
  Administered 2023-05-05: 3 [IU] via SUBCUTANEOUS
  Administered 2023-05-05: 2 [IU] via SUBCUTANEOUS
  Administered 2023-05-05 – 2023-05-06 (×6): 1 [IU] via SUBCUTANEOUS
  Administered 2023-05-06 (×2): 2 [IU] via SUBCUTANEOUS
  Administered 2023-05-07 (×2): 1 [IU] via SUBCUTANEOUS
  Administered 2023-05-07: 2 [IU] via SUBCUTANEOUS

## 2023-05-05 MED ORDER — MIRABEGRON ER 25 MG PO TB24
25.0000 mg | ORAL_TABLET | Freq: Every day | ORAL | Status: DC
  Administered 2023-05-05: 25 mg via ORAL
  Filled 2023-05-05: qty 1

## 2023-05-05 MED ORDER — ONDANSETRON HCL 4 MG/2ML IJ SOLN
4.0000 mg | Freq: Four times a day (QID) | INTRAMUSCULAR | Status: DC | PRN
  Administered 2023-05-08 – 2023-05-10 (×3): 4 mg via INTRAVENOUS
  Filled 2023-05-05 (×3): qty 2

## 2023-05-05 MED ORDER — ROCURONIUM BROMIDE 10 MG/ML (PF) SYRINGE: 50.0000 mg | PREFILLED_SYRINGE | Freq: Once | INTRAVENOUS | Status: AC

## 2023-05-05 MED ORDER — BUSPIRONE HCL 10 MG PO TABS: 15.0000 mg | ORAL_TABLET | Freq: Two times a day (BID) | ORAL | Status: DC

## 2023-05-05 MED ORDER — FENTANYL 2500MCG IN NS 250ML (10MCG/ML) PREMIX INFUSION
25.0000 ug/h | INTRAVENOUS | Status: DC
  Administered 2023-05-05: 200 ug/h via INTRAVENOUS
  Administered 2023-05-05: 25 ug/h via INTRAVENOUS
  Administered 2023-05-06 – 2023-05-07 (×3): 200 ug/h via INTRAVENOUS
  Filled 2023-05-05 (×5): qty 250

## 2023-05-05 MED ORDER — DIVALPROEX SODIUM ER 500 MG PO TB24: 500.0000 mg | ORAL_TABLET | Freq: Every day | ORAL | Status: DC

## 2023-05-05 MED ORDER — HEPARIN BOLUS VIA INFUSION
4000.0000 [IU] | Freq: Once | INTRAVENOUS | Status: AC
  Administered 2023-05-05: 4000 [IU] via INTRAVENOUS
  Filled 2023-05-05: qty 4000

## 2023-05-05 MED ORDER — MIRTAZAPINE 15 MG PO TABS: 30.0000 mg | ORAL_TABLET | Freq: Every day | ORAL | Status: DC

## 2023-05-05 MED ORDER — IPRATROPIUM-ALBUTEROL 0.5-2.5 (3) MG/3ML IN SOLN
3.0000 mL | Freq: Four times a day (QID) | RESPIRATORY_TRACT | Status: DC
  Administered 2023-05-05: 3 mL via RESPIRATORY_TRACT
  Filled 2023-05-05: qty 3

## 2023-05-05 MED ORDER — LORAZEPAM 1 MG PO TABS: 1.0000 mg | ORAL_TABLET | Freq: Every evening | ORAL | Status: DC | PRN

## 2023-05-05 MED ORDER — SIMVASTATIN 20 MG PO TABS: 20.0000 mg | ORAL_TABLET | Freq: Every day | ORAL | Status: DC

## 2023-05-05 MED ORDER — FAMOTIDINE IN NACL 20-0.9 MG/50ML-% IV SOLN: 20.0000 mg | Freq: Two times a day (BID) | INTRAVENOUS | Status: DC

## 2023-05-05 MED ORDER — BUDESONIDE 0.5 MG/2ML IN SUSP
0.5000 mg | Freq: Two times a day (BID) | RESPIRATORY_TRACT | Status: DC
  Administered 2023-05-05 – 2023-05-11 (×13): 0.5 mg via RESPIRATORY_TRACT
  Filled 2023-05-05 (×13): qty 2

## 2023-05-05 MED ORDER — SODIUM CHLORIDE 0.9% FLUSH
3.0000 mL | Freq: Two times a day (BID) | INTRAVENOUS | Status: DC
  Administered 2023-05-05 – 2023-05-11 (×14): 3 mL via INTRAVENOUS

## 2023-05-05 MED ORDER — VALPROIC ACID 250 MG/5ML PO SOLN
500.0000 mg | Freq: Three times a day (TID) | ORAL | Status: DC
Start: 2023-05-05 — End: 2023-05-05
  Administered 2023-05-05: 500 mg
  Filled 2023-05-05: qty 10

## 2023-05-05 MED ORDER — METHYLPREDNISOLONE SODIUM SUCC 125 MG IJ SOLR
125.0000 mg | Freq: Two times a day (BID) | INTRAMUSCULAR | Status: DC
  Administered 2023-05-05: 125 mg via INTRAVENOUS
  Filled 2023-05-05: qty 2

## 2023-05-05 MED ORDER — ROCURONIUM BROMIDE 10 MG/ML (PF) SYRINGE
PREFILLED_SYRINGE | INTRAVENOUS | Status: AC
  Administered 2023-05-05: 50 mg via INTRAVENOUS
  Filled 2023-05-05: qty 10

## 2023-05-05 MED ORDER — SERTRALINE HCL 50 MG PO TABS
100.0000 mg | ORAL_TABLET | Freq: Every day | ORAL | Status: DC
  Administered 2023-05-06 – 2023-05-07 (×2): 100 mg
  Filled 2023-05-05 (×2): qty 2

## 2023-05-05 MED ORDER — IOHEXOL 350 MG/ML SOLN
75.0000 mL | Freq: Once | INTRAVENOUS | Status: AC | PRN
  Administered 2023-05-05: 75 mL via INTRAVENOUS

## 2023-05-05 MED ORDER — FAMOTIDINE 20 MG PO TABS
20.0000 mg | ORAL_TABLET | Freq: Two times a day (BID) | ORAL | Status: DC
  Administered 2023-05-05 – 2023-05-07 (×5): 20 mg
  Filled 2023-05-05 (×5): qty 1

## 2023-05-05 MED ORDER — METHYLPREDNISOLONE SODIUM SUCC 125 MG IJ SOLR
125.0000 mg | Freq: Two times a day (BID) | INTRAMUSCULAR | Status: AC
  Administered 2023-05-05: 125 mg via INTRAVENOUS
  Filled 2023-05-05: qty 2

## 2023-05-05 MED ORDER — LEVOTHYROXINE SODIUM 25 MCG PO TABS: 50.0000 ug | ORAL_TABLET | Freq: Every day | ORAL | Status: DC

## 2023-05-05 MED ORDER — BUDESONIDE 0.25 MG/2ML IN SUSP: 0.2500 mg | Freq: Two times a day (BID) | RESPIRATORY_TRACT | Status: DC

## 2023-05-05 MED ORDER — DOCUSATE SODIUM 50 MG/5ML PO LIQD
100.0000 mg | Freq: Two times a day (BID) | ORAL | Status: DC
  Administered 2023-05-05 – 2023-05-07 (×5): 100 mg
  Filled 2023-05-05 (×5): qty 10

## 2023-05-05 MED ORDER — REVEFENACIN 175 MCG/3ML IN SOLN
175.0000 ug | Freq: Every day | RESPIRATORY_TRACT | Status: DC
  Administered 2023-05-05 – 2023-05-11 (×7): 175 ug via RESPIRATORY_TRACT
  Filled 2023-05-05 (×7): qty 3

## 2023-05-05 MED ORDER — ETOMIDATE 2 MG/ML IV SOLN: 10.0000 mg | Freq: Once | INTRAVENOUS | Status: AC

## 2023-05-05 MED ORDER — ALBUTEROL SULFATE (2.5 MG/3ML) 0.083% IN NEBU
2.5000 mg | INHALATION_SOLUTION | RESPIRATORY_TRACT | Status: DC | PRN
  Administered 2023-05-11: 2.5 mg via RESPIRATORY_TRACT
  Filled 2023-05-05: qty 3

## 2023-05-05 MED ORDER — METOPROLOL SUCCINATE 12.5 MG HALF TABLET
12.5000 mg | ORAL_TABLET | Freq: Every day | ORAL | Status: DC
  Filled 2023-05-05: qty 1

## 2023-05-05 MED ORDER — ACETAMINOPHEN 325 MG PO TABS
650.0000 mg | ORAL_TABLET | Freq: Four times a day (QID) | ORAL | Status: DC | PRN
  Administered 2023-05-08 (×2): 650 mg via ORAL
  Filled 2023-05-05 (×2): qty 2

## 2023-05-05 MED ORDER — PREDNISONE 20 MG PO TABS
40.0000 mg | ORAL_TABLET | Freq: Every day | ORAL | Status: DC
  Administered 2023-05-06 – 2023-05-07 (×2): 40 mg
  Filled 2023-05-05 (×2): qty 2

## 2023-05-05 MED ORDER — LABETALOL HCL 5 MG/ML IV SOLN: 10.0000 mg | INTRAVENOUS | Status: DC | PRN

## 2023-05-05 MED ORDER — LABETALOL HCL 5 MG/ML IV SOLN
10.0000 mg | INTRAVENOUS | Status: DC | PRN
Start: 2023-05-05 — End: 2023-05-09
  Administered 2023-05-05 – 2023-05-08 (×2): 10 mg via INTRAVENOUS
  Filled 2023-05-05: qty 4

## 2023-05-05 MED ORDER — DOCUSATE SODIUM 100 MG PO CAPS: 100.0000 mg | ORAL_CAPSULE | Freq: Two times a day (BID) | ORAL | Status: DC | PRN

## 2023-05-05 MED ORDER — SIMVASTATIN 20 MG PO TABS
20.0000 mg | ORAL_TABLET | Freq: Every day | ORAL | Status: DC
  Administered 2023-05-05 – 2023-05-06 (×2): 20 mg
  Filled 2023-05-05 (×3): qty 1

## 2023-05-05 NOTE — ED Notes (Signed)
 While receiving report, RN Abby stated that patient was on fentanyl drip at 59mcg/hr. She then stated that patient was fully awake, reaching and looking around. Based on desired sedation goal per order, it seemed as if sedation needed upward titration. We then heard vent alarming, we walked into room to find patient on side, leg thrown over bed and vent disconnected. I immediately gave bolus of fentanyl and titrated sedation up, and wrist cuffs applied. Provider notified of need for restraint order and of extra sedation

## 2023-05-05 NOTE — Progress Notes (Signed)
 PHARMACY - ANTICOAGULATION CONSULT NOTE  Pharmacy Consult for heparin Indication: pulmonary embolus  Allergies  Allergen Reactions   Lithium Anaphylaxis   Tegretol [Carbamazepine] Other (See Comments)    Fever and body aches (fever over 103)   Tricyclic Antidepressants Anaphylaxis    Other reaction(s): Unknown   Methotrexate Derivatives Other (See Comments)    Urinary retention and dizziness  Other reaction(s): Other UTI   Strawberry Extract Hives   Tapentadol Rash    PT ALLERGIC NYLON TAPE    Codeine Nausea Only    Makes pt stay awake   Cymbalta [Duloxetine Hcl] Other (See Comments)    Makes pt pass out    Erythromycin     abdominal pain   Lyrica [Pregabalin]     Felt faint   Neurontin [Gabapentin]     Passes  out   Rabeprazole Sodium     insomnia   Baclofen Nausea Only    Sever nausea   Duraprep [Antiseptic Products, Misc.] Itching and Rash    Tolerates Betadine    Penicillins Rash    Tolerated ANCEF 02/02/20  Has patient had a PCN reaction causing immediate rash, facial/tongue/throat swelling, SOB or lightheadedness with hypotension: Yes Has patient had a PCN reaction causing severe rash involving mucus membranes or skin necrosis: No Has patient had a PCN reaction that required hospitalization No Has patient had a PCN reaction occurring within the last 10 years: No If all of the above answers are "NO", then may proceed with Cephalosporin use.    Tape Rash    PT ALLERGIC NYLON TAPE     Patient Measurements: Height: 5' 7.01" (170.2 cm) Weight: 101.7 kg (224 lb 3.3 oz) IBW/kg (Calculated) : 61.62 Heparin Dosing Weight: 84.4 kg  Vital Signs: Temp: 98 F (36.7 C) (03/09 1600) Temp Source: Oral (03/09 1600) BP: 198/79 (03/09 1856) Pulse Rate: 66 (03/09 1856)  Labs: Recent Labs    05/04/23 2137 05/04/23 2146 05/04/23 2325 05/04/23 2339 05/05/23 0427 05/05/23 0623  HGB 12.7   < >  --  13.3 12.6 13.9  HCT 42.3   < >  --  39.0 42.4 41.0  PLT 190  --    --   --  172  --   CREATININE 1.17*  --   --   --  1.02*  --   TROPONINIHS 54*  --  75*  --  94*  --    < > = values in this interval not displayed.    Estimated Creatinine Clearance: 61.1 mL/min (A) (by C-G formula based on SCr of 1.02 mg/dL (H)).   Medical History: Past Medical History:  Diagnosis Date   Allergy Hayfever   1958   Amaurosis fugax    Anemia    hx   Anxiety    Arthritis    Asthma    Cataract    Chronic fatigue syndrome    Chronic kidney disease    frequency, nephritis when 73 yrs old   COPD (chronic obstructive pulmonary disease) (HCC)    COVID-19    Depression    Diverticulitis    Emphysema of lung (HCC)    Fibromyalgia    GERD (gastroesophageal reflux disease)    occ   H/O hiatal hernia    History of bronchitis    Hyperlipidemia    Hypothyroidism    Interstitial cystitis    Irritable bowel syndrome    Lichen sclerosus    Lumbar herniated disc    Migraines    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 No oral anticoagulation reported prior to admission, however the patient did receive one dose of Lovenox 40mg  around 1100 this morning. CBC WNL. Pharmacy consulted to manage heparin infusion.    Goal of Therapy:  Heparin level 0.3-0.7 units/ml Heparin level 66-102 units/ml Monitor platelets by anticoagulation protocol: Yes   Plan:  Give 4000 units bolus x 1 Start heparin infusion at 1500 units/hr Check anti-Xa level in 8 hours and daily while on heparin Continue to monitor H&H and platelets  Ruben Im, PharmD Clinical Pharmacist 05/05/2023 7:35 PM Please check AMION for all [facility] Pharmacy numbers

## 2023-05-05 NOTE — ED Notes (Signed)
 RT and critical care at bedside to intubate pt.

## 2023-05-05 NOTE — Progress Notes (Signed)
 Pt was transported via ventilator with no apparent complications. Pt is now in room 65M13, 65M RT has been notified.

## 2023-05-05 NOTE — H&P (Addendum)
 History and Physical    Lori Orozco:[id number] DOB: 1950/04/04 DOA: 05/04/2023  PCP: Lori Pimple, MD   Patient coming from: Home   Chief Complaint: respiratory distress, disoriented  HPI: Lori Orozco is a 73 y.o. female with medical history significant for depression, anxiety, hypothyroidism, hypertension, CKD 3A, and emphysema who presents with respiratory failure and altered mental status.  EMS was called by the patient's sister who noted that the patient had been experiencing a flulike illness for 3 days, had loss of appetite, and appeared short of breath.  Patient was disoriented and hypoxic with EMS.  She was placed on supplemental oxygen and treated with DuoNeb x 3 prior to arrival in the ED.   ED Course: Upon arrival to the ED, patient is found to be afebrile and saturating well on BiPAP with elevated RR, elevated HR, and elevated BP.  Labs are most notable for creatinine 1.17, normal WBC, normal lactic acid, troponin of 54, BNP 204, negative respiratory virus panel, and pCO2 84.  Blood cultures were collected in the ED, patient was placed on BiPAP, and was treated with 500 mL of NS and 10 mg IV labetalol.  Review of Systems:  ROS limited by patient's clinical condition.  Past Medical History:  Diagnosis Date   Allergy Hayfever   1958   Amaurosis fugax    Anemia    hx   Anxiety    Arthritis    Asthma    Cataract    Chronic fatigue syndrome    Chronic kidney disease    frequency, nephritis when 73 yrs old   COPD (chronic obstructive pulmonary disease) (HCC)    COVID-19    Depression    Diverticulitis    Emphysema of lung (HCC)    Fibromyalgia    GERD (gastroesophageal reflux disease)    occ   H/O hiatal hernia    History of bronchitis    Hyperlipidemia    Hypothyroidism    Interstitial cystitis    Irritable bowel syndrome    Lichen sclerosus    Lumbar herniated disc    Migraines    Motor nerve conduction block 11/2021   Osteoporosis  09/10/2022   Pneumonia    PONV (postoperative nausea and vomiting)    Shortness of breath    exertion    Thyroid disease    Graves   Urinary frequency    Urinary tract infection    Vertigo     Past Surgical History:  Procedure Laterality Date   ABDOMINAL HYSTERECTOMY     bladder sugery      CAROTID DOPPLAR     COLONOSCOPY  05/26/2010   [facility]- otherwise nl , re check 10y   DEXA-OSTEOPENIA     DOPPLER ECHOCARDIOGRAPHY     elbow surgery     EPICONDYLITIS     EYE SURGERY Bilateral    cataracts   FOOT SURGERY Bilateral    I & D EXTREMITY Right 09/01/2020   Procedure: TYNOSYNOVECTOMY;  Surgeon: [name], MD;  Location: [facility];  Service: Orthopedics;  Laterality: Right;   IMPLANTATION VAGAL NERVE STIMULATOR  03/2022   JOINT REPLACEMENT     KNEE SURGERY     Left cartilage   lipoma in second finger right hand     MOUTH SURGERY  08/22/2022   PLANTAR FASCIA SURGERY Left    REVERSE SHOULDER ARTHROPLASTY Left 02/02/2020   Procedure: LEFT REVERSE SHOULDER ARTHROPLASTY;  Surgeon: [name], MD;  Location: [facility];  Service: Orthopedics;  Laterality: Left;   ROTATOR CUFF REPAIR Left 12/2017   TEMPOROMANDIBULAR JOINT SURGERY     TONSILLECTOMY     TOTAL HIP ARTHROPLASTY Right 03/20/2013   Procedure: Right TOTAL HIP ARTHROPLASTY;  Surgeon: [name], MD;  Location: [facility];  Service: Orthopedics;  Laterality: Right;  Right Total Hip Arthroplasty   TOTAL KNEE ARTHROPLASTY Left 01/28/2015   Procedure: LEFT TOTAL KNEE ARTHROPLASTY;  Surgeon: [name], MD;  Location: [facility];  Service: Orthopedics;  Laterality: Left;   TRIGGER FINGER RELEASE Right    TROCHANTERIC BURSA EXCISION Right 05/03/2016   Dr. Doneen Poisson   WRIST ARTHROSCOPY Right    ligament tear    Social History:   reports that she quit smoking about 10 years ago. Her smoking use included cigarettes. She started smoking about 53 years ago. She has a 86 pack-year smoking history. She  has never been exposed to tobacco smoke. She has never used smokeless tobacco. She reports that she does not drink alcohol and does not use drugs.  Allergies  Allergen Reactions   Lithium Anaphylaxis   Tegretol [Carbamazepine] Other (See Comments)    Fever and body aches (fever over 103)   Tricyclic Antidepressants Anaphylaxis    Other reaction(s): Unknown   Methotrexate Derivatives Other (See Comments)    Urinary retention and dizziness  Other reaction(s): Other UTI   Strawberry Extract Hives   Tapentadol Rash    PT ALLERGIC NYLON TAPE    Codeine Nausea Only    Makes pt stay awake   Cymbalta [Duloxetine Hcl] Other (See Comments)    Makes pt pass out    Erythromycin     abdominal pain   Lyrica [Pregabalin]     Felt faint   Neurontin [Gabapentin]     Passes  out   Rabeprazole Sodium     insomnia   Baclofen Nausea Only    Sever nausea   Duraprep [Antiseptic Products, Misc.] Itching and Rash    Tolerates Betadine    Penicillins Rash    Tolerated ANCEF 02/02/20  Has patient had a PCN reaction causing immediate rash, facial/tongue/throat swelling, SOB [facility] lightheadedness with hypotension: Yes Has patient had a PCN reaction causing severe rash involving mucus membranes [facility] skin necrosis: No Has patient had a PCN reaction that required hospitalization No Has patient had a PCN reaction occurring within the last 10 years: No If all of the above answers are "NO", then may proceed with Cephalosporin use.    Tape Rash    PT ALLERGIC NYLON TAPE     Family History  Problem Relation Age of Onset   Arthritis Mother    Hypertension Mother    Kidney disease Mother    Heart disease Mother    Stroke Mother    Cancer Father        Bladder   Arthritis Sister    Heart disease Sister    Colon cancer Other    Cancer - Lung Cousin    Arthritis Maternal Grandmother    Hearing loss Maternal Grandfather    Varicose Veins Maternal Uncle      Prior to Admission medications    Medication Sig Start Date End Date Taking? Authorizing Provider  acetaminophen (TYLENOL) 500 MG tablet Take 500 mg by mouth every 6 (six) hours as needed.   Yes [provider]  alendronate (FOSAMAX) 70 MG tablet TAKE 1 TABLET BY MOUTH ONCE A WEEK. TAKE WITH A FULL GLASS OF WATER ON AN  EMPTY STOMACH. 02/11/23  Yes Rice, Lori Orleans, MD  Black Cohosh 40 MG CAPS Take 1 capsule (40 mg total) by mouth daily. 05/25/21  Yes Constant, Peggy, MD  Budeson-Glycopyrrol-Formoterol (BREZTRI AEROSPHERE) 160-9-4.8 MCG/ACT AERO Inhale 2 puffs into the lungs 2 (two) times daily. 02/10/23  Yes Tower, Audrie Gallus, MD  buPROPion (WELLBUTRIN XL) 150 MG 24 hr tablet Take 150 mg by mouth daily.   Yes [provider]  busPIRone (BUSPAR) 15 MG tablet Take 15 mg by mouth 2 (two) times daily before a meal.   Yes [provider]  cholecalciferol (VITAMIN D) 25 MCG (1000 UNIT) tablet Take 2,000 Units by mouth daily. 2 tablets daily   Yes [provider]  clobetasol cream (TEMOVATE) 0.05 % APPLY SMALL AMOUNT TO AFFECTED AREAS TWO TO THREE TIMES PER WEEK 02/08/23  Yes Tower, Audrie Gallus, MD  cyanocobalamin 2000 MCG tablet Take 500 mcg by mouth daily.   Yes [provider]  diclofenac Sodium (VOLTAREN) 1 % GEL Apply 2 g topically 2 (two) times daily as needed (pain). 09/07/20  Yes Lynn Ito, MD  divalproex (DEPAKOTE ER) 500 MG 24 hr tablet Take 500 mg by mouth at bedtime.  11/02/19  Yes [provider]  etanercept (ENBREL SURECLICK) 50 MG/ML injection Inject 50 mg into the skin once a week. 04/22/23  Yes Rice, Lori [facility], MD  fluticasone (FLONASE) 50 MCG/ACT nasal spray PLACE 2 SPRAYS INTO THE NOSE DAILY. 12/19/21  Yes Young, Joni Fears D, MD  HYDROcodone-acetaminophen (NORCO/VICODIN) 5-325 MG tablet Take 1 tablet by mouth 3 (three) times daily as needed for moderate pain. 11/20/22  Yes [name], MD  hydrocortisone cream 1 % Apply 1 Application topically 2 (two) times  daily as needed for itching.   Yes [provider]  ibuprofen (ADVIL) 200 MG tablet Take 200 mg by mouth every 6 (six) hours as needed.   Yes [provider]  ipratropium (ATROVENT) 0.06 % nasal spray Place 2 sprays into both nostrils 4 (four) times daily. Patient states she uses 2 times per day.   Yes [provider]  ketotifen (ZADITOR) 0.025 % ophthalmic solution Place 1 drop into both eyes 2 (two) times daily as needed (allergies).   Yes [provider]  levothyroxine (SYNTHROID) 50 MCG tablet TAKE 1 TABLET BY MOUTH DAILY BEFORE BREAKFAST 04/16/23  Yes Tower, Marne A, MD  LORazepam (ATIVAN) 1 MG tablet Take 1 mg by mouth at bedtime as needed for anxiety [facility] sleep. 06/28/20  Yes [provider]  metoprolol succinate (TOPROL-XL) 25 MG 24 hr tablet Take 0.5 tablets (12.5 mg total) by mouth daily. 02/12/23  Yes Tower, Audrie Gallus, MD  mirabegron ER (MYRBETRIQ) 25 MG TB24 tablet Take 1 tablet (25 mg total) by mouth in the morning and at bedtime. 04/29/22  Yes Tower, Audrie Gallus, MD  mirtazapine (REMERON) 30 MG tablet Take 30 mg by mouth at bedtime.  07/22/15  Yes [provider]  ondansetron (ZOFRAN-ODT) 4 MG disintegrating tablet Take 1 tablet (4 mg total) by mouth every 8 (eight) hours as needed for nausea [facility] vomiting. 02/10/21  Yes Tower, Audrie Gallus, MD  predniSONE (DELTASONE) 10 MG tablet TAKE 1 TABLET (10 MG TOTAL) BY MOUTH DAILY WITH BREAKFAST. 04/23/23  Yes Rice, Lori [facility], MD  Rhubarb (ESTROVEN COMPLETE) 4 MG TABS Take 4 mg by mouth daily. 05/25/21  Yes Constant, Peggy, MD  sertraline (ZOLOFT) 100 MG tablet Take 100 mg by mouth daily.  12/06/14  Yes [provider]  simvastatin (ZOCOR) 20 MG tablet TAKE 1 TABLET BY MOUTH AT BEDTIME 04/29/23  Yes Tower, Audrie Gallus, MD  Spacer/Aero-Holding Chambers (AEROCHAMBER PLUS) inhaler Use with inhaler 06/29/21  Yes Domenick Gong, MD  traMADol (ULTRAM) 50 MG tablet Take 50 mg by mouth every 6 (six) hours as needed  for moderate pain (pain score 4-6).   Yes [provider]  diphenhydrAMINE (BENADRYL) 25 MG tablet Take 25 mg by mouth every 6 (six) hours as needed for allergies. Patient not taking: Reported on 05/05/2023    [provider]  ipratropium-albuterol (DUONEB) 0.5-2.5 (3) MG/3ML SOLN Take 3 mLs by nebulization every 6 (six) hours as needed. Patient not taking: Reported on 05/05/2023 12/29/19   Jetty Duhamel D, MD  tirzepatide [facility]) 2.5 MG/0.5ML injection vial Inject 2.5 mg into the skin once a week. Patient not taking: Reported on 05/05/2023 03/24/23   Lori Pimple, MD    Physical Exam: Vitals:   05/04/23 2245 05/04/23 2330 05/05/23 0100 05/05/23 0115  BP: (!) 171/72 (!) 150/63 (!) 146/58 (!) 150/76  Pulse: 89 88 89 92  Resp: (!) 27 (!) 37 (!) 24 (!) 23  Temp:      TempSrc:      SpO2: 98% 99% 100% 100%    Constitutional: Labored respirations, no diaphoresis   Eyes: PERTLA, lids and conjunctivae normal ENMT: Mucous membranes are moist. Posterior pharynx clear of any exudate [facility] lesions.   Neck: supple, no masses  Respiratory: Markedly diminished breath sounds, prolonged expiratory phase. Tachypnea.   Cardiovascular: Rate ~120 and regular. No JVD.   Abdomen: No tenderness, soft. Bowel sounds active.  Musculoskeletal: no clubbing / cyanosis. No joint deformity upper and lower extremities.   Skin: no significant rashes, lesions, ulcers. Warm, dry, well-perfused. Neurologic: CN 2-12 grossly intact. Moving all extremities. Wakes to voice and oriented to person and place.    Labs and Imaging on Admission: I have personally reviewed following labs and imaging studies  CBC: Recent Labs  Lab 05/04/23 2137 05/04/23 2146 05/04/23 2339  WBC 7.2  --   --   NEUTROABS 2.6  --   --   HGB 12.7 13.9 13.3  HCT 42.3 41.0 39.0  MCV 104.4*  --   --   PLT 190  --   --    Basic Metabolic Panel: Recent Labs  Lab 05/04/23 2137 05/04/23 2146 05/04/23 2339  NA 143 139 139  K  4.2 4.0 4.1  CL 96*  --   --   CO2 32  --   --   GLUCOSE 125*  --   --   BUN 24*  --   --   CREATININE 1.17*  --   --   CALCIUM 9.1  --   --    GFR: CrCl cannot be calculated (Unknown ideal weight.). Liver Function Tests: Recent Labs  Lab 05/04/23 2137  AST 37  ALT 28  ALKPHOS 40  BILITOT 0.9  PROT 6.5  ALBUMIN 3.2*   No results for input(s): "LIPASE", "AMYLASE" in the last 168 hours. Recent Labs  Lab 05/04/23 2138  AMMONIA 35   Coagulation Profile: No results for input(s): "INR", "PROTIME" in the last 168 hours. Cardiac Enzymes: No results for input(s): "CKTOTAL", "CKMB", "CKMBINDEX", "TROPONINI" in the last 168 hours. BNP (last 3 results) No results for input(s): "PROBNP" in the last 8760 hours. HbA1C: No results for input(s): "HGBA1C" in the last 72 hours. CBG: No results for input(s): "GLUCAP" in the last 168  hours. Lipid Profile: No results for input(s): "CHOL", "HDL", "LDLCALC", "TRIG", "CHOLHDL", "LDLDIRECT" in the last 72 hours. Thyroid Function Tests: No results for input(s): "TSH", "T4TOTAL", "FREET4", "T3FREE", "THYROIDAB" in the last 72 hours. Anemia Panel: No results for input(s): "VITAMINB12", "FOLATE", "FERRITIN", "TIBC", "IRON", "RETICCTPCT" in the last 72 hours. Urine analysis:    Component Value Date/Time   COLORURINE YELLOW 08/29/2020 0427   APPEARANCEUR CLEAR 08/29/2020 0427   LABSPEC 1.028 08/29/2020 0427   PHURINE 5.0 08/29/2020 0427   GLUCOSEU NEGATIVE 08/29/2020 0427   HGBUR NEGATIVE 08/29/2020 0427   BILIRUBINUR 1 mg/dL 16/11/9602 5409   KETONESUR 5 (A) 08/29/2020 0427   PROTEINUR Negative 10/19/2022 1524   PROTEINUR NEGATIVE 08/29/2020 0427   UROBILINOGEN 0.2 10/19/2022 1524   NITRITE Negative 10/19/2022 1524   NITRITE NEGATIVE 08/29/2020 0427   LEUKOCYTESUR Moderate (2+) (A) 10/19/2022 1524   LEUKOCYTESUR NEGATIVE 08/29/2020 0427   Sepsis Labs: @LABRCNTIP (procalcitonin:4,lacticidven:4) ) Recent Results (from the past 240  hours)  Resp panel by RT-PCR (RSV, Flu A&B, Covid) Anterior Nasal Swab     Status: None   Collection Time: 05/04/23  9:38 PM   Specimen: Anterior Nasal Swab  Result Value Ref Range Status   SARS Coronavirus 2 by RT PCR NEGATIVE NEGATIVE Final   Influenza A by PCR NEGATIVE NEGATIVE Final   Influenza B by PCR NEGATIVE NEGATIVE Final    Comment: (NOTE) The Xpert Xpress SARS-CoV-2/FLU/RSV plus assay is intended as an aid in the diagnosis of influenza from Nasopharyngeal swab specimens and should not be used as a sole basis for treatment. Nasal washings and aspirates are unacceptable for Xpert Xpress SARS-CoV-2/FLU/RSV testing.  Fact Sheet for Patients: BloggerCourse.com  Fact Sheet for Healthcare Providers: SeriousBroker.it  This test is not yet approved [facility] cleared by the [place] FDA and has been authorized for detection and/[facility] diagnosis of SARS-CoV-2 by FDA under an Emergency Use Authorization (EUA). This EUA will remain in effect (meaning this test can be used) for the duration of the COVID-19 declaration under Section 564(b)(1) of the Act, 21 U.S.C. section 360bbb-3(b)(1), unless the authorization is terminated [facility] revoked.     Resp Syncytial Virus by PCR NEGATIVE NEGATIVE Final    Comment: (NOTE) Fact Sheet for Patients: BloggerCourse.com  Fact Sheet for Healthcare Providers: SeriousBroker.it  This test is not yet approved [facility] cleared by the [place] FDA and has been authorized for detection and/[facility] diagnosis of SARS-CoV-2 by FDA under an Emergency Use Authorization (EUA). This EUA will remain in effect (meaning this test can be used) for the duration of the COVID-19 declaration under Section 564(b)(1) of the Act, 21 U.S.C. section 360bbb-3(b)(1), unless the authorization is terminated [facility] revoked.  Performed at [facility], [address], [place],  [place] [address]      Radiological Exams on Admission: DG Chest Portable 1 View Result Date: 05/04/2023 CLINICAL DATA:  Respiratory distress EXAM: PORTABLE CHEST 1 VIEW COMPARISON:  12/20/2022 FINDINGS: Lung volumes are slightly small burr symmetric and stable since prior examination. No focal pulmonary infiltrate. No pneumothorax. Small left pleural effusion versus pleural thickening is unchanged. Progressive bilateral hilar enlargement is present which may relate to progressive changes of pulmonary arterial hypertension hilar adenopathy. Cardiac size within normal limits. Dorsal column stimulator lead noted. IMPRESSION: 1. Progressive bilateral hilar enlargement which may relate to progressive changes of pulmonary arterial hypertension [facility] hilar adenopathy. This could be confirmed with contrast enhanced CT imaging. 2. Stable small left pleural effusion versus pleural thickening. Electronically Signed  By: Helyn Numbers M.D.   On: 05/04/2023 22:13    EKG: Independently reviewed. Sinus tachycardia.   Assessment/Plan   1. COPD exacerbation; acute hypoxic and hypercapneic respiratory failure  - Culture sputum, start systemic steroid and antibiotic, schedule DuoNebs, use additional albuterol as-needed, continue BiPAP for now, repeat blood gas, check CTA chest    Addendum (04:57): Blood gas repeated and shows increased pCO2 and decreased pH despite BiPAP and above treatments. She is also more difficult to wake. I'm concerned she needs to be intubated and spoke with Dr. Sherryll Burger of [facility] who kindly agreed to see her.    2. Elevated troponin  - Denies chest pain   - Trend troponin, check CTA chest, treat underlying respiratory illness    3. Hypertensive urgency  - Continue to treat as-needed for now, resume metoprolol once off of BiPAP   4. Acute encephalopathy  - Anticipate improvement with treatment of hypercarbia, will need to expand workup if mental status fails to improve with BiPAP as expected      5. CKD 3A  - Appears close to baseline  - Renally-dose medications  6. Depression, anxiety  - Resume home regimen once off of BiPAP  7. RA - Managed with Enbrel   8. Hypothyroidism  - Synthroid   DVT prophylaxis: Lovenox  Code Status: Full  Level of Care: Level of care: Progressive Family Communication: Sister updated in ED   Disposition Plan:  Patient is from: Home  Anticipated d/c is to: TBD Anticipated d/c date is: 05/08/23  Patient currently: Pending improved respiratory and mental status  Consults called: None  Admission status: Inpatient     Briscoe Deutscher, MD [facility]  05/05/2023, 3:06 AM

## 2023-05-05 NOTE — Consult Note (Addendum)
 NAME:  Lori Orozco, MRN:  [id number], DOB:  1950/05/10, LOS: 1 ADMISSION DATE:  05/04/2023, CONSULTATION DATE:  05/05/23 REFERRING MD:  opyd, CHIEF COMPLAINT:  Respiratory Failure   History of Present Illness:  Lori Orozco is a 73 y.o. F with PMH significant for COPD, former tobacco use, lung nodules (has seen Pulmonology, Dr. Maple Hudson), CKD 3a, DM, bipolar disorder, fibromyalgia who presented to the ED with three days of flu-like symptoms and confusion with loss of appetite.  She was placed on Bipap on arrival to the ED, work up was negative for Flu and Covid, no leukocytosis or lactic acidosis.  Despite Bipap she had worsening hypercarbic respiratory failure and mental status and required intubation  Pertinent  Medical History   has a past medical history of Allergy (Hayfever), Amaurosis fugax, Anemia, Anxiety, Arthritis, Asthma, Cataract, Chronic fatigue syndrome, Chronic kidney disease, COPD (chronic obstructive pulmonary disease) (HCC), COVID-19, Depression, Diverticulitis, Emphysema of lung (HCC), Fibromyalgia, GERD (gastroesophageal reflux disease), H/O hiatal hernia, History of bronchitis, Hyperlipidemia, Hypothyroidism, Interstitial cystitis, Irritable bowel syndrome, Lichen sclerosus, Lumbar herniated disc, Migraines, Motor nerve conduction block (11/2021), Osteoporosis (09/10/2022), Pneumonia, PONV (postoperative nausea and vomiting), Shortness of breath, Thyroid disease, Urinary frequency, Urinary tract infection, and Vertigo.   Significant Hospital Events: Including procedures, antibiotic start and stop dates in addition to other pertinent events   3/9 presented with hypercarbic respiratory failure, admit to [facility] then worsening hypercarbia and required intubation  Interim History / Subjective:  Intubated without difficulty in the ED  Objective   Blood pressure (!) 153/64, pulse 92, temperature 98.8 F (37.1 C), resp. rate (!) 27, height 5\' 7"  (1.702 m), weight 101.7 kg, SpO2  99%.    Vent Mode: PRVC FiO2 (%):  [60 %-100 %] 60 % Set Rate:  [25 bmp] 25 bmp Vt Set:  [520 mL] 520 mL PEEP:  [5 cmH20] 5 cmH20 Plateau Pressure:  [23 cmH20] 23 cmH20   Intake/Output Summary (Last 24 hours) at 05/05/2023 0607 Last data filed at 05/05/2023 0354 Gross per 24 hour  Intake 600 ml  Output --  Net 600 ml   Filed Weights   05/05/23 0300  Weight: 101.7 kg   General:  elderly F, critically ill on bipap, unresponsive HEENT: MM pink/moist, PERRLA Neuro: unresponsive to pain or voice on bipap CV: s1s2 rrr, no m/r/g PULM:  decreased air entry in all lung fields, no rhonchi or wheezing, initially seen on bipap GI: soft, non-distended Extremities: warm/dry, 2+ edema  Skin: no rashes or lesions   Resolved Hospital Problem list     Assessment & Plan:   Acute Hypoxic and Hypercarbic Respiratory Failure in the setting of COPD and Emphysema  Does not appear to be on home O2, on Breztri -Solumedrol 125mg  bid today followed by 40mg  po every day -Brovana, Yupelri and Pulmicort nebs -Ceftriaxone, respiratory and blood cultures pending --Maintain full vent support with SAT/SBT as tolerated -titrate Vent setting to maintain SpO2 greater than or equal to 90%. -HOB elevated 30 degrees. -Plateau pressures less than 30 cm H20.  -Follow chest x-ray, ABG prn.   -Bronchial hygiene and RT/bronchodilator protocol. -fentanyl gtt for PAD protocol -CTA PE protocol ordered in the ED and pending  Baseline CKD 3a -creatinine near baseline, no UOP in the ED, place foley -trend renal indices -continue Mybetriq  Elevated BNP -BNP 200, Trop 94, consider echo  Type 2 DM -SSI -hold Zepbound  Rheumatoid Arthritis -hold home Prednisone 10mg  every day -on Enbrel injections -holding home Norco  Bipolar Disorder -continue Depakote and zoloft, holding wellbutrin and buspar for now  HL -continue statin  HTN -hypertensive in the ED, continue home Metoprolol XL 12.5mg    Best  Practice (right click and "Reselect all SmartList Selections" daily)   Diet/type: NPO DVT prophylaxis LMWH Pressure ulcer(s): N/A GI prophylaxis: PPI Lines: N/A Foley:  Yes, and it is still needed Code Status:  full code Last date of multidisciplinary goals of care discussion [pending, tried to reach sister to update her with intubation but did not get an answer]  Labs   CBC: Recent Labs  Lab 05/04/23 2137 05/04/23 2146 05/04/23 2339 05/05/23 0427  WBC 7.2  --   --  6.9  NEUTROABS 2.6  --   --   --   HGB 12.7 13.9 13.3 12.6  HCT 42.3 41.0 39.0 42.4  MCV 104.4*  --   --  106.3*  PLT 190  --   --  172    Basic Metabolic Panel: Recent Labs  Lab 05/04/23 2137 05/04/23 2146 05/04/23 2339 05/05/23 0427  NA 143 139 139 139  K 4.2 4.0 4.1 4.4  CL 96*  --   --  99  CO2 32  --   --  31  GLUCOSE 125*  --   --  154*  BUN 24*  --   --  21  CREATININE 1.17*  --   --  1.02*  CALCIUM 9.1  --   --  8.3*   GFR: Estimated Creatinine Clearance: 61.1 mL/min (A) (by C-G formula based on SCr of 1.02 mg/dL (H)). Recent Labs  Lab 05/04/23 2137 05/05/23 0427  WBC 7.2 6.9  LATICACIDVEN 1.3  --     Liver Function Tests: Recent Labs  Lab 05/04/23 2137  AST 37  ALT 28  ALKPHOS 40  BILITOT 0.9  PROT 6.5  ALBUMIN 3.2*   No results for input(s): "LIPASE", "AMYLASE" in the last 168 hours. Recent Labs  Lab 05/04/23 2138  AMMONIA 35    ABG    Component Value Date/Time   HCO3 41.8 (H) 05/05/2023 0427   TCO2 38 (H) 05/04/2023 2339   O2SAT 73.8 05/05/2023 0427     Coagulation Profile: No results for input(s): "INR", "PROTIME" in the last 168 hours.  Cardiac Enzymes: No results for input(s): "CKTOTAL", "CKMB", "CKMBINDEX", "TROPONINI" in the last 168 hours.  HbA1C: Hgb A1c MFr Bld  Date/Time Value Ref Range Status  07/13/2022 12:23 PM 5.2 4.6 - 6.5 % Final    Comment:    Glycemic Control Guidelines for People with Diabetes:Non Diabetic:  <6%Goal of Therapy:  <7%Additional Action Suggested:  >8%   07/11/2021 09:38 AM 5.4 4.6 - 6.5 % Final    Comment:    Glycemic Control Guidelines for People with Diabetes:Non Diabetic:  <6%Goal of Therapy: <7%Additional Action Suggested:  >8%     CBG: No results for input(s): "GLUCAP" in the last 168 hours.  Review of Systems:   Unable to obtain  Past Medical History:  She,  has a past medical history of Allergy (Hayfever), Amaurosis fugax, Anemia, Anxiety, Arthritis, Asthma, Cataract, Chronic fatigue syndrome, Chronic kidney disease, COPD (chronic obstructive pulmonary disease) (HCC), COVID-19, Depression, Diverticulitis, Emphysema of lung (HCC), Fibromyalgia, GERD (gastroesophageal reflux disease), H/O hiatal hernia, History of bronchitis, Hyperlipidemia, Hypothyroidism, Interstitial cystitis, Irritable bowel syndrome, Lichen sclerosus, Lumbar herniated disc, Migraines, Motor nerve conduction block (11/2021), Osteoporosis (09/10/2022), Pneumonia, PONV (postoperative nausea and vomiting), Shortness of breath, Thyroid disease, Urinary frequency, Urinary tract infection, and  Vertigo.   Surgical History:   Past Surgical History:  Procedure Laterality Date   ABDOMINAL HYSTERECTOMY     bladder sugery      CAROTID DOPPLAR     COLONOSCOPY  05/26/2010   avms- otherwise nl , re check 10y   DEXA-OSTEOPENIA     DOPPLER ECHOCARDIOGRAPHY     elbow surgery     EPICONDYLITIS     EYE SURGERY Bilateral    cataracts   FOOT SURGERY Bilateral    I & D EXTREMITY Right 09/01/2020   Procedure: TYNOSYNOVECTOMY;  Surgeon: [name], MD;  Location: [facility] OR;  Service: Orthopedics;  Laterality: Right;   IMPLANTATION VAGAL NERVE STIMULATOR  03/2022   JOINT REPLACEMENT     KNEE SURGERY     Left cartilage   lipoma in second finger right hand     MOUTH SURGERY  08/22/2022   PLANTAR FASCIA SURGERY Left    REVERSE SHOULDER ARTHROPLASTY Left 02/02/2020   Procedure: LEFT REVERSE SHOULDER ARTHROPLASTY;  Surgeon: [name], MD;  Location: [facility] OR;  Service: Orthopedics;  Laterality: Left;   ROTATOR CUFF REPAIR Left 12/2017   TEMPOROMANDIBULAR JOINT SURGERY     TONSILLECTOMY     TOTAL HIP ARTHROPLASTY Right 03/20/2013   Procedure: Right TOTAL HIP ARTHROPLASTY;  Surgeon: [name], MD;  Location: [facility] OR;  Service: Orthopedics;  Laterality: Right;  Right Total Hip Arthroplasty   TOTAL KNEE ARTHROPLASTY Left 01/28/2015   Procedure: LEFT TOTAL KNEE ARTHROPLASTY;  Surgeon: [name], MD;  Location: [facility] ORS;  Service: Orthopedics;  Laterality: Left;   TRIGGER FINGER RELEASE Right    TROCHANTERIC BURSA EXCISION Right 05/03/2016   Dr. Doneen Poisson   WRIST ARTHROSCOPY Right    ligament tear     Social History:   reports that she quit smoking about 10 years ago. Her smoking use included cigarettes. She started smoking about 53 years ago. She has a 86 pack-year smoking history. She has never been exposed to tobacco smoke. She has never used smokeless tobacco. She reports that she does not drink alcohol and does not use drugs.   Family History:  Her family history includes Arthritis in her maternal grandmother, mother, and sister; Cancer in her father; Cancer - Lung in her cousin; Colon cancer in an other family member; Hearing loss in her maternal grandfather; Heart disease in her mother and sister; Hypertension in her mother; Kidney disease in her mother; Stroke in her mother; Varicose Veins in her maternal uncle.   Allergies Allergies  Allergen Reactions   Lithium Anaphylaxis   Tegretol [Carbamazepine] Other (See Comments)    Fever and body aches (fever over 103)   Tricyclic Antidepressants Anaphylaxis    Other reaction(s): Unknown   Methotrexate Derivatives Other (See Comments)    Urinary retention and dizziness  Other reaction(s): Other UTI   Strawberry Extract Hives   Tapentadol Rash    PT ALLERGIC NYLON TAPE    Codeine Nausea Only    Makes pt stay awake   Cymbalta  [Duloxetine Hcl] Other (See Comments)    Makes pt pass out    Erythromycin     abdominal pain   Lyrica [Pregabalin]     Felt faint   Neurontin [Gabapentin]     Passes  out   Rabeprazole Sodium     insomnia   Baclofen Nausea Only    Sever nausea   Duraprep [Antiseptic Products, Misc.] Itching and Rash    Tolerates Betadine  Penicillins Rash    Tolerated ANCEF 02/02/20  Has patient had a PCN reaction causing immediate rash, facial/tongue/throat swelling, SOB or lightheadedness with hypotension: Yes Has patient had a PCN reaction causing severe rash involving mucus membranes or skin necrosis: No Has patient had a PCN reaction that required hospitalization No Has patient had a PCN reaction occurring within the last 10 years: No If all of the above answers are "NO", then may proceed with Cephalosporin use.    Tape Rash    PT ALLERGIC NYLON TAPE      Home Medications  Prior to Admission medications   Medication Sig Start Date End Date Taking? Authorizing Provider  acetaminophen (TYLENOL) 500 MG tablet Take 500 mg by mouth every 6 (six) hours as needed.   Yes [provider]  alendronate (FOSAMAX) 70 MG tablet TAKE 1 TABLET BY MOUTH ONCE A WEEK. TAKE WITH A FULL GLASS OF WATER ON AN EMPTY STOMACH. 02/11/23  Yes Rice, Jamesetta Orleans, MD  Black Cohosh 40 MG CAPS Take 1 capsule (40 mg total) by mouth daily. 05/25/21  Yes Constant, Peggy, MD  Budeson-Glycopyrrol-Formoterol (BREZTRI AEROSPHERE) 160-9-4.8 MCG/ACT AERO Inhale 2 puffs into the lungs 2 (two) times daily. 02/10/23  Yes Tower, Audrie Gallus, MD  buPROPion (WELLBUTRIN XL) 150 MG 24 hr tablet Take 150 mg by mouth daily.   Yes [provider]  busPIRone (BUSPAR) 15 MG tablet Take 15 mg by mouth 2 (two) times daily before a meal.   Yes [provider]  cholecalciferol (VITAMIN D) 25 MCG (1000 UNIT) tablet Take 2,000 Units by mouth daily. 2 tablets daily   Yes [provider]  clobetasol cream (TEMOVATE)  0.05 % APPLY SMALL AMOUNT TO AFFECTED AREAS TWO TO THREE TIMES PER WEEK 02/08/23  Yes Tower, Audrie Gallus, MD  cyanocobalamin 2000 MCG tablet Take 500 mcg by mouth daily.   Yes [provider]  diclofenac Sodium (VOLTAREN) 1 % GEL Apply 2 g topically 2 (two) times daily as needed (pain). 09/07/20  Yes Lynn Ito, MD  divalproex (DEPAKOTE ER) 500 MG 24 hr tablet Take 500 mg by mouth at bedtime.  11/02/19  Yes [provider]  etanercept (ENBREL SURECLICK) 50 MG/ML injection Inject 50 mg into the skin once a week. 04/22/23  Yes Rice, Jamesetta Orleans, MD  fluticasone (FLONASE) 50 MCG/ACT nasal spray PLACE 2 SPRAYS INTO THE NOSE DAILY. 12/19/21  Yes Young, Joni Fears D, MD  HYDROcodone-acetaminophen (NORCO/VICODIN) 5-325 MG tablet Take 1 tablet by mouth 3 (three) times daily as needed for moderate pain. 11/20/22  Yes [name], MD  hydrocortisone cream 1 % Apply 1 Application topically 2 (two) times daily as needed for itching.   Yes [provider]  ibuprofen (ADVIL) 200 MG tablet Take 200 mg by mouth every 6 (six) hours as needed.   Yes [provider]  ipratropium (ATROVENT) 0.06 % nasal spray Place 2 sprays into both nostrils 4 (four) times daily. Patient states she uses 2 times per day.   Yes [provider]  ketotifen (ZADITOR) 0.025 % ophthalmic solution Place 1 drop into both eyes 2 (two) times daily as needed (allergies).   Yes [provider]  levothyroxine (SYNTHROID) 50 MCG tablet TAKE 1 TABLET BY MOUTH DAILY BEFORE BREAKFAST 04/16/23  Yes Tower, Marne A, MD  LORazepam (ATIVAN) 1 MG tablet Take 1 mg by mouth at bedtime as needed for anxiety or sleep. 06/28/20  Yes [provider]  metoprolol succinate (TOPROL-XL) 25 MG 24  hr tablet Take 0.5 tablets (12.5 mg total) by mouth daily. 02/12/23  Yes Tower, Audrie Gallus, MD  mirabegron ER (MYRBETRIQ) 25 MG TB24 tablet Take 1 tablet (25 mg total) by mouth in the morning and at bedtime. 04/29/22  Yes  Tower, Audrie Gallus, MD  mirtazapine (REMERON) 30 MG tablet Take 30 mg by mouth at bedtime.  07/22/15  Yes [provider]  ondansetron (ZOFRAN-ODT) 4 MG disintegrating tablet Take 1 tablet (4 mg total) by mouth every 8 (eight) hours as needed for nausea or vomiting. 02/10/21  Yes Tower, Audrie Gallus, MD  predniSONE (DELTASONE) 10 MG tablet TAKE 1 TABLET (10 MG TOTAL) BY MOUTH DAILY WITH BREAKFAST. 04/23/23  Yes Rice, Jamesetta Orleans, MD  Rhubarb (ESTROVEN COMPLETE) 4 MG TABS Take 4 mg by mouth daily. 05/25/21  Yes Constant, Peggy, MD  sertraline (ZOLOFT) 100 MG tablet Take 100 mg by mouth daily.  12/06/14  Yes [provider]  simvastatin (ZOCOR) 20 MG tablet TAKE 1 TABLET BY MOUTH AT BEDTIME 04/29/23  Yes Tower, Audrie Gallus, MD  Spacer/Aero-Holding Chambers (AEROCHAMBER PLUS) inhaler Use with inhaler 06/29/21  Yes Domenick Gong, MD  traMADol (ULTRAM) 50 MG tablet Take 50 mg by mouth every 6 (six) hours as needed for moderate pain (pain score 4-6).   Yes [provider]  diphenhydrAMINE (BENADRYL) 25 MG tablet Take 25 mg by mouth every 6 (six) hours as needed for allergies. Patient not taking: Reported on 05/05/2023    [provider]  ipratropium-albuterol (DUONEB) 0.5-2.5 (3) MG/3ML SOLN Take 3 mLs by nebulization every 6 (six) hours as needed. Patient not taking: Reported on 05/05/2023 12/29/19   Jetty Duhamel D, MD  tirzepatide [facility]) 2.5 MG/0.5ML injection vial Inject 2.5 mg into the skin once a week. Patient not taking: Reported on 05/05/2023 03/24/23   [name], MD     Critical care time: 60 minutes      CRITICAL CARE Performed by: Darcella Gasman Gleason   Total critical care time: 60 minutes  Critical care time was exclusive of separately billable procedures and treating other patients.  Critical care was necessary to treat or prevent imminent or life-threatening deterioration.  Critical care was time spent personally by me on the following activities:  development of treatment plan with patient and/or surrogate as well as nursing, discussions with consultants, evaluation of patient's response to treatment, examination of patient, obtaining history from patient or surrogate, ordering and performing treatments and interventions, ordering and review of laboratory studies, ordering and review of radiographic studies, pulse oximetry and re-evaluation of patient's condition.   Darcella Gasman Gleason, PA-C [facility] See Amion for pager If no response to pager , please call [phone] until 7pm After 7:00 pm call Elink  [phone]  Attending note: I have seen and examined the patient. History, labs and imaging reviewed.  Blood pressure (!) 143/72, pulse 89, temperature 98.8 F (37.1 C), resp. rate 20, height 5\' 7"  (1.702 m), weight 224 lb 3.3 oz (101.7 kg), SpO2 95%. Gen:     Obtunded on BiPAP, even with sternal rub HEENT:  Pupils round, sclera anicteric Neck:     No masses; no thyromegaly Lungs:    Clear to auscultation bilaterally; normal respiratory effort CV:         Regular rate and rhythm; no murmurs Abd:      + bowel sounds; soft, non-tender; no palpable masses, no distension Ext:    No edema; adequate peripheral perfusion Skin:  Warm and dry; no rash Neuro:  Obtunded, not waking up Labs/Imaging personally reviewed, significant for   Assessment/plan:  73 y/o female with 3 days of flu like symptoms, placed on BiPAP in ED, PCO>90 and she was obtunded and required intubation.  The patient is critically ill with multiple organ systems failure and requires high complexity decision making for assessment and support, frequent evaluation and titration of therapies, application of advanced monitoring technologies and extensive interpretation of multiple databases.  Critical care time - 35 mins. This represents my time independent of the NPs time taking care of the pt.  Rozann Lesches MD [facility] 05/05/2023, 6:59 AM

## 2023-05-05 NOTE — Progress Notes (Addendum)
 eLink Physician-Brief Progress Note Patient Name: Lori Orozco DOB: 10/10/1950 MRN: [id number]   Date of Service  05/05/2023  HPI/Events of Note  73 year old female with a history of COPD in the setting of previous tobacco use, CKD, fibromyalgia and bipolar disorder who is seen with respiratory failure with hypoxia and hypercapnia in the setting of bilateral airspace disease now intubated.  Angiography reveals segmental and subsegmental bilateral lower lobe pulmonary emboli and she is on anticoagulation.  No evidence of right heart strain.  No previous echocardiogram.  Immunocompromised on Enbrel.  Hypertensive with a systolic of 198.  Heart rate 65.  Currently receiving metoprolol tartrate scheduled.  eICU Interventions  Add as needed hydralazine goal systolic less than 165 hydralazine.  Add labetalol second line.   2117 -updated the family at bedside with regards to CT results.  No immediate changes in care.  Reviewed medications and reaffirmed care plan with the family.  Intervention Category Intermediate Interventions: Respiratory distress - evaluation and management;Hypertension - evaluation and management  Lori Orozco 05/05/2023, 7:40 PM

## 2023-05-05 NOTE — ED Notes (Signed)
 RT at bedside to adjust ET tube per order. OG adjusted per Xray recommendations.

## 2023-05-05 NOTE — Procedures (Addendum)
 Intubation Procedure Note  Lori Orozco  [id number]  1950-07-08  Date:05/05/23  Time:6:06 AM   Provider Performing:Laura R Gleason    Procedure: Intubation (31500)  Indication(s) Respiratory Failure  Consent Unable to obtain consent due to emergent nature of procedure.   Anesthesia Etomidate and Rocuronium   Time Out Verified patient identification, verified procedure, site/side was marked, verified correct patient position, special equipment/implants available, medications/allergies/relevant history reviewed, required imaging and test results available.   Sterile Technique Usual hand hygeine, masks, and gloves were used   Procedure Description Patient positioned in bed supine.  Sedation given as noted above.  Patient was intubated with endotracheal tube using Glidescope.  View was Grade 1 full glottis .  Number of attempts was 1.  Colorimetric CO2 detector was consistent with tracheal placement.   Complications/Tolerance None; patient tolerated the procedure well. Chest X-ray is ordered to verify placement.   EBL Minimal   Specimen(s) None   Vernona Rieger R Gleason, PA-C  I was present during this procedure. Rozann Lesches, MD

## 2023-05-06 ENCOUNTER — Telehealth (HOSPITAL_COMMUNITY): Payer: Self-pay | Admitting: Pharmacy Technician

## 2023-05-06 ENCOUNTER — Inpatient Hospital Stay (HOSPITAL_COMMUNITY)

## 2023-05-06 ENCOUNTER — Other Ambulatory Visit (HOSPITAL_COMMUNITY): Payer: Self-pay

## 2023-05-06 DIAGNOSIS — I428 Other cardiomyopathies: Secondary | ICD-10-CM | POA: Diagnosis not present

## 2023-05-06 DIAGNOSIS — J189 Pneumonia, unspecified organism: Secondary | ICD-10-CM | POA: Diagnosis not present

## 2023-05-06 DIAGNOSIS — Z86711 Personal history of pulmonary embolism: Secondary | ICD-10-CM

## 2023-05-06 DIAGNOSIS — J441 Chronic obstructive pulmonary disease with (acute) exacerbation: Secondary | ICD-10-CM | POA: Diagnosis not present

## 2023-05-06 DIAGNOSIS — J9601 Acute respiratory failure with hypoxia: Secondary | ICD-10-CM | POA: Diagnosis not present

## 2023-05-06 LAB — BASIC METABOLIC PANEL
Anion gap: 13 (ref 5–15)
BUN: 26 mg/dL — ABNORMAL HIGH (ref 8–23)
CO2: 23 mmol/L (ref 22–32)
Calcium: 8.7 mg/dL — ABNORMAL LOW (ref 8.9–10.3)
Chloride: 102 mmol/L (ref 98–111)
Creatinine, Ser: 1.22 mg/dL — ABNORMAL HIGH (ref 0.44–1.00)
GFR, Estimated: 47 mL/min — ABNORMAL LOW (ref >60)
Glucose, Bld: 148 mg/dL — ABNORMAL HIGH (ref 70–99)
Potassium: 3.5 mmol/L (ref 3.5–5.1)
Sodium: 138 mmol/L (ref 135–145)

## 2023-05-06 LAB — CBC
HCT: 42.4 % (ref 36.0–46.0)
Hemoglobin: 13.2 g/dL (ref 12.0–15.0)
MCH: 31.1 pg (ref 26.0–34.0)
MCHC: 31.1 g/dL (ref 30.0–36.0)
MCV: 99.8 fL (ref 80.0–100.0)
Platelets: 162 10*3/uL (ref 150–400)
RBC: 4.25 MIL/uL (ref 3.87–5.11)
RDW: 16.6 % — ABNORMAL HIGH (ref 11.5–15.5)
WBC: 6.8 10*3/uL (ref 4.0–10.5)
nRBC: 0.4 % — ABNORMAL HIGH (ref 0.0–0.2)

## 2023-05-06 LAB — ECHOCARDIOGRAM COMPLETE
AR max vel: 1.73 cm2
AV Peak grad: 7.1 mmHg
Ao pk vel: 1.33 m/s
Area-P 1/2: 3.12 cm2
Height: 67 in
S' Lateral: 3.1 cm
Weight: 3470.92 [oz_av]

## 2023-05-06 LAB — HEPARIN LEVEL (UNFRACTIONATED)
Heparin Unfractionated: >1.1 [IU]/mL — ABNORMAL HIGH (ref 0.30–0.70)
Heparin Unfractionated: 1.1 [IU]/mL — ABNORMAL HIGH (ref 0.30–0.70)

## 2023-05-06 LAB — GLUCOSE, CAPILLARY
Glucose-Capillary: 134 mg/dL — ABNORMAL HIGH (ref 70–99)
Glucose-Capillary: 137 mg/dL — ABNORMAL HIGH (ref 70–99)
Glucose-Capillary: 138 mg/dL — ABNORMAL HIGH (ref 70–99)
Glucose-Capillary: 138 mg/dL — ABNORMAL HIGH (ref 70–99)
Glucose-Capillary: 155 mg/dL — ABNORMAL HIGH (ref 70–99)
Glucose-Capillary: 155 mg/dL — ABNORMAL HIGH (ref 70–99)

## 2023-05-06 LAB — PHOSPHORUS
Phosphorus: 3.4 mg/dL (ref 2.5–4.6)
Phosphorus: 2.7 mg/dL (ref 2.5–4.6)

## 2023-05-06 LAB — MAGNESIUM
Magnesium: 1.7 mg/dL (ref 1.7–2.4)
Magnesium: 1.8 mg/dL (ref 1.7–2.4)

## 2023-05-06 LAB — APTT: aPTT: 121 s — ABNORMAL HIGH (ref 24–36)

## 2023-05-06 MED ORDER — PROSOURCE TF20 ENFIT COMPATIBL EN LIQD
60.0000 mL | Freq: Every day | ENTERAL | Status: DC
  Administered 2023-05-06 – 2023-05-07 (×2): 60 mL
  Filled 2023-05-06 (×2): qty 60

## 2023-05-06 MED ORDER — HYDRALAZINE HCL 25 MG PO TABS
25.0000 mg | ORAL_TABLET | Freq: Three times a day (TID) | ORAL | Status: DC
  Administered 2023-05-06 – 2023-05-07 (×2): 25 mg
  Filled 2023-05-06 (×2): qty 1

## 2023-05-06 MED ORDER — KETOTIFEN FUMARATE 0.035 % OP SOLN: 1.0000 [drp] | Freq: Two times a day (BID) | OPHTHALMIC | Status: DC | PRN

## 2023-05-06 MED ORDER — AZITHROMYCIN 500 MG PO TABS
500.0000 mg | ORAL_TABLET | Freq: Every day | ORAL | Status: DC
  Administered 2023-05-06 – 2023-05-07 (×2): 500 mg
  Filled 2023-05-06 (×2): qty 1

## 2023-05-06 MED ORDER — HYDRALAZINE HCL 25 MG PO TABS: 25.0000 mg | ORAL_TABLET | Freq: Three times a day (TID) | ORAL | Status: DC

## 2023-05-06 MED ORDER — MAGNESIUM SULFATE 4 GM/100ML IV SOLN
4.0000 g | Freq: Once | INTRAVENOUS | Status: AC
  Administered 2023-05-06: 4 g via INTRAVENOUS
  Filled 2023-05-06: qty 100

## 2023-05-06 MED ORDER — VITAL HIGH PROTEIN PO LIQD
1000.0000 mL | ORAL | Status: DC
  Administered 2023-05-06: 1000 mL

## 2023-05-06 MED ORDER — MIRTAZAPINE 15 MG PO TABS
30.0000 mg | ORAL_TABLET | Freq: Every day | ORAL | Status: DC
  Administered 2023-05-06: 30 mg
  Filled 2023-05-06: qty 2

## 2023-05-06 MED ORDER — HEPARIN (PORCINE) 25000 UT/250ML-% IV SOLN
950.0000 [IU]/h | INTRAVENOUS | Status: DC
  Administered 2023-05-06: 1050 [IU]/h via INTRAVENOUS
  Administered 2023-05-07: 950 [IU]/h via INTRAVENOUS
  Filled 2023-05-06: qty 250

## 2023-05-06 MED ORDER — POTASSIUM CHLORIDE 20 MEQ PO PACK
40.0000 meq | PACK | Freq: Once | ORAL | Status: AC
  Administered 2023-05-06: 40 meq
  Filled 2023-05-06: qty 2

## 2023-05-06 MED ORDER — BUPROPION HCL 75 MG PO TABS
75.0000 mg | ORAL_TABLET | Freq: Two times a day (BID) | ORAL | Status: DC
  Administered 2023-05-06 – 2023-05-07 (×3): 75 mg
  Filled 2023-05-06 (×5): qty 1

## 2023-05-06 MED ORDER — OSMOLITE 1.5 CAL PO LIQD
1000.0000 mL | ORAL | Status: DC
  Administered 2023-05-06 – 2023-05-07 (×2): 1000 mL
  Filled 2023-05-06 (×4): qty 1000

## 2023-05-06 MED ORDER — BUSPIRONE HCL 15 MG PO TABS
15.0000 mg | ORAL_TABLET | Freq: Two times a day (BID) | ORAL | Status: DC
  Administered 2023-05-06 – 2023-05-07 (×3): 15 mg
  Filled 2023-05-06 (×3): qty 1

## 2023-05-06 MED ORDER — METHYLENE BLUE (ANTIDOTE) 1 % IV SOLN
2.0000 mg/kg | Freq: Once | Status: DC
  Filled 2023-05-06: qty 19.7

## 2023-05-06 NOTE — TOC CM/SW Note (Signed)
 Transition of Care [facility]) - Inpatient Brief Assessment   Patient Details  Name: Lori Orozco MRN: [id number] Date of Birth: [birth year]  [facility] CM/SW Contact:    Harriet Masson, RN Phone Number: 05/06/2023, 1:17 PM   Clinical Narrative:  NCM unable to assess patient due to intubation at this time. Patient not medically stable for discharge.  NCM will continue to follow as patient progresses with care towards discharge.   Transition of Care Asessment: Insurance and Status: Insurance coverage has been reviewed Patient has primary care physician: Yes     Prior/Current Home Services: No current home services [facility] in the past) Social Drivers of Health Review: SDOH reviewed needs interventions (food insecurity) Readmission risk has been reviewed: Yes Transition of care needs: transition of care needs identified, TOC will continue to follow

## 2023-05-06 NOTE — Telephone Encounter (Signed)
 Patient Product/process development scientist completed.    The patient is insured through [employer]. Patient has Medicare and is not eligible for a copay card, but may be able to apply for patient assistance or Medicare RX Payment Plan (Patient Must reach out to their plan, if eligible for payment plan), if available.    Ran test claim for Eliquis 5 mg and the current 30 day co-pay is $12.15.   This test claim was processed through [facility]- copay amounts may vary at other pharmacies due to pharmacy/plan contracts, or as the patient moves through the different stages of their insurance plan.     Roland Earl, CPHT Pharmacy Technician III Certified Patient Advocate [facility] Patient Advocate Team Direct Number: [phone]  Fax: [phone]

## 2023-05-06 NOTE — Progress Notes (Signed)
 [facility] ADULT ICU REPLACEMENT PROTOCOL   The patient does apply for the [facility] Adult ICU Electrolyte Replacment Protocol based on the criteria listed below:   1.Exclusion criteria: TCTS, ECMO, Dialysis, and Myasthenia Gravis patients 2. Is GFR >/= 30 ml/min? Yes.    Patient's GFR today is 47 3. Is SCr </= 2? Yes.   Patient's SCr is 1.22 mg/dL 4. Did SCr increase >/= 0.5 in 24 hours? No. 5.Pt's weight >40kg  Yes.   6. Abnormal electrolyte(s): K+ 3.5  7. Electrolytes replaced per protocol 8.  Call MD STAT for K+ </= 2.5, Phos </= 1, or Mag </= 1 Physician:  Dr. Loralyn Freshwater, Lilia Argue 05/06/2023 4:31 AM

## 2023-05-06 NOTE — Progress Notes (Signed)
 Initial Nutrition Assessment  DOCUMENTATION CODES:  Not applicable  INTERVENTION:  Adjust tube feeding via OGT: Osmolite 1.5 at 45 ml/h (1080 ml per day) Start at 35 and advance by 10mL q12h Prosource TF20 60 ml 1x/d Provides 1700 kcal, 88 gm protein, 823 ml free water daily  NUTRITION DIAGNOSIS:  Inadequate oral intake related to inability to eat as evidenced by NPO status.  GOAL:  Patient will meet greater than or equal to 90% of their needs  MONITOR:  TF tolerance, I & O's, Vent status, Labs  REASON FOR ASSESSMENT:  Ventilator, Consult Enteral/tube feeding initiation and management  ASSESSMENT:  Pt with hx of COPD (emphysema), HLD, HTN, IBS, grave's disease, GERD, and CKD3a presented to ED with AMS and respiratory failure. Found to have COPD exacerbation  3/9 - intubated  Patient is currently intubated on ventilator support. No family present in room at this time to provide a nutrition hx. On exam, pt with some mild muscle depletions and generalized edema. Consult received to start enteral feeds. Protocol ordered and Vital High Protein currently infusing at 34mL/h.  Discussed in rounds. Hopeful to attempt extubation tomorrow but will adjust TF to better meet needs.    MV: 12.9 L/min Temp (24hrs), Avg:97.8 F (36.6 C), Min:97.5 F (36.4 C), Max:98.4 F (36.9 C) MAP (cuff):  Admit weight: 101.6 kg ? Accuracy, same weight as from 01/2023 encounter  Current weight: 98.4 kg   Intake/Output Summary (Last 24 hours) at 05/06/2023 1543 Last data filed at 05/06/2023 1300 Gross per 24 hour  Intake 1657.3 ml  Output 606 ml  Net 1051.3 ml  Net IO Since Admission: 1,850.31 mL [05/06/23 1543]  Drains/Lines: OGT 14 Fr. UOP x 24 hours  Nutritionally Relevant Medications: Scheduled Meds:  docusate  100 mg Per Tube BID   famotidine  20 mg Per Tube BID   insulin aspart  0-9 Units Subcutaneous Q4H   polyethylene glycol  17 g Per Tube Daily   predniSONE  40 mg  Per Tube Q breakfast   simvastatin  20 mg Per Tube QHS   Continuous Infusions:  cefTRIAXone (ROCEPHIN)  IV Stopped (05/06/23 0510)   dexmedetomidine (PRECEDEX) IV infusion 1.2 mcg/kg/hr (05/06/23 0900)   PRN Meds: ondansetron, polyethylene glycol  Labs Reviewed: BUN 26, creatinine 1.22 CBG ranges from 134-204 mg/dL over the last 24 hours HgbA1c 5.6%  NUTRITION - FOCUSED PHYSICAL EXAM: Flowsheet Row Most Recent Value  Orbital Region Mild depletion  Upper Arm Region No depletion  Thoracic and Lumbar Region No depletion  Buccal Region Unable to assess  [ETT holder]  Temple Region No depletion  Clavicle Bone Region No depletion  Clavicle and Acromion Bone Region Mild depletion  Scapular Bone Region Mild depletion  Dorsal Hand Unable to assess  [mittens]  Patellar Region No depletion  Anterior Thigh Region No depletion  Posterior Calf Region No depletion  Edema (RD Assessment) Mild  [generalized]  Hair Reviewed  Eyes Reviewed  Mouth Reviewed  Skin Reviewed  [scattered bruising, thin skin]  Nails Reviewed   Diet Order:   Diet Order     None       EDUCATION NEEDS:  Not appropriate for education at this time  Skin:  Skin Assessment: Reviewed RN Assessment Laceration - left pretibial (4 x 1 cm) - right pretibial (3 x 1 cm)  Last BM:  unsure  Height:  Ht Readings from Last 1 Encounters:  05/05/23 5\' 7"  (1.702 m)    Weight:  Wt Readings  from Last 1 Encounters:  05/06/23 98.4 kg    Ideal Body Weight:  61.4 kg  BMI:  Body mass index is 33.98 kg/m.  Estimated Nutritional Needs:  Kcal:  1700-1900 kcal/d Protein:  85-100 g/d Fluid:  1.8L/d    Greig Castilla, RD, LDN Registered Dietitian II Please reach out via secure chat Weekend on-call pager # available in [facility]

## 2023-05-06 NOTE — Progress Notes (Signed)
  Echocardiogram 2D Echocardiogram has been performed.  Lucendia Herrlich 05/06/2023, 12:24 PM

## 2023-05-06 NOTE — Progress Notes (Signed)
 Bilateral lower extremity venous duplex has been completed. Preliminary results can be found in CV Proc through chart review.   05/06/23 2:46 PM Olen Cordial RVT

## 2023-05-06 NOTE — Progress Notes (Signed)
 eLink Physician-Brief Progress Note Patient Name: SYRIANA CROSLIN DOB: 09-06-1950 MRN: [id number]   Date of Service  05/06/2023  HPI/Events of Note  Mg 1.7  eICU Interventions  Magnesium sulfate ordered     Intervention Category Minor Interventions: Electrolytes abnormality - evaluation and management  Kasidi Shanker 05/06/2023, 9:06 PM

## 2023-05-06 NOTE — Progress Notes (Signed)
 RT assisted with patient transport on mechanical ventilator to CT and return to 2M13. No adverse events occurred during transport, patient tolerated transport well.

## 2023-05-06 NOTE — Progress Notes (Signed)
 PHARMACY - ANTICOAGULATION CONSULT NOTE  Pharmacy Consult for heparin Indication: pulmonary embolus  Allergies  Allergen Reactions   Lithium Anaphylaxis   Tegretol [Carbamazepine] Other (See Comments)    Fever and body aches (fever over 103)   Tricyclic Antidepressants Anaphylaxis    Other reaction(s): Unknown   Methotrexate Derivatives Other (See Comments)    Urinary retention and dizziness  Other reaction(s): Other UTI   Strawberry Extract Hives   Tapentadol Rash    PT ALLERGIC NYLON TAPE    Codeine Nausea Only    Makes pt stay awake   Cymbalta [Duloxetine Hcl] Other (See Comments)    Makes pt pass out    Erythromycin     abdominal pain   Lyrica [Pregabalin]     Felt faint   Neurontin [Gabapentin]     Passes  out   Rabeprazole Sodium     insomnia   Baclofen Nausea Only    Sever nausea   Duraprep [Antiseptic Products, Misc.] Itching and Rash    Tolerates Betadine    Penicillins Rash    Tolerated ANCEF 02/02/20  Has patient had a PCN reaction causing immediate rash, facial/tongue/throat swelling, SOB or lightheadedness with hypotension: Yes Has patient had a PCN reaction causing severe rash involving mucus membranes or skin necrosis: No Has patient had a PCN reaction that required hospitalization No Has patient had a PCN reaction occurring within the last 10 years: No If all of the above answers are "NO", then may proceed with Cephalosporin use.    Tape Rash    PT ALLERGIC NYLON TAPE     Patient Measurements: Height: 5\' 7"  (170.2 cm) Weight: 98.4 kg (216 lb 14.9 oz) IBW/kg (Calculated) : 61.6 Heparin Dosing Weight: 84.4 kg  Vital Signs: Temp: 97.7 F (36.5 C) (03/10 1549) Temp Source: Oral (03/10 1549) BP: 123/66 (03/10 1519) Pulse Rate: 60 (03/10 1519)  Labs: Recent Labs    05/04/23 2137 05/04/23 2146 05/04/23 2325 05/04/23 2339 05/05/23 0427 05/05/23 0623 05/06/23 0020 05/06/23 0427 05/06/23 1416  HGB 12.7   < >  --    < > 12.6 13.9 13.2  --    --   HCT 42.3   < >  --    < > 42.4 41.0 42.4  --   --   PLT 190  --   --   --  172  --  162  --   --   APTT  --   --   --   --   --   --   --  121*  --   HEPARINUNFRC  --   --   --   --   --   --   --  >1.10* 1.10*  CREATININE 1.17*  --   --   --  1.02*  --  1.22*  --   --   TROPONINIHS 54*  --  75*  --  94*  --   --   --   --    < > = values in this interval not displayed.    Estimated Creatinine Clearance: 50.2 mL/min (A) (by C-G formula based on SCr of 1.22 mg/dL (H)).   Assessment: 40 yoF with bilateral pulmonary emboli without evidence of heart strain. No oral anticoagulation reported prior to admission, however the patient did receive one dose of Lovenox 40mg  around 1100 this morning. CBC WNL. Pharmacy consulted to manage heparin infusion.    Heparin level remains supratherapeutic (1.1) on infusion at 1250  units/hr. Level drawn appropriately and no bleeding noted.  Goal of Therapy:  Heparin level 0.3-0.7 units/ml Monitor platelets by anticoagulation protocol: Yes   Plan:  Hold heparin x 1 hour Restart hepatin at 1050 units/hr F/u 8 hr heparin level  Christoper Fabian, PharmD, BCPS Please see amion for complete clinical pharmacist phone list 05/06/2023 4:19 PM

## 2023-05-06 NOTE — Progress Notes (Signed)
 PHARMACY - ANTICOAGULATION CONSULT NOTE  Pharmacy Consult for heparin Indication: pulmonary embolus  Allergies  Allergen Reactions   Lithium Anaphylaxis   Tegretol [Carbamazepine] Other (See Comments)    Fever and body aches (fever over 103)   Tricyclic Antidepressants Anaphylaxis    Other reaction(s): Unknown   Methotrexate Derivatives Other (See Comments)    Urinary retention and dizziness  Other reaction(s): Other UTI   Strawberry Extract Hives   Tapentadol Rash    PT ALLERGIC NYLON TAPE    Codeine Nausea Only    Makes pt stay awake   Cymbalta [Duloxetine Hcl] Other (See Comments)    Makes pt pass out    Erythromycin     abdominal pain   Lyrica [Pregabalin]     Felt faint   Neurontin [Gabapentin]     Passes  out   Rabeprazole Sodium     insomnia   Baclofen Nausea Only    Sever nausea   Duraprep [Antiseptic Products, Misc.] Itching and Rash    Tolerates Betadine    Penicillins Rash    Tolerated ANCEF 02/02/20  Has patient had a PCN reaction causing immediate rash, facial/tongue/throat swelling, SOB or lightheadedness with hypotension: Yes Has patient had a PCN reaction causing severe rash involving mucus membranes or skin necrosis: No Has patient had a PCN reaction that required hospitalization No Has patient had a PCN reaction occurring within the last 10 years: No If all of the above answers are "NO", then may proceed with Cephalosporin use.    Tape Rash    PT ALLERGIC NYLON TAPE     Patient Measurements: Height: 5\' 7"  (170.2 cm) Weight: 98.4 kg (216 lb 14.9 oz) IBW/kg (Calculated) : 61.6 Heparin Dosing Weight: 84.4 kg  Vital Signs: Temp: 97.5 F (36.4 C) (03/10 0343) Temp Source: Axillary (03/10 0343) BP: 177/85 (03/10 0526) Pulse Rate: 67 (03/10 0230)  Labs: Recent Labs    05/04/23 2137 05/04/23 2146 05/04/23 2325 05/04/23 2339 05/05/23 0427 05/05/23 0623 05/06/23 0020 05/06/23 0427  HGB 12.7   < >  --    < > 12.6 13.9 13.2  --   HCT  42.3   < >  --    < > 42.4 41.0 42.4  --   PLT 190  --   --   --  172  --  162  --   APTT  --   --   --   --   --   --   --  121*  HEPARINUNFRC  --   --   --   --   --   --   --  >1.10*  CREATININE 1.17*  --   --   --  1.02*  --  1.22*  --   TROPONINIHS 54*  --  75*  --  94*  --   --   --    < > = values in this interval not displayed.    Estimated Creatinine Clearance: 50.2 mL/min (A) (by C-G formula based on SCr of 1.22 mg/dL (H)).   Medical History: Past Medical History:  Diagnosis Date   Allergy Hayfever   1958   Amaurosis fugax    Anemia    hx   Anxiety    Arthritis    Asthma    Cataract    Chronic fatigue syndrome    Chronic kidney disease    frequency, nephritis when 73 yrs old   COPD (chronic obstructive pulmonary disease) (HCC)  COVID-19    Depression    Diverticulitis    Emphysema of lung (HCC)    Fibromyalgia    GERD (gastroesophageal reflux disease)    occ   H/O hiatal hernia    History of bronchitis    Hyperlipidemia    Hypothyroidism    Interstitial cystitis    Irritable bowel syndrome    Lichen sclerosus    Lumbar herniated disc    Migraines    Motor nerve conduction block 11/2021   Osteoporosis 09/10/2022   Pneumonia    PONV (postoperative nausea and vomiting)    Shortness of breath    exertion    Thyroid disease    Graves   Urinary frequency    Urinary tract infection    Vertigo      Assessment: Lori Orozco with bilateral pulmonary emboli without evidence of heart strain. No oral anticoagulation reported prior to admission, however the patient did receive one dose of Lovenox 40mg  around 1100 this morning. CBC WNL. Pharmacy consulted to manage heparin infusion.     3/10 AM update:  Heparin level supra-therapeutic  Goal of Therapy:  Heparin level 0.3-0.7 units/ml Monitor platelets by anticoagulation protocol: Yes   Plan:  Dec heparin to 1250 units/hr Check anti-Xa level in 8 hours and daily while on heparin Continue to monitor H&H and  platelets  Abran Duke, PharmD, BCPS Clinical Pharmacist Phone: [phone]

## 2023-05-06 NOTE — Progress Notes (Signed)
 NAME:  MYRANDA PAVONE, MRN:  [id number], DOB:  [birth year], LOS: 2 ADMISSION DATE:  05/04/2023, CONSULTATION DATE:  05/05/23 REFERRING MD:  opyd, CHIEF COMPLAINT:  Respiratory Failure   History of Present Illness:  Ms. Rathel is a 73 y.o. F with PMH significant for COPD, former tobacco use, lung nodules (has seen Pulmonology, Dr. Maple Hudson), CKD 3a, DM, bipolar disorder, fibromyalgia who presented to the ED with three days of flu-like symptoms and confusion with loss of appetite.    She was placed on Bipap on arrival to the ED, work up was negative for Flu and Covid, no leukocytosis or lactic acidosis.  Despite Bipap she had worsening hypercarbic respiratory failure and mental status and required intubation   Pertinent  Medical History  has a past medical history of Allergy (Hayfever), Amaurosis fugax, Anemia, Anxiety, Arthritis, Asthma, Cataract, Chronic fatigue syndrome, Chronic kidney disease, COPD (chronic obstructive pulmonary disease) (HCC), COVID-19, Depression, Diverticulitis, Emphysema of lung (HCC), Fibromyalgia, GERD (gastroesophageal reflux disease), H/O hiatal hernia, History of bronchitis, Hyperlipidemia, Hypothyroidism, Interstitial cystitis, Irritable bowel syndrome, Lichen sclerosus, Lumbar herniated disc, Migraines, Motor nerve conduction block (11/2021), Osteoporosis (09/10/2022), Pneumonia, PONV (postoperative nausea and vomiting), Shortness of breath, Thyroid disease, Urinary frequency, Urinary tract infection, and Vertigo.   Significant Hospital Events: Including procedures, antibiotic start and stop dates in addition to other pertinent events   3/9 presented with hypercarbic respiratory failure, admit to [facility] then worsening hypercarbia and required intubation  3/9 Bilateral PE :  Small segmental and subsegmental bilateral lower lobe pulmonary emboli. No right heart strain. On heparin ggt  Interim History / Subjective:  Bilateral PE on CTA PE on 3/9  VSS - afebril  Vent:  92%, FIO2 50,  PRVC RR 25 PEEP 5  Peak 32 Mean airway 15 Plateau 26  Precedecx 1.2 Fentanil 150 Ceftriazone 1g  Objective   Blood pressure (!) 162/76, pulse 60, temperature 97.6 F (36.4 C), temperature source Oral, resp. rate (!) 25, height 5\' 7"  (1.702 m), weight 98.4 kg, SpO2 93%.    Vent Mode: PRVC FiO2 (%):  [50 %-60 %] 50 % Set Rate:  [25 bmp] 25 bmp Vt Set:  [490 mL-520 mL] 490 mL PEEP:  [5 cmH20] 5 cmH20 Plateau Pressure:  [24 cmH20-27 cmH20] 26 cmH20   Intake/Output Summary (Last 24 hours) at 05/06/2023 0818 Last data filed at 05/06/2023 0800 Gross per 24 hour  Intake 1464.93 ml  Output 606 ml  Net 858.93 ml   Filed Weights   05/05/23 0300 05/05/23 1900 05/06/23 0500  Weight: 101.7 kg 101.6 kg 98.4 kg    Examination: General: Chronically ill appearing woman in NAD HENT: Intubated. OG tube in place Lungs: Good air exchange, currently ventilated Cardiovascular: Regular rate Abdomen: Protuberant, normal bowel sounds Extremities: No pitting edema Neuro: Somnolent GU: Foley in place  Pertinent labs CBC  13.2  RDW 16.6  Cr 1.22 ( 1.02) GFR 47 < 48  MRSA seg Resolved Hospital Problem list     Assessment & Plan:  Acute hypoxic respiratory failure Bilateral segmental and subsegmental PE Bilateral lowel lobe consolidations  BL upper lobe consolidaiton COPD with emphysema, not on home supp O2 Mobility impaired woman with chronic inflammation due to severe RA now acute PNA. Will need to rule out LE DVT. Suspect she will need 3-6 months of AC followed by prophylactic AC to preven VTE in the future. - No heart strain on CT or EKG - TTE today, follow up results -Heparin drip; 3-6 months AC -Solumedrol  125mg  bid today followed by 40mg  per tube every day -Brovana, Yupelri and Pulmicort nebs -Ceftriaxone (3/9 - -Azithromycin 500 mg per tube (3/10 -3/12) -Follow up Urine strep pneumo and legionella Ag -respiratory panel and MRSA nares neg -blood cultures  , pending -Maintain full vent support with SAT/SBT as tolerated -titrate Vent setting to maintain SpO2 greater than or equal to 90% - Will attempt to wean on 3/11  AKI on Baseline CKD 3a Likely in setting of contrast load for PE CTA -creatinine near baseline, no UOP in the ED, place foley -trend renal indices -continue Mybetriq   Elevated BNP -BNP 200, Trop 94 -TTE today  Agitation ICU delirium risk Depression and anxiety Bipolar Disorder - Depakote and zoloft -Restarting Buspar, Wellbutrin, Remeron - PTA patient was also on Ativan 1 mg PRN; holding in setting of Versed  Type 2 DM -SSI -hold Zepbound   Rheumatoid Arthritis -hold home Prednisone 10mg  every day -on Enbrel injections -holding home Norco   HLD -continue statin   HTN - Hypertensive despite home Metoprolol XL 12.5mg , PRN Hydral and labetalol IV - Will start hydralazine 25 mg TID  Feeding - TF per OG tube ordered today  Best Practice (right click and "Reselect all SmartList Selections" daily)   Diet/type: tubefeeds DVT prophylaxis: systemic heparin GI prophylaxis: H2B Lines: N/A Foley:  Yes, and it is still needed Code Status:  full code Last date of multidisciplinary goals of care discussion [--]  Labs   CBC: Recent Labs  Lab 05/04/23 2137 05/04/23 2146 05/04/23 2339 05/05/23 0427 05/05/23 0623 05/06/23 0020  WBC 7.2  --   --  6.9  --  6.8  NEUTROABS 2.6  --   --   --   --   --   HGB 12.7 13.9 13.3 12.6 13.9 13.2  HCT 42.3 41.0 39.0 42.4 41.0 42.4  MCV 104.4*  --   --  106.3*  --  99.8  PLT 190  --   --  172  --  162    Basic Metabolic Panel: Recent Labs  Lab 05/04/23 2137 05/04/23 2146 05/04/23 2339 05/05/23 0427 05/05/23 0623 05/06/23 0020  NA 143 139 139 139 138 138  K 4.2 4.0 4.1 4.4 4.4 3.5  CL 96*  --   --  99  --  102  CO2 32  --   --  31  --  23  GLUCOSE 125*  --   --  154*  --  148*  BUN 24*  --   --  21  --  26*  CREATININE 1.17*  --   --  1.02*  --  1.22*   CALCIUM 9.1  --   --  8.3*  --  8.7*   GFR: Estimated Creatinine Clearance: 50.2 mL/min (A) (by C-G formula based on SCr of 1.22 mg/dL (H)). Recent Labs  Lab 05/04/23 2137 05/05/23 0427 05/06/23 0020  WBC 7.2 6.9 6.8  LATICACIDVEN 1.3  --   --     Liver Function Tests: Recent Labs  Lab 05/04/23 2137  AST 37  ALT 28  ALKPHOS 40  BILITOT 0.9  PROT 6.5  ALBUMIN 3.2*   No results for input(s): "LIPASE", "AMYLASE" in the last 168 hours. Recent Labs  Lab 05/04/23 2138  AMMONIA 35    ABG    Component Value Date/Time   PHART 7.416 05/05/2023 0623   PCO2ART 54.1 (H) 05/05/2023 0623   PO2ART 63 (L) 05/05/2023 0623   HCO3 34.7 (H) 05/05/2023  0623   TCO2 36 (H) 05/05/2023 0623   O2SAT 92 05/05/2023 0623     Coagulation Profile: No results for input(s): "INR", "PROTIME" in the last 168 hours.  Cardiac Enzymes: No results for input(s): "CKTOTAL", "CKMB", "CKMBINDEX", "TROPONINI" in the last 168 hours.  HbA1C: Hgb A1c MFr Bld  Date/Time Value Ref Range Status  05/05/2023 04:27 AM 5.6 4.8 - 5.6 % Final    Comment:    (NOTE) Pre diabetes:          5.7%-6.4%  Diabetes:              >6.4%  Glycemic control for   <7.0% adults with diabetes   07/13/2022 12:23 PM 5.2 4.6 - 6.5 % Final    Comment:    Glycemic Control Guidelines for People with Diabetes:Non Diabetic:  <6%Goal of Therapy: <7%Additional Action Suggested:  >8%     CBG: Recent Labs  Lab 05/05/23 1721 05/05/23 1931 05/06/23 0004 05/06/23 0339 05/06/23 0729  GLUCAP 146* 147* 155* 155* 134*    Review of Systems:     Past Medical History:  She,  has a past medical history of Allergy (Hayfever), Amaurosis fugax, Anemia, Anxiety, Arthritis, Asthma, Cataract, Chronic fatigue syndrome, Chronic kidney disease, COPD (chronic obstructive pulmonary disease) (HCC), COVID-19, Depression, Diverticulitis, Emphysema of lung (HCC), Fibromyalgia, GERD (gastroesophageal reflux disease), H/O hiatal hernia, History  of bronchitis, Hyperlipidemia, Hypothyroidism, Interstitial cystitis, Irritable bowel syndrome, Lichen sclerosus, Lumbar herniated disc, Migraines, Motor nerve conduction block (11/2021), Osteoporosis (09/10/2022), Pneumonia, PONV (postoperative nausea and vomiting), Shortness of breath, Thyroid disease, Urinary frequency, Urinary tract infection, and Vertigo.   Surgical History:   Past Surgical History:  Procedure Laterality Date   ABDOMINAL HYSTERECTOMY     bladder sugery      CAROTID DOPPLAR     COLONOSCOPY  05/26/2010   avms- otherwise nl , re check 10y   DEXA-OSTEOPENIA     DOPPLER ECHOCARDIOGRAPHY     elbow surgery     EPICONDYLITIS     EYE SURGERY Bilateral    cataracts   FOOT SURGERY Bilateral    I & D EXTREMITY Right 09/01/2020   Procedure: TYNOSYNOVECTOMY;  Surgeon: [name], MD;  Location: [facility] OR;  Service: Orthopedics;  Laterality: Right;   IMPLANTATION VAGAL NERVE STIMULATOR  03/2022   JOINT REPLACEMENT     KNEE SURGERY     Left cartilage   lipoma in second finger right hand     MOUTH SURGERY  08/22/2022   PLANTAR FASCIA SURGERY Left    REVERSE SHOULDER ARTHROPLASTY Left 02/02/2020   Procedure: LEFT REVERSE SHOULDER ARTHROPLASTY;  Surgeon: [name], MD;  Location: [facility] OR;  Service: Orthopedics;  Laterality: Left;   ROTATOR CUFF REPAIR Left 12/2017   TEMPOROMANDIBULAR JOINT SURGERY     TONSILLECTOMY     TOTAL HIP ARTHROPLASTY Right 03/20/2013   Procedure: Right TOTAL HIP ARTHROPLASTY;  Surgeon: [name], MD;  Location: [facility] OR;  Service: Orthopedics;  Laterality: Right;  Right Total Hip Arthroplasty   TOTAL KNEE ARTHROPLASTY Left 01/28/2015   Procedure: LEFT TOTAL KNEE ARTHROPLASTY;  Surgeon: [name], MD;  Location: [facility] ORS;  Service: Orthopedics;  Laterality: Left;   TRIGGER FINGER RELEASE Right    TROCHANTERIC BURSA EXCISION Right 05/03/2016   Dr. Doneen Poisson   WRIST ARTHROSCOPY Right    ligament tear      Social History:   reports that she quit smoking about 10 years ago. Her smoking use included cigarettes.  She started smoking about 53 years ago. She has a 86 pack-year smoking history. She has never been exposed to tobacco smoke. She has never used smokeless tobacco. She reports that she does not drink alcohol and does not use drugs.   Family History:  Her family history includes Arthritis in her maternal grandmother, mother, and sister; Cancer in her father; Cancer - Lung in her cousin; Colon cancer in an other family member; Hearing loss in her maternal grandfather; Heart disease in her mother and sister; Hypertension in her mother; Kidney disease in her mother; Stroke in her mother; Varicose Veins in her maternal uncle.   Allergies Allergies  Allergen Reactions   Lithium Anaphylaxis   Tegretol [Carbamazepine] Other (See Comments)    Fever and body aches (fever over 103)   Tricyclic Antidepressants Anaphylaxis    Other reaction(s): Unknown   Methotrexate Derivatives Other (See Comments)    Urinary retention and dizziness  Other reaction(s): Other UTI   Strawberry Extract Hives   Tapentadol Rash    PT ALLERGIC NYLON TAPE    Codeine Nausea Only    Makes pt stay awake   Cymbalta [Duloxetine Hcl] Other (See Comments)    Makes pt pass out    Erythromycin     abdominal pain   Lyrica [Pregabalin]     Felt faint   Neurontin [Gabapentin]     Passes  out   Rabeprazole Sodium     insomnia   Baclofen Nausea Only    Sever nausea   Duraprep [Antiseptic Products, Misc.] Itching and Rash    Tolerates Betadine    Penicillins Rash    Tolerated ANCEF 02/02/20  Has patient had a PCN reaction causing immediate rash, facial/tongue/throat swelling, SOB or lightheadedness with hypotension: Yes Has patient had a PCN reaction causing severe rash involving mucus membranes or skin necrosis: No Has patient had a PCN reaction that required hospitalization No Has patient had a PCN reaction  occurring within the last 10 years: No If all of the above answers are "NO", then may proceed with Cephalosporin use.    Tape Rash    PT ALLERGIC NYLON TAPE      Home Medications  Prior to Admission medications   Medication Sig Start Date End Date Taking? Authorizing Provider  acetaminophen (TYLENOL) 500 MG tablet Take 500 mg by mouth every 6 (six) hours as needed.   Yes [provider]  alendronate (FOSAMAX) 70 MG tablet TAKE 1 TABLET BY MOUTH ONCE A WEEK. TAKE WITH A FULL GLASS OF WATER ON AN EMPTY STOMACH. 02/11/23  Yes Rice, Jamesetta Orleans, MD  Black Cohosh 40 MG CAPS Take 1 capsule (40 mg total) by mouth daily. 05/25/21  Yes Constant, Peggy, MD  Budeson-Glycopyrrol-Formoterol (BREZTRI AEROSPHERE) 160-9-4.8 MCG/ACT AERO Inhale 2 puffs into the lungs 2 (two) times daily. 02/10/23  Yes Tower, Audrie Gallus, MD  buPROPion (WELLBUTRIN XL) 150 MG 24 hr tablet Take 150 mg by mouth daily.   Yes [provider]  busPIRone (BUSPAR) 15 MG tablet Take 15 mg by mouth 2 (two) times daily before a meal.   Yes [provider]  cholecalciferol (VITAMIN D) 25 MCG (1000 UNIT) tablet Take 2,000 Units by mouth daily. 2 tablets daily   Yes [provider]  clobetasol cream (TEMOVATE) 0.05 % APPLY SMALL AMOUNT TO AFFECTED AREAS TWO TO THREE TIMES PER WEEK 02/08/23  Yes Tower, Audrie Gallus, MD  cyanocobalamin 2000 MCG tablet Take 500 mcg by mouth daily.   Yes [provider]  diclofenac Sodium (VOLTAREN) 1 % GEL Apply 2 g topically 2 (two) times daily as needed (pain). 09/07/20  Yes Lynn Ito, MD  divalproex (DEPAKOTE ER) 500 MG 24 hr tablet Take 500 mg by mouth at bedtime.  11/02/19  Yes [provider]  etanercept (ENBREL SURECLICK) 50 MG/ML injection Inject 50 mg into the skin once a week. 04/22/23  Yes Rice, Jamesetta Orleans, MD  fluticasone (FLONASE) 50 MCG/ACT nasal spray PLACE 2 SPRAYS INTO THE NOSE DAILY. 12/19/21  Yes Young, Joni Fears D, MD  HYDROcodone-acetaminophen  (NORCO/VICODIN) 5-325 MG tablet Take 1 tablet by mouth 3 (three) times daily as needed for moderate pain. 11/20/22  Yes [name], MD  hydrocortisone cream 1 % Apply 1 Application topically 2 (two) times daily as needed for itching.   Yes [provider]  ibuprofen (ADVIL) 200 MG tablet Take 200 mg by mouth every 6 (six) hours as needed.   Yes [provider]  ipratropium (ATROVENT) 0.06 % nasal spray Place 2 sprays into both nostrils 4 (four) times daily. Patient states she uses 2 times per day.   Yes [provider]  ketotifen (ZADITOR) 0.025 % ophthalmic solution Place 1 drop into both eyes 2 (two) times daily as needed (allergies).   Yes [provider]  levothyroxine (SYNTHROID) 50 MCG tablet TAKE 1 TABLET BY MOUTH DAILY BEFORE BREAKFAST 04/16/23  Yes Tower, Marne A, MD  LORazepam (ATIVAN) 1 MG tablet Take 1 mg by mouth at bedtime as needed for anxiety or sleep. 06/28/20  Yes [provider]  metoprolol succinate (TOPROL-XL) 25 MG 24 hr tablet Take 0.5 tablets (12.5 mg total) by mouth daily. 02/12/23  Yes Tower, Audrie Gallus, MD  mirabegron ER (MYRBETRIQ) 25 MG TB24 tablet Take 1 tablet (25 mg total) by mouth in the morning and at bedtime. 04/29/22  Yes Tower, Audrie Gallus, MD  mirtazapine (REMERON) 30 MG tablet Take 30 mg by mouth at bedtime.  07/22/15  Yes [provider]  ondansetron (ZOFRAN-ODT) 4 MG disintegrating tablet Take 1 tablet (4 mg total) by mouth every 8 (eight) hours as needed for nausea or vomiting. 02/10/21  Yes Tower, Audrie Gallus, MD  predniSONE (DELTASONE) 10 MG tablet TAKE 1 TABLET (10 MG TOTAL) BY MOUTH DAILY WITH BREAKFAST. 04/23/23  Yes Rice, Jamesetta Orleans, MD  Rhubarb (ESTROVEN COMPLETE) 4 MG TABS Take 4 mg by mouth daily. 05/25/21  Yes Constant, Peggy, MD  sertraline (ZOLOFT) 100 MG tablet Take 100 mg by mouth daily.  12/06/14  Yes [provider]  simvastatin (ZOCOR) 20 MG tablet TAKE 1 TABLET BY MOUTH AT BEDTIME  04/29/23  Yes Tower, Audrie Gallus, MD  Spacer/Aero-Holding Chambers (AEROCHAMBER PLUS) inhaler Use with inhaler 06/29/21  Yes Domenick Gong, MD  traMADol (ULTRAM) 50 MG tablet Take 50 mg by mouth every 6 (six) hours as needed for moderate pain (pain score 4-6).   Yes [provider]  diphenhydrAMINE (BENADRYL) 25 MG tablet Take 25 mg by mouth every 6 (six) hours as needed for allergies. Patient not taking: Reported on 05/05/2023    [provider]  ipratropium-albuterol (DUONEB) 0.5-2.5 (3) MG/3ML SOLN Take 3 mLs by nebulization every 6 (six) hours as needed. Patient not taking: Reported on 05/05/2023 12/29/19   Jetty Duhamel D, MD  tirzepatide [facility]) 2.5 MG/0.5ML injection vial Inject 2.5 mg into the skin once a week. Patient not taking: Reported on 05/05/2023 03/24/23   [name], MD     Critical care  time:       Morene Crocker, MD [facility] Internal Medicine Program - PGY-2 05/06/2023, 8:18 AM Pager# [phone]

## 2023-05-07 DIAGNOSIS — N179 Acute kidney failure, unspecified: Secondary | ICD-10-CM | POA: Diagnosis not present

## 2023-05-07 DIAGNOSIS — J9602 Acute respiratory failure with hypercapnia: Secondary | ICD-10-CM | POA: Diagnosis not present

## 2023-05-07 DIAGNOSIS — J189 Pneumonia, unspecified organism: Secondary | ICD-10-CM | POA: Diagnosis not present

## 2023-05-07 DIAGNOSIS — J441 Chronic obstructive pulmonary disease with (acute) exacerbation: Secondary | ICD-10-CM | POA: Diagnosis not present

## 2023-05-07 LAB — MAGNESIUM
Magnesium: 3.1 mg/dL — ABNORMAL HIGH (ref 1.7–2.4)
Magnesium: 3.1 mg/dL — ABNORMAL HIGH (ref 1.7–2.4)

## 2023-05-07 LAB — BASIC METABOLIC PANEL
Anion gap: 10 (ref 5–15)
BUN: 48 mg/dL — ABNORMAL HIGH (ref 8–23)
CO2: 21 mmol/L — ABNORMAL LOW (ref 22–32)
Calcium: 8.2 mg/dL — ABNORMAL LOW (ref 8.9–10.3)
Chloride: 107 mmol/L (ref 98–111)
Creatinine, Ser: 1.1 mg/dL — ABNORMAL HIGH (ref 0.44–1.00)
GFR, Estimated: 53 mL/min — ABNORMAL LOW (ref >60)
Glucose, Bld: 136 mg/dL — ABNORMAL HIGH (ref 70–99)
Potassium: 3.5 mmol/L (ref 3.5–5.1)
Sodium: 138 mmol/L (ref 135–145)

## 2023-05-07 LAB — GLUCOSE, CAPILLARY
Glucose-Capillary: 105 mg/dL — ABNORMAL HIGH (ref 70–99)
Glucose-Capillary: 117 mg/dL — ABNORMAL HIGH (ref 70–99)
Glucose-Capillary: 122 mg/dL — ABNORMAL HIGH (ref 70–99)
Glucose-Capillary: 128 mg/dL — ABNORMAL HIGH (ref 70–99)
Glucose-Capillary: 176 mg/dL — ABNORMAL HIGH (ref 70–99)
Glucose-Capillary: 96 mg/dL (ref 70–99)
Glucose-Capillary: 96 mg/dL (ref 70–99)

## 2023-05-07 LAB — HEPARIN LEVEL (UNFRACTIONATED)
Heparin Unfractionated: 0.71 [IU]/mL — ABNORMAL HIGH (ref 0.30–0.70)
Heparin Unfractionated: 0.68 [IU]/mL (ref 0.30–0.70)

## 2023-05-07 LAB — CBC
HCT: 38.6 % (ref 36.0–46.0)
Hemoglobin: 12.4 g/dL (ref 12.0–15.0)
MCH: 31 pg (ref 26.0–34.0)
MCHC: 32.1 g/dL (ref 30.0–36.0)
MCV: 96.5 fL (ref 80.0–100.0)
Platelets: 179 10*3/uL (ref 150–400)
RBC: 4 MIL/uL (ref 3.87–5.11)
RDW: 17.8 % — ABNORMAL HIGH (ref 11.5–15.5)
WBC: 6.3 10*3/uL (ref 4.0–10.5)
nRBC: 0 % (ref 0.0–0.2)

## 2023-05-07 LAB — PHOSPHORUS
Phosphorus: 3.1 mg/dL (ref 2.5–4.6)
Phosphorus: 6.1 mg/dL — ABNORMAL HIGH (ref 2.5–4.6)

## 2023-05-07 MED ORDER — METOPROLOL TARTRATE 25 MG/10 ML ORAL SUSPENSION
6.2500 mg | Freq: Two times a day (BID) | ORAL | Status: DC
  Administered 2023-05-07 – 2023-05-08 (×2): 6.25 mg via ORAL
  Filled 2023-05-07 (×2): qty 5

## 2023-05-07 MED ORDER — MIRTAZAPINE 15 MG PO TABS
30.0000 mg | ORAL_TABLET | Freq: Every day | ORAL | Status: DC
  Administered 2023-05-07 – 2023-05-10 (×4): 30 mg via ORAL
  Filled 2023-05-07 (×4): qty 2

## 2023-05-07 MED ORDER — DOCUSATE SODIUM 100 MG PO CAPS
100.0000 mg | ORAL_CAPSULE | Freq: Two times a day (BID) | ORAL | Status: DC
  Filled 2023-05-07: qty 1

## 2023-05-07 MED ORDER — PREDNISONE 10 MG PO TABS
10.0000 mg | ORAL_TABLET | Freq: Every day | ORAL | Status: DC
Start: 2023-05-10 — End: 2023-05-11
  Administered 2023-05-10 – 2023-05-11 (×2): 10 mg via ORAL
  Filled 2023-05-07 (×2): qty 1

## 2023-05-07 MED ORDER — POLYETHYLENE GLYCOL 3350 17 G PO PACK: 17.0000 g | PACK | Freq: Every day | ORAL | Status: DC | PRN

## 2023-05-07 MED ORDER — LORAZEPAM 0.5 MG PO TABS
0.5000 mg | ORAL_TABLET | Freq: Two times a day (BID) | ORAL | Status: DC | PRN
  Administered 2023-05-07: 0.5 mg via ORAL
  Filled 2023-05-07: qty 1

## 2023-05-07 MED ORDER — BUSPIRONE HCL 5 MG PO TABS
15.0000 mg | ORAL_TABLET | Freq: Two times a day (BID) | ORAL | Status: DC
  Administered 2023-05-07 – 2023-05-11 (×8): 15 mg via ORAL
  Filled 2023-05-07 (×8): qty 1

## 2023-05-07 MED ORDER — ORAL CARE MOUTH RINSE: 15.0000 mL | OROMUCOSAL | Status: DC | PRN

## 2023-05-07 MED ORDER — POLYETHYLENE GLYCOL 3350 17 G PO PACK
17.0000 g | PACK | Freq: Two times a day (BID) | ORAL | Status: DC
Start: 2023-05-07 — End: 2023-05-11
  Administered 2023-05-09: 17 g via ORAL
  Filled 2023-05-07 (×6): qty 1

## 2023-05-07 MED ORDER — FAMOTIDINE 20 MG PO TABS: 20.0000 mg | ORAL_TABLET | Freq: Every day | ORAL | Status: DC

## 2023-05-07 MED ORDER — POLYETHYLENE GLYCOL 3350 17 G PO PACK: 17.0000 g | PACK | Freq: Two times a day (BID) | ORAL | Status: DC

## 2023-05-07 MED ORDER — SIMVASTATIN 20 MG PO TABS
20.0000 mg | ORAL_TABLET | Freq: Every day | ORAL | Status: DC
  Administered 2023-05-07 – 2023-05-10 (×4): 20 mg via ORAL
  Filled 2023-05-07 (×5): qty 1

## 2023-05-07 MED ORDER — DIVALPROEX SODIUM ER 500 MG PO TB24
500.0000 mg | ORAL_TABLET | Freq: Every day | ORAL | Status: DC
  Filled 2023-05-07 (×2): qty 1

## 2023-05-07 MED ORDER — LEVOTHYROXINE SODIUM 50 MCG PO TABS
50.0000 ug | ORAL_TABLET | Freq: Every day | ORAL | Status: DC
  Administered 2023-05-08 – 2023-05-11 (×4): 50 ug via ORAL
  Filled 2023-05-07 (×2): qty 1
  Filled 2023-05-07: qty 2
  Filled 2023-05-07: qty 1

## 2023-05-07 MED ORDER — PREDNISONE 20 MG PO TABS
40.0000 mg | ORAL_TABLET | Freq: Every day | ORAL | Status: AC
  Administered 2023-05-08 – 2023-05-09 (×2): 40 mg via ORAL
  Filled 2023-05-07 (×2): qty 2

## 2023-05-07 MED ORDER — ETANERCEPT 50 MG/ML ~~LOC~~ SOSY: 50.0000 mg | PREFILLED_SYRINGE | SUBCUTANEOUS | Status: DC

## 2023-05-07 MED ORDER — SERTRALINE HCL 100 MG PO TABS
100.0000 mg | ORAL_TABLET | Freq: Every day | ORAL | Status: DC
  Administered 2023-05-08 – 2023-05-11 (×4): 100 mg via ORAL
  Filled 2023-05-07: qty 1
  Filled 2023-05-07: qty 2
  Filled 2023-05-07 (×2): qty 1

## 2023-05-07 MED ORDER — FAMOTIDINE 20 MG PO TABS
20.0000 mg | ORAL_TABLET | Freq: Every day | ORAL | Status: DC
  Administered 2023-05-09 – 2023-05-11 (×3): 20 mg via ORAL
  Filled 2023-05-07 (×4): qty 1

## 2023-05-07 MED ORDER — VALPROIC ACID 250 MG/5ML PO SOLN
250.0000 mg | Freq: Two times a day (BID) | ORAL | Status: AC
  Administered 2023-05-07: 250 mg via ORAL
  Filled 2023-05-07: qty 5

## 2023-05-07 MED ORDER — AZITHROMYCIN 500 MG PO TABS
500.0000 mg | ORAL_TABLET | Freq: Every day | ORAL | Status: AC
  Administered 2023-05-08: 500 mg via ORAL
  Filled 2023-05-07: qty 1

## 2023-05-07 MED ORDER — BUPROPION HCL 75 MG PO TABS
75.0000 mg | ORAL_TABLET | Freq: Two times a day (BID) | ORAL | Status: DC
  Administered 2023-05-07 – 2023-05-11 (×8): 75 mg via ORAL
  Filled 2023-05-07 (×10): qty 1

## 2023-05-07 NOTE — Plan of Care (Signed)
  Problem: Education: Goal: Knowledge of General Education information will improve Description: Including pain rating scale, medication(s)/side effects and non-pharmacologic comfort measures Outcome: Progressing   Problem: Clinical Measurements: Goal: Ability to maintain clinical measurements within normal limits will improve Outcome: Progressing Goal: Will remain free from infection Outcome: Progressing Goal: Diagnostic test results will improve Outcome: Progressing Goal: Respiratory complications will improve Outcome: Progressing   Problem: Nutrition: Goal: Adequate nutrition will be maintained Outcome: Progressing   Problem: Pain Managment: Goal: General experience of comfort will improve and/or be controlled Outcome: Progressing

## 2023-05-07 NOTE — Progress Notes (Addendum)
 PHARMACY - ANTICOAGULATION CONSULT NOTE  Pharmacy Consult for heparin Indication: pulmonary embolus  Allergies  Allergen Reactions   Lithium Anaphylaxis   Tegretol [Carbamazepine] Other (See Comments)    Fever and body aches (fever over 103)   Tricyclic Antidepressants Anaphylaxis    Other reaction(s): Unknown   Methotrexate Derivatives Other (See Comments)    Urinary retention and dizziness  Other reaction(s): Other UTI   Strawberry Extract Hives   Tapentadol Rash    PT ALLERGIC NYLON TAPE    Codeine Nausea Only    Makes pt stay awake   Cymbalta [Duloxetine Hcl] Other (See Comments)    Makes pt pass out    Erythromycin     abdominal pain   Lyrica [Pregabalin]     Felt faint   Neurontin [Gabapentin]     Passes  out   Rabeprazole Sodium     insomnia   Baclofen Nausea Only    Sever nausea   Duraprep [Antiseptic Products, Misc.] Itching and Rash    Tolerates Betadine    Penicillins Rash    Tolerated ANCEF 02/02/20  Has patient had a PCN reaction causing immediate rash, facial/tongue/throat swelling, SOB or lightheadedness with hypotension: Yes Has patient had a PCN reaction causing severe rash involving mucus membranes or skin necrosis: No Has patient had a PCN reaction that required hospitalization No Has patient had a PCN reaction occurring within the last 10 years: No If all of the above answers are "NO", then may proceed with Cephalosporin use.    Tape Rash    PT ALLERGIC NYLON TAPE     Patient Measurements: Height: 5\' 7"  (170.2 cm) Weight: 99.9 kg (220 lb 3.8 oz) IBW/kg (Calculated) : 61.6 Heparin Dosing Weight: 84.4 kg  Vital Signs: Temp: 96 F (35.6 C) (03/11 0800) Temp Source: Axillary (03/11 0800) BP: 114/88 (03/11 1030) Pulse Rate: 81 (03/11 1030)  Labs: Recent Labs    05/04/23 2137 05/04/23 2146 05/04/23 2325 05/04/23 2339 05/05/23 0427 05/05/23 1610 05/06/23 0020 05/06/23 0427 05/06/23 0427 05/06/23 1416 05/07/23 0114 05/07/23 0547  05/07/23 1006  HGB 12.7   < >  --    < > 12.6 13.9 13.2  --   --   --   --  12.4  --   HCT 42.3   < >  --    < > 42.4 41.0 42.4  --   --   --   --  38.6  --   PLT 190  --   --   --  172  --  162  --   --   --   --  179  --   APTT  --   --   --   --   --   --   --  121*  --   --   --   --   --   HEPARINUNFRC  --   --   --   --   --   --   --  >1.10*   < > 1.10* 0.71*  --  0.68  CREATININE 1.17*  --   --   --  1.02*  --  1.22*  --   --   --   --  1.10*  --   TROPONINIHS 54*  --  75*  --  94*  --   --   --   --   --   --   --   --    < > =  values in this interval not displayed.    Estimated Creatinine Clearance: 56.1 mL/min (A) (by C-G formula based on SCr of 1.1 mg/dL (H)).  Assessment: 82 yoF with bilateral pulmonary emboli without evidence of heart strain. No oral anticoagulation reported prior to admission. 3/10 doppler ultrasound negative for DVT.  Pharmacy consulted to manage heparin infusion. Patient currently remains intubated, hopeful for extubation soon if able to pass SBT/SAT.  Heparin was decreased to 950 units/hr, now therapeutic with HL 0.68. Note of abrasions but no other findings of bleeding. CBC remains stable.   Goal of Therapy:  Heparin level 0.3-0.7 units/ml Monitor platelets by anticoagulation protocol: Yes   Plan:  Continue heparin infusion 950 units/hr Daily anti-Xa while on heparin Daily H&H and platelets F/u transition to DOAC for total 3-6 months  Rutherford Nail, PharmD PGY2 Critical Care Pharmacy Resident 05/07/2023,10:57 AM

## 2023-05-07 NOTE — Progress Notes (Signed)
 PHARMACY - ANTICOAGULATION CONSULT NOTE  Pharmacy Consult for heparin Indication: pulmonary embolus  Allergies  Allergen Reactions   Lithium Anaphylaxis   Tegretol [Carbamazepine] Other (See Comments)    Fever and body aches (fever over 103)   Tricyclic Antidepressants Anaphylaxis    Other reaction(s): Unknown   Methotrexate Derivatives Other (See Comments)    Urinary retention and dizziness  Other reaction(s): Other UTI   Strawberry Extract Hives   Tapentadol Rash    PT ALLERGIC NYLON TAPE    Codeine Nausea Only    Makes pt stay awake   Cymbalta [Duloxetine Hcl] Other (See Comments)    Makes pt pass out    Erythromycin     abdominal pain   Lyrica [Pregabalin]     Felt faint   Neurontin [Gabapentin]     Passes  out   Rabeprazole Sodium     insomnia   Baclofen Nausea Only    Sever nausea   Duraprep [Antiseptic Products, Misc.] Itching and Rash    Tolerates Betadine    Penicillins Rash    Tolerated ANCEF 02/02/20  Has patient had a PCN reaction causing immediate rash, facial/tongue/throat swelling, SOB or lightheadedness with hypotension: Yes Has patient had a PCN reaction causing severe rash involving mucus membranes or skin necrosis: No Has patient had a PCN reaction that required hospitalization No Has patient had a PCN reaction occurring within the last 10 years: No If all of the above answers are "NO", then may proceed with Cephalosporin use.    Tape Rash    PT ALLERGIC NYLON TAPE     Patient Measurements: Height: 5\' 7"  (170.2 cm) Weight: 98.4 kg (216 lb 14.9 oz) IBW/kg (Calculated) : 61.6 Heparin Dosing Weight: 84.4 kg  Vital Signs: Temp: 97.4 F (36.3 C) (03/11 0000) Temp Source: Axillary (03/11 0000) BP: 126/55 (03/11 0030) Pulse Rate: 55 (03/11 0030)  Labs: Recent Labs    05/04/23 2137 05/04/23 2146 05/04/23 2325 05/04/23 2339 05/05/23 0427 05/05/23 0623 05/06/23 0020 05/06/23 0427 05/06/23 1416 05/07/23 0114  HGB 12.7   < >  --    < >  12.6 13.9 13.2  --   --   --   HCT 42.3   < >  --    < > 42.4 41.0 42.4  --   --   --   PLT 190  --   --   --  172  --  162  --   --   --   APTT  --   --   --   --   --   --   --  121*  --   --   HEPARINUNFRC  --   --   --   --   --   --   --  >1.10* 1.10* 0.71*  CREATININE 1.17*  --   --   --  1.02*  --  1.22*  --   --   --   TROPONINIHS 54*  --  75*  --  94*  --   --   --   --   --    < > = values in this interval not displayed.    Estimated Creatinine Clearance: 50.2 mL/min (A) (by C-G formula based on SCr of 1.22 mg/dL (H)).   Assessment: 48 yoF with bilateral pulmonary emboli without evidence of heart strain. No oral anticoagulation reported prior to admission, however the patient did receive one dose of Lovenox 40mg  around 1100 this  morning. CBC WNL. Pharmacy consulted to manage heparin infusion.    3/11 AM update:  Heparin level supra-therapeutic   Goal of Therapy:  Heparin level 0.3-0.7 units/ml Monitor platelets by anticoagulation protocol: Yes   Plan:  Dec heparin to 950 units/hr Heparin level in 8 hours  Abran Duke, PharmD, BCPS Clinical Pharmacist Phone: [phone]

## 2023-05-07 NOTE — Progress Notes (Signed)
 NAME:  Lori Orozco, MRN:  [id number], DOB:  [birth year], LOS: 3 ADMISSION DATE:  05/04/2023, CONSULTATION DATE:  05/05/23 REFERRING MD:  opyd, CHIEF COMPLAINT:  Respiratory Failure   History of Present Illness:  Lori Orozco is a 73 y.o. F with PMH significant for COPD, former tobacco use, lung nodules (has seen Pulmonology, Dr. Maple Hudson), CKD 3a, DM, bipolar disorder, fibromyalgia who presented to the ED with three days of flu-like symptoms and confusion with loss of appetite.   She was placed on Bipap on arrival to the ED, work up was negative for Flu and Covid, no leukocytosis or lactic acidosis.  Despite Bipap she had worsening hypercarbic respiratory failure and mental status and required intubation   Pertinent  Medical History  has a past medical history of Allergy (Hayfever), Amaurosis fugax, Anemia, Anxiety, Arthritis, Asthma, Cataract, Chronic fatigue syndrome, Chronic kidney disease, COPD (chronic obstructive pulmonary disease) (HCC), COVID-19, Depression, Diverticulitis, Emphysema of lung (HCC), Fibromyalgia, GERD (gastroesophageal reflux disease), H/O hiatal hernia, History of bronchitis, Hyperlipidemia, Hypothyroidism, Interstitial cystitis, Irritable bowel syndrome, Lichen sclerosus, Lumbar herniated disc, Migraines, Motor nerve conduction block (11/2021), Osteoporosis (09/10/2022), Pneumonia, PONV (postoperative nausea and vomiting), Shortness of breath, Thyroid disease, Urinary frequency, Urinary tract infection, and Vertigo.   Significant Hospital Events: Including procedures, antibiotic start and stop dates in addition to other pertinent events   3/9 presented with hypercarbic respiratory failure, admit to [facility] then worsening hypercarbia and required intubation  3/9 Bilateral PE :  Small segmental and subsegmental bilateral lower lobe pulmonary emboli. No right heart strain. On heparin ggt 3/10: LV EF 60-65%. No RWM abnormalities. G1DD. Mild concentric hypertrophy. No shunts  detected by doppler. 3/10 Lower extremity doppler ultrasound - negative for DVT  Interim History / Subjective:  Bilateral PE on CTA PE on 3/9  VSS - afebrile on ventilator  Peak 24 Mean airway 15 Plateau 20  Precedecx 1.2 Fentanil 200 Clear secretions Continues on Ceftriaxone 1g and Azithromycin  Restless in bed.  Objective   Blood pressure (!) 154/126, pulse (!) 55, temperature (!) 96 F (35.6 C), temperature source Axillary, resp. rate (!) 25, height 5\' 7"  (1.702 m), weight 99.9 kg, SpO2 93%.    Vent Mode: PRVC FiO2 (%):  [40 %-50 %] 40 % Set Rate:  [25 bmp] 25 bmp Vt Set:  [490 mL] 490 mL PEEP:  [5 cmH20] 5 cmH20 Plateau Pressure:  [20 cmH20-25 cmH20] 20 cmH20   Intake/Output Summary (Last 24 hours) at 05/07/2023 0815 Last data filed at 05/07/2023 0600 Gross per 24 hour  Intake 2230.56 ml  Output 430 ml  Net 1800.56 ml   Filed Weights   05/05/23 1900 05/06/23 0500 05/07/23 0419  Weight: 101.6 kg 98.4 kg 99.9 kg    Examination: General: Chronically ill appearing woman in NAD HENT: Intubated. OG tube in place Lungs: Good air exchange, currently ventilated Cardiovascular: Regular rate Abdomen: normal bowel sounds Extremities: No pitting edema Neuro: Somnolent; sticks tongue out and squeezes fingers after directed, otherwise, moving all extremities/disoriented GU: Foley in place  Pertinent labs CBC normal, stable BMP Cr . 1.22 < 1.02 GFR 47 <58  3/10: LV EF 60-65%. No RWM abnormalities. G1DD. Mild concentric hypertrophy. No shunts detected by doppler. 3/10 Lower extremity doppler ultrasound - negative for DVT Resolved Hospital Problem list     Assessment & Plan:  Acute hypoxic respiratory failure Bilateral segmental and subsegmental PE Bilateral lowel lobe consolidations  BL upper lobe consolidaiton COPD with emphysema, not on home supp O2 Mobility  impaired woman with chronic inflammation due to severe RA now acute PNA. Attempting SBT today; patient  with trouble initiating breaths likely in the setting of low CO2 in this COPD patient vs sedation effect.  - Weaning all sedation - No heart strain on CT or EKG, and TTE - Heparin drip; 3-6 months AC - Solumedrol 125mg  bid today followed by 40mg  per tube every day - Brovana, Yupelri and Pulmicort nebs - Ceftriaxone (3/9 - - Azithromycin 500 mg per tube (3/10 -3/12) - Follow up Urine strep pneumo and legionella Ag  -respiratory panel and MRSA nares neg - blood cultures , NGTD  Baseline CKD 3a Baseline Cr: 1-1.14 per record Minimal urine output during this hospitalization.  -Foley in place; will bladder scan today -trend renal indices -continue Mybetriq   Elevated BNP -BNP 200, Trop 94 -TTE today  Agitation ICU delirium risk Depression and anxiety Bipolar Disorder - Depakote and zoloft -Restarting Buspar, Wellbutrin, Remeron - PTA patient was also on Ativan 1 mg PRN; holding in setting of Versed  Type 2 DM -SSI -hold Zepbound   Rheumatoid Arthritis -hold home Prednisone 10mg  every day -on Enbrel injections -holding home Norco   HLD -continue statin   HTN - Hypertensive despite home Metoprolol XL 12.5mg , PRN Hydral and labetalol IV - Will start hydralazine 25 mg TID  Feeding - TF per OG tube ordered today  Best Practice (right click and "Reselect all SmartList Selections" daily)   Diet/type: tubefeeds DVT prophylaxis: systemic heparin GI prophylaxis: H2B Lines: N/A Foley:  Yes, and it is still needed Code Status:  full code Last date of multidisciplinary goals of care discussion [--]  Labs   CBC: Recent Labs  Lab 05/04/23 2137 05/04/23 2146 05/04/23 2339 05/05/23 0427 05/05/23 0623 05/06/23 0020 05/07/23 0547  WBC 7.2  --   --  6.9  --  6.8 6.3  NEUTROABS 2.6  --   --   --   --   --   --   HGB 12.7   < > 13.3 12.6 13.9 13.2 12.4  HCT 42.3   < > 39.0 42.4 41.0 42.4 38.6  MCV 104.4*  --   --  106.3*  --  99.8 96.5  PLT 190  --   --  172  --  162  179   < > = values in this interval not displayed.    Basic Metabolic Panel: Recent Labs  Lab 05/04/23 2137 05/04/23 2146 05/04/23 2339 05/05/23 0427 05/05/23 0623 05/06/23 0020 05/06/23 1049 05/06/23 2042 05/07/23 0547  NA 143   < > 139 139 138 138  --   --  138  K 4.2   < > 4.1 4.4 4.4 3.5  --   --  3.5  CL 96*  --   --  99  --  102  --   --  107  CO2 32  --   --  31  --  23  --   --  21*  GLUCOSE 125*  --   --  154*  --  148*  --   --  136*  BUN 24*  --   --  21  --  26*  --   --  48*  CREATININE 1.17*  --   --  1.02*  --  1.22*  --   --  1.10*  CALCIUM 9.1  --   --  8.3*  --  8.7*  --   --  8.2*  MG  --   --   --   --   --   --  1.7 1.8 3.1*  PHOS  --   --   --   --   --   --  3.4 2.7 3.1   < > = values in this interval not displayed.   GFR: Estimated Creatinine Clearance: 56.1 mL/min (A) (by C-G formula based on SCr of 1.1 mg/dL (H)). Recent Labs  Lab 05/04/23 2137 05/05/23 0427 05/06/23 0020 05/07/23 0547  WBC 7.2 6.9 6.8 6.3  LATICACIDVEN 1.3  --   --   --     Liver Function Tests: Recent Labs  Lab 05/04/23 2137  AST 37  ALT 28  ALKPHOS 40  BILITOT 0.9  PROT 6.5  ALBUMIN 3.2*   No results for input(s): "LIPASE", "AMYLASE" in the last 168 hours. Recent Labs  Lab 05/04/23 2138  AMMONIA 35    ABG    Component Value Date/Time   PHART 7.416 05/05/2023 0623   PCO2ART 54.1 (H) 05/05/2023 0623   PO2ART 63 (L) 05/05/2023 0623   HCO3 34.7 (H) 05/05/2023 0623   TCO2 36 (H) 05/05/2023 0623   O2SAT 92 05/05/2023 0623     Coagulation Profile: No results for input(s): "INR", "PROTIME" in the last 168 hours.  Cardiac Enzymes: No results for input(s): "CKTOTAL", "CKMB", "CKMBINDEX", "TROPONINI" in the last 168 hours.  HbA1C: Hgb A1c MFr Bld  Date/Time Value Ref Range Status  05/05/2023 04:27 AM 5.6 4.8 - 5.6 % Final    Comment:    (NOTE) Pre diabetes:          5.7%-6.4%  Diabetes:              >6.4%  Glycemic control for   <7.0% adults with  diabetes   07/13/2022 12:23 PM 5.2 4.6 - 6.5 % Final    Comment:    Glycemic Control Guidelines for People with Diabetes:Non Diabetic:  <6%Goal of Therapy: <7%Additional Action Suggested:  >8%     CBG: Recent Labs  Lab 05/06/23 1548 05/06/23 1939 05/06/23 2358 05/07/23 0354 05/07/23 0803  GLUCAP 137* 138* 176* 128* 122*    Review of Systems:     Past Medical History:  She,  has a past medical history of Allergy (Hayfever), Amaurosis fugax, Anemia, Anxiety, Arthritis, Asthma, Cataract, Chronic fatigue syndrome, Chronic kidney disease, COPD (chronic obstructive pulmonary disease) (HCC), COVID-19, Depression, Diverticulitis, Emphysema of lung (HCC), Fibromyalgia, GERD (gastroesophageal reflux disease), H/O hiatal hernia, History of bronchitis, Hyperlipidemia, Hypothyroidism, Interstitial cystitis, Irritable bowel syndrome, Lichen sclerosus, Lumbar herniated disc, Migraines, Motor nerve conduction block (11/2021), Osteoporosis (09/10/2022), Pneumonia, PONV (postoperative nausea and vomiting), Shortness of breath, Thyroid disease, Urinary frequency, Urinary tract infection, and Vertigo.   Surgical History:   Past Surgical History:  Procedure Laterality Date   ABDOMINAL HYSTERECTOMY     bladder sugery      CAROTID DOPPLAR     COLONOSCOPY  05/26/2010   avms- otherwise nl , re check 10y   DEXA-OSTEOPENIA     DOPPLER ECHOCARDIOGRAPHY     elbow surgery     EPICONDYLITIS     EYE SURGERY Bilateral    cataracts   FOOT SURGERY Bilateral    I & D EXTREMITY Right 09/01/2020   Procedure: TYNOSYNOVECTOMY;  Surgeon: [name], MD;  Location: [facility] OR;  Service: Orthopedics;  Laterality: Right;   IMPLANTATION VAGAL NERVE STIMULATOR  03/2022   JOINT REPLACEMENT     KNEE SURGERY     Left cartilage   lipoma in second finger right hand     MOUTH SURGERY  08/22/2022   PLANTAR FASCIA SURGERY Left    REVERSE SHOULDER ARTHROPLASTY Left 02/02/2020   Procedure: LEFT REVERSE SHOULDER  ARTHROPLASTY;  Surgeon: [name], MD;  Location: [facility] OR;  Service: Orthopedics;  Laterality: Left;   ROTATOR CUFF REPAIR Left 12/2017   TEMPOROMANDIBULAR JOINT SURGERY     TONSILLECTOMY     TOTAL HIP ARTHROPLASTY Right 03/20/2013   Procedure: Right TOTAL HIP ARTHROPLASTY;  Surgeon: [name], MD;  Location: [facility] OR;  Service: Orthopedics;  Laterality: Right;  Right Total Hip Arthroplasty   TOTAL KNEE ARTHROPLASTY Left 01/28/2015   Procedure: LEFT TOTAL KNEE ARTHROPLASTY;  Surgeon: [name], MD;  Location: [facility] ORS;  Service: Orthopedics;  Laterality: Left;   TRIGGER FINGER RELEASE Right    TROCHANTERIC BURSA EXCISION Right 05/03/2016   Dr. Doneen Poisson   WRIST ARTHROSCOPY Right    ligament tear     Social History:   reports that she quit smoking about 10 years ago. Her smoking use included cigarettes. She started smoking about 53 years ago. She has a 86 pack-year smoking history. She has never been exposed to tobacco smoke. She has never used smokeless tobacco. She reports that she does not drink alcohol and does not use drugs.   Family History:  Her family history includes Arthritis in her maternal grandmother, mother, and sister; Cancer in her father; Cancer - Lung in her cousin; Colon cancer in an other family member; Hearing loss in her maternal grandfather; Heart disease in her mother and sister; Hypertension in her mother; Kidney disease in her mother; Stroke in her mother; Varicose Veins in her maternal uncle.   Allergies Allergies  Allergen Reactions   Lithium Anaphylaxis   Tegretol [Carbamazepine] Other (See Comments)    Fever and body aches (fever over 103)   Tricyclic Antidepressants Anaphylaxis    Other reaction(s): Unknown   Methotrexate Derivatives Other (See Comments)    Urinary retention and dizziness  Other reaction(s): Other UTI   Strawberry Extract Hives   Tapentadol Rash    PT ALLERGIC NYLON TAPE    Codeine Nausea Only    Makes  pt stay awake   Cymbalta [Duloxetine Hcl] Other (See Comments)    Makes pt pass out    Erythromycin     abdominal pain   Lyrica [Pregabalin]     Felt faint   Neurontin [Gabapentin]     Passes  out   Rabeprazole Sodium     insomnia   Baclofen Nausea Only    Sever nausea   Duraprep [Antiseptic Products, Misc.] Itching and Rash    Tolerates Betadine    Penicillins Rash    Tolerated ANCEF 02/02/20  Has patient had a PCN reaction causing immediate rash, facial/tongue/throat swelling, SOB or lightheadedness with hypotension: Yes Has patient had a PCN reaction causing severe rash involving mucus membranes or skin necrosis: No Has patient had a PCN reaction that required hospitalization No Has patient had a PCN reaction occurring within the last 10 years: No If all of the above answers are "NO", then may proceed with Cephalosporin use.    Tape Rash    PT ALLERGIC NYLON TAPE      Home Medications  Prior to Admission medications   Medication Sig Start Date End Date Taking? Authorizing Provider  acetaminophen (TYLENOL) 500 MG tablet Take 500 mg by mouth every 6 (six) hours as needed.   Yes [provider]  alendronate (FOSAMAX) 70 MG tablet TAKE 1 TABLET BY MOUTH ONCE  A WEEK. TAKE WITH A FULL GLASS OF WATER ON AN EMPTY STOMACH. 02/11/23  Yes Rice, Jamesetta Orleans, MD  Black Cohosh 40 MG CAPS Take 1 capsule (40 mg total) by mouth daily. 05/25/21  Yes Constant, Peggy, MD  Budeson-Glycopyrrol-Formoterol (BREZTRI AEROSPHERE) 160-9-4.8 MCG/ACT AERO Inhale 2 puffs into the lungs 2 (two) times daily. 02/10/23  Yes Tower, Audrie Gallus, MD  buPROPion (WELLBUTRIN XL) 150 MG 24 hr tablet Take 150 mg by mouth daily.   Yes [provider]  busPIRone (BUSPAR) 15 MG tablet Take 15 mg by mouth 2 (two) times daily before a meal.   Yes [provider]  cholecalciferol (VITAMIN D) 25 MCG (1000 UNIT) tablet Take 2,000 Units by mouth daily. 2 tablets daily   Yes [provider]   clobetasol cream (TEMOVATE) 0.05 % APPLY SMALL AMOUNT TO AFFECTED AREAS TWO TO THREE TIMES PER WEEK 02/08/23  Yes Tower, Audrie Gallus, MD  cyanocobalamin 2000 MCG tablet Take 500 mcg by mouth daily.   Yes [provider]  diclofenac Sodium (VOLTAREN) 1 % GEL Apply 2 g topically 2 (two) times daily as needed (pain). 09/07/20  Yes Lynn Ito, MD  divalproex (DEPAKOTE ER) 500 MG 24 hr tablet Take 500 mg by mouth at bedtime.  11/02/19  Yes [provider]  etanercept (ENBREL SURECLICK) 50 MG/ML injection Inject 50 mg into the skin once a week. 04/22/23  Yes Rice, Jamesetta Orleans, MD  fluticasone (FLONASE) 50 MCG/ACT nasal spray PLACE 2 SPRAYS INTO THE NOSE DAILY. 12/19/21  Yes Young, Joni Fears D, MD  HYDROcodone-acetaminophen (NORCO/VICODIN) 5-325 MG tablet Take 1 tablet by mouth 3 (three) times daily as needed for moderate pain. 11/20/22  Yes [name], MD  hydrocortisone cream 1 % Apply 1 Application topically 2 (two) times daily as needed for itching.   Yes [provider]  ibuprofen (ADVIL) 200 MG tablet Take 200 mg by mouth every 6 (six) hours as needed.   Yes [provider]  ipratropium (ATROVENT) 0.06 % nasal spray Place 2 sprays into both nostrils 4 (four) times daily. Patient states she uses 2 times per day.   Yes [provider]  ketotifen (ZADITOR) 0.025 % ophthalmic solution Place 1 drop into both eyes 2 (two) times daily as needed (allergies).   Yes [provider]  levothyroxine (SYNTHROID) 50 MCG tablet TAKE 1 TABLET BY MOUTH DAILY BEFORE BREAKFAST 04/16/23  Yes Tower, Marne A, MD  LORazepam (ATIVAN) 1 MG tablet Take 1 mg by mouth at bedtime as needed for anxiety or sleep. 06/28/20  Yes [provider]  metoprolol succinate (TOPROL-XL) 25 MG 24 hr tablet Take 0.5 tablets (12.5 mg total) by mouth daily. 02/12/23  Yes Tower, Audrie Gallus, MD  mirabegron ER (MYRBETRIQ) 25 MG TB24 tablet Take 1 tablet (25 mg total) by mouth in the  morning and at bedtime. 04/29/22  Yes Tower, Audrie Gallus, MD  mirtazapine (REMERON) 30 MG tablet Take 30 mg by mouth at bedtime.  07/22/15  Yes [provider]  ondansetron (ZOFRAN-ODT) 4 MG disintegrating tablet Take 1 tablet (4 mg total) by mouth every 8 (eight) hours as needed for nausea or vomiting. 02/10/21  Yes Tower, Audrie Gallus, MD  predniSONE (DELTASONE) 10 MG tablet TAKE 1 TABLET (10 MG TOTAL) BY MOUTH DAILY WITH BREAKFAST. 04/23/23  Yes Rice, Jamesetta Orleans, MD  Rhubarb (ESTROVEN COMPLETE) 4 MG TABS Take 4 mg by mouth daily. 05/25/21  Yes Constant, Peggy, MD  sertraline (ZOLOFT) 100 MG tablet  Take 100 mg by mouth daily.  12/06/14  Yes [provider]  simvastatin (ZOCOR) 20 MG tablet TAKE 1 TABLET BY MOUTH AT BEDTIME 04/29/23  Yes Tower, Audrie Gallus, MD  Spacer/Aero-Holding Chambers (AEROCHAMBER PLUS) inhaler Use with inhaler 06/29/21  Yes Domenick Gong, MD  traMADol (ULTRAM) 50 MG tablet Take 50 mg by mouth every 6 (six) hours as needed for moderate pain (pain score 4-6).   Yes [provider]  diphenhydrAMINE (BENADRYL) 25 MG tablet Take 25 mg by mouth every 6 (six) hours as needed for allergies. Patient not taking: Reported on 05/05/2023    [provider]  ipratropium-albuterol (DUONEB) 0.5-2.5 (3) MG/3ML SOLN Take 3 mLs by nebulization every 6 (six) hours as needed. Patient not taking: Reported on 05/05/2023 12/29/19   Jetty Duhamel D, MD  tirzepatide [facility]) 2.5 MG/0.5ML injection vial Inject 2.5 mg into the skin once a week. Patient not taking: Reported on 05/05/2023 03/24/23   [name], MD     Critical care time:       Morene Crocker, MD [facility] Internal Medicine Program - PGY-2 05/07/2023, 8:15 AM Pager# [phone]

## 2023-05-08 DIAGNOSIS — J9601 Acute respiratory failure with hypoxia: Secondary | ICD-10-CM | POA: Diagnosis not present

## 2023-05-08 DIAGNOSIS — J441 Chronic obstructive pulmonary disease with (acute) exacerbation: Secondary | ICD-10-CM | POA: Diagnosis not present

## 2023-05-08 DIAGNOSIS — N179 Acute kidney failure, unspecified: Secondary | ICD-10-CM | POA: Diagnosis not present

## 2023-05-08 LAB — GLUCOSE, CAPILLARY
Glucose-Capillary: 76 mg/dL (ref 70–99)
Glucose-Capillary: 73 mg/dL (ref 70–99)
Glucose-Capillary: 93 mg/dL (ref 70–99)
Glucose-Capillary: 91 mg/dL (ref 70–99)
Glucose-Capillary: 80 mg/dL (ref 70–99)

## 2023-05-08 LAB — CBC
HCT: 42 % (ref 36.0–46.0)
Hemoglobin: 12.8 g/dL (ref 12.0–15.0)
MCH: 31.1 pg (ref 26.0–34.0)
MCHC: 30.5 g/dL (ref 30.0–36.0)
MCV: 101.9 fL — ABNORMAL HIGH (ref 80.0–100.0)
Platelets: 212 10*3/uL (ref 150–400)
RBC: 4.12 MIL/uL (ref 3.87–5.11)
RDW: 18.4 % — ABNORMAL HIGH (ref 11.5–15.5)
WBC: 10.1 10*3/uL (ref 4.0–10.5)
nRBC: 0 % (ref 0.0–0.2)

## 2023-05-08 LAB — HEPARIN LEVEL (UNFRACTIONATED): Heparin Unfractionated: 0.6 [IU]/mL (ref 0.30–0.70)

## 2023-05-08 LAB — CULTURE, RESPIRATORY W GRAM STAIN: Culture: NORMAL

## 2023-05-08 LAB — BASIC METABOLIC PANEL
Anion gap: 8 (ref 5–15)
BUN: 56 mg/dL — ABNORMAL HIGH (ref 8–23)
CO2: 25 mmol/L (ref 22–32)
Calcium: 8.8 mg/dL — ABNORMAL LOW (ref 8.9–10.3)
Chloride: 105 mmol/L (ref 98–111)
Creatinine, Ser: 1.27 mg/dL — ABNORMAL HIGH (ref 0.44–1.00)
GFR, Estimated: 45 mL/min — ABNORMAL LOW (ref >60)
Glucose, Bld: 73 mg/dL (ref 70–99)
Potassium: 4.3 mmol/L (ref 3.5–5.1)
Sodium: 138 mmol/L (ref 135–145)

## 2023-05-08 MED ORDER — APIXABAN 5 MG PO TABS
10.0000 mg | ORAL_TABLET | Freq: Two times a day (BID) | ORAL | Status: DC
Start: 2023-05-08 — End: 2023-05-08
  Administered 2023-05-08: 10 mg via ORAL
  Filled 2023-05-08: qty 2

## 2023-05-08 MED ORDER — CARVEDILOL 3.125 MG PO TABS
3.1250 mg | ORAL_TABLET | Freq: Two times a day (BID) | ORAL | Status: DC
  Administered 2023-05-08 – 2023-05-11 (×6): 3.125 mg via ORAL
  Filled 2023-05-08 (×6): qty 1

## 2023-05-08 MED ORDER — DIVALPROEX SODIUM 125 MG PO CSDR
250.0000 mg | DELAYED_RELEASE_CAPSULE | Freq: Two times a day (BID) | ORAL | Status: DC
  Administered 2023-05-09 – 2023-05-11 (×6): 250 mg via ORAL
  Filled 2023-05-08 (×6): qty 2

## 2023-05-08 MED ORDER — ACETAMINOPHEN 500 MG PO TABS
1000.0000 mg | ORAL_TABLET | Freq: Three times a day (TID) | ORAL | Status: DC | PRN
  Administered 2023-05-09 (×2): 1000 mg via ORAL
  Filled 2023-05-08 (×2): qty 2

## 2023-05-08 MED ORDER — ACETAMINOPHEN 650 MG RE SUPP: 650.0000 mg | Freq: Three times a day (TID) | RECTAL | Status: DC | PRN

## 2023-05-08 MED ORDER — APIXABAN 5 MG PO TABS: 5.0000 mg | ORAL_TABLET | Freq: Two times a day (BID) | ORAL | Status: DC

## 2023-05-08 MED ORDER — HYDROCODONE-ACETAMINOPHEN 5-325 MG PO TABS
1.0000 | ORAL_TABLET | Freq: Once | ORAL | Status: AC
  Administered 2023-05-08: 1 via ORAL
  Filled 2023-05-08: qty 1

## 2023-05-08 MED ORDER — APIXABAN 5 MG PO TABS
10.0000 mg | ORAL_TABLET | Freq: Two times a day (BID) | ORAL | Status: DC
  Administered 2023-05-08 – 2023-05-11 (×6): 10 mg via ORAL
  Filled 2023-05-08 (×6): qty 2

## 2023-05-08 MED ORDER — DOCUSATE SODIUM 100 MG PO CAPS
100.0000 mg | ORAL_CAPSULE | Freq: Two times a day (BID) | ORAL | Status: DC | PRN
  Administered 2023-05-11: 100 mg via ORAL
  Filled 2023-05-08: qty 1

## 2023-05-08 NOTE — Plan of Care (Signed)
  Problem: Coping: Goal: Ability to adjust to condition or change in health will improve Outcome: Progressing   Problem: Health Behavior/Discharge Planning: Goal: Ability to identify and utilize available resources and services will improve Outcome: Progressing Goal: Ability to manage health-related needs will improve Outcome: Progressing   Problem: Nutritional: Goal: Maintenance of adequate nutrition will improve Outcome: Progressing   Problem: Education: Goal: Knowledge of General Education information will improve Description: Including pain rating scale, medication(s)/side effects and non-pharmacologic comfort measures Outcome: Progressing   Problem: Clinical Measurements: Goal: Respiratory complications will improve Outcome: Progressing   Problem: Coping: Goal: Level of anxiety will decrease Outcome: Progressing

## 2023-05-08 NOTE — Progress Notes (Addendum)
 NAME:  Lori Orozco, MRN:  [id number], DOB:  06-02-1950, LOS: 4 ADMISSION DATE:  05/04/2023, CONSULTATION DATE:  05/05/23 REFERRING MD:  opyd, CHIEF COMPLAINT:  Respiratory Failure   History of Present Illness:  Lori Orozco is a 73 y.o. F with PMH significant for COPD, former tobacco use, lung nodules (has seen Pulmonology, Dr. Maple Hudson), CKD 3a, DM, bipolar disorder, fibromyalgia who presented to the ED with three days of flu-like symptoms and confusion with loss of appetite.   She was placed on Bipap on arrival to the ED, work up was negative for Flu and Covid, no leukocytosis or lactic acidosis.  Despite Bipap she had worsening hypercarbic respiratory failure and mental status and required intubation   Pertinent  Medical History  has a past medical history of Allergy (Hayfever), Amaurosis fugax, Anemia, Anxiety, Arthritis, Asthma, Cataract, Chronic fatigue syndrome, Chronic kidney disease, COPD (chronic obstructive pulmonary disease) (HCC), COVID-19, Depression, Diverticulitis, Emphysema of lung (HCC), Fibromyalgia, GERD (gastroesophageal reflux disease), H/O hiatal hernia, History of bronchitis, Hyperlipidemia, Hypothyroidism, Interstitial cystitis, Irritable bowel syndrome, Lichen sclerosus, Lumbar herniated disc, Migraines, Motor nerve conduction block (11/2021), Osteoporosis (09/10/2022), Pneumonia, PONV (postoperative nausea and vomiting), Shortness of breath, Thyroid disease, Urinary frequency, Urinary tract infection, and Vertigo.   Significant Hospital Events: Including procedures, antibiotic start and stop dates in addition to other pertinent events   3/9 presented with hypercarbic respiratory failure, admit to [facility] then worsening hypercarbia and required intubation  3/9 Bilateral PE :  Small segmental and subsegmental bilateral lower lobe pulmonary emboli. No right heart strain. On heparin ggt 3/10: LV EF 60-65%. No RWM abnormalities. G1DD. Mild concentric hypertrophy. No shunts  detected by doppler. 3/10 Lower extremity doppler ultrasound - negative for DVT 3/10 extubated  Interim History / Subjective:  Mild agitation overnight with flight of ideas. Better this AM at bedside. Hypertensive this AM. Feeling comfortable. Would like to take bath. Continues on Ceftriaxone 1g and Azithromycin   Objective   Blood pressure (!) 153/67, pulse 81, temperature 98.2 F (36.8 C), temperature source Oral, resp. rate 18, height 5\' 7"  (1.702 m), weight 98.4 kg, SpO2 93%.        Intake/Output Summary (Last 24 hours) at 05/08/2023 1029 Last data filed at 05/08/2023 0400 Gross per 24 hour  Intake 360.4 ml  Output 475 ml  Net -114.6 ml   Filed Weights   05/06/23 0500 05/07/23 0419 05/08/23 0500  Weight: 98.4 kg 99.9 kg 98.4 kg    Examination: General: Chronically ill appearing woman in NAD HENT: Intubated. OG tube in place Lungs: Speaking in full sentences; mild expiratory wheezing Cardiovascular: Regular rate Abdomen: normal bowel sounds Extremities: No pitting edema Neuro: alert and oriented; good thought content. GU: Foley in place  Pertinent labs CBC normal, stable BMP Cr . 1.22 < 1.02 GFR 47 <58  3/10: LV EF 60-65%. No RWM abnormalities. G1DD. Mild concentric hypertrophy. No shunts detected by doppler. 3/10 Lower extremity doppler ultrasound - negative for DVT Resolved Hospital Problem list     Assessment & Plan:  Acute hypoxic respiratory failure Bilateral segmental and subsegmental PE Bilateral lowel lobe consolidations  BL upper lobe consolidaiton COPD with emphysema, not on home supp O2 Mobility impaired woman with chronic inflammation due to severe RA now acute PNA. Improving. Now on 2-4L [place] - No heart strain on CT or EKG, and TTE - Heparin drip Eliquis 10 mg BID for 7 days Eliquis 5 mg BID for 3-6 months AC - Prednisone 40 mg  - Brovana,  Mikael Spray and Pulmicort nebs - Ceftriaxone (3/9 - - Azithromycin 500 mg per tube (3/10 -3/12) - Follow up  Urine strep pneumo and legionella Ag  -respiratory panel and MRSA nares neg - blood cultures , NGTD - Patient is stable for transfer out of ICU - [facility] will assume care on 3/13  Baseline CKD 3a Baseline Cr: 1-1.14 per record Mildly increased today -trend renal indices -continue Mybetriq   Agitation ICU delirium risk Depression and anxiety Bipolar Disorder - Depakote and zoloft -Restarting Buspar, Wellbutrin, Remeron  Type 2 DM -SSI -hold Zepbound   Rheumatoid Arthritis -hold home Prednisone 10mg  every day -on Enbrel injections PTA; holding until discharged -holding home Norco   HTN - Hypertensive despite home Metoprolol XL 12.5mg  - Will transition to Coreg today; titrate as needed  HLD -continue statin   Best Practice (right click and "Reselect all SmartList Selections" daily)   Diet/type: Regular consistency (see orders) DVT prophylaxis: systemic heparin GI prophylaxis: H2B Lines: N/A Foley:  N/A Code Status:  full code Last date of multidisciplinary goals of care discussion [--]  Labs   CBC: Recent Labs  Lab 05/04/23 2137 05/04/23 2146 05/05/23 0427 05/05/23 0623 05/06/23 0020 05/07/23 0547 05/08/23 0311  WBC 7.2  --  6.9  --  6.8 6.3 10.1  NEUTROABS 2.6  --   --   --   --   --   --   HGB 12.7   < > 12.6 13.9 13.2 12.4 12.8  HCT 42.3   < > 42.4 41.0 42.4 38.6 42.0  MCV 104.4*  --  106.3*  --  99.8 96.5 101.9*  PLT 190  --  172  --  162 179 212   < > = values in this interval not displayed.    Basic Metabolic Panel: Recent Labs  Lab 05/04/23 2137 05/04/23 2146 05/05/23 0427 05/05/23 0623 05/06/23 0020 05/06/23 1049 05/06/23 2042 05/07/23 0547 05/07/23 1709 05/08/23 0311  NA 143   < > 139 138 138  --   --  138  --  138  K 4.2   < > 4.4 4.4 3.5  --   --  3.5  --  4.3  CL 96*  --  99  --  102  --   --  107  --  105  CO2 32  --  31  --  23  --   --  21*  --  25  GLUCOSE 125*  --  154*  --  148*  --   --  136*  --  73  BUN 24*   --  21  --  26*  --   --  48*  --  56*  CREATININE 1.17*  --  1.02*  --  1.22*  --   --  1.10*  --  1.27*  CALCIUM 9.1  --  8.3*  --  8.7*  --   --  8.2*  --  8.8*  MG  --   --   --   --   --  1.7 1.8 3.1* 3.1*  --   PHOS  --   --   --   --   --  3.4 2.7 3.1 6.1*  --    < > = values in this interval not displayed.   GFR: Estimated Creatinine Clearance: 48.2 mL/min (A) (by C-G formula based on SCr of 1.27 mg/dL (H)). Recent Labs  Lab 05/04/23 2137 05/05/23 0427 05/06/23 0020 05/07/23 0547 05/08/23  0311  WBC 7.2 6.9 6.8 6.3 10.1  LATICACIDVEN 1.3  --   --   --   --     Liver Function Tests: Recent Labs  Lab 05/04/23 2137  AST 37  ALT 28  ALKPHOS 40  BILITOT 0.9  PROT 6.5  ALBUMIN 3.2*   No results for input(s): "LIPASE", "AMYLASE" in the last 168 hours. Recent Labs  Lab 05/04/23 2138  AMMONIA 35    ABG    Component Value Date/Time   PHART 7.416 05/05/2023 0623   PCO2ART 54.1 (H) 05/05/2023 0623   PO2ART 63 (L) 05/05/2023 0623   HCO3 34.7 (H) 05/05/2023 0623   TCO2 36 (H) 05/05/2023 0623   O2SAT 92 05/05/2023 0623     Coagulation Profile: No results for input(s): "INR", "PROTIME" in the last 168 hours.  Cardiac Enzymes: No results for input(s): "CKTOTAL", "CKMB", "CKMBINDEX", "TROPONINI" in the last 168 hours.  HbA1C: Hgb A1c MFr Bld  Date/Time Value Ref Range Status  05/05/2023 04:27 AM 5.6 4.8 - 5.6 % Final    Comment:    (NOTE) Pre diabetes:          5.7%-6.4%  Diabetes:              >6.4%  Glycemic control for   <7.0% adults with diabetes   07/13/2022 12:23 PM 5.2 4.6 - 6.5 % Final    Comment:    Glycemic Control Guidelines for People with Diabetes:Non Diabetic:  <6%Goal of Therapy: <7%Additional Action Suggested:  >8%     CBG: Recent Labs  Lab 05/07/23 1539 05/07/23 1947 05/07/23 2330 05/08/23 0348 05/08/23 0750  GLUCAP 105* 96 96 76 73    Review of Systems:     Past Medical History:  She,  has a past medical history of  Allergy (Hayfever), Amaurosis fugax, Anemia, Anxiety, Arthritis, Asthma, Cataract, Chronic fatigue syndrome, Chronic kidney disease, COPD (chronic obstructive pulmonary disease) (HCC), COVID-19, Depression, Diverticulitis, Emphysema of lung (HCC), Fibromyalgia, GERD (gastroesophageal reflux disease), H/O hiatal hernia, History of bronchitis, Hyperlipidemia, Hypothyroidism, Interstitial cystitis, Irritable bowel syndrome, Lichen sclerosus, Lumbar herniated disc, Migraines, Motor nerve conduction block (11/2021), Osteoporosis (09/10/2022), Pneumonia, PONV (postoperative nausea and vomiting), Shortness of breath, Thyroid disease, Urinary frequency, Urinary tract infection, and Vertigo.   Surgical History:   Past Surgical History:  Procedure Laterality Date   ABDOMINAL HYSTERECTOMY     bladder sugery      CAROTID DOPPLAR     COLONOSCOPY  05/26/2010   [facility]ms- otherwise nl , re check 10y   DEXA-OSTEOPENIA     DOPPLER ECHOCARDIOGRAPHY     elbow surgery     EPICONDYLITIS     EYE SURGERY Bilateral    cataracts   FOOT SURGERY Bilateral    I & D EXTREMITY Right 09/01/2020   Procedure: TYNOSYNOVECTOMY;  Surgeon: [name], MD;  Location: [facility] OR;  Service: Orthopedics;  Laterality: Right;   IMPLANTATION VAGAL NERVE STIMULATOR  03/2022   JOINT REPLACEMENT     KNEE SURGERY     Left cartilage   lipoma in second finger right hand     MOUTH SURGERY  08/22/2022   PLANTAR FASCIA SURGERY Left    REVERSE SHOULDER ARTHROPLASTY Left 02/02/2020   Procedure: LEFT REVERSE SHOULDER ARTHROPLASTY;  Surgeon: [name], MD;  Location: [facility] OR;  Service: Orthopedics;  Laterality: Left;   ROTATOR CUFF REPAIR Left 12/2017   TEMPOROMANDIBULAR JOINT SURGERY     TONSILLECTOMY     TOTAL HIP ARTHROPLASTY Right  03/20/2013   Procedure: Right TOTAL HIP ARTHROPLASTY;  Surgeon: [name], MD;  Location: [facility] OR;  Service: Orthopedics;  Laterality: Right;  Right Total Hip Arthroplasty   TOTAL KNEE  ARTHROPLASTY Left 01/28/2015   Procedure: LEFT TOTAL KNEE ARTHROPLASTY;  Surgeon: [name], MD;  Location: [facility] ORS;  Service: Orthopedics;  Laterality: Left;   TRIGGER FINGER RELEASE Right    TROCHANTERIC BURSA EXCISION Right 05/03/2016   Dr. Doneen Poisson   WRIST ARTHROSCOPY Right    ligament tear     Social History:   reports that she quit smoking about 10 years ago. Her smoking use included cigarettes. She started smoking about 53 years ago. She has a 86 pack-year smoking history. She has never been exposed to tobacco smoke. She has never used smokeless tobacco. She reports that she does not drink alcohol and does not use drugs.   Family History:  Her family history includes Arthritis in her maternal grandmother, mother, and sister; Cancer in her father; Cancer - Lung in her cousin; Colon cancer in an other family member; Hearing loss in her maternal grandfather; Heart disease in her mother and sister; Hypertension in her mother; Kidney disease in her mother; Stroke in her mother; Varicose Veins in her maternal uncle.   Allergies Allergies  Allergen Reactions   Lithium Anaphylaxis   Tegretol [Carbamazepine] Other (See Comments)    Fever and body aches (fever over 103)   Tricyclic Antidepressants Anaphylaxis    Other reaction(s): Unknown   Methotrexate Derivatives Other (See Comments)    Urinary retention and dizziness  Other reaction(s): Other UTI   Strawberry Extract Hives   Tapentadol Rash    PT ALLERGIC NYLON TAPE    Codeine Nausea Only    Makes pt stay awake   Cymbalta [Duloxetine Hcl] Other (See Comments)    Makes pt pass out    Erythromycin     abdominal pain   Lyrica [Pregabalin]     Felt faint   Neurontin [Gabapentin]     Passes  out   Rabeprazole Sodium     insomnia   Baclofen Nausea Only    Sever nausea   Duraprep [Antiseptic Products, Misc.] Itching and Rash    Tolerates Betadine    Penicillins Rash    Tolerated ANCEF 02/02/20  Has  patient had a PCN reaction causing immediate rash, facial/tongue/throat swelling, SOB or lightheadedness with hypotension: Yes Has patient had a PCN reaction causing severe rash involving mucus membranes or skin necrosis: No Has patient had a PCN reaction that required hospitalization No Has patient had a PCN reaction occurring within the last 10 years: No If all of the above answers are "NO", then may proceed with Cephalosporin use.    Tape Rash    PT ALLERGIC NYLON TAPE      Home Medications  Prior to Admission medications   Medication Sig Start Date End Date Taking? Authorizing Provider  acetaminophen (TYLENOL) 500 MG tablet Take 500 mg by mouth every 6 (six) hours as needed.   Yes [provider]  alendronate (FOSAMAX) 70 MG tablet TAKE 1 TABLET BY MOUTH ONCE A WEEK. TAKE WITH A FULL GLASS OF WATER ON AN EMPTY STOMACH. 02/11/23  Yes Rice, Jamesetta Orleans, MD  Black Cohosh 40 MG CAPS Take 1 capsule (40 mg total) by mouth daily. 05/25/21  Yes Constant, Peggy, MD  Budeson-Glycopyrrol-Formoterol (BREZTRI AEROSPHERE) 160-9-4.8 MCG/ACT AERO Inhale 2 puffs into the lungs 2 (two) times daily. 02/10/23  Yes Tower, [place]  A, MD  buPROPion (WELLBUTRIN XL) 150 MG 24 hr tablet Take 150 mg by mouth daily.   Yes [provider]  busPIRone (BUSPAR) 15 MG tablet Take 15 mg by mouth 2 (two) times daily before a meal.   Yes [provider]  cholecalciferol (VITAMIN D) 25 MCG (1000 UNIT) tablet Take 2,000 Units by mouth daily. 2 tablets daily   Yes [provider]  clobetasol cream (TEMOVATE) 0.05 % APPLY SMALL AMOUNT TO AFFECTED AREAS TWO TO THREE TIMES PER WEEK 02/08/23  Yes Tower, Audrie Gallus, MD  cyanocobalamin 2000 MCG tablet Take 500 mcg by mouth daily.   Yes [provider]  diclofenac Sodium (VOLTAREN) 1 % GEL Apply 2 g topically 2 (two) times daily as needed (pain). 09/07/20  Yes Lynn Ito, MD  divalproex (DEPAKOTE ER) 500 MG 24 hr tablet Take 500 mg by mouth at  bedtime.  11/02/19  Yes [provider]  etanercept (ENBREL SURECLICK) 50 MG/ML injection Inject 50 mg into the skin once a week. 04/22/23  Yes Rice, Jamesetta Orleans, MD  fluticasone (FLONASE) 50 MCG/ACT nasal spray PLACE 2 SPRAYS INTO THE NOSE DAILY. 12/19/21  Yes Young, Joni Fears D, MD  HYDROcodone-acetaminophen (NORCO/VICODIN) 5-325 MG tablet Take 1 tablet by mouth 3 (three) times daily as needed for moderate pain. 11/20/22  Yes [name], MD  hydrocortisone cream 1 % Apply 1 Application topically 2 (two) times daily as needed for itching.   Yes [provider]  ibuprofen (ADVIL) 200 MG tablet Take 200 mg by mouth every 6 (six) hours as needed.   Yes [provider]  ipratropium (ATROVENT) 0.06 % nasal spray Place 2 sprays into both nostrils 4 (four) times daily. Patient states she uses 2 times per day.   Yes [provider]  ketotifen (ZADITOR) 0.025 % ophthalmic solution Place 1 drop into both eyes 2 (two) times daily as needed (allergies).   Yes [provider]  levothyroxine (SYNTHROID) 50 MCG tablet TAKE 1 TABLET BY MOUTH DAILY BEFORE BREAKFAST 04/16/23  Yes Tower, Marne A, MD  LORazepam (ATIVAN) 1 MG tablet Take 1 mg by mouth at bedtime as needed for anxiety or sleep. 06/28/20  Yes [provider]  metoprolol succinate (TOPROL-XL) 25 MG 24 hr tablet Take 0.5 tablets (12.5 mg total) by mouth daily. 02/12/23  Yes Tower, Audrie Gallus, MD  mirabegron ER (MYRBETRIQ) 25 MG TB24 tablet Take 1 tablet (25 mg total) by mouth in the morning and at bedtime. 04/29/22  Yes Tower, Audrie Gallus, MD  mirtazapine (REMERON) 30 MG tablet Take 30 mg by mouth at bedtime.  07/22/15  Yes [provider]  ondansetron (ZOFRAN-ODT) 4 MG disintegrating tablet Take 1 tablet (4 mg total) by mouth every 8 (eight) hours as needed for nausea or vomiting. 02/10/21  Yes Tower, Audrie Gallus, MD  predniSONE (DELTASONE) 10 MG tablet TAKE 1 TABLET (10 MG TOTAL) BY MOUTH DAILY WITH  BREAKFAST. 04/23/23  Yes Rice, Jamesetta Orleans, MD  Rhubarb (ESTROVEN COMPLETE) 4 MG TABS Take 4 mg by mouth daily. 05/25/21  Yes Constant, Peggy, MD  sertraline (ZOLOFT) 100 MG tablet Take 100 mg by mouth daily.  12/06/14  Yes [provider]  simvastatin (ZOCOR) 20 MG tablet TAKE 1 TABLET BY MOUTH AT BEDTIME 04/29/23  Yes Tower, Audrie Gallus, MD  Spacer/Aero-Holding Chambers (AEROCHAMBER PLUS) inhaler Use with inhaler 06/29/21  Yes Domenick Gong, MD  traMADol (ULTRAM) 50 MG tablet Take 50 mg by mouth every 6 (six) hours  as needed for moderate pain (pain score 4-6).   Yes [provider]  diphenhydrAMINE (BENADRYL) 25 MG tablet Take 25 mg by mouth every 6 (six) hours as needed for allergies. Patient not taking: Reported on 05/05/2023    [provider]  ipratropium-albuterol (DUONEB) 0.5-2.5 (3) MG/3ML SOLN Take 3 mLs by nebulization every 6 (six) hours as needed. Patient not taking: Reported on 05/05/2023 12/29/19   Jetty Duhamel D, MD  tirzepatide [facility]) 2.5 MG/0.5ML injection vial Inject 2.5 mg into the skin once a week. Patient not taking: Reported on 05/05/2023 03/24/23   [name], MD     Critical care time:       Morene Crocker, MD [facility] Internal Medicine Program - PGY-2 05/08/2023, 10:29 AM Pager# [phone]

## 2023-05-08 NOTE — Progress Notes (Signed)
 PHARMACY - ANTICOAGULATION CONSULT NOTE  Pharmacy Consult for heparin Indication: pulmonary embolus  Allergies  Allergen Reactions   Lithium Anaphylaxis   Tegretol [Carbamazepine] Other (See Comments)    Fever and body aches (fever over 103)   Tricyclic Antidepressants Anaphylaxis    Other reaction(s): Unknown   Methotrexate Derivatives Other (See Comments)    Urinary retention and dizziness  Other reaction(s): Other UTI   Strawberry Extract Hives   Tapentadol Rash    PT ALLERGIC NYLON TAPE    Codeine Nausea Only    Makes pt stay awake   Cymbalta [Duloxetine Hcl] Other (See Comments)    Makes pt pass out    Erythromycin     abdominal pain   Lyrica [Pregabalin]     Felt faint   Neurontin [Gabapentin]     Passes  out   Rabeprazole Sodium     insomnia   Baclofen Nausea Only    Sever nausea   Duraprep [Antiseptic Products, Misc.] Itching and Rash    Tolerates Betadine    Penicillins Rash    Tolerated ANCEF 02/02/20  Has patient had a PCN reaction causing immediate rash, facial/tongue/throat swelling, SOB or lightheadedness with hypotension: Yes Has patient had a PCN reaction causing severe rash involving mucus membranes or skin necrosis: No Has patient had a PCN reaction that required hospitalization No Has patient had a PCN reaction occurring within the last 10 years: No If all of the above answers are "NO", then may proceed with Cephalosporin use.    Tape Rash    PT ALLERGIC NYLON TAPE     Patient Measurements: Height: 5\' 7"  (170.2 cm) Weight: 98.4 kg (216 lb 14.9 oz) IBW/kg (Calculated) : 61.6 Heparin Dosing Weight: 84.4 kg  Vital Signs: Temp: 98.7 F (37.1 C) (03/12 1134) Temp Source: Axillary (03/12 1134) BP: 150/82 (03/12 1200) Pulse Rate: 82 (03/12 1200)  Labs: Recent Labs    05/06/23 0020 05/06/23 0427 05/06/23 1416 05/07/23 0114 05/07/23 0547 05/07/23 1006 05/08/23 0311  HGB 13.2  --   --   --  12.4  --  12.8  HCT 42.4  --   --   --  38.6   --  42.0  PLT 162  --   --   --  179  --  212  APTT  --  121*  --   --   --   --   --   HEPARINUNFRC  --  >1.10*   < > 0.71*  --  0.68 0.60  CREATININE 1.22*  --   --   --  1.10*  --  1.27*   < > = values in this interval not displayed.    Estimated Creatinine Clearance: 48.2 mL/min (A) (by C-G formula based on SCr of 1.27 mg/dL (H)).  Assessment: 63 yoF with bilateral pulmonary emboli without evidence of heart strain. No oral anticoagulation reported prior to admission. History of PE in 11/2020 in setting of air travel and estrogen but was not discharged on anticoag. 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 AVM was not treated and recommended to be rechecked in 10 years. Patient had a second trial of Eliquis in 12/22 but required transfusion on that admission as well. 3/10 doppler ultrasound negative for DVT.  Pharmacy consulted to manage heparin infusion. Patient passed SBT/SAT yesterday and was extubated.   Heparin therapeutic with HL 0.60 at infusion rate 950 units/hr. Note of abrasions  but no other findings of bleeding. CBC remains stable.   Discussed with team transitioning to oral PE treatment with apixaban as patient passed a swallow eval, clinically stable, no drug interactions, adequate liver function.  Goal of Therapy:  Heparin level 0.3-0.7 units/ml Monitor platelets by anticoagulation protocol: Yes   Plan:  Discontinue heparin infusion 950 units/hr Start apixaban 10 mg PO BID x7 days, then 5 mg PO BID -- F/u duration outpatient Monitor daily H&H and platelets, signs of bleeding   Laqueta Jean PharmD Candidate 05/08/2023 1:27 PM

## 2023-05-08 NOTE — Progress Notes (Signed)
 Pt's level of anxiety has not decreased despite tx with p.o. Ativan.  Continues to have flight of ideas and has trouble making coherent conversation.  Unable to follow pt's train of thought in conversations at times.  Pt c/o nausea.  Treated with IV Zofran.  Will reassess.  Other than high level of anxiety and inability to sleep, pt in NAD.  VSS.  Will continue to monitor closely.

## 2023-05-08 NOTE — Evaluation (Signed)
 Occupational Therapy Evaluation Patient Details Name: Lori Orozco MRN: [id number] DOB: 1951/01/22 Today's Date: 05/08/2023   History of Present Illness   Ms. Hoots is a 73 y.o. F admitted with flu symptoms and confusion. Found to have PNA, bil PE and VDRF 3/9-3/11.  PMH significant for COPD, former tobacco use, lung nodules (has seen Pulmonology, Dr. Maple Hudson), CKD 3a, DM, bipolar disorder, fibromyalgia, RA.     Clinical Impressions Pt walks short distances at home with a RW. Lives with her sister who assist with sponge bathing (using wipes) and IADLs. Pt reports being able to dress herself. 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 She has a history of falls. Pt presents with generalized weakness, decreased activity tolerance, impaired standing balance and cognitive deficits. She typically has a mild tremor per her sister, it is more moderate at this time. Pt requires min assist +2 safety for EOB and OOB to chair and set up to total assist for ADLs. Patient will benefit from continued inpatient follow up therapy, <3 hours/day.      If plan is discharge home, recommend the following:   A little help with walking and/or transfers;A lot of help with bathing/dressing/bathroom;Assistance with cooking/housework;Assistance with feeding;Direct supervision/assist for medications management;Direct supervision/assist for financial management;Assist for transportation;Help with stairs or ramp for entrance     Functional Status Assessment   Patient has had a recent decline in their functional status and demonstrates the ability to make significant improvements in function in a reasonable and predictable amount of time.     Equipment Recommendations   Other (comment) (defer)     Recommendations for Other Services         Precautions/Restrictions   Precautions Precautions: Fall Recall of Precautions/Restrictions: Impaired Restrictions Weight  Bearing Restrictions Per Provider Order: No     Mobility Bed Mobility Overal bed mobility: Needs Assistance Bed Mobility: Supine to Sit     Supine to sit: Min assist     General bed mobility comments: needed a little assist to get to EOB    Transfers Overall transfer level: Needs assistance Equipment used: Rolling walker (2 wheels) Transfers: Sit to/from Stand, Bed to chair/wheelchair/BSC Sit to Stand: Min assist, +2 safety/equipment, From elevated surface Stand pivot transfers: Min assist, +2 safety/equipment, From elevated surface         General transfer comment: Pt needed min assist to stand with cues for hand placement. Pt shaky at times and reports that she has been having a work up for [employer].      Balance Overall balance assessment: Needs assistance                                         ADL either performed or assessed with clinical judgement   ADL Overall ADL's : Needs assistance/impaired Eating/Feeding: Moderate assistance;With adaptive utensils;Sitting Eating/Feeding Details (indicate cue type and reason): provided build ups for utensils Grooming: Minimal assistance;Wash/dry hands;Wash/dry face;Sitting   Upper Body Bathing: Total assistance;Sitting   Lower Body Bathing: Total assistance;Sit to/from stand   Upper Body Dressing : Minimal assistance;Sitting   Lower Body Dressing: Minimal assistance;Sit to/from stand Lower Body Dressing Details (indicate cue type and reason): can perform figure 4 with difficulty Toilet Transfer: +2 for safety/equipment;Minimal assistance;Stand-pivot;Rolling walker (2 wheels);BSC/3in1   Toileting- Clothing Manipulation and Hygiene: Total assistance;Sit to/from stand  Vision Ability to See in Adequate Light: 0 Adequate Patient Visual Report: No change from baseline       Perception         Praxis         Pertinent Vitals/Pain Pain Assessment Pain Assessment: Faces Faces  Pain Scale: Hurts even more Pain Location: back of head Pain Descriptors / Indicators: Aching, Discomfort, Grimacing, Guarding Pain Intervention(s): Monitored during session, Repositioned     Extremity/Trunk Assessment Upper Extremity Assessment Upper Extremity Assessment: Right hand dominant;RUE deficits/detail;LUE deficits/detail RUE Deficits / Details: RA changes in hand, reports she is to have surgery on it, tremor RUE Coordination: decreased fine motor LUE Deficits / Details: tremor   Lower Extremity Assessment Lower Extremity Assessment: Generalized weakness   Cervical / Trunk Assessment Cervical / Trunk Assessment: Kyphotic   Communication Communication Communication: No apparent difficulties (verbose)   Cognition Arousal: Alert Behavior During Therapy: Flat affect, Impulsive, Anxious Cognition: Cognition impaired   Orientation impairments: Situation Awareness: Intellectual awareness impaired, Online awareness impaired   Attention impairment (select first level of impairment): Focused attention Executive functioning impairment (select all impairments): Problem solving, Reasoning                   Following commands: Impaired Following commands impaired: Follows one step commands inconsistently, Follows one step commands with increased time     Cueing  General Comments   Cueing Techniques: Verbal cues;Tactile cues;Gestural cues;Visual cues  desats to 85% on RA, replaced 2L and instructed in pursed lip breathing with rebound to 95%   Exercises     Shoulder Instructions      Home Living Family/patient expects to be discharged to:: Private residence Living Arrangements: Other relatives (sister) Available Help at Discharge: Family;Available PRN/intermittently Type of Home: House Home Access: Stairs to enter [employer] of Steps: 1 Entrance Stairs-Rails: Right Home Layout: One level     Bathroom Shower/Tub: Walk-in shower;Sponge bathes at  baseline   [employer]: Standard     Home Equipment: Rollator (4 wheels);Rolling Walker (2 wheels);Toilet riser;Grab bars - toilet;Other (comment) (stand up walker, reports she has been fitted for a motorized w/c)   Additional Comments: Sister present end of session and reports that she isnt in the home 24 hours day and that she just had ablation surgery      Prior Functioning/Environment Prior Level of Function : Needs assist;History of Falls (last six months)             Mobility Comments: Assist at times for ambulation, fell out of bed per sister, used rollator in home and stand up walker outside of home ADLs Comments: Sister assist with sponge bathing and pt can assist with dressing, sister cleans and cooks, reports she dresses herself and can trim her toenails, but it takes several days    OT Problem List: Decreased strength;Decreased activity tolerance;Impaired balance (sitting and/or standing);Decreased range of motion;Pain;Impaired UE functional use;Obesity;Decreased cognition;Decreased safety awareness;Decreased knowledge of use of DME or AE   OT Treatment/Interventions: Self-care/ADL training;DME and/or AE instruction;Energy conservation;Therapeutic activities;Cognitive remediation/compensation;Patient/family education;Balance training      OT Goals(Current goals can be found in the care plan section)   Acute Rehab OT Goals OT Goal Formulation: With patient Time For Goal Achievement: 05/22/23 Potential to Achieve Goals: Good ADL Goals Pt Will Perform Grooming: with contact guard assist;standing Pt Will Perform Lower Body Dressing: sit to/from stand;with min assist Pt Will Transfer to Toilet: with contact guard assist;ambulating;bedside commode Pt Will Perform Toileting - Clothing Manipulation and  hygiene: with min assist;sit to/from stand Additional ADL Goal #1: Pt will monitor symptoms of fatigue and shortness of breath and initiate rest break. Additional ADL  Goal #2: Pt will complete bed mobility mod I in preparation for ADLs.   OT Frequency:  Min 2X/week    Co-evaluation PT/OT/SLP Co-Evaluation/Treatment: Yes Reason for Co-Treatment: Complexity of the patient's impairments (multi-system involvement);For patient/therapist safety PT goals addressed during session: Mobility/safety with mobility OT goals addressed during session: ADL's and self-care      AM-PAC OT "6 Clicks" Daily Activity     Outcome Measure Help from another person eating meals?: A Little Help from another person taking care of personal grooming?: A Little Help from another person toileting, which includes using toliet, bedpan, or urinal?: Total Help from another person bathing (including washing, rinsing, drying)?: Total Help from another person to put on and taking off regular upper body clothing?: A Little Help from another person to put on and taking off regular lower body clothing?: A Lot 6 Click Score: 13   End of Session Equipment Utilized During Treatment: Gait belt;Oxygen;Rolling walker (2 wheels) Nurse Communication: Mobility status  Activity Tolerance: Patient tolerated treatment well Patient left: in chair;with call bell/phone within reach;with chair alarm set;with family/visitor present;with nursing/sitter in room  OT Visit Diagnosis: Unsteadiness on feet (R26.81);Other abnormalities of gait and mobility (R26.89);Muscle weakness (generalized) (M62.81);History of falling (Z91.81);Other symptoms and signs involving cognitive function                Time: 1129-1205 OT Time Calculation (min): 36 min Charges:  OT General Charges $OT Visit: 1 Visit OT Evaluation $OT Eval Moderate Complexity: 1 Mod  Berna Spare, OTR/L Acute Rehabilitation Services Office: [phone]   Evern Bio 05/08/2023, 2:54 PM

## 2023-05-08 NOTE — Progress Notes (Signed)
   05/08/23 2006  Vitals  Temp 98.9 F (37.2 C)  Temp Source Oral  BP (!) 176/65  MAP (mmHg) 93  BP Location Left Arm  BP Method Automatic  Patient Position (if appropriate) Lying  Pulse Rate 79  Pulse Rate Source Monitor  Resp 17  Level of Consciousness  Level of Consciousness Alert  MEWS COLOR  MEWS Score Color Green  Oxygen Therapy  SpO2 98 %  O2 Device Nasal Cannula  O2 Flow Rate (L/min) 3 L/min  Pain Assessment  Pain Scale 0-10  Pain Score 0  MEWS Score  MEWS Temp 0  MEWS Systolic 0  MEWS Pulse 0  MEWS RR 0  MEWS LOC 0  MEWS Score 0    Pt is A&O x 4. Appears anxious and bilateral hand tremors noted. On 3L O2/nasal cannula, denies sob. BP elevated and repeat BP 161/63- see vitals charted.  Sinus rhythm on the cardiac monitor.  Pt has scheduled depakoke ER 500mg  tab and pt requested liquid form. Elink provider notified and new order received and will give meds as ordered. Safety maintained. Bed alarm on. Call bell in reach will continue to monitor.

## 2023-05-08 NOTE — Evaluation (Signed)
 Physical Therapy Evaluation Patient Details Name: Lori Orozco MRN: [id number] DOB: 09/16/1950 Today's Date: 05/08/2023  History of Present Illness  Lori Orozco is a 73 y.o. F admitted with flu symptoms and confusion. Found to have PNA, bil PE and VDRF 3/9-3/11.  PMH significant for COPD, former tobacco use, lung nodules (has seen Pulmonology, Dr. Maple Hudson), CKD 3a, DM, bipolar disorder, fibromyalgia  Clinical Impression  Pt admitted with above diagnosis. Pt requiring +2 min assist and mod cues during session for safety.  Pt only able to transfer to chair. Lori Orozco reports she cannot be with pt at all times on d/c and that pt has declined over last few weeks. Lori Orozco just had ablation surgery and cannot lift pt.  Given that pt requiring incr assist, will benefit from post acute rehab < 3 hours day.  Will folllow acutely. Pt currently with functional limitations due to the deficits listed below (see PT Problem List). Pt will benefit from acute skilled PT to increase their independence and safety with mobility to allow discharge.           If plan is discharge home, recommend the following: A little help with walking and/or transfers;A little help with bathing/dressing/bathroom;Assistance with cooking/housework;Assist for transportation;Help with stairs or ramp for entrance   Can travel by private vehicle   Yes    Equipment Recommendations None recommended by PT  Recommendations for Other Services       Functional Status Assessment Patient has had a recent decline in their functional status and demonstrates the ability to make significant improvements in function in a reasonable and predictable amount of time.     Precautions / Restrictions Precautions Precautions: Fall Recall of Precautions/Restrictions: Impaired Restrictions Weight Bearing Restrictions Per Provider Order: No      Mobility  Bed Mobility Overal bed mobility: Needs Assistance Bed Mobility: Supine to Sit      Supine to sit: Min assist     General bed mobility comments: needed a little assist to get to eOB    Transfers Overall transfer level: Needs assistance Equipment used: Rolling walker (2 wheels) Transfers: Sit to/from Stand, Bed to chair/wheelchair/BSC Sit to Stand: Min assist, +2 safety/equipment, From elevated surface Stand pivot transfers: Min assist, +2 safety/equipment, From elevated surface         General transfer comment: Pt needed min assist to stand with cues for hand placement. Pt shaky at times and reports that she has been having a work up for [employer].    Ambulation/Gait Ambulation/Gait assistance: Min assist, +2 safety/equipment Gait Distance (Feet): 4 Feet Assistive device: Rolling walker (2 wheels) Gait Pattern/deviations: Step-through pattern, Decreased stride length, Trunk flexed, Wide base of support, Leaning posteriorly   Gait velocity interpretation: <1.31 ft/sec, indicative of household ambulator   General Gait Details: Pt needing min assist of 2 and RW to take a few steps to chair.  Pt with shakiness in UE and LEs with movement and needing steadying assist.  Pt needed cues for safety as well.  Stairs            Wheelchair Mobility     Tilt Bed    Modified Rankin (Stroke Patients Only)       Balance                                             Pertinent Vitals/Pain Pain Assessment Pain Assessment:  Faces Faces Pain Scale: Hurts even more Pain Location: back of head Pain Descriptors / Indicators: Aching, Discomfort, Grimacing, Guarding Pain Intervention(s): Limited activity within patient's tolerance, Monitored during session, Repositioned    Home Living Family/patient expects to be discharged to:: Private residence Living Arrangements: Other relatives (Lori Orozco) Available Help at Discharge: Family;Available PRN/intermittently Type of Home: House Home Access: Stairs to enter Entrance Stairs-Rails: Right Entrance  Stairs-Number of Steps: 1     Home Equipment: Rollator (4 wheels);Rolling Walker (2 wheels);Toilet riser;Grab bars - toilet (stand up walker, working to get motorized wheelchair) Additional Comments: Lori Orozco present end of session and reports that she isnt in the home 24 hours day and that she just had ablation surgery    Prior Function Prior Level of Function : Needs assist;History of Falls (last six months)             Mobility Comments: Assist at times for ambulation, fell out of bed per Lori Orozco, used rollator in home and stand up walker outside of home ADLs Comments: Lori Orozco assist with bathing and pt can assist with dressing, Lori Orozco cleans and cooks     Extremity/Trunk Assessment   Upper Extremity Assessment Upper Extremity Assessment: Defer to OT evaluation    Lower Extremity Assessment Lower Extremity Assessment: Generalized weakness    Cervical / Trunk Assessment Cervical / Trunk Assessment: Kyphotic  Communication   Communication Communication: No apparent difficulties    Cognition Arousal: Alert Behavior During Therapy: Flat affect, Impulsive, Anxious   PT - Cognitive impairments: Awareness, Sequencing, Problem solving, Safety/Judgement                       PT - Cognition Comments: Pt with poor safety awareness at times. Following commands: Impaired Following commands impaired: Follows one step commands inconsistently, Follows one step commands with increased time     Cueing Cueing Techniques: Verbal cues, Tactile cues, Gestural cues, Visual cues     General Comments General comments (skin integrity, edema, etc.): 80 bpm, 95% 2LO2, desat to 85% on RA therefore replaced 2LO2.  151/65    Exercises     Assessment/Plan    PT Assessment Patient needs continued PT services  PT Problem List Decreased activity tolerance;Decreased balance;Decreased mobility;Decreased knowledge of use of DME;Decreased safety awareness;Decreased knowledge of  precautions;Cardiopulmonary status limiting activity       PT Treatment Interventions DME instruction;Gait training;Functional mobility training;Therapeutic activities;Therapeutic exercise;Balance training;Patient/family education    PT Goals (Current goals can be found in the Care Plan section)  Acute Rehab PT Goals Patient Stated Goal: to go home PT Goal Formulation: With patient Time For Goal Achievement: 05/22/23 Potential to Achieve Goals: Good    Frequency Min 2X/week     Co-evaluation PT/OT/SLP Co-Evaluation/Treatment: Yes Reason for Co-Treatment: Complexity of the patient's impairments (multi-system involvement);For patient/therapist safety PT goals addressed during session: Mobility/safety with mobility         AM-PAC PT "6 Clicks" Mobility  Outcome Measure Help needed turning from your back to your side while in a flat bed without using bedrails?: A Little Help needed moving from lying on your back to sitting on the side of a flat bed without using bedrails?: A Little Help needed moving to and from a bed to a chair (including a wheelchair)?: Total Help needed standing up from a chair using your arms (e.g., wheelchair or bedside chair)?: Total Help needed to walk in hospital room?: Total Help needed climbing 3-5 steps with a railing? : Total 6 Click  Score: 10    End of Session Equipment Utilized During Treatment: Gait belt;Oxygen Activity Tolerance: Patient limited by fatigue Patient left: in chair;with call bell/phone within reach;with chair alarm set;with family/visitor present Nurse Communication: Mobility status PT Visit Diagnosis: Unsteadiness on feet (R26.81);Muscle weakness (generalized) (M62.81);Other abnormalities of gait and mobility (R26.89)    Time: 1129-1203 PT Time Calculation (min) (ACUTE ONLY): 34 min   Charges:   PT Evaluation $PT Eval Moderate Complexity: 1 Mod   PT General Charges $$ ACUTE PT VISIT: 1 Visit         Kip Cropp M,PT Acute  Rehab Services [phone]   Bevelyn Buckles 05/08/2023, 2:17 PM

## 2023-05-09 DIAGNOSIS — J9602 Acute respiratory failure with hypercapnia: Secondary | ICD-10-CM | POA: Diagnosis not present

## 2023-05-09 DIAGNOSIS — J9601 Acute respiratory failure with hypoxia: Secondary | ICD-10-CM | POA: Diagnosis not present

## 2023-05-09 LAB — GLUCOSE, CAPILLARY
Glucose-Capillary: 113 mg/dL — ABNORMAL HIGH (ref 70–99)
Glucose-Capillary: 120 mg/dL — ABNORMAL HIGH (ref 70–99)
Glucose-Capillary: 150 mg/dL — ABNORMAL HIGH (ref 70–99)
Glucose-Capillary: 173 mg/dL — ABNORMAL HIGH (ref 70–99)
Glucose-Capillary: 64 mg/dL — ABNORMAL LOW (ref 70–99)
Glucose-Capillary: 72 mg/dL (ref 70–99)
Glucose-Capillary: 82 mg/dL (ref 70–99)

## 2023-05-09 LAB — CULTURE, BLOOD (ROUTINE X 2)
Culture: NO GROWTH
Culture: NO GROWTH
Special Requests: ADEQUATE

## 2023-05-09 LAB — STREP PNEUMONIAE URINARY ANTIGEN: Strep Pneumo Urinary Antigen: NEGATIVE

## 2023-05-09 MED ORDER — HYDRALAZINE HCL 20 MG/ML IJ SOLN: 10.0000 mg | INTRAMUSCULAR | Status: DC | PRN

## 2023-05-09 MED ORDER — METOPROLOL TARTRATE 5 MG/5ML IV SOLN: 5.0000 mg | INTRAVENOUS | Status: DC | PRN

## 2023-05-09 MED ORDER — DEXTROSE IN LACTATED RINGERS 5 % IV SOLN: INTRAVENOUS | Status: AC

## 2023-05-09 MED ORDER — ADULT MULTIVITAMIN W/MINERALS CH
1.0000 | ORAL_TABLET | Freq: Every day | ORAL | Status: DC
  Administered 2023-05-09 – 2023-05-11 (×3): 1 via ORAL
  Filled 2023-05-09 (×3): qty 1

## 2023-05-09 MED ORDER — HYDRALAZINE HCL 20 MG/ML IJ SOLN
10.0000 mg | INTRAMUSCULAR | Status: DC | PRN
  Administered 2023-05-09: 10 mg via INTRAVENOUS
  Filled 2023-05-09: qty 1

## 2023-05-09 MED ORDER — THIAMINE MONONITRATE 100 MG PO TABS
100.0000 mg | ORAL_TABLET | Freq: Every day | ORAL | Status: DC
  Administered 2023-05-09 – 2023-05-11 (×3): 100 mg via ORAL
  Filled 2023-05-09 (×3): qty 1

## 2023-05-09 MED ORDER — GUAIFENESIN 100 MG/5ML PO LIQD: 5.0000 mL | ORAL | Status: DC | PRN

## 2023-05-09 MED ORDER — ENSURE ENLIVE PO LIQD
237.0000 mL | Freq: Every day | ORAL | Status: DC
  Administered 2023-05-09 – 2023-05-11 (×3): 237 mL via ORAL

## 2023-05-09 NOTE — Progress Notes (Deleted)
   05/09/23 0016  Vitals  Temp 97.6 F (36.4 C)  Temp Source Oral  BP (!) 164/68  MAP (mmHg) 94  BP Location Left Arm  BP Method Automatic  Patient Position (if appropriate) Lying  Pulse Rate 85  Pulse Rate Source Dinamap  ECG Heart Rate 86  Resp 20  Level of Consciousness  Level of Consciousness Alert  MEWS COLOR  MEWS Score Color Green  Oxygen Therapy  SpO2 96 %  O2 Device Nasal Cannula  O2 Flow Rate (L/min) 2 L/min  MEWS Score  MEWS Temp 0  MEWS Systolic 0  MEWS Pulse 0  MEWS RR 0  MEWS LOC 0  MEWS Score 0  Provider Notification  Provider Name/Title [facility] PCCM provider, (spoke with Morrie Sheldon, RN)  Date Provider Notified 05/09/23  Time Provider Notified 0022  Method of Notification Call  Notification Reason Other (Comment);Critical Result (CBG 64- pt asymptomatic. hypoglycemia protocol followed. BP elevated 164/68 and to verify prn IV labetalol.)  Provider response See new orders (prn iv hydralaizne for SBP >160)  Date of Provider Response 05/09/23  Time of Provider Response [phone]

## 2023-05-09 NOTE — Progress Notes (Addendum)
   05/09/23 0958  Spiritual Encounters  Type of Visit Initial  Care provided to: Patient  Reason for visit Advance directives  OnCall Visit No   Chaplain prayed with Patient per request, provided spiritual care and support to Patient, and provided AD education and documentation. Patient will contact Chaplain office when the AD documentation is ready to be notarized. Patient's sister is coming later today.  Chaplain Daron Offer

## 2023-05-09 NOTE — Progress Notes (Signed)
 Latest Reference Range & Units 05/09/23 00:10 05/09/23 00:35  Glucose-Capillary 70 - 99 mg/dL 64 (L) 82  (L): Data is abnormally low   Hypoglycemic episode, pt asymptomatic. Hypoglycemia protocol followed and 4oz juice given.  Repeat CBG 82- pt asymptomatic.  [facility] PCCM provider called and spoke with Morrie Sheldon, RN and updated.  No new orders. Will continue to monitor.

## 2023-05-09 NOTE — Plan of Care (Signed)

## 2023-05-09 NOTE — TOC Progression Note (Signed)
 Transition of Care [facility]) - Progression Note    Patient Details  Name: Lori Orozco MRN: [id number] Date of Birth: 1950/11/03  [facility]) CM/SW Contact  Claudina Oliphant A [place], LCSW Phone Number: 05/09/2023, 12:05 PM  Clinical Narrative:     CSW met with pt at bedside. She was agreeable to SNF at discharge. Stated she has preference for [facility] and rehab. Does not want [facility].   CSW to complete SNF workup, bed offers with medicare ratings to be provided when available.   CSW sent request to [employer] to assist pt with Medicaid application.  TOC will continue to follow.    Expected Discharge Plan: Skilled Nursing Facility Barriers to Discharge: Continued Medical Work up, SNF Pending bed offer  Expected Discharge Plan and Services                                               Social Determinants of Health (SDOH) Interventions SDOH Screenings   Food Insecurity: Patient Unable To Answer (05/05/2023)  Recent Concern: Food Insecurity - Food Insecurity Present (02/04/2023)  Housing: Patient Unable To Answer (05/05/2023)  Transportation Needs: Patient Unable To Answer (05/05/2023)  Utilities: Patient Unable To Answer (05/05/2023)  Alcohol Screen: Low Risk  (08/23/2022)  Depression (PHQ2-9): High Risk (02/08/2023)  Financial Resource Strain: Medium Risk (02/04/2023)  Physical Activity: Inactive (02/04/2023)  Social Connections: Patient Unable To Answer (05/05/2023)  Stress: Stress Concern Present (02/04/2023)  Tobacco Use: Medium Risk (05/05/2023)    Readmission Risk Interventions     No data to display

## 2023-05-09 NOTE — Progress Notes (Signed)
   05/09/23 0144  Vitals  BP (!) 161/64  MAP (mmHg) 90  BP Location Left Arm  BP Method Automatic  Patient Position (if appropriate) Lying  Pulse Rate 86  Pulse Rate Source Monitor  MEWS COLOR  MEWS Score Color Green  Oxygen Therapy  SpO2 98 %  O2 Device Nasal Cannula  O2 Flow Rate (L/min) 2 L/min  MEWS Score  MEWS Temp 0  MEWS Systolic 0  MEWS Pulse 0  MEWS RR 0  MEWS LOC 0  MEWS Score 0   PRN IV hydralazine given as ordered. Will continue to monitor.

## 2023-05-09 NOTE — Care Management Important Message (Signed)
 Important Message  Patient Details  Name: Lori Orozco MRN: [id number] Date of Birth: 1950-04-15   Important Message Given:  Yes - Medicare IM     Dorena Bodo 05/09/2023, 2:24 PM

## 2023-05-09 NOTE — Progress Notes (Signed)
   05/09/23 0016  Vitals  Temp 97.6 F (36.4 C)  Temp Source Oral  BP (!) 164/68  MAP (mmHg) 94  BP Location Left Arm  BP Method Automatic  Patient Position (if appropriate) Lying  Pulse Rate 85  Pulse Rate Source Dinamap  Resp 20  Level of Consciousness  Level of Consciousness Alert  MEWS COLOR  MEWS Score Color Green  Oxygen Therapy  SpO2 96 %  O2 Device Nasal Cannula  O2 Flow Rate (L/min) 2 L/min  MEWS Score  MEWS Temp 0  MEWS Systolic 0  MEWS Pulse 0  MEWS RR 0  MEWS LOC 0  MEWS Score 0  Provider Notification  Provider Name/Title [facility] PCCM provider, (spoke with Morrie Sheldon, RN)  Date Provider Notified 05/09/23  Time Provider Notified 0022  Method of Notification Call  Notification Reason Other (Comment);Critical Result (CBG 64- pt asymptomatic. hypoglycemia protocol followed. BP elevated 164/68 and to verify prn IV labetalol.)  Provider response See new orders (prn iv hydralaizne for SBP >160)  Date of Provider Response 05/09/23  Time of Provider Response [phone]

## 2023-05-09 NOTE — Progress Notes (Incomplete)
 Nutrition Follow-up  DOCUMENTATION CODES:   Not applicable  INTERVENTION:   Room service with assist Encourage PO intake with feeding assistance as needed Ensure Enlive po daily, each supplement provides 350 kcal and 20 grams of protein. MVI with minerals daily 100 mg Thiamine x 5 days   NUTRITION DIAGNOSIS:   Inadequate oral intake related to inability to eat as evidenced by NPO status.  - Progressed now on regular diet   GOAL:   Patient will meet greater than or equal to 90% of their needs  - Ongoing   MONITOR:   TF tolerance, I & O's, Vent status, Labs  REASON FOR ASSESSMENT:   Ventilator, Consult Enteral/tube feeding initiation and management  ASSESSMENT:   Pt with hx of COPD (emphysema), HLD, HTN, IBS, grave's disease, GERD, and CKD3a presented to ED with AMS and respiratory failure. Found to have COPD exacerbation, concerns for pneumonia.  3/9 - Intubated  3/10 TF started  3/11 - Extubated, TF stopped,carb modified 3/12 - Transferred out of ICU  3/13 - Regular diet   Patient resting in bed. Patient reports she lives with her sister who cooks for her at home. She eats 3 big portioned meals at home and endorses no weight loss.  Patient advanced to a regular diet yesterday and now has issues with nausea when eating. Patient has not been able to eat her meals due to zoloft not being given 30 minutes before her meal. Patient does not like the food here as well. She does need help feeding herself as she has arthritis. Patient hesitant to try Ensure.Complaining of dysuria.   Admit weight: 101.7 kg Current weight: 104.1 kg    Average Meal Intake: 3/12: 25% intake x 1 recorded meals  Nutritionally Relevant Medications: Scheduled Meds:  famotidine  20 mg Oral Daily   feeding supplement  237 mL Oral Daily   levothyroxine  50 mcg Oral QAC breakfast   mirtazapine  30 mg Oral QHS   multivitamin with minerals  1 tablet Oral Daily   polyethylene glycol  17 g Oral BID    [START ON 05/10/2023] predniSONE  10 mg Oral Q breakfast   revefenacin  175 mcg Nebulization Daily   sertraline  100 mg Oral Daily   Labs Reviewed: BUN 30, Creatinine 1.06 CBG ranges from 94-172 mg/dL over the last 24 hours HgbA1c 5.6  Diet Order:   Diet Order             Diet regular Room service appropriate? Yes with Assist; Fluid consistency: Thin  Diet effective now                   EDUCATION NEEDS:   Not appropriate for education at this time  Skin:  Skin Assessment: Skin Integrity Issues: Skin Integrity Issues:: Other (Comment) Other: Lacerations to right and left pretibial  Last BM:  05/08/23 type 7  Height:   Ht Readings from Last 1 Encounters:  05/05/23 5\' 7"  (1.702 m)    Weight:   Wt Readings from Last 1 Encounters:  05/10/23 104.1 kg    Ideal Body Weight:  61.4 kg  BMI:  Body mass index is 35.94 kg/m.  Estimated Nutritional Needs:   Kcal:  1700-1900 kcal/d  Protein:  85-100 g/d  Fluid:  1.8L/d   Elliot Dally, RD Registered Dietitian  See Amion for more information

## 2023-05-09 NOTE — NC FL2 (Signed)
 [place] MEDICAID FL2 LEVEL OF CARE FORM     IDENTIFICATION  Patient Name: Lori Orozco Birthdate: [birth year] Sex: female Admission Date (Current Location): 05/04/2023  [facility] and [place] Number:  Producer, television/film/video and Address:  [facility], [address], [place], [place] [address]      Provider Number: [id number]  Attending Physician Name and Address:  Miguel Rota, MD  Relative Name and Phone Number:  Rayna Sexton (Sister)  [phone]    Current Level of Care: Hospital Recommended Level of Care: Skilled Nursing Facility Prior Approval Number:    Date Approved/Denied:   PASRR Number: [id number]  Discharge Plan: SNF    Current Diagnoses: Patient Active Problem List   Diagnosis Date Noted   Hypertensive urgency 05/05/2023   CKD stage 3b, GFR 30-44 ml/min (HCC) 05/05/2023   COPD with acute exacerbation (HCC) 05/05/2023   Elevated troponin 05/05/2023   Acute encephalopathy 05/05/2023   Depression with anxiety 05/05/2023   Respiratory failure (HCC) 05/05/2023   Acute respiratory failure with hypoxia and hypercapnia (HCC) 05/04/2023   Cerumen impaction 03/06/2023   Lesion of palate 03/06/2023   Mobility impaired 02/10/2023   Generalized weakness 02/08/2023   Recurrent falls 12/24/2022   Pre-operative clearance 10/30/2022   Diarrhea 10/19/2022   Cold intolerance 10/19/2022   Osteoporosis 09/10/2022   Laceration of forehead 08/29/2022   Thoracic compression fracture (HCC) 07/13/2022   Diabetes mellitus screening 07/13/2022   Lumbar herniated disc    Chronic pain 11/28/2021   Bilateral leg weakness 10/09/2021   Vitamin B12 deficiency 08/02/2021   Myofascial pain dysfunction syndrome 08/02/2021   Adverse effect of prednisone 07/11/2021   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   History of pulmonary embolism 12/08/2020   Arthritis of knee 12/06/2020   Bilateral knee pain 09/20/2020   Seronegative rheumatoid arthritis  (HCC) 09/20/2020   High risk medication use 09/20/2020   Extensor tenosynovitis of right wrist 09/15/2020   S/P reverse total shoulder arthroplasty, 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   Lung nodule 01/02/2020   Status post arthroscopy of left shoulder 01/16/2018   Estrogen deficiency 06/28/2017   Medial epicondylitis, left elbow 04/29/2017   S/P arthroscopy of left shoulder 02/25/2017   Impingement syndrome of left shoulder 01/30/2017   Trochanteric bursitis, right hip 04/10/2016   Hypertension 09/02/2015   Urge incontinence 07/27/2015   Obesity (BMI 30-39.9) 04/28/2015   Osteoarthritis of left knee 01/28/2015   Status post total left knee replacement 01/28/2015   Vitamin D deficiency 04/27/2014   Seasonal and perennial allergic rhinitis 06/17/2013   S/P total hip arthroplasty 03/20/2013   Medicare annual wellness visit, subsequent 11/18/2012   At high risk for falls 11/18/2012   Routine general medical examination at a health care facility 11/10/2012   Lichen sclerosus et atrophicus of the vulva 07/29/2012   Osteopenia 10/19/2011   Hypothyroidism 10/19/2011   Hereditary and idiopathic peripheral neuropathy 08/05/2009   Low back pain 08/05/2009   HAND PAIN, BILATERAL 08/05/2009   TREMOR 03/09/2008   MENOPAUSAL SYNDROME 10/09/2007   DEPRESSION, MAJOR 05/20/2007   BIPOLAR AFFECTIVE DISORDER 05/20/2007   Generalized anxiety disorder 05/20/2007   PERSONALITY DISORDER 05/20/2007   ESOPHAGEAL SPASM 05/20/2007   Diaphragmatic hernia 05/20/2007   COPD mixed type (HCC) 03/27/2007   HYPERCHOLESTEROLEMIA, PURE 12/10/2006   SYMPTOM, SYNDROME, CHRONIC FATIGUE 12/10/2006    Orientation RESPIRATION BLADDER Height & Weight     Self, Place, Situation  O2 (2L) Incontinent, External catheter Weight: 230 lb 6.1 oz (104.5 kg) Height:  5\' 7"  (170.2 cm)  BEHAVIORAL SYMPTOMS/MOOD NEUROLOGICAL BOWEL NUTRITION STATUS      Continent Diet (see DC summary)  AMBULATORY STATUS COMMUNICATION OF NEEDS Skin   Limited  Assist Verbally Other (Comment) (Wound / Incision (Open or Dehisced) 05/05/23 Laceration Pretibial Left 4x1 scab laceration. Wound / Incision (Open or Dehisced) 05/05/23 Laceration Pretibial Right 3x1 scab laceration)                       Personal Care Assistance Level of Assistance  Bathing, Feeding, Dressing Bathing Assistance: Limited assistance Feeding assistance: Independent Dressing Assistance: Limited assistance     Functional Limitations Info  Sight, Hearing, Speech Sight Info: Adequate Hearing Info: Adequate Speech Info: Adequate    SPECIAL CARE FACTORS FREQUENCY  PT (By licensed PT), OT (By licensed OT)     PT Frequency: 5x/week OT Frequency: 5x/week            Contractures Contractures Info: Not present    Additional Factors Info  Code Status, Allergies Code Status Info: FULL Allergies Info: Lithium  Tegretol (Carbamazepine)  Tricyclic Antidepressants  Methotrexate Derivatives  Strawberry Extract  Tapentadol  Codeine  Cymbalta (Duloxetine Hcl)  Erythromycin  Lyrica (Pregabalin)  Neurontin (Gabapentin)  Rabeprazole Sodium  Baclofen  Duraprep (Antiseptic Products, Misc.)  Penicillins  Tape           Current Medications (05/09/2023):  This is the current hospital active medication list Current Facility-Administered Medications  Medication Dose Route Frequency Provider Last Rate Last Admin   acetaminophen (TYLENOL) tablet 1,000 mg  1,000 mg Oral Q8H PRN Morene Crocker, MD   1,000 mg at 05/09/23 6962   Or   acetaminophen (TYLENOL) suppository 650 mg  650 mg Rectal Q8H PRN Morene Crocker, MD       albuterol (PROVENTIL) (2.5 MG/3ML) 0.083% nebulizer solution 2.5 mg  2.5 mg Nebulization Q2H PRN Opyd, Lavone Neri, MD       apixaban (ELIQUIS) tablet 10 mg  10 mg Oral BID Melody Comas B, MD   10 mg at 05/09/23 9528   Followed by   Melene Muller ON 05/12/2023] apixaban (ELIQUIS) tablet 5 mg  5 mg Oral BID Martina Sinner, MD       arformoterol  [facility]) nebulizer solution 15 mcg  15 mcg Nebulization BID Gleason, Darcella Gasman, PA-C   15 mcg at 05/09/23 0836   budesonide (PULMICORT) nebulizer solution 0.5 mg  0.5 mg Nebulization BID Selmer Dominion B, NP   0.5 mg at 05/09/23 0836   buPROPion (WELLBUTRIN) tablet 75 mg  75 mg Oral BID Melody Comas B, MD   75 mg at 05/09/23 0842   busPIRone (BUSPAR) tablet 15 mg  15 mg Oral BID Melody Comas B, MD   15 mg at 05/09/23 0834   carvedilol (COREG) tablet 3.125 mg  3.125 mg Oral BID WC Morene Crocker, MD   3.125 mg at 05/09/23 4132   Chlorhexidine Gluconate Cloth 2 % PADS 6 each  6 each Topical Daily Leslye Peer, MD   6 each at 05/09/23 0844   dextrose 5 % in lactated ringers infusion   Intravenous Continuous Migdalia Dk, MD 75 mL/hr at 05/09/23 0534 New Bag at 05/09/23 0534   divalproex (DEPAKOTE SPRINKLE) capsule 250 mg  250 mg Oral Q12H Migdalia Dk, MD   250 mg at 05/09/23 0835   docusate sodium (COLACE) capsule 100 mg  100 mg Oral BID PRN Morene Crocker, MD  famotidine (PEPCID) tablet 20 mg  20 mg Oral Daily Martina Sinner, MD   20 mg at 05/09/23 0835   feeding supplement (PROSource TF20) liquid 60 mL  60 mL Per Tube Daily Morene Crocker, MD   60 mL at 05/07/23 0926   guaiFENesin (ROBITUSSIN) 100 MG/5ML liquid 5 mL  5 mL Oral Q4H PRN Amin, Ankit C, MD       hydrALAZINE (APRESOLINE) injection 10 mg  10 mg Intravenous Q4H PRN Amin, Ankit C, MD       ketotifen (ZADITOR) 0.035 % ophthalmic solution 1 drop  1 drop Both Eyes BID PRN Morene Crocker, MD       levothyroxine (SYNTHROID) tablet 50 mcg  50 mcg Oral QAC breakfast Melody Comas B, MD   50 mcg at 05/09/23 0531   LORazepam (ATIVAN) tablet 0.5 mg  0.5 mg Oral BID BM & HS PRN Migdalia Dk, MD   0.5 mg at 05/07/23 2212   metoprolol tartrate (LOPRESSOR) injection 5 mg  5 mg Intravenous Q4H PRN Amin, Ankit C, MD       mirtazapine (REMERON) tablet 30 mg  30 mg Oral QHS Melody Comas  B, MD   30 mg at 05/08/23 2142   ondansetron (ZOFRAN) tablet 4 mg  4 mg Oral Q6H PRN Opyd, Lavone Neri, MD   4 mg at 05/08/23 1841   Or   ondansetron (ZOFRAN) injection 4 mg  4 mg Intravenous Q6H PRN Opyd, Lavone Neri, MD   4 mg at 05/09/23 4098   Oral care mouth rinse  15 mL Mouth Rinse PRN Martina Sinner, MD       polyethylene glycol (MIRALAX / GLYCOLAX) packet 17 g  17 g Oral BID Melody Comas B, MD   17 g at 05/09/23 1191   polyethylene glycol (MIRALAX / GLYCOLAX) packet 17 g  17 g Oral Daily PRN Martina Sinner, MD       [START ON 05/10/2023] predniSONE (DELTASONE) tablet 10 mg  10 mg Oral Q breakfast Martina Sinner, MD       revefenacin (YUPELRI) nebulizer solution 175 mcg  175 mcg Nebulization Daily Gleason, Darcella Gasman, PA-C   175 mcg at 05/09/23 0836   sertraline (ZOLOFT) tablet 100 mg  100 mg Oral Daily Melody Comas B, MD   100 mg at 05/09/23 0836   simvastatin (ZOCOR) tablet 20 mg  20 mg Oral QHS Melody Comas B, MD   20 mg at 05/08/23 2141   sodium chloride flush (NS) 0.9 % injection 3 mL  3 mL Intravenous Q12H Opyd, Lavone Neri, MD   3 mL at 05/09/23 0844     Discharge Medications: Please see discharge summary for a list of discharge medications.  Relevant Imaging Results:  Relevant Lab Results:   Additional Information SSN: [id number]  Marnae Madani A [place], LCSW

## 2023-05-09 NOTE — Hospital Course (Addendum)
 Brief Narrative:   73 year old with history of COPD, tobacco use, lung nodules, CKD 3A, DM2, bipolar, fibromyalgia initially came to the ED with flulike symptoms and confusion.  Due to hypercarbic/hypoxic respiratory failure patient required intubation on 3/9 and eventually extubated on 3/10.  Workup revealed bilateral pulmonary embolism without cor pulmonale, echocardiogram showed EF 60 to 65%, lower extremity Dopplers was negative for DVT.  Due to concerns of pneumonia, started on Rocephin/azithromycin and prednisone.  Feeling much better, weaned off oxygen.  Tolerating Eliquis well.   Overall feels well. Ready for SNF.  Assessment & Plan:  Principal Problem:   Acute respiratory failure with hypoxia and hypercapnia (HCC) Active Problems:   Generalized anxiety disorder   Hypothyroidism   Seronegative rheumatoid arthritis (HCC)   Hypertensive urgency   CKD stage 3b, GFR 30-44 ml/min (HCC)   COPD with acute exacerbation (HCC)   Elevated troponin   Acute encephalopathy   Depression with anxiety   Respiratory failure (HCC)   Acute hypoxic/hypercarbic respiratory failure Acute COPD exacerbation with emphysema Bilateral segmental/subsegmental pulmonary embolism without cor pulmonale Community-acquired pneumonia, bilateral -Initially requiring intubation on 3/9, extubated 3/11.  Workup showed pulmonary embolism with concerns of pneumonia.  Started on Eliquis which patient is tolerating well.  Echocardiogram showed EF of 60 to 65%, lower extremity Dopplers negative for DVT.  Initially on heparin drip thereafter transition to p.o. Eliquis. She will be on it for atleast 6 months - 1 yr. PCP can further evaluate this depending on her condition at that time and risk/benefits.  - Completed 5 days of prednisone and Rocephin.  Completed 3 days of azithromycin. - Bronchodilators.  I-S/flutter valve   Chronic kidney disease 3A -Creatinine around baseline 1.2   Bipolar disorder -Continue home  Wellbutrin, BuSpar, Depakote, Remeron, Zoloft   Type 2 DM -Intermittent signs of hypoglycemia.  Appears to be slowly improving   Rheumatoid Arthritis - Treated for COPD, now on prednisone 10 mg daily   HTN On Coreg.  Uptitrate as necessary.  IV as needed   HLD -Statin   DVT prophylaxis: Eliquis    Code Status: Full Code Family Communication:   Status is: Inpatient Remains inpatient appropriate because: SNF placement.     Subjective:  Doing well no complaints this morning.   Examination:  General exam: Appears calm and comfortable  Respiratory system: Clear to auscultation. Respiratory effort normal. Cardiovascular system: S1 & S2 heard, RRR. No JVD, murmurs, rubs, gallops or clicks. No pedal edema. Gastrointestinal system: Abdomen is nondistended, soft and nontender. No organomegaly or masses felt. Normal bowel sounds heard. Central nervous system: Alert and oriented. No focal neurological deficits. Extremities: Symmetric 5 x 5 power. Skin: No rashes, lesions or ulcers.  Some bruising of bilateral upper extremity noted which she tells me is chronic Psychiatry: Judgement and insight appear normal. Mood & affect appropriate.

## 2023-05-09 NOTE — Progress Notes (Signed)
 PROGRESS NOTE    Lori Orozco  RUE:[id number] DOB: 03-11-1950 DOA: 05/04/2023 PCP: [name], MD    Brief Narrative:   73 year old with history of COPD, tobacco use, lung nodules, CKD 3A, DM2, bipolar, fibromyalgia initially came to the ED with flulike symptoms and confusion.  Due to hypercarbic/hypoxic respiratory failure patient required intubation on 3/9 and eventually extubated on 3/10.  Workup revealed bilateral pulmonary embolism without cor pulmonale, echocardiogram showed EF 60 to 65%, lower extremity Dopplers was negative for DVT.  Due to concerns of pneumonia, started on Rocephin/azithromycin and prednisone.  Assessment & Plan:  Principal Problem:   Acute respiratory failure with hypoxia and hypercapnia (HCC) Active Problems:   Generalized anxiety disorder   Hypothyroidism   Seronegative rheumatoid arthritis (HCC)   Hypertensive urgency   CKD stage 3b, GFR 30-44 ml/min (HCC)   COPD with acute exacerbation (HCC)   Elevated troponin   Acute encephalopathy   Depression with anxiety   Respiratory failure (HCC)   Acute hypoxic/hypercarbic respiratory failure Acute COPD exacerbation with emphysema Bilateral segmental/subsegmental pulmonary embolism without cor pulmonale Community-acquired pneumonia, bilateral -Initially requiring intubation on 3/9, extubated 3/11.  Workup showed pulmonary embolism with concerns of pneumonia.  Started on Eliquis which patient is tolerating well.  Echocardiogram showed EF of 60 to 65%, lower extremity Dopplers negative for DVT.  Initially on heparin drip thereafter transition to p.o. Eliquis. - Completed 5 days of prednisone and Rocephin.  Completed 3 days of azithromycin. - Bronchodilators.  I-S/flutter valve   Chronic kidney disease 3A -Creatinine around baseline 1.2   Bipolar disorder -Continue home Wellbutrin, BuSpar, Depakote, Remeron, Zoloft   Type 2 DM -Intermittent signs of hypoglycemia.  Currently on D5 LR.    Rheumatoid Arthritis -Currently prednisone has been increased to treat COPD exacerbation, slowly transition to home 10 mg daily regimen.  Home biologic will be resumed upon discharge   HTN On Coreg.  Uptitrate as necessary.  IV as needed   HLD -Statin   DVT prophylaxis: Eliquis    Code Status: Full Code Family Communication:   Status is: Inpatient Remains inpatient appropriate because:     Subjective: Feeling better no complaints   Examination:  General exam: Appears calm and comfortable  Respiratory system: Clear to auscultation. Respiratory effort normal. Cardiovascular system: S1 & S2 heard, RRR. No JVD, murmurs, rubs, gallops or clicks. No pedal edema. Gastrointestinal system: Abdomen is nondistended, soft and nontender. No organomegaly or masses felt. Normal bowel sounds heard. Central nervous system: Alert and oriented. No focal neurological deficits. Extremities: Symmetric 5 x 5 power. Skin: No rashes, lesions or ulcers.  Some bruising of bilateral upper extremity noted which she tells me is chronic Psychiatry: Judgement and insight appear normal. Mood & affect appropriate.                Diet Orders (From admission, onward)     Start     Ordered   05/09/23 0810  Diet regular Room service appropriate? Yes; Fluid consistency: Thin  Diet effective now       Question Answer Comment  Room service appropriate? Yes   Fluid consistency: Thin      05/09/23 0809            Objective: Vitals:   05/09/23 0536 05/09/23 0836 05/09/23 0837 05/09/23 0845  BP:    (!) 142/53  Pulse:    96  Resp:    18  Temp:    98.6 F (37 C)  TempSrc:  Oral  SpO2:  97% 97% 100%  Weight: 104.5 kg     Height:        Intake/Output Summary (Last 24 hours) at 05/09/2023 1117 Last data filed at 05/09/2023 1610 Gross per 24 hour  Intake 256.02 ml  Output --  Net 256.02 ml   Filed Weights   05/07/23 0419 05/08/23 0500 05/09/23 0536  Weight: 99.9 kg 98.4 kg 104.5 kg     Scheduled Meds:  apixaban  10 mg Oral BID   Followed by   Melene Muller ON 05/12/2023] apixaban  5 mg Oral BID   arformoterol  15 mcg Nebulization BID   budesonide (PULMICORT) nebulizer solution  0.5 mg Nebulization BID   buPROPion  75 mg Oral BID   busPIRone  15 mg Oral BID   carvedilol  3.125 mg Oral BID WC   Chlorhexidine Gluconate Cloth  6 each Topical Daily   divalproex  250 mg Oral Q12H   famotidine  20 mg Oral Daily   feeding supplement (PROSource TF20)  60 mL Per Tube Daily   levothyroxine  50 mcg Oral QAC breakfast   mirtazapine  30 mg Oral QHS   polyethylene glycol  17 g Oral BID   [START ON 05/10/2023] predniSONE  10 mg Oral Q breakfast   revefenacin  175 mcg Nebulization Daily   sertraline  100 mg Oral Daily   simvastatin  20 mg Oral QHS   sodium chloride flush  3 mL Intravenous Q12H   Continuous Infusions:  dextrose 5% lactated ringers 75 mL/hr at 05/09/23 0534    Nutritional status Signs/Symptoms: NPO status Interventions: Refer to RD note for recommendations Body mass index is 36.08 kg/m.  Data Reviewed:   CBC: Recent Labs  Lab 05/04/23 2137 05/04/23 2146 05/05/23 0427 05/05/23 0623 05/06/23 0020 05/07/23 0547 05/08/23 0311  WBC 7.2  --  6.9  --  6.8 6.3 10.1  NEUTROABS 2.6  --   --   --   --   --   --   HGB 12.7   < > 12.6 13.9 13.2 12.4 12.8  HCT 42.3   < > 42.4 41.0 42.4 38.6 42.0  MCV 104.4*  --  106.3*  --  99.8 96.5 101.9*  PLT 190  --  172  --  162 179 212   < > = values in this interval not displayed.   Basic Metabolic Panel: Recent Labs  Lab 05/04/23 2137 05/04/23 2146 05/05/23 0427 05/05/23 0623 05/06/23 0020 05/06/23 1049 05/06/23 2042 05/07/23 0547 05/07/23 1709 05/08/23 0311  NA 143   < > 139 138 138  --   --  138  --  138  K 4.2   < > 4.4 4.4 3.5  --   --  3.5  --  4.3  CL 96*  --  99  --  102  --   --  107  --  105  CO2 32  --  31  --  23  --   --  21*  --  25  GLUCOSE 125*  --  154*  --  148*  --   --  136*  --  73  BUN  24*  --  21  --  26*  --   --  48*  --  56*  CREATININE 1.17*  --  1.02*  --  1.22*  --   --  1.10*  --  1.27*  CALCIUM 9.1  --  8.3*  --  8.7*  --   --  8.2*  --  8.8*  MG  --   --   --   --   --  1.7 1.8 3.1* 3.1*  --   PHOS  --   --   --   --   --  3.4 2.7 3.1 6.1*  --    < > = values in this interval not displayed.   GFR: Estimated Creatinine Clearance: 49.8 mL/min (A) (by C-G formula based on SCr of 1.27 mg/dL (H)). Liver Function Tests: Recent Labs  Lab 05/04/23 2137  AST 37  ALT 28  ALKPHOS 40  BILITOT 0.9  PROT 6.5  ALBUMIN 3.2*   No results for input(s): "LIPASE", "AMYLASE" in the last 168 hours. Recent Labs  Lab 05/04/23 2138  AMMONIA 35   Coagulation Profile: No results for input(s): "INR", "PROTIME" in the last 168 hours. Cardiac Enzymes: No results for input(s): "CKTOTAL", "CKMB", "CKMBINDEX", "TROPONINI" in the last 168 hours. BNP (last 3 results) No results for input(s): "PROBNP" in the last 8760 hours. HbA1C: No results for input(s): "HGBA1C" in the last 72 hours. CBG: Recent Labs  Lab 05/08/23 2009 05/09/23 0010 05/09/23 0035 05/09/23 0400 05/09/23 0831  GLUCAP 80 64* 82 72 113*   Lipid Profile: No results for input(s): "CHOL", "HDL", "LDLCALC", "TRIG", "CHOLHDL", "LDLDIRECT" in the last 72 hours. Thyroid Function Tests: No results for input(s): "TSH", "T4TOTAL", "FREET4", "T3FREE", "THYROIDAB" in the last 72 hours. Anemia Panel: No results for input(s): "VITAMINB12", "FOLATE", "FERRITIN", "TIBC", "IRON", "RETICCTPCT" in the last 72 hours. Sepsis Labs: Recent Labs  Lab 05/04/23 2137  LATICACIDVEN 1.3    Recent Results (from the past 240 hours)  Culture, blood (routine x 2)     Status: None   Collection Time: 05/04/23  9:37 PM   Specimen: BLOOD  Result Value Ref Range Status   Specimen Description BLOOD SITE NOT SPECIFIED  Final   Special Requests   Final    BOTTLES DRAWN AEROBIC AND ANAEROBIC Blood Culture results may not be optimal due  to an inadequate volume of blood received in culture bottles   Culture   Final    NO GROWTH 5 DAYS Performed at [facility], [address] [place], [place] [address]    Report Status 05/09/2023 FINAL  Final  Resp panel by RT-PCR (RSV, Flu A&B, Covid) Anterior Nasal Swab     Status: None   Collection Time: 05/04/23  9:38 PM   Specimen: Anterior Nasal Swab  Result Value Ref Range Status   SARS Coronavirus 2 by RT PCR NEGATIVE NEGATIVE Final   Influenza A by PCR NEGATIVE NEGATIVE Final   Influenza B by PCR NEGATIVE NEGATIVE Final    Comment: (NOTE) The Xpert Xpress SARS-CoV-2/FLU/RSV plus assay is intended as an aid in the diagnosis of influenza from Nasopharyngeal swab specimens and should not be used as a sole basis for treatment. Nasal washings and aspirates are unacceptable for Xpert Xpress SARS-CoV-2/FLU/RSV testing.  Fact Sheet for Patients: BloggerCourse.com  Fact Sheet for Healthcare Providers: SeriousBroker.it  This test is not yet approved or cleared by the [place] FDA and has been authorized for detection and/or diagnosis of SARS-CoV-2 by FDA under an Emergency Use Authorization (EUA). This EUA will remain in effect (meaning this test can be used) for the duration of the COVID-19 declaration under Section 564(b)(1) of the Act, 21 U.S.C. section 360bbb-3(b)(1), unless the authorization is terminated or revoked.     Resp Syncytial  Virus by PCR NEGATIVE NEGATIVE Final    Comment: (NOTE) Fact Sheet for Patients: BloggerCourse.com  Fact Sheet for Healthcare Providers: SeriousBroker.it  This test is not yet approved or cleared by the [place] FDA and has been authorized for detection and/or diagnosis of SARS-CoV-2 by FDA under an Emergency Use Authorization (EUA). This EUA will remain in effect (meaning this test can be used) for the duration of  the COVID-19 declaration under Section 564(b)(1) of the Act, 21 U.S.C. section 360bbb-3(b)(1), unless the authorization is terminated or revoked.  Performed at [facility], [address] [place], [place] [address]   Culture, blood (routine x 2)     Status: None   Collection Time: 05/04/23  9:42 PM   Specimen: BLOOD  Result Value Ref Range Status   Specimen Description BLOOD SITE NOT SPECIFIED  Final   Special Requests   Final    BOTTLES DRAWN AEROBIC AND ANAEROBIC Blood Culture adequate volume   Culture   Final    NO GROWTH 5 DAYS Performed at [facility], [address] [place], [place] [address]    Report Status 05/09/2023 FINAL  Final  MRSA Next Gen by PCR, Nasal     Status: None   Collection Time: 05/05/23  8:25 AM   Specimen: Nasal Mucosa; Nasal Swab  Result Value Ref Range Status   MRSA by PCR Next Gen NOT DETECTED NOT DETECTED Final    Comment: (NOTE) The GeneXpert MRSA Assay (FDA approved for NASAL specimens only), is one component of a comprehensive MRSA colonization surveillance program. It is not intended to diagnose MRSA infection nor to guide or monitor treatment for MRSA infections. Test performance is not FDA approved in patients less than 60 years old. Performed at [facility], [address] [place], [place] [address]   Culture, Respiratory w Gram Stain     Status: None   Collection Time: 05/06/23 10:07 AM   Specimen: Tracheal Aspirate; Respiratory  Result Value Ref Range Status   Specimen Description TRACHEAL ASPIRATE  Final   Special Requests NONE  Final   Gram Stain   Final    ABUNDANT WBC PRESENT, PREDOMINANTLY PMN NO ORGANISMS SEEN    Culture   Final    RARE Normal respiratory flora-no Staph aureus or Pseudomonas seen Performed at [facility], [address] [place], [place] [address]    Report Status 05/08/2023 FINAL  Final         Radiology Studies: No results found.         LOS: 5 days   Time  spent= 35 mins    Miguel Rota, MD [facility]  If 7PM-7AM, please contact night-coverage  05/09/2023, 11:17 AM

## 2023-05-09 NOTE — Plan of Care (Signed)
 0400: CBG 72, pt asymptomatic. VSS, continues on 2L O2, nasal cannula. Elink provider called and spoke with Morrie Sheldon, RN to update blood sugar readings. New order received to start D5 LR @ 37ml/hr. Safety maintained. Bed alarm on. Call bell in reach. Will continue to monitor.    Problem: Education: Goal: Ability to describe self-care measures that may prevent or decrease complications (Diabetes Survival Skills Education) will improve Outcome: Progressing Goal: Individualized Educational Video(s) Outcome: Progressing   Problem: Coping: Goal: Ability to adjust to condition or change in health will improve Outcome: Progressing   Problem: Fluid Volume: Goal: Ability to maintain a balanced intake and output will improve Outcome: Progressing   Problem: Health Behavior/Discharge Planning: Goal: Ability to identify and utilize available resources and services will improve Outcome: Progressing Goal: Ability to manage health-related needs will improve Outcome: Progressing   Problem: Metabolic: Goal: Ability to maintain appropriate glucose levels will improve Outcome: Progressing   Problem: Nutritional: Goal: Maintenance of adequate nutrition will improve Outcome: Progressing Goal: Progress toward achieving an optimal weight will improve Outcome: Progressing   Problem: Skin Integrity: Goal: Risk for impaired skin integrity will decrease Outcome: Progressing   Problem: Tissue Perfusion: Goal: Adequacy of tissue perfusion will improve Outcome: Progressing   Problem: Education: Goal: Knowledge of General Education information will improve Description: Including pain rating scale, medication(s)/side effects and non-pharmacologic comfort measures Outcome: Progressing   Problem: Health Behavior/Discharge Planning: Goal: Ability to manage health-related needs will improve Outcome: Progressing   Problem: Clinical Measurements: Goal: Ability to maintain clinical measurements within  normal limits will improve Outcome: Progressing Goal: Will remain free from infection Outcome: Progressing Goal: Diagnostic test results will improve Outcome: Progressing Goal: Respiratory complications will improve Outcome: Progressing Goal: Cardiovascular complication will be avoided Outcome: Progressing   Problem: Activity: Goal: Risk for activity intolerance will decrease Outcome: Progressing   Problem: Nutrition: Goal: Adequate nutrition will be maintained Outcome: Progressing   Problem: Coping: Goal: Level of anxiety will decrease Outcome: Progressing   Problem: Elimination: Goal: Will not experience complications related to bowel motility Outcome: Progressing Goal: Will not experience complications related to urinary retention Outcome: Progressing   Problem: Pain Managment: Goal: General experience of comfort will improve and/or be controlled Outcome: Progressing   Problem: Safety: Goal: Ability to remain free from injury will improve Outcome: Progressing   Problem: Skin Integrity: Goal: Risk for impaired skin integrity will decrease Outcome: Progressing   Problem: Education: Goal: Knowledge of disease or condition will improve Outcome: Progressing Goal: Knowledge of the prescribed therapeutic regimen will improve Outcome: Progressing Goal: Individualized Educational Video(s) Outcome: Progressing   Problem: Activity: Goal: Ability to tolerate increased activity will improve Outcome: Progressing Goal: Will verbalize the importance of balancing activity with adequate rest periods Outcome: Progressing   Problem: Respiratory: Goal: Ability to maintain a clear airway will improve Outcome: Progressing Goal: Levels of oxygenation will improve Outcome: Progressing Goal: Ability to maintain adequate ventilation will improve Outcome: Progressing   Problem: Safety: Goal: Non-violent Restraint(s) Outcome: Progressing

## 2023-05-10 ENCOUNTER — Encounter: Payer: Self-pay | Admitting: Family Medicine

## 2023-05-10 DIAGNOSIS — J9602 Acute respiratory failure with hypercapnia: Secondary | ICD-10-CM | POA: Diagnosis not present

## 2023-05-10 DIAGNOSIS — J9601 Acute respiratory failure with hypoxia: Secondary | ICD-10-CM | POA: Diagnosis not present

## 2023-05-10 LAB — CBC
HCT: 37.9 % (ref 36.0–46.0)
Hemoglobin: 11.7 g/dL — ABNORMAL LOW (ref 12.0–15.0)
MCH: 31 pg (ref 26.0–34.0)
MCHC: 30.9 g/dL (ref 30.0–36.0)
MCV: 100.3 fL — ABNORMAL HIGH (ref 80.0–100.0)
Platelets: 215 10*3/uL (ref 150–400)
RBC: 3.78 MIL/uL — ABNORMAL LOW (ref 3.87–5.11)
RDW: 17.5 % — ABNORMAL HIGH (ref 11.5–15.5)
WBC: 8.2 10*3/uL (ref 4.0–10.5)
nRBC: 0 % (ref 0.0–0.2)

## 2023-05-10 LAB — LEGIONELLA PNEUMOPHILA SEROGP 1 UR AG: L. pneumophila Serogp 1 Ur Ag: NEGATIVE

## 2023-05-10 LAB — BASIC METABOLIC PANEL
Anion gap: 8 (ref 5–15)
BUN: 30 mg/dL — ABNORMAL HIGH (ref 8–23)
CO2: 28 mmol/L (ref 22–32)
Calcium: 9.1 mg/dL (ref 8.9–10.3)
Chloride: 103 mmol/L (ref 98–111)
Creatinine, Ser: 1.06 mg/dL — ABNORMAL HIGH (ref 0.44–1.00)
GFR, Estimated: 56 mL/min — ABNORMAL LOW (ref >60)
Glucose, Bld: 93 mg/dL (ref 70–99)
Potassium: 3.9 mmol/L (ref 3.5–5.1)
Sodium: 139 mmol/L (ref 135–145)

## 2023-05-10 LAB — URINALYSIS, ROUTINE W REFLEX MICROSCOPIC
Bilirubin Urine: NEGATIVE
Glucose, UA: NEGATIVE mg/dL
Hgb urine dipstick: NEGATIVE
Ketones, ur: NEGATIVE mg/dL
Leukocytes,Ua: NEGATIVE
Nitrite: NEGATIVE
Protein, ur: NEGATIVE mg/dL
Specific Gravity, Urine: 1.014 (ref 1.005–1.030)
pH: 6 (ref 5.0–8.0)

## 2023-05-10 LAB — GLUCOSE, CAPILLARY
Glucose-Capillary: 94 mg/dL (ref 70–99)
Glucose-Capillary: 95 mg/dL (ref 70–99)
Glucose-Capillary: 94 mg/dL (ref 70–99)
Glucose-Capillary: 93 mg/dL (ref 70–99)

## 2023-05-10 LAB — MAGNESIUM: Magnesium: 2.2 mg/dL (ref 1.7–2.4)

## 2023-05-10 LAB — PHOSPHORUS: Phosphorus: 3.3 mg/dL (ref 2.5–4.6)

## 2023-05-10 MED ORDER — MIRABEGRON ER 25 MG PO TB24
25.0000 mg | ORAL_TABLET | Freq: Every day | ORAL | Status: DC
  Administered 2023-05-10 – 2023-05-11 (×3): 25 mg via ORAL
  Filled 2023-05-10 (×3): qty 1

## 2023-05-10 NOTE — Progress Notes (Signed)
 Physical Therapy Treatment Patient Details Name: Lori Orozco MRN: [id number] DOB: Jul 29, 1950 Today's Date: 05/10/2023   History of Present Illness 73 y.o. F admitted 3/8 with flu symptoms, AMS, PNA and bil PE. VDRF 3/9-3/11.  PMHx: COPD, former tobacco use, lung nodules, CKD, DM, bipolar disorder, fibromyalgia, RA, HLD, THA, Lt TKA    PT Comments  Pt pleasant with fragile skin and tear noted on finger. Pt able to transition to sitting, standing and gait with CGA and RW. Pt eager to get stronger and return home but reports sister cannot care for her. Pt on RA on arrival with noted desaturation to 88% during gait with 2 standing rests and cues for pursed lip breathing. 92% on RA at rest, will need to continue to assess with mobility and pt benefits from cues not to hold her breath. Pt educated for HEP and continued progression. Will continue to follow.      If plan is discharge home, recommend the following: A little help with walking and/or transfers;A little help with bathing/dressing/bathroom;Assistance with cooking/housework;Assist for transportation;Help with stairs or ramp for entrance   Can travel by private vehicle     Yes  Equipment Recommendations  None recommended by PT    Recommendations for Other Services       Precautions / Restrictions Precautions Precautions: Fall;Other (comment) Recall of Precautions/Restrictions: Intact Precaution/Restrictions Comments: watch sats     Mobility  Bed Mobility Overal bed mobility: Needs Assistance Bed Mobility: Supine to Sit     Supine to sit: Supervision, HOB elevated     General bed mobility comments: HOB 20 degrees with increased time to rise    Transfers Overall transfer level: Needs assistance   Transfers: Sit to/from Stand Sit to Stand: Contact guard assist           General transfer comment: cues for hand placement with pt rising from bed and BSC without physical assist. CGA for safety     Ambulation/Gait Ambulation/Gait assistance: Contact guard assist Gait Distance (Feet): 150 Feet Assistive device: Rolling walker (2 wheels) Gait Pattern/deviations: Step-through pattern, Decreased stride length, Trunk flexed   Gait velocity interpretation: <1.8 ft/sec, indicate of risk for recurrent falls   General Gait Details: cues for direction and posture with reliance on RW and pt self-regulating distance. Pt on RA with desaturation to 88% with walking   Stairs             Wheelchair Mobility     Tilt Bed    Modified Rankin (Stroke Patients Only)       Balance Overall balance assessment: Needs assistance Sitting-balance support: No upper extremity supported, Feet supported Sitting balance-Leahy Scale: Fair     Standing balance support: Bilateral upper extremity supported, During functional activity, Reliant on assistive device for balance, Single extremity supported Standing balance-Leahy Scale: Poor                              Communication Communication Communication: No apparent difficulties  Cognition Arousal: Alert Behavior During Therapy: WFL for tasks assessed/performed   PT - Cognitive impairments: No apparent impairments                           Following commands impaired: Follows one step commands with increased time    Cueing Cueing Techniques: Verbal cues  Exercises General Exercises - Lower Extremity Long Arc Quad: AROM, Both, 20 reps, Seated Hip Flexion/Marching:  AROM, Both, 20 reps, Seated    General Comments        Pertinent Vitals/Pain Pain Assessment Pain Assessment: No/denies pain    Home Living                          Prior Function            PT Goals (current goals can now be found in the care plan section) Progress towards PT goals: Progressing toward goals    Frequency    Min 2X/week      PT Plan      Co-evaluation              AM-PAC PT "6 Clicks"  Mobility   Outcome Measure  Help needed turning from your back to your side while in a flat bed without using bedrails?: None Help needed moving from lying on your back to sitting on the side of a flat bed without using bedrails?: None Help needed moving to and from a bed to a chair (including a wheelchair)?: A Little Help needed standing up from a chair using your arms (e.g., wheelchair or bedside chair)?: A Little Help needed to walk in hospital room?: A Little Help needed climbing 3-5 steps with a railing? : A Lot 6 Click Score: 19    End of Session   Activity Tolerance: Patient tolerated treatment well Patient left: in chair;with call bell/phone within reach;with chair alarm set Nurse Communication: Mobility status PT Visit Diagnosis: Unsteadiness on feet (R26.81);Muscle weakness (generalized) (M62.81);Other abnormalities of gait and mobility (R26.89)     Time: 1914-7829 PT Time Calculation (min) (ACUTE ONLY): 40 min  Charges:    $Gait Training: 8-22 mins $Therapeutic Exercise: 8-22 mins $Therapeutic Activity: 8-22 mins PT General Charges $$ ACUTE PT VISIT: 1 Visit                     Merryl Hacker, PT Acute Rehabilitation Services Office: [phone]    Kamare Caspers B Mckennah Kretchmer 05/10/2023, 11:18 AM

## 2023-05-10 NOTE — Discharge Instructions (Signed)
 Information on my medicine - ELIQUIS (apixaban)  This medication education was reviewed with me or my healthcare representative as part of my discharge preparation.    Why was Eliquis prescribed for you? Eliquis was prescribed to treat blood clots that may have been found in the veins of your legs (deep vein thrombosis) or in your lungs (pulmonary embolism) and to reduce the risk of them occurring again.  What do You need to know about Eliquis ? The starting dose is 10 mg (two 5 mg tablets) taken TWICE daily for the FIRST SEVEN (7) DAYS, then on 05/12/23  the dose is reduced to ONE 5 mg tablet taken TWICE daily.  Eliquis may be taken with or without food.   Try to take the dose about the same time in the morning and in the evening. If you have difficulty swallowing the tablet whole please discuss with your pharmacist how to take the medication safely.  Take Eliquis exactly as prescribed and DO NOT stop taking Eliquis without talking to the doctor who prescribed the medication.  Stopping may increase your risk of developing a new blood clot.  Refill your prescription before you run out.  After discharge, you should have regular check-up appointments with your healthcare provider that is prescribing your Eliquis.    What do you do if you miss a dose? If a dose of ELIQUIS is not taken at the scheduled time, take it as soon as possible on the same day and twice-daily administration should be resumed. The dose should not be doubled to make up for a missed dose.  Important Safety Information A possible side effect of Eliquis is bleeding. You should call your healthcare provider right away if you experience any of the following: Bleeding from an injury or your nose that does not stop. Unusual colored urine (red or dark brown) or unusual colored stools (red or black). Unusual bruising for unknown reasons. A serious fall or if you hit your head (even if there is no bleeding).  Some  medicines may interact with Eliquis and might increase your risk of bleeding or clotting while on Eliquis. To help avoid this, consult your healthcare provider or pharmacist prior to using any new prescription or non-prescription medications, including herbals, vitamins, non-steroidal anti-inflammatory drugs (NSAIDs) and supplements.  This website has more information on Eliquis (apixaban): http://www.eliquis.com/eliquis/home

## 2023-05-10 NOTE — Progress Notes (Signed)
 This RN was made aware by PT that patient was missing a gold wedding band. RN went to room to ask patient about the ring. Family member at bedside states she took the patient's ring home when the patient was in the ICU.

## 2023-05-10 NOTE — Plan of Care (Signed)
  Problem: Education: Goal: Ability to describe self-care measures that may prevent or decrease complications (Diabetes Survival Skills Education) will improve Outcome: Progressing Goal: Individualized Educational Video(s) Outcome: Progressing   Problem: Coping: Goal: Ability to adjust to condition or change in health will improve Outcome: Progressing   Problem: Fluid Volume: Goal: Ability to maintain a balanced intake and output will improve Outcome: Progressing   Problem: Health Behavior/Discharge Planning: Goal: Ability to identify and utilize available resources and services will improve Outcome: Progressing Goal: Ability to manage health-related needs will improve Outcome: Progressing   Problem: Metabolic: Goal: Ability to maintain appropriate glucose levels will improve Outcome: Progressing   Problem: Nutritional: Goal: Maintenance of adequate nutrition will improve Outcome: Progressing Goal: Progress toward achieving an optimal weight will improve Outcome: Progressing   Problem: Skin Integrity: Goal: Risk for impaired skin integrity will decrease Outcome: Progressing   Problem: Tissue Perfusion: Goal: Adequacy of tissue perfusion will improve Outcome: Progressing   Problem: Education: Goal: Knowledge of General Education information will improve Description: Including pain rating scale, medication(s)/side effects and non-pharmacologic comfort measures Outcome: Progressing   Problem: Health Behavior/Discharge Planning: Goal: Ability to manage health-related needs will improve Outcome: Progressing   Problem: Clinical Measurements: Goal: Ability to maintain clinical measurements within normal limits will improve Outcome: Progressing Goal: Will remain free from infection Outcome: Progressing Goal: Diagnostic test results will improve Outcome: Progressing Goal: Respiratory complications will improve Outcome: Progressing Goal: Cardiovascular complication will  be avoided Outcome: Progressing   Problem: Activity: Goal: Risk for activity intolerance will decrease Outcome: Progressing   Problem: Nutrition: Goal: Adequate nutrition will be maintained Outcome: Progressing   Problem: Coping: Goal: Level of anxiety will decrease Outcome: Progressing   Problem: Elimination: Goal: Will not experience complications related to bowel motility Outcome: Progressing Goal: Will not experience complications related to urinary retention Outcome: Progressing   Problem: Pain Managment: Goal: General experience of comfort will improve and/or be controlled Outcome: Progressing   Problem: Safety: Goal: Ability to remain free from injury will improve Outcome: Progressing   Problem: Skin Integrity: Goal: Risk for impaired skin integrity will decrease Outcome: Progressing   Problem: Education: Goal: Knowledge of disease or condition will improve Outcome: Progressing Goal: Knowledge of the prescribed therapeutic regimen will improve Outcome: Progressing Goal: Individualized Educational Video(s) Outcome: Progressing   Problem: Activity: Goal: Ability to tolerate increased activity will improve Outcome: Progressing Goal: Will verbalize the importance of balancing activity with adequate rest periods Outcome: Progressing   Problem: Respiratory: Goal: Ability to maintain a clear airway will improve Outcome: Progressing Goal: Levels of oxygenation will improve Outcome: Progressing Goal: Ability to maintain adequate ventilation will improve Outcome: Progressing   Problem: Safety: Goal: Non-violent Restraint(s) Outcome: Progressing

## 2023-05-10 NOTE — Progress Notes (Signed)
 Mobility Specialist: Progress Note   05/10/23 1539  Mobility  Activity Transferred from bed to chair;Transferred to/from [facility]  Level of Assistance Contact guard assist, steadying assist  Assistive Device Front wheel walker  Activity Response Tolerated well  Mobility Referral Yes  Mobility visit 1 Mobility  Mobility Specialist Start Time (ACUTE ONLY) 1500  Mobility Specialist Stop Time (ACUTE ONLY) 1515  Mobility Specialist Time Calculation (min) (ACUTE ONLY) 15 min    Pt requesting to return to bed - received in chair. SV for initial stand with CG for stand pivot to [facility]. Light MinA for second and third stand from [facility] and EOB d/t fatigue. Left in bed with all needs met, call bell in reach. Bed alarm on.   Maurene Capes Mobility Specialist Please contact via SecureChat or Rehab office at [phone]

## 2023-05-10 NOTE — Progress Notes (Signed)
 PROGRESS NOTE    Lori Orozco  YQM:[id number] DOB: 01/24/1951 DOA: 05/04/2023 PCP: [name], MD    Brief Narrative:   73 year old with history of COPD, tobacco use, lung nodules, CKD 3A, DM2, bipolar, fibromyalgia initially came to the ED with flulike symptoms and confusion.  Due to hypercarbic/hypoxic respiratory failure patient required intubation on 3/9 and eventually extubated on 3/10.  Workup revealed bilateral pulmonary embolism without cor pulmonale, echocardiogram showed EF 60 to 65%, lower extremity Dopplers was negative for DVT.  Due to concerns of pneumonia, started on Rocephin/azithromycin and prednisone.  Feeling much better, weaned off oxygen.  Tolerating Eliquis well.  Pending SNF placement.  Assessment & Plan:  Principal Problem:   Acute respiratory failure with hypoxia and hypercapnia (HCC) Active Problems:   Generalized anxiety disorder   Hypothyroidism   Seronegative rheumatoid arthritis (HCC)   Hypertensive urgency   CKD stage 3b, GFR 30-44 ml/min (HCC)   COPD with acute exacerbation (HCC)   Elevated troponin   Acute encephalopathy   Depression with anxiety   Respiratory failure (HCC)   Acute hypoxic/hypercarbic respiratory failure Acute COPD exacerbation with emphysema Bilateral segmental/subsegmental pulmonary embolism without cor pulmonale Community-acquired pneumonia, bilateral -Initially requiring intubation on 3/9, extubated 3/11.  Workup showed pulmonary embolism with concerns of pneumonia.  Started on Eliquis which patient is tolerating well.  Echocardiogram showed EF of 60 to 65%, lower extremity Dopplers negative for DVT.  Initially on heparin drip thereafter transition to p.o. Eliquis. - Completed 5 days of prednisone and Rocephin.  Completed 3 days of azithromycin. - Bronchodilators.  I-S/flutter valve   Chronic kidney disease 3A -Creatinine around baseline 1.2   Bipolar disorder -Continue home Wellbutrin, BuSpar, Depakote, Remeron,  Zoloft   Type 2 DM -Intermittent signs of hypoglycemia.  Appears to be slowly improving   Rheumatoid Arthritis - Treated for COPD, now on prednisone 10 mg daily   HTN On Coreg.  Uptitrate as necessary.  IV as needed   HLD -Statin   DVT prophylaxis: Eliquis    Code Status: Full Code Family Communication:   Status is: Inpatient Remains inpatient appropriate because: Pending placement    Subjective:  Feels much better today.  Off oxygen Overnight had urinary burning, UA is negative  Examination:  General exam: Appears calm and comfortable  Respiratory system: Clear to auscultation. Respiratory effort normal. Cardiovascular system: S1 & S2 heard, RRR. No JVD, murmurs, rubs, gallops or clicks. No pedal edema. Gastrointestinal system: Abdomen is nondistended, soft and nontender. No organomegaly or masses felt. Normal bowel sounds heard. Central nervous system: Alert and oriented. No focal neurological deficits. Extremities: Symmetric 5 x 5 power. Skin: No rashes, lesions or ulcers.  Some bruising of bilateral upper extremity noted which she tells me is chronic Psychiatry: Judgement and insight appear normal. Mood & affect appropriate.                Diet Orders (From admission, onward)     Start     Ordered   05/10/23 0929  Diet regular Room service appropriate? Yes with Assist; Fluid consistency: Thin  Diet effective now       Question Answer Comment  Room service appropriate? Yes with Assist   Fluid consistency: Thin      05/10/23 0929            Objective: Vitals:   05/10/23 0428 05/10/23 0456 05/10/23 0845 05/10/23 1115  BP: (!) 144/62  (!) 146/57   Pulse: 86  87  Resp: 18  (!) 22   Temp: 98.8 F (37.1 C)  98.9 F (37.2 C)   TempSrc: Oral  Oral   SpO2: 91%  91% 90%  Weight:  104.1 kg    Height:        Intake/Output Summary (Last 24 hours) at 05/10/2023 1144 Last data filed at 05/10/2023 0600 Gross per 24 hour  Intake 1830.79 ml   Output 1600 ml  Net 230.79 ml   Filed Weights   05/08/23 0500 05/09/23 0536 05/10/23 0456  Weight: 98.4 kg 104.5 kg 104.1 kg    Scheduled Meds:  apixaban  10 mg Oral BID   Followed by   Melene Muller ON 05/12/2023] apixaban  5 mg Oral BID   arformoterol  15 mcg Nebulization BID   budesonide (PULMICORT) nebulizer solution  0.5 mg Nebulization BID   buPROPion  75 mg Oral BID   busPIRone  15 mg Oral BID   carvedilol  3.125 mg Oral BID WC   Chlorhexidine Gluconate Cloth  6 each Topical Daily   divalproex  250 mg Oral Q12H   famotidine  20 mg Oral Daily   feeding supplement  237 mL Oral Daily   levothyroxine  50 mcg Oral QAC breakfast   mirtazapine  30 mg Oral QHS   multivitamin with minerals  1 tablet Oral Daily   polyethylene glycol  17 g Oral BID   predniSONE  10 mg Oral Q breakfast   revefenacin  175 mcg Nebulization Daily   sertraline  100 mg Oral Daily   simvastatin  20 mg Oral QHS   sodium chloride flush  3 mL Intravenous Q12H   thiamine  100 mg Oral Daily   Continuous Infusions:  Nutritional status Signs/Symptoms: NPO status Interventions: Refer to RD note for recommendations Body mass index is 35.94 kg/m.  Data Reviewed:   CBC: Recent Labs  Lab 05/04/23 2137 05/04/23 2146 05/05/23 0427 05/05/23 0623 05/06/23 0020 05/07/23 0547 05/08/23 0311 05/10/23 0752  WBC 7.2  --  6.9  --  6.8 6.3 10.1 8.2  NEUTROABS 2.6  --   --   --   --   --   --   --   HGB 12.7   < > 12.6 13.9 13.2 12.4 12.8 11.7*  HCT 42.3   < > 42.4 41.0 42.4 38.6 42.0 37.9  MCV 104.4*  --  106.3*  --  99.8 96.5 101.9* 100.3*  PLT 190  --  172  --  162 179 212 215   < > = values in this interval not displayed.   Basic Metabolic Panel: Recent Labs  Lab 05/05/23 0427 05/05/23 0623 05/06/23 0020 05/06/23 1049 05/06/23 2042 05/07/23 0547 05/07/23 1709 05/08/23 0311 05/10/23 0752  NA 139 138 138  --   --  138  --  138 139  K 4.4 4.4 3.5  --   --  3.5  --  4.3 3.9  CL 99  --  102  --   --   107  --  105 103  CO2 31  --  23  --   --  21*  --  25 28  GLUCOSE 154*  --  148*  --   --  136*  --  73 93  BUN 21  --  26*  --   --  48*  --  56* 30*  CREATININE 1.02*  --  1.22*  --   --  1.10*  --  1.27* 1.06*  CALCIUM 8.3*  --  8.7*  --   --  8.2*  --  8.8* 9.1  MG  --   --   --  1.7 1.8 3.1* 3.1*  --  2.2  PHOS  --   --   --  3.4 2.7 3.1 6.1*  --  3.3   GFR: Estimated Creatinine Clearance: 59.5 mL/min (A) (by C-G formula based on SCr of 1.06 mg/dL (H)). Liver Function Tests: Recent Labs  Lab 05/04/23 2137  AST 37  ALT 28  ALKPHOS 40  BILITOT 0.9  PROT 6.5  ALBUMIN 3.2*   No results for input(s): "LIPASE", "AMYLASE" in the last 168 hours. Recent Labs  Lab 05/04/23 2138  AMMONIA 35   Coagulation Profile: No results for input(s): "INR", "PROTIME" in the last 168 hours. Cardiac Enzymes: No results for input(s): "CKTOTAL", "CKMB", "CKMBINDEX", "TROPONINI" in the last 168 hours. BNP (last 3 results) No results for input(s): "PROBNP" in the last 8760 hours. HbA1C: No results for input(s): "HGBA1C" in the last 72 hours. CBG: Recent Labs  Lab 05/09/23 1251 05/09/23 1703 05/09/23 2116 05/10/23 0425 05/10/23 0842  GLUCAP 173* 150* 120* 94 95   Lipid Profile: No results for input(s): "CHOL", "HDL", "LDLCALC", "TRIG", "CHOLHDL", "LDLDIRECT" in the last 72 hours. Thyroid Function Tests: No results for input(s): "TSH", "T4TOTAL", "FREET4", "T3FREE", "THYROIDAB" in the last 72 hours. Anemia Panel: No results for input(s): "VITAMINB12", "FOLATE", "FERRITIN", "TIBC", "IRON", "RETICCTPCT" in the last 72 hours. Sepsis Labs: Recent Labs  Lab 05/04/23 2137  LATICACIDVEN 1.3    Recent Results (from the past 240 hours)  Culture, blood (routine x 2)     Status: None   Collection Time: 05/04/23  9:37 PM   Specimen: BLOOD  Result Value Ref Range Status   Specimen Description BLOOD SITE NOT SPECIFIED  Final   Special Requests   Final    BOTTLES DRAWN AEROBIC AND ANAEROBIC  Blood Culture results may not be optimal due to an inadequate volume of blood received in culture bottles   Culture   Final    NO GROWTH 5 DAYS Performed at [facility], [address], [place], [place] [address]    Report Status 05/09/2023 FINAL  Final  Resp panel by RT-PCR (RSV, Flu A&B, Covid) Anterior Nasal Swab     Status: None   Collection Time: 05/04/23  9:38 PM   Specimen: Anterior Nasal Swab  Result Value Ref Range Status   SARS Coronavirus 2 by RT PCR NEGATIVE NEGATIVE Final   Influenza A by PCR NEGATIVE NEGATIVE Final   Influenza B by PCR NEGATIVE NEGATIVE Final    Comment: (NOTE) The Xpert Xpress SARS-CoV-2/FLU/RSV plus assay is intended as an aid in the diagnosis of influenza from Nasopharyngeal swab specimens and should not be used as a sole basis for treatment. Nasal washings and aspirates are unacceptable for Xpert Xpress SARS-CoV-2/FLU/RSV testing.  Fact Sheet for Patients: BloggerCourse.com  Fact Sheet for Healthcare Providers: SeriousBroker.it  This test is not yet approved or cleared by the [place] FDA and has been authorized for detection and/or diagnosis of SARS-CoV-2 by FDA under an Emergency Use Authorization (EUA). This EUA will remain in effect (meaning this test can be used) for the duration of the COVID-19 declaration under Section 564(b)(1) of the Act, 21 U.S.C. section 360bbb-3(b)(1), unless the authorization is terminated or revoked.     Resp Syncytial Virus by PCR NEGATIVE NEGATIVE Final    Comment: (NOTE) Fact Sheet for Patients:  BloggerCourse.com  Fact Sheet for Healthcare Providers: SeriousBroker.it  This test is not yet approved or cleared by the [place] FDA and has been authorized for detection and/or diagnosis of SARS-CoV-2 by FDA under an Emergency Use Authorization (EUA). This EUA will remain in effect (meaning this  test can be used) for the duration of the COVID-19 declaration under Section 564(b)(1) of the Act, 21 U.S.C. section 360bbb-3(b)(1), unless the authorization is terminated or revoked.  Performed at [facility], [address], [place], [place] [address]   Culture, blood (routine x 2)     Status: None   Collection Time: 05/04/23  9:42 PM   Specimen: BLOOD  Result Value Ref Range Status   Specimen Description BLOOD SITE NOT SPECIFIED  Final   Special Requests   Final    BOTTLES DRAWN AEROBIC AND ANAEROBIC Blood Culture adequate volume   Culture   Final    NO GROWTH 5 DAYS Performed at [facility], [address] [place], [place] [address]    Report Status 05/09/2023 FINAL  Final  MRSA Next Gen by PCR, Nasal     Status: None   Collection Time: 05/05/23  8:25 AM   Specimen: Nasal Mucosa; Nasal Swab  Result Value Ref Range Status   MRSA by PCR Next Gen NOT DETECTED NOT DETECTED Final    Comment: (NOTE) The GeneXpert MRSA Assay (FDA approved for NASAL specimens only), is one component of a comprehensive MRSA colonization surveillance program. It is not intended to diagnose MRSA infection nor to guide or monitor treatment for MRSA infections. Test performance is not FDA approved in patients less than 29 years old. Performed at [facility], [address], [place], [place] [address]   Culture, Respiratory w Gram Stain     Status: None   Collection Time: 05/06/23 10:07 AM   Specimen: Tracheal Aspirate; Respiratory  Result Value Ref Range Status   Specimen Description TRACHEAL ASPIRATE  Final   Special Requests NONE  Final   Gram Stain   Final    ABUNDANT WBC PRESENT, PREDOMINANTLY PMN NO ORGANISMS SEEN    Culture   Final    RARE Normal respiratory flora-no Staph aureus or Pseudomonas seen Performed at [facility], [address], [place], [place] [address]    Report Status 05/08/2023 FINAL  Final         Radiology Studies: No results  found.         LOS: 6 days   Time spent= 35 mins    Miguel Rota, MD [facility]  If 7PM-7AM, please contact night-coverage  05/10/2023, 11:44 AM

## 2023-05-10 NOTE — Progress Notes (Signed)
 Overnight floor coverage  Patient complaining of dysuria.  Already completed antibiotic course for pneumonia.  UA ordered to rule out UTI.

## 2023-05-10 NOTE — TOC Progression Note (Signed)
 Transition of Care [facility]) - Progression Note    Patient Details  Name: Lori Orozco MRN: [id number] Date of Birth: 12-05-1950  [facility] of [facility]) CM/SW Contact  Charlie Seda A [place], LCSW Phone Number: 05/10/2023, 12:17 PM  Clinical Narrative:     CSW met with pt  at bedside to provide bed offers with Medicare.gov ratings. They stated they wanted [name] for SNF placement.  [facility] stated bed available tomorrow. Provider notified. EDD Saturday 3/15.   TOC will continue to follow.    Expected Discharge Plan: Skilled Nursing Facility Barriers to Discharge: Continued Medical Work up, SNF Pending bed offer  Expected Discharge Plan and Services                                               Social Determinants of Health (SDOH) Interventions SDOH Screenings   Food Insecurity: Patient Unable To Answer (05/05/2023)  Recent Concern: Food Insecurity - Food Insecurity Present (02/04/2023)  Housing: Patient Unable To Answer (05/05/2023)  Transportation Needs: Patient Unable To Answer (05/05/2023)  Utilities: Patient Unable To Answer (05/05/2023)  Alcohol Screen: Low Risk  (08/23/2022)  Depression (PHQ2-9): High Risk (02/08/2023)  Financial Resource Strain: Medium Risk (02/04/2023)  Physical Activity: Inactive (02/04/2023)  Social Connections: Patient Unable To Answer (05/05/2023)  Stress: Stress Concern Present (02/04/2023)  Tobacco Use: Medium Risk (05/05/2023)    Readmission Risk Interventions     No data to display

## 2023-05-11 DIAGNOSIS — J9601 Acute respiratory failure with hypoxia: Secondary | ICD-10-CM | POA: Diagnosis not present

## 2023-05-11 DIAGNOSIS — J9602 Acute respiratory failure with hypercapnia: Secondary | ICD-10-CM | POA: Diagnosis not present

## 2023-05-11 LAB — CBC
HCT: 41.6 % (ref 36.0–46.0)
Hemoglobin: 13 g/dL (ref 12.0–15.0)
MCH: 31.1 pg (ref 26.0–34.0)
MCHC: 31.3 g/dL (ref 30.0–36.0)
MCV: 99.5 fL (ref 80.0–100.0)
Platelets: 219 10*3/uL (ref 150–400)
RBC: 4.18 MIL/uL (ref 3.87–5.11)
RDW: 17.4 % — ABNORMAL HIGH (ref 11.5–15.5)
WBC: 7 10*3/uL (ref 4.0–10.5)
nRBC: 0 % (ref 0.0–0.2)

## 2023-05-11 LAB — GLUCOSE, CAPILLARY
Glucose-Capillary: 79 mg/dL (ref 70–99)
Glucose-Capillary: 85 mg/dL (ref 70–99)
Glucose-Capillary: 87 mg/dL (ref 70–99)

## 2023-05-11 LAB — BASIC METABOLIC PANEL
Anion gap: 7 (ref 5–15)
BUN: 27 mg/dL — ABNORMAL HIGH (ref 8–23)
CO2: 29 mmol/L (ref 22–32)
Calcium: 9.1 mg/dL (ref 8.9–10.3)
Chloride: 103 mmol/L (ref 98–111)
Creatinine, Ser: 1.35 mg/dL — ABNORMAL HIGH (ref 0.44–1.00)
GFR, Estimated: 42 mL/min — ABNORMAL LOW (ref >60)
Glucose, Bld: 84 mg/dL (ref 70–99)
Potassium: 4.3 mmol/L (ref 3.5–5.1)
Sodium: 139 mmol/L (ref 135–145)

## 2023-05-11 LAB — MAGNESIUM: Magnesium: 2 mg/dL (ref 1.7–2.4)

## 2023-05-11 MED ORDER — FAMOTIDINE 20 MG PO TABS: 20.0000 mg | ORAL_TABLET | Freq: Every day | ORAL | Status: DC

## 2023-05-11 MED ORDER — VITAMIN B-1 100 MG PO TABS
100.0000 mg | ORAL_TABLET | Freq: Every day | ORAL | Status: DC
Start: 2023-05-11 — End: 2023-11-11

## 2023-05-11 MED ORDER — ADULT MULTIVITAMIN W/MINERALS CH: 1.0000 | ORAL_TABLET | Freq: Every day | ORAL | Status: DC

## 2023-05-11 MED ORDER — APIXABAN 5 MG PO TABS: ORAL_TABLET | ORAL | Status: DC

## 2023-05-11 MED ORDER — CARVEDILOL 3.125 MG PO TABS: 3.1250 mg | ORAL_TABLET | Freq: Two times a day (BID) | ORAL | Status: DC

## 2023-05-11 MED ORDER — IPRATROPIUM-ALBUTEROL 0.5-2.5 (3) MG/3ML IN SOLN: 3.0000 mL | Freq: Two times a day (BID) | RESPIRATORY_TRACT | Status: AC

## 2023-05-11 MED ORDER — LORAZEPAM 1 MG PO TABS: 1.0000 mg | ORAL_TABLET | Freq: Every evening | ORAL | 0 refills | Status: AC | PRN

## 2023-05-11 MED ORDER — POLYETHYLENE GLYCOL 3350 17 G PO PACK: 17.0000 g | PACK | Freq: Two times a day (BID) | ORAL | Status: DC | PRN

## 2023-05-11 MED ORDER — ENSURE ENLIVE PO LIQD: 237.0000 mL | Freq: Every day | ORAL | Status: DC

## 2023-05-11 MED ORDER — HYDROCODONE-ACETAMINOPHEN 5-325 MG PO TABS: 1.0000 | ORAL_TABLET | Freq: Three times a day (TID) | ORAL | 0 refills | Status: DC | PRN

## 2023-05-11 MED ORDER — DOCUSATE SODIUM 100 MG PO CAPS: 100.0000 mg | ORAL_CAPSULE | Freq: Two times a day (BID) | ORAL | Status: DC | PRN

## 2023-05-11 MED ORDER — TRAMADOL HCL 50 MG PO TABS
50.0000 mg | ORAL_TABLET | Freq: Four times a day (QID) | ORAL | 0 refills | Status: DC | PRN
Start: 2023-05-11 — End: 2023-11-11

## 2023-05-11 NOTE — Plan of Care (Signed)
  Problem: Fluid Volume: Goal: Ability to maintain a balanced intake and output will improve Outcome: Progressing   Problem: Metabolic: Goal: Ability to maintain appropriate glucose levels will improve Outcome: Progressing   Problem: Tissue Perfusion: Goal: Adequacy of tissue perfusion will improve Outcome: Progressing   Problem: Education: Goal: Knowledge of General Education information will improve Description: Including pain rating scale, medication(s)/side effects and non-pharmacologic comfort measures Outcome: Progressing   Problem: Health Behavior/Discharge Planning: Goal: Ability to manage health-related needs will improve Outcome: Progressing   Problem: Clinical Measurements: Goal: Will remain free from infection Outcome: Progressing Goal: Diagnostic test results will improve Outcome: Progressing   Problem: Activity: Goal: Risk for activity intolerance will decrease Outcome: Progressing   Problem: Coping: Goal: Level of anxiety will decrease Outcome: Progressing   Problem: Safety: Goal: Ability to remain free from injury will improve Outcome: Progressing   Problem: Skin Integrity: Goal: Risk for impaired skin integrity will decrease Outcome: Progressing   Problem: Respiratory: Goal: Ability to maintain a clear airway will improve Outcome: Progressing Goal: Levels of oxygenation will improve Outcome: Progressing   Problem: Safety: Goal: Non-violent Restraint(s) Outcome: Progressing

## 2023-05-11 NOTE — Discharge Summary (Signed)
 Physician Discharge Summary  Lori Orozco:[id number] DOB: [birth year] DOA: 05/04/2023  PCP: [name], MD  Admit date: 05/04/2023 Discharge date: 05/11/2023  Admitted From: Home Disposition:  SNF  Recommendations for Outpatient Follow-up:  Follow up with PCP in 1-2 weeks Please obtain BMP/CBC in one week your next doctors visit.  PO Eliquis for 6 months - 1 yr.  Change BB to Coreg.  Bowel regimen prescribed.    Discharge Condition: Stable CODE STATUS: Full Diet recommendation: Diabetic  Brief/Interim Summary: Brief Narrative:   73 year old with history of COPD, tobacco use, lung nodules, CKD 3A, DM2, bipolar, fibromyalgia initially came to the ED with flulike symptoms and confusion.  Due to hypercarbic/hypoxic respiratory failure patient required intubation on 3/9 and eventually extubated on 3/10.  Workup revealed bilateral pulmonary embolism without cor pulmonale, echocardiogram showed EF 60 to 65%, lower extremity Dopplers was negative for DVT.  Due to concerns of pneumonia, started on Rocephin/azithromycin and prednisone.  Feeling much better, weaned off oxygen.  Tolerating Eliquis well.   Overall feels well. Ready for SNF.  Assessment & Plan:  Principal Problem:   Acute respiratory failure with hypoxia and hypercapnia (HCC) Active Problems:   Generalized anxiety disorder   Hypothyroidism   Seronegative rheumatoid arthritis (HCC)   Hypertensive urgency   CKD stage 3b, GFR 30-44 ml/min (HCC)   COPD with acute exacerbation (HCC)   Elevated troponin   Acute encephalopathy   Depression with anxiety   Respiratory failure (HCC)   Acute hypoxic/hypercarbic respiratory failure Acute COPD exacerbation with emphysema Bilateral segmental/subsegmental pulmonary embolism without cor pulmonale Community-acquired pneumonia, bilateral -Initially requiring intubation on 3/9, extubated 3/11.  Workup showed pulmonary embolism with concerns of pneumonia.  Started on Eliquis  which patient is tolerating well.  Echocardiogram showed EF of 60 to 65%, lower extremity Dopplers negative for DVT.  Initially on heparin drip thereafter transition to p.o. Eliquis. She will be on it for atleast 6 months - 1 yr. PCP can further evaluate this depending on her condition at that time and risk/benefits.  - Completed 5 days of prednisone and Rocephin.  Completed 3 days of azithromycin. - Bronchodilators.  I-S/flutter valve   Chronic kidney disease 3A -Creatinine around baseline 1.2   Bipolar disorder -Continue home Wellbutrin, BuSpar, Depakote, Remeron, Zoloft   Type 2 DM -Intermittent signs of hypoglycemia.  Appears to be slowly improving   Rheumatoid Arthritis - Treated for COPD, now on prednisone 10 mg daily   HTN On Coreg.  Uptitrate as necessary.  IV as needed   HLD -Statin   DVT prophylaxis: Eliquis    Code Status: Full Code Family Communication:   Status is: Inpatient Remains inpatient appropriate because: SNF placement.     Subjective:  Doing well no complaints this morning.   Examination:  General exam: Appears calm and comfortable  Respiratory system: Clear to auscultation. Respiratory effort normal. Cardiovascular system: S1 & S2 heard, RRR. No JVD, murmurs, rubs, gallops or clicks. No pedal edema. Gastrointestinal system: Abdomen is nondistended, soft and nontender. No organomegaly or masses felt. Normal bowel sounds heard. Central nervous system: Alert and oriented. No focal neurological deficits. Extremities: Symmetric 5 x 5 power. Skin: No rashes, lesions or ulcers.  Some bruising of bilateral upper extremity noted which she tells me is chronic Psychiatry: Judgement and insight appear normal. Mood & affect appropriate.    Discharge Diagnoses:  Principal Problem:   Acute respiratory failure with hypoxia and hypercapnia (HCC) Active Problems:   Generalized anxiety disorder  Hypothyroidism   Seronegative rheumatoid arthritis (HCC)    Hypertensive urgency   CKD stage 3b, GFR 30-44 ml/min (HCC)   COPD with acute exacerbation (HCC)   Elevated troponin   Acute encephalopathy   Depression with anxiety   Respiratory failure [facility])      Discharge Exam: Vitals:   05/11/23 0404 05/11/23 0832  BP: (!) 170/69 (!) 163/61  Pulse: 92 88  Resp: 18 16  Temp: 98.2 F (36.8 C) 98.2 F (36.8 C)  SpO2: 90% 92%   Vitals:   05/10/23 2223 05/11/23 0052 05/11/23 0404 05/11/23 0832  BP: 125/77  (!) 170/69 (!) 163/61  Pulse: 72  92 88  Resp:   18 16  Temp:   98.2 F (36.8 C) 98.2 F (36.8 C)  TempSrc:   Oral Oral  SpO2: 94% 95% 90% 92%  Weight:   108.3 kg   Height:          Discharge Instructions   Allergies as of 05/11/2023       Reactions   Lithium Anaphylaxis   Tegretol [carbamazepine] Other (See Comments)   Fever and body aches (fever over 103)   Tricyclic Antidepressants Anaphylaxis   Other reaction(s): Unknown   Methotrexate Derivatives Other (See Comments)   Urinary retention and dizziness  Other reaction(s): Other UTI   Strawberry Extract Hives   Tapentadol Rash   PT ALLERGIC NYLON TAPE    Codeine Nausea Only   Makes pt stay awake   Cymbalta [duloxetine Hcl] Other (See Comments)   Makes pt pass out   Erythromycin    abdominal pain   Lyrica [pregabalin]    Felt faint   Neurontin [gabapentin]    Passes  out   Rabeprazole Sodium    insomnia   Baclofen Nausea Only   Sever nausea   Duraprep [antiseptic Products, Misc.] Itching, Rash   Tolerates Betadine   Penicillins Rash   Tolerated ANCEF 02/02/20 Has patient had a PCN reaction causing immediate rash, facial/tongue/throat swelling, SOB or lightheadedness with hypotension: Yes Has patient had a PCN reaction causing severe rash involving mucus membranes or skin necrosis: No Has patient had a PCN reaction that required hospitalization No Has patient had a PCN reaction occurring within the last 10 years: No If all of the above answers are "NO",  then may proceed with Cephalosporin use.   Tape Rash   PT ALLERGIC NYLON TAPE         Medication List     STOP taking these medications    ibuprofen 200 MG tablet Commonly known as: ADVIL   metoprolol succinate 25 MG 24 hr tablet Commonly known as: TOPROL-XL       TAKE these medications    acetaminophen 500 MG tablet Commonly known as: TYLENOL Take 500 mg by mouth every 6 (six) hours as needed.   AeroChamber Plus inhaler Use with inhaler   alendronate 70 MG tablet Commonly known as: FOSAMAX TAKE 1 TABLET BY MOUTH ONCE A WEEK. TAKE WITH A FULL GLASS OF WATER ON AN EMPTY STOMACH.   apixaban 5 MG Tabs tablet Commonly known as: ELIQUIS Take 2 tablets (10 mg total) by mouth 2 (two) times daily for 4 days, THEN 1 tablet (5 mg total) 2 (two) times daily. Start taking on: May 11, 2023   Black Cohosh 40 MG Caps Take 1 capsule (40 mg total) by mouth daily.   Breztri Aerosphere 160-9-4.8 MCG/ACT Aero Generic drug: budeson-glycopyrrolate-formoterol Inhale 2 puffs into the lungs 2 (two) times  daily.   buPROPion 150 MG 24 hr tablet Commonly known as: WELLBUTRIN XL Take 150 mg by mouth daily.   busPIRone 15 MG tablet Commonly known as: BUSPAR Take 15 mg by mouth 2 (two) times daily before a meal.   carvedilol 3.125 MG tablet Commonly known as: COREG Take 1 tablet (3.125 mg total) by mouth 2 (two) times daily with a meal.   cholecalciferol 25 MCG (1000 UNIT) tablet Commonly known as: VITAMIN D3 Take 2,000 Units by mouth daily. 2 tablets daily   clobetasol cream 0.05 % Commonly known as: TEMOVATE APPLY SMALL AMOUNT TO AFFECTED AREAS TWO TO THREE TIMES PER WEEK   cyanocobalamin 2000 MCG tablet Take 500 mcg by mouth daily.   diclofenac Sodium 1 % Gel Commonly known as: VOLTAREN Apply 2 g topically 2 (two) times daily as needed (pain).   diphenhydrAMINE 25 MG tablet Commonly known as: BENADRYL Take 25 mg by mouth every 6 (six) hours as needed for allergies.    divalproex 500 MG 24 hr tablet Commonly known as: DEPAKOTE ER Take 500 mg by mouth at bedtime.   docusate sodium 100 MG capsule Commonly known as: COLACE Take 1 capsule (100 mg total) by mouth 2 (two) times daily as needed for mild constipation.   Enbrel SureClick 50 MG/ML injection Generic drug: etanercept Inject 50 mg into the skin once a week.   Estroven Complete 4 MG Tabs Generic drug: Rhubarb Take 4 mg by mouth daily.   famotidine 20 MG tablet Commonly known as: PEPCID Take 1 tablet (20 mg total) by mouth daily.   feeding supplement Liqd Take 237 mLs by mouth daily.   fluticasone 50 MCG/ACT nasal spray Commonly known as: FLONASE PLACE 2 SPRAYS INTO THE NOSE DAILY.   HYDROcodone-acetaminophen 5-325 MG tablet Commonly known as: NORCO/VICODIN Take 1 tablet by mouth 3 (three) times daily as needed for severe pain (pain score 7-10). What changed: reasons to take this   hydrocortisone cream 1 % Apply 1 Application topically 2 (two) times daily as needed for itching.   ipratropium 0.06 % nasal spray Commonly known as: ATROVENT Place 2 sprays into both nostrils 4 (four) times daily. Patient states she uses 2 times per day.   ipratropium-albuterol 0.5-2.5 (3) MG/3ML Soln Commonly known as: DUONEB Take 3 mLs by nebulization 2 (two) times daily. What changed:  when to take this reasons to take this   ketotifen 0.025 % ophthalmic solution Commonly known as: ZADITOR Place 1 drop into both eyes 2 (two) times daily as needed (allergies).   levothyroxine 50 MCG tablet Commonly known as: SYNTHROID TAKE 1 TABLET BY MOUTH DAILY BEFORE BREAKFAST   LORazepam 1 MG tablet Commonly known as: ATIVAN Take 1 tablet (1 mg total) by mouth at bedtime as needed for anxiety or sleep.   mirabegron ER 25 MG Tb24 tablet Commonly known as: Myrbetriq Take 1 tablet (25 mg total) by mouth in the morning and at bedtime.   mirtazapine 30 MG tablet Commonly known as: REMERON Take 30 mg  by mouth at bedtime.   multivitamin with minerals Tabs tablet Take 1 tablet by mouth daily.   ondansetron 4 MG disintegrating tablet Commonly known as: ZOFRAN-ODT Take 1 tablet (4 mg total) by mouth every 8 (eight) hours as needed for nausea or vomiting.   polyethylene glycol 17 g packet Commonly known as: MIRALAX / GLYCOLAX Take 17 g by mouth 2 (two) times daily as needed for moderate constipation or severe constipation.   predniSONE 10 MG  tablet Commonly known as: DELTASONE TAKE 1 TABLET (10 MG TOTAL) BY MOUTH DAILY WITH BREAKFAST.   sertraline 100 MG tablet Commonly known as: ZOLOFT Take 100 mg by mouth daily.   simvastatin 20 MG tablet Commonly known as: ZOCOR TAKE 1 TABLET BY MOUTH AT BEDTIME   thiamine 100 MG tablet Commonly known as: Vitamin B-1 Take 1 tablet (100 mg total) by mouth daily.   tirzepatide 2.5 MG/0.5ML injection vial Commonly known as: ZEPBOUND Inject 2.5 mg into the skin once a week.   traMADol 50 MG tablet Commonly known as: ULTRAM Take 1 tablet (50 mg total) by mouth every 6 (six) hours as needed for moderate pain (pain score 4-6).        Contact information for after-discharge care     Destination     [facility] Preferred SNF .   Service: Skilled Nursing Contact information: [address] [place] [address] [phone]                    Allergies  Allergen Reactions   Lithium Anaphylaxis   Tegretol [Carbamazepine] Other (See Comments)    Fever and body aches (fever over 103)   Tricyclic Antidepressants Anaphylaxis    Other reaction(s): Unknown   Methotrexate Derivatives Other (See Comments)    Urinary retention and dizziness  Other reaction(s): Other UTI   Strawberry Extract Hives   Tapentadol Rash    PT ALLERGIC NYLON TAPE    Codeine Nausea Only    Makes pt stay awake   Cymbalta [Duloxetine Hcl] Other (See Comments)    Makes pt pass out    Erythromycin      abdominal pain   Lyrica [Pregabalin]     Felt faint   Neurontin [Gabapentin]     Passes  out   Rabeprazole Sodium     insomnia   Baclofen Nausea Only    Sever nausea   Duraprep [Antiseptic Products, Misc.] Itching and Rash    Tolerates Betadine    Penicillins Rash    Tolerated ANCEF 02/02/20  Has patient had a PCN reaction causing immediate rash, facial/tongue/throat swelling, SOB or lightheadedness with hypotension: Yes Has patient had a PCN reaction causing severe rash involving mucus membranes or skin necrosis: No Has patient had a PCN reaction that required hospitalization No Has patient had a PCN reaction occurring within the last 10 years: No If all of the above answers are "NO", then may proceed with Cephalosporin use.    Tape Rash    PT ALLERGIC NYLON TAPE     You were cared for by a hospitalist during your hospital stay. If you have any questions about your discharge medications or the care you received while you were in the hospital after you are discharged, you can call the unit and asked to speak with the hospitalist on call if the hospitalist that took care of you is not available. Once you are discharged, your primary care physician will handle any further medical issues. Please note that no refills for any discharge medications will be authorized once you are discharged, as it is imperative that you return to your primary care physician (or establish a relationship with a primary care physician if you do not have one) for your aftercare needs so that they can reassess your need for medications and monitor your lab values.  You were cared for by a hospitalist during your hospital stay. If you have any questions about your discharge  medications or the care you received while you were in the hospital after you are discharged, you can call the unit and asked to speak with the hospitalist on call if the hospitalist that took care of you is not available. Once you are discharged,  your primary care physician will handle any further medical issues. Please note that NO REFILLS for any discharge medications will be authorized once you are discharged, as it is imperative that you return to your primary care physician (or establish a relationship with a primary care physician if you do not have one) for your aftercare needs so that they can reassess your need for medications and monitor your lab values.  Please request your Prim.MD to go over all Hospital Tests and Procedure/Radiological results at the follow up, please get all Hospital records sent to your Prim MD by signing hospital release before you go home.  Get CBC, CMP, 2 view Chest X ray checked  by Primary MD during your next visit or SNF MD in 5-7 days ( we routinely change or add medications that can affect your baseline labs and fluid status, therefore we recommend that you get the mentioned basic workup next visit with your PCP, your PCP may decide not to get them or add new tests based on their clinical decision)  On your next visit with your primary care physician please Get Medicines reviewed and adjusted.  If you experience worsening of your admission symptoms, develop shortness of breath, life threatening emergency, suicidal or homicidal thoughts you must seek medical attention immediately by calling 911 or calling your MD immediately  if symptoms less severe.  You Must read complete instructions/literature along with all the possible adverse reactions/side effects for all the Medicines you take and that have been prescribed to you. Take any new Medicines after you have completely understood and accpet all the possible adverse reactions/side effects.   Do not drive, operate heavy machinery, perform activities at heights, swimming or participation in water activities or provide baby sitting services if your were admitted for syncope or siezures until you have seen by Primary MD or a Neurologist and advised to do so  again.  Do not drive when taking Pain medications.   Procedures/Studies: VAS [place] LOWER EXTREMITY VENOUS (DVT) Result Date: 05/06/2023  Lower Venous DVT Study Patient Name:  Lori Orozco  Date of Exam:   05/06/2023 Medical Rec #: [id number]            Accession #:    [id number] Date of Birth: 10/16/1950            Patient Gender: F Patient Age:   29 years Exam Location:  [facility] Procedure:      VAS [place] LOWER EXTREMITY VENOUS (DVT) Referring Phys: Melody Comas --------------------------------------------------------------------------------  Indications: Pulmonary embolism.  Risk Factors: Confirmed PE. Anticoagulation: Heparin. Limitations: Poor ultrasound/tissue interface, body habitus and patient positioning, patient immobility. Comparison Study: No prior studies. Performing Technologist: Chanda Busing RVT  Examination Guidelines: A complete evaluation includes B-mode imaging, spectral Doppler, color Doppler, and power Doppler as needed of all accessible portions of each vessel. Bilateral testing is considered an integral part of a complete examination. Limited examinations for reoccurring indications may be performed as noted. The reflux portion of the exam is performed with the patient in reverse Trendelenburg.  +---------+---------------+---------+-----------+----------+--------------+ RIGHT    CompressibilityPhasicitySpontaneityPropertiesThrombus Aging +---------+---------------+---------+-----------+----------+--------------+ CFV      Full           Yes  Yes                                 +---------+---------------+---------+-----------+----------+--------------+ SFJ      Full                                                        +---------+---------------+---------+-----------+----------+--------------+ FV Prox  Full                                                        +---------+---------------+---------+-----------+----------+--------------+ FV  Mid   Full                                                        +---------+---------------+---------+-----------+----------+--------------+ FV Distal               Yes      Yes                                 +---------+---------------+---------+-----------+----------+--------------+ PFV      Full                                                        +---------+---------------+---------+-----------+----------+--------------+ POP      Full           Yes      Yes                                 +---------+---------------+---------+-----------+----------+--------------+ PTV      Full                                                        +---------+---------------+---------+-----------+----------+--------------+ PERO     Full                                                        +---------+---------------+---------+-----------+----------+--------------+   +---------+---------------+---------+-----------+----------+-------------------+ LEFT     CompressibilityPhasicitySpontaneityPropertiesThrombus Aging      +---------+---------------+---------+-----------+----------+-------------------+ CFV      Full           Yes      Yes                                      +---------+---------------+---------+-----------+----------+-------------------+ SFJ  Full                                                             +---------+---------------+---------+-----------+----------+-------------------+ FV Prox  Full                                                             +---------+---------------+---------+-----------+----------+-------------------+ FV Mid   Full                                                             +---------+---------------+---------+-----------+----------+-------------------+ FV Distal               Yes      Yes                                       +---------+---------------+---------+-----------+----------+-------------------+ PFV      Full                                                             +---------+---------------+---------+-----------+----------+-------------------+ POP      Full           Yes      Yes                                      +---------+---------------+---------+-----------+----------+-------------------+ PTV      Full                                                             +---------+---------------+---------+-----------+----------+-------------------+ PERO                                                  Not well visualized +---------+---------------+---------+-----------+----------+-------------------+     Summary: RIGHT: - There is no evidence of deep vein thrombosis in the lower extremity. However, portions of this examination were limited- see technologist comments above.  - No cystic structure found in the popliteal fossa.  LEFT: - There is no evidence of deep vein thrombosis in the lower extremity. However, portions of this examination were limited- see technologist comments above.  - No cystic structure found in the popliteal fossa.  *See table(s) above for measurements and observations. Electronically signed by Sherald Hess MD on 05/06/2023 at  3:55:20 PM.    Final    ECHOCARDIOGRAM COMPLETE Result Date: 05/06/2023    ECHOCARDIOGRAM REPORT   Patient Name:   Lori Orozco Date of Exam: 05/06/2023 Medical Rec #:  [id number]           Height:       67.0 in Accession #:    [id number]          Weight:       216.9 lb Date of Birth:  [birth year]           BSA:          2.093 m Patient Age:    72 years            BP:           166/72 mmHg Patient Gender: F                   HR:           106 bpm. Exam Location:  Inpatient Procedure: 2D Echo, Cardiac Doppler and Color Doppler (Both Spectral and Color            Flow Doppler were utilized during procedure). Indications:     Cardiomyopathy-Unspecified  History:        Patient has no prior history of Echocardiogram examinations.                 COPD and CKD, stage 3; Risk Factors:Hypertension and Diabetes.  Sonographer:    Lucendia Herrlich RCS Referring Phys: [id number] ADITYA PALIWAL IMPRESSIONS  1. Left ventricular ejection fraction, by estimation, is 60 to 65%. The left ventricle has normal function. The left ventricle has no regional wall motion abnormalities. There is mild concentric left ventricular hypertrophy. Left ventricular diastolic parameters are consistent with Grade I diastolic dysfunction (impaired relaxation).  2. Right ventricular systolic function is normal. The right ventricular size is normal.  3. The mitral valve is normal in structure. Trivial mitral valve regurgitation. No evidence of mitral stenosis.  4. The aortic valve is tricuspid. There is mild calcification of the aortic valve. Aortic valve regurgitation is not visualized. Aortic valve sclerosis/calcification is present, without any evidence of aortic stenosis.  5. The inferior vena cava is normal in size with greater than 50% respiratory variability, suggesting right atrial pressure of 3 mmHg. FINDINGS  Left Ventricle: Left ventricular ejection fraction, by estimation, is 60 to 65%. The left ventricle has normal function. The left ventricle has no regional wall motion abnormalities. The left ventricular internal cavity size was normal in size. There is  mild concentric left ventricular hypertrophy. Left ventricular diastolic parameters are consistent with Grade I diastolic dysfunction (impaired relaxation). Right Ventricle: The right ventricular size is normal. No increase in right ventricular wall thickness. Right ventricular systolic function is normal. Left Atrium: Left atrial size was normal in size. Right Atrium: Right atrial size was normal in size. Pericardium: There is no evidence of pericardial effusion. Mitral Valve: The mitral valve is normal in  structure. Mild mitral annular calcification. Trivial mitral valve regurgitation. No evidence of mitral valve stenosis. Tricuspid Valve: The tricuspid valve is normal in structure. Tricuspid valve regurgitation is trivial. No evidence of tricuspid stenosis. Aortic Valve: The aortic valve is tricuspid. There is mild calcification of the aortic valve. Aortic valve regurgitation is not visualized. Aortic valve sclerosis/calcification is present, without any evidence of aortic stenosis. Aortic valve peak gradient measures 7.1 mmHg. Pulmonic Valve: The pulmonic valve was normal in structure. Pulmonic  valve regurgitation is not visualized. No evidence of pulmonic stenosis. Aorta: The aortic root is normal in size and structure. Venous: The inferior vena cava is normal in size with greater than 50% respiratory variability, suggesting right atrial pressure of 3 mmHg. IAS/Shunts: No atrial level shunt detected by color flow Doppler.  LEFT VENTRICLE PLAX 2D LVIDd:         4.70 cm   Diastology LVIDs:         3.10 cm   LV e' medial:    5.55 cm/s LV PW:         0.90 cm   LV E/e' medial:  14.5 LV IVS:        0.60 cm   LV e' lateral:   7.07 cm/s LVOT diam:     1.90 cm   LV E/e' lateral: 11.4 LV SV:         61 LV SV Index:   29 LVOT Area:     2.84 cm  RIGHT VENTRICLE             IVC RV S prime:     12.50 cm/s  IVC diam: 1.90 cm TAPSE (M-mode): 2.1 cm LEFT ATRIUM             Index        RIGHT ATRIUM           Index LA diam:        4.20 cm 2.01 cm/m   RA Area:     16.70 cm LA Vol (A2C):   56.9 ml 27.18 ml/m  RA Volume:   41.00 ml  19.59 ml/m LA Vol (A4C):   55.7 ml 26.61 ml/m LA Biplane Vol: 59.6 ml 28.47 ml/m  AORTIC VALVE AV Area (Vmax): 1.73 cm AV Vmax:        133.00 cm/s AV Peak Grad:   7.1 mmHg LVOT Vmax:      81.20 cm/s LVOT Vmean:     55.000 cm/s LVOT VTI:       0.214 m  AORTA Ao Root diam: 3.10 cm Ao Asc diam:  2.90 cm MITRAL VALVE MV Area (PHT): 3.12 cm    SHUNTS MV Decel Time: 243 msec    Systemic VTI:  0.21 m MV  E velocity: 80.70 cm/s  Systemic Diam: 1.90 cm MV A velocity: 95.40 cm/s MV E/A ratio:  0.85 Arvilla Meres MD Electronically signed by Arvilla Meres MD Signature Date/Time: 05/06/2023/12:42:07 PM    Final    CT Angio Chest Pulmonary Embolism (PE) W or WO Contrast Addendum Date: 05/05/2023 ADDENDUM REPORT: 05/05/2023 20:50 ADDENDUM: These results were called by telephone at the time of interpretation on 05/05/2023 at 8:50 pm to provider nurse Onalee Hua, who verbally acknowledged these results. Electronically Signed   By: Darliss Cheney M.D.   On: 05/05/2023 20:50   Result Date: 05/05/2023 CLINICAL DATA:  High probability for PE. EXAM: CT ANGIOGRAPHY CHEST WITH CONTRAST TECHNIQUE: Multidetector CT imaging of the chest was performed using the standard protocol during bolus administration of intravenous contrast. Multiplanar CT image reconstructions and MIPs were obtained to evaluate the vascular anatomy. RADIATION DOSE REDUCTION: This exam was performed according to the departmental dose-optimization program which includes automated exposure control, adjustment of the mA and/or kV according to patient size and/or use of iterative reconstruction technique. CONTRAST:  75mL OMNIPAQUE IOHEXOL 350 MG/ML SOLN COMPARISON:  CT chest 12/15/2019 FINDINGS: Cardiovascular: There small segmental and subsegmental bilateral lower lobe pulmonary emboli. Heart and aorta are normal in  size. There is no pericardial effusion. Mediastinum/Nodes: Enteric tube is seen throughout the esophagus. There is an enlarged subcarinal lymph node measuring 12 mm short axis. There are enlarged left hilar lymph nodes measuring 12 mm short axis. There are enlarged right hilar lymph nodes measuring 12 mm short axis. The visualized thyroid gland is within normal limits. Lungs/Pleura: Endotracheal tube tip is 2.6 cm above the carina. Mild patchy multifocal ground-glass opacities are seen in the bilateral upper lobes. There is bilateral lower lobe airspace  consolidation, left greater than right. No pneumothorax. There is a trace left pleural effusion. Upper Abdomen: Enteric tube tip is in the body of the stomach. Musculoskeletal: Left shoulder arthroplasty is present. There is mild compression deformity of the superior endplate of T2 and T9. There is moderate compression deformity of T12. These all appear chronic. Thoracic spinal cord stimulator device is present. The bones are osteopenic. Review of the MIP images confirms the above findings. IMPRESSION: 1. Small segmental and subsegmental bilateral lower lobe pulmonary emboli. No right heart strain. 2. Bilateral lower lobe airspace consolidation, left greater than right, worrisome for pneumonia. 3. Mild patchy multifocal ground-glass opacities in the bilateral upper lobes, likely infectious/inflammatory. 4. Trace left pleural effusion. 5. Mediastinal and hilar lymphadenopathy, likely reactive. 6. Endotracheal tube tip is 2.6 cm above the carina. 7. Enteric tube tip is in the body of the stomach. 8. Chronic compression deformities of T2, T9, and T12. Electronically Signed: By: Darliss Cheney M.D. On: 05/05/2023 18:31   DG Chest Portable 1 View Result Date: 05/05/2023 CLINICAL DATA:  Endotracheal tube placement. EXAM: PORTABLE CHEST 1 VIEW COMPARISON:  05/04/2023 FINDINGS: Endotracheal tube tip is positioned at the carina, about 7 mm above the base of the carina and directed towards the right mainstem bronchus. The NG tube passes into the stomach although the distal tip position is not included on the film. There is diffuse interstitial opacity in both lungs with interval progression of diffuse airspace disease in the left lung compatible with asymmetric edema or infection. Thoracic spinal stimulator device evident. Telemetry leads overlie the chest. IMPRESSION: 1. Endotracheal tube tip is positioned about 7 mm above the base of the carina and directed towards the right mainstem bronchus. Recommend retraction by about  2 cm. 2. Interval progression of diffuse airspace disease in the left lung compatible with asymmetric edema or infection. These results will be called to the ordering clinician or representative by the Radiologist Assistant, and communication documented in the PACS or [employer]. Electronically Signed   By: Kennith Center M.D.   On: 05/05/2023 06:10   DG Abdomen 1 View Result Date: 05/05/2023 CLINICAL DATA:  OG tube placement. EXAM: ABDOMEN - 1 VIEW COMPARISON:  None Available. FINDINGS: OG tube tip is in the mid stomach with proximal side port at or just below the GE junction. Visualized upper abdomen demonstrates nonspecific bowel gas pattern. IMPRESSION: OG tube tip is in the mid stomach with proximal side port at or just below the GE junction. Tube could be advanced 2-3 cm to ensure side port position below the GE junction as warranted. Electronically Signed   By: Kennith Center M.D.   On: 05/05/2023 06:06   DG Chest Portable 1 View Result Date: 05/04/2023 CLINICAL DATA:  Respiratory distress EXAM: PORTABLE CHEST 1 VIEW COMPARISON:  12/20/2022 FINDINGS: Lung volumes are slightly small burr symmetric and stable since prior examination. No focal pulmonary infiltrate. No pneumothorax. Small left pleural effusion versus pleural thickening is unchanged. Progressive bilateral hilar  enlargement is present which may relate to progressive changes of pulmonary arterial hypertension hilar adenopathy. Cardiac size within normal limits. Dorsal column stimulator lead noted. IMPRESSION: 1. Progressive bilateral hilar enlargement which may relate to progressive changes of pulmonary arterial hypertension or hilar adenopathy. This could be confirmed with contrast enhanced CT imaging. 2. Stable small left pleural effusion versus pleural thickening. Electronically Signed   By: Helyn Numbers M.D.   On: 05/04/2023 22:13     The results of significant diagnostics from this hospitalization (including imaging, microbiology,  ancillary and laboratory) are listed below for reference.     Microbiology: Recent Results (from the past 240 hours)  Culture, blood (routine x 2)     Status: None   Collection Time: 05/04/23  9:37 PM   Specimen: BLOOD  Result Value Ref Range Status   Specimen Description BLOOD SITE NOT SPECIFIED  Final   Special Requests   Final    BOTTLES DRAWN AEROBIC AND ANAEROBIC Blood Culture results may not be optimal due to an inadequate volume of blood received in culture bottles   Culture   Final    NO GROWTH 5 DAYS Performed at [facility], [address] [place], [place] [address]    Report Status 05/09/2023 FINAL  Final  Resp panel by RT-PCR (RSV, Flu A&B, Covid) Anterior Nasal Swab     Status: None   Collection Time: 05/04/23  9:38 PM   Specimen: Anterior Nasal Swab  Result Value Ref Range Status   SARS Coronavirus 2 by RT PCR NEGATIVE NEGATIVE Final   Influenza A by PCR NEGATIVE NEGATIVE Final   Influenza B by PCR NEGATIVE NEGATIVE Final    Comment: (NOTE) The Xpert Xpress SARS-CoV-2/FLU/RSV plus assay is intended as an aid in the diagnosis of influenza from Nasopharyngeal swab specimens and should not be used as a sole basis for treatment. Nasal washings and aspirates are unacceptable for Xpert Xpress SARS-CoV-2/FLU/RSV testing.  Fact Sheet for Patients: BloggerCourse.com  Fact Sheet for Healthcare Providers: SeriousBroker.it  This test is not yet approved or cleared by the [place] FDA and has been authorized for detection and/or diagnosis of SARS-CoV-2 by FDA under an Emergency Use Authorization (EUA). This EUA will remain in effect (meaning this test can be used) for the duration of the COVID-19 declaration under Section 564(b)(1) of the Act, 21 U.S.C. section 360bbb-3(b)(1), unless the authorization is terminated or revoked.     Resp Syncytial Virus by PCR NEGATIVE NEGATIVE Final    Comment: (NOTE) Fact  Sheet for Patients: BloggerCourse.com  Fact Sheet for Healthcare Providers: SeriousBroker.it  This test is not yet approved or cleared by the [place] FDA and has been authorized for detection and/or diagnosis of SARS-CoV-2 by FDA under an Emergency Use Authorization (EUA). This EUA will remain in effect (meaning this test can be used) for the duration of the COVID-19 declaration under Section 564(b)(1) of the Act, 21 U.S.C. section 360bbb-3(b)(1), unless the authorization is terminated or revoked.  Performed at [facility], [address], [place], [place] [address]   Culture, blood (routine x 2)     Status: None   Collection Time: 05/04/23  9:42 PM   Specimen: BLOOD  Result Value Ref Range Status   Specimen Description BLOOD SITE NOT SPECIFIED  Final   Special Requests   Final    BOTTLES DRAWN AEROBIC AND ANAEROBIC Blood Culture adequate volume   Culture   Final    NO GROWTH 5 DAYS  Performed at [facility], [address] [place], [place] [address]    Report Status 05/09/2023 FINAL  Final  MRSA Next Gen by PCR, Nasal     Status: None   Collection Time: 05/05/23  8:25 AM   Specimen: Nasal Mucosa; Nasal Swab  Result Value Ref Range Status   MRSA by PCR Next Gen NOT DETECTED NOT DETECTED Final    Comment: (NOTE) The GeneXpert MRSA Assay (FDA approved for NASAL specimens only), is one component of a comprehensive MRSA colonization surveillance program. It is not intended to diagnose MRSA infection nor to guide or monitor treatment for MRSA infections. Test performance is not FDA approved in patients less than 42 years old. Performed at [facility], [address] [place], [place] [address]   Culture, Respiratory w Gram Stain     Status: None   Collection Time: 05/06/23 10:07 AM   Specimen: Tracheal Aspirate; Respiratory  Result Value Ref Range Status   Specimen Description TRACHEAL ASPIRATE   Final   Special Requests NONE  Final   Gram Stain   Final    ABUNDANT WBC PRESENT, PREDOMINANTLY PMN NO ORGANISMS SEEN    Culture   Final    RARE Normal respiratory flora-no Staph aureus or Pseudomonas seen Performed at [facility], [address], [place], [place] [address]    Report Status 05/08/2023 FINAL  Final     Labs: BNP (last 3 results) Recent Labs    05/04/23 2137  BNP 204.4*   Basic Metabolic Panel: Recent Labs  Lab 05/06/23 0020 05/06/23 1049 05/06/23 1049 05/06/23 2042 05/07/23 0547 05/07/23 1709 05/08/23 0311 05/10/23 0752 05/11/23 0646  NA 138  --   --   --  138  --  138 139 139  K 3.5  --   --   --  3.5  --  4.3 3.9 4.3  CL 102  --   --   --  107  --  105 103 103  CO2 23  --   --   --  21*  --  25 28 29   GLUCOSE 148*  --   --   --  136*  --  73 93 84  BUN 26*  --   --   --  48*  --  56* 30* 27*  CREATININE 1.22*  --   --   --  1.10*  --  1.27* 1.06* 1.35*  CALCIUM 8.7*  --   --   --  8.2*  --  8.8* 9.1 9.1  MG  --  1.7   < > 1.8 3.1* 3.1*  --  2.2 2.0  PHOS  --  3.4  --  2.7 3.1 6.1*  --  3.3  --    < > = values in this interval not displayed.   Liver Function Tests: Recent Labs  Lab 05/04/23 2137  AST 37  ALT 28  ALKPHOS 40  BILITOT 0.9  PROT 6.5  ALBUMIN 3.2*   No results for input(s): "LIPASE", "AMYLASE" in the last 168 hours. Recent Labs  Lab 05/04/23 2138  AMMONIA 35   CBC: Recent Labs  Lab 05/04/23 2137 05/04/23 2146 05/06/23 0020 05/07/23 0547 05/08/23 0311 05/10/23 0752 05/11/23 0646  WBC 7.2   < > 6.8 6.3 10.1 8.2 7.0  NEUTROABS 2.6  --   --   --   --   --   --   HGB 12.7   < >  13.2 12.4 12.8 11.7* 13.0  HCT 42.3   < > 42.4 38.6 42.0 37.9 41.6  MCV 104.4*   < > 99.8 96.5 101.9* 100.3* 99.5  PLT 190   < > 162 179 212 215 219   < > = values in this interval not displayed.   Cardiac Enzymes: No results for input(s): "CKTOTAL", "CKMB", "CKMBINDEX", "TROPONINI" in the last 168 hours. BNP: Invalid input(s):  "POCBNP" CBG: Recent Labs  Lab 05/10/23 1757 05/10/23 2048 05/11/23 0004 05/11/23 0400 05/11/23 0852  GLUCAP 94 93 87 85 79   D-Dimer No results for input(s): "DDIMER" in the last 72 hours. Hgb A1c No results for input(s): "HGBA1C" in the last 72 hours. Lipid Profile No results for input(s): "CHOL", "HDL", "LDLCALC", "TRIG", "CHOLHDL", "LDLDIRECT" in the last 72 hours. Thyroid function studies No results for input(s): "TSH", "T4TOTAL", "T3FREE", "THYROIDAB" in the last 72 hours.  Invalid input(s): "FREET3" Anemia work up No results for input(s): "VITAMINB12", "FOLATE", "FERRITIN", "TIBC", "IRON", "RETICCTPCT" in the last 72 hours. Urinalysis    Component Value Date/Time   COLORURINE YELLOW 05/10/2023 0233   APPEARANCEUR CLEAR 05/10/2023 0233   LABSPEC 1.014 05/10/2023 0233   PHURINE 6.0 05/10/2023 0233   GLUCOSEU NEGATIVE 05/10/2023 0233   HGBUR NEGATIVE 05/10/2023 0233   BILIRUBINUR NEGATIVE 05/10/2023 0233   BILIRUBINUR 1 mg/dL 16/11/9602 5409   KETONESUR NEGATIVE 05/10/2023 0233   PROTEINUR NEGATIVE 05/10/2023 0233   UROBILINOGEN 0.2 10/19/2022 1524   NITRITE NEGATIVE 05/10/2023 0233   LEUKOCYTESUR NEGATIVE 05/10/2023 0233   Sepsis Labs Recent Labs  Lab 05/07/23 0547 05/08/23 0311 05/10/23 0752 05/11/23 0646  WBC 6.3 10.1 8.2 7.0   Microbiology Recent Results (from the past 240 hours)  Culture, blood (routine x 2)     Status: None   Collection Time: 05/04/23  9:37 PM   Specimen: BLOOD  Result Value Ref Range Status   Specimen Description BLOOD SITE NOT SPECIFIED  Final   Special Requests   Final    BOTTLES DRAWN AEROBIC AND ANAEROBIC Blood Culture results may not be optimal due to an inadequate volume of blood received in culture bottles   Culture   Final    NO GROWTH 5 DAYS Performed at [facility], [address], [place], [place] [address]    Report Status 05/09/2023 FINAL  Final  Resp panel by RT-PCR (RSV, Flu A&B, Covid) Anterior Nasal  Swab     Status: None   Collection Time: 05/04/23  9:38 PM   Specimen: Anterior Nasal Swab  Result Value Ref Range Status   SARS Coronavirus 2 by RT PCR NEGATIVE NEGATIVE Final   Influenza A by PCR NEGATIVE NEGATIVE Final   Influenza B by PCR NEGATIVE NEGATIVE Final    Comment: (NOTE) The Xpert Xpress SARS-CoV-2/FLU/RSV plus assay is intended as an aid in the diagnosis of influenza from Nasopharyngeal swab specimens and should not be used as a sole basis for treatment. Nasal washings and aspirates are unacceptable for Xpert Xpress SARS-CoV-2/FLU/RSV testing.  Fact Sheet for Patients: BloggerCourse.com  Fact Sheet for Healthcare Providers: SeriousBroker.it  This test is not yet approved or cleared by the [place] FDA and has been authorized for detection and/or diagnosis of SARS-CoV-2 by FDA under an Emergency Use Authorization (EUA). This EUA will remain in effect (meaning this test can be used) for the duration of the COVID-19 declaration under Section 564(b)(1) of the Act, 21 U.S.C. section 360bbb-3(b)(1), unless the authorization is terminated or revoked.  Resp Syncytial Virus by PCR NEGATIVE NEGATIVE Final    Comment: (NOTE) Fact Sheet for Patients: BloggerCourse.com  Fact Sheet for Healthcare Providers: SeriousBroker.it  This test is not yet approved or cleared by the [place] FDA and has been authorized for detection and/or diagnosis of SARS-CoV-2 by FDA under an Emergency Use Authorization (EUA). This EUA will remain in effect (meaning this test can be used) for the duration of the COVID-19 declaration under Section 564(b)(1) of the Act, 21 U.S.C. section 360bbb-3(b)(1), unless the authorization is terminated or revoked.  Performed at [facility], [address] [place], [place] [address]   Culture, blood (routine x 2)     Status: None    Collection Time: 05/04/23  9:42 PM   Specimen: BLOOD  Result Value Ref Range Status   Specimen Description BLOOD SITE NOT SPECIFIED  Final   Special Requests   Final    BOTTLES DRAWN AEROBIC AND ANAEROBIC Blood Culture adequate volume   Culture   Final    NO GROWTH 5 DAYS Performed at [facility], [address], [place], [place] [address]    Report Status 05/09/2023 FINAL  Final  MRSA Next Gen by PCR, Nasal     Status: None   Collection Time: 05/05/23  8:25 AM   Specimen: Nasal Mucosa; Nasal Swab  Result Value Ref Range Status   MRSA by PCR Next Gen NOT DETECTED NOT DETECTED Final    Comment: (NOTE) The GeneXpert MRSA Assay (FDA approved for NASAL specimens only), is one component of a comprehensive MRSA colonization surveillance program. It is not intended to diagnose MRSA infection nor to guide or monitor treatment for MRSA infections. Test performance is not FDA approved in patients less than 22 years old. Performed at [facility], [address] [place], [place] [address]   Culture, Respiratory w Gram Stain     Status: None   Collection Time: 05/06/23 10:07 AM   Specimen: Tracheal Aspirate; Respiratory  Result Value Ref Range Status   Specimen Description TRACHEAL ASPIRATE  Final   Special Requests NONE  Final   Gram Stain   Final    ABUNDANT WBC PRESENT, PREDOMINANTLY PMN NO ORGANISMS SEEN    Culture   Final    RARE Normal respiratory flora-no Staph aureus or Pseudomonas seen Performed at [facility], [address], [place], [place] [address]    Report Status 05/08/2023 FINAL  Final     Time coordinating discharge:  I have spent 35 minutes face to face with the patient and on the ward discussing the patients care, assessment, plan and disposition with other care givers. >50% of the time was devoted counseling the patient about the risks and benefits of treatment/Discharge disposition and coordinating care.   SIGNED:   Miguel Rota,  MD  [facility] 05/11/2023, 9:07 AM   If 7PM-7AM, please contact night-coverage

## 2023-05-11 NOTE — TOC Transition Note (Signed)
 Transition of Care [facility]) - Discharge Note   Patient Details  Name: Lori Orozco MRN: [id number] Date of Birth: 1950-11-22  [facility] CM/SW Contact:  Deatra Robinson, LCSW Phone Number: 05/11/2023, 11:48 AM   Clinical Narrative:  Pt for dc to [facility] today. Spoke to Darrian in admissions who confirmed they are prepared to admit pt to room 104. Pt's sister Pam aware of dc and reports agreeable. RN provided with number for report and PTAR arranged for transport. SW signing off at dc.     Dellie Burns, MSW, LCSW [phone] (coverage)       Final next level of care: Skilled Nursing Facility Barriers to Discharge: Barriers Resolved   Patient Goals and CMS Choice Patient states their goals for this hospitalization and ongoing recovery are:: get better          Discharge Placement              Patient chooses bed at: [facility] Patient to be transferred to facility by: PTAR Name of family member notified: Pam/Sister Patient and family notified of of transfer: 05/11/23  Discharge Plan and Services Additional resources added to the After Visit Summary for                                       Social Drivers of Health (SDOH) Interventions SDOH Screenings   Food Insecurity: Patient Unable To Answer (05/05/2023)  Recent Concern: Food Insecurity - Food Insecurity Present (02/04/2023)  Housing: Patient Unable To Answer (05/05/2023)  Transportation Needs: Patient Unable To Answer (05/05/2023)  Utilities: Patient Unable To Answer (05/05/2023)  Alcohol Screen: Low Risk  (08/23/2022)  Depression (PHQ2-9): High Risk (02/08/2023)  Financial Resource Strain: Medium Risk (02/04/2023)  Physical Activity: Inactive (02/04/2023)  Social Connections: Patient Unable To Answer (05/05/2023)  Stress: Stress Concern Present (02/04/2023)  Tobacco Use: Medium Risk (05/05/2023)     Readmission Risk Interventions     No data to display

## 2023-05-11 NOTE — Plan of Care (Signed)
 Patient AAOx4. Scattered bruising, skin tear to left ring finger knuckle, mepilex in place.  No complaints of pain. LBM today. Safety precautions maintained.  1318: Report given to [place] at [place].  1450: Ambulance transport arrived. Paperwork and patient belongings handed off. PIV removed. Patient left in no acute or respiratory distress.   Problem: Health Behavior/Discharge Planning: Goal: Ability to manage health-related needs will improve Outcome: Progressing   Problem: Skin Integrity: Goal: Risk for impaired skin integrity will decrease Outcome: Progressing   Problem: Education: Goal: Knowledge of General Education information will improve Description: Including pain rating scale, medication(s)/side effects and non-pharmacologic comfort measures Outcome: Progressing   Problem: Activity: Goal: Risk for activity intolerance will decrease Outcome: Progressing

## 2023-05-14 ENCOUNTER — Other Ambulatory Visit: Payer: Self-pay | Admitting: *Deleted

## 2023-05-14 NOTE — Patient Outreach (Signed)
 Per [facility], Ms. Chestang resides in [place] [facility] and [place]. She has been active with [facility] complex care management team as benefit of health plan and PCP.   Secure message sent to [facility] social worker to make aware writer is following.   Will update [facility] RN CM as information is received.   Will continue to follow while Ms. Mooty resides in SNF.   Raiford Noble, MSN, RN, BSN [place]  [facility], [facility] RN Post- Acute Care Manager Direct Dial: [phone]

## 2023-05-14 NOTE — Telephone Encounter (Signed)
 I wish you the best recovering and getting the help you need.   Assisted living is a great idea  So sorry you have been through so much!

## 2023-05-15 NOTE — Progress Notes (Deleted)
 Office Visit Note  Patient: Lori Orozco             Date of Birth: [birth year]           MRN: [id number]             PCP: [name], MD Referring: Tower, Audrie Gallus, MD Visit Date: 05/28/2023   Subjective:  No chief complaint on file.   History of Present Illness: Lori Orozco is a 73 y.o. 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    Previous HPI 12/24/2022 Lori Orozco is a 73 y.o. 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  She also started on Fosamax 70 mg once weekly for osteoporosis without any trouble taking the medication.  She had 1 episode of increased stiffness and swelling throughout proximal joints in her right hand this lasted for about 3 days.  She did not change her medications or take anything different to resolve this.  Saw Dr. Frazier Butt to discuss options to improve use of her right hand is waiting to hear back on recommendation or other options.  She just had a fall fell forward in the bathroom striking on her left shoulder and left side of the neck and base of skull.  Before this her last major fall was late last year has had about 2 or 3 other falls in total with suddenly losing balance.   Previous HPI 09/10/22 Lori Orozco is a 73 y.o. female here for follow up for seronegative RA on Enbrel 50 mg [place] weekly and prednisone 10 mg daily. She has significant pain in her back and legs interval workup indicating nerve impingement and is scheduled for injections with Dr. Alvester Morin for this. MRI also with vertebral fractures probably chronic. In her arms right wrist remains painful and decreased mobility worst on the ulnar side.   Previous HPI 05/30/22 Lori Orozco is a 73 y.o. female here for follow up for seronegative RA on Enbrel 50 mg subcu weekly sulfasalazine 1000 mg twice daily and prednisone 20 mg daily.  She was taking  the medications without interruptions but started experiencing increased joint pain swelling and stiffness especially affecting her hands wrists and elbows since about 2 weeks ago.  She called about this and we increased her to 20 mg prednisone daily up from 10 mg and she has noticed improvement in symptoms especially her elbows.  No longer swollen and is able to tolerate resting her arms on a flat surface without severe pain.  She did suffer from upper respiratory symptoms last week but these have mostly resolved without any new medication needed.    Previous HPI 02/28/22 Lori Orozco is a 73 y.o. female here for follow up for seronegative rheumatoid arthritis on Enbrel 50 mg subcu weekly and prednisone 10 mg daily.  She had to stop taking the sulfasalazine since about 2 weeks ago due to running out of the medication.  She had change with insurance status so having a dramatically increased cost for her medications at the moment that she is having to sort out.  She was tolerating the medicine okay.  She did not recall a noticeable difference in symptoms with starting the sulfasalazine but while off the medication has felt noticeably worse so thinks it was doing something.  Particularly the left wrist pain has been a lot  better on treatment her right wrist continues to be bad before and after.  She saw Dr. Wynetta Emery for her neck recommendation was for physical therapy but she has not pursued this since and does not feel like she would tolerate this well based on previous therapy experiences.  She does have plans for trying the nerve stimulator treatment starting later this month.      Previous HPI 12/21/21 Lori Orozco is a 73 y.o. 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  She continues to have pain and stiffness affecting her hands bilaterally although the severity of swelling has gotten notably better on the Enbrel treatment but is far from  resolved.  Her biggest concern at the moment is about her neck pain concern of cervical radiculopathy.  We checked x-ray that showed considerable multilevel degenerative disease with no acute appearing bony abnormality.  She is followed up with her spine doctor with epidural block injection tried did not find it very beneficial at all.  She has discussed radiofrequency ablation as a neck step option for radicular pain.     Previous HPI 10/09/2021 Lori Orozco is a 73 y.o. female here for follow up for seronegative inflammatory arthritis on Enbrel 50 mg [place] weekly and prednisone 10 mg daily. She tried tapering prednisone but had severe worsening symptoms at 5 mg dose. She increased back to 10 mg daily and symptoms improved somewhat but still a lot of pain and swelling in both hands and wrists. She has ongoing back pain and stiffness and is seeing D.r Wynetta Emery for this with known significant lumbar spine arthritis. More recently new problem with sensitivity and stinging type pain throughout both arms with even light pressure such as showering water. She also has worsening weakness in her proximal legs unable to stand without using arms, which are limited by pain.     Previous HPI 07/05/2021 Lori Orozco is a 73 y.o. female here for follow up for seronegative inflammatory arthritis on prednisone 20 mg daily dose and enbrel 50mg  subcutaneous once weekly. She has seen a large improvement in joint inflammation since starting the Enbrel. She has pain and redness around the injection site lasting up to 3 days with each dose. Wrists continue to hurt and have decreased movement left wrist actually more symptomatic than right now. She is working with physical therapy on getting back to walking currently very weak and unstable due to illness and deconditioning. She had recent ED visit for COPD exacerbation finished antibiotics for this. No other recent infection issues.   Previous HPI 02/08/2021 Lori Orozco is a 72 y.o. 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  She has had major medical events since her last visit.  She developed increased knee pain and swelling on the right side.  However she went to the hospital due to developing more weakness and shortness of breath CT angiogram identified bilateral pulmonary emboli.  Treatment with anticoagulation led to significant gastrointestinal bleeding from previously unknown AVM.  This was treated also in the hospital but overall protracted complications lasted for about a month hospitalization.  During this time she experienced worsening joint pain and swelling and significant deconditioning and weakness.  She was discharged from the hospital to rehab facility she was not continued on any steroid or other anti-inflammatory treatment reports joint pain and swelling affecting numerous joints in bilateral elbows wrists fingers and  knees.  This is limited any significant progress with physical therapy so far.  She called back to clinic earlier this month with the symptoms and was restarted on a prednisone taper currently down to 15 mg daily.  She saw orthopedics clinic for right knee aspiration injection 2 days ago that is partially helping.   Previous HPI 11/15/20 Lori Orozco is a 73 y.o. female here for follow up with wrist wrist extensor tenosynovitis and presumed seronegative inflammatory arthritis also involving knee joints after starting methotrexate 15 mg PO weekly and tapering prednisone off. Her wrist is slightly improved but remains very painful and swollen with decreased mobility. After starting methotrexate she has noticed some episodes of dizziness and urinary retention and frequency. She stopped the prednisone without noticing much difference in symptoms so far.     09/20/20 Lori Orozco is a 74 y.o. female here for joint pain and swelling of knees and wrists with  severe tenosynovitis s/p tenosynovectomy on 09/01/20 with Dr. [name].  She had been feeling in her usual health until June she recalls onset of symptoms after using a gas leaf blower at her home during which time she experienced some mild right wrist pain.  She does not recall any particular injury event at this time.  However she experienced progressive ongoing increase in pain with swelling and stiffness in her right wrist especially with decreased range of motion and pain over the dorsal aspect.  This is evaluated at orthopedics clinic with aspiration that revealed just coagulated sample trial of steroid medication and only partially improved symptoms then continued worsening.  She was admitted to the hospital for tenosynovectomy that was performed without complication.  Intraoperatively reported extensive inflammatory tissue debridement without any evidence concerning for infection.  During this hospitalization she also developed knee effusion which was aspirated demonstrating inflammatory synovial fluid with negative microscopy and cultures.  Is now almost 3 weeks since her surgery she continues experiencing severe pain intensity swelling around the right hand and wrist and has developed some skin blistering.     Labs reviewed 08/2020 Synovial fluid knee 4,525 WBCs 90% neutrophils ANA neg RF 14.2 CCP neg ESR 37 CRP 14.1 Uric acid 4.9   07/2020 Synovial fluid wrist clotted sample negative gram stain   No Rheumatology ROS completed.   PMFS History:  Patient Active Problem List   Diagnosis Date Noted   Hypertensive urgency 05/05/2023   CKD stage 3b, GFR 30-44 ml/min (HCC) 05/05/2023   COPD with acute exacerbation (HCC) 05/05/2023   Elevated troponin 05/05/2023   Acute encephalopathy 05/05/2023   Depression with anxiety 05/05/2023   Respiratory failure (HCC) 05/05/2023   Acute respiratory failure with hypoxia and hypercapnia (HCC) 05/04/2023   Cerumen impaction 03/06/2023   Lesion of  palate 03/06/2023   Mobility impaired 02/10/2023   Generalized weakness 02/08/2023   Recurrent falls 12/24/2022   Pre-operative clearance 10/30/2022   Diarrhea 10/19/2022   Cold intolerance 10/19/2022   Osteoporosis 09/10/2022   Laceration of forehead 08/29/2022   Thoracic compression fracture (HCC) 07/13/2022   Diabetes mellitus screening 07/13/2022   Lumbar herniated disc    Chronic pain 11/28/2021   Bilateral leg weakness 10/09/2021   Vitamin B12 deficiency 08/02/2021   Myofascial pain dysfunction syndrome 08/02/2021   Adverse effect of prednisone 07/11/2021   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   History of pulmonary embolism 12/08/2020   Arthritis of knee 12/06/2020   Bilateral knee pain 09/20/2020   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   High  risk medication use 09/20/2020   Extensor tenosynovitis of right wrist 09/15/2020   S/P reverse total shoulder arthroplasty, 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   Lung nodule 01/02/2020   Status post arthroscopy of left shoulder 01/16/2018   Estrogen deficiency 06/28/2017   Medial epicondylitis, left elbow 04/29/2017   S/P arthroscopy of left shoulder 02/25/2017   Impingement syndrome of left shoulder 01/30/2017   Trochanteric bursitis, right hip 04/10/2016   Hypertension 09/02/2015   Urge incontinence 07/27/2015   Obesity (BMI 30-39.9) 04/28/2015   Osteoarthritis of left knee 01/28/2015   Status post total left knee replacement 01/28/2015   Vitamin D deficiency 04/27/2014   Seasonal and perennial allergic rhinitis 06/17/2013   S/P total hip arthroplasty 03/20/2013   Medicare annual wellness visit, subsequent 11/18/2012   At high risk for falls 11/18/2012   Routine general medical examination at a health care facility 11/10/2012   Lichen sclerosus et atrophicus of the vulva 07/29/2012   Osteopenia 10/19/2011   Hypothyroidism 10/19/2011   Hereditary and idiopathic peripheral neuropathy 08/05/2009   Low back pain 08/05/2009    HAND PAIN, BILATERAL 08/05/2009   TREMOR 03/09/2008   MENOPAUSAL SYNDROME 10/09/2007   DEPRESSION, MAJOR 05/20/2007   BIPOLAR AFFECTIVE DISORDER 05/20/2007   Generalized anxiety disorder 05/20/2007   PERSONALITY DISORDER 05/20/2007   ESOPHAGEAL SPASM 05/20/2007   Diaphragmatic hernia 05/20/2007   COPD mixed type (HCC) 03/27/2007   HYPERCHOLESTEROLEMIA, PURE 12/10/2006   SYMPTOM, SYNDROME, CHRONIC FATIGUE 12/10/2006    Past Medical History:  Diagnosis Date   Allergy Hayfever   1958   Amaurosis fugax    Anemia    hx   Anxiety    Arthritis    Asthma    Cataract    Chronic fatigue syndrome    Chronic kidney disease    frequency, nephritis when 73 yrs old   COPD (chronic obstructive pulmonary disease) (HCC)    COVID-19    Depression    Diverticulitis    Emphysema of lung (HCC)    Fibromyalgia    GERD (gastroesophageal reflux disease)    occ   H/O hiatal hernia    History of bronchitis    Hyperlipidemia    Hypothyroidism    Interstitial cystitis    Irritable bowel syndrome    Lichen sclerosus    Lumbar herniated disc    Migraines    Motor nerve conduction block 11/2021   Osteoporosis 09/10/2022   Pneumonia    PONV (postoperative nausea and vomiting)    Shortness of breath    exertion    Thyroid disease    Gra[place]   Urinary frequency    Urinary tract infection    Vertigo     Family History  Problem Relation Age of Onset   Arthritis Mother    Hypertension Mother    Kidney disease Mother    Heart disease Mother    Stroke Mother    Cancer Father        Bladder   Arthritis Sister    Heart disease Sister    Colon cancer Other    Cancer - Lung Cousin    Arthritis Maternal Grandmother    Hearing loss Maternal Grandfather    Varicose Veins Maternal Uncle    Past Surgical History:  Procedure Laterality Date   ABDOMINAL HYSTERECTOMY     bladder sugery      CAROTID DOPPLAR     COLONOSCOPY  05/26/2010   avms- otherwise nl , re check 10y   DEXA-OSTEOPENIA  DOPPLER ECHOCARDIOGRAPHY     elbow surgery     EPICONDYLITIS     EYE SURGERY Bilateral    cataracts   FOOT SURGERY Bilateral    I & D EXTREMITY Right 09/01/2020   Procedure: TYNOSYNOVECTOMY;  Surgeon: [name], MD;  Location[facility] OR;  Service: Orthopedics;  Laterality: Right;   IMPLANTATION VAGAL NERVE STIMULATOR  03/2022   JOINT REPLACEMENT     KNEE SURGERY     Left cartilage   lipoma in second finger right hand     MOUTH SURGERY  08/22/2022   PLANTAR FASCIA SURGERY Left    REVERSE SHOULDER ARTHROPLASTY Left 02/02/2020   Procedure: LEFT REVERSE SHOULDER ARTHROPLASTY;  Surgeon: [name], MD;  Location: [facility] OR;  Service: Orthopedics;  Laterality: Left;   ROTATOR CUFF REPAIR Left 12/2017   TEMPOROMANDIBULAR JOINT SURGERY     TONSILLECTOMY     TOTAL HIP ARTHROPLASTY Right 03/20/2013   Procedure: Right TOTAL HIP ARTHROPLASTY;  Surgeon: [name], MD;  Location: [facility] OR;  Service: Orthopedics;  Laterality: Right;  Right Total Hip Arthroplasty   TOTAL KNEE ARTHROPLASTY Left 01/28/2015   Procedure: LEFT TOTAL KNEE ARTHROPLASTY;  Surgeon: [name], MD;  Location: [facility] ORS;  Service: Orthopedics;  Laterality: Left;   TRIGGER FINGER RELEASE Right    TROCHANTERIC BURSA EXCISION Right 05/03/2016   Dr. Doneen Poisson   WRIST ARTHROSCOPY Right    ligament tear   Social History   Social History Narrative   Not on file   Immunization History  Administered Date(s) Administered   Influenza Split 11/19/2011, 11/02/2013   Influenza Whole 12/20/2006, 11/25/2008, 11/26/2009, 11/24/2010   Influenza, High Dose Seasonal PF 12/13/2016, 10/30/2022   Influenza,inj,Quad PF,6+ Mos 12/06/2015, 11/28/2021   Influenza-Unspecified 12/08/2012, 12/03/2014, 12/08/2017, 11/10/2018, 10/31/2019   Moderna Sars-Covid-2 Vaccination 06/21/2019, 07/19/2019, 01/22/2020   PFIZER(Purple Top)SARS-COV-2 Vaccination 06/04/2020, 10/30/2022   Pfizer Covid-19 Vaccine Bivalent  Booster 46yrs & up 12/29/2021   Pneumococcal Conjugate-13 12/06/2015   Pneumococcal Polysaccharide-23 01/26/2002, 03/23/2008, 09/07/2010, 06/28/2017   Respiratory Syncytial Virus Vaccine,Recomb Aduvanted(Arexvy) 12/29/2021   Td 06/26/2000, 10/19/2011, 02/14/2022   Zoster Recombinant(Shingrix) 09/16/2017, 08/11/2022     Objective: Vital Signs: There were no vitals taken for this visit.   Physical Exam   Musculoskeletal Exam: ***  CDAI Exam: CDAI Score: -- Patient Global: --; Provider Global: -- Swollen: --; Tender: -- Joint Exam 05/28/2023   No joint exam has been documented for this visit   There is currently no information documented on the homunculus. Go to the Rheumatology activity and complete the homunculus joint exam.  Investigation: No additional findings.  Imaging: VAS [place] LOWER EXTREMITY VENOUS (DVT) Result Date: 05/06/2023  Lower Venous DVT Study Patient Name:  QUYNN VILCHIS  Date of Exam:   05/06/2023 Medical Rec #: [id number]            Accession #:    [id number] Date of Birth: [birth year]            Patient Gender: F Patient Age:   30 years Exam Location:  [facility] Procedure:      VAS [place] LOWER EXTREMITY VENOUS (DVT) Referring Phys: Melody Comas --------------------------------------------------------------------------------  Indications: Pulmonary embolism.  Risk Factors: Confirmed PE. Anticoagulation: Heparin. Limitations: Poor ultrasound/tissue interface, body habitus and patient positioning, patient immobility. Comparison Study: No prior studies. Performing Technologist: Chanda Busing RVT  Examination Guidelines: A complete evaluation includes B-mode imaging, spectral Doppler, color Doppler, and power Doppler as needed of all accessible portions  of each vessel. Bilateral testing is considered an integral part of a complete examination. Limited examinations for reoccurring indications may be performed as noted. The reflux portion of the exam is performed  with the patient in reverse Trendelenburg.  +---------+---------------+---------+-----------+----------+--------------+ RIGHT    CompressibilityPhasicitySpontaneityPropertiesThrombus Aging +---------+---------------+---------+-----------+----------+--------------+ CFV      Full           Yes      Yes                                 +---------+---------------+---------+-----------+----------+--------------+ SFJ      Full                                                        +---------+---------------+---------+-----------+----------+--------------+ FV Prox  Full                                                        +---------+---------------+---------+-----------+----------+--------------+ FV Mid   Full                                                        +---------+---------------+---------+-----------+----------+--------------+ FV Distal               Yes      Yes                                 +---------+---------------+---------+-----------+----------+--------------+ PFV      Full                                                        +---------+---------------+---------+-----------+----------+--------------+ POP      Full           Yes      Yes                                 +---------+---------------+---------+-----------+----------+--------------+ PTV      Full                                                        +---------+---------------+---------+-----------+----------+--------------+ PERO     Full                                                        +---------+---------------+---------+-----------+----------+--------------+   +---------+---------------+---------+-----------+----------+-------------------+ LEFT     CompressibilityPhasicitySpontaneityPropertiesThrombus Aging      +---------+---------------+---------+-----------+----------+-------------------+  CFV      Full           Yes      Yes                                       +---------+---------------+---------+-----------+----------+-------------------+ SFJ      Full                                                             +---------+---------------+---------+-----------+----------+-------------------+ FV Prox  Full                                                             +---------+---------------+---------+-----------+----------+-------------------+ FV Mid   Full                                                             +---------+---------------+---------+-----------+----------+-------------------+ FV Distal               Yes      Yes                                      +---------+---------------+---------+-----------+----------+-------------------+ PFV      Full                                                             +---------+---------------+---------+-----------+----------+-------------------+ POP      Full           Yes      Yes                                      +---------+---------------+---------+-----------+----------+-------------------+ PTV      Full                                                             +---------+---------------+---------+-----------+----------+-------------------+ PERO                                                  Not well visualized +---------+---------------+---------+-----------+----------+-------------------+     Summary: RIGHT: - There is no evidence of deep vein thrombosis in the lower extremity. However, portions of this examination were  limited- see technologist comments above.  - No cystic structure found in the popliteal fossa.  LEFT: - There is no evidence of deep vein thrombosis in the lower extremity. However, portions of this examination were limited- see technologist comments above.  - No cystic structure found in the popliteal fossa.  *See table(s) above for measurements and observations. Electronically signed by Sherald Hess MD on  05/06/2023 at 3:55:20 PM.    Final    ECHOCARDIOGRAM COMPLETE Result Date: 05/06/2023    ECHOCARDIOGRAM REPORT   Patient Name:   KEYOSHA TIEDT Date of Exam: 05/06/2023 Medical Rec #:  [id number]           Height:       67.0 in Accession #:    [id number]          Weight:       216.9 lb Date of Birth:  04-Jan-1951           BSA:          2.093 m Patient Age:    72 years            BP:           166/72 mmHg Patient Gender: F                   HR:           106 bpm. Exam Location:  Inpatient Procedure: 2D Echo, Cardiac Doppler and Color Doppler (Both Spectral and Color            Flow Doppler were utilized during procedure). Indications:    Cardiomyopathy-Unspecified  History:        Patient has no prior history of Echocardiogram examinations.                 COPD and CKD, stage 3; Risk Factors:Hypertension and Diabetes.  Sonographer:    Lucendia Herrlich RCS Referring Phys: [id number] ADITYA PALIWAL IMPRESSIONS  1. Left ventricular ejection fraction, by estimation, is 60 to 65%. The left ventricle has normal function. The left ventricle has no regional wall motion abnormalities. There is mild concentric left ventricular hypertrophy. Left ventricular diastolic parameters are consistent with Grade I diastolic dysfunction (impaired relaxation).  2. Right ventricular systolic function is normal. The right ventricular size is normal.  3. The mitral valve is normal in structure. Trivial mitral valve regurgitation. No evidence of mitral stenosis.  4. The aortic valve is tricuspid. There is mild calcification of the aortic valve. Aortic valve regurgitation is not visualized. Aortic valve sclerosis/calcification is present, without any evidence of aortic stenosis.  5. The inferior vena cava is normal in size with greater than 50% respiratory variability, suggesting right atrial pressure of 3 mmHg. FINDINGS  Left Ventricle: Left ventricular ejection fraction, by estimation, is 60 to 65%. The left ventricle has normal function.  The left ventricle has no regional wall motion abnormalities. The left ventricular internal cavity size was normal in size. There is  mild concentric left ventricular hypertrophy. Left ventricular diastolic parameters are consistent with Grade I diastolic dysfunction (impaired relaxation). Right Ventricle: The right ventricular size is normal. No increase in right ventricular wall thickness. Right ventricular systolic function is normal. Left Atrium: Left atrial size was normal in size. Right Atrium: Right atrial size was normal in size. Pericardium: There is no evidence of pericardial effusion. Mitral Valve: The mitral valve is normal in structure. Mild mitral annular calcification. Trivial mitral valve regurgitation. No evidence of mitral valve stenosis.  Tricuspid Valve: The tricuspid valve is normal in structure. Tricuspid valve regurgitation is trivial. No evidence of tricuspid stenosis. Aortic Valve: The aortic valve is tricuspid. There is mild calcification of the aortic valve. Aortic valve regurgitation is not visualized. Aortic valve sclerosis/calcification is present, without any evidence of aortic stenosis. Aortic valve peak gradient measures 7.1 mmHg. Pulmonic Valve: The pulmonic valve was normal in structure. Pulmonic valve regurgitation is not visualized. No evidence of pulmonic stenosis. Aorta: The aortic root is normal in size and structure. Venous: The inferior vena cava is normal in size with greater than 50% respiratory variability, suggesting right atrial pressure of 3 mmHg. IAS/Shunts: No atrial level shunt detected by color flow Doppler.  LEFT VENTRICLE PLAX 2D LVIDd:         4.70 cm   Diastology LVIDs:         3.10 cm   LV e' medial:    5.55 cm/s LV PW:         0.90 cm   LV E/e' medial:  14.5 LV IVS:        0.60 cm   LV e' lateral:   7.07 cm/s LVOT diam:     1.90 cm   LV E/e' lateral: 11.4 LV SV:         61 LV SV Index:   29 LVOT Area:     2.84 cm  RIGHT VENTRICLE             IVC RV S prime:      12.50 cm/s  IVC diam: 1.90 cm TAPSE (M-mode): 2.1 cm LEFT ATRIUM             Index        RIGHT ATRIUM           Index LA diam:        4.20 cm 2.01 cm/m   RA Area:     16.70 cm LA Vol (A2C):   56.9 ml 27.18 ml/m  RA Volume:   41.00 ml  19.59 ml/m LA Vol (A4C):   55.7 ml 26.61 ml/m LA Biplane Vol: 59.6 ml 28.47 ml/m  AORTIC VALVE AV Area (Vmax): 1.73 cm AV Vmax:        133.00 cm/s AV Peak Grad:   7.1 mmHg LVOT Vmax:      81.20 cm/s LVOT Vmean:     55.000 cm/s LVOT VTI:       0.214 m  AORTA Ao Root diam: 3.10 cm Ao Asc diam:  2.90 cm MITRAL VALVE MV Area (PHT): 3.12 cm    SHUNTS MV Decel Time: 243 msec    Systemic VTI:  0.21 m MV E velocity: 80.70 cm/s  Systemic Diam: 1.90 cm MV A velocity: 95.40 cm/s MV E/A ratio:  0.85 Arvilla Meres MD Electronically signed by Arvilla Meres MD Signature Date/Time: 05/06/2023/12:42:07 PM    Final    CT Angio Chest Pulmonary Embolism (PE) W or WO Contrast Addendum Date: 05/05/2023 ADDENDUM REPORT: 05/05/2023 20:50 ADDENDUM: These results were called by telephone at the time of interpretation on 05/05/2023 at 8:50 pm to provider nurse Onalee Hua, who verbally acknowledged these results. Electronically Signed   By: Darliss Cheney M.D.   On: 05/05/2023 20:50   Result Date: 05/05/2023 CLINICAL DATA:  High probability for PE. EXAM: CT ANGIOGRAPHY CHEST WITH CONTRAST TECHNIQUE: Multidetector CT imaging of the chest was performed using the standard protocol during bolus administration of intravenous contrast. Multiplanar CT image reconstructions and MIPs were obtained to evaluate  the vascular anatomy. RADIATION DOSE REDUCTION: This exam was performed according to the departmental dose-optimization program which includes automated exposure control, adjustment of the mA and/or kV according to patient size and/or use of iterative reconstruction technique. CONTRAST:  75mL OMNIPAQUE IOHEXOL 350 MG/ML SOLN COMPARISON:  CT chest 12/15/2019 FINDINGS: Cardiovascular: There small segmental  and subsegmental bilateral lower lobe pulmonary emboli. Heart and aorta are normal in size. There is no pericardial effusion. Mediastinum/Nodes: Enteric tube is seen throughout the esophagus. There is an enlarged subcarinal lymph node measuring 12 mm short axis. There are enlarged left hilar lymph nodes measuring 12 mm short axis. There are enlarged right hilar lymph nodes measuring 12 mm short axis. The visualized thyroid gland is within normal limits. Lungs/Pleura: Endotracheal tube tip is 2.6 cm above the carina. Mild patchy multifocal ground-glass opacities are seen in the bilateral upper lobes. There is bilateral lower lobe airspace consolidation, left greater than right. No pneumothorax. There is a trace left pleural effusion. Upper Abdomen: Enteric tube tip is in the body of the stomach. Musculoskeletal: Left shoulder arthroplasty is present. There is mild compression deformity of the superior endplate of T2 and T9. There is moderate compression deformity of T12. These all appear chronic. Thoracic spinal cord stimulator device is present. The bones are osteopenic. Review of the MIP images confirms the above findings. IMPRESSION: 1. Small segmental and subsegmental bilateral lower lobe pulmonary emboli. No right heart strain. 2. Bilateral lower lobe airspace consolidation, left greater than right, worrisome for pneumonia. 3. Mild patchy multifocal ground-glass opacities in the bilateral upper lobes, likely infectious/inflammatory. 4. Trace left pleural effusion. 5. Mediastinal and hilar lymphadenopathy, likely reactive. 6. Endotracheal tube tip is 2.6 cm above the carina. 7. Enteric tube tip is in the body of the stomach. 8. Chronic compression deformities of T2, T9, and T12. Electronically Signed: By: Darliss Cheney M.D. On: 05/05/2023 18:31   DG Chest Portable 1 View Result Date: 05/05/2023 CLINICAL DATA:  Endotracheal tube placement. EXAM: PORTABLE CHEST 1 VIEW COMPARISON:  05/04/2023 FINDINGS:  Endotracheal tube tip is positioned at the carina, about 7 mm above the base of the carina and directed towards the right mainstem bronchus. The NG tube passes into the stomach although the distal tip position is not included on the film. There is diffuse interstitial opacity in both lungs with interval progression of diffuse airspace disease in the left lung compatible with asymmetric edema or infection. Thoracic spinal stimulator device evident. Telemetry leads overlie the chest. IMPRESSION: 1. Endotracheal tube tip is positioned about 7 mm above the base of the carina and directed towards the right mainstem bronchus. Recommend retraction by about 2 cm. 2. Interval progression of diffuse airspace disease in the left lung compatible with asymmetric edema or infection. These results will be called to the ordering clinician or representative by the Radiologist Assistant, and communication documented in the PACS or [employer]. Electronically Signed   By: Kennith Center M.D.   On: 05/05/2023 06:10   DG Abdomen 1 View Result Date: 05/05/2023 CLINICAL DATA:  OG tube placement. EXAM: ABDOMEN - 1 VIEW COMPARISON:  None Available. FINDINGS: OG tube tip is in the mid stomach with proximal side port at or just below the GE junction. Visualized upper abdomen demonstrates nonspecific bowel gas pattern. IMPRESSION: OG tube tip is in the mid stomach with proximal side port at or just below the GE junction. Tube could be advanced 2-3 cm to ensure side port position below the GE junction as warranted.  Electronically Signed   By: Kennith Center M.D.   On: 05/05/2023 06:06   DG Chest Portable 1 View Result Date: 05/04/2023 CLINICAL DATA:  Respiratory distress EXAM: PORTABLE CHEST 1 VIEW COMPARISON:  12/20/2022 FINDINGS: Lung volumes are slightly small burr symmetric and stable since prior examination. No focal pulmonary infiltrate. No pneumothorax. Small left pleural effusion versus pleural thickening is unchanged.  Progressive bilateral hilar enlargement is present which may relate to progressive changes of pulmonary arterial hypertension hilar adenopathy. Cardiac size within normal limits. Dorsal column stimulator lead noted. IMPRESSION: 1. Progressive bilateral hilar enlargement which may relate to progressive changes of pulmonary arterial hypertension or hilar adenopathy. This could be confirmed with contrast enhanced CT imaging. 2. Stable small left pleural effusion versus pleural thickening. Electronically Signed   By: Helyn Numbers M.D.   On: 05/04/2023 22:13    Recent Labs: Lab Results  Component Value Date   WBC 7.0 05/11/2023   HGB 13.0 05/11/2023   PLT 219 05/11/2023   NA 139 05/11/2023   K 4.3 05/11/2023   CL 103 05/11/2023   CO2 29 05/11/2023   GLUCOSE 84 05/11/2023   BUN 27 (H) 05/11/2023   CREATININE 1.35 (H) 05/11/2023   BILITOT 0.9 05/04/2023   ALKPHOS 40 05/04/2023   AST 37 05/04/2023   ALT 28 05/04/2023   PROT 6.5 05/04/2023   ALBUMIN 3.2 (L) 05/04/2023   CALCIUM 9.1 05/11/2023   GFRAA >60 01/29/2015   QFTBGOLDPLUS NEGATIVE 11/07/2022    Speciality Comments: No specialty comments available.  Procedures:  No procedures performed Allergies: Lithium; Tegretol [carbamazepine]; Tricyclic antidepressants; Methotrexate derivatives; Strawberry extract; Tapentadol; Codeine; Cymbalta [duloxetine hcl]; Erythromycin; Lyrica [pregabalin]; Neurontin [gabapentin]; Rabeprazole sodium; Baclofen; Duraprep [antiseptic products, misc.]; Penicillins; and Tape   Assessment / Plan:     Visit Diagnoses: No diagnosis found.  ***  Orders: No orders of the defined types were placed in this encounter.  No orders of the defined types were placed in this encounter.    Follow-Up Instructions: No follow-ups on file.   Metta Clines, RT  Note - This record has been created using [employer].  Chart creation errors have been sought, but may not always  have been located. Such creation  errors do not reflect on  the standard of medical care.

## 2023-05-20 ENCOUNTER — Encounter: Payer: Self-pay | Admitting: Family Medicine

## 2023-05-20 NOTE — Telephone Encounter (Signed)
 I think she is at [facility]  If needed-please call and let them know she is not on zepbound because insurance did not pay Thanks

## 2023-05-21 NOTE — Telephone Encounter (Signed)
 Called [place] and they d/c med.

## 2023-05-28 ENCOUNTER — Ambulatory Visit: Payer: Medicare Other | Admitting: Internal Medicine

## 2023-05-28 DIAGNOSIS — R296 Repeated falls: Secondary | ICD-10-CM

## 2023-05-28 DIAGNOSIS — M0600 Rheumatoid arthritis without rheumatoid factor, unspecified site: Secondary | ICD-10-CM

## 2023-05-28 DIAGNOSIS — Z7952 Long term (current) use of systemic steroids: Secondary | ICD-10-CM

## 2023-05-28 DIAGNOSIS — G894 Chronic pain syndrome: Secondary | ICD-10-CM

## 2023-05-28 DIAGNOSIS — Z79899 Other long term (current) drug therapy: Secondary | ICD-10-CM

## 2023-05-28 DIAGNOSIS — R29898 Other symptoms and signs involving the musculoskeletal system: Secondary | ICD-10-CM

## 2023-05-28 DIAGNOSIS — G609 Hereditary and idiopathic neuropathy, unspecified: Secondary | ICD-10-CM

## 2023-05-28 DIAGNOSIS — M8000XS Age-related osteoporosis with current pathological fracture, unspecified site, sequela: Secondary | ICD-10-CM

## 2023-05-29 ENCOUNTER — Other Ambulatory Visit: Payer: Self-pay | Admitting: *Deleted

## 2023-05-29 ENCOUNTER — Encounter: Payer: Self-pay | Admitting: Family Medicine

## 2023-05-29 NOTE — Patient Outreach (Addendum)
 Post-Acute Care Manager follow up. Ms. Mordan resides in [facility] and [place]. She has been active with [facility] CCM team as benefit of health plan and PCP.   Telephone call made to Ms. Hougland to discuss transition plan and needs. No answer. HIPAA compliant voicemail message requesting call back. Will plan outreach to SNF social worker for collaboration.    Addendum: Telephone call received from Ms. Jarvis Newcomer. Ms. Reinhardt reports she is returning home on this Friday. Not sure which home health agency will be arranged. States she is doing very well and is ready to go. States she plans to call PCP office to schedule post SNF follow up appointment. Lives with sister. Ms. Peron denies any concerns about returning home.   Terri, SNF social worker reports [facility] will provide home health services.   Will update [facility] RNCM.    Raiford Noble, MSN, RN, BSN [place]  [facility], [facility] RN Post- Acute Care Manager Direct Dial: [phone]

## 2023-06-03 ENCOUNTER — Other Ambulatory Visit: Payer: Self-pay | Admitting: *Deleted

## 2023-06-03 ENCOUNTER — Telehealth: Payer: Self-pay | Admitting: Family Medicine

## 2023-06-03 ENCOUNTER — Telehealth: Payer: Self-pay

## 2023-06-03 NOTE — Telephone Encounter (Signed)
 I recommend follow up with orthopedics or rheumatology for this  Has she addressed this knee problem with rheumatology or orthopedics in the past? I know she has a complex joint pain history   Thanks   Let me know what she prefers and I can place referral if needed

## 2023-06-03 NOTE — Telephone Encounter (Signed)
 Copied from CRM [id number]. Topic: Appointments - Scheduling Inquiry for Clinic >> Jun 03, 2023  2:22 PM Higinio Roger wrote: Reason for CRM: [facility] called to stated patient is still experiencing sharp pain in left knee when walking and would like patient to follow up with PCP.  Patient callback #: [phone]

## 2023-06-03 NOTE — Progress Notes (Signed)
 Post- Acute Care Manager follow up.   Spoke with [facility] SNF social worker. Confirmed Ms. Boggess discharged on 05/31/23. Terri reports [facility] was arranged, not [facility].   Update sent to [facility] RN CM.   Raiford Noble, MSN, RN, BSN [place]  [facility], [facility] RN Post- Acute Care Manager Direct Dial: [phone]

## 2023-06-04 ENCOUNTER — Telehealth: Payer: Self-pay | Admitting: Family Medicine

## 2023-06-04 ENCOUNTER — Ambulatory Visit: Admitting: Family Medicine

## 2023-06-04 NOTE — Telephone Encounter (Signed)
 See prev message:  Last A1c was in the hospital on 05/05/23 it was 5.6 (will advised when I return call).  Dr. Milinda Antis please see med interaction comments below  Pt has f/u scheduled Thursday

## 2023-06-04 NOTE — Telephone Encounter (Signed)
 Copied from CRM [id number]. Topic: Clinical - Home Health Verbal Orders >> Jun 04, 2023  3:35 PM [place] wrote: Reason for CRM: [place] [phone] RN with [facility]- asking to fax recent A1C to office # [phone]    Medication interaction- Level 2 interaction carvedilol and abulteral inhaler. Also level 2 breztri inhaler and carvedilol.

## 2023-06-04 NOTE — Telephone Encounter (Signed)
Good. Thanks for the update.  

## 2023-06-04 NOTE — Telephone Encounter (Signed)
 Pt said she iced her knee after coming home and the pain resolved completely, that's why she didn't f/u with ortho or Rheum. Pt does have a f/u with PCP on Thursday but said knee isn't an issue anymore

## 2023-06-04 NOTE — Telephone Encounter (Signed)
Aware, thanks!

## 2023-06-06 ENCOUNTER — Encounter: Payer: Self-pay | Admitting: Family Medicine

## 2023-06-06 ENCOUNTER — Ambulatory Visit: Admitting: Family Medicine

## 2023-06-06 VITALS — BP 135/65 | HR 77 | Temp 98.4°F | Ht 66.0 in | Wt 218.2 lb

## 2023-06-06 DIAGNOSIS — M0600 Rheumatoid arthritis without rheumatoid factor, unspecified site: Secondary | ICD-10-CM

## 2023-06-06 DIAGNOSIS — J9601 Acute respiratory failure with hypoxia: Secondary | ICD-10-CM

## 2023-06-06 DIAGNOSIS — N1832 Chronic kidney disease, stage 3b: Secondary | ICD-10-CM

## 2023-06-06 DIAGNOSIS — I10 Essential (primary) hypertension: Secondary | ICD-10-CM

## 2023-06-06 DIAGNOSIS — J449 Chronic obstructive pulmonary disease, unspecified: Secondary | ICD-10-CM | POA: Diagnosis not present

## 2023-06-06 DIAGNOSIS — J9602 Acute respiratory failure with hypercapnia: Secondary | ICD-10-CM

## 2023-06-06 DIAGNOSIS — Z86711 Personal history of pulmonary embolism: Secondary | ICD-10-CM

## 2023-06-06 DIAGNOSIS — Z7409 Other reduced mobility: Secondary | ICD-10-CM

## 2023-06-06 DIAGNOSIS — J441 Chronic obstructive pulmonary disease with (acute) exacerbation: Secondary | ICD-10-CM

## 2023-06-06 DIAGNOSIS — F319 Bipolar disorder, unspecified: Secondary | ICD-10-CM

## 2023-06-06 DIAGNOSIS — Q2730 Arteriovenous malformation, site unspecified: Secondary | ICD-10-CM

## 2023-06-06 DIAGNOSIS — R531 Weakness: Secondary | ICD-10-CM

## 2023-06-06 DIAGNOSIS — G9340 Encephalopathy, unspecified: Secondary | ICD-10-CM

## 2023-06-06 DIAGNOSIS — E669 Obesity, unspecified: Secondary | ICD-10-CM

## 2023-06-06 DIAGNOSIS — F609 Personality disorder, unspecified: Secondary | ICD-10-CM

## 2023-06-06 DIAGNOSIS — I5189 Other ill-defined heart diseases: Secondary | ICD-10-CM | POA: Insufficient documentation

## 2023-06-06 LAB — BASIC METABOLIC PANEL WITH GFR
BUN: 23 mg/dL (ref 6–23)
CO2: 31 meq/L (ref 19–32)
Calcium: 9.4 mg/dL (ref 8.4–10.5)
Chloride: 104 meq/L (ref 96–112)
Creatinine, Ser: 1.04 mg/dL (ref 0.40–1.20)
GFR: 53.52 mL/min — ABNORMAL LOW (ref >60.00)
Glucose, Bld: 87 mg/dL (ref 70–99)
Potassium: 3.8 meq/L (ref 3.5–5.1)
Sodium: 143 meq/L (ref 135–145)

## 2023-06-06 LAB — CBC WITH DIFFERENTIAL/PLATELET
Basophils Absolute: 0 K/uL (ref 0.0–0.1)
Basophils Relative: 0.4 % (ref 0.0–3.0)
Eosinophils Absolute: 0.1 K/uL (ref 0.0–0.7)
Eosinophils Relative: 0.9 % (ref 0.0–5.0)
HCT: 37.9 % (ref 36.0–46.0)
Hemoglobin: 12.4 g/dL (ref 12.0–15.0)
Lymphocytes Relative: 46 % (ref 12.0–46.0)
Lymphs Abs: 3.4 K/uL (ref 0.7–4.0)
MCHC: 32.7 g/dL (ref 30.0–36.0)
MCV: 98.1 fl (ref 78.0–100.0)
Monocytes Absolute: 0.6 K/uL (ref 0.1–1.0)
Monocytes Relative: 8.7 % (ref 3.0–12.0)
Neutro Abs: 3.3 K/uL (ref 1.4–7.7)
Neutrophils Relative %: 44 % (ref 43.0–77.0)
Platelets: 180 K/uL (ref 150.0–400.0)
RBC: 3.86 Mil/uL — ABNORMAL LOW (ref 3.87–5.11)
RDW: 16.1 % — ABNORMAL HIGH (ref 11.5–15.5)
WBC: 7.4 K/uL (ref 4.0–10.5)

## 2023-06-06 MED ORDER — APIXABAN 5 MG PO TABS: 5.0000 mg | ORAL_TABLET | Freq: Two times a day (BID) | ORAL | 1 refills | Status: DC

## 2023-06-06 MED ORDER — CARVEDILOL 3.125 MG PO TABS: 3.1250 mg | ORAL_TABLET | Freq: Two times a day (BID) | ORAL | 3 refills | Status: DC

## 2023-06-06 NOTE — Assessment & Plan Note (Signed)
 Ascending colon  No recent bleeding  Had another PE/started on eliqus and tolerating well No signs and symptoms of GI bleed  Will follow cautiously   Cbc today

## 2023-06-06 NOTE — Assessment & Plan Note (Signed)
 Is back on 10 mg prednisone daily  Rheum care  Enbrel

## 2023-06-06 NOTE — Progress Notes (Signed)
 Subjective:    Patient ID: Lori Orozco, female    DOB: 01/29/1951, 73 y.o.   MRN: [id number]  HPI  Wt Readings from Last 3 Encounters:  06/06/23 218 lb 4 oz (99 kg)  05/11/23 238 lb 12.1 oz (108.3 kg)  02/08/23 224 lb 2 oz (101.7 kg)   35.23 kg/m  Vitals:   06/06/23 1056 06/06/23 1139  BP: (!) 148/64 135/65  Pulse: 77   Temp: 98.4 F (36.9 C)   SpO2: 95%     Pt presents for follow up of hosp and rehab stay after CAP and copd exacerbation , also PE   Was in [facility]  D/c 05/31/23 wit home health ([facility])   Hosp discharge /course:  Acute hypoxic/hypercarbic respiratory failure Acute COPD exacerbation with emphysema Bilateral segmental/subsegmental pulmonary embolism without cor pulmonale Community-acquired pneumonia, bilateral -Initially requiring intubation on 3/9, extubated 3/11.  Workup showed pulmonary embolism with concerns of pneumonia.  Started on Eliquis which patient is tolerating well.  Echocardiogram showed EF of 60 to 65%, lower extremity Dopplers negative for DVT.  Initially on heparin drip thereafter transition to p.o. Eliquis. She will be on it for atleast 6 months - 1 yr. PCP can further evaluate this depending on her condition at that time and risk/benefits.  - Completed 5 days of prednisone and Rocephin.  Completed 3 days of azithromycin. - Bronchodilators.  I-S/flutter valve   Chronic kidney disease 3A -Creatinine around baseline 1.2   Bipolar disorder -Continue home Wellbutrin, BuSpar, Depakote, Remeron, Zoloft   Type 2 DM -Intermittent signs of hypoglycemia.  Appears to be slowly improving   Rheumatoid Arthritis - Treated for COPD, now on prednisone 10 mg daily   HTN On Coreg.  Uptitrate as necessary.  IV as needed  Metoprolol was changed to carvedilol    HLD -Statin  Has a medic alert button   Prednisone 10 mg daily for RA now - back on that      Lab Results  Component Value Date   NA 139 05/11/2023   K 4.3  05/11/2023   CO2 29 05/11/2023   GLUCOSE 84 05/11/2023   BUN 27 (H) 05/11/2023   CREATININE 1.35 (H) 05/11/2023   CALCIUM 9.1 05/11/2023   GFR 50.36 (L) 07/13/2022   EGFR 51 (L) 12/24/2022   GFRNONAA 42 (L) 05/11/2023   Lab Results  Component Value Date   ALT 28 05/04/2023   AST 37 05/04/2023   ALKPHOS 40 05/04/2023   BILITOT 0.9 05/04/2023   Lab Results  Component Value Date   WBC 7.0 05/11/2023   HGB 13.0 05/11/2023   HCT 41.6 05/11/2023   MCV 99.5 05/11/2023   PLT 219 05/11/2023   Lab Results  Component Value Date   HGBA1C 5.6 05/05/2023   HGBA1C 5.2 07/13/2022   HGBA1C 5.4 07/11/2021     Patient Active Problem List   Diagnosis Date Noted   Diastolic dysfunction 06/06/2023   Class 2 severe obesity due to excess calories with serious comorbidity and body mass index (BMI) of 35.0 to 35.9 in adult (HCC) 06/06/2023   Hypertensive urgency 05/05/2023   CKD stage 3b, GFR 30-44 ml/min (HCC) 05/05/2023   Elevated troponin 05/05/2023   Depression with anxiety 05/05/2023   Cerumen impaction 03/06/2023   Lesion of palate 03/06/2023   Mobility impaired 02/10/2023   Generalized weakness 02/08/2023   Recurrent falls 12/24/2022   Pre-operative clearance 10/30/2022   Diarrhea 10/19/2022   Cold intolerance 10/19/2022   Osteoporosis 09/10/2022  Laceration of forehead 08/29/2022   Thoracic compression fracture (HCC) 07/13/2022   Diabetes mellitus screening 07/13/2022   Lumbar herniated disc    Chronic pain 11/28/2021   Bilateral leg weakness 10/09/2021   Vitamin B12 deficiency 08/02/2021   Myofascial pain dysfunction syndrome 08/02/2021   Adverse effect of prednisone 07/11/2021   AVM (arteriovenous malformation) 01/03/2021   History of pulmonary embolism 12/08/2020   Arthritis of knee 12/06/2020   Bilateral knee pain 09/20/2020   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   High risk medication use 09/20/2020   Extensor tenosynovitis of right wrist 09/15/2020    S/P reverse total shoulder arthroplasty, 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   Lung nodule 01/02/2020   Status post arthroscopy of left shoulder 01/16/2018   Estrogen deficiency 06/28/2017   Medial epicondylitis, left elbow 04/29/2017   S/P arthroscopy of left shoulder 02/25/2017   Impingement syndrome of left shoulder 01/30/2017   Trochanteric bursitis, right hip 04/10/2016   Hypertension 09/02/2015   Urge incontinence 07/27/2015   Obesity (BMI 30-39.9) 04/28/2015   Osteoarthritis of left knee 01/28/2015   Status post total left knee replacement 01/28/2015   Vitamin D deficiency 04/27/2014   Seasonal and perennial allergic rhinitis 06/17/2013   S/P total hip arthroplasty 03/20/2013   Medicare annual wellness visit, subsequent 11/18/2012   At high risk for falls 11/18/2012   Routine general medical examination at a health care facility 11/10/2012   Lichen sclerosus et atrophicus of the vulva 07/29/2012   Osteopenia 10/19/2011   Hypothyroidism 10/19/2011   Hereditary and idiopathic peripheral neuropathy 08/05/2009   Low back pain 08/05/2009   HAND PAIN, BILATERAL 08/05/2009   TREMOR 03/09/2008   MENOPAUSAL SYNDROME 10/09/2007   DEPRESSION, MAJOR 05/20/2007   BIPOLAR AFFECTIVE DISORDER 05/20/2007   Generalized anxiety disorder 05/20/2007   PERSONALITY DISORDER 05/20/2007   ESOPHAGEAL SPASM 05/20/2007   Diaphragmatic hernia 05/20/2007   COPD mixed type (HCC) 03/27/2007   HYPERCHOLESTEROLEMIA, PURE 12/10/2006   SYMPTOM, SYNDROME, CHRONIC FATIGUE 12/10/2006   Past Medical History:  Diagnosis Date   Allergy Hayfever   1958   Amaurosis fugax    Anemia    hx   Anxiety    Arthritis    Asthma    Cataract    Chronic fatigue syndrome    Chronic kidney disease    frequency, nephritis when 74 yrs old   COPD (chronic obstructive pulmonary disease) (HCC)    COVID-19    Depression    Diverticulitis    Emphysema of lung (HCC)    Fibromyalgia    GERD (gastroesophageal reflux disease)    occ    H/O hiatal hernia    History of bronchitis    Hyperlipidemia    Hypothyroidism    Interstitial cystitis    Irritable bowel syndrome    Lichen sclerosus    Lumbar herniated disc    Migraines    Motor nerve conduction block 11/2021   Osteoporosis 09/10/2022   Pneumonia    PONV (postoperative nausea and vomiting)    Shortness of breath    exertion    Thyroid disease    [place]   Urinary frequency    Urinary tract infection    Vertigo    Past Surgical History:  Procedure Laterality Date   ABDOMINAL HYSTERECTOMY     bladder sugery      CAROTID DOPPLAR     COLONOSCOPY  05/26/2010   avms- otherwise nl , re check 10y   DEXA-OSTEOPENIA     DOPPLER ECHOCARDIOGRAPHY  elbow surgery     EPICONDYLITIS     EYE SURGERY Bilateral    cataracts   FOOT SURGERY Bilateral    I & D EXTREMITY Right 09/01/2020   Procedure: TYNOSYNOVECTOMY;  Surgeon: [name], MD;  Location: [facility] OR;  Service: Orthopedics;  Laterality: Right;   IMPLANTATION VAGAL NERVE STIMULATOR  03/2022   JOINT REPLACEMENT     KNEE SURGERY     Left cartilage   lipoma in second finger right hand     MOUTH SURGERY  08/22/2022   PLANTAR FASCIA SURGERY Left    REVERSE SHOULDER ARTHROPLASTY Left 02/02/2020   Procedure: LEFT REVERSE SHOULDER ARTHROPLASTY;  Surgeon: [name], MD;  Location: [facility] OR;  Service: Orthopedics;  Laterality: Left;   ROTATOR CUFF REPAIR Left 12/2017   TEMPOROMANDIBULAR JOINT SURGERY     TONSILLECTOMY     TOTAL HIP ARTHROPLASTY Right 03/20/2013   Procedure: Right TOTAL HIP ARTHROPLASTY;  Surgeon: [name], MD;  Location: [facility] OR;  Service: Orthopedics;  Laterality: Right;  Right Total Hip Arthroplasty   TOTAL KNEE ARTHROPLASTY Left 01/28/2015   Procedure: LEFT TOTAL KNEE ARTHROPLASTY;  Surgeon: [name], MD;  Location: [facility] ORS;  Service: Orthopedics;  Laterality: Left;   TRIGGER FINGER RELEASE Right    TROCHANTERIC BURSA EXCISION Right 05/03/2016   Dr. [name],  [name]   WRIST ARTHROSCOPY Right    ligament tear   Social History   Tobacco Use   Smoking status: Former    Current packs/day: 0.00    Average packs/day: 2.0 packs/day for 43.0 years (86.0 ttl pk-yrs)    Types: Cigarettes    Start date: 02/26/1970    Quit date: 02/26/2013    Years since quitting: 10.2    Passive exposure: Never   Smokeless tobacco: Never   Tobacco comments:    Vaping occasionally  Vaping Use   Vaping status: Some Days   Devices: uses about once per month  Substance Use Topics   Alcohol use: No   Drug use: No   Family History  Problem Relation Age of Onset   Arthritis Mother    Hypertension Mother    Kidney disease Mother    Heart disease Mother    Stroke Mother    Cancer Father        Bladder   Arthritis Sister    Heart disease Sister    Colon cancer Other    Cancer - Lung Cousin    Arthritis Maternal Grandmother    Hearing loss Maternal Grandfather    Varicose Veins Maternal Uncle    Allergies  Allergen Reactions   Lithium Anaphylaxis   Tegretol [Carbamazepine] Other (See Comments)    Fever and body aches (fever over 103)   Tricyclic Antidepressants Anaphylaxis    Other reaction(s): Unknown   Methotrexate Derivatives Other (See Comments)    Urinary retention and dizziness  Other reaction(s): Other UTI   Strawberry Extract Hives   Tapentadol Rash    PT ALLERGIC NYLON TAPE    Codeine Nausea Only    Makes pt stay awake   Cymbalta [Duloxetine Hcl] Other (See Comments)    Makes pt pass out    Erythromycin     abdominal pain   Lyrica [Pregabalin]     Felt faint   Neurontin [Gabapentin]     Passes  out   Rabeprazole Sodium     insomnia   Baclofen Nausea Only    Sever nausea   Duraprep [employer], Misc.]  Itching and Rash    Tolerates Betadine    Penicillins Rash    Tolerated ANCEF 02/02/20  Has patient had a PCN reaction causing immediate rash, facial/tongue/throat swelling, SOB or lightheadedness with hypotension:  Yes Has patient had a PCN reaction causing severe rash involving mucus membranes or skin necrosis: No Has patient had a PCN reaction that required hospitalization No Has patient had a PCN reaction occurring within the last 10 years: No If all of the above answers are "NO", then may proceed with Cephalosporin use.    Tape Rash    PT ALLERGIC NYLON TAPE    Current Outpatient Medications on File Prior to Visit  Medication Sig Dispense Refill   acetaminophen (TYLENOL) 500 MG tablet Take 500 mg by mouth every 6 (six) hours as needed.     alendronate (FOSAMAX) 70 MG tablet TAKE 1 TABLET BY MOUTH ONCE A WEEK. TAKE WITH A FULL GLASS OF WATER ON AN EMPTY STOMACH. 12 tablet 1   Black Cohosh 40 MG CAPS Take 1 capsule (40 mg total) by mouth daily. 30 capsule 11   Budeson-Glycopyrrol-Formoterol (BREZTRI AEROSPHERE) 160-9-4.8 MCG/ACT AERO Inhale 2 puffs into the lungs 2 (two) times daily. 3 each 4   buPROPion (WELLBUTRIN XL) 150 MG 24 hr tablet Take 150 mg by mouth daily.     busPIRone (BUSPAR) 15 MG tablet Take 15 mg by mouth 2 (two) times daily before a meal.     cholecalciferol (VITAMIN D) 25 MCG (1000 UNIT) tablet Take 2,000 Units by mouth daily. 2 tablets daily     clobetasol cream (TEMOVATE) 0.05 % APPLY SMALL AMOUNT TO AFFECTED AREAS TWO TO THREE TIMES PER WEEK 30 g 0   cyanocobalamin 2000 MCG tablet Take 500 mcg by mouth daily.     diclofenac Sodium (VOLTAREN) 1 % GEL Apply 2 g topically 2 (two) times daily as needed (pain).     diphenhydrAMINE (BENADRYL) 25 MG tablet Take 25 mg by mouth every 6 (six) hours as needed for allergies.     divalproex (DEPAKOTE ER) 500 MG 24 hr tablet Take 500 mg by mouth at bedtime.      docusate sodium (COLACE) 100 MG capsule Take 1 capsule (100 mg total) by mouth 2 (two) times daily as needed for mild constipation.     etanercept (ENBREL SURECLICK) 50 MG/ML injection Inject 50 mg into the skin once a week. 4 mL 0   famotidine (PEPCID) 20 MG tablet Take 1 tablet (20  mg total) by mouth daily.     fluticasone (FLONASE) 50 MCG/ACT nasal spray PLACE 2 SPRAYS INTO THE NOSE DAILY. 16 g 11   HYDROcodone-acetaminophen (NORCO/VICODIN) 5-325 MG tablet Take 1 tablet by mouth 3 (three) times daily as needed for severe pain (pain score 7-10). 15 tablet 0   hydrocortisone cream 1 % Apply 1 Application topically 2 (two) times daily as needed for itching.     ipratropium-albuterol (DUONEB) 0.5-2.5 (3) MG/3ML SOLN Take 3 mLs by nebulization 2 (two) times daily.     ketotifen (ZADITOR) 0.025 % ophthalmic solution Place 1 drop into both eyes 2 (two) times daily as needed (allergies).     levothyroxine (SYNTHROID) 50 MCG tablet TAKE 1 TABLET BY MOUTH DAILY BEFORE BREAKFAST 90 tablet 1   LORazepam (ATIVAN) 1 MG tablet Take 1 tablet (1 mg total) by mouth at bedtime as needed for anxiety or sleep. 7 tablet 0   mirabegron ER (MYRBETRIQ) 25 MG TB24 tablet Take 1 tablet (25 mg total) by  mouth in the morning and at bedtime. 180 tablet 3   mirtazapine (REMERON) 30 MG tablet Take 30 mg by mouth at bedtime.      Multiple Vitamin (MULTIVITAMIN WITH MINERALS) TABS tablet Take 1 tablet by mouth daily.     ondansetron (ZOFRAN-ODT) 4 MG disintegrating tablet Take 1 tablet (4 mg total) by mouth every 8 (eight) hours as needed for nausea or vomiting. 20 tablet 1   predniSONE (DELTASONE) 10 MG tablet TAKE 1 TABLET (10 MG TOTAL) BY MOUTH DAILY WITH BREAKFAST. 90 tablet 0   Rhubarb (ESTROVEN COMPLETE) 4 MG TABS Take 4 mg by mouth daily. 30 tablet 11   sertraline (ZOLOFT) 100 MG tablet Take 100 mg by mouth daily.      simvastatin (ZOCOR) 20 MG tablet TAKE 1 TABLET BY MOUTH AT BEDTIME 90 tablet 0   Spacer/Aero-Holding Chambers (AEROCHAMBER PLUS) inhaler Use with inhaler 1 each 2   thiamine (VITAMIN B-1) 100 MG tablet Take 1 tablet (100 mg total) by mouth daily.     traMADol (ULTRAM) 50 MG tablet Take 1 tablet (50 mg total) by mouth every 6 (six) hours as needed for moderate pain (pain score 4-6). 15  tablet 0   No current facility-administered medications on file prior to visit.    Review of Systems  Constitutional:  Positive for fatigue. Negative for activity change, appetite change, fever and unexpected weight change.  HENT:  Negative for congestion, ear pain, rhinorrhea, sinus pressure and sore throat.   Eyes:  Negative for pain, redness and visual disturbance.  Respiratory:  Negative for cough, shortness of breath, wheezing and stridor.   Cardiovascular:  Negative for chest pain and palpitations.  Gastrointestinal:  Negative for abdominal pain, blood in stool, constipation and diarrhea.  Endocrine: Negative for polydipsia and polyuria.  Genitourinary:  Negative for dysuria, frequency and urgency.  Musculoskeletal:  Positive for arthralgias and back pain. Negative for myalgias.  Skin:  Negative for pallor and rash.  Allergic/Immunologic: Negative for environmental allergies.  Neurological:  Negative for dizziness, syncope and headaches.       Generalized weakness Uses standing walker  Hematological:  Negative for adenopathy. Does not bruise/bleed easily.  Psychiatric/Behavioral:  Negative for decreased concentration and dysphoric mood. The patient is not nervous/anxious.        Objective:   Physical Exam Constitutional:      General: She is not in acute distress.    Appearance: Normal appearance. She is well-developed. She is obese. She is not ill-appearing or diaphoretic.  HENT:     Head: Normocephalic and atraumatic.     Mouth/Throat:     Mouth: Mucous membranes are moist.  Eyes:     General:        Right eye: No discharge.        Left eye: No discharge.     Conjunctiva/sclera: Conjunctivae normal.     Pupils: Pupils are equal, round, and reactive to light.  Neck:     Thyroid: No thyromegaly.     Vascular: No carotid bruit or JVD.  Cardiovascular:     Rate and Rhythm: Normal rate and regular rhythm.     Heart sounds: Normal heart sounds.     No gallop.   Pulmonary:     Effort: Pulmonary effort is normal. No respiratory distress.     Breath sounds: Normal breath sounds. No stridor. No wheezing, rhonchi or rales.     Comments: Diffusely distant bs  Abdominal:     General: There is  no distension or abdominal bruit.     Palpations: Abdomen is soft.  Musculoskeletal:     Cervical back: Normal range of motion and neck supple.     Right lower leg: No edema.     Left lower leg: No edema.  Lymphadenopathy:     Cervical: No cervical adenopathy.  Skin:    General: Skin is warm and dry.     Coloration: Skin is not pale.     Findings: No rash.  Neurological:     Mental Status: She is alert.     Cranial Nerves: No cranial nerve deficit.     Coordination: Coordination normal.     Deep Tendon Reflexes: Reflexes are normal and symmetric. Reflexes normal.     Comments: Gen weakness/not focal   Uses standing walker for ambulation   Psychiatric:        Mood and Affect: Mood normal.           Assessment & Plan:   Problem List Items Addressed This Visit       Cardiovascular and Mediastinum   Hypertension   bp in fair control at this time  BP Readings from Last 1 Encounters:  06/06/23 135/65   Metoprolol was changed bo carvedilol during recent hosp 3.125 mg bid /tolerating well  Most recent labs reviewed  Disc lifstyle change with low sodium diet and exercise   Has mild LVH on echo  Continue to monitor        Relevant Medications   apixaban (ELIQUIS) 5 MG TABS tablet   carvedilol (COREG) 3.125 MG tablet   Other Relevant Orders   Basic metabolic panel with GFR   CBC with Differential/Platelet   AVM (arteriovenous malformation)   Ascending colon  No recent bleeding  Had another PE/started on eliqus and tolerating well No signs and symptoms of GI bleed  Will follow cautiously   Cbc today      Relevant Medications   apixaban (ELIQUIS) 5 MG TABS tablet   carvedilol (COREG) 3.125 MG tablet     Respiratory   RESOLVED:  COPD with acute exacerbation (HCC)   Recent hosp -with pna and PE Reviewed hospital records, lab results and studies in detail   Much improved now s/p antibiotic and steroids       COPD mixed type (HCC)   Back to baseline after hosp for pna and PE -req intubation  Much improved  Reviewed hospital records, lab results and studies in detail   Reassuring exam  Back on her breztri ,duo neb and pulm care  Reassuring exam      RESOLVED: Acute respiratory failure with hypoxia and hypercapnia (HCC)   Recent hosp  PE and pna in setting of copd  Was intubated  Now resolved with steroids, eliqus and antibiotic  Back to baseline  No 02 needed         Nervous and Auditory   RESOLVED: Acute encephalopathy   During hosp with hypoxia and infection Now resolved Reviewed hospital records, lab results and studies in detail          Musculoskeletal and Integument   Seronegative rheumatoid arthritis (HCC)   Is back on 10 mg prednisone daily  Rheum care  Enbrel         Genitourinary   CKD stage 3b, GFR 30-44 ml/min (HCC)   Bmet today  S/p recent hosp for pna and PE         Other   PERSONALITY DISORDER   Under psychiatric  care       Obesity (BMI 30-39.9)   Discussed how this problem influences overall health and the risks it imposes  Reviewed plan for weight loss with lower calorie diet (via better food choices (lower glycemic and portion control) along with exercise building up to or more than 30 minutes 5 days per week including some aerobic activity and strength training   Pt is interested in compounded glp-1 in future from on line weight loss program  Aware she will need to gain muscle/get stronger for this to be safe         Mobility impaired   Continues walker  HH for PT s/p hospitalization       History of pulmonary embolism - Primary   2nd time  Reviewed hospital records, lab results and studies in detail   On eliquis 5 mg bid Aware of colon avm /in hopes  this will not bleed Discussed signs and symptoms to watch for  After 3 mo will likely dec dose to 2.5 mg bid  Cbc today       Generalized weakness   S/p hosp for pna/ PE  Was intubated Reviewed hospital records, lab results and studies in detail   Followed by SNF for rehab-did well  Waiting on [facility] to start  Encouraged pt to do her home exercises with bands while waiting to start  Goal to get as strong as possible       Diastolic dysfunction   Grade 1  Noted on recent echo No symptoms currently  Continue to follow   MIld LVH- may be due to HTN in past/previous smoking        Class 2 severe obesity due to excess calories with serious comorbidity and body mass index (BMI) of 35.0 to 35.9 in adult [facility])   BIPOLAR AFFECTIVE DISORDER   Stable Psychiatric care

## 2023-06-06 NOTE — Assessment & Plan Note (Signed)
 S/p hosp for pna/ PE  Was intubated Reviewed hospital records, lab results and studies in detail   Followed by SNF for rehab-did well  Waiting on [facility] to start  Encouraged pt to do her home exercises with bands while waiting to start  Goal to get as strong as possible

## 2023-06-06 NOTE — Assessment & Plan Note (Signed)
 2nd time  Reviewed hospital records, lab results and studies in detail   On eliquis 5 mg bid Aware of colon avm /in hopes this will not bleed Discussed signs and symptoms to watch for  After 3 mo will likely dec dose to 2.5 mg bid  Cbc today

## 2023-06-06 NOTE — Assessment & Plan Note (Signed)
 Recent hosp -with pna and PE Reviewed hospital records, lab results and studies in detail   Much improved now s/p antibiotic and steroids

## 2023-06-06 NOTE — Assessment & Plan Note (Signed)
 During hosp with hypoxia and infection Now resolved Reviewed hospital records, lab results and studies in detail

## 2023-06-06 NOTE — Assessment & Plan Note (Signed)
 Continues walker  HH for PT s/p hospitalization

## 2023-06-06 NOTE — Assessment & Plan Note (Signed)
 Grade 1  Noted on recent echo No symptoms currently  Continue to follow   MIld LVH- may be due to HTN in past/previous smoking

## 2023-06-06 NOTE — Assessment & Plan Note (Signed)
 Bmet today  S/p recent hosp for pna and PE

## 2023-06-06 NOTE — Assessment & Plan Note (Signed)
 Stable Psychiatric care

## 2023-06-06 NOTE — Assessment & Plan Note (Signed)
 bp in fair control at this time  BP Readings from Last 1 Encounters:  06/06/23 135/65   Metoprolol was changed bo carvedilol during recent hosp 3.125 mg bid /tolerating well  Most recent labs reviewed  Disc lifstyle change with low sodium diet and exercise   Has mild LVH on echo  Continue to monitor

## 2023-06-06 NOTE — Assessment & Plan Note (Signed)
 Recent hosp  PE and pna in setting of copd  Was intubated  Now resolved with steroids, eliqus and antibiotic  Back to baseline  No 02 needed

## 2023-06-06 NOTE — Assessment & Plan Note (Signed)
 Under psychiatric care

## 2023-06-06 NOTE — Assessment & Plan Note (Signed)
 Discussed how this problem influences overall health and the risks it imposes  Reviewed plan for weight loss with lower calorie diet (via better food choices (lower glycemic and portion control) along with exercise building up to or more than 30 minutes 5 days per week including some aerobic activity and strength training   Pt is interested in compounded glp-1 in future from on line weight loss program  Aware she will need to gain muscle/get stronger for this to be safe

## 2023-06-06 NOTE — Patient Instructions (Addendum)
 Today we will check kidney function and blood count today Continue eliquis 5 mg twice daily daily for 3 months / then may decrease to 2.5 mg twice daily   Start PT with [facility]  Do the exercises you already have/ use your band  Keep the home routine going    Watch for blood in stool or dark black stool  Those are signs of GI bleeding   Follow up in about 3 months

## 2023-06-06 NOTE — Assessment & Plan Note (Signed)
 Back to baseline after hosp for pna and PE -req intubation  Much improved  Reviewed hospital records, lab results and studies in detail   Reassuring exam  Back on her breztri ,duo neb and pulm care  Reassuring exam

## 2023-06-10 ENCOUNTER — Telehealth: Payer: Self-pay | Admitting: Internal Medicine

## 2023-06-10 DIAGNOSIS — M0600 Rheumatoid arthritis without rheumatoid factor, unspecified site: Secondary | ICD-10-CM

## 2023-06-10 DIAGNOSIS — G894 Chronic pain syndrome: Secondary | ICD-10-CM

## 2023-06-10 NOTE — Telephone Encounter (Signed)
 Pt called stating her right hand and wrist is in pain and nothing has helped it. Pt is requested some pain medication that would help. Pt stated she has tried everything and would need something for pain mostly. Pt is requesting a call back

## 2023-06-11 MED ORDER — HYDROCODONE-ACETAMINOPHEN 5-325 MG PO TABS
1.0000 | ORAL_TABLET | Freq: Three times a day (TID) | ORAL | 0 refills | Status: DC | PRN
Start: 2023-06-11 — End: 2023-07-02

## 2023-06-11 NOTE — Telephone Encounter (Signed)
 I spoke with Ms. Rask right hand and wrist pain is severely worse for the past. Has been getting worse after recent hospitalization. She was being treated with 5 mg Norco inpatient which helped at the time. She was also on prednisone taper but is back at baseline dose of 10 mg daily. Will send new Rx for 5 days #15 tablets or decreased as tolerated. We have f/u scheduled 5/6.

## 2023-06-19 NOTE — Progress Notes (Signed)
 Office Visit Note  Patient: Lori Orozco             Date of Birth: [birth year]           MRN: [id number]             PCP: Clemens Curt, MD Referring: Tower, Manley Seeds, MD Visit Date: 07/02/2023   Subjective:  Follow-up (Right wrist pain)   Discussed the use of AI scribe software for clinical note transcription with the patient, who gave verbal consent to proceed.  History of Present Illness   Lori Orozco is a 73 y.o. 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  She presents with severe wrist pain and hand dysfunction.  She experiences severe wrist pain described as 'insane pain' that is not relieved by painkillers. The pain is exacerbated by movement and touch, making it difficult for her to push up from a chair. She has tried icing and applying heat without relief.  She describes dysfunction in her hand, noting an inability to straighten her fingers except for her thumb and two fingers. A history of wrist surgery previously alleviated her pain, but now arthritis has set in, causing significant discomfort and swelling. She is currently taking 10 mg of prednisone  but has not noticed any reduction in swelling or pain. She also mentions being on blood thinners and has previously used Enbrel  for her rheumatoid arthritis.  She recalls a previous hand surgeon, Dr. Fanny Homme  who performed surgery on her hand, and another surgeon, Dr. Glenora Laos, who was hesitant to perform further surgery due to the complexity of her condition.  She experiences difficulty using her hand for daily activities, such as eating, due to the pain and limited mobility. She reports experiencing fever-like symptoms but is unsure of their significance. No other areas of flare-up outside of her wrist.   Previous HPI 12/24/2022 Lori Orozco is a 73 y.o. 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  She also started on Fosamax  70 mg once weekly for osteoporosis without any trouble taking the medication.  She had 1 episode of increased stiffness and swelling throughout proximal joints in her right hand this lasted for about 3 days.  She did not change her medications or take anything different to resolve this.  Saw Dr. Glenora Laos to discuss options to improve use of her right hand is waiting to hear back on recommendation or other options.  She just had a fall fell forward in the bathroom striking on her left shoulder and left side of the neck and base of skull.  Before this her last major fall was late last year has had about 2 or 3 other falls in total with suddenly losing balance.   Previous HPI 09/10/22 Lori Orozco is a 73 y.o. female here for follow up for seronegative RA on Enbrel  50 mg [place] weekly and prednisone  10 mg daily. She has significant pain in her back and legs interval workup indicating nerve impingement and is scheduled for injections with Dr. Daisey Dryer for this. MRI also with vertebral fractures probably chronic. In her arms right wrist remains painful and decreased mobility worst on the ulnar side.   Previous HPI 05/30/22 Lori Orozco is a 73 y.o. female here for follow up for seronegative RA on Enbrel  50 mg subcu weekly sulfasalazine  1000 mg twice daily  and prednisone  20 mg daily.  She was taking the medications without interruptions but started experiencing increased joint pain swelling and stiffness especially affecting her hands wrists and elbows since about 2 weeks ago.  She called about this and we increased her to 20 mg prednisone  daily up from 10 mg and she has noticed improvement in symptoms especially her elbows.  No longer swollen and is able to tolerate resting her arms on a flat surface without severe pain.  She did suffer from upper respiratory symptoms last week but these have mostly resolved without any new medication needed.    Previous  HPI 02/28/22 Lori Orozco is a 73 y.o. female here for follow up for seronegative rheumatoid arthritis on Enbrel  50 mg subcu weekly and prednisone  10 mg daily.  She had to stop taking the sulfasalazine  since about 2 weeks ago due to running out of the medication.  She had change with insurance status so having a dramatically increased cost for her medications at the moment that she is having to sort out.  She was tolerating the medicine okay.  She did not recall a noticeable difference in symptoms with starting the sulfasalazine  but while off the medication has felt noticeably worse so thinks it was doing something.  Particularly the left wrist pain has been a lot better on treatment her right wrist continues to be bad before and after.  She saw Dr. Lamon Pillow for her neck recommendation was for physical therapy but she has not pursued this since and does not feel like she would tolerate this well based on previous therapy experiences.  She does have plans for trying the nerve stimulator treatment starting later this month.      Previous HPI 12/21/21 Lori Orozco is a 73 y.o. female here for follow up for seronegative inflammatory arthritis on Enbrel  50 mg subcu weekly.  She continues to have pain and stiffness affecting her hands bilaterally although the severity of swelling has gotten notably better on the Enbrel  treatment but is far from resolved.  Her biggest concern at the moment is about her neck pain concern of cervical radiculopathy.  We checked x-ray that showed considerable multilevel degenerative disease with no acute appearing bony abnormality.  She is followed up with her spine doctor with epidural block injection tried did not find it very beneficial at all.  She has discussed radiofrequency ablation as a neck step option for radicular pain.     Previous HPI 10/09/2021 Lori Orozco is a 73 y.o. female here for follow up for seronegative inflammatory arthritis on Enbrel  50 mg [place]  weekly and prednisone  10 mg daily. She tried tapering prednisone  but had severe worsening symptoms at 5 mg dose. She increased back to 10 mg daily and symptoms improved somewhat but still a lot of pain and swelling in both hands and wrists. She has ongoing back pain and stiffness and is seeing D.r Lamon Pillow for this with known significant lumbar spine arthritis. More recently new problem with sensitivity and stinging type pain throughout both arms with even light pressure such as showering water . She also has worsening weakness in her proximal legs unable to stand without using arms, which are limited by pain.     Previous HPI 07/05/2021 Lori Orozco is a 73 y.o. female here for follow up for seronegative inflammatory arthritis on prednisone  20 mg daily dose and enbrel  50mg  subcutaneous once weekly. She has seen a large improvement in joint inflammation since starting the Enbrel . She  has pain and redness around the injection site lasting up to 3 days with each dose. Wrists continue to hurt and have decreased movement left wrist actually more symptomatic than right now. She is working with physical therapy on getting back to walking currently very weak and unstable due to illness and deconditioning. She had recent ED visit for COPD exacerbation finished antibiotics for this. No other recent infection issues.   Previous HPI 02/08/2021 Lori Orozco is a 73 y.o. female here for follow up with seronegative inflammatory arthritis on prednisone  taper at 15 mg daily dose.  She has had major medical events since her last visit.  She developed increased knee pain and swelling on the right side.  However she went to the hospital due to developing more weakness and shortness of breath CT angiogram identified bilateral pulmonary emboli.  Treatment with anticoagulation led to significant gastrointestinal bleeding from previously unknown AVM.  This was treated also in the hospital but overall protracted  complications lasted for about a month hospitalization.  During this time she experienced worsening joint pain and swelling and significant deconditioning and weakness.  She was discharged from the hospital to rehab facility she was not continued on any steroid or other anti-inflammatory treatment reports joint pain and swelling affecting numerous joints in bilateral elbows wrists fingers and knees.  This is limited any significant progress with physical therapy so far.  She called back to clinic earlier this month with the symptoms and was restarted on a prednisone  taper currently down to 15 mg daily.  She saw orthopedics clinic for right knee aspiration injection 2 days ago that is partially helping.   Previous HPI 11/15/20 Lori Orozco is a 74 y.o. female here for follow up with wrist wrist extensor tenosynovitis and presumed seronegative inflammatory arthritis also involving knee joints after starting methotrexate  15 mg PO weekly and tapering prednisone  off. Her wrist is slightly improved but remains very painful and swollen with decreased mobility. After starting methotrexate  she has noticed some episodes of dizziness and urinary retention and frequency. She stopped the prednisone  without noticing much difference in symptoms so far.     09/20/20 Lori Orozco is a 73 y.o. female here for joint pain and swelling of knees and wrists with severe tenosynovitis s/p tenosynovectomy on 09/01/20 with Dr. Lucienne Ryder.  She had been feeling in her usual health until June she recalls onset of symptoms after using a gas leaf blower at her home during which time she experienced some mild right wrist pain.  She does not recall any particular injury event at this time.  However she experienced progressive ongoing increase in pain with swelling and stiffness in her right wrist especially with decreased range of motion and pain over the dorsal aspect.  This is evaluated at orthopedics clinic with aspiration that  revealed just coagulated sample trial of steroid medication and only partially improved symptoms then continued worsening.  She was admitted to the hospital for tenosynovectomy that was performed without complication.  Intraoperatively reported extensive inflammatory tissue debridement without any evidence concerning for infection.  During this hospitalization she also developed knee effusion which was aspirated demonstrating inflammatory synovial fluid with negative microscopy and cultures.  Is now almost 3 weeks since her surgery she continues experiencing severe pain intensity swelling around the right hand and wrist and has developed some skin blistering.     Labs reviewed 08/2020 Synovial fluid knee 4,525 WBCs 90% neutrophils ANA neg RF 14.2 CCP neg ESR 37 CRP  14.1 Uric acid 4.9   07/2020 Synovial fluid wrist clotted sample negative gram stain   Review of Systems  Constitutional:  Positive for fatigue.  HENT:  Positive for mouth dryness. Negative for mouth sores.   Eyes:  Positive for dryness.  Respiratory:  Positive for shortness of breath.   Cardiovascular:  Negative for chest pain and palpitations.  Gastrointestinal:  Positive for constipation and diarrhea. Negative for blood in stool.  Endocrine: Negative for increased urination.  Genitourinary:  Negative for involuntary urination.  Musculoskeletal:  Positive for joint pain, gait problem, joint pain, joint swelling, myalgias, muscle weakness, muscle tenderness and myalgias. Negative for morning stiffness.  Skin:  Negative for color change, rash, hair loss and sensitivity to sunlight.  Allergic/Immunologic: Positive for susceptible to infections.  Neurological:  Negative for dizziness and headaches.  Hematological:  Negative for swollen glands.  Psychiatric/Behavioral:  Positive for depressed mood and sleep disturbance. The patient is nervous/anxious.     PMFS History:  Patient Active Problem List   Diagnosis Date Noted    Diastolic dysfunction 06/06/2023   Class 2 severe obesity due to excess calories with serious comorbidity and body mass index (BMI) of 35.0 to 35.9 in adult (HCC) 06/06/2023   Hypertensive urgency 05/05/2023   CKD stage 3b, GFR 30-44 ml/min (HCC) 05/05/2023   Elevated troponin 05/05/2023   Depression with anxiety 05/05/2023   Cerumen impaction 03/06/2023   Lesion of palate 03/06/2023   Mobility impaired 02/10/2023   Generalized weakness 02/08/2023   Recurrent falls 12/24/2022   Pre-operative clearance 10/30/2022   Diarrhea 10/19/2022   Cold intolerance 10/19/2022   Osteoporosis 09/10/2022   Laceration of forehead 08/29/2022   Thoracic compression fracture (HCC) 07/13/2022   Diabetes mellitus screening 07/13/2022   Lumbar herniated disc    Chronic pain 11/28/2021   Bilateral leg weakness 10/09/2021   Vitamin B12 deficiency 08/02/2021   Myofascial pain dysfunction syndrome 08/02/2021   Adverse effect of prednisone  07/11/2021   AVM (arteriovenous malformation) 01/03/2021   History of pulmonary embolism 12/08/2020   Arthritis of knee 12/06/2020   Bilateral knee pain 09/20/2020   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   High risk medication use 09/20/2020   Extensor tenosynovitis of right wrist 09/15/2020   S/P reverse total shoulder arthroplasty, 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   Lung nodule 01/02/2020   Status post arthroscopy of left shoulder 01/16/2018   Estrogen deficiency 06/28/2017   Medial epicondylitis, left elbow 04/29/2017   S/P arthroscopy of left shoulder 02/25/2017   Impingement syndrome of left shoulder 01/30/2017   Trochanteric bursitis, right hip 04/10/2016   Hypertension 09/02/2015   Urge incontinence 07/27/2015   Obesity (BMI 30-39.9) 04/28/2015   Osteoarthritis of left knee 01/28/2015   Status post total left knee replacement 01/28/2015   Vitamin D  deficiency 04/27/2014   Seasonal and perennial allergic rhinitis 06/17/2013   S/P total hip arthroplasty  03/20/2013   Medicare annual wellness visit, subsequent 11/18/2012   At high risk for falls 11/18/2012   Routine general medical examination at a health care facility 11/10/2012   Lichen sclerosus et atrophicus of the vulva 07/29/2012   Osteopenia 10/19/2011   Hypothyroidism 10/19/2011   Hereditary and idiopathic peripheral neuropathy 08/05/2009   Low back pain 08/05/2009   HAND PAIN, BILATERAL 08/05/2009   TREMOR 03/09/2008   MENOPAUSAL SYNDROME 10/09/2007   DEPRESSION, MAJOR 05/20/2007   BIPOLAR AFFECTIVE DISORDER 05/20/2007   Generalized anxiety disorder 05/20/2007   PERSONALITY DISORDER 05/20/2007   ESOPHAGEAL SPASM 05/20/2007   Diaphragmatic hernia  05/20/2007   COPD mixed type (HCC) 03/27/2007   HYPERCHOLESTEROLEMIA, PURE 12/10/2006   SYMPTOM, SYNDROME, CHRONIC FATIGUE 12/10/2006    Past Medical History:  Diagnosis Date   Allergy Hayfever   1958   Amaurosis fugax    Anemia    hx   Anxiety    Arthritis    Asthma    Cataract    Chronic fatigue syndrome    Chronic kidney disease    frequency, nephritis when 73 yrs old   COPD (chronic obstructive pulmonary disease) (HCC)    COVID-19    Depression    Diverticulitis    Emphysema of lung (HCC)    Fibromyalgia    GERD (gastroesophageal reflux disease)    occ   H/O hiatal hernia    History of bronchitis    Hyperlipidemia    Hypothyroidism    Interstitial cystitis    Irritable bowel syndrome    Lichen sclerosus    Lumbar herniated disc    Migraines    Motor nerve conduction block 11/2021   Osteoporosis 09/10/2022   Pneumonia    PONV (postoperative nausea and vomiting)    Shortness of breath    exertion    Thyroid  disease    Graves   Urinary frequency    Urinary tract infection    Vertigo     Family History  Problem Relation Age of Onset   Arthritis Mother    Hypertension Mother    Kidney disease Mother    Heart disease Mother    Stroke Mother    Cancer Father        Bladder   Arthritis Sister     Heart disease Sister    Colon cancer Other    Cancer - Lung Cousin    Arthritis Maternal Grandmother    Hearing loss Maternal Grandfather    Varicose Veins Maternal Uncle    Past Surgical History:  Procedure Laterality Date   ABDOMINAL HYSTERECTOMY     bladder sugery      CAROTID DOPPLAR     COLONOSCOPY  05/26/2010   avms- otherwise nl , re check 10y   DEXA-OSTEOPENIA     DOPPLER ECHOCARDIOGRAPHY     elbow surgery     EPICONDYLITIS     EYE SURGERY Bilateral    cataracts   FOOT SURGERY Bilateral    I & D EXTREMITY Right 09/01/2020   Procedure: TYNOSYNOVECTOMY;  Surgeon: Arnie Lao, MD;  Location: [facility] OR;  Service: Orthopedics;  Laterality: Right;   IMPLANTATION VAGAL NERVE STIMULATOR  03/2022   JOINT REPLACEMENT     KNEE SURGERY     Left cartilage   lipoma in second finger right hand     MOUTH SURGERY  08/22/2022   PLANTAR FASCIA SURGERY Left    REVERSE SHOULDER ARTHROPLASTY Left 02/02/2020   Procedure: LEFT REVERSE SHOULDER ARTHROPLASTY;  Surgeon: Jasmine Mesi, MD;  Location: [facility] OR;  Service: Orthopedics;  Laterality: Left;   ROTATOR CUFF REPAIR Left 12/2017   TEMPOROMANDIBULAR JOINT SURGERY     TONSILLECTOMY     TOTAL HIP ARTHROPLASTY Right 03/20/2013   Procedure: Right TOTAL HIP ARTHROPLASTY;  Surgeon: Timothy Ford, MD;  Location: [facility] OR;  Service: Orthopedics;  Laterality: Right;  Right Total Hip Arthroplasty   TOTAL KNEE ARTHROPLASTY Left 01/28/2015   Procedure: LEFT TOTAL KNEE ARTHROPLASTY;  Surgeon: Arnie Lao, MD;  Location: [facility];  Service: Orthopedics;  Laterality: Left;   TRIGGER FINGER RELEASE Right  TROCHANTERIC BURSA EXCISION Right 05/03/2016   Dr. Lucienne Ryder, Veryl Gottron   WRIST ARTHROSCOPY Right    ligament tear   Social History   Social History Narrative   Not on file   Immunization History  Administered Date(s) Administered   Influenza Split 11/19/2011, 11/02/2013   Influenza Whole 12/20/2006, 11/25/2008,  11/26/2009, 11/24/2010   Influenza, High Dose Seasonal PF 12/13/2016, 10/30/2022   Influenza,inj,Quad PF,6+ Mos 12/06/2015, 11/28/2021   Influenza-Unspecified 12/08/2012, 12/03/2014, 12/08/2017, 11/10/2018, 10/31/2019   Moderna Sars-Covid-2 Vaccination 06/21/2019, 07/19/2019, 01/22/2020   PFIZER(Purple Top)SARS-COV-2 Vaccination 06/04/2020, 10/30/2022   Pfizer Covid-19 Vaccine Bivalent Booster 21yrs & up 12/29/2021   Pneumococcal Conjugate-13 12/06/2015   Pneumococcal Polysaccharide-23 01/26/2002, 03/23/2008, 09/07/2010, 06/28/2017   Respiratory Syncytial Virus Vaccine,Recomb Aduvanted(Arexvy) 12/29/2021   Td 06/26/2000, 10/19/2011, 02/14/2022   Zoster Recombinant(Shingrix) 09/16/2017, 08/11/2022     Objective: Vital Signs: BP 138/80 (BP Location: Left Arm, Patient Position: Sitting, Cuff Size: Normal) Comment (BP Location): Manual  Pulse 74   Resp 14   Ht 5\' 7"  (1.702 m)   Wt 217 lb 9.6 oz (98.7 kg)   BMI 34.08 kg/m    Physical Exam Eyes:     Conjunctiva/sclera: Conjunctivae normal.  Cardiovascular:     Rate and Rhythm: Normal rate and regular rhythm.  Pulmonary:     Effort: Pulmonary effort is normal.     Breath sounds: Normal breath sounds.  Lymphadenopathy:     Cervical: No cervical adenopathy.  Skin:    General: Skin is warm and dry.  Neurological:     Mental Status: She is alert.  Psychiatric:        Mood and Affect: Mood normal.      Musculoskeletal Exam:  Shoulders full ROM no tenderness or swelling Elbows full ROM no tenderness or swelling Right wrist redness ulnar side terrible tenderness to pressure or with movement Unable to fully extend right hand fingers, nearly normal range of motion in 1st and 2nd Knees full ROM no tenderness or swelling  Investigation: No additional findings.  Imaging: No results found.   Recent Labs: Lab Results  Component Value Date   WBC 7.4 06/06/2023   HGB 12.4 06/06/2023   PLT 180.0 06/06/2023   NA 140 07/02/2023    K 4.9 07/02/2023   CL 105 07/02/2023   CO2 30 07/02/2023   GLUCOSE 107 (H) 07/02/2023   BUN 36 (H) 07/02/2023   CREATININE 1.12 (H) 07/02/2023   BILITOT 0.4 07/02/2023   ALKPHOS 40 05/04/2023   AST 13 07/02/2023   ALT 11 07/02/2023   PROT 6.4 07/02/2023   ALBUMIN 3.2 (L) 05/04/2023   CALCIUM 9.3 07/02/2023   GFRAA >60 01/29/2015   QFTBGOLDPLUS NEGATIVE 11/07/2022    Speciality Comments: No specialty comments available.  Procedures:  No procedures performed Allergies: Lithium; Tegretol [carbamazepine]; Tricyclic antidepressants; Methotrexate  derivatives; Strawberry extract; Tapentadol; Codeine; Cymbalta [duloxetine hcl]; Erythromycin; Lyrica  [pregabalin ]; Neurontin [gabapentin]; Rabeprazole sodium; Baclofen ; Duraprep [antiseptic products, misc.]; Penicillins; and Tape   Assessment / Plan:     Visit Diagnoses: Seronegative rheumatoid arthritis (HCC) - Plan: predniSONE  (DELTASONE ) 10 MG tablet, HYDROcodone -acetaminophen  (NORCO/VICODIN) 5-325 MG tablet, Sedimentation rate, C-reactive protein Rheumatoid arthritis with hand deformity 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 Severe pain and limited mobility. Current treatment with Enbrel  and prednisone  ineffective. Surgical intervention considered; consulting Dr. Fanny Homme. - Increase prednisone  to high-dose taper starting at 60 mg. - Prescribe 5 mg hydrocodone  as needed for pain. - Contact hand surgery clinic to expedite surgical evaluation. -  Recheck blood tests for systemic inflammation.  Pain in hand and wrist Severe pain in hand and wrist due to rheumatoid arthritis and hand deformity. Current medications inadequate. - Prescribe 5 mg hydrocodone  as needed for pain. - Increase prednisone  to high-dose taper starting at 60 mg. - Contact hand surgery clinic to expedite surgical evaluation.     High risk medication use - Enbrel  50 mg subcu weekly - Plan: Comprehensive metabolic panel  with GFR Rechecking metabolic panel for monitoring on long-term continued use of Enbrel  and high doses of acetaminophen    Orders: Orders Placed This Encounter  Procedures   Sedimentation rate   C-reactive protein   Comprehensive metabolic panel with GFR   Meds ordered this encounter  Medications   predniSONE  (DELTASONE ) 10 MG tablet    Sig: Take 6 tablets (60 mg total) by mouth daily with breakfast for 2 days, THEN 5 tablets (50 mg total) daily with breakfast for 2 days, THEN 4 tablets (40 mg total) daily with breakfast for 2 days, THEN 3 tablets (30 mg total) daily with breakfast for 2 days, THEN 2 tablets (20 mg total) daily with breakfast for 2 days, THEN 1 tablet (10 mg total) daily with breakfast for 2 days.    Dispense:  42 tablet    Refill:  0   HYDROcodone -acetaminophen  (NORCO/VICODIN) 5-325 MG tablet    Sig: Take 1 tablet by mouth every 12 (twelve) hours as needed for severe pain (pain score 7-10).    Dispense:  30 tablet    Refill:  0     Follow-Up Instructions: Return in about 4 weeks (around 07/30/2023) for RA on ENB/GC right wrist pain f/u 21mo.   Matt Song, MD  Note - This record has been created using [employer].  Chart creation errors have been sought, but may not always  have been located. Such creation errors do not reflect on  the standard of medical care.

## 2023-06-21 DIAGNOSIS — F3113 Bipolar disorder, current episode manic without psychotic features, severe: Secondary | ICD-10-CM

## 2023-06-21 DIAGNOSIS — N1831 Chronic kidney disease, stage 3a: Secondary | ICD-10-CM

## 2023-06-21 DIAGNOSIS — J9690 Respiratory failure, unspecified, unspecified whether with hypoxia or hypercapnia: Secondary | ICD-10-CM | POA: Diagnosis not present

## 2023-06-21 DIAGNOSIS — J440 Chronic obstructive pulmonary disease with acute lower respiratory infection: Secondary | ICD-10-CM | POA: Diagnosis not present

## 2023-06-21 DIAGNOSIS — I1310 Hypertensive heart and chronic kidney disease without heart failure, with stage 1 through stage 4 chronic kidney disease, or unspecified chronic kidney disease: Secondary | ICD-10-CM

## 2023-06-21 DIAGNOSIS — J189 Pneumonia, unspecified organism: Secondary | ICD-10-CM | POA: Diagnosis not present

## 2023-06-21 DIAGNOSIS — E1122 Type 2 diabetes mellitus with diabetic chronic kidney disease: Secondary | ICD-10-CM

## 2023-06-21 DIAGNOSIS — E039 Hypothyroidism, unspecified: Secondary | ICD-10-CM

## 2023-06-21 DIAGNOSIS — F411 Generalized anxiety disorder: Secondary | ICD-10-CM

## 2023-06-21 DIAGNOSIS — I2699 Other pulmonary embolism without acute cor pulmonale: Secondary | ICD-10-CM | POA: Diagnosis not present

## 2023-06-21 DIAGNOSIS — M0600 Rheumatoid arthritis without rheumatoid factor, unspecified site: Secondary | ICD-10-CM

## 2023-06-21 DIAGNOSIS — M797 Fibromyalgia: Secondary | ICD-10-CM

## 2023-06-24 ENCOUNTER — Telehealth: Payer: Self-pay | Admitting: *Deleted

## 2023-06-24 NOTE — Progress Notes (Unsigned)
 Complex Care Management Care Guide Note  06/24/2023 Name: JANIKA PETTIE MRN: [id number] DOB: 15-Jan-1951  LALANA DELLAROCCA is a 73 y.o. year old female who is a primary care patient of Tower, Manley Seeds, MD and is actively engaged with the care management team. I reached out to Augustina Block by phone today to assist with re-scheduling  with the RN Case Manager.  Follow up plan: Unsuccessful telephone outreach attempt made. A HIPAA compliant phone message was left for the patient providing contact information and requesting a return call.  Kandis Ormond, CMA [place]  [facility], [facility] Guide Direct Dial: [phone]  Fax: [phone] Website: [email or web address]

## 2023-06-25 ENCOUNTER — Telehealth: Payer: Self-pay | Admitting: Internal Medicine

## 2023-06-25 NOTE — Telephone Encounter (Signed)
 Pt called wanting Dr. Rodell Citrin to know that the pain medication for her right wrist is affective but does not last long. Pt would like to know if she should wait till her appt day to know what to do or come in sooner. Pt would like a call back.

## 2023-06-26 NOTE — Progress Notes (Signed)
 Complex Care Management Care Guide Note  06/26/2023 Name: Lori Orozco MRN: [id number] DOB: 09-15-1950  Lori Orozco is a 74 y.o. year old female who is a primary care patient of [facility], Manley Seeds, MD and is actively engaged with the care management team. I reached out to Augustina Block by phone today to assist with re-scheduling  with the RN Case Manager.  Follow up plan: Unsuccessful telephone outreach attempt made. A HIPAA compliant phone message was left for the patient providing contact information and requesting a return call. No further outreach attempts will be made due to inability to maintain patient contact.   Kandis Ormond, CMA [place]  [facility] Guide Direct Dial: [phone]  Fax: [phone] Website: [email or web address]

## 2023-06-26 NOTE — Progress Notes (Signed)
 Complex Care Management Care Guide Note  06/26/2023 Name: [name] MRN: [id number] DOB: Nov 17, 1950  Lori Orozco is a 73 y.o. year old female who is a primary care patient of [facility], Manley Seeds, MD and is actively engaged with the care management team. I reached out to Augustina Block by phone today to assist with re-scheduling  with the RN Case Manager.  Follow up plan: Telephone appointment with complex care management team member scheduled for:  07/05/2023  Kandis Ormond, CMA [place]  [facility] Guide Direct Dial: [phone]  Fax: [phone] Website: [email or web address]

## 2023-07-02 ENCOUNTER — Encounter: Payer: Self-pay | Admitting: Internal Medicine

## 2023-07-02 ENCOUNTER — Ambulatory Visit: Attending: Internal Medicine | Admitting: Internal Medicine

## 2023-07-02 VITALS — BP 138/80 | HR 74 | Resp 14 | Ht 67.0 in | Wt 217.6 lb

## 2023-07-02 DIAGNOSIS — Z79899 Other long term (current) drug therapy: Secondary | ICD-10-CM | POA: Diagnosis not present

## 2023-07-02 DIAGNOSIS — R296 Repeated falls: Secondary | ICD-10-CM | POA: Diagnosis present

## 2023-07-02 DIAGNOSIS — G894 Chronic pain syndrome: Secondary | ICD-10-CM

## 2023-07-02 DIAGNOSIS — Z7952 Long term (current) use of systemic steroids: Secondary | ICD-10-CM

## 2023-07-02 DIAGNOSIS — R29898 Other symptoms and signs involving the musculoskeletal system: Secondary | ICD-10-CM | POA: Diagnosis present

## 2023-07-02 DIAGNOSIS — M0600 Rheumatoid arthritis without rheumatoid factor, unspecified site: Secondary | ICD-10-CM

## 2023-07-02 DIAGNOSIS — G609 Hereditary and idiopathic neuropathy, unspecified: Secondary | ICD-10-CM

## 2023-07-02 DIAGNOSIS — M8000XS Age-related osteoporosis with current pathological fracture, unspecified site, sequela: Secondary | ICD-10-CM

## 2023-07-02 MED ORDER — PREDNISONE 10 MG PO TABS: ORAL_TABLET | ORAL | 0 refills | Status: AC

## 2023-07-02 MED ORDER — HYDROCODONE-ACETAMINOPHEN 5-325 MG PO TABS
1.0000 | ORAL_TABLET | Freq: Two times a day (BID) | ORAL | 0 refills | Status: DC | PRN
Start: 2023-07-02 — End: 2023-07-30

## 2023-07-03 ENCOUNTER — Other Ambulatory Visit: Payer: Self-pay

## 2023-07-03 DIAGNOSIS — M0600 Rheumatoid arthritis without rheumatoid factor, unspecified site: Secondary | ICD-10-CM

## 2023-07-03 DIAGNOSIS — Z79899 Other long term (current) drug therapy: Secondary | ICD-10-CM

## 2023-07-03 LAB — COMPREHENSIVE METABOLIC PANEL WITH GFR
AG Ratio: 1.6 (calc) (ref 1.0–2.5)
ALT: 11 U/L (ref 6–29)
AST: 13 U/L (ref 10–35)
Albumin: 3.9 g/dL (ref 3.6–5.1)
Alkaline phosphatase (APISO): 44 U/L (ref 37–153)
BUN/Creatinine Ratio: 32 (calc) — ABNORMAL HIGH (ref 6–22)
BUN: 36 mg/dL — ABNORMAL HIGH (ref 7–25)
CO2: 30 mmol/L (ref 20–32)
Calcium: 9.3 mg/dL (ref 8.6–10.4)
Chloride: 105 mmol/L (ref 98–110)
Creat: 1.12 mg/dL — ABNORMAL HIGH (ref 0.60–1.00)
Globulin: 2.5 g/dL (ref 1.9–3.7)
Glucose, Bld: 107 mg/dL — ABNORMAL HIGH (ref 65–99)
Potassium: 4.9 mmol/L (ref 3.5–5.3)
Sodium: 140 mmol/L (ref 135–146)
Total Bilirubin: 0.4 mg/dL (ref 0.2–1.2)
Total Protein: 6.4 g/dL (ref 6.1–8.1)
eGFR: 52 mL/min/{1.73_m2} — ABNORMAL LOW (ref >=60)

## 2023-07-03 LAB — C-REACTIVE PROTEIN: CRP: 23.8 mg/L — ABNORMAL HIGH (ref <8.0)

## 2023-07-03 LAB — SEDIMENTATION RATE: Sed Rate: 46 mm/h — ABNORMAL HIGH (ref 0–30)

## 2023-07-03 NOTE — Telephone Encounter (Signed)
 Refill request received via fax from [facility] for Enbrel   Last Fill: 04/22/2023  Labs: 06/06/2023 CBC RBC 3.86 RDW 16.1  07/02/2023 CMP  Glucose 107 BUN 36 Creat 1.12 eGFR 52 BUN/Creatinine Ratio 32  TB Gold: 11/07/2022 Negative   Next Visit: 07/30/2023  Last Visit: 07/02/2023  [id number] rheumatoid arthritis (HCC)   Current Dose per office note 07/02/2023: Enbrel  50 mg subcu weekly   Okay to refill Enbrel ?

## 2023-07-04 NOTE — Telephone Encounter (Signed)
 Received a refill request from the pharmacy again. Please advise.

## 2023-07-05 ENCOUNTER — Telehealth: Payer: Self-pay

## 2023-07-05 MED ORDER — ENBREL SURECLICK 50 MG/ML ~~LOC~~ SOAJ: 50.0000 mg | SUBCUTANEOUS | 0 refills | Status: DC

## 2023-07-09 ENCOUNTER — Other Ambulatory Visit: Payer: Self-pay | Admitting: Internal Medicine

## 2023-07-09 DIAGNOSIS — Z79899 Other long term (current) drug therapy: Secondary | ICD-10-CM

## 2023-07-09 DIAGNOSIS — M0600 Rheumatoid arthritis without rheumatoid factor, unspecified site: Secondary | ICD-10-CM

## 2023-07-09 NOTE — Telephone Encounter (Signed)
 Pt called stating she received her Enbrel  medication and it had only one months worth of medication. Pt stated she only had 2 more pens left and she will need more. Pt stated he usual sends a 3 months supply and was confused why he did not this time. Pt would like a call back at [phone].

## 2023-07-09 NOTE — Telephone Encounter (Signed)
 Advised the patient was not sure why we sent a one month, may have been the way the pharmacy sent it. Patient states she just did not want to run out of medication. Advised the patient I would go ahead and work up a three month prescription for the patient and send it to Dr. Rodell Citrin and see what he says but I could not garuntee Dr. Rodell Citrin would sign off on the prescription because of how early it is.   Last Fill: 07/05/2023  Labs: 07/02/2023 CMP w/ GFR Glucose 107 BUN 36 Creat 52 BUN/Creatinine Ratio 32  06/06/2023 CBC w/ Diff RBC 3.86 RDW 16.1  TB Gold: 11/07/2022 Negative   Next Visit: 07/30/2023  Last Visit: 07/02/2023  [id number] rheumatoid arthritis (HCC)   Current Dose per office note 07/02/2023: Enbrel  50 mg subcu weekly   Okay to refill Enbrel ?

## 2023-07-10 ENCOUNTER — Ambulatory Visit: Payer: Self-pay | Admitting: Internal Medicine

## 2023-07-10 MED ORDER — ENBREL SURECLICK 50 MG/ML ~~LOC~~ SOAJ: 50.0000 mg | SUBCUTANEOUS | 0 refills | Status: DC

## 2023-07-11 ENCOUNTER — Telehealth: Payer: Self-pay | Admitting: Internal Medicine

## 2023-07-11 NOTE — Telephone Encounter (Signed)
 Pt is requesting Dr. Rodell Citrin to send or call Dr. Eugene Hertz (hand surgeon at [facility]) in regards of her hands for him to do hand surgery. Fax number is [phone] and put ATTN: Dr. Eugene Hertz on it

## 2023-07-11 NOTE — Telephone Encounter (Signed)
 Per last office visit note, Contact hand surgery clinic to expedite surgical evaluation. Please advise.

## 2023-07-15 ENCOUNTER — Ambulatory Visit: Payer: Medicare Other

## 2023-07-15 ENCOUNTER — Other Ambulatory Visit: Payer: Self-pay | Admitting: Family Medicine

## 2023-07-15 VITALS — BP 138/80 | Ht 67.0 in | Wt 211.0 lb

## 2023-07-15 DIAGNOSIS — Z5320 Procedure and treatment not carried out because of patient's decision for unspecified reasons: Secondary | ICD-10-CM | POA: Diagnosis not present

## 2023-07-15 DIAGNOSIS — Z0000 Encounter for general adult medical examination without abnormal findings: Secondary | ICD-10-CM | POA: Diagnosis not present

## 2023-07-15 NOTE — Patient Instructions (Signed)
 Lori Orozco , Thank you for taking time out of your busy schedule to complete your Annual Wellness Visit with me. I enjoyed our conversation and look forward to speaking with you again next year. I, as well as your care team,  appreciate your ongoing commitment to your health goals. Please review the following plan we discussed and let me know if I can assist you in the future. Your Game plan/ To Do List    Referrals: If you haven't heard from the office you've been referred to, please reach out to them at the phone provided.   Follow up Visits: Next Medicare AWV with our clinical staff: 07/16/2024   Have you seen your provider in the last 6 months (3 months if uncontrolled diabetes)? Yes Next Office Visit with your provider:   Clinician Recommendations:  Aim for 30 minutes of exercise or brisk walking, 6-8 glasses of water , and 5 servings of fruits and vegetables each day.       This is a list of the screening recommended for you and due dates:  Health Maintenance  Topic Date Due   COVID-19 Vaccine (3 - Pfizer risk series) 02/07/2026*   Cologuard (Stool DNA test)  07/26/2023   Flu Shot  09/27/2023   Mammogram  02/04/2024   Screening for Lung Cancer  05/04/2024   Medicare Annual Wellness Visit  07/14/2024   DTaP/Tdap/Td vaccine (4 - Tdap) 02/15/2032   Pneumonia Vaccine  Completed   DEXA scan (bone density measurement)  Completed   Hepatitis C Screening  Completed   Zoster (Shingles) Vaccine  Completed   HPV Vaccine  Aged Out   Meningitis B Vaccine  Aged Out   Colon Cancer Screening  Discontinued  *Topic was postponed. The date shown is not the original due date.    Advanced directives: (In Chart) A copy of your advanced directives are scanned into your chart should your provider ever need it. Advance Care Planning is important because it:  [x]  Makes sure you receive the medical care that is consistent with your values, goals, and preferences  [x]  It provides guidance to your  family and loved ones and reduces their decisional burden about whether or not they are making the right decisions based on your wishes.  Follow the link provided in your after visit summary or read over the paperwork we have mailed to you to help you started getting your Advance Directives in place. If you need assistance in completing these, please reach out to us  so that we can help you!  See attachments for Preventive Care and Fall Prevention Tips.

## 2023-07-15 NOTE — Progress Notes (Signed)
 Because this visit was a virtual/telehealth visit,  certain criteria was not obtained, such a blood pressure, CBG if applicable, and timed get up and go. Any medications not marked as "taking" were not mentioned during the medication reconciliation part of the visit. Any vitals not documented were not able to be obtained due to this being a telehealth visit or patient was unable to self-report a recent blood pressure reading due to a lack of equipment at home via telehealth. Vitals that have been documented are verbally provided by the patient.   This visit was performed by a medical professional under my direct supervision. I was immediately available for consultation/collaboration. I have reviewed and agree with the Annual Wellness Visit documentation.  Subjective:   Lori Orozco is a 73 y.o. who presents for a Medicare Wellness preventive visit.  As a reminder, Annual Wellness Visits don't include a physical exam, and some assessments may be limited, especially if this visit is performed virtually. We may recommend an in-person follow-up visit with your provider if needed.  Visit Complete: Virtual I connected with  Lori Orozco on 07/15/23 by a audio enabled telemedicine application and verified that I am speaking with the correct person using two identifiers.  Patient Location: Home  Provider Location: Home Office  I discussed the limitations of evaluation and management by telemedicine. The patient expressed understanding and agreed to proceed.  Vital Signs: Because this visit was a virtual/telehealth visit, some criteria may be missing or patient reported. Any vitals not documented were not able to be obtained and vitals that have been documented are patient reported.  VideoDeclined- This patient declined Librarian, academic. Therefore the visit was completed with audio only.  Persons Participating in Visit: Patient.  AWV Questionnaire: Yes:  Patient Medicare AWV questionnaire was completed by the patient on 07/11/2023; I have confirmed that all information answered by patient is correct and no changes since this date.  Cardiac Risk Factors include: advanced age (>25men, >96 women);dyslipidemia;hypertension;obesity (BMI >30kg/m2);Other (see comment), Risk factor comments: copd     Objective:     Today's Vitals   07/15/23 1314  BP: 138/80  Weight: 211 lb (95.7 kg)  Height: 5\' 7"  (1.702 m)   Body mass index is 33.05 kg/m.     07/15/2023    1:13 PM 05/08/2023    3:00 PM 12/26/2022    1:55 PM 08/23/2022   12:35 PM 07/10/2021    3:52 PM 09/08/2020    3:18 PM 09/01/2020    5:55 PM  Advanced Directives  Does Patient Have a Medical Advance Directive? Yes Yes Yes Yes Yes No No  Type of Advance Directive Living will Living will Living will Healthcare Power of Rafael Hernandez;Living will Living will    Does patient want to make changes to medical advance directive?  Yes (Inpatient - patient requests chaplain consult to change a medical advance directive)  No - Patient declined Yes (Inpatient - patient defers changing a medical advance directive and declines information at this time)    Copy of Healthcare Power of Attorney in Chart?    Yes - validated most recent copy scanned in chart (See row information)     Would patient like information on creating a medical advance directive?      No - Patient declined No - Patient declined    Current Medications (verified) Outpatient Encounter Medications as of 07/15/2023  Medication Sig   acetaminophen  (TYLENOL ) 500 MG tablet Take 500 mg by mouth every 6 (  six) hours as needed.   alendronate  (FOSAMAX ) 70 MG tablet TAKE 1 TABLET BY MOUTH ONCE A WEEK. TAKE WITH A FULL GLASS OF WATER  ON AN EMPTY STOMACH.   Black Cohosh  40 MG CAPS Take 1 capsule (40 mg total) by mouth daily.   Budeson-Glycopyrrol-Formoterol  (BREZTRI  AEROSPHERE) 160-9-4.8 MCG/ACT AERO Inhale 2 puffs into the lungs 2 (two) times daily.    buPROPion  (WELLBUTRIN  XL) 150 MG 24 hr tablet Take 150 mg by mouth daily.   busPIRone  (BUSPAR ) 15 MG tablet Take 15 mg by mouth 2 (two) times daily before a meal.   carvedilol  (COREG ) 3.125 MG tablet Take 1 tablet (3.125 mg total) by mouth 2 (two) times daily with a meal.   cholecalciferol  (VITAMIN D ) 25 MCG (1000 UNIT) tablet Take 2,000 Units by mouth daily. 2 tablets daily   clobetasol  cream (TEMOVATE ) 0.05 % APPLY SMALL AMOUNT TO AFFECTED AREAS TWO TO THREE TIMES PER WEEK   cyanocobalamin  2000 MCG tablet Take 500 mcg by mouth daily.   diclofenac  Sodium (VOLTAREN ) 1 % GEL Apply 2 g topically 2 (two) times daily as needed (pain).   diphenhydrAMINE  (BENADRYL ) 25 MG tablet Take 25 mg by mouth every 6 (six) hours as needed for allergies.   divalproex  (DEPAKOTE  ER) 500 MG 24 hr tablet Take 500 mg by mouth at bedtime.    docusate sodium  (COLACE) 100 MG capsule Take 1 capsule (100 mg total) by mouth 2 (two) times daily as needed for mild constipation.   etanercept  (ENBREL  SURECLICK) 50 MG/ML injection Inject 50 mg into the skin once a week.   famotidine  (PEPCID ) 20 MG tablet Take 1 tablet (20 mg total) by mouth daily.   fluticasone  (FLONASE ) 50 MCG/ACT nasal spray PLACE 2 SPRAYS INTO THE NOSE DAILY.   HYDROcodone -acetaminophen  (NORCO/VICODIN) 5-325 MG tablet Take 1 tablet by mouth every 12 (twelve) hours as needed for severe pain (pain score 7-10).   hydrocortisone cream 1 % Apply 1 Application topically 2 (two) times daily as needed for itching.   ipratropium-albuterol  (DUONEB) 0.5-2.5 (3) MG/3ML SOLN Take 3 mLs by nebulization 2 (two) times daily.   ketotifen  (ZADITOR ) 0.025 % ophthalmic solution Place 1 drop into both eyes 2 (two) times daily as needed (allergies).   levothyroxine  (SYNTHROID ) 50 MCG tablet TAKE 1 TABLET BY MOUTH DAILY BEFORE BREAKFAST   LORazepam  (ATIVAN ) 1 MG tablet Take 1 tablet (1 mg total) by mouth at bedtime as needed for anxiety or sleep.   metoprolol  succinate (TOPROL -XL) 25  MG 24 hr tablet Take 12.5 mg by mouth daily.   mirabegron  ER (MYRBETRIQ ) 25 MG TB24 tablet Take 1 tablet (25 mg total) by mouth in the morning and at bedtime.   mirtazapine  (REMERON ) 30 MG tablet Take 30 mg by mouth at bedtime.    ondansetron  (ZOFRAN -ODT) 4 MG disintegrating tablet Take 1 tablet (4 mg total) by mouth every 8 (eight) hours as needed for nausea or vomiting.   predniSONE  (DELTASONE ) 10 MG tablet TAKE 1 TABLET (10 MG TOTAL) BY MOUTH DAILY WITH BREAKFAST.   Rhubarb (ESTROVEN COMPLETE) 4 MG TABS Take 4 mg by mouth daily.   sertraline  (ZOLOFT ) 100 MG tablet Take 100 mg by mouth daily.    simvastatin  (ZOCOR ) 20 MG tablet TAKE 1 TABLET BY MOUTH AT BEDTIME   Spacer/Aero-Holding Chambers (AEROCHAMBER PLUS) inhaler Use with inhaler   thiamine  (VITAMIN B-1) 100 MG tablet Take 1 tablet (100 mg total) by mouth daily.   traMADol  (ULTRAM ) 50 MG tablet Take 1 tablet (50 mg  total) by mouth every 6 (six) hours as needed for moderate pain (pain score 4-6).   apixaban  (ELIQUIS ) 5 MG TABS tablet Take 1 tablet (5 mg total) by mouth 2 (two) times daily for 4 days.   No facility-administered encounter medications on file as of 07/15/2023.    Allergies (verified) Lithium; Tegretol [carbamazepine]; Tricyclic antidepressants; Methotrexate  derivatives; Strawberry extract; Tapentadol; Codeine; Cymbalta [duloxetine hcl]; Erythromycin; Lyrica  [pregabalin ]; Neurontin [gabapentin]; Rabeprazole sodium; Baclofen ; Duraprep [antiseptic products, misc.]; Penicillins; and Tape   History: Past Medical History:  Diagnosis Date   Allergy Hayfever   1958   Amaurosis fugax    Anemia    hx   Anxiety    Arthritis    Asthma    Cataract    Chronic fatigue syndrome    Chronic kidney disease    frequency, nephritis when 73 yrs old   COPD (chronic obstructive pulmonary disease) (HCC)    COVID-19    Depression    Diverticulitis    Emphysema of lung (HCC)    Fibromyalgia    GERD (gastroesophageal reflux disease)     occ   H/O hiatal hernia    History of bronchitis    Hyperlipidemia    Hypothyroidism    Interstitial cystitis    Irritable bowel syndrome    Lichen sclerosus    Lumbar herniated disc    Migraines    Motor nerve conduction Orozco 11/2021   Osteoporosis 09/10/2022   Pneumonia    PONV (postoperative nausea and vomiting)    Shortness of breath    exertion    Thyroid  disease    Graves   Urinary frequency    Urinary tract infection    Vertigo    Past Surgical History:  Procedure Laterality Date   ABDOMINAL HYSTERECTOMY     bladder sugery      CAROTID DOPPLAR     COLONOSCOPY  05/26/2010   avms- otherwise nl , re check 10y   DEXA-OSTEOPENIA     DOPPLER ECHOCARDIOGRAPHY     elbow surgery     EPICONDYLITIS     EYE SURGERY Bilateral    cataracts   FOOT SURGERY Bilateral    I & D EXTREMITY Right 09/01/2020   Procedure: TYNOSYNOVECTOMY;  Surgeon: Arnie Lao, MD;  Location: [facility] OR;  Service: Orthopedics;  Laterality: Right;   IMPLANTATION VAGAL NERVE STIMULATOR  03/2022   JOINT REPLACEMENT     KNEE SURGERY     Left cartilage   lipoma in second finger right hand     MOUTH SURGERY  08/22/2022   PLANTAR FASCIA SURGERY Left    REVERSE SHOULDER ARTHROPLASTY Left 02/02/2020   Procedure: LEFT REVERSE SHOULDER ARTHROPLASTY;  Surgeon: Jasmine Mesi, MD;  Location: [facility] OR;  Service: Orthopedics;  Laterality: Left;   ROTATOR CUFF REPAIR Left 12/2017   TEMPOROMANDIBULAR JOINT SURGERY     TONSILLECTOMY     TOTAL HIP ARTHROPLASTY Right 03/20/2013   Procedure: Right TOTAL HIP ARTHROPLASTY;  Surgeon: Timothy Ford, MD;  Location: [facility] OR;  Service: Orthopedics;  Laterality: Right;  Right Total Hip Arthroplasty   TOTAL KNEE ARTHROPLASTY Left 01/28/2015   Procedure: LEFT TOTAL KNEE ARTHROPLASTY;  Surgeon: Arnie Lao, MD;  Location: [facility] ORS;  Service: Orthopedics;  Laterality: Left;   TRIGGER FINGER RELEASE Right    TROCHANTERIC BURSA EXCISION Right 05/03/2016   Dr.  Norberto Bear   WRIST ARTHROSCOPY Right    ligament tear   Family History  Problem Relation Age of Onset  Arthritis Mother    Hypertension Mother    Kidney disease Mother    Heart disease Mother    Stroke Mother    Cancer Father        Bladder   Arthritis Sister    Heart disease Sister    Colon cancer Other    Cancer - Lung Cousin    Arthritis Maternal Grandmother    Hearing loss Maternal Grandfather    Varicose Veins Maternal Uncle    Social History   Socioeconomic History   Marital status: Single    Spouse name: Not on file   Number of children: Not on file   Years of education: Not on file   Highest education level: 12th grade  Occupational History   Not on file  Tobacco Use   Smoking status: Former    Current packs/day: 0.00    Average packs/day: 2.0 packs/day for 43.0 years (86.0 ttl pk-yrs)    Types: Cigarettes    Start date: 02/26/1970    Quit date: 02/26/2013    Years since quitting: 10.3    Passive exposure: Never   Smokeless tobacco: Never   Tobacco comments:    Vaping occasionally  Vaping Use   Vaping status: Former   Devices: haven't used in 8 months  Substance and Sexual Activity   Alcohol use: No   Drug use: No   Sexual activity: Not Currently  Other Topics Concern   Not on file  Social History Narrative   Not on file   Social Drivers of Health   Financial Resource Strain: Medium Risk (07/15/2023)   Overall Financial Resource Strain (CARDIA)    Difficulty of Paying Living Expenses: Somewhat hard  Food Insecurity: No Food Insecurity (07/15/2023)   Hunger Vital Sign    Worried About Running Out of Food in the Last Year: Never true    Ran Out of Food in the Last Year: Never true  Recent Concern: Food Insecurity - Food Insecurity Present (06/02/2023)   Hunger Vital Sign    Worried About Running Out of Food in the Last Year: Often true    Ran Out of Food in the Last Year: Often true  Transportation Needs: No Transportation Needs  (07/15/2023)   PRAPARE - Administrator, Civil Service (Medical): No    Lack of Transportation (Non-Medical): No  Physical Activity: Insufficiently Active (07/15/2023)   Exercise Vital Sign    Days of Exercise per Week: 2 days    Minutes of Exercise per Session: 40 min  Stress: Stress Concern Present (07/15/2023)   [employer] of Occupational Health - Occupational Stress Questionnaire    Feeling of Stress : To some extent  Social Connections: Unknown (07/15/2023)   Social Connection and Isolation Panel [NHANES]    Frequency of Communication with Friends and Family: Twice a week    Frequency of Social Gatherings with Friends and Family: Once a week    Attends Religious Services: Patient declined    Database administrator or Organizations: No    Attends Engineer, structural: Patient unable to answer    Marital Status: Never married    Tobacco Counseling Counseling given: Not Answered Tobacco comments: Vaping occasionally    Clinical Intake:  Pre-visit preparation completed: Yes  Pain : No/denies pain     BMI - recorded: 33.05 Nutritional Status: BMI > 30  Obese Nutritional Risks: None Diabetes: No  Lab Results  Component Value Date   HGBA1C 5.6 05/05/2023   HGBA1C  5.2 07/13/2022   HGBA1C 5.4 07/11/2021     How often do you need to have someone help you when you read instructions, pamphlets, or other written materials from your doctor or pharmacy?: 1 - Never     Information entered by :: [employer]   Activities of Daily Living     07/11/2023    2:56 AM 05/08/2023    3:00 PM  In your present state of health, do you have any difficulty performing the following activities:  Hearing? 0 0  Vision? 0 0  Difficulty concentrating or making decisions? 1 0  Walking or climbing stairs? 1   Dressing or bathing? 1   Doing errands, shopping? 1 1  Preparing Food and eating ? Y   Using the Toilet? N   In the past six months, have you  accidently leaked urine? N   Do you have problems with loss of bowel control? N   Managing your Medications? N   Managing your Finances? Y   Housekeeping or managing your Housekeeping? Y     Patient Care Team: Tower, Manley Seeds, MD as PCP - Vernard Goldberg, MD as Consulting Physician (Ophthalmology) Viveca Grist, MD as Referring Physician (Psychiatry) Ana Balling, MD as Consulting Physician (Obstetrics and Gynecology) Faustina Hood, MD as Consulting Physician (Pulmonary Disease) Arnie Lao, MD as Consulting Physician (Orthopedic Surgery) Timothy Ford, MD as Consulting Physician (Orthopedic Surgery) Bridget Campion, MD as Consulting Physician (Physical Medicine and Rehabilitation) Zenobia Hila, MD as Referring Physician (Otolaryngology) Fidencio Hue, PhD as Referring Physician (Psychology) Jonathan Neighbor, [facility] (Inactive) as Pharmacist (Pharmacist)  Indicate any recent Medical Services you may have received from other than [facility] providers in the past year (date may be approximate).     Assessment:    This is a routine wellness examination for [place].  Hearing/Vision screen Hearing Screening - Comments:: No difficulties hearing Vision Screening - Comments:: Patient wears reades   Goals Addressed   None    Depression Screen     07/15/2023    1:18 PM 06/06/2023   11:06 AM 02/08/2023   11:15 AM 10/30/2022    3:05 PM 08/23/2022   12:33 PM 07/13/2022   11:41 AM 08/02/2021    2:02 PM  PHQ 2/9 Scores  PHQ - 2 Score 2 2 5 6  0 6 3  PHQ- 9 Score 4 7 18 24  0 18     Fall Risk     07/15/2023    1:17 PM 07/11/2023    2:56 AM 06/06/2023   11:06 AM 02/08/2023   11:15 AM 12/19/2022    3:23 PM  Fall Risk   Falls in the past year? 1 1 1 1 1   Number falls in past yr: 0 1 1 1 1   Injury with Fall? 0 0 0 0 0  Comment     patient reports bruising  Risk for fall due to : No Fall Risks  History of fall(s) History of fall(s) History of fall(s)  Follow  up Falls prevention discussed;Falls evaluation completed  Falls evaluation completed Falls evaluation completed Education provided;Falls prevention discussed    MEDICARE RISK AT HOME:  Medicare Risk at Home Any stairs in or around the home?: (Patient-Rptd) Yes If so, are there any without handrails?: (Patient-Rptd) Yes Home free of loose throw rugs in walkways, pet beds, electrical cords, etc?: (Patient-Rptd) Yes Adequate lighting in your home to reduce risk of falls?: (Patient-Rptd) Yes Life alert?: (Patient-Rptd) Yes Use of  a cane, walker or w/c?: (Patient-Rptd) Yes Grab bars in the bathroom?: (Patient-Rptd) Yes Shower chair or bench in shower?: (Patient-Rptd) No Elevated toilet seat or a handicapped toilet?: (Patient-Rptd) Yes  TIMED UP AND GO:  Was the test performed?  No  Cognitive Function: 6CIT completed    06/30/2018    8:20 AM 06/18/2017    8:05 AM 05/09/2016    1:42 PM  MMSE - Mini Mental State Exam  Orientation to time 5 5 5   Orientation to Place 5 5 5   Registration 3 3 3   Attention/ Calculation 0 0 0  Recall 3 1 3   Recall-comments  unable to recall 2 of 3 words   Language- name 2 objects 0 0 0  Language- repeat 1 1 1   Language- follow 3 step command 0 3 3  Language- read & follow direction 0 0 0  Write a sentence 0 0 0  Copy design 0 0 0  Total score 17 18 20         07/15/2023    1:16 PM 08/23/2022   12:39 PM 07/10/2021    3:55 PM  6CIT Screen  What Year? 0 points 0 points 0 points  What month? 0 points 0 points 0 points  What time? 0 points 0 points 0 points  Count back from 20 0 points 0 points 0 points  Months in reverse 0 points 0 points 0 points  Repeat phrase 0 points 0 points 0 points  Total Score 0 points 0 points 0 points    Immunizations Immunization History  Administered Date(s) Administered   Influenza Split 11/19/2011, 11/02/2013   Influenza Whole 12/20/2006, 11/25/2008, 11/26/2009, 11/24/2010   Influenza, High Dose Seasonal PF  12/13/2016, 10/30/2022   Influenza,inj,Quad PF,6+ Mos 12/06/2015, 11/28/2021   Influenza-Unspecified 12/08/2012, 12/03/2014, 12/08/2017, 11/10/2018, 10/31/2019   Moderna Sars-Covid-2 Vaccination 06/21/2019, 07/19/2019, 01/22/2020   PFIZER(Purple Top)SARS-COV-2 Vaccination 06/04/2020, 10/30/2022   Pfizer Covid-19 Vaccine Bivalent Booster 67yrs & up 12/29/2021   Pneumococcal Conjugate-13 12/06/2015   Pneumococcal Polysaccharide-23 01/26/2002, 03/23/2008, 09/07/2010, 06/28/2017   Respiratory Syncytial Virus Vaccine,Recomb Aduvanted(Arexvy) 12/29/2021   Td 06/26/2000, 10/19/2011, 02/14/2022   Zoster Recombinant(Shingrix) 09/16/2017, 08/11/2022    Screening Tests Health Maintenance  Topic Date Due   COVID-19 Vaccine (3 - Pfizer risk series) 02/07/2026 (Originally 11/28/2022)   Fecal DNA (Cologuard)  07/26/2023   INFLUENZA VACCINE  09/27/2023   MAMMOGRAM  02/04/2024   Lung Cancer Screening  05/04/2024   Medicare Annual Wellness (AWV)  07/14/2024   DTaP/Tdap/Td (4 - Tdap) 02/15/2032   Pneumonia Vaccine 35+ Years old  Completed   DEXA SCAN  Completed   Hepatitis C Screening  Completed   Zoster Vaccines- Shingrix  Completed   HPV VACCINES  Aged Out   Meningococcal B Vaccine  Aged Out   Colonoscopy  Discontinued    Health Maintenance  There are no preventive care reminders to display for this patient. Health Maintenance Items Addressed:declined lung cancer screening   Additional Screening:  Vision Screening: Recommended annual ophthalmology exams for early detection of glaucoma and other disorders of the eye.  Dental Screening: Recommended annual dental exams for proper oral hygiene  Community Resource Referral / Chronic Care Management: CRR required this visit?  No   CCM required this visit?  No   Plan:    I have personally reviewed and noted the following in the patient's chart:   Medical and social history Use of alcohol, tobacco or illicit drugs  Current medications  and supplements including opioid prescriptions.  Patient is not currently taking opioid prescriptions. Functional ability and status Nutritional status Physical activity Advanced directives List of other physicians Hospitalizations, surgeries, and ER visits in previous 12 months Vitals Screenings to include cognitive, depression, and falls Referrals and appointments  In addition, I have reviewed and discussed with patient certain preventive protocols, quality metrics, and best practice recommendations. A written personalized care plan for preventive services as well as general preventive health recommendations were provided to patient.   Freeda Jerry, [place]   07/15/2023   After Visit Summary: (MyChart) Due to this being a telephonic visit, the after visit summary with patients personalized plan was offered to patient via MyChart   Notes: Nothing significant to report at this time.

## 2023-07-15 NOTE — Telephone Encounter (Signed)
 Copied from CRM [id number]. Topic: Clinical - Medication Refill >> Jul 15, 2023  1:26 PM Baldo Levan wrote: Medication: ondansetron  (ZOFRAN -ODT) 4 MG disintegrating tablet  Has the patient contacted their pharmacy? Yes (Agent: If no, request that the patient contact the pharmacy for the refill. If patient does not wish to contact the pharmacy document the reason why and proceed with request.) (Agent: If yes, when and what did the pharmacy advise?)  This is the patient's preferred pharmacy:  [id number] [place], [place] - [address] [place] [address] Phone: [phone] Fax: [phone]   Is this the correct pharmacy for this prescription? Yes If no, delete pharmacy and type the correct one.   Has the prescription been filled recently? No  Is the patient out of the medication? Yes  Has the patient been seen for an appointment in the last year OR does the patient have an upcoming appointment? Yes  Can we respond through MyChart? Yes  Agent: Please be advised that Rx refills may take up to 3 business days. We ask that you follow-up with your pharmacy.

## 2023-07-16 ENCOUNTER — Other Ambulatory Visit: Payer: Self-pay | Admitting: Family Medicine

## 2023-07-16 MED ORDER — ONDANSETRON 4 MG PO TBDP: 4.0000 mg | ORAL_TABLET | Freq: Three times a day (TID) | ORAL | 0 refills | Status: AC | PRN

## 2023-07-16 NOTE — Telephone Encounter (Signed)
 CPE scheduled 07/17/23 Last filled on 02/10/21 # 20 tabs/ 1 refill

## 2023-07-16 NOTE — Progress Notes (Signed)
 Office Visit Note  Patient: Lori Orozco             Date of Birth: [birth year]           MRN: [id number]             PCP: Clemens Curt, MD Referring: Tower, Manley Seeds, MD Visit Date: 07/30/2023   Subjective:  Follow-up (Patient states she has had changes these past few days. )   History of Present Illness:   Discussed the use of AI scribe software for clinical note transcription with the patient, who gave verbal consent to proceed.  History of Present Illness   Lori Orozco is a 73 y.o. 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  She presents with worsening muscle and bone pain.  She has been experiencing worsening muscle and bone pain over the past few days, significantly impacting her mobility and sleep. She describes difficulty getting out of her chair due to weakness and pain, and states that the pain wakes her up at night. Her muscles feel like they are 'grabbing' and causing significant discomfort, particularly in her groin area, requiring her to hold onto the bed to turn over. She took hydrocodone  last night to alleviate the pain.  Her symptoms have been worsening over the past week, despite a temporary improvement with a higher dose of prednisone . No fever, but she reports experiencing chills. She is currently taking 10 mg of prednisone . Her rheumatoid arthritis has been previously managed with Enbrel , which initially worked well, but now her symptoms have returned.  She reports ongoing issues with her hands, particularly her right hand, which is painful and less functional. The left hand is better than the right but still problematic. She has a history of shoulder replacement and a pinched nerve in her neck, which causes pain when lying back.   Previous HPI 07/02/2023 Lori Orozco is a 73 y.o. 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   She presents with severe wrist pain and hand dysfunction.   She experiences severe wrist pain described as 'insane pain' that is not relieved by painkillers. The pain is exacerbated by movement and touch, making it difficult for her to push up from a chair. She has tried icing and applying heat without relief.   She describes dysfunction in her hand, noting an inability to straighten her fingers except for her thumb and two fingers. A history of wrist surgery previously alleviated her pain, but now arthritis has set in, causing significant discomfort and swelling. She is currently taking 10 mg of prednisone  but has not noticed any reduction in swelling or pain. She also mentions being on blood thinners and has previously used Enbrel  for her rheumatoid arthritis.   She recalls a previous hand surgeon, Dr. Fanny Homme  who performed surgery on her hand, and another surgeon, Dr. Glenora Laos, who was hesitant to perform further surgery due to the complexity of her condition.   She experiences difficulty using her hand for daily activities, such as eating, due to the pain and limited mobility. She reports experiencing fever-like symptoms but is unsure of their significance. No other areas of flare-up outside of her wrist.    Previous HPI 12/24/2022 Lori Orozco is a 73 y.o. 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  She also started on Fosamax  70 mg once weekly for osteoporosis without any trouble taking the medication.  She had 1 episode of increased stiffness and swelling throughout proximal joints in her right hand this lasted for about 3 days.  She did not change her medications or take anything different to resolve this.  Saw Dr. Glenora Laos to discuss options to improve use of her right hand is waiting to hear back on recommendation or other options.  She just had a fall fell forward in the bathroom striking on her left shoulder and left side of  the neck and base of skull.  Before this her last major fall was late last year has had about 2 or 3 other falls in total with suddenly losing balance.   Previous HPI 09/10/22 Lori Orozco is a 73 y.o. 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 She has significant pain in her back and legs interval workup indicating nerve impingement and is scheduled for injections with Dr. Daisey Dryer for this. MRI also with vertebral fractures probably chronic. In her arms right wrist remains painful and decreased mobility worst on the ulnar side.   Previous HPI 05/30/22 Lori Orozco is a 73 y.o. 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  She was taking the medications without interruptions but started experiencing increased joint pain swelling and stiffness especially affecting her hands wrists and elbows since about 2 weeks ago.  She called about this and we increased her to 20 mg prednisone  daily up from 10 mg and she has noticed improvement in symptoms especially her elbows.  No longer swollen and is able to tolerate resting her arms on a flat surface without severe pain.  She did suffer from upper respiratory symptoms last week but these have mostly resolved without any new medication needed.    Previous HPI 02/28/22 Lori Orozco is a 73 y.o. female here for follow up for seronegative rheumatoid arthritis on Enbrel  50 mg subcu weekly and prednisone  10 mg daily.  She had to stop taking the sulfasalazine  since about 2 weeks ago due to running out of the medication.  She had change with insurance status so having a dramatically increased cost for her medications at the moment that she is having to sort out.  She was tolerating the medicine okay.  She did not recall a noticeable difference in symptoms with starting the sulfasalazine  but while off the  medication has felt noticeably worse so thinks it was doing something.  Particularly the left wrist pain has been a lot better on treatment her right wrist continues to be bad before and after.  She saw Dr. Lamon Pillow for her neck recommendation was for physical therapy but she has not pursued this since and does not feel like she would tolerate this well based on previous therapy experiences.  She does have plans for trying the nerve stimulator treatment starting later this month.      Previous HPI 12/21/21 Lori Orozco is a 73 y.o. female here for follow up for seronegative inflammatory arthritis on Enbrel  50 mg subcu weekly.  She continues to have pain and stiffness affecting her hands bilaterally although the severity of swelling has gotten notably better on the Enbrel  treatment but is far from resolved.  Her biggest concern at the moment is  about her neck pain concern of cervical radiculopathy.  We checked x-ray that showed considerable multilevel degenerative disease with no acute appearing bony abnormality.  She is followed up with her spine doctor with epidural block injection tried did not find it very beneficial at all.  She has discussed radiofrequency ablation as a neck step option for radicular pain.     Previous HPI 10/09/2021 Lori Orozco is a 73 y.o. female here for follow up for seronegative inflammatory arthritis on Enbrel  50 mg [place] weekly and prednisone  10 mg daily. She tried tapering prednisone  but had severe worsening symptoms at 5 mg dose. She increased back to 10 mg daily and symptoms improved somewhat but still a lot of pain and swelling in both hands and wrists. She has ongoing back pain and stiffness and is seeing D.r Lamon Pillow for this with known significant lumbar spine arthritis. More recently new problem with sensitivity and stinging type pain throughout both arms with even light pressure such as showering water . She also has worsening weakness in her proximal legs unable to  stand without using arms, which are limited by pain.     Previous HPI 07/05/2021 Lori Orozco is a 73 y.o. female here for follow up for seronegative inflammatory arthritis on prednisone  20 mg daily dose and enbrel  50mg  subcutaneous once weekly. She has seen a large improvement in joint inflammation since starting the Enbrel . She has pain and redness around the injection site lasting up to 3 days with each dose. Wrists continue to hurt and have decreased movement left wrist actually more symptomatic than right now. She is working with physical therapy on getting back to walking currently very weak and unstable due to illness and deconditioning. She had recent ED visit for COPD exacerbation finished antibiotics for this. No other recent infection issues.   Previous HPI 02/08/2021 Lori Orozco is a 73 y.o. female here for follow up with seronegative inflammatory arthritis on prednisone  taper at 15 mg daily dose.  She has had major medical events since her last visit.  She developed increased knee pain and swelling on the right side.  However she went to the hospital due to developing more weakness and shortness of breath CT angiogram identified bilateral pulmonary emboli.  Treatment with anticoagulation led to significant gastrointestinal bleeding from previously unknown AVM.  This was treated also in the hospital but overall protracted complications lasted for about a month hospitalization.  During this time she experienced worsening joint pain and swelling and significant deconditioning and weakness.  She was discharged from the hospital to rehab facility she was not continued on any steroid or other anti-inflammatory treatment reports joint pain and swelling affecting numerous joints in bilateral elbows wrists fingers and knees.  This is limited any significant progress with physical therapy so far.  She called back to clinic earlier this month with the symptoms and was restarted on a  prednisone  taper currently down to 15 mg daily.  She saw orthopedics clinic for right knee aspiration injection 2 days ago that is partially helping.   Previous HPI 11/15/20 Lori Orozco is a 73 y.o. female here for follow up with wrist wrist extensor tenosynovitis and presumed seronegative inflammatory arthritis also involving knee joints after starting methotrexate  15 mg PO weekly and tapering prednisone  off. Her wrist is slightly improved but remains very painful and swollen with decreased mobility. After starting methotrexate  she has noticed some episodes of dizziness and urinary retention and frequency. She stopped the prednisone  without  noticing much difference in symptoms so far.     09/20/20 Lori Orozco is a 73 y.o. female here for joint pain and swelling of knees and wrists with severe tenosynovitis s/p tenosynovectomy on 09/01/20 with Dr. Lucienne Ryder.  She had been feeling in her usual health until June she recalls onset of symptoms after using a gas leaf blower at her home during which time she experienced some mild right wrist pain.  She does not recall any particular injury event at this time.  However she experienced progressive ongoing increase in pain with swelling and stiffness in her right wrist especially with decreased range of motion and pain over the dorsal aspect.  This is evaluated at orthopedics clinic with aspiration that revealed just coagulated sample trial of steroid medication and only partially improved symptoms then continued worsening.  She was admitted to the hospital for tenosynovectomy that was performed without complication.  Intraoperatively reported extensive inflammatory tissue debridement without any evidence concerning for infection.  During this hospitalization she also developed knee effusion which was aspirated demonstrating inflammatory synovial fluid with negative microscopy and cultures.  Is now almost 3 weeks since her surgery she continues experiencing  severe pain intensity swelling around the right hand and wrist and has developed some skin blistering.     Labs reviewed 08/2020 Synovial fluid knee 4,525 WBCs 90% neutrophils ANA neg RF 14.2 CCP neg ESR 37 CRP 14.1 Uric acid 4.9   07/2020 Synovial fluid wrist clotted sample negative gram stain   Review of Systems  Constitutional:  Positive for fatigue.  HENT:  Positive for mouth dryness. Negative for mouth sores.   Eyes:  Positive for dryness.  Respiratory:  Negative for shortness of breath.   Cardiovascular:  Negative for chest pain and palpitations.  Gastrointestinal:  Negative for blood in stool, constipation and diarrhea.  Endocrine: Negative for increased urination.  Genitourinary:  Negative for involuntary urination.  Musculoskeletal:  Positive for joint pain, gait problem, joint pain, joint swelling, myalgias, muscle weakness, morning stiffness, muscle tenderness and myalgias.  Skin:  Positive for color change and sensitivity to sunlight. Negative for rash and hair loss.  Allergic/Immunologic: Positive for susceptible to infections.  Neurological:  Positive for dizziness and headaches.  Hematological:  Negative for swollen glands.  Psychiatric/Behavioral:  Positive for depressed mood and sleep disturbance. The patient is nervous/anxious.     PMFS History:  Patient Active Problem List   Diagnosis Date Noted   Colon cancer screening 07/17/2023   Diastolic dysfunction 06/06/2023   Class 2 severe obesity due to excess calories with serious comorbidity and body mass index (BMI) of 35.0 to 35.9 in adult (HCC) 06/06/2023   CKD stage 3b, GFR 30-44 ml/min (HCC) 05/05/2023   Elevated troponin 05/05/2023   Depression with anxiety 05/05/2023   Cerumen impaction 03/06/2023   Lesion of palate 03/06/2023   Mobility impaired 02/10/2023   Generalized weakness 02/08/2023   Recurrent falls 12/24/2022   Pre-operative clearance 10/30/2022   Diarrhea 10/19/2022   Cold intolerance  10/19/2022   Osteoporosis 09/10/2022   Thoracic compression fracture (HCC) 07/13/2022   Diabetes mellitus screening 07/13/2022   Lumbar herniated disc    Chronic pain 11/28/2021   Bilateral leg weakness 10/09/2021   Vitamin B12 deficiency 08/02/2021   Myofascial pain dysfunction syndrome 08/02/2021   Adverse effect of prednisone  07/11/2021   AVM (arteriovenous malformation) 01/03/2021   History of pulmonary embolism 12/08/2020   Arthritis of knee 12/06/2020   Bilateral knee pain 09/20/2020   Seronegative  rheumatoid arthritis (HCC) 09/20/2020   High risk medication use 09/20/2020   Extensor tenosynovitis of right wrist 09/15/2020   S/P reverse total shoulder arthroplasty, 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   Lung nodule 01/02/2020   Status post arthroscopy of left shoulder 01/16/2018   Estrogen deficiency 06/28/2017   Medial epicondylitis, left elbow 04/29/2017   S/P arthroscopy of left shoulder 02/25/2017   Impingement syndrome of left shoulder 01/30/2017   Trochanteric bursitis, right hip 04/10/2016   Hypertension 09/02/2015   Urge incontinence 07/27/2015   Obesity (BMI 30-39.9) 04/28/2015   Osteoarthritis of left knee 01/28/2015   Status post total left knee replacement 01/28/2015   Vitamin D  deficiency 04/27/2014   Seasonal and perennial allergic rhinitis 06/17/2013   S/P total hip arthroplasty 03/20/2013   Medicare annual wellness visit, subsequent 11/18/2012   At high risk for falls 11/18/2012   Routine general medical examination at a health care facility 11/10/2012   Lichen sclerosus et atrophicus of the vulva 07/29/2012   Osteopenia 10/19/2011   Hypothyroidism 10/19/2011   Hereditary and idiopathic peripheral neuropathy 08/05/2009   Low back pain 08/05/2009   HAND PAIN, BILATERAL 08/05/2009   TREMOR 03/09/2008   MENOPAUSAL SYNDROME 10/09/2007   DEPRESSION, MAJOR 05/20/2007   BIPOLAR AFFECTIVE DISORDER 05/20/2007   Generalized anxiety disorder 05/20/2007   PERSONALITY DISORDER  05/20/2007   ESOPHAGEAL SPASM 05/20/2007   Diaphragmatic hernia 05/20/2007   COPD mixed type (HCC) 03/27/2007   HYPERCHOLESTEROLEMIA, PURE 12/10/2006   SYMPTOM, SYNDROME, CHRONIC FATIGUE 12/10/2006    Past Medical History:  Diagnosis Date   Allergy Hayfever   1958   Amaurosis fugax    Anemia    hx   Anxiety    Arthritis    Asthma    Blood transfusion without reported diagnosis    Cataract    Chronic fatigue syndrome    Chronic kidney disease    frequency, nephritis when 73 yrs old   COPD (chronic obstructive pulmonary disease) (HCC)    COVID-19    Depression    Diverticulitis    Emphysema of lung (HCC)    Fibromyalgia    GERD (gastroesophageal reflux disease)    occ   H/O hiatal hernia    History of bronchitis    Hyperlipidemia    Hypothyroidism    Interstitial cystitis    Irritable bowel syndrome    Lichen sclerosus    Lumbar herniated disc    Migraines    Motor nerve conduction block 11/2021   Osteoporosis 09/10/2022   Pneumonia    PONV (postoperative nausea and vomiting)    Shortness of breath    exertion    Thyroid  disease    Graves   Urinary frequency    Urinary tract infection    Vertigo     Family History  Problem Relation Age of Onset   Arthritis Mother    Hypertension Mother    Kidney disease Mother    Heart disease Mother    Stroke Mother    Cancer Father        Bladder   Alcohol abuse Father    Arthritis Sister    Heart disease Sister    Colon cancer Other    Cancer - Lung Cousin    Arthritis Maternal Grandmother    Hearing loss Maternal Grandfather    Varicose Veins Maternal Uncle    Past Surgical History:  Procedure Laterality Date   ABDOMINAL HYSTERECTOMY     bladder sugery      CAROTID DOPPLAR  COLONOSCOPY  05/26/2010   avms- otherwise nl , re check 10y   DEXA-OSTEOPENIA     DOPPLER ECHOCARDIOGRAPHY     elbow surgery     EPICONDYLITIS     EYE SURGERY Bilateral    cataracts   FOOT SURGERY Bilateral    I & D EXTREMITY  Right 09/01/2020   Procedure: TYNOSYNOVECTOMY;  Surgeon: Arnie Lao, MD;  Location: [facility] OR;  Service: Orthopedics;  Laterality: Right;   IMPLANTATION VAGAL NERVE STIMULATOR  03/2022   JOINT REPLACEMENT     KNEE SURGERY     Left cartilage   lipoma in second finger right hand     MOUTH SURGERY  08/22/2022   PLANTAR FASCIA SURGERY Left    REVERSE SHOULDER ARTHROPLASTY Left 02/02/2020   Procedure: LEFT REVERSE SHOULDER ARTHROPLASTY;  Surgeon: Jasmine Mesi, MD;  Location: [facility] OR;  Service: Orthopedics;  Laterality: Left;   ROTATOR CUFF REPAIR Left 12/2017   TEMPOROMANDIBULAR JOINT SURGERY     TONSILLECTOMY     TOTAL HIP ARTHROPLASTY Right 03/20/2013   Procedure: Right TOTAL HIP ARTHROPLASTY;  Surgeon: Timothy Ford, MD;  Location: [facility] OR;  Service: Orthopedics;  Laterality: Right;  Right Total Hip Arthroplasty   TOTAL KNEE ARTHROPLASTY Left 01/28/2015   Procedure: LEFT TOTAL KNEE ARTHROPLASTY;  Surgeon: Arnie Lao, MD;  Location: [facility];  Service: Orthopedics;  Laterality: Left;   TRIGGER FINGER RELEASE Right    TROCHANTERIC BURSA EXCISION Right 05/03/2016   Dr. Lucienne Ryder, Veryl Gottron   WRIST ARTHROSCOPY Right    ligament tear   Social History   Social History Narrative   Not on file   Immunization History  Administered Date(s) Administered   Influenza Split 11/19/2011, 11/02/2013   Influenza Whole 12/20/2006, 11/25/2008, 11/26/2009, 11/24/2010   Influenza, High Dose Seasonal PF 12/13/2016, 10/30/2022   Influenza,inj,Quad PF,6+ Mos 12/06/2015, 11/28/2021   Influenza-Unspecified 12/08/2012, 12/03/2014, 12/08/2017, 11/10/2018, 10/31/2019   Moderna Sars-Covid-2 Vaccination 06/21/2019, 07/19/2019, 01/22/2020   PFIZER(Purple Top)SARS-COV-2 Vaccination 06/04/2020, 10/30/2022   Pfizer Covid-19 Vaccine Bivalent Booster 60yrs & up 12/29/2021   Pneumococcal Conjugate-13 12/06/2015   Pneumococcal Polysaccharide-23 01/26/2002, 03/23/2008, 09/07/2010, 06/28/2017    Respiratory Syncytial Virus Vaccine,Recomb Aduvanted(Arexvy) 12/29/2021   Td 06/26/2000, 10/19/2011, 02/14/2022   Zoster Recombinant(Shingrix) 09/16/2017, 08/11/2022     Objective: Vital Signs: BP 138/70 (BP Location: Left Arm, Patient Position: Sitting, Cuff Size: Normal)   Resp 14   Ht 5' 7 (1.702 m)   Wt 223 lb (101.2 kg)   BMI 34.93 kg/m    Physical Exam  Eyes:     Conjunctiva/sclera: Conjunctivae normal.    Cardiovascular:     Rate and Rhythm: Normal rate and regular rhythm.  Pulmonary:     Effort: Pulmonary effort is normal.     Breath sounds: Normal breath sounds.   Skin:    General: Skin is warm and dry.     Findings: No rash.   Neurological:     Mental Status: She is alert.   Psychiatric:        Mood and Affect: Mood normal.      Musculoskeletal Exam:  Diffuse tenderness to pressure throuought neck and back, arms, and legs Shoulders full ROM no tenderness or swelling Elbows full ROM no tenderness or swelling Right wrist subluxation, limited ROM, cannot extend fingers 3-5 much actively passive ROM seems preserved No paraspinal tenderness to palpation over upper and lower back Hip normal internal and external rotation without pain, no tenderness to lateral hip palpation Knees full  ROM no tenderness or swelling   Investigation: No additional findings.  Imaging: No results found.  Recent Labs: Lab Results  Component Value Date   WBC 6.4 07/30/2023   HGB 11.8 07/30/2023   PLT 198 07/30/2023   NA 140 07/02/2023   K 4.9 07/02/2023   CL 105 07/02/2023   CO2 30 07/02/2023   GLUCOSE 107 (H) 07/02/2023   BUN 36 (H) 07/02/2023   CREATININE 1.12 (H) 07/02/2023   BILITOT 0.4 07/02/2023   ALKPHOS 40 05/04/2023   AST 13 07/02/2023   ALT 11 07/02/2023   PROT 6.4 07/02/2023   ALBUMIN 3.2 (L) 05/04/2023   CALCIUM 9.3 07/02/2023   GFRAA >60 01/29/2015   QFTBGOLDPLUS NEGATIVE 11/07/2022    Speciality Comments: No specialty comments  available.  Procedures:  No procedures performed Allergies: Lithium; Tegretol [carbamazepine]; Tricyclic antidepressants; Methotrexate  derivatives; Strawberry extract; Tapentadol; Codeine; Cymbalta [duloxetine hcl]; Erythromycin; Lyrica  [pregabalin ]; Neurontin [gabapentin]; Rabeprazole sodium; Baclofen ; Duraprep [antiseptic products, misc.]; Penicillins; and Tape   Assessment / Plan:     Visit Diagnoses: Seronegative rheumatoid arthritis (HCC) - Prescribe 5 mg hydrocodone  as needed for painContact hand surgery clinic to expedite surgical evaluation. - Plan: Sedimentation rate, C-reactive protein, predniSONE  (DELTASONE ) 10 MG tablet, HYDROcodone -acetaminophen  (NORCO/VICODIN) 5-325 MG tablet Rheumatoid arthritis inadequately controlled with Enbrel , indicated by high inflammation markers and worsening symptoms. Actemra considered due to different mechanism and potential for rapid improvement. Vigilance for diverticulitis symptoms advised due to risk of intestinal perforation. Actemra may cause injection reactions and affect cholesterol levels. - Plan to start ACtemra 162 mg [place] q14days due to Enbrel  loss of efficacy - Continue prednisone  10 mg daily - Recheck inflammation markers today. - Discuss potential for Actemra infusion if assistance program issues arise. - Short term Rx for 5 mg Norco as needed due to uncontrolled symptoms - Upcoming appt scheduled with hand surgeon 6/21 regarding right hand possible extensor tendon damage  High risk medication use - Enbrel  50 mg subcu weekly - Plan: CBC with Differential/Platelet, Discussed risks of Actemra medication including injection reactions, infections, cytopenias, liver toxicity, hyperlipidemia, shingles. She does have personal history of intestinal AVM and of diveriticulitis but without prior complications related to this. - Rechecking CBC and CMP for medication monitoring Enbrel /baseline for ACtemra  Muscle pain Muscle pain likely related to  uncontrolled rheumatoid arthritis, causing significant discomfort and sleep disturbance.   Orders: Orders Placed This Encounter  Procedures   Sedimentation rate   C-reactive protein   CBC with Differential/Platelet   Meds ordered this encounter  Medications   predniSONE  (DELTASONE ) 10 MG tablet    Sig: Take 1 tablet (10 mg total) by mouth daily with breakfast.    Dispense:  90 tablet    Refill:  0   HYDROcodone -acetaminophen  (NORCO/VICODIN) 5-325 MG tablet    Sig: Take 1 tablet by mouth daily as needed for severe pain (pain score 7-10).    Dispense:  30 tablet    Refill:  0     Follow-Up Instructions: No follow-ups on file.   Matt Song, MD  Note - This record has been created using [employer].  Chart creation errors have been sought, but may not always  have been located. Such creation errors do not reflect on  the standard of medical care.

## 2023-07-17 ENCOUNTER — Ambulatory Visit: Payer: Self-pay | Admitting: Family Medicine

## 2023-07-17 ENCOUNTER — Ambulatory Visit (INDEPENDENT_AMBULATORY_CARE_PROVIDER_SITE_OTHER): Payer: Medicare Other | Admitting: Family Medicine

## 2023-07-17 ENCOUNTER — Encounter: Payer: Self-pay | Admitting: Family Medicine

## 2023-07-17 VITALS — BP 125/70 | HR 90 | Temp 98.4°F | Ht 66.5 in | Wt 217.4 lb

## 2023-07-17 DIAGNOSIS — E538 Deficiency of other specified B group vitamins: Secondary | ICD-10-CM | POA: Diagnosis not present

## 2023-07-17 DIAGNOSIS — F319 Bipolar disorder, unspecified: Secondary | ICD-10-CM

## 2023-07-17 DIAGNOSIS — M0600 Rheumatoid arthritis without rheumatoid factor, unspecified site: Secondary | ICD-10-CM | POA: Diagnosis not present

## 2023-07-17 DIAGNOSIS — E66812 Obesity, class 2: Secondary | ICD-10-CM

## 2023-07-17 DIAGNOSIS — E039 Hypothyroidism, unspecified: Secondary | ICD-10-CM

## 2023-07-17 DIAGNOSIS — F418 Other specified anxiety disorders: Secondary | ICD-10-CM

## 2023-07-17 DIAGNOSIS — Z1211 Encounter for screening for malignant neoplasm of colon: Secondary | ICD-10-CM | POA: Insufficient documentation

## 2023-07-17 DIAGNOSIS — J449 Chronic obstructive pulmonary disease, unspecified: Secondary | ICD-10-CM

## 2023-07-17 DIAGNOSIS — E7800 Pure hypercholesterolemia, unspecified: Secondary | ICD-10-CM

## 2023-07-17 DIAGNOSIS — M8580 Other specified disorders of bone density and structure, unspecified site: Secondary | ICD-10-CM

## 2023-07-17 DIAGNOSIS — N1832 Chronic kidney disease, stage 3b: Secondary | ICD-10-CM

## 2023-07-17 DIAGNOSIS — I10 Essential (primary) hypertension: Secondary | ICD-10-CM

## 2023-07-17 DIAGNOSIS — E559 Vitamin D deficiency, unspecified: Secondary | ICD-10-CM

## 2023-07-17 DIAGNOSIS — T380X5A Adverse effect of glucocorticoids and synthetic analogues, initial encounter: Secondary | ICD-10-CM

## 2023-07-17 DIAGNOSIS — Z131 Encounter for screening for diabetes mellitus: Secondary | ICD-10-CM

## 2023-07-17 LAB — TSH: TSH: 5.04 u[IU]/mL (ref 0.35–5.50)

## 2023-07-17 LAB — LIPID PANEL
Cholesterol: 219 mg/dL — ABNORMAL HIGH (ref 0–200)
HDL: 63.7 mg/dL (ref >39.00)
LDL Cholesterol: 128 mg/dL — ABNORMAL HIGH (ref 0–99)
NonHDL: 155.74
Total CHOL/HDL Ratio: 3
Triglycerides: 137 mg/dL (ref 0.0–149.0)
VLDL: 27.4 mg/dL (ref 0.0–40.0)

## 2023-07-17 LAB — VITAMIN B12: Vitamin B-12: 829 pg/mL (ref 211–911)

## 2023-07-17 MED ORDER — LEVOTHYROXINE SODIUM 50 MCG PO TABS: 50.0000 ug | ORAL_TABLET | Freq: Every day | ORAL | 3 refills | Status: AC

## 2023-07-17 NOTE — Patient Instructions (Addendum)
 Take care of yourself  No change in medicines   Labs today  We will refill thyroid  medicine when labs come back     Try to get most of your carbohydrates from produce (with the exception of white potatoes) and whole grains Eat less bread/pasta/rice/snack foods/cereals/sweets and other items from the middle of the grocery store (processed carbs) Eat lean protein

## 2023-07-17 NOTE — Assessment & Plan Note (Signed)
 Cologuard ordered Past history of intestinal avm -no bleeding lately

## 2023-07-17 NOTE — Assessment & Plan Note (Signed)
 Taking 5000 mcg daily of B12  Level today

## 2023-07-17 NOTE — Assessment & Plan Note (Signed)
 Lab Results  Component Value Date   HGBA1C 5.6 05/05/2023   HGBA1C 5.2 07/13/2022   HGBA1C 5.4 07/11/2021   disc imp of low glycemic diet and wt loss to prevent DM2

## 2023-07-17 NOTE — Assessment & Plan Note (Signed)
 Last GFR 52 Encouraged to work on fluid intake  Avoid nsaids

## 2023-07-17 NOTE — Assessment & Plan Note (Addendum)
 Dexa 6/22 - normal but osteopenic in past and former smoker  No recent falls or fracture  Remote history of spinal compression fracture On low dose chronic prednisone  Continues vit D Exercise limited due to severe arthritis  Discussed fall prevention, supplements and exercise for bone density

## 2023-07-17 NOTE — Assessment & Plan Note (Signed)
 No clinical changes Levothyroxine  50 mcg daily  TSH ordered

## 2023-07-17 NOTE — Progress Notes (Signed)
 Subjective:    Patient ID: Lori Orozco, female    DOB: February 08, 1951, 73 y.o.   MRN: [id number]  HPI  Here for annual follow up of chronic medical problems   Wt Readings from Last 3 Encounters:  07/17/23 217 lb 6 oz (98.6 kg)  07/15/23 211 lb (95.7 kg)  07/02/23 217 lb 9.6 oz (98.7 kg)   34.56 kg/m  Vitals:   07/17/23 0859 07/17/23 0929  BP: (!) 162/86 125/70  Pulse: 90   Temp: 98.4 F (36.9 C)   SpO2: 95%     Immunization History  Administered Date(s) Administered   Influenza Split 11/19/2011, 11/02/2013   Influenza Whole 12/20/2006, 11/25/2008, 11/26/2009, 11/24/2010   Influenza, High Dose Seasonal PF 12/13/2016, 10/30/2022   Influenza,inj,Quad PF,6+ Mos 12/06/2015, 11/28/2021   Influenza-Unspecified 12/08/2012, 12/03/2014, 12/08/2017, 11/10/2018, 10/31/2019   Moderna Sars-Covid-2 Vaccination 06/21/2019, 07/19/2019, 01/22/2020   PFIZER(Purple Top)SARS-COV-2 Vaccination 06/04/2020, 10/30/2022   Pfizer Covid-19 Vaccine Bivalent Booster 42yrs & up 12/29/2021   Pneumococcal Conjugate-13 12/06/2015   Pneumococcal Polysaccharide-23 01/26/2002, 03/23/2008, 09/07/2010, 06/28/2017   Respiratory Syncytial Virus Vaccine,Recomb Aduvanted(Arexvy) 12/29/2021   Td 06/26/2000, 10/19/2011, 02/14/2022   Zoster Recombinant(Shingrix) 09/16/2017, 08/11/2022    There are no preventive care reminders to display for this patient.  Lung cancer screen 04/2023 (had CT when hospitalized)   Chronic pain is a big problem Planning surgery on her right hand (is very symptomatic)   Mammogram 01/2023  Self breast exam-no lumps  Watching some calcifications   Gyn health-no problems  Has lichen sclerosis - no issues or problems at all lately    Colon cancer screening   Cologuard 06/2020 . Wants to repeat  History of AVM-no problems recently   Bone health  Dexa 07/2022 bmd in normal range but had osteopenia and spinal com fracture in past Taking alendronate   Falls-none recent   [employer] - still taking D  Last vitamin D  Lab Results  Component Value Date   VD25OH 42 02/12/2023     Exercise : very limited with arthritis  Walks around house on days when she can    Mood    07/17/2023    9:05 AM 07/15/2023    1:18 PM 06/06/2023   11:06 AM 02/08/2023   11:15 AM 10/30/2022    3:05 PM  Depression screen PHQ 2/9  Decreased Interest 3 1 1 3 3   Down, Depressed, Hopeless 3 1 1 2 3   PHQ - 2 Score 6 2 2 5 6   Altered sleeping 3 0 1 1 3   Tired, decreased energy 3 1  3 3   Change in appetite 3 0 3 2 3   Feeling bad or failure about yourself  2 0 0 2 3  Trouble concentrating 2 1 1 3 3   Moving slowly or fidgety/restless 2 0 0 2 3  Suicidal thoughts 0 0 0 0 0  PHQ-9 Score 21 4 7 18 24   Difficult doing work/chores Very difficult Not difficult at all Somewhat difficult Extremely dIfficult Extremely dIfficult  Sees psychiatry  Dr Garnet Just  Getting a new counselor- hers retired  No medicine changes recently      HTN bp is stable today  No cp or palpitations or headaches or edema  No side effects to medicines  BP Readings from Last 3 Encounters:  07/17/23 125/70  07/15/23 138/80  07/02/23 138/80    Carvedilol  3.125 mg bid Pulse Readings from Last 3 Encounters:  07/17/23 90  07/02/23 74  06/06/23 77   Lab  Results  Component Value Date   NA 140 07/02/2023   K 4.9 07/02/2023   CO2 30 07/02/2023   GLUCOSE 107 (H) 07/02/2023   BUN 36 (H) 07/02/2023   CREATININE 1.12 (H) 07/02/2023   CALCIUM 9.3 07/02/2023   GFR 53.52 (L) 06/06/2023   EGFR 52 (L) 07/02/2023   GFRNONAA 42 (L) 05/11/2023   CKD  Lab Results  Component Value Date   WBC 7.4 06/06/2023   HGB 12.4 06/06/2023   HCT 37.9 06/06/2023   MCV 98.1 06/06/2023   PLT 180.0 06/06/2023     Copd- has been better recently / no complaints  Nurse came out yesterday   Hypothyroidism  Pt has no clinical changes No change in energy level/ hair or skin/ edema and no tremor Lab  Results  Component Value Date   TSH 5.04 07/17/2023    Levothyroxine  50 mcg daily   DM screen Lab Results  Component Value Date   HGBA1C 5.6 05/05/2023    Hyperlipidemia Lab Results  Component Value Date   CHOL 219 (H) 07/17/2023   HDL 63.70 07/17/2023   LDLCALC 128 (H) 07/17/2023   TRIG 137.0 07/17/2023   CHOLHDL 3 07/17/2023   Zocor  20 mg   Appetite is too good Sister fixed healthy foods  Eats bad late at night -that is the problem     B12 def Lab Results  Component Value Date   VITAMINB12 829 07/17/2023     Patient Active Problem List   Diagnosis Date Noted   Colon cancer screening 07/17/2023   Diastolic dysfunction 06/06/2023   Class 2 severe obesity due to excess calories with serious comorbidity and body mass index (BMI) of 35.0 to 35.9 in adult (HCC) 06/06/2023   CKD stage 3b, GFR 30-44 ml/min (HCC) 05/05/2023   Elevated troponin 05/05/2023   Depression with anxiety 05/05/2023   Cerumen impaction 03/06/2023   Lesion of palate 03/06/2023   Mobility impaired 02/10/2023   Generalized weakness 02/08/2023   Recurrent falls 12/24/2022   Pre-operative clearance 10/30/2022   Diarrhea 10/19/2022   Cold intolerance 10/19/2022   Osteoporosis 09/10/2022   Thoracic compression fracture (HCC) 07/13/2022   Diabetes mellitus screening 07/13/2022   Lumbar herniated disc    Chronic pain 11/28/2021   Bilateral leg weakness 10/09/2021   Vitamin B12 deficiency 08/02/2021   Myofascial pain dysfunction syndrome 08/02/2021   Adverse effect of prednisone  07/11/2021   AVM (arteriovenous malformation) 01/03/2021   History of pulmonary embolism 12/08/2020   Arthritis of knee 12/06/2020   Bilateral knee pain 09/20/2020   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   High risk medication use 09/20/2020   Extensor tenosynovitis of right wrist 09/15/2020   S/P reverse total shoulder arthroplasty, 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   Lung nodule 01/02/2020   Status post arthroscopy of  left shoulder 01/16/2018   Estrogen deficiency 06/28/2017   Medial epicondylitis, left elbow 04/29/2017   S/P arthroscopy of left shoulder 02/25/2017   Impingement syndrome of left shoulder 01/30/2017   Trochanteric bursitis, right hip 04/10/2016   Hypertension 09/02/2015   Urge incontinence 07/27/2015   Obesity (BMI 30-39.9) 04/28/2015   Osteoarthritis of left knee 01/28/2015   Status post total left knee replacement 01/28/2015   Vitamin D  deficiency 04/27/2014   Seasonal and perennial allergic rhinitis 06/17/2013   S/P total hip arthroplasty 03/20/2013   Medicare annual wellness visit, subsequent 11/18/2012   At high risk for falls 11/18/2012   Routine general medical examination at a health care facility 11/10/2012  Lichen sclerosus et atrophicus of the vulva 07/29/2012   Osteopenia 10/19/2011   Hypothyroidism 10/19/2011   Hereditary and idiopathic peripheral neuropathy 08/05/2009   Low back pain 08/05/2009   HAND PAIN, BILATERAL 08/05/2009   TREMOR 03/09/2008   MENOPAUSAL SYNDROME 10/09/2007   DEPRESSION, MAJOR 05/20/2007   BIPOLAR AFFECTIVE DISORDER 05/20/2007   Generalized anxiety disorder 05/20/2007   PERSONALITY DISORDER 05/20/2007   ESOPHAGEAL SPASM 05/20/2007   Diaphragmatic hernia 05/20/2007   COPD mixed type (HCC) 03/27/2007   HYPERCHOLESTEROLEMIA, PURE 12/10/2006   SYMPTOM, SYNDROME, CHRONIC FATIGUE 12/10/2006   Past Medical History:  Diagnosis Date   Allergy Hayfever   1958   Amaurosis fugax    Anemia    hx   Anxiety    Arthritis    Asthma    Blood transfusion without reported diagnosis    Cataract    Chronic fatigue syndrome    Chronic kidney disease    frequency, nephritis when 73 yrs old   COPD (chronic obstructive pulmonary disease) (HCC)    COVID-19    Depression    Diverticulitis    Emphysema of lung (HCC)    Fibromyalgia    GERD (gastroesophageal reflux disease)    occ   H/O hiatal hernia    History of bronchitis    Hyperlipidemia     Hypothyroidism    Interstitial cystitis    Irritable bowel syndrome    Lichen sclerosus    Lumbar herniated disc    Migraines    Motor nerve conduction Orozco 11/2021   Osteoporosis 09/10/2022   Pneumonia    PONV (postoperative nausea and vomiting)    Shortness of breath    exertion    Thyroid  disease    Graves   Urinary frequency    Urinary tract infection    Vertigo    Past Surgical History:  Procedure Laterality Date   ABDOMINAL HYSTERECTOMY     bladder sugery      CAROTID DOPPLAR     COLONOSCOPY  05/26/2010   avms- otherwise nl , re check 10y   DEXA-OSTEOPENIA     DOPPLER ECHOCARDIOGRAPHY     elbow surgery     EPICONDYLITIS     EYE SURGERY Bilateral    cataracts   FOOT SURGERY Bilateral    I & D EXTREMITY Right 09/01/2020   Procedure: TYNOSYNOVECTOMY;  Surgeon: Arnie Lao, MD;  Location: [facility] OR;  Service: Orthopedics;  Laterality: Right;   IMPLANTATION VAGAL NERVE STIMULATOR  03/2022   JOINT REPLACEMENT     KNEE SURGERY     Left cartilage   lipoma in second finger right hand     MOUTH SURGERY  08/22/2022   PLANTAR FASCIA SURGERY Left    REVERSE SHOULDER ARTHROPLASTY Left 02/02/2020   Procedure: LEFT REVERSE SHOULDER ARTHROPLASTY;  Surgeon: Jasmine Mesi, MD;  Location: [facility] OR;  Service: Orthopedics;  Laterality: Left;   ROTATOR CUFF REPAIR Left 12/2017   TEMPOROMANDIBULAR JOINT SURGERY     TONSILLECTOMY     TOTAL HIP ARTHROPLASTY Right 03/20/2013   Procedure: Right TOTAL HIP ARTHROPLASTY;  Surgeon: Timothy Ford, MD;  Location: [facility] OR;  Service: Orthopedics;  Laterality: Right;  Right Total Hip Arthroplasty   TOTAL KNEE ARTHROPLASTY Left 01/28/2015   Procedure: LEFT TOTAL KNEE ARTHROPLASTY;  Surgeon: Arnie Lao, MD;  Location: [facility];  Service: Orthopedics;  Laterality: Left;   TRIGGER FINGER RELEASE Right    TROCHANTERIC BURSA EXCISION Right 05/03/2016   Dr. Lucienne Ryder, Veryl Gottron  WRIST ARTHROSCOPY Right    ligament tear    Social History   Tobacco Use   Smoking status: Former    Current packs/day: 0.00    Average packs/day: 2.0 packs/day for 43.0 years (86.0 ttl pk-yrs)    Types: Cigarettes    Start date: 02/26/1970    Quit date: 02/26/2013    Years since quitting: 10.3    Passive exposure: Never   Smokeless tobacco: Never   Tobacco comments:    Vaping occasionally  Vaping Use   Vaping status: Former   Devices: haven't used in 8 months  Substance Use Topics   Alcohol use: No   Drug use: No   Family History  Problem Relation Age of Onset   Arthritis Mother    Hypertension Mother    Kidney disease Mother    Heart disease Mother    Stroke Mother    Cancer Father        Bladder   Alcohol abuse Father    Arthritis Sister    Heart disease Sister    Colon cancer Other    Cancer - Lung Cousin    Arthritis Maternal Grandmother    Hearing loss Maternal Grandfather    Varicose Veins Maternal Uncle    Allergies  Allergen Reactions   Lithium Anaphylaxis   Tegretol [Carbamazepine] Other (See Comments)    Fever and body aches (fever over 103)   Tricyclic Antidepressants Anaphylaxis    Other reaction(s): Unknown   Methotrexate  Derivatives Other (See Comments)    Urinary retention and dizziness  Other reaction(s): Other UTI   Strawberry Extract Hives   Tapentadol Rash    PT ALLERGIC NYLON TAPE    Codeine Nausea Only    Makes pt stay awake   Cymbalta [Duloxetine Hcl] Other (See Comments)    Makes pt pass out    Erythromycin     abdominal pain   Lyrica  [Pregabalin ]     Felt faint   Neurontin [Gabapentin]     Passes  out   Rabeprazole Sodium     insomnia   Baclofen  Nausea Only    Sever nausea   Duraprep [Antiseptic Products, Misc.] Itching and Rash    Tolerates Betadine     Penicillins Rash    Tolerated ANCEF  02/02/20  Has patient had a PCN reaction causing immediate rash, facial/tongue/throat swelling, SOB or lightheadedness with hypotension: Yes Has patient had a PCN reaction  causing severe rash involving mucus membranes or skin necrosis: No Has patient had a PCN reaction that required hospitalization No Has patient had a PCN reaction occurring within the last 10 years: No If all of the above answers are "NO", then may proceed with Cephalosporin use.    Tape Rash    PT ALLERGIC NYLON TAPE    Current Outpatient Medications on File Prior to Visit  Medication Sig Dispense Refill   acetaminophen  (TYLENOL ) 500 MG tablet Take 500 mg by mouth every 6 (six) hours as needed.     alendronate  (FOSAMAX ) 70 MG tablet TAKE 1 TABLET BY MOUTH ONCE A WEEK. TAKE WITH A FULL GLASS OF WATER  ON AN EMPTY STOMACH. 12 tablet 1   Black Cohosh  40 MG CAPS Take 1 capsule (40 mg total) by mouth daily. 30 capsule 11   Budeson-Glycopyrrol-Formoterol  (BREZTRI  AEROSPHERE) 160-9-4.8 MCG/ACT AERO Inhale 2 puffs into the lungs 2 (two) times daily. 3 each 4   buPROPion  (WELLBUTRIN  XL) 150 MG 24 hr tablet Take 150 mg by mouth daily.     busPIRone  (  BUSPAR ) 15 MG tablet Take 15 mg by mouth 2 (two) times daily before a meal.     carvedilol  (COREG ) 3.125 MG tablet Take 1 tablet (3.125 mg total) by mouth 2 (two) times daily with a meal. 90 tablet 3   cholecalciferol  (VITAMIN D ) 25 MCG (1000 UNIT) tablet Take 2,000 Units by mouth daily. 2 tablets daily     clobetasol  cream (TEMOVATE ) 0.05 % APPLY SMALL AMOUNT TO AFFECTED AREAS TWO TO THREE TIMES PER WEEK 30 g 0   cyanocobalamin  2000 MCG tablet Take 500 mcg by mouth daily.     diclofenac  Sodium (VOLTAREN ) 1 % GEL Apply 2 g topically 2 (two) times daily as needed (pain).     diphenhydrAMINE  (BENADRYL ) 25 MG tablet Take 25 mg by mouth every 6 (six) hours as needed for allergies.     divalproex  (DEPAKOTE  ER) 500 MG 24 hr tablet Take 500 mg by mouth at bedtime.      docusate sodium  (COLACE) 100 MG capsule Take 1 capsule (100 mg total) by mouth 2 (two) times daily as needed for mild constipation.     etanercept  (ENBREL  SURECLICK) 50 MG/ML injection Inject 50 mg  into the skin once a week. 12 mL 0   famotidine  (PEPCID ) 20 MG tablet Take 1 tablet (20 mg total) by mouth daily.     fluticasone  (FLONASE ) 50 MCG/ACT nasal spray PLACE 2 SPRAYS INTO THE NOSE DAILY. 16 g 11   HYDROcodone -acetaminophen  (NORCO/VICODIN) 5-325 MG tablet Take 1 tablet by mouth every 12 (twelve) hours as needed for severe pain (pain score 7-10). 30 tablet 0   hydrocortisone cream 1 % Apply 1 Application topically 2 (two) times daily as needed for itching.     ipratropium-albuterol  (DUONEB) 0.5-2.5 (3) MG/3ML SOLN Take 3 mLs by nebulization 2 (two) times daily.     ketotifen  (ZADITOR ) 0.025 % ophthalmic solution Place 1 drop into both eyes 2 (two) times daily as needed (allergies).     levothyroxine  (SYNTHROID ) 50 MCG tablet TAKE 1 TABLET BY MOUTH DAILY BEFORE BREAKFAST 90 tablet 1   LORazepam  (ATIVAN ) 1 MG tablet Take 1 tablet (1 mg total) by mouth at bedtime as needed for anxiety or sleep. 7 tablet 0   mirtazapine  (REMERON ) 30 MG tablet Take 30 mg by mouth at bedtime.      ondansetron  (ZOFRAN -ODT) 4 MG disintegrating tablet Take 1 tablet (4 mg total) by mouth every 8 (eight) hours as needed for nausea or vomiting. Caution of sedation 20 tablet 0   predniSONE  (DELTASONE ) 10 MG tablet TAKE 1 TABLET (10 MG TOTAL) BY MOUTH DAILY WITH BREAKFAST. 90 tablet 0   Rhubarb (ESTROVEN COMPLETE) 4 MG TABS Take 4 mg by mouth daily. 30 tablet 11   sertraline  (ZOLOFT ) 100 MG tablet Take 100 mg by mouth daily.      simvastatin  (ZOCOR ) 20 MG tablet TAKE 1 TABLET BY MOUTH AT BEDTIME 90 tablet 0   Spacer/Aero-Holding Chambers (AEROCHAMBER PLUS) inhaler Use with inhaler 1 each 2   thiamine  (VITAMIN B-1) 100 MG tablet Take 1 tablet (100 mg total) by mouth daily.     traMADol  (ULTRAM ) 50 MG tablet Take 1 tablet (50 mg total) by mouth every 6 (six) hours as needed for moderate pain (pain score 4-6). 15 tablet 0   apixaban  (ELIQUIS ) 5 MG TABS tablet Take 1 tablet (5 mg total) by mouth 2 (two) times daily for 4  days. 180 tablet 1   mirabegron  ER (MYRBETRIQ ) 25 MG TB24 tablet TAKE 1  TABLET BY MOUTH TWICE A DAY 180 tablet 1   No current facility-administered medications on file prior to visit.    Review of Systems  Constitutional:  Positive for fatigue. Negative for activity change, appetite change, fever and unexpected weight change.  HENT:  Negative for congestion, ear pain, rhinorrhea, sinus pressure and sore throat.   Eyes:  Negative for pain, redness and visual disturbance.  Respiratory:  Negative for cough, shortness of breath and wheezing.   Cardiovascular:  Negative for chest pain and palpitations.  Gastrointestinal:  Negative for abdominal pain, blood in stool, constipation and diarrhea.  Endocrine: Negative for polydipsia and polyuria.  Genitourinary:  Negative for dysuria, frequency and urgency.  Musculoskeletal:  Positive for arthralgias, back pain, gait problem, joint swelling and myalgias.  Skin:  Negative for pallor and rash.  Allergic/Immunologic: Negative for environmental allergies.  Neurological:  Negative for dizziness, syncope and headaches.  Hematological:  Negative for adenopathy. Does not bruise/bleed easily.  Psychiatric/Behavioral:  Positive for dysphoric mood and sleep disturbance. Negative for decreased concentration and suicidal ideas. The patient is nervous/anxious.        Objective:   Physical Exam Constitutional:      General: She is not in acute distress.    Appearance: Normal appearance. She is well-developed. She is obese. She is not ill-appearing or diaphoretic.  HENT:     Head: Normocephalic and atraumatic.     Right Ear: Tympanic membrane, ear canal and external ear normal.     Left Ear: Tympanic membrane, ear canal and external ear normal.     Nose: Nose normal. No congestion.     Mouth/Throat:     Mouth: Mucous membranes are moist.     Pharynx: Oropharynx is clear. No posterior oropharyngeal erythema.  Eyes:     General: No scleral icterus.     Extraocular Movements: Extraocular movements intact.     Conjunctiva/sclera: Conjunctivae normal.     Pupils: Pupils are equal, round, and reactive to light.  Neck:     Thyroid : No thyromegaly.     Vascular: No carotid bruit or JVD.  Cardiovascular:     Rate and Rhythm: Normal rate and regular rhythm.     Pulses: Normal pulses.     Heart sounds: Normal heart sounds.     No gallop.  Pulmonary:     Effort: Pulmonary effort is normal. No respiratory distress.     Breath sounds: Normal breath sounds. No wheezing or rales.     Comments: Good air exch Chest:     Chest wall: No tenderness.  Abdominal:     General: Bowel sounds are normal. There is no distension or abdominal bruit.     Palpations: Abdomen is soft. There is no mass.     Tenderness: There is no abdominal tenderness.     Hernia: No hernia is present.  Genitourinary:    Comments: Declines breast exam Musculoskeletal:        General: No tenderness. Normal range of motion.     Cervical back: Normal range of motion and neck supple. No rigidity. No muscular tenderness.     Right lower leg: No edema.     Left lower leg: No edema.     Comments: No kyphosis   Lymphadenopathy:     Cervical: No cervical adenopathy.  Skin:    General: Skin is warm and dry.     Coloration: Skin is not pale.     Findings: No erythema or rash.  Comments: Solar lentigines diffusely Scattered sks   Neurological:     Mental Status: She is alert. Mental status is at baseline.     Cranial Nerves: No cranial nerve deficit.     Motor: No abnormal muscle tone.     Coordination: Coordination normal.     Gait: Gait normal.     Deep Tendon Reflexes: Reflexes are normal and symmetric. Reflexes normal.  Psychiatric:        Mood and Affect: Mood normal.        Cognition and Memory: Cognition and memory normal.           Assessment & Plan:   Problem List Items Addressed This Visit       Cardiovascular and Mediastinum   Hypertension - Primary    bp in fair control at this time  BP Readings from Last 1 Encounters:  07/17/23 125/70   No changes needed Most recent labs reviewed  Disc lifstyle change with low sodium diet and exercise  Continues carvedilol  3.125 mg bid          Respiratory   COPD mixed type (HCC)   Stable/per pt doing well Continues breztri           Endocrine   Hypothyroidism   No clinical changes Levothyroxine  50 mcg daily  TSH ordered       Relevant Orders   TSH (Completed)     Musculoskeletal and Integument   Seronegative rheumatoid arthritis (HCC)   Continues rheum care  Planning surgery for deformity in right hand  Enbrel  Prednisone   Tramadol  prn       Osteopenia   Dexa 6/22 - normal but osteopenic in past and former smoker  No recent falls or fracture  Remote history of spinal compression fracture On low dose chronic prednisone  Continues vit D Exercise limited due to severe arthritis  Discussed fall prevention, supplements and exercise for bone density          Genitourinary   CKD stage 3b, GFR 30-44 ml/min (HCC)   Last GFR 52 Encouraged to work on fluid intake  Avoid nsaids         Other   Vitamin D  deficiency   Last vitamin D  Lab Results  Component Value Date   VD25OH 42 02/12/2023   Vitamin D  level is therapeutic with current supplementation Disc importance of this to bone and overall health       Vitamin B12 deficiency   Taking 5000 mcg daily of B12  Level today      Relevant Orders   Vitamin B12 (Completed)   HYPERCHOLESTEROLEMIA, PURE   Disc goals for lipids and reasons to control them Rev last labs with pt Rev low sat fat diet in detail  Lab today  Continues zocor  20 mg daily       Relevant Orders   Lipid panel (Completed)   Diabetes mellitus screening   Lab Results  Component Value Date   HGBA1C 5.6 05/05/2023   HGBA1C 5.2 07/13/2022   HGBA1C 5.4 07/11/2021   disc imp of low glycemic diet and wt loss to prevent DM2        Depression with anxiety   Continues psychiatric care  PHQ remains high  In process of finding a new counselor as well      Colon cancer screening   Cologuard ordered Past history of intestinal avm -no bleeding lately       Relevant Orders   Cologuard   Class 2 severe obesity due  to excess calories with serious comorbidity and body mass index (BMI) of 35.0 to 35.9 in adult [facility])   Discussed how this problem influences overall health and the risks it imposes  Reviewed plan for weight loss with lower calorie diet (via better food choices (lower glycemic and portion control) along with exercise building up to or more than 30 minutes 5 days per week including some aerobic activity and strength training   Insurance did not cover glp-1 Will check again in the future  Some psych medicines increase appetite       BIPOLAR AFFECTIVE DISORDER   Stable Psychiatric care       Adverse effect of prednisone    Continue to watch bone density and glucose

## 2023-07-17 NOTE — Assessment & Plan Note (Signed)
 Disc goals for lipids and reasons to control them Rev last labs with pt Rev low sat fat diet in detail  Lab today  Continues zocor  20 mg daily

## 2023-07-17 NOTE — Assessment & Plan Note (Signed)
 Continues psychiatric care  PHQ remains high  In process of finding a new counselor as well

## 2023-07-17 NOTE — Assessment & Plan Note (Signed)
 Continue to watch bone density and glucose

## 2023-07-17 NOTE — Assessment & Plan Note (Signed)
 bp in fair control at this time  BP Readings from Last 1 Encounters:  07/17/23 125/70   No changes needed Most recent labs reviewed  Disc lifstyle change with low sodium diet and exercise  Continues carvedilol  3.125 mg bid

## 2023-07-17 NOTE — Assessment & Plan Note (Signed)
 Stable Psychiatric care

## 2023-07-17 NOTE — Assessment & Plan Note (Signed)
 Stable/per pt doing well Continues breztri 

## 2023-07-17 NOTE — Assessment & Plan Note (Signed)
 Last vitamin D  Lab Results  Component Value Date   VD25OH 42 02/12/2023   Vitamin D  level is therapeutic with current supplementation Disc importance of this to bone and overall health

## 2023-07-17 NOTE — Assessment & Plan Note (Signed)
 Continues rheum care  Planning surgery for deformity in right hand  Enbrel  Prednisone   Tramadol  prn

## 2023-07-17 NOTE — Assessment & Plan Note (Signed)
 Discussed how this problem influences overall health and the risks it imposes  Reviewed plan for weight loss with lower calorie diet (via better food choices (lower glycemic and portion control) along with exercise building up to or more than 30 minutes 5 days per week including some aerobic activity and strength training   Insurance did not cover glp-1 Will check again in the future  Some psych medicines increase appetite

## 2023-07-25 LAB — COLOGUARD

## 2023-07-28 ENCOUNTER — Other Ambulatory Visit: Payer: Self-pay | Admitting: Internal Medicine

## 2023-07-28 DIAGNOSIS — M8000XS Age-related osteoporosis with current pathological fracture, unspecified site, sequela: Secondary | ICD-10-CM

## 2023-07-29 ENCOUNTER — Other Ambulatory Visit: Payer: Self-pay

## 2023-07-29 VITALS — Wt 218.0 lb

## 2023-07-29 DIAGNOSIS — Z139 Encounter for screening, unspecified: Secondary | ICD-10-CM

## 2023-07-29 NOTE — Patient Instructions (Signed)
 Visit Information  Thank you for taking time to visit with me today. Please don't hesitate to contact me if I can be of assistance to you before our next scheduled appointment.  Your next care management appointment is by telephone on 08/28/23 at 3:00 pm  Telephone follow-up in 1 month with Michele Ahle RN case manager  Please call the care guide team at [phone] if you need to cancel, schedule, or reschedule an appointment.   Please call the Suicide and Crisis Lifeline: 988 call 1-800-273-TALK (toll free, 24 hour hotline) if you are experiencing a Mental Health or Behavioral Health Crisis or need someone to talk to.  Verba Girt RN, BSN, CCM [employer], Population Health Case Manager Phone: [phone]

## 2023-07-29 NOTE — Patient Outreach (Signed)
 Complex Care Management   Visit Note  07/29/2023  Name:  Lori Orozco MRN: [id number] DOB: 17-Sep-1950  Situation: Referral received for Complex Care Management related to RA/ chronic back pain I obtained verbal consent from Patient.  Visit completed with patient  on the phone  Background:   Past Medical History:  Diagnosis Date   Allergy Hayfever   1958   Amaurosis fugax    Anemia    hx   Anxiety    Arthritis    Asthma    Blood transfusion without reported diagnosis    Cataract    Chronic fatigue syndrome    Chronic kidney disease    frequency, nephritis when 73 yrs old   COPD (chronic obstructive pulmonary disease) (HCC)    COVID-19    Depression    Diverticulitis    Emphysema of lung (HCC)    Fibromyalgia    GERD (gastroesophageal reflux disease)    occ   H/O hiatal hernia    History of bronchitis    Hyperlipidemia    Hypothyroidism    Interstitial cystitis    Irritable bowel syndrome    Lichen sclerosus    Lumbar herniated disc    Migraines    Motor nerve conduction block 11/2021   Osteoporosis 09/10/2022   Pneumonia    PONV (postoperative nausea and vomiting)    Shortness of breath    exertion    Thyroid  disease    Graves   Urinary frequency    Urinary tract infection    Vertigo     Assessment: Patient Reported Symptoms:  Cognitive Cognitive Status: Alert and oriented to person, place, and time, Insightful and able to interpret abstract concepts, Normal speech and language skills Cognitive/Intellectual Conditions Management [RPT]: None reported or documented in medical history or problem list   Health Maintenance Behaviors: Annual physical exam Healing Pattern: Average  Neurological Neurological Review of Symptoms: Numbness, Other: Oher Neurological Symptoms/Conditions [RPT]: tingling.  Patient reports having neuropathy all over her body.  patient states both her primary care provider and rheumatologist are managing her for these  conditions Neurological Conditions:  (neuropathy, pinched nerve in neck.) Neurological Management Strategies: Medication therapy, Routine screening, Medical device  HEENT HEENT Symptoms Reported: No symptoms reported HEENT Conditions:  (patient sees ENT for ongoing monitoring of area in saliva gland. has been seen x 7 years for this issue.) HEENT Management Strategies: Routine screening  (patient sees ENT for ongoing monitoring of area in saliva gland. has been seen x 7 years for this issue.)  Cardiovascular Cardiovascular Symptoms Reported: No symptoms reported Does patient have uncontrolled Hypertension?: No Weight: 218 lb (98.9 kg)  Respiratory Respiratory Symptoms Reported: No symptoms reported    Endocrine Patient reports the following symptoms related to hypoglycemia or hyperglycemia : No symptoms reported Is patient diabetic?: No    Gastrointestinal Gastrointestinal Symptoms Reported: Constipation, Diarrhea Gastrointestinal Conditions: Irritable bowel syndrome Gastrointestinal Management Strategies: Diet modification, Medication therapy Nutrition Risk Screen (CP): No indicators present  Genitourinary Genitourinary Symptoms Reported: Incontinence Genitourinary Conditions: Incontinence Genitourinary Management Strategies: Incontinence garment/pad  Integumentary Integumentary Symptoms Reported: Skin changes Additional Integumentary Details: patient reports having dry and bumpy skin. Skin Management Strategies: Routine screening (patient states she is not on any current treatment for her dry skin.  Uses over the counter lotions.)  Musculoskeletal Musculoskelatal Symptoms Reviewed: Other Other Musculoskeletal Symptoms: patient reports having contracted right hand which causes severe pain.  Reports she is scheduled for  hand surgery on 08/19/23 Musculoskeletal Conditions: Rheumatoid arthritis,  Fibromyalgia, Mobility limited, Unsteady gait, Osteopenia (patient states she has some compounded  fractures in vertebrae. Patient states they fractures can't be fixed.) Musculoskeletal Management Strategies: Routine screening, Medication therapy, Medical device Musculoskeletal Comment: patient reports her next follow up visit with the rheumatologist is 07/30/23 Falls in the past year?: Yes Number of falls in past year: 2 or more Was there an injury with Fall?: Yes Fall Risk Category Calculator: 3 Patient Fall Risk Level: High Fall Risk Patient at Risk for Falls Due to: Impaired balance/gait, Impaired mobility, History of fall(s) Fall risk Follow up: Falls prevention discussed  Psychosocial Psychosocial Symptoms Reported: Anxiety - if selected complete GAD, Depression - if selected complete PHQ 2-9 Behavioral Health Conditions: Anxiety, Depression   Quality of Family Relationships: supportive Do you feel physically threatened by others?: No      07/29/2023    4:07 PM  Depression screen PHQ 2/9  Decreased Interest 3  Down, Depressed, Hopeless 3  PHQ - 2 Score 6  Altered sleeping 3  Tired, decreased energy 3  Change in appetite 3  Feeling bad or failure about yourself  3  Trouble concentrating 3  Moving slowly or fidgety/restless 3  Suicidal thoughts 0  PHQ-9 Score 24    There were no vitals filed for this visit.  Medications Reviewed Today     Reviewed by Makenlee Mckeag E, RN (Registered Nurse) on 07/29/23 at 1538  Med List Status: <None>   Medication Order Taking? Sig Documenting Provider Last Dose Status Informant  acetaminophen  (TYLENOL ) 500 MG tablet [id number] Yes Take 500 mg by mouth every 6 (six) hours as needed. [provider] Taking Active Family Member, Pharmacy Records           Med Note Merlyn Starring, NICOLE   Sun May 05, 2023 12:44 AM) Last dose unknown  alendronate  (FOSAMAX ) 70 MG tablet [id number] Yes TAKE 1 TABLET BY MOUTH ONCE A WEEK. TAKE WITH A FULL GLASS OF WATER  ON AN EMPTY STOMACH. Matt Song, MD Taking Active Family Member, Pharmacy Records            Med Note Merlyn Starring, Adonna Alamo May 05, 2023 12:15 AM) Take on Monday  apixaban  (ELIQUIS ) 5 MG TABS tablet [id number]  Take 1 tablet (5 mg total) by mouth 2 (two) times daily for 4 days. Tower, Manley Seeds, MD  Expired 06/10/23 2359            Med Note (Angeletta Goelz E   Mon Jul 29, 2023  3:33 PM) Patient states she takes 1 tablet 2 x per day.  Black Cohosh  40 MG CAPS [id number] Yes Take 1 capsule (40 mg total) by mouth daily. Constant, Peggy, MD Taking Active Family Member, Pharmacy Records  Budeson-Glycopyrrol-Formoterol  (BREZTRI  AEROSPHERE) 160-9-4.8 MCG/ACT Sudie Ely [id number] Yes Inhale 2 puffs into the lungs 2 (two) times daily. Tower, Manley Seeds, MD Taking Active Family Member, Pharmacy Records  buPROPion  (WELLBUTRIN  XL) 150 MG 24 hr tablet [id number] Yes Take 150 mg by mouth daily. [provider] Taking Active Family Member, Pharmacy Records  busPIRone  (BUSPAR ) 15 MG tablet [id number] Yes Take 15 mg by mouth 2 (two) times daily before a meal. [provider] Taking Active Family Member, Pharmacy Records  carvedilol  (COREG ) 3.125 MG tablet [id number] Yes Take 1 tablet (3.125 mg total) by mouth 2 (two) times daily with a meal. Tower, Manley Seeds, MD Taking Active   cholecalciferol  (VITAMIN D ) 25 MCG (1000 UNIT) tablet [id number] Yes Take 2,000 Units by mouth daily. 2  tablets daily [provider] Taking Active Family Member, Pharmacy Records  clobetasol  cream (TEMOVATE ) 0.05 % [id number]  APPLY SMALL AMOUNT TO AFFECTED AREAS TWO TO THREE TIMES PER WEEK Tower, Manley Seeds, MD  Active Family Member, Pharmacy Records           Med Note Evans Him Jul 29, 2023  3:34 PM) Patient states she  uses as needed.   cyanocobalamin  2000 MCG tablet [id number] Yes Take 500 mcg by mouth daily. [provider] Taking Active Family Member, Pharmacy Records  diclofenac  Sodium (VOLTAREN ) 1 % GEL [id number] Yes Apply 2 g topically 2 (two) times daily as needed (pain). Garnet Just, MD Taking Active  Family Member, Pharmacy Records  diphenhydrAMINE  (BENADRYL ) 25 MG tablet [id number] Yes Take 25 mg by mouth every 6 (six) hours as needed for allergies. [provider] Taking Active Family Member, Pharmacy Records  divalproex  (DEPAKOTE  ER) 500 MG 24 hr tablet [id number] Yes Take 500 mg by mouth at bedtime.  [provider] Taking Active Family Member, Pharmacy Records  docusate sodium  (COLACE) 100 MG capsule [id number] No Take 1 capsule (100 mg total) by mouth 2 (two) times daily as needed for mild constipation.  Patient not taking: Reported on 07/29/2023   Maggie Schooner, MD Not Taking Active   etanercept  (ENBREL  SURECLICK) 50 MG/ML injection [id number] Yes Inject 50 mg into the skin once a week. Matt Song, MD Taking Active   famotidine  (PEPCID ) 20 MG tablet [id number] No Take 1 tablet (20 mg total) by mouth daily.  Patient not taking: Reported on 07/29/2023   Maggie Schooner, MD Not Taking Active   fluticasone  (FLONASE ) 50 MCG/ACT nasal spray [id number] Yes PLACE 2 SPRAYS INTO THE NOSE DAILY. Faustina Hood, MD Taking Active Family Member, Pharmacy Records  HYDROcodone -acetaminophen  (NORCO/VICODIN) 5-325 MG tablet [id number] Yes Take 1 tablet by mouth every 12 (twelve) hours as needed for severe pain (pain score 7-10). Matt Song, MD Taking Active   hydrocortisone cream 1 % [id number] No Apply 1 Application topically 2 (two) times daily as needed for itching.  Patient not taking: Reported on 07/29/2023   [provider] Not Taking Active Family Member, Pharmacy Records           Med Note Merlyn Starring, NICOLE   Sun May 05, 2023 12:24 AM) Last dose unknown  ipratropium-albuterol  (DUONEB) 0.5-2.5 (3) MG/3ML SOLN [id number] Yes Take 3 mLs by nebulization 2 (two) times daily. Maggie Schooner, MD Taking Active   ketotifen  (ZADITOR ) 0.025 % ophthalmic solution [id number] Yes Place 1 drop into both eyes 2 (two) times daily as needed (allergies). [provider] Taking Active  Family Member, Pharmacy Records  levothyroxine  (SYNTHROID ) 50 MCG tablet [id number] Yes Take 1 tablet (50 mcg total) by mouth daily before breakfast. Tower, Manley Seeds, MD Taking Active   LORazepam  (ATIVAN ) 1 MG tablet [id number] Yes Take 1 tablet (1 mg total) by mouth at bedtime as needed for anxiety or sleep. Maggie Schooner, MD Taking Active   mirabegron  ER (MYRBETRIQ ) 25 MG TB24 tablet [id number] Yes TAKE 1 TABLET BY MOUTH TWICE A DAY Tower, Marne A, MD Taking Active   mirtazapine  (REMERON ) 30 MG tablet [id number] Yes Take 30 mg by mouth at bedtime.  [provider] Taking Active Family Member, Pharmacy Records           Med Note Vallarie Gauze, Gwynda Leriche   Fri Apr 04, 2018  8:11 AM)    ondansetron  (ZOFRAN -ODT) 4  MG disintegrating tablet [id number] Yes Take 1 tablet (4 mg total) by mouth every 8 (eight) hours as needed for nausea or vomiting. Caution of sedation Tower, Manley Seeds, MD Taking Active   predniSONE  (DELTASONE ) 10 MG tablet [id number] Yes TAKE 1 TABLET (10 MG TOTAL) BY MOUTH DAILY WITH BREAKFAST. Matt Song, MD Taking Active Family Member, Pharmacy Records  Rhubarb (ESTROVEN COMPLETE) 4 MG TABS [id number] Yes Take 4 mg by mouth daily. Constant, Peggy, MD Taking Active Family Member, Pharmacy Records  sertraline  (ZOLOFT ) 100 MG tablet [id number] Yes Take 100 mg by mouth daily.  [provider] Taking Active Family Member, Pharmacy Records           Med Note Donnie Galea   Fri Apr 04, 2018  8:12 AM)    simvastatin  (ZOCOR ) 20 MG tablet [id number] Yes TAKE 1 TABLET BY MOUTH AT BEDTIME Tower, Manley Seeds, MD Taking Active Family Member, Pharmacy Records  Spacer/Aero-Holding Chambers (AEROCHAMBER PLUS) inhaler [id number]  Use with inhaler Ethlyn Herd, MD  Active Family Member, Pharmacy Records  thiamine  (VITAMIN B-1) 100 MG tablet [id number] No Take 1 tablet (100 mg total) by mouth daily.  Patient not taking: Reported on 07/29/2023   Maggie Schooner, MD Not Taking Active   traMADol   (ULTRAM ) 50 MG tablet [id number] Yes Take 1 tablet (50 mg total) by mouth every 6 (six) hours as needed for moderate pain (pain score 4-6). Maggie Schooner, MD Taking Active             Recommendation:   PCP Follow-up  Follow Up Plan:   Telephone follow-up in 1 month. Patient transferring to Michele Ahle, RN for ongoing case management follow up. Patient agreeable to transfer and ongoing case management.   Verba Girt RN, BSN, CCM [employer], Population Health Case Manager Phone: [phone]

## 2023-07-29 NOTE — Telephone Encounter (Signed)
 Last Fill: 02/11/2023  Labs: 07/02/2023 CMP Glucose 107 BUN 36 Creat 1.12 eGFR 52 BUN/Creatinine Ratio 32  06/06/2023 CBC RBC 3.86 RDW 16.1  02/12/2023 Vitamin D  42  08/08/2022 Bone Density  Next Visit: 07/30/2023  Last Visit: 07/02/2023  DX: not mentioned  Current Dose per office note 07/02/2023: not mentioned  Okay to refill Fosamax ?

## 2023-07-30 ENCOUNTER — Ambulatory Visit: Attending: Internal Medicine | Admitting: Internal Medicine

## 2023-07-30 ENCOUNTER — Telehealth: Payer: Self-pay

## 2023-07-30 ENCOUNTER — Encounter: Payer: Self-pay | Admitting: Internal Medicine

## 2023-07-30 VITALS — BP 138/70 | Resp 14 | Ht 67.0 in | Wt 223.0 lb

## 2023-07-30 DIAGNOSIS — Z79899 Other long term (current) drug therapy: Secondary | ICD-10-CM

## 2023-07-30 DIAGNOSIS — G894 Chronic pain syndrome: Secondary | ICD-10-CM | POA: Diagnosis not present

## 2023-07-30 DIAGNOSIS — M0600 Rheumatoid arthritis without rheumatoid factor, unspecified site: Secondary | ICD-10-CM | POA: Insufficient documentation

## 2023-07-30 DIAGNOSIS — Z7952 Long term (current) use of systemic steroids: Secondary | ICD-10-CM | POA: Insufficient documentation

## 2023-07-30 MED ORDER — HYDROCODONE-ACETAMINOPHEN 5-325 MG PO TABS: 1.0000 | ORAL_TABLET | Freq: Every day | ORAL | 0 refills | Status: DC | PRN

## 2023-07-30 MED ORDER — PREDNISONE 10 MG PO TABS
10.0000 mg | ORAL_TABLET | Freq: Every day | ORAL | 0 refills | Status: DC
Start: 2023-07-30 — End: 2023-11-25

## 2023-07-30 NOTE — Telephone Encounter (Signed)
 Pending OV note to be signed, pt is Actemra SQ new start  Dose: 162mg  subcut every 7 days (weight > 100kg)  Geraldene Kleine, PharmD, MPH, BCPS, CPP Clinical Pharmacist (Rheumatology and Pulmonology)

## 2023-07-30 NOTE — Patient Instructions (Signed)
 Tocilizumab Injection What is this medication? TOCILIZUMAB (TOE si LIZ ue mab) treats autoimmune conditions, such as arthritis. It works by slowing down an overactive immune system. It may also be used to treat severe COVID-19 in people who are hospitalized. It is a monoclonal antibody. This medicine may be used for other purposes; ask your health care provider or pharmacist if you have questions. COMMON BRAND NAME(S): Actemra, TOFIDENCE, [place]enne What should I tell my care team before I take this medication? They need to know if you have any of these conditions: Cancer Diabetes Heart disease History of or current hepatitis B infection High blood pressure High cholesterol Immune system problems Infection Liver disease Low blood counts, such as low white cell, platelet, or red cell counts Multiple sclerosis Recent or upcoming vaccine Stomach or intestine problems Stroke An unusual or allergic reaction to tocilizumab, other medications, foods, dyes, or preservatives Pregnant or trying to get pregnant Breast-feeding How should I use this medication? This medication is injected into a vein or under the skin. It may be given by your care team in a hospital or clinic setting. It may also be given at home. If you get this medication at home, you will be taught how to prepare and give it. Use it exactly as directed. Take it as directed on the prescription label. Keep taking it unless your care team tells you to stop. If you use a pen, be sure to take off the outer needle cover before using the dose. It is important that you put your used needles and syringes in a special sharps container. Do not put them in a trash can. If you do not have a sharps container, call your pharmacist or care team to get one. A special MedGuide will be given to you by the pharmacist with each prescription and refill. Be sure to read this information carefully each time. Talk to your care team about the use of this  medication in children. While it may be prescribed for children as young as 2 years for selected conditions, precautions do apply. Overdosage: If you think you have taken too much of this medicine contact a poison control center or emergency room at once. NOTE: This medicine is only for you. Do not share this medicine with others. What if I miss a dose? If you get this medication at the hospital or clinic: It is important not to miss your dose. Call your care team if you are unable to keep an appointment. If you give yourself this medication at home: If you miss a dose, take it as soon as you can. If it is almost time for your next dose, take only that dose. Do not take double or extra doses. Call your care team with questions. What may interact with this medication? Do not take this medication with any of the following: Live virus vaccines This medication may also interact with the following: Biologic medications, such as abatacept, adalimumab, anakinra, certolizumab, etanercept , golimumab, infliximab, rituximab, secukinumab, ustekinumab Certain medications for cholesterol, such as atorvastatin, lovastatin, simvastatin  Cyclosporine Estrogen or progestin hormones Omeprazole Steroid medications, such as prednisone  or cortisone Theophylline Vaccines Warfarin This list may not describe all possible interactions. Give your health care provider a list of all the medicines, herbs, non-prescription drugs, or dietary supplements you use. Also tell them if you smoke, drink alcohol, or use illegal drugs. Some items may interact with your medicine. What should I watch for while using this medication? Visit your care team for regular  checks on your progress. Your condition will be monitored carefully while you are receiving this medication. Tell your care team if your symptoms do not start to get better or if they get worse. You may need blood work done while taking this medication. You will be tested for  tuberculosis (TB) before you start this medication. If your care team prescribes any medication for TB, you should start taking the TB medication before starting this medication. Make sure to finish the full course of TB medication. This medication may increase your risk of getting an infection. Call your care team for advice if you get a fever, chills, sore throat, or other symptoms of a cold or flu. Do not treat yourself. Try to avoid being around people who are sick. Talk to your care team about your risk of cancer. You may be more at risk for certain types of cancers if you take this medication. What side effects may I notice from receiving this medication? Side effects that you should report to your care team as soon as possible: Allergic reactions--skin rash, itching, hives, swelling of the face, lips, tongue, or throat Infection--fever, chills, cough, sore throat, wounds that don't heal, pain or trouble when passing urine, general feeling of discomfort or being unwell Liver injury--right upper belly pain, loss of appetite, nausea, light-colored stool, dark yellow or brown urine, yellowing skin or eyes, unusual weakness or fatigue Rash, fever, and swollen lymph nodes Stomach pain that is severe, does not go away, or gets worse Unusual bruising or bleeding Side effects that usually do not require medical attention (report these to your care team if they continue or are bothersome): Dizziness Headache Pain, redness, or irritation at injection site Runny or stuffy nose Sore throat Stomach pain This list may not describe all possible side effects. Call your doctor for medical advice about side effects. You may report side effects to FDA at 1-800-FDA-1088. Where should I keep my medication? Keep out of the reach of children and pets. You will be instructed on how to store this medication. Get rid of any unused medication after the expiration date on the label. To get rid of medications that  are no longer needed or have expired: Take the medication to a medication take-back program. Check with your pharmacy or law enforcement to find a location. If you cannot return the medication, ask your pharmacist or care team how to get rid of this medication safely. NOTE: This sheet is a summary. It may not cover all possible information. If you have questions about this medicine, talk to your doctor, pharmacist, or health care provider.  2025 Elsevier/Gold Standard (2023-01-31 00:00:00)

## 2023-07-31 LAB — C-REACTIVE PROTEIN: CRP: 26.2 mg/L — ABNORMAL HIGH (ref <8.0)

## 2023-07-31 LAB — CBC WITH DIFFERENTIAL/PLATELET
Absolute Lymphocytes: 1510 {cells}/uL (ref 850–3900)
Absolute Monocytes: 282 {cells}/uL (ref 200–950)
Basophils Absolute: 19 {cells}/uL (ref 0–200)
Basophils Relative: 0.3 %
Eosinophils Absolute: 19 {cells}/uL (ref 15–500)
Eosinophils Relative: 0.3 %
HCT: 37.8 % (ref 35.0–45.0)
Hemoglobin: 11.8 g/dL (ref 11.7–15.5)
MCH: 31.3 pg (ref 27.0–33.0)
MCHC: 31.2 g/dL — ABNORMAL LOW (ref 32.0–36.0)
MCV: 100.3 fL — ABNORMAL HIGH (ref 80.0–100.0)
MPV: 10.6 fL (ref 7.5–12.5)
Monocytes Relative: 4.4 %
Neutro Abs: 4570 {cells}/uL (ref 1500–7800)
Neutrophils Relative %: 71.4 %
Platelets: 198 10*3/uL (ref 140–400)
RBC: 3.77 10*6/uL — ABNORMAL LOW (ref 3.80–5.10)
RDW: 14.5 % (ref 11.0–15.0)
Total Lymphocyte: 23.6 %
WBC: 6.4 10*3/uL (ref 3.8–10.8)

## 2023-07-31 LAB — SEDIMENTATION RATE: Sed Rate: 36 mm/h — ABNORMAL HIGH (ref 0–30)

## 2023-08-02 ENCOUNTER — Encounter: Payer: Self-pay | Admitting: Family Medicine

## 2023-08-07 ENCOUNTER — Telehealth: Payer: Self-pay | Admitting: Family Medicine

## 2023-08-07 DIAGNOSIS — J449 Chronic obstructive pulmonary disease, unspecified: Secondary | ICD-10-CM

## 2023-08-07 NOTE — Telephone Encounter (Signed)
 Copied from CRM [id number]. Topic: Clinical - Medication Question >> Aug 07, 2023 11:11 AM Baldo Levan wrote: Reason for CRM: Patient called in stating she received a letter from [facility] [place], patient needs an updated inhaler prescription sent to them. Patient provided PET_ID - [id number]. Loris Ros the pharmacist helped the patient with this the last time. Please contact the patient with any questions.

## 2023-08-08 MED ORDER — BREZTRI AEROSPHERE 160-9-4.8 MCG/ACT IN AERO: 2.0000 | INHALATION_SPRAY | Freq: Two times a day (BID) | RESPIRATORY_TRACT | 4 refills | Status: AC

## 2023-08-08 NOTE — Addendum Note (Signed)
 Addended by: Llana Rile R on: 08/08/2023 11:22 AM   Modules accepted: Orders

## 2023-08-08 NOTE — Addendum Note (Signed)
 Addended by: Deri Fleet A on: 08/08/2023 02:51 PM   Modules accepted: Orders

## 2023-08-09 ENCOUNTER — Telehealth: Payer: Self-pay | Admitting: *Deleted

## 2023-08-09 NOTE — Telephone Encounter (Signed)
 Patient states she is having cramping and spasms in her legs and left shoulder. Patient states it started about 1.5 weeks. Patient states the cramping and spasms in her legs are mainly at night. Patient states it happened every night. Patient would like to know if this could be a side effect of the Enbrel . Patient states her last injection was 08/05/2023. Patient states she is having pain in her wrists. Patient states she is taking Prednisone  10 mg daily. Patient would like for you to advise.

## 2023-08-10 ENCOUNTER — Encounter: Payer: Self-pay | Admitting: Family Medicine

## 2023-08-10 DIAGNOSIS — R195 Other fecal abnormalities: Secondary | ICD-10-CM

## 2023-08-12 ENCOUNTER — Other Ambulatory Visit: Payer: Self-pay

## 2023-08-12 ENCOUNTER — Institutional Professional Consult (permissible substitution) (INDEPENDENT_AMBULATORY_CARE_PROVIDER_SITE_OTHER): Payer: Medicare Other

## 2023-08-12 ENCOUNTER — Other Ambulatory Visit (HOSPITAL_COMMUNITY): Payer: Self-pay

## 2023-08-12 DIAGNOSIS — R195 Other fecal abnormalities: Secondary | ICD-10-CM | POA: Insufficient documentation

## 2023-08-12 MED ORDER — FAMOTIDINE 20 MG PO TABS: 20.0000 mg | ORAL_TABLET | Freq: Every day | ORAL | 1 refills | Status: DC

## 2023-08-12 MED ORDER — ACTEMRA ACTPEN 162 MG/0.9ML ~~LOC~~ SOAJ
162.0000 mg | SUBCUTANEOUS | 2 refills | Status: DC
  Filled 2023-08-12: qty 3.6, 28d supply, fill #0
  Filled 2023-09-27: qty 3.6, 28d supply, fill #1
  Filled 2023-10-31: qty 3.6, 28d supply, fill #2

## 2023-08-12 NOTE — Progress Notes (Signed)
 Specialty Pharmacy Initial Fill Coordination Note  Lori Orozco is a 73 y.o. female contacted today regarding initial fill of specialty medication(s) Tocilizumab (Actemra ACTPen)   Patient requested Delivery   Delivery date: 08/14/23   Verified address: [address]   [place] [address]   Medication will be filled on 08/13/23.   Patient is aware of $0 copayment.

## 2023-08-12 NOTE — Telephone Encounter (Signed)
 Received notification from [facility] regarding a prior authorization for ACTEMRA SQ. Authorization has been APPROVED from 02/27/2023 to 08/11/2024. Approval letter sent to scan center.  Per test claim, copay for 28 days supply is $0  Patient can fill through [facility]: [phone]   Rx sent to [facility] for onboarding. New start visit not needed per Dr.Rice  MyChart message sent to patient with update  Geraldene Kleine, PharmD, MPH, BCPS, CPP Clinical Pharmacist (Rheumatology and Pulmonology)

## 2023-08-12 NOTE — Telephone Encounter (Signed)
 Submitted a Prior Authorization request to [facility] for ACTEMRA SQ via CoverMyMeds. Will update once we receive a response.  Key: [id number]

## 2023-08-13 NOTE — Telephone Encounter (Signed)
 Was not aware pt had sent message regarding colonoscopy. Please also see my message on her result notes also regarding this

## 2023-08-13 NOTE — Telephone Encounter (Signed)
 Patient contacted the office again, please advise

## 2023-08-14 ENCOUNTER — Telehealth: Payer: Self-pay

## 2023-08-14 NOTE — Progress Notes (Signed)
 Patient switching from Enbrel  to Actemra SQ due to waning response to Enbrel . She remains on prednisone  10mg  daily  Dose: 162mg  subcut every 7 days due to weight > 100kg  Geraldene Kleine, PharmD, MPH, BCPS, CPP Clinical Pharmacist (Rheumatology and Pulmonology)

## 2023-08-14 NOTE — Progress Notes (Signed)
 Complex Care Management Note  Care Guide Note 08/14/2023 Name: SHAVAWN STOBAUGH MRN: [id number] DOB: 04/10/1950  URA YINGLING is a 73 y.o. year old female who sees Tower, Manley Seeds, MD for primary care. I reached out to Augustina Block by phone today to offer complex care management services.  Ms. Honeycutt was given information about Complex Care Management services today including:   The Complex Care Management services include support from the care team which includes your Nurse Care Manager, Clinical Social Worker, or Pharmacist.  The Complex Care Management team is here to help remove barriers to the health concerns and goals most important to you. Complex Care Management services are voluntary, and the patient may decline or stop services at any time by request to their care team member.   Complex Care Management Consent Status: Patient agreed to services and verbal consent obtained.   Follow up plan:  Telephone appointment with complex care management team member scheduled for:  08/23/23 at 2:00 p.m.   Encounter Outcome:  Patient Scheduled  [facility]  [facility], [facility] Care Management Assistant Direct Dial: [phone]  Fax: [phone]

## 2023-08-15 ENCOUNTER — Telehealth: Payer: Self-pay

## 2023-08-15 NOTE — Telephone Encounter (Signed)
 Patient contacted the office again. Please advise.

## 2023-08-15 NOTE — Progress Notes (Signed)
   Telephone encounter was:  Unsuccessful.  08/15/2023 Name: Lori Orozco MRN: [id number] DOB: Jul 04, 1950  Unsuccessful outbound call made today to assist with:  Food Insecurity  Outreach Attempt:  1st Attempt  No answer and unable to leave a message   [name] [facility]  [facility] Guide, Phone: [phone] Fax: [phone] Website: [email or web address]

## 2023-08-16 ENCOUNTER — Other Ambulatory Visit: Payer: Self-pay

## 2023-08-16 ENCOUNTER — Telehealth: Payer: Self-pay

## 2023-08-16 MED ORDER — FAMOTIDINE 20 MG PO TABS: 20.0000 mg | ORAL_TABLET | Freq: Every day | ORAL | 1 refills | Status: DC

## 2023-08-16 NOTE — Progress Notes (Signed)
   Telephone encounter was:  Successful.  Complex Care Management Note Care Guide Note  08/16/2023 Name: [name] MRN: [id number] DOB: 1950-03-25  Lori Orozco is a 73 y.o. year old female who is a primary care patient of Tower, Manley Seeds, MD . The community resource team was consulted for assistance with Food Insecurity  SDOH screenings and interventions completed:  Yes  Social Drivers of Health From This Encounter   Food Insecurity: Food Insecurity Present (08/16/2023)   Hunger Vital Sign    Ran Out of Food in the Last Year: Sometimes true  Financial Resource Strain: High Risk (08/16/2023)   Overall Financial Resource Strain (CARDIA)    Difficulty of Paying Living Expenses: Hard    SDOH Interventions Today    Flowsheet Row Most Recent Value  SDOH Interventions   Food Insecurity Interventions Community Resources Provided, [id number] Referral  Financial Strain Interventions Community Resources Provided, [id number] Referral     Care guide performed the following interventions: Patient provided with information about care guide support team and interviewed to confirm resource needs.Pt is having financial strain and cant afford to buy food after all bills have been paid. I have given resources over the phone and the pt requested I mail resources as well.   Follow Up Plan:  No follow up needed   Encounter Outcome:  Patient Visit Completed    [name] [facility]  [facility] Guide, Phone: [phone] Fax: [phone] Website: [email or web address]

## 2023-08-22 ENCOUNTER — Encounter (INDEPENDENT_AMBULATORY_CARE_PROVIDER_SITE_OTHER): Payer: Self-pay | Admitting: Otolaryngology

## 2023-08-22 ENCOUNTER — Ambulatory Visit (INDEPENDENT_AMBULATORY_CARE_PROVIDER_SITE_OTHER): Admitting: Otolaryngology

## 2023-08-22 VITALS — BP 130/73 | HR 85 | Ht 67.0 in

## 2023-08-22 DIAGNOSIS — K1379 Other lesions of oral mucosa: Secondary | ICD-10-CM | POA: Diagnosis not present

## 2023-08-22 DIAGNOSIS — H6123 Impacted cerumen, bilateral: Secondary | ICD-10-CM

## 2023-08-22 DIAGNOSIS — H9190 Unspecified hearing loss, unspecified ear: Secondary | ICD-10-CM

## 2023-08-22 NOTE — Progress Notes (Signed)
 Dear Dr. Randeen, Here is my assessment for our mutual patient, Lori Orozco. Thank you for allowing me the opportunity to care for your patient. Please do not hesitate to contact me should you have any other questions. Sincerely, Dr. Eldora Blanch  Otolaryngology Clinic Note Referring provider: Dr. Randeen HPI:  Lori Orozco is a 73 y.o. female kindly referred by Dr. Randeen for evaluation of cerumen impaction and palate lesion  Initial visit (07/2023): Initially noted to have a palate lesion around 2017, saw Dr. Lida. Excisional biopsy performed, which did not reveal carcinoma. Since then has been followed by him intermittently for this and cerumen. Denies any new oral lesions, dysphagia, odynophagia, ear pain, palate numbness, nasal obstruction, epistaxis, unintentional weight loss, changes in voice, shortness of breath, hemoptysis, neck masses. She also reports issues with cerumen bilaterally getting regular cleanings. Intermittent fullness bilateral currently, mild decline in hearing (gradual). Last HT reports about a year ago.  Patient denies: ear pain, vertigo, drainage Patient also denies barotrauma, vestibular suppressant use, ototoxic medication use Prior ear surgery: denies  Tobacco: former, quit 2015 - 86 pack year  PMHx: RA (on prednisone  10mg  daily), CKD, MDD, HTN, Chronic pain, COPD, Weakness  Independent Review of Additional Tests or Records:  Dr. Randeen (03/06/2023): saw Dr. Lida q6h months to clean ears and check palatal lesion.  Dr. Lida ([facility]) 03/07/2023: noted bilateral impacted cerumen, which was cleaned. 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 Also noted rhinitis; Dx: Palatal lesion, cerumen impaction CBC 07/30/2023: WBC 6.4, Hgb 11.8; CRP and ESR 07/30/2023: elevated (CRP 26); CMP 07/02/2023: BUN/Cr 36/1.12 PMH/Meds/All/SocHx/FamHx/ROS:   Past Medical History:  Diagnosis Date   Allergy Hayfever   1958   Amaurosis  fugax    Anemia    hx   Anxiety    Arthritis    Asthma    Blood transfusion without reported diagnosis    Cataract    Chronic fatigue syndrome    Chronic kidney disease    frequency, nephritis when 73 yrs old   COPD (chronic obstructive pulmonary disease) (HCC)    COVID-19    Depression    Diverticulitis    Emphysema of lung (HCC)    Fibromyalgia    GERD (gastroesophageal reflux disease)    occ   H/O hiatal hernia    History of bronchitis    Hyperlipidemia    Hypothyroidism    Interstitial cystitis    Irritable bowel syndrome    Lichen sclerosus    Lumbar herniated disc    Migraines    Motor nerve conduction block 11/2021   Osteoporosis 09/10/2022   Pneumonia    PONV (postoperative nausea and vomiting)    Shortness of breath    exertion    Thyroid  disease    Graves   Urinary frequency    Urinary tract infection    Vertigo      Past Surgical History:  Procedure Laterality Date   ABDOMINAL HYSTERECTOMY     bladder sugery      CAROTID DOPPLAR     COLONOSCOPY  05/26/2010   avms- otherwise nl , re check 10y   DEXA-OSTEOPENIA     DOPPLER ECHOCARDIOGRAPHY     elbow surgery     EPICONDYLITIS     EYE SURGERY Bilateral    cataracts   FOOT SURGERY Bilateral    I & D EXTREMITY Right 09/01/2020   Procedure: TYNOSYNOVECTOMY;  Surgeon: Vernetta Lonni GRADE, MD;  Location: [facility] OR;  Service: Orthopedics;  Laterality: Right;   IMPLANTATION VAGAL NERVE STIMULATOR  03/2022   JOINT REPLACEMENT     KNEE SURGERY     Left cartilage   lipoma in second finger right hand     MOUTH SURGERY  08/22/2022   PLANTAR FASCIA SURGERY Left    REVERSE SHOULDER ARTHROPLASTY Left 02/02/2020   Procedure: LEFT REVERSE SHOULDER ARTHROPLASTY;  Surgeon: Addie Cordella Hamilton, MD;  Location: [facility] OR;  Service: Orthopedics;  Laterality: Left;   ROTATOR CUFF REPAIR Left 12/2017   TEMPOROMANDIBULAR JOINT SURGERY     TONSILLECTOMY     TOTAL HIP ARTHROPLASTY Right 03/20/2013   Procedure: Right TOTAL  HIP ARTHROPLASTY;  Surgeon: Jerona LULLA Sage, MD;  Location: [facility] OR;  Service: Orthopedics;  Laterality: Right;  Right Total Hip Arthroplasty   TOTAL KNEE ARTHROPLASTY Left 01/28/2015   Procedure: LEFT TOTAL KNEE ARTHROPLASTY;  Surgeon: Lonni CINDERELLA Poli, MD;  Location: [facility] ORS;  Service: Orthopedics;  Laterality: Left;   TRIGGER FINGER RELEASE Right    TROCHANTERIC BURSA EXCISION Right 05/03/2016   Dr. Poli Lonni   WRIST ARTHROSCOPY Right    ligament tear    Family History  Problem Relation Age of Onset   Arthritis Mother    Hypertension Mother    Kidney disease Mother    Heart disease Mother    Stroke Mother    Cancer Father        Bladder   Alcohol abuse Father    Arthritis Sister    Heart disease Sister    Colon cancer Other    Cancer - Lung Cousin    Arthritis Maternal Grandmother    Hearing loss Maternal Grandfather    Varicose Veins Maternal Uncle      Social Connections: Unknown (07/15/2023)   Social Connection and Isolation Panel    Frequency of Communication with Friends and Family: Twice a week    Frequency of Social Gatherings with Friends and Family: Once a week    Attends Religious Services: Patient declined    Database administrator or Organizations: No    Attends Engineer, structural: Patient unable to answer    Marital Status: Never married      Current Outpatient Medications:    acetaminophen  (TYLENOL ) 500 MG tablet, Take 500 mg by mouth every 6 (six) hours as needed., Disp: , Rfl:    alendronate  (FOSAMAX ) 70 MG tablet, TAKE 1 TABLET BY MOUTH ONCE A WEEK. TAKE WITH A FULL GLASS OF WATER  ON AN EMPTY STOMACH., Disp: 12 tablet, Rfl: 0   apixaban  (ELIQUIS ) 5 MG TABS tablet, Take 1 tablet (5 mg total) by mouth 2 (two) times daily for 4 days., Disp: 180 tablet, Rfl: 1   Black Cohosh  40 MG CAPS, Take 1 capsule (40 mg total) by mouth daily., Disp: 30 capsule, Rfl: 11   budesonide -glycopyrrolate -formoterol  (BREZTRI  AEROSPHERE) 160-9-4.8 MCG/ACT  AERO inhaler, Inhale 2 puffs into the lungs 2 (two) times daily., Disp: 3 each, Rfl: 4   buPROPion  (WELLBUTRIN  XL) 150 MG 24 hr tablet, Take 150 mg by mouth daily., Disp: , Rfl:    busPIRone  (BUSPAR ) 15 MG tablet, Take 15 mg by mouth 2 (two) times daily before a meal., Disp: , Rfl:    carvedilol  (COREG ) 3.125 MG tablet, Take 1 tablet (3.125 mg total) by mouth 2 (two) times daily with a meal., Disp: 90 tablet, Rfl: 3   cholecalciferol  (VITAMIN D ) 25 MCG (1000 UNIT) tablet, Take 2,000 Units by mouth daily. 2 tablets daily, Disp: , Rfl:  cyanocobalamin  2000 MCG tablet, Take 500 mcg by mouth daily., Disp: , Rfl:    diclofenac  Sodium (VOLTAREN ) 1 % GEL, Apply 2 g topically 2 (two) times daily as needed (pain)., Disp: , Rfl:    diphenhydrAMINE  (BENADRYL ) 25 MG tablet, Take 25 mg by mouth every 6 (six) hours as needed for allergies., Disp: , Rfl:    divalproex  (DEPAKOTE  ER) 500 MG 24 hr tablet, Take 500 mg by mouth at bedtime. , Disp: , Rfl:    famotidine  (PEPCID ) 20 MG tablet, Take 1 tablet (20 mg total) by mouth daily., Disp: 90 tablet, Rfl: 1   fluticasone  (FLONASE ) 50 MCG/ACT nasal spray, PLACE 2 SPRAYS INTO THE NOSE DAILY., Disp: 16 g, Rfl: 11   HYDROcodone -acetaminophen  (NORCO/VICODIN) 5-325 MG tablet, Take 1 tablet by mouth daily as needed for severe pain (pain score 7-10)., Disp: 30 tablet, Rfl: 0   ipratropium-albuterol  (DUONEB) 0.5-2.5 (3) MG/3ML SOLN, Take 3 mLs by nebulization 2 (two) times daily., Disp: , Rfl:    ketotifen  (ZADITOR ) 0.025 % ophthalmic solution, Place 1 drop into both eyes 2 (two) times daily as needed (allergies)., Disp: , Rfl:    levothyroxine  (SYNTHROID ) 50 MCG tablet, Take 1 tablet (50 mcg total) by mouth daily before breakfast., Disp: 90 tablet, Rfl: 3   LORazepam  (ATIVAN ) 1 MG tablet, Take 1 tablet (1 mg total) by mouth at bedtime as needed for anxiety or sleep., Disp: 7 tablet, Rfl: 0   mirabegron  ER (MYRBETRIQ ) 25 MG TB24 tablet, TAKE 1 TABLET BY MOUTH TWICE A DAY,  Disp: 180 tablet, Rfl: 1   mirtazapine  (REMERON ) 30 MG tablet, Take 30 mg by mouth at bedtime. , Disp: , Rfl:    ondansetron  (ZOFRAN -ODT) 4 MG disintegrating tablet, Take 1 tablet (4 mg total) by mouth every 8 (eight) hours as needed for nausea or vomiting. Caution of sedation, Disp: 20 tablet, Rfl: 0   predniSONE  (DELTASONE ) 10 MG tablet, Take 1 tablet (10 mg total) by mouth daily with breakfast., Disp: 90 tablet, Rfl: 0   Rhubarb (ESTROVEN COMPLETE) 4 MG TABS, Take 4 mg by mouth daily., Disp: 30 tablet, Rfl: 11   sertraline  (ZOLOFT ) 100 MG tablet, Take 100 mg by mouth daily. , Disp: , Rfl:    simvastatin  (ZOCOR ) 20 MG tablet, TAKE 1 TABLET BY MOUTH AT BEDTIME, Disp: 90 tablet, Rfl: 0   Spacer/Aero-Holding Chambers (AEROCHAMBER PLUS) inhaler, Use with inhaler, Disp: 1 each, Rfl: 2   Tocilizumab (ACTEMRA ACTPEN) 162 MG/0.9ML SOAJ, Inject 162 mg into the skin every 7 (seven) days., Disp: 3.6 mL, Rfl: 2   clobetasol  cream (TEMOVATE ) 0.05 %, APPLY SMALL AMOUNT TO AFFECTED AREAS TWO TO THREE TIMES PER WEEK (Patient not taking: Reported on 08/22/2023), Disp: 30 g, Rfl: 0   docusate sodium  (COLACE) 100 MG capsule, Take 1 capsule (100 mg total) by mouth 2 (two) times daily as needed for mild constipation. (Patient not taking: Reported on 08/22/2023), Disp: , Rfl:    hydrocortisone cream 1 %, Apply 1 Application topically 2 (two) times daily as needed for itching. (Patient not taking: Reported on 08/22/2023), Disp: , Rfl:    thiamine  (VITAMIN B-1) 100 MG tablet, Take 1 tablet (100 mg total) by mouth daily. (Patient not taking: Reported on 08/22/2023), Disp: , Rfl:    traMADol  (ULTRAM ) 50 MG tablet, Take 1 tablet (50 mg total) by mouth every 6 (six) hours as needed for moderate pain (pain score 4-6). (Patient not taking: Reported on 08/22/2023), Disp: 15 tablet, Rfl: 0  Physical Exam:   BP 130/73 (BP Location: Left Arm, Patient Position: Sitting, Cuff Size: Large)   Pulse 85   Ht 5' 7 (1.702 m)   SpO2 (!) 86%    BMI 34.93 kg/m   Salient findings:  CN II-XII intact Given history and complaints, ear microscopy was indicated and performed for evaluation with findings as below in physical exam section and in procedures; bilateral cerumen impaction, after clearance, Bilateral EAC clear and TM intact with well pneumatized middle ear spaces Weber 512: mid Rinne 512: AC > BC b/l  Anterior rhinoscopy: Septum relatively midline; bilateral inferior turbinates without significant hypertrophy No lesions of oral cavity/oropharynx - well healed mid-palate lesion, no numbness. No other oral lesions noted. No obviously palpable neck masses/lymphadenopathy/thyromegaly No respiratory distress or stridor  Seprately Identifiable Procedures:  Prior to initiating any procedures, risks/benefits/alternatives were explained to the patient and verbal consent obtained. Procedure: Bilateral ear microscopy and cerumen removal using microscope (CPT [phone]) - Mod 25 Pre-procedure diagnosis: Cerumen impaction bilateral external ears Post-procedure diagnosis: same Indication: bilateral cerumen impaction; given patient's otologic complaints and history as well as for improved and comprehensive examination of external ear and tympanic membrane, bilateral otologic examination using microscope was performed and impacted cerumen removed  Procedure: Patient was placed semi-recumbent. Both ear canals were examined using the microscope with findings above. Impacted Cerumen removed on left and on right using suction and currette with improvement in EAC examination and patency. Patient tolerated the procedure well.      Impression & Plans:  Lori Orozco is a 73 y.o. female with:  1. Lesion of palate   2. Bilateral impacted cerumen   3. Subjective hearing loss    Well healed palate, lesion excised around 8 years ago per records. Not carcinoma per Dr. Coit notes. Would recommend no intervention. Cerumen cleaned today and  ceruminosis management strategies discussed. Will get audio at next visit  F/u in 6 months, sooner as necessary  See below regarding exact medications prescribed this encounter including dosages and route: No orders of the defined types were placed in this encounter.     Thank you for allowing me the opportunity to care for your patient. Please do not hesitate to contact me should you have any other questions.  Sincerely, Eldora Blanch, MD Otolaryngologist (ENT), [facility] Phone: [phone] Fax: [phone]  08/24/2023, 6:07 PM   MDM:  Level 4 - [id number] Complexity/Problems addressed: mod - chronic problems, stable Data complexity: mod - independent review of notes, labs, ordering test - Morbidity: low

## 2023-08-23 ENCOUNTER — Other Ambulatory Visit: Payer: Self-pay | Admitting: *Deleted

## 2023-08-23 NOTE — Patient Outreach (Addendum)
 Complex Care Management   Visit Note  08/23/23  Name:  Lori Orozco MRN: [id number] DOB: 02/09/1951  Situation: Referral received for Complex Care Management related to Mental/Behavioral Health diagnosis depression and anxiety I obtained verbal consent from Patient.  Visit completed with patient  on the phone  Background:   Past Medical History:  Diagnosis Date   Allergy Hayfever   1958   Amaurosis fugax    Anemia    hx   Anxiety    Arthritis    Asthma    Blood transfusion without reported diagnosis    Cataract    Chronic fatigue syndrome    Chronic kidney disease    frequency, nephritis when 73 yrs old   COPD (chronic obstructive pulmonary disease) (HCC)    COVID-19    Depression    Diverticulitis    Emphysema of lung (HCC)    Fibromyalgia    GERD (gastroesophageal reflux disease)    occ   H/O hiatal hernia    History of bronchitis    Hyperlipidemia    Hypothyroidism    Interstitial cystitis    Irritable bowel syndrome    Lichen sclerosus    Lumbar herniated disc    Migraines    Motor nerve conduction block 11/2021   Osteoporosis 09/10/2022   Pneumonia    PONV (postoperative nausea and vomiting)    Shortness of breath    exertion    Thyroid  disease    Graves   Urinary frequency    Urinary tract infection    Vertigo     Assessment: Patient Reported Symptoms:  Cognitive Cognitive Status: Alert and oriented to person, place, and time, Insightful and able to interpret abstract concepts, Normal speech and language skills Cognitive/Intellectual Conditions Management [RPT]: None reported or documented in medical history or problem list   Health Maintenance Behaviors: Annual physical exam Healing Pattern: Average  Neurological Neurological Review of Symptoms: Numbness Oher Neurological Symptoms/Conditions [RPT]: neuropathy all over body, mainly on feet Neurological Management Strategies: Medication therapy, Routine screening  HEENT HEENT Symptoms  Reported: No symptoms reported      Cardiovascular Cardiovascular Symptoms Reported: No symptoms reported    Respiratory Respiratory Symptoms Reported: No symptoms reported    Endocrine Endocrine Symptoms Reported: No symptoms reported    Gastrointestinal        Genitourinary Genitourinary Symptoms Reported: Incontinence Genitourinary Management Strategies: Incontinence garment/pad  Integumentary      Musculoskeletal Musculoskelatal Symptoms Reviewed: Other (right hand surgery) Other Musculoskeletal Symptoms: patient reprots having arthritis in right hand-will have surgery 09/10/23 Musculoskeletal Management Strategies: Routine screening, Medication therapy, Medical device      Psychosocial Psychosocial Symptoms Reported: Anxiety - if selected complete GAD, Depression - if selected complete PHQ 2-9 Additional Psychological Details: patient reports long history of depression and anxiety-currently followed by [facility] for medication management Behavioral Management Strategies: Adequate rest, Coping strategies, Medication therapy Major Change/Loss/Stressor/Fears (CP): Medical condition, self, Resources Quality of Family Relationships: supportive Do you feel physically threatened by others?: No      08/29/2023   11:11 AM  Depression screen PHQ 2/9  Decreased Interest 3  Down, Depressed, Hopeless 3  PHQ - 2 Score 6  Altered sleeping 3  Tired, decreased energy 3  Change in appetite 2  Feeling bad or failure about yourself  2  Trouble concentrating 2  Moving slowly or fidgety/restless 2  Suicidal thoughts 0  PHQ-9 Score 20  Difficult doing work/chores Somewhat difficult    There were no vitals  filed for this visit.  Medications Reviewed Today   Medications were not reviewed in this encounter     Recommendation:   PCP Follow-up Specialty provider follow-up as scheduled  Follow Up Plan:   Telephone follow up appointment date/time:  09/13/23  Lenn Mean,  LCSW [place]  [facility], [facility] Licensed Clinical Social Worker  Direct Dial: [phone]

## 2023-08-23 NOTE — Patient Instructions (Signed)
 Visit Information  Thank you for taking time to visit with me today. Please don't hesitate to contact me if I can be of assistance to you before our next scheduled appointment.  Our next appointment is by telephone on 09/13/23 at 1pm Please call the care guide team at [phone] if you need to cancel or reschedule your appointment.   Following is a copy of your care plan:   Goals Addressed             This Visit's Progress    VBCI Social Work Care Plan       Problems:   Lacks knowledge of how to connect to in-network mental health  providers  CSW Clinical Goal(s):   Over the next 90 days the Patient will explore community resource options for unmet needs related to Depression  .  Interventions:  Social Determinants of Health in Patient with Depression: depressed mood: SDOH assessments completed: Depression  , Food Insecurity , Housing , Intimate Partner Violence, and Transportation Evaluation of current treatment plan related to unmet needs Patient confirmed receiving resource to address food insecurity and transportation Confirmed being actively followed for medication management with [facility], patient requesting assistance with identifying a new therapist Discussed option of linkage to a therapist through [facility]-patient agreeable to inquiry CSW will look into additional in network options  Patient Goals/Self-Care Activities:  Coordinate with CSW to assist with linkage to in network mental health options.              Patient to contact [facility] to follow up on available therapist through that agency   Plan:   Telephone follow up appointment with care management team member scheduled for:  09/13/23         Please call the Suicide and Crisis Lifeline: 988 if you are experiencing a Mental Health or Behavioral Health Crisis or need someone to talk to.  Patient verbalizes understanding of instructions and care plan provided  today and agrees to view in MyChart. Active MyChart status and patient understanding of how to access instructions and care plan via MyChart confirmed with patient.     Zaccai Chavarin, LCSW [place]  [facility], [facility] Licensed Clinical Social Worker  Direct Dial: [phone]

## 2023-08-26 ENCOUNTER — Institutional Professional Consult (permissible substitution) (INDEPENDENT_AMBULATORY_CARE_PROVIDER_SITE_OTHER)

## 2023-08-26 NOTE — Telephone Encounter (Signed)
 Error

## 2023-08-28 ENCOUNTER — Other Ambulatory Visit: Payer: Self-pay | Admitting: Gastroenterology

## 2023-08-28 ENCOUNTER — Other Ambulatory Visit: Payer: Self-pay | Admitting: *Deleted

## 2023-08-28 ENCOUNTER — Encounter: Payer: Self-pay | Admitting: *Deleted

## 2023-08-28 DIAGNOSIS — R195 Other fecal abnormalities: Secondary | ICD-10-CM

## 2023-08-29 ENCOUNTER — Encounter: Payer: Self-pay | Admitting: Family Medicine

## 2023-08-29 ENCOUNTER — Other Ambulatory Visit: Payer: Self-pay

## 2023-08-29 ENCOUNTER — Ambulatory Visit: Admitting: Family Medicine

## 2023-08-29 VITALS — BP 134/70 | HR 87 | Temp 98.1°F | Ht 66.5 in | Wt 227.5 lb

## 2023-08-29 DIAGNOSIS — T380X5A Adverse effect of glucocorticoids and synthetic analogues, initial encounter: Secondary | ICD-10-CM

## 2023-08-29 DIAGNOSIS — M0600 Rheumatoid arthritis without rheumatoid factor, unspecified site: Secondary | ICD-10-CM

## 2023-08-29 DIAGNOSIS — I10 Essential (primary) hypertension: Secondary | ICD-10-CM | POA: Diagnosis not present

## 2023-08-29 DIAGNOSIS — Z86711 Personal history of pulmonary embolism: Secondary | ICD-10-CM

## 2023-08-29 MED ORDER — APIXABAN 2.5 MG PO TABS: 2.5000 mg | ORAL_TABLET | Freq: Two times a day (BID) | ORAL | 3 refills | Status: AC

## 2023-08-29 NOTE — Assessment & Plan Note (Signed)
 Continues 10 mg prednisone  daily for severe RA  Watching glucose/renal fxn  Weight gain noted

## 2023-08-29 NOTE — Progress Notes (Signed)
 Subjective:    Patient ID: Lori Orozco, female    DOB: 1951/02/24, 73 y.o.   MRN: [id number]  HPI  Wt Readings from Last 3 Encounters:  08/29/23 227 lb 8 oz (103.2 kg)  07/30/23 223 lb (101.2 kg)  07/29/23 218 lb (98.9 kg)   36.17 kg/m  Vitals:   08/29/23 1100  BP: 134/70  Pulse: 87  Temp: 98.1 F (36.7 C)  SpO2: 94%   Pt presents for follow up of  HTN CKD  History of pulm embolus (on eliquis  5 mg bid for 3 mo then plan to cut to 2.5)  RA   Breathing is ok  Breztri  works well  No new blood clots since last PE   Was going to follow up for AVMs  Cannot tolerate prep with colonoscopy or virtual Going to do CT with contrast instead    HTN bp is stable today  No cp or palpitations or headaches or edema  No side effects to medicines  BP Readings from Last 3 Encounters:  08/29/23 134/70  08/22/23 130/73  07/30/23 138/70    Carvedilol  3.125 mg bid  Pulse Readings from Last 3 Encounters:  08/29/23 87  08/22/23 85  07/17/23 90     Lab Results  Component Value Date   NA 140 07/02/2023   K 4.9 07/02/2023   CO2 30 07/02/2023   GLUCOSE 107 (H) 07/02/2023   BUN 36 (H) 07/02/2023   CREATININE 1.12 (H) 07/02/2023   CALCIUM 9.3 07/02/2023   GFR 53.52 (L) 06/06/2023   EGFR 52 (L) 07/02/2023   GFRNONAA 42 (L) 05/11/2023   GFR last of 52 -stable from April and better than march and December   RA Worse in right hand  Planning surgery  Will fuse joints and make new knuckle joints to start     Hypothyroid Lab Results  Component Value Date   TSH 5.04 07/17/2023   Levothyroxine  50 mcg daily   Lab Results  Component Value Date   CHOL 219 (H) 07/17/2023   HDL 63.70 07/17/2023   LDLCALC 128 (H) 07/17/2023   TRIG 137.0 07/17/2023   CHOLHDL 3 07/17/2023   Simvastatin  20 mg daily    Glucose Lab Results  Component Value Date   HGBA1C 5.6 05/05/2023   HGBA1C 5.2 07/13/2022   HGBA1C 5.4 07/11/2021   Has been on steroids frequently   Taking 10 mg prednisone  and cannot come off if it       Patient Active Problem List   Diagnosis Date Noted   Positive colorectal cancer screening using Cologuard test 08/12/2023   Colon cancer screening 07/17/2023   Diastolic dysfunction 06/06/2023   Class 2 severe obesity due to excess calories with serious comorbidity and body mass index (BMI) of 35.0 to 35.9 in adult (HCC) 06/06/2023   CKD stage 3b, GFR 30-44 ml/min (HCC) 05/05/2023   Elevated troponin 05/05/2023   Depression with anxiety 05/05/2023   Cerumen impaction 03/06/2023   Lesion of palate 03/06/2023   Mobility impaired 02/10/2023   Generalized weakness 02/08/2023   Recurrent falls 12/24/2022   Pre-operative clearance 10/30/2022   Diarrhea 10/19/2022   Cold intolerance 10/19/2022   Osteoporosis 09/10/2022   Thoracic compression fracture (HCC) 07/13/2022   Diabetes mellitus screening 07/13/2022   Lumbar herniated disc    Chronic pain 11/28/2021   Bilateral leg weakness 10/09/2021   Vitamin B12 deficiency 08/02/2021   Myofascial pain dysfunction syndrome 08/02/2021   Adverse effect of prednisone  07/11/2021   AVM (  arteriovenous malformation) 01/03/2021   History of pulmonary embolism 12/08/2020   Arthritis of knee 12/06/2020   Bilateral knee pain 09/20/2020   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   High risk medication use 09/20/2020   Extensor tenosynovitis of right wrist 09/15/2020   S/P reverse total shoulder arthroplasty, 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   Lung nodule 01/02/2020   Status post arthroscopy of left shoulder 01/16/2018   Estrogen deficiency 06/28/2017   Medial epicondylitis, left elbow 04/29/2017   S/P arthroscopy of left shoulder 02/25/2017   Impingement syndrome of left shoulder 01/30/2017   Trochanteric bursitis, right hip 04/10/2016   Hypertension 09/02/2015   Urge incontinence 07/27/2015   Obesity (BMI 30-39.9) 04/28/2015   Osteoarthritis of left knee 01/28/2015   Status post total left  knee replacement 01/28/2015   Vitamin D  deficiency 04/27/2014   Seasonal and perennial allergic rhinitis 06/17/2013   S/P total hip arthroplasty 03/20/2013   Medicare annual wellness visit, subsequent 11/18/2012   At high risk for falls 11/18/2012   Routine general medical examination at a health care facility 11/10/2012   Lichen sclerosus et atrophicus of the vulva 07/29/2012   Osteopenia 10/19/2011   Hypothyroidism 10/19/2011   Hereditary and idiopathic peripheral neuropathy 08/05/2009   Low back pain 08/05/2009   HAND PAIN, BILATERAL 08/05/2009   TREMOR 03/09/2008   MENOPAUSAL SYNDROME 10/09/2007   DEPRESSION, MAJOR 05/20/2007   BIPOLAR AFFECTIVE DISORDER 05/20/2007   Generalized anxiety disorder 05/20/2007   PERSONALITY DISORDER 05/20/2007   ESOPHAGEAL SPASM 05/20/2007   Diaphragmatic hernia 05/20/2007   COPD mixed type (HCC) 03/27/2007   HYPERCHOLESTEROLEMIA, PURE 12/10/2006   SYMPTOM, SYNDROME, CHRONIC FATIGUE 12/10/2006   Past Medical History:  Diagnosis Date   Allergy Hayfever   1958   Amaurosis fugax    Anemia    hx   Anxiety    Arthritis    Asthma    Blood transfusion without reported diagnosis    Cataract    Chronic fatigue syndrome    Chronic kidney disease    frequency, nephritis when 73 yrs old   COPD (chronic obstructive pulmonary disease) (HCC)    COVID-19    Depression    Diverticulitis    Emphysema of lung (HCC)    Fibromyalgia    GERD (gastroesophageal reflux disease)    occ   H/O hiatal hernia    History of bronchitis    Hyperlipidemia    Hypothyroidism    Interstitial cystitis    Irritable bowel syndrome    Lichen sclerosus    Lumbar herniated disc    Migraines    Motor nerve conduction block 11/2021   Osteoporosis 09/10/2022   Pneumonia    PONV (postoperative nausea and vomiting)    Shortness of breath    exertion    Thyroid  disease    Graves   Urinary frequency    Urinary tract infection    Vertigo    Past Surgical History:   Procedure Laterality Date   ABDOMINAL HYSTERECTOMY     bladder sugery      CAROTID DOPPLAR     COLONOSCOPY  05/26/2010   avms- otherwise nl , re check 10y   DEXA-OSTEOPENIA     DOPPLER ECHOCARDIOGRAPHY     elbow surgery     EPICONDYLITIS     EYE SURGERY Bilateral    cataracts   FOOT SURGERY Bilateral    I & D EXTREMITY Right 09/01/2020   Procedure: TYNOSYNOVECTOMY;  Surgeon: Vernetta Lonni GRADE, MD;  Location: [facility] OR;  Service:  Orthopedics;  Laterality: Right;   IMPLANTATION VAGAL NERVE STIMULATOR  03/2022   JOINT REPLACEMENT     KNEE SURGERY     Left cartilage   lipoma in second finger right hand     MOUTH SURGERY  08/22/2022   PLANTAR FASCIA SURGERY Left    REVERSE SHOULDER ARTHROPLASTY Left 02/02/2020   Procedure: LEFT REVERSE SHOULDER ARTHROPLASTY;  Surgeon: Addie Cordella Hamilton, MD;  Location: [facility] OR;  Service: Orthopedics;  Laterality: Left;   ROTATOR CUFF REPAIR Left 12/2017   TEMPOROMANDIBULAR JOINT SURGERY     TONSILLECTOMY     TOTAL HIP ARTHROPLASTY Right 03/20/2013   Procedure: Right TOTAL HIP ARTHROPLASTY;  Surgeon: Jerona LULLA Sage, MD;  Location: [facility] OR;  Service: Orthopedics;  Laterality: Right;  Right Total Hip Arthroplasty   TOTAL KNEE ARTHROPLASTY Left 01/28/2015   Procedure: LEFT TOTAL KNEE ARTHROPLASTY;  Surgeon: Lonni CINDERELLA Poli, MD;  Location: [facility] OR[facility];  Service: Orthopedics;  Laterality: Left;   TRIGGER FINGER RELEASE Right    TROCHANTERIC BURSA EXCISION Right 05/03/2016   Dr. Poli, Lonni   WRIST ARTHROSCOPY Right    ligament tear   Social History   Tobacco Use   Smoking status: Former    Current packs/day: 0.00    Average packs/day: 2.0 packs/day for 43.0 years (86.0 ttl pk-yrs)    Types: Cigarettes    Start date: 02/26/1970    Quit date: 02/26/2013    Years since quitting: 10.5    Passive exposure: Never   Smokeless tobacco: Never   Tobacco comments:    Vaping occasionally  Vaping Use   Vaping status: Former   Devices: haven't  used in 8 months  Substance Use Topics   Alcohol use: No   Drug use: No   Family History  Problem Relation Age of Onset   Arthritis Mother    Hypertension Mother    Kidney disease Mother    Heart disease Mother    Stroke Mother    Cancer Father        Bladder   Alcohol abuse Father    Arthritis Sister    Heart disease Sister    Colon cancer Other    Cancer - Lung Cousin    Arthritis Maternal Grandmother    Hearing loss Maternal Grandfather    Varicose Veins Maternal Uncle    Allergies  Allergen Reactions   Lithium Anaphylaxis   Tegretol [Carbamazepine] Other (See Comments)    Fever and body aches (fever over 103)   Tricyclic Antidepressants Anaphylaxis    Other reaction(s): Unknown   Methotrexate  Derivatives Other (See Comments)    Urinary retention and dizziness  Other reaction(s): Other UTI   Strawberry Extract Hives   Tapentadol Rash    PT ALLERGIC NYLON TAPE    Codeine Nausea Only    Makes pt stay awake   Cymbalta [Duloxetine Hcl] Other (See Comments)    Makes pt pass out    Erythromycin     abdominal pain   Lyrica  [Pregabalin ]     Felt faint   Neurontin [Gabapentin]     Passes  out   Rabeprazole Sodium     insomnia   Baclofen  Nausea Only    Sever nausea   Duraprep [Antiseptic Products, Misc.] Itching and Rash    Tolerates Betadine     Penicillins Rash    Tolerated ANCEF  02/02/20  Has patient had a PCN reaction causing immediate rash, facial/tongue/throat swelling, SOB or lightheadedness with hypotension: Yes Has patient had a PCN reaction  causing severe rash involving mucus membranes or skin necrosis: No Has patient had a PCN reaction that required hospitalization No Has patient had a PCN reaction occurring within the last 10 years: No If all of the above answers are NO, then may proceed with Cephalosporin use.    Tape Rash    PT ALLERGIC NYLON TAPE    Current Outpatient Medications on File Prior to Visit  Medication Sig Dispense Refill    acetaminophen  (TYLENOL ) 500 MG tablet Take 500 mg by mouth every 6 (six) hours as needed.     alendronate  (FOSAMAX ) 70 MG tablet TAKE 1 TABLET BY MOUTH ONCE A WEEK. TAKE WITH A FULL GLASS OF WATER  ON AN EMPTY STOMACH. 12 tablet 0   apixaban  (ELIQUIS ) 5 MG TABS tablet Take 1 tablet (5 mg total) by mouth 2 (two) times daily for 4 days. 180 tablet 1   Black Cohosh  40 MG CAPS Take 1 capsule (40 mg total) by mouth daily. 30 capsule 11   budesonide -glycopyrrolate -formoterol  (BREZTRI  AEROSPHERE) 160-9-4.8 MCG/ACT AERO inhaler Inhale 2 puffs into the lungs 2 (two) times daily. 3 each 4   buPROPion  (WELLBUTRIN  XL) 150 MG 24 hr tablet Take 150 mg by mouth daily.     busPIRone  (BUSPAR ) 15 MG tablet Take 15 mg by mouth 2 (two) times daily before a meal.     carvedilol  (COREG ) 3.125 MG tablet Take 1 tablet (3.125 mg total) by mouth 2 (two) times daily with a meal. 90 tablet 3   cholecalciferol  (VITAMIN D ) 25 MCG (1000 UNIT) tablet Take 2,000 Units by mouth daily. 2 tablets daily     clobetasol  cream (TEMOVATE ) 0.05 % APPLY SMALL AMOUNT TO AFFECTED AREAS TWO TO THREE TIMES PER WEEK 30 g 0   cyanocobalamin  2000 MCG tablet Take 500 mcg by mouth daily.     diclofenac  Sodium (VOLTAREN ) 1 % GEL Apply 2 g topically 2 (two) times daily as needed (pain).     diphenhydrAMINE  (BENADRYL ) 25 MG tablet Take 25 mg by mouth every 6 (six) hours as needed for allergies.     divalproex  (DEPAKOTE  ER) 500 MG 24 hr tablet Take 500 mg by mouth at bedtime.      docusate sodium  (COLACE) 100 MG capsule Take 1 capsule (100 mg total) by mouth 2 (two) times daily as needed for mild constipation.     famotidine  (PEPCID ) 20 MG tablet Take 1 tablet (20 mg total) by mouth daily. 90 tablet 1   fluticasone  (FLONASE ) 50 MCG/ACT nasal spray PLACE 2 SPRAYS INTO THE NOSE DAILY. 16 g 11   HYDROcodone -acetaminophen  (NORCO/VICODIN) 5-325 MG tablet Take 1 tablet by mouth daily as needed for severe pain (pain score 7-10). 30 tablet 0   hydrocortisone  cream 1 % Apply 1 Application topically 2 (two) times daily as needed for itching.     ipratropium-albuterol  (DUONEB) 0.5-2.5 (3) MG/3ML SOLN Take 3 mLs by nebulization 2 (two) times daily.     ketotifen  (ZADITOR ) 0.025 % ophthalmic solution Place 1 drop into both eyes 2 (two) times daily as needed (allergies).     levothyroxine  (SYNTHROID ) 50 MCG tablet Take 1 tablet (50 mcg total) by mouth daily before breakfast. 90 tablet 3   LORazepam  (ATIVAN ) 1 MG tablet Take 1 tablet (1 mg total) by mouth at bedtime as needed for anxiety or sleep. 7 tablet 0   mirabegron  ER (MYRBETRIQ ) 25 MG TB24 tablet TAKE 1 TABLET BY MOUTH TWICE A DAY 180 tablet 1   mirtazapine  (REMERON ) 30 MG  tablet Take 30 mg by mouth at bedtime.      ondansetron  (ZOFRAN -ODT) 4 MG disintegrating tablet Take 1 tablet (4 mg total) by mouth every 8 (eight) hours as needed for nausea or vomiting. Caution of sedation 20 tablet 0   predniSONE  (DELTASONE ) 10 MG tablet Take 1 tablet (10 mg total) by mouth daily with breakfast. 90 tablet 0   Rhubarb (ESTROVEN COMPLETE) 4 MG TABS Take 4 mg by mouth daily. 30 tablet 11   sertraline  (ZOLOFT ) 100 MG tablet Take 100 mg by mouth daily.      simvastatin  (ZOCOR ) 20 MG tablet TAKE 1 TABLET BY MOUTH AT BEDTIME 90 tablet 0   Spacer/Aero-Holding Chambers (AEROCHAMBER PLUS) inhaler Use with inhaler 1 each 2   thiamine  (VITAMIN B-1) 100 MG tablet Take 1 tablet (100 mg total) by mouth daily.     Tocilizumab (ACTEMRA ACTPEN) 162 MG/0.9ML SOAJ Inject 162 mg into the skin every 7 (seven) days. 3.6 mL 2   traMADol  (ULTRAM ) 50 MG tablet Take 1 tablet (50 mg total) by mouth every 6 (six) hours as needed for moderate pain (pain score 4-6). 15 tablet 0   No current facility-administered medications on file prior to visit.    Review of Systems  Constitutional:  Positive for fatigue. Negative for activity change, appetite change, fever and unexpected weight change.  HENT:  Negative for congestion, ear pain,  rhinorrhea, sinus pressure and sore throat.   Eyes:  Negative for pain, redness and visual disturbance.  Respiratory:  Negative for cough, shortness of breath and wheezing.   Cardiovascular:  Negative for chest pain and palpitations.  Gastrointestinal:  Negative for abdominal pain, blood in stool, constipation and diarrhea.  Endocrine: Negative for polydipsia and polyuria.  Genitourinary:  Negative for dysuria, frequency and urgency.  Musculoskeletal:  Positive for arthralgias and back pain. Negative for myalgias.  Skin:  Negative for pallor and rash.  Allergic/Immunologic: Negative for environmental allergies.  Neurological:  Negative for dizziness, syncope and headaches.  Hematological:  Negative for adenopathy. Does not bruise/bleed easily.  Psychiatric/Behavioral:  Negative for decreased concentration and dysphoric mood. The patient is not nervous/anxious.        Objective:   Physical Exam Constitutional:      General: Lori Orozco is not in acute distress.    Appearance: Normal appearance. Lori Orozco is well-developed. Lori Orozco is obese. Lori Orozco is not ill-appearing or diaphoretic.  HENT:     Head: Normocephalic and atraumatic.  Eyes:     Conjunctiva/sclera: Conjunctivae normal.     Pupils: Pupils are equal, round, and reactive to light.  Neck:     Thyroid : No thyromegaly.     Vascular: No carotid bruit or JVD.  Cardiovascular:     Rate and Rhythm: Normal rate and regular rhythm.     Heart sounds: Normal heart sounds.     No gallop.  Pulmonary:     Effort: Pulmonary effort is normal. No respiratory distress.     Breath sounds: Normal breath sounds. No stridor. No wheezing, rhonchi or rales.     Comments: Diffusely distant bs  Abdominal:     General: There is no distension or abdominal bruit.     Palpations: Abdomen is soft.  Musculoskeletal:     Cervical back: Normal range of motion and neck supple.     Right lower leg: No edema.     Left lower leg: No edema.     Comments: Marked deformity  in right hand with poor grip   Lymphadenopathy:  Cervical: No cervical adenopathy.  Skin:    General: Skin is warm and dry.     Coloration: Skin is not pale.     Findings: No rash.  Neurological:     Mental Status: Lori Orozco is alert.     Coordination: Coordination normal.     Deep Tendon Reflexes: Reflexes are normal and symmetric. Reflexes normal.  Psychiatric:        Mood and Affect: Mood normal.           Assessment & Plan:   Problem List Items Addressed This Visit       Cardiovascular and Mediastinum   Hypertension   bp in fair control at this time  BP Readings from Last 1 Encounters:  08/29/23 134/70   No changes needed Most recent labs reviewed  Disc lifstyle change with low sodium diet and exercise  Continues carvedilol  3.125 mg bid   Stable pulse 87      Relevant Medications   apixaban  (ELIQUIS ) 2.5 MG TABS tablet     Musculoskeletal and Integument   Seronegative rheumatoid arthritis (HCC)   Severe  Erosive changes and deformity in right hand limiting movement   Planning surgery for this  Notes reviewed   On prednisone   Tocilizumab         Other   History of pulmonary embolism - Primary   Eliquis  5 mg bid for 3 months Doing well-will decrease dose to 2.5 mg bid at next fill   No clots or problems in interim       Adverse effect of prednisone    Continues 10 mg prednisone  daily for severe RA  Watching glucose/renal fxn  Weight gain noted

## 2023-08-29 NOTE — Assessment & Plan Note (Signed)
 Severe  Erosive changes and deformity in right hand limiting movement   Planning surgery for this  Notes reviewed   On prednisone   Tocilizumab

## 2023-08-29 NOTE — Assessment & Plan Note (Signed)
 Eliquis  5 mg bid for 3 months Doing well-will decrease dose to 2.5 mg bid at next fill   No clots or problems in interim

## 2023-08-29 NOTE — Assessment & Plan Note (Signed)
 bp in fair control at this time  BP Readings from Last 1 Encounters:  08/29/23 134/70   No changes needed Most recent labs reviewed  Disc lifstyle change with low sodium diet and exercise  Continues carvedilol  3.125 mg bid   Stable pulse 87

## 2023-08-29 NOTE — Patient Instructions (Addendum)
 When you fill your next eliquis  - we are going down to 2.5 mg twice daily   Finish what you have before you pick that up  Next time we will take 5 mg off your list   Good luck with your surgery  Hold eliquis  for as long as surgeon needs you to  Hold your alendronate  until you are healed from surgery as well    Let us  know if you need anything

## 2023-09-02 ENCOUNTER — Telehealth: Payer: Self-pay | Admitting: Internal Medicine

## 2023-09-02 NOTE — Telephone Encounter (Signed)
 Patient advised She should stop taking actemra  during the 2 weeks prior to surgery and can restart 2 weeks afterwards, or after her hand surgeon feels recovery is far enough along if there is any delay. Patient verbalized understanding.

## 2023-09-02 NOTE — Telephone Encounter (Signed)
 She should stop taking actemra  during the 2 weeks prior to surgery and can restart 2 weeks afterwards, or after her hand surgeon feels recovery is far enough along if there is any delay.

## 2023-09-02 NOTE — Telephone Encounter (Signed)
 Pt called wanting to speak with someone about her medication. Pt stated she needs to inform her Ortho doctor, Dr. Lucillie, about her medication.

## 2023-09-04 ENCOUNTER — Other Ambulatory Visit: Payer: Self-pay

## 2023-09-10 HISTORY — PX: HAND SURGERY: SHX662

## 2023-09-11 ENCOUNTER — Ambulatory Visit: Admitting: Family Medicine

## 2023-09-13 ENCOUNTER — Other Ambulatory Visit: Payer: Self-pay | Admitting: *Deleted

## 2023-09-13 NOTE — Patient Outreach (Signed)
 Complex Care Management   Visit Note  09/13/2023  Name:  Lori Orozco MRN: [id number] DOB: [birth year]  Situation: Referral received for Complex Care Management related to Mental/Behavioral Health diagnosis depression and anxiety I obtained verbal consent from Patient.  Visit completed with patient  on the phone  Background:   Past Medical History:  Diagnosis Date   Allergy Hayfever   1958   Amaurosis fugax    Anemia    hx   Anxiety    Arthritis    Asthma    Blood transfusion without reported diagnosis    Cataract    Chronic fatigue syndrome    Chronic kidney disease    frequency, nephritis when 73 yrs old   COPD (chronic obstructive pulmonary disease) (HCC)    COVID-19    Depression    Diverticulitis    Emphysema of lung (HCC)    Fibromyalgia    GERD (gastroesophageal reflux disease)    occ   H/O hiatal hernia    History of bronchitis    Hyperlipidemia    Hypothyroidism    Interstitial cystitis    Irritable bowel syndrome    Lichen sclerosus    Lumbar herniated disc    Migraines    Motor nerve conduction block 11/2021   Osteoporosis 09/10/2022   Pneumonia    PONV (postoperative nausea and vomiting)    Shortness of breath    exertion    Thyroid  disease    Graves   Urinary frequency    Urinary tract infection    Vertigo     Assessment: Patient Reported Symptoms:  Cognitive Cognitive Status: Alert and oriented to person, place, and time, Normal speech and language skills, Insightful and able to interpret abstract concepts      Neurological Neurological Review of Symptoms: Numbness [place] Neurological Symptoms/Conditions [RPT]: continued tingling-continued neuropathy all over body, mainly on feet Neurological Management Strategies: Medication therapy, Routine screening, Medical device  HEENT HEENT Symptoms Reported: No symptoms reported      Cardiovascular Cardiovascular Symptoms Reported: No symptoms reported    Respiratory Respiratory Symptoms  Reported: No symptoms reported    Endocrine Endocrine Symptoms Reported: No symptoms reported    Gastrointestinal Gastrointestinal Symptoms Reported: No symptoms reported      Genitourinary Genitourinary Symptoms Reported: Incontinence Genitourinary Management Strategies: Incontinence garment/pad  Integumentary Integumentary Symptoms Reported: Skin changes, Sweating, Itching, Rash Additional Integumentary Details: per patient , reaction from the pain medication following surgery    Musculoskeletal Musculoskelatal Symptoms Reviewed: Other (right hand surgery) Other Musculoskeletal Symptoms: s/p wrist and hand surgery 09/10/23 Musculoskeletal Management Strategies: Routine screening, Medication therapy Musculoskeletal Comment: follow up with the sugeon on 09/23/23      Psychosocial Psychosocial Symptoms Reported: Anxiety - if selected complete GAD, Depression - if selected complete PHQ 2-9 Additional Psychological Details: PHQ 2-9 and GAD previously completed -currently being followed by [facility]-currenlty working on referral to a therapist  within the same practice Behavioral Management Strategies: Adequate rest, Coping strategies, Medication therapy Major Change/Loss/Stressor/Fears (CP): Medical condition, self, Resources Techniques to [place] with Loss/Stress/Change: Counseling, Medication Quality of Family Relationships: supportive Do you feel physically threatened by others?: No      08/29/2023   11:11 AM  Depression screen PHQ 2/9  Decreased Interest 3  Down, Depressed, Hopeless 3  PHQ - 2 Score 6  Altered sleeping 3  Tired, decreased energy 3  Change in appetite 2  Feeling bad or failure about yourself  2  Trouble concentrating 2  Moving slowly  or fidgety/restless 2  Suicidal thoughts 0  PHQ-9 Score 20  Difficult doing work/chores Somewhat difficult    There were no vitals filed for this visit.  Medications Reviewed Today   Medications were not reviewed in this  encounter     Recommendation:   PCP Follow-up Specialty provider follow-up as scheduled Follow up with [facility] mental health for ongoing mental health support  Follow Up Plan:   Telephone follow-up 10/07/23  Lenn Mean, LCSW [place]  [facility], [facility] Licensed Clinical Social Worker  Direct Dial: [phone]

## 2023-09-13 NOTE — Patient Instructions (Signed)
 Visit Information  Thank you for taking time to visit with me today. Please don't hesitate to contact me if I can be of assistance to you before our next scheduled appointment.  Your next care management appointment is by telephone on 10/07/23 at 2pm    Please call the care guide team at [phone] if you need to cancel, schedule, or reschedule an appointment.   Please call the Suicide and Crisis Lifeline: 988 if you are experiencing a Mental Health or Behavioral Health Crisis or need someone to talk to.  Maryama Kuriakose, LCSW [place]  [facility], [facility] Licensed Clinical Social Worker  Direct Dial: [phone]

## 2023-09-16 ENCOUNTER — Other Ambulatory Visit: Payer: Self-pay

## 2023-09-16 NOTE — Patient Outreach (Signed)
 Complex Care Management   Visit Note  09/16/2023  Name:  Lori Orozco MRN: [id number] DOB: 09/04/1950  Situation: Referral received for Complex Care Management related to RA/Chronic Pain/Falls I obtained verbal consent from Patient.  Visit completed with patient  on the phone  Background:   Past Medical History:  Diagnosis Date   Allergy Hayfever   1958   Amaurosis fugax    Anemia    hx   Anxiety    Arthritis    Asthma    Blood transfusion without reported diagnosis    Cataract    Chronic fatigue syndrome    Chronic kidney disease    frequency, nephritis when 73 yrs old   COPD (chronic obstructive pulmonary disease) (HCC)    COVID-19    Depression    Diverticulitis    Emphysema of lung (HCC)    Fibromyalgia    GERD (gastroesophageal reflux disease)    occ   H/O hiatal hernia    History of bronchitis    Hyperlipidemia    Hypothyroidism    Interstitial cystitis    Irritable bowel syndrome    Lichen sclerosus    Lumbar herniated disc    Migraines    Motor nerve conduction block 11/2021   Osteoporosis 09/10/2022   Pneumonia    PONV (postoperative nausea and vomiting)    Shortness of breath    exertion    Thyroid  disease    Graves   Urinary frequency    Urinary tract infection    Vertigo     Assessment: Patient Reported Symptoms:  Cognitive Cognitive Status: Alert and oriented to person, place, and time, Insightful and able to interpret abstract concepts, Normal speech and language skills Cognitive/Intellectual Conditions Management [RPT]: None reported or documented in medical history or problem list   Health Maintenance Behaviors: Annual physical exam, Healthy diet Healing Pattern: Unsure Health Facilitated by: Healthy diet, Pain control, Rest  Neurological Neurological Review of Symptoms: Numbness, Weakness Oher Neurological Symptoms/Conditions [RPT]: neuropathy fibromyalgia Neurological Management Strategies: Medication therapy, Routine screening   HEENT HEENT Symptoms Reported: No symptoms reported      Cardiovascular Cardiovascular Symptoms Reported: Swelling in legs or feet Does patient have uncontrolled Hypertension?: No Cardiovascular Management Strategies: Routine screening, Medication therapy Cardiovascular Comment: states swelling from RA not cardiac  Respiratory Respiratory Symptoms Reported: Shortness of breath Other Respiratory Symptoms: SOB with exertion relieved with rest Respiratory Management Strategies: Adequate rest, Routine screening, Medication therapy  Endocrine Endocrine Symptoms Reported: No symptoms reported Is patient diabetic?: No    Gastrointestinal Gastrointestinal Symptoms Reported: Constipation Additional Gastrointestinal Details: slight constipation, managed with colace      Genitourinary Genitourinary Symptoms Reported: Incontinence Genitourinary Management Strategies: Incontinence garment/pad  Integumentary Integumentary Symptoms Reported: Skin changes Additional Integumentary Details: no residual symptoms for oxycodone  reaction - skin dry Skin Comment: lotion prn  Musculoskeletal Musculoskelatal Symptoms Reviewed: Joint pain, Muscle pain, Back pain, Difficulty walking, Unsteady gait, Weakness Other Musculoskeletal Symptoms: fibromyalgia pain - hand surgery pain 4-5/10 Musculoskeletal Management Strategies: Routine screening, Medication therapy Falls in the past year?: Yes (fell going into house after surgery) Number of falls in past year: 2 or more Was there an injury with Fall?: No Fall Risk Category Calculator: 2 Patient Fall Risk Level: Moderate Fall Risk Patient at Risk for Falls Due to: History of fall(s), Impaired balance/gait, Impaired mobility Fall risk Follow up: Falls evaluation completed, Falls prevention discussed  Psychosocial Psychosocial Symptoms Reported: Anxiety - if selected complete GAD, Depression - if selected complete PHQ  2-9, Sadness - if selected complete PHQ  2-9 Additional Psychological Details: Counseling with LCSW - psychiatrist retired, will see new doctor within practice, sees psychologist and looking for a new one because not connecting Behavioral Management Strategies: Adequate rest, Coping strategies, Medication therapy Major Change/Loss/Stressor/Fears (CP): Medical condition, self Techniques to Cope with Loss/Stress/Change: Counseling, Medication Quality of Family Relationships: helpful, supportive, involved Do you feel physically threatened by others?: No      09/16/2023    2:38 PM  Depression screen PHQ 2/9  Decreased Interest 3  Down, Depressed, Hopeless 3  PHQ - 2 Score 6  Altered sleeping 3  Tired, decreased energy 3  Change in appetite 3  Feeling bad or failure about yourself  3  Trouble concentrating 3  Moving slowly or fidgety/restless 2  Suicidal thoughts 0  PHQ-9 Score 23  Difficult doing work/chores Extremely dIfficult    There were no vitals filed for this visit.  Medications Reviewed Today     Reviewed by Devra Lands, RN (Registered Nurse) on 09/16/23 at 1420  Med List Status: <None>   Medication Order Taking? Sig Documenting Provider Last Dose Status Informant  acetaminophen  (TYLENOL ) 500 MG tablet [id number] Yes Take 500 mg by mouth every 6 (six) hours as needed. [provider]  Active Family Member, Pharmacy Records           Med Note LEOBARDO, NICOLE   Sun May 05, 2023 12:44 AM) Last dose unknown  alendronate  (FOSAMAX ) 70 MG tablet [id number] Yes TAKE 1 TABLET BY MOUTH ONCE A WEEK. TAKE WITH A FULL GLASS OF WATER  ON AN EMPTY STOMACH. Jeannetta Lonni ORN, MD  Active   apixaban  (ELIQUIS ) 2.5 MG TABS tablet [id number] Yes Take 1 tablet (2.5 mg total) by mouth 2 (two) times daily. Tower, Laine LABOR, MD  Active   apixaban  (ELIQUIS ) 5 MG TABS tablet [id number]  Take 1 tablet (5 mg total) by mouth 2 (two) times daily for 4 days. Tower, Laine LABOR, MD  Expired 08/29/23 2359            Med Note (GREEN, DAVINA E    Mon Jul 29, 2023  3:33 PM) Patient states she takes 1 tablet 2 x per day.  Black Cohosh  40 MG CAPS [id number] Yes Take 1 capsule (40 mg total) by mouth daily. Constant, Peggy, MD  Active Family Member, Pharmacy Records  budesonide -glycopyrrolate -formoterol  (BREZTRI  AEROSPHERE) 160-9-4.8 MCG/ACT AERO inhaler [id number] Yes Inhale 2 puffs into the lungs 2 (two) times daily. Tower, Laine LABOR, MD  Active   buPROPion  (WELLBUTRIN  XL) 150 MG 24 hr tablet [id number] Yes Take 150 mg by mouth daily. [provider]  Active Family Member, Pharmacy Records  busPIRone  (BUSPAR ) 15 MG tablet [id number] Yes Take 15 mg by mouth 2 (two) times daily before a meal. [provider]  Active Family Member, Pharmacy Records  carvedilol  (COREG ) 3.125 MG tablet [id number] Yes Take 1 tablet (3.125 mg total) by mouth 2 (two) times daily with a meal. Tower, Laine LABOR, MD  Active   cholecalciferol  (VITAMIN D ) 25 MCG (1000 UNIT) tablet [id number] Yes Take 2,000 Units by mouth daily. 2 tablets daily [provider]  Active Family Member, Pharmacy Records  clobetasol  cream (TEMOVATE ) 0.05 % [id number] Yes APPLY SMALL AMOUNT TO AFFECTED AREAS TWO TO THREE TIMES PER WEEK Tower, Laine LABOR, MD  Active Family Member, Pharmacy Records           Med Note DORI ARVIN FORBES Pablo Jul 29, 2023  3:34  PM) Patient states she  uses as needed.   cyanocobalamin  2000 MCG tablet [id number] Yes Take 500 mcg by mouth daily. [provider]  Active Family Member, Pharmacy Records  diclofenac  Sodium (VOLTAREN ) 1 % GEL [id number] Yes Apply 2 g topically 2 (two) times daily as needed (pain). Dickie Begun, MD  Active Family Member, Pharmacy Records  diphenhydrAMINE  (BENADRYL ) 25 MG tablet [id number] Yes Take 25 mg by mouth every 6 (six) hours as needed for allergies. [provider]  Active Family Member, Pharmacy Records  divalproex  (DEPAKOTE  ER) 500 MG 24 hr tablet [id number] Yes Take 500 mg by mouth at bedtime.  [provider]  Active Family Member, Pharmacy Records  docusate sodium  (COLACE) 100 MG capsule [id number] Yes Take 1 capsule (100 mg total) by mouth 2 (two) times daily as needed for mild constipation. Caleen Burgess BROCKS, MD  Active   famotidine  (PEPCID ) 20 MG tablet [id number] Yes Take 1 tablet (20 mg total) by mouth daily. Tower, Laine LABOR, MD  Active   fluticasone  (FLONASE ) 50 MCG/ACT nasal spray [id number] Yes PLACE 2 SPRAYS INTO THE NOSE DAILY. Neysa Reggy BIRCH, MD  Active Family Member, Pharmacy Records  HYDROcodone -acetaminophen  (NORCO/VICODIN) 5-325 MG tablet [id number] Yes Take 1 tablet by mouth daily as needed for severe pain (pain score 7-10). Jeannetta Lonni ORN, MD  Active   hydrocortisone cream 1 % [id number] Yes Apply 1 Application topically 2 (two) times daily as needed for itching. [provider]  Active Family Member, Pharmacy Records           Med Note LEOBARDO, NICOLE   Sun May 05, 2023 12:24 AM) Last dose unknown  ipratropium-albuterol  (DUONEB) 0.5-2.5 (3) MG/3ML SOLN [id number] Yes Take 3 mLs by nebulization 2 (two) times daily.  Patient taking differently: Take 3 mLs by nebulization as needed.   Caleen Burgess BROCKS, MD  Active   ketotifen  (ZADITOR ) 0.025 % ophthalmic solution [id number] Yes Place 1 drop into both eyes 2 (two) times daily as needed (allergies). [provider]  Active Family Member, Pharmacy Records  levothyroxine  (SYNTHROID ) 50 MCG tablet [id number] Yes Take 1 tablet (50 mcg total) by mouth daily before breakfast. Tower, Laine LABOR, MD  Active   LORazepam  (ATIVAN ) 1 MG tablet [id number] Yes Take 1 tablet (1 mg total) by mouth at bedtime as needed for anxiety or sleep. Amin, Ankit C, MD  Active   mirabegron  ER (MYRBETRIQ ) 25 MG TB24 tablet [id number] Yes TAKE 1 TABLET BY MOUTH TWICE A DAY Tower, Marne A, MD  Active   mirtazapine  (REMERON ) 30 MG tablet [id number] Yes Take 30 mg by mouth at bedtime.  [provider]  Active Family Member, Pharmacy Records           Med Note  MAYER, ARLYSS RAMAN   Fri Apr 04, 2018  8:11 AM)    ondansetron  (ZOFRAN -ODT) 4 MG disintegrating tablet [id number] Yes Take 1 tablet (4 mg total) by mouth every 8 (eight) hours as needed for nausea or vomiting. Caution of sedation Tower, Laine LABOR, MD  Active   predniSONE  (DELTASONE ) 10 MG tablet [id number] Yes Take 1 tablet (10 mg total) by mouth daily with breakfast. Jeannetta Lonni ORN, MD  Active   Rhubarb (ESTROVEN COMPLETE) 4 MG TABS [id number] Yes Take 4 mg by mouth daily. Constant, Peggy, MD  Active Family Member, Pharmacy Records  sertraline  (ZOLOFT ) 100 MG tablet [id number] Yes Take 100 mg by mouth daily.  [provider]  Active Family Member, Pharmacy Records  Med Note MAYER ARLYSS RAMAN   Fri Apr 04, 2018  8:12 AM)    simvastatin  (ZOCOR ) 20 MG tablet [id number] Yes TAKE 1 TABLET BY MOUTH AT BEDTIME Tower, Laine LABOR, MD  Active Family Member, Pharmacy Records  Spacer/Aero-Holding Chambers (AEROCHAMBER PLUS) inhaler [id number] Yes Use with inhaler Van Knee, MD  Active Family Member, Pharmacy Records  thiamine  (VITAMIN B-1) 100 MG tablet [id number]  Take 1 tablet (100 mg total) by mouth daily.  Patient not taking: Reported on 09/16/2023   Caleen Burgess BROCKS, MD  Active   Tocilizumab  (ACTEMRA  ACTPEN) 162 MG/0.9ML SOAJ [id number] Yes Inject 162 mg into the skin every 7 (seven) days. Jeannetta Lonni ORN, MD  Active   traMADol  (ULTRAM ) 50 MG tablet [id number]  Take 1 tablet (50 mg total) by mouth every 6 (six) hours as needed for moderate pain (pain score 4-6).  Patient not taking: Reported on 09/16/2023   Caleen Burgess BROCKS, MD  Active             Recommendation:   PCP Follow-up Continue Current Plan of Care  Follow Up Plan:   Telephone follow-up in 1 month  Nestora Duos, MSN, RN [facility]  [facility] RN Care Manager Direct Dial: [phone] Fax: [phone]

## 2023-09-16 NOTE — Patient Instructions (Signed)
 Visit Information  Thank you for taking time to visit with me today. Please don't hesitate to contact me if I can be of assistance to you before our next scheduled appointment.  Your next care management appointment is by telephone on 10/14/23 at 2:00 pm  Telephone follow-up in 1 month  Please call the care guide team at [phone] if you need to cancel, schedule, or reschedule an appointment.   Please call the Suicide and Crisis Lifeline: 988 call the [place]  National Suicide Prevention Lifeline: (979)646-8762 or TTY: 916-882-2870 TTY 5085150500) to talk to a trained counselor call 1-800-273-TALK (toll free, 24 hour hotline) go to [facility] [address] [place] [phone]) call 911 if you are experiencing a Mental Health or Behavioral Health Crisis or need someone to talk to.  Nestora Duos, MSN, RN   [facility], [facility] RN Care Manager Direct Dial: [phone] Fax: [phone]   Fall Prevention in the Home, Adult Falls can cause injuries and can happen to people of all ages. There are many things you can do to make your home safer and to help prevent falls. What actions can I take to prevent falls? General information Use good lighting in all rooms. Make sure to: Replace any light bulbs that burn out. Turn on the lights in dark areas and use night-lights. Keep items that you use often in easy-to-reach places. Lower the shelves around your home if needed. Move furniture so that there are clear paths around it. Do not use throw rugs or other things on the floor that can make you trip. If any of your floors are uneven, fix them. Add color or contrast paint or tape to clearly mark and help you see: Grab bars or handrails. First and last steps of staircases. Where the edge of each step is. If you use a ladder or stepladder: Make sure that it is fully opened. Do not climb a closed  ladder. Make sure the sides of the ladder are locked in place. Have someone hold the ladder while you use it. Know where your pets are as you move through your home. What can I do in the bathroom?     Keep the floor dry. Clean up any water  on the floor right away. Remove soap buildup in the bathtub or shower. Buildup makes bathtubs and showers slippery. Use non-skid mats or decals on the floor of the bathtub or shower. Attach bath mats securely with double-sided, non-slip rug tape. If you need to sit down in the shower, use a non-slip stool. Install grab bars by the toilet and in the bathtub and shower. Do not use towel bars as grab bars. What can I do in the bedroom? Make sure that you have a light by your bed that is easy to reach. Do not use any sheets or blankets on your bed that hang to the floor. Have a firm chair or bench with side arms that you can use for support when you get dressed. What can I do in the kitchen? Clean up any spills right away. If you need to reach something above you, use a step stool with a grab bar. Keep electrical cords out of the way. Do not use floor polish or wax that makes floors slippery. What can I do with my stairs? Do not leave anything on the stairs. Make sure that you have a light switch at the top and the bottom of the stairs. Make sure that there are  handrails on both sides of the stairs. Fix handrails that are broken or loose. Install non-slip stair treads on all your stairs if they do not have carpet. Avoid having throw rugs at the top or bottom of the stairs. Choose a carpet that does not hide the edge of the steps on the stairs. Make sure that the carpet is firmly attached to the stairs. Fix carpet that is loose or worn. What can I do on the outside of my home? Use bright outdoor lighting. Fix the edges of walkways and driveways and fix any cracks. Clear paths of anything that can make you trip, such as tools or rocks. Add color or  contrast paint or tape to clearly mark and help you see anything that might make you trip as you walk through a door, such as a raised step or threshold. Trim any bushes or trees on paths to your home. Check to see if handrails are loose or broken and that both sides of all steps have handrails. Install guardrails along the edges of any raised decks and porches. Have leaves, snow, or ice cleared regularly. Use sand, salt, or ice melter on paths if you live where there is ice and snow during the winter. Clean up any spills in your garage right away. This includes grease or oil spills. What other actions can I take? Review your medicines with your doctor. Some medicines can cause dizziness or changes in blood pressure, which increase your risk of falling. Wear shoes that: Have a low heel. Do not wear high heels. Have rubber bottoms and are closed at the toe. Feel good on your feet and fit well. Use tools that help you move around if needed. These include: Canes. Walkers. Scooters. Crutches. Ask your doctor what else you can do to help prevent falls. This may include seeing a physical therapist to learn to do exercises to move better and get stronger. Where to find more information Centers for Disease Control and Prevention, STEADI: TonerPromos.no [employer] on Aging: BaseRingTones.pl National Institute on Aging: BaseRingTones.pl Contact a doctor if: You are afraid of falling at home. You feel weak, drowsy, or dizzy at home. You fall at home. Get help right away if you: Lose consciousness or have trouble moving after a fall. Have a fall that causes a head injury. These symptoms may be an emergency. Get help right away. Call 911. Do not wait to see if the symptoms will go away. Do not drive yourself to the hospital. This information is not intended to replace advice given to you by your health care provider. Make sure you discuss any questions you have with your health care provider. Document  Revised: 10/16/2021 Document Reviewed: 10/16/2021 Elsevier Patient Education  2024 Elsevier Inc.  Myofascial Pain Syndrome and Fibromyalgia Myofascial pain syndrome and fibromyalgia are both pain disorders. You may feel this pain mainly in your muscles. Myofascial pain syndrome: Always has tender points in the muscles that will cause pain when pressed (trigger points). The pain may come and go. Usually affects your neck, upper back, and shoulder areas. The pain often moves into your arms and hands. Fibromyalgia: Has muscle pains and tenderness that come and go. Is often associated with tiredness (fatigue) and sleep problems. Has trigger points. Tends to be long-lasting (chronic), but is not life-threatening. Fibromyalgia and myofascial pain syndrome are not the same. However, they often occur together. If you have both conditions, each can make the other worse. Both are common and can cause enough  pain and fatigue to make day-to-day activities difficult. Both can be hard to diagnose because their symptoms are common in many other conditions. What are the causes? The exact causes of these conditions are not known. What increases the risk? You are more likely to develop either of these conditions if: You have a family history of the condition. You are female. You have certain triggers, such as: Spine disorders. An injury (trauma) or other physical stressors. Being under a lot of stress. Medical conditions such as osteoarthritis, rheumatoid arthritis, or lupus. What are the signs or symptoms? Fibromyalgia The main symptom of fibromyalgia is widespread pain and tenderness in your muscles. Pain is sometimes described as stabbing, shooting, or burning. You may also have: Tingling or numbness. Sleep problems and fatigue. Problems with attention and concentration (fibro fog). Other symptoms may include: Bowel and bladder problems. Headaches. Vision problems. Sensitivity to odors and  noises. Depression or mood changes. Painful menstrual periods (dysmenorrhea). Dry skin or eyes. These symptoms can vary over time. Myofascial pain syndrome Symptoms of myofascial pain syndrome include: Tight, ropy bands of muscle. Uncomfortable sensations in muscle areas. These may include aching, cramping, burning, numbness, tingling, and weakness. Difficulty moving certain parts of the body freely (poor range of motion). How is this diagnosed? This condition may be diagnosed by your symptoms and medical history. You will also have a physical exam. In general: Fibromyalgia is diagnosed if you have pain, fatigue, and other symptoms for more than 3 months, and symptoms cannot be explained by another condition. Myofascial pain syndrome is diagnosed if you have trigger points in your muscles, and those trigger points are tender and cause pain elsewhere in your body (referred pain). How is this treated? Treatment for these conditions depends on the type that you have. For fibromyalgia, a healthy lifestyle is the most important treatment including aerobic and strength exercises. Different types of medicines are used to help treat pain and include: NSAIDs. Medicines for treating depression. Medicines that help control seizures. Medicines that relax the muscles. Treatment for myofascial pain syndrome includes: Pain medicines, such as NSAIDs. Cooling and stretching of muscles. Massage therapy with myofascial release technique. Trigger point injections. Treating these conditions often requires a team of health care providers. These may include: Your primary care provider. A physical therapist. Complementary health care providers, such as massage therapists or acupuncturists. A psychiatrist for cognitive behavioral therapy. Follow these instructions at home: Medicines Take over-the-counter and prescription medicines only as told by your health care provider. Ask your health care provider if  the medicine prescribed to you: Requires you to avoid driving or using machinery. Can cause constipation. You may need to take these actions to prevent or treat constipation: Drink enough fluid to keep your urine pale yellow. Take over-the-counter or prescription medicines. Eat foods that are high in fiber, such as beans, whole grains, and fresh fruits and vegetables. Limit foods that are high in fat and processed sugars, such as fried or sweet foods. Lifestyle  Do exercises as told by your health care provider or physical therapist. Practice relaxation techniques to control your stress. You may want to try: Biofeedback. Visual imagery. Hypnosis. Muscle relaxation. Yoga. Meditation. Maintain a healthy lifestyle. This includes eating a healthy diet and getting enough sleep. Do not use any products that contain nicotine or tobacco. These products include cigarettes, chewing tobacco, and vaping devices, such as e-cigarettes. If you need help quitting, ask your health care provider. General instructions Talk to your health care provider about  complementary treatments, such as acupuncture or massage. Do not do activities that stress or strain your muscles. This includes repetitive motions and heavy lifting. Keep all follow-up visits. This is important. Where to find support Consider joining a support group with others who are diagnosed with this condition. National Fibromyalgia Association: fmaware.org Where to find more information U.S. Pain Foundation: uspainfoundation.org Contact a health care provider if: You have new symptoms. Your symptoms get worse or your pain is severe. You have side effects from your medicines. You have trouble sleeping. Your condition is causing depression or anxiety. Get help right away if: You have thoughts of hurting yourself or others. Get help right away if you feel like you may hurt yourself or others, or have thoughts about taking your own life. Go to  your nearest emergency room or: Call 911. Call the National Suicide Prevention Lifeline at (206)673-4888 or 988. This is open 24 hours a day. Text the Crisis Text Line at 734-226-4062. This information is not intended to replace advice given to you by your health care provider. Make sure you discuss any questions you have with your health care provider. Document Revised: 11/20/2021 Document Reviewed: 01/13/2021 Elsevier Patient Education  2024 Elsevier Inc.DASH Eating Plan DASH stands for Dietary Approaches to Stop Hypertension. The DASH eating plan is a healthy eating plan that has been shown to: Lower high blood pressure (hypertension). Reduce your risk for type 2 diabetes, heart disease, and stroke. Help with weight loss. What are tips for following this plan? Reading food labels Check food labels for the amount of salt (sodium) per serving. Choose foods with less than 5 percent of the Daily Value (DV) of sodium. In general, foods with less than 300 milligrams (mg) of sodium per serving fit into this eating plan. To find whole grains, look for the word whole as the first word in the ingredient list. Shopping Buy products labeled as low-sodium or no salt added. Buy fresh foods. Avoid canned foods and pre-made or frozen meals. Cooking Try not to add salt when you cook. Use salt-free seasonings or herbs instead of table salt or sea salt. Check with your health care provider or pharmacist before using salt substitutes. Do not fry foods. Cook foods in healthy ways, such as baking, boiling, grilling, roasting, or broiling. Cook using oils that are good for your heart. These include olive, canola, avocado, soybean, and sunflower oil. Meal planning  Eat a balanced diet. This should include: 4 or more servings of fruits and 4 or more servings of vegetables each day. Try to fill half of your plate with fruits and vegetables. 6-8 servings of whole grains each day. 6 or less servings of lean  meat, poultry, or fish each day. 1 oz is 1 serving. A 3 oz (85 g) serving of meat is about the same size as the palm of your hand. One egg is 1 oz (28 g). 2-3 servings of low-fat dairy each day. One serving is 1 cup (237 mL). 1 serving of nuts, seeds, or beans 5 times each week. 2-3 servings of heart-healthy fats. Healthy fats called omega-3 fatty acids are found in foods such as walnuts, flaxseeds, fortified milks, and eggs. These fats are also found in cold-water  fish, such as sardines, salmon, and mackerel. Limit how much you eat of: Canned or prepackaged foods. Food that is high in trans fat, such as fried foods. Food that is high in saturated fat, such as fatty meat. Desserts and other sweets, sugary drinks, and  other foods with added sugar. Full-fat dairy products. Do not salt foods before eating. Do not eat more than 4 egg yolks a week. Try to eat at least 2 vegetarian meals a week. Eat more home-cooked food and less restaurant, buffet, and fast food. Lifestyle When eating at a restaurant, ask if your food can be made with less salt or no salt. If you drink alcohol: Limit how much you have to: 0-1 drink a day if you are female. 0-2 drinks a day if you are female. Know how much alcohol is in your drink. In the [place] one drink is one 12 oz bottle of beer (355 mL), one 5 oz glass of wine (148 mL), or one 1 oz glass of hard liquor (44 mL). General information Avoid eating more than 2,300 mg of salt a day. If you have hypertension, you may need to reduce your sodium intake to 1,500 mg a day. Work with your provider to stay at a healthy body weight or lose weight. Ask what the best weight range is for you. On most days of the week, get at least 30 minutes of exercise that causes your heart to beat faster. This may include walking, swimming, or biking. Work with your provider or dietitian to adjust your eating plan to meet your specific calorie needs. What foods should I eat? Fruits All  fresh, dried, or frozen fruit. Canned fruits that are in their natural juice and do not have sugar added to them. Vegetables Fresh or frozen vegetables that are raw, steamed, roasted, or grilled. Low-sodium or reduced-sodium tomato and vegetable juice. Low-sodium or reduced-sodium tomato sauce and tomato paste. Low-sodium or reduced-sodium canned vegetables. Grains Whole-grain or whole-wheat bread. Whole-grain or whole-wheat pasta. Brown rice. Mcneil Madeira. Bulgur. Whole-grain and low-sodium cereals. Pita bread. Low-fat, low-sodium crackers. Whole-wheat flour tortillas. Meats and other proteins Skinless chicken or [place]. Ground chicken or [place]. Pork with fat trimmed off. Fish and seafood. Egg whites. Dried beans, peas, or lentils. Unsalted nuts, nut butters, and seeds. Unsalted canned beans. Lean cuts of beef with fat trimmed off. Low-sodium, lean precooked or cured meat, such as sausages or meat loaves. Dairy Low-fat (1%) or fat-free (skim) milk. Reduced-fat, low-fat, or fat-free cheeses. Nonfat, low-sodium ricotta or cottage cheese. Low-fat or nonfat yogurt. Low-fat, low-sodium cheese. Fats and oils Soft margarine without trans fats. Vegetable oil. Reduced-fat, low-fat, or light mayonnaise and salad dressings (reduced-sodium). Canola, safflower, olive, avocado, soybean, and sunflower oils. Avocado. Seasonings and condiments Herbs. Spices. Seasoning mixes without salt. Other foods Unsalted popcorn and pretzels. Fat-free sweets. The items listed above may not be all the foods and drinks you can have. Talk to a dietitian to learn more. What foods should I avoid? Fruits Canned fruit in a light or heavy syrup. Fried fruit. Fruit in cream or butter sauce. Vegetables Creamed or fried vegetables. Vegetables in a cheese sauce. Regular canned vegetables that are not marked as low-sodium or reduced-sodium. Regular canned tomato sauce and paste that are not marked as low-sodium or reduced-sodium.  Regular tomato and vegetable juices that are not marked as low-sodium or reduced-sodium. Dene. Olives. Grains Baked goods made with fat, such as croissants, muffins, or some breads. Dry pasta or rice meal packs. Meats and other proteins Fatty cuts of meat. Ribs. Fried meat. Aldona. Bologna, salami, and other precooked or cured meats, such as sausages or meat loaves, that are not lean and low in sodium. Fat from the back of a pig (fatback). Bratwurst. Salted nuts and seeds.  Canned beans with added salt. Canned or smoked fish. Whole eggs or egg yolks. Chicken or [place] with skin. Dairy Whole or 2% milk, cream, and half-and-half. Whole or full-fat cream cheese. Whole-fat or sweetened yogurt. Full-fat cheese. Nondairy creamers. Whipped toppings. Processed cheese and cheese spreads. Fats and oils Butter. Stick margarine. Lard. Shortening. Ghee. Bacon fat. Tropical oils, such as coconut, palm kernel, or palm oil. Seasonings and condiments Onion salt, garlic salt, seasoned salt, table salt, and sea salt. Worcestershire sauce. Tartar sauce. Barbecue sauce. Teriyaki sauce. Soy sauce, including reduced-sodium soy sauce. Steak sauce. Canned and packaged gravies. Fish sauce. Oyster sauce. Cocktail sauce. Store-bought horseradish. Ketchup. Mustard. Meat flavorings and tenderizers. Bouillon cubes. Hot sauces. Pre-made or packaged marinades. Pre-made or packaged taco seasonings. Relishes. Regular salad dressings. Other foods Salted popcorn and pretzels. The items listed above may not be all the foods and drinks you should avoid. Talk to a dietitian to learn more. Where to find more information National Heart, Lung, and Blood Institute (NHLBI): BuffaloDryCleaner.gl American Heart Association (AHA): heart.org Academy of Nutrition and Dietetics: eatright.org National Kidney Foundation (NKF): kidney.org This information is not intended to replace advice given to you by your health care provider. Make sure you discuss any  questions you have with your health care provider. Document Revised: 03/01/2022 Document Reviewed: 03/01/2022 Elsevier Patient Education  2024 [employer].

## 2023-09-27 ENCOUNTER — Other Ambulatory Visit (HOSPITAL_COMMUNITY): Payer: Self-pay | Admitting: Pharmacy Technician

## 2023-09-27 ENCOUNTER — Other Ambulatory Visit (HOSPITAL_COMMUNITY): Payer: Self-pay

## 2023-09-27 NOTE — Progress Notes (Signed)
 Specialty Pharmacy Refill Coordination Note  Lori Orozco is a 73 y.o. female contacted today regarding refills of specialty medication(s) Tocilizumab  (Actemra  ACTPen)   Patient requested Delivery   Delivery date: 10/09/23   Verified address: [address]   [place] [address]   Medication will be filled on 10/08/23.

## 2023-09-30 ENCOUNTER — Ambulatory Visit
Admission: RE | Admit: 2023-09-30 | Discharge: 2023-09-30 | Disposition: A | Discharge status: Home / Self Care | Source: Ambulatory Visit | Attending: Gastroenterology | Admitting: Gastroenterology

## 2023-09-30 DIAGNOSIS — R195 Other fecal abnormalities: Secondary | ICD-10-CM

## 2023-09-30 MED ORDER — IOPAMIDOL (ISOVUE-300) INJECTION 61%
100.0000 mL | Freq: Once | INTRAVENOUS | Status: AC | PRN
  Administered 2023-09-30: 100 mL via INTRAVENOUS

## 2023-10-04 ENCOUNTER — Other Ambulatory Visit: Payer: Self-pay | Admitting: Family Medicine

## 2023-10-07 ENCOUNTER — Other Ambulatory Visit: Payer: Self-pay | Admitting: *Deleted

## 2023-10-07 NOTE — Patient Outreach (Signed)
 Complex Care Management   Visit Note  10/07/2023  Name:  Lori Orozco MRN: [id number] DOB: [birth year]  Situation: Referral received for Complex Care Management related to Mental/Behavioral Health diagnosis depression and anxiety I obtained verbal consent from Patient. Visit completed with patient on the phone   Background:   Past Medical History:  Diagnosis Date   Allergy Hayfever   1958   Amaurosis fugax    Anemia    hx   Anxiety    Arthritis    Asthma    Blood transfusion without reported diagnosis    Cataract    Chronic fatigue syndrome    Chronic kidney disease    frequency, nephritis when 73 yrs old   COPD (chronic obstructive pulmonary disease) (HCC)    COVID-19    Depression    Diverticulitis    Emphysema of lung (HCC)    Fibromyalgia    GERD (gastroesophageal reflux disease)    occ   H/O hiatal hernia    History of bronchitis    Hyperlipidemia    Hypothyroidism    Interstitial cystitis    Irritable bowel syndrome    Lichen sclerosus    Lumbar herniated disc    Migraines    Motor nerve conduction block 11/2021   Osteoporosis 09/10/2022   Pneumonia    PONV (postoperative nausea and vomiting)    Shortness of breath    exertion    Thyroid  disease    Graves   Urinary frequency    Urinary tract infection    Vertigo     Assessment: Patient Reported Symptoms:  Cognitive Cognitive Status: Alert and oriented to person, place, and time, Insightful and able to interpret abstract concepts, Normal speech and language skills Cognitive/Intellectual Conditions Management [RPT]: None reported or documented in medical history or problem list   Health Maintenance Behaviors: Annual physical exam, Healthy diet  Neurological Neurological Review of Symptoms: Weakness Oher Neurological Symptoms/Conditions [RPT]: long standing weakness since 1988 due to fibormyaliga Neurological Management Strategies: Medication therapy, Routine screening  HEENT HEENT Symptoms  Reported: No symptoms reported      Cardiovascular Cardiovascular Symptoms Reported: Swelling in legs or feet Cardiovascular Comment: due to neurapatyy and RA  Respiratory      Endocrine Endocrine Symptoms Reported: No symptoms reported    Gastrointestinal Gastrointestinal Symptoms Reported: No symptoms reported Additional Gastrointestinal Details: constipation has resolved, no longer taking colace Gastrointestinal Management Strategies: Diet modification, Medication therapy    Genitourinary Genitourinary Symptoms Reported: Incontinence Genitourinary Management Strategies: Incontinence garment/pad  Integumentary Integumentary Symptoms Reported: No symptoms reported    Musculoskeletal Musculoskelatal Symptoms Reviewed: Joint pain, Back pain, Difficulty walking, Unsteady gait Other Musculoskeletal Symptoms: fubromyalgia pain-had surgery PT starting Friday surgeon follow up on 8/25 Musculoskeletal Management Strategies: Routine screening, Medication therapy      Psychosocial Psychosocial Symptoms Reported: Anxiety - if selected complete GAD, Depression - if selected complete PHQ 2-9 Additional Psychological Details: Patient continues to confirm plan to remain with [facility] for medication management-next appt is in September where she will be referred to a therapist at that time Behavioral Management Strategies: Adequate rest, Medication therapy Major Change/Loss/Stressor/Fears (CP): Medical condition, self Techniques to Cope with Loss/Stress/Change: Counseling, Medication Quality of Family Relationships: helpful, supportive, involved Do you feel physically threatened by others?: No      09/16/2023    2:38 PM  Depression screen PHQ 2/9  Decreased Interest 3  Down, Depressed, Hopeless 3  PHQ - 2 Score 6  Altered sleeping 3  Tired,  decreased energy 3  Change in appetite 3  Feeling bad or failure about yourself  3  Trouble concentrating 3  Moving slowly or fidgety/restless  2  Suicidal thoughts 0  PHQ-9 Score 23  Difficult doing work/chores Extremely dIfficult    There were no vitals filed for this visit.  Medications Reviewed Today     Reviewed by Ermalinda Lenn HERO, LCSW (Social Worker) on 10/07/23 at 1912  Med List Status: <None>   Medication Order Taking? Sig Documenting Provider Last Dose Status Informant  acetaminophen  (TYLENOL ) 500 MG tablet [id number]  Take 500 mg by mouth every 6 (six) hours as needed. [provider]  Active Family Member, Pharmacy Records           Med Note LEOBARDO, NICOLE   Sun May 05, 2023 12:44 AM) Last dose unknown  alendronate  (FOSAMAX ) 70 MG tablet [id number]  TAKE 1 TABLET BY MOUTH ONCE A WEEK. TAKE WITH A FULL GLASS OF WATER  ON AN EMPTY STOMACH. Jeannetta Lonni ORN, MD  Active   apixaban  (ELIQUIS ) 2.5 MG TABS tablet [id number]  Take 1 tablet (2.5 mg total) by mouth 2 (two) times daily. Tower, Laine LABOR, MD  Active   apixaban  (ELIQUIS ) 5 MG TABS tablet [id number]  Take 1 tablet (5 mg total) by mouth 2 (two) times daily for 4 days. Tower, Laine LABOR, MD  Expired 08/29/23 2359            Med Note (GREEN, DAVINA E   Mon Jul 29, 2023  3:33 PM) Patient states she takes 1 tablet 2 x per day.  Black Cohosh  40 MG CAPS [id number]  Take 1 capsule (40 mg total) by mouth daily. Constant, Peggy, MD  Active Family Member, Pharmacy Records  budesonide -glycopyrrolate -formoterol  (BREZTRI  AEROSPHERE) 160-9-4.8 MCG/ACT AERO inhaler [id number]  Inhale 2 puffs into the lungs 2 (two) times daily. Tower, Laine LABOR, MD  Active   buPROPion  (WELLBUTRIN  XL) 150 MG 24 hr tablet [id number]  Take 150 mg by mouth daily. [provider]  Active Family Member, Pharmacy Records  busPIRone  (BUSPAR ) 15 MG tablet [id number]  Take 15 mg by mouth 2 (two) times daily before a meal. [provider]  Active Family Member, Pharmacy Records  carvedilol  (COREG ) 3.125 MG tablet [id number]  TAKE 1 TABLET BY MOUTH TWICE A DAY WITH A MEAL Tower, Marne A, MD  Active    cholecalciferol  (VITAMIN D ) 25 MCG (1000 UNIT) tablet [id number]  Take 2,000 Units by mouth daily. 2 tablets daily [provider]  Active Family Member, Pharmacy Records  clobetasol  cream (TEMOVATE ) 0.05 % [id number]  APPLY SMALL AMOUNT TO AFFECTED AREAS TWO TO THREE TIMES PER WEEK Tower, Laine LABOR, MD  Active Family Member, Pharmacy Records           Med Note DORI ARVIN FORBES Pablo Jul 29, 2023  3:34 PM) Patient states she  uses as needed.   cyanocobalamin  2000 MCG tablet [id number]  Take 500 mcg by mouth daily. [provider]  Active Family Member, Pharmacy Records  diclofenac  Sodium (VOLTAREN ) 1 % GEL [id number]  Apply 2 g topically 2 (two) times daily as needed (pain). Dickie Begun, MD  Active Family Member, Pharmacy Records  diphenhydrAMINE  (BENADRYL ) 25 MG tablet [id number]  Take 25 mg by mouth every 6 (six) hours as needed for allergies. [provider]  Active Family Member, Pharmacy Records  divalproex  (DEPAKOTE  ER) 500 MG 24 hr tablet [id number]  Take 500 mg by mouth at bedtime.  [provider]  Active Family Member, Pharmacy Records  docusate sodium  (COLACE) 100 MG capsule [id number]  Take 1 capsule (100 mg total) by mouth 2 (two) times daily as needed for mild constipation. Caleen Burgess BROCKS, MD  Active   famotidine  (PEPCID ) 20 MG tablet [id number]  Take 1 tablet (20 mg total) by mouth daily. Tower, Laine LABOR, MD  Active   fluticasone  (FLONASE ) 50 MCG/ACT nasal spray [id number]  PLACE 2 SPRAYS INTO THE NOSE DAILY. Neysa Reggy BIRCH, MD  Active Family Member, Pharmacy Records  HYDROcodone -acetaminophen  (NORCO/VICODIN) 5-325 MG tablet [id number]  Take 1 tablet by mouth daily as needed for severe pain (pain score 7-10). Jeannetta Lonni ORN, MD  Active   hydrocortisone cream 1 % [id number]  Apply 1 Application topically 2 (two) times daily as needed for itching. [provider]  Active Family Member, Pharmacy Records           Med Note (LEE, NICOLE   Sun May 05, 2023  12:24 AM) Last dose unknown  ipratropium-albuterol  (DUONEB) 0.5-2.5 (3) MG/3ML SOLN [id number]  Take 3 mLs by nebulization 2 (two) times daily.  Patient taking differently: Take 3 mLs by nebulization as needed.   Caleen Burgess BROCKS, MD  Active   ketotifen  (ZADITOR ) 0.025 % ophthalmic solution [id number]  Place 1 drop into both eyes 2 (two) times daily as needed (allergies). [provider]  Active Family Member, Pharmacy Records  levothyroxine  (SYNTHROID ) 50 MCG tablet [id number]  Take 1 tablet (50 mcg total) by mouth daily before breakfast. Tower, Laine LABOR, MD  Active   LORazepam  (ATIVAN ) 1 MG tablet [id number]  Take 1 tablet (1 mg total) by mouth at bedtime as needed for anxiety or sleep. Caleen Burgess BROCKS, MD  Active   mirabegron  ER (MYRBETRIQ ) 25 MG TB24 tablet [id number]  TAKE 1 TABLET BY MOUTH TWICE A DAY Tower, Marne A, MD  Active   mirtazapine  (REMERON ) 30 MG tablet [id number]  Take 30 mg by mouth at bedtime.  [provider]  Active Family Member, Pharmacy Records           Med Note MAYER, ARLYSS RAMAN   Fri Apr 04, 2018  8:11 AM)    ondansetron  (ZOFRAN -ODT) 4 MG disintegrating tablet [id number]  Take 1 tablet (4 mg total) by mouth every 8 (eight) hours as needed for nausea or vomiting. Caution of sedation Tower, Laine LABOR, MD  Active   predniSONE  (DELTASONE ) 10 MG tablet [id number]  Take 1 tablet (10 mg total) by mouth daily with breakfast. Jeannetta Lonni ORN, MD  Active   Rhubarb (ESTROVEN COMPLETE) 4 MG TABS [id number]  Take 4 mg by mouth daily. Constant, Peggy, MD  Active Family Member, Pharmacy Records  sertraline  (ZOLOFT ) 100 MG tablet [id number]  Take 100 mg by mouth daily.  [provider]  Active Family Member, Pharmacy Records           Med Note MAYER ARLYSS RAMAN   Fri Apr 04, 2018  8:12 AM)    simvastatin  (ZOCOR ) 20 MG tablet [id number]  TAKE 1 TABLET BY MOUTH AT BEDTIME Tower, Laine LABOR, MD  Active Family Member, Pharmacy Records  Spacer/Aero-Holding Chambers (AEROCHAMBER PLUS)  inhaler [id number]  Use with inhaler Van Knee, MD  Active Family Member, Pharmacy Records  thiamine  (VITAMIN B-1) 100 MG tablet [id number]  Take 1 tablet (100 mg total) by mouth daily.  Patient not taking: Reported on 09/16/2023   Caleen Burgess BROCKS, MD  Active   Tocilizumab  (ACTEMRA  ACTPEN) 162 MG/0.9ML EMMANUEL [id number]  Inject 162 mg into the skin every 7 (seven) days. Jeannetta Lonni ORN, MD  Active   traMADol  (ULTRAM ) 50 MG tablet [id number]  Take 1 tablet (50 mg total) by mouth every 6 (six) hours as needed for moderate pain (pain score 4-6).  Patient not taking: Reported on 09/16/2023   Caleen Burgess BROCKS, MD  Active             Recommendation:   PCP Follow-up Specialty provider follow-up as scheduled Surgeon 10/21/23, OT 10/11/23  Follow Up Plan:   Telephone follow up appointment date/time:  11/25/23  Lenn Mean, LCSW [place]  [facility], [facility] Licensed Clinical Social Worker  Direct Dial: [phone]

## 2023-10-07 NOTE — Patient Instructions (Signed)
 Visit Information  Thank you for taking time to visit with me today. Please don't hesitate to contact me if I can be of assistance to you before our next scheduled appointment.  Your next care management appointment is by telephone on 11/25/23 at 11am    Please call the care guide team at [phone] if you need to cancel, schedule, or reschedule an appointment.   Please call the Suicide and Crisis Lifeline: 988 call the [place]  National Suicide Prevention Lifeline: 276-033-6057 or TTY: 515-615-7983 TTY (309) 563-7690) to talk to a trained counselor call 1-800-273-TALK (toll free, 24 hour hotline) if you are experiencing a Mental Health or Behavioral Health Crisis or need someone to talk to.  Renea Schoonmaker, LCSW [place]  [facility], [facility] Licensed Clinical Social Worker  Direct Dial: [phone]

## 2023-10-08 ENCOUNTER — Telehealth: Admitting: *Deleted

## 2023-10-12 ENCOUNTER — Other Ambulatory Visit: Payer: Self-pay | Admitting: Family Medicine

## 2023-10-14 ENCOUNTER — Other Ambulatory Visit: Payer: Self-pay

## 2023-10-14 NOTE — Patient Instructions (Signed)
 Visit Information  Thank you for taking time to visit with me today. Please don't hesitate to contact me if I can be of assistance to you before our next scheduled appointment.  Your next care management appointment is by telephone on 11/11/2023 at 2:00 pm  Telephone follow-up in 1 month  Please call the care guide team at [phone] if you need to cancel, schedule, or reschedule an appointment.   Please call the Suicide and Crisis Lifeline: 988 call the [place]  National Suicide Prevention Lifeline: 229-541-8946 or TTY: 734-187-2764 TTY 325-561-2967) to talk to a trained counselor call 1-800-273-TALK (toll free, 24 hour hotline) go to [facility] [address], [place] [phone]) call 911 if you are experiencing a Mental Health or Behavioral Health Crisis or need someone to talk to.  Nestora Duos, MSN, RN [facility], [facility] RN Care Manager Direct Dial: [phone] Fax: [phone]

## 2023-10-14 NOTE — Telephone Encounter (Signed)
 Per labs in May pt is due for a recheck now to check lipid labs. Please schedule fasting labs to recheck cholesterol (orders are already in)  Med refilled once

## 2023-10-14 NOTE — Telephone Encounter (Signed)
 lvm for pt to call office to schedule appt.

## 2023-10-14 NOTE — Patient Outreach (Signed)
 Complex Care Management   Visit Note  10/14/2023  Name:  Lori Orozco MRN: [id number] DOB: [birth year]  Situation: Referral received for Complex Care Management related to RA, Chronic Pain and Falls I obtained verbal consent from Patient.  Visit completed with Patient  on the phone  Background:   Past Medical History:  Diagnosis Date   Allergy Hayfever   1958   Amaurosis fugax    Anemia    hx   Anxiety    Arthritis    Asthma    Blood transfusion without reported diagnosis    Cataract    Chronic fatigue syndrome    Chronic kidney disease    frequency, nephritis when 73 yrs old   COPD (chronic obstructive pulmonary disease) (HCC)    COVID-19    Depression    Diverticulitis    Emphysema of lung (HCC)    Fibromyalgia    GERD (gastroesophageal reflux disease)    occ   H/O hiatal hernia    History of bronchitis    Hyperlipidemia    Hypothyroidism    Interstitial cystitis    Irritable bowel syndrome    Lichen sclerosus    Lumbar herniated disc    Migraines    Motor nerve conduction block 11/2021   Osteoporosis 09/10/2022   Pneumonia    PONV (postoperative nausea and vomiting)    Shortness of breath    exertion    Thyroid  disease    Graves   Urinary frequency    Urinary tract infection    Vertigo     Assessment: Patient Reported Symptoms:  Cognitive Cognitive Status: No symptoms reported Cognitive/Intellectual Conditions Management [RPT]: None reported or documented in medical history or problem list   Health Maintenance Behaviors: Annual physical exam Healing Pattern: Average  Neurological Oher Neurological Symptoms/Conditions [RPT]: fibromyalgia - weakness no change Neurological Management Strategies: Routine screening, Medication therapy  HEENT HEENT Symptoms Reported: Not assessed      Cardiovascular Cardiovascular Symptoms Reported: Swelling in legs or feet Does patient have uncontrolled Hypertension?: No Cardiovascular Management Strategies:  Routine screening Cardiovascular Comment: chronic swelling in legs/feet - no worse  Respiratory Respiratory Symptoms Reported: No symptoms reported Other Respiratory Symptoms: occasional SOB with exertion - no change Respiratory Management Strategies: Routine screening, Medication therapy  Endocrine Endocrine Symptoms Reported: No symptoms reported Is patient diabetic?: No    Gastrointestinal Gastrointestinal Symptoms Reported: No symptoms reported      Genitourinary Genitourinary Symptoms Reported: Incontinence Additional Genitourinary Details: Myrbetiq helping with urinary issues Genitourinary Management Strategies: Incontinence garment/pad  Integumentary Integumentary Symptoms Reported: No symptoms reported    Musculoskeletal Musculoskelatal Symptoms Reviewed: Back pain, Difficulty walking, Joint pain, Limited mobility, Muscle pain, Unsteady gait, Weakness Other Musculoskeletal Symptoms: fibromyalgia  PT/OT for hand surgery Musculoskeletal Management Strategies: Routine screening, Medication therapy Falls in the past year?: Yes Number of falls in past year: 2 or more Was there an injury with Fall?: Yes Fall Risk Category Calculator: 3 Patient Fall Risk Level: High Fall Risk Patient at Risk for Falls Due to: History of fall(s) Fall risk Follow up: Falls evaluation completed, Falls prevention discussed  Psychosocial Psychosocial Symptoms Reported: Anxiety - if selected complete GAD, Depression - if selected complete PHQ 2-9 Additional Psychological Details: states will discuss adjusting medications, sometimes thinks I could do better - has been on them a long time, appointment with [place] partners in September Behavioral Management Strategies: Medication therapy, Adequate rest Major Change/Loss/Stressor/Fears (CP): Medical condition, self Techniques to Cope with Loss/Stress/Change: Counseling, Medication Quality  of Family Relationships: helpful, supportive, involved Do you feel  physically threatened by others?: No      10/14/2023    2:24 PM  Depression screen PHQ 2/9  Decreased Interest 3  Down, Depressed, Hopeless 3  PHQ - 2 Score 6  Altered sleeping 3  Tired, decreased energy 3  Change in appetite 3  Feeling bad or failure about yourself  1  Trouble concentrating 0  Moving slowly or fidgety/restless 0  Suicidal thoughts 0  PHQ-9 Score 16  Difficult doing work/chores Somewhat difficult    There were no vitals filed for this visit.  Medications Reviewed Today     Reviewed by Devra Lands, RN (Registered Nurse) on 10/14/23 at 1413  Med List Status: <None>   Medication Order Taking? Sig Documenting Provider Last Dose Status Informant  acetaminophen  (TYLENOL ) 500 MG tablet [id number] Yes Take 500 mg by mouth every 6 (six) hours as needed. [provider]  Active Family Member, Pharmacy Records           Med Note LEOBARDO, NICOLE   Sun May 05, 2023 12:44 AM) Last dose unknown  alendronate  (FOSAMAX ) 70 MG tablet [id number] Yes TAKE 1 TABLET BY MOUTH ONCE A WEEK. TAKE WITH A FULL GLASS OF WATER  ON AN EMPTY STOMACH. Jeannetta Lonni ORN, MD  Active   apixaban  (ELIQUIS ) 2.5 MG TABS tablet [id number] Yes Take 1 tablet (2.5 mg total) by mouth 2 (two) times daily. Tower, Laine LABOR, MD  Active   apixaban  (ELIQUIS ) 5 MG TABS tablet [id number]  Take 1 tablet (5 mg total) by mouth 2 (two) times daily for 4 days.  Patient not taking: Reported on 10/14/2023   Randeen Laine LABOR, MD  Expired 08/29/23 2359            Med Note (GREEN, DAVINA E   Mon Jul 29, 2023  3:33 PM) Patient states she takes 1 tablet 2 x per day.  Black Cohosh  40 MG CAPS [id number] Yes Take 1 capsule (40 mg total) by mouth daily. Constant, Peggy, MD  Active Family Member, Pharmacy Records  budesonide -glycopyrrolate -formoterol  (BREZTRI  AEROSPHERE) 160-9-4.8 MCG/ACT AERO inhaler [id number] Yes Inhale 2 puffs into the lungs 2 (two) times daily. Tower, Laine LABOR, MD  Active   buPROPion  (WELLBUTRIN  XL) 150 MG 24  hr tablet [id number] Yes Take 150 mg by mouth daily. [provider]  Active Family Member, Pharmacy Records  busPIRone  (BUSPAR ) 15 MG tablet [id number] Yes Take 15 mg by mouth 2 (two) times daily before a meal. [provider]  Active Family Member, Pharmacy Records  carvedilol  (COREG ) 3.125 MG tablet [id number] Yes TAKE 1 TABLET BY MOUTH TWICE A DAY WITH A MEAL Tower, Marne A, MD  Active   cholecalciferol  (VITAMIN D ) 25 MCG (1000 UNIT) tablet [id number] Yes Take 2,000 Units by mouth daily. 2 tablets daily [provider]  Active Family Member, Pharmacy Records  clobetasol  cream (TEMOVATE ) 0.05 % [id number] Yes APPLY SMALL AMOUNT TO AFFECTED AREAS TWO TO THREE TIMES PER WEEK Tower, Laine LABOR, MD  Active Family Member, Pharmacy Records           Med Note DORI ARVIN FORBES Pablo Jul 29, 2023  3:34 PM) Patient states she  uses as needed.   cyanocobalamin  2000 MCG tablet [id number] Yes Take 500 mcg by mouth daily. [provider]  Active Family Member, Pharmacy Records  diclofenac  Sodium (VOLTAREN ) 1 % GEL [id number] Yes Apply 2 g topically 2 (two) times daily as needed (pain). Dickie,  Sahar, MD  Active Family Member, Pharmacy Records  diphenhydrAMINE  (BENADRYL ) 25 MG tablet [id number] Yes Take 25 mg by mouth every 6 (six) hours as needed for allergies. [provider]  Active Family Member, Pharmacy Records  divalproex  (DEPAKOTE  ER) 500 MG 24 hr tablet [id number] Yes Take 500 mg by mouth at bedtime.  [provider]  Active Family Member, Pharmacy Records  docusate sodium  (COLACE) 100 MG capsule [id number] Yes Take 1 capsule (100 mg total) by mouth 2 (two) times daily as needed for mild constipation. Caleen Burgess BROCKS, MD  Active   famotidine  (PEPCID ) 20 MG tablet [id number] Yes Take 1 tablet (20 mg total) by mouth daily. Tower, Laine LABOR, MD  Active   fluticasone  (FLONASE ) 50 MCG/ACT nasal spray [id number] Yes PLACE 2 SPRAYS INTO THE NOSE DAILY. Neysa Reggy BIRCH, MD  Active  Family Member, Pharmacy Records  HYDROcodone -acetaminophen  (NORCO/VICODIN) 5-325 MG tablet [id number] Yes Take 1 tablet by mouth daily as needed for severe pain (pain score 7-10). Jeannetta Lonni ORN, MD  Active   hydrocortisone cream 1 % [id number] Yes Apply 1 Application topically 2 (two) times daily as needed for itching. [provider]  Active Family Member, Pharmacy Records           Med Note LEOBARDO, NICOLE   Sun May 05, 2023 12:24 AM) Last dose unknown  ipratropium-albuterol  (DUONEB) 0.5-2.5 (3) MG/3ML SOLN [id number] Yes Take 3 mLs by nebulization 2 (two) times daily.  Patient taking differently: Take 3 mLs by nebulization 2 (two) times daily. As needed   Caleen Burgess BROCKS, MD  Active   ketotifen  (ZADITOR ) 0.025 % ophthalmic solution [id number] Yes Place 1 drop into both eyes 2 (two) times daily as needed (allergies). [provider]  Active Family Member, Pharmacy Records  levothyroxine  (SYNTHROID ) 50 MCG tablet [id number] Yes Take 1 tablet (50 mcg total) by mouth daily before breakfast. Tower, Laine LABOR, MD  Active   LORazepam  (ATIVAN ) 1 MG tablet [id number] Yes Take 1 tablet (1 mg total) by mouth at bedtime as needed for anxiety or sleep. Caleen Burgess BROCKS, MD  Active   mirabegron  ER (MYRBETRIQ ) 25 MG TB24 tablet [id number] Yes TAKE 1 TABLET BY MOUTH TWICE A DAY Tower, Marne A, MD  Active   mirtazapine  (REMERON ) 30 MG tablet [id number] Yes Take 30 mg by mouth at bedtime.  [provider]  Active Family Member, Pharmacy Records           Med Note MAYER, ARLYSS RAMAN   Fri Apr 04, 2018  8:11 AM)    ondansetron  (ZOFRAN -ODT) 4 MG disintegrating tablet [id number] Yes Take 1 tablet (4 mg total) by mouth every 8 (eight) hours as needed for nausea or vomiting. Caution of sedation Tower, Laine LABOR, MD  Active   predniSONE  (DELTASONE ) 10 MG tablet [id number] Yes Take 1 tablet (10 mg total) by mouth daily with breakfast. Jeannetta Lonni ORN, MD  Active   Rhubarb (ESTROVEN COMPLETE) 4 MG TABS  [id number] Yes Take 4 mg by mouth daily. Constant, Peggy, MD  Active Family Member, Pharmacy Records  sertraline  (ZOLOFT ) 100 MG tablet [id number] Yes Take 100 mg by mouth daily.  [provider]  Active Family Member, Pharmacy Records           Med Note MAYER ARLYSS RAMAN   Fri Apr 04, 2018  8:12 AM)    simvastatin  (ZOCOR ) 20 MG tablet [id number] Yes TAKE 1 TABLET BY MOUTH AT BEDTIME Tower, Laine LABOR, MD  Active   Spacer/Aero-Holding  Chambers (AEROCHAMBER PLUS) inhaler [id number] Yes Use with inhaler Van Knee, MD  Active Family Member, Pharmacy Records  thiamine  (VITAMIN B-1) 100 MG tablet [id number]  Take 1 tablet (100 mg total) by mouth daily.  Patient not taking: Reported on 09/16/2023   Caleen Burgess BROCKS, MD  Active   Tocilizumab  (ACTEMRA  ACTPEN) 162 MG/0.9ML SOAJ [id number] Yes Inject 162 mg into the skin every 7 (seven) days. Jeannetta Lonni ORN, MD  Active   traMADol  (ULTRAM ) 50 MG tablet [id number]  Take 1 tablet (50 mg total) by mouth every 6 (six) hours as needed for moderate pain (pain score 4-6).  Patient not taking: Reported on 09/16/2023   Caleen Burgess BROCKS, MD  Active             Recommendation:   PCP Follow-up Continue Current Plan of Care  Follow Up Plan:   Telephone follow-up in 1 month  Nestora Duos, MSN, RN [facility]  [facility], [facility] RN Care Manager Direct Dial: [phone] Fax: [phone]

## 2023-10-15 NOTE — Telephone Encounter (Signed)
 Lvmtcb, sent mychart message

## 2023-10-16 ENCOUNTER — Telehealth: Payer: Self-pay | Admitting: *Deleted

## 2023-10-16 NOTE — Telephone Encounter (Signed)
 lvm for pt to call office to schedule appt.

## 2023-10-16 NOTE — Telephone Encounter (Signed)
 Orders are already in (since May), and yes this is a fasting lab, please call pt back and schedule appt

## 2023-10-16 NOTE — Telephone Encounter (Signed)
 Copied from CRM [id number]. Topic: Clinical - Request for Lab/Test Order >> Oct 16, 2023 12:21 PM Lori Orozco wrote: Reason for CRM: Patient says she received phone call yesterday to schedule lab for cholesterol check, she needs order put in and would like to know if this would be a fasting lab. Please call patient to schedule when order is in.

## 2023-10-17 NOTE — Telephone Encounter (Signed)
 lvm for pt to call office to schedule appt.

## 2023-10-20 ENCOUNTER — Other Ambulatory Visit: Payer: Self-pay | Admitting: Internal Medicine

## 2023-10-20 DIAGNOSIS — M8000XS Age-related osteoporosis with current pathological fracture, unspecified site, sequela: Secondary | ICD-10-CM

## 2023-10-21 NOTE — Telephone Encounter (Signed)
 Last Fill: 07/30/2023  Labs: 07/30/2023 RBC 3.77 MCV 100.3 MCHC 31.2 07/02/2023 CMP Glucose 107 BUN 36 Creat 1.12 eGFR 52 BUN 32  Next Visit: 11/11/2023  Last Visit: 07/30/2023  DX: Seronegative rheumatoid arthritis   Current Dose per office note 07/30/2023: Fosamax  70 mg once weekly   Okay to refill Fosamax ?

## 2023-10-29 NOTE — Progress Notes (Signed)
 Office Visit Note  Patient: Lori Orozco             Date of Birth: 06/23/1950           MRN: [id number]             PCP: Randeen Laine LABOR, MD Referring: Tower, Laine LABOR, MD Visit Date: 11/11/2023   Subjective:  Injections (Pain in the left hand causing immobility. )   Discussed the use of AI scribe software for clinical note transcription with the patient, who gave verbal consent to proceed.  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 Currently she complains of increased pain in her left elbow and left wrist.  She underwent surgery on her right hand, which included wrist fusion and MCP joint replacement. There is no pain in the right hand post-surgery, although she is unable to make a tight fist yet. She is currently undergoing occupational therapy and is exercising to improve hand function.  She experiences significant pain in her left elbow, describing it as severe and stating, 'I can't even touch the bone.' She has had previous surgery on the underside of the elbow at the medial epicondyle but this is located in a different area.  Additionally, she experiences pain in her left thumb, particularly in the muscle and extending down into the inside of the thumb. She notes that the rest of the hand feels bruised but not acutely painful.  She is currently on Actemra  injections and reports feeling 'pretty good' with no noticeable side effects. However, she has not observed the same rapid improvement as she did with Enbrel , which she described as 'night and day.'   Previous HPI 07/30/2023 Lori Orozco is a 73 y.o. 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  She presents with worsening muscle and bone pain.   She has been experiencing worsening muscle and bone pain over the past few days, significantly impacting her mobility  and sleep. She describes difficulty getting out of her chair due to weakness and pain, and states that the pain wakes her up at night. Her muscles feel like they are 'grabbing' and causing significant discomfort, particularly in her groin area, requiring her to hold onto the bed to turn over. She took hydrocodone  last night to alleviate the pain.   Her symptoms have been worsening over the past week, despite a temporary improvement with a higher dose of prednisone . No fever, but she reports experiencing chills. She is currently taking 10 mg of prednisone . Her rheumatoid arthritis has been previously managed with Enbrel , which initially worked well, but now her symptoms have returned.   She reports ongoing issues with her hands, particularly her right hand, which is painful and less functional. The left hand is better than the right but still problematic. She has a history of shoulder replacement and a pinched nerve in her neck, which causes pain when lying back.   07/02/2023 Lori FAIELLA is a 73 y.o. 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  She presents with severe wrist pain and hand dysfunction.   She experiences severe wrist pain described as 'insane pain' that is not relieved by painkillers. The pain is exacerbated by movement and touch, making it difficult for her  to push up from a chair. She has tried icing and applying heat without relief.   She describes dysfunction in her hand, noting an inability to straighten her fingers except for her thumb and two fingers. A history of wrist surgery previously alleviated her pain, but now arthritis has set in, causing significant discomfort and swelling. She is currently taking 10 mg of prednisone  but has not noticed any reduction in swelling or pain. She also mentions being on blood thinners and has previously used Enbrel  for her rheumatoid arthritis.   She recalls a previous hand  surgeon, Dr. Norleen Cowing  who performed surgery on her hand, and another surgeon, Dr. Romona, who was hesitant to perform further surgery due to the complexity of her condition.   She experiences difficulty using her hand for daily activities, such as eating, due to the pain and limited mobility. She reports experiencing fever-like symptoms but is unsure of their significance. No other areas of flare-up outside of her wrist.    12/24/2022 Lori Orozco is a 73 y.o. 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  She also started on Fosamax  70 mg once weekly for osteoporosis without any trouble taking the medication.  She had 1 episode of increased stiffness and swelling throughout proximal joints in her right hand this lasted for about 3 days.  She did not change her medications or take anything different to resolve this.  Saw Dr. Romona to discuss options to improve use of her right hand is waiting to hear back on recommendation or other options.  She just had a fall fell forward in the bathroom striking on her left shoulder and left side of the neck and base of skull.  Before this her last major fall was late last year has had about 2 or 3 other falls in total with suddenly losing balance.   09/10/22 Lori Orozco is a 73 y.o. female here for follow up for seronegative RA on Enbrel  50 mg [place] weekly and prednisone  10 mg daily. She has significant pain in her back and legs interval workup indicating nerve impingement and is scheduled for injections with Dr. Eldonna for this. MRI also with vertebral fractures probably chronic. In her arms right wrist remains painful and decreased mobility worst on the ulnar side.   05/30/22 Lori Orozco is a 73 y.o. 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  She was taking the medications without interruptions  but started experiencing increased joint pain swelling and stiffness especially affecting her hands wrists and elbows since about 2 weeks ago.  She called about this and we increased her to 20 mg prednisone  daily up from 10 mg and she has noticed improvement in symptoms especially her elbows.  No longer swollen and is able to tolerate resting her arms on a flat surface without severe pain.  She did suffer from upper respiratory symptoms last week but these have mostly resolved without any new medication needed.    02/28/22 ZIZA HASTINGS is a 73 y.o. female here for follow up for seronegative rheumatoid arthritis on Enbrel  50 mg subcu weekly and prednisone  10 mg daily.  She had to stop taking the sulfasalazine  since about 2 weeks ago due to running out of the medication.  She had change with insurance status so having a dramatically increased cost  for her medications at the moment that she is having to sort out.  She was tolerating the medicine okay.  She did not recall a noticeable difference in symptoms with starting the sulfasalazine  but while off the medication has felt noticeably worse so thinks it was doing something.  Particularly the left wrist pain has been a lot better on treatment her right wrist continues to be bad before and after.  She saw Dr. Onetha for her neck recommendation was for physical therapy but she has not pursued this since and does not feel like she would tolerate this well based on previous therapy experiences.  She does have plans for trying the nerve stimulator treatment starting later this month.    12/21/21 LILIYANA THOBE is a 73 y.o. female here for follow up for seronegative inflammatory arthritis on Enbrel  50 mg subcu weekly.  She continues to have pain and stiffness affecting her hands bilaterally although the severity of swelling has gotten notably better on the Enbrel  treatment but is far from resolved.  Her biggest concern at the moment is about her neck pain  concern of cervical radiculopathy.  We checked x-ray that showed considerable multilevel degenerative disease with no acute appearing bony abnormality.  She is followed up with her spine doctor with epidural block injection tried did not find it very beneficial at all.  She has discussed radiofrequency ablation as a neck step option for radicular pain.   10/09/2021 SHIORI ADCOX is a 72 y.o. female here for follow up for seronegative inflammatory arthritis on Enbrel  50 mg  weekly and prednisone  10 mg daily. She tried tapering prednisone  but had severe worsening symptoms at 5 mg dose. She increased back to 10 mg daily and symptoms improved somewhat but still a lot of pain and swelling in both hands and wrists. She has ongoing back pain and stiffness and is seeing D.r Onetha for this with known significant lumbar spine arthritis. More recently new problem with sensitivity and stinging type pain throughout both arms with even light pressure such as showering water . She also has worsening weakness in her proximal legs unable to stand without using arms, which are limited by pain.   07/05/2021 CAMARI QUINTANILLA is a 73 y.o. female here for follow up for seronegative inflammatory arthritis on prednisone  20 mg daily dose and enbrel  50mg  subcutaneous once weekly. She has seen a large improvement in joint inflammation since starting the Enbrel . She has pain and redness around the injection site lasting up to 3 days with each dose. Wrists continue to hurt and have decreased movement left wrist actually more symptomatic than right now. She is working with physical therapy on getting back to walking currently very weak and unstable due to illness and deconditioning. She had recent ED visit for COPD exacerbation finished antibiotics for this. No other recent infection issues.   Previous HPI 02/08/2021 ERIYONNA MATSUSHITA is a 73 y.o. female here for follow up with seronegative inflammatory arthritis on prednisone   taper at 15 mg daily dose.  She has had major medical events since her last visit.  She developed increased knee pain and swelling on the right side.  However she went to the hospital due to developing more weakness and shortness of breath CT angiogram identified bilateral pulmonary emboli.  Treatment with anticoagulation led to significant gastrointestinal bleeding from previously unknown AVM.  This was treated also in the hospital but overall protracted complications lasted for about a month hospitalization.  During this time  she experienced worsening joint pain and swelling and significant deconditioning and weakness.  She was discharged from the hospital to rehab facility she was not continued on any steroid or other anti-inflammatory treatment reports joint pain and swelling affecting numerous joints in bilateral elbows wrists fingers and knees.  This is limited any significant progress with physical therapy so far.  She called back to clinic earlier this month with the symptoms and was restarted on a prednisone  taper currently down to 15 mg daily.  She saw orthopedics clinic for right knee aspiration injection 2 days ago that is partially helping.   11/15/20 TENIYAH SEIVERT is a 73 y.o. female here for follow up with wrist wrist extensor tenosynovitis and presumed seronegative inflammatory arthritis also involving knee joints after starting methotrexate  15 mg PO weekly and tapering prednisone  off. Her wrist is slightly improved but remains very painful and swollen with decreased mobility. After starting methotrexate  she has noticed some episodes of dizziness and urinary retention and frequency. She stopped the prednisone  without noticing much difference in symptoms so far.     09/20/20 LUCETTA BAEHR is a 73 y.o. female here for joint pain and swelling of knees and wrists with severe tenosynovitis s/p tenosynovectomy on 09/01/20 with Dr. Vernetta.  She had been feeling in her usual health until June  she recalls onset of symptoms after using a gas leaf blower at her home during which time she experienced some mild right wrist pain.  She does not recall any particular injury event at this time.  However she experienced progressive ongoing increase in pain with swelling and stiffness in her right wrist especially with decreased range of motion and pain over the dorsal aspect.  This is evaluated at orthopedics clinic with aspiration that revealed just coagulated sample trial of steroid medication and only partially improved symptoms then continued worsening.  She was admitted to the hospital for tenosynovectomy that was performed without complication.  Intraoperatively reported extensive inflammatory tissue debridement without any evidence concerning for infection.  During this hospitalization she also developed knee effusion which was aspirated demonstrating inflammatory synovial fluid with negative microscopy and cultures.  Is now almost 3 weeks since her surgery she continues experiencing severe pain intensity swelling around the right hand and wrist and has developed some skin blistering.     Labs reviewed 08/2020 Synovial fluid knee 4,525 WBCs 90% neutrophils ANA neg RF 14.2 CCP neg ESR 37 CRP 14.1 Uric acid 4.9   07/2020 Synovial fluid wrist clotted sample negative gram stain   Review of Systems  Constitutional:  Positive for fatigue.  HENT:  Positive for mouth dryness. Negative for mouth sores.   Eyes:  Negative for dryness.  Respiratory:  Positive for shortness of breath.   Cardiovascular:  Negative for chest pain and palpitations.  Gastrointestinal:  Negative for blood in stool, constipation and diarrhea.  Endocrine: Positive for increased urination.  Genitourinary:  Positive for involuntary urination.  Musculoskeletal:  Positive for joint pain, gait problem, joint pain, joint swelling, myalgias, muscle weakness, morning stiffness, muscle tenderness and myalgias.  Skin:  Positive  for hair loss. Negative for color change, rash and sensitivity to sunlight.  Allergic/Immunologic: Negative for susceptible to infections.  Neurological:  Positive for dizziness. Negative for headaches.  Hematological:  Negative for swollen glands.  Psychiatric/Behavioral:  Positive for depressed mood and sleep disturbance. The patient is nervous/anxious.     PMFS History:  Patient Active Problem List   Diagnosis Date Noted   Lateral epicondylitis,  left elbow 11/11/2023   Tenosynovitis, de Quervain 11/11/2023   Positive colorectal cancer screening using Cologuard test 08/12/2023   Colon cancer screening 07/17/2023   Diastolic dysfunction 06/06/2023   Class 2 severe obesity due to excess calories with serious comorbidity and body mass index (BMI) of 35.0 to 35.9 in adult (HCC) 06/06/2023   CKD stage 3b, GFR 30-44 ml/min (HCC) 05/05/2023   Elevated troponin 05/05/2023   Depression with anxiety 05/05/2023   Cerumen impaction 03/06/2023   Lesion of palate 03/06/2023   Mobility impaired 02/10/2023   Generalized weakness 02/08/2023   Recurrent falls 12/24/2022   Pre-operative clearance 10/30/2022   Diarrhea 10/19/2022   Cold intolerance 10/19/2022   Osteoporosis 09/10/2022   Thoracic compression fracture (HCC) 07/13/2022   Diabetes mellitus screening 07/13/2022   Lumbar herniated disc    Chronic pain 11/28/2021   Bilateral leg weakness 10/09/2021   Vitamin B12 deficiency 08/02/2021   Myofascial pain dysfunction syndrome 08/02/2021   Adverse effect of prednisone  07/11/2021   AVM (arteriovenous malformation) 01/03/2021   History of pulmonary embolism 12/08/2020   Arthritis of knee 12/06/2020   Bilateral knee pain 09/20/2020   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   High risk medication use 09/20/2020   Extensor tenosynovitis of right wrist 09/15/2020   S/P reverse total shoulder arthroplasty, 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   Lung nodule 01/02/2020   Status post arthroscopy of left  shoulder 01/16/2018   Estrogen deficiency 06/28/2017   Medial epicondylitis, left elbow 04/29/2017   S/P arthroscopy of left shoulder 02/25/2017   Impingement syndrome of left shoulder 01/30/2017   Trochanteric bursitis, right hip 04/10/2016   Hypertension 09/02/2015   Urge incontinence 07/27/2015   Obesity (BMI 30-39.9) 04/28/2015   Osteoarthritis of left knee 01/28/2015   Status post total left knee replacement 01/28/2015   Vitamin D  deficiency 04/27/2014   Seasonal and perennial allergic rhinitis 06/17/2013   S/P total hip arthroplasty 03/20/2013   Medicare annual wellness visit, subsequent 11/18/2012   At high risk for falls 11/18/2012   Routine general medical examination at a health care facility 11/10/2012   Lichen sclerosus et atrophicus of the vulva 07/29/2012   Osteopenia 10/19/2011   Hypothyroidism 10/19/2011   Hereditary and idiopathic peripheral neuropathy 08/05/2009   Low back pain 08/05/2009   HAND PAIN, BILATERAL 08/05/2009   TREMOR 03/09/2008   MENOPAUSAL SYNDROME 10/09/2007   DEPRESSION, MAJOR 05/20/2007   BIPOLAR AFFECTIVE DISORDER 05/20/2007   Generalized anxiety disorder 05/20/2007   PERSONALITY DISORDER 05/20/2007   ESOPHAGEAL SPASM 05/20/2007   Diaphragmatic hernia 05/20/2007   COPD mixed type (HCC) 03/27/2007   HYPERCHOLESTEROLEMIA, PURE 12/10/2006   SYMPTOM, SYNDROME, CHRONIC FATIGUE 12/10/2006    Past Medical History:  Diagnosis Date   Allergy Hayfever   1958   Amaurosis fugax    Anemia    hx   Anxiety    Arthritis    Asthma    Blood transfusion without reported diagnosis    Cataract    Chronic fatigue syndrome    Chronic kidney disease    frequency, nephritis when 73 yrs old   COPD (chronic obstructive pulmonary disease) (HCC)    COVID-19    Depression    Diverticulitis    Emphysema of lung (HCC)    Fibromyalgia    GERD (gastroesophageal reflux disease)    occ   H/O hiatal hernia    History of bronchitis    Hyperlipidemia     Hypothyroidism    Interstitial cystitis    Irritable bowel syndrome  Lichen sclerosus    Lumbar herniated disc    Migraines    Motor nerve conduction block 11/2021   Osteoporosis 09/10/2022   Pneumonia    PONV (postoperative nausea and vomiting)    Shortness of breath    exertion    Thyroid  disease    Graves   Urinary frequency    Urinary tract infection    Vertigo     Family History  Problem Relation Age of Onset   Arthritis Mother    Hypertension Mother    Kidney disease Mother    Heart disease Mother    Stroke Mother    Cancer Father        Bladder   Alcohol abuse Father    Arthritis Sister    Heart disease Sister    Colon cancer Other    Cancer - Lung Cousin    Arthritis Maternal Grandmother    Hearing loss Maternal Grandfather    Varicose Veins Maternal Uncle    Past Surgical History:  Procedure Laterality Date   ABDOMINAL HYSTERECTOMY     bladder sugery      CAROTID DOPPLAR     COLONOSCOPY  05/26/2010   avms- otherwise nl , re check 10y   DEXA-OSTEOPENIA     DOPPLER ECHOCARDIOGRAPHY     elbow surgery     EPICONDYLITIS     EYE SURGERY Bilateral    cataracts   FOOT SURGERY Bilateral    HAND SURGERY Right 09/10/2023   I & D EXTREMITY Right 09/01/2020   Procedure: TYNOSYNOVECTOMY;  Surgeon: Vernetta Lonni GRADE, MD;  Location: [facility] OR;  Service: Orthopedics;  Laterality: Right;   IMPLANTATION VAGAL NERVE STIMULATOR  03/2022   JOINT REPLACEMENT     KNEE SURGERY     Left cartilage   lipoma in second finger right hand     MOUTH SURGERY  08/22/2022   PLANTAR FASCIA SURGERY Left    REVERSE SHOULDER ARTHROPLASTY Left 02/02/2020   Procedure: LEFT REVERSE SHOULDER ARTHROPLASTY;  Surgeon: Addie Cordella Hamilton, MD;  Location: [facility] OR;  Service: Orthopedics;  Laterality: Left;   ROTATOR CUFF REPAIR Left 12/2017   TEMPOROMANDIBULAR JOINT SURGERY     TONSILLECTOMY     TOTAL HIP ARTHROPLASTY Right 03/20/2013   Procedure: Right TOTAL HIP ARTHROPLASTY;  Surgeon:  Jerona LULLA Sage, MD;  Location: [facility] OR;  Service: Orthopedics;  Laterality: Right;  Right Total Hip Arthroplasty   TOTAL KNEE ARTHROPLASTY Left 01/28/2015   Procedure: LEFT TOTAL KNEE ARTHROPLASTY;  Surgeon: Lonni GRADE Vernetta, MD;  Location: [facility] ORS;  Service: Orthopedics;  Laterality: Left;   TRIGGER FINGER RELEASE Right    TROCHANTERIC BURSA EXCISION Right 05/03/2016   Dr. Vernetta Lonni   WRIST ARTHROSCOPY Right    ligament tear   Social History   Social History Narrative   Not on file   Immunization History  Administered Date(s) Administered   INFLUENZA, HIGH DOSE SEASONAL PF 12/13/2016, 10/30/2022   Influenza Split 11/19/2011, 11/02/2013   Influenza Whole 12/20/2006, 11/25/2008, 11/26/2009, 11/24/2010   Influenza,inj,Quad PF,6+ Mos 12/06/2015, 11/28/2021   Influenza-Unspecified 12/08/2012, 12/03/2014, 12/08/2017, 11/10/2018, 10/31/2019   Moderna Sars-Covid-2 Vaccination 06/21/2019, 07/19/2019, 01/22/2020   PFIZER(Purple Top)SARS-COV-2 Vaccination 06/04/2020, 10/30/2022   Pfizer Covid-19 Vaccine Bivalent Booster 61yrs & up 12/29/2021   Pneumococcal Conjugate-13 12/06/2015   Pneumococcal Polysaccharide-23 01/26/2002, 03/23/2008, 09/07/2010, 06/28/2017   Respiratory Syncytial Virus Vaccine,Recomb Aduvanted(Arexvy) 12/29/2021   Td 06/26/2000, 10/19/2011, 02/14/2022   Zoster Recombinant(Shingrix) 09/16/2017, 08/11/2022     Objective: Vital Signs: BP ROLLEN)  117/57   Pulse 74   Temp 97.9 F (36.6 C)   Resp 18   Ht 5' 6.5 (1.689 m)   Wt 221 lb 6.4 oz (100.4 kg)   BMI 35.20 kg/m    Physical Exam Eyes:     Conjunctiva/sclera: Conjunctivae normal.  Cardiovascular:     Rate and Rhythm: Normal rate and regular rhythm.  Pulmonary:     Effort: Pulmonary effort is normal.     Breath sounds: Normal breath sounds.  Skin:    General: Skin is warm and dry.     Findings: Bruising present.  Neurological:     Mental Status: She is alert.  Psychiatric:        Mood and  Affect: Mood normal.      Musculoskeletal Exam:  Diffuse tenderness to pressure throuought neck and back, arms, and legs Shoulders full ROM no tenderness or swelling Left elbow painful to pressure at lateral epicondyle, no palpable effusion and full ROM Right wrist fixation postsurgical changes, MCP extension ROM limited, tenderness to pressure Left wrist pain at radial side and at 1st [facility] joint with overlying swelling, no warmth or erythema Hip normal internal and external rotation without pain, no tenderness to lateral hip palpation Knees full ROM no tenderness or swelling  Investigation: No additional findings.  Imaging: No results found.   Recent Labs: Lab Results  Component Value Date   WBC 6.4 07/30/2023   HGB 11.8 07/30/2023   PLT 198 07/30/2023   NA 140 07/02/2023   K 4.9 07/02/2023   CL 105 07/02/2023   CO2 30 07/02/2023   GLUCOSE 107 (H) 07/02/2023   BUN 36 (H) 07/02/2023   CREATININE 1.12 (H) 07/02/2023   BILITOT 0.4 07/02/2023   ALKPHOS 40 05/04/2023   AST 13 07/02/2023   ALT 11 07/02/2023   PROT 6.4 07/02/2023   ALBUMIN 3.2 (L) 05/04/2023   CALCIUM 9.3 07/02/2023   GFRAA >60 01/29/2015   QFTBGOLDPLUS NEGATIVE 11/07/2022    Speciality Comments: No specialty comments available.  Procedures:  Small Joint Inj: L thumb [facility] on 11/11/2023 1:55 PM Indications: pain Details: 27 G needle, radial approach Medications: 0.5 mL lidocaine  1 %; 20 mg triamcinolone  acetonide 40 MG/ML Outcome: tolerated well, no immediate complications Procedure, treatment alternatives, risks and benefits explained, specific risks discussed. Consent was given by the patient. Immediately prior to procedure a time out was called to verify the correct patient, procedure, equipment, support staff and site/side marked as required. Patient was prepped and draped in the usual sterile fashion.    Medium Joint Inj: L lateral epicondyle on 11/11/2023 2:00 PM Indications: pain Details: 27 G 1.5  in needle, lateral approach Medications: 1 mL lidocaine  1 %; 40 mg triamcinolone  acetonide 40 MG/ML Procedure, treatment alternatives, risks and benefits explained, specific risks discussed. Consent was given by the patient. Immediately prior to procedure a time out was called to verify the correct patient, procedure, equipment, support staff and site/side marked as required. Patient was prepped and draped in the usual sterile fashion.     Allergies: Lithium; Tegretol [carbamazepine]; Tricyclic antidepressants; Methotrexate  derivatives; Strawberry extract; Tapentadol; Codeine; Cymbalta [duloxetine hcl]; Erythromycin; Lyrica  [pregabalin ]; Neurontin [gabapentin]; Oxycodone ; Rabeprazole sodium; Baclofen ; Duraprep [antiseptic products, misc.]; Penicillins; and Tape   Assessment / Plan:     Visit Diagnoses: Seronegative rheumatoid arthritis (HCC) - Plan: Tocilizumab  (ACTEMRA  ACTPEN) 162 MG/0.9ML SOAJ, Sedimentation rate Pain in left elbow and thumb due to overuse post right hand surgery. Suspected lateral epicondylitis. Actemra  less effective than  Enbrel  was at the outset per patient, but overall symptoms trending to better. - Rechecking sed rate for disease monitoring - Continue Actemra  162 mg [place] weekly - Continue prednisone  10 mg daily for now, aim to taper after 3mos steady on actemra   High risk medication use - Plan to start Actemra  162 mg [place] q14days due to Enbrel  loss of efficacy - Plan: CBC with Differential/Platelet, Comprehensive metabolic panel with GFR Recent lipid panel reviewed was normal. Needs updated CBC and CMP for drug monitoring after actemra  start at high dose. No local injection reactions, no serious interval infections. FR  Lateral epicondylitis, left elbow Injection at lateral epicondyle for pain suspect related to overuse after recent right hand surgery.  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  Status post right wrist fusion and joint replacement No pain in right hand, significant improvement noted. Limited fist formation, working on function recovery. - Continue occupational therapy to improve hand function.        Orders: Orders Placed This Encounter  Procedures   Small Joint Inj   Medium Joint Inj   Sedimentation rate   CBC with Differential/Platelet   Comprehensive metabolic panel with GFR   Meds ordered this encounter  Medications   Tocilizumab  (ACTEMRA  ACTPEN) 162 MG/0.9ML SOAJ    Sig: Inject 162 mg into the skin every 7 (seven) days.    Dispense:  10.8 mL    Refill:  0     Follow-Up Instructions: Return in about 3 months (around 02/10/2024) for RA on TOC/inj f/u 3mos.   Lonni LELON Ester, MD  Note - This record has been created using [employer].  Chart creation errors have been sought, but may not always  have been located. Such creation errors do not reflect on  the standard of medical care.

## 2023-10-31 ENCOUNTER — Other Ambulatory Visit: Payer: Self-pay | Admitting: Pharmacy Technician

## 2023-10-31 ENCOUNTER — Other Ambulatory Visit (INDEPENDENT_AMBULATORY_CARE_PROVIDER_SITE_OTHER)

## 2023-10-31 ENCOUNTER — Other Ambulatory Visit (HOSPITAL_COMMUNITY): Payer: Self-pay

## 2023-10-31 ENCOUNTER — Ambulatory Visit: Payer: Self-pay | Admitting: Family Medicine

## 2023-10-31 ENCOUNTER — Other Ambulatory Visit: Payer: Self-pay

## 2023-10-31 DIAGNOSIS — E7800 Pure hypercholesterolemia, unspecified: Secondary | ICD-10-CM

## 2023-10-31 LAB — LIPID PANEL
Cholesterol: 183 mg/dL (ref 0–200)
HDL: 62.4 mg/dL (ref >39.00)
LDL Cholesterol: 97 mg/dL (ref 0–99)
NonHDL: 120.73
Total CHOL/HDL Ratio: 3
Triglycerides: 119 mg/dL (ref 0.0–149.0)
VLDL: 23.8 mg/dL (ref 0.0–40.0)

## 2023-10-31 NOTE — Progress Notes (Signed)
 Specialty Pharmacy Refill Coordination Note  Lori Orozco is a 73 y.o. female contacted today regarding refills of specialty medication(s) Tocilizumab  (Actemra  ACTPen)   Patient requested Delivery   Delivery date: 11/05/23   Verified address: [address]   [place] [address]   Medication will be filled on 11/04/23.  Injection due on : 9/8 & 9/15

## 2023-11-11 ENCOUNTER — Encounter: Payer: Self-pay | Admitting: Internal Medicine

## 2023-11-11 ENCOUNTER — Ambulatory Visit: Attending: Internal Medicine | Admitting: Internal Medicine

## 2023-11-11 ENCOUNTER — Other Ambulatory Visit: Payer: Self-pay

## 2023-11-11 ENCOUNTER — Telehealth: Payer: Self-pay

## 2023-11-11 VITALS — BP 117/57 | HR 74 | Temp 97.9°F | Resp 18 | Ht 66.5 in | Wt 221.4 lb

## 2023-11-11 DIAGNOSIS — M7712 Lateral epicondylitis, left elbow: Secondary | ICD-10-CM | POA: Insufficient documentation

## 2023-11-11 DIAGNOSIS — M654 Radial styloid tenosynovitis [de Quervain]: Secondary | ICD-10-CM | POA: Insufficient documentation

## 2023-11-11 DIAGNOSIS — M0600 Rheumatoid arthritis without rheumatoid factor, unspecified site: Secondary | ICD-10-CM | POA: Diagnosis present

## 2023-11-11 DIAGNOSIS — Z7952 Long term (current) use of systemic steroids: Secondary | ICD-10-CM | POA: Diagnosis present

## 2023-11-11 DIAGNOSIS — Z79899 Other long term (current) drug therapy: Secondary | ICD-10-CM | POA: Diagnosis present

## 2023-11-11 MED ORDER — LIDOCAINE HCL 1 % IJ SOLN
1.0000 mL | INTRAMUSCULAR | Status: AC | PRN
  Administered 2023-11-11: 1 mL

## 2023-11-11 MED ORDER — LIDOCAINE HCL 1 % IJ SOLN
0.5000 mL | INTRAMUSCULAR | Status: AC | PRN
  Administered 2023-11-11: .5 mL

## 2023-11-11 MED ORDER — ACTEMRA ACTPEN 162 MG/0.9ML ~~LOC~~ SOAJ
162.0000 mg | SUBCUTANEOUS | 0 refills | Status: DC
  Filled 2023-11-11: qty 10.8, 84d supply, fill #0
  Filled 2023-11-25: qty 3.6, 28d supply, fill #0
  Filled 2023-12-19: qty 3.6, 28d supply, fill #1
  Filled 2024-01-20: qty 3.6, 28d supply, fill #2

## 2023-11-11 MED ORDER — TRIAMCINOLONE ACETONIDE 40 MG/ML IJ SUSP
40.0000 mg | INTRAMUSCULAR | Status: AC | PRN
  Administered 2023-11-11: 40 mg via INTRA_ARTICULAR

## 2023-11-11 MED ORDER — TRIAMCINOLONE ACETONIDE 40 MG/ML IJ SUSP
20.0000 mg | INTRAMUSCULAR | Status: AC | PRN
  Administered 2023-11-11: 20 mg via INTRA_ARTICULAR

## 2023-11-11 NOTE — Patient Instructions (Signed)
 [place] Lori Orozco - I am sorry I was unable to reach you today for our scheduled appointment. I work with [place], Laine LABOR, MD and am calling to support your healthcare needs. Please contact me at [phone] at your earliest convenience. I look forward to speaking with you soon.   Thank you,  Nestora Duos, MSN, RN [facility]  [facility], [facility] RN Care Manager Direct Dial: [phone] Fax: [phone]

## 2023-11-12 LAB — CBC WITH DIFFERENTIAL/PLATELET
Absolute Lymphocytes: 3026 {cells}/uL (ref 850–3900)
Absolute Monocytes: 524 {cells}/uL (ref 200–950)
Basophils Absolute: 20 {cells}/uL (ref 0–200)
Basophils Relative: 0.3 %
Eosinophils Absolute: 61 {cells}/uL (ref 15–500)
Eosinophils Relative: 0.9 %
HCT: 38.8 % (ref 35.0–45.0)
Hemoglobin: 11.8 g/dL (ref 11.7–15.5)
MCH: 29.4 pg (ref 27.0–33.0)
MCHC: 30.4 g/dL — ABNORMAL LOW (ref 32.0–36.0)
MCV: 96.5 fL (ref 80.0–100.0)
MPV: 10.4 fL (ref 7.5–12.5)
Monocytes Relative: 7.7 %
Neutro Abs: 3169 {cells}/uL (ref 1500–7800)
Neutrophils Relative %: 46.6 %
Platelets: 203 Thousand/uL (ref 140–400)
RBC: 4.02 Million/uL (ref 3.80–5.10)
RDW: 14.7 % (ref 11.0–15.0)
Total Lymphocyte: 44.5 %
WBC: 6.8 Thousand/uL (ref 3.8–10.8)

## 2023-11-12 LAB — SEDIMENTATION RATE: Sed Rate: 6 mm/h (ref 0–30)

## 2023-11-12 LAB — COMPREHENSIVE METABOLIC PANEL WITH GFR
AG Ratio: 2.2 (calc) (ref 1.0–2.5)
ALT: 12 U/L (ref 6–29)
AST: 15 U/L (ref 10–35)
Albumin: 4.1 g/dL (ref 3.6–5.1)
Alkaline phosphatase (APISO): 38 U/L (ref 37–153)
BUN/Creatinine Ratio: 23 (calc) — ABNORMAL HIGH (ref 6–22)
BUN: 26 mg/dL — ABNORMAL HIGH (ref 7–25)
CO2: 30 mmol/L (ref 20–32)
Calcium: 9.2 mg/dL (ref 8.6–10.4)
Chloride: 99 mmol/L (ref 98–110)
Creat: 1.12 mg/dL — ABNORMAL HIGH (ref 0.60–1.00)
Globulin: 1.9 g/dL (ref 1.9–3.7)
Glucose, Bld: 84 mg/dL (ref 65–99)
Potassium: 4.7 mmol/L (ref 3.5–5.3)
Sodium: 144 mmol/L (ref 135–146)
Total Bilirubin: 0.7 mg/dL (ref 0.2–1.2)
Total Protein: 6 g/dL — ABNORMAL LOW (ref 6.1–8.1)
eGFR: 52 mL/min/1.73m2 — ABNORMAL LOW (ref >=60)

## 2023-11-13 ENCOUNTER — Encounter: Payer: Self-pay | Admitting: Physician Assistant

## 2023-11-13 ENCOUNTER — Ambulatory Visit (INDEPENDENT_AMBULATORY_CARE_PROVIDER_SITE_OTHER): Admitting: Physician Assistant

## 2023-11-13 DIAGNOSIS — M7062 Trochanteric bursitis, left hip: Secondary | ICD-10-CM

## 2023-11-13 MED ORDER — METHYLPREDNISOLONE ACETATE 40 MG/ML IJ SUSP
40.0000 mg | INTRAMUSCULAR | Status: AC | PRN
  Administered 2023-11-13: 40 mg via INTRA_ARTICULAR

## 2023-11-13 MED ORDER — LIDOCAINE HCL 1 % IJ SOLN
3.0000 mL | INTRAMUSCULAR | Status: AC | PRN
  Administered 2023-11-13: 3 mL

## 2023-11-13 NOTE — Progress Notes (Signed)
 HPI: Lori Orozco comes in today with a complaint of left buttocks pain.  She has had radicular symptoms down the right leg in the past.  He was seeing Dr. Georgina for this.  However she states she is having no radicular symptoms down the left leg pain is worse with sitting and lying on the left hip.  No groin pain.  Describes it as sharp stabbing pain.  No injury.  Since she was last seen by Dr. Vernetta and myself she has been diagnosed with rheumatoid arthritis and underwent a right wrist fusion and multiple metatarsal phalangeal joint arthroplasties.  She sees Dr. Jeannetta for her rheumatoid arthritis.  She is not a diabetic.  Denies any fevers chills.  Review of systems: See HPI otherwise negative  Physical exam: General Well-developed well-nourished female in no acute distress. Psych: Alert and oriented x 3 Bilateral hips: Good range of motion of both hips without pain.  Tenderness over the left hip trochanteric region no tenderness over the right hip.  Impression: Left hip trochanteric bursitis  Plan: Offered her trochanteric injection she was agreeable.  She tolerated the injection well today.  She was shown IT band stretching exercises.  She will follow-up with us  as needed if pain persist or becomes worse.  Questions were encouraged and answered at length.    Procedure Note  Patient: Lori Orozco             Date of Birth: August 02, 1950           MRN: [id number]             Visit Date: 11/13/2023  Procedures: Visit Diagnoses:  1. Trochanteric bursitis, left hip     Large Joint Inj on 11/13/2023 11:56 AM Indications: pain Details: 22 G 1.5 in needle, lateral approach  Arthrogram: No  Medications: 3 mL lidocaine  1 %; 40 mg methylPREDNISolone  acetate 40 MG/ML Outcome: tolerated well, no immediate complications Procedure, treatment alternatives, risks and benefits explained, specific risks discussed. Consent was given by the patient. Immediately prior to procedure a time out was  called to verify the correct patient, procedure, equipment, support staff and site/side marked as required. Patient was prepped and draped in the usual sterile fashion.

## 2023-11-22 ENCOUNTER — Encounter: Payer: Self-pay | Admitting: Family Medicine

## 2023-11-22 ENCOUNTER — Telehealth (INDEPENDENT_AMBULATORY_CARE_PROVIDER_SITE_OTHER): Admitting: Family Medicine

## 2023-11-22 VITALS — Wt 221.0 lb

## 2023-11-22 DIAGNOSIS — J441 Chronic obstructive pulmonary disease with (acute) exacerbation: Secondary | ICD-10-CM

## 2023-11-22 DIAGNOSIS — N1832 Chronic kidney disease, stage 3b: Secondary | ICD-10-CM

## 2023-11-22 DIAGNOSIS — J449 Chronic obstructive pulmonary disease, unspecified: Secondary | ICD-10-CM

## 2023-11-22 DIAGNOSIS — E66812 Obesity, class 2: Secondary | ICD-10-CM

## 2023-11-22 DIAGNOSIS — Z86711 Personal history of pulmonary embolism: Secondary | ICD-10-CM

## 2023-11-22 DIAGNOSIS — M0600 Rheumatoid arthritis without rheumatoid factor, unspecified site: Secondary | ICD-10-CM

## 2023-11-22 DIAGNOSIS — Z6835 Body mass index (BMI) 35.0-35.9, adult: Secondary | ICD-10-CM

## 2023-11-22 MED ORDER — PREDNISONE 20 MG PO TABS: 40.0000 mg | ORAL_TABLET | Freq: Every day | ORAL | 0 refills | Status: AC

## 2023-11-22 MED ORDER — AZITHROMYCIN 250 MG PO TABS: ORAL_TABLET | ORAL | 0 refills | Status: AC

## 2023-11-22 NOTE — Progress Notes (Signed)
 Ph: [phone] Fax: [phone]   Patient ID: Lori Orozco, female    DOB: Dec 14, 1950, 73 y.o.   MRN: [id number]  Virtual visit completed through MyChart, a video enabled telemedicine application. Due to national recommendations of social distancing due to COVID-19, a virtual visit is felt to be most appropriate for this patient at this time. Reviewed limitations, risks, security and privacy concerns of performing a virtual visit and the availability of in person appointments. I also reviewed that there may be a patient responsible charge related to this service. The patient agreed to proceed.   Patient location: home Provider location: [place] at [facility], office Persons participating in this virtual visit: patient, provider   If any vitals were documented, they were collected by patient at home unless specified below.    Wt 221 lb (100.2 kg)   BMI 35.14 kg/m   Patient does not have access to her BP cuff or thermometer currently.   CC: cough, congestion Subjective:   HPI: Lori Orozco is a 73 y.o. female presenting on 11/22/2023 for Medical Management of Chronic Issues (Pt C/o coughing, body aches. Congestion. /Started 1 week ago. )   Recently moved in with her sister.   First day of symptoms: 1.5 wks ago  Current symptoms: chest tightness /pressure associated with dry cough and coughing fits. Self treated with tessalon  perls 200mg  with significant benefit. She has also been using her Breztri  inhaler twice daily - but has more difficulty using due to recent R hand surgery. Body aches, developing hoarseness. 2d ago developed cough productive of colored sputum. Chest > head congestion. Some wheezing noted by sister.  Has not done COVID test but has this at home. Increased dyspnea, cough with sputum production.  No: fevers/chills, nausea, abd pain, diarrhea, ST, PNDrainage, no new leg swelling or orthopnea.  No sick contacts at home.  Treatments to date: tessalon   perls and Breztri  inhaler.  Risk factors include: rheumatoid arthritis on Actemra  injections and prednisone  10mg  daily. She just had RA surgery to R hand (08/2023). She is also on eliquis  for for h/o PE, CKD, HTN, COPD.   H/o pulmonary embolism with double pneumonia back in March 2025. States she's prone to infection due to RA meds.   She states doxycycline  usually doesn't work. Azithromycin  works better.   COVID vaccination status: x at least 6       Relevant past medical, surgical, family and social history reviewed and updated as indicated. Interim medical history since our last visit reviewed. Allergies and medications reviewed and updated. Outpatient Medications Prior to Visit  Medication Sig Dispense Refill   acetaminophen  (TYLENOL ) 500 MG tablet Take 500 mg by mouth every 6 (six) hours as needed.     alendronate  (FOSAMAX ) 70 MG tablet TAKE 1 TABLET BY MOUTH ONCE A WEEK. TAKE WITH A FULL GLASS OF WATER  ON AN EMPTY STOMACH. 12 tablet 0   apixaban  (ELIQUIS ) 2.5 MG TABS tablet Take 1 tablet (2.5 mg total) by mouth 2 (two) times daily. 180 tablet 3   Black Cohosh  40 MG CAPS Take 1 capsule (40 mg total) by mouth daily. 30 capsule 11   budesonide -glycopyrrolate -formoterol  (BREZTRI  AEROSPHERE) 160-9-4.8 MCG/ACT AERO inhaler Inhale 2 puffs into the lungs 2 (two) times daily. 3 each 4   buPROPion  (WELLBUTRIN  XL) 150 MG 24 hr tablet Take 150 mg by mouth daily.     busPIRone  (BUSPAR ) 15 MG tablet Take 15 mg by mouth 2 (two) times daily before a meal.  carvedilol  (COREG ) 3.125 MG tablet TAKE 1 TABLET BY MOUTH TWICE A DAY WITH A MEAL 180 tablet 2   cholecalciferol  (VITAMIN D ) 25 MCG (1000 UNIT) tablet Take 2,000 Units by mouth daily. 2 tablets daily     clobetasol  cream (TEMOVATE ) 0.05 % APPLY SMALL AMOUNT TO AFFECTED AREAS TWO TO THREE TIMES PER WEEK 30 g 0   cyanocobalamin  2000 MCG tablet Take 500 mcg by mouth daily.     diphenhydrAMINE  (BENADRYL ) 25 MG tablet Take 25 mg by mouth every 6 (six)  hours as needed for allergies.     divalproex  (DEPAKOTE  ER) 500 MG 24 hr tablet Take 500 mg by mouth at bedtime.      famotidine  (PEPCID ) 20 MG tablet Take 1 tablet (20 mg total) by mouth daily. 90 tablet 1   fluticasone  (FLONASE ) 50 MCG/ACT nasal spray PLACE 2 SPRAYS INTO THE NOSE DAILY. 16 g 11   HYDROcodone -acetaminophen  (NORCO/VICODIN) 5-325 MG tablet Take 1 tablet by mouth daily as needed for severe pain (pain score 7-10). 30 tablet 0   hydrocortisone cream 1 % Apply 1 Application topically 2 (two) times daily as needed for itching.     ipratropium-albuterol  (DUONEB) 0.5-2.5 (3) MG/3ML SOLN Take 3 mLs by nebulization 2 (two) times daily. (Patient taking differently: Take 3 mLs by nebulization 2 (two) times daily. As needed)     ketotifen  (ZADITOR ) 0.025 % ophthalmic solution Place 1 drop into both eyes 2 (two) times daily as needed (allergies).     levothyroxine  (SYNTHROID ) 50 MCG tablet Take 1 tablet (50 mcg total) by mouth daily before breakfast. 90 tablet 3   LORazepam  (ATIVAN ) 1 MG tablet Take 1 tablet (1 mg total) by mouth at bedtime as needed for anxiety or sleep. 7 tablet 0   mirabegron  ER (MYRBETRIQ ) 25 MG TB24 tablet TAKE 1 TABLET BY MOUTH TWICE A DAY 180 tablet 1   mirtazapine  (REMERON ) 30 MG tablet Take 30 mg by mouth at bedtime.      ondansetron  (ZOFRAN -ODT) 4 MG disintegrating tablet Take 1 tablet (4 mg total) by mouth every 8 (eight) hours as needed for nausea or vomiting. Caution of sedation 20 tablet 0   oxyCODONE  (OXY IR/ROXICODONE ) 5 MG immediate release tablet Take 10 mg by mouth every 4 (four) hours as needed.     oxyCODONE -acetaminophen  (PERCOCET/ROXICET) 5-325 MG tablet Take 1 tablet by mouth every 6 (six) hours as needed.     predniSONE  (DELTASONE ) 10 MG tablet Take 1 tablet (10 mg total) by mouth daily with breakfast. 90 tablet 0   Rhubarb (ESTROVEN COMPLETE) 4 MG TABS Take 4 mg by mouth daily. 30 tablet 11   sertraline  (ZOLOFT ) 100 MG tablet Take 100 mg by mouth daily.       simvastatin  (ZOCOR ) 20 MG tablet TAKE 1 TABLET BY MOUTH AT BEDTIME 90 tablet 0   Spacer/Aero-Holding Chambers (AEROCHAMBER PLUS) inhaler Use with inhaler 1 each 2   Tocilizumab  (ACTEMRA  ACTPEN) 162 MG/0.9ML SOAJ Inject 162 mg into the skin every 7 (seven) days. 10.8 mL 0   No facility-administered medications prior to visit.     Per HPI unless specifically indicated in ROS section below Review of Systems Objective:  Wt 221 lb (100.2 kg)   BMI 35.14 kg/m   Wt Readings from Last 3 Encounters:  11/22/23 221 lb (100.2 kg)  11/11/23 221 lb 6.4 oz (100.4 kg)  08/29/23 227 lb 8 oz (103.2 kg)       Physical exam: Gen: alert, NAD, not ill  appearing Pulm: speaks in complete sentences without increased work of breathing, no significant cough during virtual visit  Psych: normal mood, normal thought content      Results for orders placed or performed in visit on 11/11/23  Sedimentation rate   Collection Time: 11/11/23  1:59 PM  Result Value Ref Range   Sed Rate 6 0 - 30 mm/h  CBC with Differential/Platelet   Collection Time: 11/11/23  1:59 PM  Result Value Ref Range   WBC 6.8 3.8 - 10.8 Thousand/uL   RBC 4.02 3.80 - 5.10 Million/uL   Hemoglobin 11.8 11.7 - 15.5 g/dL   HCT 61.1 64.9 - 54.9 %   MCV 96.5 80.0 - 100.0 fL   MCH 29.4 27.0 - 33.0 pg   MCHC 30.4 (L) 32.0 - 36.0 g/dL   RDW 85.2 88.9 - 84.9 %   Platelets 203 140 - 400 Thousand/uL   MPV 10.4 7.5 - 12.5 fL   Neutro Abs 3,169 1,500 - 7,800 cells/uL   Absolute Lymphocytes 3,026 850 - 3,900 cells/uL   Absolute Monocytes 524 200 - 950 cells/uL   Eosinophils Absolute 61 15 - 500 cells/uL   Basophils Absolute 20 0 - 200 cells/uL   Neutrophils Relative % 46.6 %   Total Lymphocyte 44.5 %   Monocytes Relative 7.7 %   Eosinophils Relative 0.9 %   Basophils Relative 0.3 %  Comprehensive metabolic panel with GFR   Collection Time: 11/11/23  1:59 PM  Result Value Ref Range   Glucose, Bld 84 65 - 99 mg/dL   BUN 26 (H) 7 - 25  mg/dL   Creat 8.87 (H) 9.39 - 1.00 mg/dL   eGFR 52 (L) > OR = 60 mL/min/1.99m2   BUN/Creatinine Ratio 23 (H) 6 - 22 (calc)   Sodium 144 135 - 146 mmol/L   Potassium 4.7 3.5 - 5.3 mmol/L   Chloride 99 98 - 110 mmol/L   CO2 30 20 - 32 mmol/L   Calcium 9.2 8.6 - 10.4 mg/dL   Total Protein 6.0 (L) 6.1 - 8.1 g/dL   Albumin 4.1 3.6 - 5.1 g/dL   Globulin 1.9 1.9 - 3.7 g/dL (calc)   AG Ratio 2.2 1.0 - 2.5 (calc)   Total Bilirubin 0.7 0.2 - 1.2 mg/dL   Alkaline phosphatase (APISO) 38 37 - 153 U/L   AST 15 10 - 35 U/L   ALT 12 6 - 29 U/L   *Note: Due to a large number of results and/or encounters for the requested time period, some results have not been displayed. A complete set of results can be found in Results Review.   Assessment & Plan:   COPD exacerbation (HCC) Assessment & Plan: She notes increased dyspnea, increased cough and increased sputum production, anticipate current COPD exacerbation. Not consistent with PNA given she does not feel feverish, and she clinically appears well on limited video evaluation.  I did ask her to start taking Duoneb treatments TID PRN dyspnea, wheeze. I also asked her to COVID swab and report results.  Will treat for COPD exacerbation with azithromycin  course and oral prednisone  40mg  daily 5d burst as she states doxycycline  usually isn't effective - she has had better results previously with zpack.  Red flags to seek further care reviewed including fever >101, progressive dyspnea, persistent chest pressure, worsening wheezing or trouble catching her breath.  Continue other meds including eliquis .    CKD stage 3b, GFR 30-44 ml/min (HCC)  COPD mixed type (HCC)  History of pulmonary  embolism  Seronegative rheumatoid arthritis (HCC)  Class 2 severe obesity due to excess calories with serious comorbidity and body mass index (BMI) of 35.0 to 35.9 in adult  Other orders -     Azithromycin ; Take 2 tablets on day 1, then 1 tablet daily on days 2 through 5   Dispense: 6 tablet; Refill: 0 -     predniSONE ; Take 2 tablets (40 mg total) by mouth daily with breakfast for 5 days.  Dispense: 10 tablet; Refill: 0     I discussed the assessment and treatment plan with the patient. The patient was provided an opportunity to ask questions and all were answered. The patient agreed with the plan and demonstrated an understanding of the instructions. The patient was advised to call back or seek an in-person evaluation if the symptoms worsen or if the condition fails to improve as anticipated.  Follow up plan: No follow-ups on file.  Anton Blas, MD

## 2023-11-22 NOTE — Assessment & Plan Note (Addendum)
 She notes increased dyspnea, increased cough and increased sputum production, anticipate current COPD exacerbation. Not consistent with PNA given she does not feel feverish, and she clinically appears well on limited video evaluation.  I did ask her to start taking Duoneb treatments TID PRN dyspnea, wheeze. I also asked her to COVID swab and report results.  Will treat for COPD exacerbation with azithromycin  course and oral prednisone  40mg  daily 5d burst as she states doxycycline  usually isn't effective - she has had better results previously with zpack.  Red flags to seek further care reviewed including fever >101, progressive dyspnea, persistent chest pressure, worsening wheezing or trouble catching her breath.  Continue other meds including eliquis .

## 2023-11-24 ENCOUNTER — Other Ambulatory Visit: Payer: Self-pay | Admitting: Internal Medicine

## 2023-11-24 DIAGNOSIS — M0600 Rheumatoid arthritis without rheumatoid factor, unspecified site: Secondary | ICD-10-CM

## 2023-11-25 ENCOUNTER — Other Ambulatory Visit: Payer: Self-pay | Admitting: *Deleted

## 2023-11-25 ENCOUNTER — Telehealth: Payer: Self-pay

## 2023-11-25 ENCOUNTER — Other Ambulatory Visit: Payer: Self-pay

## 2023-11-25 ENCOUNTER — Other Ambulatory Visit: Payer: Self-pay | Admitting: Pharmacy Technician

## 2023-11-25 ENCOUNTER — Encounter (INDEPENDENT_AMBULATORY_CARE_PROVIDER_SITE_OTHER): Payer: Self-pay

## 2023-11-25 NOTE — Progress Notes (Signed)
 Specialty Pharmacy Refill Coordination Note  Lori Orozco is a 73 y.o. female contacted today regarding refills of specialty medication(s) Tocilizumab  (Actemra  ACTPen)   Patient requested (Patient-Rptd) Delivery   Delivery date: 11/28/23 Verified address: (Patient-Rptd) [address] [place] [address]   Medication will be filled on 11/27/23.

## 2023-11-25 NOTE — Patient Instructions (Signed)
 Visit Information  Thank you for taking time to visit with me today. Please don't hesitate to contact me if I can be of assistance to you before our next scheduled appointment.  Your next care management appointment is no further scheduled appointments.   Closing From: Clinical Social Work  Please call the care guide team at [phone] if you need to cancel, schedule, or reschedule an appointment.   Please call the Suicide and Crisis Lifeline: 988 call the [place]  National Suicide Prevention Lifeline: 226-222-5394 or TTY: 2125369449 TTY (772)250-3937) to talk to a trained counselor call 1-800-273-TALK (toll free, 24 hour hotline) call 911 if you are experiencing a Mental Health or Behavioral Health Crisis or need someone to talk to.  Edmonia Gonser, LCSW [place]  [facility], [facility] Licensed Clinical Social Worker  Direct Dial: [phone]

## 2023-11-25 NOTE — Patient Outreach (Signed)
 Complex Care Management   Visit Note  11/25/2023  Name:  Lori Orozco MRN: [id number] DOB: 1950-09-28  Situation: Referral received for Complex Care Management related to Mental/Behavioral Health diagnosis depression and anxiety I obtained verbal consent from Patient. Visit completed with patient on the phone     Background:   Past Medical History:  Diagnosis Date   Allergy Hayfever   1958   Amaurosis fugax    Anemia    hx   Anxiety    Arthritis    Asthma    Blood transfusion without reported diagnosis    Cataract    Chronic fatigue syndrome    Chronic kidney disease    frequency, nephritis when 73 yrs old   COPD (chronic obstructive pulmonary disease) (HCC)    COVID-19    Depression    Diverticulitis    Emphysema of lung (HCC)    Fibromyalgia    GERD (gastroesophageal reflux disease)    occ   H/O hiatal hernia    History of bronchitis    Hyperlipidemia    Hypothyroidism    Interstitial cystitis    Irritable bowel syndrome    Lichen sclerosus    Lumbar herniated disc    Migraines    Motor nerve conduction block 11/2021   Osteoporosis 09/10/2022   Pneumonia    PONV (postoperative nausea and vomiting)    Shortness of breath    exertion    Thyroid  disease    Graves   Urinary frequency    Urinary tract infection    Vertigo     Assessment: Patient Reported Symptoms:  Cognitive Cognitive Status: Alert and oriented to person, place, and time, Normal speech and language skills, Poor judgment in daily scenarios Cognitive/Intellectual Conditions Management [RPT]: None reported or documented in medical history or problem list   Health Maintenance Behaviors: Annual physical exam Healing Pattern: Average  Neurological Neurological Review of Symptoms: Weakness Oher Neurological Symptoms/Conditions [RPT]: fibromyaligia-weakness no change Neurological Management Strategies: Routine screening, Medication therapy  HEENT HEENT Symptoms Reported: No symptoms  reported HEENT Management Strategies: Routine screening HEENT Comment: Sees eye doctor every 6 months-dry eye- cannot see well out of right eye-being treated for graves disease Vision problem(s)  Cardiovascular      Respiratory Respiratory Symptoms Reported: Productive cough, Chest tightness Additional Respiratory Details: seen by provider, placed on antibiotic and predisone-feeling much better    Endocrine Endocrine Symptoms Reported: No symptoms reported    Gastrointestinal Gastrointestinal Symptoms Reported: No symptoms reported      Genitourinary Genitourinary Symptoms Reported: Incontinence Additional Genitourinary Details: Mybetiq has heled alot with urinary issues Genitourinary Management Strategies: Incontinence garment/pad  Integumentary Integumentary Symptoms Reported: No symptoms reported    Musculoskeletal Musculoskelatal Symptoms Reviewed: Back pain, Difficulty walking, Limited mobility, Muscle pain        Psychosocial Psychosocial Symptoms Reported: Anxiety - if selected complete GAD, Depression - if selected complete PHQ 2-9 Additional Psychological Details: Anxiety and depression symptoms overall has not changed -hoping that things will change with strart of therapy Behavioral Management Strategies: Medication therapy, Adequate rest Major Change/Loss/Stressor/Fears (CP): Medical condition, self Behaviors When Feeling Stressed/Fearful: focuses on other things to make myself feel better Techniques to Cope with Loss/Stress/Change: Counseling, Medication Quality of Family Relationships: helpful, supportive, involved Do you feel physically threatened by others?: No    11/25/2023    PHQ2-9 Depression Screening   Little interest or pleasure in doing things    Feeling down, depressed, or hopeless    PHQ-2 - Total  Score    Trouble falling or staying asleep, or sleeping too much    Feeling tired or having little energy    Poor appetite or overeating     Feeling bad about  yourself - or that you are a failure or have let yourself or your family down    Trouble concentrating on things, such as reading the newspaper or watching television    Moving or speaking so slowly that other people could have noticed.  Or the opposite - being so fidgety or restless that you have been moving around a lot more than usual    Thoughts that you would be better off dead, or hurting yourself in some way    PHQ2-9 Total Score    If you checked off any problems, how difficult have these problems made it for you to do your work, take care of things at home, or get along with other people    Depression Interventions/Treatment      There were no vitals filed for this visit.  Medications Reviewed Today     Reviewed by Ermalinda Lenn HERO, LCSW (Social Worker) on 11/25/23 at 1109  Med List Status: <None>   Medication Order Taking? Sig Documenting Provider Last Dose Status Informant  acetaminophen  (TYLENOL ) 500 MG tablet [id number]  Take 500 mg by mouth every 6 (six) hours as needed. [provider]  Active Family Member, Pharmacy Records           Med Note LEOBARDO, NICOLE   Sun May 05, 2023 12:44 AM) Last dose unknown  alendronate  (FOSAMAX ) 70 MG tablet [id number]  TAKE 1 TABLET BY MOUTH ONCE A WEEK. TAKE WITH A FULL GLASS OF WATER  ON AN EMPTY STOMACH. Jeannetta Lonni ORN, MD  Active   apixaban  (ELIQUIS ) 2.5 MG TABS tablet [id number]  Take 1 tablet (2.5 mg total) by mouth 2 (two) times daily. Tower, Laine LABOR, MD  Active   azithromycin  (ZITHROMAX ) 250 MG tablet [id number]  Take 2 tablets on day 1, then 1 tablet daily on days 2 through 5 Rilla Baller, MD  Active   Black Cohosh  40 MG CAPS [id number]  Take 1 capsule (40 mg total) by mouth daily. Constant, Peggy, MD  Active Family Member, Pharmacy Records  budesonide -glycopyrrolate -formoterol  (BREZTRI  AEROSPHERE) 160-9-4.8 MCG/ACT AERO inhaler [id number]  Inhale 2 puffs into the lungs 2 (two) times daily. Tower, Laine LABOR, MD  Active    buPROPion  (WELLBUTRIN  XL) 150 MG 24 hr tablet [id number]  Take 150 mg by mouth daily. [provider]  Active Family Member, Pharmacy Records  busPIRone  (BUSPAR ) 15 MG tablet [id number]  Take 15 mg by mouth 2 (two) times daily before a meal. [provider]  Active Family Member, Pharmacy Records  carvedilol  (COREG ) 3.125 MG tablet [id number]  TAKE 1 TABLET BY MOUTH TWICE A DAY WITH A MEAL Tower, Marne A, MD  Active   cholecalciferol  (VITAMIN D ) 25 MCG (1000 UNIT) tablet [id number]  Take 2,000 Units by mouth daily. 2 tablets daily [provider]  Active Family Member, Pharmacy Records  clobetasol  cream (TEMOVATE ) 0.05 % [id number]  APPLY SMALL AMOUNT TO AFFECTED AREAS TWO TO THREE TIMES PER WEEK Tower, Laine LABOR, MD  Active Family Member, Pharmacy Records           Med Note DORI ARVIN FORBES Pablo Jul 29, 2023  3:34 PM) Patient states she  uses as needed.   cyanocobalamin  2000 MCG tablet [id number]  Take 500 mcg by mouth daily. [provider]  Active Family Member, Pharmacy Records  diphenhydrAMINE  (BENADRYL ) 25 MG tablet [id number]  Take 25 mg by mouth every 6 (six) hours as needed for allergies. [provider]  Active Family Member, Pharmacy Records  divalproex  (DEPAKOTE  ER) 500 MG 24 hr tablet [id number]  Take 500 mg by mouth at bedtime.  [provider]  Active Family Member, Pharmacy Records  famotidine  (PEPCID ) 20 MG tablet [id number]  Take 1 tablet (20 mg total) by mouth daily. Tower, Laine LABOR, MD  Active   fluticasone  (FLONASE ) 50 MCG/ACT nasal spray [id number]  PLACE 2 SPRAYS INTO THE NOSE DAILY. Neysa Reggy BIRCH, MD  Active Family Member, Pharmacy Records  HYDROcodone -acetaminophen  (NORCO/VICODIN) 5-325 MG tablet [id number]  Take 1 tablet by mouth daily as needed for severe pain (pain score 7-10). Jeannetta Lonni ORN, MD  Active   hydrocortisone cream 1 % [id number]  Apply 1 Application topically 2 (two) times daily as needed for itching. [provider]  Active Family Member, Pharmacy Records           Med Note LEOBARDO, NICOLE   Sun May 05, 2023 12:24 AM) Last dose unknown  ipratropium-albuterol  (DUONEB) 0.5-2.5 (3) MG/3ML SOLN [id number]  Take 3 mLs by nebulization 2 (two) times daily.  Patient taking differently: Take 3 mLs by nebulization 2 (two) times daily. As needed   Amin, Burgess BROCKS, MD  Active   ketotifen  (ZADITOR ) 0.025 % ophthalmic solution [id number]  Place 1 drop into both eyes 2 (two) times daily as needed (allergies). [provider]  Active Family Member, Pharmacy Records  levothyroxine  (SYNTHROID ) 50 MCG tablet [id number]  Take 1 tablet (50 mcg total) by mouth daily before breakfast. Tower, Laine LABOR, MD  Active   LORazepam  (ATIVAN ) 1 MG tablet [id number]  Take 1 tablet (1 mg total) by mouth at bedtime as needed for anxiety or sleep. Caleen Burgess BROCKS, MD  Active   mirabegron  ER (MYRBETRIQ ) 25 MG TB24 tablet [id number]  TAKE 1 TABLET BY MOUTH TWICE A DAY Tower, Marne A, MD  Active   mirtazapine  (REMERON ) 30 MG tablet [id number]  Take 30 mg by mouth at bedtime.  [provider]  Active Family Member, Pharmacy Records           Med Note MAYER, ARLYSS RAMAN   Fri Apr 04, 2018  8:11 AM)    ondansetron  (ZOFRAN -ODT) 4 MG disintegrating tablet [id number]  Take 1 tablet (4 mg total) by mouth every 8 (eight) hours as needed for nausea or vomiting. Caution of sedation Tower, Laine LABOR, MD  Active   oxyCODONE  (OXY IR/ROXICODONE ) 5 MG immediate release tablet [id number]  Take 10 mg by mouth every 4 (four) hours as needed. [provider]  Active   oxyCODONE -acetaminophen  (PERCOCET/ROXICET) 5-325 MG tablet [id number]  Take 1 tablet by mouth every 6 (six) hours as needed. [provider]  Active   predniSONE  (DELTASONE ) 10 MG tablet [id number]  Take 1 tablet (10 mg total) by mouth daily with breakfast. Jeannetta Lonni ORN, MD  Active   predniSONE  (DELTASONE ) 20 MG tablet [id number]  Take 2 tablets (40 mg total) by mouth  daily with breakfast for 5 days. Rilla Baller, MD  Active   Rhubarb (ESTROVEN COMPLETE) 4 MG TABS [id number]  Take 4 mg by mouth daily. Constant, Peggy, MD  Active Family Member, Pharmacy Records  sertraline  (ZOLOFT ) 100 MG tablet [id number]  Take 100 mg by mouth daily.  [provider]  Active Family Member, Pharmacy Records  Med Note MAYER ARLYSS RAMAN   Fri Apr 04, 2018  8:12 AM)    simvastatin  (ZOCOR ) 20 MG tablet [id number]  TAKE 1 TABLET BY MOUTH AT BEDTIME Tower, Laine LABOR, MD  Active   Spacer/Aero-Holding Chambers (AEROCHAMBER PLUS) inhaler [id number]  Use with inhaler Van Knee, MD  Active Family Member, Pharmacy Records  Tocilizumab  (ACTEMRA  ACTPEN) 162 MG/0.9ML EMMANUEL [id number]  Inject 162 mg into the skin every 7 (seven) days. Jeannetta Lonni ORN, MD  Active             Recommendation:   PCP Follow-up Specialty provider follow-up as scheduled Ongoing follow up with [facility]  Follow Up Plan:   Closing From:  Clinical social work at this time  [employer], [employer], [employer] [facility] Licensed Clinical Social Worker  Direct Dial: [phone]

## 2023-11-25 NOTE — Telephone Encounter (Signed)
 Last Fill: 07/30/2023  Next Visit: 02/11/2024  Last Visit: 11/11/2023  Dx: Seronegative rheumatoid arthritis   Current Dose per office note on 11/11/2023: prednisone  10 mg daily for now, aim to taper after 3mos steady on actemra    Okay to refill Prednisone ?

## 2023-11-27 ENCOUNTER — Other Ambulatory Visit: Payer: Self-pay

## 2023-12-03 ENCOUNTER — Other Ambulatory Visit: Payer: Self-pay

## 2023-12-03 NOTE — Patient Instructions (Signed)
 Visit Information  Thank you for taking time to visit with me today. Please don't hesitate to contact me if I can be of assistance to you before our next scheduled appointment.  Your next care management appointment is : NO FURTHER APPOINTMENTS AS DISCUSSED.  Please notify your PCP or call me if you would liek to resume RN Care Manager visits. Thank you.   Closing From: Complex Care Management.  Please call the care guide team at [phone] if you need to cancel, schedule, or reschedule an appointment.   Please call the Suicide and Crisis Lifeline: 988 call the [place]  National Suicide Prevention Lifeline: 226-146-7145 or TTY: 316 784 4908 TTY 581-018-2359) to talk to a trained counselor call 1-800-273-TALK (toll free, 24 hour hotline) go to [facility] [address], [place] [phone]) call 911 if you are experiencing a Mental Health or Behavioral Health Crisis or need someone to talk to.  Nestora Duos, MSN, RN [facility], [facility] RN Care Manager Direct Dial: [phone] Fax: [phone]

## 2023-12-18 ENCOUNTER — Encounter (INDEPENDENT_AMBULATORY_CARE_PROVIDER_SITE_OTHER): Payer: Self-pay

## 2023-12-18 NOTE — Progress Notes (Signed)
 Subjective:    Patient ID: Lori Orozco, female    DOB: 1950-03-26, 73 y.o.   MRN: [id number]  HPI  female former cigarette/ VAPE smoker followed for COPD, complicated by bipolar, GERD, Hiatal Hernia, Hypothyroid, Peripheral Neuropathy, Covid infection/MAB infusion Jan, 2021, DVT/PE, Rheumatoid Arthritis/ Enbrel ,  PFT 10/09/10- Office spirometry Moderate obstruction and restriction of exhaled volume Quantiferon TB Gold- 11/07/22- NEG -----------------------------------------------------------------------------   12/20/22- 73 year old female former smoker (80 pkyrs) followed for COPD,  Lung Nodules,,hx nicotine use, Allergic Rhinitis, complicated by Bipolar disorder, Hiatal Hernia, Hypothyroid, Peripheral Neuropathy, Covid infection/MAB infusion Jan, 2021,  -Flonase , Proair  hfa, neb, Duoneb, Breztri ,  Prednisone  10 mg daily,  Covid vax- 3 Moderna, 1 Phizer ACT score 21 Had flu vax She feels she is breathing well with no recent exacerbations.  Has had flu and COVID vaccines this fall.  Denies discolored sputum, wheezing, chest pain or palpitation. Hx of lung nodules- will update CXR. She has fallen recently and actually found her back pain was bothering her less so she says she intends to defer back surgery.  She is also considering surgery for arthritis of right hand, with no timing established. Risk assessment-currently stable without acute problems.  I do not identify problems that must be immediately addressed before anticipated surgery.  She would be at some increased risk for surgery related complications involving respiration and possibly cardiac status.  Will consider full pulmonary function testing on return.  12/18/23- 73 year old female former smoker (80 pkyrs) followed for COPD,  Lung Nodules,,hx nicotine use, Allergic Rhinitis, complicated by Bipolar disorder, Hiatal Hernia, Hypothyroid, Peripheral Neuropathy, Covid infection/MAB infusion Jan, 2021, Hx PE,  -Flonase , Proair   hfa, neb, Duoneb, Breztri ,  Prednisone  10 mg daily, Eliquis  2.5 bid, Hosp in May after a fall with back pain, small PE, acute hypoxic respiratory failure/ exacerbation of COPD. Easier DOE with sustained walking. Discussed the use of AI scribe software for clinical note transcription with the patient, who gave verbal consent to proceed.  History of Present Illness   Lori Orozco is a 73 year old female with rheumatoid arthritis and a history of blood clots who presents with shortness of breath.  She experiences shortness of breath but notes no new or worsening symptoms. She feels unwell and fatigued, requiring rest at times. Her breathing is stable without cough or sputum production. She uses a rolling walker and does not require home oxygen. Her respiratory medications include a nebulizer, albuterol  inhaler, and Breztri . She fell with back injury in early summer. Antibiotic from PCP for bronchitis a few weeks ago, but that resolved.  Her medical history includes blood clots, for which she is on Eliquis , and a past episode of pneumonia. She received pneumonia vaccines, most recently in 2019. Her rheumatoid arthritis and recent hand surgery are noted but not directly impacting her current respiratory status. After discussion, she wishes to update flu and pneumonia vaccines.     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 - Continue Breztri . - Ensure availability of nebulizer and albuterol  inhaler.  Pneumonia No current symptoms of pneumonia. - Administer Prevnar 20 vaccine. - Administer flu shot. - Advise taking Tylenol  and extra water  for a day post-vaccination.     CTaPE 05/05/23 MPRESSION: 1. Small segmental and subsegmental bilateral lower lobe pulmonary emboli. No right heart strain. 2. Bilateral lower lobe airspace consolidation, left greater than right, worrisome for pneumonia. 3. Mild  patchy multifocal ground-glass opacities in the  bilateral upper lobes, likely infectious/inflammatory. 4. Trace left pleural effusion. 5. Mediastinal and hilar lymphadenopathy, likely reactive. 6. Endotracheal tube tip is 2.6 cm above the carina. 7. Enteric tube tip is in the body of the stomach. 8. Chronic compression deformities of T2, T9, and T12.  ROS-see HPI   + = positive Constitutional:   No-   weight loss, night sweats, fevers, chills, fatigue, lassitude. HEENT:   No-  headaches, difficulty swallowing, tooth/dental problems, sore throat,       No-  sneezing, itching, ear ache, nasal congestion, post nasal drip,  CV:  No-   chest pain, orthopnea, PND, swelling in lower extremities, anasarca, dizziness, palpitations Resp: +shortness of breath with exertion or at rest.              No- productive cough,   non-productive cough,  No- coughing up of blood.               No-   change in color of mucus.  No- wheezing.   Skin: No-   rash or lesions. GI:  No-   heartburn, indigestion, abdominal pain, nausea, vomiting,  GU: MS:  + joint pain or swelling.  SABRA+ low back pain Neuro-     nothing unusual Psych:  No- change in mood or affect. depression or +anxiety.  No memory loss.  Objective:   Physical Exam  General- Alert, Oriented, Affect-appropriate, Distress-+moderate discomfort back pain.  +rolling walker, +obese Skin- rash-none, lesions- none, excoriation- none Lymphadenopathy- none Head- atraumatic            Eyes- Gross vision intact, PERRLA, conjunctivae clear secretions            Ears- Hearing, canals-normal            Nose- Clear, no-Septal dev, mucus, polyps, erosion, perforation             Throat- Mallampati III-IV , mucosa clear , drainage- none, tonsils- atrophic Neck- flexible , trachea midline, no stridor , thyroid  nl, carotid no bruit Chest - symmetrical excursion , unlabored           Heart/CV- RRR , no murmur , no gallop  , no rub, nl s1 s2                            - JVD- none , edema- none, stasis changes- none, varices- none           Lung- +clear/ diminished , unlabored, cough-none , dullness-none, rub- none           Chest wall-   Abd-  Br/ Gen/ Rectal- Not done, not indicated Extrem- +jiggling legs Neuro-

## 2023-12-19 ENCOUNTER — Ambulatory Visit (INDEPENDENT_AMBULATORY_CARE_PROVIDER_SITE_OTHER): Admitting: Internal Medicine

## 2023-12-19 ENCOUNTER — Other Ambulatory Visit: Payer: Self-pay

## 2023-12-19 ENCOUNTER — Encounter: Payer: Self-pay | Admitting: Internal Medicine

## 2023-12-19 VITALS — BP 126/64 | HR 80 | Temp 98.0°F | Ht 67.0 in | Wt 227.8 lb

## 2023-12-19 DIAGNOSIS — J449 Chronic obstructive pulmonary disease, unspecified: Secondary | ICD-10-CM

## 2023-12-19 DIAGNOSIS — Z23 Encounter for immunization: Secondary | ICD-10-CM | POA: Diagnosis not present

## 2023-12-19 NOTE — Progress Notes (Signed)
 Specialty Pharmacy Refill Coordination Note  Lori Orozco is a 73 y.o. female contacted today regarding refills of specialty medication(s) Tocilizumab  (Actemra  ACTPen)   Patient requested (Patient-Rptd) Delivery   Delivery date: 12/24/23   Verified address: (Patient-Rptd) [address] [place] [address]   Medication will be filled on 12/23/23.

## 2023-12-19 NOTE — Patient Instructions (Signed)
 Order- flu vax senior  Order- Prevnar 20 pneumococcal vaccine  Please let us  know if we can help

## 2023-12-20 ENCOUNTER — Telehealth: Payer: Self-pay | Admitting: Internal Medicine

## 2023-12-20 NOTE — Telephone Encounter (Signed)
 Patient called stated she would like a call back from Dr. Bernis office about her hand pain. Patient stated she may need prednisone .

## 2023-12-20 NOTE — Telephone Encounter (Signed)
 Contacted the patient and patient states she had just went to the orthopedic who did her hand surgery. Patient states her right hand and thumb joint have a lot of pain. Patient states her right hand and wrist are swollen. Patient states after xrays, she was advised it was her arthritis. Patient states she knows she cannot have an injection because she has a metal rod in her hand now. Patient states she was advised to contact our office for prednisone . Patient states she can barely use her right hand or right thumb. Patient states if anything can be sent in, she would like [facility] to [facility] to [facility] on [address]. Please advise.

## 2023-12-23 ENCOUNTER — Ambulatory Visit: Payer: Medicare Other | Admitting: Internal Medicine

## 2023-12-23 ENCOUNTER — Other Ambulatory Visit: Payer: Self-pay | Admitting: Family Medicine

## 2023-12-23 ENCOUNTER — Telehealth: Payer: Self-pay | Admitting: Rheumatology

## 2023-12-23 ENCOUNTER — Other Ambulatory Visit: Payer: Self-pay

## 2023-12-23 DIAGNOSIS — R921 Mammographic calcification found on diagnostic imaging of breast: Secondary | ICD-10-CM

## 2023-12-23 NOTE — Telephone Encounter (Signed)
 I received a phone call from patient on December 22, 2023 at 12:42 PM.  Patient reported having a flare of rheumatoid arthritis with increased pain and swelling in multiple joints.  She requested a prednisone  taper.  Side effects of prednisone  were discussed.  A prescription for prednisone  starting at 20 mg p.o. daily and taper by 5 mg every 4 days was sent.  Patient was advised to contact Dr. Jeannetta if her symptoms do not improve. Maya Nash, MD

## 2023-12-30 ENCOUNTER — Encounter: Payer: Self-pay | Admitting: Radiology

## 2024-01-07 ENCOUNTER — Telehealth: Payer: Self-pay

## 2024-01-07 NOTE — Telephone Encounter (Unsigned)
 Copied from CRM #[id number]. Topic: Clinical - Medication Question >> Jan 07, 2024  2:52 PM China J wrote: Reason for CRM: The patient is needing Manuelita to fill out paperwork on behalf of her Breztri  Aerosphere Inhaler since the government pays for the medication. The paperwork will ensure coverage for the full year of 2026.  The name of the company she goes through for this is [employer].  Please call the patient for further questions or needed clarification. She would also like to be notified when this is completed.

## 2024-01-10 ENCOUNTER — Telehealth: Payer: Self-pay

## 2024-01-10 ENCOUNTER — Other Ambulatory Visit: Payer: Self-pay | Admitting: Family Medicine

## 2024-01-10 NOTE — Telephone Encounter (Signed)
 Gave pt a call she call requesting imf on [employer] Breztri  re-enrollment,pt has been pre-approve will be calling [employer] to give consent to 2026 enrollment.if ptneeds father assistance pt  will call back.

## 2024-01-11 ENCOUNTER — Other Ambulatory Visit: Payer: Self-pay | Admitting: Internal Medicine

## 2024-01-11 DIAGNOSIS — M8000XS Age-related osteoporosis with current pathological fracture, unspecified site, sequela: Secondary | ICD-10-CM

## 2024-01-13 ENCOUNTER — Other Ambulatory Visit: Payer: Self-pay

## 2024-01-13 NOTE — Telephone Encounter (Signed)
 Last Fill: 10/21/2023  Labs: 11/11/2023 BUN 26, Creat. 1.12, GFR 52, BUN/Creat. Ratio 23  Next Visit: 02/11/2024  Last Visit: 11/11/2023  DX: not discussed  Current Dose per office note 11/11/2023: not discussed  Okay to refill Fosamax ?

## 2024-01-20 ENCOUNTER — Other Ambulatory Visit: Payer: Self-pay

## 2024-01-20 NOTE — Progress Notes (Signed)
 Specialty Pharmacy Refill Coordination Note  Lori Orozco is a 73 y.o. female contacted today regarding refills of specialty medication(s) Tocilizumab  (Actemra  ACTPen)   Patient requested Delivery   Delivery date: 01/29/24   Verified address: [address] [place] [address]   Medication will be filled on: 01/28/24

## 2024-01-28 ENCOUNTER — Other Ambulatory Visit: Payer: Self-pay

## 2024-02-02 ENCOUNTER — Other Ambulatory Visit: Payer: Self-pay | Admitting: Family Medicine

## 2024-02-03 ENCOUNTER — Telehealth: Payer: Self-pay | Admitting: Physical Medicine and Rehabilitation

## 2024-02-03 NOTE — Progress Notes (Signed)
 "  Office Visit Note  Patient: Lori Orozco             Date of Birth: 11/09/1950           MRN: [id number]             PCP: Randeen Laine LABOR, MD Referring: Tower, Laine LABOR, MD Visit Date: 02/11/2024   Subjective:  Medication Management (Patient is asking for a increase in prednisone  dose as she is still in severe pain an says she is tired of crying due to pain. )   Discussed the use of AI scribe software for clinical note transcription with the patient, who gave verbal consent to proceed.  History of Present Illness   Lori Orozco is a 73 y.o. 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 She presents with severe pain and medication management issues.  She has been experiencing severe, unbearable pain for about a month, primarily in her thumb, radiating up her arm. She reports that her hand surgeon discussed her thumb and that she has not received an injection because of other medical reasons.  In October, she experienced a flare-up of symptoms and contacted the office but did not receive a timely response. Over the weekend, another doctor prescribed a step-down prednisone  regimen, which significantly alleviated her symptoms. She expresses frustration over the delay in communication and the pain endured during that time.  She is currently taking hydrocodone  sparingly due to concerns about addiction, typically taking half a pill at a time to manage her pain. Hydrocodone  causes insomnia, leading her to walk the floors instead of sleeping. She avoids taking it daily to prevent dependency.  She has a history of a herniated disc and arthritis, with compound fractures and a spinal implant that she feels is not effective. She also reports a new symptom of electric-like pain starting from her elbow and affecting three fingers, which she suspects might be a pinched nerve.  Her sleep has improved after adjusting her mirtazapine  dosage, but she still wakes up  early and struggles to fall back asleep. She is not currently taking Ativan  to avoid potential dementia-related side effects.  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 She has tried exercises to alleviate soreness but finds them painful.       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 Currently she complains of increased pain in her left elbow and left wrist.   She underwent surgery on her right hand, which included wrist fusion and MCP joint replacement. There is no pain in the right hand post-surgery, although she is unable to make a tight fist yet. She is currently undergoing occupational therapy and is exercising to improve hand function.   She experiences significant pain in her left elbow, describing it as severe and stating, 'I can't even touch the bone.' She has had previous surgery on the underside of the elbow at the medial epicondyle but this is located in a different area.   Additionally, she experiences pain in her left thumb, particularly in the muscle and extending down into the inside of the thumb. She notes that the rest of the hand feels bruised but not acutely painful.   She is currently on Actemra  injections and reports feeling 'pretty good' with no noticeable side effects. However, she has not observed  the same rapid improvement as she did with Enbrel , which she described as 'night and day.'     Previous HPI 07/30/2023 Lori Orozco is a 73 y.o. 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  She presents with worsening muscle and bone pain.   She has been experiencing worsening muscle and bone pain over the past few days, significantly impacting her mobility and sleep. She describes difficulty getting out of her chair due to weakness and pain, and states that the pain wakes her up  at night. Her muscles feel like they are 'grabbing' and causing significant discomfort, particularly in her groin area, requiring her to hold onto the bed to turn over. She took hydrocodone  last night to alleviate the pain.   Her symptoms have been worsening over the past week, despite a temporary improvement with a higher dose of prednisone . No fever, but she reports experiencing chills. She is currently taking 10 mg of prednisone . Her rheumatoid arthritis has been previously managed with Enbrel , which initially worked well, but now her symptoms have returned.   She reports ongoing issues with her hands, particularly her right hand, which is painful and less functional. The left hand is better than the right but still problematic. She has a history of shoulder replacement and a pinched nerve in her neck, which causes pain when lying back.   07/02/2023 Lori Orozco is a 73 y.o. 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  She presents with severe wrist pain and hand dysfunction.   She experiences severe wrist pain described as 'insane pain' that is not relieved by painkillers. The pain is exacerbated by movement and touch, making it difficult for her to push up from a chair. She has tried icing and applying heat without relief.   She describes dysfunction in her hand, noting an inability to straighten her fingers except for her thumb and two fingers. A history of wrist surgery previously alleviated her pain, but now arthritis has set in, causing significant discomfort and swelling. She is currently taking 10 mg of prednisone  but has not noticed any reduction in swelling or pain. She also mentions being on blood thinners and has previously used Enbrel  for her rheumatoid arthritis.   She recalls a previous hand surgeon, Dr. Norleen Cowing  who performed surgery on her hand, and another surgeon, Dr. Romona, who was hesitant to perform further  surgery due to the complexity of her condition.   She experiences difficulty using her hand for daily activities, such as eating, due to the pain and limited mobility. She reports experiencing fever-like symptoms but is unsure of their significance. No other areas of flare-up outside of her wrist.    12/24/2022 VERCIE POKORNY is a 73 y.o. 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  She also started on Fosamax  70 mg once weekly for osteoporosis without any trouble taking the medication.  She had 1 episode of increased stiffness and swelling throughout proximal joints in her right hand this lasted for about 3 days.  She did not change her medications or take anything different to resolve this.  Saw Dr. Romona to discuss options to improve use of her right hand is waiting to hear back on recommendation or other options.  She just had a fall fell forward  in the bathroom striking on her left shoulder and left side of the neck and base of skull.  Before this her last major fall was late last year has had about 2 or 3 other falls in total with suddenly losing balance.   09/10/22 MARQUESHA ROBIDEAU is a 73 y.o. female here for follow up for seronegative RA on Enbrel  50 mg [place] weekly and prednisone  10 mg daily. She has significant pain in her back and legs interval workup indicating nerve impingement and is scheduled for injections with Dr. Eldonna for this. MRI also with vertebral fractures probably chronic. In her arms right wrist remains painful and decreased mobility worst on the ulnar side.   05/30/22 KEYLEEN CERRATO is a 73 y.o. 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  She was taking the medications without interruptions but started experiencing increased joint pain swelling and stiffness especially affecting her hands wrists and elbows since  about 2 weeks ago.  She called about this and we increased her to 20 mg prednisone  daily up from 10 mg and she has noticed improvement in symptoms especially her elbows.  No longer swollen and is able to tolerate resting her arms on a flat surface without severe pain.  She did suffer from upper respiratory symptoms last week but these have mostly resolved without any new medication needed.    02/28/22 BRIN RUGGERIO is a 73 y.o. female here for follow up for seronegative rheumatoid arthritis on Enbrel  50 mg subcu weekly and prednisone  10 mg daily.  She had to stop taking the sulfasalazine  since about 2 weeks ago due to running out of the medication.  She had change with insurance status so having a dramatically increased cost for her medications at the moment that she is having to sort out.  She was tolerating the medicine okay.  She did not recall a noticeable difference in symptoms with starting the sulfasalazine  but while off the medication has felt noticeably worse so thinks it was doing something.  Particularly the left wrist pain has been a lot better on treatment her right wrist continues to be bad before and after.  She saw Dr. Onetha for her neck recommendation was for physical therapy but she has not pursued this since and does not feel like she would tolerate this well based on previous therapy experiences.  She does have plans for trying the nerve stimulator treatment starting later this month.    12/21/21 DEVINN VOSHELL is a 73 y.o. female here for follow up for seronegative inflammatory arthritis on Enbrel  50 mg subcu weekly.  She continues to have pain and stiffness affecting her hands bilaterally although the severity of swelling has gotten notably better on the Enbrel  treatment but is far from resolved.  Her biggest concern at the moment is about her neck pain concern of cervical radiculopathy.  We checked x-ray that showed considerable multilevel degenerative disease with no acute  appearing bony abnormality.  She is followed up with her spine doctor with epidural block injection tried did not find it very beneficial at all.  She has discussed radiofrequency ablation as a neck step option for radicular pain.   10/09/2021 CHLORA MCBAIN is a 73 y.o. female here for follow up for seronegative inflammatory arthritis on Enbrel  50 mg [place] weekly and prednisone  10 mg daily. She tried tapering prednisone  but had severe worsening symptoms at  5 mg dose. She increased back to 10 mg daily and symptoms improved somewhat but still a lot of pain and swelling in both hands and wrists. She has ongoing back pain and stiffness and is seeing D.r Onetha for this with known significant lumbar spine arthritis. More recently new problem with sensitivity and stinging type pain throughout both arms with even light pressure such as showering water . She also has worsening weakness in her proximal legs unable to stand without using arms, which are limited by pain.   07/05/2021 KEARIE MENNEN is a 73 y.o. female here for follow up for seronegative inflammatory arthritis on prednisone  20 mg daily dose and enbrel  50mg  subcutaneous once weekly. She has seen a large improvement in joint inflammation since starting the Enbrel . She has pain and redness around the injection site lasting up to 3 days with each dose. Wrists continue to hurt and have decreased movement left wrist actually more symptomatic than right now. She is working with physical therapy on getting back to walking currently very weak and unstable due to illness and deconditioning. She had recent ED visit for COPD exacerbation finished antibiotics for this. No other recent infection issues.   Previous HPI 02/08/2021 SEVA CHANCY is a 73 y.o. female here for follow up with seronegative inflammatory arthritis on prednisone  taper at 15 mg daily dose.  She has had major medical events since her last visit.  She developed increased knee pain and  swelling on the right side.  However she went to the hospital due to developing more weakness and shortness of breath CT angiogram identified bilateral pulmonary emboli.  Treatment with anticoagulation led to significant gastrointestinal bleeding from previously unknown AVM.  This was treated also in the hospital but overall protracted complications lasted for about a month hospitalization.  During this time she experienced worsening joint pain and swelling and significant deconditioning and weakness.  She was discharged from the hospital to rehab facility she was not continued on any steroid or other anti-inflammatory treatment reports joint pain and swelling affecting numerous joints in bilateral elbows wrists fingers and knees.  This is limited any significant progress with physical therapy so far.  She called back to clinic earlier this month with the symptoms and was restarted on a prednisone  taper currently down to 15 mg daily.  She saw orthopedics clinic for right knee aspiration injection 2 days ago that is partially helping.   11/15/20 TYTIONNA CLOYD is a 73 y.o. female here for follow up with wrist wrist extensor tenosynovitis and presumed seronegative inflammatory arthritis also involving knee joints after starting methotrexate  15 mg PO weekly and tapering prednisone  off. Her wrist is slightly improved but remains very painful and swollen with decreased mobility. After starting methotrexate  she has noticed some episodes of dizziness and urinary retention and frequency. She stopped the prednisone  without noticing much difference in symptoms so far.     09/20/20 SAHAANA WEITMAN is a 73 y.o. female here for joint pain and swelling of knees and wrists with severe tenosynovitis s/p tenosynovectomy on 09/01/20 with Dr. Vernetta.  She had been feeling in her usual health until June she recalls onset of symptoms after using a gas leaf blower at her home during which time she experienced some mild right  wrist pain.  She does not recall any particular injury event at this time.  However she experienced progressive ongoing increase in pain with swelling and stiffness in her right wrist especially with decreased range of motion  and pain over the dorsal aspect.  This is evaluated at orthopedics clinic with aspiration that revealed just coagulated sample trial of steroid medication and only partially improved symptoms then continued worsening.  She was admitted to the hospital for tenosynovectomy that was performed without complication.  Intraoperatively reported extensive inflammatory tissue debridement without any evidence concerning for infection.  During this hospitalization she also developed knee effusion which was aspirated demonstrating inflammatory synovial fluid with negative microscopy and cultures.  Is now almost 3 weeks since her surgery she continues experiencing severe pain intensity swelling around the right hand and wrist and has developed some skin blistering.     Labs reviewed 08/2020 Synovial fluid knee 4,525 WBCs 90% neutrophils ANA neg RF 14.2 CCP neg ESR 37 CRP 14.1 Uric acid 4.9   07/2020 Synovial fluid wrist clotted sample negative gram stain   Review of Systems  Constitutional:  Positive for fatigue.  HENT:  Positive for mouth dryness. Negative for mouth sores.   Eyes:  Positive for dryness.  Respiratory:  Positive for shortness of breath.   Cardiovascular:  Negative for chest pain and palpitations.  Gastrointestinal:  Negative for blood in stool, constipation and diarrhea.  Endocrine: Positive for increased urination.  Genitourinary:  Negative for involuntary urination.  Musculoskeletal:  Positive for joint pain, gait problem, joint pain, joint swelling, myalgias, muscle weakness, morning stiffness, muscle tenderness and myalgias.  Skin:  Positive for color change. Negative for rash, hair loss and sensitivity to sunlight.  Allergic/Immunologic: Negative for  susceptible to infections.  Neurological:  Positive for headaches. Negative for dizziness.  Hematological:  Negative for swollen glands.  Psychiatric/Behavioral:  Positive for depressed mood and sleep disturbance. The patient is nervous/anxious.     PMFS History:  Patient Active Problem List   Diagnosis Date Noted   Lateral epicondylitis, left elbow 11/11/2023   Tenosynovitis, de Quervain 11/11/2023   Positive colorectal cancer screening using Cologuard test 08/12/2023   Colon cancer screening 07/17/2023   Diastolic dysfunction 06/06/2023   Class 2 severe obesity due to excess calories with serious comorbidity and body mass index (BMI) of 35.0 to 35.9 in adult 06/06/2023   CKD stage 3b, GFR 30-44 ml/min (HCC) 05/05/2023   Elevated troponin 05/05/2023   Depression with anxiety 05/05/2023   Cerumen impaction 03/06/2023   Lesion of palate 03/06/2023   Mobility impaired 02/10/2023   Generalized weakness 02/08/2023   Recurrent falls 12/24/2022   Pre-operative clearance 10/30/2022   Diarrhea 10/19/2022   Cold intolerance 10/19/2022   Osteoporosis 09/10/2022   Thoracic compression fracture (HCC) 07/13/2022   Diabetes mellitus screening 07/13/2022   Lumbar herniated disc    Chronic pain 11/28/2021   Bilateral leg weakness 10/09/2021   Vitamin B12 deficiency 08/02/2021   Myofascial pain dysfunction syndrome 08/02/2021   Adverse effect of prednisone  07/11/2021   AVM (arteriovenous malformation) 01/03/2021   History of pulmonary embolism 12/08/2020   Arthritis of knee 12/06/2020   Bilateral knee pain 09/20/2020   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   High risk medication use 09/20/2020   Extensor tenosynovitis of right wrist 09/15/2020   S/P reverse total shoulder arthroplasty, 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   Lung nodule 01/02/2020   Status post arthroscopy of left shoulder 01/16/2018   Estrogen deficiency 06/28/2017   Medial epicondylitis, left elbow 04/29/2017   S/P arthroscopy  of left shoulder 02/25/2017   Impingement syndrome of left shoulder 01/30/2017   Trochanteric bursitis, right hip 04/10/2016   Hypertension 09/02/2015   Urge incontinence 07/27/2015  Obesity (BMI 30-39.9) 04/28/2015   Osteoarthritis of left knee 01/28/2015   Status post total left knee replacement 01/28/2015   Vitamin D  deficiency 04/27/2014   Seasonal and perennial allergic rhinitis 06/17/2013   S/P total hip arthroplasty 03/20/2013   Medicare annual wellness visit, subsequent 11/18/2012   At high risk for falls 11/18/2012   Routine general medical examination at a health care facility 11/10/2012   Lichen sclerosus et atrophicus of the vulva 07/29/2012   COPD exacerbation (HCC) 11/07/2011   Osteopenia 10/19/2011   Hypothyroidism 10/19/2011   Hereditary and idiopathic peripheral neuropathy 08/05/2009   Low back pain 08/05/2009   HAND PAIN, BILATERAL 08/05/2009   TREMOR 03/09/2008   MENOPAUSAL SYNDROME 10/09/2007   DEPRESSION, MAJOR 05/20/2007   BIPOLAR AFFECTIVE DISORDER 05/20/2007   Generalized anxiety disorder 05/20/2007   PERSONALITY DISORDER 05/20/2007   ESOPHAGEAL SPASM 05/20/2007   Diaphragmatic hernia 05/20/2007   COPD mixed type (HCC) 03/27/2007   HYPERCHOLESTEROLEMIA, PURE 12/10/2006   SYMPTOM, SYNDROME, CHRONIC FATIGUE 12/10/2006    Past Medical History:  Diagnosis Date   Allergy Hayfever   1958   Amaurosis fugax    Anemia    hx   Anxiety    Arthritis    Asthma    Blood transfusion without reported diagnosis    Cataract    Chronic fatigue syndrome    Chronic kidney disease    frequency, nephritis when 73 yrs old   COPD (chronic obstructive pulmonary disease) (HCC)    COVID-19    Depression    Diverticulitis    Emphysema of lung (HCC)    Fibromyalgia    GERD (gastroesophageal reflux disease)    occ   H/O hiatal hernia    History of bronchitis    Hyperlipidemia    Hypothyroidism    Interstitial cystitis    Irritable bowel syndrome    Lichen  sclerosus    Lumbar herniated disc    Migraines    Motor nerve conduction block 11/2021   Osteoporosis 09/10/2022   Pneumonia    PONV (postoperative nausea and vomiting)    Shortness of breath    exertion    Thyroid  disease    Graves   Urinary frequency    Urinary tract infection    Vertigo     Family History  Problem Relation Age of Onset   Arthritis Mother    Hypertension Mother    Kidney disease Mother    Heart disease Mother    Stroke Mother    Cancer Father        Bladder   Alcohol abuse Father    Arthritis Sister    Heart disease Sister    Colon cancer Other    Cancer - Lung Cousin    Arthritis Maternal Grandmother    Hearing loss Maternal Grandfather    Varicose Veins Maternal Uncle    Past Surgical History:  Procedure Laterality Date   ABDOMINAL HYSTERECTOMY     bladder sugery      CAROTID DOPPLAR     COLONOSCOPY  05/26/2010   avms- otherwise nl , re check 10y   DEXA-OSTEOPENIA     DOPPLER ECHOCARDIOGRAPHY     elbow surgery     EPICONDYLITIS     EYE SURGERY Bilateral    cataracts   FOOT SURGERY Bilateral    HAND SURGERY Right 09/10/2023   I & D EXTREMITY Right 09/01/2020   Procedure: TYNOSYNOVECTOMY;  Surgeon: Vernetta Lonni GRADE, MD;  Location: [facility] OR;  Service: Orthopedics;  Laterality: Right;   IMPLANTATION VAGAL NERVE STIMULATOR  03/2022   JOINT REPLACEMENT     KNEE SURGERY     Left cartilage   lipoma in second finger right hand     MOUTH SURGERY  08/22/2022   PLANTAR FASCIA SURGERY Left    REVERSE SHOULDER ARTHROPLASTY Left 02/02/2020   Procedure: LEFT REVERSE SHOULDER ARTHROPLASTY;  Surgeon: Addie Cordella Hamilton, MD;  Location[facility] OR;  Service: Orthopedics;  Laterality: Left;   ROTATOR CUFF REPAIR Left 12/2017   TEMPOROMANDIBULAR JOINT SURGERY     TONSILLECTOMY     TOTAL HIP ARTHROPLASTY Right 03/20/2013   Procedure: Right TOTAL HIP ARTHROPLASTY;  Surgeon: Jerona LULLA Sage, MD;  Location: [facility] OR;  Service: Orthopedics;  Laterality: Right;   Right Total Hip Arthroplasty   TOTAL KNEE ARTHROPLASTY Left 01/28/2015   Procedure: LEFT TOTAL KNEE ARTHROPLASTY;  Surgeon: Lonni CINDERELLA Poli, MD;  Location: [facility] ORS;  Service: Orthopedics;  Laterality: Left;   TRIGGER FINGER RELEASE Right    TROCHANTERIC BURSA EXCISION Right 05/03/2016   Dr. Poli Lonni   WRIST ARTHROSCOPY Right    ligament tear   Social History   Social History Narrative   Not on file   Immunization History  Administered Date(s) Administered    sv, Bivalent, Protein Subunit Rsvpref,pf [place]) 12/28/2021   Hep A, Unspecified 04/01/2001   INFLUENZA, HIGH DOSE SEASONAL PF 12/13/2016, 10/30/2022, 12/19/2023   Influenza Split 11/19/2011, 11/02/2013   Influenza Whole 12/20/2006, 11/25/2008, 11/26/2009, 11/24/2010   Influenza,inj,Quad PF,6+ Mos 12/06/2015, 11/28/2021   Influenza-Unspecified 12/08/2012, 12/03/2014, 12/08/2017, 11/10/2018, 10/31/2019   Moderna Sars-Covid-2 Vaccination 06/21/2019, 07/19/2019, 01/22/2020   PFIZER(Purple Top)SARS-COV-2 Vaccination 06/04/2020, 10/30/2022   PNEUMOCOCCAL CONJUGATE-20 12/19/2023   Pfizer Covid-19 Vaccine Bivalent Booster 55yrs & up 12/29/2021   Pfizer(Comirnaty)Fall Seasonal Vaccine 12 years and older 01/11/2024   Pneumococcal Conjugate-13 12/06/2015   Pneumococcal Polysaccharide-23 01/26/2002, 03/23/2008, 09/07/2010, 06/28/2017   Respiratory Syncytial Virus Vaccine,Recomb Aduvanted(Arexvy) 12/29/2021   Td 06/26/2000, 10/19/2011, 02/14/2022   Zoster Recombinant(Shingrix) 09/16/2017, 08/11/2022     Objective: Vital Signs: BP 119/68   Pulse 65   Temp 98 F (36.7 C)   Resp 17   Ht 5' 7 (1.702 m)   Wt 222 lb (100.7 kg)   BMI 34.77 kg/m    Physical Exam Eyes:     Conjunctiva/sclera: Conjunctivae normal.  Cardiovascular:     Rate and Rhythm: Normal rate and regular rhythm.  Pulmonary:     Effort: Pulmonary effort is normal.     Breath sounds: Normal breath sounds.  Skin:    General: Skin is warm  and dry.     Findings: Bruising present.  Neurological:     Mental Status: She is alert.  Psychiatric:        Mood and Affect: Mood normal.      Musculoskeletal Exam:  Shoulders full ROM no tenderness or swelling Left elbow painful to pressure at lateral epicondyle, no palpable effusion and full ROM Right wrist fixation postsurgical changes, MCP extension ROM limited, tenderness to pressure Right thumb severely painful with movement and limited strength, right 3rd finger swan neck and PIP swelling Left wrist pain at radial side and at [facility] joint with overlying swelling, no warmth or erythema Hip normal internal and external rotation without pain, no tenderness to lateral hip palpation Knees full ROM no tenderness or swelling Legs severely painful to light pressure over shins and feet, no palpable swelling  Investigation: No additional findings.  Imaging: MM 3D DIAGNOSTIC MAMMOGRAM BILATERAL  BREAST Result Date: 02/05/2024 CLINICAL DATA:  74 year old female presents for follow-up evaluation of left breast calcifications. EXAM: DIGITAL DIAGNOSTIC BILATERAL MAMMOGRAM WITH TOMOSYNTHESIS AND CAD TECHNIQUE: Bilateral digital diagnostic mammography and breast tomosynthesis was performed. The images were evaluated with computer-aided detection. COMPARISON:  Previous exam(s). ACR Breast Density Category c: The breasts are heterogeneously dense, which may obscure small masses. FINDINGS: Right mammogram:  No findings suspicious for malignancy. Left mammogram: Stable benign appearing calcifications about the slightly inner breast, middle third. This completes 24 months of BI-RADS 3 surveillance for this finding. No findings suspicious for malignancy. IMPRESSION: No mammographic evidence of malignancy. RECOMMENDATION: Return to annual screening mammograms. I have discussed the findings and recommendations with the patient. If applicable, a reminder letter will be sent to the patient regarding the next  appointment. BI-RADS CATEGORY  2: Benign. Electronically Signed   By: Curtistine Noble   On: 02/05/2024 11:35    Recent Labs: Lab Results  Component Value Date   WBC 5.3 02/11/2024   HGB 12.2 02/11/2024   PLT 171 02/11/2024   NA 142 02/11/2024   K 4.8 02/11/2024   CL 100 02/11/2024   CO2 34 (H) 02/11/2024   GLUCOSE 103 (H) 02/11/2024   BUN 32 (H) 02/11/2024   CREATININE 1.17 (H) 02/11/2024   BILITOT 1.1 02/11/2024   ALKPHOS 40 05/04/2023   AST 21 02/11/2024   ALT 22 02/11/2024   PROT 6.5 02/11/2024   ALBUMIN 3.2 (L) 05/04/2023   CALCIUM 9.8 02/11/2024   GFRAA >60 01/29/2015   QFTBGOLDPLUS NEGATIVE 02/11/2024    Speciality Comments: No specialty comments available.  Procedures:  No procedures performed Allergies: Lithium; Tegretol [carbamazepine]; Tricyclic antidepressants; Methotrexate  and trimetrexate; Strawberry extract; Tapentadol; Codeine; Cymbalta [duloxetine hcl]; Erythromycin; Lyrica  [pregabalin ]; Neurontin [gabapentin]; Oxycodone ; Rabeprazole sodium; Baclofen ; Duraprep [antiseptic products, misc.]; Penicillins; and Tape   Assessment / Plan:     Visit Diagnoses: Seronegative rheumatoid arthritis (HCC) - Plan: HYDROcodone -acetaminophen  (NORCO/VICODIN) 5-325 MG tablet, Sedimentation rate, predniSONE  (DELTASONE ) 10 MG tablet, Tocilizumab  (ACTEMRA  ACTPEN) 162 MG/0.9ML SOAJ Chronic seronegative rheumatoid arthritis with recent flare-up. Inflammation markers normalized. Pain and swelling managed with prednisone . Gabapentin and Lyrica  not tolerated. Not able to successfully taper without severe activity. Reent flare in October with increased back to 20 mg. - Ordered blood test to check inflammation markers. - Continue Actemra  162 mg Loiza weekly - Continue prednisone  10 mg daily for now  High risk medication use - start Actemra  162 mg  q14days - Plan: CBC with Differential/Platelet, Comprehensive metabolic panel with GFR, QuantiFERON-TB Gold Plus No serious interval  infections.  Tolerating medications without major side effects.  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 Previous prednisone  provided relief. Surgery planned for frozen fingers. - Continue current pain management regimen. - Proceed with planned surgery for frozen fingers.  Long term current use of opioid - Plan: DRUG MONITOR, PANEL 5, W/CONF, URINE Chronic pain syndrome Multiple comorbidities including herniated disc, arthritis, and compound fractures. Pain managed with hydrocodone , used sparingly due to addiction concerns. Medical marijuana not feasible in [place] . - UDS screening for controlled medication monitoring - Increase Norco to 5 mg TID PRN for severe functionally limiting pain - Recommended to avoid medical marijuana due to regulatory issues.      Orders: Orders Placed This Encounter  Procedures   Sedimentation rate   CBC with Differential/Platelet   Comprehensive metabolic panel with GFR  QuantiFERON-TB Gold Plus   DRUG MONITOR, PANEL 5, W/CONF, URINE   Meds ordered this encounter  Medications   HYDROcodone -acetaminophen  (NORCO/VICODIN) 5-325 MG tablet    Sig: Take 1 tablet by mouth 3 (three) times daily as needed for severe pain (pain score 7-10).    Dispense:  90 tablet    Refill:  0   predniSONE  (DELTASONE ) 10 MG tablet    Sig: Take 2 tablets (20 mg total) by mouth daily with breakfast for 15 days, THEN 1 tablet (10 mg total) daily with breakfast.    Dispense:  105 tablet    Refill:  0   Tocilizumab  (ACTEMRA  ACTPEN) 162 MG/0.9ML SOAJ    Sig: Inject 162 mg into the skin every 7 (seven) days.    Dispense:  10.8 mL    Refill:  0     Follow-Up Instructions: Return in about 3 months (around 05/11/2024) for RA on TOC/GC/Norco f/u 3mos.   Lonni LELON Ester, MD  Note - This record has been created using [employer].   Chart creation errors have been sought, but may not always  have been located. Such creation errors do not reflect on  the standard of medical care. "

## 2024-02-03 NOTE — Telephone Encounter (Signed)
 Pt called requesting an injectiong. Last injection 08/2022. Please call pt for appt. Pt number is [phone].

## 2024-02-03 NOTE — Telephone Encounter (Signed)
 Lmx1 to answer injection screening questions

## 2024-02-05 ENCOUNTER — Ambulatory Visit: Payer: Self-pay | Admitting: Family Medicine

## 2024-02-05 ENCOUNTER — Ambulatory Visit
Admission: RE | Admit: 2024-02-05 | Discharge: 2024-02-05 | Disposition: A | Discharge status: Home / Self Care | Source: Ambulatory Visit | Attending: Family Medicine | Admitting: Family Medicine

## 2024-02-05 DIAGNOSIS — R921 Mammographic calcification found on diagnostic imaging of breast: Secondary | ICD-10-CM

## 2024-02-06 ENCOUNTER — Other Ambulatory Visit: Payer: Self-pay | Admitting: Family Medicine

## 2024-02-11 ENCOUNTER — Other Ambulatory Visit: Payer: Self-pay

## 2024-02-11 ENCOUNTER — Encounter: Payer: Self-pay | Admitting: Internal Medicine

## 2024-02-11 ENCOUNTER — Ambulatory Visit: Attending: Internal Medicine | Admitting: Internal Medicine

## 2024-02-11 VITALS — BP 119/68 | HR 65 | Temp 98.0°F | Resp 17 | Ht 67.0 in | Wt 222.0 lb

## 2024-02-11 DIAGNOSIS — Z79899 Other long term (current) drug therapy: Secondary | ICD-10-CM | POA: Insufficient documentation

## 2024-02-11 DIAGNOSIS — M0600 Rheumatoid arthritis without rheumatoid factor, unspecified site: Secondary | ICD-10-CM

## 2024-02-11 DIAGNOSIS — Z79891 Long term (current) use of opiate analgesic: Secondary | ICD-10-CM

## 2024-02-11 DIAGNOSIS — M654 Radial styloid tenosynovitis [de Quervain]: Secondary | ICD-10-CM | POA: Insufficient documentation

## 2024-02-11 DIAGNOSIS — M7712 Lateral epicondylitis, left elbow: Secondary | ICD-10-CM | POA: Insufficient documentation

## 2024-02-11 MED ORDER — HYDROCODONE-ACETAMINOPHEN 5-325 MG PO TABS: 1.0000 | ORAL_TABLET | Freq: Three times a day (TID) | ORAL | 0 refills | Status: AC | PRN

## 2024-02-11 MED ORDER — PREDNISONE 10 MG PO TABS: ORAL_TABLET | ORAL | 0 refills | Status: AC

## 2024-02-11 MED ORDER — ACTEMRA ACTPEN 162 MG/0.9ML ~~LOC~~ SOAJ
162.0000 mg | SUBCUTANEOUS | 0 refills | Status: AC
  Filled 2024-02-11: qty 10.8, 84d supply, fill #0
  Filled 2024-02-19: qty 3.6, 28d supply, fill #0
  Filled 2024-03-20 – 2024-03-23 (×2): qty 3.6, 28d supply, fill #1

## 2024-02-14 ENCOUNTER — Ambulatory Visit: Admitting: Physical Medicine and Rehabilitation

## 2024-02-14 ENCOUNTER — Encounter: Payer: Self-pay | Admitting: Physical Medicine and Rehabilitation

## 2024-02-14 DIAGNOSIS — M545 Low back pain, unspecified: Secondary | ICD-10-CM

## 2024-02-14 DIAGNOSIS — G894 Chronic pain syndrome: Secondary | ICD-10-CM

## 2024-02-14 DIAGNOSIS — G8929 Other chronic pain: Secondary | ICD-10-CM

## 2024-02-14 DIAGNOSIS — M47819 Spondylosis without myelopathy or radiculopathy, site unspecified: Secondary | ICD-10-CM | POA: Diagnosis not present

## 2024-02-14 LAB — CBC WITH DIFFERENTIAL/PLATELET
Absolute Lymphocytes: 1855 {cells}/uL (ref 850–3900)
Absolute Monocytes: 270 {cells}/uL (ref 200–950)
Basophils Absolute: 11 {cells}/uL (ref 0–200)
Basophils Relative: 0.2 %
Eosinophils Absolute: 11 {cells}/uL — ABNORMAL LOW (ref 15–500)
Eosinophils Relative: 0.2 %
HCT: 39.1 % (ref 35.9–46.0)
Hemoglobin: 12.2 g/dL (ref 11.7–15.5)
MCH: 31 pg (ref 27.0–33.0)
MCHC: 31.2 g/dL — ABNORMAL LOW (ref 31.6–35.4)
MCV: 99.2 fL (ref 81.4–101.7)
MPV: 11.1 fL (ref 7.5–12.5)
Monocytes Relative: 5.1 %
Neutro Abs: 3154 {cells}/uL (ref 1500–7800)
Neutrophils Relative %: 59.5 %
Platelets: 171 Thousand/uL (ref 140–400)
RBC: 3.94 Million/uL (ref 3.80–5.10)
RDW: 15 % (ref 11.0–15.0)
Total Lymphocyte: 35 %
WBC: 5.3 Thousand/uL (ref 3.8–10.8)

## 2024-02-14 LAB — SEDIMENTATION RATE: Sed Rate: 22 mm/h (ref 0–30)

## 2024-02-14 LAB — COMPREHENSIVE METABOLIC PANEL WITH GFR
AG Ratio: 2.4 (calc) (ref 1.0–2.5)
ALT: 22 U/L (ref 6–29)
AST: 21 U/L (ref 10–35)
Albumin: 4.6 g/dL (ref 3.6–5.1)
Alkaline phosphatase (APISO): 29 U/L — ABNORMAL LOW (ref 37–153)
BUN/Creatinine Ratio: 27 (calc) — ABNORMAL HIGH (ref 6–22)
BUN: 32 mg/dL — ABNORMAL HIGH (ref 7–25)
CO2: 34 mmol/L — ABNORMAL HIGH (ref 20–32)
Calcium: 9.8 mg/dL (ref 8.6–10.4)
Chloride: 100 mmol/L (ref 98–110)
Creat: 1.17 mg/dL — ABNORMAL HIGH (ref 0.60–1.00)
Globulin: 1.9 g/dL (ref 1.9–3.7)
Glucose, Bld: 103 mg/dL — ABNORMAL HIGH (ref 65–99)
Potassium: 4.8 mmol/L (ref 3.5–5.3)
Sodium: 142 mmol/L (ref 135–146)
Total Bilirubin: 1.1 mg/dL (ref 0.2–1.2)
Total Protein: 6.5 g/dL (ref 6.1–8.1)
eGFR: 49 mL/min/1.73m2 — ABNORMAL LOW (ref >=60)

## 2024-02-14 LAB — QUANTIFERON-TB GOLD PLUS
Mitogen-NIL: 6.79 [IU]/mL
NIL: 0.03 [IU]/mL
QuantiFERON-TB Gold Plus: NEGATIVE
TB1-NIL: <0 [IU]/mL
TB2-NIL: <0 [IU]/mL

## 2024-02-14 NOTE — Progress Notes (Unsigned)
 "  Lori Orozco - 73 y.o. female MRN [id number]  Date of birth: April 13, 1950  Office Visit Note: Visit Date: 02/14/2024 PCP: Randeen Laine LABOR, MD Referred by: Tower, Laine LABOR, MD  Subjective: Chief Complaint  Patient presents with   Lower Back - Pain   HPI: Lori Orozco is a 73 y.o. female who comes in today for evaluation of chronic, worsening and severe bilateral lower back pain. No radicular pain down the right leg. She is well known to us . Her health has significantly declined since our last visit. She was hospitalized in the ICU this past March due to pulmonary embolism. She is now living with her sister and requires assistance with activities of daily living. Pain ongoing for several years, worsens with movement and activity. She describes her pain as sore and aching sensation, currently rates as 10 out of 10. Some relief of pain with rest and use of medications. She is being managed by Dr. Lonni Ester for rheumatoid arthritis, he has recently prescribed Norco for chronic pain. Lumbar MRI imaging from 2024 shows moderate foraminal narrowing on the right at L5-S1, chronic T12 compression fracture, subacute inferior endplate fracture at L5. Prior laminectomy at T11-12 and T12-L1 without residual or recurrent stenosis. No high grade spinal canal stenosis. She was previously managed Dr. Kapural at [facility]. She had Biotronik spinal cord stimulator implanted in 2022.   We performed right L5 transforaminal epidural steroid injection on 09/25/2022. She feels this did help to resolve her right leg symptoms, however chronic lower back pain remains. History of bilateral L4-L5 and L5-S1 facet joint/medial branch blocks  on 07/27/2021 that provided less than 50% pain relief for 2-3 hours.   Patient denies recent trauma or falls. No bowel/bladder incontinence. She is currently using rolling walker to assist with ambulation.  Patients course is complicated by COPD, diabetes  mellitus, PE, osteoporosis, chronic kidney disease, depression, bipolar disorder, chronic pain, neuropathy and rheumatoid arthritis. She is currently taking Eliquis . She reports history of frequent falls. History of right wrist fusion in July of this year with Dr. Lucillie [facility]). She has plans to follow for second surgery to repair tendons.      Review of Systems  Constitutional:  Positive for malaise/fatigue.  Musculoskeletal:  Positive for back pain, falls, joint pain and myalgias.  Neurological:  Positive for weakness and headaches.  All other systems reviewed and are negative.  Otherwise per HPI.  Assessment & Plan: Visit Diagnoses:    ICD-10-CM   1. Chronic bilateral low back pain without sciatica  M54.50    G89.29     2. Spondylosis without myelopathy or radiculopathy  M47.819     3. Chronic pain syndrome  G89.4        Plan: Findings:  Chronic recalcitrant axial lower back pain. No radicular symptoms down the legs. Patient continues to have severe pain despite good conservative therapies such as physical therapy, home exercise regimen, rest and use of medications. Her pain is consistent with more facet mediated discomfort, however minimal relief of pain with lumbar facet joint injections. Resolution of right sided leg pain from previous right L5 transforaminal epidural steroid injection on 09/25/2022. We discussed treatment plan in detail today. I think her best option is to follow up with Dr. Kapural to discuss re-programming of spinal cord stimulator. Our office is not familiar with this type of stimulator.   Of note, she is scheduled to follow up with Dr. Georgina in the coming weeks. She  did elect to proceed with surgical intervention last year, however her right sided symptoms resolved over time. I explained to her that typically spinal surgery is performed for nerve compression and is not typically beneficial in treating lower back discomfort. I encouraged her to keep  appointment with Dr. Georgina, any input from him is appreciated. She can continue with Norco prescribed by Dr. Jeannetta. I do think at home PT would also be beneficial for her as she is significantly de-conditioned and does have a significant amount of atrophy noted to lumbar paraspinal regions. Should her right sided radicular symptoms return we are happy to look at lumbar epidural steroid injection, however at this point we do not have any other treatment suggestions to address her chronic lower back pain.     Meds & Orders: No orders of the defined types were placed in this encounter.  No orders of the defined types were placed in this encounter.   Follow-up: Return if symptoms worsen or fail to improve.   Procedures: No procedures performed      Clinical History: MRI LUMBAR SPINE WITHOUT CONTRAST   TECHNIQUE: Multiplanar, multisequence MR imaging of the lumbar spine was performed. No intravenous contrast was administered.   COMPARISON:  Lumbar spine radiograph in/17/23. MRI of lumbar spine 09/28/2021.   FINDINGS: Segmentation: 5 non rib-bearing lumbar type vertebral bodies are present. The lowest fully formed vertebral body is L5.   Alignment: Slight retrolisthesis at L1-2 is stable. No other significant listhesis is present. Lumbar lordosis is normal.   Vertebrae: A vertebral plana compression fracture at T12 is new. The vertebral body is reduced to 7 mm anteriorly. Minimal edematous changes are present along the superior endplate.   An inferior endplate fracture at L5 demonstrates 40% loss of height. More moderate edema present throughout the vertebral body with some sparing of the superior endplate.   Scattered fatty infiltration of the marrow is present in the remaining lumbar vertebral bodies. Vertebral body heights are otherwise normal.   Conus medullaris and cauda equina: Conus extends to the T12-L1 level. Conus and cauda equina appear normal.   Paraspinal and other  soft tissues: Limited imaging the abdomen is unremarkable. There is no significant adenopathy. No solid organ lesions are present.   Disc levels:   T11-12: Slight retropulsed bone is present. Laminectomy present. Central canal is decompressed. Susceptibility artifact on the left may represent scar tissue.   T12-L1: Laminectomy noted. No residual or recurrent stenosis is present.   L1-2: Mild disc bulging is present. No significant stenosis is present.   L2-3: Mild disc bulging is present. No significant stenosis is present.   L3-4: Disc desiccation is present. No focal protrusion is present. Mild facet hypertrophy is present bilaterally. No significant stenosis is present.   L1-2: Chronic loss of disc height is present. No focal protrusion or stenosis is present.   L5-S1: A rightward disc protrusion is again noted. Moderate right foraminal narrowing has progressed.   IMPRESSION: 1. Acute/subacute inferior endplate fracture at L5 with 40% loss of height. 2. Vertebral plana compression fracture at T12 with 7 mm of anterior height loss. Minimal edematous changes are present along the superior endplate suggesting incomplete healing. 3. Laminectomy at T11-12 and T12-L1 without residual or recurrent stenosis. 4. 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 5. Mild disc bulging at L1-2, L2-3, and L3-4 without significant stenosis.     Electronically Signed   By: Lonni Necessary M.D.   On: 09/01/2022 21:01   She  reports that she quit smoking about 10 years ago. Her smoking use included cigarettes. She started smoking about 54 years ago. She has a 86 pack-year smoking history. She has been exposed to tobacco smoke. She has never used smokeless tobacco.  Recent Labs    05/05/23 0427  HGBA1C 5.6    Objective:  VS:  HT:    WT:   BMI:     BP:   HR: bpm  TEMP: ( )  RESP:  Physical Exam Vitals and nursing note reviewed.  HENT:     Head: Normocephalic  and atraumatic.     Right Ear: External ear normal.     Left Ear: External ear normal.     Nose: Nose normal.     Mouth/Throat:     Mouth: Mucous membranes are moist.  Eyes:     Extraocular Movements: Extraocular movements intact.  Cardiovascular:     Rate and Rhythm: Normal rate.     Pulses: Normal pulses.  Pulmonary:     Effort: Pulmonary effort is normal.  Abdominal:     General: Abdomen is flat. There is no distension.  Musculoskeletal:        General: Tenderness present.     Cervical back: Normal range of motion.     Comments: Patient is slow to rise from seated position to standing. Concordant low back pain with facet loading, lumbar spine extension and rotation. 5/5 strength noted with bilateral hip flexion, knee flexion/extension, ankle dorsiflexion/plantarflexion and 4/5 with EHL. No clonus noted bilaterally. No pain upon palpation of greater trochanters. No pain with internal/external rotation of bilateral hips. Sensation intact bilaterally. Negative slump test bilaterally. Bilateral lower legs are very sensitive to touch. Diffuse myofascial tenderness noted to bilateral lumbar paraspinal regions. Ambulates with rolling walker, gait slow and unsteady.       Skin:    General: Skin is warm and dry.     Capillary Refill: Capillary refill takes less than 2 seconds.  Neurological:     Mental Status: She is alert and oriented to person, place, and time.     Motor: Weakness present.     Gait: Gait abnormal.  Psychiatric:        Mood and Affect: Mood normal.        Behavior: Behavior normal.     Ortho Exam  Imaging: No results found.  Past Medical/Family/Surgical/Social History: Medications & Allergies reviewed per EMR, new medications updated. Patient Active Problem List   Diagnosis Date Noted   Lateral epicondylitis, left elbow 11/11/2023   Tenosynovitis, de Quervain 11/11/2023   Positive colorectal cancer screening using Cologuard test 08/12/2023   Colon cancer  screening 07/17/2023   Diastolic dysfunction 06/06/2023   Class 2 severe obesity due to excess calories with serious comorbidity and body mass index (BMI) of 35.0 to 35.9 in adult 06/06/2023   CKD stage 3b, GFR 30-44 ml/min (HCC) 05/05/2023   Elevated troponin 05/05/2023   Depression with anxiety 05/05/2023   Cerumen impaction 03/06/2023   Lesion of palate 03/06/2023   Mobility impaired 02/10/2023   Generalized weakness 02/08/2023   Recurrent falls 12/24/2022   Pre-operative clearance 10/30/2022   Diarrhea 10/19/2022   Cold intolerance 10/19/2022   Osteoporosis 09/10/2022   Thoracic compression fracture (HCC) 07/13/2022   Diabetes mellitus screening 07/13/2022   Lumbar herniated disc    Chronic pain 11/28/2021   Bilateral leg weakness 10/09/2021   Vitamin B12 deficiency 08/02/2021   Myofascial pain dysfunction syndrome 08/02/2021   Adverse effect of  prednisone  07/11/2021   AVM (arteriovenous malformation) 01/03/2021   History of pulmonary embolism 12/08/2020   Arthritis of knee 12/06/2020   Bilateral knee pain 09/20/2020   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   High risk medication use 09/20/2020   Extensor tenosynovitis of right wrist 09/15/2020   S/P reverse total shoulder arthroplasty, 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   Lung nodule 01/02/2020   Status post arthroscopy of left shoulder 01/16/2018   Estrogen deficiency 06/28/2017   Medial epicondylitis, left elbow 04/29/2017   S/P arthroscopy of left shoulder 02/25/2017   Impingement syndrome of left shoulder 01/30/2017   Trochanteric bursitis, right hip 04/10/2016   Hypertension 09/02/2015   Urge incontinence 07/27/2015   Obesity (BMI 30-39.9) 04/28/2015   Osteoarthritis of left knee 01/28/2015   Status post total left knee replacement 01/28/2015   Vitamin D  deficiency 04/27/2014   Seasonal and perennial allergic rhinitis 06/17/2013   S/P total hip arthroplasty 03/20/2013   Medicare annual wellness visit, subsequent  11/18/2012   At high risk for falls 11/18/2012   Routine general medical examination at a health care facility 11/10/2012   Lichen sclerosus et atrophicus of the vulva 07/29/2012   COPD exacerbation (HCC) 11/07/2011   Osteopenia 10/19/2011   Hypothyroidism 10/19/2011   Hereditary and idiopathic peripheral neuropathy 08/05/2009   Low back pain 08/05/2009   HAND PAIN, BILATERAL 08/05/2009   TREMOR 03/09/2008   MENOPAUSAL SYNDROME 10/09/2007   DEPRESSION, MAJOR 05/20/2007   BIPOLAR AFFECTIVE DISORDER 05/20/2007   Generalized anxiety disorder 05/20/2007   PERSONALITY DISORDER 05/20/2007   ESOPHAGEAL SPASM 05/20/2007   Diaphragmatic hernia 05/20/2007   COPD mixed type (HCC) 03/27/2007   HYPERCHOLESTEROLEMIA, PURE 12/10/2006   SYMPTOM, SYNDROME, CHRONIC FATIGUE 12/10/2006   Past Medical History:  Diagnosis Date   Allergy Hayfever   1958   Amaurosis fugax    Anemia    hx   Anxiety    Arthritis    Asthma    Blood transfusion without reported diagnosis    Cataract    Chronic fatigue syndrome    Chronic kidney disease    frequency, nephritis when 73 yrs old   COPD (chronic obstructive pulmonary disease) (HCC)    COVID-19    Depression    Diverticulitis    Emphysema of lung (HCC)    Fibromyalgia    GERD (gastroesophageal reflux disease)    occ   H/O hiatal hernia    History of bronchitis    Hyperlipidemia    Hypothyroidism    Interstitial cystitis    Irritable bowel syndrome    Lichen sclerosus    Lumbar herniated disc    Migraines    Motor nerve conduction block 11/2021   Osteoporosis 09/10/2022   Pneumonia    PONV (postoperative nausea and vomiting)    Shortness of breath    exertion    Thyroid  disease    Graves   Urinary frequency    Urinary tract infection    Vertigo    Family History  Problem Relation Age of Onset   Arthritis Mother    Hypertension Mother    Kidney disease Mother    Heart disease Mother    Stroke Mother    Cancer Father         Bladder   Alcohol abuse Father    Arthritis Sister    Heart disease Sister    Colon cancer Other    Cancer - Lung Cousin    Arthritis Maternal Grandmother    Hearing loss Maternal Grandfather  Varicose Veins Maternal Uncle    Past Surgical History:  Procedure Laterality Date   ABDOMINAL HYSTERECTOMY     bladder sugery      CAROTID DOPPLAR     COLONOSCOPY  05/26/2010   avms- otherwise nl , re check 10y   DEXA-OSTEOPENIA     DOPPLER ECHOCARDIOGRAPHY     elbow surgery     EPICONDYLITIS     EYE SURGERY Bilateral    cataracts   FOOT SURGERY Bilateral    HAND SURGERY Right 09/10/2023   I & D EXTREMITY Right 09/01/2020   Procedure: TYNOSYNOVECTOMY;  Surgeon: Vernetta Lonni GRADE, MD;  Location: [facility] OR;  Service: Orthopedics;  Laterality: Right;   IMPLANTATION VAGAL NERVE STIMULATOR  03/2022   JOINT REPLACEMENT     KNEE SURGERY     Left cartilage   lipoma in second finger right hand     MOUTH SURGERY  08/22/2022   PLANTAR FASCIA SURGERY Left    REVERSE SHOULDER ARTHROPLASTY Left 02/02/2020   Procedure: LEFT REVERSE SHOULDER ARTHROPLASTY;  Surgeon: Addie Cordella Hamilton, MD;  Location: [facility] OR;  Service: Orthopedics;  Laterality: Left;   ROTATOR CUFF REPAIR Left 12/2017   TEMPOROMANDIBULAR JOINT SURGERY     TONSILLECTOMY     TOTAL HIP ARTHROPLASTY Right 03/20/2013   Procedure: Right TOTAL HIP ARTHROPLASTY;  Surgeon: Jerona LULLA Sage, MD;  Location: [facility] OR;  Service: Orthopedics;  Laterality: Right;  Right Total Hip Arthroplasty   TOTAL KNEE ARTHROPLASTY Left 01/28/2015   Procedure: LEFT TOTAL KNEE ARTHROPLASTY;  Surgeon: Lonni GRADE Vernetta, MD;  Location: [facility] ORS;  Service: Orthopedics;  Laterality: Left;   TRIGGER FINGER RELEASE Right    TROCHANTERIC BURSA EXCISION Right 05/03/2016   Dr. Vernetta Lonni   WRIST ARTHROSCOPY Right    ligament tear   Social History   Occupational History   Not on file  Tobacco Use   Smoking status: Former    Current packs/day: 0.00     Average packs/day: 2.0 packs/day for 43.0 years (86.0 ttl pk-yrs)    Types: Cigarettes    Start date: 02/26/1970    Quit date: 02/26/2013    Years since quitting: 10.9    Passive exposure: Past   Smokeless tobacco: Never   Tobacco comments:    Vaping occasionally  Vaping Use   Vaping status: Former   Devices: haven't used in 8 months  Substance and Sexual Activity   Alcohol use: No   Drug use: No   Sexual activity: Not Currently   "

## 2024-02-14 NOTE — Progress Notes (Unsigned)
 Pain Scale   Average Pain 10 Patient advising she has lower back pain that is constant.        +Driver, -BT, -Dye Allergies.

## 2024-02-18 ENCOUNTER — Telehealth: Payer: Self-pay

## 2024-02-18 NOTE — Telephone Encounter (Signed)
 RN attempted to return patient call regarding a refill for Clobetasol  cream. RN left HIPAA compliant voicemail to return office call.  Silvano LELON Piano, RN

## 2024-02-19 ENCOUNTER — Other Ambulatory Visit: Payer: Self-pay

## 2024-02-21 ENCOUNTER — Other Ambulatory Visit: Payer: Self-pay | Admitting: Obstetrics and Gynecology

## 2024-02-21 ENCOUNTER — Other Ambulatory Visit: Payer: Self-pay

## 2024-02-21 ENCOUNTER — Encounter: Payer: Self-pay | Admitting: Obstetrics and Gynecology

## 2024-02-21 DIAGNOSIS — N904 Leukoplakia of vulva: Secondary | ICD-10-CM

## 2024-02-21 MED ORDER — CLOBETASOL PROPIONATE 0.05 % EX CREA: TOPICAL_CREAM | CUTANEOUS | 0 refills | Status: AC

## 2024-02-21 NOTE — Progress Notes (Signed)
 Specialty Pharmacy Refill Coordination Note  Lori Orozco is a 73 y.o. female contacted today regarding refills of specialty medication(s) Tocilizumab  (Actemra  ACTPen)   Patient requested Delivery   Delivery date: 03/03/24   Verified address: [address] [name] [address]   Medication will be filled on: 03/02/24

## 2024-02-24 ENCOUNTER — Ambulatory Visit (INDEPENDENT_AMBULATORY_CARE_PROVIDER_SITE_OTHER): Admitting: Otolaryngology

## 2024-02-24 ENCOUNTER — Ambulatory Visit (INDEPENDENT_AMBULATORY_CARE_PROVIDER_SITE_OTHER)

## 2024-02-24 ENCOUNTER — Ambulatory Visit (INDEPENDENT_AMBULATORY_CARE_PROVIDER_SITE_OTHER): Admitting: Audiology

## 2024-02-24 DIAGNOSIS — H903 Sensorineural hearing loss, bilateral: Secondary | ICD-10-CM | POA: Diagnosis not present

## 2024-02-24 NOTE — Progress Notes (Signed)
" °  [address] [place], [place] [address] [phone]  Audiological Evaluation    Name: Lori Orozco     DOB:   06-Aug-1950      MRN:   [id number]                                                                                     Service Date: 02/24/2024     Accompanied by: self    Patient comes today after Dr. Tobie, ENT sent a referral for a hearing evaluation due to concerns with hearing loss.   Symptoms Yes Details  Hearing loss  [x]  Was told she had a hearing loss a couple of years ago  Tinnitus  [x]  No ringing but a slushing sound in the left ear after ears are cleaned  Ear pain/ infections/pressure  []    Balance problems  []  none  Noise exposure history  []    Previous ear surgeries  []    Family history of hearing loss  []    Amplification  []    Other  [x]  Often has occluding cerumen and needs ears to be cleaned    Otoscopy: Right ear: Non-occluding cerumen, able to visualize some tympanic membrane landmarks. Left ear:  Non-occluding cerumen, able to visualize some tympanic membrane landmarks.  Tympanometry: Right ear: Type A - Normal external ear canal volume with normal middle ear pressure and normal tympanic membrane compliance. Findings are consistent with normal middle ear function. Left ear: Type A - Normal external ear canal volume with normal middle ear pressure and normal tympanic membrane compliance. Findings are consistent with normal middle ear function.  Hearing Evaluation The hearing test results were completed under headphones and re-checked with inserts and results are deemed to be of good reliability. Test technique:  conventional    Pure tone Audiometry: Right ear- Normal hearing at 250-500 Hz and than mild sloping to moderately-severe sensorineural hearing loss from 1000 Hz - 8000 Hz. Left ear-  Normal hearing at 250 Hz and than mild sloping to severe sensorineural hearing loss from 500 Hz - 8000 Hz.  Speech Audiometry: Right  ear- Speech Reception Threshold (SRT) was obtained at 35 dBHL. Left ear-Speech Reception Threshold (SRT) was obtained at 30 dBHL.   Word Recognition Score Tested using NU-6 (recorded) Right ear: 92% was obtained at a presentation level of 75 dBHL with contralateral masking which is deemed as  excellent. Left ear: 96% was obtained at a presentation level of 75 dBHL with contralateral masking which is deemed as  excellent.   Impression: There is not a significant difference in pure-tone thresholds between ears., There is not a significant difference in the word recognition score in between ears.    Recommendations: Follow up with ENT as scheduled. Return for a hearing evaluation if concerns with hearing changes arise or per MD recommendation. Consider a communication needs assessment for amplification after medical clearance is obtained, if needed.   Arlean Rake, AUD      "

## 2024-03-01 ENCOUNTER — Other Ambulatory Visit: Payer: Self-pay | Admitting: Family Medicine

## 2024-03-02 ENCOUNTER — Other Ambulatory Visit (HOSPITAL_COMMUNITY): Payer: Self-pay

## 2024-03-02 ENCOUNTER — Other Ambulatory Visit: Payer: Self-pay

## 2024-03-02 NOTE — Progress Notes (Signed)
 Patient copay changed to $12.65, likely due to deductible. Per patient she does not have a deductible and has never paid for medication. patient will call insurance company and follow up with pharmacy

## 2024-03-04 ENCOUNTER — Telehealth: Admitting: Obstetrics and Gynecology

## 2024-03-04 VITALS — Wt 228.0 lb

## 2024-03-04 DIAGNOSIS — N904 Leukoplakia of vulva: Secondary | ICD-10-CM | POA: Diagnosis not present

## 2024-03-04 NOTE — Progress Notes (Signed)
 "  TELEHEALTH GYNECOLOGY VISIT ENCOUNTER NOTE  Provider location: [facility]   Patient location: Home  I connected with Lori Orozco on 03/04/2024 at 12:21PM by telephone and verified that I am speaking with the correct person using two identifiers. Patient was unable to do MyChart audiovisual encounter due to technical difficulties, she tried several times.    History:  Lori Orozco is a 74 y.o. G0P0000 here to discuss clobetasol  refill.   Dx with lichen sclerosus and has been on clobetasol . Hasn't been seen for an exam since 2022.   Reports that she is symptom free. Rarely has itching. Checks vulva regularly and has not seen any skin changes. She hasn't needed the clobetasol  in over a year for symptoms, but likes to have it on hand just in case her symptoms flare.   She is dealing with severe chronic pain due to RA and needs help with her ADLS. Reports she is mostly home bound now. Uses walker/wheelchair. She is worried about getting around our office and up/down from exam table.     Past Medical History:  Diagnosis Date   Allergy Hayfever   1958   Amaurosis fugax    Anemia    hx   Anxiety    Arthritis    Asthma    Blood transfusion without reported diagnosis    Cataract    Chronic fatigue syndrome    Chronic kidney disease    frequency, nephritis when 74 yrs old   COPD (chronic obstructive pulmonary disease) (HCC)    COVID-19    Depression    Diverticulitis    Emphysema of lung (HCC)    Fibromyalgia    GERD (gastroesophageal reflux disease)    occ   H/O hiatal hernia    History of bronchitis    Hyperlipidemia    Hypothyroidism    Interstitial cystitis    Irritable bowel syndrome    Lichen sclerosus    Lumbar herniated disc    Migraines    Motor nerve conduction block 11/2021   Osteoporosis 09/10/2022   Pneumonia    PONV (postoperative nausea and vomiting)    Shortness of breath    exertion    Thyroid  disease     Graves   Urinary frequency    Urinary tract infection    Vertigo    Past Surgical History:  Procedure Laterality Date   ABDOMINAL HYSTERECTOMY     bladder sugery      CAROTID DOPPLAR     COLONOSCOPY  05/26/2010   avms- otherwise nl , re check 10y   DEXA-OSTEOPENIA     DOPPLER ECHOCARDIOGRAPHY     elbow surgery     EPICONDYLITIS     EYE SURGERY Bilateral    cataracts   FOOT SURGERY Bilateral    HAND SURGERY Right 09/10/2023   I & D EXTREMITY Right 09/01/2020   Procedure: TYNOSYNOVECTOMY;  Surgeon: Vernetta Lonni GRADE, MD;  Location: [facility] OR;  Service: Orthopedics;  Laterality: Right;   IMPLANTATION VAGAL NERVE STIMULATOR  03/2022   JOINT REPLACEMENT     KNEE SURGERY     Left cartilage   lipoma in second finger right hand     MOUTH SURGERY  08/22/2022   PLANTAR FASCIA SURGERY Left    REVERSE SHOULDER ARTHROPLASTY Left 02/02/2020   Procedure: LEFT REVERSE SHOULDER ARTHROPLASTY;  Surgeon: Addie Cordella Hamilton, MD;  Location: [facility] OR;  Service: Orthopedics;  Laterality: Left;   ROTATOR CUFF REPAIR Left 12/2017  TEMPOROMANDIBULAR JOINT SURGERY     TONSILLECTOMY     TOTAL HIP ARTHROPLASTY Right 03/20/2013   Procedure: Right TOTAL HIP ARTHROPLASTY;  Surgeon: Jerona LULLA Sage, MD;  Location: [facility] OR;  Service: Orthopedics;  Laterality: Right;  Right Total Hip Arthroplasty   TOTAL KNEE ARTHROPLASTY Left 01/28/2015   Procedure: LEFT TOTAL KNEE ARTHROPLASTY;  Surgeon: Lonni CINDERELLA Poli, MD;  Location: [facility] ORS;  Service: Orthopedics;  Laterality: Left;   TRIGGER FINGER RELEASE Right    TROCHANTERIC BURSA EXCISION Right 05/03/2016   Dr. Poli, Lonni   WRIST ARTHROSCOPY Right    ligament tear   The following portions of the patient's history were reviewed and updated as appropriate: allergies, current medications, past family history, past medical history, past social history, past surgical history and problem list.   Review of Systems:  Pertinent items noted in HPI and  remainder of comprehensive ROS otherwise negative.  Physical Exam:   General:  Alert, oriented and cooperative.   Mental Status: Normal mood and affect perceived. Normal judgment and thought content.  Physical exam deferred due to nature of the encounter      Assessment and Plan:   Lichen sclerosus - Recommend in person eval for thorough vulvar exam to confirm no concerning lesions. If we do not examine patient, we run the risk of missing or delaying a vulvar cancer diagnosis - She understands this risk, but since she is asymptomatic and hasn't noted any skin changes herself, she would prefer not to come in for an exam. I reviewed that this is reasonable as long as she is accepting of risk which she states she is. - Discussed warning si/sx; also reviewed that we have a mechanical table and staff available to assist with her mobility concerns if problems were to arise - Refill of clobetasol  previously sent  I provided 10 minutes of non-face-to-face time during this encounter.  Kieth Lori Carolin, MD [facility] for [employer], [facility]  "

## 2024-03-05 ENCOUNTER — Telehealth: Payer: Self-pay

## 2024-03-05 NOTE — Telephone Encounter (Signed)
 Patient called the office to notify staff that her insurance will not cover her Actemra  actpen injection starting March 29, 2024. Patient would like to know if we can set her up with some assistants for that medication as soon as possible. Please read and advise.

## 2024-03-06 ENCOUNTER — Telehealth: Payer: Self-pay | Admitting: Internal Medicine

## 2024-03-06 ENCOUNTER — Other Ambulatory Visit (HOSPITAL_COMMUNITY): Payer: Self-pay

## 2024-03-06 NOTE — Telephone Encounter (Signed)
 Patient states the prednisone  is the only thing that helps with the inflammation. She is having wrist pain and inflammation so bad that she is in tears. I advised patient I would call the pharmacy to see if they have the rest of the prednisone  from December since she was prescribed 105 tables that would last.  Patient also wanted to let Dr Jeannetta know that she was approved for Actemra  and would like to get 90 day supply

## 2024-03-06 NOTE — Telephone Encounter (Signed)
 Patient contacted the office to request a medication refill.   1. Name of Medication: Prednisone   2. How are you currently taking this medication (dosage and times per day)? 10 mg / once daily - patient wants to increase to 20 mg / once daily due to her right wrist pain    3. What pharmacy would you like for that to be sent to? [facility] [address] in [place]

## 2024-03-06 NOTE — Telephone Encounter (Signed)
 Submitted a Prior Authorization request to [employer] for ACTEMRA  SQ via CoverMyMeds. Will update once we receive a response.  KeyBETHA DON    Rx last filled 03/02/24  Sherry Pennant, PharmD, MPH, BCPS, CPP Clinical Pharmacist

## 2024-03-06 NOTE — Telephone Encounter (Signed)
 Called the patient to advise that Dr Jeannetta said It was okay to take the 20mg  of prednisone  but a refill should not be needed at this time. Patient said her sister did count the medication and she had enough to get her through the month of January an first part of February.   Patient verbalized understanding.

## 2024-03-06 NOTE — Telephone Encounter (Signed)
 Received notification from [facility] regarding a prior authorization for ACTEMRA  SQ. Authorization has been APPROVED from 03/06/2024 to 02/25/2025. Approval letter sent to scan center.  Patient can continue to fill through [facility]: [phone]   Sherry Pennant, PharmD, MPH, BCPS, CPP Clinical Pharmacist

## 2024-03-12 ENCOUNTER — Ambulatory Visit (INDEPENDENT_AMBULATORY_CARE_PROVIDER_SITE_OTHER): Admitting: Otolaryngology

## 2024-03-12 ENCOUNTER — Encounter (INDEPENDENT_AMBULATORY_CARE_PROVIDER_SITE_OTHER): Payer: Self-pay | Admitting: Otolaryngology

## 2024-03-12 VITALS — BP 131/64 | HR 80 | Ht 67.0 in | Wt 228.0 lb

## 2024-03-12 DIAGNOSIS — K1379 Other lesions of oral mucosa: Secondary | ICD-10-CM | POA: Diagnosis not present

## 2024-03-12 DIAGNOSIS — H6123 Impacted cerumen, bilateral: Secondary | ICD-10-CM

## 2024-03-12 DIAGNOSIS — H903 Sensorineural hearing loss, bilateral: Secondary | ICD-10-CM | POA: Diagnosis not present

## 2024-03-12 DIAGNOSIS — Z87891 Personal history of nicotine dependence: Secondary | ICD-10-CM | POA: Diagnosis not present

## 2024-03-12 NOTE — Progress Notes (Signed)
 Dear Dr. Randeen, Here is my assessment for our mutual patient, Lori Orozco. Thank you for allowing me the opportunity to care for your patient. Please do not hesitate to contact me should you have any other questions. Sincerely, Dr. Eldora Blanch  Otolaryngology Clinic Note Referring provider: Dr. Randeen HPI:  Lori Orozco is a 74 y.o. female kindly referred by Dr. Randeen for evaluation of cerumen impaction and palate lesion  Initial visit (07/2023): Initially noted to have a palate lesion around 2017, saw Dr. Lida. Excisional biopsy performed, which did not reveal carcinoma. Since then has been followed by him intermittently for this and cerumen. Denies any new oral lesions, dysphagia, odynophagia, ear pain, palate numbness, nasal obstruction, epistaxis, unintentional weight loss, changes in voice, shortness of breath, hemoptysis, neck masses. She also reports issues with cerumen bilaterally getting regular cleanings. Intermittent fullness bilateral currently, mild decline in hearing (gradual). Last HT reports about a year ago.  Patient denies: ear pain, vertigo, drainage Patient also denies barotrauma, vestibular suppressant use, ototoxic medication use Prior ear surgery: denies  Tobacco: former, quit 2015 - 86 pack year  --------------------------------------------------------- 03/12/2024 Ears do itch and are full. No other issues with odynophagia or trouble eating. No new oral lesions. She did have an audio   PMHx: RA (on prednisone  10mg  daily), CKD, MDD, HTN, Chronic pain, COPD, Weakness  Independent Review of Additional Tests or Records:  Dr. Randeen (03/06/2023): saw Dr. Lida q6h months to clean ears and check palatal lesion.  Dr. Lida ([facility]) 03/07/2023: noted bilateral impacted cerumen, which was cleaned. 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 Also noted rhinitis; Dx: Palatal lesion, cerumen impaction CBC 07/30/2023: WBC 6.4,  Hgb 11.8; CRP and ESR 07/30/2023: elevated (CRP 26); CMP 07/02/2023: BUN/Cr 36/1.12 02/24/2024 Audiogram was independently reviewed and interpreted by me and it reveals - A/S tymps; normal downsloping to severe symmetric SNHL; WRT >92% AU at 75dB  SNHL= Sensorineural hearing loss   PMH/Meds/All/SocHx/FamHx/ROS:   Past Medical History:  Diagnosis Date   Allergy Hayfever   1958   Amaurosis fugax    Anemia    hx   Anxiety    Arthritis    Asthma    Blood transfusion without reported diagnosis    Cataract    Chronic fatigue syndrome    Chronic kidney disease    frequency, nephritis when 74 yrs old   COPD (chronic obstructive pulmonary disease) (HCC)    COVID-19    Depression    Diverticulitis    Emphysema of lung (HCC)    Fibromyalgia    GERD (gastroesophageal reflux disease)    occ   H/O hiatal hernia    History of bronchitis    Hyperlipidemia    Hypothyroidism    Interstitial cystitis    Irritable bowel syndrome    Lichen sclerosus    Lumbar herniated disc    Migraines    Motor nerve conduction block 11/2021   Osteoporosis 09/10/2022   Pneumonia    PONV (postoperative nausea and vomiting)    Shortness of breath    exertion    Thyroid  disease    Graves   Urinary frequency    Urinary tract infection    Vertigo      Past Surgical History:  Procedure Laterality Date   ABDOMINAL HYSTERECTOMY     bladder sugery      CAROTID DOPPLAR     COLONOSCOPY  05/26/2010   avms- otherwise nl , re check 10y  DEXA-OSTEOPENIA     DOPPLER ECHOCARDIOGRAPHY     elbow surgery     EPICONDYLITIS     EYE SURGERY Bilateral    cataracts   FOOT SURGERY Bilateral    HAND SURGERY Right 09/10/2023   I & D EXTREMITY Right 09/01/2020   Procedure: TYNOSYNOVECTOMY;  Surgeon: Vernetta Lonni GRADE, MD;  Location: [facility] OR;  Service: Orthopedics;  Laterality: Right;   IMPLANTATION VAGAL NERVE STIMULATOR  03/2022   JOINT REPLACEMENT     KNEE SURGERY     Left cartilage   lipoma in second  finger right hand     MOUTH SURGERY  08/22/2022   PLANTAR FASCIA SURGERY Left    REVERSE SHOULDER ARTHROPLASTY Left 02/02/2020   Procedure: LEFT REVERSE SHOULDER ARTHROPLASTY;  Surgeon: Addie Cordella Hamilton, MD;  Location: [facility] OR;  Service: Orthopedics;  Laterality: Left;   ROTATOR CUFF REPAIR Left 12/2017   TEMPOROMANDIBULAR JOINT SURGERY     TONSILLECTOMY     TOTAL HIP ARTHROPLASTY Right 03/20/2013   Procedure: Right TOTAL HIP ARTHROPLASTY;  Surgeon: Jerona LULLA Sage, MD;  Location: [facility] OR;  Service: Orthopedics;  Laterality: Right;  Right Total Hip Arthroplasty   TOTAL KNEE ARTHROPLASTY Left 01/28/2015   Procedure: LEFT TOTAL KNEE ARTHROPLASTY;  Surgeon: Lonni GRADE Vernetta, MD;  Location: [facility];  Service: Orthopedics;  Laterality: Left;   TRIGGER FINGER RELEASE Right    TROCHANTERIC BURSA EXCISION Right 05/03/2016   Dr. Vernetta Lonni   WRIST ARTHROSCOPY Right    ligament tear    Family History  Problem Relation Age of Onset   Arthritis Mother    Hypertension Mother    Kidney disease Mother    Heart disease Mother    Stroke Mother    Cancer Father        Bladder   Alcohol abuse Father    Arthritis Sister    Heart disease Sister    Colon cancer Other    Cancer - Lung Cousin    Arthritis Maternal Grandmother    Hearing loss Maternal Grandfather    Varicose Veins Maternal Uncle      Social Connections: Unknown (08/25/2023)   Social Connection and Isolation Panel    Frequency of Communication with Friends and Family: Once a week    Frequency of Social Gatherings with Friends and Family: Patient declined    Attends Religious Services: Patient declined    Database Administrator or Organizations: No    Attends Engineer, Structural: Not on file    Marital Status: Never married      Current Outpatient Medications:    acetaminophen  (TYLENOL ) 500 MG tablet, Take 500 mg by mouth every 6 (six) hours as needed., Disp: , Rfl:    alendronate  (FOSAMAX ) 70 MG tablet,  TAKE 1 TABLET BY MOUTH ONCE A WEEK. TAKE WITH A FULL GLASS OF WATER  ON AN EMPTY STOMACH., Disp: 12 tablet, Rfl: 0   apixaban  (ELIQUIS ) 2.5 MG TABS tablet, Take 1 tablet (2.5 mg total) by mouth 2 (two) times daily., Disp: 180 tablet, Rfl: 3   Black Cohosh  40 MG CAPS, Take 1 capsule (40 mg total) by mouth daily., Disp: 30 capsule, Rfl: 11   budesonide -glycopyrrolate -formoterol  (BREZTRI  AEROSPHERE) 160-9-4.8 MCG/ACT AERO inhaler, Inhale 2 puffs into the lungs 2 (two) times daily., Disp: 3 each, Rfl: 4   buPROPion  (WELLBUTRIN  XL) 150 MG 24 hr tablet, Take 150 mg by mouth daily., Disp: , Rfl:    busPIRone  (BUSPAR ) 15 MG tablet, Take 15 mg  by mouth 2 (two) times daily before a meal., Disp: , Rfl:    carvedilol  (COREG ) 3.125 MG tablet, TAKE 1 TABLET BY MOUTH TWICE A DAY WITH A MEAL, Disp: 180 tablet, Rfl: 2   cholecalciferol  (VITAMIN D ) 25 MCG (1000 UNIT) tablet, Take 2,000 Units by mouth daily. 2 tablets daily, Disp: , Rfl:    clobetasol  cream (TEMOVATE ) 0.05 %, APPLY SMALL AMOUNT TO AFFECTED AREAS TWO TO THREE TIMES PER WEEK, Disp: 30 g, Rfl: 0   cyanocobalamin  2000 MCG tablet, Take 500 mcg by mouth daily., Disp: , Rfl:    diphenhydrAMINE  (BENADRYL ) 25 MG tablet, Take 25 mg by mouth every 6 (six) hours as needed for allergies., Disp: , Rfl:    divalproex  (DEPAKOTE  ER) 500 MG 24 hr tablet, Take 500 mg by mouth at bedtime. , Disp: , Rfl:    famotidine  (PEPCID ) 20 MG tablet, TAKE 1 TABLET BY MOUTH EVERY DAY, Disp: 90 tablet, Rfl: 1   fluticasone  (FLONASE ) 50 MCG/ACT nasal spray, PLACE 2 SPRAYS INTO THE NOSE DAILY., Disp: 16 g, Rfl: 11   HYDROcodone -acetaminophen  (NORCO/VICODIN) 5-325 MG tablet, Take 1 tablet by mouth 3 (three) times daily as needed for severe pain (pain score 7-10)., Disp: 90 tablet, Rfl: 0   hydrocortisone cream 1 %, Apply 1 Application topically 2 (two) times daily as needed for itching., Disp: , Rfl:    ipratropium-albuterol  (DUONEB) 0.5-2.5 (3) MG/3ML SOLN, Take 3 mLs by nebulization 2  (two) times daily., Disp: , Rfl:    levothyroxine  (SYNTHROID ) 50 MCG tablet, Take 1 tablet (50 mcg total) by mouth daily before breakfast., Disp: 90 tablet, Rfl: 3   LORazepam  (ATIVAN ) 1 MG tablet, Take 1 tablet (1 mg total) by mouth at bedtime as needed for anxiety or sleep., Disp: 7 tablet, Rfl: 0   mirtazapine  (REMERON ) 15 MG tablet, Take 15 mg by mouth at bedtime., Disp: , Rfl:    mirtazapine  (REMERON ) 30 MG tablet, Take 30 mg by mouth at bedtime. , Disp: , Rfl:    MYRBETRIQ  25 MG TB24 tablet, TAKE 1 TABLET BY MOUTH TWICE A DAY, Disp: 180 tablet, Rfl: 1   ondansetron  (ZOFRAN -ODT) 4 MG disintegrating tablet, Take 1 tablet (4 mg total) by mouth every 8 (eight) hours as needed for nausea or vomiting. Caution of sedation, Disp: 20 tablet, Rfl: 0   predniSONE  (DELTASONE ) 10 MG tablet, Take 2 tablets (20 mg total) by mouth daily with breakfast for 15 days, THEN 1 tablet (10 mg total) daily with breakfast., Disp: 105 tablet, Rfl: 0   Rhubarb (ESTROVEN COMPLETE) 4 MG TABS, Take 4 mg by mouth daily., Disp: 30 tablet, Rfl: 11   sertraline  (ZOLOFT ) 100 MG tablet, Take 100 mg by mouth daily. , Disp: , Rfl:    simvastatin  (ZOCOR ) 20 MG tablet, TAKE 1 TABLET BY MOUTH AT BEDTIME, Disp: 90 tablet, Rfl: 1   Spacer/Aero-Holding Chambers (AEROCHAMBER PLUS) inhaler, Use with inhaler, Disp: 1 each, Rfl: 2   Tocilizumab  (ACTEMRA  ACTPEN) 162 MG/0.9ML SOAJ, Inject 162 mg into the skin every 7 (seven) days., Disp: 10.8 mL, Rfl: 0   ketotifen  (ZADITOR ) 0.025 % ophthalmic solution, Place 1 drop into both eyes 2 (two) times daily as needed (allergies). (Patient not taking: Reported on 03/12/2024), Disp: , Rfl:    Physical Exam:   BP 131/64 (BP Location: Right Wrist, Patient Position: Sitting, Cuff Size: Large)   Pulse 80   Ht 5' 7 (1.702 m)   Wt 228 lb (103.4 kg)   SpO2 (!) 82%  BMI 35.71 kg/m   Salient findings:  CN II-XII intact Given history and complaints, ear microscopy was indicated and performed for  evaluation with findings as below in physical exam section and in procedures; bilateral cerumen impaction, after clearance, Bilateral EAC clear and TM intact with well pneumatized middle ear spaces Anterior rhinoscopy: Septum relatively midline; bilateral inferior turbinates without significant hypertrophy No lesions of oral cavity/oropharynx - well healed mid-palate lesion, no numbness. No other oral lesions noted. Stable No obviously palpable neck masses/lymphadenopathy/thyromegaly No respiratory distress or stridor  Seprately Identifiable Procedures:  Prior to initiating any procedures, risks/benefits/alternatives were explained to the patient and verbal consent obtained. Procedure: Bilateral ear microscopy and cerumen removal using microscope (CPT [phone]) - Mod 25 Pre-procedure diagnosis: Cerumen impaction bilateral external ears Post-procedure diagnosis: same Indication: bilateral cerumen impaction; given patient's otologic complaints and history as well as for improved and comprehensive examination of external ear and tympanic membrane, bilateral otologic examination using microscope was performed and impacted cerumen removed  Procedure: Patient was placed semi-recumbent. Both ear canals were examined using the microscope with findings above. Impacted Cerumen removed on left and on right using suction and currette with improvement in EAC examination and patency. Patient tolerated the procedure well.      Impression & Plans:  Kera Deacon is a 74 y.o. female with:  1. Sensorineural hearing loss, bilateral   2. Lesion of palate   3. Bilateral impacted cerumen    Well healed palate, lesion excised around 8 years ago per records. Not carcinoma per Dr. Coit notes. Would recommend no intervention. Stable Cerumen cleaned today and ceruminosis management strategies discussed.  Audio with SNHL and we discussed HA but she would like to hold off  F/u in 6 months, sooner as  necessary  See below regarding exact medications prescribed this encounter including dosages and route: No orders of the defined types were placed in this encounter.     Thank you for allowing me the opportunity to care for your patient. Please do not hesitate to contact me should you have any other questions.  Sincerely, Eldora Blanch, MD Otolaryngologist (ENT), [facility] Phone: [phone] Fax: [phone]  03/12/2024, 1:56 PM   MDM:  [id number] Complexity/Problems addressed: mod - multiple chronic problems Data complexity: low - Morbidity: low

## 2024-03-19 ENCOUNTER — Ambulatory Visit: Admitting: Orthopedic Surgery

## 2024-03-19 ENCOUNTER — Other Ambulatory Visit: Payer: Self-pay

## 2024-03-19 DIAGNOSIS — M545 Low back pain, unspecified: Secondary | ICD-10-CM

## 2024-03-19 DIAGNOSIS — G8929 Other chronic pain: Secondary | ICD-10-CM | POA: Diagnosis not present

## 2024-03-19 NOTE — Progress Notes (Signed)
 Orthopedic Spine Surgery Office Note  Assessment: Patient is a 74 y.o. female with chronic, progressive low back pain.  No radicular symptoms   Plan: -Explained that initially conservative treatment is tried as a significant number of patients may experience relief with these treatment modalities. Discussed that the conservative treatments include:  -activity modification  -physical therapy  -over the counter pain medications  -medrol  dosepak  -lumbar steroid injections -Patient has tried Tylenol , Norco, prednisone , lumbar steroid injection - Patient has tried greater than 3 months of treatment without any improvement in her pain, so recommended SPECT scan to evaluate for discogenic back pain -Patient should return to office in 8 weeks, x-rays at next visit: none   Patient expressed understanding of the plan and all questions were answered to the patient's satisfaction.   ___________________________________________________________________________   History:  Patient is a 74 y.o. female who presents today for lumbar spine.  Patient was previously seen in the office for low back pain that radiated into the lower extremity.  She got an injection that resolved her lower extremity pain.  She comes back in today because she has had progressively worsening back pain.  She notes the pain in the lower lumbar region.  She has no pain radiating into either lower extremity.  She has had this pain for many years but it continues to get worse.  She said she has difficulty doing any activity for more than 10 minutes as result of this pain.  There was no recent trauma or injury that has caused it to worsen.  No bowel or bladder incontinence.  No saddle anesthesia.  Treatments tried: Tylenol , Norco, prednisone , lumbar steroid injection   Physical Exam:  BMI of 35.7  General: no acute distress, appears stated age Neurologic: alert, answering questions appropriately, following commands Respiratory:  unlabored breathing on room air, symmetric chest rise Psychiatric: appropriate affect, normal cadence to speech   MSK (spine):  -Strength exam      Left  Right EHL    5/5  5/5 TA    5/5  5/5 GSC    5/5  5/5 Knee extension  5/5  5/5 Hip flexion   5/5  5/5  -Sensory exam    Sensation intact to light touch in L3-S1 nerve distributions of bilateral lower extremities  -Straight leg raise: Negative bilaterally -Clonus: no beats bilaterally  -Left hip exam: No pain through range of motion -Right hip exam: No pain through range of motion  Imaging: XRs of the lumbar spine from 03/19/2024 were independently reviewed and interpreted, showing disc height loss at L4/5 and L5/S1.  Chronic appearing fracture seen at T12.  No other fracture seen.  No dislocation seen.  No evidence of instability on flexion/tension views.  There is a stimulator device in the soft tissue superficial to the lumbar spine with leads going cranially towards the thoracic spine.  MRI of the lumbar spine from 08/25/2022 was previously independently reviewed and interpreted, showing L5 inferior endplate fracture that appears subacute. Chronic T12 compression fracture is noted. Laminectomy defect at T12/L1. Right L5/S1 foraminal stenosis. No other significant stenosis seen. Significant fatty atrophy of her paraspinal muscles.    Patient name: Lori Orozco Patient MRN: [id number] Date of visit: 03/19/24   I spent 20 minutes in the room with the patient going over her symptoms.  We discussed the unpredictable nature of fusion type surgery for discogenic back pain.  I told her that a SPECT scan can be used to help workup discogenic back  pain but there is still a substantial risk of persistent pain even if we were decide on surgery at the levels that show increased radiotracer uptake.  She has tried narcotics, spinal cord stimulator, other medications without any relief.  She understands that this may not help her pain and  that she is at the point where she is wanting to pursue any kind of treatment.  We talked about what this scan can do and why I would like it.  I also used at this time to perform a exam.

## 2024-03-20 ENCOUNTER — Other Ambulatory Visit: Payer: Self-pay

## 2024-03-23 ENCOUNTER — Other Ambulatory Visit: Payer: Self-pay

## 2024-03-25 ENCOUNTER — Other Ambulatory Visit: Payer: Self-pay | Admitting: Pharmacy Technician

## 2024-03-25 ENCOUNTER — Other Ambulatory Visit: Payer: Self-pay

## 2024-03-25 NOTE — Progress Notes (Signed)
 Specialty Pharmacy Refill Coordination Note  Lori Orozco is a 74 y.o. female contacted today regarding refills of specialty medication(s) Tocilizumab  (Actemra  ACTPen)   Patient requested Delivery   Delivery date: 03/26/24   Verified address: [address]  [place]   Medication will be filled on: 03/25/24

## 2024-03-30 ENCOUNTER — Other Ambulatory Visit

## 2024-04-07 ENCOUNTER — Ambulatory Visit

## 2024-05-12 ENCOUNTER — Ambulatory Visit: Admitting: Internal Medicine

## 2024-05-14 ENCOUNTER — Ambulatory Visit: Admitting: Orthopedic Surgery

## 2024-07-16 ENCOUNTER — Ambulatory Visit

## 2024-07-21 ENCOUNTER — Encounter: Admitting: Family Medicine

## 2024-07-24 ENCOUNTER — Ambulatory Visit

## 2024-09-24 ENCOUNTER — Ambulatory Visit (INDEPENDENT_AMBULATORY_CARE_PROVIDER_SITE_OTHER): Admitting: Otolaryngology
